# Patient Record
Sex: Male | Born: 1945 | Race: Black or African American | Hispanic: No | Marital: Single | Smoking: Former smoker
Health system: Southern US, Community
[De-identification: ages and names within clinical notes are randomized; demographics above are authoritative.]

## PROBLEM LIST (undated history)

## (undated) DIAGNOSIS — Z9289 Personal history of other medical treatment: Secondary | ICD-10-CM

## (undated) DIAGNOSIS — M1990 Unspecified osteoarthritis, unspecified site: Secondary | ICD-10-CM

## (undated) DIAGNOSIS — A419 Sepsis, unspecified organism: Secondary | ICD-10-CM

## (undated) DIAGNOSIS — N189 Chronic kidney disease, unspecified: Secondary | ICD-10-CM

## (undated) DIAGNOSIS — M069 Rheumatoid arthritis, unspecified: Secondary | ICD-10-CM

## (undated) DIAGNOSIS — D649 Anemia, unspecified: Secondary | ICD-10-CM

## (undated) DIAGNOSIS — C8310 Mantle cell lymphoma, unspecified site: Secondary | ICD-10-CM

## (undated) DIAGNOSIS — I10 Essential (primary) hypertension: Secondary | ICD-10-CM

## (undated) HISTORY — PX: FRACTURE SURGERY: SHX138

## (undated) HISTORY — PX: PORTACATH PLACEMENT: SHX2246

## (undated) HISTORY — PX: BACK SURGERY: SHX140

---

## 2009-05-21 ENCOUNTER — Emergency Department: Payer: Self-pay | Admitting: Unknown Physician Specialty

## 2012-04-27 ENCOUNTER — Ambulatory Visit: Payer: Self-pay | Admitting: Specialist

## 2012-11-03 ENCOUNTER — Emergency Department: Payer: Self-pay | Admitting: Unknown Physician Specialty

## 2012-11-03 LAB — CBC
HCT: 34.8 % — ABNORMAL LOW (ref 40.0–52.0)
HGB: 12.1 g/dL — ABNORMAL LOW (ref 13.0–18.0)
MCHC: 34.7 g/dL (ref 32.0–36.0)
Platelet: 299 10*3/uL (ref 150–440)
RBC: 4.07 10*6/uL — ABNORMAL LOW (ref 4.40–5.90)
RDW: 19.2 % — ABNORMAL HIGH (ref 11.5–14.5)
WBC: 8.5 10*3/uL (ref 3.8–10.6)

## 2012-11-03 LAB — BASIC METABOLIC PANEL
Co2: 31 mmol/L (ref 21–32)
EGFR (African American): >60
Glucose: 110 mg/dL — ABNORMAL HIGH (ref 65–99)

## 2013-02-23 ENCOUNTER — Ambulatory Visit: Payer: Self-pay | Admitting: Family Medicine

## 2013-02-23 LAB — CREATININE, SERUM
Creatinine: 0.97 mg/dL (ref 0.60–1.30)
EGFR (Non-African Amer.): >60

## 2013-02-26 ENCOUNTER — Ambulatory Visit: Payer: Self-pay | Admitting: Hematology and Oncology

## 2013-02-26 LAB — COMPREHENSIVE METABOLIC PANEL
Albumin: 2.5 g/dL — ABNORMAL LOW (ref 3.4–5.0)
Alkaline Phosphatase: 81 U/L (ref 50–136)
Anion Gap: 9 (ref 7–16)
BUN: 12 mg/dL (ref 7–18)
Bilirubin,Total: 0.3 mg/dL (ref 0.2–1.0)
Calcium, Total: 8.4 mg/dL — ABNORMAL LOW (ref 8.5–10.1)
Co2: 31 mmol/L (ref 21–32)
Creatinine: 0.95 mg/dL (ref 0.60–1.30)
EGFR (African American): >60
Glucose: 83 mg/dL (ref 65–99)
SGOT(AST): 19 U/L (ref 15–37)
SGPT (ALT): 12 U/L (ref 12–78)
Sodium: 139 mmol/L (ref 136–145)
Total Protein: 9.3 g/dL — ABNORMAL HIGH (ref 6.4–8.2)

## 2013-02-26 LAB — CBC CANCER CENTER
HGB: 10.2 g/dL — ABNORMAL LOW (ref 13.0–18.0)
Lymphocytes: 27 %
MCH: 26.8 pg (ref 26.0–34.0)
MCHC: 33.2 g/dL (ref 32.0–36.0)
Segmented Neutrophils: 38 %

## 2013-02-26 LAB — LACTATE DEHYDROGENASE: LDH: 217 U/L (ref 85–241)

## 2013-02-28 ENCOUNTER — Ambulatory Visit: Payer: Self-pay | Admitting: Hematology and Oncology

## 2013-03-08 ENCOUNTER — Ambulatory Visit: Payer: Self-pay | Admitting: Vascular Surgery

## 2013-03-13 ENCOUNTER — Ambulatory Visit: Payer: Self-pay | Admitting: Unknown Physician Specialty

## 2013-03-14 ENCOUNTER — Ambulatory Visit: Payer: Self-pay | Admitting: Hematology and Oncology

## 2013-03-15 LAB — CBC CANCER CENTER
Basophil %: 0.2 %
Eosinophil #: 0.2 x10 3/mm (ref 0.0–0.7)
Eosinophil %: 2.5 %
HCT: 31.2 % — ABNORMAL LOW (ref 40.0–52.0)
Lymphocyte %: 53.4 %
MCH: 28.2 pg (ref 26.0–34.0)
MCHC: 33.7 g/dL (ref 32.0–36.0)
MCV: 84 fL (ref 80–100)
Monocyte #: 0.7 x10 3/mm (ref 0.2–1.0)
Monocyte %: 7.8 %
Neutrophil %: 36.1 %
Platelet: 111 x10 3/mm — ABNORMAL LOW (ref 150–440)

## 2013-03-15 LAB — COMPREHENSIVE METABOLIC PANEL
Albumin: 2.5 g/dL — ABNORMAL LOW (ref 3.4–5.0)
Alkaline Phosphatase: 86 U/L (ref 50–136)
Bilirubin,Total: 0.4 mg/dL (ref 0.2–1.0)
Chloride: 101 mmol/L (ref 98–107)
Co2: 29 mmol/L (ref 21–32)
EGFR (African American): >60
EGFR (Non-African Amer.): >60
Osmolality: 272 (ref 275–301)
SGOT(AST): 19 U/L (ref 15–37)
Sodium: 137 mmol/L (ref 136–145)
Total Protein: 8.1 g/dL (ref 6.4–8.2)

## 2013-03-27 LAB — CBC CANCER CENTER
Basophil #: 0.1 x10 3/mm (ref 0.0–0.1)
Basophil %: 0.8 %
Eosinophil #: 0.7 x10 3/mm (ref 0.0–0.7)
Eosinophil %: 9 %
HCT: 32.1 % — ABNORMAL LOW (ref 40.0–52.0)
HGB: 10.9 g/dL — ABNORMAL LOW (ref 13.0–18.0)
Lymphocyte #: 0.8 x10 3/mm — ABNORMAL LOW (ref 1.0–3.6)
Lymphocyte %: 11.3 %
MCH: 28.5 pg (ref 26.0–34.0)
MCHC: 34 g/dL (ref 32.0–36.0)
MCV: 84 fL (ref 80–100)
Monocyte #: 1.3 x10 3/mm — ABNORMAL HIGH (ref 0.2–1.0)
Monocyte %: 17.3 %
Neutrophil #: 4.6 x10 3/mm (ref 1.4–6.5)
Neutrophil %: 61.6 %
Platelet: 228 x10 3/mm (ref 150–440)
RBC: 3.83 10*6/uL — ABNORMAL LOW (ref 4.40–5.90)
RDW: 19.1 % — ABNORMAL HIGH (ref 11.5–14.5)
WBC: 7.4 x10 3/mm (ref 3.8–10.6)

## 2013-04-05 LAB — COMPREHENSIVE METABOLIC PANEL
Alkaline Phosphatase: 102 U/L (ref 50–136)
Anion Gap: 8 (ref 7–16)
Calcium, Total: 8.2 mg/dL — ABNORMAL LOW (ref 8.5–10.1)
Chloride: 102 mmol/L (ref 98–107)
Co2: 30 mmol/L (ref 21–32)
EGFR (African American): >60
Glucose: 111 mg/dL — ABNORMAL HIGH (ref 65–99)
Potassium: 3.3 mmol/L — ABNORMAL LOW (ref 3.5–5.1)
SGOT(AST): 16 U/L (ref 15–37)
SGPT (ALT): 16 U/L (ref 12–78)
Total Protein: 7.8 g/dL (ref 6.4–8.2)

## 2013-04-05 LAB — CBC CANCER CENTER
Basophil #: 0.1 x10 3/mm (ref 0.0–0.1)
Basophil %: 0.9 %
Eosinophil #: 0.8 x10 3/mm — ABNORMAL HIGH (ref 0.0–0.7)
Eosinophil %: 10.7 %
HGB: 11.1 g/dL — ABNORMAL LOW (ref 13.0–18.0)
Lymphocyte #: 1.1 x10 3/mm (ref 1.0–3.6)
Lymphocyte %: 15.4 %
MCHC: 34.1 g/dL (ref 32.0–36.0)
MCV: 84 fL (ref 80–100)
Monocyte #: 1.1 x10 3/mm — ABNORMAL HIGH (ref 0.2–1.0)
Monocyte %: 15.9 %
Neutrophil #: 4 x10 3/mm (ref 1.4–6.5)
Neutrophil %: 57.1 %
Platelet: 179 x10 3/mm (ref 150–440)
RDW: 18.1 % — ABNORMAL HIGH (ref 11.5–14.5)
WBC: 7.1 x10 3/mm (ref 3.8–10.6)

## 2013-04-14 ENCOUNTER — Ambulatory Visit: Payer: Self-pay | Admitting: Hematology and Oncology

## 2013-04-23 ENCOUNTER — Emergency Department: Payer: Self-pay | Admitting: Emergency Medicine

## 2013-04-23 LAB — CBC
HCT: 31.4 % — ABNORMAL LOW (ref 40.0–52.0)
HGB: 10.8 g/dL — ABNORMAL LOW (ref 13.0–18.0)
MCHC: 34.4 g/dL (ref 32.0–36.0)
MCV: 84 fL (ref 80–100)
Platelet: 230 10*3/uL (ref 150–440)
RBC: 3.75 10*6/uL — ABNORMAL LOW (ref 4.40–5.90)
WBC: 4.4 10*3/uL (ref 3.8–10.6)

## 2013-04-23 LAB — CK TOTAL AND CKMB (NOT AT ARMC): CK-MB: 1 ng/mL (ref 0.5–3.6)

## 2013-04-23 LAB — COMPREHENSIVE METABOLIC PANEL
Albumin: 2.4 g/dL — ABNORMAL LOW (ref 3.4–5.0)
BUN: 7 mg/dL (ref 7–18)
Calcium, Total: 8.6 mg/dL (ref 8.5–10.1)
Chloride: 99 mmol/L (ref 98–107)
Co2: 34 mmol/L — ABNORMAL HIGH (ref 21–32)
Creatinine: 0.91 mg/dL (ref 0.60–1.30)
EGFR (African American): >60
Glucose: 102 mg/dL — ABNORMAL HIGH (ref 65–99)
Potassium: 2.7 mmol/L — ABNORMAL LOW (ref 3.5–5.1)
Sodium: 137 mmol/L (ref 136–145)
Total Protein: 7.9 g/dL (ref 6.4–8.2)

## 2013-04-23 LAB — TROPONIN I: Troponin-I: <0.02 ng/mL

## 2013-04-23 LAB — URINALYSIS, COMPLETE
Bacteria: NONE SEEN
Glucose,UR: NEGATIVE mg/dL (ref 0–75)
Ketone: NEGATIVE
Leukocyte Esterase: NEGATIVE
RBC,UR: 1 /HPF (ref 0–5)
Specific Gravity: 1.005 (ref 1.003–1.030)
Squamous Epithelial: 1
WBC UR: 4 /HPF (ref 0–5)

## 2013-05-03 LAB — CBC CANCER CENTER
Basophil #: 0 x10 3/mm (ref 0.0–0.1)
Basophil %: 1 %
Eosinophil #: 0.2 x10 3/mm (ref 0.0–0.7)
MCH: 27.8 pg (ref 26.0–34.0)
MCHC: 33.9 g/dL (ref 32.0–36.0)
MCV: 82 fL (ref 80–100)
Monocyte %: 25.1 %
Neutrophil #: 1.9 x10 3/mm (ref 1.4–6.5)
Neutrophil %: 39.3 %
Platelet: 225 x10 3/mm (ref 150–440)
RDW: 18 % — ABNORMAL HIGH (ref 11.5–14.5)

## 2013-05-03 LAB — COMPREHENSIVE METABOLIC PANEL
Alkaline Phosphatase: 90 U/L (ref 50–136)
Co2: 35 mmol/L — ABNORMAL HIGH (ref 21–32)
EGFR (African American): >60
EGFR (Non-African Amer.): >60
Glucose: 143 mg/dL — ABNORMAL HIGH (ref 65–99)
Osmolality: 301 (ref 275–301)
Sodium: 150 mmol/L — ABNORMAL HIGH (ref 136–145)

## 2013-05-03 LAB — MAGNESIUM: Magnesium: 2.1 mg/dL

## 2013-05-14 ENCOUNTER — Ambulatory Visit: Payer: Self-pay | Admitting: Hematology and Oncology

## 2013-05-24 LAB — CBC CANCER CENTER
Basophil #: 0.1 x10 3/mm (ref 0.0–0.1)
Basophil %: 1 %
Eosinophil #: 0.7 x10 3/mm (ref 0.0–0.7)
Eosinophil %: 10.5 %
HCT: 34 % — ABNORMAL LOW (ref 40.0–52.0)
HGB: 11.4 g/dL — ABNORMAL LOW (ref 13.0–18.0)
Lymphocyte %: 26.8 %
MCV: 86 fL (ref 80–100)
Monocyte #: 1.4 x10 3/mm — ABNORMAL HIGH (ref 0.2–1.0)
Neutrophil #: 2.9 x10 3/mm (ref 1.4–6.5)
Platelet: 182 x10 3/mm (ref 150–440)
RBC: 3.96 10*6/uL — ABNORMAL LOW (ref 4.40–5.90)
RDW: 19.2 % — ABNORMAL HIGH (ref 11.5–14.5)
WBC: 6.9 x10 3/mm (ref 3.8–10.6)

## 2013-05-24 LAB — COMPREHENSIVE METABOLIC PANEL
Anion Gap: 6 — ABNORMAL LOW (ref 7–16)
Calcium, Total: 9 mg/dL (ref 8.5–10.1)
Chloride: 103 mmol/L (ref 98–107)
Co2: 29 mmol/L (ref 21–32)
Creatinine: 0.98 mg/dL (ref 0.60–1.30)
EGFR (African American): >60
EGFR (Non-African Amer.): >60
Osmolality: 277 (ref 275–301)
Potassium: 4.1 mmol/L (ref 3.5–5.1)
Sodium: 138 mmol/L (ref 136–145)
Total Protein: 7.9 g/dL (ref 6.4–8.2)

## 2013-06-14 ENCOUNTER — Ambulatory Visit: Payer: Self-pay | Admitting: Hematology and Oncology

## 2013-06-21 LAB — COMPREHENSIVE METABOLIC PANEL
ALK PHOS: 75 U/L
Albumin: 2.9 g/dL — ABNORMAL LOW (ref 3.4–5.0)
Anion Gap: 7 (ref 7–16)
BUN: 6 mg/dL — ABNORMAL LOW (ref 7–18)
Bilirubin,Total: 0.3 mg/dL (ref 0.2–1.0)
CALCIUM: 8.8 mg/dL (ref 8.5–10.1)
CO2: 27 mmol/L (ref 21–32)
Chloride: 103 mmol/L (ref 98–107)
Creatinine: 0.92 mg/dL (ref 0.60–1.30)
EGFR (African American): >60
Glucose: 105 mg/dL — ABNORMAL HIGH (ref 65–99)
Osmolality: 272 (ref 275–301)
Potassium: 3.7 mmol/L (ref 3.5–5.1)
SGOT(AST): 17 U/L (ref 15–37)
SGPT (ALT): 12 U/L (ref 12–78)
SODIUM: 137 mmol/L (ref 136–145)
Total Protein: 7.8 g/dL (ref 6.4–8.2)

## 2013-06-21 LAB — CBC CANCER CENTER
Basophil #: 0.1 x10 3/mm (ref 0.0–0.1)
Basophil %: 0.9 %
Eosinophil #: 0.4 x10 3/mm (ref 0.0–0.7)
Eosinophil %: 6.1 %
HCT: 35.2 % — ABNORMAL LOW (ref 40.0–52.0)
HGB: 11.7 g/dL — ABNORMAL LOW (ref 13.0–18.0)
LYMPHS PCT: 28 %
Lymphocyte #: 1.6 x10 3/mm (ref 1.0–3.6)
MCH: 28.9 pg (ref 26.0–34.0)
MCHC: 33.1 g/dL (ref 32.0–36.0)
MCV: 87 fL (ref 80–100)
MONO ABS: 0.5 x10 3/mm (ref 0.2–1.0)
Monocyte %: 8.2 %
NEUTROS PCT: 56.8 %
Neutrophil #: 3.3 x10 3/mm (ref 1.4–6.5)
Platelet: 143 x10 3/mm — ABNORMAL LOW (ref 150–440)
RBC: 4.03 10*6/uL — ABNORMAL LOW (ref 4.40–5.90)
RDW: 17.9 % — AB (ref 11.5–14.5)
WBC: 5.8 x10 3/mm (ref 3.8–10.6)

## 2013-07-12 LAB — COMPREHENSIVE METABOLIC PANEL
Albumin: 2.9 g/dL — ABNORMAL LOW (ref 3.4–5.0)
Alkaline Phosphatase: 74 U/L
Anion Gap: 8 (ref 7–16)
BILIRUBIN TOTAL: 0.2 mg/dL (ref 0.2–1.0)
BUN: 11 mg/dL (ref 7–18)
CALCIUM: 8.3 mg/dL — AB (ref 8.5–10.1)
CREATININE: 1.02 mg/dL (ref 0.60–1.30)
Chloride: 103 mmol/L (ref 98–107)
Co2: 27 mmol/L (ref 21–32)
EGFR (African American): >60
EGFR (Non-African Amer.): >60
Glucose: 87 mg/dL (ref 65–99)
OSMOLALITY: 274 (ref 275–301)
Potassium: 4 mmol/L (ref 3.5–5.1)
SGOT(AST): 15 U/L (ref 15–37)
SGPT (ALT): 11 U/L — ABNORMAL LOW (ref 12–78)
Sodium: 138 mmol/L (ref 136–145)
Total Protein: 8.1 g/dL (ref 6.4–8.2)

## 2013-07-12 LAB — CBC CANCER CENTER
Basophil #: 0 x10 3/mm (ref 0.0–0.1)
Basophil %: 0.7 %
EOS ABS: 0.5 x10 3/mm (ref 0.0–0.7)
EOS PCT: 7.8 %
HCT: 34.5 % — ABNORMAL LOW (ref 40.0–52.0)
HGB: 11.6 g/dL — ABNORMAL LOW (ref 13.0–18.0)
LYMPHS ABS: 1.8 x10 3/mm (ref 1.0–3.6)
Lymphocyte %: 26.2 %
MCH: 29.1 pg (ref 26.0–34.0)
MCHC: 33.6 g/dL (ref 32.0–36.0)
MCV: 87 fL (ref 80–100)
Monocyte #: 1.3 x10 3/mm — ABNORMAL HIGH (ref 0.2–1.0)
Monocyte %: 19.1 %
NEUTROS ABS: 3.1 x10 3/mm (ref 1.4–6.5)
NEUTROS PCT: 46.2 %
Platelet: 142 x10 3/mm — ABNORMAL LOW (ref 150–440)
RBC: 3.99 10*6/uL — ABNORMAL LOW (ref 4.40–5.90)
RDW: 17.7 % — AB (ref 11.5–14.5)
WBC: 6.8 x10 3/mm (ref 3.8–10.6)

## 2013-07-15 ENCOUNTER — Ambulatory Visit: Payer: Self-pay | Admitting: Hematology and Oncology

## 2013-08-02 LAB — COMPREHENSIVE METABOLIC PANEL
ALBUMIN: 3.3 g/dL — AB (ref 3.4–5.0)
ALK PHOS: 75 U/L
ANION GAP: 8 (ref 7–16)
BILIRUBIN TOTAL: 0.2 mg/dL (ref 0.2–1.0)
BUN: 15 mg/dL (ref 7–18)
CO2: 28 mmol/L (ref 21–32)
Calcium, Total: 8.2 mg/dL — ABNORMAL LOW (ref 8.5–10.1)
Chloride: 102 mmol/L (ref 98–107)
Creatinine: 0.99 mg/dL (ref 0.60–1.30)
GLUCOSE: 112 mg/dL — AB (ref 65–99)
Osmolality: 277 (ref 275–301)
Potassium: 4.2 mmol/L (ref 3.5–5.1)
SGOT(AST): 16 U/L (ref 15–37)
SGPT (ALT): 14 U/L (ref 12–78)
Sodium: 138 mmol/L (ref 136–145)
Total Protein: 7.9 g/dL (ref 6.4–8.2)

## 2013-08-02 LAB — CBC CANCER CENTER
BASOS ABS: 0 x10 3/mm (ref 0.0–0.1)
Basophil %: 0.6 %
EOS ABS: 0.5 x10 3/mm (ref 0.0–0.7)
EOS PCT: 8.4 %
HCT: 36.7 % — ABNORMAL LOW (ref 40.0–52.0)
HGB: 12 g/dL — ABNORMAL LOW (ref 13.0–18.0)
LYMPHS PCT: 24.1 %
Lymphocyte #: 1.5 x10 3/mm (ref 1.0–3.6)
MCH: 28.3 pg (ref 26.0–34.0)
MCHC: 32.7 g/dL (ref 32.0–36.0)
MCV: 87 fL (ref 80–100)
MONO ABS: 1.2 x10 3/mm — AB (ref 0.2–1.0)
MONOS PCT: 18.6 %
NEUTROS ABS: 3.1 x10 3/mm (ref 1.4–6.5)
Neutrophil %: 48.3 %
PLATELETS: 127 x10 3/mm — AB (ref 150–440)
RBC: 4.24 10*6/uL — ABNORMAL LOW (ref 4.40–5.90)
RDW: 18.5 % — ABNORMAL HIGH (ref 11.5–14.5)
WBC: 6.3 x10 3/mm (ref 3.8–10.6)

## 2013-08-08 ENCOUNTER — Ambulatory Visit: Payer: Self-pay | Admitting: Hematology and Oncology

## 2013-08-12 ENCOUNTER — Ambulatory Visit: Payer: Self-pay | Admitting: Hematology and Oncology

## 2013-08-30 LAB — CBC CANCER CENTER
Basophil #: 0 x10 3/mm (ref 0.0–0.1)
Basophil %: 0.5 %
EOS PCT: 3.7 %
Eosinophil #: 0.2 x10 3/mm (ref 0.0–0.7)
HCT: 32 % — ABNORMAL LOW (ref 40.0–52.0)
HGB: 10.6 g/dL — AB (ref 13.0–18.0)
LYMPHS ABS: 1 x10 3/mm (ref 1.0–3.6)
Lymphocyte %: 23.1 %
MCH: 28.3 pg (ref 26.0–34.0)
MCHC: 33.3 g/dL (ref 32.0–36.0)
MCV: 85 fL (ref 80–100)
Monocyte #: 0.4 x10 3/mm (ref 0.2–1.0)
Monocyte %: 8.6 %
Neutrophil #: 2.8 x10 3/mm (ref 1.4–6.5)
Neutrophil %: 64.1 %
Platelet: 164 x10 3/mm (ref 150–440)
RBC: 3.76 10*6/uL — ABNORMAL LOW (ref 4.40–5.90)
RDW: 18.2 % — ABNORMAL HIGH (ref 11.5–14.5)
WBC: 4.3 x10 3/mm (ref 3.8–10.6)

## 2013-08-30 LAB — COMPREHENSIVE METABOLIC PANEL
ALBUMIN: 2.7 g/dL — AB (ref 3.4–5.0)
ANION GAP: 6 — AB (ref 7–16)
AST: 23 U/L (ref 15–37)
Alkaline Phosphatase: 71 U/L
BUN: 11 mg/dL (ref 7–18)
Bilirubin,Total: 0.2 mg/dL (ref 0.2–1.0)
CALCIUM: 8.4 mg/dL — AB (ref 8.5–10.1)
CHLORIDE: 100 mmol/L (ref 98–107)
CO2: 33 mmol/L — AB (ref 21–32)
Creatinine: 1.1 mg/dL (ref 0.60–1.30)
EGFR (Non-African Amer.): >60
Glucose: 147 mg/dL — ABNORMAL HIGH (ref 65–99)
OSMOLALITY: 280 (ref 275–301)
POTASSIUM: 2.4 mmol/L — AB (ref 3.5–5.1)
SGPT (ALT): 11 U/L — ABNORMAL LOW (ref 12–78)
Sodium: 139 mmol/L (ref 136–145)
Total Protein: 7.7 g/dL (ref 6.4–8.2)

## 2013-09-12 ENCOUNTER — Ambulatory Visit: Payer: Self-pay | Admitting: Hematology and Oncology

## 2013-10-01 LAB — CBC CANCER CENTER
BASOS ABS: 0 x10 3/mm (ref 0.0–0.1)
Basophil %: 0.7 %
Eosinophil #: 0.2 x10 3/mm (ref 0.0–0.7)
Eosinophil %: 4.6 %
HCT: 30.6 % — AB (ref 40.0–52.0)
HGB: 10.1 g/dL — AB (ref 13.0–18.0)
Lymphocyte #: 1.2 x10 3/mm (ref 1.0–3.6)
Lymphocyte %: 27.9 %
MCH: 27 pg (ref 26.0–34.0)
MCHC: 33.1 g/dL (ref 32.0–36.0)
MCV: 82 fL (ref 80–100)
MONO ABS: 0.4 x10 3/mm (ref 0.2–1.0)
MONOS PCT: 8.2 %
NEUTROS ABS: 2.6 x10 3/mm (ref 1.4–6.5)
Neutrophil %: 58.6 %
Platelet: 153 x10 3/mm (ref 150–440)
RBC: 3.75 10*6/uL — AB (ref 4.40–5.90)
RDW: 22.5 % — ABNORMAL HIGH (ref 11.5–14.5)
WBC: 4.5 x10 3/mm (ref 3.8–10.6)

## 2013-10-01 LAB — COMPREHENSIVE METABOLIC PANEL
ALBUMIN: 2.5 g/dL — AB (ref 3.4–5.0)
ALT: 10 U/L — AB (ref 12–78)
AST: 20 U/L (ref 15–37)
Alkaline Phosphatase: 69 U/L
Anion Gap: 9 (ref 7–16)
BUN: 14 mg/dL (ref 7–18)
Bilirubin,Total: 0.3 mg/dL (ref 0.2–1.0)
CHLORIDE: 100 mmol/L (ref 98–107)
CREATININE: 1.12 mg/dL (ref 0.60–1.30)
Calcium, Total: 8.8 mg/dL (ref 8.5–10.1)
Co2: 28 mmol/L (ref 21–32)
EGFR (African American): >60
Glucose: 154 mg/dL — ABNORMAL HIGH (ref 65–99)
Osmolality: 277 (ref 275–301)
POTASSIUM: 3.8 mmol/L (ref 3.5–5.1)
Sodium: 137 mmol/L (ref 136–145)
Total Protein: 8 g/dL (ref 6.4–8.2)

## 2013-10-12 ENCOUNTER — Ambulatory Visit: Payer: Self-pay | Admitting: Hematology and Oncology

## 2013-10-17 LAB — CBC CANCER CENTER
BASOS ABS: 0 x10 3/mm (ref 0.0–0.1)
Basophil %: 0.3 %
Eosinophil #: 0.3 x10 3/mm (ref 0.0–0.7)
Eosinophil %: 5.8 %
HCT: 30.2 % — ABNORMAL LOW (ref 40.0–52.0)
HGB: 10.1 g/dL — ABNORMAL LOW (ref 13.0–18.0)
LYMPHS PCT: 35.3 %
Lymphocyte #: 1.8 x10 3/mm (ref 1.0–3.6)
MCH: 26.7 pg (ref 26.0–34.0)
MCHC: 33.4 g/dL (ref 32.0–36.0)
MCV: 80 fL (ref 80–100)
Monocyte #: 0.7 x10 3/mm (ref 0.2–1.0)
Monocyte %: 14.2 %
Neutrophil #: 2.3 x10 3/mm (ref 1.4–6.5)
Neutrophil %: 44.4 %
PLATELETS: 103 x10 3/mm — AB (ref 150–440)
RBC: 3.77 10*6/uL — ABNORMAL LOW (ref 4.40–5.90)
RDW: 23 % — AB (ref 11.5–14.5)
WBC: 5.2 x10 3/mm (ref 3.8–10.6)

## 2013-10-17 LAB — LACTATE DEHYDROGENASE: LDH: 217 U/L (ref 85–241)

## 2013-11-06 LAB — CBC CANCER CENTER
BASOS PCT: 0.6 %
Basophil #: 0 x10 3/mm (ref 0.0–0.1)
EOS PCT: 3 %
Eosinophil #: 0.1 x10 3/mm (ref 0.0–0.7)
HCT: 30.6 % — ABNORMAL LOW (ref 40.0–52.0)
HGB: 9.9 g/dL — ABNORMAL LOW (ref 13.0–18.0)
LYMPHS ABS: 1.5 x10 3/mm (ref 1.0–3.6)
LYMPHS PCT: 33.7 %
MCH: 26 pg (ref 26.0–34.0)
MCHC: 32.3 g/dL (ref 32.0–36.0)
MCV: 81 fL (ref 80–100)
Monocyte #: 0.9 x10 3/mm (ref 0.2–1.0)
Monocyte %: 19.6 %
NEUTROS ABS: 1.9 x10 3/mm (ref 1.4–6.5)
NEUTROS PCT: 43.1 %
Platelet: 165 x10 3/mm (ref 150–440)
RBC: 3.79 10*6/uL — ABNORMAL LOW (ref 4.40–5.90)
RDW: 25 % — AB (ref 11.5–14.5)
WBC: 4.5 x10 3/mm (ref 3.8–10.6)

## 2013-11-06 LAB — COMPREHENSIVE METABOLIC PANEL
ALT: 12 U/L (ref 12–78)
Albumin: 2.3 g/dL — ABNORMAL LOW (ref 3.4–5.0)
Alkaline Phosphatase: 73 U/L
Anion Gap: 7 (ref 7–16)
BILIRUBIN TOTAL: 0.3 mg/dL (ref 0.2–1.0)
BUN: 11 mg/dL (ref 7–18)
CALCIUM: 8.2 mg/dL — AB (ref 8.5–10.1)
Chloride: 103 mmol/L (ref 98–107)
Co2: 29 mmol/L (ref 21–32)
Creatinine: 1.11 mg/dL (ref 0.60–1.30)
Glucose: 104 mg/dL — ABNORMAL HIGH (ref 65–99)
Osmolality: 277 (ref 275–301)
POTASSIUM: 4.8 mmol/L (ref 3.5–5.1)
SGOT(AST): 12 U/L — ABNORMAL LOW (ref 15–37)
Sodium: 139 mmol/L (ref 136–145)
Total Protein: 6.7 g/dL (ref 6.4–8.2)

## 2013-11-12 ENCOUNTER — Ambulatory Visit: Payer: Self-pay | Admitting: Hematology and Oncology

## 2013-11-12 ENCOUNTER — Observation Stay: Payer: Self-pay | Admitting: Internal Medicine

## 2013-11-12 LAB — URINALYSIS, COMPLETE
BLOOD: NEGATIVE
Bacteria: NONE SEEN
Bilirubin,UR: NEGATIVE
Glucose,UR: NEGATIVE mg/dL (ref 0–75)
Hyaline Cast: 3
KETONE: NEGATIVE
NITRITE: NEGATIVE
Ph: 5 (ref 4.5–8.0)
Protein: 30
SPECIFIC GRAVITY: 1.012 (ref 1.003–1.030)
Squamous Epithelial: 1
WBC UR: 6 /HPF (ref 0–5)

## 2013-11-12 LAB — COMPREHENSIVE METABOLIC PANEL
ALBUMIN: 2.2 g/dL — AB (ref 3.4–5.0)
ALT: 8 U/L — AB (ref 12–78)
ANION GAP: 7 (ref 7–16)
AST: 14 U/L — AB (ref 15–37)
Alkaline Phosphatase: 74 U/L
BILIRUBIN TOTAL: 0.4 mg/dL (ref 0.2–1.0)
BUN: 12 mg/dL (ref 7–18)
CHLORIDE: 105 mmol/L (ref 98–107)
CO2: 24 mmol/L (ref 21–32)
CREATININE: 1.1 mg/dL (ref 0.60–1.30)
Calcium, Total: 8.1 mg/dL — ABNORMAL LOW (ref 8.5–10.1)
EGFR (Non-African Amer.): >60
Glucose: 91 mg/dL (ref 65–99)
Osmolality: 271 (ref 275–301)
POTASSIUM: 4.4 mmol/L (ref 3.5–5.1)
SODIUM: 136 mmol/L (ref 136–145)
Total Protein: 6.2 g/dL — ABNORMAL LOW (ref 6.4–8.2)

## 2013-11-12 LAB — TROPONIN I
Troponin-I: <0.02 ng/mL
Troponin-I: <0.02 ng/mL
Troponin-I: <0.02 ng/mL

## 2013-11-12 LAB — CBC
HCT: 29.6 % — ABNORMAL LOW (ref 40.0–52.0)
HGB: 9.6 g/dL — ABNORMAL LOW (ref 13.0–18.0)
MCH: 26.3 pg (ref 26.0–34.0)
MCHC: 32.5 g/dL (ref 32.0–36.0)
MCV: 81 fL (ref 80–100)
PLATELETS: 185 10*3/uL (ref 150–440)
RBC: 3.66 10*6/uL — ABNORMAL LOW (ref 4.40–5.90)
RDW: 24.5 % — AB (ref 11.5–14.5)
WBC: 5.8 10*3/uL (ref 3.8–10.6)

## 2013-11-12 LAB — PRO B NATRIURETIC PEPTIDE: B-Type Natriuretic Peptide: 40 pg/mL (ref 0–125)

## 2013-11-13 LAB — CBC WITH DIFFERENTIAL/PLATELET
BASOS ABS: 0 10*3/uL (ref 0.0–0.1)
Basophil %: 0.4 %
Eosinophil #: 0 10*3/uL (ref 0.0–0.7)
Eosinophil %: 0 %
HCT: 28.3 % — AB (ref 40.0–52.0)
HGB: 9.1 g/dL — AB (ref 13.0–18.0)
Lymphocyte #: 0.9 10*3/uL — ABNORMAL LOW (ref 1.0–3.6)
Lymphocyte %: 11.8 %
MCH: 25.9 pg — ABNORMAL LOW (ref 26.0–34.0)
MCHC: 32 g/dL (ref 32.0–36.0)
MCV: 81 fL (ref 80–100)
MONO ABS: 0.4 x10 3/mm (ref 0.2–1.0)
MONOS PCT: 4.7 %
NEUTROS PCT: 83.1 %
Neutrophil #: 6.5 10*3/uL (ref 1.4–6.5)
PLATELETS: 192 10*3/uL (ref 150–440)
RBC: 3.5 10*6/uL — ABNORMAL LOW (ref 4.40–5.90)
RDW: 24.5 % — ABNORMAL HIGH (ref 11.5–14.5)
WBC: 7.8 10*3/uL (ref 3.8–10.6)

## 2013-11-13 LAB — LIPID PANEL
Cholesterol: 137 mg/dL (ref 0–200)
HDL: 35 mg/dL — AB (ref 40–60)
Ldl Cholesterol, Calc: 88 mg/dL (ref 0–100)
TRIGLYCERIDES: 70 mg/dL (ref 0–200)
VLDL CHOLESTEROL, CALC: 14 mg/dL (ref 5–40)

## 2013-11-13 LAB — BASIC METABOLIC PANEL
ANION GAP: 8 (ref 7–16)
BUN: 16 mg/dL (ref 7–18)
CHLORIDE: 106 mmol/L (ref 98–107)
CREATININE: 0.92 mg/dL (ref 0.60–1.30)
Calcium, Total: 8.4 mg/dL — ABNORMAL LOW (ref 8.5–10.1)
Co2: 25 mmol/L (ref 21–32)
EGFR (African American): >60
EGFR (Non-African Amer.): >60
GLUCOSE: 135 mg/dL — AB (ref 65–99)
Osmolality: 281 (ref 275–301)
Potassium: 4.3 mmol/L (ref 3.5–5.1)
SODIUM: 139 mmol/L (ref 136–145)

## 2013-11-20 LAB — BASIC METABOLIC PANEL
ANION GAP: 7 (ref 7–16)
BUN: 17 mg/dL (ref 7–18)
CREATININE: 1.05 mg/dL (ref 0.60–1.30)
Calcium, Total: 8.3 mg/dL — ABNORMAL LOW (ref 8.5–10.1)
Chloride: 105 mmol/L (ref 98–107)
Co2: 28 mmol/L (ref 21–32)
EGFR (Non-African Amer.): >60
GLUCOSE: 145 mg/dL — AB (ref 65–99)
Osmolality: 284 (ref 275–301)
Potassium: 3.4 mmol/L — ABNORMAL LOW (ref 3.5–5.1)
SODIUM: 140 mmol/L (ref 136–145)

## 2013-11-20 LAB — CBC CANCER CENTER
BASOS ABS: 0.1 x10 3/mm (ref 0.0–0.1)
BASOS PCT: 1.6 %
EOS ABS: 0.5 x10 3/mm (ref 0.0–0.7)
Eosinophil %: 5.8 %
HCT: 30.5 % — AB (ref 40.0–52.0)
HGB: 9.8 g/dL — ABNORMAL LOW (ref 13.0–18.0)
LYMPHS PCT: 25.5 %
Lymphocyte #: 2 x10 3/mm (ref 1.0–3.6)
MCH: 25.9 pg — ABNORMAL LOW (ref 26.0–34.0)
MCHC: 32.2 g/dL (ref 32.0–36.0)
MCV: 80 fL (ref 80–100)
Monocyte #: 1 x10 3/mm (ref 0.2–1.0)
Monocyte %: 12.8 %
NEUTROS ABS: 4.3 x10 3/mm (ref 1.4–6.5)
Neutrophil %: 54.3 %
Platelet: 224 x10 3/mm (ref 150–440)
RBC: 3.79 10*6/uL — ABNORMAL LOW (ref 4.40–5.90)
RDW: 24.4 % — ABNORMAL HIGH (ref 11.5–14.5)
WBC: 8 x10 3/mm (ref 3.8–10.6)

## 2013-12-03 LAB — CBC CANCER CENTER
BASOS PCT: 0.9 %
Basophil #: 0 x10 3/mm (ref 0.0–0.1)
EOS ABS: 0.1 x10 3/mm (ref 0.0–0.7)
EOS PCT: 4.3 %
HCT: 30.6 % — AB (ref 40.0–52.0)
HGB: 10 g/dL — AB (ref 13.0–18.0)
LYMPHS ABS: 1.6 x10 3/mm (ref 1.0–3.6)
Lymphocyte %: 59.9 %
MCH: 26.7 pg (ref 26.0–34.0)
MCHC: 32.5 g/dL (ref 32.0–36.0)
MCV: 82 fL (ref 80–100)
Monocyte #: 0.7 x10 3/mm (ref 0.2–1.0)
Monocyte %: 25 %
NEUTROS ABS: 0.3 x10 3/mm — AB (ref 1.4–6.5)
Neutrophil %: 9.9 %
PLATELETS: 194 x10 3/mm (ref 150–440)
RBC: 3.74 10*6/uL — AB (ref 4.40–5.90)
RDW: 24.8 % — AB (ref 11.5–14.5)
WBC: 2.6 x10 3/mm — AB (ref 3.8–10.6)

## 2013-12-03 LAB — COMPREHENSIVE METABOLIC PANEL
ALBUMIN: 3 g/dL — AB (ref 3.4–5.0)
ALK PHOS: 81 U/L
ANION GAP: 9 (ref 7–16)
BUN: 11 mg/dL (ref 7–18)
Bilirubin,Total: 0.2 mg/dL (ref 0.2–1.0)
CALCIUM: 8.6 mg/dL (ref 8.5–10.1)
CHLORIDE: 105 mmol/L (ref 98–107)
CO2: 28 mmol/L (ref 21–32)
CREATININE: 1.11 mg/dL (ref 0.60–1.30)
EGFR (Non-African Amer.): >60
GLUCOSE: 108 mg/dL — AB (ref 65–99)
OSMOLALITY: 283 (ref 275–301)
POTASSIUM: 3.8 mmol/L (ref 3.5–5.1)
SGOT(AST): 12 U/L — ABNORMAL LOW (ref 15–37)
SGPT (ALT): 16 U/L (ref 12–78)
Sodium: 142 mmol/L (ref 136–145)
TOTAL PROTEIN: 6.9 g/dL (ref 6.4–8.2)

## 2013-12-10 LAB — COMPREHENSIVE METABOLIC PANEL
ALT: 16 U/L (ref 12–78)
Albumin: 3 g/dL — ABNORMAL LOW (ref 3.4–5.0)
Alkaline Phosphatase: 73 U/L
Anion Gap: 2 — ABNORMAL LOW (ref 7–16)
BILIRUBIN TOTAL: 0.2 mg/dL (ref 0.2–1.0)
BUN: 8 mg/dL (ref 7–18)
CALCIUM: 8.7 mg/dL (ref 8.5–10.1)
CHLORIDE: 104 mmol/L (ref 98–107)
Co2: 30 mmol/L (ref 21–32)
Creatinine: 1.16 mg/dL (ref 0.60–1.30)
EGFR (Non-African Amer.): >60
GLUCOSE: 90 mg/dL (ref 65–99)
OSMOLALITY: 270 (ref 275–301)
Potassium: 3.9 mmol/L (ref 3.5–5.1)
SGOT(AST): 14 U/L — ABNORMAL LOW (ref 15–37)
Sodium: 136 mmol/L (ref 136–145)
Total Protein: 6.8 g/dL (ref 6.4–8.2)

## 2013-12-10 LAB — CBC CANCER CENTER
Basophil #: 0 x10 3/mm (ref 0.0–0.1)
Basophil %: 0.4 %
EOS PCT: 0.9 %
Eosinophil #: 0.1 x10 3/mm (ref 0.0–0.7)
HCT: 35.1 % — ABNORMAL LOW (ref 40.0–52.0)
HGB: 11.4 g/dL — ABNORMAL LOW (ref 13.0–18.0)
LYMPHS ABS: 4.1 x10 3/mm — AB (ref 1.0–3.6)
Lymphocyte %: 53.8 %
MCH: 27.1 pg (ref 26.0–34.0)
MCHC: 32.5 g/dL (ref 32.0–36.0)
MCV: 84 fL (ref 80–100)
Monocyte #: 1.7 x10 3/mm — ABNORMAL HIGH (ref 0.2–1.0)
Monocyte %: 22.1 %
Neutrophil #: 1.7 x10 3/mm (ref 1.4–6.5)
Neutrophil %: 22.8 %
Platelet: 277 x10 3/mm (ref 150–440)
RBC: 4.2 10*6/uL — AB (ref 4.40–5.90)
RDW: 27 % — ABNORMAL HIGH (ref 11.5–14.5)
WBC: 7.6 x10 3/mm (ref 3.8–10.6)

## 2013-12-12 ENCOUNTER — Ambulatory Visit: Payer: Self-pay | Admitting: Hematology and Oncology

## 2013-12-17 LAB — CBC CANCER CENTER
BASOS ABS: 0 x10 3/mm (ref 0.0–0.1)
Basophil %: 0.7 %
Eosinophil #: 0.1 x10 3/mm (ref 0.0–0.7)
Eosinophil %: 1.4 %
HCT: 31.8 % — ABNORMAL LOW (ref 40.0–52.0)
HGB: 10.4 g/dL — ABNORMAL LOW (ref 13.0–18.0)
LYMPHS ABS: 2 x10 3/mm (ref 1.0–3.6)
Lymphocyte %: 47.3 %
MCH: 27.2 pg (ref 26.0–34.0)
MCHC: 32.6 g/dL (ref 32.0–36.0)
MCV: 83 fL (ref 80–100)
MONO ABS: 0.1 x10 3/mm — AB (ref 0.2–1.0)
MONOS PCT: 1.5 %
NEUTROS PCT: 49.1 %
Neutrophil #: 2.1 x10 3/mm (ref 1.4–6.5)
PLATELETS: 128 x10 3/mm — AB (ref 150–440)
RBC: 3.81 10*6/uL — ABNORMAL LOW (ref 4.40–5.90)
RDW: 24.9 % — AB (ref 11.5–14.5)
WBC: 4.3 x10 3/mm (ref 3.8–10.6)

## 2013-12-24 LAB — CBC CANCER CENTER
Basophil #: 0 x10 3/mm (ref 0.0–0.1)
Basophil %: 1.2 %
Eosinophil #: 0.1 x10 3/mm (ref 0.0–0.7)
Eosinophil %: 3.8 %
HCT: 33.2 % — ABNORMAL LOW (ref 40.0–52.0)
HGB: 10.8 g/dL — AB (ref 13.0–18.0)
Lymphocyte #: 1.7 x10 3/mm (ref 1.0–3.6)
Lymphocyte %: 50.6 %
MCH: 27.2 pg (ref 26.0–34.0)
MCHC: 32.6 g/dL (ref 32.0–36.0)
MCV: 84 fL (ref 80–100)
MONO ABS: 1 x10 3/mm (ref 0.2–1.0)
Monocyte %: 31.1 %
Neutrophil #: 0.4 x10 3/mm — ABNORMAL LOW (ref 1.4–6.5)
Neutrophil %: 13.3 %
Platelet: 82 x10 3/mm — ABNORMAL LOW (ref 150–440)
RBC: 3.98 10*6/uL — ABNORMAL LOW (ref 4.40–5.90)
RDW: 24.1 % — ABNORMAL HIGH (ref 11.5–14.5)
WBC: 3.3 x10 3/mm — ABNORMAL LOW (ref 3.8–10.6)

## 2013-12-31 LAB — CBC CANCER CENTER
Basophil #: 0.1 x10 3/mm (ref 0.0–0.1)
Basophil %: 1.4 %
Eosinophil #: 0 x10 3/mm (ref 0.0–0.7)
Eosinophil %: 0.5 %
HCT: 31.6 % — AB (ref 40.0–52.0)
HGB: 10.2 g/dL — AB (ref 13.0–18.0)
LYMPHS ABS: 2.1 x10 3/mm (ref 1.0–3.6)
LYMPHS PCT: 33 %
MCH: 27.4 pg (ref 26.0–34.0)
MCHC: 32.2 g/dL (ref 32.0–36.0)
MCV: 85 fL (ref 80–100)
MONOS PCT: 28 %
Monocyte #: 1.8 x10 3/mm — ABNORMAL HIGH (ref 0.2–1.0)
NEUTROS PCT: 37.1 %
Neutrophil #: 2.4 x10 3/mm (ref 1.4–6.5)
Platelet: 218 x10 3/mm (ref 150–440)
RBC: 3.71 10*6/uL — ABNORMAL LOW (ref 4.40–5.90)
RDW: 23.2 % — AB (ref 11.5–14.5)
WBC: 6.5 x10 3/mm (ref 3.8–10.6)

## 2013-12-31 LAB — COMPREHENSIVE METABOLIC PANEL
ALBUMIN: 2.6 g/dL — AB (ref 3.4–5.0)
ALK PHOS: 51 U/L
AST: 10 U/L — AB (ref 15–37)
Anion Gap: 8 (ref 7–16)
BUN: 6 mg/dL — ABNORMAL LOW (ref 7–18)
Bilirubin,Total: 0.3 mg/dL (ref 0.2–1.0)
CO2: 28 mmol/L (ref 21–32)
Calcium, Total: 7.8 mg/dL — ABNORMAL LOW (ref 8.5–10.1)
Chloride: 104 mmol/L (ref 98–107)
Creatinine: 0.89 mg/dL (ref 0.60–1.30)
EGFR (Non-African Amer.): >60
Glucose: 154 mg/dL — ABNORMAL HIGH (ref 65–99)
Osmolality: 280 (ref 275–301)
Potassium: 2.6 mmol/L — ABNORMAL LOW (ref 3.5–5.1)
SGPT (ALT): 10 U/L — ABNORMAL LOW (ref 12–78)
SODIUM: 140 mmol/L (ref 136–145)
TOTAL PROTEIN: 6.2 g/dL — AB (ref 6.4–8.2)

## 2014-01-01 DIAGNOSIS — M059 Rheumatoid arthritis with rheumatoid factor, unspecified: Secondary | ICD-10-CM | POA: Insufficient documentation

## 2014-01-12 ENCOUNTER — Ambulatory Visit: Payer: Self-pay | Admitting: Hematology and Oncology

## 2014-01-14 LAB — CBC CANCER CENTER
BASOS PCT: 0.9 %
Bands: 5 %
Basophil #: 0 x10 3/mm (ref 0.0–0.1)
EOS ABS: 0.3 x10 3/mm (ref 0.0–0.7)
EOS PCT: 5 %
EOS PCT: 6.9 %
HCT: 34.6 % — ABNORMAL LOW (ref 40.0–52.0)
HGB: 11.4 g/dL — AB (ref 13.0–18.0)
LYMPHS PCT: 37 %
Lymphocyte #: 1.8 x10 3/mm (ref 1.0–3.6)
Lymphocyte %: 46.9 %
MCH: 28.2 pg (ref 26.0–34.0)
MCHC: 32.9 g/dL (ref 32.0–36.0)
MCV: 86 fL (ref 80–100)
MONOS PCT: 28 %
MONOS PCT: 34.4 %
Metamyelocyte: 4 %
Monocyte #: 1.3 x10 3/mm — ABNORMAL HIGH (ref 0.2–1.0)
Myelocyte: 2 %
NEUTROS ABS: 0.4 x10 3/mm — AB (ref 1.4–6.5)
NEUTROS PCT: 10.9 %
Platelet: 151 x10 3/mm (ref 150–440)
RBC: 4.03 10*6/uL — ABNORMAL LOW (ref 4.40–5.90)
RDW: 23.7 % — AB (ref 11.5–14.5)
Segmented Neutrophils: 6 %
VARIANT LYMPHOCYTE - H4-RLYMPH: 13 %
WBC: 3.9 x10 3/mm (ref 3.8–10.6)

## 2014-01-21 LAB — COMPREHENSIVE METABOLIC PANEL
Albumin: 3.2 g/dL — ABNORMAL LOW (ref 3.4–5.0)
Alkaline Phosphatase: 53 U/L
Anion Gap: 7 (ref 7–16)
BUN: 11 mg/dL (ref 7–18)
Bilirubin,Total: 0.3 mg/dL (ref 0.2–1.0)
Calcium, Total: 9.1 mg/dL (ref 8.5–10.1)
Chloride: 102 mmol/L (ref 98–107)
Co2: 29 mmol/L (ref 21–32)
Creatinine: 1.04 mg/dL (ref 0.60–1.30)
Glucose: 106 mg/dL — ABNORMAL HIGH (ref 65–99)
Osmolality: 275 (ref 275–301)
Potassium: 3.7 mmol/L (ref 3.5–5.1)
SGOT(AST): 16 U/L (ref 15–37)
SGPT (ALT): 12 U/L — ABNORMAL LOW
SODIUM: 138 mmol/L (ref 136–145)
Total Protein: 6.9 g/dL (ref 6.4–8.2)

## 2014-01-21 LAB — CBC CANCER CENTER
BASOS ABS: 0.1 x10 3/mm (ref 0.0–0.1)
Basophil %: 0.8 %
Eosinophil #: 0.1 x10 3/mm (ref 0.0–0.7)
Eosinophil %: 1.3 %
HCT: 36.7 % — ABNORMAL LOW (ref 40.0–52.0)
HGB: 11.9 g/dL — AB (ref 13.0–18.0)
Lymphocyte #: 2.3 x10 3/mm (ref 1.0–3.6)
Lymphocyte %: 26.6 %
MCH: 28.1 pg (ref 26.0–34.0)
MCHC: 32.6 g/dL (ref 32.0–36.0)
MCV: 86 fL (ref 80–100)
Monocyte #: 2.1 x10 3/mm — ABNORMAL HIGH (ref 0.2–1.0)
Monocyte %: 24.4 %
NEUTROS ABS: 4 x10 3/mm (ref 1.4–6.5)
Neutrophil %: 46.9 %
PLATELETS: 242 x10 3/mm (ref 150–440)
RBC: 4.25 10*6/uL — ABNORMAL LOW (ref 4.40–5.90)
RDW: 23 % — ABNORMAL HIGH (ref 11.5–14.5)
WBC: 8.6 x10 3/mm (ref 3.8–10.6)

## 2014-01-28 LAB — CBC CANCER CENTER
BASOS ABS: 0 x10 3/mm (ref 0.0–0.1)
Basophil %: 1.4 %
Eosinophil #: 0.2 x10 3/mm (ref 0.0–0.7)
Eosinophil %: 4.5 %
HCT: 32.7 % — AB (ref 40.0–52.0)
HGB: 11 g/dL — ABNORMAL LOW (ref 13.0–18.0)
LYMPHS PCT: 26.9 %
Lymphocyte #: 0.9 x10 3/mm — ABNORMAL LOW (ref 1.0–3.6)
MCH: 29 pg (ref 26.0–34.0)
MCHC: 33.5 g/dL (ref 32.0–36.0)
MCV: 86 fL (ref 80–100)
Monocyte #: 0.1 x10 3/mm — ABNORMAL LOW (ref 0.2–1.0)
Monocyte %: 3 %
NEUTROS ABS: 2.2 x10 3/mm (ref 1.4–6.5)
Neutrophil %: 64.2 %
Platelet: 122 x10 3/mm — ABNORMAL LOW (ref 150–440)
RBC: 3.79 10*6/uL — ABNORMAL LOW (ref 4.40–5.90)
RDW: 21.7 % — AB (ref 11.5–14.5)
WBC: 3.5 x10 3/mm — ABNORMAL LOW (ref 3.8–10.6)

## 2014-02-04 LAB — CBC CANCER CENTER
BASOS PCT: 0.4 %
Basophil #: 0 x10 3/mm (ref 0.0–0.1)
EOS PCT: 9.1 %
Eosinophil #: 0.3 x10 3/mm (ref 0.0–0.7)
HCT: 31.9 % — ABNORMAL LOW (ref 40.0–52.0)
HGB: 10.6 g/dL — AB (ref 13.0–18.0)
LYMPHS ABS: 1.6 x10 3/mm (ref 1.0–3.6)
Lymphocyte %: 47.9 %
MCH: 28.9 pg (ref 26.0–34.0)
MCHC: 33.2 g/dL (ref 32.0–36.0)
MCV: 87 fL (ref 80–100)
MONOS PCT: 32.3 %
Monocyte #: 1.1 x10 3/mm — ABNORMAL HIGH (ref 0.2–1.0)
Neutrophil #: 0.3 x10 3/mm — ABNORMAL LOW (ref 1.4–6.5)
Neutrophil %: 10.3 %
Platelet: 147 x10 3/mm — ABNORMAL LOW (ref 150–440)
RBC: 3.67 10*6/uL — ABNORMAL LOW (ref 4.40–5.90)
RDW: 21.7 % — AB (ref 11.5–14.5)
WBC: 3.3 x10 3/mm — AB (ref 3.8–10.6)

## 2014-02-11 LAB — CBC CANCER CENTER
Basophil #: 0.1 x10 3/mm (ref 0.0–0.1)
Basophil %: 1.4 %
EOS ABS: 0.1 x10 3/mm (ref 0.0–0.7)
EOS PCT: 1.8 %
HCT: 34 % — ABNORMAL LOW (ref 40.0–52.0)
HGB: 11.1 g/dL — ABNORMAL LOW (ref 13.0–18.0)
LYMPHS ABS: 2 x10 3/mm (ref 1.0–3.6)
LYMPHS PCT: 30.8 %
MCH: 28.4 pg (ref 26.0–34.0)
MCHC: 32.7 g/dL (ref 32.0–36.0)
MCV: 87 fL (ref 80–100)
MONOS PCT: 14.7 %
Monocyte #: 1 x10 3/mm (ref 0.2–1.0)
NEUTROS PCT: 51.3 %
Neutrophil #: 3.3 x10 3/mm (ref 1.4–6.5)
Platelet: 289 x10 3/mm (ref 150–440)
RBC: 3.91 10*6/uL — AB (ref 4.40–5.90)
RDW: 21.8 % — AB (ref 11.5–14.5)
WBC: 6.5 x10 3/mm (ref 3.8–10.6)

## 2014-02-11 LAB — COMPREHENSIVE METABOLIC PANEL
Albumin: 3.1 g/dL — ABNORMAL LOW (ref 3.4–5.0)
Alkaline Phosphatase: 62 U/L
Anion Gap: 9 (ref 7–16)
BILIRUBIN TOTAL: 0.2 mg/dL (ref 0.2–1.0)
BUN: 9 mg/dL (ref 7–18)
Calcium, Total: 8.8 mg/dL (ref 8.5–10.1)
Chloride: 103 mmol/L (ref 98–107)
Co2: 27 mmol/L (ref 21–32)
Creatinine: 1.09 mg/dL (ref 0.60–1.30)
EGFR (African American): >60
GLUCOSE: 194 mg/dL — AB (ref 65–99)
OSMOLALITY: 282 (ref 275–301)
Potassium: 3.2 mmol/L — ABNORMAL LOW (ref 3.5–5.1)
SGOT(AST): 16 U/L (ref 15–37)
SGPT (ALT): 16 U/L
Sodium: 139 mmol/L (ref 136–145)
TOTAL PROTEIN: 6.7 g/dL (ref 6.4–8.2)

## 2014-02-12 ENCOUNTER — Ambulatory Visit: Payer: Self-pay | Admitting: Hematology and Oncology

## 2014-03-14 ENCOUNTER — Ambulatory Visit: Payer: Self-pay | Admitting: Family Medicine

## 2014-03-18 ENCOUNTER — Ambulatory Visit: Payer: Self-pay

## 2014-03-18 LAB — COMPREHENSIVE METABOLIC PANEL
ALBUMIN: 3.4 g/dL (ref 3.4–5.0)
ANION GAP: 8 (ref 7–16)
AST: 29 U/L (ref 15–37)
Alkaline Phosphatase: 69 U/L
BUN: 13 mg/dL (ref 7–18)
Bilirubin,Total: 0.4 mg/dL (ref 0.2–1.0)
CALCIUM: 9.3 mg/dL (ref 8.5–10.1)
Chloride: 104 mmol/L (ref 98–107)
Co2: 27 mmol/L (ref 21–32)
Creatinine: 1.35 mg/dL — ABNORMAL HIGH (ref 0.60–1.30)
EGFR (African American): >60
EGFR (Non-African Amer.): 56 — ABNORMAL LOW
Glucose: 126 mg/dL — ABNORMAL HIGH (ref 65–99)
Osmolality: 279 (ref 275–301)
POTASSIUM: 3.9 mmol/L (ref 3.5–5.1)
SGPT (ALT): 21 U/L
Sodium: 139 mmol/L (ref 136–145)
Total Protein: 7.1 g/dL (ref 6.4–8.2)

## 2014-03-18 LAB — CBC CANCER CENTER
Basophil #: 0.1 x10 3/mm (ref 0.0–0.1)
Basophil %: 0.7 %
EOS PCT: 11.6 %
Eosinophil #: 0.9 x10 3/mm — ABNORMAL HIGH (ref 0.0–0.7)
HCT: 35.9 % — AB (ref 40.0–52.0)
HGB: 12 g/dL — ABNORMAL LOW (ref 13.0–18.0)
LYMPHS PCT: 21.9 %
Lymphocyte #: 1.8 x10 3/mm (ref 1.0–3.6)
MCH: 30 pg (ref 26.0–34.0)
MCHC: 33.5 g/dL (ref 32.0–36.0)
MCV: 90 fL (ref 80–100)
Monocyte #: 0.7 x10 3/mm (ref 0.2–1.0)
Monocyte %: 8.2 %
NEUTROS ABS: 4.7 x10 3/mm (ref 1.4–6.5)
NEUTROS PCT: 57.6 %
Platelet: 183 x10 3/mm (ref 150–440)
RBC: 4 10*6/uL — AB (ref 4.40–5.90)
RDW: 21.6 % — ABNORMAL HIGH (ref 11.5–14.5)
WBC: 8.1 x10 3/mm (ref 3.8–10.6)

## 2014-03-18 LAB — LACTATE DEHYDROGENASE: LDH: 232 U/L (ref 85–241)

## 2014-04-01 LAB — CBC CANCER CENTER
BASOS PCT: 1 %
Basophil #: 0.1 x10 3/mm (ref 0.0–0.1)
EOS ABS: 0.5 x10 3/mm (ref 0.0–0.7)
EOS PCT: 4.7 %
HCT: 38.9 % — AB (ref 40.0–52.0)
HGB: 12.8 g/dL — ABNORMAL LOW (ref 13.0–18.0)
Lymphocyte #: 2.9 x10 3/mm (ref 1.0–3.6)
Lymphocyte %: 28.5 %
MCH: 30 pg (ref 26.0–34.0)
MCHC: 32.8 g/dL (ref 32.0–36.0)
MCV: 91 fL (ref 80–100)
Monocyte #: 1.4 x10 3/mm — ABNORMAL HIGH (ref 0.2–1.0)
Monocyte %: 13.8 %
Neutrophil #: 5.3 x10 3/mm (ref 1.4–6.5)
Neutrophil %: 52 %
PLATELETS: 184 x10 3/mm (ref 150–440)
RBC: 4.26 10*6/uL — ABNORMAL LOW (ref 4.40–5.90)
RDW: 21.1 % — AB (ref 11.5–14.5)
WBC: 10.1 x10 3/mm (ref 3.8–10.6)

## 2014-04-01 LAB — COMPREHENSIVE METABOLIC PANEL
ALT: 16 U/L
AST: 20 U/L (ref 15–37)
Albumin: 3.5 g/dL (ref 3.4–5.0)
Alkaline Phosphatase: 57 U/L
Anion Gap: 5 — ABNORMAL LOW (ref 7–16)
BILIRUBIN TOTAL: 0.5 mg/dL (ref 0.2–1.0)
BUN: 16 mg/dL (ref 7–18)
CALCIUM: 9.2 mg/dL (ref 8.5–10.1)
CHLORIDE: 102 mmol/L (ref 98–107)
CO2: 30 mmol/L (ref 21–32)
CREATININE: 1.28 mg/dL (ref 0.60–1.30)
EGFR (Non-African Amer.): 60 — ABNORMAL LOW
GLUCOSE: 100 mg/dL — AB (ref 65–99)
OSMOLALITY: 275 (ref 275–301)
Potassium: 4 mmol/L (ref 3.5–5.1)
SODIUM: 137 mmol/L (ref 136–145)
Total Protein: 7 g/dL (ref 6.4–8.2)

## 2014-04-14 ENCOUNTER — Ambulatory Visit: Payer: Self-pay | Admitting: Internal Medicine

## 2014-04-14 ENCOUNTER — Ambulatory Visit: Payer: Self-pay

## 2014-04-22 LAB — CBC CANCER CENTER
BASOS ABS: 0.2 x10 3/mm — AB (ref 0.0–0.1)
Basophil %: 1.9 %
Eosinophil #: 0.3 x10 3/mm (ref 0.0–0.7)
Eosinophil %: 3.9 %
HCT: 40.5 % (ref 40.0–52.0)
HGB: 13.4 g/dL (ref 13.0–18.0)
LYMPHS PCT: 34.9 %
Lymphocyte #: 3 x10 3/mm (ref 1.0–3.6)
MCH: 30 pg (ref 26.0–34.0)
MCHC: 33.1 g/dL (ref 32.0–36.0)
MCV: 91 fL (ref 80–100)
Monocyte #: 0.9 x10 3/mm (ref 0.2–1.0)
Monocyte %: 11 %
NEUTROS ABS: 4.1 x10 3/mm (ref 1.4–6.5)
NEUTROS PCT: 48.3 %
Platelet: 187 x10 3/mm (ref 150–440)
RBC: 4.47 10*6/uL (ref 4.40–5.90)
RDW: 20.3 % — ABNORMAL HIGH (ref 11.5–14.5)
WBC: 8.6 x10 3/mm (ref 3.8–10.6)

## 2014-04-22 LAB — COMPREHENSIVE METABOLIC PANEL
ALBUMIN: 3.8 g/dL (ref 3.4–5.0)
ALT: 23 U/L
ANION GAP: 11 (ref 7–16)
AST: 20 U/L (ref 15–37)
Alkaline Phosphatase: 63 U/L
BUN: 16 mg/dL (ref 7–18)
Bilirubin,Total: 0.4 mg/dL (ref 0.2–1.0)
CALCIUM: 9.7 mg/dL (ref 8.5–10.1)
Chloride: 101 mmol/L (ref 98–107)
Co2: 24 mmol/L (ref 21–32)
Creatinine: 1.21 mg/dL (ref 0.60–1.30)
EGFR (African American): >60
EGFR (Non-African Amer.): >60
Glucose: 113 mg/dL — ABNORMAL HIGH (ref 65–99)
Osmolality: 274 (ref 275–301)
Potassium: 4.7 mmol/L (ref 3.5–5.1)
SODIUM: 136 mmol/L (ref 136–145)
TOTAL PROTEIN: 7.6 g/dL (ref 6.4–8.2)

## 2014-04-22 LAB — LACTATE DEHYDROGENASE: LDH: 202 U/L (ref 85–241)

## 2014-05-14 ENCOUNTER — Ambulatory Visit: Payer: Self-pay | Admitting: Hematology and Oncology

## 2014-05-24 LAB — COMPREHENSIVE METABOLIC PANEL
ALBUMIN: 3.9 g/dL (ref 3.4–5.0)
ALK PHOS: 60 U/L
Anion Gap: 7 (ref 7–16)
BILIRUBIN TOTAL: 0.5 mg/dL (ref 0.2–1.0)
BUN: 18 mg/dL (ref 7–18)
CREATININE: 1.29 mg/dL (ref 0.60–1.30)
Calcium, Total: 9.4 mg/dL (ref 8.5–10.1)
Chloride: 101 mmol/L (ref 98–107)
Co2: 28 mmol/L (ref 21–32)
EGFR (Non-African Amer.): 59 — ABNORMAL LOW
Glucose: 94 mg/dL (ref 65–99)
Osmolality: 274 (ref 275–301)
Potassium: 4.1 mmol/L (ref 3.5–5.1)
SGOT(AST): 20 U/L (ref 15–37)
SGPT (ALT): 19 U/L
SODIUM: 136 mmol/L (ref 136–145)
Total Protein: 7.8 g/dL (ref 6.4–8.2)

## 2014-05-24 LAB — CBC CANCER CENTER
Basophil #: 0 x10 3/mm (ref 0.0–0.1)
Basophil %: 0.5 %
EOS ABS: 0.2 x10 3/mm (ref 0.0–0.7)
Eosinophil %: 1.8 %
HCT: 42.4 % (ref 40.0–52.0)
HGB: 14 g/dL (ref 13.0–18.0)
LYMPHS PCT: 35.5 %
Lymphocyte #: 3 x10 3/mm (ref 1.0–3.6)
MCH: 30 pg (ref 26.0–34.0)
MCHC: 33 g/dL (ref 32.0–36.0)
MCV: 91 fL (ref 80–100)
MONOS PCT: 12.7 %
Monocyte #: 1.1 x10 3/mm — ABNORMAL HIGH (ref 0.2–1.0)
Neutrophil #: 4.2 x10 3/mm (ref 1.4–6.5)
Neutrophil %: 49.5 %
Platelet: 173 x10 3/mm (ref 150–440)
RBC: 4.66 10*6/uL (ref 4.40–5.90)
RDW: 19 % — ABNORMAL HIGH (ref 11.5–14.5)
WBC: 8.6 x10 3/mm (ref 3.8–10.6)

## 2014-05-24 LAB — LACTATE DEHYDROGENASE: LDH: 210 U/L (ref 85–241)

## 2014-05-24 LAB — TSH: THYROID STIMULATING HORM: 1.83 u[IU]/mL

## 2014-05-24 LAB — MAGNESIUM: Magnesium: 2.1 mg/dL

## 2014-06-14 ENCOUNTER — Ambulatory Visit: Payer: Self-pay | Admitting: Hematology and Oncology

## 2014-07-17 ENCOUNTER — Ambulatory Visit: Payer: Self-pay | Admitting: Hematology and Oncology

## 2014-07-17 LAB — CBC CANCER CENTER
Basophil #: 0 x10 3/mm (ref 0.0–0.1)
Basophil %: 0.8 %
Eosinophil #: 0.2 x10 3/mm (ref 0.0–0.7)
Eosinophil %: 3.7 %
HCT: 35 % — ABNORMAL LOW (ref 40.0–52.0)
HGB: 11.8 g/dL — ABNORMAL LOW (ref 13.0–18.0)
Lymphocyte #: 1.9 x10 3/mm (ref 1.0–3.6)
Lymphocyte %: 29.7 %
MCH: 27.9 pg (ref 26.0–34.0)
MCHC: 33.8 g/dL (ref 32.0–36.0)
MCV: 83 fL (ref 80–100)
Monocyte #: 0.8 x10 3/mm (ref 0.2–1.0)
Monocyte %: 12.5 %
Neutrophil #: 3.4 x10 3/mm (ref 1.4–6.5)
Neutrophil %: 53.3 %
Platelet: 263 x10 3/mm (ref 150–440)
RBC: 4.25 10*6/uL — ABNORMAL LOW (ref 4.40–5.90)
RDW: 18 % — ABNORMAL HIGH (ref 11.5–14.5)
WBC: 6.4 x10 3/mm (ref 3.8–10.6)

## 2014-07-17 LAB — COMPREHENSIVE METABOLIC PANEL
Albumin: 3.1 g/dL — ABNORMAL LOW (ref 3.4–5.0)
Alkaline Phosphatase: 79 U/L (ref 46–116)
Anion Gap: 9 (ref 7–16)
BUN: 9 mg/dL (ref 7–18)
Bilirubin,Total: 0.8 mg/dL (ref 0.2–1.0)
Calcium, Total: 8.9 mg/dL (ref 8.5–10.1)
Chloride: 100 mmol/L (ref 98–107)
Co2: 32 mmol/L (ref 21–32)
Creatinine: 1.09 mg/dL (ref 0.60–1.30)
EGFR (African American): >60
EGFR (Non-African Amer.): >60
Glucose: 105 mg/dL — ABNORMAL HIGH (ref 65–99)
Osmolality: 280 (ref 275–301)
Potassium: 2.8 mmol/L — ABNORMAL LOW (ref 3.5–5.1)
SGOT(AST): 17 U/L (ref 15–37)
SGPT (ALT): 12 U/L — ABNORMAL LOW (ref 14–63)
Sodium: 141 mmol/L (ref 136–145)
Total Protein: 7.3 g/dL (ref 6.4–8.2)

## 2014-07-17 LAB — LACTATE DEHYDROGENASE: LDH: 177 U/L (ref 85–241)

## 2014-07-17 LAB — URIC ACID: Uric Acid: 5.7 mg/dL (ref 3.5–7.2)

## 2014-07-26 DIAGNOSIS — J449 Chronic obstructive pulmonary disease, unspecified: Secondary | ICD-10-CM | POA: Insufficient documentation

## 2014-08-13 ENCOUNTER — Ambulatory Visit
Admit: 2014-08-13 | Disposition: A | Discharge status: Home / Self Care | Payer: Self-pay | Attending: Hematology and Oncology | Admitting: Hematology and Oncology

## 2014-09-13 ENCOUNTER — Ambulatory Visit
Admit: 2014-09-13 | Disposition: A | Discharge status: Home / Self Care | Payer: Self-pay | Attending: Hematology and Oncology | Admitting: Hematology and Oncology

## 2014-09-16 ENCOUNTER — Ambulatory Visit
Admit: 2014-09-16 | Disposition: A | Discharge status: Home / Self Care | Payer: Self-pay | Attending: Hematology and Oncology | Admitting: Hematology and Oncology

## 2014-10-04 NOTE — Op Note (Signed)
PATIENT NAME:  Caleb Francis, Caleb Francis MR#:  [id number] DATE OF BIRTH:  [birth year]  DATE OF PROCEDURE:  03/08/2013  PREOPERATIVE DIAGNOSES:   1. Extensive lymphadenopathy with pathology concerning for lymphoma/leukemia.  2. Hypertension.  3. Rheumatoid arthritis.  POSTOPERATIVE DIAGNOSES:    1. Extensive lymphadenopathy with pathology concerning for lymphoma/leukemia.  2. Hypertension.  3. Rheumatoid arthritis.  PROCEDURES:  1.  Ultrasound guidance for vascular access, right internal jugular vein.  2.  Fluoroscopic guidance for placement of catheter.  3.  Placement of CT compatible Port-A-Cath, right internal jugular vein.   SURGEON:  Algernon Huxley, M.D.   ANESTHESIA:  Local with moderate conscious sedation.   FLUOROSCOPY TIME:  Less than 1 minute.   CONTRAST:  Zero.   ESTIMATED BLOOD LOSS:  25 mL.  INDICATION FOR PROCEDURE:  This is a 69 year old African American male with extensive lymphadenopathy, recent biopsy concerning for lymphoma or leukemia. He needs a Port-A-Cath for initiation of chemotherapy. Risks and benefits were discussed. Informed consent was obtained.   DESCRIPTION OF THE PROCEDURE:  The patient was brought to the vascular and interventional radiology suite. The right neck and chest were sterilely prepped and draped, and a sterile surgical field was created. Ultrasound was used to help visualize a patent right internal jugular vein. This was then accessed under direct ultrasound guidance without difficulty with a Seldinger needle and a permanent image was recorded. A J-wire was placed. After skin nick and dilatation, the peel-away sheath was then placed over the wire. I then anesthetized an area under the clavicle approximately 2 fingerbreadths. A transverse incision was created and an inferior pocket was created with electrocautery and blunt dissection. The port was then brought onto the field, placed into the pocket and secured to the chest wall with 2 Prolene sutures. The  catheter was connected to the port and tunneled from the subclavicular incision to the access site. Fluoroscopic guidance was used to cut the catheter to an appropriate length. The catheter was then placed through the peel-away sheath and the peel-away sheath was removed. The catheter tip was parked in excellent location in the cavoatrial junction. The pocket was then irrigated with antibiotic-impregnated saline and the wound was closed with a running 3-0 Vicryl and a 4-0 Monocryl. The access incision was closed with a single 4-0 Monocryl. The Huber needle was used to withdraw blood and flush the port with heparinized saline. Dermabond was then placed as a dressing. The patient tolerated the procedure well and was taken to the recovery room in stable condition.   ____________________________ Algernon Huxley, MD jsd:sg D: 03/08/2013 14:57:31 ET T: 03/08/2013 16:21:45 ET JOB#: [id number]  cc: Algernon Huxley, MD, <Dictator> Algernon Huxley MD ELECTRONICALLY SIGNED 03/14/2013 15:08

## 2014-10-04 NOTE — Op Note (Signed)
PATIENT NAME:  Caleb Francis, Caleb Francis MR#:  [id number] DATE OF BIRTH:  04-May-1946  DATE OF PROCEDURE:  03/13/2013  PREOPERATIVE DIAGNOSIS: Lymphoma  POSTOPERATIVE DIAGNOSIS:  Lymphoma.   OPERATION PERFORMED: Excision of deep right cervical lymph node.   SURGEON: Roena Malady, M.D.   OPERATIVE FINDINGS: Approximately 2 x 3 cm firm lymph node beneath the platysma on the right.   DESCRIPTION OF PROCEDURE: Caleb Francis was identified in the holding area and taken to the Operating Room and placed in the supine position. After general laryngeal mask anesthesia, the table was turned 90 degrees. The right neck was prepped and draped sterilely.  There was an easily palpable lymph node approximately 6 fingerbreadths below the angle of the mandible. A local anesthetic of 1% lidocaine and 1:100,000 epinephrine was used to inject overlying the mass. A total of 3 mL was used. With the right neck prepped and draped sterilely, a 15 blade was used to incise down to and through the platysma. Hemostasis was achieved using the microbipolar and Bovie cautery. As the platysma was gently divided and the subcutaneous tissues were divided, the mass could easily be identified. This appeared to be an obvious lymph node. Using the microbipolars and blunt dissection, care was taken to remove the lymph node in its entirety several small bleeding vessels which were cauterized using the microbipolar. The lymph node was sent down for frozen section. Callback from Dr. Elana Alm showed adequate specimen with a diagnosis of lymphoma. The wound was then copiously irrigated with saline. No active bleeding. The platysma was closed using 4-0 Vicryl. The subcutaneous tissues were closed with 4-0 Vicryl and the skin was closed using Dermabond. The patient was then returned to anesthesia where he was awakened in the Operating Room and taken to the recovery room in stable condition.   CULTURES: None.   SPECIMENS: Right cervical lymph node.    ESTIMATED BLOOD LOSS: Less than 5 mL.    ____________________________ Roena Malady, MD ctm:cs D: 03/13/2013 13:50:16 ET T: 03/13/2013 14:48:26 ET JOB#: [id number]  cc: Roena Malady, MD, <Dictator> Roena Malady MD ELECTRONICALLY SIGNED 03/27/2013 9:43

## 2014-10-05 NOTE — Discharge Summary (Signed)
PATIENT NAME:  Caleb Francis, Caleb Francis MR#:  [id number] DATE OF BIRTH:  [birth year]  DATE OF ADMISSION:  11/12/2013 DATE OF DISCHARGE:  11/13/2013  PRIMARY CARE PHYSICIAN: Dr. Thereasa Distance.   PRIMARY ONCOLOGIST: Dr. Kallie Edward.   CHIEF COMPLAINT: Chest pain.   DISCHARGE DIAGNOSES:  1.  Chest pain appears atypical.  2.  Mantle cell carcinoma, undergoing chemotherapy.  3.  History of hypertension.   CONDITION ON DISCHARGE: Fair.   CODE STATUS: Full code.   PROCEDURES: Myoview stress test, essentially negative.   MEDICATIONS:  1.  Folic acid 1 mg p.o. daily.  2.  Meloxicam 7.5 mg p.o. daily.  3.  Hydroxychloroquine 200 mg 2 tablets daily.  4.  Norvasc 10 mg daily.  5.  Klor-Con 10 mEq 2 tablets b.i.d.  6.  Prochlorperazine 10 mg 3 times a day.  7.  Prednisone 20 mg 5 tablets once a day.  8.  Latanoprost ophthalmic solution 0.005%, 1 drop to each affected eye at bedtime.    FOLLOWUP: With Dr. Kallie Edward next week.   LABS: Cardiac enzymes x 3 negative. The patient's metabolic panel within normal limits. Hemoglobin and hematocrit 9.1 and 28.3, platelet count 192, white count 7.8. Lipid profile within normal limits. CT angiography of the chest is negative for PE, diffuse lymphadenopathy. There has been some increased lymphadenopathy in comparison to PET CT from 08/08/2013. This could indicate progression of patient's underlying mantle cell lymphoma. UA negative for UTI. B-type natriuretic peptide is 40.   BRIEF SUMMARY OF HOSPITAL COURSE: [name] is a 69 year old African American gentleman with history of mantle cell lymphoma, who is getting chemotherapy at the [facility] and started having chest pain as soon as chemotherapy was started. He was sent to the Emergency Room and was admitted with:  1.  Chest pain. The patient received some nitro. Cardiac enzymes x 3 were negative. EKG did not show any acute changes. His chest pain appears atypical. Blood pressure was stable. He does not have  any history of heart disease. He underwent Myoview stress test, which was essentially negative.  2.  Mantel cell lymphoma with recurrence. The patient gets chemo at the [facility]. He will follow up with Dr. Kallie Edward.  3.  Rheumatoid arthritis, on large dose of prednisone and hydroxychloroquine.  4.  Hypertension, on Norvasc.  5.  Anemia, chronic, appears stable.  6.  Hospital stay otherwise remained stable.   CODE STATUS: The patient remained a full code.   TIME SPENT: 40 minutes.  ____________________________ Hart Rochester Posey Pronto, MD sap:aw D: 11/14/2013 07:04:52 ET T: 11/14/2013 07:14:40 ET JOB#: [id number]  cc: Clytie Shetley A. Posey Pronto, MD, <Dictator> Sofie Hartigan, MD Rae Halsted Kallie Edward, MD Ilda Basset MD ELECTRONICALLY SIGNED 11/26/2013 14:13

## 2014-10-05 NOTE — H&P (Signed)
PATIENT NAME:  Caleb Francis, Caleb Francis MR#:  [id number] DATE OF BIRTH:  09-04-1945  DATE OF ADMISSION:  11/12/2013  PRIMARY CARE PHYSICIAN: Leafy Half, MD  CHIEF COMPLAINT: Chest pain.   HISTORY OF PRESENT ILLNESS: This is a 69 year old man who was in Dr. Elvera Maria office, stated he was receiving chemotherapy, and developed chest tightness while sitting. It lasted about 30 minutes. He was sweating. No shortness of breath. No nausea. He cannot describe how severe the chest pain was. He just said it felt tight in the chest. He was given some nitroglycerin and that seemed to help. He was sent over to the ER for further evaluation. In the ER, his first cardiac enzyme was negative and his EKG showed nonspecific symptoms.   PAST MEDICAL HISTORY: Mantle cell lymphoma with recurrence, rheumatoid arthritis and hypertension.   PAST SURGICAL HISTORY: Port placement and back surgery.   ALLERGIES: No known drug allergies.   MEDICATIONS: As per prescription writer include folic acid 1 mg daily, hydroxychloroquine 200 mg 2 tablets daily, Klor-Con 10 mEq extended-release 2 tablets twice a day, latanoprost 0.005% ophthalmic solution each eye at bedtime, meloxicam 7.5 mg daily, Norvasc 10 mg daily, prednisone 20 mg 5 tablets daily, prochlorperazine 10 mg 3 times a day.   SOCIAL HISTORY: Smokes, 1 pack lasts him 2 days. No alcohol. No drug use. Used to work in [employer] work and [employer].   FAMILY HISTORY: Father died of bone cancer. Mother died of breast cancer.   REVIEW OF SYSTEMS:  CONSTITUTIONAL: Positive for low fever and chill this morning. No weight gain. No weight loss. No weakness or fatigue.  EYES: He does have glaucoma. EARS, NOSE, MOUTH AND THROAT: Positive for runny nose. No sore throat.  CARDIOVASCULAR: Positive chest pain. No palpitations.  RESPIRATORY: No shortness of breath. No cough. No sputum. No hemoptysis.  GASTROINTESTINAL: Positive for occasional right upper quadrant pain. No nausea. No  vomiting. No abdominal pain. No diarrhea. No constipation. No bright red blood per rectum. No melena.  GENITOURINARY: No burning on urination. No hematuria.  MUSCULOSKELETAL: Positive for joint pain, his usual.  NEUROLOGIC: No fainting or blackouts.  PSYCHIATRIC: No anxiety or depression.  ENDOCRINE: No thyroid problems.  HEMATOLOGIC AND LYMPHATIC: No anemia, no easy bruising or bleeding.   PHYSICAL EXAMINATION: VITAL SIGNS: Temperature 98.5, pulse 84, respirations 17, blood pressure 116/72, pulse ox 98% on room air.  GENERAL: No respiratory distress.  EYES: Conjunctivae and lids normal. Pupils equal, round, and reactive to light. Extraocular muscles intact. No nystagmus.  EARS, NOSE, MOUTH AND THROAT: Tympanic membranes: No erythema. Nasal mucosa: No erythema. Throat: No erythema, no exudate seen. Lips and gums: No lesions.  NECK: No JVD. No bruits. No lymphadenopathy. No thyromegaly. No thyroid nodules palpated.  LUNGS: Clear to auscultation. No use of accessory muscles to breathe. No rhonchi, rales, or wheeze heard.  CARDIOVASCULAR: S1, S2 normal. Positive 2/6 systolic ejection murmur. Carotid upstroke 2+ bilaterally. No bruits. Dorsalis pedis pulses 2+ bilaterally. Trace edema of the lower extremity.  ABDOMEN: Soft, nontender. No organosplenomegaly. Some swelling in the right upper quadrant. Normoactive bowel sounds.  EXTREMITIES: No clubbing, edema or cyanosis.  SKIN: No rashes or ulcers seen.  NEUROLOGIC: Cranial nerves II through XII grossly intact. Deep tendon reflexes 2+ bilateral lower extremities.  PSYCHIATRIC: The patient is oriented to person, place, and time.   DIAGNOSTIC DATA: Urinalysis: Trace leukocyte esterase. BNP 40. Glucose 91, BUN 12, creatinine 1.1, sodium 136, potassium 4.4, chloride 105, CO2 24, calcium 8.1. Liver  function tests normal range. Albumin low at 2.2. White blood cell count 5.8, H and H 9.6 and 29.6, platelet count 185,000. Troponin negative.   Chest x-ray  negative.   EKG: Normal sinus rhythm, 92 beats per minute, nonspecific ST-T wave changes.   ASSESSMENT AND PLAN: 1.  Chest pain, atypical in nature. Could be heart disease. We will get serial cardiac enzymes. Monitor on telemetry. Hold meloxicam and give aspirin. Will get a stress test if heart enzymes are negative tomorrow. Less likely pulmonary embolus, but I will get a CT scan of the chest with his cancer history to make sure this is not a pulmonary embolus.   2.  Mental cell lymphoma with recurrence. Usually that means overall prognosis is poor. Follow-up with Dr. Kallie Edward. 3.  Rheumatoid arthritis. On huge dose of prednisone on a daily basis. Consider cutting this back.  4.  Hypertension. Continue Norvasc.  5.  Anemia. This is stable.  6.  Tobacco abuse. Smoking cessation done, 3 minutes by me. Nicotine patch applied.   TIME SPENT ON ADMISSION: 50 minutes.   ____________________________ Tana Conch. Leslye Peer, MD rjw:sb D: 11/12/2013 15:08:27 ET T: 11/12/2013 15:47:06 ET JOB#: [id number]  cc: Tana Conch. Leslye Peer, MD, <Dictator> Sofie Hartigan, MD Rae Halsted Kallie Edward, MD Marisue Brooklyn MD ELECTRONICALLY SIGNED 11/15/2013 16:07

## 2014-10-05 NOTE — Consult Note (Signed)
Brief Consult Note: Diagnosis: Mantle Cell Lymphoma for [facility] chemo today.   Comments: Patient was hypotensive on prsentation to infusion suite this morning. Within a few seconds of Rituxan infusion he developed chest pressure rating it at 9/10 across his chest. He denied any radiation of pain but was clammy/ BP stayed around 70/40. Patient given bolus IV normal saline,Solumedrol and Benadryl 25mg  iv. Patients blood pressure improved slightly to 90/50. Chest pain persisted at 9/10 > 5 min. Given sublingual nitro 0.4mg  and with no relief repeated times 1- 5 minutes later. EMS also called at this time. BP after nitro decreased to 80/50. Chest pressure after 2nd nitro decrerased to 5/10 and BP improved after a few minutes to 90/50. EMS arrived and patient transported to ED for evaluation of angina type chest pain.  Electronic Signatures: Georges Mouse (MD)  (Signed 01-Jun-15 12:37)  Authored: Brief Consult Note   Last Updated: 01-Jun-15 12:37 by Georges Mouse (MD)

## 2014-10-22 ENCOUNTER — Emergency Department: Payer: Medicare Other

## 2014-10-22 DIAGNOSIS — C8590 Non-Hodgkin lymphoma, unspecified, unspecified site: Secondary | ICD-10-CM | POA: Insufficient documentation

## 2014-10-22 DIAGNOSIS — C8593 Non-Hodgkin lymphoma, unspecified, intra-abdominal lymph nodes: Secondary | ICD-10-CM

## 2014-10-22 DIAGNOSIS — I10 Essential (primary) hypertension: Secondary | ICD-10-CM | POA: Insufficient documentation

## 2014-10-22 DIAGNOSIS — R609 Edema, unspecified: Secondary | ICD-10-CM

## 2014-10-22 DIAGNOSIS — R1032 Left lower quadrant pain: Secondary | ICD-10-CM

## 2014-10-22 DIAGNOSIS — N131 Hydronephrosis with ureteral stricture, not elsewhere classified: Secondary | ICD-10-CM | POA: Diagnosis not present

## 2014-10-22 LAB — COMPREHENSIVE METABOLIC PANEL
ALBUMIN: 3.6 g/dL (ref 3.5–5.0)
ALT: 8 U/L — AB (ref 17–63)
AST: 15 U/L (ref 15–41)
Alkaline Phosphatase: 63 U/L (ref 38–126)
Anion gap: 9 (ref 5–15)
BILIRUBIN TOTAL: 1 mg/dL (ref 0.3–1.2)
BUN: 9 mg/dL (ref 6–20)
CHLORIDE: 101 mmol/L (ref 101–111)
CO2: 26 mmol/L (ref 22–32)
CREATININE: 0.95 mg/dL (ref 0.61–1.24)
Calcium: 8.9 mg/dL (ref 8.9–10.3)
GFR calc Af Amer: >60 mL/min (ref >60)
GFR calc non Af Amer: >60 mL/min (ref >60)
Glucose, Bld: 112 mg/dL — ABNORMAL HIGH (ref 65–99)
POTASSIUM: 3 mmol/L — AB (ref 3.5–5.1)
Sodium: 136 mmol/L (ref 135–145)
Total Protein: 7.5 g/dL (ref 6.5–8.1)

## 2014-10-22 LAB — URINALYSIS COMPLETE WITH MICROSCOPIC (ARMC ONLY)
Bilirubin Urine: NEGATIVE
Glucose, UA: NEGATIVE mg/dL
Ketones, ur: NEGATIVE mg/dL
NITRITE: NEGATIVE
PH: 6 (ref 5.0–8.0)
PROTEIN: 30 mg/dL — AB
Specific Gravity, Urine: 1.015 (ref 1.005–1.030)

## 2014-10-22 LAB — LIPASE, BLOOD: LIPASE: 26 U/L (ref 22–51)

## 2014-10-22 NOTE — ED Notes (Signed)
Patient ambulatory to triage with steady gait, without difficulty or distress noted; pt reports swelling to left upper thigh since last night; also c/o left lower abd pain with no accomp symptoms since last night; denies hx of same

## 2014-10-23 ENCOUNTER — Encounter: Payer: Self-pay | Admitting: Emergency Medicine

## 2014-10-23 ENCOUNTER — Emergency Department
Admission: EM | Admit: 2014-10-23 | Discharge: 2014-10-23 | Disposition: A | Discharge status: Home / Self Care | Payer: Medicare Other | Source: Home / Self Care | Attending: Emergency Medicine | Admitting: Emergency Medicine

## 2014-10-23 ENCOUNTER — Telehealth: Payer: Self-pay | Admitting: *Deleted

## 2014-10-23 ENCOUNTER — Emergency Department: Payer: Medicare Other

## 2014-10-23 DIAGNOSIS — R1032 Left lower quadrant pain: Secondary | ICD-10-CM

## 2014-10-23 DIAGNOSIS — C8590 Non-Hodgkin lymphoma, unspecified, unspecified site: Secondary | ICD-10-CM

## 2014-10-23 DIAGNOSIS — R609 Edema, unspecified: Secondary | ICD-10-CM

## 2014-10-23 HISTORY — DX: Unspecified osteoarthritis, unspecified site: M19.90

## 2014-10-23 HISTORY — DX: Essential (primary) hypertension: I10

## 2014-10-23 LAB — CBC WITH DIFFERENTIAL/PLATELET
Band Neutrophils: 31 % — ABNORMAL HIGH (ref 0–10)
Basophils Absolute: 0 10*3/uL (ref 0.0–0.1)
Basophils Relative: 0 % (ref 0–1)
Blasts: 0 %
EOS ABS: 0 10*3/uL (ref 0.0–0.7)
EOS PCT: 0 % (ref 0–5)
HEMATOCRIT: 36.9 % — AB (ref 40.0–52.0)
HEMOGLOBIN: 12.4 g/dL — AB (ref 13.0–18.0)
LYMPHS PCT: 17 % (ref 12–46)
Lymphs Abs: 1.4 10*3/uL (ref 0.7–4.0)
MCH: 28.3 pg (ref 26.0–34.0)
MCHC: 33.5 g/dL (ref 32.0–36.0)
MCV: 84.4 fL (ref 80.0–100.0)
METAMYELOCYTES PCT: 9 %
MONO ABS: 1.1 10*3/uL — AB (ref 0.1–1.0)
Monocytes Relative: 14 % — ABNORMAL HIGH (ref 3–12)
Myelocytes: 1 %
Neutro Abs: 5.5 10*3/uL (ref 1.7–7.7)
Neutrophils Relative %: 28 % — ABNORMAL LOW (ref 43–77)
Other: 0 %
PROMYELOCYTES ABS: 0 %
Platelets: 94 10*3/uL — ABNORMAL LOW (ref 150–440)
RBC: 4.37 MIL/uL — ABNORMAL LOW (ref 4.40–5.90)
RDW: 20.6 % — AB (ref 11.5–14.5)
SMEAR REVIEW: DECREASED
WBC: 8 10*3/uL (ref 3.8–10.6)
nRBC: 0 /100 WBC

## 2014-10-23 MED ORDER — IOHEXOL 240 MG/ML SOLN
25.0000 mL | Freq: Once | INTRAMUSCULAR | Status: AC | PRN
  Administered 2014-10-23: 25 mL via ORAL
  Filled 2014-10-23: qty 25

## 2014-10-23 MED ORDER — IOHEXOL 300 MG/ML  SOLN
100.0000 mL | Freq: Once | INTRAMUSCULAR | Status: AC | PRN
  Administered 2014-10-23: 100 mL via INTRAVENOUS
  Filled 2014-10-23: qty 100

## 2014-10-23 NOTE — ED Notes (Signed)
Pt resting quietly in room with tv on and lights dimmed drinking PO contrast. No acute distress noted. Will continue to monitor.

## 2014-10-23 NOTE — ED Notes (Signed)
Pt resting quietly in darkened room. Eyes closed and even respirations. No increased work of breathing or acute distress noted at this time. Pt easily aroused and prepared for discharge.

## 2014-10-23 NOTE — Telephone Encounter (Signed)
Went to ER last night and lymphoma is back, needs an appt to see Dr Mike Gip to discuss tx. Colette to call pt to give an appt to him

## 2014-10-23 NOTE — ED Provider Notes (Signed)
[facility] Emergency Department Provider Note  ____________________________________________  Time seen: Approximately 0014 AM  I have reviewed the triage vital signs and the nursing notes.   HISTORY  Chief Complaint Abdominal Pain and Leg Swelling    HPI Caleb Francis is a 69 y.o. male comes in with 3 complaints. He reports that he has some abdominal pain some swelling of his left thigh and a knot next to his groin. The shunt reports that all the symptoms showed up last night. He reports his abdominal pain is F lower quadrant and it is sharp. He reports it is eased up since he is arrives in the hospital but is consistent all day. The patient reports that he has not had any diarrhea or constipation but was nauseous this morning. He reports the pain as a 10 out of 10 in intensity. The patient reports that he not take anything for the pain but came in for evaluation. He reports that he has never had pain like this in the past. He also reports that his thigh has been swollen today. He denies any trauma or pain. He did not put any ice on it or use any ibuprofen. He reports that it does not change when he walks.Patient also reports that he noticed this groin lump in his left groin yesterday as well. He reports it is not painful but comes and stays and does not improve. The patient reports that he has lymphoma and is taking medications for his lymphoma. He reports that he does have an appointment with the cancer physician in [place] on the 23rd.   Past Medical History  Diagnosis Date  . Arthritis   . Cancer   . Hypertension     There are no active problems to display for this patient.   History reviewed. No pertinent past surgical history.  No current outpatient prescriptions on file.  Allergies Review of patient's allergies indicates no known allergies.  No family history on file.  Social History History  Substance Use Topics  . Smoking status: Not  on file  . Smokeless tobacco: Not on file  . Alcohol Use: Not on file    Review of Systems Constitutional: No fever/chills Eyes: No visual changes. ENT: No sore throat. Cardiovascular: Denies chest pain. Respiratory: Denies shortness of breath. Gastrointestinal:abdominal pain and nausea, no vomiting.  No diarrhea.  No constipation. Genitourinary: Negative for dysuria. Musculoskeletal: Negative for back pain. Skin: Negative for rash. Neurological: Negative for headaches, focal weakness or numbness. Hematological/Lymphatic:Swelling of his left thigh  10-point ROS otherwise negative.  ____________________________________________   PHYSICAL EXAM:  VITAL SIGNS: ED Triage Vitals  Enc Vitals Group     BP 10/22/14 2118 150/86 mmHg     Pulse Rate 10/22/14 2118 100     Resp 10/22/14 2118 20     Temp 10/22/14 2118 98.2 F (36.8 C)     Temp Source 10/22/14 2118 Oral     SpO2 10/22/14 2118 99 %     Weight 10/22/14 2118 168 lb (76.204 kg)     Height 10/22/14 2118 5\' 9"  (1.753 m)     Head Cir --      Peak Flow --      Pain Score 10/23/14 0004 10     Pain Loc --      Pain Edu? --      Excl. in [place]? --     Constitutional: Alert and oriented. Well appearing and in no acute distress. Eyes: Conjunctivae are  normal. PERRL. EOMI. Head: Atraumatic. Nose: No congestion/rhinnorhea. Mouth/Throat: Mucous membranes are moist.  Oropharynx non-erythematous.  Cardiovascular: Normal rate, regular rhythm. Grossly normal heart sounds.  Good peripheral circulation. Respiratory: Normal respiratory effort.  No retractions. Lungs CTAB. Gastrointestinal: Left lower quadrant tenderness to palpation above the area of his groin swelling Genitourinary: Mild swelling and left groin palpable hernia nontender Musculoskeletal: All of left thigh with some mild erythema no tenderness to palpation Neurologic:  Normal speech and language. No gross focal neurologic deficits are appreciated. Speech is normal.   Skin:  Skin is warm, dry and intact. No rash noted. Psychiatric: Mood and affect are normal. Speech and behavior are normal.  ____________________________________________   LABS (all labs ordered are listed, but only abnormal results are displayed)  Labs Reviewed  CBC WITH DIFFERENTIAL/PLATELET - Abnormal; Notable for the following:    RBC 4.37 (*)    Hemoglobin 12.4 (*)    HCT 36.9 (*)    RDW 20.6 (*)    Platelets 94 (*)    All other components within normal limits  COMPREHENSIVE METABOLIC PANEL - Abnormal; Notable for the following:    Potassium 3.0 (*)    Glucose, Bld 112 (*)    ALT 8 (*)    All other components within normal limits  URINALYSIS COMPLETEWITH MICROSCOPIC ([facility])  - Abnormal; Notable for the following:    Color, Urine YELLOW (*)    APPearance HAZY (*)    Hgb urine dipstick 1+ (*)    Protein, ur 30 (*)    Leukocytes, UA 1+ (*)    Bacteria, UA RARE (*)    Squamous Epithelial / LPF 0-5 (*)    All other components within normal limits  LIPASE, BLOOD   ____________________________________________  EKG  None ____________________________________________  RADIOLOGY  CT abdomen and pelvis: Significant interval enlargement of diffuse retroperitoneal bilateral pelvic and left groin lymph nodes lymphadenopathy is most prominent on the left diffuse edema demonstrated in the left upper leg left renal hydronephrosis and hydroureter probably due to extrinsic compression by lymphadenopathy findings suggest progression of lymphoma.  No evidence of DVT ____________________________________________   PROCEDURES  Procedure(s) performed: None  Critical Care performed: No  ____________________________________________   INITIAL IMPRESSION / ASSESSMENT AND PLAN / ED COURSE  Pertinent labs & imaging results that were available during my care of the patient were reviewed by me and considered in my medical decision making (see chart for details).  This is a  69 year old male who comes in with some left lower quadrant abdominal pain and left thigh welling and swelling in his left groin. The groin swelling does appear to be hernia. Given his 10 out of 10 abdominal pain I will do a CT scan of the patient's abdomen to evaluate for diverticulitis or incarcerated hernia.  ----------------------------------------- 3:32 AM on 10/23/2014 -----------------------------------------   The results of the CT scan appear as though the patient's symptoms are coming from the progression of his lymphoma. He reports that he does have an appointment with the oncologist at [facility] in 12 days. I will discharge the patient to follow up with his doctors at [facility] for continued treatment of his lymphoma.  ____________________________________________   FINAL CLINICAL IMPRESSION(S) / ED DIAGNOSES  Final diagnoses:  Lymphoma  Abdominal pain       Loney Hering, MD 10/23/14 [phone]

## 2014-10-23 NOTE — Discharge Instructions (Signed)

## 2014-10-23 NOTE — ED Notes (Signed)
Pt reports he has finished PO contrast. CT made aware.

## 2014-10-23 NOTE — ED Notes (Signed)
Spoke with Dahlia Client, MD regarding patient's presenting c/o. MD with [facility] for CT abd/pelvis with contrast. MD also asking that a PIV be placed. Orders to be entered and carried by this RN.

## 2014-10-24 ENCOUNTER — Inpatient Hospital Stay: Payer: Medicare Other | Attending: Hematology and Oncology | Admitting: Hematology and Oncology

## 2014-10-24 ENCOUNTER — Encounter
Admission: AD | Disposition: A | Discharge status: Home Health | Payer: Self-pay | Source: Ambulatory Visit | Attending: Internal Medicine

## 2014-10-24 ENCOUNTER — Inpatient Hospital Stay: Admission: AD | Admit: 2014-10-24 | Payer: Medicare Other | Admitting: Hematology and Oncology

## 2014-10-24 ENCOUNTER — Telehealth: Payer: Self-pay

## 2014-10-24 ENCOUNTER — Ambulatory Visit: Payer: Medicare Other | Admitting: Hematology and Oncology

## 2014-10-24 ENCOUNTER — Inpatient Hospital Stay: Payer: Medicare Other | Admitting: Anesthesiology

## 2014-10-24 ENCOUNTER — Inpatient Hospital Stay
Admission: AD | Admit: 2014-10-24 | Discharge: 2014-11-01 | DRG: 693 | Disposition: A | Discharge status: Home Health | Payer: Medicare Other | Source: Ambulatory Visit | Attending: Internal Medicine | Admitting: Internal Medicine

## 2014-10-24 ENCOUNTER — Encounter: Payer: Self-pay | Admitting: Hematology and Oncology

## 2014-10-24 ENCOUNTER — Other Ambulatory Visit: Payer: Self-pay | Admitting: Family Medicine

## 2014-10-24 ENCOUNTER — Inpatient Hospital Stay
Admission: AD | Admit: 2014-10-24 | Payer: Medicare Other | Source: Ambulatory Visit | Admitting: Hematology and Oncology

## 2014-10-24 VITALS — BP 138/82 | HR 97 | Temp 99.7°F | Ht 69.0 in | Wt 169.3 lb

## 2014-10-24 DIAGNOSIS — J189 Pneumonia, unspecified organism: Secondary | ICD-10-CM | POA: Diagnosis not present

## 2014-10-24 DIAGNOSIS — G5790 Unspecified mononeuropathy of unspecified lower limb: Secondary | ICD-10-CM | POA: Diagnosis present

## 2014-10-24 DIAGNOSIS — R1032 Left lower quadrant pain: Secondary | ICD-10-CM

## 2014-10-24 DIAGNOSIS — F17210 Nicotine dependence, cigarettes, uncomplicated: Secondary | ICD-10-CM | POA: Diagnosis present

## 2014-10-24 DIAGNOSIS — R531 Weakness: Secondary | ICD-10-CM | POA: Diagnosis not present

## 2014-10-24 DIAGNOSIS — M1990 Unspecified osteoarthritis, unspecified site: Secondary | ICD-10-CM | POA: Diagnosis present

## 2014-10-24 DIAGNOSIS — Z79899 Other long term (current) drug therapy: Secondary | ICD-10-CM | POA: Insufficient documentation

## 2014-10-24 DIAGNOSIS — N131 Hydronephrosis with ureteral stricture, not elsewhere classified: Secondary | ICD-10-CM | POA: Diagnosis present

## 2014-10-24 DIAGNOSIS — E876 Hypokalemia: Secondary | ICD-10-CM | POA: Diagnosis present

## 2014-10-24 DIAGNOSIS — I10 Essential (primary) hypertension: Secondary | ICD-10-CM | POA: Diagnosis present

## 2014-10-24 DIAGNOSIS — D649 Anemia, unspecified: Secondary | ICD-10-CM | POA: Diagnosis present

## 2014-10-24 DIAGNOSIS — G893 Neoplasm related pain (acute) (chronic): Secondary | ICD-10-CM | POA: Diagnosis present

## 2014-10-24 DIAGNOSIS — R140 Abdominal distension (gaseous): Secondary | ICD-10-CM | POA: Insufficient documentation

## 2014-10-24 DIAGNOSIS — I129 Hypertensive chronic kidney disease with stage 1 through stage 4 chronic kidney disease, or unspecified chronic kidney disease: Secondary | ICD-10-CM | POA: Insufficient documentation

## 2014-10-24 DIAGNOSIS — K5900 Constipation, unspecified: Secondary | ICD-10-CM | POA: Diagnosis not present

## 2014-10-24 DIAGNOSIS — E883 Tumor lysis syndrome: Secondary | ICD-10-CM | POA: Diagnosis present

## 2014-10-24 DIAGNOSIS — N3289 Other specified disorders of bladder: Secondary | ICD-10-CM | POA: Diagnosis not present

## 2014-10-24 DIAGNOSIS — C8310 Mantle cell lymphoma, unspecified site: Secondary | ICD-10-CM

## 2014-10-24 DIAGNOSIS — M069 Rheumatoid arthritis, unspecified: Secondary | ICD-10-CM | POA: Insufficient documentation

## 2014-10-24 DIAGNOSIS — I351 Nonrheumatic aortic (valve) insufficiency: Secondary | ICD-10-CM | POA: Diagnosis not present

## 2014-10-24 DIAGNOSIS — N189 Chronic kidney disease, unspecified: Secondary | ICD-10-CM | POA: Insufficient documentation

## 2014-10-24 DIAGNOSIS — Z809 Family history of malignant neoplasm, unspecified: Secondary | ICD-10-CM | POA: Diagnosis not present

## 2014-10-24 DIAGNOSIS — N1330 Unspecified hydronephrosis: Secondary | ICD-10-CM | POA: Diagnosis present

## 2014-10-24 DIAGNOSIS — N134 Hydroureter: Secondary | ICD-10-CM | POA: Insufficient documentation

## 2014-10-24 DIAGNOSIS — Z5111 Encounter for antineoplastic chemotherapy: Secondary | ICD-10-CM | POA: Insufficient documentation

## 2014-10-24 DIAGNOSIS — Z87891 Personal history of nicotine dependence: Secondary | ICD-10-CM | POA: Insufficient documentation

## 2014-10-24 DIAGNOSIS — R000 Tachycardia, unspecified: Secondary | ICD-10-CM | POA: Diagnosis not present

## 2014-10-24 DIAGNOSIS — R066 Hiccough: Secondary | ICD-10-CM | POA: Diagnosis present

## 2014-10-24 DIAGNOSIS — I495 Sick sinus syndrome: Secondary | ICD-10-CM | POA: Diagnosis not present

## 2014-10-24 HISTORY — DX: Personal history of other medical treatment: Z92.89

## 2014-10-24 HISTORY — DX: Rheumatoid arthritis, unspecified: M06.9

## 2014-10-24 HISTORY — PX: CYSTOSCOPY W/ URETERAL STENT PLACEMENT: SHX1429

## 2014-10-24 HISTORY — DX: Anemia, unspecified: D64.9

## 2014-10-24 LAB — CBC WITH DIFFERENTIAL/PLATELET
BAND NEUTROPHILS: 17 %
Basophils Absolute: 0 10*3/uL (ref 0–0.1)
Basophils Relative: 0 %
Eosinophils Absolute: 0.2 10*3/uL (ref 0–0.7)
Eosinophils Relative: 3 %
HEMATOCRIT: 37.6 % — AB (ref 40.0–52.0)
Hemoglobin: 12.4 g/dL — ABNORMAL LOW (ref 13.0–18.0)
Lymphocytes Relative: 27 %
Lymphs Abs: 2 10*3/uL (ref 1.0–3.6)
MCH: 28 pg (ref 26.0–34.0)
MCHC: 33.1 g/dL (ref 32.0–36.0)
MCV: 84.8 fL (ref 80.0–100.0)
MONOS PCT: 19 %
Metamyelocytes Relative: 10 %
Monocytes Absolute: 1.4 10*3/uL — ABNORMAL HIGH (ref 0.2–1.0)
Myelocytes: 5 %
Neutro Abs: 3.7 10*3/uL (ref 1.4–6.5)
Neutrophils Relative %: 17 %
PROMYELOCYTES ABS: 2 %
Platelets: 92 10*3/uL — ABNORMAL LOW (ref 150–440)
RBC: 4.43 MIL/uL (ref 4.40–5.90)
RDW: 20.3 % — ABNORMAL HIGH (ref 11.5–14.5)
SMEAR REVIEW: ADEQUATE
WBC: 7.3 10*3/uL (ref 3.8–10.6)

## 2014-10-24 LAB — URIC ACID: Uric Acid, Serum: 6.7 mg/dL (ref 4.4–7.6)

## 2014-10-24 LAB — COMPREHENSIVE METABOLIC PANEL
ALT: 9 U/L — ABNORMAL LOW (ref 17–63)
ANION GAP: 12 (ref 5–15)
AST: 19 U/L (ref 15–41)
Albumin: 3.5 g/dL (ref 3.5–5.0)
Alkaline Phosphatase: 79 U/L (ref 38–126)
BILIRUBIN TOTAL: 2.1 mg/dL — AB (ref 0.3–1.2)
BUN: 15 mg/dL (ref 6–20)
CO2: 27 mmol/L (ref 22–32)
Calcium: 9 mg/dL (ref 8.9–10.3)
Chloride: 95 mmol/L — ABNORMAL LOW (ref 101–111)
Creatinine, Ser: 1.16 mg/dL (ref 0.61–1.24)
GFR calc non Af Amer: >60 mL/min (ref >60)
Glucose, Bld: 100 mg/dL — ABNORMAL HIGH (ref 65–99)
Potassium: 3 mmol/L — ABNORMAL LOW (ref 3.5–5.1)
Sodium: 134 mmol/L — ABNORMAL LOW (ref 135–145)
Total Protein: 7.4 g/dL (ref 6.5–8.1)

## 2014-10-24 LAB — PROTIME-INR
INR: 1.16
PROTHROMBIN TIME: 15 s (ref 11.4–15.0)

## 2014-10-24 SURGERY — CYSTOSCOPY, WITH RETROGRADE PYELOGRAM AND URETERAL STENT INSERTION
Anesthesia: General | Laterality: Left | Wound class: Clean

## 2014-10-24 MED ORDER — FENTANYL CITRATE (PF) 100 MCG/2ML IJ SOLN: 25.0000 ug | INTRAMUSCULAR | Status: DC | PRN

## 2014-10-24 MED ORDER — HYDROMORPHONE HCL 1 MG/ML IJ SOLN
INTRAMUSCULAR | Status: DC | PRN
  Administered 2014-10-24: 1 mg via INTRAVENOUS

## 2014-10-24 MED ORDER — CEFAZOLIN SODIUM-DEXTROSE 2-3 GM-% IV SOLR
INTRAVENOUS | Status: DC | PRN
  Administered 2014-10-24: 2 g via INTRAVENOUS

## 2014-10-24 MED ORDER — OXYCODONE HCL 5 MG PO TABS: 5.0000 mg | ORAL_TABLET | ORAL | Status: DC | PRN

## 2014-10-24 MED ORDER — ZOLPIDEM TARTRATE 5 MG PO TABS: 5.0000 mg | ORAL_TABLET | Freq: Every evening | ORAL | Status: DC | PRN

## 2014-10-24 MED ORDER — MORPHINE SULFATE 4 MG/ML IJ SOLN
2.0000 mg | INTRAMUSCULAR | Status: DC | PRN
  Administered 2014-10-24 – 2014-10-26 (×7): 2 mg via INTRAVENOUS
  Filled 2014-10-24 (×7): qty 1

## 2014-10-24 MED ORDER — STERILE WATER FOR IRRIGATION IR SOLN
Status: DC | PRN
  Administered 2014-10-24: 1000 mL

## 2014-10-24 MED ORDER — PHENYLEPHRINE HCL 10 MG/ML IJ SOLN
INTRAMUSCULAR | Status: DC | PRN
  Administered 2014-10-24: 150 ug via INTRAVENOUS
  Administered 2014-10-24: 100 ug via INTRAVENOUS
  Administered 2014-10-24 (×2): 200 ug via INTRAVENOUS

## 2014-10-24 MED ORDER — LIDOCAINE HCL (CARDIAC) 20 MG/ML IV SOLN
INTRAVENOUS | Status: DC | PRN
  Administered 2014-10-24: 100 mg via INTRAVENOUS

## 2014-10-24 MED ORDER — PROPOFOL 10 MG/ML IV BOLUS
INTRAVENOUS | Status: DC | PRN
  Administered 2014-10-24: 130 mg via INTRAVENOUS

## 2014-10-24 MED ORDER — IOTHALAMATE MEGLUMINE 17.2 % UR SOLN
URETHRAL | Status: DC | PRN
  Administered 2014-10-24: 20 mL via URETHRAL

## 2014-10-24 MED ORDER — ONDANSETRON HCL 4 MG/2ML IJ SOLN: 4.0000 mg | Freq: Once | INTRAMUSCULAR | Status: DC | PRN

## 2014-10-24 MED ORDER — AMLODIPINE BESYLATE 5 MG PO TABS
5.0000 mg | ORAL_TABLET | Freq: Every day | ORAL | Status: DC
  Administered 2014-10-25 – 2014-10-30 (×6): 5 mg via ORAL
  Filled 2014-10-24 (×6): qty 1

## 2014-10-24 MED ORDER — FENTANYL CITRATE (PF) 100 MCG/2ML IJ SOLN
INTRAMUSCULAR | Status: DC | PRN
Start: 2014-10-24 — End: 2014-10-24
  Administered 2014-10-24: 100 ug via INTRAVENOUS

## 2014-10-24 MED ORDER — SODIUM CHLORIDE 0.9 % IV SOLN
INTRAVENOUS | Status: DC
  Administered 2014-10-24 – 2014-10-25 (×5): via INTRAVENOUS

## 2014-10-24 MED ORDER — ACETAMINOPHEN 325 MG PO TABS
650.0000 mg | ORAL_TABLET | ORAL | Status: DC | PRN
  Filled 2014-10-24: qty 2

## 2014-10-24 MED ORDER — EPHEDRINE SULFATE 50 MG/ML IJ SOLN
INTRAMUSCULAR | Status: DC | PRN
  Administered 2014-10-24: 5 mg via INTRAVENOUS

## 2014-10-24 MED ORDER — CEFAZOLIN SODIUM-DEXTROSE 2-3 GM-% IV SOLR
2.0000 g | INTRAVENOUS | Status: DC
  Filled 2014-10-24: qty 50

## 2014-10-24 MED ORDER — SENNOSIDES-DOCUSATE SODIUM 8.6-50 MG PO TABS: 1.0000 | ORAL_TABLET | Freq: Every evening | ORAL | Status: DC | PRN

## 2014-10-24 SURGICAL SUPPLY — 22 items
BAG DRAIN CYSTO-URO LG1000N (MISCELLANEOUS) ×3 IMPLANT
CANISTER SUCT LVC 12 LTR MEDI- (MISCELLANEOUS) ×3 IMPLANT
CATH COUDE FOLEY 2W 5CC 16FR (CATHETERS) ×3 IMPLANT
CATH URETL 5X70 OPEN END (CATHETERS) ×3 IMPLANT
DRAPE XRAY CASSETTE 23X24 (DRAPES) ×3 IMPLANT
GLOVE BIO SURGEON STRL SZ8 (GLOVE) ×3 IMPLANT
GLOVE BIOGEL PI IND STRL 7.5 (GLOVE) ×1 IMPLANT
GLOVE BIOGEL PI INDICATOR 7.5 (GLOVE) ×2
GOWN STRL REUS W/ TWL LRG LVL3 (GOWN DISPOSABLE) ×1 IMPLANT
GOWN STRL REUS W/ TWL XL LVL3 (GOWN DISPOSABLE) ×1 IMPLANT
GOWN STRL REUS W/TWL LRG LVL3 (GOWN DISPOSABLE) ×2
GOWN STRL REUS W/TWL XL LVL3 (GOWN DISPOSABLE) ×2
GUIDEWIRE GREEN .038 145CM (MISCELLANEOUS) ×6 IMPLANT
GUIDEWIRE STR ZIPWIRE 035X150 (MISCELLANEOUS) IMPLANT
NS IRRIG 500ML POUR BTL (IV SOLUTION) IMPLANT
PACK CYSTO AR (MISCELLANEOUS) ×3 IMPLANT
PREP PVP WINGED SPONGE (MISCELLANEOUS) ×3 IMPLANT
SOL .9 NS 3000ML IRR  AL (IV SOLUTION) ×2
SOL .9 NS 3000ML IRR UROMATIC (IV SOLUTION) ×1 IMPLANT
STENT URET 6FRX26 CONTOUR (STENTS) ×3 IMPLANT
STENT URETL 6X26 FLEX (Stent) ×3 IMPLANT
WATER STERILE IRR 1000ML POUR (IV SOLUTION) ×3 IMPLANT

## 2014-10-24 NOTE — Plan of Care (Addendum)
Problem: Discharge Progression Outcomes Goal: Discharge plan in place and appropriate Outcome: Progressing From home lives alone, son is main support person, pt has mantle cell lymphoma, direct admit from cancer center, history of HTN controlled by home meds, Right port a cath, refuses SCDs, urology consult, chemo last year, PET 09/2014 progressive disease

## 2014-10-24 NOTE — Anesthesia Preprocedure Evaluation (Signed)
Anesthesia Evaluation  Patient identified by MRN, date of birth, ID band Patient awake    Reviewed: Allergy & Precautions, NPO status , Patient's Chart, lab work & pertinent test results  History of Anesthesia Complications Negative for: history of anesthetic complications  Airway Mallampati: II  TM Distance: >3 FB Neck ROM: Full    Dental  (+) Partial Upper   Pulmonary neg pulmonary ROS, former smoker,  breath sounds clear to auscultation  Pulmonary exam normal       Cardiovascular Exercise Tolerance: Poor hypertension, Pt. on medications Normal cardiovascular examRhythm:Regular Rate:Normal     Neuro/Psych negative neurological ROS  negative psych ROS   GI/Hepatic negative GI ROS, Neg liver ROS,   Endo/Other  negative endocrine ROS  Renal/GU ARFRenal diseasenegative Renal ROS  negative genitourinary   Musculoskeletal negative musculoskeletal ROS (+) Arthritis -, Osteoarthritis,    Abdominal   Peds negative pediatric ROS (+)  Hematology negative hematology ROS (+)   Anesthesia Other Findings   Reproductive/Obstetrics negative OB ROS                             Anesthesia Physical Anesthesia Plan  ASA: III  Anesthesia Plan: General   Post-op Pain Management:    Induction: Intravenous  Airway Management Planned: LMA  Additional Equipment:   Intra-op Plan:   Post-operative Plan: Extubation in OR  Informed Consent: I have reviewed the patients History and Physical, chart, labs and discussed the procedure including the risks, benefits and alternatives for the proposed anesthesia with the patient or authorized representative who has indicated his/her understanding and acceptance.   Dental advisory given  Plan Discussed with: CRNA and Surgeon  Anesthesia Plan Comments:         Anesthesia Quick Evaluation

## 2014-10-24 NOTE — Progress Notes (Signed)
Pt here today for follow up regarding mantle cell lymphoma; seen in ER on 5/10; c/o left leg swelling and abdominal discomfort

## 2014-10-24 NOTE — Op Note (Signed)
NAME:  Caleb Francis, Caleb Francis NO.:  [id number]  MEDICAL RECORD NO.:  [id number]  LOCATION:  113A                         FACILITY:  [facility]  PHYSICIAN:  Marshall Cork. Jeffie Pollock, M.D.    DATE OF BIRTH:  1945/09/21  DATE OF PROCEDURE:  10/24/2014 DATE OF DISCHARGE:                              OPERATIVE REPORT   PROCEDURE PERFORMED:  Cystoscopy with urethral dilation.  Left retrograde pyelogram with placement of a left double-J stent.  SURGEON:  Marshall Cork. Jeffie Pollock, M.D.  PREOPERATIVE DIAGNOSIS:  Left retrograde peritoneal lymphadenopathy with hydronephrosis and pain.  POSTOPERATIVE DIAGNOSIS:  Left retrograde peritoneal lymphadenopathy with hydronephrosis and pain, with bulbar urethral stricture.  SURGEON:  Marshall Cork. Jeffie Pollock, MD  ANESTHESIA:  General.  SPECIMEN:  DRAINS:  A 6-French 26 cm, left contour double-J stent.  BLOOD LOSS:  Minimal.  COMPLICATIONS:  None.  INDICATIONS:  Caleb Francis is a 69 year old African-American male with a history of recurrent mantle cell lymphoma who had the onset Monday of severe left flank pain.  He was found on CT to have bulky left retroperitoneal adenopathy with hydronephrosis.  It was felt that decompression was indicated.  Initially, I felt that a perc would be most appropriate because of the appearance of the bulky adenopathy, but on review by IR it was felt the ureter draped over and did not go through the lymph nodes, so a stent attempt was most appropriate in light of his need for future chemotherapy.  DESCRIPTION OF PROCEDURE:  He was taken to the operating room where he was given 2 g of Ancef.  A general anesthetic was induced.  He was placed in a lithotomy position.  His perineum and genitalia were prepped with Betadine solution.  He was draped in the usual sterile fashion.  Cystoscopy was performed with a 22-French scope and 30-degree lens.  He was noted to have a moderate stricture in the bulbar urethra that would not admit the  scope.  He was then dilated to 24-French with Leander Rams sounds without difficulty.  The scope was then advanced through the prostate and the bladder.  His external sphincter was intact.  His prostate was short with mild bilobar hyperplasia without significant obstruction.  Examination of the bladder revealed mild trabeculation. The left ureteral orifice was in the normal anatomic position, but the right ureteral orifice was elevated superiorly and medially by the tumor mass.  No mucosal lesions were identified.  The left ureteral orifice was cannulated with a 5-French open-end catheter.  An initial attempt to perform a retrograde pyelogram was not possible because of malfunction of fluoroscopy.  However, I was able to negotiate a Sensor wire through the open-end catheter and advanced it easily to what was felt to be the location of the renal pelvis based on the marks on the open-end catheter, which was then advanced over the wire.  The wire was backed out.  The open-end catheter was backed back until hydronephrotic drip was noted.  Some contrast was instilled to ensure that I could irrigate the system and was present in the collecting system, since fluoroscopy was not functioning at that time. I then replaced the wire and removed the open-end catheter and placed  a 6-French 26-cm contour double-J stent without string to the kidney.  The wire was removed, leaving a good coil in the bladder.  By the time I had completed the procedure, the fluoroscopy had been rebooted, and we were able to take an image.  The stent coil was in the moderately dilated renal collecting system, which had no obvious filling defects.  At this point, the bladder was drained.  A 16-French coude Foley catheter was inserted.  The balloon was filled with 10 cc of sterile fluid.  The catheter was connected to straight drainage.  The patient was taken down from lithotomy position.  His anesthetic was reversed.  He was  moved to the recovery room in stable condition.  There were no complications.     Marshall Cork. Jeffie Pollock, M.D.     JJW/MEDQ  D:  10/24/2014  T:  10/24/2014  Job:  [id number]

## 2014-10-24 NOTE — Telephone Encounter (Signed)
-----   Message from Vivia Birmingham, RN sent at 10/23/2014  5:50 PM EDT ----- Regarding: Phone call from [facility]... Becky with [facility], [place]. [phone], ext [phone] called to see if pt is still on Ibrutinib 140 mg capsules.Caleb KitchenMarland KitchenThey have not received a new prescription for him; and if he is no longer taking this medication would like to know the reason why.Caleb KitchenMarland Francis

## 2014-10-24 NOTE — Brief Op Note (Signed)
10/24/2014  9:26 PM  PATIENT:  Caleb Francis  69 y.o. male  PRE-OPERATIVE DIAGNOSIS:  left hydronephrosis  POST-OPERATIVE DIAGNOSIS: same PROCEDURE:  Procedure(s): CYSTOSCOPY WITH RETROGRADE PYELOGRAM/URETERAL STENT PLACEMENT (Left)  SURGEON:  Surgeon(s) and Role:    * Irine Seal, MD - Primary  PHYSICIAN ASSISTANT:   ASSISTANTS: none   ANESTHESIA:   general  EBL:  Total I/O In: 125 [I.V.:125] Out: 200 [Urine:200]  BLOOD ADMINISTERED:none  DRAINS: Urinary Catheter (Foley) and left 6 x 26 contour JJ stent   LOCAL MEDICATIONS USED:  NONE  SPECIMEN:  No Specimen  DISPOSITION OF SPECIMEN:  N/A  COUNTS:  YES  TOURNIQUET:  * No tourniquets in log *  DICTATION: .Other Dictation: Dictation Number [id number]  PLAN OF CARE: Admit to inpatient   PATIENT DISPOSITION:  PACU - hemodynamically stable.   Delay start of Pharmacological VTE agent (>24hrs) due to surgical blood loss or risk of bleeding: not applicable

## 2014-10-24 NOTE — Telephone Encounter (Signed)
Not yet, requested all information to be sent to [facility]

## 2014-10-24 NOTE — Anesthesia Postprocedure Evaluation (Signed)
  Anesthesia Post-op Note  Patient: Caleb Francis  Procedure(s) Performed: Procedure(s): CYSTOSCOPY WITH RETROGRADE PYELOGRAM/URETERAL STENT PLACEMENT (Left)  Anesthesia type:General  Patient location: PACU  Post pain: Pain level controlled  Post assessment: Post-op Vital signs reviewed, Patient's Cardiovascular Status Stable, Respiratory Function Stable, Patent Airway and No signs of Nausea or vomiting  Post vital signs: Reviewed and stable  Last Vitals:  Filed Vitals:   10/24/14 2151  BP: 116/81  Pulse: 99  Temp:   Resp: 13    Level of consciousness: awake, alert  and patient cooperative  Complications: No apparent anesthesia complications

## 2014-10-24 NOTE — Transfer of Care (Signed)
Immediate Anesthesia Transfer of Care Note  Patient: Caleb Francis  Procedure(s) Performed: Procedure(s): CYSTOSCOPY WITH RETROGRADE PYELOGRAM/URETERAL STENT PLACEMENT (Left)  Patient Location: PACU  Anesthesia Type:General  Level of Consciousness: awake  Airway & Oxygen Therapy: Patient Spontanous Breathing and Patient connected to nasal cannula oxygen  Post-op Assessment: Report given to RN and Post -op Vital signs reviewed and stable  Post vital signs: Reviewed and stable  Last Vitals:  Filed Vitals:   10/24/14 2135  BP: 113/91  Pulse: 105  Temp: 38 C  Resp: 16    Complications: No apparent anesthesia complications

## 2014-10-24 NOTE — Plan of Care (Addendum)
Problem: Discharge Progression Outcomes Goal: Other Discharge Outcomes/Goals Outcome: Progressing Plan of care progress:  -Pt NPO at this time -PRN pain meds given for pain relief, right abd pain -urology to consult with pt

## 2014-10-24 NOTE — H&P (Signed)
Day of Surgery  Subjective: I was asked to see [name] in consultation by Dr. Mike Gip for left flank pain with hydronephrosis secondary to bulky retroperitoneal adenopathy from a recurrent mantle cell lymphoma.  The pain began Monday and is severe with nausea and vomiting but no hematuria or voiding complaints.   He has no prior GU history.  ROS:  Review of Systems  Constitutional: Negative for fever and chills.  Respiratory: Negative for shortness of breath.   Cardiovascular: Positive for leg swelling. Negative for chest pain.  Gastrointestinal: Positive for nausea, vomiting and abdominal pain. Negative for diarrhea and constipation.  Genitourinary: Positive for flank pain. Negative for hematuria.  All other systems reviewed and are negative.  No Known Allergies  Past Medical History  Diagnosis Date  . Arthritis   . Cancer   . Hypertension     History reviewed. No pertinent past surgical history.  History   Social History  . Marital Status: Single    Spouse Name: N/A  . Number of Children: N/A  . Years of Education: N/A   Occupational History  . Not on file.   Social History Main Topics  . Smoking status: Former Smoker -- 0.50 packs/day for 60 years    Types: Cigarettes    Quit date: 10/18/2014  . Smokeless tobacco: Not on file  . Alcohol Use: No  . Drug Use: No  . Sexual Activity: Not on file   Other Topics Concern  . Not on file   Social History Narrative    Family History  Problem Relation Age of Onset  . Cancer Mother   . Cancer Father    PFSHx reviewed and updated.  Anti-infectives: Anti-infectives    Start     Dose/Rate Route Frequency Ordered Stop   10/24/14 1714  ceFAZolin (ANCEF) IVPB 2 g/50 mL premix     2 g 100 mL/hr over 30 Minutes Intravenous 30 min pre-op 10/24/14 1714        Current Facility-Administered Medications  Medication Dose Route Frequency Provider Last Rate Last Dose  . 0.9 %  sodium chloride infusion   Intravenous  Continuous Evlyn Kanner, NP 100 mL/hr at 10/24/14 1907    . acetaminophen (TYLENOL) tablet 650 mg  650 mg Oral Q4H PRN Evlyn Kanner, NP      . amLODipine (NORVASC) tablet 5 mg  5 mg Oral Daily Evlyn Kanner, NP      . ceFAZolin (ANCEF) IVPB 2 g/50 mL premix  2 g Intravenous 30 min Pre-Op Irine Seal, MD      . morphine 4 MG/ML injection 2 mg  2 mg Intravenous Q2H PRN Evlyn Kanner, NP   2 mg at 10/24/14 1916  . senna-docusate (Senokot-S) tablet 1 tablet  1 tablet Oral QHS PRN Evlyn Kanner, NP      . zolpidem (AMBIEN) tablet 5 mg  5 mg Oral QHS PRN Evlyn Kanner, NP         Objective: Vital signs in last 24 hours: Temp:  [98.9 F (37.2 C)-99.7 F (37.6 C)] 98.9 F (37.2 C) (05/12 1816) Pulse Rate:  [92-97] 92 (05/12 1816) Resp:  [18] 18 (05/12 1705) BP: (130-138)/(76-82) 130/76 mmHg (05/12 1816) SpO2:  [100 %] 100 % (05/12 1816) Weight:  [74.844 kg (165 lb)-76.8 kg (169 lb 5 oz)] 74.844 kg (165 lb) (05/12 1816)  Intake/Output from previous day:   Intake/Output this shift: Total I/O In: 125 [I.V.:125] Out: 200 [Urine:200]   Physical Exam  Constitutional: He is well-developed, well-nourished, and in no distress.  HENT:  Head: Normocephalic and atraumatic.  Neck: Normal range of motion. Neck supple.  Cardiovascular: Normal rate, regular rhythm and normal heart sounds.   Pulmonary/Chest: Effort normal and breath sounds normal. No respiratory distress.  Abdominal: Soft. Bowel sounds are normal. He exhibits no distension. There is tenderness. in the left abdomen and flank. Lymphadenopathy:    He has no cervical adenopathy or supraclavicular adenopathy. MS: + LE edema without tenderness. Neuro: A&O x 3.  No motor or sensory deficits. Psych: Mood and affect normal.   Vitals reviewed. AVSS   Lab Results:   Recent Labs  10/22/14 2130 10/24/14 1721  WBC 8.0 7.3  HGB 12.4* 12.4*  HCT 36.9* 37.6*  PLT 94* 92*   BMET  Recent Labs  10/22/14 2130  10/24/14 1721  NA 136 134*  K 3.0* 3.0*  CL 101 95*  CO2 26 27  GLUCOSE 112* 100*  BUN 9 15  CREATININE 0.95 1.16  CALCIUM 8.9 9.0   PT/INR  Recent Labs  10/24/14 1721  LABPROT 15.0  INR 1.16   ABG No results for input(s): PHART, HCO3 in the last 72 hours.  Invalid input(s): PCO2, PO2  Studies/Results: Ct Abdomen Pelvis W Contrast  10/23/2014   CLINICAL DATA:  Swelling to the left upper thigh since last night. Left lower abdominal pain. History of lymphoma with chemotherapy 2015.  EXAM: CT ABDOMEN AND PELVIS WITH CONTRAST  TECHNIQUE: Multidetector CT imaging of the abdomen and pelvis was performed using the standard protocol following bolus administration of intravenous contrast.  CONTRAST:  123mL OMNIPAQUE IOHEXOL 300 MG/ML SOLN, 37mL OMNIPAQUE IOHEXOL 240 MG/ML SOLN  COMPARISON:  PET-CT scan 09/16/2014  FINDINGS: Dependent atelectasis in the lung bases.  Cholelithiasis with multiple stones in the gallbladder. No gallbladder wall thickening or infiltration. Multiple accessory spleens. Liver, spleen, pancreas, adrenal glands, and inferior vena cava are unremarkable. Calcification of the abdominal aorta without aneurysm. There is fairly prominent retroperitoneal and periaortic lymphadenopathy extending all the way down into the pelvis with diffuse bilateral iliac chain lymphadenopathy, greater on the left. Left periaortic lymph nodes measure up to 5.9 by 3.5 cm diameter. Left pelvic lymph nodes measure up to 5 by 4.2 cm diameter. Scattered lymph nodes in the celiac axis are upper limits of normal in size. Lymphadenopathy demonstrated in the left groin. Diffuse soft tissue edema in the left upper leg without discrete fluid collection. There is hydronephrosis and hydroureter on the left probably due to extrinsic compression from left pelvic lymphadenopathy. Lymph nodes in all areas demonstrate enlargement since the previous PET-CT scan. Findings suggest progression of lymphoma.  Stomach, small  bowel, and colon are not abnormally distended. No free air or free fluid in the abdomen. Abdominal wall musculature appears intact.  Pelvis: Prostate gland is not enlarged. Bladder wall is mildly thickened, possibly indicating infection. Appendix is normal. No evidence of diverticulitis. Degenerative changes of the spine. No destructive bone lesions.  IMPRESSION: Significant interval enlargement of diffuse retroperitoneal, bilateral pelvic, and left groin lymph nodes. Lymphadenopathy is most prominent on the left. Diffuse edema demonstrated in the left upper leg. Left renal hydronephrosis and hydroureter probably due to extrinsic compression by lymphadenopathy. Findings suggest progression of lymphoma.   Electronically Signed   By: Lucienne Capers M.D.   On: 10/23/2014 01:29   US Venous Img Lower Unilateral Left  10/22/2014   CLINICAL DATA:  Left leg swelling for 2 days. Patient is on aspirin.  EXAM: Left LOWER EXTREMITY VENOUS DOPPLER ULTRASOUND  TECHNIQUE: Gray-scale sonography with graded compression, as well as color Doppler and duplex ultrasound were performed to evaluate the lower extremity deep venous systems from the level of the common femoral vein and including the common femoral, femoral, profunda femoral, popliteal and calf veins including the posterior tibial, peroneal and gastrocnemius veins when visible. The superficial great saphenous vein was also interrogated. Spectral Doppler was utilized to evaluate flow at rest and with distal augmentation maneuvers in the common femoral, femoral and popliteal veins.  COMPARISON:  None.  FINDINGS: Contralateral Common Femoral Vein: Respiratory phasicity is normal and symmetric with the symptomatic side. No evidence of thrombus. Normal compressibility.  Common Femoral Vein: No evidence of thrombus. Normal compressibility, respiratory phasicity and response to augmentation.  Saphenofemoral Junction: No evidence of thrombus. Normal compressibility and flow on  color Doppler imaging.  Profunda Femoral Vein: No evidence of thrombus. Normal compressibility and flow on color Doppler imaging.  Femoral Vein: No evidence of thrombus. Normal compressibility, respiratory phasicity and response to augmentation.  Popliteal Vein: No evidence of thrombus. Normal compressibility, respiratory phasicity and response to augmentation.  Calf Veins: Peroneal vein is not visualized. Visualized posterior tibial vein demonstrates no evidence of thrombus. Normal compressibility and flow on color Doppler imaging.  Superficial Great Saphenous Vein: No evidence of thrombus. Normal compressibility and flow on color Doppler imaging.  Venous Reflux:  None.  Other Findings: Incidental note of prominent lymph nodes in the left groin area. Central flow and fatty hilum are demonstrated. This is likely reactive.  IMPRESSION: No evidence of deep venous thrombosis.   Electronically Signed   By: Lucienne Capers M.D.   On: 10/22/2014 22:45  Labs and CT films and report reviewed.  Case discussed with Dr. Mike Gip and Dr. Laurence Ferrari. Assessment: s/p Procedure(s): CYSTOSCOPY WITH RETROGRADE PYELOGRAM/URETERAL STENT PLACEMENT Malignant left ureteral obstruction with pain.  Plan: I am going to attempt to place a left ureteral stent and if unsuccessful, he will need to have a perc placed. I reviewed the risks of bleeding, infection, ureteral and bladder injury, need for secondary procedures and sequential stent changes, thrombotic events and anesthetic risks.   CC: Dr. Nolon Stalls.    LOS: 0 days    Claude Swendsen J 10/24/2014

## 2014-10-24 NOTE — Discharge Instructions (Signed)
Ureteral Stent Implantation Ureteral stent implantation is the implantation of a soft plastic tube with multiple holes into the tube that drains urine from your kidney to your bladder (ureter). The stent helps drain your kidney when there is a blockage of the flow of urine in your ureter. The stent has a coil on each end to keep it from falling out. One end stays in the kidney. The other end stays in the bladder. It is most often taken out after any blockage has been removed or your ureter has healed. Short-term stents have a string attached to make removal quite easy. Removal of a short-term stent can be done in your health care provider's office or by you at home. Long-term stents need to be changed every few months. LET [facility] CARE PROVIDER KNOW ABOUT:  Any allergies you have.  All medicines you are taking, including vitamins, herbs, eye drops, creams, and over-the-counter medicines.  Previous problems you or members of your family have had with the use of anesthetics.  Any blood disorders you have.  Previous surgeries you have had.  Medical conditions you have. RISKS AND COMPLICATIONS Generally, ureteral stent implantation is a safe procedure. However, as with any procedure, complications can occur. Possible complications include:  Movement of the stent away from where it was originally placed (migration). This may affect the ability of the stent to properly drain your kidney. If migration of the stent occurs, the stent may need to be replaced or repositioned.  Perforation of the ureter.  Infection. BEFORE THE PROCEDURE  You may be asked to wash your genital area with sterile soap the morning of your procedure.  You may be given an oral antibiotic which you should take with a sip of water as prescribed by your health care provider.  You may be asked to not eat or drink for 8 hours before the surgery. PROCEDURE  First you will be given an anesthetic so you do not feel pain  during the procedure.  Your health care provider will insert a special lighted instrument called a cystoscope into your bladder. This allows your health care provider to see the opening to your ureter.  A thin wire is carefully threaded into your bladder and up the ureter. The stent is inserted over the wire and the wire is then removed.  Your bladder will be emptied of urine. AFTER THE PROCEDURE You will be taken to a recovery room until it is okay for you to go home. Document Released: 05/28/2000 Document Revised: 06/05/2013 Document Reviewed: 11/07/2012 [facility] Patient Information 2015 [place], [place]. This information is not intended to replace advice given to you by your health care provider. Make sure you discuss any questions you have with your health care provider.

## 2014-10-24 NOTE — Telephone Encounter (Signed)
  Any follow-up from the transplant team?  [facility]

## 2014-10-24 NOTE — Progress Notes (Signed)
Patient ID: Caleb Francis, male   DOB: [birth year], 69 y.o.   MRN: [id number]       I have spoken to Dr. Mike Gip and reviewed the CT and initially suggested that IR place a left Perc.  I then spoke with Dr. Laurence Ferrari who felt the ureter was draped over the nodes and not incased as I had thought and that with the planned chemo next week that it would be better to attempt a stent cystoscopically than to put in a perc which might make him more prone to infectious complications.     I have added the patient on the schedule for 8 pm and will come to see him in consultation prior to the procedure.

## 2014-10-24 NOTE — H&P (Signed)
[facility]  Inpatient day:  10/24/2014  Chief Complaint: Caleb Francis is an 69 y.o. male with progressive mantle cell lymphoma who is admitted from the medical oncology clinic with intractable pain associated with left sided hydronephrosis.  HPI: The patient is a 69 year old gentleman with a history of stage IIIB mantle cell lymphoma.  He did not have a baseline bone marrow.  He presented with extensive denopathy.  Right cervical lymph node biopsy on 03/13/2013 confirmed mantle cell lymphoma.   He received 6 cycles of Bendamustine/Rituxan from 03/15/2013 - 07/13/2013.  He received 2 additional infusions of Rituxan (08/10/2013 and 10/01/2013).  He had a partial response (75% decrease in adenopathy).  He then received 6 cycles of RCHOP from 10/22/2013 - 02/11/2014.  He had a dose adjustment in vincristine because of constipation and lower extremity neuropathy.    PET/CT on 03/14/2014 demonstrated a partial response with further significant but incomplete metabolic response to therapy in the neck, chest, abdomen, and pelvis.  Chest, abdomen, and pelvic CT scan on 05/24/2014 revealed stable mild axillary adenopathy, no mediastinal or pulmonary masses, mild retroperitoneal and iliac pelvic adenopathy (1.2 cm) which had decreased in volume slightly.  He has a history of chronic hypokalemia of unclear etiology.  He has been taking his potassium "off and on".  He has renal insufficiency.  Renal ultrasound on 08/20/2014 revealed no hydronephrosis.  He began Ibrutinib in 02/2014.  PET scan on 09/16/2014 reveals early progressive disease (size, numer and hypermetabolism of lymph nodes).  He completed his remaining Ibrutinib with plans for salvage chemotherapy and referral for possible transplant at the [facility].  The patient presented to the emergency room last evening with left lower abdominal pain.  Imaging studies revealed significant enlargement of diffuse  retroperitoneal, bilateral pelvic, and left groin adenopathy.  There was diffuse edema in the left leg.  There was left hydronephrosis and hydroureter.  Creatinine was normal.  Duplex revealed no evidence of DVT.  He was seen in the medical oncology clinic today.  It was felt that he was unable to be discharged home to follow-up with urology in the outpatient department secondary to severe pain.  Past Medical History  Diagnosis Date  . Arthritis   . Cancer   . Hypertension    No past surgical history on file.  Family History  Problem Relation Age of Onset  . Cancer Mother   . Cancer Father    Social History:  reports that he quit smoking 6 days ago. His smoking use included Cigarettes. He has a 30 pack-year smoking history. He does not have any smokeless tobacco history on file. He reports that he does not drink alcohol or use illicit drugs.  Allergies: No Known Allergies  Medications Prior to Admission  Medication Sig Dispense Refill  . amLODipine (NORVASC) 10 MG tablet Take 10 mg by mouth.    . folic acid (FOLVITE) 1 MG tablet Take 1 mg by mouth.    [name] KLOR-CON M10 10 MEQ tablet     . meloxicam (MOBIC) 7.5 MG tablet Take 7.5 mg by mouth.    . oxyCODONE (OXY IR/ROXICODONE) 5 MG immediate release tablet Take 1 tablet (5 mg total) by mouth every 4 (four) hours as needed for severe pain. 30 tablet 0  . predniSONE (DELTASONE) 5 MG tablet Take 5 mg by mouth.      Review of Systems: GENERAL:  Feels bad.  Decreased activity secondary to pain.  No fevers,  sweats or weight loss. PERFORMANCE STATUS (ECOG):  2 HEENT:  No visual changes, runny nose, sore throat, mouth sores or tenderness. Lungs: No shortness of breath or cough.  No hemoptysis. Cardiac:  No chest pain, palpitations, orthopnea, or PND. GI:  Left lower quadrant abdominal pain.  No nausea, vomiting, diarrhea, constipation, melena or hematochezia. GU:  No change in urine output. No urgency, frequency, dysuria, or  hematuria. Musculoskeletal:  No lower back pain.  No joint pain.  No muscle tenderness. Extremities:  No pain or swelling. Skin:  No rashes or skin changes. Neuro:  No headache, numbness or weakness, balance or coordination issues. Endocrine:  No diabetes, thyroid issues, hot flashes or night sweats. Psych:  No mood changes, depression or anxiety. Pain:  Abdominal pain (left side). Review of systems:  All other systems reviewed and found to be negative.  Physical Exam:  Blood pressure 130/76, temperature 98.9 F (37.2 C), temperature source Oral, resp. rate 18, height _0  (1.753 m), weight 165 lb 4 oz (74.957 kg), SpO2 100 %.  GENERAL:  Well developed, well nourished, uncomfortable and at times doubled over in pain. MENTAL STATUS:  Alert and oriented to person, place and time. HEAD:  Short gray hair.  Normocephalic, atraumatic, face symmetric, no Cushingoid features. EYES:  Brown eyes.  Pupils equal round and reactive to light and accomodation.  No conjunctivitis or scleral icterus. ENT:  Oropharynx clear without lesion.  Tongue normal. Mucous membranes moist.  RESPIRATORY:  Clear to auscultation without rales, wheezes or rhonchi. CARDIOVASCULAR:  Regular rate and rhythm without murmur, rub or gallop. ABDOMEN:  Soft, extensive fullness/ mass left lower quadrant extending to bladder.  Slightly tender to palpation.  No guarding or rebound tenderness.  Active bowel sounds, and no hepatosplenomegaly.  SKIN:  No rashes, ulcers or lesions. EXTREMITIES: Left lower extremity edema extending into thigh.  No skin discoloration or tenderness.  No palpable cords. LYMPH NODES: Left axillary node 5-6 cm.  Left groin mass-like fullness. No palpable cervical, supraclavicular adenopathy. NEUROLOGICAL: Unremarkable. PSYCH:  Appropriate.  Results for orders placed or performed during the hospital encounter of 10/23/14 (from the past 48 hour(s))  CBC with Differential     Status: Abnormal   Collection  Time: 10/22/14  9:30 PM  Result Value Ref Range   WBC 8.0 3.8 - 10.6 K/uL   RBC 4.37 (L) 4.40 - 5.90 MIL/uL   Hemoglobin 12.4 (L) 13.0 - 18.0 g/dL   HCT 36.9 (L) 40.0 - 52.0 %   MCV 84.4 80.0 - 100.0 fL   MCH 28.3 26.0 - 34.0 pg   MCHC 33.5 32.0 - 36.0 g/dL   RDW 20.6 (H) 11.5 - 14.5 %   Platelets 94 (L) 150 - 440 K/uL   Neutrophils Relative % 28 (L) 43 - 77 %   Lymphocytes Relative 17 12 - 46 %   Monocytes Relative 14 (H) 3 - 12 %   Eosinophils Relative 0 0 - 5 %   Basophils Relative 0 0 - 1 %   Band Neutrophils 31 (H) 0 - 10 %   Metamyelocytes Relative 9 %   Myelocytes 1 %   Promyelocytes Absolute 0 %   Blasts 0 %   nRBC 0 0 /100 WBC   Other 0 %   Neutro Abs 5.5 1.7 - 7.7 K/uL   Lymphs Abs 1.4 0.7 - 4.0 K/uL   Monocytes Absolute 1.1 (H) 0.1 - 1.0 K/uL   Eosinophils Absolute 0.0 0.0 - 0.7 K/uL  Basophils Absolute 0.0 0.0 - 0.1 K/uL   RBC Morphology MIXED RBC POPULATION     Comment: TARGET CELLS   WBC Morphology PELGEROID CHANGES    Smear Review PLATELETS APPEAR DECREASED   Comprehensive metabolic panel     Status: Abnormal   Collection Time: 10/22/14  9:30 PM  Result Value Ref Range   Sodium 136 135 - 145 mmol/L   Potassium 3.0 (L) 3.5 - 5.1 mmol/L   Chloride 101 101 - 111 mmol/L   CO2 26 22 - 32 mmol/L   Glucose, Bld 112 (H) 65 - 99 mg/dL   BUN 9 6 - 20 mg/dL   Creatinine, Ser 0.95 0.61 - 1.24 mg/dL   Calcium 8.9 8.9 - 10.3 mg/dL   Total Protein 7.5 6.5 - 8.1 g/dL   Albumin 3.6 3.5 - 5.0 g/dL   AST 15 15 - 41 U/L   ALT 8 (L) 17 - 63 U/L   Alkaline Phosphatase 63 38 - 126 U/L   Total Bilirubin 1.0 0.3 - 1.2 mg/dL   GFR calc non Af Amer >60 >60 mL/min   GFR calc Af Amer >60 >60 mL/min    Comment: (NOTE) The eGFR has been calculated using the CKD EPI equation. This calculation has not been validated in all clinical situations. eGFR's persistently <60 mL/min signify possible Chronic Kidney Disease.    Anion gap 9 5 - 15  Lipase, blood     Status: None    Collection Time: 10/22/14  9:30 PM  Result Value Ref Range   Lipase 26 22 - 51 U/L  Urinalysis complete, with microscopic [facility])     Status: Abnormal   Collection Time: 10/22/14  9:30 PM  Result Value Ref Range   Color, Urine YELLOW (A) YELLOW   APPearance HAZY (A) CLEAR   Glucose, UA NEGATIVE NEGATIVE mg/dL   Bilirubin Urine NEGATIVE NEGATIVE   Ketones, ur NEGATIVE NEGATIVE mg/dL   Specific Gravity, Urine 1.015 1.005 - 1.030   Hgb urine dipstick 1+ (A) NEGATIVE   pH 6.0 5.0 - 8.0   Protein, ur 30 (A) NEGATIVE mg/dL   Nitrite NEGATIVE NEGATIVE   Leukocytes, UA 1+ (A) NEGATIVE   RBC / HPF 0-5 0 - 5 RBC/hpf   WBC, UA 6-30 0 - 5 WBC/hpf   Bacteria, UA RARE (A) NONE SEEN   Squamous Epithelial / LPF 0-5 (A) NONE SEEN   Mucous PRESENT    Hyaline Casts, UA PRESENT    Ct Abdomen Pelvis W Contrast  10/23/2014   CLINICAL DATA:  Swelling to the left upper thigh since last night. Left lower abdominal pain. History of lymphoma with chemotherapy 2015.  EXAM: CT ABDOMEN AND PELVIS WITH CONTRAST  TECHNIQUE: Multidetector CT imaging of the abdomen and pelvis was performed using the standard protocol following bolus administration of intravenous contrast.  CONTRAST:  124m OMNIPAQUE IOHEXOL 300 MG/ML SOLN, 253mOMNIPAQUE IOHEXOL 240 MG/ML SOLN  COMPARISON:  PET-CT scan 09/16/2014  FINDINGS: Dependent atelectasis in the lung bases.  Cholelithiasis with multiple stones in the gallbladder. No gallbladder wall thickening or infiltration. Multiple accessory spleens. Liver, spleen, pancreas, adrenal glands, and inferior vena cava are unremarkable. Calcification of the abdominal aorta without aneurysm. There is fairly prominent retroperitoneal and periaortic lymphadenopathy extending all the way down into the pelvis with diffuse bilateral iliac chain lymphadenopathy, greater on the left. Left periaortic lymph nodes measure up to 5.9 by 3.5 cm diameter. Left pelvic lymph nodes measure up to 5 by 4.2 cm diameter.  Scattered lymph nodes in the celiac axis are upper limits of normal in size. Lymphadenopathy demonstrated in the left groin. Diffuse soft tissue edema in the left upper leg without discrete fluid collection. There is hydronephrosis and hydroureter on the left probably due to extrinsic compression from left pelvic lymphadenopathy. Lymph nodes in all areas demonstrate enlargement since the previous PET-CT scan. Findings suggest progression of lymphoma.  Stomach, small bowel, and colon are not abnormally distended. No free air or free fluid in the abdomen. Abdominal wall musculature appears intact.  Pelvis: Prostate gland is not enlarged. Bladder wall is mildly thickened, possibly indicating infection. Appendix is normal. No evidence of diverticulitis. Degenerative changes of the spine. No destructive bone lesions.  IMPRESSION: Significant interval enlargement of diffuse retroperitoneal, bilateral pelvic, and left groin lymph nodes. Lymphadenopathy is most prominent on the left. Diffuse edema demonstrated in the left upper leg. Left renal hydronephrosis and hydroureter probably due to extrinsic compression by lymphadenopathy. Findings suggest progression of lymphoma.   Electronically Signed   By: Lucienne Capers M.D.   On: 10/23/2014 01:29   US Venous Img Lower Unilateral Left  10/22/2014   CLINICAL DATA:  Left leg swelling for 2 days. Patient is on aspirin.  EXAM: Left LOWER EXTREMITY VENOUS DOPPLER ULTRASOUND  TECHNIQUE: Gray-scale sonography with graded compression, as well as color Doppler and duplex ultrasound were performed to evaluate the lower extremity deep venous systems from the level of the common femoral vein and including the common femoral, femoral, profunda femoral, popliteal and calf veins including the posterior tibial, peroneal and gastrocnemius veins when visible. The superficial great saphenous vein was also interrogated. Spectral Doppler was utilized to evaluate flow at rest and with distal  augmentation maneuvers in the common femoral, femoral and popliteal veins.  COMPARISON:  None.  FINDINGS: Contralateral Common Femoral Vein: Respiratory phasicity is normal and symmetric with the symptomatic side. No evidence of thrombus. Normal compressibility.  Common Femoral Vein: No evidence of thrombus. Normal compressibility, respiratory phasicity and response to augmentation.  Saphenofemoral Junction: No evidence of thrombus. Normal compressibility and flow on color Doppler imaging.  Profunda Femoral Vein: No evidence of thrombus. Normal compressibility and flow on color Doppler imaging.  Femoral Vein: No evidence of thrombus. Normal compressibility, respiratory phasicity and response to augmentation.  Popliteal Vein: No evidence of thrombus. Normal compressibility, respiratory phasicity and response to augmentation.  Calf Veins: Peroneal vein is not visualized. Visualized posterior tibial vein demonstrates no evidence of thrombus. Normal compressibility and flow on color Doppler imaging.  Superficial Great Saphenous Vein: No evidence of thrombus. Normal compressibility and flow on color Doppler imaging.  Venous Reflux:  None.  Other Findings: Incidental note of prominent lymph nodes in the left groin area. Central flow and fatty hilum are demonstrated. This is likely reactive.  IMPRESSION: No evidence of deep venous thrombosis.   Electronically Signed   By: Lucienne Capers M.D.   On: 10/22/2014 22:45    Assessment:  The patient is a 69 y.o. gentleman with progressive mantle cell lymphoma and intractable left sided abdominal pain.  Imaging studies reveal left sided hydronephrosis and hydroureter.  Plan:   1)  Admit for pain management. 2)  NPO.  IVF. 3)  Check CBC, coags, electrolytes, and tumor lysis labs.[name] 4)  Consult urology for stent placement.  If unable to place stent, consider external nephrostomy tube (discussed with interventional radiology and urology). 5)  Consult hospitalist for medical  management. 6)  Anticipate salvage chemotherapy (RICE) next week then  follow-up with [facility] transplant team.  If responsive disease, transplant after 2 cycles. 7)  Code status- full.  Discussed with patient.   Lequita Asal, MD  10/24/2014, 5:27 PM

## 2014-10-25 ENCOUNTER — Encounter: Payer: Self-pay | Admitting: Internal Medicine

## 2014-10-25 ENCOUNTER — Inpatient Hospital Stay: Payer: Medicare Other

## 2014-10-25 LAB — CBC WITH DIFFERENTIAL/PLATELET
Band Neutrophils: 15 % — ABNORMAL HIGH (ref 0–10)
Basophils Absolute: 0 10*3/uL (ref 0.0–0.1)
Basophils Relative: 0 % (ref 0–1)
Blasts: 0 %
Eosinophils Absolute: 0.2 10*3/uL (ref 0.0–0.7)
Eosinophils Relative: 3 % (ref 0–5)
HCT: 32.6 % — ABNORMAL LOW (ref 40.0–52.0)
Hemoglobin: 11.4 g/dL — ABNORMAL LOW (ref 13.0–18.0)
Lymphocytes Relative: 20 % (ref 12–46)
Lymphs Abs: 1.5 10*3/uL (ref 0.7–4.0)
MCH: 29.3 pg (ref 26.0–34.0)
MCHC: 34.8 g/dL (ref 32.0–36.0)
MCV: 84.2 fL (ref 80.0–100.0)
Metamyelocytes Relative: 16 %
Monocytes Absolute: 1.4 10*3/uL — ABNORMAL HIGH (ref 0.1–1.0)
Monocytes Relative: 19 % — ABNORMAL HIGH (ref 3–12)
Myelocytes: 5 %
Neutro Abs: 4.3 10*3/uL (ref 1.7–7.7)
Neutrophils Relative %: 22 % — ABNORMAL LOW (ref 43–77)
Other: 0 %
Platelets: 81 10*3/uL — ABNORMAL LOW (ref 150–440)
Promyelocytes Absolute: 0 %
RBC: 3.87 MIL/uL — ABNORMAL LOW (ref 4.40–5.90)
RDW: 20.2 % — ABNORMAL HIGH (ref 11.5–14.5)
WBC: 7.4 10*3/uL (ref 3.8–10.6)
nRBC: 0 /100 WBC

## 2014-10-25 LAB — BASIC METABOLIC PANEL
Anion gap: 11 (ref 5–15)
BUN: 15 mg/dL (ref 6–20)
CO2: 24 mmol/L (ref 22–32)
Calcium: 7.8 mg/dL — ABNORMAL LOW (ref 8.9–10.3)
Chloride: 99 mmol/L — ABNORMAL LOW (ref 101–111)
Creatinine, Ser: 1.07 mg/dL (ref 0.61–1.24)
GFR calc Af Amer: >60 mL/min (ref >60)
GFR calc non Af Amer: >60 mL/min (ref >60)
Glucose, Bld: 121 mg/dL — ABNORMAL HIGH (ref 65–99)
Potassium: 2.6 mmol/L — CL (ref 3.5–5.1)
Sodium: 134 mmol/L — ABNORMAL LOW (ref 135–145)

## 2014-10-25 LAB — PATHOLOGIST SMEAR REVIEW

## 2014-10-25 LAB — MAGNESIUM: Magnesium: 1.9 mg/dL (ref 1.7–2.4)

## 2014-10-25 MED ORDER — POTASSIUM CHLORIDE 10 MEQ/100ML IV SOLN
10.0000 meq | INTRAVENOUS | Status: AC
  Administered 2014-10-25 – 2014-10-26 (×4): 10 meq via INTRAVENOUS
  Filled 2014-10-25 (×4): qty 100

## 2014-10-25 MED ORDER — SODIUM CHLORIDE 0.9 % IV SOLN
Freq: Once | INTRAVENOUS | Status: AC
  Administered 2014-10-25: 09:00:00 via INTRAVENOUS
  Filled 2014-10-25 (×2): qty 20

## 2014-10-25 MED ORDER — ALUM & MAG HYDROXIDE-SIMETH 200-200-20 MG/5ML PO SUSP
30.0000 mL | Freq: Four times a day (QID) | ORAL | Status: DC | PRN
  Administered 2014-10-25 – 2014-10-30 (×4): 30 mL via ORAL
  Filled 2014-10-25 (×5): qty 30

## 2014-10-25 NOTE — Progress Notes (Signed)
Spoke to Dr. Grayland Ormond regarding potassium of 2.7 - critical lab result. Per MD via telephone with readback - order 40 mEq IV potassium. Madlyn Frankel, RN

## 2014-10-25 NOTE — Care Management (Signed)
Admitted to [facility] with the diagnosis of hydronephrosis of left kidney. Lives alone. So is [place]  [phone]). No home health. No skilled facility. No home oxygen. Uses a cane to aide in ambulation. See a physician at [facility] in [place].  Cystoscopy with stent placement 10/24/14. Foley in place. Potassium level 2.6 this morning. Shelbie Ammons RN MSN Care Management [phone]

## 2014-10-25 NOTE — Progress Notes (Signed)
Initial Nutrition Assessment  INTERVENTION:  Meals and Snacks: Cater to patient preferences Medical Food Supplement Therapy:  Will recommend on follow if intake poor  NUTRITION DIAGNOSIS:  Inadequate oral intake related to other (see comment) (acute illness) as evidenced by meal completion < 25%.  GOAL:  Patient will meet greater than or equal to 90% of their needs  MONITOR:  Energy Intake Digestive system Electroyte and renal Profile UOP  REASON FOR ASSESSMENT:  Malnutrition Screening Tool    ASSESSMENT:  Pt admitted with abdominal pain secondary to hydronephrosis. Pt s/p cystoscopy and stent placement yesterday PMHx:  Past Medical History  Diagnosis Date  . Arthritis   . Hypertension   . RA (rheumatoid arthritis)   . Anemia   . Cancer     lymphoma   PO Intake: pt reports eating bites of lunch today secondary to indigesttion and only half of breakfast. Pt reports usually has a good appetite especially breakfast .  Medications: NS at 167mL/hr  Labs: Electrolyte and Renal Profile:    Recent Labs Lab 10/22/14 2130 10/24/14 1721 10/25/14 0549  BUN 9 15 15   CREATININE 0.95 1.16 1.07  NA 136 134* 134*  K 3.0* 3.0* 2.6*  MG  --   --  1.9   Glucose Profile: No results for input(s): GLUCAP in the last 72 hours. Protein Profile:  Recent Labs Lab 10/22/14 2130 10/24/14 1721  ALBUMIN 3.6 3.5    Pt reports stable weight PTA.  Height:  Ht Readings from Last 1 Encounters:  10/24/14 5\' 9"  (1.753 m)    Weight:  Wt Readings from Last 1 Encounters:  10/24/14 165 lb (74.844 kg)    Ideal Body Weight:     Wt Readings from Last 10 Encounters:  10/24/14 165 lb (74.844 kg)  10/24/14 169 lb 5 oz (76.8 kg)  10/22/14 168 lb (76.204 kg)    BMI:  Body mass index is 24.36 kg/(m^2).  Diet Order:  Diet regular Room service appropriate?: Yes; Fluid consistency:: Thin  EDUCATION NEEDS:  No education needs identified at this time   Intake/Output  Summary (Last 24 hours) at 10/25/14 1508 Last data filed at 10/25/14 0900  Gross per 24 hour  Intake   2100 ml  Output   1501 ml  Net    599 ml    Last BM:  5/11   [place], RD, LDN Pager [phone]

## 2014-10-25 NOTE — Progress Notes (Signed)
[facility] Hematology/Oncology Progress Note  Date of admission: 10/24/2014  Hospital day:  10/25/2014  Chief Complaint: Caleb Francis is a 69 y.o. male with progressive mantle cell lymphoma who was admitted with intractable pain associated with left sided hydronephrosis.  Subjective:  Feels much better today after stent placement last night.  Eating.  Some indigestion.  No nausea or vomiting.  Social History: The patient is alone today  Allergies: No Known Allergies  Scheduled Medications: . amLODipine  5 mg Oral Daily   Review of Systems: GENERAL:  Feels much better.  No fevers, sweats or weight loss. PERFORMANCE STATUS (ECOG):  2 HEENT:  No visual changes, runny nose, sore throat, mouth sores or tenderness. Lungs: No shortness of breath or cough.  No hemoptysis. Cardiac:  No chest pain, palpitations, orthopnea, or PND. GI:  Left sided pain improved.  Indigestion.  No nausea, vomiting, diarrhea, constipation, melena or hematochezia. GU:  Increased urine output since stent placed.  No urgency, frequency, dysuria, or hematuria. Musculoskeletal:  No back pain.  No joint pain.  No muscle tenderness. Extremities:  Swelling in left leg unchanged. Skin:  No rashes or skin changes. Neuro:  No headache, numbness or weakness, balance or coordination issues. Endocrine:  No diabetes, thyroid issues, hot flashes or night sweats. Psych:  No mood changes, depression or anxiety. Pain:  Left sided abdominal pain, improved. Review of systems:  All other systems reviewed and found to be negative.  Physical Exam: Blood pressure 128/74, pulse 99, temperature 98.9 F (37.2 C), temperature source Oral, resp. rate 16, height _0  (1.753 m), weight 165 lb (74.844 kg), SpO2 96 %.  GENERAL:  Well developed, well nourished, sitting comfortably in the exam room in no acute distress. MENTAL STATUS:  Alert and oriented to person, place and time. HEAD:  Normocephalic, atraumatic,  face symmetric, no Cushingoid features. EYES:  Brown eyes.  Pupils equal round and reactive to light and accomodation.  No conjunctivitis or scleral icterus. ENT:  Oropharynx clear without lesion.  Tongue normal. Mucous membranes moist.  RESPIRATORY:  Clear to auscultation without rales, wheezes or rhonchi. CARDIOVASCULAR:  Regular rate and rhythm without murmur, rub or gallop. ABDOMEN:  Soft, non-tender, with active bowel sounds, and no hepatosplenomegaly.  No masses. BACK:  No CVA tenderness.   SKIN:  No rashes, ulcers or lesions. EXTREMITIES: Left lower extremity edema, unchanged.  No palpable cords. LYMPH NODES: No palpable cervical, supraclavicular, axillary or inguinal adenopathy  NEUROLOGICAL: Unremarkable. PSYCH:  Appropriate.  Results for orders placed or performed during the hospital encounter of 10/24/14 (from the past 48 hour(s))  CBC WITH DIFFERENTIAL     Status: Abnormal   Collection Time: 10/24/14  5:21 PM  Result Value Ref Range   WBC 7.3 3.8 - 10.6 K/uL   RBC 4.43 4.40 - 5.90 MIL/uL   Hemoglobin 12.4 (L) 13.0 - 18.0 g/dL   HCT 37.6 (L) 40.0 - 52.0 %   MCV 84.8 80.0 - 100.0 fL   MCH 28.0 26.0 - 34.0 pg   MCHC 33.1 32.0 - 36.0 g/dL   RDW 20.3 (H) 11.5 - 14.5 %   Platelets 92 (L) 150 - 440 K/uL   Neutrophils Relative % 17 %   Lymphocytes Relative 27 %   Monocytes Relative 19 %   Eosinophils Relative 3 %   Basophils Relative 0 %   Band Neutrophils 17 %   Metamyelocytes Relative 10 %   Myelocytes 5 %   Promyelocytes Absolute 2 %  Neutro Abs 3.7 1.4 - 6.5 K/uL   Lymphs Abs 2.0 1.0 - 3.6 K/uL   Monocytes Absolute 1.4 (H) 0.2 - 1.0 K/uL   Eosinophils Absolute 0.2 0 - 0.7 K/uL   Basophils Absolute 0.0 0 - 0.1 K/uL   Smear Review      PLATELET CLUMPS NOTED ON SMEAR, COUNT APPEARS ADEQUATE  Comprehensive metabolic panel     Status: Abnormal   Collection Time: 10/24/14  5:21 PM  Result Value Ref Range   Sodium 134 (L) 135 - 145 mmol/L   Potassium 3.0 (L) 3.5 - 5.1  mmol/L   Chloride 95 (L) 101 - 111 mmol/L   CO2 27 22 - 32 mmol/L   Glucose, Bld 100 (H) 65 - 99 mg/dL   BUN 15 6 - 20 mg/dL   Creatinine, Ser 1.16 0.61 - 1.24 mg/dL   Calcium 9.0 8.9 - 10.3 mg/dL   Total Protein 7.4 6.5 - 8.1 g/dL   Albumin 3.5 3.5 - 5.0 g/dL   AST 19 15 - 41 U/L   ALT 9 (L) 17 - 63 U/L   Alkaline Phosphatase 79 38 - 126 U/L   Total Bilirubin 2.1 (H) 0.3 - 1.2 mg/dL   GFR calc non Af Amer >60 >60 mL/min   GFR calc Af Amer >60 >60 mL/min    Comment: (NOTE) The eGFR has been calculated using the CKD EPI equation. This calculation has not been validated in all clinical situations. eGFR's persistently <60 mL/min signify possible Chronic Kidney Disease.    Anion gap 12 5 - 15  Protime-INR     Status: None   Collection Time: 10/24/14  5:21 PM  Result Value Ref Range   Prothrombin Time 15.0 11.4 - 15.0 seconds   INR 1.16   Uric acid     Status: None   Collection Time: 10/24/14  5:21 PM  Result Value Ref Range   Uric Acid, Serum 6.7 4.4 - 7.6 mg/dL  CBC with Differential     Status: Abnormal   Collection Time: 10/25/14  5:49 AM  Result Value Ref Range   WBC 7.4 3.8 - 10.6 K/uL   RBC 3.87 (L) 4.40 - 5.90 MIL/uL   Hemoglobin 11.4 (L) 13.0 - 18.0 g/dL   HCT 32.6 (L) 40.0 - 52.0 %   MCV 84.2 80.0 - 100.0 fL   MCH 29.3 26.0 - 34.0 pg   MCHC 34.8 32.0 - 36.0 g/dL   RDW 20.2 (H) 11.5 - 14.5 %   Platelets 81 (L) 150 - 440 K/uL   Neutrophils Relative % 22 (L) 43 - 77 %   Lymphocytes Relative 20 12 - 46 %   Monocytes Relative 19 (H) 3 - 12 %   Eosinophils Relative 3 0 - 5 %   Basophils Relative 0 0 - 1 %   Band Neutrophils 15 (H) 0 - 10 %   Metamyelocytes Relative 16 %   Myelocytes 5 %   Promyelocytes Absolute 0 %   Blasts 0 %   nRBC 0 0 /100 WBC   Other 0 %   Neutro Abs 4.3 1.7 - 7.7 K/uL   Lymphs Abs 1.5 0.7 - 4.0 K/uL   Monocytes Absolute 1.4 (H) 0.1 - 1.0 K/uL   Eosinophils Absolute 0.2 0.0 - 0.7 K/uL   Basophils Absolute 0.0 0.0 - 0.1 K/uL   RBC  Morphology MIXED RBC POPULATION    Smear Review LARGE PLATELETS PRESENT     Comment: PENDING PATHOLOGIST REVIEW PLATELETS  APPEAR DECREASED   Basic metabolic panel     Status: Abnormal   Collection Time: 10/25/14  5:49 AM  Result Value Ref Range   Sodium 134 (L) 135 - 145 mmol/L   Potassium 2.6 (LL) 3.5 - 5.1 mmol/L    Comment: CRITICAL RESULT CALLED TO, READ BACK BY AND VERIFIED WITH MEGAN OAKLEY _0  10/25/14 BY AJO    Chloride 99 (L) 101 - 111 mmol/L   CO2 24 22 - 32 mmol/L   Glucose, Bld 121 (H) 65 - 99 mg/dL   BUN 15 6 - 20 mg/dL   Creatinine, Ser 1.07 0.61 - 1.24 mg/dL   Calcium 7.8 (L) 8.9 - 10.3 mg/dL   GFR calc non Af Amer >60 >60 mL/min   GFR calc Af Amer >60 >60 mL/min    Comment: (NOTE) The eGFR has been calculated using the CKD EPI equation. This calculation has not been validated in all clinical situations. eGFR's persistently <60 mL/min signify possible Chronic Kidney Disease.    Anion gap 11 5 - 15   No results found.  Assessment:  Caleb Francis is a 69 y.o. male with progressive mantle cell lymphoma admitted with intractable left sided abdominal pain. Imaging studies reveal left sided hydronephrosis and hydroureter.  Patient post operative day #1 with marked improvement in pain.  Plan: 1. Hematology/Oncology-  Counts adequate.  Some platelet clumping, but suspect involvement of bone marrow with mantle cell lymphoma.  Anticipate outpatient chemotherapy ([facility]ICE) next week. Start allopurinol prior to discharge to prevent tumor lysis issues. 2. Fluids/electrolytes/nutrition-  Potassium low.  Potassium supplimented this morning.  Repeat K check after infusion complete.  Decrease IVF as tolerated.  Patient eating. 3. Pain- currently on IV morphine prn.  Will switch to oral pain medications to ensure pain under good control prior to discharge. 4. Disposition-  Anticipate discharge tomorrow with follow-up in clinic early next week for chemo.  Lequita Asal, MD   10/25/2014, 1:01 PM

## 2014-10-25 NOTE — Progress Notes (Signed)
Urology Consult Follow Up  Subjective: POD 1 s/p left ureteral stent. Pain improved this AM.  Voiding without difficulty.    Anti-infectives: Anti-infectives    Start     Dose/Rate Route Frequency Ordered Stop   10/24/14 1714  ceFAZolin (ANCEF) IVPB 2 g/50 mL premix  Status:  Discontinued     2 g 100 mL/hr over 30 Minutes Intravenous 30 min pre-op 10/24/14 1714 10/24/14 2235      Current Facility-Administered Medications  Medication Dose Route Frequency Provider Last Rate Last Dose  . 0.9 %  sodium chloride infusion   Intravenous Continuous Evlyn Kanner, NP 100 mL/hr at 10/24/14 2247    . acetaminophen (TYLENOL) tablet 650 mg  650 mg Oral Q4H PRN Evlyn Kanner, NP      . amLODipine (NORVASC) tablet 5 mg  5 mg Oral Daily Evlyn Kanner, NP      . morphine 4 MG/ML injection 2 mg  2 mg Intravenous Q2H PRN Evlyn Kanner, NP   2 mg at 10/25/14 0155  . potassium chloride 40 mEq in sodium chloride 0.9 % 500 mL injection   Intravenous Once Lequita Asal, MD      . senna-docusate (Senokot-S) tablet 1 tablet  1 tablet Oral QHS PRN Evlyn Kanner, NP      . zolpidem (AMBIEN) tablet 5 mg  5 mg Oral QHS PRN Evlyn Kanner, NP         Objective: Vital signs in last 24 hours: Temp:  [98.4 F (36.9 C)-100.4 F (38 C)] 98.9 F (37.2 C) (05/13 0519) Pulse Rate:  [80-106] 99 (05/13 0519) Resp:  [13-21] 16 (05/12 2210) BP: (113-138)/(74-91) 128/74 mmHg (05/13 0519) SpO2:  [96 %-100 %] 97 % (05/13 0519) Weight:  [74.844 kg (165 lb)-76.8 kg (169 lb 5 oz)] 74.844 kg (165 lb) (05/12 1816)  Intake/Output from previous day: 05/12 0701 - 05/13 0700 In: 1025 [I.V.:1025] Out: 301 [Urine:300; Blood:1] Intake/Output this shift: Total I/O In: 835 [I.V.:835] Out: -    Physical Exam  Constitutional: He is oriented to person, place, and time and well-developed, well-nourished, and in no distress.  Neck: Neck supple.  Pulmonary/Chest: Effort normal and breath sounds normal. No  respiratory distress.  Abdominal: Soft. He exhibits no distension.  No CVA tenderness  Musculoskeletal: He exhibits no edema.  Neurological: He is alert and oriented to person, place, and time.  Skin: Skin is warm and dry.    Lab Results:   Recent Labs  10/24/14 1721 10/25/14 0549  WBC 7.3 7.4  HGB 12.4* 11.4*  HCT 37.6* 32.6*  PLT 92* 81*   BMET  Recent Labs  10/24/14 1721 10/25/14 0549  NA 134* 134*  K 3.0* 2.6*  CL 95* 99*  CO2 27 24  GLUCOSE 100* 121*  BUN 15 15  CREATININE 1.16 1.07  CALCIUM 9.0 7.8*   PT/INR  Recent Labs  10/24/14 1721  LABPROT 15.0  INR 1.16   ABG No results for input(s): PHART, HCO3 in the last 72 hours.  Invalid input(s): PCO2, PO2  Studies/Results: No results found.   Assessment: s/p Procedure(s): POD 1 s/p CYSTOSCOPY WITH RETROGRADE PYELOGRAM/URETERAL STENT PLACEMENT for left hydronephrosis presumably related to mantle cell lymphoma who is admitted from the medical oncology clinic with intractable pain associated with left sided hydronephrosis.  Pain now improved.    Plan: -advised to f/u in Urology in 2 months for to arrange next stent exchange in 3 months -normal stent related symptoms  were discussed today -Urology to sign off, please page with any questions for concerns  Please include the following in the discharge summary:  You have a ureteral stent in place.  This is a tube that extends from your kidney to your bladder.  This may cause urinary bleeding, burning with urination, and urinary frequency.  Please call our office or present to the ED if you develop fevers >101 or pain which is not able to be controlled with oral pain medications.  You may be given either Flomax and/ or ditropan to help with bladder spasms and stent pain in addition to pain medications.    [place] [address] [place], [place] [address] [phone]    LOS: 1 day    Hollice Espy 10/25/2014

## 2014-10-25 NOTE — Plan of Care (Signed)
Problem: Discharge Progression Outcomes Goal: Other Discharge Outcomes/Goals Plan of care progress to goal:   Patient transported to OR at 2020 for Cystoscopy - stent placed left side. Foley placed in OR. IV fluids infusing at 100 ml/hr. Morphine PRN given, relief noted. Diet advanced to regular, tolerating well. Patient resting quietly at this time.

## 2014-10-25 NOTE — Consult Note (Cosign Needed)
[facility] HOSPITALIST  Medical Consultation  Caleb Francis YTK:[id number] DOB: September 25, 1945 DOA: 10/24/2014 PCP: No PCP Per Patient   Requesting physician: Irine Seal Date of consultation: 10/24/2014 Reason for consultation: Opinion regarding hypokalemia, management of hypertension  CHIEF COMPLAINT: Admitted with left flank pain with hydronephrosis secondary to bulky retroperitoneal lymphadenopathy from recurrent mantle cell lymphoma.  \ISTORY OF PRESENT ILLNESS: Caleb Francis  is a 69 y.o. male with a known history of mantle cell lymphoma who is admitted by urology for a hydronephrosis. Patient underwent a left ureteral stent placement. He was noted to be hypokalemic. He reports that he is feeling a little nauseous and has been having burping, complains of some heartburn as well. Denies any chest pain or shortness of breath no vomiting or diarrhea. Patient has been receiving oral potassium supplement and currently is on IV potassium 1.  PAST MEDICAL HISTORY:   Past Medical History  Diagnosis Date  . Arthritis   . Hypertension   . RA (rheumatoid arthritis)   . Anemia   . Cancer     lymphoma    PAST SURGICAL HISTORY:  Past Surgical History  Procedure Laterality Date  . Back surgery    . Fracture surgery      ankle  . Portacath placement      SOCIAL HISTORY:  History  Substance Use Topics  . Smoking status: Former Smoker -- 0.50 packs/day for 60 years    Types: Cigarettes    Quit date: 10/18/2014  . Smokeless tobacco: Not on file  . Alcohol Use: No    FAMILY HISTORY:  Family History  Problem Relation Age of Onset  . Cancer Mother     breast  . Cancer Father     bone cancer    DRUG ALLERGIES: No Known Allergies  REVIEW OF SYSTEMS:   CONSTITUTIONAL: No fever, fatigue or weakness.  EYES: No blurred or double vision.  EARS, NOSE, AND THROAT: No tinnitus or ear pain.  RESPIRATORY: No cough, shortness of breath, wheezing or hemoptysis.  CARDIOVASCULAR: No chest  pain, orthopnea, edema.  GASTROINTESTINAL: + Nausea, vomiting diarrhea abdominal pain or hematemesis or hematochezia.  GENITOURINARY: No dysuria, hematuria.  ENDOCRINE: No polyuria, nocturia,  HEMATOLOGY: +anemia, easy bruising or bleeding , has a history of lymphoma SKIN: No rash or lesion. MUSCULOSKELETAL: No joint pain or arthritis.   NEUROLOGIC: No tingling, numbness, weakness.  PSYCHIATRY: No anxiety or depression.   MEDICATIONS AT HOME:  Prior to Admission medications   Medication Sig Start Date End Date Taking? Authorizing Provider  amLODipine (NORVASC) 10 MG tablet Take 10 mg by mouth.   Yes Historical Provider, MD  folic acid (FOLVITE) 1 MG tablet Take 1 mg by mouth.   Yes Historical Provider, MD  KLOR-CON M10 10 MEQ tablet  07/17/14  Yes Historical Provider, MD  meloxicam (MOBIC) 7.5 MG tablet Take 7.5 mg by mouth.   Yes Historical Provider, MD  oxyCODONE (OXY IR/ROXICODONE) 5 MG immediate release tablet Take 1 tablet (5 mg total) by mouth every 4 (four) hours as needed for severe pain. 10/24/14  Yes Lequita Asal, MD  predniSONE (DELTASONE) 5 MG tablet Take 5 mg by mouth.   Yes Historical Provider, MD      PHYSICAL EXAMINATION:   VITAL SIGNS: Blood pressure 128/74, pulse 99, temperature 98.9 F (37.2 C), temperature source Oral, resp. rate 16, height 5\' 9"  (1.753 m), weight 74.844 kg (165 lb), SpO2 96 %.  GENERAL:  69 y.o.-year-old patient lying in the bed with  no acute distress.  EYES: Pupils equal, round, reactive to light and accommodation. No scleral icterus. Extraocular muscles intact.  HEENT: Head atraumatic, normocephalic. Oropharynx and nasopharynx clear.  NECK:  Supple, no jugular venous distention. No thyroid enlargement, no tenderness.  LUNGS: Normal breath sounds bilaterally, no wheezing, rales,rhonchi or crepitation. No use of accessory muscles of respiration.  CARDIOVASCULAR: S1, S2 normal. No murmurs, rubs, or gallops.  ABDOMEN: Soft, mildly distended.  Bowel sounds diminished No organomegaly or mass.  EXTREMITIES: No pedal edema, cyanosis, or clubbing.  NEUROLOGIC: Cranial nerves II through XII are intact. Muscle strength 5/5 in all extremities. Sensation intact. Gait not checked.  PSYCHIATRIC: The patient is alert and oriented x 3.  SKIN: No obvious rash, lesion, or ulcer.   LABORATORY PANEL:   CBC  Recent Labs Lab 10/22/14 2130 10/24/14 1721 10/25/14 0549  WBC 8.0 7.3 7.4  HGB 12.4* 12.4* 11.4*  HCT 36.9* 37.6* 32.6*  PLT 94* 92* 81*  MCV 84.4 84.8 84.2  MCH 28.3 28.0 29.3  MCHC 33.5 33.1 34.8  RDW 20.6* 20.3* 20.2*  LYMPHSABS 1.4 2.0 1.5  MONOABS 1.1* 1.4* 1.4*  EOSABS 0.0 0.2 0.2  BASOSABS 0.0 0.0 0.0   ------------------------------------------------------------------------------------------------------------------  Chemistries   Recent Labs Lab 10/22/14 2130 10/24/14 1721 10/25/14 0549  NA 136 134* 134*  K 3.0* 3.0* 2.6*  CL 101 95* 99*  CO2 26 27 24   GLUCOSE 112* 100* 121*  BUN 9 15 15   CREATININE 0.95 1.16 1.07  CALCIUM 8.9 9.0 7.8*  AST 15 19  --   ALT 8* 9*  --   ALKPHOS 63 79  --   BILITOT 1.0 2.1*  --    ------------------------------------------------------------------------------------------------------------------ estimated creatinine clearance is 67.9 mL/min (by C-G formula based on Cr of 1.07). ------------------------------------------------------------------------------------------------------------------ No results for input(s): TSH, T4TOTAL, T3FREE, THYROIDAB in the last 72 hours.  Invalid input(s): FREET3   Coagulation profile  Recent Labs Lab 10/24/14 1721  INR 1.16   ------------------------------------------------------------------------------------------------------------------- No results for input(s): DDIMER in the last 72 hours. -------------------------------------------------------------------------------------------------------------------  Cardiac Enzymes No  results for input(s): CKMB, TROPONINI, MYOGLOBIN in the last 168 hours.  Invalid input(s): CK ------------------------------------------------------------------------------------------------------------------ Invalid input(s): POCBNP  ---------------------------------------------------------------------------------------------------------------  Urinalysis    Component Value Date/Time   COLORURINE YELLOW* 10/22/2014 2130   APPEARANCEUR HAZY* 10/22/2014 2130   LABSPEC 1.015 10/22/2014 2130   PHURINE 6.0 10/22/2014 2130   GLUCOSEU NEGATIVE 10/22/2014 2130   HGBUR 1+* 10/22/2014 2130   [place] NEGATIVE 10/22/2014 2130   [place] NEGATIVE 10/22/2014 2130   PROTEINUR 30* 10/22/2014 2130   NITRITE NEGATIVE 10/22/2014 2130   LEUKOCYTESUR 1+* 10/22/2014 2130     RADIOLOGY: No results found.  EKG: No orders found for this or any previous visit.  IMPRESSION AND PLAN: 1. Hypokalemia patient has received oral supplement as well as IV supplements. I will check a magnesium level and repeat his potassium level later today.  2. Some nausea and mild abdominal distention ; to his recent procedure I will order an abdominal x-ray start him on Maalox when necessary.  3. Hypertension blood pressure appears to be stable continue Norvasc as taking at home.  4. Mental cell lymphoma: Needs outpatient oncology follow-up  CODE STATUS:    Code Status Orders        Start     Ordered   10/24/14 1710  Full code   Continuous     10/24/14 1709     Thank you for the consultation  TOTAL TIME TAKING CARE  OF THIS PATIENT: 55 minutes.    Dustin Flock M.D on 10/25/2014 at 12:53 PM  Between 7am to 6pm - Pager - [phone]  After 6pm go to [email or web address] - password [email or web address] [facility]  [place] [facility]  Office  [phone]  CC: Primary care physician; No PCP Per Patient

## 2014-10-25 NOTE — Progress Notes (Signed)
Dr. Mike Gip notified of critical lab - potassium 2.6 - 40 mEq NACL 0.9% 572ml injection ordered. Madlyn Frankel, RN

## 2014-10-25 NOTE — Plan of Care (Signed)
Problem: Discharge Progression Outcomes Goal: Other Discharge Outcomes/Goals Outcome: Progressing Plan of care progress:  -continues IV fluids, monitor labs -foley cath discontinued this am per orders, voiding without difficulty -complaints of pain, PRN pain meds per orders with relief -tolerates diet

## 2014-10-26 LAB — BASIC METABOLIC PANEL
ANION GAP: 8 (ref 5–15)
BUN: 10 mg/dL (ref 6–20)
CHLORIDE: 102 mmol/L (ref 101–111)
CO2: 26 mmol/L (ref 22–32)
CREATININE: 0.88 mg/dL (ref 0.61–1.24)
Calcium: 8 mg/dL — ABNORMAL LOW (ref 8.9–10.3)
GFR calc non Af Amer: >60 mL/min (ref >60)
Glucose, Bld: 107 mg/dL — ABNORMAL HIGH (ref 65–99)
Potassium: 3 mmol/L — ABNORMAL LOW (ref 3.5–5.1)
SODIUM: 136 mmol/L (ref 135–145)

## 2014-10-26 LAB — POTASSIUM: Potassium: 3.1 mmol/L — ABNORMAL LOW (ref 3.5–5.1)

## 2014-10-26 MED ORDER — SENNA 8.6 MG PO TABS
1.0000 | ORAL_TABLET | Freq: Every day | ORAL | Status: DC
  Administered 2014-10-26 – 2014-11-01 (×7): 8.6 mg via ORAL
  Filled 2014-10-26 (×7): qty 1

## 2014-10-26 MED ORDER — DOCUSATE SODIUM 100 MG PO CAPS
100.0000 mg | ORAL_CAPSULE | Freq: Two times a day (BID) | ORAL | Status: DC
  Administered 2014-10-26 – 2014-11-01 (×12): 100 mg via ORAL
  Filled 2014-10-26 (×13): qty 1

## 2014-10-26 MED ORDER — POTASSIUM CHLORIDE IN NACL 20-0.9 MEQ/L-% IV SOLN
INTRAVENOUS | Status: DC
  Administered 2014-10-26 – 2014-10-28 (×4): via INTRAVENOUS
  Filled 2014-10-26 (×6): qty 1000

## 2014-10-26 MED ORDER — MAGNESIUM OXIDE 400 (241.3 MG) MG PO TABS
400.0000 mg | ORAL_TABLET | Freq: Two times a day (BID) | ORAL | Status: AC
  Administered 2014-10-26 (×2): 400 mg via ORAL
  Filled 2014-10-26 (×2): qty 1

## 2014-10-26 MED ORDER — OXYCODONE-ACETAMINOPHEN 5-325 MG PO TABS
1.0000 | ORAL_TABLET | ORAL | Status: DC | PRN
  Administered 2014-10-26: 16:00:00 1 via ORAL
  Administered 2014-10-26 – 2014-10-31 (×10): 2 via ORAL
  Filled 2014-10-26: qty 1
  Filled 2014-10-26 (×10): qty 2

## 2014-10-26 NOTE — Progress Notes (Signed)
MD made aware of pt potassium result, MD to place orders

## 2014-10-26 NOTE — Plan of Care (Signed)
Problem: Discharge Progression Outcomes Goal: Other Discharge Outcomes/Goals Plan of care progress to goal:  IV fluids infusing as ordered. Patient voiding in urinal at bedside without difficulty. PRN pain medications given, relief noted. Tolerating diet. Critical potassium level of 2.7 - 40 mEq IV potassium given. Labs to be drawn this AM.

## 2014-10-26 NOTE — Progress Notes (Signed)
Patient ID: Caleb Francis, male   DOB: 1945-06-16, 69 y.o.   MRN: [id number]  [place] [facility] [place] NAME: Mckale Haffey    MR#:  [id number]  DATE OF BIRTH:  [birth year]  SUBJECTIVE:  CHIEF COMPLAINT:  Follow up for hypokalemia, c/o pain in L abdomen, good urinary output Review of Systems  Constitutional: Negative for fever, chills, weight loss and malaise/fatigue.  Eyes: Negative for blurred vision and double vision.  Respiratory: Negative for cough and hemoptysis.   Cardiovascular: Negative for chest pain and palpitations.  Gastrointestinal: Positive for nausea, abdominal pain and constipation.  Genitourinary: Negative for dysuria and urgency.  Musculoskeletal: Negative for myalgias.  Skin: Negative for rash.  Neurological: Negative for dizziness, tingling and tremors.  Psychiatric/Behavioral: Negative for depression.    VITAL SIGNS: Blood pressure 150/82, pulse 106, temperature 99.3 F (37.4 C), temperature source Oral, resp. rate 18, height 5\' 9"  (1.753 m), weight 74.844 kg (165 lb), SpO2 97 %.  PHYSICAL EXAMINATION:   GENERAL:  69 y.o.-year-old patient lying in the bed with no acute distress.  EYES: Pupils equal, round, reactive to light and accommodation. No scleral icterus. Extraocular muscles intact.  HEENT: Head atraumatic, normocephalic. Oropharynx and nasopharynx clear.  NECK:  Supple, no jugular venous distention. No thyroid enlargement, no tenderness.  LUNGS: Normal breath sounds bilaterally, no wheezing, rales,rhonchi or crepitation. No use of accessory muscles of respiration.  CARDIOVASCULAR: S1, S2 normal. No murmurs, rubs, or gallops.  ABDOMEN: Soft, L sided tenderness, , nondistended. Bowel sounds present. No organomegaly or mass.  EXTREMITIES: No pedal edema, cyanosis, or clubbing.  NEUROLOGIC: Cranial nerves II through XII are intact. Muscle strength 5/5 in all extremities. Sensation intact. Gait not  checked.  PSYCHIATRIC: The patient is alert and oriented x 3.  SKIN: No obvious rash, lesion, or ulcer.   ORDERS/RESULTS REVIEWED:   CBC  Recent Labs Lab 10/22/14 2130 10/24/14 1721 10/25/14 0549  WBC 8.0 7.3 7.4  HGB 12.4* 12.4* 11.4*  HCT 36.9* 37.6* 32.6*  PLT 94* 92* 81*  MCV 84.4 84.8 84.2  MCH 28.3 28.0 29.3  MCHC 33.5 33.1 34.8  RDW 20.6* 20.3* 20.2*  LYMPHSABS 1.4 2.0 1.5  MONOABS 1.1* 1.4* 1.4*  EOSABS 0.0 0.2 0.2  BASOSABS 0.0 0.0 0.0   ------------------------------------------------------------------------------------------------------------------  Chemistries   Recent Labs Lab 10/22/14 2130 10/24/14 1721 10/25/14 0549 10/25/14 2014 10/26/14 0606  NA 136 134* 134*  --  136  K 3.0* 3.0* 2.6* 2.7* 3.0*  CL 101 95* 99*  --  102  CO2 26 27 24   --  26  GLUCOSE 112* 100* 121*  --  107*  BUN 9 15 15   --  10  CREATININE 0.95 1.16 1.07  --  0.88  CALCIUM 8.9 9.0 7.8*  --  8.0*  MG  --   --  1.9  --   --   AST 15 19  --   --   --   ALT 8* 9*  --   --   --   ALKPHOS 63 79  --   --   --   BILITOT 1.0 2.1*  --   --   --    ------------------------------------------------------------------------------------------------------------------ estimated creatinine clearance is 82.6 mL/min (by C-G formula based on Cr of 0.88). ------------------------------------------------------------------------------------------------------------------ No results for input(s): TSH, T4TOTAL, T3FREE, THYROIDAB in the last 72 hours.  Invalid input(s): FREET3  Cardiac Enzymes No results for input(s): CKMB, TROPONINI, MYOGLOBIN in the  last 168 hours.  Invalid input(s): CK ------------------------------------------------------------------------------------------------------------------ Invalid input(s): POCBNP ---------------------------------------------------------------------------------------------------------------  RADIOLOGY: Dg Abd 2 Views  10/25/2014   CLINICAL DATA:   Ureteral stent  EXAM: ABDOMEN - 2 VIEW  COMPARISON:  CT 10/23/2014  FINDINGS: Double-J left ureteral stent extends from the left renal pelvis to the bladder in expected location. Oral contrast from recent CT noted within the colon. Mild bibasilar atelectasis.  IMPRESSION: Left ureteral stent in expected location.   Electronically Signed   By: Suzy Bouchard M.D.   On: 10/25/2014 13:42    EKG: No orders found for this or any previous visit.  ASSESSMENT AND PLAN:  1. Hypokalemia, continue  oral as well as IV supplements. Replenish  magnesium level and repeat his potassium level later today. Some better today  2. Some nausea and mild abdominal distention ; constipated, start colace, senna, follow  PO intake, awaiting for xrays 3. Hypertension blood pressure appears to be stable continue Norvasc as taking at home.  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   DRUG ALLERGIES: No Known Allergies  CODE STATUS:     Code Status Orders        Start     Ordered   10/24/14 1710  Full code   Continuous     10/24/14 1709      TOTAL TIME TAKING CARE OF THIS PATIENT: 40  minutes.    Theodoro Grist M.D on 10/26/2014 at 10:11 AM  Between 7am to 6pm - Pager - [phone]  After 6pm go to [email or web address] - password [email or web address] [facility]  [place] [facility]  Office  [phone]  CC: Primary care physician; No PCP Per Patient

## 2014-10-26 NOTE — Progress Notes (Signed)
[facility] Hematology/Oncology Progress Note  Date of admission: 10/24/2014  Hospital day:  10/26/2014  Chief Complaint: Caleb Francis is a 69 y.o. male with progressive mantle cell lymphoma who was admitted with intractable pain associated with left sided hydronephrosis.  Subjective:  Continues to have amber colored urine with some pain, but otherwise feels well. He has a good appetite and denies any nausea. He denies any fevers. Patient offers no further complaints.    Social History: The patient is alone today  Allergies: No Known Allergies  Scheduled Medications: . amLODipine  5 mg Oral Daily  . docusate sodium  100 mg Oral BID  . magnesium oxide  400 mg Oral BID  . senna  1 tablet Oral Daily   Review of Systems: GENERAL:  No fevers, sweats or weight loss. PERFORMANCE STATUS (ECOG):  2 Lungs: No shortness of breath or cough.  No hemoptysis. Cardiac:  No chest pain, palpitations, orthopnea, or PND. GI:  Left sided pain improved.  Indigestion.  No nausea, vomiting, diarrhea, constipation, melena or hematochezia. Musculoskeletal:  No back pain.  No joint pain.  No muscle tenderness. Extremities:  Swelling in left leg unchanged. Skin:  No rashes or skin changes. Neuro:  No headache, numbness or weakness, balance or coordination issues. Endocrine:  No diabetes, thyroid issues, hot flashes or night sweats. Psych:  No mood changes, depression or anxiety. Pain:  Left sided abdominal pain, improved.  Review of systems:  All other systems reviewed and found to be negative.  Physical Exam: Blood pressure 137/75, pulse 106, temperature 99.8 F (37.7 C), temperature source Oral, resp. rate 18, height 5' 9"  (1.753 m), weight 165 lb (74.844 kg), SpO2 98 %.  GENERAL:  Well developed, well nourished, sitting comfortably in the exam room in no acute distress. MENTAL STATUS:  Alert and oriented to person, place and time. HEAD:  Normocephalic, atraumatic, face  symmetric, no Cushingoid features. RESPIRATORY:  Clear to auscultation without rales, wheezes or rhonchi. CARDIOVASCULAR:  Regular rate and rhythm without murmur, rub or gallop. ABDOMEN:  Soft, non-tender, with active bowel sounds, and no hepatosplenomegaly.  No masses. BACK:  No CVA tenderness.   SKIN:  No rashes, ulcers or lesions. EXTREMITIES: Left lower extremity edema, unchanged.  No palpable cords. LYMPH NODES: No palpable cervical, supraclavicular, axillary or inguinal adenopathy  NEUROLOGICAL: Unremarkable. PSYCH:  Appropriate.  Results for orders placed or performed during the hospital encounter of 10/24/14 (from the past 48 hour(s))  CBC WITH DIFFERENTIAL     Status: Abnormal   Collection Time: 10/24/14  5:21 PM  Result Value Ref Range   WBC 7.3 3.8 - 10.6 K/uL   RBC 4.43 4.40 - 5.90 MIL/uL   Hemoglobin 12.4 (L) 13.0 - 18.0 g/dL   HCT 37.6 (L) 40.0 - 52.0 %   MCV 84.8 80.0 - 100.0 fL   MCH 28.0 26.0 - 34.0 pg   MCHC 33.1 32.0 - 36.0 g/dL   RDW 20.3 (H) 11.5 - 14.5 %   Platelets 92 (L) 150 - 440 K/uL   Neutrophils Relative % 17 %   Lymphocytes Relative 27 %   Monocytes Relative 19 %   Eosinophils Relative 3 %   Basophils Relative 0 %   Band Neutrophils 17 %   Metamyelocytes Relative 10 %   Myelocytes 5 %   Promyelocytes Absolute 2 %   Neutro Abs 3.7 1.4 - 6.5 K/uL   Lymphs Abs 2.0 1.0 - 3.6 K/uL   Monocytes Absolute 1.4 (  H) 0.2 - 1.0 K/uL   Eosinophils Absolute 0.2 0 - 0.7 K/uL   Basophils Absolute 0.0 0 - 0.1 K/uL   Smear Review      PLATELET CLUMPS NOTED ON SMEAR, COUNT APPEARS ADEQUATE  Comprehensive metabolic panel     Status: Abnormal   Collection Time: 10/24/14  5:21 PM  Result Value Ref Range   Sodium 134 (L) 135 - 145 mmol/L   Potassium 3.0 (L) 3.5 - 5.1 mmol/L   Chloride 95 (L) 101 - 111 mmol/L   CO2 27 22 - 32 mmol/L   Glucose, Bld 100 (H) 65 - 99 mg/dL   BUN 15 6 - 20 mg/dL   Creatinine, Ser 1.16 0.61 - 1.24 mg/dL   Calcium 9.0 8.9 - 10.3 mg/dL    Total Protein 7.4 6.5 - 8.1 g/dL   Albumin 3.5 3.5 - 5.0 g/dL   AST 19 15 - 41 U/L   ALT 9 (L) 17 - 63 U/L   Alkaline Phosphatase 79 38 - 126 U/L   Total Bilirubin 2.1 (H) 0.3 - 1.2 mg/dL   GFR calc non Af Amer >60 >60 mL/min   GFR calc Af Amer >60 >60 mL/min    Comment: (NOTE) The eGFR has been calculated using the CKD EPI equation. This calculation has not been validated in all clinical situations. eGFR's persistently <60 mL/min signify possible Chronic Kidney Disease.    Anion gap 12 5 - 15  Protime-INR     Status: None   Collection Time: 10/24/14  5:21 PM  Result Value Ref Range   Prothrombin Time 15.0 11.4 - 15.0 seconds   INR 1.16   Uric acid     Status: None   Collection Time: 10/24/14  5:21 PM  Result Value Ref Range   Uric Acid, Serum 6.7 4.4 - 7.6 mg/dL  Pathologist smear review     Status: None   Collection Time: 10/24/14  5:21 PM  Result Value Ref Range   Path Review       ADEQUATE FOR EVALUATION. ABNORMAL CELLS PRESENT IN PATIENT WITH PREVIOUS INVOLVEMENT OF BLOOD BY MANTLE CELL LYMPHOMA. FURTHER EVALUATION WITH FLOW CYTOMETRIC STUDIES IS RECOMMENDED.     Comment: Reviewed by Dellia Nims Rubinas, M.D.  CBC with Differential     Status: Abnormal   Collection Time: 10/25/14  5:49 AM  Result Value Ref Range   WBC 7.4 3.8 - 10.6 K/uL   RBC 3.87 (L) 4.40 - 5.90 MIL/uL   Hemoglobin 11.4 (L) 13.0 - 18.0 g/dL   HCT 32.6 (L) 40.0 - 52.0 %   MCV 84.2 80.0 - 100.0 fL   MCH 29.3 26.0 - 34.0 pg   MCHC 34.8 32.0 - 36.0 g/dL   RDW 20.2 (H) 11.5 - 14.5 %   Platelets 81 (L) 150 - 440 K/uL   Neutrophils Relative % 22 (L) 43 - 77 %   Lymphocytes Relative 20 12 - 46 %   Monocytes Relative 19 (H) 3 - 12 %   Eosinophils Relative 3 0 - 5 %   Basophils Relative 0 0 - 1 %   Band Neutrophils 15 (H) 0 - 10 %   Metamyelocytes Relative 16 %   Myelocytes 5 %   Promyelocytes Absolute 0 %   Blasts 0 %   nRBC 0 0 /100 WBC   Other 0 %   Neutro Abs 4.3 1.7 - 7.7 K/uL   Lymphs Abs 1.5 0.7  - 4.0 K/uL   Monocytes Absolute  1.4 (H) 0.1 - 1.0 K/uL   Eosinophils Absolute 0.2 0.0 - 0.7 K/uL   Basophils Absolute 0.0 0.0 - 0.1 K/uL   RBC Morphology MIXED RBC POPULATION    Smear Review LARGE PLATELETS PRESENT     Comment: PENDING PATHOLOGIST REVIEW PLATELETS APPEAR DECREASED   Basic metabolic panel     Status: Abnormal   Collection Time: 10/25/14  5:49 AM  Result Value Ref Range   Sodium 134 (L) 135 - 145 mmol/L   Potassium 2.6 (LL) 3.5 - 5.1 mmol/L    Comment: CRITICAL RESULT CALLED TO, READ BACK BY AND VERIFIED WITH MEGAN OAKLEY @0637  10/25/14 BY AJO    Chloride 99 (L) 101 - 111 mmol/L   CO2 24 22 - 32 mmol/L   Glucose, Bld 121 (H) 65 - 99 mg/dL   BUN 15 6 - 20 mg/dL   Creatinine, Ser 1.07 0.61 - 1.24 mg/dL   Calcium 7.8 (L) 8.9 - 10.3 mg/dL   GFR calc non Af Amer >60 >60 mL/min   GFR calc Af Amer >60 >60 mL/min    Comment: (NOTE) The eGFR has been calculated using the CKD EPI equation. This calculation has not been validated in all clinical situations. eGFR's persistently <60 mL/min signify possible Chronic Kidney Disease.    Anion gap 11 5 - 15  Magnesium     Status: None   Collection Time: 10/25/14  5:49 AM  Result Value Ref Range   Magnesium 1.9 1.7 - 2.4 mg/dL  Potassium     Status: Abnormal   Collection Time: 10/25/14  8:14 PM  Result Value Ref Range   Potassium 2.7 (LL) 3.5 - 5.1 mmol/L  Basic metabolic panel     Status: Abnormal   Collection Time: 10/26/14  6:06 AM  Result Value Ref Range   Sodium 136 135 - 145 mmol/L   Potassium 3.0 (L) 3.5 - 5.1 mmol/L   Chloride 102 101 - 111 mmol/L   CO2 26 22 - 32 mmol/L   Glucose, Bld 107 (H) 65 - 99 mg/dL   BUN 10 6 - 20 mg/dL   Creatinine, Ser 0.88 0.61 - 1.24 mg/dL   Calcium 8.0 (L) 8.9 - 10.3 mg/dL   GFR calc non Af Amer >60 >60 mL/min   GFR calc Af Amer >60 >60 mL/min    Comment: (NOTE) The eGFR has been calculated using the CKD EPI equation. This calculation has not been validated in all clinical  situations. eGFR's persistently <60 mL/min signify possible Chronic Kidney Disease.    Anion gap 8 5 - 15  Potassium     Status: Abnormal   Collection Time: 10/26/14  3:07 PM  Result Value Ref Range   Potassium 3.1 (L) 3.5 - 5.1 mmol/L   Dg Abd 2 Views  10/25/2014   CLINICAL DATA:  Ureteral stent  EXAM: ABDOMEN - 2 VIEW  COMPARISON:  CT 10/23/2014  FINDINGS: Double-J left ureteral stent extends from the left renal pelvis to the bladder in expected location. Oral contrast from recent CT noted within the colon. Mild bibasilar atelectasis.  IMPRESSION: Left ureteral stent in expected location.   Electronically Signed   By: Suzy Bouchard M.D.   On: 10/25/2014 13:42    Assessment:  Caleb Francis is a 69 y.o. male with progressive mantle cell lymphoma admitted with intractable left sided abdominal pain. Imaging studies reveal left sided hydronephrosis and hydroureter.  Patient post operative day #2 with marked improvement in pain.  Plan: 1. Hematology/Oncology-  Counts adequate, but suspect involvement of bone marrow with mantle cell lymphoma.  Anticipate outpatient chemotherapy ([facility]) next week. Start allopurinol prior to discharge to prevent tumor lysis issues. 2. Fluids/electrolytes/nutrition-  Potassium low.  Potassium supplimented this morning.  Repeat metabolic panel in the morning.  3. Pain- IV morphine discontinued. Switched to PO Percocet every 4 hours as needed. 4. Disposition-  Anticipate discharge tomorrow with follow-up in clinic early next week for chemo. 5.   Lloyd Huger, MD  10/26/2014, 5:20 PM

## 2014-10-26 NOTE — Plan of Care (Signed)
Problem: Discharge Progression Outcomes Goal: Other Discharge Outcomes/Goals Outcome: Progressing Plan of care progress:  Pain: -PRN pain meds given per orders with relief, now on PO pain meds -voiding without difficulty -afebrile -monitor labs

## 2014-10-27 ENCOUNTER — Encounter: Payer: Self-pay | Admitting: Urology

## 2014-10-27 LAB — CBC WITH DIFFERENTIAL/PLATELET
Basophils Absolute: 0.1 10*3/uL (ref 0–0.1)
Basophils Relative: 1 %
EOS ABS: 0.2 10*3/uL (ref 0–0.7)
HCT: 31.2 % — ABNORMAL LOW (ref 40.0–52.0)
Hemoglobin: 10.5 g/dL — ABNORMAL LOW (ref 13.0–18.0)
Lymphocytes Relative: 15 %
Lymphs Abs: 1.1 10*3/uL (ref 1.0–3.6)
MCH: 28.2 pg (ref 26.0–34.0)
MCHC: 33.7 g/dL (ref 32.0–36.0)
MCV: 83.8 fL (ref 80.0–100.0)
MONO ABS: 1.9 10*3/uL — AB (ref 0.2–1.0)
Monocytes Relative: 26 %
Neutro Abs: 4 10*3/uL (ref 1.4–6.5)
Neutrophils Relative %: 55 %
PLATELETS: 86 10*3/uL — AB (ref 150–440)
RBC: 3.72 MIL/uL — ABNORMAL LOW (ref 4.40–5.90)
RDW: 19.9 % — ABNORMAL HIGH (ref 11.5–14.5)
WBC: 7.2 10*3/uL (ref 3.8–10.6)

## 2014-10-27 LAB — MAGNESIUM: MAGNESIUM: 1.8 mg/dL (ref 1.7–2.4)

## 2014-10-27 LAB — NA AND K (SODIUM & POTASSIUM), RAND UR
POTASSIUM UR: 21 mmol/L
SODIUM UR: 40 mmol/L

## 2014-10-27 LAB — CHLORIDE, URINE, RANDOM: Chloride Urine: 70 mmol/L

## 2014-10-27 LAB — CREATININE, URINE, RANDOM: Creatinine, Urine: 50 mg/dL

## 2014-10-27 LAB — GLUCOSE, CAPILLARY: Glucose-Capillary: 123 mg/dL — ABNORMAL HIGH (ref 65–99)

## 2014-10-27 LAB — POTASSIUM: Potassium: 3 mmol/L — ABNORMAL LOW (ref 3.5–5.1)

## 2014-10-27 MED ORDER — POTASSIUM CHLORIDE 20 MEQ PO PACK
40.0000 meq | PACK | Freq: Two times a day (BID) | ORAL | Status: DC
  Administered 2014-10-27: 16:00:00 40 meq via ORAL
  Filled 2014-10-27: qty 2

## 2014-10-27 MED ORDER — DOCUSATE SODIUM 100 MG PO CAPS: 100.0000 mg | ORAL_CAPSULE | Freq: Two times a day (BID) | ORAL | Status: DC

## 2014-10-27 MED ORDER — POTASSIUM CHLORIDE 20 MEQ/15ML (10%) PO SOLN
40.0000 meq | Freq: Two times a day (BID) | ORAL | Status: DC
  Administered 2014-10-27 – 2014-10-29 (×4): 40 meq via ORAL
  Filled 2014-10-27 (×6): qty 30

## 2014-10-27 MED ORDER — KLOR-CON M10 10 MEQ PO TBCR: 20.0000 meq | EXTENDED_RELEASE_TABLET | Freq: Two times a day (BID) | ORAL | Status: DC

## 2014-10-27 MED ORDER — MAGNESIUM OXIDE 400 (241.3 MG) MG PO TABS
400.0000 mg | ORAL_TABLET | Freq: Two times a day (BID) | ORAL | Status: AC
  Administered 2014-10-27 – 2014-11-01 (×10): 400 mg via ORAL
  Filled 2014-10-27 (×10): qty 1

## 2014-10-27 MED ORDER — POTASSIUM CHLORIDE 10 MEQ/100ML IV SOLN
10.0000 meq | Freq: Once | INTRAVENOUS | Status: AC
  Administered 2014-10-27: 09:00:00 10 meq via INTRAVENOUS
  Filled 2014-10-27: qty 100

## 2014-10-27 MED ORDER — SPIRONOLACTONE 25 MG PO TABS
25.0000 mg | ORAL_TABLET | Freq: Every day | ORAL | Status: DC
  Administered 2014-10-27 – 2014-11-01 (×6): 25 mg via ORAL
  Filled 2014-10-27 (×6): qty 1

## 2014-10-27 MED ORDER — POTASSIUM CHLORIDE 10 MEQ/100ML IV SOLN
10.0000 meq | INTRAVENOUS | Status: AC
  Administered 2014-10-27 (×4): 10 meq via INTRAVENOUS
  Filled 2014-10-27 (×4): qty 100

## 2014-10-27 MED ORDER — BISACODYL 10 MG RE SUPP
10.0000 mg | Freq: Every day | RECTAL | Status: DC
  Administered 2014-10-27 – 2014-10-28 (×2): 10 mg via RECTAL
  Filled 2014-10-27 (×4): qty 1

## 2014-10-27 MED ORDER — OXYCODONE-ACETAMINOPHEN 5-325 MG PO TABS: 1.0000 | ORAL_TABLET | ORAL | Status: DC | PRN

## 2014-10-27 NOTE — Plan of Care (Signed)
Problem: Discharge Progression Outcomes Goal: Other Discharge Outcomes/Goals Outcome: Progressing Pt tol  Small amt of diet today. k cont to be low. Iv and po k given .  ivdfs cont with k.   Calls for assist to br.

## 2014-10-27 NOTE — Progress Notes (Signed)
Patient ID: Caleb Francis, male   DOB: 07-26-1945, 69 y.o.   MRN: [id number]  [place] [facility] [place] NAME: Caleb Francis    MR#:  [id number]  DATE OF BIRTH:  Nov 01, 1945  SUBJECTIVE: Feels good. Still has some left-sided abdominal discomfort as well as left sided lower extremity swelling. He has less pain, stone CHIEF COMPLAINT:  Follow up for hypokalemia, c/o pain in L abdomen, good urinary output ROS  VITAL SIGNS: Blood pressure 141/87, pulse 107, temperature 99.6 F (37.6 C), temperature source Oral, resp. rate 18, height 5\' 9"  (1.753 m), weight 74.844 kg (165 lb), SpO2 99 %.  PHYSICAL EXAMINATION:   GENERAL:  69 y.o.-year-old patient lying in the bed with no acute distress.  EYES: Pupils equal, round, reactive to light and accommodation. No scleral icterus. Extraocular muscles intact.  HEENT: Head atraumatic, normocephalic. Oropharynx and nasopharynx clear.  NECK:  Supple, no jugular venous distention. No thyroid enlargement, no tenderness.  LUNGS: Normal breath sounds bilaterally, no wheezing, rales,rhonchi or crepitation. No use of accessory muscles of respiration.  CARDIOVASCULAR: S1, S2 normal. No murmurs, rubs, or gallops.  ABDOMEN: Soft, L sided tenderness, , nondistended. Bowel sounds present. No organomegaly or mass.  EXTREMITIES: Significant lower extremity, especially thigh edema on the left, no cyanosis, or clubbing.  NEUROLOGIC: Cranial nerves II through XII are intact. Muscle strength 5/5 in all extremities. Sensation intact. Gait not checked.  PSYCHIATRIC: The patient is alert and oriented x 3.  SKIN: No obvious rash, lesion, or ulcer.   ORDERS/RESULTS REVIEWED:   CBC  Recent Labs Lab 10/22/14 2130 10/24/14 1721 10/25/14 0549 10/27/14 0541  WBC 8.0 7.3 7.4 7.2  HGB 12.4* 12.4* 11.4* 10.5*  HCT 36.9* 37.6* 32.6* 31.2*  PLT 94* 92* 81* 86*  MCV 84.4 84.8 84.2 83.8  MCH 28.3 28.0 29.3 28.2  MCHC 33.5  33.1 34.8 33.7  RDW 20.6* 20.3* 20.2* 19.9*  LYMPHSABS 1.4 2.0 1.5 1.1  MONOABS 1.1* 1.4* 1.4* 1.9*  EOSABS 0.0 0.2 0.2 0.2  BASOSABS 0.0 0.0 0.0 0.1   ------------------------------------------------------------------------------------------------------------------  Chemistries   Recent Labs Lab 10/22/14 2130 10/24/14 1721 10/25/14 0549 10/25/14 2014 10/26/14 0606 10/26/14 1507 10/27/14 0541  NA 136 134* 134*  --  136  --  135  K 3.0* 3.0* 2.6* 2.7* 3.0* 3.1* 2.9*  CL 101 95* 99*  --  102  --  99*  CO2 26 27 24   --  26  --  29  GLUCOSE 112* 100* 121*  --  107*  --  92  BUN 9 15 15   --  10  --  8  CREATININE 0.95 1.16 1.07  --  0.88  --  0.77  CALCIUM 8.9 9.0 7.8*  --  8.0*  --  8.1*  MG  --   --  1.9  --   --   --   --   AST 15 19  --   --   --   --   --   ALT 8* 9*  --   --   --   --   --   ALKPHOS 63 79  --   --   --   --   --   BILITOT 1.0 2.1*  --   --   --   --   --    ------------------------------------------------------------------------------------------------------------------ estimated creatinine clearance is 90.8 mL/min (by C-G formula based on Cr of 0.77). ------------------------------------------------------------------------------------------------------------------ No results  for input(s): TSH, T4TOTAL, T3FREE, THYROIDAB in the last 72 hours.  Invalid input(s): FREET3  Cardiac Enzymes No results for input(s): CKMB, TROPONINI, MYOGLOBIN in the last 168 hours.  Invalid input(s): CK ------------------------------------------------------------------------------------------------------------------ Invalid input(s): POCBNP ---------------------------------------------------------------------------------------------------------------  RADIOLOGY: Dg Abd 2 Views  10/25/2014   CLINICAL DATA:  Ureteral stent  EXAM: ABDOMEN - 2 VIEW  COMPARISON:  CT 10/23/2014  FINDINGS: Double-J left ureteral stent extends from the left renal pelvis to the bladder in expected  location. Oral contrast from recent CT noted within the colon. Mild bibasilar atelectasis.  IMPRESSION: Left ureteral stent in expected location.   Electronically Signed   By: Suzy Bouchard M.D.   On: 10/25/2014 13:42    EKG: No orders found for this or any previous visit.  ASSESSMENT AND PLAN:  1. Hypokalemia, unclear etiology. We will be getting nephrologist consultation to rule out renal losses, continue  oral as well as IV supplements. Replenishing  magnesium level and repeat his potassium level later today. Not much improvement today  2. Some nausea and mild abdominal distention ; constipated, continue colace, senna, add Dulcolax , follow  PO intake, patient claims has been eating better although patient's oral intake was only 240 cc the past shift.  3. Hypertension blood pressure appears to be stable continue Norvasc as taking at home.  4. Mental cell lymphoma: Being followed by oncology  5 . Left lower extremity swelling, likely due to lymphoma and impaired lymph return, no DVT noted on Doppler ultrasound earlier on admission.   Management plans discussed with the patient, family and they are in agreement.   DRUG ALLERGIES: No Known Allergies  CODE STATUS:     Code Status Orders        Start     Ordered   10/24/14 1710  Full code   Continuous     10/24/14 1709      TOTAL TIME TAKING CARE OF THIS PATIENT: 40  minutes.    Theodoro Grist M.D on 10/27/2014 at 12:49 PM  Between 7am to 6pm - Pager - [phone]  After 6pm go to [email or web address] - password [email or web address] [facility]  [place] [facility]  Office  [phone]  CC: Primary care physician; No PCP Per Patient

## 2014-10-27 NOTE — Consult Note (Signed)
Reason for Consult:hypokalemia Referring Physician: Dr Maureen Ralphs is an 69 y.o. male. HPI:  Caleb Francis is an 69 y.o. 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 He has received chemotherapy with Rituxan, Bendamustine, RCHOP, Ibrutinib Nephrology consult is requested for Hypokalemia A Left ureteral stent was placed by Dr Jeffie Pollock on May 12 Patient has chronic hypokalemia in 2016 Prior to that K was in normal range  At present he reports poor appetite  Past Medical History  Diagnosis Date  . Arthritis   . Hypertension   . RA (rheumatoid arthritis)   . Anemia   . Cancer     lymphoma    Past Surgical History  Procedure Laterality Date  . Back surgery    . Fracture surgery      ankle  . Portacath placement      Family History  Problem Relation Age of Onset  . Cancer Mother     breast  . Cancer Father     bone cancer    Social History:  reports that he quit smoking 9 days ago. His smoking use included Cigarettes. He has a 30 pack-year smoking history. He does not have any smokeless tobacco history on file. He reports that he does not drink alcohol or use illicit drugs.  Allergies: No Known Allergies  Medications:  Scheduled: . amLODipine  5 mg Oral Daily  . bisacodyl  10 mg Rectal Daily  . docusate sodium  100 mg Oral BID  . potassium chloride  40 mEq Oral BID  . senna  1 tablet Oral Daily  . spironolactone  25 mg Oral Daily    Results for orders placed or performed during the hospital encounter of 10/24/14 (from the past 48 hour(s))  Potassium     Status: Abnormal   Collection Time: 10/25/14  8:14 PM  Result Value Ref Range   Potassium 2.7 (LL) 3.5 - 5.1 mmol/L  Basic metabolic panel     Status: Abnormal   Collection Time: 10/26/14  6:06 AM  Result Value Ref Range   Sodium 136 135 - 145 mmol/L   Potassium  3.0 (L) 3.5 - 5.1 mmol/L   Chloride 102 101 - 111 mmol/L   CO2 26 22 - 32 mmol/L   Glucose, Bld 107 (H) 65 - 99 mg/dL   BUN 10 6 - 20 mg/dL   Creatinine, Ser 0.88 0.61 - 1.24 mg/dL   Calcium 8.0 (L) 8.9 - 10.3 mg/dL   GFR calc non Af Amer >60 >60 mL/min   GFR calc Af Amer >60 >60 mL/min    Comment: (NOTE) The eGFR has been calculated using the CKD EPI equation. This calculation has not been validated in all clinical situations. eGFR's persistently <60 mL/min signify possible Chronic Kidney Disease.    Anion gap 8 5 - 15  Potassium     Status: Abnormal   Collection Time: 10/26/14  3:07 PM  Result Value Ref Range   Potassium 3.1 (L) 3.5 - 5.1 mmol/L  CBC with Differential     Status: Abnormal   Collection Time: 10/27/14  5:41 AM  Result Value Ref Range   WBC 7.2 3.8 - 10.6 K/uL   RBC 3.72 (L) 4.40 - 5.90 MIL/uL   Hemoglobin 10.5 (L) 13.0 - 18.0 g/dL   HCT 31.2 (L) 40.0 - 52.0 %   MCV 83.8 80.0 -  100.0 fL   MCH 28.2 26.0 - 34.0 pg   MCHC 33.7 32.0 - 36.0 g/dL   RDW 19.9 (H) 11.5 - 14.5 %   Platelets 86 (L) 150 - 440 K/uL   Neutrophils Relative % 55% %   Neutro Abs 4.0 1.4 - 6.5 K/uL   Lymphocytes Relative 15% %   Lymphs Abs 1.1 1.0 - 3.6 K/uL   Monocytes Relative 26% %   Monocytes Absolute 1.9 (H) 0.2 - 1.0 K/uL   Eosinophils Relative 3% %   Eosinophils Absolute 0.2 0 - 0.7 K/uL   Basophils Relative 1% %   Basophils Absolute 0.1 0 - 0.1 K/uL  Basic metabolic panel     Status: Abnormal   Collection Time: 10/27/14  5:41 AM  Result Value Ref Range   Sodium 135 135 - 145 mmol/L   Potassium 2.9 (LL) 3.5 - 5.1 mmol/L   Chloride 99 (L) 101 - 111 mmol/L   CO2 29 22 - 32 mmol/L   Glucose, Bld 92 65 - 99 mg/dL   BUN 8 6 - 20 mg/dL   Creatinine, Ser 0.77 0.61 - 1.24 mg/dL   Calcium 8.1 (L) 8.9 - 10.3 mg/dL   GFR calc non Af Amer >60 >60 mL/min   GFR calc Af Amer >60 >60 mL/min    Comment: (NOTE) The eGFR has been calculated using the CKD EPI equation. This calculation has  not been validated in all clinical situations. eGFR's persistently <60 mL/min signify possible Chronic Kidney Disease.    Anion gap 7 5 - 15    Dg Abd 2 Views  10/25/2014   CLINICAL DATA:  Ureteral stent  EXAM: ABDOMEN - 2 VIEW  COMPARISON:  CT 10/23/2014  FINDINGS: Double-J left ureteral stent extends from the left renal pelvis to the bladder in expected location. Oral contrast from recent CT noted within the colon. Mild bibasilar atelectasis.  IMPRESSION: Left ureteral stent in expected location.   Electronically Signed   By: Suzy Bouchard M.D.   On: 10/25/2014 13:42    Review of Systems  Constitutional: Positive for malaise/fatigue. Negative for fever and chills.  HENT: Negative for congestion and hearing loss.   Eyes: Negative for blurred vision and double vision.  Respiratory: Negative for cough, hemoptysis and sputum production.   Cardiovascular: Negative for chest pain, palpitations and leg swelling.  Gastrointestinal: Negative for heartburn, nausea, vomiting, abdominal pain and diarrhea.  Genitourinary: Positive for flank pain. Negative for dysuria, urgency, frequency and hematuria.  Musculoskeletal: Negative for myalgias, back pain and neck pain.  Skin: Negative for itching and rash.  Neurological: Positive for weakness. Negative for dizziness, tingling, tremors and headaches.  Endo/Heme/Allergies: Does not bruise/bleed easily.  Psychiatric/Behavioral: Negative for depression and substance abuse.   Blood pressure 141/87, pulse 107, temperature 99.6 F (37.6 C), temperature source Oral, resp. rate 18, height 5' 9"  (1.753 m), weight 74.844 kg (165 lb), SpO2 99 %. Physical Exam  Constitutional: He is oriented to person, place, and time.  Thin built  HENT:  Head: Normocephalic.  Eyes: Pupils are equal, round, and reactive to light. No scleral icterus.  Neck: Normal range of motion.  Cardiovascular: Normal rate and regular rhythm.  Exam reveals friction rub.   No murmur  heard. Respiratory: Effort normal and breath sounds normal. No respiratory distress.  GI: Soft. Bowel sounds are normal. He exhibits no distension. There is no tenderness.  Musculoskeletal: He exhibits no edema or tenderness.  Neurological: He is alert and oriented to person,  place, and time.  Skin: Skin is warm and dry. No rash noted. No erythema.  Psychiatric: He has a normal mood and affect. His behavior is normal.    Assessment/Plan: 60 AAM with progressive mantle cell lymphoma and intractable left sided abdominal pain, left sided hydronephrosis and hydroureter. S/p ureteral stent placement   1. Hypokalemia - Likely from poor po intake and ongoing renal losses Possibility of tubular injury from chemotherapy UOP > 2875 cc + unmeasured voids Will order urine lytes Continue aggressive iv and Po potassium and magnesium replacement Start spironolactone   Caleb Francis 10/27/2014, 10:34 AM

## 2014-10-27 NOTE — Plan of Care (Signed)
Problem: Discharge Progression Outcomes Goal: Discharge plan in place and appropriate Individualization  Outcome: Progressing Pt prefers to be called Caleb Francis. Hx of arthritis and HTN controlled by home medications Pt is accepting of condition and active in care. Goal: Other Discharge Outcomes/Goals Plan of care progress to goals: Pain: lower abdomen pain relieved by PRN percocet  Hemodynamically stable: VSS, afebrile, continue to monitor K Complications: none evident  Activity: pt is independent with ADLs No fall/injury this shift

## 2014-10-27 NOTE — Progress Notes (Signed)
Reported potassium level 2.9 to Dr Ether Griffins

## 2014-10-27 NOTE — Evaluation (Signed)
Physical Therapy Evaluation Patient Details Name: Caleb Francis MRN: [id number] DOB: September 25, 1945 Today's Date: 10/27/2014   History of Present Illness  Pt had a stent palced in L LE 5/12, continues to have some L LE pain.  Pt to start chemo therapy in the near future.  Clinical Impression  Pt is a 69 y/o male here with L LE soreness and is able to do some limited mobility and ambulation but is not near his baseline and does not feel comfortable going home and taking care of himself.  He is normally independent and able to take care of all his errands, etc.  Pt hoping to be able to go home, but STR is recommended at this time.     Follow Up Recommendations SNF    Equipment Recommendations       Recommendations for Other Services       Precautions / Restrictions Precautions Precautions: Fall (moderate) Restrictions Weight Bearing Restrictions: No      Mobility  Bed Mobility Overal bed mobility: Modified Independent             General bed mobility comments:  (bed rail and vcs )  Transfers Overall transfer level: Modified independent Equipment used: Straight cane (pt's from home, it is too tall for him)                Ambulation/Gait Ambulation/Gait assistance: Supervision Ambulation Distance (Feet): 75 Feet Assistive device: Straight cane     Gait velocity interpretation: <1.8 ft/sec, indicative of risk for recurrent falls General Gait Details: Pt is slow and hesitant, limping on L LE.  He reports being much slower than baseline but is eager to get   Stairs            Wheelchair Mobility    Modified Rankin (Stroke Patients Only)       Balance Overall balance assessment: Modified Independent                                           Pertinent Vitals/Pain Pain Assessment: 0-10 Pain Score: 8  Pain Location:  (R hip/groin)    Home Living Family/patient expects to be discharged to::  (pt wants to go home, unsure if he  will be able to) Living Arrangements: Alone                    Prior Function Level of Independence: Independent with assistive device(s)         Comments: cane     Hand Dominance        Extremity/Trunk Assessment   Upper Extremity Assessment: Overall WFL for tasks assessed           Lower Extremity Assessment:  (L LE grossly 4/5, R LE pain limited and grossly 3/5)         Communication   Communication: No difficulties  Cognition Arousal/Alertness: Awake/alert Behavior During Therapy: WFL for tasks assessed/performed Overall Cognitive Status: Within Functional Limits for tasks assessed                      General Comments      Exercises        Assessment/Plan    PT Assessment Patient needs continued PT services  PT Diagnosis Difficulty walking;Generalized weakness;Acute pain   PT Problem List Decreased strength;Decreased activity tolerance;Decreased balance;Decreased mobility;Pain  PT Treatment Interventions Gait  training;Stair training;Therapeutic activities;Therapeutic exercise;Balance training;Neuromuscular re-education;Functional mobility training   PT Goals (Current goals can be found in the Care Plan section) Acute Rehab PT Goals Patient Stated Goal: "I want to go back home" PT Goal Formulation: With patient Time For Goal Achievement: 11/10/14 Potential to Achieve Goals: Good    Frequency Min 2X/week   Barriers to discharge        Co-evaluation               End of Session Equipment Utilized During Treatment: Gait belt   Patient left: in bed           Time: 0932-3557 PT Time Calculation (min) (ACUTE ONLY): 15 min   Charges:   PT Evaluation $Initial PT Evaluation Tier I: 1 Procedure     PT G Codes:       Wayne Both, PT, DPT [phone]  Kreg Shropshire 10/27/2014, 11:57 AM

## 2014-10-27 NOTE — Progress Notes (Signed)
Mr Lazo states that he sees a physician several times a year at the [facility] in [place]. Mr Broyles cannot recall the name of the physician.

## 2014-10-27 NOTE — Progress Notes (Signed)
Discussed home health providers with Caleb Francis who had no choice and finally chose [facility] as his home health provider. A referral to [facility] for PT was faxed and called to [facility]. Caleb Francis was advised that [facility] would call him at home to schedule an appointment either Monday or Tuesday.

## 2014-10-28 LAB — BASIC METABOLIC PANEL
Anion gap: 7 (ref 5–15)
Anion gap: 9 (ref 5–15)
BUN: 8 mg/dL (ref 6–20)
BUN: 7 mg/dL (ref 6–20)
CALCIUM: 8.3 mg/dL — AB (ref 8.9–10.3)
CO2: 29 mmol/L (ref 22–32)
CO2: 28 mmol/L (ref 22–32)
CREATININE: 0.77 mg/dL (ref 0.61–1.24)
CREATININE: 0.74 mg/dL (ref 0.61–1.24)
Calcium: 8.1 mg/dL — ABNORMAL LOW (ref 8.9–10.3)
Chloride: 99 mmol/L — ABNORMAL LOW (ref 101–111)
Chloride: 97 mmol/L — ABNORMAL LOW (ref 101–111)
GFR calc Af Amer: >60 mL/min (ref >60)
GLUCOSE: 89 mg/dL (ref 65–99)
Glucose, Bld: 92 mg/dL (ref 65–99)
Potassium: 2.9 mmol/L — CL (ref 3.5–5.1)
Potassium: 3.6 mmol/L (ref 3.5–5.1)
SODIUM: 135 mmol/L (ref 135–145)
Sodium: 134 mmol/L — ABNORMAL LOW (ref 135–145)

## 2014-10-28 LAB — POTASSIUM: POTASSIUM: 2.7 mmol/L — AB (ref 3.5–5.1)

## 2014-10-28 LAB — TSH: TSH: 1.81 u[IU]/mL (ref 0.350–4.500)

## 2014-10-28 MED ORDER — ALLOPURINOL 100 MG PO TABS
300.0000 mg | ORAL_TABLET | Freq: Every day | ORAL | Status: DC
  Administered 2014-10-28 – 2014-11-01 (×5): 300 mg via ORAL
  Filled 2014-10-28 (×5): qty 3

## 2014-10-28 MED ORDER — POTASSIUM CHLORIDE IN NACL 40-0.9 MEQ/L-% IV SOLN
INTRAVENOUS | Status: DC
  Administered 2014-10-28 – 2014-10-29 (×2): 75 mL/h via INTRAVENOUS
  Filled 2014-10-28 (×4): qty 1000

## 2014-10-28 MED ORDER — ENSURE ENLIVE PO LIQD
237.0000 mL | Freq: Three times a day (TID) | ORAL | Status: DC
  Administered 2014-10-28 – 2014-11-01 (×10): 237 mL via ORAL

## 2014-10-28 MED ORDER — SODIUM CHLORIDE 0.9 % IV SOLN
25.0000 mg | Freq: Once | INTRAVENOUS | Status: AC
  Administered 2014-10-28: 21:00:00 25 mg via INTRAVENOUS
  Filled 2014-10-28: qty 1

## 2014-10-28 MED ORDER — ENSURE ENLIVE PO LIQD: 237.0000 mL | Freq: Three times a day (TID) | ORAL | Status: DC

## 2014-10-28 NOTE — Clinical Social Work Note (Signed)
Clinical Social Worker received consult for SNF. CSW briefly met with the pt to address consult. Pt shared that PT is recommending HHPT. Pt would like to return home at discharge. CSW updated RNCM. CSW is signing off as no further needs identified. Please reconsult if a need arises prior to discharge.   Caleb Francis, MSW, LCSW Clinical Social Worker  [phone]

## 2014-10-28 NOTE — Progress Notes (Signed)
Patient ID: Caleb Francis, male   DOB: [birth year], 69 y.o.   MRN: [id number]  [facility] [place] NAME: Caleb Francis    MR#:  [id number]  DATE OF BIRTH:  10/15/1945  SUBJECTIVE: Feels good. Still has some left-sided abdominal discomfort as well as left sided lower extremity swelling. He has less pain, stone CHIEF COMPLAINT:  Follow up for hypokalemia, c/o pain in L abdomen, good urinary output. Some SOB on exertion, PT will work with him today, likely [facility] at home Review of Systems  All other systems reviewed and are negative.   VITAL SIGNS: Blood pressure 134/79, pulse 104, temperature 99.1 F (37.3 C), temperature source Oral, resp. rate 18, height 5\' 9"  (1.753 m), weight 74.844 kg (165 lb), SpO2 98 %.  PHYSICAL EXAMINATION:   GENERAL:  69 y.o.-year-old patient lying in the bed with no acute distress.  EYES: Pupils equal, round, reactive to light and accommodation. No scleral icterus. Extraocular muscles intact.  HEENT: Head atraumatic, normocephalic. Oropharynx and nasopharynx clear.  NECK:  Supple, no jugular venous distention. No thyroid enlargement, no tenderness.  LUNGS: Normal breath sounds bilaterally, no wheezing, rales,rhonchi or crepitation. No use of accessory muscles of respiration.  CARDIOVASCULAR: S1, S2 normal. No murmurs, rubs, or gallops.  ABDOMEN: Soft, L sided tenderness, , nondistended. Bowel sounds present. No organomegaly or mass.  EXTREMITIES: Significant lower extremity, especially thigh edema on the left, no cyanosis, or clubbing.  NEUROLOGIC: Cranial nerves II through XII are intact. Muscle strength 5/5 in all extremities. Sensation intact. Gait not checked.  PSYCHIATRIC: The patient is alert and oriented x 3.  SKIN: No obvious rash, lesion, or ulcer.   ORDERS/RESULTS REVIEWED:   CBC  Recent Labs Lab 10/22/14 2130 10/24/14 1721 10/25/14 0549 10/27/14 0541  WBC 8.0 7.3 7.4 7.2  HGB 12.4*  12.4* 11.4* 10.5*  HCT 36.9* 37.6* 32.6* 31.2*  PLT 94* 92* 81* 86*  MCV 84.4 84.8 84.2 83.8  MCH 28.3 28.0 29.3 28.2  MCHC 33.5 33.1 34.8 33.7  RDW 20.6* 20.3* 20.2* 19.9*  LYMPHSABS 1.4 2.0 1.5 1.1  MONOABS 1.1* 1.4* 1.4* 1.9*  EOSABS 0.0 0.2 0.2 0.2  BASOSABS 0.0 0.0 0.0 0.1   ------------------------------------------------------------------------------------------------------------------  Chemistries   Recent Labs Lab 10/22/14 2130 10/24/14 1721 10/25/14 0549 10/25/14 2014 10/26/14 0606 10/26/14 1507 10/27/14 0541 10/27/14 1542  NA 136 134* 134*  --  136  --  135  --   K 3.0* 3.0* 2.6* 2.7* 3.0* 3.1* 2.9* 3.0*  CL 101 95* 99*  --  102  --  99*  --   CO2 26 27 24   --  26  --  29  --   GLUCOSE 112* 100* 121*  --  107*  --  92  --   BUN 9 15 15   --  10  --  8  --   CREATININE 0.95 1.16 1.07  --  0.88  --  0.77  --   CALCIUM 8.9 9.0 7.8*  --  8.0*  --  8.1*  --   MG  --   --  1.9  --   --   --   --  1.8  AST 15 19  --   --   --   --   --   --   ALT 8* 9*  --   --   --   --   --   --  ALKPHOS 63 79  --   --   --   --   --   --   BILITOT 1.0 2.1*  --   --   --   --   --   --    ------------------------------------------------------------------------------------------------------------------ estimated creatinine clearance is 90.8 mL/min (by C-G formula based on Cr of 0.77). ------------------------------------------------------------------------------------------------------------------  Recent Labs  10/28/14 0552  TSH 1.810    Cardiac Enzymes No results for input(s): CKMB, TROPONINI, MYOGLOBIN in the last 168 hours.  Invalid input(s): CK ------------------------------------------------------------------------------------------------------------------ Invalid input(s): POCBNP ---------------------------------------------------------------------------------------------------------------  RADIOLOGY: No results found.  EKG: No orders found for this or any  previous visit.  ASSESSMENT AND PLAN:  1. Hypokalemia, etiology is likely poor po intake and renal losses, per nephrology, now on spirolonlactone . Getting Mg, K levels today, poss dc home if normal 2. Some nausea and mild abdominal distention ; constipation resolved, continue colace, senna, , better  PO intake, add ensure, d/w pt extensively 3. Hypertension blood pressure appears to be stable continue Norvasc as taking at home.  4. Mantal cell lymphoma: Being followed by oncology  5 . Left lower extremity swelling, likely due to lymphoma and impaired lymph return, no DVT noted on Doppler ultrasound earlier on admission.   Management plans discussed with the patient, family and they are in agreement.   DRUG ALLERGIES: No Known Allergies  CODE STATUS:     Code Status Orders        Start     Ordered   10/24/14 1710  Full code   Continuous     10/24/14 1709      TOTAL TIME TAKING CARE OF THIS PATIENT: 35 minutes.    Theodoro Grist M.D on 10/28/2014 at 11:32 AM  Between 7am to 6pm - Pager - [phone]  After 6pm go to [email or web address] - password [email or web address] [facility]  [facility]  Office  [phone]  CC: Primary care physician; No PCP Per Patient

## 2014-10-28 NOTE — Plan of Care (Signed)
Problem: Discharge Progression Outcomes Goal: Other Discharge Outcomes/Goals Pt  Cont to c./o pain  Left groin abdlower area. Pain med helps.  Voiding well. Cont on po k supplement.  k 3.6 today. Started on allopurinol today.

## 2014-10-28 NOTE — Progress Notes (Signed)
Dr. Molli Hazard notified about patient c/o hiccups. IV thorazine 25 mg ordered once.

## 2014-10-28 NOTE — Progress Notes (Signed)
Physical Therapy Treatment Patient Details Name: Caleb Francis MRN: [id number] DOB: 06/02/1946 Today's Date: 10/28/2014    History of Present Illness Pt had a stent palced in L LE 5/12, continues to have some L LE pain.  Pt to start chemo therapy in the near future.    PT Comments    Pt demonstrates good progress toward goals. His ambulation distance has improved and pt denies increase in L groin pain during ambulation although gait is still antalgic. Discharge plans updated to [facility] PT and pt is in agreement.    Follow Up Recommendations  Home health PT     Equipment Recommendations  None recommended by PT    Recommendations for Other Services       Precautions / Restrictions Precautions Precautions: Fall (moderate) Restrictions Weight Bearing Restrictions: No    Mobility  Bed Mobility Overal bed mobility: Modified Independent (use of bed rail)             General bed mobility comments: bed rail and vcs   Transfers Overall transfer level: Modified independent Equipment used: Straight cane (pt's from home, it is too tall for him)             General transfer comment: Good sequencing and balance noted  Ambulation/Gait Ambulation/Gait assistance: Supervision Ambulation Distance (Feet): 300 Feet Assistive device: Straight cane Gait Pattern/deviations: Decreased step length - right;Antalgic   Gait velocity interpretation: <1.8 ft/sec, indicative of risk for recurrent falls General Gait Details: Antalgic gait with pain during LLE weight bearing but not increased at rest. Overall pt is steady and stable without evidence for balance disturbance   Stairs            Wheelchair Mobility    Modified Rankin (Stroke Patients Only)       Balance Overall balance assessment: Independent                                  Cognition Arousal/Alertness: Awake/alert Behavior During Therapy: WFL for tasks assessed/performed Overall Cognitive  Status: Within Functional Limits for tasks assessed                      Exercises General Exercises - Lower Extremity Long Arc Quad: Strengthening;Both;Seated;15 reps Heel Slides: Strengthening;Both;Seated;15 reps Hip ABduction/ADduction: Strengthening;Seated;Both;15 reps Hip Flexion/Marching: Strengthening;Seated;Both;15 reps Heel Raises: Strengthening;Seated;Both;15 reps    General Comments        Pertinent Vitals/Pain Pain Assessment: 0-10 Pain Score: 7  Pain Location: L groin (Pain does not increase during ambulation) Pain Intervention(s): Other (comment) (RN aware and present during pain questioning)    Home Living                      Prior Function            PT Goals (current goals can now be found in the care plan section) Acute Rehab PT Goals Patient Stated Goal: "I want to go back home" PT Goal Formulation: With patient Time For Goal Achievement: 11/10/14 Potential to Achieve Goals: Good Progress towards PT goals: Progressing toward goals    Frequency  Min 2X/week    PT Plan Discharge plan needs to be updated    Co-evaluation             End of Session Equipment Utilized During Treatment: Gait belt   Patient left: in bed (moderate fall risk)     Time:  1130-1200 PT Time Calculation (min) (ACUTE ONLY): 30 min  Charges:  $Therapeutic Exercise: 23-37 mins                    G Codes:      Phillips Grout, PT  Huprich,Jason 10/28/2014, 12:01 PM

## 2014-10-28 NOTE — Plan of Care (Signed)
Problem: Discharge Progression Outcomes Goal: Discharge plan in place and appropriate Individualization  Moderate fall risk Hx of arthritis and HTN controlled by home meds    Goal: Other Discharge Outcomes/Goals Plan of care progress to goal: Pain: percocet given x1 with left groin pain with improvement Hemodynamically: VSS, potassium supplement given po as well as IV Activity: up with minimal assistance

## 2014-10-28 NOTE — Care Management (Signed)
Physical therapy continues to work with Caleb Francis. Today's recommendation is home with home health and physical therapy. Discussed agencies with Caleb Francis.  States  "I don't know any of them." Will contact [facility].  Shelbie Ammons RN MSN Care Management [phone]

## 2014-10-28 NOTE — Progress Notes (Signed)
[facility] Hematology/Oncology Progress Note  Date of admission: 10/24/2014  Hospital day:  10/28/2014  Chief Complaint: Caleb Francis is a 69 y.o. male who was admitted progressive mantle cell lymphoma who was admitted with intractable pain associated with left sided hydronephrosis.  Subjective: Patient still having some discomfort in left lower quadrant/pelvic area.  Urine output good.  Urine clearing.  Pain control on oral medications.  Social History: The patient is alone today.  Allergies: No Known Allergies  Scheduled Medications: . allopurinol  300 mg Oral Daily  . amLODipine  5 mg Oral Daily  . bisacodyl  10 mg Rectal Daily  . docusate sodium  100 mg Oral BID  . magnesium oxide  400 mg Oral BID  . potassium chloride  40 mEq Oral BID  . senna  1 tablet Oral Daily  . spironolactone  25 mg Oral Daily    Review of Systems: GENERAL:  Feels better.  No fevers, sweats or weight loss. PERFORMANCE STATUS (ECOG):  2 HEENT:  No visual changes, runny nose, sore throat, mouth sores or tenderness. Lungs: No shortness of breath or cough.  No hemoptysis. Cardiac:  No chest pain, palpitations, orthopnea, or PND. GI:  No nausea, vomiting, diarrhea, constipation, melena or hematochezia. GU:  Urine color clearing.  Still with left lower quadrant discomfort.  Denies bladder spasms.  No urgency, frequency, dysuria, or hematuria. Musculoskeletal:  No back pain.  No joint pain.  No muscle tenderness. Extremities:  No pain or swelling. Skin:  No rashes or skin changes. Neuro:  No headache, numbness or weakness, balance or coordination issues. Endocrine:  No diabetes, thyroid issues, hot flashes or night sweats. Psych:  No mood changes, depression or anxiety. Pain:  Left lower quadrant discomfort. Review of systems:  All other systems reviewed and found to be negative.   Physical Exam: Blood pressure 134/79, pulse 104, temperature 99.1 F (37.3 C), temperature  source Oral, resp. rate 18, height 5' 9"  (1.753 m), weight 165 lb (74.844 kg), SpO2 98 %.  GENERAL:  Well developed, well nourished, sitting comfortably in the exam room in no acute distress. MENTAL STATUS:  Alert and oriented to person, place and time. HEAD:  Caleb Francis with male pattern baldness.  Normocephalic, atraumatic, face symmetric, no Cushingoid features. EYES:  Brown eyes.  Pupils equal round and reactive to light and accomodation.  No conjunctivitis or scleral icterus. ENT:  Oropharynx clear without lesion.  Tongue normal. Mucous membranes moist.  RESPIRATORY:  Clear to auscultation without rales, wheezes or rhonchi. CARDIOVASCULAR:  Regular rate and rhythm without murmur, rub or gallop. ABDOMEN:  Left sided abdominal fullness/mass.  Soft, non-tender, with active bowel sounds, and no hepatosplenomegaly. SKIN:  No rashes, ulcers or lesions. EXTREMITIES:Left lower extremity edema (stable).  No palpable cords.  NEUROLOGICAL: Unremarkable. PSYCH:  Appropriate.  Results for orders placed or performed during the hospital encounter of 10/24/14 (from the past 48 hour(s))  Potassium     Status: Abnormal   Collection Time: 10/26/14  3:07 PM  Result Value Ref Range   Potassium 3.1 (L) 3.5 - 5.1 mmol/L  CBC with Differential     Status: Abnormal   Collection Time: 10/27/14  5:41 AM  Result Value Ref Range   WBC 7.2 3.8 - 10.6 K/uL   RBC 3.72 (L) 4.40 - 5.90 MIL/uL   Hemoglobin 10.5 (L) 13.0 - 18.0 g/dL   HCT 31.2 (L) 40.0 - 52.0 %   MCV 83.8 80.0 - 100.0 fL  MCH 28.2 26.0 - 34.0 pg   MCHC 33.7 32.0 - 36.0 g/dL   RDW 19.9 (H) 11.5 - 14.5 %   Platelets 86 (L) 150 - 440 K/uL   Neutrophils Relative % 55% %   Neutro Abs 4.0 1.4 - 6.5 K/uL   Lymphocytes Relative 15% %   Lymphs Abs 1.1 1.0 - 3.6 K/uL   Monocytes Relative 26% %   Monocytes Absolute 1.9 (H) 0.2 - 1.0 K/uL   Eosinophils Relative 3% %   Eosinophils Absolute 0.2 0 - 0.7 K/uL   Basophils Relative 1% %   Basophils Absolute  0.1 0 - 0.1 K/uL  Basic metabolic panel     Status: Abnormal   Collection Time: 10/27/14  5:41 AM  Result Value Ref Range   Sodium 135 135 - 145 mmol/L   Potassium 2.9 (LL) 3.5 - 5.1 mmol/L   Chloride 99 (L) 101 - 111 mmol/L   CO2 29 22 - 32 mmol/L   Glucose, Bld 92 65 - 99 mg/dL   BUN 8 6 - 20 mg/dL   Creatinine, Ser 0.77 0.61 - 1.24 mg/dL   Calcium 8.1 (L) 8.9 - 10.3 mg/dL   GFR calc non Af Amer >60 >60 mL/min   GFR calc Af Amer >60 >60 mL/min    Comment: (NOTE) The eGFR has been calculated using the CKD EPI equation. This calculation has not been validated in all clinical situations. eGFR's persistently <60 mL/min signify possible Chronic Kidney Disease.    Anion gap 7 5 - 15  Potassium     Status: Abnormal   Collection Time: 10/27/14  3:42 PM  Result Value Ref Range   Potassium 3.0 (L) 3.5 - 5.1 mmol/L  Magnesium     Status: None   Collection Time: 10/27/14  3:42 PM  Result Value Ref Range   Magnesium 1.8 1.7 - 2.4 mg/dL  Creatinine, urine, random     Status: None   Collection Time: 10/27/14  4:10 PM  Result Value Ref Range   Creatinine, Urine 50 mg/dL  Chloride, urine, random     Status: None   Collection Time: 10/27/14  4:10 PM  Result Value Ref Range   Chloride Urine 70 mmol/L  Na and K (sodium & potassium), rand urine     Status: None   Collection Time: 10/27/14  4:10 PM  Result Value Ref Range   Sodium, Ur 40 mmol/L   Potassium Urine Timed 21 mmol/L  Glucose, capillary     Status: Abnormal   Collection Time: 10/27/14 10:14 PM  Result Value Ref Range   Glucose-Capillary 123 (H) 65 - 99 mg/dL  TSH     Status: None   Collection Time: 10/28/14  5:52 AM  Result Value Ref Range   TSH 1.810 0.350 - 4.500 uIU/mL   Assessment:  Caleb Francis is a 69 y.o. male with progressive mantle cell lymphoma admitted with intractable left sided abdominal pain. Imaging studies reveal left sided hydronephrosis and hydroureter. Patient post operative day #4 status post left  internal ureteral stent placement.  Patient has ongoing issues with hypokalemia.  Plan: 1.  Hematology/Oncology-  Counts stable; anticipate salvage chemotherapy (RICE) this week as an outpatient.  In anticipation of chemotherapy, begin allopurinol 300 mg a day to decrease uric acid as likely will have significanct tumor lysis. 2.  Nephrology-  Ongoing significant hypokalemia despite aggressive supplimentation.  Appreciate nephrology consult.  Etiology and necessary outpatient oral potassium support being determined. 3.  Pain/Toxicology-  Off morphine.  Currently tolerating oral pain medication regimen. 4.  Disposition- anticipate outpatient discharge tomorrow.  Lequita Asal, MD  10/28/2014, 10:25 AM

## 2014-10-28 NOTE — Progress Notes (Signed)
Subjective: Pt reports still feeling a bit weak. Still has LLE edema.   Objective: Vital signs in last 24 hours: Temp:  [98.2 F (36.8 C)-99.3 F (37.4 C)] 99.1 F (37.3 C) (05/16 0513) Pulse Rate:  [103-111] 104 (05/16 0513) Resp:  [18] 18 (05/16 0513) BP: (127-134)/(67-79) 134/79 mmHg (05/16 0513) SpO2:  [98 %-100 %] 98 % (05/16 0513) Weight change:   Intake/Output from previous day:  Intake/Output Summary (Last 24 hours) at 10/28/14 0947 Last data filed at 10/28/14 0515  Gross per 24 hour  Intake    357 ml  Output   1925 ml  Net  -1568 ml    Intake/Output this shift:    General appearance: NAD HEENT: Atraumatic; EOMI, hearing intact, OM moist Neck: Trachea midline; supple Lungs: CTAB, with normal respiratory effort  CV: S1S2, no obvious rub Abdomen: Soft, NTND, BS present Extremities: 2+ LLE edema Skin: Warm and dry, no rashes noted. Neuro/Psych: Awake, alert, following commands, normal mood  Lab Results: Results for orders placed or performed during the hospital encounter of 10/24/14 (from the past 24 hour(s))  Potassium     Status: Abnormal   Collection Time: 10/27/14  3:42 PM  Result Value Ref Range   Potassium 3.0 (L) 3.5 - 5.1 mmol/L  Magnesium     Status: None   Collection Time: 10/27/14  3:42 PM  Result Value Ref Range   Magnesium 1.8 1.7 - 2.4 mg/dL  Creatinine, urine, random     Status: None   Collection Time: 10/27/14  4:10 PM  Result Value Ref Range   Creatinine, Urine 50 mg/dL  Chloride, urine, random     Status: None   Collection Time: 10/27/14  4:10 PM  Result Value Ref Range   Chloride Urine 70 mmol/L  Na and K (sodium & potassium), rand urine     Status: None   Collection Time: 10/27/14  4:10 PM  Result Value Ref Range   Sodium, Ur 40 mmol/L   Potassium Urine Timed 21 mmol/L  Glucose, capillary     Status: Abnormal   Collection Time: 10/27/14 10:14 PM  Result Value Ref Range   Glucose-Capillary 123 (H) 65 - 99 mg/dL  TSH      Status: None   Collection Time: 10/28/14  5:52 AM  Result Value Ref Range   TSH 1.810 0.350 - 4.500 uIU/mL    Studies/Results: No results found.  [name] amLODipine  5 mg Oral Daily  . bisacodyl  10 mg Rectal Daily  . docusate sodium  100 mg Oral BID  . magnesium oxide  400 mg Oral BID  . potassium chloride  40 mEq Oral BID  . senna  1 tablet Oral Daily  . spironolactone  25 mg Oral Daily    Assessment/Plan: 65 AAM with progressive mantle cell lymphoma and intractable left sided abdominal pain, left sided hydronephrosis and hydroureter. S/p ureteral stent placement   1.  Hypokalemia. 2.  Hydronephrosis s/p ureteral stent placement. 3.  Anemia unspecified.  Plan:  Will need to repeat serum K value today, continue 0.9 NS with 21meq of KCL, as well as PO repletion.   Follow magnesium periodically as well.      LOS: 4 days   Caleb Francis 10/28/2014,9:47 AM

## 2014-10-29 ENCOUNTER — Encounter: Payer: Self-pay | Admitting: Hematology and Oncology

## 2014-10-29 ENCOUNTER — Other Ambulatory Visit: Payer: Self-pay | Admitting: Hematology and Oncology

## 2014-10-29 DIAGNOSIS — R000 Tachycardia, unspecified: Secondary | ICD-10-CM

## 2014-10-29 DIAGNOSIS — R140 Abdominal distension (gaseous): Secondary | ICD-10-CM | POA: Insufficient documentation

## 2014-10-29 LAB — POTASSIUM: Potassium: 4.5 mmol/L (ref 3.5–5.1)

## 2014-10-29 LAB — NA AND K (SODIUM & POTASSIUM), RAND UR
Potassium Urine: 35 mmol/L
Sodium, Ur: 67 mmol/L

## 2014-10-29 MED ORDER — ENSURE ENLIVE PO LIQD: 237.0000 mL | Freq: Three times a day (TID) | ORAL | Status: DC

## 2014-10-29 MED ORDER — OXYBUTYNIN CHLORIDE 5 MG PO TABS: 5.0000 mg | ORAL_TABLET | Freq: Three times a day (TID) | ORAL | Status: DC | PRN

## 2014-10-29 MED ORDER — MAGNESIUM OXIDE 400 (241.3 MG) MG PO TABS: 400.0000 mg | ORAL_TABLET | Freq: Two times a day (BID) | ORAL | Status: DC

## 2014-10-29 MED ORDER — MORPHINE SULFATE 2 MG/ML IJ SOLN
2.0000 mg | INTRAMUSCULAR | Status: DC | PRN
  Administered 2014-10-29 (×2): 2 mg via INTRAVENOUS
  Filled 2014-10-29 (×2): qty 1

## 2014-10-29 MED ORDER — SPIRONOLACTONE 25 MG PO TABS: 25.0000 mg | ORAL_TABLET | Freq: Every day | ORAL | Status: DC

## 2014-10-29 MED ORDER — ALLOPURINOL 300 MG PO TABS: 300.0000 mg | ORAL_TABLET | Freq: Every day | ORAL | Status: DC

## 2014-10-29 MED ORDER — AMLODIPINE BESYLATE 5 MG PO TABS: 5.0000 mg | ORAL_TABLET | Freq: Every day | ORAL | Status: DC

## 2014-10-29 NOTE — Progress Notes (Signed)
Subjective: Pt feeling a bit better today. K up to 4.5.  Urine K appeared to be low.   Objective: Vital signs in last 24 hours: Temp:  [98.2 F (36.8 C)-100 F (37.8 C)] 98.6 F (37 C) (05/17 0513) Pulse Rate:  [95-119] 108 (05/17 0513) Resp:  [20-22] 22 (05/17 0513) BP: (101-133)/(60-84) 115/75 mmHg (05/17 0958) SpO2:  [100 %] 100 % (05/17 0513) Weight change:   Intake/Output from previous day:  Intake/Output Summary (Last 24 hours) at 10/29/14 1223 Last data filed at 10/29/14 0813  Gross per 24 hour  Intake   4940 ml  Output   2975 ml  Net   1965 ml    Intake/Output this shift: Total I/O In: -  Out: 1075 [Urine:1075]  General appearance: NAD HEENT: Atraumatic; EOMI, hearing intact, OM moist Neck: Trachea midline; supple Lungs: CTAB, with normal respiratory effort  CV: S1S2, no obvious rub Abdomen: Soft, NTND, BS present Extremities: 2+ LLE edema Skin: Warm and dry, no rashes noted. Neuro/Psych: Awake, alert, following commands, normal mood  Lab Results: Results for orders placed or performed during the hospital encounter of 10/24/14 (from the past 24 hour(s))  Potassium     Status: None   Collection Time: 10/29/14  4:53 AM  Result Value Ref Range   Potassium 4.5 3.5 - 5.1 mmol/L    Studies/Results: No results found.  [name] allopurinol  300 mg Oral Daily  . amLODipine  5 mg Oral Daily  . bisacodyl  10 mg Rectal Daily  . docusate sodium  100 mg Oral BID  . feeding supplement (ENSURE ENLIVE)  237 mL Oral TID BM  . magnesium oxide  400 mg Oral BID  . senna  1 tablet Oral Daily  . spironolactone  25 mg Oral Daily    Assessment/Plan: 56 AAM with progressive mantle cell lymphoma and intractable left sided abdominal pain, left sided hydronephrosis and hydroureter. S/p ureteral stent placement   1.  Hypokalemia. 2.  Hydronephrosis s/p ureteral stent placement. 3.  Anemia unspecified.  Plan:   Labs reviewed, urine K low at 21 at present.  Pt is on  spironolactone however which maybe helping to preserve K.   In any case K improved to 4.5.  At this time would continue spironolactone as well as magnesium oxide.  Will continue to periodically monitor  Serum K.  We would be happy to follow this patient as an outpt.     LOS: 5 days   Talena Neira 10/29/2014,12:23 PM

## 2014-10-29 NOTE — Progress Notes (Signed)
[facility] Hematology/Oncology Progress Note  Date of admission: 10/24/2014  Hospital day:  10/29/2014  Chief Complaint: Caleb Francis is a 69 y.o. male with progressive mantle cell lymphoma who was admitted with intractable pain associated with left sided hydronephrosis.  Subjective: Initially felt to be a good day and ready for discharge.  Pain controlled except with singultus (hiccups).  Pain felt limiting patient's discharge plans.  Unclear if any bladder spasms.  Social History: The patient is alone today.  Allergies: No Known Allergies  Scheduled Medications: . allopurinol  300 mg Oral Daily  . amLODipine  5 mg Oral Daily  . bisacodyl  10 mg Rectal Daily  . docusate sodium  100 mg Oral BID  . feeding supplement (ENSURE ENLIVE)  237 mL Oral TID BM  . magnesium oxide  400 mg Oral BID  . senna  1 tablet Oral Daily  . spironolactone  25 mg Oral Daily   Review of Systems: GENERAL:  Hiccups aggravating pain.  No fevers, sweats or weight loss. PERFORMANCE STATUS (ECOG):  2 HEENT:  No visual changes, runny nose, sore throat, mouth sores or tenderness. Lungs: No shortness of breath or cough.  No hemoptysis. Cardiac:  No chest pain, palpitations, orthopnea, or PND. GI:  No nausea, vomiting, diarrhea, constipation, melena or hematochezia. GU:  Left sided abdominal pain controlled unless has hiccups.  Good urine output.  No urgency, frequency, dysuria, or hematuria. Musculoskeletal:  No back pain.  No joint pain.  No muscle tenderness. Extremities:  No pain or swelling. Skin:  No rashes or skin changes. Neuro:  No headache, numbness or weakness, balance or coordination issues. Endocrine:  No diabetes, thyroid issues, hot flashes or night sweats. Psych:  No mood changes, depression or anxiety. Pain:  Left lower quadrant pain. Review of systems:  All other systems reviewed and found to be negative.  Physical Exam: Blood pressure 133/75, pulse 107,  temperature 98.6 F (37 C), temperature source Oral, resp. rate 22, height 5' 9"  (1.753 m), weight 165 lb (74.844 kg), SpO2 94 %.  GENERAL: Well developed, well nourished gentleman lying in bed with increased pain. MENTAL STATUS: Alert and oriented to person, place and time. HEAD: [name] with male pattern baldness. Normocephalic, atraumatic, face symmetric, no Cushingoid features. EYES: Brown eyes. Pupils equal round and reactive to light and accomodation. No conjunctivitis or scleral icterus. ENT: Oropharynx clear without lesion. Tongue normal. Mucous membranes moist.  RESPIRATORY: Clear to auscultation without rales, wheezes or rhonchi. CARDIOVASCULAR: Regular rate and rhythm without murmur, rub or gallop. ABDOMEN: General abdominal fullness. Left sided palpable mass.  No guarding or rebound tenderness.  Active bowel sounds.  No hepatosplenomegaly. SKIN: No rashes, ulcers or lesions. EXTREMITIES:Left lower extremity edema (stable). No palpable cords.  NEUROLOGICAL: Unremarkable. PSYCH: Appropriate.  Results for orders placed or performed during the hospital encounter of 10/24/14 (from the past 48 hour(s))  Glucose, capillary     Status: Abnormal   Collection Time: 10/27/14 10:14 PM  Result Value Ref Range   Glucose-Capillary 123 (H) 65 - 99 mg/dL  TSH     Status: None   Collection Time: 10/28/14  5:52 AM  Result Value Ref Range   TSH 1.810 0.350 - 4.500 uIU/mL  Basic metabolic panel     Status: Abnormal   Collection Time: 10/28/14  5:52 AM  Result Value Ref Range   Sodium 134 (L) 135 - 145 mmol/L   Potassium 3.6 3.5 - 5.1 mmol/L   Chloride 97 (  L) 101 - 111 mmol/L   CO2 28 22 - 32 mmol/L   Glucose, Bld 89 65 - 99 mg/dL   BUN 7 6 - 20 mg/dL   Creatinine, Ser 0.74 0.61 - 1.24 mg/dL   Calcium 8.3 (L) 8.9 - 10.3 mg/dL   GFR calc non Af Amer >60 >60 mL/min   GFR calc Af Amer >60 >60 mL/min    Comment: (NOTE) The eGFR has been calculated using the CKD EPI  equation. This calculation has not been validated in all clinical situations. eGFR's persistently <60 mL/min signify possible Chronic Kidney Disease.    Anion gap 9 5 - 15  Potassium     Status: None   Collection Time: 10/29/14  4:53 AM  Result Value Ref Range   Potassium 4.5 3.5 - 5.1 mmol/L  Na and K (sodium & potassium), rand urine     Status: None   Collection Time: 10/29/14 11:20 AM  Result Value Ref Range   Sodium, Ur 67 mmol/L   Potassium Urine Timed 35 mmol/L   Assessment:  Caleb Francis is a 69 y.o. male with progressive mantle cell lymphoma admitted with intractable left sided abdominal pain. Imaging studies reveal left sided hydronephrosis and hydroureter. Patient post operative day #5 status post left internal ureteral stent placement. Patient's pain initially controlled then aggravated by singultus (hiccups).  Hiccups managed with thorazine.  Plan: 1. Hematology/Oncology-  Discussed with patient initiation of outpatient chemotherapy on 10/28/2014.  Still awaiting insurance approval for RICE chemotherapy  On allopurinol.  Pain may continue to worsen with rapidly increasing disease. 2. Nephrology- Urine output good.  Low potassium in urine.  Potassium normal today after signifianct supplimentation and initiation of spironolactone.  Suppliment K as needed. Continue magnesium supplimentation. 3. Pain/Toxicology-  Pain aggravated by hiccups.  Initially pain controlled with Percocet.  Morphine 2 mg IV q 2 hours prn added for severe pain.  Will add Ditropan for possible bladder spasms. 4. Disposition-  Postpone discharge today until pain controlled.  Lequita Asal, MD  10/29/2014, 5:49 PM

## 2014-10-29 NOTE — Progress Notes (Signed)
Patient ID: Caleb Francis, male   DOB: 18-Oct-1945, 69 y.o.   MRN: [id number]  [facility] NAME: Caleb Francis    MR#:  [id number]  DATE OF BIRTH:  [birth year]  SUBJECTIVE: Feels good. Still has some left groin area, not much of  abdominal discomfort as well as left sided lower extremity swelling. He has less pain overall, on pain meds, K has improved CHIEF COMPLAINT:  Follow up for hypokalemia, c/o pain in L abdomen, good urinary output. Some SOB on exertion, PT worked with him y-day, dc with HH PT likely today.  Review of Systems  Constitutional: Negative for fever and chills.  Respiratory: Negative for cough, shortness of breath and wheezing.   Cardiovascular: Negative for chest pain, orthopnea, leg swelling and PND.  Gastrointestinal: Negative for nausea, vomiting, abdominal pain and diarrhea.  Genitourinary: Negative for dysuria and hematuria.  Neurological: Negative for dizziness, tremors, focal weakness and headaches.    VITAL SIGNS: Blood pressure 115/75, pulse 108, temperature 98.6 F (37 C), temperature source Oral, resp. rate 22, height 5\' 9"  (1.753 m), weight 74.844 kg (165 lb), SpO2 100 %.  PHYSICAL EXAMINATION:   GENERAL:  69 y.o.-year-old patient lying in the bed with no acute distress.  EYES: Pupils equal, round, reactive to light and accommodation. No scleral icterus. Extraocular muscles intact.  HEENT: Head atraumatic, normocephalic. Oropharynx and nasopharynx clear.  NECK:  Supple, no jugular venous distention. No thyroid enlargement, no tenderness.  LUNGS: Normal breath sounds bilaterally, no wheezing, rales,rhonchi or crepitation. No use of accessory muscles of respiration.  CARDIOVASCULAR: S1, S2 normal. No murmurs, rubs, or gallops.  ABDOMEN: Soft, L sided tenderness, , nondistended. Bowel sounds present. No organomegaly or mass.  EXTREMITIES: Significant lower extremity, especially thigh edema on  the left, no cyanosis, or clubbing.  NEUROLOGIC: Cranial nerves II through XII are intact. Muscle strength 5/5 in all extremities. Sensation intact. Gait not checked.  PSYCHIATRIC: The patient is alert and oriented x 3.  SKIN: No obvious rash, lesion, or ulcer.  L groin  area papin on palaption.  ORDERS/RESULTS REVIEWED:   CBC  Recent Labs Lab 10/22/14 2130 10/24/14 1721 10/25/14 0549 10/27/14 0541  WBC 8.0 7.3 7.4 7.2  HGB 12.4* 12.4* 11.4* 10.5*  HCT 36.9* 37.6* 32.6* 31.2*  PLT 94* 92* 81* 86*  MCV 84.4 84.8 84.2 83.8  MCH 28.3 28.0 29.3 28.2  MCHC 33.5 33.1 34.8 33.7  RDW 20.6* 20.3* 20.2* 19.9*  LYMPHSABS 1.4 2.0 1.5 1.1  MONOABS 1.1* 1.4* 1.4* 1.9*  EOSABS 0.0 0.2 0.2 0.2  BASOSABS 0.0 0.0 0.0 0.1   ------------------------------------------------------------------------------------------------------------------  Chemistries   Recent Labs Lab 10/22/14 2130 10/24/14 1721 10/25/14 0549  10/26/14 0606 10/26/14 1507 10/27/14 0541 10/27/14 1542 10/28/14 0552 10/29/14 0453  NA 136 134* 134*  --  136  --  135  --  134*  --   K 3.0* 3.0* 2.6*  < > 3.0* 3.1* 2.9* 3.0* 3.6 4.5  CL 101 95* 99*  --  102  --  99*  --  97*  --   CO2 26 27 24   --  26  --  29  --  28  --   GLUCOSE 112* 100* 121*  --  107*  --  92  --  89  --   BUN 9 15 15   --  10  --  8  --  7  --   CREATININE 0.95  1.16 1.07  --  0.88  --  0.77  --  0.74  --   CALCIUM 8.9 9.0 7.8*  --  8.0*  --  8.1*  --  8.3*  --   MG  --   --  1.9  --   --   --   --  1.8  --   --   AST 15 19  --   --   --   --   --   --   --   --   ALT 8* 9*  --   --   --   --   --   --   --   --   ALKPHOS 63 79  --   --   --   --   --   --   --   --   BILITOT 1.0 2.1*  --   --   --   --   --   --   --   --   < > = values in this interval not displayed. ------------------------------------------------------------------------------------------------------------------ estimated creatinine clearance is 90.8 mL/min (by C-G formula based  on Cr of 0.74). ------------------------------------------------------------------------------------------------------------------  Recent Labs  10/28/14 0552  TSH 1.810    Cardiac Enzymes No results for input(s): CKMB, TROPONINI, MYOGLOBIN in the last 168 hours.  Invalid input(s): CK ------------------------------------------------------------------------------------------------------------------ Invalid input(s): POCBNP ---------------------------------------------------------------------------------------------------------------  RADIOLOGY: No results found.  EKG: No orders found for this or any previous visit.  ASSESSMENT AND PLAN:  1. Hypokalemia, etiology is likely poor po intake and renal losses, per nephrology, now on spirolonlactone . Continue Mg supplements po and spironolactone, now off K supplements, encouraged PO intake again 2. Some nausea and mild abdominal distention ; constipation resolved, continue colace, senna, some better  PO intake, continue ensure, d/w pt extensively today again 3. Hypertension blood pressure appears to be stable continue Norvasc as taking at home. 4. 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  5 . Left lower extremity swelling, due to lymphoma and impaired lymph return, no DVT noted on Doppler ultrasound earlier on admission.   Management plans discussed with the patient, family and they are in agreement.   DRUG ALLERGIES: No Known Allergies  CODE STATUS:     Code Status Orders        Start     Ordered   10/24/14 1710  Full code   Continuous     10/24/14 1709      TOTAL TIME TAKING CARE OF THIS PATIENT: 35 minutes.    Theodoro Grist M.D on 10/29/2014 at 10:47 AM  Between 7am to 6pm - Pager - [phone]  After 6pm go to [email or web address] - password [email or web address] [facility]  [facility]  Office  [phone]  CC: Primary care physician; No PCP Per Patient

## 2014-10-29 NOTE — Plan of Care (Signed)
Problem: Discharge Progression Outcomes Goal: Discharge plan in place and appropriate Individualization  Outcome: Progressing Pt prefers to be called Caleb Francis. Hx of arthritis and HTN controlled by home medications Pt is accepting of condition and active in care. Goal: Other Discharge Outcomes/Goals Outcome: Progressing Patient is calm, cooperative. Alert and oriented, remains independent. C/o hiccups, thorazine IV given with improvement. No complaints of pain.

## 2014-10-29 NOTE — Care Management (Addendum)
RNCM met with patient to discuss discharge planning. He states he lives alone but independent with daily needs. His PCP is with [facility]. His pharmacy is with [facility]. He would like to use [facility] for home health. Referral made to Julienne Kass with [facility]. RNCM will continue to follow. Per RN patient still having some swelling in left leg which was present prior to stent placement per RN. Patient has a rolling walker available at home.

## 2014-10-29 NOTE — Progress Notes (Signed)
Physical Therapy Treatment Patient Details Name: Caleb Francis MRN: [id number] DOB: December 01, 1945 Today's Date: 10/29/2014    History of Present Illness Pt had a stent palced in L LE 5/12, continues to have some L LE pain. Pt is feeling better, and planning on getting home soon after DC.    PT Comments    Pt progressing well, as evidence by inclusion of stairs training, however, pt still limited by fatigue and pain. Patient presents with impairment of strength, pain, and activity tolerance, limiting ability to perform ADL, IADL, and ambulation. Patient will benefit from skilled intervention to address the above impairments and limitations, in order to restore to prior level of function and to decrease caregiver burden.    Follow Up Recommendations  Home health PT     Equipment Recommendations  None recommended by PT    Recommendations for Other Services       Precautions / Restrictions Precautions Precautions: Fall Restrictions Weight Bearing Restrictions: No    Mobility  Bed Mobility Overal bed mobility: Modified Independent                Transfers Overall transfer level: Modified independent Equipment used: Straight cane             General transfer comment: Good sequencing and balance noted, requiring near max effort to rise from low surface.   Ambulation/Gait Ambulation/Gait assistance: Supervision Ambulation Distance (Feet): 240 Feet (pt declined additional amb, but aggreeable to stairs) Assistive device: Straight cane Gait Pattern/deviations: Antalgic   Gait velocity interpretation: <1.8 ft/sec, indicative of risk for recurrent falls General Gait Details: Antalgic gait with pain during LLE weight bearing but not increased at rest. Overall pt is steady and stable without evidence for balance disturbance   Stairs Stairs: Yes Stairs assistance: Min guard Stair Management: Two rails;With cane Number of Stairs: 3 General stair comments: very  labored effort and mildly unsteady; pt performing with good safety awareness.   Wheelchair Mobility    Modified Rankin (Stroke Patients Only)       Balance Overall balance assessment: Modified Independent (SPC or IV pole for balance. )                                  Cognition Arousal/Alertness: Awake/alert Behavior During Therapy: WFL for tasks assessed/performed Overall Cognitive Status: Within Functional Limits for tasks assessed                      Exercises      General Comments        Pertinent Vitals/Pain Pain Assessment: 0-10 Pain Score: 6  Pain Location: Groin Pain Intervention(s): Limited activity within patient's tolerance;Monitored during session    Home Living                      Prior Function            PT Goals (current goals can now be found in the care plan section) Acute Rehab PT Goals Patient Stated Goal: "I want to go back home" PT Goal Formulation: With patient Time For Goal Achievement: 11/10/14 Potential to Achieve Goals: Good Progress towards PT goals: Progressing toward goals    Frequency  Min 2X/week    PT Plan Discharge plan needs to be updated    Co-evaluation             End of Session Equipment Utilized During  Treatment: Gait belt   Patient left: in bed (EOB with urinal, RN made aware. )     Time: 4715-9539 PT Time Calculation (min) (ACUTE ONLY): 12 min  Charges:  $Gait Training: 8-22 mins                    G Codes:      Caleb Francis 2014-11-26, 10:13 AM  Caleb Francis, PT, DPT, BM

## 2014-10-29 NOTE — Plan of Care (Signed)
Problem: Discharge Progression Outcomes Goal: Discharge plan in place and appropriate Individualization  Pt prefers to be called Caleb Francis. Hx of arthritis and HTN controlled by home medications Pt is accepting of condition and active in care. Moderate fall precautions Goal: Other Discharge Outcomes/Goals Plan of Care Progress to Goal: Pt has had persistent severe pain today.  Morphine was added back in.  D/c was suspended until pt's pain is resolved.

## 2014-10-29 NOTE — Progress Notes (Signed)
[facility] day:  10/24/2014  Chief Complaint: Caleb Francis is an 69 y.o. male with mantle cell lymphoma who is seen for reassessment after interval emergency room visit for abdominal pain.  HPI: The patient was last seen in the medical oncology clinic on 09/18/2014.  At that time, recent PET scan revealed early progressive disease.  He was to complete his remaining Ibrutinib.  He was referred to the [facility] transplant team for consideration of autologous stem cell transplant after salvage chemotherapy.  He has not met with the transplant team.  He presented to the emergency room last evening with left lower abdominal pain. Imaging studies revealed significant enlargement of diffuse retroperitoneal, bilateral pelvic, and left groin adenopathy. There was diffuse edema in the left leg. There was left hydronephrosis and hydroureter. Creatinine was normal. Duplex revealed no evidence of DVT.  Past Medical History  Diagnosis Date  . Arthritis   . Hypertension   . RA (rheumatoid arthritis)   . Anemia   . Cancer     lymphoma    Past Surgical History  Procedure Laterality Date  . Back surgery    . Fracture surgery      ankle  . Portacath placement    . Cystoscopy w/ ureteral stent placement Left 10/24/2014    Procedure: CYSTOSCOPY WITH RETROGRADE PYELOGRAM/URETERAL STENT PLACEMENT;  Surgeon: Irine Seal, MD;  Location: [facility] ORS;  Service: Urology;  Laterality: Left;    Family History  Problem Relation Age of Onset  . Cancer Mother     breast  . Cancer Father     bone cancer    Social History:  reports that he quit smoking 11 days ago. His smoking use included Cigarettes. He has a 30 pack-year smoking history. He does not have any smokeless tobacco history on file. He reports that he does not drink alcohol or use illicit drugs.  The patient is alone today.  Allergies: No Known Allergies  Current Medications: No current  facility-administered medications for this visit.   Current Outpatient Prescriptions  Medication Sig Dispense Refill  . docusate sodium (COLACE) 100 MG capsule Take 1 capsule (100 mg total) by mouth 2 (two) times daily. 10 capsule 0  . KLOR-CON M10 10 MEQ tablet Take 2 tablets (20 mEq total) by mouth 2 (two) times daily. 120 tablet 2  . oxyCODONE-acetaminophen (PERCOCET/ROXICET) 5-325 MG per tablet Take 1-2 tablets by mouth every 4 (four) hours as needed for moderate pain. 30 tablet 0   Facility-Administered Medications Ordered in Other Visits  Medication Dose Route Frequency Provider Last Rate Last Dose  . 0.9 % NaCl with KCl 40 mEq / L  infusion   Intravenous Continuous Munsoor Lateef, MD 75 mL/hr at 10/29/14 0236 75 mL/hr at 10/29/14 0236  . acetaminophen (TYLENOL) tablet 650 mg  650 mg Oral Q4H PRN Evlyn Kanner, NP      . allopurinol (ZYLOPRIM) tablet 300 mg  300 mg Oral Daily Lequita Asal, MD   300 mg at 10/28/14 1906  . alum & mag hydroxide-simeth (MAALOX/MYLANTA) 200-200-20 MG/5ML suspension 30 mL  30 mL Oral Q6H PRN Dustin Flock, MD   30 mL at 10/28/14 2106  . amLODipine (NORVASC) tablet 5 mg  5 mg Oral Daily Evlyn Kanner, NP   5 mg at 10/28/14 0808  . bisacodyl (DULCOLAX) suppository 10 mg  10 mg Rectal Daily Theodoro Grist, MD   10 mg at 10/28/14 0807  . docusate sodium (  COLACE) capsule 100 mg  100 mg Oral BID Theodoro Grist, MD   100 mg at 10/28/14 0809  . feeding supplement (ENSURE ENLIVE) (ENSURE ENLIVE) liquid 237 mL  237 mL Oral TID BM Theodoro Grist, MD   237 mL at 10/28/14 1745  . magnesium oxide (MAG-OX) tablet 400 mg  400 mg Oral BID Theodoro Grist, MD   400 mg at 10/28/14 1959  . oxyCODONE-acetaminophen (PERCOCET/ROXICET) 5-325 MG per tablet 1-2 tablet  1-2 tablet Oral Q4H PRN Lloyd Huger, MD   2 tablet at 10/28/14 1445  . potassium chloride 20 MEQ/15ML (10%) solution 40 mEq  40 mEq Oral BID Theodoro Grist, MD   40 mEq at 10/28/14 1959  . senna (SENOKOT)  tablet 8.6 mg  1 tablet Oral Daily Theodoro Grist, MD   8.6 mg at 10/28/14 0808  . senna-docusate (Senokot-S) tablet 1 tablet  1 tablet Oral QHS PRN Evlyn Kanner, NP      . spironolactone (ALDACTONE) tablet 25 mg  25 mg Oral Daily Harmeet Singh, MD   25 mg at 10/28/14 9562  . zolpidem (AMBIEN) tablet 5 mg  5 mg Oral QHS PRN Evlyn Kanner, NP       Review of Systems:  GENERAL: Feels bad. Decreased activity secondary to pain. No fevers, sweats or weight loss. PERFORMANCE STATUS (ECOG): 2 HEENT: No visual changes, runny nose, sore throat, mouth sores or tenderness. Lungs: No shortness of breath or cough. No hemoptysis. Cardiac: No chest pain, palpitations, orthopnea, or PND. GI: Left lower quadrant abdominal pain. No nausea, vomiting, diarrhea, constipation, melena or hematochezia. GU: No change in urine output. No urgency, frequency, dysuria, or hematuria. Musculoskeletal: No lower back pain. No joint pain. No muscle tenderness. Extremities: No pain or swelling. Skin: No rashes or skin changes. Neuro: No headache, numbness or weakness, balance or coordination issues. Endocrine: No diabetes, thyroid issues, hot flashes or night sweats. Psych: No mood changes, depression or anxiety. Pain: Abdominal pain (left side). Review of systems: All other systems reviewed and found to be negative.  Physical Exam: Blood pressure 138/82, pulse 97, temperature 99.7 F (37.6 C), temperature source Tympanic, height 5' 9"  (1.753 m), weight 169 lb 5 oz (76.8 kg). GENERAL: Well developed, well nourished, uncomfortable gentleman who at times is doubled over in pain. MENTAL STATUS: Alert and oriented to person, place and time. HEAD: Short gray hair. Normocephalic, atraumatic, face symmetric, no Cushingoid features. EYES: Brown eyes. Pupils equal round and reactive to light and accomodation. No conjunctivitis or scleral icterus. ENT: Oropharynx clear without lesion. Tongue  normal. Mucous membranes moist.  RESPIRATORY: Clear to auscultation without rales, wheezes or rhonchi. CARDIOVASCULAR: Regular rate and rhythm without murmur, rub or gallop. ABDOMEN: Soft, extensive fullness/ mass left lower quadrant extending to bladder. Slightly tender to palpation. No guarding or rebound tenderness. Active bowel sounds, and no hepatosplenomegaly.  SKIN: No rashes, ulcers or lesions. EXTREMITIES: Left lower extremity edema extending into thigh. No skin discoloration or tenderness. No palpable cords. LYMPH NODES: Left axillary node 5-6 cm. Left groin mass-like fullness. No palpable cervical, supraclavicular adenopathy. NEUROLOGICAL: Unremarkable. PSYCH: Appropriate  Assessment:  QUANDARIUS NILL is a 69 year old African-American gentleman with a history of stage IIIB mantle cell lymphoma.  He did not have a baselline bone marrow.  He presented with extensive denopathy.  Right cervical lymph node biopsy on 03/13/2013 confirmed mantle cell lymphoma.   He received 6 cycles of Bendamustine/Rituxan from 03/15/2013 - 07/13/2013.  He received 2 additional  infusions of Rituxan (08/10/2013 and 10/01/2013).  He had a partial response (75% decrease in adenopathy).  He then received 6 cycles of RCHOP from 10/22/2013 - 02/11/2014.  He had a dose adjustment in vincristine because of constipation and lower extremity neuropathy.    PET/CT on 03/14/2014 demonstrated a partial response with further significant but incomplete metabolic response to therapy in the neck, chest, abdomen, and pelvis.  Chest, abdomen, and pelvic CT scan on 05/24/2014 revealed stable mild axillary adenopathy, no mediastinal or pulmonary masses, mild retroperitoneal and iliac pelvic adenopathy (1.2 cm) which had decreased in volume slightly.  He has a history of chronic hypokalemia of unclear etiology.  He has been taking his potassium "off and on".  He has renal insufficiency.  Renal ultrasound on 08/20/2014  revealed no hydronephrosis.  He began Ibrutinib in 02/2014.  PET scan on 09/16/2014 reveals early progressive disease (size, numer and hypermetabolism of lymph nodes).  Abdominal and pelvic CT scan on 10/23/2014 revealed progressive disease with significant enlargement of diffuse retroperitoneal, bilateral pelvic, and left groin nodes.  There is diffuse edema in the left leg.  There is left hydronephrosis and hydroureter due to extrinsic compression by adenopathy.  Symptomatically, he has significant left sided pain.  Exam reveals enlarging nodes.  Plan: 1. Admit to hospital for pain management and left ureteral stent placement. 2. Labs today:  CBC with diff, PT/INR, BMP, uric acid. 3. Contact urology (Dr Elnoria Howard and Dr Jeffie Pollock- on call). 4. Contact interventional urology for possible percutaneous urostomy tube per Dr. Jeffie Pollock. 5. Discuss salvage chemotherapy followed by possible stem cell transplant if responsive disease. 6. Phone follow-up with [facility] transplant team- done (FAX records [phone]). 7. Preauth RICE chemotherapy. 8. RTC next week to initiate chemotherapy.  Lequita Asal, MD  10/24/2014, 5:10 PM

## 2014-10-30 ENCOUNTER — Encounter: Payer: Self-pay | Admitting: Physician Assistant

## 2014-10-30 ENCOUNTER — Inpatient Hospital Stay: Payer: Medicare Other

## 2014-10-30 DIAGNOSIS — I10 Essential (primary) hypertension: Secondary | ICD-10-CM

## 2014-10-30 DIAGNOSIS — N1330 Unspecified hydronephrosis: Secondary | ICD-10-CM

## 2014-10-30 DIAGNOSIS — I495 Sick sinus syndrome: Secondary | ICD-10-CM

## 2014-10-30 LAB — BASIC METABOLIC PANEL
Anion gap: 8 (ref 5–15)
BUN: 10 mg/dL (ref 6–20)
CO2: 28 mmol/L (ref 22–32)
Calcium: 8.8 mg/dL — ABNORMAL LOW (ref 8.9–10.3)
Chloride: 96 mmol/L — ABNORMAL LOW (ref 101–111)
Creatinine, Ser: 0.81 mg/dL (ref 0.61–1.24)
GFR calc Af Amer: >60 mL/min (ref >60)
GFR calc non Af Amer: >60 mL/min (ref >60)
Glucose, Bld: 107 mg/dL — ABNORMAL HIGH (ref 65–99)
Potassium: 4.6 mmol/L (ref 3.5–5.1)
Sodium: 132 mmol/L — ABNORMAL LOW (ref 135–145)

## 2014-10-30 MED ORDER — SODIUM CHLORIDE 0.9 % IV SOLN
25.0000 mg | Freq: Once | INTRAVENOUS | Status: AC
  Administered 2014-10-30: 07:00:00 25 mg via INTRAVENOUS
  Filled 2014-10-30: qty 1

## 2014-10-30 MED ORDER — IOHEXOL 350 MG/ML SOLN
75.0000 mL | Freq: Once | INTRAVENOUS | Status: AC | PRN
  Administered 2014-10-30: 75 mL via INTRAVENOUS

## 2014-10-30 MED ORDER — ACETAMINOPHEN 325 MG PO TABS: 650.0000 mg | ORAL_TABLET | ORAL | Status: DC | PRN

## 2014-10-30 MED ORDER — METOPROLOL TARTRATE 25 MG PO TABS
25.0000 mg | ORAL_TABLET | Freq: Four times a day (QID) | ORAL | Status: DC
  Administered 2014-10-30: 20:00:00 25 mg via ORAL
  Filled 2014-10-30 (×2): qty 1

## 2014-10-30 MED ORDER — CHLORPROMAZINE HCL 25 MG PO TABS
25.0000 mg | ORAL_TABLET | Freq: Once | ORAL | Status: AC
  Administered 2014-10-30: 11:00:00 25 mg via ORAL
  Filled 2014-10-30: qty 1

## 2014-10-30 MED ORDER — ACETAMINOPHEN 325 MG PO TABS
650.0000 mg | ORAL_TABLET | ORAL | Status: DC | PRN
  Administered 2014-10-30: 650 mg via ORAL

## 2014-10-30 MED ORDER — OXYBUTYNIN CHLORIDE 5 MG PO TABS: 5.0000 mg | ORAL_TABLET | Freq: Three times a day (TID) | ORAL | Status: DC | PRN

## 2014-10-30 MED ORDER — METOPROLOL TARTRATE 25 MG PO TABS
12.5000 mg | ORAL_TABLET | Freq: Two times a day (BID) | ORAL | Status: DC
  Administered 2014-10-30: 12.5 mg via ORAL
  Filled 2014-10-30: qty 1

## 2014-10-30 NOTE — Progress Notes (Signed)
Physical Therapy Treatment Patient Details Name: Caleb Francis MRN: [id number] DOB: Mar 14, 1946 Today's Date: 10/30/2014    History of Present Illness Pt had a stent palced in L LE 5/12, and reports that most pain has resolved as of today. Pt reports he is feeling abuit sleepy and lethargic, but is agreeable to participate. Pt is feeling better, and planning on DC to home today.    PT Comments    Pt making moderate progress toward goals, however, pt unable to complete stairs at this time due to treatment cessation after tachycardia at rest that worsened with activity. PT will consult with RN regarding findings and attempt treatment at later date/time if appropriate. Patient presents with impairment of strength, balance, and activity tolerance, limiting ability to perform ADL, IADL, and ambulation. Patient will benefit from skilled intervention to address the above impairments and limitations, in order to restore to prior level of function and to decrease caregiver burden.    Follow Up Recommendations  Home health PT     Equipment Recommendations  None recommended by PT    Recommendations for Other Services       Precautions / Restrictions Precautions Precautions: None Restrictions Weight Bearing Restrictions: No    Mobility  Bed Mobility Overal bed mobility: Modified Independent                Transfers Overall transfer level: Modified independent Equipment used: Straight cane                Ambulation/Gait Ambulation/Gait assistance: Min guard Ambulation Distance (Feet): 80 Feet Assistive device: Straight cane Gait Pattern/deviations: Antalgic   Gait velocity interpretation: <1.8 ft/sec, indicative of risk for recurrent falls General Gait Details: Pt appears unsteady today, more so thatn yesterday, but able to maintain balance with single UE support to perform toiletting at comode while standing. Pt reachign for additional UE support such as counter tops  and railings today. Session ended early due to tachy in 140's with minimal activity.    Stairs            Wheelchair Mobility    Modified Rankin (Stroke Patients Only)       Balance Overall balance assessment: Modified Independent                                  Cognition Arousal/Alertness:  (Asleep at entry, easily rousable, with maintained somnolence. ) Behavior During Therapy: WFL for tasks assessed/performed Overall Cognitive Status: Within Functional Limits for tasks assessed                      Exercises      General Comments        Pertinent Vitals/Pain Pain Assessment: No/denies pain    Home Living                      Prior Function            PT Goals (current goals can now be found in the care plan section) Acute Rehab PT Goals Patient Stated Goal: "I want to go back home" PT Goal Formulation: With patient Time For Goal Achievement: 11/10/14 Potential to Achieve Goals: Good Progress towards PT goals: Progressing toward goals    Frequency  Min 2X/week    PT Plan Current plan remains appropriate    Co-evaluation  End of Session Equipment Utilized During Treatment: Gait belt   Patient left: in chair;with call bell/phone within reach     Time: 0835-0850 PT Time Calculation (min) (ACUTE ONLY): 15 min  Charges:  $Therapeutic Activity: 8-22 mins                    G Codes:      Verda Mehta C 2014-11-05, 9:03 AM  Etta Grandchild, PT, DPT, BM

## 2014-10-30 NOTE — Care Management (Signed)
Patient states he plans to go home today. Holley Dexter with [facility] notified of patient plan pending discharge orders for home health.

## 2014-10-30 NOTE — Progress Notes (Signed)
Notified Dr Tressia Miners by phone that pt has fever and tylenol indication is for mild pain only. MD verbalized to modify tylenol indication to mild pain or fever.

## 2014-10-30 NOTE — Progress Notes (Signed)
[facility] Hematology/Oncology Progress Note  Date of admission: 10/24/2014  Hospital day:  10/30/2014  Chief Complaint: Caleb Francis is a 69 y.o. male with progressive mantle cell lymphoma who was admitted with intractable pain associated with left sided hydronephrosis.  Subjective: Plan once again was to discharge today, the patient noted to be significantly tachycardic with heart rate in 150s upon ambulation. Appreciate cardiology input. Patient is asymptomatic and offers no complaints today.  Allergies: No Known Allergies  Scheduled Medications: . allopurinol  300 mg Oral Daily  . bisacodyl  10 mg Rectal Daily  . docusate sodium  100 mg Oral BID  . feeding supplement (ENSURE ENLIVE)  237 mL Oral TID BM  . magnesium oxide  400 mg Oral BID  . metoprolol tartrate  12.5 mg Oral BID  . senna  1 tablet Oral Daily  . spironolactone  25 mg Oral Daily   Review of Systems: GENERAL:  Hiccups aggravating pain.  No fevers, sweats or weight loss. PERFORMANCE STATUS (ECOG):  2 HEENT:  No visual changes, runny nose, sore throat, mouth sores or tenderness. Lungs: No shortness of breath or cough.  No hemoptysis. Cardiac:  No chest pain, palpitations, orthopnea, or PND. GI:  No nausea, vomiting, diarrhea, constipation, melena or hematochezia. GU:  Left sided abdominal pain controlled unless has hiccups.  Good urine output.  No urgency, frequency, dysuria, or hematuria. Musculoskeletal:  No back pain.  No joint pain.  No muscle tenderness. Extremities:  No pain or swelling. Skin:  No rashes or skin changes. Neuro:  No headache, numbness or weakness, balance or coordination issues. Endocrine:  No diabetes, thyroid issues, hot flashes or night sweats. Psych:  No mood changes, depression or anxiety. Pain:  Left lower quadrant pain. Review of systems:  All other systems reviewed and found to be negative.  Physical Exam: Blood pressure 116/61, pulse 153, temperature 98.1  F (36.7 C), temperature source Oral, resp. rate 20, height 5' 9"  (1.753 m), weight 165 lb (74.844 kg), SpO2 97 %.  GENERAL: Well developed, well nourished gentleman lying in bed with increased pain. MENTAL STATUS: Alert and oriented to person, place and time. HEAD: [name] with male pattern baldness. Normocephalic, atraumatic, face symmetric, no Cushingoid features. EYES: Brown eyes. Pupils equal round and reactive to light and accomodation. No conjunctivitis or scleral icterus. ENT: Oropharynx clear without lesion. Tongue normal. Mucous membranes moist.  RESPIRATORY: Clear to auscultation without rales, wheezes or rhonchi. CARDIOVASCULAR: Regular rate and rhythm without murmur, rub or gallop. ABDOMEN: General abdominal fullness. Left sided palpable mass.  No guarding or rebound tenderness.  Active bowel sounds.  No hepatosplenomegaly. SKIN: No rashes, ulcers or lesions. EXTREMITIES:Left lower extremity edema (stable). No palpable cords.  NEUROLOGICAL: Unremarkable. PSYCH: Appropriate.  Results for orders placed or performed during the hospital encounter of 10/24/14 (from the past 48 hour(s))  Potassium     Status: None   Collection Time: 10/29/14  4:53 AM  Result Value Ref Range   Potassium 4.5 3.5 - 5.1 mmol/L  Na and K (sodium & potassium), rand urine     Status: None   Collection Time: 10/29/14 11:20 AM  Result Value Ref Range   Sodium, Ur 67 mmol/L   Potassium Urine Timed 35 mmol/L  Basic metabolic panel     Status: Abnormal   Collection Time: 10/30/14  4:59 AM  Result Value Ref Range   Sodium 132 (L) 135 - 145 mmol/L   Potassium 4.6 3.5 - 5.1 mmol/L  Chloride 96 (L) 101 - 111 mmol/L   CO2 28 22 - 32 mmol/L   Glucose, Bld 107 (H) 65 - 99 mg/dL   BUN 10 6 - 20 mg/dL   Creatinine, Ser 0.81 0.61 - 1.24 mg/dL   Calcium 8.8 (L) 8.9 - 10.3 mg/dL   GFR calc non Af Amer >60 >60 mL/min   GFR calc Af Amer >60 >60 mL/min    Comment: (NOTE) The eGFR has been  calculated using the CKD EPI equation. This calculation has not been validated in all clinical situations. eGFR's persistently <60 mL/min signify possible Chronic Kidney Disease.    Anion gap 8 5 - 15   Assessment:  Caleb Francis is a 69 y.o. male with progressive mantle cell lymphoma admitted with intractable left sided abdominal pain. Imaging studies reveal left sided hydronephrosis and hydroureter. Patient post operative day #5 status post left internal ureteral stent placement. Patient's pain initially controlled then aggravated by singultus (hiccups).  Hiccups managed with thorazine.  Plan: 1. Hematology/Oncology-  Discussed with patient initiation of outpatient chemotherapy, probably next week.  Still awaiting insurance approval for RICE chemotherapy  On allopurinol.  Pain may continue to worsen with rapidly increasing disease. 2. Nephrology- Urine output good.  Low potassium in urine.  Potassium normal today after signifianct supplimentation and initiation of spironolactone.  Suppliment K as needed. Continue magnesium supplimentation. 3. Pain/Toxicology-  Pain aggravated by hiccups.  Initially pain controlled with Percocet.  Morphine 2 mg IV q 2 hours prn added for severe pain.  Will add Ditropan for possible bladder spasms. 4. Tachycardia: Appreciate cardiology input. Will monitor patient overnight on telemetry as metoprolol dose is adjusted. 5. Disposition-  possible discharge tomorrow.    Caleb Huger, MD  10/30/2014, 2:51 PM

## 2014-10-30 NOTE — Plan of Care (Signed)
Problem: Discharge Progression Outcomes Goal: Discharge plan in place and appropriate Individualization  Outcome: Progressing Pt prefers to be called Caleb Francis. Hx of arthritis and HTN controlled by home medications Pt is accepting of condition and active in care. Moderate fall precautions Goal: Other Discharge Outcomes/Goals Outcome: Progressing Patient is alert and oriented, c/o pain in left groin. Morphine and oxycodone given with relief. Resting quietly.

## 2014-10-30 NOTE — Progress Notes (Signed)
Dr. Reece Levy notified patient c/o hiccups. Maalox does not help. Two nights ago Thorazine 25 mg IV stopped the hiccups until this morning. Ordered the same dose once this morning.

## 2014-10-30 NOTE — Plan of Care (Signed)
Problem: Discharge Progression Outcomes Goal: Other Discharge Outcomes/Goals Outcome: Not Progressing [name] pain better controlled.  But HR increased - jumped to 150's when he wked w/PT.  D/c home postponed.  On observation tonight on tele and after being started on metoprolol. Hiccups controlled w/thorazine.

## 2014-10-30 NOTE — Progress Notes (Addendum)
Patient ID: Caleb Francis, male   DOB: 06/17/1945, 69 y.o.   MRN: [id number]  [facility] NAME: Caleb Francis    MR#:  [id number]  DATE OF BIRTH:  04/09/1946  SUBJECTIVE: was planned for D/C but got very tachycardic on working with PT and D/C cancelled for furthur w/up   Review of Systems  Constitutional: Positive for malaise/fatigue. Negative for fever, chills and weight loss.  HENT: Negative for nosebleeds and sore throat.   Eyes: Negative for blurred vision.  Respiratory: Negative for cough, shortness of breath and wheezing.   Cardiovascular: Positive for palpitations. Negative for chest pain, orthopnea, leg swelling and PND.  Gastrointestinal: Negative for heartburn, nausea, vomiting, abdominal pain, diarrhea and constipation.  Genitourinary: Negative for dysuria and urgency.  Musculoskeletal: Negative for back pain.  Skin: Negative for rash.  Neurological: Positive for weakness. Negative for dizziness, speech change, focal weakness and headaches.  Endo/Heme/Allergies: Does not bruise/bleed easily.  Psychiatric/Behavioral: Negative for depression.    VITAL SIGNS: Blood pressure 118/65, pulse 117, temperature 99.5 F (37.5 C), temperature source Oral, resp. rate 20, height 5\' 9"  (1.753 m), weight 74.844 kg (165 lb), SpO2 100 %.  PHYSICAL EXAMINATION:   GENERAL:  69 y.o.-year-old patient lying in the bed with no acute distress.  EYES: Pupils equal, round, reactive to light and accommodation. No scleral icterus. Extraocular muscles intact.  HEENT: Head atraumatic, normocephalic. Oropharynx and nasopharynx clear.  NECK:  Supple, no jugular venous distention. No thyroid enlargement, no tenderness.  LUNGS: Normal breath sounds bilaterally, no wheezing, rales,rhonchi or crepitation. No use of accessory muscles of respiration.  CARDIOVASCULAR: S1, S2 normal. No murmurs, rubs, or gallops.  ABDOMEN: Soft, L sided  tenderness, , nondistended. Bowel sounds present. No organomegaly or mass.  EXTREMITIES: Significant lower extremity, especially thigh edema on the left, no cyanosis, or clubbing.  NEUROLOGIC: Cranial nerves II through XII are intact. Muscle strength 5/5 in all extremities. Sensation intact. Gait not checked.  PSYCHIATRIC: The patient is alert and oriented x 3.  SKIN: No obvious rash, lesion, or ulcer.  L groin  area papin on palaption.  ORDERS/RESULTS REVIEWED:   CBC  Recent Labs Lab 10/24/14 1721 10/25/14 0549 10/27/14 0541  WBC 7.3 7.4 7.2  HGB 12.4* 11.4* 10.5*  HCT 37.6* 32.6* 31.2*  PLT 92* 81* 86*  MCV 84.8 84.2 83.8  MCH 28.0 29.3 28.2  MCHC 33.1 34.8 33.7  RDW 20.3* 20.2* 19.9*  LYMPHSABS 2.0 1.5 1.1  MONOABS 1.4* 1.4* 1.9*  EOSABS 0.2 0.2 0.2  BASOSABS 0.0 0.0 0.1   ------------------------------------------------------------------------------------------------------------------  Chemistries   Recent Labs Lab 10/24/14 1721 10/25/14 0549  10/26/14 0606  10/27/14 0541 10/27/14 1542 10/28/14 0552 10/29/14 0453 10/30/14 0459  NA 134* 134*  --  136  --  135  --  134*  --  132*  K 3.0* 2.6*  < > 3.0*  < > 2.9* 3.0* 3.6 4.5 4.6  CL 95* 99*  --  102  --  99*  --  97*  --  96*  CO2 27 24  --  26  --  29  --  28  --  28  GLUCOSE 100* 121*  --  107*  --  92  --  89  --  107*  BUN 15 15  --  10  --  8  --  7  --  10  CREATININE 1.16 1.07  --  0.88  --  0.77  --  0.74  --  0.81  CALCIUM 9.0 7.8*  --  8.0*  --  8.1*  --  8.3*  --  8.8*  MG  --  1.9  --   --   --   --  1.8  --   --   --   AST 19  --   --   --   --   --   --   --   --   --   ALT 9*  --   --   --   --   --   --   --   --   --   ALKPHOS 79  --   --   --   --   --   --   --   --   --   BILITOT 2.1*  --   --   --   --   --   --   --   --   --   < > = values in this interval not  displayed. ------------------------------------------------------------------------------------------------------------------ estimated creatinine clearance is 89.7 mL/min (by C-G formula based on Cr of 0.81). ------------------------------------------------------------------------------------------------------------------  Recent Labs  10/28/14 0552  TSH 1.810    ASSESSMENT AND PLAN:  * sinus tachy: EKG, cardio c/s, d/w dr Rockey Situ, hold off D/C. May need rate controlling agent.  1. Hypokalemia: repleted and resolved 2. Some nausea and mild abdominal distention: resolved, having some hiccups - thorazin helped 3. Hypertension blood pressure appears to be stable continue Norvasc as taking at home. 4. 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  5 . Left lower extremity swelling, due to lymphoma and impaired lymph return, no DVT noted on Doppler ultrasound earlier on admission.   Management plans discussed with the patient, family and they are in agreement.   DRUG ALLERGIES: No Known Allergies  CODE STATUS:     Code Status Orders        Start     Ordered   10/24/14 1710  Full code   Continuous     10/24/14 1709      TOTAL TIME TAKING CARE OF THIS PATIENT: 35 minutes.   Holding off D/C today. Possible d/c in 1-2 days depending on clinical condition.   [facility], Caleb Francis M.D on 10/30/2014 at 3:09 PM  Between 7am to 6pm - Pager - [phone]  After 6pm go to [email or web address] - password [email or web address] [facility]  [facility]  Office  [phone]  CC: Primary care physician; No PCP Per Patient

## 2014-10-30 NOTE — Plan of Care (Signed)
Problem: Discharge Progression Outcomes Goal: Discharge plan in place and appropriate Individualization  Individualization: [name] d/c delayed again b/c of elevated HR.  Pt put on tele and metoprolol.  Had 12 lead EKG and chest CT.

## 2014-10-30 NOTE — Progress Notes (Signed)
Physical Therapy Treatment Patient Details Name: Caleb Francis MRN: [id number] DOB: 05/19/1946 Today's Date: 10/30/2014    History of Present Illness Pt had a stent palced in L LE 5/12, and reports that most pain has resolved as of today. Pt reports he is feeling abuit sleepy and lethargic, but is agreeable to participate. Pt is feeling better, and planning on DC to home today. This morning's session was ended early due to elevated HR, with need to consult RN further prior to additional activity.     PT Comments    Pt alert and oriented x4 upon entry. Pt resting comfortably with HR in the 120's, no acute distress. Pt agreeable to ambulation and attempt at additional stairs training to demonstrate ability to safely enter the home indep. Pt HR is trending in mid 140's with household ambulation speed, and HR: 73bpm (pulse oximeter), with stairs training, with audible tachypnea. Unable to immediately obtain a strong radial pulse after activity, but based on readings from pulse oximeter, communicated concerns for potential episode of afib after stairs. Once back in room, radial pulse obtained, HR:153BPM which is ~99% of calculated max HR, weak and irregular. Patient presents with impairment of strength, pain, range of motion, and activity tolerance, limiting ability to perform ADL, IADL, and ambulation. Patient will benefit from skilled intervention to address the above impairments and limitations, in order to restore to prior level of function and to decrease caregiver burden.    Follow Up Recommendations  Home health PT     Equipment Recommendations  None recommended by PT    Recommendations for Other Services       Precautions / Restrictions Precautions Precautions: None Restrictions Weight Bearing Restrictions: No    Mobility  Bed Mobility Overal bed mobility: Modified Independent                Transfers Overall transfer level: Modified independent Equipment used:  Straight cane             General transfer comment: Good sequencing and balance noted, requiring near max effort to rise from low surface.   Ambulation/Gait Ambulation/Gait assistance: Min guard Ambulation Distance (Feet): 240 Feet Assistive device: Straight cane Gait Pattern/deviations: Antalgic   Gait velocity interpretation: <1.8 ft/sec, indicative of risk for recurrent falls General Gait Details: Pt appears unsteady today, more so thatn yesterday, but able to maintain balance with single UE support to perform toiletting at comode. Pt reaching for additional UE support such as counter tops and railings today. Hr remains in 140's - 150's with activity.    Stairs Stairs: Yes Stairs assistance: Min guard Stair Management: One rail Right;With cane Number of Stairs: 6 General stair comments: very labored effort and mildly unsteady; pt performing with good safety awareness, but weakness in legs is apparent by lack of control while descending the last three steps.   Wheelchair Mobility    Modified Rankin (Stroke Patients Only)       Balance Overall balance assessment: Modified Independent                                  Cognition Arousal/Alertness: Awake/alert Behavior During Therapy: WFL for tasks assessed/performed Overall Cognitive Status: Within Functional Limits for tasks assessed                      Exercises      General Comments  Pertinent Vitals/Pain Pain Assessment: No/denies pain    Home Living Family/patient expects to be discharged to:: Private residence Living Arrangements: Alone                  Prior Function            PT Goals (current goals can now be found in the care plan section) Acute Rehab PT Goals Patient Stated Goal: "I want to go back home" PT Goal Formulation: With patient Time For Goal Achievement: 11/10/14 Potential to Achieve Goals: Good Progress towards PT goals: Progressing toward  goals    Frequency  Min 2X/week    PT Plan Current plan remains appropriate    Co-evaluation             End of Session Equipment Utilized During Treatment: Gait belt   Patient left: with call bell/phone within reach;in bed     Time: 6144-3154 PT Time Calculation (min) (ACUTE ONLY): 21 min  Charges:  $Therapeutic Activity: 8-22 mins                    G Codes:      Agron Swiney C November 17, 2014, 11:08 AM  Etta Grandchild, PT, DPT, BM

## 2014-10-30 NOTE — Consult Note (Addendum)
Cardiology Consultation Note  Patient ID: Caleb Francis, MRN: [id number], DOB/AGE: 07-28-1945 69 y.o. Admit date: 10/24/2014   Date of Consult: 10/30/2014 Primary Physician: No PCP Per Patient Primary Cardiologist: [facility] to [facility]  Chief Complaint: Intractable abdominal pain with associated hydronephrosis s/p left sided ureteral stenting   Reason for Consult: Tachycardia   HPI: 69 y.o. male with h/o stage IIIB mantle cell lymphoma s/p chemotherapy with Bendamustine/Rituxan 03/15/2013 - 07/13/2013, two additional doses of Rituxan in 08/10/2013 and 10/01/2013, and Ibrutinib 02/2014, HTN, COPD, ongoing tobacco abuse, RA, chronic hypokalemia, and anemia who was admitted to [facility] on 5/12 with intractable left sided abdominal pain, left sided hydronephrosis s/p left sided ureteral stenting on 5/12. When he was assessed be PT today he was noted to have a resting HR in the 120s that further increased to the 150s with exertion. Cardiology is consulted for further evaluation.   He has previously undergone 2 stress tests. Most recently 12/2013 a nuclear stress test, which he does not remember why that showed no significant ischemia, no EKG changes, no artifact, EF 63%, low risk study. He has never undergone a cardiac cath. He has known stage IIIB mantle cell lymphoma s/p chemotherapy as above. He did not have a baseline bone marrow biopsy. PET/CT on 03/14/2014 demonstrated a partial response with further significant but incomplete metabolic response to therapy in the neck, chest, abdomen, and pelvis. Chest, abdomen, and pelvic CT scan on 05/24/2014 revealed stable mild axillary adenopathy, no mediastinal or pulmonary masses, mild retroperitoneal and iliac pelvic adenopathy (1.2 cm) which had decreased in volume slightly. He has a history of chronic hypokalemia of unclear etiology. He has been taking his potassium "off and on". He has renal insufficiency. Renal ultrasound on 08/20/2014 revealed no  hydronephrosis.  He presented to the ER on 5/11 with left lower abdominal pain. Imaging studies revealed significant enlargement of diffuse retroperitoneal, bilateral pelvic, and left groin adenopathy.There was diffuse edema in the left leg.There was left hydronephrosis and hydroureter.SCr was normal.Lower extremity duplex revealed no evidence of DVT. He followed up in the medical oncology clinic and was directly admitted for medical management of his intractable pain. He underwent successful placement of left ureteral stent on 5/12. He required aggressive repletion of his hypokalemia via IV and po treatment throughout his hospitalization and was ultimately able to get to a goal of 4.0 to greater than 4.0. He continued to have significant lower abdominal pain throughout his hospitalization. Urine output was good. PT worked with him daily. While PT was working with him today he was noted to have a resting heart rate in the 120s that increased to the 140s to 150s with exertion. He became mildly SOB with exertion. He completed his PT exercises. Upon my consultation he is not on telemetry for review. His pulse is 126 at rest,strong, and regular. He is not SOB. Pulse ox was checked and found to be 100% on room air. He denies any chest pain.   Past Medical History  Diagnosis Date  . Arthritis   . Hypertension   . RA (rheumatoid arthritis)   . Anemia   . Cancer     lymphoma  . History of nuclear stress test     a. 12/2013: low risk, no sig ischemia, no EKG changes, no artifact, EF 63%      Most Recent Cardiac Studies: Nuclear stress test 12/2013:   No significant ischemia No EKG changes concerning for ischemia No artifact EF 63% Low risk study  Surgical History:  Past Surgical History  Procedure Laterality Date  . Back surgery    . Fracture surgery      ankle  . Portacath placement    . Cystoscopy w/ ureteral stent placement Left 10/24/2014    Procedure: CYSTOSCOPY WITH RETROGRADE  PYELOGRAM/URETERAL STENT PLACEMENT;  Surgeon: Irine Seal, MD;  Location: [facility] ORS;  Service: Urology;  Laterality: Left;     Home Meds: Prior to Admission medications   Medication Sig Start Date End Date Taking? Authorizing Provider  amLODipine (NORVASC) 10 MG tablet Take 10 mg by mouth.   Yes Historical Provider, MD  folic acid (FOLVITE) 1 MG tablet Take 1 mg by mouth.   Yes Historical Provider, MD  meloxicam (MOBIC) 7.5 MG tablet Take 7.5 mg by mouth.   Yes Historical Provider, MD  oxyCODONE (OXY IR/ROXICODONE) 5 MG immediate release tablet Take 1 tablet (5 mg total) by mouth every 4 (four) hours as needed for severe pain. 10/24/14  Yes Lequita Asal, MD  predniSONE (DELTASONE) 5 MG tablet Take 5 mg by mouth.   Yes Historical Provider, MD  allopurinol (ZYLOPRIM) 300 MG tablet Take 1 tablet (300 mg total) by mouth daily. 10/29/14   Lequita Asal, MD  amLODipine (NORVASC) 5 MG tablet Take 1 tablet (5 mg total) by mouth daily. 10/29/14   Lequita Asal, MD  docusate sodium (COLACE) 100 MG capsule Take 1 capsule (100 mg total) by mouth 2 (two) times daily. 10/27/14   Lloyd Huger, MD  feeding supplement, ENSURE ENLIVE, (ENSURE ENLIVE) LIQD Take 237 mLs by mouth 3 (three) times daily between meals. 10/29/14   Lequita Asal, MD  magnesium oxide (MAG-OX) 400 (241.3 MG) MG tablet Take 1 tablet (400 mg total) by mouth 2 (two) times daily. 10/29/14   Lequita Asal, MD  oxybutynin (DITROPAN) 5 MG tablet Take 1 tablet (5 mg total) by mouth every 8 (eight) hours as needed for bladder spasms (spasms or stent related pain). 10/30/14   Max Sane, MD  oxyCODONE-acetaminophen (PERCOCET/ROXICET) 5-325 MG per tablet Take 1-2 tablets by mouth every 4 (four) hours as needed for moderate pain. 10/27/14   Lloyd Huger, MD  spironolactone (ALDACTONE) 25 MG tablet Take 1 tablet (25 mg total) by mouth daily. 10/29/14   Lequita Asal, MD    Inpatient Medications:  . allopurinol  300 mg  Oral Daily  . amLODipine  5 mg Oral Daily  . bisacodyl  10 mg Rectal Daily  . docusate sodium  100 mg Oral BID  . feeding supplement (ENSURE ENLIVE)  237 mL Oral TID BM  . magnesium oxide  400 mg Oral BID  . senna  1 tablet Oral Daily  . spironolactone  25 mg Oral Daily      Allergies: No Known Allergies  History   Social History  . Marital Status: Single    Spouse Name: N/A  . Number of Children: N/A  . Years of Education: N/A   Occupational History  . Not on file.   Social History Main Topics  . Smoking status: Former Smoker -- 0.50 packs/day for 60 years    Types: Cigarettes    Quit date: 10/18/2014  . Smokeless tobacco: Not on file  . Alcohol Use: No  . Drug Use: No  . Sexual Activity: Not on file   Other Topics Concern  . Not on file   Social History Narrative     Family History  Problem Relation Age of  Onset  . Cancer Mother     breast  . Cancer Father     bone cancer     Review of Systems: Review of Systems  Constitutional: Positive for malaise/fatigue. Negative for fever, chills and diaphoresis.       Weight fluctuates +/- 5 pounds  HENT: Negative for sore throat.   Eyes: Negative for discharge and redness.  Respiratory: Negative for cough, hemoptysis, sputum production, shortness of breath, wheezing and stridor.   Cardiovascular: Positive for leg swelling. Negative for chest pain, palpitations, orthopnea, claudication and PND.       Swelling of left lower extremity - improving  Gastrointestinal: Negative for heartburn, nausea, vomiting, blood in stool and melena.  Musculoskeletal: Negative for falls.  Skin: Negative for rash.  Neurological: Positive for weakness. Negative for tremors, focal weakness and loss of consciousness.  Psychiatric/Behavioral: Negative for depression, suicidal ideas and hallucinations. The patient is not nervous/anxious.   All other systems were reviewed and negative.   Labs: No results for input(s): CKTOTAL, CKMB,  TROPONINI in the last 72 hours. Lab Results  Component Value Date   WBC 7.2 10/27/2014   HGB 10.5* 10/27/2014   HCT 31.2* 10/27/2014   MCV 83.8 10/27/2014   PLT 86* 10/27/2014    Recent Labs Lab 10/24/14 1721  10/30/14 0459  NA 134*  < > 132*  K 3.0*  < > 4.6  CL 95*  < > 96*  CO2 27  < > 28  BUN 15  < > 10  CREATININE 1.16  < > 0.81  CALCIUM 9.0  < > 8.8*  PROT 7.4  --   --   BILITOT 2.1*  --   --   ALKPHOS 79  --   --   ALT 9*  --   --   AST 19  --   --   GLUCOSE 100*  < > 107*  < > = values in this interval not displayed. No results found for: CHOL, HDL, LDLCALC, TRIG No results found for: DDIMER  Radiology/Studies:  Ct Abdomen Pelvis W Contrast  10/23/2014   CLINICAL DATA:  Swelling to the left upper thigh since last night. Left lower abdominal pain. History of lymphoma with chemotherapy 2015.  EXAM: CT ABDOMEN AND PELVIS WITH CONTRAST  TECHNIQUE: Multidetector CT imaging of the abdomen and pelvis was performed using the standard protocol following bolus administration of intravenous contrast.  CONTRAST:  124m OMNIPAQUE IOHEXOL 300 MG/ML SOLN, 271mOMNIPAQUE IOHEXOL 240 MG/ML SOLN  COMPARISON:  PET-CT scan 09/16/2014  FINDINGS: Dependent atelectasis in the lung bases.  Cholelithiasis with multiple stones in the gallbladder. No gallbladder wall thickening or infiltration. Multiple accessory spleens. Liver, spleen, pancreas, adrenal glands, and inferior vena cava are unremarkable. Calcification of the abdominal aorta without aneurysm. There is fairly prominent retroperitoneal and periaortic lymphadenopathy extending all the way down into the pelvis with diffuse bilateral iliac chain lymphadenopathy, greater on the left. Left periaortic lymph nodes measure up to 5.9 by 3.5 cm diameter. Left pelvic lymph nodes measure up to 5 by 4.2 cm diameter. Scattered lymph nodes in the celiac axis are upper limits of normal in size. Lymphadenopathy demonstrated in the left groin. Diffuse soft  tissue edema in the left upper leg without discrete fluid collection. There is hydronephrosis and hydroureter on the left probably due to extrinsic compression from left pelvic lymphadenopathy. Lymph nodes in all areas demonstrate enlargement since the previous PET-CT scan. Findings suggest progression of lymphoma.  Stomach, small bowel,  and colon are not abnormally distended. No free air or free fluid in the abdomen. Abdominal wall musculature appears intact.  Pelvis: Prostate gland is not enlarged. Bladder wall is mildly thickened, possibly indicating infection. Appendix is normal. No evidence of diverticulitis. Degenerative changes of the spine. No destructive bone lesions.  IMPRESSION: Significant interval enlargement of diffuse retroperitoneal, bilateral pelvic, and left groin lymph nodes. Lymphadenopathy is most prominent on the left. Diffuse edema demonstrated in the left upper leg. Left renal hydronephrosis and hydroureter probably due to extrinsic compression by lymphadenopathy. Findings suggest progression of lymphoma.   Electronically Signed   By: Lucienne Capers M.D.   On: 10/23/2014 01:29   US Venous Img Lower Unilateral Left  10/22/2014   CLINICAL DATA:  Left leg swelling for 2 days. Patient is on aspirin.  EXAM: Left LOWER EXTREMITY VENOUS DOPPLER ULTRASOUND  TECHNIQUE: Gray-scale sonography with graded compression, as well as color Doppler and duplex ultrasound were performed to evaluate the lower extremity deep venous systems from the level of the common femoral vein and including the common femoral, femoral, profunda femoral, popliteal and calf veins including the posterior tibial, peroneal and gastrocnemius veins when visible. The superficial great saphenous vein was also interrogated. Spectral Doppler was utilized to evaluate flow at rest and with distal augmentation maneuvers in the common femoral, femoral and popliteal veins.  COMPARISON:  None.  FINDINGS: Contralateral Common Femoral Vein:  Respiratory phasicity is normal and symmetric with the symptomatic side. No evidence of thrombus. Normal compressibility.  Common Femoral Vein: No evidence of thrombus. Normal compressibility, respiratory phasicity and response to augmentation.  Saphenofemoral Junction: No evidence of thrombus. Normal compressibility and flow on color Doppler imaging.  Profunda Femoral Vein: No evidence of thrombus. Normal compressibility and flow on color Doppler imaging.  Femoral Vein: No evidence of thrombus. Normal compressibility, respiratory phasicity and response to augmentation.  Popliteal Vein: No evidence of thrombus. Normal compressibility, respiratory phasicity and response to augmentation.  Calf Veins: Peroneal vein is not visualized. Visualized posterior tibial vein demonstrates no evidence of thrombus. Normal compressibility and flow on color Doppler imaging.  Superficial Great Saphenous Vein: No evidence of thrombus. Normal compressibility and flow on color Doppler imaging.  Venous Reflux:  None.  Other Findings: Incidental note of prominent lymph nodes in the left groin area. Central flow and fatty hilum are demonstrated. This is likely reactive.  IMPRESSION: No evidence of deep venous thrombosis.   Electronically Signed   By: Lucienne Capers M.D.   On: 10/22/2014 22:45   Dg Abd 2 Views  10/25/2014   CLINICAL DATA:  Ureteral stent  EXAM: ABDOMEN - 2 VIEW  COMPARISON:  CT 10/23/2014  FINDINGS: Double-J left ureteral stent extends from the left renal pelvis to the bladder in expected location. Oral contrast from recent CT noted within the colon. Mild bibasilar atelectasis.  IMPRESSION: Left ureteral stent in expected location.   Electronically Signed   By: Suzy Bouchard M.D.   On: 10/25/2014 13:42    EKG: sinus tachycardia, 128 bpm, TWI I, V4-V5, nonspecific st/t changes V6  Weights: Filed Weights   10/24/14 1705 10/24/14 1816  Weight: 165 lb 4 oz (74.957 kg) 165 lb (74.844 kg)     Physical  Exam: Blood pressure 107/68, pulse 153, temperature 98.1 F (36.7 C), temperature source Oral, resp. rate 20, height 5' 9"  (1.753 m), weight 165 lb (74.844 kg), SpO2 97 %. Body mass index is 24.36 kg/(m^2). General: Well developed, well nourished, in no acute distress.  Head: Normocephalic, atraumatic, sclera non-icteric, no xanthomas, nares are without discharge.  Neck: Negative for carotid bruits. JVD not elevated. Lungs: Clear bilaterally to auscultation without wheezes, rales, or rhonchi. Breathing is unlabored. Heart: Tachycardia, with S1 S2. No murmurs, rubs, or gallops appreciated. Abdomen: Soft, non-tender, non-distended with normoactive bowel sounds. No hepatomegaly. No rebound/guarding. No obvious abdominal masses. Msk:  Strength and tone appear normal for age. Extremities: No clubbing or cyanosis. 1+ pitting edema left lower extremity to knee.   Neuro: Alert and oriented X 3. No facial asymmetry. No focal deficit. Moves all extremities spontaneously. Psych:  Responds to questions appropriately with a normal affect.    Assessment and Plan:  69 y.o. male with h/o stage IIIB mantle cell lymphoma s/p chemotherapy with Bendamustine/Rituxan 03/15/2013 - 07/13/2013, two additional doses of Rituxan in 08/10/2013 and 10/01/2013, and Ibrutinib 02/2014, HTN, COPD, ongoing tobacco abuse, RA, chronic hypokalemia, and anemia who was admitted to [facility] on 5/12 with intractable left sided abdominal pain and left sided hydronephrosis s/p left sided ureteral stenting on 5/12. When he was assessed by PT he was noted to be tachycardic with HR in the 120s that further increased to the 150s with exertion.   1. Sinus tachycardia: -12 lead EKG shows sinus tachycardia with heart rate of 128 bpm -Resting heart rate in the 120s currently with further increase to the 150s with exertion  -Add Lopressor 12.5 mg bid today, with further titration possible in the AM if BP allows  -Discontinue amlodipine to allow for  further titration of beta blocker  -TSH, K+, and magnesium all ok  -Had negative lower extremity doppler on 5/10, pulse ox 100% on room air making PE less likely though still a possibility given his sudden onset of sinus tachycardia in the setting of hypercoagulable state. Other etiologies include deconditioning and exacerbation of pain -Monitor on telemetry while inpatient   2. HTN: -Controlled -Norvasc discontinued -Start Lopressor as above  3. Mantle cell lymphoma: -Followed by hem/onc  4. Chronic left lower extremity swelling: -2/2 #3 -CTA pelvis with diffuse interval enlargement of retroperitoneal bilateral pelvic and left groin lymph nodes. Lymph adenopathy most prominent on the left -No DVT per study on 10/22/14  5. Chronic hypokalemia: -Resolved with persistent IV and po repletion -Started on spironolactone 25 mg daily  6. Deconditioning/generalized weakness: -Will be discharged with [facility]   Signed, Christell Faith, PA-C Pager: [phone] 10/30/2014, 12:57 PM   Attending Note Patient seen and examined, agree with detailed note above,  Patient presentation and plan discussed on rounds.  Progression of lymphoma noted on CT scan of pelvis and chest,  Causing left lower extremity edema/lymphedema Relatively acute onset  Etiology of tachycardia is unclear though possibly associated with lymphoma. --Echocardiogram has been ordered to rule out pericardial effusion. None noted on CT scan. Does not appear to have other causes that could contribute to tachycardia such as infection, anemia --We'll advance his beta blocker with hold parameters. Goal heart rate 90s at rest. With exertion heart rate has been climbing up to the 140-150 range. Will monitor telemetry. No other significant workup needed   Esmond Plants  M.D., Ph.D. 10/30/14  7pm

## 2014-10-31 ENCOUNTER — Inpatient Hospital Stay (HOSPITAL_COMMUNITY)
Admission: AD | Admit: 2014-10-31 | Discharge: 2014-10-31 | Disposition: A | Discharge status: Home / Self Care | Payer: Medicare Other | Source: Ambulatory Visit | Attending: Cardiovascular Disease | Admitting: Cardiovascular Disease

## 2014-10-31 ENCOUNTER — Ambulatory Visit: Payer: Medicare Other | Admitting: Hematology and Oncology

## 2014-10-31 ENCOUNTER — Ambulatory Visit: Payer: Medicare Other

## 2014-10-31 DIAGNOSIS — E876 Hypokalemia: Secondary | ICD-10-CM

## 2014-10-31 DIAGNOSIS — R000 Tachycardia, unspecified: Secondary | ICD-10-CM

## 2014-10-31 DIAGNOSIS — G893 Neoplasm related pain (acute) (chronic): Secondary | ICD-10-CM

## 2014-10-31 DIAGNOSIS — I351 Nonrheumatic aortic (valve) insufficiency: Secondary | ICD-10-CM

## 2014-10-31 MED ORDER — AMOXICILLIN-POT CLAVULANATE 875-125 MG PO TABS
1.0000 | ORAL_TABLET | Freq: Two times a day (BID) | ORAL | Status: DC
  Administered 2014-10-31 – 2014-11-01 (×3): 1 via ORAL
  Filled 2014-10-31 (×3): qty 1

## 2014-10-31 MED ORDER — ACETAMINOPHEN 325 MG PO TABS: 650.0000 mg | ORAL_TABLET | Freq: Four times a day (QID) | ORAL | Status: AC | PRN

## 2014-10-31 MED ORDER — METOPROLOL TARTRATE 37.5 MG PO TABS: 37.5000 mg | ORAL_TABLET | Freq: Two times a day (BID) | ORAL | Status: DC

## 2014-10-31 MED ORDER — AMOXICILLIN-POT CLAVULANATE 875-125 MG PO TABS: 1.0000 | ORAL_TABLET | Freq: Two times a day (BID) | ORAL | Status: DC

## 2014-10-31 MED ORDER — METOPROLOL TARTRATE 25 MG PO TABS
25.0000 mg | ORAL_TABLET | Freq: Three times a day (TID) | ORAL | Status: DC
  Administered 2014-10-31 – 2014-11-01 (×4): 25 mg via ORAL
  Filled 2014-10-31 (×4): qty 1

## 2014-10-31 NOTE — Progress Notes (Signed)
Patient: Caleb Francis / Admit Date: 10/24/2014 / Date of Encounter: 10/31/2014, 8:23 AM   Subjective: No complaints, eating ok, no increase in leg pain, Fever overnight No SOB. Tele reviewed, improvement in HR with higher dose metoprolol HR 100 this AM. BP borderline low. Metoprolol hold parameters in place  Review of Systems: Review of Systems  Constitutional: Negative.   Respiratory: Negative.   Cardiovascular: Positive for leg swelling.  Gastrointestinal: Negative.   Musculoskeletal:       Left leg swelling, sore  Neurological: Negative.   Psychiatric/Behavioral: Negative.   All other systems reviewed and are negative.  All other systems reviewed and negative.   Objective: Telemetry: SInus tachycardia, rate 103 bpm Physical Exam: Blood pressure 101/65, pulse 100, temperature 99.2 F (37.3 C), temperature source Oral, resp. rate 18, height 5\' 9"  (1.753 m), weight 74.844 kg (165 lb), SpO2 98 %. Body mass index is 24.36 kg/(m^2). General: Well developed, well nourished, in no acute distress. Head: Normocephalic, atraumatic, sclera non-icteric, no xanthomas, nares are without discharge. Neck: Negative for carotid bruits. JVP not elevated. Lungs: Clear bilaterally to auscultation without wheezes, rales, or rhonchi. Breathing is unlabored. Heart: tachycardia, S1 S2 without murmurs, rubs, or gallops.  Abdomen: Soft, non-tender, non-distended with normoactive bowel sounds. No rebound/guarding. Extremities: No clubbing or cyanosis. Left leg with 1+ diffuse edema. Distal pedal pulses are 2+ and equal bilaterally. Neuro: Alert and oriented X 3. Moves all extremities spontaneously. Psych:  Responds to questions appropriately with a normal affect.   Intake/Output Summary (Last 24 hours) at 10/31/14 0823 Last data filed at 10/31/14 [id number]  Gross per 24 hour  Intake    240 ml  Output   1850 ml  Net  -1610 ml    Inpatient Medications:  . allopurinol  300 mg Oral Daily  .  bisacodyl  10 mg Rectal Daily  . docusate sodium  100 mg Oral BID  . feeding supplement (ENSURE ENLIVE)  237 mL Oral TID BM  . magnesium oxide  400 mg Oral BID  . metoprolol tartrate  25 mg Oral Q6H  . senna  1 tablet Oral Daily  . spironolactone  25 mg Oral Daily   Infusions:    Labs:  Recent Labs  10/29/14 0453 10/30/14 0459  NA  --  132*  K 4.5 4.6  CL  --  96*  CO2  --  28  GLUCOSE  --  107*  BUN  --  10  CREATININE  --  0.81  CALCIUM  --  8.8*   No results for input(s): AST, ALT, ALKPHOS, BILITOT, PROT, ALBUMIN in the last 72 hours. No results for input(s): WBC, NEUTROABS, HGB, HCT, MCV, PLT in the last 72 hours. No results for input(s): CKTOTAL, CKMB, TROPONINI in the last 72 hours. Invalid input(s): POCBNP No results for input(s): HGBA1C in the last 72 hours.   Weights: Filed Weights   10/24/14 1705 10/24/14 1816  Weight: 74.957 kg (165 lb 4 oz) 74.844 kg (165 lb)     Radiology/Studies:  Ct Angio Chest Pe W/cm &/or Wo Cm  10/30/2014     IMPRESSION: Negative for pulmonary embolism.  Patchy parenchymal densities in the lower lobes, right side greater the left. Findings could be associated with atelectasis but there may be an infectious or inflammatory process, particularly in the right lower lobe.  Interval enlargement of the lymphadenopathy in the left axilla and left sub pectoralis region. Findings are compatible with progression of the lymphoma.  Few small pulmonary nodules, particularly in the right middle lobe. Recommend attention to these nodules on follow up imaging.   Electronically Signed   By: Markus Daft M.D.   On: 10/30/2014 16:30   Ct Abdomen Pelvis W Contrast  10/23/2014  .  IMPRESSION: Significant interval enlargement of diffuse retroperitoneal, bilateral pelvic, and left groin lymph nodes. Lymphadenopathy is most prominent on the left. Diffuse edema demonstrated in the left upper leg. Left renal hydronephrosis and hydroureter probably due to extrinsic  compression by lymphadenopathy. Findings suggest progression of lymphoma.   Electronically Signed   By: Lucienne Capers M.D.   On: 10/23/2014 01:29   Dg Abd 2 Views  10/25/2014     IMPRESSION: Left ureteral stent in expected location.   Electronically Signed   By: Suzy Bouchard M.D.   On: 10/25/2014 13:42     Assessment and Plan  69 y.o. male  69 y.o. male with h/o stage IIIB mantle cell lymphoma s/p chemotherapy with Bendamustine/Rituxan 03/15/2013 - 07/13/2013, two additional doses of Rituxan in 08/10/2013 and 10/01/2013, and Ibrutinib 02/2014, HTN, COPD, ongoing tobacco abuse, RA, chronic hypokalemia, and anemia who was admitted to [facility] on 5/12 with intractable left sided abdominal pain and left sided hydronephrosis s/p left sided ureteral stenting on 5/12. When he was assessed by PT he was noted to be tachycardic with HR in the 120s that further increased to the 150s with exertion.   1. Sinus tachycardia: Secondary to underlying lymphoma, fever Echo pending from this Am to exclude pericardial effusion and other cardiac pathology Tolerating metoprolol q6 though BP running low ---will change metoprolol to Q8 with hold parameters Will continue to hold amlodipine   2. HTN: -Controlled -Norvasc discontinued -on Lopressor as above  3. Mantle cell lymphoma: -Followed by hem/onc Chemo arrangements being made  4.  left lower extremity swelling: acute Worse since last week -CTA pelvis with diffuse interval enlargement of retroperitoneal bilateral pelvic and left groin lymph nodes. Lymph adenopathy most prominent on the left -No DVT per study on 10/22/14  5. Deconditioning/generalized weakness: -Will be discharged with [facility]  signed  Esmond Plants M.D., Ph.D. 10/31/14 8:28 AM

## 2014-10-31 NOTE — Progress Notes (Signed)
Physical Therapy Treatment Patient Details Name: Caleb Francis MRN: [id number] DOB: [birth year] Today's Date: 10/31/2014    History of Present Illness Pt had a stent palced in L LE 5/12, and reports that most pain has resolved as of today. Pt reports he is feeling abuit sleepy and lethargic, but is agreeable to participate. Pt is now being followed by cardiology due to tachycardia.     PT Comments    Pt motivated to participate today, but very anxious about aggravating suture site at left groin. Pt gave education on initiating safe movement for improved scar healing quality. Pt limited by fatigue, requiring some seated rest in the middle of balance activities. Patient presents with impairment of strength, pain, range of motion, and activity tolerance, limiting ability to perform ADL, IADL, and ambulation. Patient will benefit from skilled intervention to address the above impairments and limitations, in order to restore to prior level of function and to decrease caregiver burden.  :     Follow Up Recommendations  Home health PT     Equipment Recommendations  None recommended by PT    Recommendations for Other Services       Precautions / Restrictions Precautions Precautions: None Restrictions Weight Bearing Restrictions: No    Mobility  Bed Mobility Overal bed mobility: Modified Independent             General bed mobility comments: Exit to left today, more laborious than previous days (right).   Transfers Overall transfer level: Modified independent Equipment used: Straight cane             General transfer comment: Good sequencing and balance noted, requiring near max effort to rise from low surface.   Ambulation/Gait Ambulation/Gait assistance: Min guard Ambulation Distance (Feet): 240 Feet (Pt declined additional distance. ) Assistive device: Straight cane   Gait velocity: .5m/s Gait velocity interpretation: Below normal speed for age/gender General  Gait Details: Gait with decreased power and step length (step-to) on L. Pt denies c/o pain, but reports that L side has swelling.    Stairs Stairs:  (Not performed this session)          Wheelchair Mobility    Modified Rankin (Stroke Patients Only)       Balance Overall balance assessment: Modified Independent (single LOB able to self correct)                                  Cognition Arousal/Alertness: Awake/alert Behavior During Therapy: WFL for tasks assessed/performed Overall Cognitive Status: Within Functional Limits for tasks assessed                      Exercises Other Exercises Other Exercises: Standing lateral weight shifts x12 bilat Other Exercises: standing A/P weight shifts x15 Other Exercises: Standing overhead reach s UE support x12 bilat Other Exercises: Standing cross body reach with trunk rotation s UE support x12    General Comments        Pertinent Vitals/Pain Pain Assessment: No/denies pain    Home Living Family/patient expects to be discharged to:: Private residence Living Arrangements: Alone                  Prior Function            PT Goals (current goals can now be found in the care plan section) Acute Rehab PT Goals Patient Stated Goal: "I want to go back  home" PT Goal Formulation: With patient Time For Goal Achievement: 11/10/14 Potential to Achieve Goals: Good Progress towards PT goals: Progressing toward goals    Frequency  Min 2X/week    PT Plan Current plan remains appropriate    Co-evaluation             End of Session Equipment Utilized During Treatment: Gait belt Activity Tolerance: Patient limited by fatigue (Pt required 5" sitting rest between balance activities.) Patient left: in bed;with call bell/phone within reach     Time: 1418-1445 PT Time Calculation (min) (ACUTE ONLY): 27 min  Charges:  $Therapeutic Activity: 23-37 mins                    G Codes:       Bary Limbach C Nov 22, 2014, 2:52 PM  Etta Grandchild, PT, DPT, BM

## 2014-10-31 NOTE — Plan of Care (Signed)
Problem: Discharge Progression Outcomes Goal: Discharge plan in place and appropriate Individualization  Outcome: Progressing Individualization   Pt prefers to be called Caleb Francis. Hx of arthritis and HTN controlled by home medications Pt is accepting of condition and active in care. Moderate fall precautions    Goal: Other Discharge Outcomes/Goals Outcome: Progressing Plan of care progress to goals:   -Denies pain.   -Metoprolol dose adjusted by cardiologist. BP stable. ST 100-110.   -Tolerating diet.   -Ambulating with PT.

## 2014-10-31 NOTE — Discharge Summary (Addendum)
[name]    MR#:  [id number]  DATE OF BIRTH:  09-Jan-1946  DATE OF ADMISSION:  10/24/2014 ADMITTING PHYSICIAN: Caleb Asal, MD  DATE OF DISCHARGE: 11/01/2014  PRIMARY CARE PHYSICIAN: No PCP Per Patient    ADMISSION DIAGNOSIS:  intracatable pain mantle cell lymphoma left hydronephrosis  DISCHARGE DIAGNOSIS:  Principal Problem:   Hydronephrosis of left kidney Active Problems:   Mantle cell lymphoma   Abdominal distention   Hypokalemia   Neoplasm related pain   Sinus tachycardia  pneumonia  SECONDARY DIAGNOSIS:   Past Medical History  Diagnosis Date  . Arthritis   . Hypertension   . RA (rheumatoid arthritis)   . Anemia   . Cancer     lymphoma  . History of nuclear stress test     a. 12/2013: low risk, no sig ischemia, no EKG changes, no artifact, EF 63%    HOSPITAL COURSE:  Patient is a 69 year old male with above-mentioned medical problems admitted for left hydronephrosis. Underwent left-sided ureteral stent by urology. He had some electrolyte issues in the hospital along with nausea and mild abdominal distention which was thought to be due to constipation which is resolved. Patient was evaluated by oncology Dr. Nolon Francis who recommended outpatient follow-up at [facility]. He developed some acute onset sinus tachycardia while in the hospital with minimal ambulation for which cardiac consultation was obtained. Started him on metoprolol for rate control patient underwent CT scan of the chest to rule out PE which was negative but he was found to have pneumonia he did develop some fever also advised to restart her on oral Augmentin which he is tolerating it fine and is feeling much better and is being discharged home in stable condition.  He was agreeable with discharge plans.   CONSULTS OBTAINED:  Treatment Team:  Caleb Lighter, MD Caleb Flock, MD Caleb Iba,  MD Caleb Sane, MD Caleb Merritts, MD  DRUG ALLERGIES:  No Known Allergies  DISCHARGE MEDICATIONS:   Current Discharge Medication List    START taking these medications   Details  acetaminophen (TYLENOL) 325 MG tablet Take 2 tablets (650 mg total) by mouth every 6 (six) hours as needed for mild pain or fever. Qty: 30 tablet, Refills: 0   Associated Diagnoses: Mantle cell lymphoma    allopurinol (ZYLOPRIM) 300 MG tablet Take 1 tablet (300 mg total) by mouth daily. Qty: 30 tablet, Refills: 2   Associated Diagnoses: Mantle cell lymphoma    amoxicillin-clavulanate (AUGMENTIN) 875-125 MG per tablet Take 1 tablet by mouth every 12 (twelve) hours. Qty: 14 tablet, Refills: 0    docusate sodium (COLACE) 100 MG capsule Take 1 capsule (100 mg total) by mouth 2 (two) times daily. Qty: 10 capsule, Refills: 0    feeding supplement, ENSURE ENLIVE, (ENSURE ENLIVE) LIQD Take 237 mLs by mouth 3 (three) times daily between meals. Qty: 237 mL, Refills: 12    magnesium oxide (MAG-OX) 400 (241.3 MG) MG tablet Take 1 tablet (400 mg total) by mouth 2 (two) times daily. Qty: 30 tablet, Refills: 1    metoprolol tartrate 37.5 MG TABS Take 37.5 mg by mouth 2 (two) times daily. Qty: 60 tablet, Refills: 0    oxybutynin (DITROPAN) 5 MG tablet Take 1 tablet (5 mg total) by mouth every 8 (eight) hours as needed for bladder spasms (spasms or stent related pain). Qty: 30 tablet, Refills: 0   Associated Diagnoses: Mantle cell  lymphoma    oxyCODONE-acetaminophen (PERCOCET/ROXICET) 5-325 MG per tablet Take 1-2 tablets by mouth every 4 (four) hours as needed for moderate pain. Qty: 30 tablet, Refills: 0    spironolactone (ALDACTONE) 25 MG tablet Take 1 tablet (25 mg total) by mouth daily. Qty: 30 tablet, Refills: 1      CONTINUE these medications which have CHANGED   Details  amLODipine (NORVASC) 5 MG tablet Take 1 tablet (5 mg total) by mouth daily. Qty: 30 tablet, Refills: 1   Associated Diagnoses:  Mantle cell lymphoma      CONTINUE these medications which have NOT CHANGED   Details  folic acid (FOLVITE) 1 MG tablet Take 1 mg by mouth.      STOP taking these medications     meloxicam (MOBIC) 7.5 MG tablet      oxyCODONE (OXY IR/ROXICODONE) 5 MG immediate release tablet      predniSONE (DELTASONE) 5 MG tablet      KLOR-CON M10 10 MEQ tablet          DISCHARGE INSTRUCTIONS:     DIET:  Cardiac diet  DISCHARGE CONDITION:  Good  ACTIVITY:  Activity as tolerated  OXYGEN:  Home Oxygen: No.   Oxygen Delivery: room air  DISCHARGE LOCATION:  home   If you experience worsening of your admission symptoms, develop shortness of breath, life threatening emergency, suicidal or homicidal thoughts you must seek medical attention immediately by calling 911 or calling your MD immediately  if symptoms less severe.  You Must read complete instructions/literature along with all the possible adverse reactions/side effects for all the Medicines you take and that have been prescribed to you. Take any new Medicines after you have completely understood and accpet all the possible adverse reactions/side effects.   Please note  You were cared for by a hospitalist during your hospital stay. If you have any questions about your discharge medications or the care you received while you were in the hospital after you are discharged, you can call the unit and asked to speak with the hospitalist on call if the hospitalist that took care of you is not available. Once you are discharged, your primary care physician will handle any further medical issues. Please note that NO REFILLS for any discharge medications will be authorized once you are discharged, as it is imperative that you return to your primary care physician (or establish a relationship with a primary care physician if you do not have one) for your aftercare needs so that they can reassess your need for medications and monitor your lab  values.   On the day of discharge:    VITAL SIGNS:  Blood pressure 101/65, pulse 100, temperature 99.2 F (37.3 C), temperature source Oral, resp. rate 18, height 5\' 9"  (1.753 m), weight 74.844 kg (165 lb), SpO2 98 %.  PHYSICAL EXAMINATION:  GENERAL:  69 y.o.-year-old patient lying in the bed with no acute distress.  EYES: Pupils equal, round, reactive to light and accommodation. No scleral icterus. Extraocular muscles intact.  HEENT: Head atraumatic, normocephalic. Oropharynx and nasopharynx clear.  NECK:  Supple, no jugular venous distention. No thyroid enlargement, no tenderness.  LUNGS: Normal breath sounds bilaterally, no wheezing, rales,rhonchi or crepitation. No use of accessory muscles of respiration.  CARDIOVASCULAR: S1, S2 normal. No murmurs, rubs, or gallops.  ABDOMEN: Soft, non-tender, non-distended. Bowel sounds present. No organomegaly or mass.  EXTREMITIES: No pedal edema, cyanosis, or clubbing.  NEUROLOGIC: Cranial nerves II through XII are intact. Muscle strength 5/5  in all extremities. Sensation intact. Gait not checked.  PSYCHIATRIC: The patient is alert and oriented x 3.  SKIN: No obvious rash, lesion, or ulcer.   DATA REVIEW:   CBC  Recent Labs Lab 10/27/14 0541  WBC 7.2  HGB 10.5*  HCT 31.2*  PLT 86*    Chemistries   Recent Labs Lab 10/24/14 1721  10/27/14 1542  10/30/14 0459  NA 134*  < >  --   < > 132*  K 3.0*  < > 3.0*  < > 4.6  CL 95*  < >  --   < > 96*  CO2 27  < >  --   < > 28  GLUCOSE 100*  < >  --   < > 107*  BUN 15  < >  --   < > 10  CREATININE 1.16  < >  --   < > 0.81  CALCIUM 9.0  < >  --   < > 8.8*  MG  --   < > 1.8  --   --   AST 19  --   --   --   --   ALT 9*  --   --   --   --   ALKPHOS 79  --   --   --   --   BILITOT 2.1*  --   --   --   --   < > = values in this interval not displayed.  Cardiac Enzymes No results for input(s): TROPONINI in the last 168 hours.  Microbiology Results  No results found for this or any  previous visit.  RADIOLOGY:  Ct Angio Chest Pe W/cm &/or Wo Cm  10/30/2014   CLINICAL DATA:  Tachycardia.  History of mantle cell lymphoma.  EXAM: CT ANGIOGRAPHY CHEST WITH CONTRAST  TECHNIQUE: Multidetector CT imaging of the chest was performed using the standard protocol during bolus administration of intravenous contrast. Multiplanar CT image reconstructions and MIPs were obtained to evaluate the vascular anatomy.  CONTRAST:  41mL OMNIPAQUE IOHEXOL 350 MG/ML SOLN  COMPARISON:  Head CT 09/16/2014 and chest CT 11/12/2013  FINDINGS: No evidence for pulmonary embolism.  There is a right jugular Port-A-Cath with the tip in the SVC. There are coronary artery calcifications. No significant pericardial or pleural fluid.  Compared to the prior PET-CT, there has been marked enlargement of the left axillary lymphadenopathy. Index lymph node in the left axilla measures 3.0 cm in the short axis on sequence 4, image 44 and previously measured 1.4 cm. There are also enlarged lymph nodes throughout the left sub pectoralis region. Second index lymph node in the left axilla measures 3.2 cm on sequence 4, image 30 and previously measured 1.7 cm in the short axis. Again noted are prominent lymph nodes in the right axilla but these have minimally changed compared to the left side. Small mediastinal lymph nodes have minimally changed. Again noted is adenopathy in the upper abdomen which is incompletely imaged.  The trachea and mainstem bronchi are patent. There are new or increased patchy densities at both lung bases. Some of this could represent atelectasis but cannot exclude an infectious or inflammatory process, particularly in the right lower lobe. Again noted are subtle nodular densities in the right middle lobe on sequence 6, image 104. Again noted is a small irregular nodule in the right middle lobe on sequence 6, image 91 measuring roughly 6 mm. Mild paraseptal emphysema at the lung apices with mild apical scarring.  Again  noted are small pleural-based nodular densities in left upper lung, best seen on sequence 6, image 24.  No acute bone abnormality. Again noted is a mild compression deformity involving the T9 vertebral body but this is unchanged from 05/24/2014.  Review of the MIP images confirms the above findings.  IMPRESSION: Negative for pulmonary embolism.  Patchy parenchymal densities in the lower lobes, right side greater the left. Findings could be associated with atelectasis but there may be an infectious or inflammatory process, particularly in the right lower lobe.  Interval enlargement of the lymphadenopathy in the left axilla and left sub pectoralis region. Findings are compatible with progression of the lymphoma.  Few small pulmonary nodules, particularly in the right middle lobe. Recommend attention to these nodules on follow up imaging.   Electronically Signed   By: Markus Daft M.D.   On: 10/30/2014 16:30    Management plans discussed with the patient, family and they are in agreement.  CODE STATUS: Full  TOTAL TIME TAKING CARE OF THIS PATIENT: 45 minutes.    [facility], Ronell Boldin M.D on 10/31/2014 at 12:34 PM  Between 7am to 6pm - Pager - [phone]  After 6pm go to [email or web address] - password [email or web address] [facility]  Office  [phone]  CC: Primary care physician; Dr. Nolon Francis Dr. Ida Rogue Dr. Anthonette Legato Dr. Hollice Espy

## 2014-10-31 NOTE — Progress Notes (Signed)
[facility] Hematology/Oncology Progress Note  Date of admission: 10/24/2014  Hospital day:  10/31/2014  Chief Complaint: Caleb Francis is a 69 y.o. male with progressive mantle cell lymphoma who was admitted with intractable pain associated with left sided hydronephrosis.  Subjective:  Remained hospitalized yesterday for tachycardia.  Patient denied any significant complaint.  Additional work-up performed.  Pain minimal today.  Fever last night.  Denies any change in cough, pleuritic pain, nausea, vomiting, diarrhea, urgency, frequency, dysuria or rash.  Social History: The patient is alone today.  Allergies: No Known Allergies  Scheduled Medications: . allopurinol  300 mg Oral Daily  . amoxicillin-clavulanate  1 tablet Oral Q12H  . bisacodyl  10 mg Rectal Daily  . docusate sodium  100 mg Oral BID  . feeding supplement (ENSURE ENLIVE)  237 mL Oral TID BM  . magnesium oxide  400 mg Oral BID  . metoprolol tartrate  25 mg Oral 3 times per day  . senna  1 tablet Oral Daily  . spironolactone  25 mg Oral Daily   Review of Systems: GENERAL:  Feels ok.  Fever last night.  No sweats or weight loss. PERFORMANCE STATUS (ECOG):  2 HEENT:  No visual changes, runny nose, sore throat, mouth sores or tenderness. Lungs: No shortness of breath or cough.  No hemoptysis. Cardiac:  Tachycardia now on beta blockers.  No chest pain, palpitations, orthopnea, or PND. GI:  No nausea, vomiting, diarrhea, constipation, melena or hematochezia. GU:  Minimal low left sided abdominal pain.  No urgency, frequency, dysuria, or hematuria. Musculoskeletal:  No back pain.  No joint pain.  No muscle tenderness. Extremities:  No pain or swelling. Skin:  No rashes or skin changes. Neuro:  No headache, numbness or weakness, balance or coordination issues. Endocrine:  No diabetes, thyroid issues, hot flashes or night sweats. Psych:  No mood changes, depression or anxiety. Pain: Left lower  quadrant pain (minimal). Review of systems:  All other systems reviewed and found to be negative.  Physical Exam: Blood pressure 103/55, pulse 103, temperature 99 F (37.2 C), temperature source Oral, resp. rate 20, height 5' 9"  (1.753 m), weight 165 lb (74.844 kg), SpO2 99 %.  GENERAL:  Well developed, well nourished, sitting comfortably in the exam room in no acute distress. MENTAL STATUS:  Alert and oriented to person, place and time. HEAD:  Caleb Francis with male pattern baldness.  Normocephalic, atraumatic, face symmetric, no Cushingoid features. EYES:  Brown eyes.  Pupils equal round and reactive to light and accomodation.  No conjunctivitis or scleral icterus. ENT:  Oropharynx clear without lesion.  Tongue normal. Mucous membranes moist.  RESPIRATORY:  Coarse breath sounds right lower lobe on deep inspiration.  Otherwise clear to auscultation without rales, wheezes or rhonchi. CARDIOVASCULAR:  Regular rate and rhythm without murmur, rub or gallop. ABDOMEN:  Abdominal fullness with left sided mass.  No guarding or rebound tenderness.  Soft with active bowel sounds, and no hepatosplenomegaly.  SKIN:  No rashes, ulcers or lesions. EXTREMITIES: Left lower extremity edema (stable). No skin discoloration or tenderness.  No palpable cords. NEUROLOGICAL: Unremarkable. PSYCH:  Appropriate.  Results for orders placed or performed during the hospital encounter of 10/24/14 (from the past 48 hour(s))  Basic metabolic panel     Status: Abnormal   Collection Time: 10/30/14  4:59 AM  Result Value Ref Range   Sodium 132 (L) 135 - 145 mmol/L   Potassium 4.6 3.5 - 5.1 mmol/L   Chloride 96 (  L) 101 - 111 mmol/L   CO2 28 22 - 32 mmol/L   Glucose, Bld 107 (H) 65 - 99 mg/dL   BUN 10 6 - 20 mg/dL   Creatinine, Ser 0.81 0.61 - 1.24 mg/dL   Calcium 8.8 (L) 8.9 - 10.3 mg/dL   GFR calc non Af Amer >60 >60 mL/min   GFR calc Af Amer >60 >60 mL/min    Comment: (NOTE) The eGFR has been calculated using the CKD  EPI equation. This calculation has not been validated in all clinical situations. eGFR's persistently <60 mL/min signify possible Chronic Kidney Disease.    Anion gap 8 5 - 15   Ct Angio Chest Pe W/cm &/or Wo Cm  10/30/2014   CLINICAL DATA:  Tachycardia.  History of mantle cell lymphoma.  EXAM: CT ANGIOGRAPHY CHEST WITH CONTRAST  TECHNIQUE: Multidetector CT imaging of the chest was performed using the standard protocol during bolus administration of intravenous contrast. Multiplanar CT image reconstructions and MIPs were obtained to evaluate the vascular anatomy.  CONTRAST:  41m OMNIPAQUE IOHEXOL 350 MG/ML SOLN  COMPARISON:  Head CT 09/16/2014 and chest CT 11/12/2013  FINDINGS: No evidence for pulmonary embolism.  There is a right jugular Port-A-Cath with the tip in the SVC. There are coronary artery calcifications. No significant pericardial or pleural fluid.  Compared to the prior PET-CT, there has been marked enlargement of the left axillary lymphadenopathy. Index lymph node in the left axilla measures 3.0 cm in the short axis on sequence 4, image 44 and previously measured 1.4 cm. There are also enlarged lymph nodes throughout the left sub pectoralis region. Second index lymph node in the left axilla measures 3.2 cm on sequence 4, image 30 and previously measured 1.7 cm in the short axis. Again noted are prominent lymph nodes in the right axilla but these have minimally changed compared to the left side. Small mediastinal lymph nodes have minimally changed. Again noted is adenopathy in the upper abdomen which is incompletely imaged.  The trachea and mainstem bronchi are patent. There are new or increased patchy densities at both lung bases. Some of this could represent atelectasis but cannot exclude an infectious or inflammatory process, particularly in the right lower lobe. Again noted are subtle nodular densities in the right middle lobe on sequence 6, image 104. Again noted is a small irregular  nodule in the right middle lobe on sequence 6, image 91 measuring roughly 6 mm. Mild paraseptal emphysema at the lung apices with mild apical scarring. Again noted are small pleural-based nodular densities in left upper lung, best seen on sequence 6, image 24.  No acute bone abnormality. Again noted is a mild compression deformity involving the T9 vertebral body but this is unchanged from 05/24/2014.  Review of the MIP images confirms the above findings.  IMPRESSION: Negative for pulmonary embolism.  Patchy parenchymal densities in the lower lobes, right side greater the left. Findings could be associated with atelectasis but there may be an infectious or inflammatory process, particularly in the right lower lobe.  Interval enlargement of the lymphadenopathy in the left axilla and left sub pectoralis region. Findings are compatible with progression of the lymphoma.  Few small pulmonary nodules, particularly in the right middle lobe. Recommend attention to these nodules on follow up imaging.   Electronically Signed   By: AMarkus DaftM.D.   On: 10/30/2014 16:30    Assessment:  CYUNIOR JAINis a 69y.o. male with progressive mantle cell lymphoma admitted with  intractable left sided abdominal pain. Imaging studies reveal left sided hydronephrosis and hydroureter. Patient post operative day #7 status post left internal ureteral stent placement.   Patient's pain controlled on oral pain medications.  Sinus tachycardia now on beta blockers.  Chest CT angiogram last night without evidence of pulmonary embolism, but lower lobe changes (right > left) secondary to atelectasis or pneumonia.  Patient started on Augmentin.  Plan: 1.  Hematology/Oncology- enlarging adenopathy on imaging.  Anticipate RICE chemotherapy next week.  Low grade fever may be due to underlying lymphoma.  On allopurinol for tumor lysis.  Abdominal pain may continue to worsen with rapidly increasing disease. 2   Nephrology- Off supplimental  potassium.  Potassium normal on aldactone. 3.  Pain/Toxicology- Pain well controlled on oral pain medications.  Ditropan for possible bladder spasms. 4.  Cardiology-  Sinus tachycardia likely due to fever and pain.  Now on beta blocker without amlodipine.  Await results of echocardiogram. 5.  Pulmonary-  No evidence of pulmonary embolism.  Possible early RLL pneumonia.  Emperically covered with Augmentin. 6.  Disposition- Discharge patient home today if stable.  Will need home health care.  Lequita Asal, MD  10/31/2014, 1:52 PM

## 2014-10-31 NOTE — Progress Notes (Signed)
Nutrition Follow-up  DOCUMENTATION CODES:     INTERVENTION:   (Meals and Snacks: Honor pt food preferences)  Medical Nutrition Supplement: Continue ENsure BID for additional nutrition  NUTRITION DIAGNOSIS:  Inadequate oral intake related to acute illness as evidenced by meal completion < 25%, improved as patient is eating 100% of meals as recorded per I/O last 24hrs.   GOAL:  Patient will meet greater than or equal to 90% of their needs- currently meeting goal   MONITOR:   (Energy Intake, Electrolyte and Renal Profile)  REASON FOR ASSESSMENT:  Malnutrition Screening Tool    ASSESSMENT: Clinical Update: eating better Typical Food/ Fluid Intake: 100% of meals recorded per I/O last 24 hrs Weight Changes: some weight changes during admission Labs:Electrolyte and Renal Profile:    Recent Labs Lab 10/25/14 0549  10/27/14 0541 10/27/14 1542 10/28/14 0552 10/29/14 0453 10/30/14 0459  BUN 15  < > 8  --  7  --  10  CREATININE 1.07  < > 0.77  --  0.74  --  0.81  NA 134*  < > 135  --  134*  --  132*  K 2.6*  < > 2.9* 3.0* 3.6 4.5 4.6  MG 1.9  --   --  1.8  --   --   --   < > = values in this interval not displayed. Protein Profile:   Recent Labs Lab 10/24/14 1721  ALBUMIN 3.5    Meds: Colace,  Physical Findings: n/a  Height:  Ht Readings from Last 1 Encounters:  10/24/14 5\' 9"  (1.753 m)    Weight:  Wt Readings from Last 1 Encounters:  10/24/14 165 lb (74.844 kg)    Ideal Body Weight:     Wt Readings from Last 10 Encounters:  10/24/14 165 lb (74.844 kg)  10/24/14 169 lb 5 oz (76.8 kg)  10/22/14 168 lb (76.204 kg)    BMI:  Body mass index is 24.36 kg/(m^2).  Skin:  Reviewed, no issues  Diet Order:  Diet regular Room service appropriate?: Yes; Fluid consistency:: Thin Diet - low sodium heart healthy Diet - low sodium heart healthy  EDUCATION NEEDS:  No education needs identified at this time   Intake/Output Summary (Last 24 hours) at  10/31/14 1042 Last data filed at 10/31/14 0900  Gross per 24 hour  Intake    480 ml  Output   1850 ml  Net  -1370 ml    Last BM:  5/18  Roda Shutters, RDN Pager: [phone] Office: [place]

## 2014-10-31 NOTE — Progress Notes (Signed)
Patient ID: Caleb Francis, male   DOB: Jan 16, 1946, 69 y.o.   MRN: [id number]  [facility] [place] NAME: Caleb Francis    MR#:  [id number]  DATE OF BIRTH:  [birth year]  SUBJECTIVE: was planned for D/C but got very tachycardic on working with PT and D/C cancelled for furthur w/up   Review of Systems  Constitutional: Positive for fever. Negative for chills and weight loss.  HENT: Negative for nosebleeds and sore throat.   Eyes: Negative for blurred vision.  Respiratory: Positive for cough. Negative for shortness of breath and wheezing.   Cardiovascular: Positive for palpitations. Negative for chest pain, orthopnea, leg swelling and PND.  Gastrointestinal: Negative for heartburn, nausea, vomiting, abdominal pain, diarrhea and constipation.  Genitourinary: Negative for dysuria and urgency.  Musculoskeletal: Negative for back pain.  Skin: Negative for rash.  Neurological: Negative for dizziness, speech change, focal weakness and headaches.  Endo/Heme/Allergies: Does not bruise/bleed easily.  Psychiatric/Behavioral: Negative for depression.    VITAL SIGNS: Blood pressure 103/55, pulse 113, temperature 99 F (37.2 C), temperature source Oral, resp. rate 20, height 5\' 9"  (1.753 m), weight 74.844 kg (165 lb), SpO2 99 %.  PHYSICAL EXAMINATION:   GENERAL:  69 y.o.-year-old patient lying in the bed with no acute distress.  EYES: Pupils equal, round, reactive to light and accommodation. No scleral icterus. Extraocular muscles intact.  HEENT: Head atraumatic, normocephalic. Oropharynx and nasopharynx clear.  NECK:  Supple, no jugular venous distention. No thyroid enlargement, no tenderness.  LUNGS: Normal breath sounds bilaterally, no wheezing, rales,rhonchi or crepitation. No use of accessory muscles of respiration.  CARDIOVASCULAR: S1, S2 normal. No murmurs, rubs, or gallops.  ABDOMEN: Soft, L sided tenderness, , nondistended. Bowel  sounds present. No organomegaly or mass.  EXTREMITIES: Significant lower extremity, especially thigh edema on the left, no cyanosis, or clubbing.  NEUROLOGIC: Cranial nerves II through XII are intact. Muscle strength 5/5 in all extremities. Sensation intact. Gait not checked.  PSYCHIATRIC: The patient is alert and oriented x 3.  SKIN: No obvious rash, lesion, or ulcer.  L groin  area papin on palaption.  ORDERS/RESULTS REVIEWED:   CBC  Recent Labs Lab 10/25/14 0549 10/27/14 0541  WBC 7.4 7.2  HGB 11.4* 10.5*  HCT 32.6* 31.2*  PLT 81* 86*  MCV 84.2 83.8  MCH 29.3 28.2  MCHC 34.8 33.7  RDW 20.2* 19.9*  LYMPHSABS 1.5 1.1  MONOABS 1.4* 1.9*  EOSABS 0.2 0.2  BASOSABS 0.0 0.1   ------------------------------------------------------------------------------------------------------------------  Chemistries   Recent Labs Lab 10/25/14 0549  10/26/14 0606  10/27/14 0541 10/27/14 1542 10/28/14 0552 10/29/14 0453 10/30/14 0459  NA 134*  --  136  --  135  --  134*  --  132*  K 2.6*  < > 3.0*  < > 2.9* 3.0* 3.6 4.5 4.6  CL 99*  --  102  --  99*  --  97*  --  96*  CO2 24  --  26  --  29  --  28  --  28  GLUCOSE 121*  --  107*  --  92  --  89  --  107*  BUN 15  --  10  --  8  --  7  --  10  CREATININE 1.07  --  0.88  --  0.77  --  0.74  --  0.81  CALCIUM 7.8*  --  8.0*  --  8.1*  --  8.3*  --  8.8*  MG 1.9  --   --   --   --  1.8  --   --   --   < > = values in this interval not displayed. ------------------------------------------------------------------------------------------------------------------ estimated creatinine clearance is 89.7 mL/min (by C-G formula based on Cr of 0.81). ------------------------------------------------------------------------------------------------------------------ No results for input(s): TSH, T4TOTAL, T3FREE, THYROIDAB in the last 72 hours.  Invalid input(s): FREET3  ASSESSMENT AND PLAN:  * sinus tachy: could be due to pneumonia *  pneumonia: seen on CT, also had fever, will start augmentin.  1. Hypokalemia: repleted and resolved 2. Some nausea and mild abdominal distention: resolved, having some hiccups - thorazin helped 3. Hypertension blood pressure appears to be stable continue Norvasc as taking at home. 4. 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  5 . Left lower extremity swelling, due to lymphoma and impaired lymph return, no DVT noted on Doppler ultrasound earlier on admission.   Management plans discussed with the patient, consultants and they are in agreement.   plan was to d/c him today but apparently BP was running low and HR up so discharge cancelled. Will d/c in am DRUG ALLERGIES: No Known Allergies  CODE STATUS:     Code Status Orders        Start     Ordered   10/24/14 1710  Full code   Continuous     10/24/14 1709      TOTAL TIME TAKING CARE OF THIS PATIENT: 35 minutes.   Holding off D/C today. Possible d/c in am depending on clinical condition.   [facility], Cleon Thoma M.D on 10/31/2014 at 7:52 PM  Between 7am to 6pm - Pager - [phone]  After 6pm go to [email or web address] - password [email or web address] [facility]  [facility]  Office  [phone]  CC: Primary care physician; No PCP Per Patient

## 2014-10-31 NOTE — Plan of Care (Signed)
Problem: Discharge Progression Outcomes Goal: Discharge plan in place and appropriate Individualization  Pt prefers to be called Caleb Francis. Hx of arthritis and HTN controlled by home medications Pt is accepting of condition and active in care. Moderate fall precautions Goal: Other Discharge Outcomes/Goals Outcome: Progressing Plan of care progress to goals: BP stable, tachycardia HR 115-123. Metoprolol not given at 0145 due to SBP<110. Tylenol given once for fever with improvement. Denies pain.

## 2014-11-01 NOTE — Plan of Care (Signed)
Problem: Discharge Progression Outcomes Goal: Discharge plan in place and appropriate Individualization  Pt prefers to be called Caleb Francis. Hx of arthritis and HTN controlled by home medications Pt is accepting of condition and active in care. Moderate fall precautions Goal: Other Discharge Outcomes/Goals Outcome: Progressing Plan of care progress to goals: BP/HR stable. C/o LLQ pain and fever at bedtime, oxycodone given with good effect. Possible discharge.

## 2014-11-01 NOTE — Care Management Note (Signed)
Case Management Note  Patient Details  Name: Caleb Francis MRN: [id number] Date of Birth: [birth year]  Subjective/Objective:     Pt discharging to home with advanced following for nursing and PT. Notified advanced of discharge. Pt updated on POC. He had no further concerns or questions.   Primary nurse updated.             Action/Plan:   Expected Discharge Date:       11/01/2014          Expected Discharge Plan:  [place]  In-House Referral:     Discharge planning Services  CM Consult  Post Acute Care Choice:    Choice offered to:  Patient  DME Arranged:    DME Agency:     HH Arranged:  RN, PT [place] Agency:  [place]  Status of Service:  Completed, signed off  Medicare Important Message Given:  Yes Date Medicare IM Given:  10/25/14 Medicare IM give by:  Shelbie Ammons RN MSN Date Additional Medicare IM Given:  10/31/14 Additional Medicare Important Message give by:  Marshell Garfinkel  If discussed at [employer] of Stay Meetings, dates discussed:    Additional Comments:  Jolly Mango, RN 11/01/2014, 8:57 AM

## 2014-11-01 NOTE — Plan of Care (Signed)
MD making rounds. Discharge orders received. Right chest port de accessed. Telemetry discontinued. Discharge orders explained, signed and witnessed. No unanswered questions. Discharged via wheelchair with auxiliary staff. Belongings sent with patient.

## 2014-11-04 ENCOUNTER — Other Ambulatory Visit: Payer: Self-pay | Admitting: Hematology and Oncology

## 2014-11-04 ENCOUNTER — Telehealth: Payer: Self-pay

## 2014-11-04 DIAGNOSIS — C8310 Mantle cell lymphoma, unspecified site: Secondary | ICD-10-CM

## 2014-11-04 NOTE — Telephone Encounter (Signed)
Patient contacted regarding discharge from [facility] on Nov 01, 2014  Patient understands to follow up with provider : Rockey Situ 6/30 at 10:40 [place] Patient understands discharge instructions? yes Patient understands medications and regiment? yes Patient understands to bring all medications to this visit? yes

## 2014-11-05 ENCOUNTER — Ambulatory Visit: Payer: Medicare Other | Admitting: Hematology and Oncology

## 2014-11-05 ENCOUNTER — Inpatient Hospital Stay (HOSPITAL_BASED_OUTPATIENT_CLINIC_OR_DEPARTMENT_OTHER): Payer: Medicare Other | Admitting: Hematology and Oncology

## 2014-11-05 ENCOUNTER — Inpatient Hospital Stay: Payer: Medicare Other

## 2014-11-05 ENCOUNTER — Ambulatory Visit: Payer: Medicare Other

## 2014-11-05 VITALS — BP 101/65 | HR 73 | Temp 97.1°F | Ht 69.0 in | Wt 164.0 lb

## 2014-11-05 DIAGNOSIS — C8310 Mantle cell lymphoma, unspecified site: Secondary | ICD-10-CM

## 2014-11-05 DIAGNOSIS — Z79899 Other long term (current) drug therapy: Secondary | ICD-10-CM | POA: Diagnosis not present

## 2014-11-05 DIAGNOSIS — M069 Rheumatoid arthritis, unspecified: Secondary | ICD-10-CM | POA: Diagnosis not present

## 2014-11-05 DIAGNOSIS — N1330 Unspecified hydronephrosis: Secondary | ICD-10-CM

## 2014-11-05 DIAGNOSIS — I129 Hypertensive chronic kidney disease with stage 1 through stage 4 chronic kidney disease, or unspecified chronic kidney disease: Secondary | ICD-10-CM | POA: Diagnosis not present

## 2014-11-05 DIAGNOSIS — N134 Hydroureter: Secondary | ICD-10-CM | POA: Diagnosis not present

## 2014-11-05 DIAGNOSIS — R1032 Left lower quadrant pain: Secondary | ICD-10-CM

## 2014-11-05 DIAGNOSIS — Z5111 Encounter for antineoplastic chemotherapy: Secondary | ICD-10-CM | POA: Diagnosis not present

## 2014-11-05 DIAGNOSIS — G893 Neoplasm related pain (acute) (chronic): Secondary | ICD-10-CM

## 2014-11-05 DIAGNOSIS — N189 Chronic kidney disease, unspecified: Secondary | ICD-10-CM | POA: Diagnosis not present

## 2014-11-05 DIAGNOSIS — Z87891 Personal history of nicotine dependence: Secondary | ICD-10-CM | POA: Diagnosis not present

## 2014-11-05 LAB — CBC WITH DIFFERENTIAL/PLATELET
Basophils Absolute: 0.2 10*3/uL — ABNORMAL HIGH (ref 0–0.1)
Basophils Relative: 2 %
Eosinophils Absolute: 0.1 10*3/uL (ref 0–0.7)
Eosinophils Relative: 1 %
HCT: 31.6 % — ABNORMAL LOW (ref 40.0–52.0)
Hemoglobin: 10.4 g/dL — ABNORMAL LOW (ref 13.0–18.0)
Lymphocytes Relative: 11 %
Lymphs Abs: 1.2 10*3/uL (ref 1.0–3.6)
MCH: 27.2 pg (ref 26.0–34.0)
MCHC: 32.8 g/dL (ref 32.0–36.0)
MCV: 83.1 fL (ref 80.0–100.0)
Monocytes Absolute: 1.7 10*3/uL — ABNORMAL HIGH (ref 0.2–1.0)
Monocytes Relative: 15 %
Neutro Abs: 8 10*3/uL — ABNORMAL HIGH (ref 1.4–6.5)
Neutrophils Relative %: 71 %
Platelets: 149 10*3/uL — ABNORMAL LOW (ref 150–440)
RBC: 3.81 MIL/uL — ABNORMAL LOW (ref 4.40–5.90)
RDW: 20.4 % — ABNORMAL HIGH (ref 11.5–14.5)
WBC: 11.3 10*3/uL — ABNORMAL HIGH (ref 3.8–10.6)

## 2014-11-05 LAB — COMPREHENSIVE METABOLIC PANEL
ALT: 15 U/L — ABNORMAL LOW (ref 17–63)
AST: 24 U/L (ref 15–41)
Albumin: 2.9 g/dL — ABNORMAL LOW (ref 3.5–5.0)
Alkaline Phosphatase: 142 U/L — ABNORMAL HIGH (ref 38–126)
Anion gap: 14 (ref 5–15)
BUN: 14 mg/dL (ref 6–20)
CO2: 22 mmol/L (ref 22–32)
Calcium: 8.4 mg/dL — ABNORMAL LOW (ref 8.9–10.3)
Chloride: 93 mmol/L — ABNORMAL LOW (ref 101–111)
Creatinine, Ser: 1.2 mg/dL (ref 0.61–1.24)
GFR calc Af Amer: >60 mL/min (ref >60)
GFR calc non Af Amer: >60 mL/min (ref >60)
Glucose, Bld: 133 mg/dL — ABNORMAL HIGH (ref 65–99)
Potassium: 3.5 mmol/L (ref 3.5–5.1)
Sodium: 129 mmol/L — ABNORMAL LOW (ref 135–145)
Total Bilirubin: 1.1 mg/dL (ref 0.3–1.2)
Total Protein: 7.5 g/dL (ref 6.5–8.1)

## 2014-11-05 LAB — LACTATE DEHYDROGENASE: LDH: 587 U/L — ABNORMAL HIGH (ref 98–192)

## 2014-11-05 LAB — URIC ACID: Uric Acid, Serum: 6.2 mg/dL (ref 4.4–7.6)

## 2014-11-05 MED ORDER — HEPARIN SOD (PORK) LOCK FLUSH 100 UNIT/ML IV SOLN
500.0000 [IU] | Freq: Once | INTRAVENOUS | Status: AC
  Administered 2014-11-05: 500 [IU] via INTRAVENOUS

## 2014-11-05 MED ORDER — MORPHINE SULFATE 2 MG/ML IJ SOLN
2.0000 mg | INTRAMUSCULAR | Status: DC | PRN
  Administered 2014-11-05 (×2): 2 mg via INTRAVENOUS

## 2014-11-05 MED ORDER — HEPARIN SOD (PORK) LOCK FLUSH 100 UNIT/ML IV SOLN
INTRAVENOUS | Status: AC
  Filled 2014-11-05: qty 5

## 2014-11-05 MED ORDER — KETOROLAC TROMETHAMINE 15 MG/ML IJ SOLN
15.0000 mg | Freq: Once | INTRAMUSCULAR | Status: AC
  Administered 2014-11-05: 15 mg via INTRAVENOUS
  Filled 2014-11-05: qty 1

## 2014-11-05 NOTE — Patient Instructions (Signed)
Rituximab injection What is this medicine? RITUXIMAB (ri TUX i mab) is a monoclonal antibody. This medicine changes the way the body's immune system works. It is used commonly to treat non-Hodgkin's lymphoma and other conditions. In cancer cells, this drug targets a specific protein within cancer cells and stops the cancer cells from growing. It is also used to treat rhuematoid arthritis (RA). In RA, this medicine slow the inflammatory process and help reduce joint pain and swelling. This medicine is often used with other cancer or arthritis medications. This medicine may be used for other purposes; ask your health care provider or pharmacist if you have questions. COMMON BRAND NAME(S): Rituxan What should I tell my health care provider before I take this medicine? They need to know if you have any of these conditions: -blood disorders -heart disease -history of hepatitis B -infection (especially a virus infection such as chickenpox, cold sores, or herpes) -irregular heartbeat -kidney disease -lung or breathing disease, like asthma -lupus -an unusual or allergic reaction to rituximab, mouse proteins, other medicines, foods, dyes, or preservatives -pregnant or trying to get pregnant -breast-feeding How should I use this medicine? This medicine is for infusion into a vein. It is administered in a hospital or clinic by a specially trained health care professional. A special MedGuide will be given to you by the pharmacist with each prescription and refill. Be sure to read this information carefully each time. Talk to your pediatrician regarding the use of this medicine in children. This medicine is not approved for use in children. Overdosage: If you think you have taken too much of this medicine contact a poison control center or emergency room at once. NOTE: This medicine is only for you. Do not share this medicine with others. What if I miss a dose? It is important not to miss a dose. Call  your doctor or health care professional if you are unable to keep an appointment. What may interact with this medicine? -cisplatin -medicines for blood pressure -some other medicines for arthritis -vaccines This list may not describe all possible interactions. Give your health care provider a list of all the medicines, herbs, non-prescription drugs, or dietary supplements you use. Also tell them if you smoke, drink alcohol, or use illegal drugs. Some items may interact with your medicine. What should I watch for while using this medicine? Report any side effects that you notice during your treatment right away, such as changes in your breathing, fever, chills, dizziness or lightheadedness. These effects are more common with the first dose. Visit your prescriber or health care professional for checks on your progress. You will need to have regular blood work. Report any other side effects. The side effects of this medicine can continue after you finish your treatment. Continue your course of treatment even though you feel ill unless your doctor tells you to stop. Call your doctor or health care professional for advice if you get a fever, chills or sore throat, or other symptoms of a cold or flu. Do not treat yourself. This drug decreases your body's ability to fight infections. Try to avoid being around people who are sick. This medicine may increase your risk to bruise or bleed. Call your doctor or health care professional if you notice any unusual bleeding. Be careful brushing and flossing your teeth or using a toothpick because you may get an infection or bleed more easily. If you have any dental work done, tell your dentist you are receiving this medicine. Avoid taking products   that contain aspirin, acetaminophen, ibuprofen, naproxen, or ketoprofen unless instructed by your doctor. These medicines may hide a fever. Do not become pregnant while taking this medicine. Women should inform their doctor  if they wish to become pregnant or think they might be pregnant. There is a potential for serious side effects to an unborn child. Talk to your health care professional or pharmacist for more information. Do not breast-feed an infant while taking this medicine. What side effects may I notice from receiving this medicine? Side effects that you should report to your doctor or health care professional as soon as possible: -allergic reactions like skin rash, itching or hives, swelling of the face, lips, or tongue -low blood counts - this medicine may decrease the number of white blood cells, red blood cells and platelets. You may be at increased risk for infections and bleeding. -signs of infection - fever or chills, cough, sore throat, pain or difficulty passing urine -signs of decreased platelets or bleeding - bruising, pinpoint red spots on the skin, black, tarry stools, blood in the urine -signs of decreased red blood cells - unusually weak or tired, fainting spells, lightheadedness -breathing problems -confused, not responsive -chest pain -fast, irregular heartbeat -feeling faint or lightheaded, falls -mouth sores -redness, blistering, peeling or loosening of the skin, including inside the mouth -stomach pain -swelling of the ankles, feet, or hands -trouble passing urine or change in the amount of urine Side effects that usually do not require medical attention (report to your doctor or other health care professional if they continue or are bothersome): -anxiety -headache -loss of appetite -muscle aches -nausea -night sweats This list may not describe all possible side effects. Call your doctor for medical advice about side effects. You may report side effects to FDA at 1-800-FDA-1088. Where should I keep my medicine? This drug is given in a hospital or clinic and will not be stored at home. NOTE: This sheet is a summary. It may not cover all possible information. If you have questions  about this medicine, talk to your doctor, pharmacist, or health care provider.  2015, [employer] Standard. (2008-01-29 14:04:59) Ifosfamide injection What is this medicine? IFOSFAMIDE (eye FOS fa mide) is a chemotherapy drug. It slows the growth of cancer cells. This medicine is used to treat testicular cancer. This medicine may be used for other purposes; ask your health care provider or pharmacist if you have questions. COMMON BRAND NAME(S): Ifex, Ifex/Mesna What should I tell my health care provider before I take this medicine? They need to know if you have any of these conditions: -bladder problems -blood disorders -dehydration -infection (especially a virus infection such as chickenpox, cold sores, or herpes) -kidney disease -liver disease -recent or ongoing radiation therapy -an unusual or allergic reaction to ifosfamide, other chemotherapy, other medicines, foods, dyes, or preservatives -pregnant or trying to get pregnant -breast-feeding How should I use this medicine? This drug is given as an infusion into a vein. It is administered in a hospital or clinic by a specially trained health care professional. Talk to your pediatrician regarding the use of this medicine in children. Special care may be needed. Overdosage: If you think you have taken too much of this medicine contact a poison control center or emergency room at once. NOTE: This medicine is only for you. Do not share this medicine with others. What if I miss a dose? It is important not to miss your dose. Call your doctor or health care professional if you are unable  to keep an appointment. What may interact with this medicine? Do not take this medicine with any of the following medications: -nalidixic acid This medicine may also interact with the following medications: -medicines to increase blood counts like filgrastim, pegfilgrastim, sargramostim -St. John's Wort -vaccines Talk to your doctor or health care  professional before taking any of these medicines: -acetaminophen -aspirin -ibuprofen -ketoprofen -naproxen This list may not describe all possible interactions. Give your health care provider a list of all the medicines, herbs, non-prescription drugs, or dietary supplements you use. Also tell them if you smoke, drink alcohol, or use illegal drugs. Some items may interact with your medicine. What should I watch for while using this medicine? Visit your doctor for checks on your progress. This drug may make you feel generally unwell. This is not uncommon, as chemotherapy can affect healthy cells as well as cancer cells. Report any side effects. Continue your course of treatment even though you feel ill unless your doctor tells you to stop. Drink water or other fluids as directed. Urinate often, even at night. In some cases, you may be given additional medicines to help with side effects. Follow all directions for their use. Call your doctor or health care professional for advice if you get a fever, chills or sore throat, or other symptoms of a cold or flu. Do not treat yourself. This drug decreases your body's ability to fight infections. Try to avoid being around people who are sick. This medicine may increase your risk to bruise or bleed. Call your doctor or health care professional if you notice any unusual bleeding. Be careful brushing and flossing your teeth or using a toothpick because you may get an infection or bleed more easily. If you have any dental work done, tell your dentist you are receiving this medicine. Avoid taking products that contain aspirin, acetaminophen, ibuprofen, naproxen, or ketoprofen unless instructed by your doctor. These medicines may hide a fever. Do not become pregnant while taking this medicine. Women should inform their doctor if they wish to become pregnant or think they might be pregnant. There is a potential for serious side effects to an unborn child. Talk to  your health care professional or pharmacist for more information. Do not breast-feed an infant while taking this medicine. What side effects may I notice from receiving this medicine? Side effects that you should report to your doctor or health care professional as soon as possible: -allergic reactions like skin rash, itching or hives, swelling of the face, lips, or tongue -low blood counts - this medicine may decrease the number of white blood cells, red blood cells and platelets. You may be at increased risk for infections and bleeding. -signs of infection - fever or chills, cough, sore throat, pain or difficulty passing urine -signs of decreased platelets or bleeding - bruising, pinpoint red spots on the skin, black, tarry stools, blood in the urine -signs of decreased red blood cells - unusually weak or tired, fainting spells, lightheadedness -agitation -breathing problems -confusion -dark urine -hallucinations -mouth sores -pain, swelling, redness at site where injected -seizures -trouble passing urine or change in the amount of urine -yellowing of the eyes or skin Side effects that usually do not require medical attention (report to your doctor or health care professional if they continue or are bothersome): -diarrhea -hair loss -loss of appetite -nausea, vomiting This list may not describe all possible side effects. Call your doctor for medical advice about side effects. You may report side effects to  FDA at 1-800-FDA-1088. Where should I keep my medicine? This drug is given in a hospital or clinic and will not be stored at home. NOTE: This sheet is a summary. It may not cover all possible information. If you have questions about this medicine, talk to your doctor, pharmacist, or health care provider.  2015, [employer] Standard. (2007-10-17 16:23:05) Carboplatin injection What is this medicine? CARBOPLATIN (KAR boe pla tin) is a chemotherapy drug. It targets fast dividing  cells, like cancer cells, and causes these cells to die. This medicine is used to treat ovarian cancer and many other cancers. This medicine may be used for other purposes; ask your health care provider or pharmacist if you have questions. COMMON BRAND NAME(S): Paraplatin What should I tell my health care provider before I take this medicine? They need to know if you have any of these conditions: -blood disorders -hearing problems -kidney disease -recent or ongoing radiation therapy -an unusual or allergic reaction to carboplatin, cisplatin, other chemotherapy, other medicines, foods, dyes, or preservatives -pregnant or trying to get pregnant -breast-feeding How should I use this medicine? This drug is usually given as an infusion into a vein. It is administered in a hospital or clinic by a specially trained health care professional. Talk to your pediatrician regarding the use of this medicine in children. Special care may be needed. Overdosage: If you think you have taken too much of this medicine contact a poison control center or emergency room at once. NOTE: This medicine is only for you. Do not share this medicine with others. What if I miss a dose? It is important not to miss a dose. Call your doctor or health care professional if you are unable to keep an appointment. What may interact with this medicine? -medicines for seizures -medicines to increase blood counts like filgrastim, pegfilgrastim, sargramostim -some antibiotics like amikacin, gentamicin, neomycin, streptomycin, tobramycin -vaccines Talk to your doctor or health care professional before taking any of these medicines: -acetaminophen -aspirin -ibuprofen -ketoprofen -naproxen This list may not describe all possible interactions. Give your health care provider a list of all the medicines, herbs, non-prescription drugs, or dietary supplements you use. Also tell them if you smoke, drink alcohol, or use illegal drugs. Some  items may interact with your medicine. What should I watch for while using this medicine? Your condition will be monitored carefully while you are receiving this medicine. You will need important blood work done while you are taking this medicine. This drug may make you feel generally unwell. This is not uncommon, as chemotherapy can affect healthy cells as well as cancer cells. Report any side effects. Continue your course of treatment even though you feel ill unless your doctor tells you to stop. In some cases, you may be given additional medicines to help with side effects. Follow all directions for their use. Call your doctor or health care professional for advice if you get a fever, chills or sore throat, or other symptoms of a cold or flu. Do not treat yourself. This drug decreases your body's ability to fight infections. Try to avoid being around people who are sick. This medicine may increase your risk to bruise or bleed. Call your doctor or health care professional if you notice any unusual bleeding. Be careful brushing and flossing your teeth or using a toothpick because you may get an infection or bleed more easily. If you have any dental work done, tell your dentist you are receiving this medicine. Avoid taking products that contain  aspirin, acetaminophen, ibuprofen, naproxen, or ketoprofen unless instructed by your doctor. These medicines may hide a fever. Do not become pregnant while taking this medicine. Women should inform their doctor if they wish to become pregnant or think they might be pregnant. There is a potential for serious side effects to an unborn child. Talk to your health care professional or pharmacist for more information. Do not breast-feed an infant while taking this medicine. What side effects may I notice from receiving this medicine? Side effects that you should report to your doctor or health care professional as soon as possible: -allergic reactions like skin rash,  itching or hives, swelling of the face, lips, or tongue -signs of infection - fever or chills, cough, sore throat, pain or difficulty passing urine -signs of decreased platelets or bleeding - bruising, pinpoint red spots on the skin, black, tarry stools, nosebleeds -signs of decreased red blood cells - unusually weak or tired, fainting spells, lightheadedness -breathing problems -changes in hearing -changes in vision -chest pain -high blood pressure -low blood counts - This drug may decrease the number of white blood cells, red blood cells and platelets. You may be at increased risk for infections and bleeding. -nausea and vomiting -pain, swelling, redness or irritation at the injection site -pain, tingling, numbness in the hands or feet -problems with balance, talking, walking -trouble passing urine or change in the amount of urine Side effects that usually do not require medical attention (report to your doctor or health care professional if they continue or are bothersome): -hair loss -loss of appetite -metallic taste in the mouth or changes in taste This list may not describe all possible side effects. Call your doctor for medical advice about side effects. You may report side effects to FDA at 1-800-FDA-1088. Where should I keep my medicine? This drug is given in a hospital or clinic and will not be stored at home. NOTE: This sheet is a summary. It may not cover all possible information. If you have questions about this medicine, talk to your doctor, pharmacist, or health care provider.  2015, [employer] Standard. (2007-09-05 14:38:05) Etoposide, VP-16 injection What is this medicine? ETOPOSIDE, VP-16 (e toe POE side) is a chemotherapy drug. It is used to treat testicular cancer, lung cancer, and other cancers. This medicine may be used for other purposes; ask your health care provider or pharmacist if you have questions. COMMON BRAND NAME(S): Etopophos, Toposar, VePesid What  should I tell my health care provider before I take this medicine? They need to know if you have any of these conditions: -infection -kidney disease -low blood counts, like low white cell, platelet, or red cell counts -an unusual or allergic reaction to etoposide, other chemotherapeutic agents, other medicines, foods, dyes, or preservatives -pregnant or trying to get pregnant -breast-feeding How should I use this medicine? This medicine is for infusion into a vein. It is administered in a hospital or clinic by a specially trained health care professional. Talk to your pediatrician regarding the use of this medicine in children. Special care may be needed. Overdosage: If you think you have taken too much of this medicine contact a poison control center or emergency room at once. NOTE: This medicine is only for you. Do not share this medicine with others. What if I miss a dose? It is important not to miss your dose. Call your doctor or health care professional if you are unable to keep an appointment. What may interact with this medicine? -cyclosporine -medicines to increase  blood counts like filgrastim, pegfilgrastim, sargramostim -vaccines This list may not describe all possible interactions. Give your health care provider a list of all the medicines, herbs, non-prescription drugs, or dietary supplements you use. Also tell them if you smoke, drink alcohol, or use illegal drugs. Some items may interact with your medicine. What should I watch for while using this medicine? Visit your doctor for checks on your progress. This drug may make you feel generally unwell. This is not uncommon, as chemotherapy can affect healthy cells as well as cancer cells. Report any side effects. Continue your course of treatment even though you feel ill unless your doctor tells you to stop. In some cases, you may be given additional medicines to help with side effects. Follow all directions for their use. Call your  doctor or health care professional for advice if you get a fever, chills or sore throat, or other symptoms of a cold or flu. Do not treat yourself. This drug decreases your body's ability to fight infections. Try to avoid being around people who are sick. This medicine may increase your risk to bruise or bleed. Call your doctor or health care professional if you notice any unusual bleeding. Be careful brushing and flossing your teeth or using a toothpick because you may get an infection or bleed more easily. If you have any dental work done, tell your dentist you are receiving this medicine. Avoid taking products that contain aspirin, acetaminophen, ibuprofen, naproxen, or ketoprofen unless instructed by your doctor. These medicines may hide a fever. Do not become pregnant while taking this medicine. Women should inform their doctor if they wish to become pregnant or think they might be pregnant. There is a potential for serious side effects to an unborn child. Talk to your health care professional or pharmacist for more information. Do not breast-feed an infant while taking this medicine. What side effects may I notice from receiving this medicine? Side effects that you should report to your doctor or health care professional as soon as possible: -allergic reactions like skin rash, itching or hives, swelling of the face, lips, or tongue -low blood counts - this medicine may decrease the number of white blood cells, red blood cells and platelets. You may be at increased risk for infections and bleeding. -signs of infection - fever or chills, cough, sore throat, pain or difficulty passing urine -signs of decreased platelets or bleeding - bruising, pinpoint red spots on the skin, black, tarry stools, blood in the urine -signs of decreased red blood cells - unusually weak or tired, fainting spells, lightheadedness -breathing problems -changes in vision -mouth or throat sores or ulcers -pain, redness,  swelling or irritation at the injection site -pain, tingling, numbness in the hands or feet -redness, blistering, peeling or loosening of the skin, including inside the mouth -seizures -vomiting Side effects that usually do not require medical attention (report to your doctor or health care professional if they continue or are bothersome): -diarrhea -hair loss -loss of appetite -nausea -stomach pain This list may not describe all possible side effects. Call your doctor for medical advice about side effects. You may report side effects to FDA at 1-800-FDA-1088. Where should I keep my medicine? This drug is given in a hospital or clinic and will not be stored at home. NOTE: This sheet is a summary. It may not cover all possible information. If you have questions about this medicine, talk to your doctor, pharmacist, or health care provider.  2015, [employer] Standard. (2007-10-02  17:24:12) Mesna injection What is this medicine? MESNA (MES na) is used to prevent bleeding from the bladder during treatment with ifosfamide. This medicine does not reduce the chance of getting other side effects of cancer chemotherapy. This medicine may be used for other purposes; ask your health care provider or pharmacist if you have questions. COMMON BRAND NAME(S): Mesnex What should I tell my health care provider before I take this medicine? They need to know if you have any of these conditions: -autoimmune disease like lupus, nephritis, or rheumatoid arthritis -an unusual or allergic reaction to mesna, benzyl alcohol, sulfur medicines, other medicines, foods, dyes, or preservatives -pregnant or trying to get pregnant -breast-feeding How should I use this medicine? This medicine is for infusion into a vein. It is given by a health care professional in a hospital or clinic setting. Talk to your pediatrician regarding the use of this medicine in children. Special care may be needed. Overdosage: If you  think you have taken too much of this medicine contact a poison control center or emergency room at once. NOTE: This medicine is only for you. Do not share this medicine with others. What if I miss a dose? This does not apply. What may interact with this medicine? Interactions are not expected. This list may not describe all possible interactions. Give your health care provider a list of all the medicines, herbs, non-prescription drugs, or dietary supplements you use. Also tell them if you smoke, drink alcohol, or use illegal drugs. Some items may interact with your medicine. What should I watch for while using this medicine? Your doctor will follow your condition closely while you are taking this medicine. Tell your doctor right away if you see that your urine has turned a pink or red color. It is important to drink at least a quart (4 cups) of fluids each day that you take this medicine. What side effects may I notice from receiving this medicine? Side effects that you should report to your doctor or health care professional as soon as possible: -allergic reactions like skin rash, itching or hives, swelling of the face, lips, or tongue -breathing problems -blood in your urine or pink to red colored urine -fever, chills, or sore throat -flushing or redness to skin -mouth sores -pain or redness at site where injected -swelling of ankles or feet -vomiting Side effects that usually do not require medical attention (report to your doctor or health care professional if they continue or are bothersome): -aches and pains -bad taste in mouth -diarrhea -dizziness -hair loss -headache -nausea This list may not describe all possible side effects. Call your doctor for medical advice about side effects. You may report side effects to FDA at 1-800-FDA-1088. Where should I keep my medicine? This drug is given in a hospital or clinic and will not be stored at home. NOTE: This sheet is a summary. It  may not cover all possible information. If you have questions about this medicine, talk to your doctor, pharmacist, or health care provider.  2015, [employer]. (2007-12-18 16:43:56)

## 2014-11-05 NOTE — Progress Notes (Signed)
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

## 2014-11-06 ENCOUNTER — Ambulatory Visit: Payer: Medicare Other

## 2014-11-06 ENCOUNTER — Inpatient Hospital Stay: Payer: Medicare Other

## 2014-11-06 ENCOUNTER — Telehealth: Payer: Self-pay | Admitting: Pharmacist

## 2014-11-06 VITALS — BP 113/63 | HR 114 | Temp 97.5°F | Resp 20

## 2014-11-06 DIAGNOSIS — C8310 Mantle cell lymphoma, unspecified site: Secondary | ICD-10-CM

## 2014-11-06 DIAGNOSIS — Z5111 Encounter for antineoplastic chemotherapy: Secondary | ICD-10-CM | POA: Diagnosis not present

## 2014-11-06 LAB — MISC LABCORP TEST (SEND OUT): Labcorp test code: 6510

## 2014-11-06 LAB — HEPATITIS B CORE ANTIBODY, TOTAL: Hep B Core Total Ab: NEGATIVE

## 2014-11-06 MED ORDER — SODIUM CHLORIDE 0.9 % IV SOLN
400.0000 mg | Freq: Once | INTRAVENOUS | Status: AC
  Administered 2014-11-06: 400 mg via INTRAVENOUS
  Filled 2014-11-06: qty 40

## 2014-11-06 MED ORDER — SODIUM CHLORIDE 0.9 % IV SOLN
Freq: Once | INTRAVENOUS | Status: AC
  Administered 2014-11-06: 16 mg via INTRAVENOUS
  Filled 2014-11-06: qty 8

## 2014-11-06 MED ORDER — ACETAMINOPHEN 325 MG PO TABS
650.0000 mg | ORAL_TABLET | Freq: Once | ORAL | Status: AC
  Administered 2014-11-06: 650 mg via ORAL
  Filled 2014-11-06: qty 2

## 2014-11-06 MED ORDER — SODIUM CHLORIDE 0.9 % IJ SOLN
10.0000 mL | INTRAMUSCULAR | Status: DC | PRN
  Administered 2014-11-06: 10 mL
  Filled 2014-11-06: qty 10

## 2014-11-06 MED ORDER — SODIUM CHLORIDE 0.9 % IV SOLN
100.0000 mg/m2 | Freq: Once | INTRAVENOUS | Status: AC
  Administered 2014-11-06: 190 mg via INTRAVENOUS
  Filled 2014-11-06: qty 9.5

## 2014-11-06 MED ORDER — SODIUM CHLORIDE 0.9 % IV SOLN
Freq: Once | INTRAVENOUS | Status: AC
  Administered 2014-11-06: 10:00:00 via INTRAVENOUS
  Filled 2014-11-06: qty 1000

## 2014-11-06 MED ORDER — DIPHENHYDRAMINE HCL 50 MG/ML IJ SOLN
25.0000 mg | Freq: Once | INTRAMUSCULAR | Status: AC
  Administered 2014-11-06: 25 mg via INTRAVENOUS
  Filled 2014-11-06: qty 1

## 2014-11-06 MED ORDER — SODIUM CHLORIDE 0.9 % IV SOLN
INTRAVENOUS | Status: DC
  Administered 2014-11-06: 20 mL/h via INTRAVENOUS
  Filled 2014-11-06: qty 250

## 2014-11-06 MED ORDER — HEPARIN SOD (PORK) LOCK FLUSH 100 UNIT/ML IV SOLN
500.0000 [IU] | Freq: Once | INTRAVENOUS | Status: AC | PRN
  Filled 2014-11-06: qty 5

## 2014-11-06 NOTE — Telephone Encounter (Signed)
Spoke with Dr. Mike Gip. 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 Patient will be given extra fluid and rituxan dose will be divided between 2 days. 400mg  given today and 300mg  will be ordered for tomorrow.

## 2014-11-06 NOTE — Patient Instructions (Signed)
Rituximab injection What is this medicine? RITUXIMAB (ri TUX i mab) is a monoclonal antibody. This medicine changes the way the body's immune system works. It is used commonly to treat non-Hodgkin's lymphoma and other conditions. In cancer cells, this drug targets a specific protein within cancer cells and stops the cancer cells from growing. It is also used to treat rhuematoid arthritis (RA). In RA, this medicine slow the inflammatory process and help reduce joint pain and swelling. This medicine is often used with other cancer or arthritis medications. This medicine may be used for other purposes; ask your health care provider or pharmacist if you have questions. COMMON BRAND NAME(S): Rituxan What should I tell my health care provider before I take this medicine? They need to know if you have any of these conditions: -blood disorders -heart disease -history of hepatitis B -infection (especially a virus infection such as chickenpox, cold sores, or herpes) -irregular heartbeat -kidney disease -lung or breathing disease, like asthma -lupus -an unusual or allergic reaction to rituximab, mouse proteins, other medicines, foods, dyes, or preservatives -pregnant or trying to get pregnant -breast-feeding How should I use this medicine? This medicine is for infusion into a vein. It is administered in a hospital or clinic by a specially trained health care professional. A special MedGuide will be given to you by the pharmacist with each prescription and refill. Be sure to read this information carefully each time. Talk to your pediatrician regarding the use of this medicine in children. This medicine is not approved for use in children. Overdosage: If you think you have taken too much of this medicine contact a poison control center or emergency room at once. NOTE: This medicine is only for you. Do not share this medicine with others. What if I miss a dose? It is important not to miss a dose. Call  your doctor or health care professional if you are unable to keep an appointment. What may interact with this medicine? -cisplatin -medicines for blood pressure -some other medicines for arthritis -vaccines This list may not describe all possible interactions. Give your health care provider a list of all the medicines, herbs, non-prescription drugs, or dietary supplements you use. Also tell them if you smoke, drink alcohol, or use illegal drugs. Some items may interact with your medicine. What should I watch for while using this medicine? Report any side effects that you notice during your treatment right away, such as changes in your breathing, fever, chills, dizziness or lightheadedness. These effects are more common with the first dose. Visit your prescriber or health care professional for checks on your progress. You will need to have regular blood work. Report any other side effects. The side effects of this medicine can continue after you finish your treatment. Continue your course of treatment even though you feel ill unless your doctor tells you to stop. Call your doctor or health care professional for advice if you get a fever, chills or sore throat, or other symptoms of a cold or flu. Do not treat yourself. This drug decreases your body's ability to fight infections. Try to avoid being around people who are sick. This medicine may increase your risk to bruise or bleed. Call your doctor or health care professional if you notice any unusual bleeding. Be careful brushing and flossing your teeth or using a toothpick because you may get an infection or bleed more easily. If you have any dental work done, tell your dentist you are receiving this medicine. Avoid taking products   that contain aspirin, acetaminophen, ibuprofen, naproxen, or ketoprofen unless instructed by your doctor. These medicines may hide a fever. Do not become pregnant while taking this medicine. Women should inform their doctor  if they wish to become pregnant or think they might be pregnant. There is a potential for serious side effects to an unborn child. Talk to your health care professional or pharmacist for more information. Do not breast-feed an infant while taking this medicine. What side effects may I notice from receiving this medicine? Side effects that you should report to your doctor or health care professional as soon as possible: -allergic reactions like skin rash, itching or hives, swelling of the face, lips, or tongue -low blood counts - this medicine may decrease the number of white blood cells, red blood cells and platelets. You may be at increased risk for infections and bleeding. -signs of infection - fever or chills, cough, sore throat, pain or difficulty passing urine -signs of decreased platelets or bleeding - bruising, pinpoint red spots on the skin, black, tarry stools, blood in the urine -signs of decreased red blood cells - unusually weak or tired, fainting spells, lightheadedness -breathing problems -confused, not responsive -chest pain -fast, irregular heartbeat -feeling faint or lightheaded, falls -mouth sores -redness, blistering, peeling or loosening of the skin, including inside the mouth -stomach pain -swelling of the ankles, feet, or hands -trouble passing urine or change in the amount of urine Side effects that usually do not require medical attention (report to your doctor or other health care professional if they continue or are bothersome): -anxiety -headache -loss of appetite -muscle aches -nausea -night sweats This list may not describe all possible side effects. Call your doctor for medical advice about side effects. You may report side effects to FDA at 1-800-FDA-1088. Where should I keep my medicine? This drug is given in a hospital or clinic and will not be stored at home. NOTE: This sheet is a summary. It may not cover all possible information. If you have questions  about this medicine, talk to your doctor, pharmacist, or health care provider.  2015, [employer]. (2008-01-29 14:04:59) Ifosfamide injection What is this medicine? IFOSFAMIDE (eye FOS fa mide) is a chemotherapy drug. It slows the growth of cancer cells. This medicine is used to treat testicular cancer. This medicine may be used for other purposes; ask your health care provider or pharmacist if you have questions. COMMON BRAND NAME(S): Ifex, Ifex/Mesna What should I tell my health care provider before I take this medicine? They need to know if you have any of these conditions: -bladder problems -blood disorders -dehydration -infection (especially a virus infection such as chickenpox, cold sores, or herpes) -kidney disease -liver disease -recent or ongoing radiation therapy -an unusual or allergic reaction to ifosfamide, other chemotherapy, other medicines, foods, dyes, or preservatives -pregnant or trying to get pregnant -breast-feeding How should I use this medicine? This drug is given as an infusion into a vein. It is administered in a hospital or clinic by a specially trained health care professional. Talk to your pediatrician regarding the use of this medicine in children. Special care may be needed. Overdosage: If you think you have taken too much of this medicine contact a poison control center or emergency room at once. NOTE: This medicine is only for you. Do not share this medicine with others. What if I miss a dose? It is important not to miss your dose. Call your doctor or health care professional if you are unable  to keep an appointment. What may interact with this medicine? Do not take this medicine with any of the following medications: -nalidixic acid This medicine may also interact with the following medications: -medicines to increase blood counts like filgrastim, pegfilgrastim, sargramostim -St. John's Wort -vaccines Talk to your doctor or health care  professional before taking any of these medicines: -acetaminophen -aspirin -ibuprofen -ketoprofen -naproxen This list may not describe all possible interactions. Give your health care provider a list of all the medicines, herbs, non-prescription drugs, or dietary supplements you use. Also tell them if you smoke, drink alcohol, or use illegal drugs. Some items may interact with your medicine. What should I watch for while using this medicine? Visit your doctor for checks on your progress. This drug may make you feel generally unwell. This is not uncommon, as chemotherapy can affect healthy cells as well as cancer cells. Report any side effects. Continue your course of treatment even though you feel ill unless your doctor tells you to stop. Drink water or other fluids as directed. Urinate often, even at night. In some cases, you may be given additional medicines to help with side effects. Follow all directions for their use. Call your doctor or health care professional for advice if you get a fever, chills or sore throat, or other symptoms of a cold or flu. Do not treat yourself. This drug decreases your body's ability to fight infections. Try to avoid being around people who are sick. This medicine may increase your risk to bruise or bleed. Call your doctor or health care professional if you notice any unusual bleeding. Be careful brushing and flossing your teeth or using a toothpick because you may get an infection or bleed more easily. If you have any dental work done, tell your dentist you are receiving this medicine. Avoid taking products that contain aspirin, acetaminophen, ibuprofen, naproxen, or ketoprofen unless instructed by your doctor. These medicines may hide a fever. Do not become pregnant while taking this medicine. Women should inform their doctor if they wish to become pregnant or think they might be pregnant. There is a potential for serious side effects to an unborn child. Talk to  your health care professional or pharmacist for more information. Do not breast-feed an infant while taking this medicine. What side effects may I notice from receiving this medicine? Side effects that you should report to your doctor or health care professional as soon as possible: -allergic reactions like skin rash, itching or hives, swelling of the face, lips, or tongue -low blood counts - this medicine may decrease the number of white blood cells, red blood cells and platelets. You may be at increased risk for infections and bleeding. -signs of infection - fever or chills, cough, sore throat, pain or difficulty passing urine -signs of decreased platelets or bleeding - bruising, pinpoint red spots on the skin, black, tarry stools, blood in the urine -signs of decreased red blood cells - unusually weak or tired, fainting spells, lightheadedness -agitation -breathing problems -confusion -dark urine -hallucinations -mouth sores -pain, swelling, redness at site where injected -seizures -trouble passing urine or change in the amount of urine -yellowing of the eyes or skin Side effects that usually do not require medical attention (report to your doctor or health care professional if they continue or are bothersome): -diarrhea -hair loss -loss of appetite -nausea, vomiting This list may not describe all possible side effects. Call your doctor for medical advice about side effects. You may report side effects to  FDA at 1-800-FDA-1088. Where should I keep my medicine? This drug is given in a hospital or clinic and will not be stored at home. NOTE: This sheet is a summary. It may not cover all possible information. If you have questions about this medicine, talk to your doctor, pharmacist, or health care provider.  2015, [employer] Standard. (2007-10-17 16:23:05) Mesna injection What is this medicine? MESNA (MES na) is used to prevent bleeding from the bladder during treatment with  ifosfamide. This medicine does not reduce the chance of getting other side effects of cancer chemotherapy. This medicine may be used for other purposes; ask your health care provider or pharmacist if you have questions. COMMON BRAND NAME(S): Mesnex What should I tell my health care provider before I take this medicine? They need to know if you have any of these conditions: -autoimmune disease like lupus, nephritis, or rheumatoid arthritis -an unusual or allergic reaction to mesna, benzyl alcohol, sulfur medicines, other medicines, foods, dyes, or preservatives -pregnant or trying to get pregnant -breast-feeding How should I use this medicine? This medicine is for infusion into a vein. It is given by a health care professional in a hospital or clinic setting. Talk to your pediatrician regarding the use of this medicine in children. Special care may be needed. Overdosage: If you think you have taken too much of this medicine contact a poison control center or emergency room at once. NOTE: This medicine is only for you. Do not share this medicine with others. What if I miss a dose? This does not apply. What may interact with this medicine? Interactions are not expected. This list may not describe all possible interactions. Give your health care provider a list of all the medicines, herbs, non-prescription drugs, or dietary supplements you use. Also tell them if you smoke, drink alcohol, or use illegal drugs. Some items may interact with your medicine. What should I watch for while using this medicine? Your doctor will follow your condition closely while you are taking this medicine. Tell your doctor right away if you see that your urine has turned a pink or red color. It is important to drink at least a quart (4 cups) of fluids each day that you take this medicine. What side effects may I notice from receiving this medicine? Side effects that you should report to your doctor or health care  professional as soon as possible: -allergic reactions like skin rash, itching or hives, swelling of the face, lips, or tongue -breathing problems -blood in your urine or pink to red colored urine -fever, chills, or sore throat -flushing or redness to skin -mouth sores -pain or redness at site where injected -swelling of ankles or feet -vomiting Side effects that usually do not require medical attention (report to your doctor or health care professional if they continue or are bothersome): -aches and pains -bad taste in mouth -diarrhea -dizziness -hair loss -headache -nausea This list may not describe all possible side effects. Call your doctor for medical advice about side effects. You may report side effects to FDA at 1-800-FDA-1088. Where should I keep my medicine? This drug is given in a hospital or clinic and will not be stored at home. NOTE: This sheet is a summary. It may not cover all possible information. If you have questions about this medicine, talk to your doctor, pharmacist, or health care provider.  2015, [employer] Standard. (2007-12-18 16:43:56) Carboplatin injection What is this medicine? CARBOPLATIN (KAR boe pla tin) is a chemotherapy drug. It targets  fast dividing cells, like cancer cells, and causes these cells to die. This medicine is used to treat ovarian cancer and many other cancers. This medicine may be used for other purposes; ask your health care provider or pharmacist if you have questions. COMMON BRAND NAME(S): Paraplatin What should I tell my health care provider before I take this medicine? They need to know if you have any of these conditions: -blood disorders -hearing problems -kidney disease -recent or ongoing radiation therapy -an unusual or allergic reaction to carboplatin, cisplatin, other chemotherapy, other medicines, foods, dyes, or preservatives -pregnant or trying to get pregnant -breast-feeding How should I use this medicine? This  drug is usually given as an infusion into a vein. It is administered in a hospital or clinic by a specially trained health care professional. Talk to your pediatrician regarding the use of this medicine in children. Special care may be needed. Overdosage: If you think you have taken too much of this medicine contact a poison control center or emergency room at once. NOTE: This medicine is only for you. Do not share this medicine with others. What if I miss a dose? It is important not to miss a dose. Call your doctor or health care professional if you are unable to keep an appointment. What may interact with this medicine? -medicines for seizures -medicines to increase blood counts like filgrastim, pegfilgrastim, sargramostim -some antibiotics like amikacin, gentamicin, neomycin, streptomycin, tobramycin -vaccines Talk to your doctor or health care professional before taking any of these medicines: -acetaminophen -aspirin -ibuprofen -ketoprofen -naproxen This list may not describe all possible interactions. Give your health care provider a list of all the medicines, herbs, non-prescription drugs, or dietary supplements you use. Also tell them if you smoke, drink alcohol, or use illegal drugs. Some items may interact with your medicine. What should I watch for while using this medicine? Your condition will be monitored carefully while you are receiving this medicine. You will need important blood work done while you are taking this medicine. This drug may make you feel generally unwell. This is not uncommon, as chemotherapy can affect healthy cells as well as cancer cells. Report any side effects. Continue your course of treatment even though you feel ill unless your doctor tells you to stop. In some cases, you may be given additional medicines to help with side effects. Follow all directions for their use. Call your doctor or health care professional for advice if you get a fever, chills or sore  throat, or other symptoms of a cold or flu. Do not treat yourself. This drug decreases your body's ability to fight infections. Try to avoid being around people who are sick. This medicine may increase your risk to bruise or bleed. Call your doctor or health care professional if you notice any unusual bleeding. Be careful brushing and flossing your teeth or using a toothpick because you may get an infection or bleed more easily. If you have any dental work done, tell your dentist you are receiving this medicine. Avoid taking products that contain aspirin, acetaminophen, ibuprofen, naproxen, or ketoprofen unless instructed by your doctor. These medicines may hide a fever. Do not become pregnant while taking this medicine. Women should inform their doctor if they wish to become pregnant or think they might be pregnant. There is a potential for serious side effects to an unborn child. Talk to your health care professional or pharmacist for more information. Do not breast-feed an infant while taking this medicine. What side effects  may I notice from receiving this medicine? Side effects that you should report to your doctor or health care professional as soon as possible: -allergic reactions like skin rash, itching or hives, swelling of the face, lips, or tongue -signs of infection - fever or chills, cough, sore throat, pain or difficulty passing urine -signs of decreased platelets or bleeding - bruising, pinpoint red spots on the skin, black, tarry stools, nosebleeds -signs of decreased red blood cells - unusually weak or tired, fainting spells, lightheadedness -breathing problems -changes in hearing -changes in vision -chest pain -high blood pressure -low blood counts - This drug may decrease the number of white blood cells, red blood cells and platelets. You may be at increased risk for infections and bleeding. -nausea and vomiting -pain, swelling, redness or irritation at the injection site -pain,  tingling, numbness in the hands or feet -problems with balance, talking, walking -trouble passing urine or change in the amount of urine Side effects that usually do not require medical attention (report to your doctor or health care professional if they continue or are bothersome): -hair loss -loss of appetite -metallic taste in the mouth or changes in taste This list may not describe all possible side effects. Call your doctor for medical advice about side effects. You may report side effects to FDA at 1-800-FDA-1088. Where should I keep my medicine? This drug is given in a hospital or clinic and will not be stored at home. NOTE: This sheet is a summary. It may not cover all possible information. If you have questions about this medicine, talk to your doctor, pharmacist, or health care provider.  2015, [employer] Standard. (2007-09-05 14:38:05) Etoposide, VP-16 injection What is this medicine? ETOPOSIDE, VP-16 (e toe POE side) is a chemotherapy drug. It is used to treat testicular cancer, lung cancer, and other cancers. This medicine may be used for other purposes; ask your health care provider or pharmacist if you have questions. COMMON BRAND NAME(S): Etopophos, Toposar, VePesid What should I tell my health care provider before I take this medicine? They need to know if you have any of these conditions: -infection -kidney disease -low blood counts, like low white cell, platelet, or red cell counts -an unusual or allergic reaction to etoposide, other chemotherapeutic agents, other medicines, foods, dyes, or preservatives -pregnant or trying to get pregnant -breast-feeding How should I use this medicine? This medicine is for infusion into a vein. It is administered in a hospital or clinic by a specially trained health care professional. Talk to your pediatrician regarding the use of this medicine in children. Special care may be needed. Overdosage: If you think you have taken too  much of this medicine contact a poison control center or emergency room at once. NOTE: This medicine is only for you. Do not share this medicine with others. What if I miss a dose? It is important not to miss your dose. Call your doctor or health care professional if you are unable to keep an appointment. What may interact with this medicine? -cyclosporine -medicines to increase blood counts like filgrastim, pegfilgrastim, sargramostim -vaccines This list may not describe all possible interactions. Give your health care provider a list of all the medicines, herbs, non-prescription drugs, or dietary supplements you use. Also tell them if you smoke, drink alcohol, or use illegal drugs. Some items may interact with your medicine. What should I watch for while using this medicine? Visit your doctor for checks on your progress. This drug may make you feel generally  unwell. This is not uncommon, as chemotherapy can affect healthy cells as well as cancer cells. Report any side effects. Continue your course of treatment even though you feel ill unless your doctor tells you to stop. In some cases, you may be given additional medicines to help with side effects. Follow all directions for their use. Call your doctor or health care professional for advice if you get a fever, chills or sore throat, or other symptoms of a cold or flu. Do not treat yourself. This drug decreases your body's ability to fight infections. Try to avoid being around people who are sick. This medicine may increase your risk to bruise or bleed. Call your doctor or health care professional if you notice any unusual bleeding. Be careful brushing and flossing your teeth or using a toothpick because you may get an infection or bleed more easily. If you have any dental work done, tell your dentist you are receiving this medicine. Avoid taking products that contain aspirin, acetaminophen, ibuprofen, naproxen, or ketoprofen unless instructed by  your doctor. These medicines may hide a fever. Do not become pregnant while taking this medicine. Women should inform their doctor if they wish to become pregnant or think they might be pregnant. There is a potential for serious side effects to an unborn child. Talk to your health care professional or pharmacist for more information. Do not breast-feed an infant while taking this medicine. What side effects may I notice from receiving this medicine? Side effects that you should report to your doctor or health care professional as soon as possible: -allergic reactions like skin rash, itching or hives, swelling of the face, lips, or tongue -low blood counts - this medicine may decrease the number of white blood cells, red blood cells and platelets. You may be at increased risk for infections and bleeding. -signs of infection - fever or chills, cough, sore throat, pain or difficulty passing urine -signs of decreased platelets or bleeding - bruising, pinpoint red spots on the skin, black, tarry stools, blood in the urine -signs of decreased red blood cells - unusually weak or tired, fainting spells, lightheadedness -breathing problems -changes in vision -mouth or throat sores or ulcers -pain, redness, swelling or irritation at the injection site -pain, tingling, numbness in the hands or feet -redness, blistering, peeling or loosening of the skin, including inside the mouth -seizures -vomiting Side effects that usually do not require medical attention (report to your doctor or health care professional if they continue or are bothersome): -diarrhea -hair loss -loss of appetite -nausea -stomach pain This list may not describe all possible side effects. Call your doctor for medical advice about side effects. You may report side effects to FDA at 1-800-FDA-1088. Where should I keep my medicine? This drug is given in a hospital or clinic and will not be stored at home. NOTE: This sheet is a summary.  It may not cover all possible information. If you have questions about this medicine, talk to your doctor, pharmacist, or health care provider.  2015, [employer]. (2007-10-02 17:24:12)

## 2014-11-07 ENCOUNTER — Ambulatory Visit: Payer: Medicare Other

## 2014-11-07 ENCOUNTER — Inpatient Hospital Stay: Payer: Medicare Other

## 2014-11-07 VITALS — BP 100/63 | HR 93 | Temp 97.0°F | Resp 20

## 2014-11-07 DIAGNOSIS — C8310 Mantle cell lymphoma, unspecified site: Secondary | ICD-10-CM

## 2014-11-07 DIAGNOSIS — Z5111 Encounter for antineoplastic chemotherapy: Secondary | ICD-10-CM | POA: Diagnosis not present

## 2014-11-07 LAB — BASIC METABOLIC PANEL
Anion gap: 9 (ref 5–15)
BUN: 27 mg/dL — ABNORMAL HIGH (ref 6–20)
CO2: 22 mmol/L (ref 22–32)
Calcium: 8.5 mg/dL — ABNORMAL LOW (ref 8.9–10.3)
Chloride: 103 mmol/L (ref 101–111)
Creatinine, Ser: 1.23 mg/dL (ref 0.61–1.24)
GFR calc Af Amer: >60 mL/min (ref >60)
GFR calc non Af Amer: 59 mL/min — ABNORMAL LOW (ref >60)
Glucose, Bld: 145 mg/dL — ABNORMAL HIGH (ref 65–99)
Potassium: 4.5 mmol/L (ref 3.5–5.1)
Sodium: 134 mmol/L — ABNORMAL LOW (ref 135–145)

## 2014-11-07 LAB — URIC ACID: Uric Acid, Serum: 8.2 mg/dL — ABNORMAL HIGH (ref 4.4–7.6)

## 2014-11-07 MED ORDER — ACETAMINOPHEN 325 MG PO TABS
650.0000 mg | ORAL_TABLET | Freq: Once | ORAL | Status: AC
  Administered 2014-11-07: 650 mg via ORAL
  Filled 2014-11-07: qty 2

## 2014-11-07 MED ORDER — SODIUM CHLORIDE 0.45 % IV SOLN
Freq: Once | INTRAVENOUS | Status: AC
  Administered 2014-11-07: 10:00:00 via INTRAVENOUS
  Filled 2014-11-07: qty 1000

## 2014-11-07 MED ORDER — SODIUM CHLORIDE 0.9 % IV SOLN
100.0000 mg/m2 | Freq: Once | INTRAVENOUS | Status: AC
  Administered 2014-11-07: 190 mg via INTRAVENOUS
  Filled 2014-11-07: qty 9.5

## 2014-11-07 MED ORDER — SODIUM CHLORIDE 0.9 % IV SOLN
Freq: Once | INTRAVENOUS | Status: DC
  Filled 2014-11-07: qty 191

## 2014-11-07 MED ORDER — SODIUM CHLORIDE 0.9 % IV SOLN
Freq: Once | INTRAVENOUS | Status: AC
  Administered 2014-11-07: 16 mg via INTRAVENOUS
  Filled 2014-11-07: qty 8

## 2014-11-07 MED ORDER — SODIUM CHLORIDE 0.9 % IV SOLN
Freq: Once | INTRAVENOUS | Status: AC
  Administered 2014-11-07: 16:00:00 via INTRAVENOUS
  Filled 2014-11-07: qty 191

## 2014-11-07 MED ORDER — DIPHENHYDRAMINE HCL 50 MG/ML IJ SOLN
25.0000 mg | Freq: Once | INTRAMUSCULAR | Status: AC
  Administered 2014-11-07: 25 mg via INTRAVENOUS
  Filled 2014-11-07: qty 1

## 2014-11-07 MED ORDER — SODIUM CHLORIDE 0.9 % IV SOLN
400.0000 mg/m2 | Freq: Once | INTRAVENOUS | Status: AC
  Administered 2014-11-07: 750 mg via INTRAVENOUS
  Filled 2014-11-07: qty 7.5

## 2014-11-07 MED ORDER — SODIUM CHLORIDE 0.9 % IV SOLN
300.0000 mg | Freq: Once | INTRAVENOUS | Status: AC
  Administered 2014-11-07: 300 mg via INTRAVENOUS
  Filled 2014-11-07: qty 30

## 2014-11-07 MED ORDER — SODIUM CHLORIDE 0.9 % IV SOLN
433.5000 mg | Freq: Once | INTRAVENOUS | Status: AC
  Administered 2014-11-07: 430 mg via INTRAVENOUS
  Filled 2014-11-07: qty 43

## 2014-11-07 MED ORDER — HEPARIN SOD (PORK) LOCK FLUSH 100 UNIT/ML IV SOLN
500.0000 [IU] | Freq: Once | INTRAVENOUS | Status: AC | PRN
  Filled 2014-11-07: qty 5

## 2014-11-08 ENCOUNTER — Telehealth: Payer: Self-pay | Admitting: *Deleted

## 2014-11-08 ENCOUNTER — Ambulatory Visit: Payer: Medicare Other

## 2014-11-08 ENCOUNTER — Emergency Department: Payer: Medicare Other

## 2014-11-08 ENCOUNTER — Other Ambulatory Visit: Payer: Self-pay

## 2014-11-08 ENCOUNTER — Inpatient Hospital Stay: Payer: Medicare Other

## 2014-11-08 ENCOUNTER — Inpatient Hospital Stay
Admission: EM | Admit: 2014-11-08 | Discharge: 2014-11-19 | DRG: 871 | Disposition: A | Discharge status: Skilled Nursing Facility | Payer: Medicare Other | Attending: Internal Medicine | Admitting: Internal Medicine

## 2014-11-08 DIAGNOSIS — R1084 Generalized abdominal pain: Secondary | ICD-10-CM | POA: Diagnosis present

## 2014-11-08 DIAGNOSIS — E883 Tumor lysis syndrome: Secondary | ICD-10-CM | POA: Diagnosis present

## 2014-11-08 DIAGNOSIS — M109 Gout, unspecified: Secondary | ICD-10-CM | POA: Diagnosis present

## 2014-11-08 DIAGNOSIS — G893 Neoplasm related pain (acute) (chronic): Secondary | ICD-10-CM | POA: Diagnosis present

## 2014-11-08 DIAGNOSIS — K30 Functional dyspepsia: Secondary | ICD-10-CM | POA: Diagnosis present

## 2014-11-08 DIAGNOSIS — D630 Anemia in neoplastic disease: Secondary | ICD-10-CM | POA: Diagnosis present

## 2014-11-08 DIAGNOSIS — N183 Chronic kidney disease, stage 3 (moderate): Secondary | ICD-10-CM | POA: Diagnosis present

## 2014-11-08 DIAGNOSIS — A419 Sepsis, unspecified organism: Secondary | ICD-10-CM | POA: Diagnosis not present

## 2014-11-08 DIAGNOSIS — R109 Unspecified abdominal pain: Secondary | ICD-10-CM | POA: Diagnosis present

## 2014-11-08 DIAGNOSIS — J811 Chronic pulmonary edema: Secondary | ICD-10-CM | POA: Diagnosis present

## 2014-11-08 DIAGNOSIS — R0602 Shortness of breath: Secondary | ICD-10-CM

## 2014-11-08 DIAGNOSIS — M1990 Unspecified osteoarthritis, unspecified site: Secondary | ICD-10-CM | POA: Diagnosis present

## 2014-11-08 DIAGNOSIS — Z515 Encounter for palliative care: Secondary | ICD-10-CM

## 2014-11-08 DIAGNOSIS — N1339 Other hydronephrosis: Secondary | ICD-10-CM | POA: Diagnosis present

## 2014-11-08 DIAGNOSIS — M7989 Other specified soft tissue disorders: Secondary | ICD-10-CM

## 2014-11-08 DIAGNOSIS — Z79891 Long term (current) use of opiate analgesic: Secondary | ICD-10-CM

## 2014-11-08 DIAGNOSIS — Y848 Other medical procedures as the cause of abnormal reaction of the patient, or of later complication, without mention of misadventure at the time of the procedure: Secondary | ICD-10-CM | POA: Diagnosis present

## 2014-11-08 DIAGNOSIS — C8310 Mantle cell lymphoma, unspecified site: Secondary | ICD-10-CM

## 2014-11-08 DIAGNOSIS — Z87891 Personal history of nicotine dependence: Secondary | ICD-10-CM

## 2014-11-08 DIAGNOSIS — R443 Hallucinations, unspecified: Secondary | ICD-10-CM | POA: Diagnosis present

## 2014-11-08 DIAGNOSIS — D696 Thrombocytopenia, unspecified: Secondary | ICD-10-CM | POA: Insufficient documentation

## 2014-11-08 DIAGNOSIS — R410 Disorientation, unspecified: Secondary | ICD-10-CM

## 2014-11-08 DIAGNOSIS — Y92230 Patient room in hospital as the place of occurrence of the external cause: Secondary | ICD-10-CM

## 2014-11-08 DIAGNOSIS — C8313 Mantle cell lymphoma, intra-abdominal lymph nodes: Secondary | ICD-10-CM | POA: Diagnosis present

## 2014-11-08 DIAGNOSIS — M069 Rheumatoid arthritis, unspecified: Secondary | ICD-10-CM | POA: Diagnosis present

## 2014-11-08 DIAGNOSIS — T8089XA Other complications following infusion, transfusion and therapeutic injection, initial encounter: Secondary | ICD-10-CM | POA: Diagnosis present

## 2014-11-08 DIAGNOSIS — I129 Hypertensive chronic kidney disease with stage 1 through stage 4 chronic kidney disease, or unspecified chronic kidney disease: Secondary | ICD-10-CM | POA: Diagnosis present

## 2014-11-08 DIAGNOSIS — Z66 Do not resuscitate: Secondary | ICD-10-CM | POA: Diagnosis present

## 2014-11-08 DIAGNOSIS — R05 Cough: Secondary | ICD-10-CM

## 2014-11-08 DIAGNOSIS — D6959 Other secondary thrombocytopenia: Secondary | ICD-10-CM | POA: Diagnosis present

## 2014-11-08 DIAGNOSIS — H02849 Edema of unspecified eye, unspecified eyelid: Secondary | ICD-10-CM | POA: Diagnosis present

## 2014-11-08 DIAGNOSIS — R059 Cough, unspecified: Secondary | ICD-10-CM

## 2014-11-08 DIAGNOSIS — E876 Hypokalemia: Secondary | ICD-10-CM | POA: Diagnosis present

## 2014-11-08 DIAGNOSIS — N1330 Unspecified hydronephrosis: Secondary | ICD-10-CM | POA: Diagnosis present

## 2014-11-08 DIAGNOSIS — N179 Acute kidney failure, unspecified: Secondary | ICD-10-CM | POA: Diagnosis present

## 2014-11-08 DIAGNOSIS — Z7982 Long term (current) use of aspirin: Secondary | ICD-10-CM

## 2014-11-08 HISTORY — DX: Chronic kidney disease, unspecified: N18.9

## 2014-11-08 LAB — CBC WITH DIFFERENTIAL/PLATELET
BASOS ABS: 0.1 10*3/uL (ref 0–0.1)
Basophils Relative: 1 %
EOS ABS: 0 10*3/uL (ref 0–0.7)
Eosinophils Relative: 1 %
HEMATOCRIT: 28.2 % — AB (ref 40.0–52.0)
Hemoglobin: 9.4 g/dL — ABNORMAL LOW (ref 13.0–18.0)
LYMPHS PCT: 5 %
Lymphs Abs: 0.5 10*3/uL — ABNORMAL LOW (ref 1.0–3.6)
MCH: 27.7 pg (ref 26.0–34.0)
MCHC: 33.4 g/dL (ref 32.0–36.0)
MCV: 83.1 fL (ref 80.0–100.0)
MONO ABS: 0.2 10*3/uL (ref 0.2–1.0)
Monocytes Relative: 3 %
NEUTROS ABS: 7.8 10*3/uL — AB (ref 1.4–6.5)
NEUTROS PCT: 90 %
Platelets: 133 10*3/uL — ABNORMAL LOW (ref 150–440)
RBC: 3.39 MIL/uL — AB (ref 4.40–5.90)
RDW: 20.7 % — ABNORMAL HIGH (ref 11.5–14.5)
WBC: 8.6 10*3/uL (ref 3.8–10.6)

## 2014-11-08 MED ORDER — HYDROMORPHONE HCL 1 MG/ML IJ SOLN
INTRAMUSCULAR | Status: AC
  Administered 2014-11-08: 1 mg via INTRAVENOUS
  Filled 2014-11-08: qty 1

## 2014-11-08 MED ORDER — ONDANSETRON HCL 4 MG/2ML IJ SOLN
INTRAMUSCULAR | Status: AC
  Administered 2014-11-08: 2 mg
  Filled 2014-11-08: qty 4

## 2014-11-08 MED ORDER — PIPERACILLIN-TAZOBACTAM 3.375 G IVPB
INTRAVENOUS | Status: AC
  Filled 2014-11-08: qty 50

## 2014-11-08 MED ORDER — HYDROMORPHONE HCL 1 MG/ML IJ SOLN
1.0000 mg | INTRAMUSCULAR | Status: AC
  Administered 2014-11-08: 1 mg via INTRAVENOUS

## 2014-11-08 MED ORDER — ONDANSETRON HCL 40 MG/20ML IJ SOLN
8.0000 mg | Freq: Once | INTRAMUSCULAR | Status: AC
  Administered 2014-11-08: 8 mg via INTRAVENOUS
  Filled 2014-11-08: qty 4

## 2014-11-08 MED ORDER — VANCOMYCIN HCL IN DEXTROSE 1-5 GM/200ML-% IV SOLN
1000.0000 mg | Freq: Once | INTRAVENOUS | Status: AC
  Administered 2014-11-08: 1000 mg via INTRAVENOUS
  Filled 2014-11-08: qty 200

## 2014-11-08 MED ORDER — PIPERACILLIN-TAZOBACTAM 3.375 G IVPB 30 MIN
3.3750 g | Freq: Once | INTRAVENOUS | Status: AC
  Administered 2014-11-08: 3.375 g via INTRAVENOUS

## 2014-11-08 MED ORDER — SODIUM CHLORIDE 0.9 % IV BOLUS (SEPSIS)
500.0000 mL | INTRAVENOUS | Status: AC
  Administered 2014-11-09: 500 mL via INTRAVENOUS

## 2014-11-08 MED ORDER — HYDROMORPHONE HCL 1 MG/ML IJ SOLN
INTRAMUSCULAR | Status: AC
  Filled 2014-11-08: qty 1

## 2014-11-08 MED ORDER — SODIUM CHLORIDE 0.9 % IV BOLUS (SEPSIS)
1000.0000 mL | INTRAVENOUS | Status: AC
  Administered 2014-11-08: 1000 mL via INTRAVENOUS

## 2014-11-08 NOTE — Telephone Encounter (Signed)
Patient missed his appointment today for VP-16 and to have his continuous infusion pump removed. RN tried to contact patient without success.  Contacted patient's son who was able to get him.  Called pt back and was once again unable to reach him.  Left message that he should come to clinic today by 4 PM to have his pump removed. It is too late for VP-16 infusion.  If he absolutely cannot come today he should be here tomorrow morning promptly at 10 AM to have pump removed.

## 2014-11-08 NOTE — ED Provider Notes (Signed)
[facility] Emergency Department Provider Note  ____________________________________________  Time seen: 10:20 PM on arrival by EMS  I have reviewed the triage vital signs and the nursing notes.   HISTORY  Chief Complaint Extremity Pain  abdominal pain   HPI Caleb Francis is a 69 y.o. male who complains of diffuse abdominal pain that is severe, sharp, nonradiating, no aggravating or alleviating factors. It is gradual onset and continued worsening. He has cancer and had chemotherapy yesterday. He is also having nausea with some vomiting without diarrhea. No chest pain shortness of breath or syncope.     Past Medical History  Diagnosis Date  . Arthritis   . Hypertension   . RA (rheumatoid arthritis)   . Anemia   . Cancer     lymphoma  . History of nuclear stress test     a. 12/2013: low risk, no sig ischemia, no EKG changes, no artifact, EF 63%    Patient Active Problem List   Diagnosis Date Noted  . Hypokalemia 10/31/2014  . Neoplasm related pain 10/31/2014  . Sinus tachycardia 10/31/2014  . Abdominal distention   . Hydronephrosis of left kidney 10/24/2014  . Mantle cell lymphoma 10/24/2014    Past Surgical History  Procedure Laterality Date  . Back surgery    . Fracture surgery      ankle  . Portacath placement    . Cystoscopy w/ ureteral stent placement Left 10/24/2014    Procedure: CYSTOSCOPY WITH RETROGRADE PYELOGRAM/URETERAL STENT PLACEMENT;  Surgeon: Irine Seal, MD;  Location: [facility] ORS;  Service: Urology;  Laterality: Left;    Current Outpatient Rx  Name  Route  Sig  Dispense  Refill  . acetaminophen (TYLENOL) 325 MG tablet   Oral   Take 2 tablets (650 mg total) by mouth every 6 (six) hours as needed for mild pain or fever.   30 tablet   0   . allopurinol (ZYLOPRIM) 300 MG tablet   Oral   Take 1 tablet (300 mg total) by mouth daily.   30 tablet   2   . amLODipine (NORVASC) 5 MG tablet   Oral   Take 1 tablet (5 mg  total) by mouth daily.   30 tablet   1   . amoxicillin-clavulanate (AUGMENTIN) 875-125 MG per tablet   Oral   Take 1 tablet by mouth every 12 (twelve) hours.   14 tablet   0   . docusate sodium (COLACE) 100 MG capsule   Oral   Take 1 capsule (100 mg total) by mouth 2 (two) times daily.   10 capsule   0   . feeding supplement, ENSURE ENLIVE, (ENSURE ENLIVE) LIQD   Oral   Take 237 mLs by mouth 3 (three) times daily between meals.   324 mL   12   . folic acid (FOLVITE) 1 MG tablet   Oral   Take 1 mg by mouth.         [name] KLOR-CON M10 10 MEQ tablet                 Dispense as written.   . magnesium oxide (MAG-OX) 400 (241.3 MG) MG tablet   Oral   Take 1 tablet (400 mg total) by mouth 2 (two) times daily.   30 tablet   1   . metoprolol tartrate 37.5 MG TABS   Oral   Take 37.5 mg by mouth 2 (two) times daily.   60 tablet   0   .  oxybutynin (DITROPAN) 5 MG tablet   Oral   Take 1 tablet (5 mg total) by mouth every 8 (eight) hours as needed for bladder spasms (spasms or stent related pain).   30 tablet   0   . oxyCODONE-acetaminophen (PERCOCET/ROXICET) 5-325 MG per tablet   Oral   Take 1-2 tablets by mouth every 4 (four) hours as needed for moderate pain.   30 tablet   0   . spironolactone (ALDACTONE) 25 MG tablet   Oral   Take 1 tablet (25 mg total) by mouth daily.   30 tablet   1     Allergies Review of patient's allergies indicates no known allergies.  Family History  Problem Relation Age of Onset  . Cancer Mother     breast  . Cancer Father     bone cancer    Social History History  Substance Use Topics  . Smoking status: Former Smoker -- 0.50 packs/day for 60 years    Types: Cigarettes    Quit date: 10/18/2014  . Smokeless tobacco: Not on file  . Alcohol Use: No    Review of Systems  Constitutional: No fever or chills. No weight changes Eyes:No blurry vision or double vision.  ENT: No sore throat. Cardiovascular: No chest  pain. Respiratory: No dyspnea or cough. Gastrointestinal: Abdominal pain and vomiting.  No BRBPR or melena. Genitourinary: Negative for dysuria, urinary retention, bloody urine, or difficulty urinating. Musculoskeletal: Negative for back pain. No joint swelling or pain. Skin: Negative for rash. Neurological: Negative for headaches, focal weakness or numbness. Psychiatric:No anxiety or depression.   Endocrine:No hot/cold intolerance, changes in energy, or sleep difficulty.  10-point ROS otherwise negative.  ____________________________________________   PHYSICAL EXAM:  VITAL SIGNS: ED Triage Vitals  Enc Vitals Group     BP --      Pulse --      Resp --      Temp --      Temp src --      SpO2 --      Weight --      Height --      Head Cir --      Peak Flow --      Pain Score --      Pain Loc --      Pain Edu? --      Excl. in [place]? --      Constitutional: Alert and oriented. Severe distress due to pain. Eyes: No scleral icterus. No conjunctival pallor. PERRL. EOMI ENT   Head: Normocephalic and atraumatic.   Nose: No congestion/rhinnorhea. No septal hematoma   Mouth/Throat: Dry mucous membranes, no pharyngeal erythema. No peritonsillar mass. No uvula shift.   Neck: No stridor. No SubQ emphysema. No meningismus. Hematological/Lymphatic/Immunilogical: No cervical lymphadenopathy. Cardiovascular: Tachycardia with heart rate 1:30. Normal and symmetric distal pulses are present in all extremities. No murmurs, rubs, or gallops. Respiratory: Normal respiratory effort without tachypnea nor retractions. Breath sounds are clear and equal bilaterally. No wheezes/rales/rhonchi. Gastrointestinal: Soft with diffuse tenderness. Distended and tympanitic. There is no CVA tenderness.  No rebound, rigidity, or guarding. Genitourinary: deferred Musculoskeletal: Nontender with normal range of motion in all extremities. No joint effusions.  No lower extremity tenderness.  No  edema. Neurologic:   Normal speech and language.  CN 2-10 normal. Motor grossly intact. No pronator drift.  Normal gait. No gross focal neurologic deficits are appreciated.  Skin:  Skin is warm, dry and intact. No rash noted.  No petechiae, purpura,  or bullae. Psychiatric: Mood and affect are normal. Speech and behavior are normal. Patient exhibits appropriate insight and judgment.  ____________________________________________    LABS (pertinent positives/negatives) (all labs ordered are listed, but only abnormal results are displayed) Labs Reviewed  CBC WITH DIFFERENTIAL/PLATELET - Abnormal; Notable for the following:    RBC 3.39 (*)    Hemoglobin 9.4 (*)    HCT 28.2 (*)    RDW 20.7 (*)    Platelets 133 (*)    Neutro Abs 7.8 (*)    Lymphs Abs 0.5 (*)    All other components within normal limits  CULTURE, BLOOD (ROUTINE X 2)  CULTURE, BLOOD (ROUTINE X 2)  URINE CULTURE  CULTURE, EXPECTORATED SPUTUM-ASSESSMENT  PHOSPHORUS  MAGNESIUM  URIC ACID  APTT  COMPREHENSIVE METABOLIC PANEL  TROPONIN I  LACTIC ACID, PLASMA  LACTIC ACID, PLASMA  LIPASE, BLOOD  PROTIME-INR  URINALYSIS COMPLETEWITH MICROSCOPIC ([facility] ONLY)   ____________________________________________   EKG  Interpreted by me Sinus tachycardia rate 129, normal axis intervals QRS and ST segments and T waves.  ____________________________________________    RADIOLOGY  Chest x-ray unremarkable  ____________________________________________   PROCEDURES CRITICAL CARE Performed by: Joni Fears, Burnette Valenti   Total critical care time: 35 minutes  Critical care time was exclusive of separately billable procedures and treating other patients.  Critical care was necessary to treat or prevent imminent or life-threatening deterioration.  Critical care was time spent personally by me on the following activities: development of treatment plan with patient and/or surrogate as well as nursing, discussions with  consultants, evaluation of patient's response to treatment, examination of patient, obtaining history from patient or surrogate, ordering and performing treatments and interventions, ordering and review of laboratory studies, ordering and review of radiographic studies, pulse oximetry and re-evaluation of patient's condition.  ____________________________________________   INITIAL IMPRESSION / ASSESSMENT AND PLAN / ED COURSE  Pertinent labs & imaging results that were available during my care of the patient were reviewed by me and considered in my medical decision making (see chart for details).  Patient febrile and tachycardic with possible immunosuppression or GI source of infection due to the severe abdominal pain. We have him Dilaudid and IV Zofran with IV fluids for now for symptom control, we'll recheck labs. We'll get a CT of the abdomen pelvis to further evaluate, and plan to admit the patient for further evaluation of his symptoms. Low suspicion of perforation or ACS at this time. With his history of lymphoma and recent chemotherapy we will also do sense some labs to evaluate for tumor lysis syndrome. ----------------------------------------- 12:05 AM on 11/09/2014 -----------------------------------------  Heart rate and pain improved with IV Dilaudid. We'll continue when necessary Dilaudid. CT abdomen and pelvis ordered. Care of the patient is signed out to oncoming physician Dr. Delman Kitten for follow-up and workup and disposition ____________________________________________   FINAL CLINICAL IMPRESSION(S) / ED DIAGNOSES  Final diagnoses:  Sepsis, due to unspecified organism   generalized abdominal pain    Carrie Mew, MD 11/09/14 0005

## 2014-11-09 ENCOUNTER — Inpatient Hospital Stay: Payer: Medicare Other

## 2014-11-09 ENCOUNTER — Emergency Department: Payer: Medicare Other

## 2014-11-09 DIAGNOSIS — R109 Unspecified abdominal pain: Secondary | ICD-10-CM | POA: Diagnosis present

## 2014-11-09 DIAGNOSIS — R509 Fever, unspecified: Secondary | ICD-10-CM | POA: Diagnosis not present

## 2014-11-09 DIAGNOSIS — M109 Gout, unspecified: Secondary | ICD-10-CM | POA: Diagnosis present

## 2014-11-09 DIAGNOSIS — R1084 Generalized abdominal pain: Secondary | ICD-10-CM | POA: Diagnosis present

## 2014-11-09 DIAGNOSIS — N183 Chronic kidney disease, stage 3 (moderate): Secondary | ICD-10-CM | POA: Diagnosis present

## 2014-11-09 DIAGNOSIS — N1330 Unspecified hydronephrosis: Secondary | ICD-10-CM | POA: Diagnosis not present

## 2014-11-09 DIAGNOSIS — Z66 Do not resuscitate: Secondary | ICD-10-CM | POA: Diagnosis present

## 2014-11-09 DIAGNOSIS — R4182 Altered mental status, unspecified: Secondary | ICD-10-CM | POA: Diagnosis not present

## 2014-11-09 DIAGNOSIS — A419 Sepsis, unspecified organism: Principal | ICD-10-CM

## 2014-11-09 DIAGNOSIS — J811 Chronic pulmonary edema: Secondary | ICD-10-CM | POA: Diagnosis present

## 2014-11-09 DIAGNOSIS — Z87891 Personal history of nicotine dependence: Secondary | ICD-10-CM | POA: Diagnosis not present

## 2014-11-09 DIAGNOSIS — T8089XA Other complications following infusion, transfusion and therapeutic injection, initial encounter: Secondary | ICD-10-CM | POA: Diagnosis present

## 2014-11-09 DIAGNOSIS — C8310 Mantle cell lymphoma, unspecified site: Secondary | ICD-10-CM | POA: Diagnosis not present

## 2014-11-09 DIAGNOSIS — D630 Anemia in neoplastic disease: Secondary | ICD-10-CM | POA: Diagnosis present

## 2014-11-09 DIAGNOSIS — E883 Tumor lysis syndrome: Secondary | ICD-10-CM | POA: Diagnosis present

## 2014-11-09 DIAGNOSIS — H02849 Edema of unspecified eye, unspecified eyelid: Secondary | ICD-10-CM | POA: Diagnosis present

## 2014-11-09 DIAGNOSIS — E876 Hypokalemia: Secondary | ICD-10-CM | POA: Diagnosis present

## 2014-11-09 DIAGNOSIS — M069 Rheumatoid arthritis, unspecified: Secondary | ICD-10-CM | POA: Diagnosis present

## 2014-11-09 DIAGNOSIS — Y92230 Patient room in hospital as the place of occurrence of the external cause: Secondary | ICD-10-CM | POA: Diagnosis not present

## 2014-11-09 DIAGNOSIS — N179 Acute kidney failure, unspecified: Secondary | ICD-10-CM | POA: Diagnosis present

## 2014-11-09 DIAGNOSIS — T451X5A Adverse effect of antineoplastic and immunosuppressive drugs, initial encounter: Secondary | ICD-10-CM | POA: Diagnosis not present

## 2014-11-09 DIAGNOSIS — K30 Functional dyspepsia: Secondary | ICD-10-CM | POA: Diagnosis present

## 2014-11-09 DIAGNOSIS — Z515 Encounter for palliative care: Secondary | ICD-10-CM | POA: Diagnosis not present

## 2014-11-09 DIAGNOSIS — Y848 Other medical procedures as the cause of abnormal reaction of the patient, or of later complication, without mention of misadventure at the time of the procedure: Secondary | ICD-10-CM | POA: Diagnosis present

## 2014-11-09 DIAGNOSIS — R5081 Fever presenting with conditions classified elsewhere: Secondary | ICD-10-CM | POA: Diagnosis not present

## 2014-11-09 DIAGNOSIS — D701 Agranulocytosis secondary to cancer chemotherapy: Secondary | ICD-10-CM | POA: Diagnosis not present

## 2014-11-09 DIAGNOSIS — R443 Hallucinations, unspecified: Secondary | ICD-10-CM | POA: Diagnosis present

## 2014-11-09 DIAGNOSIS — D6959 Other secondary thrombocytopenia: Secondary | ICD-10-CM | POA: Diagnosis present

## 2014-11-09 DIAGNOSIS — C8313 Mantle cell lymphoma, intra-abdominal lymph nodes: Secondary | ICD-10-CM | POA: Diagnosis present

## 2014-11-09 DIAGNOSIS — Z7982 Long term (current) use of aspirin: Secondary | ICD-10-CM | POA: Diagnosis not present

## 2014-11-09 DIAGNOSIS — G893 Neoplasm related pain (acute) (chronic): Secondary | ICD-10-CM

## 2014-11-09 DIAGNOSIS — Z79891 Long term (current) use of opiate analgesic: Secondary | ICD-10-CM | POA: Diagnosis not present

## 2014-11-09 DIAGNOSIS — M1990 Unspecified osteoarthritis, unspecified site: Secondary | ICD-10-CM | POA: Diagnosis present

## 2014-11-09 DIAGNOSIS — N1339 Other hydronephrosis: Secondary | ICD-10-CM | POA: Diagnosis present

## 2014-11-09 DIAGNOSIS — I129 Hypertensive chronic kidney disease with stage 1 through stage 4 chronic kidney disease, or unspecified chronic kidney disease: Secondary | ICD-10-CM | POA: Diagnosis present

## 2014-11-09 LAB — COMPREHENSIVE METABOLIC PANEL
ALBUMIN: 2.6 g/dL — AB (ref 3.5–5.0)
ALT: 29 U/L (ref 17–63)
AST: 38 U/L (ref 15–41)
Alkaline Phosphatase: 194 U/L — ABNORMAL HIGH (ref 38–126)
Anion gap: 11 (ref 5–15)
BUN: 22 mg/dL — ABNORMAL HIGH (ref 6–20)
CO2: 23 mmol/L (ref 22–32)
CREATININE: 1.12 mg/dL (ref 0.61–1.24)
Calcium: 9.1 mg/dL (ref 8.9–10.3)
Chloride: 107 mmol/L (ref 101–111)
GFR calc Af Amer: >60 mL/min (ref >60)
GLUCOSE: 76 mg/dL (ref 65–99)
POTASSIUM: 5 mmol/L (ref 3.5–5.1)
Sodium: 141 mmol/L (ref 135–145)
TOTAL PROTEIN: 6.6 g/dL (ref 6.5–8.1)
Total Bilirubin: 1 mg/dL (ref 0.3–1.2)

## 2014-11-09 LAB — BLOOD GAS, ARTERIAL
Acid-base deficit: 4.6 mmol/L — ABNORMAL HIGH (ref 0.0–2.0)
Allens test (pass/fail): POSITIVE — AB
Bicarbonate: 19.4 mEq/L — ABNORMAL LOW (ref 21.0–28.0)
FIO2: 0.28 %
O2 Saturation: 96.8 %
PCO2 ART: 32 mmHg (ref 32.0–48.0)
PH ART: 7.39 (ref 7.350–7.450)
PO2 ART: 89 mmHg (ref 83.0–108.0)
Patient temperature: 37

## 2014-11-09 LAB — BASIC METABOLIC PANEL
Anion gap: 10 (ref 5–15)
BUN: 23 mg/dL — AB (ref 6–20)
CO2: 20 mmol/L — AB (ref 22–32)
Calcium: 7.9 mg/dL — ABNORMAL LOW (ref 8.9–10.3)
Chloride: 110 mmol/L (ref 101–111)
Creatinine, Ser: 1.09 mg/dL (ref 0.61–1.24)
GFR calc Af Amer: >60 mL/min (ref >60)
GFR calc non Af Amer: >60 mL/min (ref >60)
Glucose, Bld: 71 mg/dL (ref 65–99)
Potassium: 4.5 mmol/L (ref 3.5–5.1)
SODIUM: 140 mmol/L (ref 135–145)

## 2014-11-09 LAB — URINALYSIS COMPLETE WITH MICROSCOPIC (ARMC ONLY)
BACTERIA UA: NONE SEEN
BILIRUBIN URINE: NEGATIVE
Glucose, UA: 50 mg/dL — AB
LEUKOCYTES UA: NEGATIVE
Nitrite: NEGATIVE
PH: 5 (ref 5.0–8.0)
Protein, ur: 30 mg/dL — AB
SQUAMOUS EPITHELIAL / LPF: NONE SEEN
Specific Gravity, Urine: 1.016 (ref 1.005–1.030)

## 2014-11-09 LAB — LIPASE, BLOOD: Lipase: 18 U/L — ABNORMAL LOW (ref 22–51)

## 2014-11-09 LAB — PROTIME-INR
INR: 1.17
Prothrombin Time: 15.1 seconds — ABNORMAL HIGH (ref 11.4–15.0)

## 2014-11-09 LAB — LACTIC ACID, PLASMA
LACTIC ACID, VENOUS: 2.9 mmol/L — AB (ref 0.5–2.0)
Lactic Acid, Venous: 4.2 mmol/L (ref 0.5–2.0)
Lactic Acid, Venous: 2.2 mmol/L (ref 0.5–2.0)

## 2014-11-09 LAB — CBC
HEMATOCRIT: 24.9 % — AB (ref 40.0–52.0)
Hemoglobin: 8.4 g/dL — ABNORMAL LOW (ref 13.0–18.0)
MCH: 28.3 pg (ref 26.0–34.0)
MCHC: 33.9 g/dL (ref 32.0–36.0)
MCV: 83.5 fL (ref 80.0–100.0)
Platelets: 110 10*3/uL — ABNORMAL LOW (ref 150–440)
RBC: 2.98 MIL/uL — AB (ref 4.40–5.90)
RDW: 20.3 % — ABNORMAL HIGH (ref 11.5–14.5)
WBC: 7 10*3/uL (ref 3.8–10.6)

## 2014-11-09 LAB — MAGNESIUM: Magnesium: 2.2 mg/dL (ref 1.7–2.4)

## 2014-11-09 LAB — GLUCOSE, CAPILLARY
GLUCOSE-CAPILLARY: 67 mg/dL (ref 65–99)
GLUCOSE-CAPILLARY: 72 mg/dL (ref 65–99)

## 2014-11-09 LAB — TROPONIN I: TROPONIN I: 0.07 ng/mL — AB (ref <0.031)

## 2014-11-09 LAB — URIC ACID
URIC ACID, SERUM: 7.6 mg/dL (ref 4.4–7.6)
Uric Acid, Serum: 6.5 mg/dL (ref 4.4–7.6)

## 2014-11-09 LAB — APTT: APTT: 26 s (ref 24–36)

## 2014-11-09 LAB — PHOSPHORUS: PHOSPHORUS: 2.9 mg/dL (ref 2.5–4.6)

## 2014-11-09 MED ORDER — SODIUM CHLORIDE 0.9 % IV BOLUS (SEPSIS)
1000.0000 mL | Freq: Once | INTRAVENOUS | Status: AC
  Administered 2014-11-09: 1000 mL via INTRAVENOUS

## 2014-11-09 MED ORDER — OXYCODONE-ACETAMINOPHEN 5-325 MG PO TABS: 1.0000 | ORAL_TABLET | ORAL | Status: DC | PRN

## 2014-11-09 MED ORDER — ASPIRIN EC 81 MG PO TBEC: 81.0000 mg | DELAYED_RELEASE_TABLET | Freq: Every day | ORAL | Status: DC

## 2014-11-09 MED ORDER — HYDROMORPHONE HCL 1 MG/ML IJ SOLN
0.5000 mg | Freq: Once | INTRAMUSCULAR | Status: AC
  Administered 2014-11-09: 0.5 mg via INTRAVENOUS

## 2014-11-09 MED ORDER — PIPERACILLIN-TAZOBACTAM 3.375 G IVPB
3.3750 g | Freq: Three times a day (TID) | INTRAVENOUS | Status: AC
  Administered 2014-11-09 – 2014-11-19 (×31): 3.375 g via INTRAVENOUS
  Filled 2014-11-09 (×37): qty 50

## 2014-11-09 MED ORDER — CETYLPYRIDINIUM CHLORIDE 0.05 % MT LIQD
7.0000 mL | Freq: Two times a day (BID) | OROMUCOSAL | Status: DC
  Administered 2014-11-12 – 2014-11-19 (×12): 7 mL via OROMUCOSAL

## 2014-11-09 MED ORDER — IOHEXOL 300 MG/ML  SOLN
100.0000 mL | Freq: Once | INTRAMUSCULAR | Status: AC | PRN
  Administered 2014-11-09: 100 mL via INTRAVENOUS

## 2014-11-09 MED ORDER — ACETAMINOPHEN 325 MG PO TABS: 650.0000 mg | ORAL_TABLET | Freq: Four times a day (QID) | ORAL | Status: DC | PRN

## 2014-11-09 MED ORDER — SODIUM CHLORIDE 0.9 % IV SOLN
INTRAVENOUS | Status: DC
  Administered 2014-11-09: 05:00:00 via INTRAVENOUS

## 2014-11-09 MED ORDER — LORAZEPAM 2 MG/ML IJ SOLN
INTRAMUSCULAR | Status: AC
  Administered 2014-11-09: 2 mg via INTRAVENOUS
  Filled 2014-11-09: qty 1

## 2014-11-09 MED ORDER — HYDROMORPHONE HCL 1 MG/ML IJ SOLN
INTRAMUSCULAR | Status: AC
  Administered 2014-11-09: 1 mg via INTRAVENOUS
  Filled 2014-11-09: qty 1

## 2014-11-09 MED ORDER — HYDROMORPHONE HCL 1 MG/ML IJ SOLN
0.5000 mg | INTRAMUSCULAR | Status: DC | PRN
  Administered 2014-11-09: 0.5 mg via INTRAVENOUS
  Filled 2014-11-09: qty 1

## 2014-11-09 MED ORDER — IPRATROPIUM BROMIDE 0.02 % IN SOLN
0.5000 mg | Freq: Four times a day (QID) | RESPIRATORY_TRACT | Status: DC | PRN
  Administered 2014-11-15 – 2014-11-17 (×3): 0.5 mg via RESPIRATORY_TRACT
  Filled 2014-11-09 (×3): qty 2.5

## 2014-11-09 MED ORDER — ACETAMINOPHEN 500 MG PO TABS: 1000.0000 mg | ORAL_TABLET | ORAL | Status: DC

## 2014-11-09 MED ORDER — HYDROMORPHONE HCL 1 MG/ML IJ SOLN
0.5000 mg | INTRAMUSCULAR | Status: DC | PRN
  Administered 2014-11-09 – 2014-11-12 (×7): 1 mg via INTRAVENOUS
  Administered 2014-11-15: 02:00:00 0.5 mg via INTRAVENOUS
  Administered 2014-11-16: 1 mg via INTRAVENOUS
  Filled 2014-11-09 (×9): qty 1

## 2014-11-09 MED ORDER — ONDANSETRON HCL 4 MG PO TABS: 4.0000 mg | ORAL_TABLET | Freq: Four times a day (QID) | ORAL | Status: DC | PRN

## 2014-11-09 MED ORDER — OXYBUTYNIN CHLORIDE 5 MG PO TABS
5.0000 mg | ORAL_TABLET | Freq: Three times a day (TID) | ORAL | Status: DC | PRN
Start: 2014-11-09 — End: 2014-11-10

## 2014-11-09 MED ORDER — HYDROMORPHONE HCL 1 MG/ML IJ SOLN
0.5000 mg | INTRAMUSCULAR | Status: AC
  Administered 2014-11-09: 0.5 mg via INTRAVENOUS

## 2014-11-09 MED ORDER — VANCOMYCIN HCL IN DEXTROSE 1-5 GM/200ML-% IV SOLN
1000.0000 mg | Freq: Two times a day (BID) | INTRAVENOUS | Status: DC
  Administered 2014-11-09 – 2014-11-12 (×6): 1000 mg via INTRAVENOUS
  Filled 2014-11-09 (×9): qty 200

## 2014-11-09 MED ORDER — HYDROMORPHONE HCL 1 MG/ML IJ SOLN: 0.5000 mg | INTRAMUSCULAR | Status: DC | PRN

## 2014-11-09 MED ORDER — IOHEXOL 240 MG/ML SOLN
25.0000 mL | Freq: Once | INTRAMUSCULAR | Status: AC | PRN
  Administered 2014-11-09: 25 mL via ORAL

## 2014-11-09 MED ORDER — DOCUSATE SODIUM 100 MG PO CAPS
100.0000 mg | ORAL_CAPSULE | Freq: Two times a day (BID) | ORAL | Status: DC
  Filled 2014-11-09: qty 1

## 2014-11-09 MED ORDER — HYDROMORPHONE HCL 1 MG/ML IJ SOLN
INTRAMUSCULAR | Status: AC
  Administered 2014-11-09: 0.5 mg via INTRAVENOUS
  Filled 2014-11-09: qty 1

## 2014-11-09 MED ORDER — ENSURE ENLIVE PO LIQD: 237.0000 mL | Freq: Three times a day (TID) | ORAL | Status: DC

## 2014-11-09 MED ORDER — LORAZEPAM 2 MG/ML IJ SOLN: 1.0000 mg | Freq: Once | INTRAMUSCULAR | Status: DC

## 2014-11-09 MED ORDER — FOLIC ACID 1 MG PO TABS: 1.0000 mg | ORAL_TABLET | Freq: Every day | ORAL | Status: DC

## 2014-11-09 MED ORDER — SPIRONOLACTONE 25 MG PO TABS: 25.0000 mg | ORAL_TABLET | Freq: Every day | ORAL | Status: DC

## 2014-11-09 MED ORDER — CHLORHEXIDINE GLUCONATE 0.12 % MT SOLN
15.0000 mL | Freq: Two times a day (BID) | OROMUCOSAL | Status: DC
  Administered 2014-11-09 – 2014-11-19 (×18): 15 mL via OROMUCOSAL

## 2014-11-09 MED ORDER — MAGNESIUM OXIDE 400 (241.3 MG) MG PO TABS
400.0000 mg | ORAL_TABLET | Freq: Two times a day (BID) | ORAL | Status: DC
  Filled 2014-11-09: qty 1

## 2014-11-09 MED ORDER — ACETAMINOPHEN 650 MG RE SUPP
RECTAL | Status: AC
  Filled 2014-11-09: qty 2

## 2014-11-09 MED ORDER — ONDANSETRON HCL 4 MG/2ML IJ SOLN
4.0000 mg | Freq: Four times a day (QID) | INTRAMUSCULAR | Status: DC | PRN
Start: 2014-11-09 — End: 2014-11-19

## 2014-11-09 MED ORDER — HYDROMORPHONE HCL 1 MG/ML IJ SOLN
1.0000 mg | Freq: Four times a day (QID) | INTRAMUSCULAR | Status: DC | PRN
  Administered 2014-11-09: 1 mg via INTRAVENOUS

## 2014-11-09 MED ORDER — ACETAMINOPHEN 500 MG PO TABS
ORAL_TABLET | ORAL | Status: AC
  Filled 2014-11-09: qty 2

## 2014-11-09 MED ORDER — AMLODIPINE BESYLATE 5 MG PO TABS: 5.0000 mg | ORAL_TABLET | Freq: Every day | ORAL | Status: DC

## 2014-11-09 MED ORDER — LORAZEPAM 2 MG/ML IJ SOLN
1.0000 mg | Freq: Four times a day (QID) | INTRAMUSCULAR | Status: DC | PRN
  Administered 2014-11-09 – 2014-11-18 (×10): 1 mg via INTRAVENOUS
  Filled 2014-11-09 (×10): qty 1

## 2014-11-09 MED ORDER — HEPARIN SODIUM (PORCINE) 5000 UNIT/ML IJ SOLN
5000.0000 [IU] | Freq: Three times a day (TID) | INTRAMUSCULAR | Status: DC
  Administered 2014-11-09 – 2014-11-10 (×5): 5000 [IU] via SUBCUTANEOUS
  Filled 2014-11-09 (×7): qty 1

## 2014-11-09 MED ORDER — IPRATROPIUM BROMIDE 0.02 % IN SOLN
0.5000 mg | Freq: Four times a day (QID) | RESPIRATORY_TRACT | Status: DC
  Administered 2014-11-09 (×2): 0.5 mg via RESPIRATORY_TRACT
  Filled 2014-11-09 (×3): qty 2.5

## 2014-11-09 MED ORDER — METOPROLOL TARTRATE 25 MG PO TABS: 37.5000 mg | ORAL_TABLET | Freq: Two times a day (BID) | ORAL | Status: DC

## 2014-11-09 MED ORDER — HYDROMORPHONE HCL 1 MG/ML IJ SOLN: 1.0000 mg | Freq: Once | INTRAMUSCULAR | Status: DC

## 2014-11-09 MED ORDER — METOPROLOL TARTRATE 1 MG/ML IV SOLN
2.5000 mg | Freq: Four times a day (QID) | INTRAVENOUS | Status: DC
  Administered 2014-11-09 – 2014-11-10 (×4): 2.5 mg via INTRAVENOUS
  Filled 2014-11-09 (×4): qty 5

## 2014-11-09 MED ORDER — ALLOPURINOL 300 MG PO TABS: 300.0000 mg | ORAL_TABLET | Freq: Every day | ORAL | Status: DC

## 2014-11-09 MED ORDER — HYDROMORPHONE HCL 1 MG/ML IJ SOLN
1.0000 mg | INTRAMUSCULAR | Status: AC
  Administered 2014-11-08: 1 mg via INTRAVENOUS

## 2014-11-09 MED ORDER — POTASSIUM CHLORIDE CRYS ER 10 MEQ PO TBCR: 10.0000 meq | EXTENDED_RELEASE_TABLET | Freq: Every day | ORAL | Status: DC

## 2014-11-09 MED ORDER — LORAZEPAM 2 MG/ML IJ SOLN
2.0000 mg | Freq: Once | INTRAMUSCULAR | Status: AC
  Administered 2014-11-09: 2 mg via INTRAVENOUS

## 2014-11-09 MED ORDER — ACETAMINOPHEN 650 MG RE SUPP
650.0000 mg | Freq: Four times a day (QID) | RECTAL | Status: DC | PRN
  Administered 2014-11-09: 650 mg via RECTAL
  Filled 2014-11-09: qty 1

## 2014-11-09 NOTE — ED Notes (Signed)
[facility]: lactate 2.9

## 2014-11-09 NOTE — Progress Notes (Signed)
Pt restless, agitated, and in fetal position.  Patient not alert and oriented but able to localize pain.  Dr. Tresa Moore at bedside updating family.  No IV PRN orders.  Order obtained for One time order for pain medication.

## 2014-11-09 NOTE — Progress Notes (Signed)
Pt resting comfortably at this time, pain reassessed post 1mg  Dilaudid and pt was sleeping discussed plan of care with family to alternate between dilaudid and ativan for pt comfort. Pt started to arouse at 2255 with signs of discomfort ativan 1 mg given and patient resting comfortably at this time. Family expressed confidence in this plan of care rather than pca.

## 2014-11-09 NOTE — Progress Notes (Signed)
Patient continues to call out with abdominal pain after multiple doses of pain medication.  Patient is confused and hallucinating.  Dr. Tressia Miners called and notified.  Orders received.

## 2014-11-09 NOTE — Consult Note (Signed)
[place]  [phone]) [phone]  Fax:([phone]) [place]: 08/24/1945  MR#: [id number]  ETK#:[id number]  Patient Care Team: No Pcp Per Patient as PCP - General (General Practice)  CHIEF COMPLAINT:  Chief Complaint  Patient presents with  . Extremity Pain    INTERVAL HISTORY: Caleb Francis is a 69 y.o. male with progressive mantle cell lymphoma who was admitted with intractable pain. Also with AMS today, hallucinations, delirium. His nurse reports that his pain is better controlled now but this morning he was in severe distress. He has recently had a CT scan of head to evaluate AMS. [place] this morning was not completed due to his severe pain. He is lying in bed currently sedated, received lorazepam due to agitation. Is on IV hydromorphone for pain PRN.  REVIEW OF SYSTEMS:   Review of Systems  Unable to perform ROS   As per HPI. Otherwise, a complete review of systems is negative.  ONCOLOGY HISTORY:  No history exists.    PAST MEDICAL HISTORY: Past Medical History  Diagnosis Date  . Arthritis   . Hypertension   . RA (rheumatoid arthritis)   . Anemia   . Cancer     lymphoma  . History of nuclear stress test     a. 12/2013: low risk, no sig ischemia, no EKG changes, no artifact, EF 63%  . Chronic kidney disease     PAST SURGICAL HISTORY: Past Surgical History  Procedure Laterality Date  . Back surgery    . Fracture surgery      ankle  . Portacath placement    . Cystoscopy w/ ureteral stent placement Left 10/24/2014    Procedure: CYSTOSCOPY WITH RETROGRADE PYELOGRAM/URETERAL STENT PLACEMENT;  Surgeon: Irine Seal, MD;  Location: [facility] ORS;  Service: Urology;  Laterality: Left;    FAMILY HISTORY Family History  Problem Relation Age of Onset  . Cancer Mother     breast  . Cancer Father     bone cancer    GYNECOLOGIC HISTORY:  No LMP for male patient.     ADVANCED DIRECTIVES:    HEALTH MAINTENANCE: History  Substance  Use Topics  . Smoking status: Former Smoker -- 0.50 packs/day for 60 years    Types: Cigarettes    Quit date: 10/18/2014  . Smokeless tobacco: Not on file  . Alcohol Use: No     Colonoscopy:  PAP:  Bone density:  Lipid panel:  No Known Allergies  Current Facility-Administered Medications  Medication Dose Route Frequency Provider Last Rate Last Dose  . 0.9 %  sodium chloride infusion   Intravenous Continuous Juluis Mire, MD 75 mL/hr at 11/09/14 0600    . acetaminophen (TYLENOL) suppository 650 mg  650 mg Rectal Q6H PRN Gladstone Lighter, MD   650 mg at 11/09/14 1008  . acetaminophen (TYLENOL) tablet 650 mg  650 mg Oral Q6H PRN Juluis Mire, MD      . allopurinol (ZYLOPRIM) tablet 300 mg  300 mg Oral Daily Juluis Mire, MD   300 mg at 11/09/14 2575  . amLODipine (NORVASC) tablet 5 mg  5 mg Oral Daily Juluis Mire, MD   5 mg at 11/09/14 0518  . antiseptic oral rinse (CPC / CETYLPYRIDINIUM CHLORIDE 0.05%) solution 7 mL  7 mL Mouth Rinse q12n4p Gladstone Lighter, MD   7 mL at 11/09/14 1200  . aspirin EC tablet 81 mg  81 mg Oral Daily Juluis Mire, MD  81 mg at 11/09/14 0952  . chlorhexidine (PERIDEX) 0.12 % solution 15 mL  15 mL Mouth Rinse BID Gladstone Lighter, MD   15 mL at 11/09/14 1012  . docusate sodium (COLACE) capsule 100 mg  100 mg Oral BID Juluis Mire, MD   100 mg at 11/09/14 0953  . feeding supplement (ENSURE ENLIVE) (ENSURE ENLIVE) liquid 237 mL  237 mL Oral TID BM Juluis Mire, MD   237 mL at 11/09/14 0953  . folic acid (FOLVITE) tablet 1 mg  1 mg Oral Daily Juluis Mire, MD   1 mg at 11/09/14 0953  . heparin injection 5,000 Units  5,000 Units Subcutaneous 3 times per day Juluis Mire, MD   5,000 Units at 11/09/14 1328  . HYDROmorphone (DILAUDID) injection 0.5-1 mg  0.5-1 mg Intravenous Q4H PRN Gladstone Lighter, MD      . ipratropium (ATROVENT) nebulizer solution 0.5 mg  0.5 mg Nebulization Q6H Juluis Mire, MD   0.5 mg at 11/09/14  1332  . magnesium oxide (MAG-OX) tablet 400 mg  400 mg Oral BID Juluis Mire, MD   400 mg at 11/09/14 0953  . metoprolol (LOPRESSOR) injection 2.5 mg  2.5 mg Intravenous 4 times per day Gladstone Lighter, MD   2.5 mg at 11/09/14 1329  . ondansetron (ZOFRAN) tablet 4 mg  4 mg Oral Q6H PRN Juluis Mire, MD       Or  . ondansetron [facility]) injection 4 mg  4 mg Intravenous Q6H PRN Juluis Mire, MD      . oxybutynin (DITROPAN) tablet 5 mg  5 mg Oral Q8H PRN Juluis Mire, MD      . oxyCODONE-acetaminophen (PERCOCET/ROXICET) 5-325 MG per tablet 1-2 tablet  1-2 tablet Oral Q4H PRN Juluis Mire, MD      . piperacillin-tazobactam (ZOSYN) IVPB 3.375 g  3.375 g Intravenous 3 times per day Vira Blanco, RPH   3.375 g at 11/09/14 0557  . potassium chloride (K-DUR,KLOR-CON) CR tablet 10 mEq  10 mEq Oral Daily Juluis Mire, MD   10 mEq at 11/09/14 0954  . spironolactone (ALDACTONE) tablet 25 mg  25 mg Oral Daily Juluis Mire, MD   25 mg at 11/09/14 0954  . vancomycin (VANCOCIN) IVPB 1000 mg/200 mL premix  1,000 mg Intravenous Q12H Vira Blanco, RPH   1,000 mg at 11/09/14 1328   Facility-Administered Medications Ordered in Other Encounters  Medication Dose Route Frequency Provider Last Rate Last Dose  . 0.9 %  sodium chloride infusion   Intravenous Continuous Lequita Asal, MD 20 mL/hr at 11/06/14 1010 20 mL/hr at 11/06/14 1010  . morphine 2 MG/ML injection 2 mg  2 mg Intravenous Q2H PRN Lequita Asal, MD   2 mg at 11/05/14 1422  . sodium chloride 0.9 % injection 10 mL  10 mL Intracatheter PRN Lequita Asal, MD   10 mL at 11/06/14 1011    OBJECTIVE: BP 140/80 mmHg  Pulse 124  Temp(Src) 100.3 F (37.9 C) (Rectal)  Resp 25  Ht 6\' 1"  (1.854 m)  Wt 176 lb 2.4 oz (79.9 kg)  BMI 23.24 kg/m2  SpO2 98%   Body mass index is 23.24 kg/(m^2).    ECOG FS:4 - Bedbound  General: Sedated currently lying in bed.  HEENT: Normocephalic, moist mucous membranes, clear  oropharynx. Lungs: Coarse breath sounds in upper lobes. Currently receiving SVN treatment. Heart: Tachycardic. Abdomen: Soft, tender. Musculoskeletal: No edema, cyanosis,  or clubbing. Neuro: Cranial nerves grossly intact. Currently sedated.  Skin: No rashes or petechiae noted. Lymphatics: Diffuse lymphadenopathy.  LAB RESULTS:     Component Value Date/Time   NA 140 11/09/2014 0559   NA 141 07/17/2014 1121   K 4.5 11/09/2014 0559   K 2.8* 07/17/2014 1121   CL 110 11/09/2014 0559   CL 100 07/17/2014 1121   CO2 20* 11/09/2014 0559   CO2 32 07/17/2014 1121   GLUCOSE 71 11/09/2014 0559   GLUCOSE 105* 07/17/2014 1121   BUN 23* 11/09/2014 0559   BUN 9 07/17/2014 1121   CREATININE 1.09 11/09/2014 0559   CREATININE 1.09 07/17/2014 1121   CALCIUM 7.9* 11/09/2014 0559   CALCIUM 8.9 07/17/2014 1121   PROT 6.6 11/08/2014 2221   PROT 7.3 07/17/2014 1121   ALBUMIN 2.6* 11/08/2014 2221   ALBUMIN 3.1* 07/17/2014 1121   AST 38 11/08/2014 2221   AST 17 07/17/2014 1121   ALT 29 11/08/2014 2221   ALT 12* 07/17/2014 1121   ALKPHOS 194* 11/08/2014 2221   ALKPHOS 79 07/17/2014 1121   BILITOT 1.0 11/08/2014 2221   GFRNONAA >60 11/09/2014 0559   GFRNONAA >60 02/11/2014 0851   GFRAA >60 11/09/2014 0559   GFRAA >60 02/11/2014 0851    No results found for: SPEP, UPEP  Lab Results  Component Value Date   WBC 7.0 11/09/2014   NEUTROABS 7.8* 11/08/2014   HGB 8.4* 11/09/2014   HCT 24.9* 11/09/2014   MCV 83.5 11/09/2014   PLT 110* 11/09/2014    @LASTCHEMISTRY @  No results found for: LABCA2  No components found for: LABCA125   Recent Labs Lab 11/08/14 2221  INR 1.17       Component Value Date/Time   COLORURINE YELLOW* 11/09/2014 0230   APPEARANCEUR CLEAR* 11/09/2014 0230   LABSPEC 1.016 11/09/2014 0230   PHURINE 5.0 11/09/2014 0230   GLUCOSEU 50* 11/09/2014 0230   HGBUR 2+* 11/09/2014 0230   BILIRUBINUR NEGATIVE 11/09/2014 0230   KETONESUR TRACE* 11/09/2014 0230    PROTEINUR 30* 11/09/2014 0230   NITRITE NEGATIVE 11/09/2014 0230   LEUKOCYTESUR NEGATIVE 11/09/2014 0230    STUDIES: Ct Head Wo Contrast  11/09/2014   CLINICAL DATA:  Degenerating mental status, sepsis and history of progressive mantle cell lymphoma.  EXAM: CT HEAD WITHOUT CONTRAST  TECHNIQUE: Contiguous axial images were obtained from the base of the skull through the vertex without intravenous contrast.  COMPARISON:  05/21/2009  FINDINGS: No findings by unenhanced head CT to suggest brain involvement by lymphoma. The brain demonstrates no evidence of hemorrhage, infarction, edema, mass effect, extra-axial fluid collection, hydrocephalus or mass lesion. The skull is unremarkable.  IMPRESSION: Unremarkable head CT.   Electronically Signed   By: Aletta Edouard M.D.   On: 11/09/2014 13:30   Ct Angio Chest Pe W/cm &/or Wo Cm  10/30/2014   CLINICAL DATA:  Tachycardia.  History of mantle cell lymphoma.  EXAM: CT ANGIOGRAPHY CHEST WITH CONTRAST  TECHNIQUE: Multidetector CT imaging of the chest was performed using the standard protocol during bolus administration of intravenous contrast. Multiplanar CT image reconstructions and MIPs were obtained to evaluate the vascular anatomy.  CONTRAST:  25mL OMNIPAQUE IOHEXOL 350 MG/ML SOLN  COMPARISON:  Head CT 09/16/2014 and chest CT 11/12/2013  FINDINGS: No evidence for pulmonary embolism.  There is a right jugular Port-A-Cath with the tip in the SVC. There are coronary artery calcifications. No significant pericardial or pleural fluid.  Compared to the prior PET-CT, there has been marked enlargement  of the left axillary lymphadenopathy. Index lymph node in the left axilla measures 3.0 cm in the short axis on sequence 4, image 44 and previously measured 1.4 cm. There are also enlarged lymph nodes throughout the left sub pectoralis region. Second index lymph node in the left axilla measures 3.2 cm on sequence 4, image 30 and previously measured 1.7 cm in the short axis.  Again noted are prominent lymph nodes in the right axilla but these have minimally changed compared to the left side. Small mediastinal lymph nodes have minimally changed. Again noted is adenopathy in the upper abdomen which is incompletely imaged.  The trachea and mainstem bronchi are patent. There are new or increased patchy densities at both lung bases. Some of this could represent atelectasis but cannot exclude an infectious or inflammatory process, particularly in the right lower lobe. Again noted are subtle nodular densities in the right middle lobe on sequence 6, image 104. Again noted is a small irregular nodule in the right middle lobe on sequence 6, image 91 measuring roughly 6 mm. Mild paraseptal emphysema at the lung apices with mild apical scarring. Again noted are small pleural-based nodular densities in left upper lung, best seen on sequence 6, image 24.  No acute bone abnormality. Again noted is a mild compression deformity involving the T9 vertebral body but this is unchanged from 05/24/2014.  Review of the MIP images confirms the above findings.  IMPRESSION: Negative for pulmonary embolism.  Patchy parenchymal densities in the lower lobes, right side greater the left. Findings could be associated with atelectasis but there may be an infectious or inflammatory process, particularly in the right lower lobe.  Interval enlargement of the lymphadenopathy in the left axilla and left sub pectoralis region. Findings are compatible with progression of the lymphoma.  Few small pulmonary nodules, particularly in the right middle lobe. Recommend attention to these nodules on follow up imaging.   Electronically Signed   By: Markus Daft M.D.   On: 10/30/2014 16:30   Ct Abdomen Pelvis W Contrast  11/09/2014   CLINICAL DATA:  Diffuse abdominal pain.  Chemotherapy yesterday.  EXAM: CT ABDOMEN AND PELVIS WITH CONTRAST  TECHNIQUE: Multidetector CT imaging of the abdomen and pelvis was performed using the standard  protocol following bolus administration of intravenous contrast.  CONTRAST:  127mL OMNIPAQUE IOHEXOL 300 MG/ML  SOLN  COMPARISON:  10/23/2014  FINDINGS: BODY WALL: Partly visible left lower axillary lymphadenopathy, recently evaluated by chest CT 10/30/2014.  There is bulky bilateral inguinal lymphadenopathy which has increased from 10/23/2014, with left inguinal node on image 88 measuring 31 mm short axis, previously 23 mm  LOWER CHEST: Reticular opacities in the lower lungs, greater on the right, is favored atelectasis. No definitive pneumonia. Lower thoracic lymphadenopathy has increased, with a left retrocrural lymph node currently 15 mm on image 23, previously 10 mm.  ABDOMEN/PELVIS:  Liver: No focal abnormality.  Biliary: Cholelithiasis. The gallbladder is distended but there is no inflammatory wall thickening.  Pancreas: Unremarkable.  Spleen: Mild splenomegaly, 15 cm in craniocaudal span. This is increased by approximately 4 cm since previous.  Adrenals: Negative.  Kidneys and ureters: Moderate bilateral hydroureteronephrosis to the level of the pelvis. This is despite an internal ureteral stent on the left which is well positioned. Symmetric renal enhancement.  Bladder: Thickening of the bladder wall, preferentially to the left, stable from previous. This is indeterminate and will be assessed on followup imaging.  Reproductive: No pathologic findings.  Bowel: No obstruction. No inflammatory  bowel wall thickening  Retroperitoneum: There is bulky and diffuse retroperitoneal lymphadenopathy. There has been significant and rapid increased from previous. Index left periaortic lymph node on image 39 measures 88 x 69 mm (previously 59 x 35 mm) on image 39). Left pelvic sidewall lymph node that was previously measured is on image 71 and stable at 58 x 41 mm. There is new enlargement of upper abdominal retroperitoneal lymph nodes . Some cystic change, presumably treatment related, present in the largest nodes,  especially in the left pelvis.  Vascular: Mass effect on the left iliac veins by lymphadenopathy, with progressive subcutaneous edema in the left upper thigh.  OSSEOUS: No acute abnormalities.  IMPRESSION: 1. Rapid progression of lymphoma with marked enlargement of lymph nodes compared to 10/23/2014. 2. Bilateral hydroureteronephrosis, new on the right and unchanged on the left (despite a well-positioned left internal ureteral stent). 3. Progressive edema in the upper left thigh. Given severe compression of the left iliac vein by lymphadenopathy, consider Doppler to evaluate for DVT.   Electronically Signed   By: Monte Fantasia M.D.   On: 11/09/2014 02:13   Ct Abdomen Pelvis W Contrast  10/23/2014   CLINICAL DATA:  Swelling to the left upper thigh since last night. Left lower abdominal pain. History of lymphoma with chemotherapy 2015.  EXAM: CT ABDOMEN AND PELVIS WITH CONTRAST  TECHNIQUE: Multidetector CT imaging of the abdomen and pelvis was performed using the standard protocol following bolus administration of intravenous contrast.  CONTRAST:  176mL OMNIPAQUE IOHEXOL 300 MG/ML SOLN, 66mL OMNIPAQUE IOHEXOL 240 MG/ML SOLN  COMPARISON:  PET-CT scan 09/16/2014  FINDINGS: Dependent atelectasis in the lung bases.  Cholelithiasis with multiple stones in the gallbladder. No gallbladder wall thickening or infiltration. Multiple accessory spleens. Liver, spleen, pancreas, adrenal glands, and inferior vena cava are unremarkable. Calcification of the abdominal aorta without aneurysm. There is fairly prominent retroperitoneal and periaortic lymphadenopathy extending all the way down into the pelvis with diffuse bilateral iliac chain lymphadenopathy, greater on the left. Left periaortic lymph nodes measure up to 5.9 by 3.5 cm diameter. Left pelvic lymph nodes measure up to 5 by 4.2 cm diameter. Scattered lymph nodes in the celiac axis are upper limits of normal in size. Lymphadenopathy demonstrated in the left groin.  Diffuse soft tissue edema in the left upper leg without discrete fluid collection. There is hydronephrosis and hydroureter on the left probably due to extrinsic compression from left pelvic lymphadenopathy. Lymph nodes in all areas demonstrate enlargement since the previous PET-CT scan. Findings suggest progression of lymphoma.  Stomach, small bowel, and colon are not abnormally distended. No free air or free fluid in the abdomen. Abdominal wall musculature appears intact.  Pelvis: Prostate gland is not enlarged. Bladder wall is mildly thickened, possibly indicating infection. Appendix is normal. No evidence of diverticulitis. Degenerative changes of the spine. No destructive bone lesions.  IMPRESSION: Significant interval enlargement of diffuse retroperitoneal, bilateral pelvic, and left groin lymph nodes. Lymphadenopathy is most prominent on the left. Diffuse edema demonstrated in the left upper leg. Left renal hydronephrosis and hydroureter probably due to extrinsic compression by lymphadenopathy. Findings suggest progression of lymphoma.   Electronically Signed   By: Lucienne Capers M.D.   On: 10/23/2014 01:29   US Venous Img Lower Unilateral Left  10/22/2014   CLINICAL DATA:  Left leg swelling for 2 days. Patient is on aspirin.  EXAM: Left LOWER EXTREMITY VENOUS DOPPLER ULTRASOUND  TECHNIQUE: Gray-scale sonography with graded compression, as well as color Doppler and duplex  ultrasound were performed to evaluate the lower extremity deep venous systems from the level of the common femoral vein and including the common femoral, femoral, profunda femoral, popliteal and calf veins including the posterior tibial, peroneal and gastrocnemius veins when visible. The superficial great saphenous vein was also interrogated. Spectral Doppler was utilized to evaluate flow at rest and with distal augmentation maneuvers in the common femoral, femoral and popliteal veins.  COMPARISON:  None.  FINDINGS: Contralateral Common  Femoral Vein: Respiratory phasicity is normal and symmetric with the symptomatic side. No evidence of thrombus. Normal compressibility.  Common Femoral Vein: No evidence of thrombus. Normal compressibility, respiratory phasicity and response to augmentation.  Saphenofemoral Junction: No evidence of thrombus. Normal compressibility and flow on color Doppler imaging.  Profunda Femoral Vein: No evidence of thrombus. Normal compressibility and flow on color Doppler imaging.  Femoral Vein: No evidence of thrombus. Normal compressibility, respiratory phasicity and response to augmentation.  Popliteal Vein: No evidence of thrombus. Normal compressibility, respiratory phasicity and response to augmentation.  Calf Veins: Peroneal vein is not visualized. Visualized posterior tibial vein demonstrates no evidence of thrombus. Normal compressibility and flow on color Doppler imaging.  Superficial Great Saphenous Vein: No evidence of thrombus. Normal compressibility and flow on color Doppler imaging.  Venous Reflux:  None.  Other Findings: Incidental note of prominent lymph nodes in the left groin area. Central flow and fatty hilum are demonstrated. This is likely reactive.  IMPRESSION: No evidence of deep venous thrombosis.   Electronically Signed   By: Lucienne Capers M.D.   On: 10/22/2014 22:45   [place] Extrem Low Left Comp  11/09/2014   CLINICAL DATA:  Left calf swelling and lower extremity pain. History of lymphoma.  EXAM: Left LOWER EXTREMITY VENOUS DOPPLER ULTRASOUND  TECHNIQUE: Gray-scale sonography with graded compression, as well as color Doppler and duplex ultrasound were performed to evaluate the lower extremity deep venous systems from the level of the common femoral vein and including the common femoral, femoral, profunda femoral, popliteal and calf veins including the posterior tibial, peroneal and gastrocnemius veins when visible. The superficial great saphenous vein was also interrogated. Spectral Doppler was  utilized to evaluate flow at rest and with distal augmentation maneuvers in the common femoral, femoral and popliteal veins.  COMPARISON:  None.  FINDINGS: Contralateral Common Femoral Vein: Respiratory phasicity is normal and symmetric with the symptomatic side. No evidence of thrombus. Normal compressibility.  Common Femoral Vein: No evidence of thrombus. Normal compressibility, respiratory phasicity and response to augmentation.  Saphenofemoral Junction - Superficial Great Saphenous Vein: Due to patient pain, the patient requested that the examination be terminated therefore the superficial femoral vein through popliteal trifurcation could not be evaluated.  IMPRESSION: No evidence of deep venous thrombosis involving the upper common femoral vein. The patient requested that the examination be terminated and did not allow the sonographer to examine the lower extremity venous system from the level of the saphenous femoral junction through the popliteal trifurcation.   Electronically Signed   By: Conchita Paris M.D.   On: 11/09/2014 09:40   Dg Chest Port 1 View  11/08/2014   CLINICAL DATA:  Acute onset of sepsis.  Initial encounter.  EXAM: PORTABLE CHEST - 1 VIEW  COMPARISON:  Chest radiograph performed 11/12/2013, and CTA of the chest performed 10/30/2014  FINDINGS: The lungs are well-aerated. Mild vascular congestion is noted, with mild right basilar atelectasis. There is no evidence of pleural effusion or pneumothorax.  The cardiomediastinal silhouette is within normal  limits. No acute osseous abnormalities are seen. A right-sided chest port is noted ending about the mid SVC.  IMPRESSION: Mild vascular congestion, with mild right basilar atelectasis. Lungs otherwise clear.   Electronically Signed   By: Garald Balding M.D.   On: 11/08/2014 23:06   Dg Abd 2 Views  10/25/2014   CLINICAL DATA:  Ureteral stent  EXAM: ABDOMEN - 2 VIEW  COMPARISON:  CT 10/23/2014  FINDINGS: Double-J left ureteral stent extends  from the left renal pelvis to the bladder in expected location. Oral contrast from recent CT noted within the colon. Mild bibasilar atelectasis.  IMPRESSION: Left ureteral stent in expected location.   Electronically Signed   By: Suzy Bouchard M.D.   On: 10/25/2014 13:42    ASSESSMENT:  Caleb Francis is a 69 y.o. male with progressive mantle cell lymphoma admitted with intractable abdominal pain, AMS, Sepsis and fever.  PLAN:  1. Hematology/Oncology: enlarging adenopathy on imaging.Completed first dose of RICE chemotherapy yesterday with pump removal in CCU after admission to hospital.On allopurinol for tumor lysis. Abdominal pain had continued to worsen with rapidly increasing disease.  Discussed with daughter overall poor prognosis. She is in agreement that her father would not want to resuscitated.  2.  Hydronephrosis: Now with bilateral hydronephrosis. Currently has a stent in place on right. Urology has been consulted.  3. Pain/Toxicology: Pain appears controlled at present time but has recently received ativan as well. Currently on Hydromorphone IV.  4.  Sepsis: Continue with current antibiotic coverage with vancomycin and zosyn.   Oncology will continue to follow.    Patient expressed understanding and was in agreement with this plan. He also understands that He can call clinic at any time with any questions, concerns, or complaints.    No matching staging information was found for the patient.  Evlyn Kanner, NP   11/09/2014 1:43 PM

## 2014-11-09 NOTE — Consult Note (Signed)
Reason for Consult:Malignant Hydronephrosis  Referring Physician: Delman Kitten MD  Caleb Francis is an 69 y.o. male.   HPI:   1 - Bilateral Malignant Hydronephrosis - New left hydro 10/24/14 from bulky retroperitneal adenopathy from rapidly progressive lymphoma. Cr normal. 6x26 ureteral stent placed by Dr. Jeffie Pollock 10/24/2014. New CT 11/09/14 with rapidly progressive adenoapthy and now new right sided hydro. Cr remains normal (1.2). He is chemo as recently as 5/26 for his lymphoma. No new right flank pain.   2 - Post Chemo Fever of Unknown Origin - Pr with fevers >101 5/28 prompting ER eval. Currently on chemo last dose 5/26 per report. UA without bacteruria. UCX pending.   3 - Abdominal Pain - Pt with severe non-localizing abdominal pain at presentation. NO frank CVAT or localizing pain on exam. Requiring narcotic pain meds round the clock.   PMH sig for mantle cell lymphoma, rheumatoid.   Today Caleb Francis is seen in consultation for above. He is referred by Dr. Jacqualine Code.   Past Medical History  Diagnosis Date  . Arthritis   . Hypertension   . RA (rheumatoid arthritis)   . Anemia   . Cancer     lymphoma  . History of nuclear stress test     a. 12/2013: low risk, no sig ischemia, no EKG changes, no artifact, EF 63%    Past Surgical History  Procedure Laterality Date  . Back surgery    . Fracture surgery      ankle  . Portacath placement    . Cystoscopy w/ ureteral stent placement Left 10/24/2014    Procedure: CYSTOSCOPY WITH RETROGRADE PYELOGRAM/URETERAL STENT PLACEMENT;  Surgeon: Irine Seal, MD;  Location: [facility] ORS;  Service: Urology;  Laterality: Left;    Family History  Problem Relation Age of Onset  . Cancer Mother     breast  . Cancer Father     bone cancer    Social History:  reports that he quit smoking about 3 weeks ago. His smoking use included Cigarettes. He has a 30 pack-year smoking history. He does not have any smokeless tobacco history on file. He reports that he  does not drink alcohol or use illicit drugs.  Allergies: No Known Allergies  Medications: I have reviewed the patient's current medications.  Results for orders placed or performed during the hospital encounter of 11/08/14 (from the past 48 hour(s))  APTT     Status: None   Collection Time: 11/08/14 10:21 PM  Result Value Ref Range   aPTT 26 24 - 36 seconds  CBC WITH DIFFERENTIAL     Status: Abnormal   Collection Time: 11/08/14 10:21 PM  Result Value Ref Range   WBC 8.6 3.8 - 10.6 K/uL   RBC 3.39 (L) 4.40 - 5.90 MIL/uL   Hemoglobin 9.4 (L) 13.0 - 18.0 g/dL   HCT 28.2 (L) 40.0 - 52.0 %   MCV 83.1 80.0 - 100.0 fL   MCH 27.7 26.0 - 34.0 pg   MCHC 33.4 32.0 - 36.0 g/dL   RDW 20.7 (H) 11.5 - 14.5 %   Platelets 133 (L) 150 - 440 K/uL   Neutrophils Relative % 90 %   Neutro Abs 7.8 (H) 1.4 - 6.5 K/uL   Lymphocytes Relative 5 %   Lymphs Abs 0.5 (L) 1.0 - 3.6 K/uL   Monocytes Relative 3 %   Monocytes Absolute 0.2 0.2 - 1.0 K/uL   Eosinophils Relative 1 %   Eosinophils Absolute 0.0 0 - 0.7 K/uL  Basophils Relative 1 %   Basophils Absolute 0.1 0 - 0.1 K/uL  Comprehensive metabolic panel     Status: Abnormal   Collection Time: 11/08/14 10:21 PM  Result Value Ref Range   Sodium 141 135 - 145 mmol/L   Potassium 5.0 3.5 - 5.1 mmol/L   Chloride 107 101 - 111 mmol/L   CO2 23 22 - 32 mmol/L   Glucose, Bld 76 65 - 99 mg/dL   BUN 22 (H) 6 - 20 mg/dL   Creatinine, Ser 1.12 0.61 - 1.24 mg/dL   Calcium 9.1 8.9 - 10.3 mg/dL   Total Protein 6.6 6.5 - 8.1 g/dL   Albumin 2.6 (L) 3.5 - 5.0 g/dL   AST 38 15 - 41 U/L   ALT 29 17 - 63 U/L   Alkaline Phosphatase 194 (H) 38 - 126 U/L   Total Bilirubin 1.0 0.3 - 1.2 mg/dL   GFR calc non Af Amer >60 >60 mL/min   GFR calc Af Amer >60 >60 mL/min    Comment: (NOTE) The eGFR has been calculated using the CKD EPI equation. This calculation has not been validated in all clinical situations. eGFR's persistently <60 mL/min signify possible Chronic  Kidney Disease.    Anion gap 11 5 - 15  Troponin I     Status: Abnormal   Collection Time: 11/08/14 10:21 PM  Result Value Ref Range   Troponin I 0.07 (H) <0.031 ng/mL    Comment: C/MATT MARTIN AT 2331 11/09/14.RWW/PMH RESULT CALLED TO, READ BACK BY AND VERIFIED WITH:   Lactic acid, plasma     Status: Abnormal   Collection Time: 11/08/14 10:21 PM  Result Value Ref Range   Lactic Acid, Venous 4.2 (HH) 0.5 - 2.0 mmol/L  Lipase, blood     Status: Abnormal   Collection Time: 11/08/14 10:21 PM  Result Value Ref Range   Lipase 18 (L) 22 - 51 U/L  Protime-INR     Status: Abnormal   Collection Time: 11/08/14 10:21 PM  Result Value Ref Range   Prothrombin Time 15.1 (H) 11.4 - 15.0 seconds   INR 1.17   Phosphorus     Status: None   Collection Time: 11/08/14 10:47 PM  Result Value Ref Range   Phosphorus 2.9 2.5 - 4.6 mg/dL  Magnesium     Status: None   Collection Time: 11/08/14 10:47 PM  Result Value Ref Range   Magnesium 2.2 1.7 - 2.4 mg/dL  Uric acid     Status: None   Collection Time: 11/08/14 10:47 PM  Result Value Ref Range   Uric Acid, Serum 7.6 4.4 - 7.6 mg/dL    Ct Abdomen Pelvis W Contrast  11/09/2014   CLINICAL DATA:  Diffuse abdominal pain.  Chemotherapy yesterday.  EXAM: CT ABDOMEN AND PELVIS WITH CONTRAST  TECHNIQUE: Multidetector CT imaging of the abdomen and pelvis was performed using the standard protocol following bolus administration of intravenous contrast.  CONTRAST:  160m OMNIPAQUE IOHEXOL 300 MG/ML  SOLN  COMPARISON:  10/23/2014  FINDINGS: BODY WALL: Partly visible left lower axillary lymphadenopathy, recently evaluated by chest CT 10/30/2014.  There is bulky bilateral inguinal lymphadenopathy which has increased from 10/23/2014, with left inguinal node on image 88 measuring 31 mm short axis, previously 23 mm  LOWER CHEST: Reticular opacities in the lower lungs, greater on the right, is favored atelectasis. No definitive pneumonia. Lower thoracic lymphadenopathy has  increased, with a left retrocrural lymph node currently 15 mm on image 23, previously 10 mm.  ABDOMEN/PELVIS:  Liver: No focal abnormality.  Biliary: Cholelithiasis. The gallbladder is distended but there is no inflammatory wall thickening.  Pancreas: Unremarkable.  Spleen: Mild splenomegaly, 15 cm in craniocaudal span. This is increased by approximately 4 cm since previous.  Adrenals: Negative.  Kidneys and ureters: Moderate bilateral hydroureteronephrosis to the level of the pelvis. This is despite an internal ureteral stent on the left which is well positioned. Symmetric renal enhancement.  Bladder: Thickening of the bladder wall, preferentially to the left, stable from previous. This is indeterminate and will be assessed on followup imaging.  Reproductive: No pathologic findings.  Bowel: No obstruction. No inflammatory bowel wall thickening  Retroperitoneum: There is bulky and diffuse retroperitoneal lymphadenopathy. There has been significant and rapid increased from previous. Index left periaortic lymph node on image 39 measures 88 x 69 mm (previously 59 x 35 mm) on image 39). Left pelvic sidewall lymph node that was previously measured is on image 71 and stable at 58 x 41 mm. There is new enlargement of upper abdominal retroperitoneal lymph nodes . Some cystic change, presumably treatment related, present in the largest nodes, especially in the left pelvis.  Vascular: Mass effect on the left iliac veins by lymphadenopathy, with progressive subcutaneous edema in the left upper thigh.  OSSEOUS: No acute abnormalities.  IMPRESSION: 1. Rapid progression of lymphoma with marked enlargement of lymph nodes compared to 10/23/2014. 2. Bilateral hydroureteronephrosis, new on the right and unchanged on the left (despite a well-positioned left internal ureteral stent). 3. Progressive edema in the upper left thigh. Given severe compression of the left iliac vein by lymphadenopathy, consider Doppler to evaluate for DVT.    Electronically Signed   By: Monte Fantasia M.D.   On: 11/09/2014 02:13   Dg Chest Port 1 View  11/08/2014   CLINICAL DATA:  Acute onset of sepsis.  Initial encounter.  EXAM: PORTABLE CHEST - 1 VIEW  COMPARISON:  Chest radiograph performed 11/12/2013, and CTA of the chest performed 10/30/2014  FINDINGS: The lungs are well-aerated. Mild vascular congestion is noted, with mild right basilar atelectasis. There is no evidence of pleural effusion or pneumothorax.  The cardiomediastinal silhouette is within normal limits. No acute osseous abnormalities are seen. A right-sided chest port is noted ending about the mid SVC.  IMPRESSION: Mild vascular congestion, with mild right basilar atelectasis. Lungs otherwise clear.   Electronically Signed   By: Garald Balding M.D.   On: 11/08/2014 23:06    Review of Systems  Constitutional: Positive for fever, chills, weight loss, malaise/fatigue and diaphoresis.  HENT: Negative.   Eyes: Negative.   Respiratory: Negative.   Cardiovascular: Negative.   Gastrointestinal: Positive for abdominal pain.  Genitourinary: Negative.   Musculoskeletal: Negative.   Skin: Negative.   Neurological: Positive for loss of consciousness and weakness.  Endo/Heme/Allergies: Negative.   Psychiatric/Behavioral: Negative.    Blood pressure 124/68, pulse 129, temperature 101.2 F (38.4 C), temperature source Rectal, resp. rate 21, height 6' 1"  (1.854 m), weight 82.2 kg (181 lb 3.5 oz), SpO2 88 %. Physical Exam  Constitutional: He appears well-developed.  Ill appearing in visible pain in ICU, moaning. Wife and family at bedside.   HENT:  Head: Normocephalic.  Eyes: Pupils are equal, round, and reactive to light.  Neck: Normal range of motion.  Cardiovascular:  Regular tachycardia by bedside monitor  Respiratory: Effort normal.  GI: Soft.  No localizing pain.   Genitourinary:  No CVAT / localizing flank pain  Musculoskeletal: Normal range of motion.  Neurological:  AOx0.    Skin: Skin is warm.    Assessment/Plan:  1 - Bilateral Malignant Hydronephrosis - due to lymphoma. His GFR is normal. If patient is going to receive more chemo he may benefit from maximal renal drainage from placement of right ureteral stent. As renal function normal and prognosis guarded, observaiton is reasonable should pt desire more palliative mode of therapy. Agree with oncology consult to establish overall goals of furhter cancer care.   2 - Post Chemo Fever of Unknown Origin - Agree with empiric ABX. UA without bacteruria. Should pt fail to improve on current therapy or UCX reveal significant clonal growth, would also be indication for furhter right renal drainage with placement of right stent.  3 - Abdominal Pain - Not frank CVAT or any other localizing source on exam. Most likely cancer-related from progressive lymphoma and its treatment (tumor lysis).   4 - Will follow  Wei Newbrough 11/09/2014, 2:53 AM

## 2014-11-09 NOTE — Progress Notes (Signed)
PT. FAMILY UNDER IMPRESSION FROM DAYSHIFT RN THAT PT WOULD BE STARTED ON DILAUDID PCA.  NO NOTED BY MD FOR SAME. EXPLAINED PLAN TO FAMILY TO KEEP PT. COMFORTABLE THIS SHIFT.

## 2014-11-09 NOTE — Progress Notes (Signed)
Received pt from er at 0514, pt moaning, will not answer any questions. Pt orientated to room and call light. Heart rate 133 on monitor. bp 134/49, sat 92% on RA, resp 31, will cont to monitor.

## 2014-11-09 NOTE — Progress Notes (Signed)
[facility] [place] NAME: Caleb Francis    MR#:  [id number]  DATE OF BIRTH:  01/08/1946  SUBJECTIVE:  CHIEF COMPLAINT:   Chief Complaint  Patient presents with  . Extremity Pain  -patient appears to be in severe distress secondary to his abdominal pain. He is moaning and restless in bed. He just received a dose of Dilaudid. Unable to provide more history. His wife and son are at bedside. -Continues to spike a low-grade temperature today. Cultures are pending. On IV anti-biotics.  REVIEW OF SYSTEMS:  Review of Systems  Constitutional: Positive for fever, chills and malaise/fatigue.  Eyes: Negative for blurred vision and double vision.  Respiratory: Negative for cough, shortness of breath and wheezing.   Cardiovascular: Negative for chest pain and palpitations.  Gastrointestinal: Positive for nausea and abdominal pain. Negative for vomiting, diarrhea and constipation.  Genitourinary: Negative for dysuria.  Musculoskeletal: Positive for myalgias.  Neurological: Negative for dizziness, seizures and headaches.    DRUG ALLERGIES:  No Known Allergies  VITALS:  Blood pressure 131/70, pulse 123, temperature 100.3 F (37.9 C), temperature source Rectal, resp. rate 22, height 6\' 1"  (1.854 m), weight 79.9 kg (176 lb 2.4 oz), SpO2 99 %.  PHYSICAL EXAMINATION:  Physical Exam  GENERAL:  69 y.o.-year-old patient lying in the bed, distress secondary to generalized abdominal pain. Worse on the left side.[name]  EYES: Pupils equal, round, reactive to light and accommodation. No scleral icterus. Extraocular muscles intact.  HEENT: Head atraumatic, normocephalic. Oropharynx and nasopharynx clear. Stream dry mucous membranes. NECK:  Supple, no jugular venous distention. No thyroid enlargement, no tenderness.  LUNGS: Normal breath sounds bilaterally, no wheezing, rales,rhonchi or crepitation. No use of accessory muscles of respiration.   CARDIOVASCULAR: S1, S2 normal. No murmurs, rubs, or gallops.  ABDOMEN: Distended, generalized tenderness, or worse in the left flank and left lateral abdominal region.  Bowel sounds present. No organomegaly or mass.  EXTREMITIES: No pedal edema, cyanosis, or clubbing.  NEUROLOGIC: Cranial nerves II through XII are intact. Generalized weakness present. No focal weakness noted. Sensation is intact. Gait not checked.  PSYCHIATRIC: The patient is alert and but disoriented likely from pain medication. Arousable and answers some simple questions.  SKIN: No obvious rash, lesion, or ulcer.    LABORATORY PANEL:   CBC  Recent Labs Lab 11/09/14 0559  WBC 7.0  HGB 8.4*  HCT 24.9*  PLT 110*   ------------------------------------------------------------------------------------------------------------------  Chemistries   Recent Labs Lab 11/08/14 2221 11/08/14 2247 11/09/14 0559  NA 141  --  140  K 5.0  --  4.5  CL 107  --  110  CO2 23  --  20*  GLUCOSE 76  --  71  BUN 22*  --  23*  CREATININE 1.12  --  1.09  CALCIUM 9.1  --  7.9*  MG  --  2.2  --   AST 38  --   --   ALT 29  --   --   ALKPHOS 194*  --   --   BILITOT 1.0  --   --    ------------------------------------------------------------------------------------------------------------------  Cardiac Enzymes  Recent Labs Lab 11/08/14 2221  TROPONINI 0.07*   ------------------------------------------------------------------------------------------------------------------  RADIOLOGY:  Ct Abdomen Pelvis W Contrast  11/09/2014   CLINICAL DATA:  Diffuse abdominal pain.  Chemotherapy yesterday.  EXAM: CT ABDOMEN AND PELVIS WITH CONTRAST  TECHNIQUE: Multidetector CT imaging of the abdomen and pelvis was performed using the standard protocol following  bolus administration of intravenous contrast.  CONTRAST:  151mL OMNIPAQUE IOHEXOL 300 MG/ML  SOLN  COMPARISON:  10/23/2014  FINDINGS: BODY WALL: Partly visible left lower axillary  lymphadenopathy, recently evaluated by chest CT 10/30/2014.  There is bulky bilateral inguinal lymphadenopathy which has increased from 10/23/2014, with left inguinal node on image 88 measuring 31 mm short axis, previously 23 mm  LOWER CHEST: Reticular opacities in the lower lungs, greater on the right, is favored atelectasis. No definitive pneumonia. Lower thoracic lymphadenopathy has increased, with a left retrocrural lymph node currently 15 mm on image 23, previously 10 mm.  ABDOMEN/PELVIS:  Liver: No focal abnormality.  Biliary: Cholelithiasis. The gallbladder is distended but there is no inflammatory wall thickening.  Pancreas: Unremarkable.  Spleen: Mild splenomegaly, 15 cm in craniocaudal span. This is increased by approximately 4 cm since previous.  Adrenals: Negative.  Kidneys and ureters: Moderate bilateral hydroureteronephrosis to the level of the pelvis. This is despite an internal ureteral stent on the left which is well positioned. Symmetric renal enhancement.  Bladder: Thickening of the bladder wall, preferentially to the left, stable from previous. This is indeterminate and will be assessed on followup imaging.  Reproductive: No pathologic findings.  Bowel: No obstruction. No inflammatory bowel wall thickening  Retroperitoneum: There is bulky and diffuse retroperitoneal lymphadenopathy. There has been significant and rapid increased from previous. Index left periaortic lymph node on image 39 measures 88 x 69 mm (previously 59 x 35 mm) on image 39). Left pelvic sidewall lymph node that was previously measured is on image 71 and stable at 58 x 41 mm. There is new enlargement of upper abdominal retroperitoneal lymph nodes . Some cystic change, presumably treatment related, present in the largest nodes, especially in the left pelvis.  Vascular: Mass effect on the left iliac veins by lymphadenopathy, with progressive subcutaneous edema in the left upper thigh.  OSSEOUS: No acute abnormalities.   IMPRESSION: 1. Rapid progression of lymphoma with marked enlargement of lymph nodes compared to 10/23/2014. 2. Bilateral hydroureteronephrosis, new on the right and unchanged on the left (despite a well-positioned left internal ureteral stent). 3. Progressive edema in the upper left thigh. Given severe compression of the left iliac vein by lymphadenopathy, consider Doppler to evaluate for DVT.   Electronically Signed   By: Monte Fantasia M.D.   On: 11/09/2014 02:13   [place] Extrem Low Left Comp  11/09/2014   CLINICAL DATA:  Left calf swelling and lower extremity pain. History of lymphoma.  EXAM: Left LOWER EXTREMITY VENOUS DOPPLER ULTRASOUND  TECHNIQUE: Gray-scale sonography with graded compression, as well as color Doppler and duplex ultrasound were performed to evaluate the lower extremity deep venous systems from the level of the common femoral vein and including the common femoral, femoral, profunda femoral, popliteal and calf veins including the posterior tibial, peroneal and gastrocnemius veins when visible. The superficial great saphenous vein was also interrogated. Spectral Doppler was utilized to evaluate flow at rest and with distal augmentation maneuvers in the common femoral, femoral and popliteal veins.  COMPARISON:  None.  FINDINGS: Contralateral Common Femoral Vein: Respiratory phasicity is normal and symmetric with the symptomatic side. No evidence of thrombus. Normal compressibility.  Common Femoral Vein: No evidence of thrombus. Normal compressibility, respiratory phasicity and response to augmentation.  Saphenofemoral Junction - Superficial Great Saphenous Vein: Due to patient pain, the patient requested that the examination be terminated therefore the superficial femoral vein through popliteal trifurcation could not be evaluated.  IMPRESSION: No evidence of deep venous  thrombosis involving the upper common femoral vein. The patient requested that the examination be terminated and did not allow  the sonographer to examine the lower extremity venous system from the level of the saphenous femoral junction through the popliteal trifurcation.   Electronically Signed   By: Conchita Paris M.D.   On: 11/09/2014 09:40   Dg Chest Port 1 View  11/08/2014   CLINICAL DATA:  Acute onset of sepsis.  Initial encounter.  EXAM: PORTABLE CHEST - 1 VIEW  COMPARISON:  Chest radiograph performed 11/12/2013, and CTA of the chest performed 10/30/2014  FINDINGS: The lungs are well-aerated. Mild vascular congestion is noted, with mild right basilar atelectasis. There is no evidence of pleural effusion or pneumothorax.  The cardiomediastinal silhouette is within normal limits. No acute osseous abnormalities are seen. A right-sided chest port is noted ending about the mid SVC.  IMPRESSION: Mild vascular congestion, with mild right basilar atelectasis. Lungs otherwise clear.   Electronically Signed   By: Garald Balding M.D.   On: 11/08/2014 23:06    EKG:   Orders placed or performed during the hospital encounter of 11/08/14  . EKG 12-Lead  . EKG 12-Lead    ASSESSMENT AND PLAN:   69 year old African-American male with past medical history significant for mantle cell lymphoma with recurrence, started on chemotherapy 3 days ago, recent admission for left-sided hydronephrosis status post ureteral stent placement last week, comes to the hospital secondary to worsening of abdominal pain and fevers.  #1 sepsis-no source identified at this time. Blood cultures are drawn. Continue IV vancomycin and Zosyn at this time. - Has significant abdominal pain. Not sure if the recently placed stent is the source of infection. - Urology has been consulted.  #2 abdominal pain-likely from worsening lymphoma. Now has bilateral hydronephrosis. Pain medications as needed.  #3 left lower extremity edema-likely compression of the iliac vein by a significant retroperitoneal lymphadenopathy. Dopplers have been ordered to rule out  DVT.  #4 mantle cell lymphoma-diagnosed in September 2014. Initial chemotherapy at that time according to wife. Possible recurrence versus residual disease and restarted on chemotherapy days ago. Now CT with progression of the lymphadenopathy. Oncology has been consulted. Possible poor prognosis.  #5 anemia-anemia of chronic disease. Likely acute from recent chemotherapy. Monitor. No acute indication for transfusion at this time.  #6 gout-hold allopurinol if mental status is not stable.  #7 DVT prophylaxis- subcutaneous heparin.  #6 hypertension-hold all blood pressure medications, low normal blood pressure due to sepsis. Change his metoprolol to IV scheduled for his tachycardia.   All the records are reviewed and case discussed with Care Management/Social Workerr. Management plans discussed with the patient, family and they are in agreement.  CODE STATUS: Full code  TOTAL CRITICAL CARE TIME SPENT IN TAKING CARE OF THIS PATIENT: 42 minutes.   POSSIBLE D/C IN 3 DAYS, DEPENDING ON CLINICAL CONDITION.   Gladstone Lighter M.D on 11/09/2014 at 11:55 AM  Between 7am to 6pm - Pager - [phone]  After 6pm go to [email or web address] - password [email or web address] [facility]  [facility]  Office  [phone]  CC: Primary care physician; No PCP Per Patient

## 2014-11-09 NOTE — Progress Notes (Signed)
Patient appears to be more comfortable after IV pain medication.  Dr. Sherryle Lis notified of prior pain issue and also that patient is tachycardic 125-130's and skin is hot to touch.  Axillary temperature is 98.4.  Nurse would like to obtain rectal temp for verification but patient pain level will not allow. Dr. Sherryle Lis also made aware that patient is with dry oral mucus membranes but will not allow mouth care. Dr. Tressia Miners verbalized that she would place IV pain medication order and round on patient soon.   Family at bedside and updated on plan of care.

## 2014-11-09 NOTE — Progress Notes (Signed)
Lactic acid this morning  Is 2. 1,  Level trending down compare to previous result of 2.9. report to day shift RN for follow up with MD this morning.

## 2014-11-09 NOTE — Progress Notes (Signed)
ANTIBIOTIC CONSULT NOTE - FOLLOW UP  Pharmacy Consult for Vancomycin and Zosyn Indication: rule out sepsis  No Known Allergies  Patient Measurements: Height: 6\' 1"  (185.4 cm) Weight: 181 lb 3.5 oz (82.2 kg) IBW/kg (Calculated) : 79.9 Adjusted Body Weight: 78.1 kg  Vital Signs: Temp: 100.6 F (38.1 C) (05/28 0433) Temp Source: Rectal (05/28 0433) BP: 103/90 mmHg (05/28 0433) Pulse Rate: 128 (05/28 0433) Intake/Output from previous day: 05/27 0701 - 05/28 0700 In: -  Out: 300 [Urine:300] Intake/Output from this shift: Total I/O In: -  Out: 300 [Urine:300]  Labs:  Recent Labs  11/07/14 0835 11/08/14 2221  WBC  --  8.6  HGB  --  9.4*  PLT  --  133*  CREATININE 1.23 1.12   Estimated Creatinine Clearance: 72.3 mL/min (by C-G formula based on Cr of 1.12). No results for input(s): VANCOTROUGH, VANCOPEAK, VANCORANDOM, GENTTROUGH, GENTPEAK, GENTRANDOM, TOBRATROUGH, TOBRAPEAK, TOBRARND, AMIKACINPEAK, AMIKACINTROU, AMIKACIN in the last 72 hours.   Microbiology: No results found for this or any previous visit (from the past 720 hour(s)).  Anti-infectives    Start     Dose/Rate Route Frequency Ordered Stop   11/08/14 2230  piperacillin-tazobactam (ZOSYN) IVPB 3.375 g     3.375 g 100 mL/hr over 30 Minutes Intravenous  Once 11/08/14 2228 11/08/14 2351   11/08/14 2230  vancomycin (VANCOCIN) IVPB 1000 mg/200 mL premix     1,000 mg 200 mL/hr over 60 Minutes Intravenous  Once 11/08/14 2228 11/09/14 0102      Assessment: Patient with h/o of lymphoma admitted with abdominal pain. Consulted to dose Vancomycin and Zosyn.  Ke=0.064, t1/2=10.8hr, Vd=57.5L  Goal of Therapy:  Vancomycin trough level 10-15 mcg/ml  Plan:  Measure antibiotic drug levels at steady state Follow up culture results Vancomycin 1g IV q12h. Trough level prior to 5th dose. Zosyn 3.375g IV q8h EI.  [name] Fusi, PharmD, BCPS 11/09/2014 4:48 AM

## 2014-11-09 NOTE — Progress Notes (Signed)
Patient is calling out for his mother and hallucinating.  Dr. Tressia Miners called again and will come back to round on patient.

## 2014-11-09 NOTE — ED Provider Notes (Signed)
-----------------------------------------   1:31 AM on 11/09/2014 -----------------------------------------  Reevaluation of the patient at this time. Discussed with the patient and his family his lab results, we discussed concerning results of his labs and that we have initiated anabiotic's. He is still in a fair amount of pain. His abdomen is very distended and tender. He is currently going to CT scan, I ordered an additional 0.5 mg a lot of for him. His vital signs are stable, he remains febrile we will give Tylenol here. Patient's lactic acid was significantly elevated, continued fluids. He is currently not hypotensive. I anticipate admission to the ICU, however we need to further delineate etiology of abdominal pain via CT imaging at this time.  Discussed with patient's family critical nature of his circumstance at present. The patient is awake and alert, he is slightly confused which apparently started after receiving narcotics, however given his amount of pain that he notes, it seems reasonable to continue with low doses of opiates.  ----------------------------------------- 2:40 AM on 11/09/2014 -----------------------------------------  I reviewed CT scan. I have paged urology to further discuss as the patient did have a recent ureteral stent placed. I also discussed with Dr. Reece Levy in their house supervisor about admission to stepdown and/or ICU level care. Dr. Reece Levy accepting the patient in admission. Clinically patient remains same as about one hour ago, he still does have ongoing tachycardia. His lungs are clear. His abdomen remains tender. He is alert, but in pain, and appears slightly confused since starting narcotics. Overall his clinical picture is poor, however at this point there is currently no indication for central line, pressors, or advanced airway management. We will continue to follow his clinical picture closely as he is at significant risk for  worsening.  ----------------------------------------- 2:45 AM on 11/09/2014 -----------------------------------------  Discuss with on-call urologist Dr. Tresa Moore, reviewed CT as well as patient's recent clinical evaluation by neurology with stent placement. At this time Dr. Tresa Moore indicates that there is no need for urgent urologic consultation as the patient's creatinine has remained stable. However, he will see him in consult today in the hospital, will continue medical care for sepsis. Await UA.  Admitted to medicine. Dr. Reece Levy.  Delman Kitten, MD 11/09/14 [phone]

## 2014-11-09 NOTE — H&P (Signed)
[facility] [place] NAME: Caleb Francis    MR#:  [id number]  DATE OF BIRTH:  10/24/1945  DATE OF ADMISSION:  11/08/2014  PRIMARY CARE PHYSICIAN: No PCP Per Patient   REQUESTING/REFERRING PHYSICIAN: Quale  CHIEF COMPLAINT:   Chief Complaint  Patient presents with  . Extremity Pain   1. Abdominal pain 2. Decreased responsiveness 3. Fever  HISTORY OF PRESENT ILLNESS:  Caleb Francis  is a 69 y.o. male with lymphoma on chemotherapy, hypertension, rheumatoid arthritis, recent admission with left hydronephrosis status post ureteric stent, chronic hypokalemia was brought in by EMS with the complaints of decreased responsiveness, ongoing abdominal pain and fever. Patient was evaluated by the ED physician and was found to be with severe abdominal pain, was given IV pain medications. Patient was also noted to be tachycardic with elevated temperature of 101.35F and stable blood pressure. Of note patient received chemotherapy recently about 2 days ago. Blood work revealed normal CBC, normal BMP, elevated ALP, elevated lactate of 4.2. Urinalysis unremarkable for any infection. Chest x-ray negative for pneumonia. EKG sinus tach with ventricular rate of 129 bpm. Obtaining blood and urine cultures patient was started on IV antibiotics namely vancomycin and Zosyn. In view of continued abdominal pain, patient underwent CT of the abdomen and pelvis which revealed moderate bilateral hydronephrosis, right-sided new hydronephrosis, bulky retroperitoneal adenopathy which is progressive. Urology on call was consulted by the ED physician who advised no acute intervention at this time hence a point for admission for medicine.[name] Hence hospitalist service was consulted for further management. Patient is very lethargic at this time, responding minimally to few questions monosyllabic. No further history available from the patient at this time. According to the patient's  son who is with her bedside at this time, patient was in his usual state of health yesterday morning.  PAST MEDICAL HISTORY:   Past Medical History  Diagnosis Date  . Arthritis   . Hypertension   . RA (rheumatoid arthritis)   . Anemia   . Cancer     lymphoma  . History of nuclear stress test     a. 12/2013: low risk, no sig ischemia, no EKG changes, no artifact, EF 63%  . Chronic kidney disease     PAST SURGICAL HISTORY:   Past Surgical History  Procedure Laterality Date  . Back surgery    . Fracture surgery      ankle  . Portacath placement    . Cystoscopy w/ ureteral stent placement Left 10/24/2014    Procedure: CYSTOSCOPY WITH RETROGRADE PYELOGRAM/URETERAL STENT PLACEMENT;  Surgeon: Irine Seal, MD;  Location: [facility] ORS;  Service: Urology;  Laterality: Left;    SOCIAL HISTORY:   History  Substance Use Topics  . Smoking status: Former Smoker -- 0.50 packs/day for 60 years    Types: Cigarettes    Quit date: 10/18/2014  . Smokeless tobacco: Not on file  . Alcohol Use: No    FAMILY HISTORY:   Family History  Problem Relation Age of Onset  . Cancer Mother     breast  . Cancer Father     bone cancer    DRUG ALLERGIES:  No Known Allergies  REVIEW OF SYSTEMS:   Review of Systems  Unable to perform ROS: mental status change  Constitutional: Positive for fever, chills and malaise/fatigue.       According to the patient's son who is with the patient at this time    MEDICATIONS AT HOME:  Prior to Admission medications   Medication Sig Start Date End Date Taking? Authorizing Provider  acetaminophen (TYLENOL) 325 MG tablet Take 2 tablets (650 mg total) by mouth every 6 (six) hours as needed for mild pain or fever. 10/31/14   Max Sane, MD  allopurinol (ZYLOPRIM) 300 MG tablet Take 1 tablet (300 mg total) by mouth daily. 10/29/14   Lequita Asal, MD  amLODipine (NORVASC) 5 MG tablet Take 1 tablet (5 mg total) by mouth daily. 10/29/14   Lequita Asal, MD   amoxicillin-clavulanate (AUGMENTIN) 875-125 MG per tablet Take 1 tablet by mouth every 12 (twelve) hours. 10/31/14   Max Sane, MD  docusate sodium (COLACE) 100 MG capsule Take 1 capsule (100 mg total) by mouth 2 (two) times daily. 10/27/14   Lloyd Huger, MD  feeding supplement, ENSURE ENLIVE, (ENSURE ENLIVE) LIQD Take 237 mLs by mouth 3 (three) times daily between meals. 10/29/14   Lequita Asal, MD  folic acid (FOLVITE) 1 MG tablet Take 1 mg by mouth.    Historical Provider, MD  KLOR-CON M10 10 MEQ tablet  10/27/14   Historical Provider, MD  magnesium oxide (MAG-OX) 400 (241.3 MG) MG tablet Take 1 tablet (400 mg total) by mouth 2 (two) times daily. 10/29/14   Lequita Asal, MD  metoprolol tartrate 37.5 MG TABS Take 37.5 mg by mouth 2 (two) times daily. 10/31/14   Max Sane, MD  oxybutynin (DITROPAN) 5 MG tablet Take 1 tablet (5 mg total) by mouth every 8 (eight) hours as needed for bladder spasms (spasms or stent related pain). 10/30/14   Max Sane, MD  oxyCODONE-acetaminophen (PERCOCET/ROXICET) 5-325 MG per tablet Take 1-2 tablets by mouth every 4 (four) hours as needed for moderate pain. 10/27/14   Lloyd Huger, MD  spironolactone (ALDACTONE) 25 MG tablet Take 1 tablet (25 mg total) by mouth daily. 10/29/14   Lequita Asal, MD      VITAL SIGNS:  Blood pressure 124/68, pulse 129, temperature 101.2 F (38.4 C), temperature source Rectal, resp. rate 21, height 6\' 1"  (1.854 m), weight 82.2 kg (181 lb 3.5 oz), SpO2 88 %.  PHYSICAL EXAMINATION:  Physical Exam  Constitutional: He appears distressed.  HENT:  Head: Normocephalic and atraumatic.  Right Ear: External ear normal.  Left Ear: External ear normal.  Nose: Nose normal.  Mouth/Throat: Oropharynx is clear and moist. No oropharyngeal exudate.  Eyes: EOM are normal. Pupils are equal, round, and reactive to light. No scleral icterus.  Neck: Normal range of motion. Neck supple. No JVD present. No thyromegaly present.   Cardiovascular: Regular rhythm, normal heart sounds and intact distal pulses.  Exam reveals no friction rub.   No murmur heard. Tachycardia  Respiratory: Effort normal and breath sounds normal. No respiratory distress. He has no wheezes. He has no rales. He exhibits no tenderness.  GI: Soft. Bowel sounds are normal. He exhibits distension. He exhibits no mass. There is tenderness. There is no rebound and no guarding.  Musculoskeletal: Normal range of motion. He exhibits edema.  Left lower extremity +  Lymphadenopathy:    He has no cervical adenopathy.  Neurological:  Drowsy but arousable and responding to questions monosyllable  Moving all 4 limbs equally  Skin: Skin is warm. No rash noted. No erythema.  Psychiatric:  Unable to perform   LABORATORY PANEL:   CBC  Recent Labs Lab 11/08/14 2221  WBC 8.6  HGB 9.4*  HCT 28.2*  PLT 133*   ------------------------------------------------------------------------------------------------------------------  Chemistries  Recent Labs Lab 11/08/14 2221 11/08/14 2247  NA 141  --   K 5.0  --   CL 107  --   CO2 23  --   GLUCOSE 76  --   BUN 22*  --   CREATININE 1.12  --   CALCIUM 9.1  --   MG  --  2.2  AST 38  --   ALT 29  --   ALKPHOS 194*  --   BILITOT 1.0  --    ------------------------------------------------------------------------------------------------------------------  Cardiac Enzymes  Recent Labs Lab 11/08/14 2221  TROPONINI 0.07*   ------------------------------------------------------------------------------------------------------------------  RADIOLOGY:  Ct Abdomen Pelvis W Contrast  11/09/2014   CLINICAL DATA:  Diffuse abdominal pain.  Chemotherapy yesterday.  EXAM: CT ABDOMEN AND PELVIS WITH CONTRAST  TECHNIQUE: Multidetector CT imaging of the abdomen and pelvis was performed using the standard protocol following bolus administration of intravenous contrast.  CONTRAST:  164mL OMNIPAQUE IOHEXOL 300  MG/ML  SOLN  COMPARISON:  10/23/2014  FINDINGS: BODY WALL: Partly visible left lower axillary lymphadenopathy, recently evaluated by chest CT 10/30/2014.  There is bulky bilateral inguinal lymphadenopathy which has increased from 10/23/2014, with left inguinal node on image 88 measuring 31 mm short axis, previously 23 mm  LOWER CHEST: Reticular opacities in the lower lungs, greater on the right, is favored atelectasis. No definitive pneumonia. Lower thoracic lymphadenopathy has increased, with a left retrocrural lymph node currently 15 mm on image 23, previously 10 mm.  ABDOMEN/PELVIS:  Liver: No focal abnormality.  Biliary: Cholelithiasis. The gallbladder is distended but there is no inflammatory wall thickening.  Pancreas: Unremarkable.  Spleen: Mild splenomegaly, 15 cm in craniocaudal span. This is increased by approximately 4 cm since previous.  Adrenals: Negative.  Kidneys and ureters: Moderate bilateral hydroureteronephrosis to the level of the pelvis. This is despite an internal ureteral stent on the left which is well positioned. Symmetric renal enhancement.  Bladder: Thickening of the bladder wall, preferentially to the left, stable from previous. This is indeterminate and will be assessed on followup imaging.  Reproductive: No pathologic findings.  Bowel: No obstruction. No inflammatory bowel wall thickening  Retroperitoneum: There is bulky and diffuse retroperitoneal lymphadenopathy. There has been significant and rapid increased from previous. Index left periaortic lymph node on image 39 measures 88 x 69 mm (previously 59 x 35 mm) on image 39). Left pelvic sidewall lymph node that was previously measured is on image 71 and stable at 58 x 41 mm. There is new enlargement of upper abdominal retroperitoneal lymph nodes . Some cystic change, presumably treatment related, present in the largest nodes, especially in the left pelvis.  Vascular: Mass effect on the left iliac veins by lymphadenopathy, with  progressive subcutaneous edema in the left upper thigh.  OSSEOUS: No acute abnormalities.  IMPRESSION: 1. Rapid progression of lymphoma with marked enlargement of lymph nodes compared to 10/23/2014. 2. Bilateral hydroureteronephrosis, new on the right and unchanged on the left (despite a well-positioned left internal ureteral stent). 3. Progressive edema in the upper left thigh. Given severe compression of the left iliac vein by lymphadenopathy, consider Doppler to evaluate for DVT.   Electronically Signed   By: Monte Fantasia M.D.   On: 11/09/2014 02:13   Dg Chest Port 1 View  11/08/2014   CLINICAL DATA:  Acute onset of sepsis.  Initial encounter.  EXAM: PORTABLE CHEST - 1 VIEW  COMPARISON:  Chest radiograph performed 11/12/2013, and CTA of the chest performed 10/30/2014  FINDINGS: The lungs are well-aerated. Mild  vascular congestion is noted, with mild right basilar atelectasis. There is no evidence of pleural effusion or pneumothorax.  The cardiomediastinal silhouette is within normal limits. No acute osseous abnormalities are seen. A right-sided chest port is noted ending about the mid SVC.  IMPRESSION: Mild vascular congestion, with mild right basilar atelectasis. Lungs otherwise clear.   Electronically Signed   By: Garald Balding M.D.   On: 11/08/2014 23:06    EKG:   Orders placed or performed during the hospital encounter of 11/08/14  . EKG 12-Lead sinus tachycardia with ventricular rate of 1 29 bpm, no acute ischemic changes.   . EKG 12-Lead    IMPRESSION AND PLAN:   1. Sepsis, source unclear at this point. Known history of lymphoma on chemotherapy. Plan: Admit to CCU, IV fluids, continue IV antibiotics- Vanco and Zosyn, follow-up blood cultures, CBC, lactate levels.  2. Abdominal pain related to progressive cancer/lymphoma. Plan: Pain control measures. 3. Bilateral hydronephrosis, prior left hydro-nephrosis status post stent, now with new right-sided hydronephrosis. Urology consultation  for further advice placed. 4. Known case of lymphoma, undergoing chemotherapy per oncology, last chemotherapy 2 days ago. CT of the abdomen shows progression of retroperitoneal adenopathy. Plan: Oncology consultation for further advice. 5. Left lower extremity swelling likely secondary to compression from inguinal lymphadenopathy. Rule out DVT. Plan: Obtain DVT studies & follow-up accordingly. 6. Hypertension, stable on home medications. Continue same monitor BP closely. 7. Chronic hypokalemia, on potassium supplementation, stable. Continue potassium supplementation monitor BMP. 8. Rheumatoid arthritis. No acute problems at present. Hold home medications for now because of sepsis.    All the records are reviewed and case discussed with ED provider. Management plans discussed with the patient, family and they are in agreement.  CODE STATUS: Full code  TOTAL TIME TAKING CARE OF THIS PATIENT: 50 minutes.    Juluis Mire M.D on 11/09/2014 at 4:09 AM  Between 7am to 6pm - Pager - [phone]  After 6pm go to [email or web address] - password [email or web address] [facility]  [facility]  Office  [phone]  CC: Primary care physician; No PCP Per Patient

## 2014-11-10 LAB — BASIC METABOLIC PANEL
Anion gap: 10 (ref 5–15)
BUN: 41 mg/dL — AB (ref 6–20)
CHLORIDE: 116 mmol/L — AB (ref 101–111)
CO2: 19 mmol/L — AB (ref 22–32)
Calcium: 8.3 mg/dL — ABNORMAL LOW (ref 8.9–10.3)
Creatinine, Ser: 1.4 mg/dL — ABNORMAL HIGH (ref 0.61–1.24)
GFR calc Af Amer: 59 mL/min — ABNORMAL LOW (ref >60)
GFR calc non Af Amer: 50 mL/min — ABNORMAL LOW (ref >60)
Glucose, Bld: 82 mg/dL (ref 65–99)
Potassium: 3.7 mmol/L (ref 3.5–5.1)
SODIUM: 145 mmol/L (ref 135–145)

## 2014-11-10 LAB — URINE CULTURE: CULTURE: NO GROWTH

## 2014-11-10 LAB — GLUCOSE, CAPILLARY: GLUCOSE-CAPILLARY: 75 mg/dL (ref 65–99)

## 2014-11-10 MED ORDER — SODIUM CHLORIDE 0.9 % IV SOLN
INTRAVENOUS | Status: DC
  Administered 2014-11-10 – 2014-11-14 (×8): via INTRAVENOUS

## 2014-11-10 MED ORDER — METOPROLOL TARTRATE 1 MG/ML IV SOLN
2.5000 mg | Freq: Four times a day (QID) | INTRAVENOUS | Status: DC | PRN
  Filled 2014-11-10: qty 5

## 2014-11-10 NOTE — Care Management Note (Addendum)
Case Management Note  Patient Details  Name: Caleb Francis MRN: [id number] Date of Birth: [birth year]     Per [facility] Caleb Francis is an active client of [facility] for PT and RN.                        Expected Discharge Date:                  Expected Discharge Plan:     In-House Referral:     Discharge planning Services     Post Acute Care Choice:    Choice offered to:     DME Arranged:    DME Agency:     HH Arranged:    Center Agency:     Status of Service:     Medicare Important Message Given:    Date Medicare IM Given:    Medicare IM give by:    Date Additional Medicare IM Given:    Additional Medicare Important Message give by:     If discussed at [place] of Stay Meetings, dates discussed:    Additional Comments:  Rockett,Marilyn A, RN 11/10/2014, 2:19 PM  Attempted to reach patient by phone to discuss discharge planning. RNCM to continue to follow. ACJohnnson RNCM

## 2014-11-10 NOTE — Plan of Care (Signed)
Problem: Discharge Progression Outcomes Goal: Discharge plan in place and appropriate Individualization of Care:   PMH: Arthritis, HTN, RA, Anemia, CKD, and Lymphoma (Diagnosed Sept. 2014).  High Fall Risk - safety checks with hourly rounding.      Goal: Other Discharge Outcomes/Goals Plan of care progress to goal:  Patient transferred from CCU this morning, admitted 11/08/14 with Sepsis. CT Abd. 11/09/14 - showed rapid progression of lymphoma with marked enlargement compared to 10/23/14. Unable to complete US due to agitation/pain. Per Dr. Tressia Miners progress note, if no improvement may implement comfort care measures/hospice. Code status changed to DNR yesterday.   NS at 75 ml/hr infusing. Diet advanced to full liquid. Ativan/Dilaudid PRN ordered if needed. Ativan given once since transfer, relief noted. Foley in place. Family at bedside. Patient seems to be more alert this afternoon. Denies pain at this time. Patient/family support provided.

## 2014-11-10 NOTE — Progress Notes (Signed)
ANTIBIOTIC CONSULT NOTE - FOLLOW UP  Pharmacy Consult for Vancomycin and Zosyn Indication: rule out sepsis  No Known Allergies  Patient Measurements: Height: 6\' 1"  (185.4 cm) Weight: 176 lb 2.4 oz (79.9 kg) IBW/kg (Calculated) : 79.9 Adjusted Body Weight: 78.1 kg  Vital Signs: Temp: 98.3 F (36.8 C) (05/29 1337) Temp Source: Oral (05/29 1337) BP: 146/82 mmHg (05/29 1337) Pulse Rate: 128 (05/29 1337) Intake/Output from previous day: 05/28 0701 - 05/29 0700 In: 1796.3 [I.V.:946.3; IV Piggyback:250] Out: 2125 [Urine:2125] Intake/Output from this shift: Total I/O In: 250 [IV Piggyback:250] Out: [place] [Urine:1675]  Labs:  Recent Labs  11/08/14 2221 11/09/14 0559 11/10/14 1006  WBC 8.6 7.0  --   HGB 9.4* 8.4*  --   PLT 133* 110*  --   CREATININE 1.12 1.09 1.40*   Estimated Creatinine Clearance: 57.9 mL/min (by C-G formula based on Cr of 1.4). No results for input(s): VANCOTROUGH, VANCOPEAK, VANCORANDOM, GENTTROUGH, GENTPEAK, GENTRANDOM, TOBRATROUGH, TOBRAPEAK, TOBRARND, AMIKACINPEAK, AMIKACINTROU, AMIKACIN in the last 72 hours.   Microbiology: Recent Results (from the past 720 hour(s))  Blood Culture (routine x 2)     Status: None (Preliminary result)   Collection Time: 11/08/14 10:21 PM  Result Value Ref Range Status   Specimen Description BLOOD  Final   Special Requests NONE  Final   Culture NO GROWTH 2 DAYS  Final   Report Status PENDING  Incomplete  Blood Culture (routine x 2)     Status: None (Preliminary result)   Collection Time: 11/08/14 10:22 PM  Result Value Ref Range Status   Specimen Description BLOOD  Final   Special Requests NONE  Final   Culture NO GROWTH 2 DAYS  Final   Report Status PENDING  Incomplete  Urine culture     Status: None   Collection Time: 11/09/14  2:30 AM  Result Value Ref Range Status   Specimen Description URINE, RANDOM  Final   Special Requests NONE  Final   Culture NO GROWTH 1 DAY  Final   Report Status 11/10/2014 FINAL   Final    Anti-infectives    Start     Dose/Rate Route Frequency Ordered Stop   11/09/14 1200  vancomycin (VANCOCIN) IVPB 1000 mg/200 mL premix     1,000 mg 200 mL/hr over 60 Minutes Intravenous Every 12 hours 11/09/14 0457     11/09/14 0600  piperacillin-tazobactam (ZOSYN) IVPB 3.375 g     3.375 g 12.5 mL/hr over 240 Minutes Intravenous 3 times per day 11/09/14 0457     11/08/14 2230  piperacillin-tazobactam (ZOSYN) IVPB 3.375 g     3.375 g 100 mL/hr over 30 Minutes Intravenous  Once 11/08/14 2228 11/08/14 2351   11/08/14 2230  vancomycin (VANCOCIN) IVPB 1000 mg/200 mL premix     1,000 mg 200 mL/hr over 60 Minutes Intravenous  Once 11/08/14 2228 11/09/14 0102      Assessment: Patient with h/o of lymphoma admitted with abdominal pain. Consulted to dose Vancomycin and Zosyn. Per Oncology note: possible tumor lysis syndrome?  Ke=0.064, t1/2=10.8hr, Vd=57.5L  Goal of Therapy:  Vancomycin trough level 15-20 mcg/ml  Plan:  Measure antibiotic drug levels at steady state Follow up culture results Vancomycin 1g IV q12h. Trough level prior to 5th dose. Zosyn 3.375g IV q8h EI. TRough 5/29 at 2330. Scr increased.   Chinita Greenland PharmD Clinical Pharmacist 11/10/2014

## 2014-11-10 NOTE — Consult Note (Signed)
[place]  [phone]  Fax:([phone]) [place]: [birth year]  MR#: [id number]  AST#:[id number]  Patient Care Team: No Pcp Per Patient as PCP - General (General Practice)  CHIEF COMPLAINT:  Chief Complaint  Patient presents with  . Extremity Pain    INTERVAL HISTORY: Caleb Francis is a 69 y.o. male with progressive mantle cell lymphoma who was admitted with intractable pain. Also with AMS today, hallucinations, delirium. His nurse reports that his pain is better controlled now but this morning he was in severe distress. He has recently had a CT scan of head to evaluate AMS. [place] this morning was not completed due to his severe pain. He is lying in bed currently sedated, received lorazepam due to agitation. Is on IV hydromorphone for pain PRN.  REVIEW OF SYSTEMS:   Review of Systems  Neurological: Positive for weakness.  Psychiatric/Behavioral: The patient is nervous/anxious.   All other systems reviewed and are negative.   As per HPI. Otherwise, a complete review of systems is negative.  ONCOLOGY HISTORY:  No history exists.    PAST MEDICAL HISTORY: Past Medical History  Diagnosis Date  . Arthritis   . Hypertension   . RA (rheumatoid arthritis)   . Anemia   . Cancer     lymphoma  . History of nuclear stress test     a. 12/2013: low risk, no sig ischemia, no EKG changes, no artifact, EF 63%  . Chronic kidney disease     PAST SURGICAL HISTORY: Past Surgical History  Procedure Laterality Date  . Back surgery    . Fracture surgery      ankle  . Portacath placement    . Cystoscopy w/ ureteral stent placement Left 10/24/2014    Procedure: CYSTOSCOPY WITH RETROGRADE PYELOGRAM/URETERAL STENT PLACEMENT;  Surgeon: Irine Seal, MD;  Location: [facility] ORS;  Service: Urology;  Laterality: Left;    FAMILY HISTORY Family History  Problem Relation Age of Onset  . Cancer Mother     breast  . Cancer Father     bone cancer     GYNECOLOGIC HISTORY:  No LMP for male patient.     ADVANCED DIRECTIVES:    HEALTH MAINTENANCE: History  Substance Use Topics  . Smoking status: Former Smoker -- 0.50 packs/day for 60 years    Types: Cigarettes    Quit date: 10/18/2014  . Smokeless tobacco: Not on file  . Alcohol Use: No     Colonoscopy:  PAP:  Bone density:  Lipid panel:  No Known Allergies  Current Facility-Administered Medications  Medication Dose Route Frequency Provider Last Rate Last Dose  . 0.9 %  sodium chloride infusion   Intravenous Continuous Gladstone Lighter, MD 75 mL/hr at 11/10/14 1052    . acetaminophen (TYLENOL) suppository 650 mg  650 mg Rectal Q6H PRN Gladstone Lighter, MD   650 mg at 11/09/14 1008  . antiseptic oral rinse (CPC / CETYLPYRIDINIUM CHLORIDE 0.05%) solution 7 mL  7 mL Mouth Rinse q12n4p Gladstone Lighter, MD   7 mL at 11/09/14 1200  . chlorhexidine (PERIDEX) 0.12 % solution 15 mL  15 mL Mouth Rinse BID Gladstone Lighter, MD   15 mL at 11/10/14 0831  . heparin injection 5,000 Units  5,000 Units Subcutaneous 3 times per day Juluis Mire, MD   5,000 Units at 11/10/14 [phone]  . HYDROmorphone (DILAUDID) injection 0.5-1 mg  0.5-1 mg Intravenous Q4H PRN Gladstone Lighter, MD   1 mg at  11/10/14 0911  . ipratropium (ATROVENT) nebulizer solution 0.5 mg  0.5 mg Nebulization Q6H PRN Gladstone Lighter, MD      . LORazepam (ATIVAN) injection 1 mg  1 mg Intravenous Q6H PRN Gladstone Lighter, MD   1 mg at 11/10/14 0641  . metoprolol (LOPRESSOR) injection 2.5 mg  2.5 mg Intravenous QID PRN Gladstone Lighter, MD      . ondansetron (ZOFRAN) injection 4 mg  4 mg Intravenous Q6H PRN Juluis Mire, MD      . piperacillin-tazobactam (ZOSYN) IVPB 3.375 g  3.375 g Intravenous 3 times per day Vira Blanco, RPH   3.375 g at 11/10/14 0641  . vancomycin (VANCOCIN) IVPB 1000 mg/200 mL premix  1,000 mg Intravenous Q12H Vira Blanco, RPH   1,000 mg at 11/10/14 1151   Facility-Administered Medications  Ordered in Other Encounters  Medication Dose Route Frequency Provider Last Rate Last Dose  . 0.9 %  sodium chloride infusion   Intravenous Continuous Lequita Asal, MD 20 mL/hr at 11/06/14 1010 20 mL/hr at 11/06/14 1010  . morphine 2 MG/ML injection 2 mg  2 mg Intravenous Q2H PRN Lequita Asal, MD   2 mg at 11/05/14 1422  . sodium chloride 0.9 % injection 10 mL  10 mL Intracatheter PRN Lequita Asal, MD   10 mL at 11/06/14 1011    OBJECTIVE: BP 146/82 mmHg  Pulse 128  Temp(Src) 98.3 F (36.8 C) (Oral)  Resp 20  Ht 6\' 1"  (1.854 m)  Wt 176 lb 2.4 oz (79.9 kg)  BMI 23.24 kg/m2  SpO2 95%   Body mass index is 23.24 kg/(m^2).    ECOG FS:4 - Bedbound  General: Patient is much more responsive today. Answering questions appropriately. Denies any pain. HEENT: Normocephalic, moist mucous membranes, clear oropharynx. Lungs: Coarse breath sounds in upper lobes. Currently receiving SVN treatment. Heart: Tachycardic. Abdomen: Soft, nontender. Musculoskeletal: No edema, cyanosis, or clubbing. Neuro: Cranial nerves grossly intact. Currently sedated.  Skin: No rashes or petechiae noted. Lymphatics: Diffuse lymphadenopathy.  LAB RESULTS:     Component Value Date/Time   NA 145 11/10/2014 1006   NA 141 07/17/2014 1121   K 3.7 11/10/2014 1006   K 2.8* 07/17/2014 1121   CL 116* 11/10/2014 1006   CL 100 07/17/2014 1121   CO2 19* 11/10/2014 1006   CO2 32 07/17/2014 1121   GLUCOSE 82 11/10/2014 1006   GLUCOSE 105* 07/17/2014 1121   BUN 41* 11/10/2014 1006   BUN 9 07/17/2014 1121   CREATININE 1.40* 11/10/2014 1006   CREATININE 1.09 07/17/2014 1121   CALCIUM 8.3* 11/10/2014 1006   CALCIUM 8.9 07/17/2014 1121   PROT 6.6 11/08/2014 2221   PROT 7.3 07/17/2014 1121   ALBUMIN 2.6* 11/08/2014 2221   ALBUMIN 3.1* 07/17/2014 1121   AST 38 11/08/2014 2221   AST 17 07/17/2014 1121   ALT 29 11/08/2014 2221   ALT 12* 07/17/2014 1121   ALKPHOS 194* 11/08/2014 2221   ALKPHOS 79  07/17/2014 1121   BILITOT 1.0 11/08/2014 2221   GFRNONAA 50* 11/10/2014 1006   GFRNONAA >60 02/11/2014 0851   GFRAA 59* 11/10/2014 1006   GFRAA >60 02/11/2014 0851    No results found for: SPEP, UPEP  Lab Results  Component Value Date   WBC 7.0 11/09/2014   NEUTROABS 7.8* 11/08/2014   HGB 8.4* 11/09/2014   HCT 24.9* 11/09/2014   MCV 83.5 11/09/2014   PLT 110* 11/09/2014    @LASTCHEMISTRY @  No results found  for: LABCA2  No components found for: LABCA125   Recent Labs Lab 11/08/14 2221  INR 1.17       Component Value Date/Time   COLORURINE YELLOW* 11/09/2014 0230   APPEARANCEUR CLEAR* 11/09/2014 0230   LABSPEC 1.016 11/09/2014 0230   PHURINE 5.0 11/09/2014 0230   GLUCOSEU 50* 11/09/2014 0230   HGBUR 2+* 11/09/2014 0230   BILIRUBINUR NEGATIVE 11/09/2014 0230   KETONESUR TRACE* 11/09/2014 0230   PROTEINUR 30* 11/09/2014 0230   NITRITE NEGATIVE 11/09/2014 0230   LEUKOCYTESUR NEGATIVE 11/09/2014 0230    STUDIES: Ct Head Wo Contrast  11/09/2014   CLINICAL DATA:  Degenerating mental status, sepsis and history of progressive mantle cell lymphoma.  EXAM: CT HEAD WITHOUT CONTRAST  TECHNIQUE: Contiguous axial images were obtained from the base of the skull through the vertex without intravenous contrast.  COMPARISON:  05/21/2009  FINDINGS: No findings by unenhanced head CT to suggest brain involvement by lymphoma. The brain demonstrates no evidence of hemorrhage, infarction, edema, mass effect, extra-axial fluid collection, hydrocephalus or mass lesion. The skull is unremarkable.  IMPRESSION: Unremarkable head CT.   Electronically Signed   By: Aletta Edouard M.D.   On: 11/09/2014 13:30   Ct Angio Chest Pe W/cm &/or Wo Cm  10/30/2014   CLINICAL DATA:  Tachycardia.  History of mantle cell lymphoma.  EXAM: CT ANGIOGRAPHY CHEST WITH CONTRAST  TECHNIQUE: Multidetector CT imaging of the chest was performed using the standard protocol during bolus administration of intravenous  contrast. Multiplanar CT image reconstructions and MIPs were obtained to evaluate the vascular anatomy.  CONTRAST:  21mL OMNIPAQUE IOHEXOL 350 MG/ML SOLN  COMPARISON:  Head CT 09/16/2014 and chest CT 11/12/2013  FINDINGS: No evidence for pulmonary embolism.  There is a right jugular Port-A-Cath with the tip in the SVC. There are coronary artery calcifications. No significant pericardial or pleural fluid.  Compared to the prior PET-CT, there has been marked enlargement of the left axillary lymphadenopathy. Index lymph node in the left axilla measures 3.0 cm in the short axis on sequence 4, image 44 and previously measured 1.4 cm. There are also enlarged lymph nodes throughout the left sub pectoralis region. Second index lymph node in the left axilla measures 3.2 cm on sequence 4, image 30 and previously measured 1.7 cm in the short axis. Again noted are prominent lymph nodes in the right axilla but these have minimally changed compared to the left side. Small mediastinal lymph nodes have minimally changed. Again noted is adenopathy in the upper abdomen which is incompletely imaged.  The trachea and mainstem bronchi are patent. There are new or increased patchy densities at both lung bases. Some of this could represent atelectasis but cannot exclude an infectious or inflammatory process, particularly in the right lower lobe. Again noted are subtle nodular densities in the right middle lobe on sequence 6, image 104. Again noted is a small irregular nodule in the right middle lobe on sequence 6, image 91 measuring roughly 6 mm. Mild paraseptal emphysema at the lung apices with mild apical scarring. Again noted are small pleural-based nodular densities in left upper lung, best seen on sequence 6, image 24.  No acute bone abnormality. Again noted is a mild compression deformity involving the T9 vertebral body but this is unchanged from 05/24/2014.  Review of the MIP images confirms the above findings.  IMPRESSION:  Negative for pulmonary embolism.  Patchy parenchymal densities in the lower lobes, right side greater the left. Findings could be associated with atelectasis  but there may be an infectious or inflammatory process, particularly in the right lower lobe.  Interval enlargement of the lymphadenopathy in the left axilla and left sub pectoralis region. Findings are compatible with progression of the lymphoma.  Few small pulmonary nodules, particularly in the right middle lobe. Recommend attention to these nodules on follow up imaging.   Electronically Signed   By: Markus Daft M.D.   On: 10/30/2014 16:30   Ct Abdomen Pelvis W Contrast  11/09/2014   CLINICAL DATA:  Diffuse abdominal pain.  Chemotherapy yesterday.  EXAM: CT ABDOMEN AND PELVIS WITH CONTRAST  TECHNIQUE: Multidetector CT imaging of the abdomen and pelvis was performed using the standard protocol following bolus administration of intravenous contrast.  CONTRAST:  18mL OMNIPAQUE IOHEXOL 300 MG/ML  SOLN  COMPARISON:  10/23/2014  FINDINGS: BODY WALL: Partly visible left lower axillary lymphadenopathy, recently evaluated by chest CT 10/30/2014.  There is bulky bilateral inguinal lymphadenopathy which has increased from 10/23/2014, with left inguinal node on image 88 measuring 31 mm short axis, previously 23 mm  LOWER CHEST: Reticular opacities in the lower lungs, greater on the right, is favored atelectasis. No definitive pneumonia. Lower thoracic lymphadenopathy has increased, with a left retrocrural lymph node currently 15 mm on image 23, previously 10 mm.  ABDOMEN/PELVIS:  Liver: No focal abnormality.  Biliary: Cholelithiasis. The gallbladder is distended but there is no inflammatory wall thickening.  Pancreas: Unremarkable.  Spleen: Mild splenomegaly, 15 cm in craniocaudal span. This is increased by approximately 4 cm since previous.  Adrenals: Negative.  Kidneys and ureters: Moderate bilateral hydroureteronephrosis to the level of the pelvis. This is despite  an internal ureteral stent on the left which is well positioned. Symmetric renal enhancement.  Bladder: Thickening of the bladder wall, preferentially to the left, stable from previous. This is indeterminate and will be assessed on followup imaging.  Reproductive: No pathologic findings.  Bowel: No obstruction. No inflammatory bowel wall thickening  Retroperitoneum: There is bulky and diffuse retroperitoneal lymphadenopathy. There has been significant and rapid increased from previous. Index left periaortic lymph node on image 39 measures 88 x 69 mm (previously 59 x 35 mm) on image 39). Left pelvic sidewall lymph node that was previously measured is on image 71 and stable at 58 x 41 mm. There is new enlargement of upper abdominal retroperitoneal lymph nodes . Some cystic change, presumably treatment related, present in the largest nodes, especially in the left pelvis.  Vascular: Mass effect on the left iliac veins by lymphadenopathy, with progressive subcutaneous edema in the left upper thigh.  OSSEOUS: No acute abnormalities.  IMPRESSION: 1. Rapid progression of lymphoma with marked enlargement of lymph nodes compared to 10/23/2014. 2. Bilateral hydroureteronephrosis, new on the right and unchanged on the left (despite a well-positioned left internal ureteral stent). 3. Progressive edema in the upper left thigh. Given severe compression of the left iliac vein by lymphadenopathy, consider Doppler to evaluate for DVT.   Electronically Signed   By: Monte Fantasia M.D.   On: 11/09/2014 02:13   Ct Abdomen Pelvis W Contrast  10/23/2014   CLINICAL DATA:  Swelling to the left upper thigh since last night. Left lower abdominal pain. History of lymphoma with chemotherapy 2015.  EXAM: CT ABDOMEN AND PELVIS WITH CONTRAST  TECHNIQUE: Multidetector CT imaging of the abdomen and pelvis was performed using the standard protocol following bolus administration of intravenous contrast.  CONTRAST:  155mL OMNIPAQUE IOHEXOL 300  MG/ML SOLN, 28mL OMNIPAQUE IOHEXOL 240 MG/ML SOLN  COMPARISON:  PET-CT scan 09/16/2014  FINDINGS: Dependent atelectasis in the lung bases.  Cholelithiasis with multiple stones in the gallbladder. No gallbladder wall thickening or infiltration. Multiple accessory spleens. Liver, spleen, pancreas, adrenal glands, and inferior vena cava are unremarkable. Calcification of the abdominal aorta without aneurysm. There is fairly prominent retroperitoneal and periaortic lymphadenopathy extending all the way down into the pelvis with diffuse bilateral iliac chain lymphadenopathy, greater on the left. Left periaortic lymph nodes measure up to 5.9 by 3.5 cm diameter. Left pelvic lymph nodes measure up to 5 by 4.2 cm diameter. Scattered lymph nodes in the celiac axis are upper limits of normal in size. Lymphadenopathy demonstrated in the left groin. Diffuse soft tissue edema in the left upper leg without discrete fluid collection. There is hydronephrosis and hydroureter on the left probably due to extrinsic compression from left pelvic lymphadenopathy. Lymph nodes in all areas demonstrate enlargement since the previous PET-CT scan. Findings suggest progression of lymphoma.  Stomach, small bowel, and colon are not abnormally distended. No free air or free fluid in the abdomen. Abdominal wall musculature appears intact.  Pelvis: Prostate gland is not enlarged. Bladder wall is mildly thickened, possibly indicating infection. Appendix is normal. No evidence of diverticulitis. Degenerative changes of the spine. No destructive bone lesions.  IMPRESSION: Significant interval enlargement of diffuse retroperitoneal, bilateral pelvic, and left groin lymph nodes. Lymphadenopathy is most prominent on the left. Diffuse edema demonstrated in the left upper leg. Left renal hydronephrosis and hydroureter probably due to extrinsic compression by lymphadenopathy. Findings suggest progression of lymphoma.   Electronically Signed   By: Lucienne Capers M.D.   On: 10/23/2014 01:29   US Venous Img Lower Unilateral Left  10/22/2014   CLINICAL DATA:  Left leg swelling for 2 days. Patient is on aspirin.  EXAM: Left LOWER EXTREMITY VENOUS DOPPLER ULTRASOUND  TECHNIQUE: Gray-scale sonography with graded compression, as well as color Doppler and duplex ultrasound were performed to evaluate the lower extremity deep venous systems from the level of the common femoral vein and including the common femoral, femoral, profunda femoral, popliteal and calf veins including the posterior tibial, peroneal and gastrocnemius veins when visible. The superficial great saphenous vein was also interrogated. Spectral Doppler was utilized to evaluate flow at rest and with distal augmentation maneuvers in the common femoral, femoral and popliteal veins.  COMPARISON:  None.  FINDINGS: Contralateral Common Femoral Vein: Respiratory phasicity is normal and symmetric with the symptomatic side. No evidence of thrombus. Normal compressibility.  Common Femoral Vein: No evidence of thrombus. Normal compressibility, respiratory phasicity and response to augmentation.  Saphenofemoral Junction: No evidence of thrombus. Normal compressibility and flow on color Doppler imaging.  Profunda Femoral Vein: No evidence of thrombus. Normal compressibility and flow on color Doppler imaging.  Femoral Vein: No evidence of thrombus. Normal compressibility, respiratory phasicity and response to augmentation.  Popliteal Vein: No evidence of thrombus. Normal compressibility, respiratory phasicity and response to augmentation.  Calf Veins: Peroneal vein is not visualized. Visualized posterior tibial vein demonstrates no evidence of thrombus. Normal compressibility and flow on color Doppler imaging.  Superficial Great Saphenous Vein: No evidence of thrombus. Normal compressibility and flow on color Doppler imaging.  Venous Reflux:  None.  Other Findings: Incidental note of prominent lymph nodes in the left  groin area. Central flow and fatty hilum are demonstrated. This is likely reactive.  IMPRESSION: No evidence of deep venous thrombosis.   Electronically Signed   By: Lucienne Capers M.D.   On:  10/22/2014 22:45   [place] Extrem Low Left Comp  11/09/2014   CLINICAL DATA:  Left calf swelling and lower extremity pain. History of lymphoma.  EXAM: Left LOWER EXTREMITY VENOUS DOPPLER ULTRASOUND  TECHNIQUE: Gray-scale sonography with graded compression, as well as color Doppler and duplex ultrasound were performed to evaluate the lower extremity deep venous systems from the level of the common femoral vein and including the common femoral, femoral, profunda femoral, popliteal and calf veins including the posterior tibial, peroneal and gastrocnemius veins when visible. The superficial great saphenous vein was also interrogated. Spectral Doppler was utilized to evaluate flow at rest and with distal augmentation maneuvers in the common femoral, femoral and popliteal veins.  COMPARISON:  None.  FINDINGS: Contralateral Common Femoral Vein: Respiratory phasicity is normal and symmetric with the symptomatic side. No evidence of thrombus. Normal compressibility.  Common Femoral Vein: No evidence of thrombus. Normal compressibility, respiratory phasicity and response to augmentation.  Saphenofemoral Junction - Superficial Great Saphenous Vein: Due to patient pain, the patient requested that the examination be terminated therefore the superficial femoral vein through popliteal trifurcation could not be evaluated.  IMPRESSION: No evidence of deep venous thrombosis involving the upper common femoral vein. The patient requested that the examination be terminated and did not allow the sonographer to examine the lower extremity venous system from the level of the saphenous femoral junction through the popliteal trifurcation.   Electronically Signed   By: Conchita Paris M.D.   On: 11/09/2014 09:40   Dg Chest Port 1 View  11/08/2014    CLINICAL DATA:  Acute onset of sepsis.  Initial encounter.  EXAM: PORTABLE CHEST - 1 VIEW  COMPARISON:  Chest radiograph performed 11/12/2013, and CTA of the chest performed 10/30/2014  FINDINGS: The lungs are well-aerated. Mild vascular congestion is noted, with mild right basilar atelectasis. There is no evidence of pleural effusion or pneumothorax.  The cardiomediastinal silhouette is within normal limits. No acute osseous abnormalities are seen. A right-sided chest port is noted ending about the mid SVC.  IMPRESSION: Mild vascular congestion, with mild right basilar atelectasis. Lungs otherwise clear.   Electronically Signed   By: Garald Balding M.D.   On: 11/08/2014 23:06   Dg Abd 2 Views  10/25/2014   CLINICAL DATA:  Ureteral stent  EXAM: ABDOMEN - 2 VIEW  COMPARISON:  CT 10/23/2014  FINDINGS: Double-J left ureteral stent extends from the left renal pelvis to the bladder in expected location. Oral contrast from recent CT noted within the colon. Mild bibasilar atelectasis.  IMPRESSION: Left ureteral stent in expected location.   Electronically Signed   By: Suzy Bouchard M.D.   On: 10/25/2014 13:42    ASSESSMENT:  Caleb Francis is a 69 y.o. male with progressive mantle cell lymphoma admitted with intractable abdominal pain, AMS, Sepsis and fever.  PLAN:  1. Hematology/Oncology: enlarging adenopathy on imaging.Completed first dose of RICE chemotherapy Friday with pump removal in CCU after admission to hospital.On allopurinol for tumor lysis. Abdominal pain had continued to worsen with rapidly increasing disease. He appears to be rather comfortable now. He is much more alert and denies any complaints at present time.  2.  Hydronephrosis: Now with bilateral hydronephrosis. Currently has a stent in place on right. Urology has evaluated.  3. Pain/Toxicology: Pain appears controlled at present time but has recently received ativan as well. Currently on Hydromorphone IV.  4.  Sepsis:  Continue with current antibiotic coverage with vancomycin and zosyn.   Oncology will continue  to follow.   Dr. Grayland Ormond was available for consultation and review of plan of care for this patient.  Evlyn Kanner, NP   11/10/2014 2:05 PM

## 2014-11-10 NOTE — Progress Notes (Signed)
[facility] [place] NAME: Caleb Francis    MR#:  [id number]  DATE OF BIRTH:  11/30/1945  SUBJECTIVE:  CHIEF COMPLAINT:   Chief Complaint  Patient presents with  . Extremity Pain  - continues to complain of pain all over - confused - receiving prn dilaudid and ativan with good results - daughter and son at bedside  REVIEW OF SYSTEMS:  Review of Systems  Unable to perform ROS: acuity of condition  Confused at this time Just complaining of pain all over.  DRUG ALLERGIES:  No Known Allergies  VITALS:  Blood pressure 150/84, pulse 116, temperature 98.3 F (36.8 C), temperature source Axillary, resp. rate 33, height 6\' 1"  (1.854 m), weight 79.9 kg (176 lb 2.4 oz), SpO2 98 %.  PHYSICAL EXAMINATION:  Physical Exam  GENERAL:  69 y.o.-year-old patient lying in the bed, distress secondary to pain all over. Confused, ill appearing  EYES: Pupils equal, round, reactive to light and accommodation. No scleral icterus. Extraocular muscles intact.  HEENT: Head atraumatic, normocephalic. Oropharynx and nasopharynx clear. Extremely dry mucous membranes. NECK:  Supple, no jugular venous distention. No thyroid enlargement, no tenderness.  LUNGS: Normal breath sounds bilaterally, no wheezing, rales,rhonchi or crepitation. No use of accessory muscles of respiration.  CARDIOVASCULAR: S1, S2 normal. No murmurs, rubs, or gallops.  ABDOMEN: Distended, generalized tenderness, or worse in the left flank and left lateral abdominal region.  Bowel sounds present. No organomegaly or mass.  EXTREMITIES: Left leg 2+ edema, right leg no edema NEUROLOGIC: Cranial nerves II through XII are intact. Generalized weakness present. No focal weakness noted. Sensation is intact. Gait not checked.  Not following commands PSYCHIATRIC: The patient is alert and but disoriented likely from pain medication. Arousable and answers some simple questions.  SKIN: No obvious  rash, lesion, or ulcer.    LABORATORY PANEL:   CBC  Recent Labs Lab 11/09/14 0559  WBC 7.0  HGB 8.4*  HCT 24.9*  PLT 110*   ------------------------------------------------------------------------------------------------------------------  Chemistries   Recent Labs Lab 11/08/14 2221 11/08/14 2247 11/09/14 0559  NA 141  --  140  K 5.0  --  4.5  CL 107  --  110  CO2 23  --  20*  GLUCOSE 76  --  71  BUN 22*  --  23*  CREATININE 1.12  --  1.09  CALCIUM 9.1  --  7.9*  MG  --  2.2  --   AST 38  --   --   ALT 29  --   --   ALKPHOS 194*  --   --   BILITOT 1.0  --   --    ------------------------------------------------------------------------------------------------------------------  Cardiac Enzymes  Recent Labs Lab 11/08/14 2221  TROPONINI 0.07*   ------------------------------------------------------------------------------------------------------------------  RADIOLOGY:  Ct Head Wo Contrast  11/09/2014   CLINICAL DATA:  Degenerating mental status, sepsis and history of progressive mantle cell lymphoma.  EXAM: CT HEAD WITHOUT CONTRAST  TECHNIQUE: Contiguous axial images were obtained from the base of the skull through the vertex without intravenous contrast.  COMPARISON:  05/21/2009  FINDINGS: No findings by unenhanced head CT to suggest brain involvement by lymphoma. The brain demonstrates no evidence of hemorrhage, infarction, edema, mass effect, extra-axial fluid collection, hydrocephalus or mass lesion. The skull is unremarkable.  IMPRESSION: Unremarkable head CT.   Electronically Signed   By: Aletta Edouard M.D.   On: 11/09/2014 13:30   Ct Abdomen Pelvis W Contrast  11/09/2014  CLINICAL DATA:  Diffuse abdominal pain.  Chemotherapy yesterday.  EXAM: CT ABDOMEN AND PELVIS WITH CONTRAST  TECHNIQUE: Multidetector CT imaging of the abdomen and pelvis was performed using the standard protocol following bolus administration of intravenous contrast.  CONTRAST:  135mL  OMNIPAQUE IOHEXOL 300 MG/ML  SOLN  COMPARISON:  10/23/2014  FINDINGS: BODY WALL: Partly visible left lower axillary lymphadenopathy, recently evaluated by chest CT 10/30/2014.  There is bulky bilateral inguinal lymphadenopathy which has increased from 10/23/2014, with left inguinal node on image 88 measuring 31 mm short axis, previously 23 mm  LOWER CHEST: Reticular opacities in the lower lungs, greater on the right, is favored atelectasis. No definitive pneumonia. Lower thoracic lymphadenopathy has increased, with a left retrocrural lymph node currently 15 mm on image 23, previously 10 mm.  ABDOMEN/PELVIS:  Liver: No focal abnormality.  Biliary: Cholelithiasis. The gallbladder is distended but there is no inflammatory wall thickening.  Pancreas: Unremarkable.  Spleen: Mild splenomegaly, 15 cm in craniocaudal span. This is increased by approximately 4 cm since previous.  Adrenals: Negative.  Kidneys and ureters: Moderate bilateral hydroureteronephrosis to the level of the pelvis. This is despite an internal ureteral stent on the left which is well positioned. Symmetric renal enhancement.  Bladder: Thickening of the bladder wall, preferentially to the left, stable from previous. This is indeterminate and will be assessed on followup imaging.  Reproductive: No pathologic findings.  Bowel: No obstruction. No inflammatory bowel wall thickening  Retroperitoneum: There is bulky and diffuse retroperitoneal lymphadenopathy. There has been significant and rapid increased from previous. Index left periaortic lymph node on image 39 measures 88 x 69 mm (previously 59 x 35 mm) on image 39). Left pelvic sidewall lymph node that was previously measured is on image 71 and stable at 58 x 41 mm. There is new enlargement of upper abdominal retroperitoneal lymph nodes . Some cystic change, presumably treatment related, present in the largest nodes, especially in the left pelvis.  Vascular: Mass effect on the left iliac veins by  lymphadenopathy, with progressive subcutaneous edema in the left upper thigh.  OSSEOUS: No acute abnormalities.  IMPRESSION: 1. Rapid progression of lymphoma with marked enlargement of lymph nodes compared to 10/23/2014. 2. Bilateral hydroureteronephrosis, new on the right and unchanged on the left (despite a well-positioned left internal ureteral stent). 3. Progressive edema in the upper left thigh. Given severe compression of the left iliac vein by lymphadenopathy, consider Doppler to evaluate for DVT.   Electronically Signed   By: Monte Fantasia M.D.   On: 11/09/2014 02:13   [place] Extrem Low Left Comp  11/09/2014   CLINICAL DATA:  Left calf swelling and lower extremity pain. History of lymphoma.  EXAM: Left LOWER EXTREMITY VENOUS DOPPLER ULTRASOUND  TECHNIQUE: Gray-scale sonography with graded compression, as well as color Doppler and duplex ultrasound were performed to evaluate the lower extremity deep venous systems from the level of the common femoral vein and including the common femoral, femoral, profunda femoral, popliteal and calf veins including the posterior tibial, peroneal and gastrocnemius veins when visible. The superficial great saphenous vein was also interrogated. Spectral Doppler was utilized to evaluate flow at rest and with distal augmentation maneuvers in the common femoral, femoral and popliteal veins.  COMPARISON:  None.  FINDINGS: Contralateral Common Femoral Vein: Respiratory phasicity is normal and symmetric with the symptomatic side. No evidence of thrombus. Normal compressibility.  Common Femoral Vein: No evidence of thrombus. Normal compressibility, respiratory phasicity and response to augmentation.  Saphenofemoral Junction - Superficial  Great Saphenous Vein: Due to patient pain, the patient requested that the examination be terminated therefore the superficial femoral vein through popliteal trifurcation could not be evaluated.  IMPRESSION: No evidence of deep venous thrombosis  involving the upper common femoral vein. The patient requested that the examination be terminated and did not allow the sonographer to examine the lower extremity venous system from the level of the saphenous femoral junction through the popliteal trifurcation.   Electronically Signed   By: Conchita Paris M.D.   On: 11/09/2014 09:40   Dg Chest Port 1 View  11/08/2014   CLINICAL DATA:  Acute onset of sepsis.  Initial encounter.  EXAM: PORTABLE CHEST - 1 VIEW  COMPARISON:  Chest radiograph performed 11/12/2013, and CTA of the chest performed 10/30/2014  FINDINGS: The lungs are well-aerated. Mild vascular congestion is noted, with mild right basilar atelectasis. There is no evidence of pleural effusion or pneumothorax.  The cardiomediastinal silhouette is within normal limits. No acute osseous abnormalities are seen. A right-sided chest port is noted ending about the mid SVC.  IMPRESSION: Mild vascular congestion, with mild right basilar atelectasis. Lungs otherwise clear.   Electronically Signed   By: Garald Balding M.D.   On: 11/08/2014 23:06    EKG:   Orders placed or performed during the hospital encounter of 11/08/14  . EKG 12-Lead  . EKG 12-Lead    ASSESSMENT AND PLAN:   69 year old African-American male with past medical history significant for mantle cell lymphoma with recurrence, started on chemotherapy 3 days ago, recent admission for left-sided hydronephrosis status post ureteral stent placement last week, comes to the hospital secondary to worsening of abdominal pain and fevers.  #1 sepsis-Likely intraabdominal. Blood cultures are pending.  - Continue IV vancomycin and Zosyn at this time. - Has significant abdominal pain. Not sure if the recently placed stent is the source of infection. - Urology has been consulted.  #2 abdominal pain-likely from worsening lymphoma. Now has bilateral hydronephrosis. Pain medications as needed.  #3 left lower extremity edema-likely compression of the  iliac vein by a significant retroperitoneal lymphadenopathy. Dopplers have been ordered to rule out DVT.  #4 mantle cell lymphoma-diagnosed in September 2014. Now CT with progression of the lymphadenopathy inspite of new chemotherapy. Oncology has been consulted. poor prognosis per oncology Discussed extensively with daughter and son at bedside- they want to do IV ABX for 1 more day- but focus more on patients comfort if needed. If no improvement- plan to change to comfort care and hospice involvement - Patient is DNR - Transfer to oncology floor  #5 anemia-anemia of chronic disease. Likely acute from recent chemotherapy. Monitor. No acute indication for transfusion at this time.  #6 gout-hold allopurinol if mental status is not stable.  #7 DVT prophylaxis- subcutaneous heparin.  #6 hypertension-hold all blood pressure medications, low normal blood pressure due to sepsis. Change his metoprolol to IV scheduled for his tachycardia.   All the records are reviewed and case discussed with Care Management/Social Workerr. Management plans discussed with the patient, family and they are in agreement.  CODE STATUS: Full code  TOTAL TIME SPENT IN TAKING CARE OF THIS PATIENT: 42 minutes.   POSSIBLE D/C IN 3 DAYS, DEPENDING ON CLINICAL CONDITION.   Gladstone Lighter M.D on 11/10/2014 at 9:12 AM  Between 7am to 6pm - Pager - [phone]  After 6pm go to [email or web address] - password [email or web address] [facility]  Office  [phone]  CC: Primary care physician; No PCP Per Patient

## 2014-11-10 NOTE — Progress Notes (Signed)
Pt continues throughout shift to be confused, talking about balls, throwing and catching balls, etc. Family adamant that pt is this way due to pain. Discussed management of patient and comfort, continued to alternate ativan and dilaudid as ordered, pt would sleep for apx. 3-4 hours then wake up with not s/s of pain but of more confusion, family insistent that patient be medicated to sleep. MD aware of issue and will continue care as provided for patient comfort. Will continue to reassess.

## 2014-11-10 NOTE — Progress Notes (Signed)
Subjective:  1 - Bilateral Malignant Hydronephrosis - New left hydro 10/24/14 from bulky retroperitneal adenopathy from rapidly progressive lymphoma. Cr normal. 6x26 ureteral stent placed by Dr. Jeffie Pollock 10/24/2014. New CT 11/09/14 with rapidly progressive adenoapthy and now new right sided hydro. Cr remains normal at admission (1.2). He is chemo as recently as 5/26 for his lymphoma. No new right flank pain.   2 - Post Chemo Fever of Unknown Origin - Pr with fevers >101 5/28 prompting ER eval. Currently on chemo last dose 5/26 per report. UA without bacteruria. UCX negative to date. BCX negative to date. No on empiric vanc + zosyn.   3 - Abdominal Pain - Pt with severe non-localizing abdominal pain at presentation. NO frank CVAT or localizing pain on exam. Requiring narcotic pain meds round the clock.   Today Everhett is feeling somewhat improved. Decreased pain and mental acuity improved somewhat at present, now AOx2 v. completely disoriented yesterday.  Cr with slight bump today (1.4), though he is in IV vanco.  Objective: Vital signs in last 24 hours: Temp:  [97.9 F (36.6 C)-99.5 F (37.5 C)] 98.6 F (37 C) (05/29 1013) Pulse Rate:  [109-124] 114 (05/29 1013) Resp:  [20-37] 21 (05/29 0900) BP: (102-150)/(62-84) 129/72 mmHg (05/29 1013) SpO2:  [92 %-100 %] 98 % (05/29 1013)    Intake/Output from previous day: 05/28 0701 - 05/29 0700 In: 1796.3 [I.V.:946.3; IV Piggyback:250] Out: 2125 [Urine:2125] Intake/Output this shift: Total I/O In: 200 [IV Piggyback:200] Out: 825 [Urine:825]  General appearance: alert, cooperative, appears stated age and slowed mentation Head: Normocephalic, without obvious abnormality, atraumatic Nose: Nares normal. Septum midline. Mucosa normal. No drainage or sinus tenderness. Throat: lips, mucosa, and tongue normal; teeth and gums normal Neck: supple, symmetrical, trachea midline Back: symmetric, no curvature. ROM normal. No CVA tenderness. Resp:  non-labored Cardio: regular tachycardia GI: soft, non-tender; bowel sounds normal; no masses,  no organomegaly Male genitalia: normal, foley c/d/i with clear urine.  Extremities: extremities normal, atraumatic, no cyanosis or edema Neurologic: Grossly normal  Lab Results:   Recent Labs  11/08/14 2221 11/09/14 0559  WBC 8.6 7.0  HGB 9.4* 8.4*  HCT 28.2* 24.9*  PLT 133* 110*   BMET  Recent Labs  11/09/14 0559 11/10/14 1006  NA 140 145  K 4.5 3.7  CL 110 116*  CO2 20* 19*  GLUCOSE 71 82  BUN 23* 41*  CREATININE 1.09 1.40*  CALCIUM 7.9* 8.3*   PT/INR  Recent Labs  11/08/14 2221  LABPROT 15.1*  INR 1.17   ABG  Recent Labs  11/09/14 1030  PHART 7.39  HCO3 19.4*    Studies/Results: Ct Head Wo Contrast  11/09/2014   CLINICAL DATA:  Degenerating mental status, sepsis and history of progressive mantle cell lymphoma.  EXAM: CT HEAD WITHOUT CONTRAST  TECHNIQUE: Contiguous axial images were obtained from the base of the skull through the vertex without intravenous contrast.  COMPARISON:  05/21/2009  FINDINGS: No findings by unenhanced head CT to suggest brain involvement by lymphoma. The brain demonstrates no evidence of hemorrhage, infarction, edema, mass effect, extra-axial fluid collection, hydrocephalus or mass lesion. The skull is unremarkable.  IMPRESSION: Unremarkable head CT.   Electronically Signed   By: Aletta Edouard M.D.   On: 11/09/2014 13:30   Ct Abdomen Pelvis W Contrast  11/09/2014   CLINICAL DATA:  Diffuse abdominal pain.  Chemotherapy yesterday.  EXAM: CT ABDOMEN AND PELVIS WITH CONTRAST  TECHNIQUE: Multidetector CT imaging of the abdomen and pelvis was performed  using the standard protocol following bolus administration of intravenous contrast.  CONTRAST:  127mL OMNIPAQUE IOHEXOL 300 MG/ML  SOLN  COMPARISON:  10/23/2014  FINDINGS: BODY WALL: Partly visible left lower axillary lymphadenopathy, recently evaluated by chest CT 10/30/2014.  There is bulky  bilateral inguinal lymphadenopathy which has increased from 10/23/2014, with left inguinal node on image 88 measuring 31 mm short axis, previously 23 mm  LOWER CHEST: Reticular opacities in the lower lungs, greater on the right, is favored atelectasis. No definitive pneumonia. Lower thoracic lymphadenopathy has increased, with a left retrocrural lymph node currently 15 mm on image 23, previously 10 mm.  ABDOMEN/PELVIS:  Liver: No focal abnormality.  Biliary: Cholelithiasis. The gallbladder is distended but there is no inflammatory wall thickening.  Pancreas: Unremarkable.  Spleen: Mild splenomegaly, 15 cm in craniocaudal span. This is increased by approximately 4 cm since previous.  Adrenals: Negative.  Kidneys and ureters: Moderate bilateral hydroureteronephrosis to the level of the pelvis. This is despite an internal ureteral stent on the left which is well positioned. Symmetric renal enhancement.  Bladder: Thickening of the bladder wall, preferentially to the left, stable from previous. This is indeterminate and will be assessed on followup imaging.  Reproductive: No pathologic findings.  Bowel: No obstruction. No inflammatory bowel wall thickening  Retroperitoneum: There is bulky and diffuse retroperitoneal lymphadenopathy. There has been significant and rapid increased from previous. Index left periaortic lymph node on image 39 measures 88 x 69 mm (previously 59 x 35 mm) on image 39). Left pelvic sidewall lymph node that was previously measured is on image 71 and stable at 58 x 41 mm. There is new enlargement of upper abdominal retroperitoneal lymph nodes . Some cystic change, presumably treatment related, present in the largest nodes, especially in the left pelvis.  Vascular: Mass effect on the left iliac veins by lymphadenopathy, with progressive subcutaneous edema in the left upper thigh.  OSSEOUS: No acute abnormalities.  IMPRESSION: 1. Rapid progression of lymphoma with marked enlargement of lymph nodes  compared to 10/23/2014. 2. Bilateral hydroureteronephrosis, new on the right and unchanged on the left (despite a well-positioned left internal ureteral stent). 3. Progressive edema in the upper left thigh. Given severe compression of the left iliac vein by lymphadenopathy, consider Doppler to evaluate for DVT.   Electronically Signed   By: Monte Fantasia M.D.   On: 11/09/2014 02:13   [place] Extrem Low Left Comp  11/09/2014   CLINICAL DATA:  Left calf swelling and lower extremity pain. History of lymphoma.  EXAM: Left LOWER EXTREMITY VENOUS DOPPLER ULTRASOUND  TECHNIQUE: Gray-scale sonography with graded compression, as well as color Doppler and duplex ultrasound were performed to evaluate the lower extremity deep venous systems from the level of the common femoral vein and including the common femoral, femoral, profunda femoral, popliteal and calf veins including the posterior tibial, peroneal and gastrocnemius veins when visible. The superficial great saphenous vein was also interrogated. Spectral Doppler was utilized to evaluate flow at rest and with distal augmentation maneuvers in the common femoral, femoral and popliteal veins.  COMPARISON:  None.  FINDINGS: Contralateral Common Femoral Vein: Respiratory phasicity is normal and symmetric with the symptomatic side. No evidence of thrombus. Normal compressibility.  Common Femoral Vein: No evidence of thrombus. Normal compressibility, respiratory phasicity and response to augmentation.  Saphenofemoral Junction - Superficial Great Saphenous Vein: Due to patient pain, the patient requested that the examination be terminated therefore the superficial femoral vein through popliteal trifurcation could not be evaluated.  IMPRESSION:  No evidence of deep venous thrombosis involving the upper common femoral vein. The patient requested that the examination be terminated and did not allow the sonographer to examine the lower extremity venous system from the level of the  saphenous femoral junction through the popliteal trifurcation.   Electronically Signed   By: Conchita Paris M.D.   On: 11/09/2014 09:40   Dg Chest Port 1 View  11/08/2014   CLINICAL DATA:  Acute onset of sepsis.  Initial encounter.  EXAM: PORTABLE CHEST - 1 VIEW  COMPARISON:  Chest radiograph performed 11/12/2013, and CTA of the chest performed 10/30/2014  FINDINGS: The lungs are well-aerated. Mild vascular congestion is noted, with mild right basilar atelectasis. There is no evidence of pleural effusion or pneumothorax.  The cardiomediastinal silhouette is within normal limits. No acute osseous abnormalities are seen. A right-sided chest port is noted ending about the mid SVC.  IMPRESSION: Mild vascular congestion, with mild right basilar atelectasis. Lungs otherwise clear.   Electronically Signed   By: Garald Balding M.D.   On: 11/08/2014 23:06    Anti-infectives: Anti-infectives    Start     Dose/Rate Route Frequency Ordered Stop   11/09/14 1200  vancomycin (VANCOCIN) IVPB 1000 mg/200 mL premix     1,000 mg 200 mL/hr over 60 Minutes Intravenous Every 12 hours 11/09/14 0457     11/09/14 0600  piperacillin-tazobactam (ZOSYN) IVPB 3.375 g     3.375 g 12.5 mL/hr over 240 Minutes Intravenous 3 times per day 11/09/14 0457     11/08/14 2230  piperacillin-tazobactam (ZOSYN) IVPB 3.375 g     3.375 g 100 mL/hr over 30 Minutes Intravenous  Once 11/08/14 2228 11/08/14 2351   11/08/14 2230  vancomycin (VANCOCIN) IVPB 1000 mg/200 mL premix     1,000 mg 200 mL/hr over 60 Minutes Intravenous  Once 11/08/14 2228 11/09/14 0102      Assessment/Plan:  1 - Bilateral Malignant Hydronephrosis - due to lymphoma. His GFR is normal. If patient is going to receive more chemo he may benefit from maximal renal drainage from placement of right ureteral stent. As renal function normal and prognosis guarded, observaiton is reasonable should pt desire more palliative mode of therapy.  2 - Post Chemo Fever of Unknown  Origin - Agree with empiric ABX. UA without bacteruria and UCX/BCX negative. ? Tumor lysis syndrome given volume of tumor and time course following chemo.  3 - Abdominal Pain - Not frank CVAT or any other localizing source on exam. Most likely cancer-related from progressive lymphoma and its treatment (tumor lysis).   4 - Will follow. Agree with AM labs tomorrow. Would consider JJ stent if Cr sig elevated and pt / family desire aggressive care.   [facility], Markeith Jue 11/10/2014

## 2014-11-11 LAB — CBC
HEMATOCRIT: 21.9 % — AB (ref 40.0–52.0)
Hemoglobin: 7.4 g/dL — ABNORMAL LOW (ref 13.0–18.0)
MCH: 27.6 pg (ref 26.0–34.0)
MCHC: 33.8 g/dL (ref 32.0–36.0)
MCV: 81.6 fL (ref 80.0–100.0)
Platelets: 35 10*3/uL — ABNORMAL LOW (ref 150–440)
RBC: 2.69 MIL/uL — ABNORMAL LOW (ref 4.40–5.90)
RDW: 20 % — AB (ref 11.5–14.5)
WBC: 5.7 10*3/uL (ref 3.8–10.6)

## 2014-11-11 LAB — BASIC METABOLIC PANEL
Anion gap: 9 (ref 5–15)
BUN: 44 mg/dL — AB (ref 6–20)
CO2: 20 mmol/L — ABNORMAL LOW (ref 22–32)
Calcium: 8.1 mg/dL — ABNORMAL LOW (ref 8.9–10.3)
Chloride: 116 mmol/L — ABNORMAL HIGH (ref 101–111)
Creatinine, Ser: 1.59 mg/dL — ABNORMAL HIGH (ref 0.61–1.24)
GFR calc Af Amer: 50 mL/min — ABNORMAL LOW (ref >60)
GFR, EST NON AFRICAN AMERICAN: 43 mL/min — AB (ref >60)
Glucose, Bld: 134 mg/dL — ABNORMAL HIGH (ref 65–99)
Potassium: 3 mmol/L — ABNORMAL LOW (ref 3.5–5.1)
Sodium: 145 mmol/L (ref 135–145)

## 2014-11-11 LAB — VANCOMYCIN, TROUGH: Vancomycin Tr: 18 ug/mL (ref 10–20)

## 2014-11-11 MED ORDER — SENNOSIDES-DOCUSATE SODIUM 8.6-50 MG PO TABS
2.0000 | ORAL_TABLET | Freq: Two times a day (BID) | ORAL | Status: DC
  Administered 2014-11-11 – 2014-11-19 (×17): 2 via ORAL
  Filled 2014-11-11 (×17): qty 2

## 2014-11-11 MED ORDER — LORAZEPAM 2 MG/ML IJ SOLN
2.0000 mg | Freq: Once | INTRAMUSCULAR | Status: AC
  Administered 2014-11-11: 04:00:00 2 mg via INTRAVENOUS
  Filled 2014-11-11: qty 1

## 2014-11-11 MED ORDER — HALOPERIDOL LACTATE 5 MG/ML IJ SOLN
2.0000 mg | Freq: Four times a day (QID) | INTRAMUSCULAR | Status: DC | PRN
  Administered 2014-11-11: 2 mg via INTRAVENOUS
  Filled 2014-11-11: qty 1

## 2014-11-11 MED ORDER — POLYETHYLENE GLYCOL 3350 17 G PO PACK
17.0000 g | PACK | Freq: Every day | ORAL | Status: DC | PRN
  Administered 2014-11-11: 21:00:00 17 g via ORAL
  Filled 2014-11-11: qty 1

## 2014-11-11 MED ORDER — ALLOPURINOL 100 MG PO TABS
300.0000 mg | ORAL_TABLET | Freq: Every day | ORAL | Status: DC
  Administered 2014-11-11 – 2014-11-19 (×9): 300 mg via ORAL
  Filled 2014-11-11 (×9): qty 3

## 2014-11-11 NOTE — Care Management (Signed)
Family at the bedside. Mr. Holzschuh sleeping. IV Zosyn and Vancomycin continues. Full liquid ordered. Pending speech evaluation.  Dr. Tressia Miners ordered [place] consult.  Shelbie Ammons RN MSN Care Management [phone]

## 2014-11-11 NOTE — Progress Notes (Signed)
Subjective:  1 - Bilateral Malignant Hydronephrosis - New left hydro 10/24/14 from bulky retroperitneal adenopathy from rapidly progressive lymphoma. Cr normal. 6x26 ureteral stent placed by Dr. Jeffie Pollock 10/24/2014. New CT 11/09/14 with rapidly progressive adenoapthy and now new right sided hydro. Cr remains normal at admission (1.2). He is chemo as recently as 5/26 for his lymphoma. No new right flank pain.   2 - Post Chemo Fever of Unknown Origin - Pr with fevers >101 5/28 prompting ER eval. Currently on chemo last dose 5/26 per report. UA without bacteruria. UCX negative to date. BCX negative to date. No on empiric vanc + zosyn with down-trend of fever curve.   3 - Abdominal Pain - Pt with severe non-localizing abdominal pain at presentation. NO frank CVAT or localizing pain on exam. Requiring narcotic pain meds round the clock.   Today Caleb Francis is stable. His mental status waxes and wanes. Oncology feels overall prognosis poor. Family most interested in transition to more palliate mode of care.   Objective: Vital signs in last 24 hours: Temp:  [97.8 F (36.6 C)-99.1 F (37.3 C)] 97.8 F (36.6 C) (05/30 0526) Pulse Rate:  [124-128] 124 (05/30 0526) Resp:  [20-22] 22 (05/30 0526) BP: (138-146)/(77-82) 138/81 mmHg (05/30 0526) SpO2:  [95 %-100 %] 100 % (05/30 0526) Weight:  [77.792 kg (171 lb 8 oz)] 77.792 kg (171 lb 8 oz) (05/30 0549) Last BM Date:  (last bowel movement unknown)  Intake/Output from previous day: 05/29 0701 - 05/30 0700 In: 845 [I.V.:595; IV Piggyback:250] Out: 3900 [Urine:3900] Intake/Output this shift:    General appearance: cooperative, slowed mentation and family at bedside Head: Normocephalic, without obvious abnormality, atraumatic Nose: Nares normal. Septum midline. Mucosa normal. No drainage or sinus tenderness. Throat: lips, mucosa, and tongue normal; teeth and gums normal Neck: supple, symmetrical, trachea midline Back: symmetric, no curvature. ROM normal.  No CVA tenderness. Resp: non-labored, but with mild tachypnea Cardio: stable tachycardia GI: soft, non-tender; bowel sounds normal; no masses,  no organomegaly Male genitalia: normal, foley c/d/i with clear yellow urine.  Extremities: extremities normal, atraumatic, no cyanosis or edema Skin: Skin color, texture, turgor normal. No rashes or lesions Lymph nodes: Cervical, supraclavicular, and axillary nodes normal. Neurologic: Mental status: AOx2 at present but LOC up and down.   Lab Results:   Recent Labs  11/09/14 0559 11/11/14 0603  WBC 7.0 5.7  HGB 8.4* 7.4*  HCT 24.9* 21.9*  PLT 110* 35*   BMET  Recent Labs  11/10/14 1006 11/11/14 0435  NA 145 145  K 3.7 3.0*  CL 116* 116*  CO2 19* 20*  GLUCOSE 82 134*  BUN 41* 44*  CREATININE 1.40* 1.59*  CALCIUM 8.3* 8.1*   PT/INR  Recent Labs  11/08/14 2221  LABPROT 15.1*  INR 1.17   ABG  Recent Labs  11/09/14 1030  PHART 7.39  HCO3 19.4*    Studies/Results: Ct Head Wo Contrast  11/09/2014   CLINICAL DATA:  Degenerating mental status, sepsis and history of progressive mantle cell lymphoma.  EXAM: CT HEAD WITHOUT CONTRAST  TECHNIQUE: Contiguous axial images were obtained from the base of the skull through the vertex without intravenous contrast.  COMPARISON:  05/21/2009  FINDINGS: No findings by unenhanced head CT to suggest brain involvement by lymphoma. The brain demonstrates no evidence of hemorrhage, infarction, edema, mass effect, extra-axial fluid collection, hydrocephalus or mass lesion. The skull is unremarkable.  IMPRESSION: Unremarkable head CT.   Electronically Signed   By: Jenness Corner.D.  On: 11/09/2014 13:30    Anti-infectives: Anti-infectives    Start     Dose/Rate Route Frequency Ordered Stop   11/09/14 1200  vancomycin (VANCOCIN) IVPB 1000 mg/200 mL premix     1,000 mg 200 mL/hr over 60 Minutes Intravenous Every 12 hours 11/09/14 0457     11/09/14 0600  piperacillin-tazobactam (ZOSYN) IVPB  3.375 g     3.375 g 12.5 mL/hr over 240 Minutes Intravenous 3 times per day 11/09/14 0457     11/08/14 2230  piperacillin-tazobactam (ZOSYN) IVPB 3.375 g     3.375 g 100 mL/hr over 30 Minutes Intravenous  Once 11/08/14 2228 11/08/14 2351   11/08/14 2230  vancomycin (VANCOCIN) IVPB 1000 mg/200 mL premix     1,000 mg 200 mL/hr over 60 Minutes Intravenous  Once 11/08/14 2228 11/09/14 0102      Assessment/Plan:  1 - Bilateral Malignant Hydronephrosis - due to lymphoma. His GFR remains acceptable as does volume status and electrolytes. As  prognosis guarded and family desires transition to more palliative-mode, family and I feel observation is reasonable w/o further procedural intervention.   2 - Post Chemo Fever of Unknown Origin - Resolved after empiric ABX course. UA without bacteruria and UCX/BCX negative. ? Tumor lysis syndrome given volume of tumor and time course following chemo.  3 - Abdominal Pain - Not frank CVAT or any other localizing source on exam. Most likely cancer-related from progressive lymphoma and its treatment (tumor lysis).   4 - Will follow prn at this point. Call with questions anytime.   [facility], Caleb Francis 11/11/2014

## 2014-11-11 NOTE — Progress Notes (Signed)
Patient is sleeping soundly with family at bedside. No pain or agitation noted. Madlyn Frankel, RN

## 2014-11-11 NOTE — Progress Notes (Signed)
ANTIBIOTIC CONSULT NOTE - FOLLOW UP  Pharmacy Consult for Vancomycin and Zosyn Indication: rule out sepsis  No Known Allergies  Patient Measurements: Height: 6\' 1"  (185.4 cm) Weight: 171 lb 8 oz (77.792 kg) IBW/kg (Calculated) : 79.9 Adjusted Body Weight: 78.1 kg  Vital Signs: Temp: 98.2 F (36.8 C) (05/30 1305) Temp Source: Oral (05/30 1305) BP: 133/81 mmHg (05/30 1305) Pulse Rate: 100 (05/30 1305) Intake/Output from previous day: 05/29 0701 - 05/30 0700 In: 845 [I.V.:595; IV Piggyback:250] Out: 3900 [Urine:3900] Intake/Output from this shift:    Labs:  Recent Labs  11/08/14 2221 11/09/14 0559 11/10/14 1006 11/11/14 0435 11/11/14 0603  WBC 8.6 7.0  --   --  5.7  HGB 9.4* 8.4*  --   --  7.4*  PLT 133* 110*  --   --  35*  CREATININE 1.12 1.09 1.40* 1.59*  --    Estimated Creatinine Clearance: 49.6 mL/min (by C-G formula based on Cr of 1.59).  Recent Labs  11/11/14 1324  [place] 18     Microbiology: Recent Results (from the past 720 hour(s))  Blood Culture (routine x 2)     Status: None (Preliminary result)   Collection Time: 11/08/14 10:21 PM  Result Value Ref Range Status   Specimen Description BLOOD  Final   Special Requests NONE  Final   Culture NO GROWTH 3 DAYS  Final   Report Status PENDING  Incomplete  Blood Culture (routine x 2)     Status: None (Preliminary result)   Collection Time: 11/08/14 10:22 PM  Result Value Ref Range Status   Specimen Description BLOOD  Final   Special Requests NONE  Final   Culture NO GROWTH 3 DAYS  Final   Report Status PENDING  Incomplete  Urine culture     Status: None   Collection Time: 11/09/14  2:30 AM  Result Value Ref Range Status   Specimen Description URINE, RANDOM  Final   Special Requests NONE  Final   Culture NO GROWTH 1 DAY  Final   Report Status 11/10/2014 FINAL  Final    Anti-infectives    Start     Dose/Rate Route Frequency Ordered Stop   11/09/14 1200  vancomycin (VANCOCIN) IVPB 1000  mg/200 mL premix     1,000 mg 200 mL/hr over 60 Minutes Intravenous Every 12 hours 11/09/14 0457     11/09/14 0600  piperacillin-tazobactam (ZOSYN) IVPB 3.375 g     3.375 g 12.5 mL/hr over 240 Minutes Intravenous 3 times per day 11/09/14 0457     11/08/14 2230  piperacillin-tazobactam (ZOSYN) IVPB 3.375 g     3.375 g 100 mL/hr over 30 Minutes Intravenous  Once 11/08/14 2228 11/08/14 2351   11/08/14 2230  vancomycin (VANCOCIN) IVPB 1000 mg/200 mL premix     1,000 mg 200 mL/hr over 60 Minutes Intravenous  Once 11/08/14 2228 11/09/14 0102      Assessment: Patient with h/o of lymphoma admitted with abdominal pain. Consulted to dose Vancomycin and Zosyn. Per Oncology note: possible tumor lysis syndrome?  Ke=0.064, t1/2=10.8hr, Vd=57.5L  Goal of Therapy:  Vancomycin trough level 15-20 mcg/ml  Plan:  Measure antibiotic drug levels at steady state Follow up culture results Vancomycin 1g IV q12h. Trough level prior to 5th dose. Zosyn 3.375g IV q8h EI.  Trough 5/30 at 1330 = 18. Scr increasing - will need to follow closely, BMP already ordered for tomorrow AM, will order additional vancomycin trough prior to dose due tomorrow at 1400.  Skip Mayer,  PharmD  11/11/2014

## 2014-11-11 NOTE — Evaluation (Signed)
Clinical/Bedside Swallow Evaluation Patient Details  Name: Caleb Francis MRN: [id number] Date of Birth: July 20, 1945  Today's Date: 11/11/2014 Time: SLP Start Time (ACUTE ONLY): 41 SLP Stop Time (ACUTE ONLY): 1120 SLP Time Calculation (min) (ACUTE ONLY): 60 min  Past Medical History:  Past Medical History  Diagnosis Date  . Arthritis   . Hypertension   . RA (rheumatoid arthritis)   . Anemia   . Cancer     lymphoma  . History of nuclear stress test     a. 12/2013: low risk, no sig ischemia, no EKG changes, no artifact, EF 63%  . Chronic kidney disease    Past Surgical History:  Past Surgical History  Procedure Laterality Date  . Back surgery    . Fracture surgery      ankle  . Portacath placement    . Cystoscopy w/ ureteral stent placement Left 10/24/2014    Procedure: CYSTOSCOPY WITH RETROGRADE PYELOGRAM/URETERAL STENT PLACEMENT;  Surgeon: Irine Seal, MD;  Location: [facility] ORS;  Service: Urology;  Laterality: Left;   HPI:  mantle cell lymphoma; Ativan and Haldol given for agitation in the pat 48 hrs.    Assessment / Plan / Recommendation Clinical Impression  Pt appears to present w/ adequate oropharngeal swallow abilities to tolerate purees and liquids. Pt presented w/ no overt s/s of aspiration and no decline in respiratory status during/post trials given. Clear vocal quality noted x3/3 assessments. During the oral phase, pt presented w/ min. decreased overall awareness for limiting bolus volume and attempted to take larger sips. SLP limitied bolus volume by pinching the straw when drinking. Pt demo. adequate bolus control for transfer/swallow/clearing. No oral motor weakness noted during oral motor movements during bolus management. Pt appears able to tolerate a puree diet w/ thin liquids w/ general aspiration precautions; meds in Puree w/ NSG. Pt appears at min. increased risk for aspiration sec. to his Cognitive status and need for medications for agitiation.     Aspiration  Risk  Mild (sec. to cognitive status; meds for agitation; drowsiness)    Diet Recommendation Dysphagia 1 (Puree);Thin   Medication Administration: Whole meds with puree Compensations: Slow rate;Small sips/bites (Pinch straw to limit bolus amount )    Other  Recommendations Oral Care Recommendations: Oral care BID;Oral care before and after PO;Staff/trained caregiver to provide oral care   Follow Up Recommendations       Frequency and Duration min 3x week  1 week   Pertinent Vitals/Pain Pt indicated pain; NSG addressed     SLP Swallow Goals   see care plan   Swallow Study Prior Functional Status   resided at home w/ family    General Date of Onset: 11/08/14 Other Pertinent Information: mantle cell lymphoma; Ativan and Haldol given for agitation in the pat 48 hrs.  Type of Study: Bedside swallow evaluation Previous Swallow Assessment: none indicated Diet Prior to this Study: Regular;Thin liquids Temperature Spikes Noted: No Respiratory Status: Room air History of Recent Intubation: No Behavior/Cognition: Cooperative;Confused;Agitated;Requires cueing Oral Cavity - Dentition: Adequate natural dentition/normal for age Self-Feeding Abilities: Needs assist Patient Positioning: Upright in bed Baseline Vocal Quality: Normal Volitional Cough: Strong Volitional Swallow: Able to elicit    Oral/Motor/Sensory Function Overall Oral Motor/Sensory Function: Other (comment) (unable to assess sec. to pt's declined Cognitive status)   Ice Chips Ice chips: Within functional limits (3 trials) Presentation: Spoon (fed by SLP)   Thin Liquid Thin Liquid: Impaired Presentation: Spoon;Straw (3 trials by spoon; 5 trials via straw assisted by SLP)  Oral Phase Impairments:  (Impulsive drinking noted; req'd SLP to pinch straw to limit ) Pharyngeal  Phase Impairments:  (none noted)    Nectar Thick Nectar Thick Liquid: Not tested   Honey Thick Honey Thick Liquid: Not tested   Puree Puree: Within  functional limits Presentation: Spoon (x8 trials fed by SLP)   Solid   GO    Solid: Not tested Other Comments:  (sec. to pt's Cognitive status)       Caleb Francis 11/11/2014,11:32 AM

## 2014-11-11 NOTE — Progress Notes (Signed)
Dr. Jannifer Franklin notified patient is trying to climb out of bed, agitated. Ativan given around 2220, no improvement. MD to put in orders.

## 2014-11-11 NOTE — Consult Note (Signed)
[place]  [phone]) [phone]  Fax:(336) [place]: 05-16-1946  MR#: [id number]  VQM#:[id number]  Patient Care Team: No Pcp Per Patient as PCP - General (General Practice)  CHIEF COMPLAINT:  Chief Complaint  Patient presents with  . Extremity Pain    INTERVAL HISTORY: Caleb Francis is a 69 y.o. male with progressive mantle cell lymphoma who was admitted with intractable pain. Also with AMS today, hallucinations, delirium. His nurse reports that his pain is better controlled now but this morning he was in severe distress. He has recently had a CT scan of head to evaluate AMS. [place] this morning was not completed due to his severe pain. He is lying in bed currently sedated, received lorazepam due to agitation. Is on IV hydromorphone for pain PRN.  REVIEW OF SYSTEMS:   Review of Systems  Unable to perform ROS Gastrointestinal: Positive for abdominal pain.  Neurological: Positive for weakness.  Psychiatric/Behavioral: The patient is nervous/anxious.     As per HPI. Otherwise, a complete review of systems is negative.  ONCOLOGY HISTORY:  No history exists.    PAST MEDICAL HISTORY: Past Medical History  Diagnosis Date  . Arthritis   . Hypertension   . RA (rheumatoid arthritis)   . Anemia   . Cancer     lymphoma  . History of nuclear stress test     a. 12/2013: low risk, no sig ischemia, no EKG changes, no artifact, EF 63%  . Chronic kidney disease     PAST SURGICAL HISTORY: Past Surgical History  Procedure Laterality Date  . Back surgery    . Fracture surgery      ankle  . Portacath placement    . Cystoscopy w/ ureteral stent placement Left 10/24/2014    Procedure: CYSTOSCOPY WITH RETROGRADE PYELOGRAM/URETERAL STENT PLACEMENT;  Surgeon: Irine Seal, MD;  Location: [facility] ORS;  Service: Urology;  Laterality: Left;    FAMILY HISTORY Family History  Problem Relation Age of Onset  . Cancer Mother     breast  . Cancer  Father     bone cancer    GYNECOLOGIC HISTORY:  No LMP for male patient.     ADVANCED DIRECTIVES:    HEALTH MAINTENANCE: History  Substance Use Topics  . Smoking status: Former Smoker -- 0.50 packs/day for 60 years    Types: Cigarettes    Quit date: 10/18/2014  . Smokeless tobacco: Not on file  . Alcohol Use: No     Colonoscopy:  PAP:  Bone density:  Lipid panel:  No Known Allergies  Current Facility-Administered Medications  Medication Dose Route Frequency Provider Last Rate Last Dose  . 0.9 %  sodium chloride infusion   Intravenous Continuous Gladstone Lighter, MD 75 mL/hr at 11/11/14 1019    . acetaminophen (TYLENOL) suppository 650 mg  650 mg Rectal Q6H PRN Gladstone Lighter, MD   650 mg at 11/09/14 1008  . allopurinol (ZYLOPRIM) tablet 300 mg  300 mg Oral Daily Gladstone Lighter, MD   300 mg at 11/11/14 1019  . antiseptic oral rinse (CPC / CETYLPYRIDINIUM CHLORIDE 0.05%) solution 7 mL  7 mL Mouth Rinse q12n4p Gladstone Lighter, MD   7 mL at 11/09/14 1200  . chlorhexidine (PERIDEX) 0.12 % solution 15 mL  15 mL Mouth Rinse BID Gladstone Lighter, MD   15 mL at 11/10/14 2128  . haloperidol lactate (HALDOL) injection 2 mg  2 mg Intravenous Q6H PRN Lance Coon, MD   2 mg  at 11/11/14 0227  . HYDROmorphone (DILAUDID) injection 0.5-1 mg  0.5-1 mg Intravenous Q4H PRN Gladstone Lighter, MD   1 mg at 11/11/14 1054  . ipratropium (ATROVENT) nebulizer solution 0.5 mg  0.5 mg Nebulization Q6H PRN Gladstone Lighter, MD      . LORazepam (ATIVAN) injection 1 mg  1 mg Intravenous Q6H PRN Gladstone Lighter, MD   1 mg at 11/11/14 1047  . metoprolol (LOPRESSOR) injection 2.5 mg  2.5 mg Intravenous QID PRN Gladstone Lighter, MD      . ondansetron (ZOFRAN) injection 4 mg  4 mg Intravenous Q6H PRN Juluis Mire, MD      . piperacillin-tazobactam (ZOSYN) IVPB 3.375 g  3.375 g Intravenous 3 times per day Vira Blanco, RPH   3.375 g at 11/11/14 0600  . polyethylene glycol (MIRALAX / GLYCOLAX)  packet 17 g  17 g Oral Daily PRN Gladstone Lighter, MD      . senna-docusate (Senokot-S) tablet 2 tablet  2 tablet Oral BID Gladstone Lighter, MD   2 tablet at 11/11/14 1019  . vancomycin (VANCOCIN) IVPB 1000 mg/200 mL premix  1,000 mg Intravenous Q12H Vira Blanco, RPH   1,000 mg at 11/11/14 0200   Facility-Administered Medications Ordered in Other Encounters  Medication Dose Route Frequency Provider Last Rate Last Dose  . 0.9 %  sodium chloride infusion   Intravenous Continuous Lequita Asal, MD 20 mL/hr at 11/06/14 1010 20 mL/hr at 11/06/14 1010  . morphine 2 MG/ML injection 2 mg  2 mg Intravenous Q2H PRN Lequita Asal, MD   2 mg at 11/05/14 1422  . sodium chloride 0.9 % injection 10 mL  10 mL Intracatheter PRN Lequita Asal, MD   10 mL at 11/06/14 1011    OBJECTIVE: BP 138/81 mmHg  Pulse 124  Temp(Src) 97.8 F (36.6 C) (Oral)  Resp 22  Ht 6\' 1"  (1.854 m)  Wt 171 lb 8 oz (77.792 kg)  BMI 22.63 kg/m2  SpO2 100%   Body mass index is 22.63 kg/(m^2).    ECOG FS:4 - Bedbound  General: Patient is much more responsive today. Answering questions appropriately. Denies any pain. HEENT: Normocephalic, moist mucous membranes, clear oropharynx. Lungs: Coarse breath sounds in upper lobes. Currently receiving SVN treatment. Heart: Tachycardic. Abdomen: Soft, nontender. Musculoskeletal: No edema, cyanosis, or clubbing. Neuro: Cranial nerves grossly intact. Currently sedated.  Skin: No rashes or petechiae noted. Lymphatics: Diffuse lymphadenopathy.  LAB RESULTS:     Component Value Date/Time   NA 145 11/11/2014 0435   NA 141 07/17/2014 1121   K 3.0* 11/11/2014 0435   K 2.8* 07/17/2014 1121   CL 116* 11/11/2014 0435   CL 100 07/17/2014 1121   CO2 20* 11/11/2014 0435   CO2 32 07/17/2014 1121   GLUCOSE 134* 11/11/2014 0435   GLUCOSE 105* 07/17/2014 1121   BUN 44* 11/11/2014 0435   BUN 9 07/17/2014 1121   CREATININE 1.59* 11/11/2014 0435   CREATININE 1.09 07/17/2014  1121   CALCIUM 8.1* 11/11/2014 0435   CALCIUM 8.9 07/17/2014 1121   PROT 6.6 11/08/2014 2221   PROT 7.3 07/17/2014 1121   ALBUMIN 2.6* 11/08/2014 2221   ALBUMIN 3.1* 07/17/2014 1121   AST 38 11/08/2014 2221   AST 17 07/17/2014 1121   ALT 29 11/08/2014 2221   ALT 12* 07/17/2014 1121   ALKPHOS 194* 11/08/2014 2221   ALKPHOS 79 07/17/2014 1121   BILITOT 1.0 11/08/2014 2221   GFRNONAA 43* 11/11/2014 0435   GFRNONAA >  60 02/11/2014 0851   GFRAA 50* 11/11/2014 0435   GFRAA >60 02/11/2014 0851    No results found for: SPEP, UPEP  Lab Results  Component Value Date   WBC 5.7 11/11/2014   NEUTROABS 7.8* 11/08/2014   HGB 7.4* 11/11/2014   HCT 21.9* 11/11/2014   MCV 81.6 11/11/2014   PLT 35* 11/11/2014    @LASTCHEMISTRY @  No results found for: LABCA2  No components found for: LABCA125   Recent Labs Lab 11/08/14 2221  INR 1.17       Component Value Date/Time   COLORURINE YELLOW* 11/09/2014 0230   APPEARANCEUR CLEAR* 11/09/2014 0230   LABSPEC 1.016 11/09/2014 0230   PHURINE 5.0 11/09/2014 0230   GLUCOSEU 50* 11/09/2014 0230   HGBUR 2+* 11/09/2014 0230   BILIRUBINUR NEGATIVE 11/09/2014 0230   KETONESUR TRACE* 11/09/2014 0230   PROTEINUR 30* 11/09/2014 0230   NITRITE NEGATIVE 11/09/2014 0230   LEUKOCYTESUR NEGATIVE 11/09/2014 0230    STUDIES: Ct Head Wo Contrast  11/09/2014   CLINICAL DATA:  Degenerating mental status, sepsis and history of progressive mantle cell lymphoma.  EXAM: CT HEAD WITHOUT CONTRAST  TECHNIQUE: Contiguous axial images were obtained from the base of the skull through the vertex without intravenous contrast.  COMPARISON:  05/21/2009  FINDINGS: No findings by unenhanced head CT to suggest brain involvement by lymphoma. The brain demonstrates no evidence of hemorrhage, infarction, edema, mass effect, extra-axial fluid collection, hydrocephalus or mass lesion. The skull is unremarkable.  IMPRESSION: Unremarkable head CT.   Electronically Signed   By:  Aletta Edouard M.D.   On: 11/09/2014 13:30   Ct Angio Chest Pe W/cm &/or Wo Cm  10/30/2014   CLINICAL DATA:  Tachycardia.  History of mantle cell lymphoma.  EXAM: CT ANGIOGRAPHY CHEST WITH CONTRAST  TECHNIQUE: Multidetector CT imaging of the chest was performed using the standard protocol during bolus administration of intravenous contrast. Multiplanar CT image reconstructions and MIPs were obtained to evaluate the vascular anatomy.  CONTRAST:  58mL OMNIPAQUE IOHEXOL 350 MG/ML SOLN  COMPARISON:  Head CT 09/16/2014 and chest CT 11/12/2013  FINDINGS: No evidence for pulmonary embolism.  There is a right jugular Port-A-Cath with the tip in the SVC. There are coronary artery calcifications. No significant pericardial or pleural fluid.  Compared to the prior PET-CT, there has been marked enlargement of the left axillary lymphadenopathy. Index lymph node in the left axilla measures 3.0 cm in the short axis on sequence 4, image 44 and previously measured 1.4 cm. There are also enlarged lymph nodes throughout the left sub pectoralis region. Second index lymph node in the left axilla measures 3.2 cm on sequence 4, image 30 and previously measured 1.7 cm in the short axis. Again noted are prominent lymph nodes in the right axilla but these have minimally changed compared to the left side. Small mediastinal lymph nodes have minimally changed. Again noted is adenopathy in the upper abdomen which is incompletely imaged.  The trachea and mainstem bronchi are patent. There are new or increased patchy densities at both lung bases. Some of this could represent atelectasis but cannot exclude an infectious or inflammatory process, particularly in the right lower lobe. Again noted are subtle nodular densities in the right middle lobe on sequence 6, image 104. Again noted is a small irregular nodule in the right middle lobe on sequence 6, image 91 measuring roughly 6 mm. Mild paraseptal emphysema at the lung apices with mild apical  scarring. Again noted are small pleural-based nodular densities  in left upper lung, best seen on sequence 6, image 24.  No acute bone abnormality. Again noted is a mild compression deformity involving the T9 vertebral body but this is unchanged from 05/24/2014.  Review of the MIP images confirms the above findings.  IMPRESSION: Negative for pulmonary embolism.  Patchy parenchymal densities in the lower lobes, right side greater the left. Findings could be associated with atelectasis but there may be an infectious or inflammatory process, particularly in the right lower lobe.  Interval enlargement of the lymphadenopathy in the left axilla and left sub pectoralis region. Findings are compatible with progression of the lymphoma.  Few small pulmonary nodules, particularly in the right middle lobe. Recommend attention to these nodules on follow up imaging.   Electronically Signed   By: Markus Daft M.D.   On: 10/30/2014 16:30   Ct Abdomen Pelvis W Contrast  11/09/2014   CLINICAL DATA:  Diffuse abdominal pain.  Chemotherapy yesterday.  EXAM: CT ABDOMEN AND PELVIS WITH CONTRAST  TECHNIQUE: Multidetector CT imaging of the abdomen and pelvis was performed using the standard protocol following bolus administration of intravenous contrast.  CONTRAST:  136mL OMNIPAQUE IOHEXOL 300 MG/ML  SOLN  COMPARISON:  10/23/2014  FINDINGS: BODY WALL: Partly visible left lower axillary lymphadenopathy, recently evaluated by chest CT 10/30/2014.  There is bulky bilateral inguinal lymphadenopathy which has increased from 10/23/2014, with left inguinal node on image 88 measuring 31 mm short axis, previously 23 mm  LOWER CHEST: Reticular opacities in the lower lungs, greater on the right, is favored atelectasis. No definitive pneumonia. Lower thoracic lymphadenopathy has increased, with a left retrocrural lymph node currently 15 mm on image 23, previously 10 mm.  ABDOMEN/PELVIS:  Liver: No focal abnormality.  Biliary: Cholelithiasis. The  gallbladder is distended but there is no inflammatory wall thickening.  Pancreas: Unremarkable.  Spleen: Mild splenomegaly, 15 cm in craniocaudal span. This is increased by approximately 4 cm since previous.  Adrenals: Negative.  Kidneys and ureters: Moderate bilateral hydroureteronephrosis to the level of the pelvis. This is despite an internal ureteral stent on the left which is well positioned. Symmetric renal enhancement.  Bladder: Thickening of the bladder wall, preferentially to the left, stable from previous. This is indeterminate and will be assessed on followup imaging.  Reproductive: No pathologic findings.  Bowel: No obstruction. No inflammatory bowel wall thickening  Retroperitoneum: There is bulky and diffuse retroperitoneal lymphadenopathy. There has been significant and rapid increased from previous. Index left periaortic lymph node on image 39 measures 88 x 69 mm (previously 59 x 35 mm) on image 39). Left pelvic sidewall lymph node that was previously measured is on image 71 and stable at 58 x 41 mm. There is new enlargement of upper abdominal retroperitoneal lymph nodes . Some cystic change, presumably treatment related, present in the largest nodes, especially in the left pelvis.  Vascular: Mass effect on the left iliac veins by lymphadenopathy, with progressive subcutaneous edema in the left upper thigh.  OSSEOUS: No acute abnormalities.  IMPRESSION: 1. Rapid progression of lymphoma with marked enlargement of lymph nodes compared to 10/23/2014. 2. Bilateral hydroureteronephrosis, new on the right and unchanged on the left (despite a well-positioned left internal ureteral stent). 3. Progressive edema in the upper left thigh. Given severe compression of the left iliac vein by lymphadenopathy, consider Doppler to evaluate for DVT.   Electronically Signed   By: Monte Fantasia M.D.   On: 11/09/2014 02:13   Ct Abdomen Pelvis W Contrast  10/23/2014  CLINICAL DATA:  Swelling to the left upper thigh  since last night. Left lower abdominal pain. History of lymphoma with chemotherapy 2015.  EXAM: CT ABDOMEN AND PELVIS WITH CONTRAST  TECHNIQUE: Multidetector CT imaging of the abdomen and pelvis was performed using the standard protocol following bolus administration of intravenous contrast.  CONTRAST:  18mL OMNIPAQUE IOHEXOL 300 MG/ML SOLN, 19mL OMNIPAQUE IOHEXOL 240 MG/ML SOLN  COMPARISON:  PET-CT scan 09/16/2014  FINDINGS: Dependent atelectasis in the lung bases.  Cholelithiasis with multiple stones in the gallbladder. No gallbladder wall thickening or infiltration. Multiple accessory spleens. Liver, spleen, pancreas, adrenal glands, and inferior vena cava are unremarkable. Calcification of the abdominal aorta without aneurysm. There is fairly prominent retroperitoneal and periaortic lymphadenopathy extending all the way down into the pelvis with diffuse bilateral iliac chain lymphadenopathy, greater on the left. Left periaortic lymph nodes measure up to 5.9 by 3.5 cm diameter. Left pelvic lymph nodes measure up to 5 by 4.2 cm diameter. Scattered lymph nodes in the celiac axis are upper limits of normal in size. Lymphadenopathy demonstrated in the left groin. Diffuse soft tissue edema in the left upper leg without discrete fluid collection. There is hydronephrosis and hydroureter on the left probably due to extrinsic compression from left pelvic lymphadenopathy. Lymph nodes in all areas demonstrate enlargement since the previous PET-CT scan. Findings suggest progression of lymphoma.  Stomach, small bowel, and colon are not abnormally distended. No free air or free fluid in the abdomen. Abdominal wall musculature appears intact.  Pelvis: Prostate gland is not enlarged. Bladder wall is mildly thickened, possibly indicating infection. Appendix is normal. No evidence of diverticulitis. Degenerative changes of the spine. No destructive bone lesions.  IMPRESSION: Significant interval enlargement of diffuse  retroperitoneal, bilateral pelvic, and left groin lymph nodes. Lymphadenopathy is most prominent on the left. Diffuse edema demonstrated in the left upper leg. Left renal hydronephrosis and hydroureter probably due to extrinsic compression by lymphadenopathy. Findings suggest progression of lymphoma.   Electronically Signed   By: Lucienne Capers M.D.   On: 10/23/2014 01:29   US Venous Img Lower Unilateral Left  10/22/2014   CLINICAL DATA:  Left leg swelling for 2 days. Patient is on aspirin.  EXAM: Left LOWER EXTREMITY VENOUS DOPPLER ULTRASOUND  TECHNIQUE: Gray-scale sonography with graded compression, as well as color Doppler and duplex ultrasound were performed to evaluate the lower extremity deep venous systems from the level of the common femoral vein and including the common femoral, femoral, profunda femoral, popliteal and calf veins including the posterior tibial, peroneal and gastrocnemius veins when visible. The superficial great saphenous vein was also interrogated. Spectral Doppler was utilized to evaluate flow at rest and with distal augmentation maneuvers in the common femoral, femoral and popliteal veins.  COMPARISON:  None.  FINDINGS: Contralateral Common Femoral Vein: Respiratory phasicity is normal and symmetric with the symptomatic side. No evidence of thrombus. Normal compressibility.  Common Femoral Vein: No evidence of thrombus. Normal compressibility, respiratory phasicity and response to augmentation.  Saphenofemoral Junction: No evidence of thrombus. Normal compressibility and flow on color Doppler imaging.  Profunda Femoral Vein: No evidence of thrombus. Normal compressibility and flow on color Doppler imaging.  Femoral Vein: No evidence of thrombus. Normal compressibility, respiratory phasicity and response to augmentation.  Popliteal Vein: No evidence of thrombus. Normal compressibility, respiratory phasicity and response to augmentation.  Calf Veins: Peroneal vein is not visualized.  Visualized posterior tibial vein demonstrates no evidence of thrombus. Normal compressibility and flow on color Doppler imaging.  Superficial Great Saphenous Vein: No evidence of thrombus. Normal compressibility and flow on color Doppler imaging.  Venous Reflux:  None.  Other Findings: Incidental note of prominent lymph nodes in the left groin area. Central flow and fatty hilum are demonstrated. This is likely reactive.  IMPRESSION: No evidence of deep venous thrombosis.   Electronically Signed   By: Lucienne Capers M.D.   On: 10/22/2014 22:45   [place] Extrem Low Left Comp  11/09/2014   CLINICAL DATA:  Left calf swelling and lower extremity pain. History of lymphoma.  EXAM: Left LOWER EXTREMITY VENOUS DOPPLER ULTRASOUND  TECHNIQUE: Gray-scale sonography with graded compression, as well as color Doppler and duplex ultrasound were performed to evaluate the lower extremity deep venous systems from the level of the common femoral vein and including the common femoral, femoral, profunda femoral, popliteal and calf veins including the posterior tibial, peroneal and gastrocnemius veins when visible. The superficial great saphenous vein was also interrogated. Spectral Doppler was utilized to evaluate flow at rest and with distal augmentation maneuvers in the common femoral, femoral and popliteal veins.  COMPARISON:  None.  FINDINGS: Contralateral Common Femoral Vein: Respiratory phasicity is normal and symmetric with the symptomatic side. No evidence of thrombus. Normal compressibility.  Common Femoral Vein: No evidence of thrombus. Normal compressibility, respiratory phasicity and response to augmentation.  Saphenofemoral Junction - Superficial Great Saphenous Vein: Due to patient pain, the patient requested that the examination be terminated therefore the superficial femoral vein through popliteal trifurcation could not be evaluated.  IMPRESSION: No evidence of deep venous thrombosis involving the upper common femoral  vein. The patient requested that the examination be terminated and did not allow the sonographer to examine the lower extremity venous system from the level of the saphenous femoral junction through the popliteal trifurcation.   Electronically Signed   By: Conchita Paris M.D.   On: 11/09/2014 09:40   Dg Chest Port 1 View  11/08/2014   CLINICAL DATA:  Acute onset of sepsis.  Initial encounter.  EXAM: PORTABLE CHEST - 1 VIEW  COMPARISON:  Chest radiograph performed 11/12/2013, and CTA of the chest performed 10/30/2014  FINDINGS: The lungs are well-aerated. Mild vascular congestion is noted, with mild right basilar atelectasis. There is no evidence of pleural effusion or pneumothorax.  The cardiomediastinal silhouette is within normal limits. No acute osseous abnormalities are seen. A right-sided chest port is noted ending about the mid SVC.  IMPRESSION: Mild vascular congestion, with mild right basilar atelectasis. Lungs otherwise clear.   Electronically Signed   By: Garald Balding M.D.   On: 11/08/2014 23:06   Dg Abd 2 Views  10/25/2014   CLINICAL DATA:  Ureteral stent  EXAM: ABDOMEN - 2 VIEW  COMPARISON:  CT 10/23/2014  FINDINGS: Double-J left ureteral stent extends from the left renal pelvis to the bladder in expected location. Oral contrast from recent CT noted within the colon. Mild bibasilar atelectasis.  IMPRESSION: Left ureteral stent in expected location.   Electronically Signed   By: Suzy Bouchard M.D.   On: 10/25/2014 13:42    ASSESSMENT:  Caleb Francis is a 69 y.o. male with progressive mantle cell lymphoma admitted with intractable abdominal pain, AMS, Sepsis and fever.  PLAN:  1. Hematology/Oncology: enlarging adenopathy on imaging.Completed first dose of RICE chemotherapy Friday with pump removal in CCU after admission to hospital.On allopurinol for tumor lysis. Abdominal pain had continued to worsen with rapidly increasing disease. He appears to be rather comfortable now. He  is much more alert and denies any complaints at present time.  2.  Hydronephrosis: Now with bilateral hydronephrosis. Currently has a stent in place on right. Urology has evaluated.  3. Pain/Toxicology: Pain appears controlled at present time but has recently received ativan as well. Currently on Hydromorphone IV.  4.  Sepsis: Continue with current antibiotic coverage with vancomycin and zosyn. If there is no further improvement, comfort care will be a consideration. Palliative care has been consulted.  Oncology will continue to follow.   Dr. Grayland Ormond was available for consultation and review of plan of care for this patient.  Evlyn Kanner, NP   11/11/2014 11:47 AM

## 2014-11-11 NOTE — Plan of Care (Signed)
Problem: Discharge Progression Outcomes Goal: Discharge plan in place and appropriate Individualization of Care:   PMH: Arthritis, HTN, RA, Anemia, CKD, and Lymphoma (Diagnosed Sept. 2014).   High Fall Risk - safety checks with hourly rounding.          Goal: Other Discharge Outcomes/Goals Plan of care progress to goal:  NS at 75 ml/hr infusing. Diet advanced to DYS 1. Ativan/Dilaudid PRN ordered if needed. Ativan and Dilaudid given once, relief noted. Foley in place. Family at bedside. Patient/family support provided. Palliative care consult placed.   Patient repositioned. Multiple requests to be placed on bedpan by patient. LBM unknown. Dr. Tressia Miners implemented stool softners/laxative.

## 2014-11-11 NOTE — Progress Notes (Signed)
Dr. Reece Levy notified about Platelet count at 35 this morning, on 5/28 it was 110. Discontinue heparin SQ, follow up with rounding doctors.

## 2014-11-11 NOTE — Progress Notes (Signed)
[place] at [place] NAME: Caleb Francis    MR#:  [id number]  DATE OF BIRTH:  February 23, 1946  SUBJECTIVE:  CHIEF COMPLAINT:   Chief Complaint  Patient presents with  . Extremity Pain  - continues to be confused. Complaints of pain all over again. -Family at bedside. Not significant improvement since yesterday -Breathing is very labored, only comfortable with pain medication. -Patient has received several doses of Ativan and Haldol last night for agitation and trying to get out of bed.  REVIEW OF SYSTEMS:  ROSConfused at this time Just complaining of pain all over.  DRUG ALLERGIES:  No Known Allergies  VITALS:  Blood pressure 138/81, pulse 124, temperature 97.8 F (36.6 C), temperature source Oral, resp. rate 22, height 6\' 1"  (1.854 m), weight 77.792 kg (171 lb 8 oz), SpO2 100 %.  PHYSICAL EXAMINATION:  Physical Exam  GENERAL:  69 y.o.-year-old patient lying in the bed, distress secondary to pain all over. Confused, ill appearing  EYES: Pupils equal, round, reactive to light and accommodation. No scleral icterus. Extraocular muscles intact.  HEENT: Head atraumatic, normocephalic. Oropharynx and nasopharynx clear. Extremely dry mucous membranes. NECK:  Supple, no jugular venous distention. No thyroid enlargement, no tenderness.  LUNGS: Normal breath sounds bilaterally, no wheezing, rales,rhonchi or crepitation. No use of accessory muscles of respiration.  CARDIOVASCULAR: S1, S2 normal. No murmurs, rubs, or gallops.  ABDOMEN: Distended, generalized tenderness, or worse in the left flank and left lateral abdominal region.  Bowel sounds present. No organomegaly or mass.  EXTREMITIES: Left leg 2+ edema, right leg no edema NEUROLOGIC: Cranial nerves II through XII are intact. Generalized weakness present. No focal weakness noted. Sensation is intact. Gait not checked.  Not following commands PSYCHIATRIC: The patient is alert and but  disoriented likely from pain medication. Arousable and answers some simple questions.  SKIN: No obvious rash, lesion, or ulcer.    LABORATORY PANEL:   CBC  Recent Labs Lab 11/11/14 0603  WBC 5.7  HGB 7.4*  HCT 21.9*  PLT 35*   ------------------------------------------------------------------------------------------------------------------  Chemistries   Recent Labs Lab 11/08/14 2221 11/08/14 2247  11/11/14 0435  NA 141  --   < > 145  K 5.0  --   < > 3.0*  CL 107  --   < > 116*  CO2 23  --   < > 20*  GLUCOSE 76  --   < > 134*  BUN 22*  --   < > 44*  CREATININE 1.12  --   < > 1.59*  CALCIUM 9.1  --   < > 8.1*  MG  --  2.2  --   --   AST 38  --   --   --   ALT 29  --   --   --   ALKPHOS 194*  --   --   --   BILITOT 1.0  --   --   --   < > = values in this interval not displayed. ------------------------------------------------------------------------------------------------------------------  Cardiac Enzymes  Recent Labs Lab 11/08/14 2221  TROPONINI 0.07*   ------------------------------------------------------------------------------------------------------------------  RADIOLOGY:  Ct Head Wo Contrast  11/09/2014   CLINICAL DATA:  Degenerating mental status, sepsis and history of progressive mantle cell lymphoma.  EXAM: CT HEAD WITHOUT CONTRAST  TECHNIQUE: Contiguous axial images were obtained from the base of the skull through the vertex without intravenous contrast.  COMPARISON:  05/21/2009  FINDINGS: No findings by unenhanced head CT  to suggest brain involvement by lymphoma. The brain demonstrates no evidence of hemorrhage, infarction, edema, mass effect, extra-axial fluid collection, hydrocephalus or mass lesion. The skull is unremarkable.  IMPRESSION: Unremarkable head CT.   Electronically Signed   By: Aletta Edouard M.D.   On: 11/09/2014 13:30    EKG:   Orders placed or performed during the hospital encounter of 11/08/14  . EKG 12-Lead  . EKG 12-Lead     ASSESSMENT AND PLAN:   69 year old African-American male with past medical history significant for mantle cell lymphoma with recurrence, started on chemotherapy 3 days ago, recent admission for left-sided hydronephrosis status post ureteral stent placement last week, comes to the hospital secondary to worsening of abdominal pain and fevers.  #1 sepsis-Likely intraabdominal. Blood cultures are negative at this time..  - Continue IV vancomycin and Zosyn at this time. - Urology has been consulted. Blood pressure is stable. Has not required any pressors.  #2 acute renal failure-secondary to obstructive uropathy, bilateral hydronephrosis. Secondary to worsening retroperitoneal lymphadenopathy. Appreciate urology consult. - Patient already had left-sided stent placement last week. -Does not clinically appear stable enough to get the right-sided stent yet. -Continue to monitor renal function, IV fluids at this time.  #3 thrombocytopenia-likely from recent chemotherapy. Platelet counts dropped to 35K. No indication for transfusion and was less than 20 K. No active bleeding at this time. Subcutaneous heparin has been stopped.  #4 mantle cell lymphoma-diagnosed in September 2014. Now CT with progression of the lymphadenopathy inspite of new chemotherapy. Oncology has been consulted. poor prognosis per oncology Discussed extensively with daughter and son at bedside- they want to do IV ABX for 1 more day- but focus more on patients comfort if needed. If no improvement- plan to change to comfort care and hospice involvement - Patient is DNR - Transfer to oncology floor  #5 anemia-anemia of chronic disease. Likely acute from recent chemotherapy. Monitor. No acute indication for transfusion at this time. Transfuse if hemoglobin is less than 7  #6 . Gout-restarted allopurinol especially with his recent chemotherapy, to help with tumor lysis syndrome.  #7 DVT prophylaxis- subcutaneous heparin.  #6  hypertension-hold all blood pressure medications, low normal blood pressure due to sepsis. Use metoprolol IV as needed for his tachycardia.  Overall prognosis is poor. No significant improvement noted in the last 48 hours. Palliative care has been consulted. Already started discussion with family about hospice. Daughter and son at bedside and agree it to be appropriate.   All the records are reviewed and case discussed with Care Management/Social Workerr. Management plans discussed with the patient, family and they are in agreement.  CODE STATUS: DNR  TOTAL TIME SPENT IN TAKING CARE OF THIS PATIENT: 42 minutes.   POSSIBLE D/C IN 3 DAYS, DEPENDING ON CLINICAL CONDITION.   George Alcantar M.D on 11/11/2014 at 9:50 AM  Between 7am to 6pm - Pager - [phone]  After 6pm go to [email or web address] - password [email or web address] [facility] [place]  [place] [facility]  Office  [phone]  CC: Primary care physician; No PCP Per Patient

## 2014-11-11 NOTE — Plan of Care (Signed)
Problem: Discharge Progression Outcomes Goal: Discharge plan in place and appropriate Outcome: Progressing Individualization of Care:   PMH: Arthritis, HTN, RA, Anemia, CKD, and Lymphoma (Diagnosed Sept. 2014).   High Fall Risk - safety checks with hourly rounding Goal: Other Discharge Outcomes/Goals Outcome: Progressing Patient is confused, alert to self. Patient remains anxious and attempts to get out of bed. Ativan and Haldol given with very little improvement. No complaints of pain. Family at bedside.

## 2014-11-11 NOTE — Progress Notes (Signed)
Dr. Reece Levy notified patient not improving with Haldol dose. Ordered one time order of 2 mg Ativan IV push.

## 2014-11-11 NOTE — Plan of Care (Signed)
Problem: SLP Dysphagia Goals Goal: Misc Dysphagia Goal Pt will safely tolerate po diet of least restrictive consistency w/ no overt s/s of aspiration noted by Staff/pt/family x3 sessions.    

## 2014-11-12 ENCOUNTER — Ambulatory Visit: Payer: Medicare Other | Admitting: Hematology and Oncology

## 2014-11-12 DIAGNOSIS — R109 Unspecified abdominal pain: Secondary | ICD-10-CM

## 2014-11-12 DIAGNOSIS — I10 Essential (primary) hypertension: Secondary | ICD-10-CM

## 2014-11-12 DIAGNOSIS — M069 Rheumatoid arthritis, unspecified: Secondary | ICD-10-CM

## 2014-11-12 DIAGNOSIS — Z515 Encounter for palliative care: Secondary | ICD-10-CM

## 2014-11-12 LAB — CBC
HCT: 20.2 % — ABNORMAL LOW (ref 40.0–52.0)
Hemoglobin: 6.8 g/dL — ABNORMAL LOW (ref 13.0–18.0)
MCH: 27.4 pg (ref 26.0–34.0)
MCHC: 33.4 g/dL (ref 32.0–36.0)
MCV: 82.1 fL (ref 80.0–100.0)
Platelets: 17 10*3/uL — CL (ref 150–440)
RBC: 2.46 MIL/uL — ABNORMAL LOW (ref 4.40–5.90)
RDW: 20.3 % — AB (ref 11.5–14.5)
WBC: 3.6 10*3/uL — AB (ref 3.8–10.6)

## 2014-11-12 LAB — ABO/RH: ABO/RH(D): O POS

## 2014-11-12 LAB — BASIC METABOLIC PANEL
Anion gap: 10 (ref 5–15)
BUN: 40 mg/dL — AB (ref 6–20)
CHLORIDE: 110 mmol/L (ref 101–111)
CO2: 21 mmol/L — AB (ref 22–32)
Calcium: 8.2 mg/dL — ABNORMAL LOW (ref 8.9–10.3)
Creatinine, Ser: 1.44 mg/dL — ABNORMAL HIGH (ref 0.61–1.24)
GFR calc Af Amer: 57 mL/min — ABNORMAL LOW (ref >60)
GFR calc non Af Amer: 49 mL/min — ABNORMAL LOW (ref >60)
Glucose, Bld: 118 mg/dL — ABNORMAL HIGH (ref 65–99)
Potassium: 2.5 mmol/L — CL (ref 3.5–5.1)
Sodium: 141 mmol/L (ref 135–145)

## 2014-11-12 LAB — PREPARE RBC (CROSSMATCH)

## 2014-11-12 LAB — VANCOMYCIN, TROUGH: Vancomycin Tr: 19 ug/mL (ref 10–20)

## 2014-11-12 LAB — POTASSIUM: POTASSIUM: 3.3 mmol/L — AB (ref 3.5–5.1)

## 2014-11-12 MED ORDER — POTASSIUM CHLORIDE CRYS ER 20 MEQ PO TBCR: 40.0000 meq | EXTENDED_RELEASE_TABLET | ORAL | Status: DC

## 2014-11-12 MED ORDER — POTASSIUM CHLORIDE 10 MEQ/100ML IV SOLN
10.0000 meq | INTRAVENOUS | Status: AC
  Administered 2014-11-12 (×4): 10 meq via INTRAVENOUS
  Filled 2014-11-12 (×4): qty 100

## 2014-11-12 MED ORDER — FILGRASTIM 480 MCG/1.6ML IJ SOLN
480.0000 ug | Freq: Every day | INTRAMUSCULAR | Status: DC
  Administered 2014-11-13 – 2014-11-18 (×6): 480 ug via SUBCUTANEOUS
  Filled 2014-11-12 (×6): qty 1.6

## 2014-11-12 MED ORDER — POTASSIUM CHLORIDE 20 MEQ PO PACK
40.0000 meq | PACK | Freq: Once | ORAL | Status: AC
  Administered 2014-11-12: 13:00:00 40 meq via ORAL
  Filled 2014-11-12: qty 2

## 2014-11-12 MED ORDER — METOPROLOL TARTRATE 25 MG PO TABS
25.0000 mg | ORAL_TABLET | Freq: Two times a day (BID) | ORAL | Status: DC
  Administered 2014-11-12 – 2014-11-19 (×15): 25 mg via ORAL
  Filled 2014-11-12 (×15): qty 1

## 2014-11-12 MED ORDER — SODIUM CHLORIDE 0.9 % IV SOLN
Freq: Once | INTRAVENOUS | Status: AC
  Administered 2014-11-12: 15:00:00 via INTRAVENOUS

## 2014-11-12 NOTE — Progress Notes (Signed)
[place] at [place] NAME: Caleb Francis    MR#:  [id number]  DATE OF BIRTH:  [birth year]  SUBJECTIVE:  CHIEF COMPLAINT:   Chief Complaint  Patient presents with  . Extremity Pain  - Patient is more alert and talking today - denies any pain - labs reviewed low hb, platelets and potassium  REVIEW OF SYSTEMS:  Review of Systems  Constitutional: Positive for malaise/fatigue. Negative for fever and chills.  Eyes: Negative for blurred vision.  Respiratory: Positive for cough, sputum production and shortness of breath.   Cardiovascular: Positive for chest pain and palpitations.  Gastrointestinal: Positive for abdominal pain. Negative for heartburn and nausea.  Genitourinary: Negative for dysuria.     DRUG ALLERGIES:  No Known Allergies  VITALS:  Blood pressure 136/62, pulse 103, temperature 97.7 F (36.5 C), temperature source Oral, resp. rate 18, height 6\' 1"  (1.854 m), weight 79.516 kg (175 lb 4.8 oz), SpO2 99 %.  PHYSICAL EXAMINATION:  Physical Exam  GENERAL:  69 y.o.-year-old patient lying in the bed, distress secondary to pain all over. Confused, ill appearing  EYES: Pupils equal, round, reactive to light and accommodation. No scleral icterus. Extraocular muscles intact.  HEENT: Head atraumatic, normocephalic. Oropharynx and nasopharynx clear. Extremely dry mucous membranes. NECK:  Supple, no jugular venous distention. No thyroid enlargement, no tenderness.  LUNGS: Normal breath sounds bilaterally, no wheezing, rales,rhonchi or crepitation. No use of accessory muscles of respiration.  CARDIOVASCULAR: S1, S2 normal. No murmurs, rubs, or gallops.  ABDOMEN: Distended, generalized tenderness.  Bowel sounds present. No organomegaly or mass.  EXTREMITIES: Left leg 2+ edema, right leg no edema NEUROLOGIC: Cranial nerves II through XII are intact. Generalized weakness present. No focal weakness noted. Sensation is intact. Gait  not checked.  Not following commands PSYCHIATRIC:  Patient is more alert, talking today, wife at bedside.  Oriented to self SKIN: No obvious rash, lesion, or ulcer.    LABORATORY PANEL:   CBC  Recent Labs Lab 11/12/14 0503  WBC 3.6*  HGB 6.8*  HCT 20.2*  PLT 17*   ------------------------------------------------------------------------------------------------------------------  Chemistries   Recent Labs Lab 11/08/14 2221 11/08/14 2247  11/12/14 0503  NA 141  --   < > 141  K 5.0  --   < > 2.5*  CL 107  --   < > 110  CO2 23  --   < > 21*  GLUCOSE 76  --   < > 118*  BUN 22*  --   < > 40*  CREATININE 1.12  --   < > 1.44*  CALCIUM 9.1  --   < > 8.2*  MG  --  2.2  --   --   AST 38  --   --   --   ALT 29  --   --   --   ALKPHOS 194*  --   --   --   BILITOT 1.0  --   --   --   < > = values in this interval not displayed. ------------------------------------------------------------------------------------------------------------------  Cardiac Enzymes  Recent Labs Lab 11/08/14 2221  TROPONINI 0.07*   ------------------------------------------------------------------------------------------------------------------  RADIOLOGY:  No results found.  EKG:   Orders placed or performed during the hospital encounter of 11/08/14  . EKG 12-Lead  . EKG 12-Lead    ASSESSMENT AND PLAN:   69 year old African-American male with past medical history significant for mantle cell lymphoma with recurrence, started on chemotherapy 3 days ago, recent  admission for left-sided hydronephrosis status post ureteral stent placement last week, comes to the hospital secondary to worsening of abdominal pain and fevers.  #1 sepsis-Likely intraabdominal. Blood cultures are negative at this time..  - Continue Zosyn at this time. No further fevers - discontinue vanc today- blood cultures are negative. - Urology has been consulted. Blood pressure is stable. Has not required any  pressors.  #2 acute renal failure-secondary to obstructive uropathy, bilateral hydronephrosis. Secondary to worsening retroperitoneal lymphadenopathy. Appreciate urology consult. - Patient already had left-sided stent placement 10 days ago. -Continue to monitor renal function, IV fluids at this time. - If chances of improvement- aggressive chemo- can ask urology to evaluate for right renal stent Will await oncology input today  #3 Hypokalemia- being replaced today  #4 mantle cell lymphoma-diagnosed in September 2014. Now CT with progression of the lymphadenopathy inspite of new chemotherapy. Oncology has been consulted. poor prognosis per oncology - Dr. Mike Gip to see patient today. Wife at bedside - Patient is DNR  #5 anemia and Thrombocytopenia- -Likely acute from recent chemotherapy.  - hb <7 and plts <20k- will transfuse today- no active bleeding, SQ heparn stopped  #6 . Gout-restarted allopurinol especially with his recent chemotherapy, to help with tumor lysis syndrome.  #7 DVT prophylaxis- subcutaneous heparin.  #6 hypertension-BP is better now, will restart low dose metoprolol today   All the records are reviewed and case discussed with Care Management/Social Workerr. Management plans discussed with the patient, family and they are in agreement.  CODE STATUS: DNR  TOTAL TIME SPENT IN TAKING CARE OF THIS PATIENT: 42 minutes.   POSSIBLE D/C IN 3 DAYS, DEPENDING ON CLINICAL CONDITION. Discussed with Dr. Clent Jacks M.D on 11/12/2014 at 12:58 PM  Between 7am to 6pm - Pager - [phone]  After 6pm go to [email or web address] - password [email or web address] [facility]  [place] [facility]  Office  [phone]  CC: Primary care physician; No PCP Per Patient

## 2014-11-12 NOTE — Plan of Care (Signed)
Problem: Discharge Progression Outcomes Goal: Discharge plan in place and appropriate Outcome: Progressing Individualization of Care:   PMH: Arthritis, HTN, RA, Anemia, CKD, and Lymphoma (Diagnosed Sept. 2014).    High Fall Risk - safety checks with hourly rounding.  Goal: Other Discharge Outcomes/Goals Outcome: Progressing Patient remains confused. Ativan given with some improvement. Family at bedside, supportive and involved with care. Foley remains in place, NS running at 75 ml/hr. Small smear on 5/30, Miralax and scheduled Senna given. Multiple requests to be put on bedpan continues this shift.

## 2014-11-12 NOTE — Clinical Documentation Improvement (Addendum)
  H&P and progress notes state "Chronic Kidney Disease". 11/12/14 progress note-"Acute renal failure secondary to obstructive uropathy."  11/10/14:  BUN= 41; Crea= 1.40; GFR= 59 11/11/14:  BUN= 44; Crea= 1.59; GFR= 50 11/12/14:  BUN= 40; Crea= 1.44; GFR= 57  1. Please clarify the stage of the chronic kidney disease:    Stage 1    Stage 2 (mild)   Stage 3 (moderate)   Stage 4 (severe)   Stage 5     Other (please specify) ____________________    Karen Kays to determine   Pryor Montes, BSN, RN [place] HIM/Clinical Documentation Specialist [email or web address] [phone]  - Not aware of CKD, has acute renal failure on admissio.

## 2014-11-12 NOTE — Consult Note (Signed)
Palliative Medicine Inpatient Consult Note   Name: ZAXTON ANGERER Date: 11/12/2014 MRN: [id number]  DOB: Jul 24, 1945  Referring Physician: Gladstone Lighter, MD  Palliative Care consult requested for this 69 y.o. male for goals of medical therapy in patient with mantle cell lymphoma, progressing on chemo, admitted with abd pain, fever  Mr Strawderman is a 69 yo man with PMH of mantle cell lymphoma dx 02/2013 on salvage chemo, HTN, RA, 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 He was admitted 11/08/14 with abd pain, fever, altered mental status. He is being tx'd for presumed sepsis. He also has A/CKD. At present , pt is lying in bed. Appears uncomfortable but denies pain, shortness of breath. Says he wants to go home. Wife at bedside.     REVIEW OF SYSTEMS:  Pain: None Dyspnea:  No Nausea/Vomiting:  No Diarrhea:  No Constipation:   No Depression:   No Anxiety:   No Fatigue:   No   SOCIAL HISTORY: Pt is married but does not live with his wife. He has a son and a daughter. He worked for [employer] until 2006 when he became disabled due to his RA.   reports that he quit smoking about 3 weeks ago. His smoking use included Cigarettes. He has a 30 pack-year smoking history. He does not have any smokeless tobacco history on file. He reports that he does not drink alcohol or use illicit drugs.  CODE STATUS: DNR  PAST MEDICAL HISTORY: Past Medical History  Diagnosis Date  . Arthritis   . Hypertension   . RA (rheumatoid arthritis)   . Anemia   . Cancer     lymphoma  . History of nuclear stress test     a. 12/2013: low risk, no sig ischemia, no EKG changes, no artifact, EF 63%  . Chronic kidney disease     PAST SURGICAL HISTORY:  Past Surgical History  Procedure Laterality Date  . Back surgery    . Fracture surgery      ankle  . Portacath placement    . Cystoscopy w/ ureteral stent placement Left 10/24/2014    Procedure: CYSTOSCOPY WITH RETROGRADE PYELOGRAM/URETERAL STENT  PLACEMENT;  Surgeon: Irine Seal, MD;  Location: [facility] ORS;  Service: Urology;  Laterality: Left;    ALLERGIES:  has No Known Allergies.  MEDICATIONS:  Current Facility-Administered Medications  Medication Dose Route Frequency Provider Last Rate Last Dose  . 0.9 %  sodium chloride infusion   Intravenous Continuous Gladstone Lighter, MD 75 mL/hr at 11/11/14 2237    . acetaminophen (TYLENOL) suppository 650 mg  650 mg Rectal Q6H PRN Gladstone Lighter, MD   650 mg at 11/09/14 1008  . allopurinol (ZYLOPRIM) tablet 300 mg  300 mg Oral Daily Gladstone Lighter, MD   300 mg at 11/11/14 1019  . antiseptic oral rinse (CPC / CETYLPYRIDINIUM CHLORIDE 0.05%) solution 7 mL  7 mL Mouth Rinse q12n4p Gladstone Lighter, MD   7 mL at 11/09/14 1200  . chlorhexidine (PERIDEX) 0.12 % solution 15 mL  15 mL Mouth Rinse BID Gladstone Lighter, MD   15 mL at 11/11/14 2224  . haloperidol lactate (HALDOL) injection 2 mg  2 mg Intravenous Q6H PRN Lance Coon, MD   2 mg at 11/11/14 0227  . HYDROmorphone (DILAUDID) injection 0.5-1 mg  0.5-1 mg Intravenous Q4H PRN Gladstone Lighter, MD   1 mg at 11/11/14 1524  . ipratropium (ATROVENT) nebulizer solution 0.5 mg  0.5 mg Nebulization Q6H PRN Gladstone Lighter, MD      .  LORazepam (ATIVAN) injection 1 mg  1 mg Intravenous Q6H PRN Gladstone Lighter, MD   1 mg at 11/11/14 2335  . metoprolol (LOPRESSOR) injection 2.5 mg  2.5 mg Intravenous QID PRN Gladstone Lighter, MD      . ondansetron (ZOFRAN) injection 4 mg  4 mg Intravenous Q6H PRN Juluis Mire, MD      . piperacillin-tazobactam (ZOSYN) IVPB 3.375 g  3.375 g Intravenous 3 times per day Vira Blanco, RPH   3.375 g at 11/12/14 0510  . polyethylene glycol (MIRALAX / GLYCOLAX) packet 17 g  17 g Oral Daily PRN Gladstone Lighter, MD   17 g at 11/11/14 2048  . potassium chloride 10 mEq in 100 mL IVPB  10 mEq Intravenous Q1 Hr x 4 Juluis Mire, MD   10 mEq at 11/12/14 0805  . senna-docusate (Senokot-S) tablet 2 tablet  2 tablet  Oral BID Gladstone Lighter, MD   2 tablet at 11/11/14 2223  . vancomycin (VANCOCIN) IVPB 1000 mg/200 mL premix  1,000 mg Intravenous Q12H Vira Blanco, RPH   1,000 mg at 11/12/14 0203   Facility-Administered Medications Ordered in Other Encounters  Medication Dose Route Frequency Provider Last Rate Last Dose  . 0.9 %  sodium chloride infusion   Intravenous Continuous Lequita Asal, MD 20 mL/hr at 11/06/14 1010 20 mL/hr at 11/06/14 1010  . morphine 2 MG/ML injection 2 mg  2 mg Intravenous Q2H PRN Lequita Asal, MD   2 mg at 11/05/14 1422  . sodium chloride 0.9 % injection 10 mL  10 mL Intracatheter PRN Lequita Asal, MD   10 mL at 11/06/14 1011    Vital Signs: BP 136/62 mmHg  Pulse 103  Temp(Src) 97.7 F (36.5 C) (Oral)  Resp 18  Ht 6\' 1"  (1.854 m)  Wt 79.516 kg (175 lb 4.8 oz)  BMI 23.13 kg/m2  SpO2 99% Filed Weights   11/09/14 0509 11/11/14 0549 11/12/14 0553  Weight: 79.9 kg (176 lb 2.4 oz) 77.792 kg (171 lb 8 oz) 79.516 kg (175 lb 4.8 oz)    Estimated body mass index is 23.13 kg/(m^2) as calculated from the following:   Height as of this encounter: 6\' 1"  (1.854 m).   Weight as of this encounter: 79.516 kg (175 lb 4.8 oz).   PHYSICAL EXAM:  General: ill-appearing HEENT: OP clear Neck: Trachea midline  Cardiovascular: regular rhythm, tachycardic Pulmonary/Chest: coarse BS's ant fields Abdominal: distended, nontender, hypoactive bowel sounds GU: No SP tenderness Extremities: 2+ edema Neurological: Grossly nonfocal Skin: no rashes Psychiatric: alert and oriented  LABS: CBC:  Recent Labs Lab 11/11/14 0603 11/12/14 0503  WBC 5.7 3.6*  HGB 7.4* 6.8*  HCT 21.9* 20.2*  PLT 35* 17*   Comprehensive Metabolic Panel:  Recent Labs Lab 11/05/14 1107  11/08/14 2221 11/08/14 2247  11/11/14 0435 11/12/14 0503  NA 129*  < > 141  --   < > 145 141  K 3.5  < > 5.0  --   < > 3.0* 2.5*  CL 93*  < > 107  --   < > 116* 110  CO2 22  < > 23  --   < > 20* 21*   GLUCOSE 133*  < > 76  --   < > 134* 118*  BUN 14  < > 22*  --   < > 44* 40*  CREATININE 1.20  < > 1.12  --   < > 1.59* 1.44*  CALCIUM 8.4*  < >  9.1  --   < > 8.1* 8.2*  MG  --   --   --  2.2  --   --   --   AST 24  --  38  --   --   --   --   ALT 15*  --  29  --   --   --   --   ALKPHOS 142*  --  194*  --   --   --   --   BILITOT 1.1  --  1.0  --   --   --   --   < > = values in this interval not displayed.  IMPRESSION:  Mr Ruggieri is a 69 yo man with PMH of mantle cell lymphoma dx 02/2013 on salvage chemo, HTN, RA, 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 He was admitted 11/08/14 with abd pain, fever, altered mental status. He is being tx'd for presumed sepsis. He also has A/CKD.   I spoke with pt's wife at bedside and then with daughter by phone. Wife says that she can take pt to her home at discharge. However, daughter says that her mother is unable to care for pt at home and knows that pt cannot return home alone.   Daughter will be at hospital tomorrow and I will meet with her at that time to further discuss goals of therapy.  PLAN: As above  More than 50% of the visit was spent in counseling/coordination of care: YES  Time spent: 70 minutes

## 2014-11-12 NOTE — Plan of Care (Signed)
Problem: Discharge Progression Outcomes Goal: Discharge plan in place and appropriate Outcome: Progressing Individualization:  PMH: Arthritis, HTN, RA, Anemia, CKD, and Lymphoma (Diagnosed Sept. 2014).     High Fall Risk - safety checks with hourly rounding.   Goal: Other Discharge Outcomes/Goals Outcome: Progressing Plan of care progress to goal:  C/o pain, dilaudid given PRN for pain. Pt resting quietly after. Tolerating diet.  Pts platelets 17 this am, HGB 6.8. 1 unit of platelets given, 1 unit of RBCs given. Oncology and palliative following, Dr. Ermalinda Memos to meet with family tomorrow.

## 2014-11-12 NOTE — Progress Notes (Signed)
PT Cancellation Note  Patient Details Name: GOLDEN GILREATH MRN: [id number] DOB: Dec 02, 1945   Cancelled Treatment:    Reason Eval/Treat Not Completed: Medical issues which prohibited therapy (Consult received and chart reviewed.  Patient noted with anemia (HgB 6.8), low platelets (17) and hypokalemia (2.5).  Contraindicated for PT evaluation at this time.  will re-attempt at later time/date as patient medically appropriate.  RN informed/aware)   Reyes Ivan. Owens Shark, PT, DPT 11/12/2014, 2:19 PM [phone]

## 2014-11-12 NOTE — Progress Notes (Signed)
Dr. Reece Levy notified about low Platelet count of 17. MD states follow up with rounding doctor.

## 2014-11-12 NOTE — Progress Notes (Signed)
[facility] Hematology/Oncology Progress Note  Date of admission: 11/08/2014  Hospital day:  11/12/2014  Chief Complaint: Caleb Francis is a 69 y.o. male with mantle cell lymphoma admitted with abdominal pain and findings concerning for sepsis.  History of Present Illness: The patient was diagnosed with mantle cell lymphoma in 02/2013.  He received 6 cycles of Bendamustine and Rituxan with a partial response followed by 6 cycles of RCHOP chemotherapy.  Imaging studies revealed persistent disease.  He then received Ibrutinib with follow-up imaging revealing progressive disease.  The [facility] transplant team was consulted regarding salvage chemotherapy followed by autologous transplant if disease responsive.  Prior to initiating therapy, he developed worsening abdominal pain and left sided hydronephrosis.  A stent was placed on 10/24/2014.  He began cycle #1 of 2 planned cycles of RICE chemotherapy on 11/06/2014.  He received Rituxan, 24 hours of continuous ifosfamide, carboplatin and 2 of 3 days of etoposide.  He did not return to clinic on Friday 11/08/2014 to receive his last dose of etoposide.  He was brought to the emergency room.  Imaging studies revealed rapidly progressing lymphoma with enlarging lymph nodes and new right sided hydronephrosis.  The patient was initially in the ICU and has transitioned to the medical oncology unit.  His mental status has fluctuated, but is clear today.  He remains on broad spectrum antibiotics.  He has required platelet transfusion today.  He has had no bleeding.  Urology has been consulted.  Palliative care medicine has been consulted.  Social History: The patient is accompanied by his wife, son, 2 sister-in-laws, and his brother.  Allergies: No Known Allergies  Scheduled Medications: . allopurinol  300 mg Oral Daily  . antiseptic oral rinse  7 mL Mouth Rinse q12n4p  . chlorhexidine  15 mL Mouth Rinse BID  . metoprolol tartrate  25  mg Oral BID  . piperacillin-tazobactam (ZOSYN)  IV  3.375 g Intravenous 3 times per day  . senna-docusate  2 tablet Oral BID   Review of Systems: GENERAL:  Feels "ok".  General fatigue.  Pain controlled.  No fevers or sweats. PERFORMANCE STATUS (ECOG):  3 HEENT:  No visual changes, runny nose, sore throat, mouth sores or tenderness. Lungs: No shortness of breath or cough.  No hemoptysis. Cardiac:  No chest pain, palpitations, orthopnea, or PND. GI:  Abdominal pain and discomfort.  No nausea, vomiting, diarrhea, constipation, melena or hematochezia. GU:  Voiding well.  No urgency, frequency, dysuria, or hematuria. Musculoskeletal:  No back pain.  No joint pain.  No muscle tenderness. Extremities:  Left leg swelling (chronic). Skin:  No rashes or skin changes. Neuro:  No headache, numbness or weakness, balance or coordination issues. Endocrine:  No diabetes, thyroid issues, hot flashes or night sweats. Psych:  No mood changes, depression or anxiety. Pain:  No focal pain. Review of systems:  All other systems reviewed and found to be negative.  Physical Exam: Blood pressure 139/71, pulse 100, temperature 99.5 F (37.5 C), temperature source Oral, resp. rate 18, height 6' 1" (1.854 m), weight 175 lb 4.8 oz (79.516 kg), SpO2 100 %.  GENERAL:  Chronically ill appearing gentleman sitting in bed intermittently rubbing his abdomen. MENTAL STATUS:  Alert and oriented to person, place and time. HEAD:  Normocephalic, atraumatic, face symmetric, no Cushingoid features. EYES:  Brown eyes.  Pupils equal round and reactive to light and accomodation.  No conjunctivitis or scleral icterus. ENT:  Oropharynx clear without lesion.  Tongue normal. Mucous membranes  moist.  RESPIRATORY:  Decreased respiratory excursion.  Clear to auscultation without rales, wheezes or rhonchi. CARDIOVASCULAR:  Regular rate and rhythm without murmur, rub or gallop. ABDOMEN:  Distended. Soft, minimal tenderness on deep  palpation without guarding or rebound.  Active bowel sounds.  No hepatosplenomegaly.  Left sided fullness. SKIN:  No rashes, ulcers or lesions. EXTREMITIES: Left lower extremity edema.  No palpable cords. LYMPH NODES:  Small cervical nodes.  Left axillary node 5-6 cm.  Enlarging 4 cm left inguinal node. NEUROLOGICAL: Unremarkable. PSYCH:  Appropriate.  Results for orders placed or performed during the hospital encounter of 11/08/14 (from the past 48 hour(s))  Basic metabolic panel     Status: Abnormal   Collection Time: 11/11/14  4:35 AM  Result Value Ref Range   Sodium 145 135 - 145 mmol/L   Potassium 3.0 (L) 3.5 - 5.1 mmol/L   Chloride 116 (H) 101 - 111 mmol/L   CO2 20 (L) 22 - 32 mmol/L   Glucose, Bld 134 (H) 65 - 99 mg/dL   BUN 44 (H) 6 - 20 mg/dL   Creatinine, Ser 1.59 (H) 0.61 - 1.24 mg/dL   Calcium 8.1 (L) 8.9 - 10.3 mg/dL   GFR calc non Af Amer 43 (L) >60 mL/min   GFR calc Af Amer 50 (L) >60 mL/min    Comment: (NOTE) The eGFR has been calculated using the CKD EPI equation. This calculation has not been validated in all clinical situations. eGFR's persistently <60 mL/min signify possible Chronic Kidney Disease.    Anion gap 9 5 - 15  CBC     Status: Abnormal   Collection Time: 11/11/14  6:03 AM  Result Value Ref Range   WBC 5.7 3.8 - 10.6 K/uL   RBC 2.69 (L) 4.40 - 5.90 MIL/uL   Hemoglobin 7.4 (L) 13.0 - 18.0 g/dL   HCT 21.9 (L) 40.0 - 52.0 %   MCV 81.6 80.0 - 100.0 fL   MCH 27.6 26.0 - 34.0 pg   MCHC 33.8 32.0 - 36.0 g/dL   RDW 20.0 (H) 11.5 - 14.5 %   Platelets 35 (L) 150 - 440 K/uL  Vancomycin, trough     Status: None   Collection Time: 11/11/14  1:24 PM  Result Value Ref Range   Vancomycin Tr 18 10 - 20 ug/mL  CBC     Status: Abnormal   Collection Time: 11/12/14  5:03 AM  Result Value Ref Range   WBC 3.6 (L) 3.8 - 10.6 K/uL   RBC 2.46 (L) 4.40 - 5.90 MIL/uL   Hemoglobin 6.8 (L) 13.0 - 18.0 g/dL   HCT 20.2 (L) 40.0 - 52.0 %   MCV 82.1 80.0 - 100.0 fL    MCH 27.4 26.0 - 34.0 pg   MCHC 33.4 32.0 - 36.0 g/dL   RDW 20.3 (H) 11.5 - 14.5 %   Platelets 17 (LL) 150 - 440 K/uL    Comment: PLATELET COUNT CONFIRMED BY SMEAR CRITICAL RESULT CALLED TO, READ BACK BY AND VERIFIED WITH: CARMEN HERRERN @ 0630 5.31.16 MPG   Basic metabolic panel     Status: Abnormal   Collection Time: 11/12/14  5:03 AM  Result Value Ref Range   Sodium 141 135 - 145 mmol/L   Potassium 2.5 (LL) 3.5 - 5.1 mmol/L    Comment: CRITICAL RESULT CALLED TO, READ BACK BY AND VERIFIED WITH CHRISTINA WELCH _0  11/12/14 BY AJO    Chloride 110 101 - 111 mmol/L   CO2 21 (L) 22 -  32 mmol/L   Glucose, Bld 118 (H) 65 - 99 mg/dL   BUN 40 (H) 6 - 20 mg/dL   Creatinine, Ser 1.44 (H) 0.61 - 1.24 mg/dL   Calcium 8.2 (L) 8.9 - 10.3 mg/dL   GFR calc non Af Amer 49 (L) >60 mL/min   GFR calc Af Amer 57 (L) >60 mL/min    Comment: (NOTE) The eGFR has been calculated using the CKD EPI equation. This calculation has not been validated in all clinical situations. eGFR's persistently <60 mL/min signify possible Chronic Kidney Disease.    Anion gap 10 5 - 15  Vancomycin, trough     Status: None   Collection Time: 11/12/14 12:13 PM  Result Value Ref Range   Vancomycin Tr 19 10 - 20 ug/mL  Type and screen     Status: None (Preliminary result)   Collection Time: 11/12/14 12:13 PM  Result Value Ref Range   ABO/RH(D) O POS    Antibody Screen NEG    Sample Expiration 11/15/2014    Unit Number [id number]    Blood Component Type RCLI PHER 1    Unit division 00    Status of Unit ISSUED    Transfusion Status OK TO TRANSFUSE    Crossmatch Result Compatible    Unit Number [id number]    Blood Component Type RBC, LR IRR    Unit division 00    Status of Unit ISSUED    Transfusion Status OK TO TRANSFUSE    Crossmatch Result Compatible   Prepare RBC     Status: None   Collection Time: 11/12/14 12:13 PM  Result Value Ref Range   Order Confirmation ORDER PROCESSED BY BLOOD BANK   Prepare  Pheresed Platelets     Status: None (Preliminary result)   Collection Time: 11/12/14 12:13 PM  Result Value Ref Range   Unit Number [id number]    Blood Component Type PLTPHER LRI1    Unit division 00    Status of Unit ISSUED    Transfusion Status OK TO TRANSFUSE   ABO/Rh     Status: None   Collection Time: 11/12/14 12:14 PM  Result Value Ref Range   ABO/RH(D) O POS   Potassium     Status: Abnormal   Collection Time: 11/12/14  4:04 PM  Result Value Ref Range   Potassium 3.3 (L) 3.5 - 5.1 mmol/L   No results found.  Assessment:  BRYAR DAHMS is a 69 y.o. male with rapidly progressive mantle cell lymphoma currently day 6 status post cycle #1 RICE chemotherapy.  I have reviewed imaging studies with radiology which document new right sided hydronephrosis and signficant increase in abdominal adennopathy.    Plan: 1. Hematology/Oncology:  Discussed my review of imaging with radiology with patient and his family.  Disease has increased significantly despite aggressive chemotherapy.  Although he did not receive his last day of etoposide, disease currently appears unresponsive to therapy.  Without a significant response, he is not a candidate for autologous stem cell transplant.  Suspect disease will continue to grow during the next 2 weeks of recovery from chemotherapy.  Patient noted to have thrombocytopenia prior to treatment and circulating mantle cell lymphoma, thus anticipate slow recovery from chemotherapy.  Currently he is receiving platelet transfusion support to maintain platelets > 20,000.  He will also require GCSF support as he did not receive Neulasta in the outpatient department.  Type and cross for possible transfusion of PRBCs tomorrow.  Blood products leukopoor and irradiated.  Patient to discuss the level of support he wishes to continue.  Discussed continued supportive care and Hospice. 2. Infectious Disease:  Cultures negative.  Patient remains on Zosyn.  Vancomycin  discontinued. 3. Fluids/electrolytes/Nutrition:  IVF continue.  Creatinine stable. Patient with a history of chronic hypokalemia.  Patient on allopurinol for tumor lysis precautions.  Left sided hydronephrosis s/p stent placement, now with right sided hydronephrosis.  Appreciate urology consultation.  No plan for right sided stent at this time.  4. Pain/Toxicology:  Patient on Dilaudid prn.  Increasing pain likely secondary to increasing abdominal disease.  Appreciate Palliative Care consult. 5. Disposition:  Patient and family considering options.  Code status DNR.   Lequita Asal, MD  11/12/2014, 8:47 PM

## 2014-11-12 NOTE — Progress Notes (Signed)
Dr. Reece Levy notified potassium is now 2.5, orders for telemetry, Potassium IV 40 once, and repeat potassium in 4 hours.

## 2014-11-13 ENCOUNTER — Telehealth: Payer: Self-pay | Admitting: *Deleted

## 2014-11-13 LAB — CBC WITH DIFFERENTIAL/PLATELET
Band Neutrophils: 1 % (ref 0–10)
Basophils Absolute: 0 10*3/uL (ref 0.0–0.1)
Basophils Relative: 0 % (ref 0–1)
Blasts: 0 %
Eosinophils Absolute: 0.1 10*3/uL (ref 0.0–0.7)
Eosinophils Relative: 7 % — ABNORMAL HIGH (ref 0–5)
HCT: 22.4 % — ABNORMAL LOW (ref 40.0–52.0)
Hemoglobin: 7.5 g/dL — ABNORMAL LOW (ref 13.0–18.0)
Lymphocytes Relative: 21 % (ref 12–46)
Lymphs Abs: 0.4 10*3/uL — ABNORMAL LOW (ref 0.7–4.0)
MCH: 27.3 pg (ref 26.0–34.0)
MCHC: 33.7 g/dL (ref 32.0–36.0)
MCV: 81 fL (ref 80.0–100.0)
Metamyelocytes Relative: 0 %
Monocytes Absolute: 0.1 10*3/uL (ref 0.1–1.0)
Monocytes Relative: 5 % (ref 3–12)
Myelocytes: 0 %
Neutro Abs: 1.3 10*3/uL — ABNORMAL LOW (ref 1.7–7.7)
Neutrophils Relative %: 66 % (ref 43–77)
Platelets: 9 10*3/uL — CL (ref 150–440)
Promyelocytes Absolute: 0 %
RBC: 2.76 MIL/uL — ABNORMAL LOW (ref 4.40–5.90)
RDW: 18.6 % — ABNORMAL HIGH (ref 11.5–14.5)
WBC: 1.9 10*3/uL — ABNORMAL LOW (ref 3.8–10.6)
nRBC: 0 /100 WBC

## 2014-11-13 LAB — CULTURE, BLOOD (ROUTINE X 2)
CULTURE: NO GROWTH
Culture: NO GROWTH

## 2014-11-13 LAB — PREPARE PLATELET PHERESIS: Unit division: 0

## 2014-11-13 LAB — BASIC METABOLIC PANEL
ANION GAP: 7 (ref 5–15)
BUN: 32 mg/dL — ABNORMAL HIGH (ref 6–20)
CHLORIDE: 111 mmol/L (ref 101–111)
CO2: 21 mmol/L — AB (ref 22–32)
CREATININE: 1.32 mg/dL — AB (ref 0.61–1.24)
Calcium: 8.3 mg/dL — ABNORMAL LOW (ref 8.9–10.3)
GFR calc non Af Amer: 54 mL/min — ABNORMAL LOW (ref >60)
Glucose, Bld: 98 mg/dL (ref 65–99)
POTASSIUM: 2.5 mmol/L — AB (ref 3.5–5.1)
Sodium: 139 mmol/L (ref 135–145)

## 2014-11-13 LAB — TYPE AND SCREEN
ABO/RH(D): O POS
Antibody Screen: NEGATIVE
UNIT DIVISION: 0
UNIT DIVISION: 0

## 2014-11-13 LAB — PLATELET COUNT: Platelets: 16 10*3/uL — CL (ref 150–440)

## 2014-11-13 LAB — URIC ACID: Uric Acid, Serum: 3.8 mg/dL — ABNORMAL LOW (ref 4.4–7.6)

## 2014-11-13 MED ORDER — POTASSIUM CHLORIDE 10 MEQ/100ML IV SOLN
10.0000 meq | INTRAVENOUS | Status: AC
  Administered 2014-11-13 (×2): 10 meq via INTRAVENOUS
  Filled 2014-11-13 (×4): qty 100

## 2014-11-13 MED ORDER — POTASSIUM CHLORIDE 20 MEQ PO PACK
40.0000 meq | PACK | Freq: Two times a day (BID) | ORAL | Status: DC
  Administered 2014-11-13 – 2014-11-19 (×13): 40 meq via ORAL
  Filled 2014-11-13 (×14): qty 2

## 2014-11-13 MED ORDER — ALUM & MAG HYDROXIDE-SIMETH 200-200-20 MG/5ML PO SUSP
30.0000 mL | Freq: Four times a day (QID) | ORAL | Status: DC | PRN
  Administered 2014-11-13 – 2014-11-19 (×5): 30 mL via ORAL
  Filled 2014-11-13 (×5): qty 30

## 2014-11-13 MED ORDER — DIPHENHYDRAMINE HCL 25 MG PO CAPS
25.0000 mg | ORAL_CAPSULE | Freq: Once | ORAL | Status: AC
  Administered 2014-11-13: 25 mg via ORAL
  Filled 2014-11-13: qty 1

## 2014-11-13 MED ORDER — SODIUM CHLORIDE 0.9 % IV SOLN
Freq: Once | INTRAVENOUS | Status: AC
  Administered 2014-11-13: 16:00:00 via INTRAVENOUS

## 2014-11-13 MED ORDER — POTASSIUM CHLORIDE 10 MEQ/100ML IV SOLN
10.0000 meq | INTRAVENOUS | Status: AC
  Administered 2014-11-13 (×2): 10 meq via INTRAVENOUS
  Filled 2014-11-13 (×2): qty 100

## 2014-11-13 MED ORDER — METHYLPREDNISOLONE SODIUM SUCC 125 MG IJ SOLR
60.0000 mg | INTRAMUSCULAR | Status: AC
  Administered 2014-11-13: 19:00:00 60 mg via INTRAVENOUS
  Filled 2014-11-13: qty 2

## 2014-11-13 MED ORDER — METHYLPREDNISOLONE SODIUM SUCC 125 MG IJ SOLR
60.0000 mg | Freq: Four times a day (QID) | INTRAMUSCULAR | Status: DC
  Administered 2014-11-14 (×2): 60 mg via INTRAVENOUS
  Filled 2014-11-13 (×2): qty 2

## 2014-11-13 NOTE — Progress Notes (Signed)
[place] referral received following a Palliative Care consult/family meeting. Writer spoke briefly to patient's daughter Benjamine Mola, plan made to meet tomorrow morning 6/2 to discuss services and sign consents. Current plan is for patient to [facility] to [facility]. Ful note to follow after that meeting. Flo Shanks RN, BSN, [facility] [place], [facility] [phone]

## 2014-11-13 NOTE — Progress Notes (Signed)
PT Cancellation Note  Patient Details Name: Caleb Francis MRN: [id number] DOB: 06/21/1945   Cancelled Treatment:    Reason Eval/Treat Not Completed: Medical issues which prohibited therapy (Per discussion with Dr Phifer with palliative care, patient planning transition to [place] next date due to continued decline in medical status.  Recommends discontinuation of PT orders at this time.  Will complete order; please re-consult should needs change.)   Lois Ostrom H. Owens Shark, PT, DPT 11/13/2014, 3:18 PM [phone]

## 2014-11-13 NOTE — Progress Notes (Signed)
PT Cancellation Note  Patient Details Name: Caleb Francis MRN: [id number] DOB: Sep 26, 1945   Cancelled Treatment:    Reason Eval/Treat Not Completed: Medical issues which prohibited therapy (Patient continues with persistent low HgB (7.5), low platelets (9) and hypokalemia (2.5) despite replacement.  PT evaluation remains contraindicated at this time.   Will re-attempt at later time/date as patient medically appropriate.)   [name] H. Owens Shark, PT, DPT 11/13/2014, 11:35 AM [phone]

## 2014-11-13 NOTE — Progress Notes (Signed)
[place] [facility] [place] NAME: Caleb Francis    MR#:  [id number]  DATE OF BIRTH:  [birth year]  SUBJECTIVE:  CHIEF COMPLAINT:   Chief Complaint  Patient presents with  . Extremity Pain  - alert, and more interactive, confused at times - wife at bedside. Explained poor prognosis - family meeting today with palliative care  REVIEW OF SYSTEMS:  Review of Systems  Constitutional: Positive for malaise/fatigue. Negative for fever and chills.  Respiratory: Positive for shortness of breath. Negative for cough and wheezing.   Cardiovascular: Negative for chest pain and palpitations.  Gastrointestinal: Positive for abdominal pain. Negative for nausea, vomiting, diarrhea and constipation.  Genitourinary: Negative for dysuria.  Musculoskeletal:       Left leg swelling  Neurological: Negative for dizziness, seizures and headaches.     DRUG ALLERGIES:  No Known Allergies  VITALS:  Blood pressure 134/72, pulse 96, temperature 98.8 F (37.1 C), temperature source Oral, resp. rate 18, height 6\' 1"  (1.854 m), weight 76.295 kg (168 lb 3.2 oz), SpO2 98 %.  PHYSICAL EXAMINATION:  Physical Exam  GENERAL:  69 y.o.-year-old patient lying in the bed, distress secondary to pain all over. Confused, ill appearing  EYES: Pupils equal, round, reactive to light and accommodation. No scleral icterus. Extraocular muscles intact.  HEENT: Head atraumatic, normocephalic. Oropharynx and nasopharynx clear. Extremely dry mucous membranes. NECK:  Supple, no jugular venous distention. No thyroid enlargement, no tenderness.  LUNGS: Normal breath sounds bilaterally, no wheezing, rales,rhonchi or crepitation. No use of accessory muscles of respiration.  CARDIOVASCULAR: S1, S2 normal. No murmurs, rubs, or gallops.  ABDOMEN: Distended, generalized tenderness.  Bowel sounds present. No organomegaly or mass.  EXTREMITIES: Left leg 2+ edema, right leg no  edema NEUROLOGIC: Cranial nerves II through XII are intact. Generalized weakness present. No focal weakness noted. Sensation is intact. Gait not checked.  Not following commands PSYCHIATRIC:  Patient is more alert, talking today, wife at bedside.  Oriented to self SKIN: No obvious rash, lesion, or ulcer.    LABORATORY PANEL:   CBC  Recent Labs Lab 11/13/14 0847  WBC 1.9*  HGB 7.5*  HCT 22.4*  PLT 9*   ------------------------------------------------------------------------------------------------------------------  Chemistries   Recent Labs Lab 11/08/14 2221 11/08/14 2247  11/13/14 0847  NA 141  --   < > 139  K 5.0  --   < > 2.5*  CL 107  --   < > 111  CO2 23  --   < > 21*  GLUCOSE 76  --   < > 98  BUN 22*  --   < > 32*  CREATININE 1.12  --   < > 1.32*  CALCIUM 9.1  --   < > 8.3*  MG  --  2.2  --   --   AST 38  --   --   --   ALT 29  --   --   --   ALKPHOS 194*  --   --   --   BILITOT 1.0  --   --   --   < > = values in this interval not displayed. ------------------------------------------------------------------------------------------------------------------  Cardiac Enzymes  Recent Labs Lab 11/08/14 2221  TROPONINI 0.07*   ------------------------------------------------------------------------------------------------------------------  RADIOLOGY:  No results found.  EKG:   Orders placed or performed during the hospital encounter of 11/08/14  . EKG 12-Lead  . EKG 12-Lead    ASSESSMENT AND PLAN:   69 year old African-American male  with past medical history significant for mantle cell lymphoma with recurrence, started on chemotherapy 3 days ago, recent admission for left-sided hydronephrosis status post ureteral stent placement last week, comes to the hospital secondary to worsening of abdominal pain and fevers.  #1 sepsis-Likely intraabdominal. Blood cultures are negative at this time..  - Continue Zosyn at this time. No further fevers -  discontinue vanc - blood cultures are negative. - Urology has been consulted. Blood pressure is stable. Has not required any pressors.  #2 acute renal failure-secondary to obstructive uropathy, bilateral hydronephrosis. Secondary to worsening retroperitoneal lymphadenopathy. Appreciate urology consult. - Patient already had left-sided stent placement 10 days ago. -Continue to monitor renal function, IV fluids at this time. - If chances of improvement- aggressive chemo- can ask urology to evaluate for right renal stent But looks like poor prognosis  #3 Hypokalemia- being replaced again  #4 mantle cell lymphoma-diagnosed in September 2014. Now CT with rapid progression of the lymphadenopathy - on new chemotherapy which was just started. Appreciate oncology consult. poor prognosis per oncology Wife at bedside - Patient is DNR  #5 anemia and Thrombocytopenia- - likely from lymphoma itself, circulating mantle cells seen per pathologist. - hb just improved to 7.5 from 6.8 after 2units TX plts dropped to 7k inspite of 1unit Tx yesterday - will transfuse platelets again today- no active bleeding, SQ heparin stopped - Leukopenia- neupogen started by oncologist  #6 . Gout-restarted allopurinol especially with his recent chemotherapy, to help with tumor lysis syndrome.  #7 DVT prophylaxis- subcutaneous heparin.  #6 hypertension-BP is better now, on metoprolol   Discussed with Dr. Ermalinda Memos and Dr. Mike Gip today  All the records are reviewed and case discussed with Care Management/Social Workerr. Management plans discussed with the patient, family and they are in agreement.  CODE STATUS: DNR  TOTAL TIME SPENT IN TAKING CARE OF THIS PATIENT: 42 minutes.   Likely will need to get Hospice involved.  Gladstone Lighter M.D on 11/13/2014 at 1:34 PM  Between 7am to 6pm - Pager - [phone]  After 6pm go to [email or web address] - password [email or web address] [facility]  [place] [facility]  Office   [phone]  CC: Primary care physician; No PCP Per Patient

## 2014-11-13 NOTE — Consult Note (Signed)
I met with pt's daughter and then with pt in the presence of his daughter. Pt understands that there are no further tx options for him and that his lymphoma is progressing. Pt also understands that he is unable to care for himself at home. We discussed transfer to the [place] and pt and daughter are in agreement with this. [place] notified. Will likely d/c tomorrow.

## 2014-11-13 NOTE — Telephone Encounter (Signed)
Error

## 2014-11-13 NOTE — Plan of Care (Signed)
Problem: Discharge Progression Outcomes Goal: Other Discharge Outcomes/Goals Plan of Care Progress to Goal: Pt will tsfer to [facility] tomorrow.  K+ was 2.5 and we replaced all day.  Platelets were 9 and we got order to transfuse 2 units of platelets.  Near completion of 1st unit, when I went to check on pt - Eyes and lips were swollen.  Transfusion was stopped.  Blood bank and pathologist was called.  Prime doc called and solumedrol was ordered.  On call oncologist, Georgeanne Nim PA was called.  She sd to call her back w/Platelet count to determine if 2nd unit would be given.  Solumedrol will also be given q6hrs.

## 2014-11-13 NOTE — Plan of Care (Signed)
Problem: Discharge Progression Outcomes Goal: Other Discharge Outcomes/Goals Outcome: Progressing Plan of care progress to goal for: Pain-no c/o pain this shift Hemodynamically-VSS, pt given 1 unit pRBC this shift with no reaction Complications-pt complained of constipation this shift, senna given and prune juice, pt had a very large stool  Diet-pt tolerating diet at this time Activity-pt has good mobility in bed.

## 2014-11-13 NOTE — Consult Note (Signed)
Palliative Medicine Inpatient Consult Follow Up Note   Name: Caleb Francis Date: 11/13/2014 MRN: [id number]  DOB: 1946/04/11  Referring Physician: Gladstone Lighter, MD  Palliative Care consult requested for this 69 y.o. male for goals of medical therapy in patient with mantle cell lymphoma, progressing on chemo, admitted with abd pain, fever  Caleb Langenbach is lying in bed. Awake, answers questions though somewhat confused. Wife at bedside.   REVIEW OF SYSTEMS:  Pain: None Dyspnea:  No Nausea/Vomiting:  No Diarrhea:  No Constipation:   No Depression:   No Anxiety:   No Fatigue:   Yes  CODE STATUS: DNR   PAST MEDICAL HISTORY: Past Medical History  Diagnosis Date  . Arthritis   . Hypertension   . RA (rheumatoid arthritis)   . Anemia   . Cancer     lymphoma  . History of nuclear stress test     a. 12/2013: low risk, no sig ischemia, no EKG changes, no artifact, EF 63%  . Chronic kidney disease     PAST SURGICAL HISTORY:  Past Surgical History  Procedure Laterality Date  . Back surgery    . Fracture surgery      ankle  . Portacath placement    . Cystoscopy w/ ureteral stent placement Left 10/24/2014    Procedure: CYSTOSCOPY WITH RETROGRADE PYELOGRAM/URETERAL STENT PLACEMENT;  Surgeon: Irine Seal, MD;  Location: [facility] ORS;  Service: Urology;  Laterality: Left;    Vital Signs: BP 134/72 mmHg  Pulse 96  Temp(Src) 98.8 F (37.1 C) (Oral)  Resp 18  Ht 6\' 1"  (1.854 m)  Wt 76.295 kg (168 lb 3.2 oz)  BMI 22.20 kg/m2  SpO2 98% Filed Weights   11/11/14 0549 11/12/14 0553 11/13/14 0521  Weight: 77.792 kg (171 lb 8 oz) 79.516 kg (175 lb 4.8 oz) 76.295 kg (168 lb 3.2 oz)    Estimated body mass index is 22.2 kg/(m^2) as calculated from the following:   Height as of this encounter: 6\' 1"  (1.854 m).   Weight as of this encounter: 76.295 kg (168 lb 3.2 oz).  PHYSICAL EXAM: General: ill-appearing HEENT: OP clear Neck: Trachea midline  Cardiovascular: regular  rhythm Pulmonary/Chest: coarse BS's ant fields Abdominal: distended, nontender, hypoactive bowel sounds GU: No SP tenderness Extremities: 2+ edema Neurological: Grossly nonfocal Skin: no rashes Psychiatric: alert and oriented LABS: CBC:    Component Value Date/Time   WBC 1.9* 11/13/2014 0847   WBC 6.4 07/17/2014 1121   HGB 7.5* 11/13/2014 0847   HGB 11.8* 07/17/2014 1121   HCT 22.4* 11/13/2014 0847   HCT 35.0* 07/17/2014 1121   PLT 9* 11/13/2014 0847   PLT 263 07/17/2014 1121   MCV 81.0 11/13/2014 0847   MCV 83 07/17/2014 1121   NEUTROABS PENDING 11/13/2014 0847   NEUTROABS 3.4 07/17/2014 1121   LYMPHSABS PENDING 11/13/2014 0847   LYMPHSABS 1.9 07/17/2014 1121   MONOABS PENDING 11/13/2014 0847   MONOABS 0.8 07/17/2014 1121   EOSABS PENDING 11/13/2014 0847   EOSABS 0.2 07/17/2014 1121   BASOSABS PENDING 11/13/2014 0847   BASOSABS 0.0 07/17/2014 1121   Comprehensive Metabolic Panel:    Component Value Date/Time   NA 139 11/13/2014 0847   NA 141 07/17/2014 1121   K 2.5* 11/13/2014 0847   K 2.8* 07/17/2014 1121   CL 111 11/13/2014 0847   CL 100 07/17/2014 1121   CO2 21* 11/13/2014 0847   CO2 32 07/17/2014 1121   BUN 32* 11/13/2014 0847   BUN 9 07/17/2014 1121  CREATININE 1.32* 11/13/2014 0847   CREATININE 1.09 07/17/2014 1121   GLUCOSE 98 11/13/2014 0847   GLUCOSE 105* 07/17/2014 1121   CALCIUM 8.3* 11/13/2014 0847   CALCIUM 8.9 07/17/2014 1121   AST 38 11/08/2014 2221   AST 17 07/17/2014 1121   ALT 29 11/08/2014 2221   ALT 12* 07/17/2014 1121   ALKPHOS 194* 11/08/2014 2221   ALKPHOS 79 07/17/2014 1121   BILITOT 1.0 11/08/2014 2221   PROT 6.6 11/08/2014 2221   PROT 7.3 07/17/2014 1121   ALBUMIN 2.6* 11/08/2014 2221   ALBUMIN 3.1* 07/17/2014 1121    IMPRESSION: Caleb Francis is a 69 yo man with PMH of mantle cell lymphoma dx 02/2013 on salvage chemo, HTN, RA, 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 He was admitted 11/08/14 with abd pain, fever,  altered mental status. He is being tx'd for presumed sepsis. He also has A/CKD.   Recent imaging studies show significant disease progression of lymphoma despite aggressive chemo. Dr Mike Gip spoke with pt and family yesterday and updated them on pt's condition. Daughter to be at hospital today and I will meet with pt and family at that time to further address goals of therapy.   PLAN: Family meeting today  REFERRALS TO BE ORDERED:  Chaplain   More than 50% of the visit was spent in counseling/coordination of care: YES  Time spent: 35 minutes

## 2014-11-13 NOTE — Progress Notes (Signed)
Speech Therapy Note: reviewed chart notes; NSG indicated pt was more alert now and may want to upgrade diet consistency. Family is bringing in solid foods per their report. Attempted to work w/ pt, however, he was sleeping and family requested SLP to work w/ pt tomorrow. ST f/u then. NSG updated.

## 2014-11-14 LAB — PREPARE PLATELET PHERESIS
UNIT DIVISION: 0
Unit division: 0

## 2014-11-14 LAB — CBC WITH DIFFERENTIAL/PLATELET
Basophils Absolute: 0 10*3/uL (ref 0–0.1)
Basophils Relative: 1 %
Eosinophils Absolute: 0 10*3/uL (ref 0–0.7)
Eosinophils Relative: 0 %
HCT: 22.4 % — ABNORMAL LOW (ref 40.0–52.0)
Hemoglobin: 7.5 g/dL — ABNORMAL LOW (ref 13.0–18.0)
Lymphocytes Relative: 25 %
Lymphs Abs: 0.2 10*3/uL — ABNORMAL LOW (ref 1.0–3.6)
MCH: 27.3 pg (ref 26.0–34.0)
MCHC: 33.5 g/dL (ref 32.0–36.0)
MCV: 81.5 fL (ref 80.0–100.0)
Monocytes Absolute: 0.1 10*3/uL — ABNORMAL LOW (ref 0.2–1.0)
Monocytes Relative: 6 %
Neutro Abs: 0.6 10*3/uL — ABNORMAL LOW (ref 1.4–6.5)
Neutrophils Relative %: 68 %
Platelets: 17 10*3/uL — CL (ref 150–440)
RBC: 2.74 MIL/uL — ABNORMAL LOW (ref 4.40–5.90)
RDW: 18.7 % — ABNORMAL HIGH (ref 11.5–14.5)
WBC: 0.9 10*3/uL — CL (ref 3.8–10.6)

## 2014-11-14 MED ORDER — ENSURE ENLIVE PO LIQD
237.0000 mL | Freq: Two times a day (BID) | ORAL | Status: DC
  Administered 2014-11-14 – 2014-11-19 (×11): 237 mL via ORAL

## 2014-11-14 MED ORDER — PREDNISONE 50 MG PO TABS
50.0000 mg | ORAL_TABLET | Freq: Every day | ORAL | Status: DC
  Administered 2014-11-15 – 2014-11-19 (×5): 50 mg via ORAL
  Filled 2014-11-14 (×5): qty 1

## 2014-11-14 NOTE — Progress Notes (Signed)
Initial Nutrition Assessment  DOCUMENTATION CODES:     INTERVENTION: Meals and Snacks: Cater to patient preferences; RD notes family is also bringing in foods. SLP ordered regular diet and pt was eating pizza and ice cream on visit. Noted pt was on dysphagia I diet order previously. Medical Food Supplement Therapy: Ensure Enlive po BID, each supplement provides 350 kcal and 20 grams of protein    NUTRITION DIAGNOSIS:  Inadequate oral intake related to cancer and cancer related treatments as evidenced by per patient/family report, meal completion < 50%.  GOAL:  Patient will meet greater than or equal to 90% of their needs  MONITOR:   (Energy Intake, Electrolyte and renal Profile, Digestive system, Anthropometrics)  REASON FOR ASSESSMENT:   (RD Screen, Length of Stay)    ASSESSMENT:  Pt admitted with sepsis, acute renal failure and for pain management. Per MD note pt discharging to Hospice Home versus SNF. Pt with mantle cell lymphoma. PMhx: Past Medical History  Diagnosis Date  . Arthritis   . Hypertension   . RA (rheumatoid arthritis)   . Anemia   . Cancer     lymphoma  . History of nuclear stress test     a. 12/2013: low risk, no sig ischemia, no EKG changes, no artifact, EF 63%  . Chronic kidney disease    PO Intake: pt with poor po intake during admission per MD note and Nsg report. Per I and O chart pt ate 100% of meal one times since admission, usually 0% or bites of meals. Pt ate 50% of pizza and almost all of ice cream on visit was still eating ice cream with roughly 25% of Ensure. RD note pt was on dysphagia I diet order when eating less, recently upgraded.   Medications: KCl, Prednisone, Senokot, NS at 58mL/hr Labs: Electrolyte and Renal Profile:  Recent Labs Lab 11/08/14 2247  11/11/14 0435 11/12/14 0503 11/12/14 1604 11/13/14 0847  BUN  --   < > 44* 40*  --  32*  CREATININE  --   < > 1.59* 1.44*  --  1.32*  NA  --   < > 145 141  --  139  K  --   <  > 3.0* 2.5* 3.3* 2.5*  MG 2.2  --   --   --   --   --   PHOS 2.9  --   --   --   --   --   < > = values in this interval not displayed. Protein Profile:  Recent Labs Lab 11/08/14 2221  ALBUMIN 2.6*   Glucose Profile: No results for input(s): GLUCAP in the last 72 hours. Nutritional Anemia Profile:  CBC Latest Ref Rng 11/14/2014 11/13/2014 11/13/2014  WBC 3.8 - 10.6 K/uL 0.9(LL) - 1.9(L)  Hemoglobin 13.0 - 18.0 g/dL 7.5(L) - 7.5(L)  Hematocrit 40.0 - 52.0 % 22.4(L) - 22.4(L)  Platelets 150 - 440 K/uL 17(LL) 16(LL) 9(LL)   Per [facility] pt has had weight gain over the past month. Last weights during admission:  Filed Weights   11/12/14 0553 11/13/14 0521 11/14/14 0609  Weight: 175 lb 4.8 oz (79.516 kg) 168 lb 3.2 oz (76.295 kg) 173 lb (78.472 kg)    Height:  Ht Readings from Last 1 Encounters:  11/09/14 6\' 1"  (1.854 m)    Weight:  Wt Readings from Last 1 Encounters:  11/14/14 173 lb (78.472 kg)    Ideal Body Weight:     Wt Readings from Last 10 Encounters:  11/14/14 173 lb (78.472 kg)  11/05/14 164 lb 0.4 oz (74.4 kg)  10/24/14 165 lb (74.844 kg)  10/24/14 169 lb 5 oz (76.8 kg)  10/22/14 168 lb (76.204 kg)    BMI:  Body mass index is 22.83 kg/(m^2).   Unable to complete Nutrition-Focused physical exam at this time.   Estimated Nutritional Needs:  Kcal:  2083-2461kcals, BEE: 1578kcals, TEE: (IF 1.1-1.3)(AF 1.2)   Protein:  79-94g protein (1.0-1.2g/kg)   Fluid:  1965-2369mL of fluid (25-18mL/kg)  Skin:  Reviewed, no issues  Diet Order:  Diet regular Room service appropriate?: Yes; Fluid consistency:: Thin  EDUCATION NEEDS:  No education needs identified at this time   Intake/Output Summary (Last 24 hours) at 11/14/14 1417 Last data filed at 11/14/14 1037  Gross per 24 hour  Intake  146.3 ml  Output   4050 ml  Net -3903.7 ml    Last BM:  6/1  MODERATE Care Level  Dwyane Luo, RD, LDN Pager [phone]

## 2014-11-14 NOTE — Progress Notes (Signed)
Per conversation with palliative Medicine physician Dr. Ermalinda Memos and patient's daughter Benjamine Mola, patient's oncologist Dr. Mike Gip plans to discuss possible further treatment options with the patient and his family. Patient will not transfer to the [facility] today. Referral on hold at this time. Thank you. Flo Shanks RN, BSN, [place] [facility] [place], [facility] [phone] c

## 2014-11-14 NOTE — Consult Note (Signed)
Palliative Medicine Inpatient Consult Follow Up Note   Name: Caleb Francis Date: 11/14/2014 MRN: [id number]  DOB: 07/07/1945  Referring Physician: Gladstone Lighter, MD  Palliative Care consult requested for this 69 y.o. male for goals of medical therapy in patient with mantle cell lymphoma, progressing on chemo, admitted with abd pain, fever  Caleb Francis is lying in bed. Awake, answers questions appropriately. Complains of "indigestion" relieved by Mylanta. Abd pain improved. Daughter at bedside.    REVIEW OF SYSTEMS:  Pain: Moderate Dyspnea:  Yes and with exertion Nausea/Vomiting:  anorexia Diarrhea:  No Constipation:   No Depression:   No Anxiety:   No Fatigue:   Yes  CODE STATUS: DNR   PAST MEDICAL HISTORY: Past Medical History  Diagnosis Date  . Arthritis   . Hypertension   . RA (rheumatoid arthritis)   . Anemia   . Cancer     lymphoma  . History of nuclear stress test     a. 12/2013: low risk, no sig ischemia, no EKG changes, no artifact, EF 63%  . Chronic kidney disease     PAST SURGICAL HISTORY:  Past Surgical History  Procedure Laterality Date  . Back surgery    . Fracture surgery      ankle  . Portacath placement    . Cystoscopy w/ ureteral stent placement Left 10/24/2014    Procedure: CYSTOSCOPY WITH RETROGRADE PYELOGRAM/URETERAL STENT PLACEMENT;  Surgeon: Irine Seal, MD;  Location: [facility] ORS;  Service: Urology;  Laterality: Left;    Vital Signs: BP 125/75 mmHg  Pulse 86  Temp(Src) 98 F (36.7 C) (Oral)  Resp 16  Ht 6\' 1"  (1.854 m)  Wt 78.472 kg (173 lb)  BMI 22.83 kg/m2  SpO2 99% Filed Weights   11/12/14 0553 11/13/14 0521 11/14/14 0609  Weight: 79.516 kg (175 lb 4.8 oz) 76.295 kg (168 lb 3.2 oz) 78.472 kg (173 lb)    Estimated body mass index is 22.83 kg/(m^2) as calculated from the following:   Height as of this encounter: 6\' 1"  (1.854 m).   Weight as of this encounter: 78.472 kg (173 lb).  PHYSICAL EXAM: General: ill-appearing HEENT:  OP clear Neck: Trachea midline  Cardiovascular: regular rhythm Pulmonary/Chest: coarse BS's ant fields Abdominal: distended, nontender, hypoactive bowel sounds GU: No SP tenderness Extremities: 2+ edema Neurological: Grossly nonfocal Skin: no rashes Psychiatric: alert and oriented  LABS: CBC:    Component Value Date/Time   WBC 0.9* 11/14/2014 0521   WBC 6.4 07/17/2014 1121   HGB 7.5* 11/14/2014 0521   HGB 11.8* 07/17/2014 1121   HCT 22.4* 11/14/2014 0521   HCT 35.0* 07/17/2014 1121   PLT 17* 11/14/2014 0521   PLT 263 07/17/2014 1121   MCV 81.5 11/14/2014 0521   MCV 83 07/17/2014 1121   NEUTROABS 0.6* 11/14/2014 0521   NEUTROABS 3.4 07/17/2014 1121   LYMPHSABS 0.2* 11/14/2014 0521   LYMPHSABS 1.9 07/17/2014 1121   MONOABS 0.1* 11/14/2014 0521   MONOABS 0.8 07/17/2014 1121   EOSABS 0.0 11/14/2014 0521   EOSABS 0.2 07/17/2014 1121   BASOSABS 0.0 11/14/2014 0521   BASOSABS 0.0 07/17/2014 1121   Comprehensive Metabolic Panel:    Component Value Date/Time   NA 139 11/13/2014 0847   NA 141 07/17/2014 1121   K 2.5* 11/13/2014 0847   K 2.8* 07/17/2014 1121   CL 111 11/13/2014 0847   CL 100 07/17/2014 1121   CO2 21* 11/13/2014 0847   CO2 32 07/17/2014 1121   BUN 32* 11/13/2014 0847  BUN 9 07/17/2014 1121   CREATININE 1.32* 11/13/2014 0847   CREATININE 1.09 07/17/2014 1121   GLUCOSE 98 11/13/2014 0847   GLUCOSE 105* 07/17/2014 1121   CALCIUM 8.3* 11/13/2014 0847   CALCIUM 8.9 07/17/2014 1121   AST 38 11/08/2014 2221   AST 17 07/17/2014 1121   ALT 29 11/08/2014 2221   ALT 12* 07/17/2014 1121   ALKPHOS 194* 11/08/2014 2221   ALKPHOS 79 07/17/2014 1121   BILITOT 1.0 11/08/2014 2221   PROT 6.6 11/08/2014 2221   PROT 7.3 07/17/2014 1121   ALBUMIN 2.6* 11/08/2014 2221   ALBUMIN 3.1* 07/17/2014 1121    IMPRESSION: Caleb Francis is a 69 yo man with PMH of mantle cell lymphoma dx 02/2013 on salvage chemo, HTN, RA, 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 He  was admitted 11/08/14 with abd pain, fever, altered mental status. He is being tx'd for presumed sepsis. He also has A/CKD.   Pt says that abd pain improved. Has indigestion relieved with Mylanta. Not eating despite daughter bringing food into him.   Dr Mike Francis will meet with family today to address discharge plan.   PLAN: As above  REFERRALS TO BE ORDERED:  Hospice   More than 50% of the visit was spent in counseling/coordination of care: YES  Time spent: 25 minutes

## 2014-11-14 NOTE — Clinical Social Work Note (Signed)
Clinical Social Worker was consulted for possible SNF placement. CSW completed FL2 in anticipation for SNF placement and it is on the chart for MD's signature. Palliative Care is also following. Pt may go to [facility]. Awaiting a determination of disposition. Assessment to follow. CSW will continue to follow.  Darden Dates, MSW, LCSW Clinical Social Worker  [phone]

## 2014-11-14 NOTE — Plan of Care (Signed)
Problem: Discharge Progression Outcomes Goal: Discharge plan in place and appropriate Outcome: Progressing Individualization:  PMH: Arthritis, HTN, RA, Anemia, CKD, and Lymphoma (Diagnosed Sept. 2014).      High Fall Risk - safety checks with hourly rounding.

## 2014-11-14 NOTE — Progress Notes (Signed)
Speech Therapy Note: diet modified to a regular diet(food consistency that family is bringing in at this time) and pt/family reported he tolerated this food consistency well w/ no problem; both denied any s/s of aspiration and stated pt was eating "much better now". Reviewed chart notes; labs; MD notes.  No further skilled ST services indicated at this time. NSG to reconsult ST services if any change in pt's status. Pt and family agreed.

## 2014-11-14 NOTE — Plan of Care (Signed)
Problem: Discharge Progression Outcomes Goal: Other Discharge Outcomes/Goals Outcome: Progressing Pt alert and oriented, no complaints of pain or discomfort. 2nd unit of platelets infused this shift, no reaction noted, pt tolerated well. Pre-meds given, solumedrol given once complete. Will continue to monitor.

## 2014-11-14 NOTE — Progress Notes (Signed)
Spoke with Georgina Peer, [facility] rep at [phone], to notify of non-emergent EMS transport.  Auth notification reference given as [id number].   Service date range good from 11/14/14 - 02/12/15.   Gap exception requested to determine if services can be considered at an in-network level.

## 2014-11-14 NOTE — Progress Notes (Signed)
   11/14/14 1112  Clinical Encounter Type  Visited With Patient and family together  Visit Type Initial  Consult/Referral To Chaplain  Pastoral visit to patient and his daughter.  Patient said he is doing well, but seems uncertain as to why he is here.  Michela Pitcher he is hoping to be able to go home soon.  [place] [phone]

## 2014-11-14 NOTE — Progress Notes (Signed)
[place] [facility] [place] NAME: Caleb Francis    MR#:  [id number]  DATE OF BIRTH:  [birth year]  SUBJECTIVE:  CHIEF COMPLAINT:   Chief Complaint  Patient presents with  . Extremity Pain  - alert, and more interactive, denies any abdominal pain. - daughter sleeping in the room as well. - poor po intake - Right eye swelling last evening after platelet transfusion  REVIEW OF SYSTEMS:  Review of Systems  Constitutional: Negative for fever and chills.  Respiratory: Negative for cough, shortness of breath and wheezing.   Cardiovascular: Negative for chest pain and palpitations.  Gastrointestinal: Negative for nausea, vomiting, abdominal pain, diarrhea and constipation.  Genitourinary: Negative for dysuria.  Neurological: Negative for dizziness, seizures and headaches.     DRUG ALLERGIES:  No Known Allergies  VITALS:  Blood pressure 125/75, pulse 86, temperature 98 F (36.7 C), temperature source Oral, resp. rate 16, height 6\' 1"  (1.854 m), weight 78.472 kg (173 lb), SpO2 99 %.  PHYSICAL EXAMINATION:  Physical Exam  GENERAL:  69 y.o.-year-old patient lying in the bed, distress secondary to pain all over. Confused, ill appearing  EYES: Pupils equal, round, reactive to light and accommodation. No scleral icterus. Extraocular muscles intact.  HEENT: Head atraumatic, normocephalic. Oropharynx and nasopharynx clear. Extremely dry mucous membranes. NECK:  Supple, no jugular venous distention. No thyroid enlargement, no tenderness.  LUNGS: Normal breath sounds bilaterally, no wheezing, rales, rhonchi or crepitation. No use of accessory muscles of respiration.  CARDIOVASCULAR: S1, S2 normal. No murmurs, rubs, or gallops.  ABDOMEN: Distended, generalized tenderness.  Bowel sounds present. No organomegaly or mass.  EXTREMITIES: Left leg 1+ edema, right leg no edema NEUROLOGIC: Cranial nerves II through XII are intact. Generalized weakness  present. No focal weakness noted. Sensation is intact. Gait not checked.  Not following commands PSYCHIATRIC:  Patient is more alert, and oriented x 2.  SKIN: No obvious rash, lesion, or ulcer.    LABORATORY PANEL:   CBC  Recent Labs Lab 11/14/14 0521  WBC 0.9*  HGB 7.5*  HCT 22.4*  PLT 17*   ------------------------------------------------------------------------------------------------------------------  Chemistries   Recent Labs Lab 11/08/14 2221 11/08/14 2247  11/13/14 0847  NA 141  --   < > 139  K 5.0  --   < > 2.5*  CL 107  --   < > 111  CO2 23  --   < > 21*  GLUCOSE 76  --   < > 98  BUN 22*  --   < > 32*  CREATININE 1.12  --   < > 1.32*  CALCIUM 9.1  --   < > 8.3*  MG  --  2.2  --   --   AST 38  --   --   --   ALT 29  --   --   --   ALKPHOS 194*  --   --   --   BILITOT 1.0  --   --   --   < > = values in this interval not displayed. ------------------------------------------------------------------------------------------------------------------  Cardiac Enzymes  Recent Labs Lab 11/08/14 2221  TROPONINI 0.07*   ------------------------------------------------------------------------------------------------------------------  RADIOLOGY:  No results found.  EKG:   Orders placed or performed during the hospital encounter of 11/08/14  . EKG 12-Lead  . EKG 12-Lead    ASSESSMENT AND PLAN:   69 year old African-American male with past medical history significant for mantle cell lymphoma with recurrence, started on chemotherapy 3  days ago, recent admission for left-sided hydronephrosis status post ureteral stent placement last week, comes to the hospital secondary to worsening of abdominal pain and fevers.  #1 sepsis-Likely intraabdominal. Blood cultures are negative at this time..  - Continue Zosyn at this time. Will need ABX for 10 days. No further fevers - discontinue vanc - blood cultures are negative. - Urology has been consulted. Blood  pressure is stable. Has not required any pressors.  #2 acute renal failure-secondary to obstructive uropathy, bilateral hydronephrosis. Secondary to worsening retroperitoneal lymphadenopathy. Appreciate urology consult. - Patient already had left-sided stent placement 10 days ago. -Continue to monitor renal function, IV fluids at this time.  #3 Hypokalemia- replaced yesterday, labs from today ordered  #4 mantle cell lymphoma-diagnosed in September 2014. Now CT with rapid progression of the lymphadenopathy - on new chemotherapy which was just started. Appreciate oncology consult. poor prognosis per oncology Wife at bedside - Patient is DNR  #5 anemia and Thrombocytopenia- - likely from lymphoma itself  - hb stable at 7.5 after 2units TX - received total 3units plt tx in the last 2 days- plts this am 17k - ? Reaction to platelets with eyelids swelling- improving with IV steroids- change to PO prednisone - Leukopenia- neupogen started by oncologist, likely from recent chemo - oncology to f/u today  #6 . Gout-restarted allopurinol especially with his recent chemotherapy, to help with tumor lysis syndrome.  #7 DVT prophylaxis- subcutaneous heparin.  #6 hypertension-BP is better now, on metoprolol   Overall poor prognosis. Discharge to SNF vs Hospice Home.  All the records are reviewed and case discussed with Care Management/Social Workerr. Management plans discussed with the patient, family and they are in agreement.  CODE STATUS: DNR  TOTAL TIME SPENT IN TAKING CARE OF THIS PATIENT: 42 minutes.   Gladstone Lighter M.D on 11/14/2014 at 11:21 AM  Between 7am to 6pm - Pager - [phone]  After 6pm go to [email or web address] - password [email or web address] [facility]  [place] [facility]  Office  [phone]  CC: Primary care physician; No PCP Per Patient

## 2014-11-14 NOTE — Progress Notes (Signed)
ANTIBIOTIC CONSULT NOTE - FOLLOW UP  Pharmacy Consult for Zosyn  Indication: Sepsis  No Known Allergies  Patient Measurements: Height: 6\' 1"  (185.4 cm) Weight: 173 lb (78.472 kg) IBW/kg (Calculated) : 79.9 Adjusted Body Weight: n/a  Vital Signs: Temp: 98 F (36.7 C) (06/02 0609) Temp Source: Oral (06/02 0609) BP: 125/75 mmHg (06/02 0609) Pulse Rate: 86 (06/02 0609) Intake/Output from previous day: 06/01 0701 - 06/02 0700 In: 506.3 [P.O.:360; Blood:146.3] Out: 1550 [Urine:1550] Intake/Output from this shift:    Labs:  Recent Labs  11/12/14 0503 11/13/14 0847 11/13/14 1934 11/14/14 0521  WBC 3.6* 1.9*  --  0.9*  HGB 6.8* 7.5*  --  7.5*  PLT 17* 9* 16* 17*  CREATININE 1.44* 1.32*  --   --    Estimated Creatinine Clearance: 60.3 mL/min (by C-G formula based on Cr of 1.32).  Recent Labs  11/11/14 1324 11/12/14 1213  [place] 19     Microbiology: Recent Results (from the past 720 hour(s))  Blood Culture (routine x 2)     Status: None   Collection Time: 11/08/14 10:21 PM  Result Value Ref Range Status   Specimen Description BLOOD  Final   Special Requests NONE  Final   Culture NO GROWTH 5 DAYS  Final   Report Status 11/13/2014 FINAL  Final  Blood Culture (routine x 2)     Status: None   Collection Time: 11/08/14 10:22 PM  Result Value Ref Range Status   Specimen Description BLOOD  Final   Special Requests NONE  Final   Culture NO GROWTH 5 DAYS  Final   Report Status 11/13/2014 FINAL  Final  Urine culture     Status: None   Collection Time: 11/09/14  2:30 AM  Result Value Ref Range Status   Specimen Description URINE, RANDOM  Final   Special Requests NONE  Final   Culture NO GROWTH 1 DAY  Final   Report Status 11/10/2014 FINAL  Final    Anti-infectives    Start     Dose/Rate Route Frequency Ordered Stop   11/09/14 1200  vancomycin (VANCOCIN) IVPB 1000 mg/200 mL premix  Status:  Discontinued     1,000 mg 200 mL/hr over 60 Minutes Intravenous  Every 12 hours 11/09/14 0457 11/12/14 1302   11/09/14 0600  piperacillin-tazobactam (ZOSYN) IVPB 3.375 g     3.375 g 12.5 mL/hr over 240 Minutes Intravenous 3 times per day 11/09/14 0457     11/08/14 2230  piperacillin-tazobactam (ZOSYN) IVPB 3.375 g     3.375 g 100 mL/hr over 30 Minutes Intravenous  Once 11/08/14 2228 11/08/14 2351   11/08/14 2230  vancomycin (VANCOCIN) IVPB 1000 mg/200 mL premix     1,000 mg 200 mL/hr over 60 Minutes Intravenous  Once 11/08/14 2228 11/09/14 0102      Assessment: Patient continues on Zosyn 3.375g IV q8h EI for sepsis. Patient is neutropenic due to lymphoma and chemotherapy, currently receiving Neupogen. Per MD note prognosis is poor and patient will likely be transferred to [facility].  Goal of Therapy:  Resolution of symptoms  Plan:  Will continue Zosyn 3.375g IV q8h EI and discuss with MD in rounds need for continued therapy.  [name] Fusi, PharmD, BCPS 11/14/2014 10:06 AM

## 2014-11-14 NOTE — Plan of Care (Signed)
Problem: Discharge Progression Outcomes Goal: Other Discharge Outcomes/Goals Outcome: Progressing Plan of Care Progress to Goal:  After receiving 2 units of platelets yesterday, platelets are still low at 17.  Hgb stable at 7.5, WBC 0.9.  Pt Was to be d/ced to hospice today, but family and Dr. Janece Canterbury changed their minds.  Believes low blood levels due to chemo; will continue to evaluate and pt will stay Through Monday. No c/o of pain or nausea.  Experiencing some indigestion relieved by Maalox.  Poor PO intake.

## 2014-11-14 NOTE — Progress Notes (Signed)
Speech Therapy Note: spoke w/ pt who agreed he wanted to eat a regular diet. However, he did not want anything to eat at the moment. Since he is eating solids brought in this AM/last evening, SLP diet consistency was upgraded and will f/u at lunch meal. Dtr. on her phone but nodded in agreement.

## 2014-11-15 ENCOUNTER — Inpatient Hospital Stay: Payer: Medicare Other

## 2014-11-15 ENCOUNTER — Other Ambulatory Visit: Payer: Self-pay | Admitting: Hematology and Oncology

## 2014-11-15 DIAGNOSIS — J811 Chronic pulmonary edema: Secondary | ICD-10-CM

## 2014-11-15 DIAGNOSIS — D696 Thrombocytopenia, unspecified: Secondary | ICD-10-CM

## 2014-11-15 DIAGNOSIS — C8310 Mantle cell lymphoma, unspecified site: Secondary | ICD-10-CM

## 2014-11-15 LAB — CBC WITH DIFFERENTIAL/PLATELET
Basophils Absolute: 0 10*3/uL (ref 0–0.1)
Basophils Relative: 1 %
Eosinophils Absolute: 0 10*3/uL (ref 0–0.7)
Eosinophils Relative: 2 %
HCT: 22.1 % — ABNORMAL LOW (ref 40.0–52.0)
Hemoglobin: 7.3 g/dL — ABNORMAL LOW (ref 13.0–18.0)
Lymphocytes Relative: 57 %
Lymphs Abs: 0.6 10*3/uL — ABNORMAL LOW (ref 1.0–3.6)
MCH: 27.4 pg (ref 26.0–34.0)
MCHC: 33.3 g/dL (ref 32.0–36.0)
MCV: 82.4 fL (ref 80.0–100.0)
Monocytes Absolute: 0.1 10*3/uL — ABNORMAL LOW (ref 0.2–1.0)
Monocytes Relative: 10 %
Neutro Abs: 0.3 10*3/uL — ABNORMAL LOW (ref 1.4–6.5)
Neutrophils Relative %: 30 %
Platelets: 11 10*3/uL — CL (ref 150–440)
RBC: 2.68 MIL/uL — ABNORMAL LOW (ref 4.40–5.90)
RDW: 18.7 % — ABNORMAL HIGH (ref 11.5–14.5)
WBC: 1.1 10*3/uL — CL (ref 3.8–10.6)

## 2014-11-15 LAB — APTT: aPTT: 23 seconds — ABNORMAL LOW (ref 24–36)

## 2014-11-15 LAB — BASIC METABOLIC PANEL
ANION GAP: 7 (ref 5–15)
BUN: 30 mg/dL — ABNORMAL HIGH (ref 6–20)
CALCIUM: 8.2 mg/dL — AB (ref 8.9–10.3)
CHLORIDE: 115 mmol/L — AB (ref 101–111)
CO2: 19 mmol/L — ABNORMAL LOW (ref 22–32)
Creatinine, Ser: 1.2 mg/dL (ref 0.61–1.24)
GFR calc Af Amer: >60 mL/min (ref >60)
GFR calc non Af Amer: >60 mL/min (ref >60)
Glucose, Bld: 135 mg/dL — ABNORMAL HIGH (ref 65–99)
POTASSIUM: 3.5 mmol/L (ref 3.5–5.1)
SODIUM: 141 mmol/L (ref 135–145)

## 2014-11-15 LAB — PROTIME-INR
INR: 1.11
Prothrombin Time: 14.5 seconds (ref 11.4–15.0)

## 2014-11-15 LAB — FIBRINOGEN: Fibrinogen: 472 mg/dL — ABNORMAL HIGH (ref 210–470)

## 2014-11-15 MED ORDER — DOCUSATE SODIUM 100 MG PO CAPS
100.0000 mg | ORAL_CAPSULE | Freq: Two times a day (BID) | ORAL | Status: DC
  Administered 2014-11-15 – 2014-11-19 (×10): 100 mg via ORAL
  Filled 2014-11-15 (×10): qty 1

## 2014-11-15 MED ORDER — FUROSEMIDE 10 MG/ML IJ SOLN
40.0000 mg | Freq: Once | INTRAMUSCULAR | Status: AC
  Administered 2014-11-15: 40 mg via INTRAVENOUS
  Filled 2014-11-15: qty 4

## 2014-11-15 NOTE — Plan of Care (Signed)
Problem: Discharge Progression Outcomes Goal: Discharge plan in place and appropriate Individualization  Outcome: Progressing PMH: Arthritis, HTN, RA, Anemia, CKD, and Lymphoma (Diagnosed Sept. 2014).       High Fall Risk - safety checks with hourly rounding.         Goal: Other Discharge Outcomes/Goals Outcome: Progressing Plan of Care Progressing to Goal: Patient has had no complaints of pain or nausea. Tolerating diet. Family at bedside.

## 2014-11-15 NOTE — Progress Notes (Signed)
Per Dr Tressia Miners, patient does not need to be on neutropenic precautions because of condition.

## 2014-11-15 NOTE — Consult Note (Signed)
Palliative Medicine Inpatient Consult Follow Up Note   Name: Caleb Francis Date: 11/15/2014 MRN: [id number]  DOB: 10/21/1945  Referring Physician: Gladstone Lighter, MD  Palliative Care consult requested for this 69 y.o. 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 Mr Mehringer is lying in bed. Awake, answers questions appropriately. Denies shortness of breath at present. Daughter at bedside.  REVIEW OF SYSTEMS:  Pain: None Dyspnea:  No Nausea/Vomiting:  anorexia Diarrhea:  No Constipation:   No Depression:   No Anxiety:   No Fatigue:   Yes  CODE STATUS: DNR   PAST MEDICAL HISTORY: Past Medical History  Diagnosis Date  . Arthritis   . Hypertension   . RA (rheumatoid arthritis)   . Anemia   . Cancer     lymphoma  . History of nuclear stress test     a. 12/2013: low risk, no sig ischemia, no EKG changes, no artifact, EF 63%  . Chronic kidney disease     PAST SURGICAL HISTORY:  Past Surgical History  Procedure Laterality Date  . Back surgery    . Fracture surgery      ankle  . Portacath placement    . Cystoscopy w/ ureteral stent placement Left 10/24/2014    Procedure: CYSTOSCOPY WITH RETROGRADE PYELOGRAM/URETERAL STENT PLACEMENT;  Surgeon: Irine Seal, MD;  Location: [facility] ORS;  Service: Urology;  Laterality: Left;    Vital Signs: BP 123/67 mmHg  Pulse 107  Temp(Src) 98 F (36.7 C) (Oral)  Resp 20  Ht 6\' 1"  (1.854 m)  Wt 78.245 kg (172 lb 8 oz)  BMI 22.76 kg/m2  SpO2 96% Filed Weights   11/13/14 0521 11/14/14 0609 11/15/14 0500  Weight: 76.295 kg (168 lb 3.2 oz) 78.472 kg (173 lb) 78.245 kg (172 lb 8 oz)    Estimated body mass index is 22.76 kg/(m^2) as calculated from the following:   Height as of this encounter: 6\' 1"  (1.854 m).   Weight as of this encounter: 78.245 kg (172 lb 8 oz).  PHYSICAL EXAM: General: ill-appearing HEENT: OP clear Neck:  Trachea midline  Cardiovascular: regular rhythm Pulmonary/Chest: coarse BS's ant fields Abdominal: distended, nontender, hypoactive bowel sounds GU: No SP tenderness Extremities: 2+ edema Neurological: Grossly nonfocal Skin: no rashes Psychiatric: alert and oriented  LABS: CBC:    Component Value Date/Time   WBC 1.1* 11/15/2014 0446   WBC 6.4 07/17/2014 1121   HGB 7.3* 11/15/2014 0446   HGB 11.8* 07/17/2014 1121   HCT 22.1* 11/15/2014 0446   HCT 35.0* 07/17/2014 1121   PLT 11* 11/15/2014 0446   PLT 263 07/17/2014 1121   MCV 82.4 11/15/2014 0446   MCV 83 07/17/2014 1121   NEUTROABS 0.3* 11/15/2014 0446   NEUTROABS 3.4 07/17/2014 1121   LYMPHSABS 0.6* 11/15/2014 0446   LYMPHSABS 1.9 07/17/2014 1121   MONOABS 0.1* 11/15/2014 0446   MONOABS 0.8 07/17/2014 1121   EOSABS 0.0 11/15/2014 0446   EOSABS 0.2 07/17/2014 1121   BASOSABS 0.0 11/15/2014 0446   BASOSABS 0.0 07/17/2014 1121   Comprehensive Metabolic Panel:    Component Value Date/Time   NA 141 11/15/2014 0446   NA 141 07/17/2014 1121   K 3.5 11/15/2014 0446   K 2.8* 07/17/2014 1121   CL 115* 11/15/2014 0446   CL 100 07/17/2014 1121   CO2 19* 11/15/2014 0446   CO2 32 07/17/2014 1121   BUN 30* 11/15/2014  0446   BUN 9 07/17/2014 1121   CREATININE 1.20 11/15/2014 0446   CREATININE 1.09 07/17/2014 1121   GLUCOSE 135* 11/15/2014 0446   GLUCOSE 105* 07/17/2014 1121   CALCIUM 8.2* 11/15/2014 0446   CALCIUM 8.9 07/17/2014 1121   AST 38 11/08/2014 2221   AST 17 07/17/2014 1121   ALT 29 11/08/2014 2221   ALT 12* 07/17/2014 1121   ALKPHOS 194* 11/08/2014 2221   ALKPHOS 79 07/17/2014 1121   BILITOT 1.0 11/08/2014 2221   PROT 6.6 11/08/2014 2221   PROT 7.3 07/17/2014 1121   ALBUMIN 2.6* 11/08/2014 2221   ALBUMIN 3.1* 07/17/2014 1121    IMPRESSION: Mr Tabet is a 69 yo man with PMH of mantle cell lymphoma dx 02/2013 on salvage chemo, HTN, RA, 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 He was admitted  11/08/14 with abd pain, fever, altered mental status. He was tx'd for presumed sepsis. He also has A/CKD. Hospital course further complicated by pancytopenia for which he is receiving RBC & platelet transfusions and G-CSF.   Pt had episode of shortness of breath last PM. CXR c/w pulmonary edema though pt negative 15L since admission.  Given lasix with relief of symptoms.   I once again discussed d/c plan with pt and his daughter. Also d/w CM who confirms that SNF with plan of returning to cancer center for transfusions is not an option. Pt would like to continue blood support over the week-end to see if it "gives me a little more time". Will transfer to [facility] on Monday.   PLAN: Hospice Home on Monday  REFERRALS TO BE ORDERED:  Hospice   More than 50% of the visit was spent in counseling/coordination of care: YES  Time spent: 35 minutes

## 2014-11-15 NOTE — Progress Notes (Signed)
Spoke with Dr. Marcille Blanco about pt's xray results.  MD to put in Lasix order and would like IV fluids to be held.  Clarise Cruz, RN

## 2014-11-15 NOTE — Progress Notes (Signed)
[facility] Hematology/Oncology Progress Note  Date of admission: 11/08/2014  Hospital day:  11/14/2014  Chief Complaint: Caleb Francis is a 69 y.o. male mantle cell lymphoma admitted with abdominal pain and findings concerning for sepsis.  Subjective: The patient notes intermittent pain.  He denies any bruising or bleeding.  He notes a transfusion reaction with lip and eyelid edema last night.    Social History: The patient is accompanied by his daughter and grand daughter.  Allergies: No Known Allergies  Scheduled Medications: . allopurinol  300 mg Oral Daily  . antiseptic oral rinse  7 mL Mouth Rinse q12n4p  . chlorhexidine  15 mL Mouth Rinse BID  . feeding supplement (ENSURE ENLIVE)  237 mL Oral BID BM  . filgrastim  480 mcg Subcutaneous Daily  . metoprolol tartrate  25 mg Oral BID  . piperacillin-tazobactam (ZOSYN)  IV  3.375 g Intravenous 3 times per day  . potassium chloride  40 mEq Oral BID  . predniSONE  50 mg Oral Q breakfast  . senna-docusate  2 tablet Oral BID   Review of Systems: GENERAL: Feels about the same. General fatigue. Pain controlled. No fevers or sweats. PERFORMANCE STATUS (ECOG): 3 HEENT: No visual changes, runny nose, sore throat, mouth sores or tenderness. Lungs: No shortness of breath or cough. No hemoptysis. Cardiac: No chest pain, palpitations, orthopnea, or PND. GI: Abdominal pain and discomfort episodic. No nausea, vomiting, diarrhea, constipation, melena or hematochezia. GU: Voiding well. No urgency, frequency, dysuria, or hematuria. Musculoskeletal: No back pain. No joint pain. No muscle tenderness. Extremities: Left leg swelling (chronic). Skin: No rashes or skin changes. Neuro: No headache, numbness or weakness, balance or coordination issues. Endocrine: No diabetes, thyroid issues, hot flashes or night sweats. Psych: No mood changes, depression or anxiety. Pain: No focal pain. Review of  systems: All other systems reviewed and found to be negative.  Physical Exam: Blood pressure 122/68, pulse 97, temperature 98.2 F (36.8 C), temperature source Oral, resp. rate 18, height 6' 1"  (1.854 m), weight 173 lb (78.472 kg), SpO2 97 %.  GENERAL: Chronically ill appearing gentleman sitting in bed in no acute distress.  He is tearful at times. MENTAL STATUS: Alert and oriented to person, place and time. HEAD: Normocephalic, atraumatic, face symmetric, no Cushingoid features. EYES: Brown eyes.No eyelid edema.  No conjunctivitis or scleral icterus. ENT: No lip edema. Tongue normal.  ABDOMEN: Distended. Periodically holds abdomen. SKIN: No rashes, ulcers or lesions. EXTREMITIES: Left lower extremity edema.  NEUROLOGICAL: Unremarkable. PSYCH: Appropriate.  Results for orders placed or performed during the hospital encounter of 11/08/14 (from the past 48 hour(s))  Basic metabolic panel     Status: Abnormal   Collection Time: 11/13/14  8:47 AM  Result Value Ref Range   Sodium 139 135 - 145 mmol/L   Potassium 2.5 (LL) 3.5 - 5.1 mmol/L    Comment: CRITICAL RESULT CALLED TO, READ BACK BY AND VERIFIED WITH MARY WITHERSPOON AT 0932 ON 11/13/14 BY QSD    Chloride 111 101 - 111 mmol/L   CO2 21 (L) 22 - 32 mmol/L   Glucose, Bld 98 65 - 99 mg/dL   BUN 32 (H) 6 - 20 mg/dL   Creatinine, Ser 1.32 (H) 0.61 - 1.24 mg/dL   Calcium 8.3 (L) 8.9 - 10.3 mg/dL   GFR calc non Af Amer 54 (L) >60 mL/min   GFR calc Af Amer >60 >60 mL/min    Comment: (NOTE) The eGFR has been calculated using the  CKD EPI equation. This calculation has not been validated in all clinical situations. eGFR's persistently <60 mL/min signify possible Chronic Kidney Disease.    Anion gap 7 5 - 15  Uric acid     Status: Abnormal   Collection Time: 11/13/14  8:47 AM  Result Value Ref Range   Uric Acid, Serum 3.8 (L) 4.4 - 7.6 mg/dL  CBC with Differential     Status: Abnormal   Collection Time: 11/13/14  8:47 AM   Result Value Ref Range   WBC 1.9 (L) 3.8 - 10.6 K/uL   RBC 2.76 (L) 4.40 - 5.90 MIL/uL   Hemoglobin 7.5 (L) 13.0 - 18.0 g/dL   HCT 22.4 (L) 40.0 - 52.0 %   MCV 81.0 80.0 - 100.0 fL   MCH 27.3 26.0 - 34.0 pg   MCHC 33.7 32.0 - 36.0 g/dL   RDW 18.6 (H) 11.5 - 14.5 %   Platelets 9 (LL) 150 - 440 K/uL    Comment: RESULT REPEATED AND VERIFIED CRITICAL VALUE NOTED.  VALUE IS CONSISTENT WITH PREVIOUSLY REPORTED AND CALLED VALUE.    Neutrophils Relative % 66 43 - 77 %   Lymphocytes Relative 21 12 - 46 %   Monocytes Relative 5 3 - 12 %   Eosinophils Relative 7 (H) 0 - 5 %   Basophils Relative 0 0 - 1 %   Band Neutrophils 1 0 - 10 %   Metamyelocytes Relative 0 %   Myelocytes 0 %   Promyelocytes Absolute 0 %   Blasts 0 %   nRBC 0 0 /100 WBC   Neutro Abs 1.3 (L) 1.7 - 7.7 K/uL   Lymphs Abs 0.4 (L) 0.7 - 4.0 K/uL   Monocytes Absolute 0.1 0.1 - 1.0 K/uL   Eosinophils Absolute 0.1 0.0 - 0.7 K/uL   Basophils Absolute 0.0 0.0 - 0.1 K/uL   RBC Morphology MARKED POIKILOCYTOSIS     Comment: MIXED RBC POPULATION   Smear Review HYPOCHROMIA   Prepare Pheresed Platelets     Status: None   Collection Time: 11/13/14 12:56 PM  Result Value Ref Range   Unit Number [id number]    Blood Component Type PLTP LI1 PAS    Unit division 00    Status of Unit ISSUED,FINAL    Transfusion Status OK TO TRANSFUSE    Unit Number [id number]    Blood Component Type PLTP LI1 PAS    Unit division 00    Status of Unit ISSUED,FINAL    Transfusion Status OK TO TRANSFUSE   Platelet count     Status: Abnormal   Collection Time: 11/13/14  7:34 PM  Result Value Ref Range   Platelets 16 (LL) 150 - 440 K/uL    Comment: RESULT REPEATED AND VERIFIED CRITICAL VALUE NOTED.  VALUE IS CONSISTENT WITH PREVIOUSLY REPORTED AND CALLED VALUE.   CBC with Differential     Status: Abnormal   Collection Time: 11/14/14  5:21 AM  Result Value Ref Range   WBC 0.9 (LL) 3.8 - 10.6 K/uL    Comment: CRITICAL RESULT CALLED TO, READ  BACK BY AND VERIFIED WITH: MEAGAN OAKLEY @ 0621 6.2.16 MPG    RBC 2.74 (L) 4.40 - 5.90 MIL/uL   Hemoglobin 7.5 (L) 13.0 - 18.0 g/dL   HCT 22.4 (L) 40.0 - 52.0 %   MCV 81.5 80.0 - 100.0 fL   MCH 27.3 26.0 - 34.0 pg   MCHC 33.5 32.0 - 36.0 g/dL   RDW 18.7 (H) 11.5 - 14.5 %  Platelets 17 (LL) 150 - 440 K/uL    Comment: CRITICAL RESULT CALLED TO, READ BACK BY AND VERIFIED WITH: MEAGAN OAKLEY @ 0017 6.2.16 MPG    Neutrophils Relative % 68% %   Neutro Abs 0.6 (L) 1.4 - 6.5 K/uL   Lymphocytes Relative 25% %   Lymphs Abs 0.2 (L) 1.0 - 3.6 K/uL   Monocytes Relative 6% %   Monocytes Absolute 0.1 (L) 0.2 - 1.0 K/uL   Eosinophils Relative 0% %   Eosinophils Absolute 0.0 0 - 0.7 K/uL   Basophils Relative 1% %   Basophils Absolute 0.0 0 - 0.1 K/uL   No results found.  Assessment:  Caleb Francis is a 69 y.o. male with rapidly progressive mantle cell lymphoma currently day 8 status post cycle #1 RICE chemotherapy.   Plan: 1. Hematology/Oncology:  I spoke with the patient at length regarding his current clinical status. He unfortunately has progressive lymphoma with no other treatment options. As he is early on post chemotherapy his counts are low. He has required both packed red blood cell and platelet transfusion support on a regular basis. He is also on G-CSF. We discussed his thoughts about going to the hospice home as his wife is unable to care for him at home. Transfusion support is not available through hospice. At this time, he is requiring almost daily support. It is unclear if his counts will recover given his marrow involvement and circulating lymphoma.  The feasibility of a transfer to a skilled nursing facility to allow transfusion support in the outpatient setting in the infusion center was discussed with his daughter in a separate meeting following the conversation with the patient. This option is currently being investigated.  He will be at his nadir counts up to day 14. There is  a family event with a granddaughter graduating from college at the end of next week. It is unclear even with support because of his aggressive disease if he will able to make that event. Without transfusion support the likelihood of making that event is extremely small. Given that his extremely low counts are currently due to his chemotherapy we will try to support him through his nadir.  Additional labs ordered in the morning.  We will try to keep his platelet count > 10k if he is not febrile (> 20k if febrile or bleeding) and hemoglobin > 7.0. Daily GCSF will continue until [place] >5000. 2. Infectious Disease: Cultures negative. Patient remains on Zosyn. 3. Fluids/electrolytes/Nutrition: IVF continue. Creatinine stable. Patient with a history of chronic hypokalemia. Patient on allopurinol for tumor lysis precautions. Left sided hydronephrosis s/p stent placement, now with right sided hydronephrosis. No plan for right sided stent placement.  4. Pain/Toxicology: Pain controlled.  Plan to transition to oral outpatient regimen. 5. Disposition: Patient unable to go home.  Long term plan is Hospice Home.  Social work assessing feasibility of nursing home placement. Code status DNR.  Greater than 40 minutes spent today with patient and family as well as coordinating care with Dr. Ermalinda Memos and Dr. Tressia Miners.  Lequita Asal, MD  11/14/2014, 6:35 PM

## 2014-11-15 NOTE — Plan of Care (Signed)
Problem: Discharge Progression Outcomes Goal: Other Discharge Outcomes/Goals Outcome: Progressing Plan of care progress to goal for: Pain-pt c/o pain in chest when breathing in, prn meds given with improvement Hemodynamically-VSS Complications-pt feeling increasely SOB and wheezing, prn neb treatment given with some improvement, chest xray showed pulmonary edema, IV lasix given and fluids held per MD Diet-pt tolerating diet Activity-pt is on bedrest

## 2014-11-15 NOTE — Care Management (Signed)
Dr. Alvester Morin spoke with the family of Mr. Hurwitz.  The family is in agreement that Mr. Paternoster would be treated over the weekend, if no improvement by Monday, will be transferred to the Hospice Home. Shelbie Ammons RN MSN Care Management [phone]

## 2014-11-15 NOTE — Progress Notes (Signed)
Spoke with Dr. Lavetta Nielsen about pt new complaint of midline chest pain when he breaths in.  Vitals are stable at this time.  MD to order EKG.  Clarise Cruz, RN

## 2014-11-15 NOTE — Progress Notes (Signed)
[facility] [place] NAME: Caleb Francis    MR#:  [id number]  DATE OF BIRTH:  11-13-1945  SUBJECTIVE:  CHIEF COMPLAINT:   Chief Complaint  Patient presents with  . Extremity Pain  - alert, and interactive, denies any abdominal pain. - had an episode of dyspnea, resp distress last night- CXR with pulm vascular congestion- improved with lasix. - pancytopenia- but above transfusion threshold. - uncle at bedside  REVIEW OF SYSTEMS:  ROS  Constitutional: Negative for fever and chills.  Respiratory: Negative for cough, and wheezing. mild shortness of breath noted. Cardiovascular: Negative for chest pain and palpitations.  Gastrointestinal: Negative for nausea, vomiting, abdominal pain, diarrhea and constipation.  Minimal abdominal discomfort Genitourinary: Negative for dysuria.  Neurological: Negative for dizziness, seizures and headaches.  DRUG ALLERGIES:  No Known Allergies  VITALS:  Blood pressure 123/67, pulse 107, temperature 98 F (36.7 C), temperature source Oral, resp. rate 20, height 6\' 1"  (1.854 m), weight 78.245 kg (172 lb 8 oz), SpO2 96 %.  PHYSICAL EXAMINATION:  Physical Exam  GENERAL:  69 y.o.-year-old patient lying in the bed, distress secondary to pain all over. Confused, ill appearing  EYES: Pupils equal, round, reactive to light and accommodation. No scleral icterus. Extraocular muscles intact.  HEENT: Head atraumatic, normocephalic. Oropharynx and nasopharynx clear. Extremely dry mucous membranes. NECK:  Supple, no jugular venous distention. No thyroid enlargement, no tenderness.  LUNGS: Normal breath sounds bilaterally, no wheezing, rales, rhonchi or crepitation. No use of accessory muscles of respiration.  CARDIOVASCULAR: S1, S2 normal. No murmurs, rubs, or gallops.  ABDOMEN: Distended, generalized tenderness.  Bowel sounds present. No organomegaly or mass.  EXTREMITIES: Left leg 1+ edema, right leg no  edema NEUROLOGIC: Cranial nerves II through XII are intact. Generalized weakness present. No focal weakness noted. Sensation is intact. Gait not checked.  Not following commands PSYCHIATRIC:  Patient is more alert, and oriented x 2.  SKIN: No obvious rash, lesion, or ulcer.    LABORATORY PANEL:   CBC  Recent Labs Lab 11/15/14 0446  WBC 1.1*  HGB 7.3*  HCT 22.1*  PLT 11*   ------------------------------------------------------------------------------------------------------------------  Chemistries   Recent Labs Lab 11/08/14 2221 11/08/14 2247  11/15/14 0446  NA 141  --   < > 141  K 5.0  --   < > 3.5  CL 107  --   < > 115*  CO2 23  --   < > 19*  GLUCOSE 76  --   < > 135*  BUN 22*  --   < > 30*  CREATININE 1.12  --   < > 1.20  CALCIUM 9.1  --   < > 8.2*  MG  --  2.2  --   --   AST 38  --   --   --   ALT 29  --   --   --   ALKPHOS 194*  --   --   --   BILITOT 1.0  --   --   --   < > = values in this interval not displayed. ------------------------------------------------------------------------------------------------------------------  Cardiac Enzymes  Recent Labs Lab 11/08/14 2221  TROPONINI 0.07*   ------------------------------------------------------------------------------------------------------------------  RADIOLOGY:  Dg Chest Port 1 View  11/15/2014   CLINICAL DATA:  Shortness of breath and cough.  Weakness.  EXAM: PORTABLE CHEST - 1 VIEW  COMPARISON:  11/08/2014  FINDINGS: Tip of the right chest port remains in the SVC. The heart is  at the upper limits normal in size. There are increased perihilar markings, progressed from prior exam. Increased right basilar atelectasis, now with mild left basilar atelectasis. No definite pleural effusion or pneumothorax.  IMPRESSION: Increased perihilar markings concerning pulmonary edema. Increased right basilar atelectasis in development of mild left basilar atelectasis.   Electronically Signed   By: Jeb Levering  M.D.   On: 11/15/2014 04:43    EKG:   Orders placed or performed during the hospital encounter of 11/08/14  . EKG 12-Lead  . EKG 12-Lead  . EKG 12-Lead  . EKG 12-Lead  . EKG 12-Lead  . EKG 12-Lead    ASSESSMENT AND PLAN:   69 year old African-American male with past medical history significant for mantle cell lymphoma with recurrence, started on chemotherapy 3 days ago, recent admission for left-sided hydronephrosis status post ureteral stent placement last week, comes to the hospital secondary to worsening of abdominal pain and fevers.  #1 sepsis-Likely intraabdominal. Blood cultures are negative at this time..  - Continue Zosyn at this time. Will need ABX for 10 days. Discontinue on 11/18/14. No further fevers - discontinue vanc - blood cultures are negative. - Urology has been consulted. Blood pressure is stable. Has not required any pressors.  #2 acute renal failure-secondary to obstructive uropathy, bilateral hydronephrosis. Secondary to worsening retroperitoneal lymphadenopathy. Appreciate urology consult. - Patient already had left-sided stent placement 10 days ago. -Continue to monitor renal function, IV fluids at this time.  #3 Hypokalemia- replaced, labs from today ordered - on standing orders for 2 days due to chronic hypokalemia, monitor  #4 mantle cell lymphoma-diagnosed in September 2014. Now CT with rapid progression of the lymphadenopathy - on new chemotherapy which was just started. Appreciate oncology consult. poor prognosis per oncology Wife at bedside - Patient is DNR  #5 anemia and Thrombocytopenia- - likely from lymphoma itself  - hb stable around 7, after 2units TX - received total 3units plt tx in the last 2 days- plts this am 11k - ? Reaction to platelets with eyelids swelling- improving with IV steroids- change to PO prednisone and discontinue tomorrow - Transfuse PRBC if Hb<7, Transfuse platelets if Plt count <10k - Leukopenia- neupogen started by  oncologist, likely from recent chemo - monitor  #6 . Gout-restarted allopurinol especially with his recent chemotherapy, to help with tumor lysis syndrome.  #7 DVT prophylaxis- subcutaneous heparin.  #6 hypertension-BP is better now, on metoprolol   Overall poor prognosis. Monitor over the weekend and treat supportively. Possible discharge to Hospice home on Monday  All the records are reviewed and case discussed with Care Management/Social Workerr. Management plans discussed with the patient, family and they are in agreement.  CODE STATUS: DNR  TOTAL TIME SPENT IN TAKING CARE OF THIS PATIENT: 40 minutes.   Latika Kronick M.D on 11/15/2014 at 12:48 PM  Between 7am to 6pm - Pager - [phone]  After 6pm go to [email or web address] - password [email or web address] [email or web address]  [facility]  Office  [phone]  CC: Primary care physician; No PCP Per Patient

## 2014-11-15 NOTE — Progress Notes (Signed)
Blood Transfusion Reaction Investigation  Transfusion Reaction Investigation:   Patient's Clinical History:  69 yo male with progressive mantle cell lymphoma, on chemotherapy who has received platelet transfusions in the past. On 11/13/14 after all but a reported 75 mls of platelets had been transfused, he began to show lip and eye swelling. The transfusion was discontinued and the treating clinician notified.  The patient was given steroids with eventual resolution of symptoms.  The patient was successfully transfused platelets on the next day after pre-medication without adverse effects.   Interpretation of Reaction:  Possible allergic  Comments/Recommendations:  Pre-medicate with benadryl or steroids as clinically indicated prior to any future platelet transfusions.  Physician Contacted:  Dr. Tressia Miners on 11/14/14.  Blood Transfusion Reaction (Nursing Documentation):   Suspected Transfusion Reaction  Blood Bank Notified?: Yes  Physician Notified?: Yes  Reaction Symptoms: Other (Comment) (lips and eyes are swollen)  Reaction Interventions: Transfusion stopped, Saline hung with new IV tubing  Time Reaction Noted: 1839  Time Infusion Interrupted: 1841  Volume Received Prior to Suspected Reaction:  (250)  Unit Number: [id number]  Labs   Group/Rh:  O Positive   Sample was sent back to the Path lab and cultured.   Lum Babe, MD 11/15/2014 10:38 AM

## 2014-11-15 NOTE — Progress Notes (Signed)
Spoke with Dr. Marcille Blanco about pt's continued SOB and wheezing.  Read EKG results to MD as well.  Pt has increased wheezing and rhonchi on left side on ausculation. [name] o2 sats are 98% on RA.  MD to order chest xray.  Clarise Cruz, RN

## 2014-11-16 DIAGNOSIS — R5081 Fever presenting with conditions classified elsewhere: Secondary | ICD-10-CM

## 2014-11-16 DIAGNOSIS — Z79899 Other long term (current) drug therapy: Secondary | ICD-10-CM

## 2014-11-16 DIAGNOSIS — T451X5A Adverse effect of antineoplastic and immunosuppressive drugs, initial encounter: Secondary | ICD-10-CM

## 2014-11-16 DIAGNOSIS — D701 Agranulocytosis secondary to cancer chemotherapy: Secondary | ICD-10-CM

## 2014-11-16 DIAGNOSIS — D61818 Other pancytopenia: Secondary | ICD-10-CM

## 2014-11-16 LAB — CBC WITH DIFFERENTIAL/PLATELET
Basophils Absolute: 0 10*3/uL (ref 0–0.1)
Basophils Relative: 1 %
Eosinophils Absolute: 0 10*3/uL (ref 0–0.7)
Eosinophils Relative: 4 %
HCT: 20.2 % — ABNORMAL LOW (ref 40.0–52.0)
Hemoglobin: 6.7 g/dL — ABNORMAL LOW (ref 13.0–18.0)
Lymphocytes Relative: 54 %
Lymphs Abs: 0.7 10*3/uL — ABNORMAL LOW (ref 1.0–3.6)
MCH: 27.1 pg (ref 26.0–34.0)
MCHC: 33.3 g/dL (ref 32.0–36.0)
MCV: 81.5 fL (ref 80.0–100.0)
Monocytes Absolute: 0.4 10*3/uL (ref 0.2–1.0)
Monocytes Relative: 34 %
Neutro Abs: 0.1 10*3/uL — ABNORMAL LOW (ref 1.4–6.5)
Neutrophils Relative %: 7 %
Platelets: 11 10*3/uL — CL (ref 150–440)
RBC: 2.48 MIL/uL — ABNORMAL LOW (ref 4.40–5.90)
RDW: 18.4 % — ABNORMAL HIGH (ref 11.5–14.5)
WBC: 1.2 10*3/uL — CL (ref 3.8–10.6)

## 2014-11-16 LAB — PREPARE RBC (CROSSMATCH)

## 2014-11-16 LAB — BASIC METABOLIC PANEL
Anion gap: 7 (ref 5–15)
BUN: 28 mg/dL — ABNORMAL HIGH (ref 6–20)
CHLORIDE: 111 mmol/L (ref 101–111)
CO2: 23 mmol/L (ref 22–32)
CREATININE: 1.06 mg/dL (ref 0.61–1.24)
Calcium: 8.2 mg/dL — ABNORMAL LOW (ref 8.9–10.3)
GFR calc Af Amer: >60 mL/min (ref >60)
GFR calc non Af Amer: >60 mL/min (ref >60)
Glucose, Bld: 104 mg/dL — ABNORMAL HIGH (ref 65–99)
POTASSIUM: 3 mmol/L — AB (ref 3.5–5.1)
Sodium: 141 mmol/L (ref 135–145)

## 2014-11-16 MED ORDER — SODIUM CHLORIDE 0.9 % IJ SOLN: 3.0000 mL | INTRAMUSCULAR | Status: DC | PRN

## 2014-11-16 MED ORDER — DIPHENHYDRAMINE HCL 25 MG PO CAPS
25.0000 mg | ORAL_CAPSULE | Freq: Once | ORAL | Status: AC
  Administered 2014-11-16: 25 mg via ORAL
  Filled 2014-11-16: qty 1

## 2014-11-16 MED ORDER — HEPARIN SOD (PORK) LOCK FLUSH 100 UNIT/ML IV SOLN: 500.0000 [IU] | Freq: Every day | INTRAVENOUS | Status: DC | PRN

## 2014-11-16 MED ORDER — SODIUM CHLORIDE 0.9 % IJ SOLN
10.0000 mL | INTRAMUSCULAR | Status: AC | PRN
  Administered 2014-11-17: 10 mL

## 2014-11-16 MED ORDER — SODIUM CHLORIDE 0.9 % IV SOLN
Freq: Once | INTRAVENOUS | Status: AC
Start: 2014-11-16 — End: 2014-11-16
  Administered 2014-11-16: 17:00:00 via INTRAVENOUS

## 2014-11-16 MED ORDER — ACETAMINOPHEN 325 MG PO TABS
650.0000 mg | ORAL_TABLET | Freq: Once | ORAL | Status: AC
  Administered 2014-11-16: 13:00:00 650 mg via ORAL
  Filled 2014-11-16: qty 2

## 2014-11-16 MED ORDER — HEPARIN SOD (PORK) LOCK FLUSH 100 UNIT/ML IV SOLN: 250.0000 [IU] | INTRAVENOUS | Status: DC | PRN

## 2014-11-16 MED ORDER — SODIUM CHLORIDE 0.9 % IV SOLN
250.0000 mL | Freq: Once | INTRAVENOUS | Status: AC
  Administered 2014-11-16: 13:00:00 250 mL via INTRAVENOUS

## 2014-11-16 NOTE — Consult Note (Signed)
ONCOLOGY follow-up note -  HPI:  overall generalized weakness same. Denies nausea or vomiting. ROS: denies fevers. Eating better today. Exam: resting in bed, weak, otherwise alert and oriented, no acute distress.             Vitals-98.3, 106, 18, 127/68, 100             Lungs-bilateral good air entry, decreased at bases             Abdomen-distended, nontender  Labs: creatinine 1.06, calcium 8.2, WBC 1200, ANC 0.1, hemoglobin 6.7, platelets 11.  Impression/Recommendations: 69 y.o. male with rapidly progressive mantle cell lymphoma currently day 10 status post cycle #1 RICE chemotherapy - overall continues to have generalized weakness. Otherwise no abdominal pain or other pain issues, no nausea or vomiting. He is trying to eat better. CBC continues to show pancytopenia, still significantly neutropenic. Continue neutropenic precautions, ongoing supportive treatment. Continue GCSF until [place] > 5000 to ensure when discontinued that [place] will remain adequate.Continue transfusion support as indicated (maintain hemoglobin > 7 and platelets > 10,000 if not febrile and > 20,000 if febrile or bleeding). Oncology will continue to follow as indicated.

## 2014-11-16 NOTE — Plan of Care (Signed)
Problem: Discharge Progression Outcomes Goal: Discharge plan in place and appropriate Individualization  Outcome: Progressing PMH: Arthritis, HTN, RA, Anemia, CKD, and Lymphoma (Diagnosed Sept. 2014).        High Fall Risk - safety checks with hourly rounding.       Goal: Other Discharge Outcomes/Goals Outcome: Progressing Plan of care progress to goal: Pain: No c/o pain. Hemodynamically stable: VSS this shift. Pt had platelets this week. Pt still with low platelet count. No further aggressive treatment has been ordered.  Complications resolved: Pt going to either snf v/s hospice which will be determined over the weekend.  Tolerating diet: Pt tolerating diet. Activity appropriated for discharge: Pt at his baseline.

## 2014-11-16 NOTE — Progress Notes (Signed)
[facility] Hematology/Oncology Progress Note  Date of admission: 11/08/2014  Hospital day:  11/15/2014  Chief Complaint: Caleb Francis is a 69 y.o. male with mantle cell lymphoma admitted with abdominal pain and findings concerning for sepsis  Subjective: The patient denies any pain.  He denies any bruising or bleeding.  Social History: The patient is accompanied by by his uncle.  Allergies: No Known Allergies  Scheduled Medications: . allopurinol  300 mg Oral Daily  . antiseptic oral rinse  7 mL Mouth Rinse q12n4p  . chlorhexidine  15 mL Mouth Rinse BID  . docusate sodium  100 mg Oral BID  . feeding supplement (ENSURE ENLIVE)  237 mL Oral BID BM  . filgrastim  480 mcg Subcutaneous Daily  . metoprolol tartrate  25 mg Oral BID  . piperacillin-tazobactam (ZOSYN)  IV  3.375 g Intravenous 3 times per day  . potassium chloride  40 mEq Oral BID  . predniSONE  50 mg Oral Q breakfast  . senna-docusate  2 tablet Oral BID   Review of Systems: GENERAL: Feels "ok". General fatigue. Pain well controlled. No fevers or sweats. PERFORMANCE STATUS (ECOG): 3 HEENT: No visual changes, runny nose, sore throat, mouth sores or tenderness. Lungs: No shortness of breath or cough. No hemoptysis. Cardiac: No chest pain, palpitations, orthopnea, or PND. GI: No nausea, vomiting, diarrhea, constipation, melena or hematochezia. GU: Voiding well. No urgency, frequency, dysuria, or hematuria. Musculoskeletal: No back pain. No joint pain. No muscle tenderness. Extremities: Left leg swelling (chronic, no change). Skin: No rashes or skin changes. Neuro: No headache, numbness or weakness, balance or coordination issues. Endocrine: No diabetes, thyroid issues, hot flashes or night sweats. Psych: No mood changes, depression or anxiety. Pain: No focal pain. Review of systems: All other systems reviewed and found to be negative.  Physical Exam: Blood pressure  133/75, pulse 108, temperature 98.3 F (36.8 C), temperature source Oral, resp. rate 18, height _0  (1.854 m), weight 164 lb 11.2 oz (74.707 kg), SpO2 100 %.  GENERAL:  Chronically ill appearing gentleman sitting comfortably on the medical unit watching TV in no acute distress. MENTAL STATUS:  Alert and oriented to person, place and time. HEAD:  Normocephalic, atraumatic, face symmetric, no Cushingoid features. EYES:  Brown eyes.  Pupils equal round and reactive to light and accomodation.  No conjunctivitis or scleral icterus. ENT:  Oropharynx clear without lesion.  Tongue normal. Mucous membranes moist.  RESPIRATORY:  Clear to auscultation without rales, wheezes or rhonchi. CARDIOVASCULAR:  Regular rate and rhythm without murmur, rub or gallop. ABDOMEN:  Distended.  Left sided fullness. Soft with active bowel sounds, and no hepatosplenomegaly.  SKIN:  No rashes, ulcers or lesions. EXTREMITIES: No edema, no skin discoloration or tenderness.  No palpable cords. LYMPH NODES: Fingertip cervical nodes.  Left axillary node 5 cm (slightly softer).  Left groin mass. NEUROLOGICAL: Unremarkable. PSYCH:  Appropriate.   Results for orders placed or performed during the hospital encounter of 11/08/14 (from the past 48 hour(s))  CBC with Differential     Status: Abnormal   Collection Time: 11/15/14  4:46 AM  Result Value Ref Range   WBC 1.1 (LL) 3.8 - 10.6 K/uL    Comment: CRITICAL VALUE NOTED.  VALUE IS CONSISTENT WITH PREVIOUSLY REPORTED AND CALLED VALUE. RESULT REPEATED AND VERIFIED WHITE COUNT CONFIRMED ON SMEAR    RBC 2.68 (L) 4.40 - 5.90 MIL/uL   Hemoglobin 7.3 (L) 13.0 - 18.0 g/dL   HCT 22.1 (L) 40.0 -  52.0 %   MCV 82.4 80.0 - 100.0 fL   MCH 27.4 26.0 - 34.0 pg   MCHC 33.3 32.0 - 36.0 g/dL   RDW 18.7 (H) 11.5 - 14.5 %   Platelets 11 (LL) 150 - 440 K/uL    Comment: CRITICAL VALUE NOTED.  VALUE IS CONSISTENT WITH PREVIOUSLY REPORTED AND CALLED VALUE. RESULT REPEATED AND VERIFIED PLATELET  COUNT CONFIRMED BY SMEAR    Neutrophils Relative % 30% %   Neutro Abs 0.3 (L) 1.4 - 6.5 K/uL   Lymphocytes Relative 57% %   Lymphs Abs 0.6 (L) 1.0 - 3.6 K/uL   Monocytes Relative 10% %   Monocytes Absolute 0.1 (L) 0.2 - 1.0 K/uL   Eosinophils Relative 2% %   Eosinophils Absolute 0.0 0 - 0.7 K/uL   Basophils Relative 1% %   Basophils Absolute 0.0 0 - 0.1 K/uL  Basic metabolic panel     Status: Abnormal   Collection Time: 11/15/14  4:46 AM  Result Value Ref Range   Sodium 141 135 - 145 mmol/L   Potassium 3.5 3.5 - 5.1 mmol/L   Chloride 115 (H) 101 - 111 mmol/L   CO2 19 (L) 22 - 32 mmol/L   Glucose, Bld 135 (H) 65 - 99 mg/dL   BUN 30 (H) 6 - 20 mg/dL   Creatinine, Ser 1.20 0.61 - 1.24 mg/dL   Calcium 8.2 (L) 8.9 - 10.3 mg/dL   GFR calc non Af Amer >60 >60 mL/min   GFR calc Af Amer >60 >60 mL/min    Comment: (NOTE) The eGFR has been calculated using the CKD EPI equation. This calculation has not been validated in all clinical situations. eGFR's persistently <60 mL/min signify possible Chronic Kidney Disease.    Anion gap 7 5 - 15  APTT     Status: Abnormal   Collection Time: 11/15/14  4:46 AM  Result Value Ref Range   aPTT 23 (L) 24 - 36 seconds  Fibrinogen     Status: Abnormal   Collection Time: 11/15/14  4:46 AM  Result Value Ref Range   Fibrinogen 472 (H) 210 - 470 mg/dL  Protime-INR     Status: None   Collection Time: 11/15/14  4:46 AM  Result Value Ref Range   Prothrombin Time 14.5 11.4 - 15.0 seconds   INR 1.11    Dg Chest Port 1 View  11/15/2014   CLINICAL DATA:  Shortness of breath and cough.  Weakness.  EXAM: PORTABLE CHEST - 1 VIEW  COMPARISON:  11/08/2014  FINDINGS: Tip of the right chest port remains in the SVC. The heart is at the upper limits normal in size. There are increased perihilar markings, progressed from prior exam. Increased right basilar atelectasis, now with mild left basilar atelectasis. No definite pleural effusion or pneumothorax.  IMPRESSION:  Increased perihilar markings concerning pulmonary edema. Increased right basilar atelectasis in development of mild left basilar atelectasis.   Electronically Signed   By: Jeb Levering M.D.   On: 11/15/2014 04:43   Assessment:  Caleb Francis is a 69 y.o. male with rapidly progressive mantle cell lymphoma currently day 10 status post cycle #1 RICE chemotherapy.   Plan: 1.  Hematology/Oncology:  Axillary lymph node slightly smaller and softer.  Unclear if mixed response to chemotherapy.  Imaging studies on admission with clear progressive disease, but early post chemotherapy.  Overall prognosis poor.  WBC appears responding to GCSF support with increasing ANC.  Continue GCSF until [place] > 5000 to  ensure when discontinued that [place] will remain adequate.  Platelet count borderline today.  Will transfuse platelets and get 1 hour post platelet count.  No evidence of DIC.  Suspect will need additional transfusion support over the weekend (maintain  hemoglobin > 7 and platelets > 10,000 if not febrile and > 20,000 if febrile or bleeding).  Current plan is for discharge to [facility] on Monday, 11/18/2014.  Lequita Asal, MD  11/15/2014, 8:56 PM

## 2014-11-16 NOTE — Progress Notes (Signed)
[place] at [place] NAME: Caleb Francis    MR#:  [id number]  DATE OF BIRTH:  [birth year]  SUBJECTIVE:  CHIEF COMPLAINT:   Chief Complaint  Patient presents with  . Extremity Pain  - alert, and interactive, denies any abdominal pain, chest pain or shortness of breath Family members are at bedside including wife, wants his wife to be medical POA  REVIEW OF SYSTEMS:  Review of Systems  Psychiatric/Behavioral: Negative for depression. The patient is not nervous/anxious.   Constitutional: Negative for fever and chills.  Respiratory: Negative for cough, and wheezing. mild shortness of breath with exertion  Cardiovascular: Negative for chest pain and palpitations.  Gastrointestinal: Negative for nausea, vomiting, abdominal pain, diarrhea and constipation.  Minimal abdominal discomfort Genitourinary: Negative for dysuria.  Neurological: Negative for dizziness, seizures and headaches.   DRUG ALLERGIES:  No Known Allergies  VITALS:  Blood pressure 127/68, pulse 106, temperature 98.3 F (36.8 C), temperature source Oral, resp. rate 18, height 6\' 1"  (1.854 m), weight 74.707 kg (164 lb 11.2 oz), SpO2 100 %.  PHYSICAL EXAMINATION:  Physical Exam  GENERAL:  69 y.o.-year-old patient lying in the bed, distress secondary to pain all over. Confused, ill appearing  EYES: Pupils equal, round, reactive to light and accommodation. No scleral icterus. Extraocular muscles intact.  HEENT: Head atraumatic, normocephalic. Oropharynx and nasopharynx clear. Extremely dry mucous membranes. NECK:  Supple, no jugular venous distention. No thyroid enlargement, no tenderness.  LUNGS: Normal breath sounds bilaterally, no wheezing, rales, rhonchi or crepitation. No use of accessory muscles of respiration.  CARDIOVASCULAR: S1, S2 normal. No murmurs, rubs, or gallops.  ABDOMEN: Distended, generalized tenderness.  Bowel sounds present. No organomegaly or mass.   EXTREMITIES: Left leg 1+ edema, right leg no edema NEUROLOGIC: Cranial nerves II through XII are intact. Generalized weakness present. No focal weakness noted. Sensation is intact. Gait not checked.  Not following commands PSYCHIATRIC:  Patient is more alert, and oriented x 2.  SKIN: No obvious rash, lesion, or ulcer.    LABORATORY PANEL:   CBC  Recent Labs Lab 11/16/14 0655  WBC 1.2*  HGB 6.7*  HCT 20.2*  PLT 11*   ------------------------------------------------------------------------------------------------------------------  Chemistries   Recent Labs Lab 11/16/14 0655  NA 141  K 3.0*  CL 111  CO2 23  GLUCOSE 104*  BUN 28*  CREATININE 1.06  CALCIUM 8.2*   ------------------------------------------------------------------------------------------------------------------  Cardiac Enzymes No results for input(s): TROPONINI in the last 168 hours. ------------------------------------------------------------------------------------------------------------------  RADIOLOGY:  Dg Chest Port 1 View  11/15/2014   CLINICAL DATA:  Shortness of breath and cough.  Weakness.  EXAM: PORTABLE CHEST - 1 VIEW  COMPARISON:  11/08/2014  FINDINGS: Tip of the right chest port remains in the SVC. The heart is at the upper limits normal in size. There are increased perihilar markings, progressed from prior exam. Increased right basilar atelectasis, now with mild left basilar atelectasis. No definite pleural effusion or pneumothorax.  IMPRESSION: Increased perihilar markings concerning pulmonary edema. Increased right basilar atelectasis in development of mild left basilar atelectasis.   Electronically Signed   By: Jeb Levering M.D.   On: 11/15/2014 04:43    EKG:   Orders placed or performed during the hospital encounter of 11/08/14  . EKG 12-Lead  . EKG 12-Lead  . EKG 12-Lead  . EKG 12-Lead  . EKG 12-Lead  . EKG 12-Lead    ASSESSMENT AND PLAN:   69 year old African-American  male with past medical  history significant for mantle cell lymphoma with recurrence, started on chemotherapy 3 days ago, recent admission for left-sided hydronephrosis status post ureteral stent placement last week, comes to the hospital secondary to worsening of abdominal pain and fevers.  #1 sepsis-Likely intraabdominal. Blood cultures are negative at this time..  - Continue Zosyn at this time. Will need ABX for 10 days. Discontinue on 11/18/14. No further fevers - discontinued vanc - blood cultures are negative. - Urology has been consulted. Blood pressure is stable. Has not required any pressors.  #2 acute renal failure-secondary to obstructive uropathy, bilateral hydronephrosis. Secondary to worsening retroperitoneal lymphadenopathy. Appreciate urology consult. - Patient already had left-sided stent placement 10 days ago. -Continue to monitor renal function, IV fluids at this time.  #3 Hypokalemia- replaced, labs from today ordered - on standing orders for 2 days due to chronic hypokalemia, monitor  #4 mantle cell lymphoma-diagnosed in September 2014. Now CT with rapid progression of the lymphadenopathy - on new chemotherapy which was just started. Appreciate oncology consult. poor prognosis per oncology Wife is concerned and wants to consider alternative treatments. As patient is denying any pain she thinks he is clinically getting better and should consider more treatment options, having said that she would like to discuss with patient's primary oncologist Dr. Mike Gip on Monday before making any further decisions , patient is in agreement with his wife's opinion. Discussed with on-call oncologist Dr. Ma Hillock - Patient is DNR  #5 anemia and Thrombocytopenia- - likely from lymphoma itself  - hb at 6.7, platelets at 11 K .we'll transfuse 1 unit of platelet and 1 unit of PRBC. -We'll check CBC in a.m.  - received total 3units plt tx in the last 2 days-  - ? Reaction to platelets with eyelids  swelling- improving with IV steroids- change to PO prednisone and discontinue tomorrow - Transfuse PRBC if Hb<7, Transfuse platelets if Plt count <10k - Leukopenia- neupogen started by oncologist, likely from recent chemo - monitor  #6 . Gout-restarted allopurinol especially with his recent chemotherapy, to help with tumor lysis syndrome.  #7 DVT prophylaxis- subcutaneous heparin.  #6 hypertension-BP is better now, on metoprolol   Overall poor prognosis. Monitor over the weekend and treat supportively. Patient, daughter and wife would like to have a family meeting with primary oncologist Dr. Mike Gip on Monday regarding further treatment plan options. They are not considering hospice at this time as patient  clinically seems to be improving  All the records are reviewed and case discussed with Care Management/Social Workerr. Management plans discussed with the patient, family and they are in agreement.  CODE STATUS: DNR  TOTAL TIME SPENT IN TAKING CARE OF THIS PATIENT: 40 minutes.   Nicholes Mango M.D on 11/16/2014 at 1:31 PM  Between 7am to 6pm - Pager - [phone]  After 6pm go to [email or web address] - password [email or web address] [facility] [address]  [facility]  Office  [phone]  CC: Primary care physician; No PCP Per Patient

## 2014-11-16 NOTE — Plan of Care (Signed)
Problem: Discharge Progression Outcomes Goal: Other Discharge Outcomes/Goals Plan of Care Progress to Goal: Platelets stable at 11 today and yesterday - but transfused 1 unit of Platelets. No reaction.   WBC 1.2.  Hgb is 7.3 down from 7.5. Also transfused 1 u PRBC.   On neutropenic precautions now.

## 2014-11-17 LAB — BASIC METABOLIC PANEL
Anion gap: 7 (ref 5–15)
BUN: 24 mg/dL — AB (ref 6–20)
CALCIUM: 8.4 mg/dL — AB (ref 8.9–10.3)
CO2: 24 mmol/L (ref 22–32)
CREATININE: 1.08 mg/dL (ref 0.61–1.24)
Chloride: 113 mmol/L — ABNORMAL HIGH (ref 101–111)
GFR calc non Af Amer: >60 mL/min (ref >60)
GLUCOSE: 97 mg/dL (ref 65–99)
POTASSIUM: 3.3 mmol/L — AB (ref 3.5–5.1)
Sodium: 144 mmol/L (ref 135–145)

## 2014-11-17 LAB — TYPE AND SCREEN
ABO/RH(D): O POS
ANTIBODY SCREEN: NEGATIVE
UNIT DIVISION: 0
Unit division: 0
Unit division: 0

## 2014-11-17 LAB — PREPARE PLATELET PHERESIS: Unit division: 0

## 2014-11-17 LAB — HEMOGLOBIN AND HEMATOCRIT, BLOOD
HCT: 24.4 % — ABNORMAL LOW (ref 40.0–52.0)
Hemoglobin: 8.1 g/dL — ABNORMAL LOW (ref 13.0–18.0)

## 2014-11-17 LAB — MAGNESIUM: Magnesium: 2.1 mg/dL (ref 1.7–2.4)

## 2014-11-17 MED ORDER — SODIUM CHLORIDE 0.9 % IJ SOLN
INTRAMUSCULAR | Status: AC
  Administered 2014-11-17: 10 mL
  Filled 2014-11-17: qty 10

## 2014-11-17 MED ORDER — FUROSEMIDE 10 MG/ML IJ SOLN
INTRAMUSCULAR | Status: AC
  Filled 2014-11-17: qty 4

## 2014-11-17 MED ORDER — FUROSEMIDE 10 MG/ML IJ SOLN
40.0000 mg | Freq: Once | INTRAMUSCULAR | Status: AC
  Administered 2014-11-17: 40 mg via INTRAVENOUS

## 2014-11-17 NOTE — Plan of Care (Signed)
Problem: Discharge Progression Outcomes Goal: Discharge plan in place and appropriate Individualization  Outcome: Progressing Likes to be called Caleb Francis PMH: Arthritis, HTN, RA, Anemia, CKD, and Lymphoma (Diagnosed Sept. 2014).        High Fall Risk - safety checks with hourly rounding.       Goal: Other Discharge Outcomes/Goals Outcome: Progressing Plan of care progress to goal: 1. Pain: pt stoic and does not elaborate on his needs. Finally able to get patient to agree on taking something for his intermittent c/o chest pain/discomfort he rated as an 8/10. Pain eased off some with  Dilaudid and Maalox. 2. Hemodynamically stable: Pt vital signs have been stable this shift. Pt did complete one unit of prbcs. Tolerated tx without complications. H&H performed post transfusion. Hgb improved from 6.7 to 8.1. Hct 24. Pt for repeat CBC, BMP and Mg level this am.  3. Tolerating diet: Pt drinking sips of water and Ensure. No c/o nausea or vomiting.  4. Activity appropriate for discharge: pt for possible discharge Monday to snf v/s hospice. At baseline as far as activity level.  5. Ativan given once due to restlessness/anxiety with improvement. Family at bedside and very supportive.

## 2014-11-17 NOTE — Progress Notes (Addendum)
Patient became increasingly short of breath around noon.  VSS oxygen saturations 99% on room air.  Patient's breathing appears labored.  Lung assessment same as this am with no major changes.  PRN breathing treatment administered first.  Patient reports some improvement but continues to feel short of breath.  Ativan given to help with anxiety and shortness of breath, Patient reports some additional improvement.  Called MD to make her aware of changes, Patient did get a unit of blood yesterday and has a history of pulmonary edema, ordered to give 40mg  of lasix IV once and order stat chest xray if continues to feel short of breath.  40mg  IV lasix given shortness of breath resolved, large amount of urine output, Patient reports feeling much better

## 2014-11-17 NOTE — Plan of Care (Signed)
Problem: Discharge Progression Outcomes Goal: Other Discharge Outcomes/Goals Outcome: Not Progressing Patient continues to be Alert and oriented,  Platelets remain low MD aware.  No other changes at this time, continuing to tolerate diet and medications well.

## 2014-11-17 NOTE — Progress Notes (Signed)
[place] [facility] [place] NAME: Caleb Francis    MR#:  [id number]  DATE OF BIRTH:  [birth year]  SUBJECTIVE:  CHIEF COMPLAINT:   Chief Complaint  Patient presents with  . Extremity Pain  - alert, and interactive, denies any abdominal pain, chest pain, reports some shortness of breath with exertion Family member at bedside, wants his wife to be medical POA Would like to talk to his primary oncologist Dr. Mike Gip in a.m. regarding other treatment options Took pain medicine only one time in the past 24 hours  REVIEW OF SYSTEMS:  ROSConstitutional: Negative for fever and chills.  Respiratory: Negative for cough, and wheezing. mild shortness of breath with exertion  Cardiovascular: Negative for chest pain and palpitations.  Gastrointestinal: Negative for nausea, vomiting, abdominal pain, diarrhea and constipation.  Minimal abdominal discomfort Genitourinary: Negative for dysuria.  Neurological: Negative for dizziness, seizures and headaches.   DRUG ALLERGIES:  No Known Allergies  VITALS:  Blood pressure 135/81, pulse 111, temperature 98.4 F (36.9 C), temperature source Oral, resp. rate 22, height 6\' 1"  (1.854 m), weight 74.118 kg (163 lb 6.4 oz), SpO2 100 %.  PHYSICAL EXAMINATION:  Physical Exam  GENERAL:  69 y.o.-year-old patient lying in the bed, distress secondary to pain all over. Confused, ill appearing  EYES: Pupils equal, round, reactive to light and accommodation. No scleral icterus. Extraocular muscles intact.  HEENT: Head atraumatic, normocephalic. Oropharynx and nasopharynx clear. Extremely dry mucous membranes. NECK:  Supple, no jugular venous distention. No thyroid enlargement, no tenderness.  LUNGS: Normal breath sounds bilaterally, no wheezing, rales, rhonchi or crepitation. No use of accessory muscles of respiration.  CARDIOVASCULAR: S1, S2 normal. No murmurs, rubs, or gallops.  ABDOMEN: Distended, generalized  tenderness.  Bowel sounds present. No organomegaly or mass.  EXTREMITIES: Left leg 1+ edema, right leg no edema NEUROLOGIC: Cranial nerves II through XII are intact. Generalized weakness present. No focal weakness noted. Sensation is intact. Gait not checked.  Not following commands PSYCHIATRIC:  Patient is more alert, and oriented x 2.  SKIN: No obvious rash, lesion, or ulcer.    LABORATORY PANEL:   CBC  Recent Labs Lab 11/17/14 0438  WBC 2.1*  HGB 7.5*  HCT 22.7*  PLT 15*   ------------------------------------------------------------------------------------------------------------------  Chemistries   Recent Labs Lab 11/17/14 0438  NA 144  K 3.3*  CL 113*  CO2 24  GLUCOSE 97  BUN 24*  CREATININE 1.08  CALCIUM 8.4*  MG 2.1   ------------------------------------------------------------------------------------------------------------------  Cardiac Enzymes No results for input(s): TROPONINI in the last 168 hours. ------------------------------------------------------------------------------------------------------------------  RADIOLOGY:  No results found.  EKG:   Orders placed or performed during the hospital encounter of 11/08/14  . EKG 12-Lead  . EKG 12-Lead  . EKG 12-Lead  . EKG 12-Lead  . EKG 12-Lead  . EKG 12-Lead    ASSESSMENT AND PLAN:   69 year old African-American male with past medical history significant for mantle cell lymphoma with recurrence, started on chemotherapy 3 days ago, recent admission for left-sided hydronephrosis status post ureteral stent placement last week, comes to the hospital secondary to worsening of abdominal pain and fevers.  #1 sepsis-Likely intraabdominal. Blood cultures are negative at this time..  - Continue Zosyn at this time. Will need ABX for 10 days. Discontinue on 11/18/14. No further fevers - discontinued vanc - blood cultures are negative. - Urology has been consulted. Blood pressure is stable. Has not required  any pressors.  #2 acute renal failure-secondary to  obstructive uropathy, bilateral hydronephrosis. Secondary to worsening retroperitoneal lymphadenopathy. Appreciate urology consult. - Patient already had left-sided stent placement 10 days ago. -Continue to monitor renal function, IV fluids at this time.  #3 Hypokalemia- replaced, labs from today ordered - on standing orders for 2 days due to chronic hypokalemia, monitor  #4 mantle cell lymphoma-diagnosed in September 2014. Now CT with rapid progression of the lymphadenopathy - on new chemotherapy which was just started. Appreciate oncology consult. poor prognosis per oncology Wife is concerned and wants to consider alternative treatments. As patient is denying any pain she thinks he is clinically getting better and should consider more treatment options, having said that she would like to discuss with patient's primary oncologist Dr. Mike Gip on Monday before making any further decisions , patient is in agreement with his wife's opinion. Discussed with on-call oncologist Dr. Ma Hillock - Patient is DNR  #5 anemia and Thrombocytopenia- - likely from lymphoma itself  - hb at 7.5, platelets at 15 K . -We'll check CBC in a.m.  - received total 4units plt and 1 unit of PRBC so far - ? Reaction to platelets? with eyelids swelling- improving with IV steroids- changed to PO prednisone and discontinue today, worsening of reaction was not seen after platelet transfusion yesterday - Transfuse PRBC if Hb<7, Transfuse platelets if Plt count <10k - Leukopenia- neupogen started by oncologist, likely from recent chemo - monitor  #6 . Gout-restarted allopurinol especially with his recent chemotherapy, to help with tumor lysis syndrome.  #7 DVT prophylaxis- subcutaneous heparin.  #6 hypertension-BP is better now, on metoprolol   Overall poor prognosis. Monitor over the weekend and treat supportively. Patient, daughter and wife would like to have a family  meeting with primary oncologist Dr. Mike Gip on Monday regarding further treatment plan options. They are not considering hospice at this time as patient  clinically seems to be improving with no significant pain which might be a good prognostic factor per family members,  All the records are reviewed and case discussed with Care Management/Social Workerr. Management plans discussed with the patient, family and they are in agreement.  CODE STATUS: DNR  TOTAL TIME SPENT IN TAKING CARE OF THIS PATIENT:  30 minutes.   Nicholes Mango M.D on 11/17/2014 at 12:38 PM  Between 7am to 6pm - Pager - [phone]  After 6pm go to [email or web address] - password [email or web address] [facility]  [place] [facility]  Office  [phone]  CC: Primary care physician; No PCP Per Patient

## 2014-11-18 ENCOUNTER — Other Ambulatory Visit: Payer: Self-pay | Admitting: Hematology and Oncology

## 2014-11-18 DIAGNOSIS — D696 Thrombocytopenia, unspecified: Secondary | ICD-10-CM | POA: Insufficient documentation

## 2014-11-18 LAB — CBC WITH DIFFERENTIAL/PLATELET
Band Neutrophils: 28 % — ABNORMAL HIGH (ref 0–10)
Basophils Absolute: 0 10*3/uL (ref 0.0–0.1)
Basophils Relative: 0 % (ref 0–1)
Blasts: 0 %
Eosinophils Absolute: 0 10*3/uL (ref 0.0–0.7)
Eosinophils Relative: 0 % (ref 0–5)
HCT: 24.1 % — ABNORMAL LOW (ref 40.0–52.0)
Hemoglobin: 8 g/dL — ABNORMAL LOW (ref 13.0–18.0)
Lymphocytes Relative: 21 % (ref 12–46)
Lymphs Abs: 1.5 10*3/uL (ref 0.7–4.0)
MCH: 27.6 pg (ref 26.0–34.0)
MCHC: 33.4 g/dL (ref 32.0–36.0)
MCV: 82.9 fL (ref 80.0–100.0)
Metamyelocytes Relative: 2 %
Monocytes Absolute: 1 10*3/uL (ref 0.1–1.0)
Monocytes Relative: 14 % — ABNORMAL HIGH (ref 3–12)
Myelocytes: 2 %
Neutro Abs: 4.8 10*3/uL (ref 1.7–7.7)
Neutrophils Relative %: 33 % — ABNORMAL LOW (ref 43–77)
Other: 0 %
Platelets: 20 10*3/uL — CL (ref 150–440)
Promyelocytes Absolute: 0 %
RBC: 2.91 MIL/uL — ABNORMAL LOW (ref 4.40–5.90)
RDW: 17.9 % — ABNORMAL HIGH (ref 11.5–14.5)
WBC: 7.3 10*3/uL (ref 3.8–10.6)
nRBC: 1 /100 WBC — ABNORMAL HIGH

## 2014-11-18 NOTE — Consult Note (Signed)
[facility] Hematology/Oncology Progress Note  Date of admission: 11/08/2014  Hospital day:  11/18/2014  Chief Complaint: Caleb Francis is a 69 y.o. male with mantle cell lymphoma who was admitted with abdominal pain and findings concerning for sepsis  Subjective: Patient feels better today.  He denies any abdominal pain.  Left leg swelling improved.  No bleeding.    Social History: The patient is accompanied by multiple family members including his wife and daughter.  Allergies: No Known Allergies  Scheduled Medications: . allopurinol  300 mg Oral Daily  . antiseptic oral rinse  7 mL Mouth Rinse q12n4p  . chlorhexidine  15 mL Mouth Rinse BID  . docusate sodium  100 mg Oral BID  . feeding supplement (ENSURE ENLIVE)  237 mL Oral BID BM  . filgrastim  480 mcg Subcutaneous Daily  . metoprolol tartrate  25 mg Oral BID  . piperacillin-tazobactam (ZOSYN)  IV  3.375 g Intravenous 3 times per day  . potassium chloride  40 mEq Oral BID  . predniSONE  50 mg Oral Q breakfast  . senna-docusate  2 tablet Oral BID   Review of Systems: GENERAL:  Feels much better.  No fevers, sweats or weight loss.  Denies pain. PERFORMANCE STATUS (ECOG):  2 HEENT:  No visual changes, runny nose, sore throat, mouth sores or tenderness. Lungs: No shortness of breath or cough.  No hemoptysis. Cardiac:  No chest pain, palpitations, orthopnea, or PND. GI:  No nausea, vomiting, diarrhea, constipation, melena or hematochezia. GU:  No urgency, frequency, dysuria, or hematuria. Musculoskeletal:  No back pain.  No joint pain.  No muscle tenderness. Extremities:  No pain or swelling. Skin:  No rashes or skin changes. Neuro:  No headache, numbness or weakness, balance or coordination issues. Endocrine:  No diabetes, thyroid issues, hot flashes or night sweats. Psych:  No mood changes, depression or anxiety. Pain:  No focal pain. Review of systems:  All other systems reviewed and found to be  negative.  Physical Exam: Blood pressure 121/62, pulse 100, temperature 98.7 F (37.1 C), temperature source Oral, resp. rate 18, height 6' 1" (1.854 m), weight 157 lb 11.2 oz (71.532 kg), SpO2 100 %.  GENERAL:  Well developed, well nourished, gentleman sitting comfortably on the medical unit in no acute distress. MENTAL STATUS:  Alert and oriented to person, place and time. HEAD:  Normocephalic, atraumatic, face symmetric, no Cushingoid features. EYES:  Brown eyes.  Pupils equal round and reactive to light and accomodation.  No conjunctivitis or scleral icterus. ENT:  Oropharynx clear without lesion.  Tongue normal. Mucous membranes moist.  RESPIRATORY:  Clear to auscultation without rales, wheezes or rhonchi. CARDIOVASCULAR:  Regular rate and rhythm without murmur, rub or gallop. ABDOMEN:  Soft, non-tender, with active bowel sounds, and no hepatosplenomegaly.  No masses. SKIN:  No rashes, ulcers or lesions. EXTREMITIES: Dramatic improvement in left lower extremity edema with only 1+ ankle edema.  No skin discoloration or tenderness.  No palpable cords. LYMPH NODES: Cervical adenopathy resolved.  Left axillary node 3 cm (improved).  Left inguinal node less distinct and approximately 2 cm.  No palpable supraclavicular adenopathy  NEUROLOGICAL: Unremarkable. PSYCH:  Appropriate.  Results for orders placed or performed during the hospital encounter of 11/08/14 (from the past 48 hour(s))  Type and screen     Status: None   Collection Time: 11/16/14  2:06 PM  Result Value Ref Range   ABO/RH(D) O POS    Antibody Screen NEG  Sample Expiration 11/19/2014    Unit Number [id number]    Blood Component Type RBC, LR IRR    Unit division 00    Status of Unit ISSUED,FINAL    Transfusion Status OK TO TRANSFUSE    Crossmatch Result Compatible    Unit Number [id number]    Blood Component Type RBC, LR IRR    Unit division 00    Status of Unit REL FROM [facility]    Transfusion Status OK TO  TRANSFUSE    Crossmatch Result Compatible    Unit Number [id number]    Blood Component Type RBC, LR IRR    Unit division 00    Status of Unit REL FROM [facility]    Transfusion Status OK TO TRANSFUSE    Crossmatch Result Compatible   Prepare RBC     Status: None   Collection Time: 11/16/14  2:06 PM  Result Value Ref Range   Order Confirmation ORDER PROCESSED BY BLOOD BANK   Hemoglobin and hematocrit, blood     Status: Abnormal   Collection Time: 11/16/14 11:51 PM  Result Value Ref Range   Hemoglobin 8.1 (L) 13.0 - 18.0 g/dL   HCT 24.4 (L) 40.0 - 52.0 %  CBC with Differential     Status: Abnormal   Collection Time: 11/17/14  4:38 AM  Result Value Ref Range   WBC 2.1 (L) 3.8 - 10.6 K/uL   RBC 2.72 (L) 4.40 - 5.90 MIL/uL   Hemoglobin 7.5 (L) 13.0 - 18.0 g/dL   HCT 22.7 (L) 40.0 - 52.0 %   MCV 83.6 80.0 - 100.0 fL   MCH 27.6 26.0 - 34.0 pg   MCHC 33.0 32.0 - 36.0 g/dL   RDW 17.7 (H) 11.5 - 14.5 %   Platelets 15 (LL) 150 - 440 K/uL    Comment: CRITICAL VALUE PREVIOUSLY CALLED RESULT REPEATED AND VERIFIED    Neutrophils Relative % 4 %   Lymphocytes Relative 52 %   Monocytes Relative 42 %   Eosinophils Relative 2 %   Basophils Relative 0 %   nRBC 5 (H) 0 /100 WBC    Comment: PENDING PATHOLOGIST REVIEW   Neutro Abs 0.1 (L) 1.4 - 6.5 K/uL   Lymphs Abs 1.1 1.0 - 3.6 K/uL   Monocytes Absolute 0.9 0.2 - 1.0 K/uL   Eosinophils Absolute 0.0 0 - 0.7 K/uL   Basophils Absolute 0.0 0 - 0.1 K/uL   RBC Morphology MIXED RBC POPULATION     Comment: POLYCHROMASIA PRESENT TARGET CELLS    Smear Review PLATELET COUNT CONFIRMED BY SMEAR   Basic metabolic panel     Status: Abnormal   Collection Time: 11/17/14  4:38 AM  Result Value Ref Range   Sodium 144 135 - 145 mmol/L   Potassium 3.3 (L) 3.5 - 5.1 mmol/L   Chloride 113 (H) 101 - 111 mmol/L   CO2 24 22 - 32 mmol/L   Glucose, Bld 97 65 - 99 mg/dL   BUN 24 (H) 6 - 20 mg/dL   Creatinine, Ser 1.08 0.61 - 1.24 mg/dL   Calcium 8.4 (L) 8.9 -  10.3 mg/dL   GFR calc non Af Amer >60 >60 mL/min   GFR calc Af Amer >60 >60 mL/min    Comment: (NOTE) The eGFR has been calculated using the CKD EPI equation. This calculation has not been validated in all clinical situations. eGFR's persistently <60 mL/min signify possible Chronic Kidney Disease.    Anion gap 7 5 - 15  Magnesium  Status: None   Collection Time: 11/17/14  4:38 AM  Result Value Ref Range   Magnesium 2.1 1.7 - 2.4 mg/dL  CBC with Differential     Status: Abnormal   Collection Time: 11/18/14  5:37 AM  Result Value Ref Range   WBC 7.3 3.8 - 10.6 K/uL   RBC 2.91 (L) 4.40 - 5.90 MIL/uL   Hemoglobin 8.0 (L) 13.0 - 18.0 g/dL   HCT 24.1 (L) 40.0 - 52.0 %   MCV 82.9 80.0 - 100.0 fL   MCH 27.6 26.0 - 34.0 pg   MCHC 33.4 32.0 - 36.0 g/dL   RDW 17.9 (H) 11.5 - 14.5 %   Platelets 20 (LL) 150 - 440 K/uL    Comment: CRITICAL VALUE NOTED.  VALUE IS CONSISTENT WITH PREVIOUSLY REPORTED AND CALLED VALUE.   Neutrophils Relative % 33 (L) 43 - 77 %   Lymphocytes Relative 21 12 - 46 %   Monocytes Relative 14 (H) 3 - 12 %   Eosinophils Relative 0 0 - 5 %   Basophils Relative 0 0 - 1 %   Band Neutrophils 28 (H) 0 - 10 %   Metamyelocytes Relative 2 %   Myelocytes 2 %   Promyelocytes Absolute 0 %   Blasts 0 %   nRBC 1 (H) 0 /100 WBC   Other 0 %   Neutro Abs 4.8 1.7 - 7.7 K/uL   Lymphs Abs 1.5 0.7 - 4.0 K/uL   Monocytes Absolute 1.0 0.1 - 1.0 K/uL   Eosinophils Absolute 0.0 0.0 - 0.7 K/uL   Basophils Absolute 0.0 0.0 - 0.1 K/uL   RBC Morphology POLYCHROMASIA PRESENT     Comment: TARGET CELLS   No results found.  Assessment:  EVONTE PRESTAGE is a 69 y.o. male with rapidly progressive mantle cell lymphoma currently day 13 status post cycle #1 RICE chemotherapy.  Plan: 1. Hematology/Oncology: Patient has improved dramatically over the weekend.  Abdominal pain has resolved.  Left lower extremity edema caused by abdominal adenopathy has nearly resolved.  Cervical  lymphadenopathy has resolved.  Left axillary and left inguinal nodes have  improved.    Discussed with patient and family consideration of follow-up CT scans on 11/25/2014.  If scans show improvement, would proceed with cycle #2 RICE chemotherapy. RICE chemotherapy would be given over 3 days as an outpatient at the [facility]. Hopefully platelet count will recover to > 100,000 to allow treatment.    Platelet count appears to be self sustaining, as platelets have improved without transfusion support overnight.  ANC adequate, thus discontinue GCSF (Neupogen).  2.  Disposition-  Spoke to patient about discharge to skilled nursing facility.  Lequita Asal, MD  11/18/2014, 1:47 PM

## 2014-11-18 NOTE — Progress Notes (Signed)
[place] at [place] NAME: Caleb Francis    MR#:  [id number]  DATE OF BIRTH:  02-08-1946  SUBJECTIVE:  CHIEF COMPLAINT:   Chief Complaint  Patient presents with  . Extremity Pain  - alert, and interactive, denies any abdominal pain, chest pain, reports some shortness of breath with exertion, feels better after giving lasix Family member at bedside, wants his wife to be medical POA Would like to talk to his primary oncologist Dr. Mike Francis in a.m. regarding other treatment options   REVIEW OF SYSTEMS:  ROSConstitutional: Negative for fever and chills.  Respiratory: Negative for cough, and wheezing. mild shortness of breath with exertion  Cardiovascular: Negative for chest pain and palpitations.  Gastrointestinal: Negative for nausea, vomiting, abdominal pain, diarrhea and constipation.  Minimal abdominal discomfort Genitourinary: Negative for dysuria.  Neurological: Negative for dizziness, seizures and headaches.   DRUG ALLERGIES:  No Known Allergies  VITALS:  Blood pressure 129/74, pulse 100, temperature 98.7 F (37.1 C), temperature source Oral, resp. rate 18, height 6\' 1"  (1.854 m), weight 71.532 kg (157 lb 11.2 oz), SpO2 100 %.  PHYSICAL EXAMINATION:  Physical Exam  GENERAL:  69 y.o.-year-old patient lying in the bed, distress secondary to pain all over. Confused, ill appearing  EYES: Pupils equal, round, reactive to light and accommodation. No scleral icterus. Extraocular muscles intact.  HEENT: Head atraumatic, normocephalic. Oropharynx and nasopharynx clear. Extremely dry mucous membranes. NECK:  Supple, no jugular venous distention. No thyroid enlargement, no tenderness.  LUNGS: Normal breath sounds bilaterally, no wheezing, rales, rhonchi or crepitation. No use of accessory muscles of respiration.  CARDIOVASCULAR: S1, S2 normal. No murmurs, rubs, or gallops.  ABDOMEN: Distended, generalized tenderness.  Bowel  sounds present. No organomegaly or mass.  EXTREMITIES: Left leg 1+ edema, right leg no edema NEUROLOGIC: Cranial nerves II through XII are intact. Generalized weakness present. No focal weakness noted. Sensation is intact. Gait not checked.  Not following commands PSYCHIATRIC:  Patient is more alert, and oriented x 2.  SKIN: No obvious rash, lesion, or ulcer.    LABORATORY PANEL:   CBC  Recent Labs Lab 11/18/14 0537  WBC 7.3  HGB 8.0*  HCT 24.1*  PLT 20*   ------------------------------------------------------------------------------------------------------------------  Chemistries   Recent Labs Lab 11/17/14 0438  NA 144  K 3.3*  CL 113*  CO2 24  GLUCOSE 97  BUN 24*  CREATININE 1.08  CALCIUM 8.4*  MG 2.1   ------------------------------------------------------------------------------------------------------------------  Cardiac Enzymes No results for input(s): TROPONINI in the last 168 hours. ------------------------------------------------------------------------------------------------------------------  RADIOLOGY:  No results found.  EKG:   Orders placed or performed during the hospital encounter of 11/08/14  . EKG 12-Lead  . EKG 12-Lead  . EKG 12-Lead  . EKG 12-Lead  . EKG 12-Lead  . EKG 12-Lead    ASSESSMENT AND PLAN:   69 year old African-American male with past medical history significant for mantle cell lymphoma with recurrence, started on chemotherapy 3 days ago, recent admission for left-sided hydronephrosis status post ureteral stent placement last week, comes to the hospital secondary to worsening of abdominal pain and fevers.  #1 sepsis-Likely intraabdominal. Blood cultures are negative at this time..  - Continued Zosyn  for 10 days. Will discontinue on 11/18/14. No further fevers - discontinued vanc - blood cultures are negative. - Urology has been consulted. Blood pressure is stable. Has not required any pressors.  #2 acute renal  failure-secondary to obstructive uropathy, bilateral hydronephrosis. Secondary to worsening retroperitoneal lymphadenopathy.  Appreciate urology consult. - Patient already had left-sided stent placement 10 days ago. -Continue to monitor renal function, IV fluids at this time.  #3 Hypokalemia- replaced, labs from today ordered - on standing orders for 2 days due to chronic hypokalemia, monitor  #4 mantle cell lymphoma-diagnosed in September 2014. Now CT with rapid progression of the lymphadenopathy - on new chemotherapy which was just started. Appreciate oncology consult. poor prognosis per oncology Wife is concerned and wants to consider alternative treatments. As patient is denying any pain she thinks he is clinically getting better and should consider more treatment options, having said that she would like to discuss with patient's primary oncologist Dr. Mike Francis today before making any further decisions , patient is in agreement with his wife's opinion. Discussed with on-call oncologist Dr. Ma Francis - Patient is DNR  #5 anemia and Thrombocytopenia- - likely from lymphoma itself  - hb at 8.0, platelets at 20 K . -We'll check CBC in a.m.  - received total 4units plt and 1 unit of PRBC so far - ? Reaction to platelets? with eyelids swelling- improving with IV steroids- changed to PO prednisone and discontinued 6/6, worsening of reaction was not seen after platelet transfusion yesterday - Transfuse PRBC if Hb<7, Transfuse platelets if Plt count <10k - Leukopenia-on  neupogen  , improved  likely from recent chemo - monitor  #6 . Gout-restarted allopurinol especially with his recent chemotherapy, to help with tumor lysis syndrome.  #7 DVT prophylaxis- subcutaneous heparin.  #6 hypertension-BP is better now, on metoprolol   Overall poor prognosis. Monitor over the weekend and treat supportively. Patient, daughter and wife would like to have a family meeting with primary oncologist Dr. Mike Francis  today regarding further treatment plan options. They are not considering hospice at this time as patient  clinically seems to be improving with no significant pain which might be a good prognostic factor per family members,  Plan - d/c home with home PT per CM if hospice is not an option  All the records are reviewed and case discussed with Care Management/Social Workerr. Management plans discussed with the patient, family and they are in agreement.  CODE STATUS: DNR  TOTAL TIME SPENT IN TAKING CARE OF THIS PATIENT:  30 minutes.   Nicholes Mango M.D on 11/18/2014 at 8:47 AM  Between 7am to 6pm - Pager - [phone]  After 6pm go to [email or web address] - password [email or web address] [facility]  [place] [facility]  Office  [phone]  CC: Primary care physician; No PCP Per Patient

## 2014-11-18 NOTE — Progress Notes (Signed)
Update: Per conversation with CSW Judson Roch, patient is now appropriate for rehab at [facility]. Family/patient are not pursuing hospice services at this time. Please contact hospice if needs change. Thank you. Flo Shanks RN, BSN, [place] [facility] [place], [facility] [phone] c

## 2014-11-18 NOTE — Plan of Care (Signed)
Problem: Discharge Progression Outcomes Goal: Other Discharge Outcomes/Goals Plan of care progress to goal: Pain: denies pain Diet: poor appetite SOB: ativan given with relief

## 2014-11-18 NOTE — Clinical Social Work Note (Signed)
Clinical Social Work Assessment  Patient Details  Name: Caleb Francis MRN: [id number] Date of Birth: [birth year]  Date of referral:  11/18/14               Reason for consult:  Facility Placement                Permission sought to share information with:  Family Supports Permission granted to share information::  Yes, Verbal Permission Granted   Housing/Transportation Living arrangements for the past 2 months:  [place] of Information:  Patient, Adult Children Patient Interpreter Needed:  None Criminal Activity/Legal Involvement Pertinent to Current Situation/Hospitalization:  No - Comment as needed Significant Relationships:  Adult Children Lives with:  Self Do you feel safe going back to the place where you live?  Yes Need for family participation in patient care:  Yes (Comment)  Care giving concerns:   None reported   Facilities manager / plan:  CSW met with pt and family to address consult for SNF. CSW introduced herself and explained role of social work. CSW also explained the process of discharging to SNF for STR. Pt is undergoing chemo treatment at the [facility]. PT is recommending SNF. CSW completed FL2 (which is on the chart for MD signature). CSW initiated SNF search and will follow up with bed offers.   Employment status:  Retired Nurse, adult PT Recommendations:  [place] / Referral to community resources:  [place]  Patient/Family's Response to care:  Pt and family are agreeable to discharge to SNF.   Patient/Family's Understanding of and Emotional Response to Diagnosis, Current Treatment, and Prognosis:  Pt has had extensive admission and he understands the need for SNF.   Emotional Assessment Appearance:  Appears stated age Attitude/Demeanor/Rapport:  Other (Appropriate) Affect (typically observed):  Accepting, Adaptable Orientation:  Oriented to Self, Oriented  to Place, Oriented to  Time, Oriented to Situation Alcohol / Substance use:  Never Used Psych involvement (Current and /or in the community):  No (Comment)  Discharge Needs  Concerns to be addressed:  Adjustment to Illness Readmission within the last 30 days:  No Current discharge risk:  None Barriers to Discharge:  No Barriers Identified   Darden Dates, LCSW 11/18/2014, 5:00 PM

## 2014-11-18 NOTE — Evaluation (Signed)
Physical Therapy Evaluation Patient Details Name: Caleb Francis MRN: [id number] DOB: Oct 06, 1945 Today's Date: 11/18/2014   History of Present Illness  Pt is a 69 y.o. 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  Pt admitted with sepsis, and acute renal failure.  Pt s/p L ureteral stent recently for L sided hydronephrosis.  Pt s/p 4 units platelets and 1 unit PRBC.  Pt on neutropenic precautions.  Clinical Impression  Currently pt demonstrates impairments with strength, balance, activity tolerance and limitations with functional mobility.  Prior to admission, pt was independent ambulating with [place].  Pt lives alone in 1 level home with 5 steps to enter with B railing.  Currently pt is CGA to min assist with bed mobility, transfers, and gait with RW; activity limited d/t SOB on exertion and impaired activity tolerance.  Pt would benefit from skilled PT to address above noted impairments and functional limitations.  Recommend pt discharge to STR when medically appropriate (pt in agreement with this).     Follow Up Recommendations SNF    Equipment Recommendations       Recommendations for Other Services       Precautions / Restrictions Precautions Precautions: Fall Precaution Comments: Aspiration precautions; Neutropenic precautions Restrictions Weight Bearing Restrictions: No      Mobility  Bed Mobility Overal bed mobility: Needs Assistance Bed Mobility: Supine to Sit     Supine to sit: Min assist;HOB elevated     General bed mobility comments: SOB with effort  Transfers Overall transfer level: Needs assistance Equipment used: Rolling walker (2 wheeled) Transfers: Sit to/from [facility] Sit to Stand: Min assist Stand pivot transfers: Min assist (transfer to commode)       General transfer comment: increased effort required to stand; assist to initiate  stand  Ambulation/Gait Ambulation/Gait assistance: Min guard;Min assist Ambulation Distance (Feet): 45 Feet Assistive device: Rolling walker (2 wheeled)       General Gait Details: decreased cadence; decreased B step length/foot clearance/heelstrike; SOB with activity  Stairs            Wheelchair Mobility    Modified Rankin (Stroke Patients Only)       Balance                                             Pertinent Vitals/Pain Pain Assessment: No/denies pain  Vitals: at rest HR 100 bpm; O2 97% on room air; after activity HR 117 bpm; O2 95% on room air.     Home Living Family/patient expects to be discharged to:: Skilled nursing facility Living Arrangements: Alone Available Help at Discharge:  (None)   Home Access: Stairs to enter Entrance Stairs-Rails: Can reach both;Right;Left Entrance Stairs-Number of Steps: 5 Home Layout: One level Home Equipment: Walker - 2 wheels;Cane - single point;Shower seat      Prior Function Level of Independence: Independent with assistive device(s)         Comments: [place]     Hand Dominance        Extremity/Trunk Assessment   Upper Extremity Assessment: Generalized weakness           Lower Extremity Assessment: Generalized weakness         Communication   Communication: No difficulties  Cognition Arousal/Alertness: Awake/alert Behavior During Therapy: WFL for tasks assessed/performed Overall Cognitive  Status: Within Functional Limits for tasks assessed                      General Comments  Nursing cleared pt for participation in physical therapy.  Pt agreeable to PT session.    Exercises        Assessment/Plan    PT Assessment Patient needs continued PT services  PT Diagnosis Difficulty walking;Generalized weakness   PT Problem List Decreased strength;Decreased range of motion;Decreased activity tolerance;Decreased balance;Decreased mobility  PT Treatment Interventions  DME instruction;Gait training;Stair training;Functional mobility training;Therapeutic activities;Therapeutic exercise;Balance training;Patient/family education   PT Goals (Current goals can be found in the Care Plan section) Acute Rehab PT Goals Patient Stated Goal: to get stronger PT Goal Formulation: With patient Time For Goal Achievement: 12/02/14 Potential to Achieve Goals: Good    Frequency Min 2X/week   Barriers to discharge Decreased caregiver support      Co-evaluation               End of Session Equipment Utilized During Treatment: Gait belt Activity Tolerance: Patient limited by fatigue Patient left: in bed;with call bell/phone within reach;with bed alarm set Nurse Communication: Mobility status         Time: 9381-0175 PT Time Calculation (min) (ACUTE ONLY): 42 min   Charges:   PT Evaluation $Initial PT Evaluation Tier I: 1 Procedure PT Treatments $Therapeutic Activity: 8-22 mins   PT G CodesLeitha Bleak 2014/11/21, 5:24 PM Leitha Bleak, [place]

## 2014-11-18 NOTE — Plan of Care (Signed)
Problem: Discharge Progression Outcomes Goal: Other Discharge Outcomes/Goals Outcome: Progressing Plan of Care Progress to Goal:  Pt was hemodynamically stable today Platelets were 20 and Hgb was 8.0.  No c/o pain.  Pt wked w/PT which recommends SNF. Bed search underway. Pt appears much improved from Sat when he was very SOB and abd distended.  IVF had been d/ced and lasix and ativan given. Pt is breathing much better. Family at bedside.  [name] PO intake still poor.

## 2014-11-19 ENCOUNTER — Other Ambulatory Visit: Payer: Self-pay | Admitting: Hematology and Oncology

## 2014-11-19 DIAGNOSIS — C8310 Mantle cell lymphoma, unspecified site: Secondary | ICD-10-CM

## 2014-11-19 LAB — CBC WITH DIFFERENTIAL/PLATELET
Band Neutrophils: 15 % — ABNORMAL HIGH (ref 0–10)
Basophils Absolute: 0 10*3/uL (ref 0–0.1)
Basophils Absolute: 0 10*3/uL (ref 0.0–0.1)
Basophils Relative: 0 %
Basophils Relative: 0 % (ref 0–1)
Blasts: 0 %
Eosinophils Absolute: 0 10*3/uL (ref 0–0.7)
Eosinophils Absolute: 0.2 10*3/uL (ref 0.0–0.7)
Eosinophils Relative: 2 %
Eosinophils Relative: 1 % (ref 0–5)
HCT: 22.7 % — ABNORMAL LOW (ref 40.0–52.0)
HCT: 24.9 % — ABNORMAL LOW (ref 40.0–52.0)
Hemoglobin: 7.5 g/dL — ABNORMAL LOW (ref 13.0–18.0)
Hemoglobin: 8.3 g/dL — ABNORMAL LOW (ref 13.0–18.0)
Lymphocytes Relative: 52 %
Lymphocytes Relative: 13 % (ref 12–46)
Lymphs Abs: 1.1 10*3/uL (ref 1.0–3.6)
Lymphs Abs: 2.3 10*3/uL (ref 0.7–4.0)
MCH: 27.6 pg (ref 26.0–34.0)
MCH: 27.7 pg (ref 26.0–34.0)
MCHC: 33 g/dL (ref 32.0–36.0)
MCHC: 33.5 g/dL (ref 32.0–36.0)
MCV: 83.6 fL (ref 80.0–100.0)
MCV: 82.9 fL (ref 80.0–100.0)
Metamyelocytes Relative: 1 %
Monocytes Absolute: 0.9 10*3/uL (ref 0.2–1.0)
Monocytes Absolute: 0.9 10*3/uL (ref 0.1–1.0)
Monocytes Relative: 42 %
Monocytes Relative: 5 % (ref 3–12)
Myelocytes: 2 %
Neutro Abs: 0.1 10*3/uL — ABNORMAL LOW (ref 1.4–6.5)
Neutro Abs: 14.6 10*3/uL — ABNORMAL HIGH (ref 1.7–7.7)
Neutrophils Relative %: 4 %
Neutrophils Relative %: 63 % (ref 43–77)
Platelets: 15 10*3/uL — CL (ref 150–440)
Platelets: 32 10*3/uL — ABNORMAL LOW (ref 150–440)
Promyelocytes Absolute: 0 %
RBC: 2.72 MIL/uL — ABNORMAL LOW (ref 4.40–5.90)
RBC: 3 MIL/uL — ABNORMAL LOW (ref 4.40–5.90)
RDW: 17.7 % — ABNORMAL HIGH (ref 11.5–14.5)
RDW: 18.8 % — ABNORMAL HIGH (ref 11.5–14.5)
WBC: 2.1 10*3/uL — ABNORMAL LOW (ref 3.8–10.6)
WBC: 18 10*3/uL — ABNORMAL HIGH (ref 3.8–10.6)
nRBC: 5 /100 WBC — ABNORMAL HIGH
nRBC: 2 /100 WBC — ABNORMAL HIGH

## 2014-11-19 LAB — BASIC METABOLIC PANEL
Anion gap: 8 (ref 5–15)
BUN: 18 mg/dL (ref 6–20)
CO2: 26 mmol/L (ref 22–32)
CREATININE: 1.1 mg/dL (ref 0.61–1.24)
Calcium: 8.3 mg/dL — ABNORMAL LOW (ref 8.9–10.3)
Chloride: 108 mmol/L (ref 101–111)
GFR calc Af Amer: >60 mL/min (ref >60)
GLUCOSE: 102 mg/dL — AB (ref 65–99)
Potassium: 3.2 mmol/L — ABNORMAL LOW (ref 3.5–5.1)
SODIUM: 142 mmol/L (ref 135–145)

## 2014-11-19 MED ORDER — CETYLPYRIDINIUM CHLORIDE 0.05 % MT LIQD: 7.0000 mL | Freq: Two times a day (BID) | OROMUCOSAL | Status: DC

## 2014-11-19 MED ORDER — ENSURE ENLIVE PO LIQD: 237.0000 mL | Freq: Two times a day (BID) | ORAL | Status: DC

## 2014-11-19 MED ORDER — ALUM & MAG HYDROXIDE-SIMETH 200-200-20 MG/5ML PO SUSP: 30.0000 mL | Freq: Four times a day (QID) | ORAL | Status: DC | PRN

## 2014-11-19 MED ORDER — OXYCODONE-ACETAMINOPHEN 5-325 MG PO TABS: 1.0000 | ORAL_TABLET | ORAL | Status: DC | PRN

## 2014-11-19 MED ORDER — POTASSIUM CHLORIDE ER 10 MEQ PO TBCR: 40.0000 meq | EXTENDED_RELEASE_TABLET | Freq: Two times a day (BID) | ORAL | Status: DC

## 2014-11-19 MED ORDER — METOPROLOL TARTRATE 25 MG PO TABS: 25.0000 mg | ORAL_TABLET | Freq: Two times a day (BID) | ORAL | Status: DC

## 2014-11-19 MED ORDER — SENNOSIDES-DOCUSATE SODIUM 8.6-50 MG PO TABS: 2.0000 | ORAL_TABLET | Freq: Two times a day (BID) | ORAL | Status: DC

## 2014-11-19 NOTE — Discharge Summary (Signed)
[facility] NAME: Caleb Francis    MR#:  [id number]  DATE OF BIRTH:  10-24-1945  DATE OF ADMISSION:  11/08/2014 ADMITTING PHYSICIAN: Juluis Mire, MD  DATE OF DISCHARGE: 11/19/2014  PRIMARY CARE PHYSICIAN: No PCP Per Patient    ADMISSION DIAGNOSIS:  Sepsis, due to unspecified organism [A41.9]  DISCHARGE DIAGNOSIS:  Principal Problem:   Sepsis Active Problems:   Mantle cell lymphoma   Abdominal pain   Hydronephrosis   Thrombocytopenia   SECONDARY DIAGNOSIS:   Past Medical History  Diagnosis Date  . Arthritis   . Hypertension   . RA (rheumatoid arthritis)   . Anemia   . Cancer     lymphoma  . History of nuclear stress test     a. 12/2013: low risk, no sig ischemia, no EKG changes, no artifact, EF 63%  . Chronic kidney disease     HOSPITAL COURSE:   Brief history and physical  Please review history and physical for complete details . Patient came into the ED with a chief complaint of abdominal pain and decreased responsiveness. Patient was febrile with a temperature of 101.45F at the time of arrival. Patient received chemotherapy just 2 days ago prior to his [place] work revealed normal CBC, normal BMP, elevated ALP, elevated lactate of 4.2. Urinalysis unremarkable for any infection. Chest x-ray negative for pneumonia. EKG sinus tach with ventricular rate of 129 bpm. Obtaining blood and urine cultures patient was started on IV antibiotics namely vancomycin and Zosyn. In view of continued abdominal pain, patient underwent CT of the abdomen and pelvis which revealed moderate bilateral hydronephrosis, right-sided new hydronephrosis, bulky retroperitoneal adenopathy which is progressive. Urology on call was consulted by the ED physician who advised no acute intervention at this time hence a point for admission for medicine.[name] Hence hospitalist service was consulted for further management. Patient is very  lethargic at this time, responding minimally to few questions monosyllabic.  Hospital course   #1 sepsis-Likely intraabdominal. Blood cultures are negative  Initially patient was started on Zosyn and vancomycin. - Continued Zosyn for 10 days during the hospital course and discontinued on 11/18/14. No further fevers - discontinued vanc - blood cultures are negative. - Urology has been consulted. Blood pressure is stable. Has not required any pressors.  #2 acute renal failure-secondary to obstructive uropathy, bilateral hydronephrosis. Secondary to worsening retroperitoneal lymphadenopathy. Appreciate urology consult. - Patient already had left-sided stent placement 10 days ago. -Recommended  to monitor renal function, IV fluids were given and renal function improved   #3 Hypokalemia- replaced, labs from today ordered - on standing orders for 2 days due to chronic hypokalemia, monitor  #4 mantle cell lymphoma-diagnosed in September 2014. Now CT with rapid progression of the lymphadenopathy - on new chemotherapy which was just started. Appreciate oncology consult. poor prognosis per oncology Initially the plan was to consider hospice care, but with blood transfusions and IV antibiotics with the recent chemotherapy patient's critical situation significantly improved. Wife  wants to consider alternative treatments.  Dr. Mike Gip is a considering to repeat CAT scan of the abdomen on June 13 and might consider repeat chemotherapy based on the CT scan results.-  Patient is DNR  #5 anemia and Thrombocytopenia- - likely from lymphoma itself - hb at 8.4, platelets at 32 K . -  received total 4units plt and 1 unit of PRBC so far - ? Reaction to platelets? with eyelids swelling- improving with IV steroids- changed  to PO prednisone and discontinued 6/6, worsening of reaction was not seen after platelet transfusion yesterday -  - Leukopenia-improved with Neupogen- monitor closely  #6 .  Gout-restarted allopurinol especially with his recent chemotherapy, to help with tumor lysis syndrome.  #7 DVT prophylaxis- subcutaneous heparin.  #6 hypertension-BP is better now, on metoprolol   Patient is getting transferred to skilled nursing care to follow-up with oncology in a week  DISCHARGE CONDITIONS:   Satisfactory  CONSULTS OBTAINED:  Treatment Team:  Alexis Frock, MD Evlyn Kanner, NP   PROCEDURES none  DRUG ALLERGIES:  No Known Allergies  DISCHARGE MEDICATIONS:   Current Discharge Medication List    START taking these medications   Details  alum & mag hydroxide-simeth (MAALOX/MYLANTA) 200-200-20 MG/5ML suspension Take 30 mLs by mouth every 6 (six) hours as needed for indigestion or heartburn. Qty: 355 mL, Refills: 0    antiseptic oral rinse (CPC / CETYLPYRIDINIUM CHLORIDE 0.05%) 0.05 % LIQD solution 7 mLs by Mouth Rinse route 2 times daily at 12 noon and 4 pm. Qty: 50 mL, Refills: 0    potassium chloride (K-DUR) 10 MEQ tablet Take 4 tablets (40 mEq total) by mouth 2 (two) times daily. Qty: 20 tablet, Refills: 0    senna-docusate (SENOKOT-S) 8.6-50 MG per tablet Take 2 tablets by mouth 2 (two) times daily.      CONTINUE these medications which have CHANGED   Details  feeding supplement, ENSURE ENLIVE, (ENSURE ENLIVE) LIQD Take 237 mLs by mouth 2 (two) times daily between meals. Qty: 237 mL, Refills: 12    metoprolol tartrate (LOPRESSOR) 25 MG tablet Take 1 tablet (25 mg total) by mouth 2 (two) times daily. Qty: 60 tablet, Refills: 0    oxyCODONE-acetaminophen (PERCOCET/ROXICET) 5-325 MG per tablet Take 1-2 tablets by mouth every 4 (four) hours as needed for moderate pain. Qty: 30 tablet, Refills: 0      CONTINUE these medications which have NOT CHANGED   Details  acetaminophen (TYLENOL) 325 MG tablet Take 2 tablets (650 mg total) by mouth every 6 (six) hours as needed for mild pain or fever. Qty: 30 tablet, Refills: 0   Associated Diagnoses:  Mantle cell lymphoma    allopurinol (ZYLOPRIM) 300 MG tablet Take 1 tablet (300 mg total) by mouth daily. Qty: 30 tablet, Refills: 2   Associated Diagnoses: Mantle cell lymphoma    docusate sodium (COLACE) 100 MG capsule Take 1 capsule (100 mg total) by mouth 2 (two) times daily. Qty: 10 capsule, Refills: 0    folic acid (FOLVITE) 1 MG tablet Take 1 mg by mouth.    magnesium oxide (MAG-OX) 400 (241.3 MG) MG tablet Take 1 tablet (400 mg total) by mouth 2 (two) times daily. Qty: 30 tablet, Refills: 1    oxybutynin (DITROPAN) 5 MG tablet Take 1 tablet (5 mg total) by mouth every 8 (eight) hours as needed for bladder spasms (spasms or stent related pain). Qty: 30 tablet, Refills: 0   Associated Diagnoses: Mantle cell lymphoma      STOP taking these medications     amLODipine (NORVASC) 5 MG tablet      amoxicillin-clavulanate (AUGMENTIN) 875-125 MG per tablet      KLOR-CON M10 10 MEQ tablet      spironolactone (ALDACTONE) 25 MG tablet          DISCHARGE INSTRUCTIONS:   Follow-up with primary care physician in a week Follow-up with oncology as scheduled in a week, repeat CAT scan as recommended by oncology on  June 13  DIET:  Low-salt  DISCHARGE CONDITION:   Satisfactory ACTIVITY:  {As tolerated, as recommended by physical therapy OXYGEN:  Home Oxygen: Yes.     Oxygen Delivery: will be continued at skilled nursing facility  DISCHARGE LOCATION:   skilled nursing facility  r admission symptoms, develop shortness of breath, life threatening emergency, suicidal or homicidal thoughts you must seek medical attention immediately by calling 911 or calling your MD immediately  if symptoms less severe.  You Must read complete instructions/literature along with all the possible adverse reactions/side effects for all the Medicines you take and that have been prescribed to you. Take any new Medicines after you have completely understood and accpet all the possible adverse  reactions/side effects.   Please note  You were cared for by a hospitalist during your hospital stay. If you have any questions about your discharge medications or the care you received while you were in the hospital after you are discharged, you can call the unit and asked to speak with the hospitalist on call if the hospitalist that took care of you is not available. Once you are discharged, your primary care physician will handle any further medical issues. Please note that NO REFILLS for any discharge medications will be authorized once you are discharged, as it is imperative that you return to your primary care physician (or establish a relationship with a primary care physician if you do not have one) for your aftercare needs so that they can reassess your need for medications and monitor your lab values.     Today  Chief Complaint  Patient presents with  . Extremity Pain    patient is resting comfortably. Denies any chest pain or shortness of breath while resting   ROS:  CONSTITUTIONAL: Denies fevers, chills. Denies any fatigue, weakness.  EYES: Denies blurry vision, double vision, eye pain. EARS, NOSE, THROAT: Denies tinnitus, ear pain, hearing loss. RESPIRATORY: Denies cough, wheeze, shortness of breath.  CARDIOVASCULAR: Denies chest pain, palpitations, edema.  GASTROINTESTINAL: Denies nausea, vomiting, diarrhea, abdominal pain. Denies bright red blood per rectum. GENITOURINARY: Denies dysuria, hematuria. ENDOCRINE: Denies nocturia or thyroid problems. HEMATOLOGIC AND LYMPHATIC: Denies easy bruising or bleeding. SKIN: Denies rash or lesion. MUSCULOSKELETAL: Denies pain in neck, back, shoulder, knees, hips or arthritic symptoms.  NEUROLOGIC: Denies paralysis, paresthesias.  PSYCHIATRIC: Denies anxiety or depressive symptoms.   VITAL SIGNS:  Blood pressure 112/60, pulse 103, temperature 99.1 F (37.3 C), temperature source Oral, resp. rate 20, height 6\' 1"  (1.854 m), weight  71.532 kg (157 lb 11.2 oz), SpO2 99 %.  I/O:   Intake/Output Summary (Last 24 hours) at 11/19/14 1142 Last data filed at 11/19/14 0930  Gross per 24 hour  Intake    480 ml  Output   1900 ml  Net  -1420 ml    PHYSICAL EXAMINATION:  GENERAL:  69 y.o.-year-old patient lying in the bed with no acute distress.  EYES: Pupils equal, round, reactive to light and accommodation. No scleral icterus. Extraocular muscles intact.  HEENT: Head atraumatic, normocephalic. Oropharynx and nasopharynx clear.  NECK:  Supple, no jugular venous distention. No thyroid enlargement, no tenderness.  LUNGS: Normal breath sounds bilaterally, no wheezing, rales,rhonchi or crepitation. No use of accessory muscles of respiration.  CARDIOVASCULAR: S1, S2 normal. No murmurs, rubs, or gallops.  ABDOMEN: Soft, non-tender, non-distended. Bowel sounds present. No organomegaly or mass.  EXTREMITIES: No pedal edema, cyanosis, or clubbing.  NEUROLOGIC: Cranial nerves II through XII are intact. Muscle strength 5/5 in  all extremities. Sensation intact. Gait not checked.  PSYCHIATRIC: The patient is alert and oriented x 3.  SKIN: No obvious rash, lesion, or ulcer.   DATA REVIEW:   CBC  Recent Labs Lab 11/19/14 0503  WBC 18.0*  HGB 8.3*  HCT 24.9*  PLT 32*    Chemistries   Recent Labs Lab 11/17/14 0438 11/19/14 0503  NA 144 142  K 3.3* 3.2*  CL 113* 108  CO2 24 26  GLUCOSE 97 102*  BUN 24* 18  CREATININE 1.08 1.10  CALCIUM 8.4* 8.3*  MG 2.1  --     Cardiac Enzymes No results for input(s): TROPONINI in the last 168 hours.  Microbiology Results  Results for orders placed or performed during the hospital encounter of 11/08/14  Blood Culture (routine x 2)     Status: None   Collection Time: 11/08/14 10:21 PM  Result Value Ref Range Status   Specimen Description BLOOD  Final   Special Requests NONE  Final   Culture NO GROWTH 5 DAYS  Final   Report Status 11/13/2014 FINAL  Final  Blood Culture  (routine x 2)     Status: None   Collection Time: 11/08/14 10:22 PM  Result Value Ref Range Status   Specimen Description BLOOD  Final   Special Requests NONE  Final   Culture NO GROWTH 5 DAYS  Final   Report Status 11/13/2014 FINAL  Final  Urine culture     Status: None   Collection Time: 11/09/14  2:30 AM  Result Value Ref Range Status   Specimen Description URINE, RANDOM  Final   Special Requests NONE  Final   Culture NO GROWTH 1 DAY  Final   Report Status 11/10/2014 FINAL  Final    RADIOLOGY:  No results found.  EKG:   Orders placed or performed during the hospital encounter of 11/08/14  . EKG 12-Lead  . EKG 12-Lead  . EKG 12-Lead  . EKG 12-Lead  . EKG 12-Lead  . EKG 12-Lead      Management plans discussed with the patient, family and they are in agreement.  CODE STATUS:     Code Status Orders        Start     Ordered   11/09/14 1342  Do not attempt resuscitation (DNR)   Continuous    Question Answer Comment  In the event of cardiac or respiratory ARREST Do not call a "code blue"   In the event of cardiac or respiratory ARREST Do not perform Intubation, CPR, defibrillation or ACLS   In the event of cardiac or respiratory ARREST Use medication by any route, position, wound care, and other measures to relive pain and suffering. May use oxygen, suction and manual treatment of airway obstruction as needed for comfort.      11/09/14 1342      TOTAL TIME TAKING CARE OF THIS PATIENT: 27minutes.    @[facility] @  on 11/19/2014 at 11:42 AM  Between 7am to 6pm - Pager - [phone]  After 6pm go to [email or web address] - password [email or web address] [facility]  [facility]  Office  [phone]  CC: Primary care physician; No PCP Per Patient

## 2014-11-19 NOTE — Clinical Social Work Note (Signed)
Pt is ready for discharge today and will go to [facility]. Pt and family are agreeable to discharge plan. RN will call report and EMS will provide transportation. CSW is signing off as no further needs identified.   Darden Dates, MSW, LCSW Clinical Social Worker  [phone]

## 2014-11-19 NOTE — Plan of Care (Signed)
Problem: Discharge Progression Outcomes Goal: Other Discharge Outcomes/Goals Outcome: Progressing Plan of care progress to goals: Afebrile. VSS. No respiratory distress noted. Pt is calm and cooperative. Denies pain. Maalox given once for heartburn with imrovement.

## 2014-11-19 NOTE — Clinical Social Work Placement (Signed)
   CLINICAL SOCIAL WORK PLACEMENT  NOTE  Date:  11/19/2014  Patient Details  Name: Caleb Francis MRN: [id number] Date of Birth: [birth year]  Clinical Social Work is seeking post-discharge placement for this patient at the [place] level of care (*CSW will initial, date and re-position this form in  chart as items are completed):  Yes   Patient/family provided with [place] Work Department's list of facilities offering this level of care within the geographic area requested by the patient (or if unable, by the patient's family).  Yes   Patient/family informed of their freedom to choose among providers that offer the needed level of care, that participate in Medicare, Medicaid or managed care program needed by the patient, have an available bed and are willing to accept the patient.  Yes   Patient/family informed of [facility]'s ownership interest in [facility] and [facility], as well as of the fact that they are under no obligation to receive care at these facilities.  PASRR submitted to EDS on 11/14/14     PASRR number received on 11/14/14     Existing PASRR number confirmed on       FL2 transmitted to all facilities in geographic area requested by pt/family on 11/18/14     FL2 transmitted to all facilities within larger geographic area on       Patient informed that his/her managed care company has contracts with or will negotiate with certain facilities, including the following:        Yes   Patient/family informed of bed offers received.  Patient chooses bed at  [facility])     Physician recommends and patient chooses bed at  [facility])    Patient to be transferred to  [facility]) on 11/19/14.  Patient to be transferred to facility by  [facility])     Patient family notified on 11/19/14 of transfer.  Name of family member notified:   Andoni Busch, wife)     PHYSICIAN       Additional Comment:     _______________________________________________ Darden Dates, LCSW 11/19/2014, 1:59 PM

## 2014-11-21 ENCOUNTER — Inpatient Hospital Stay: Payer: Medicare Other | Admitting: Hematology and Oncology

## 2014-11-21 ENCOUNTER — Other Ambulatory Visit: Payer: Self-pay | Admitting: Hematology and Oncology

## 2014-11-22 ENCOUNTER — Other Ambulatory Visit: Payer: Self-pay | Admitting: Urology

## 2014-11-25 ENCOUNTER — Ambulatory Visit
Admission: RE | Admit: 2014-11-25 | Discharge: 2014-11-25 | Disposition: A | Discharge status: Home / Self Care | Payer: Medicare Other | Source: Ambulatory Visit | Attending: Hematology and Oncology | Admitting: Hematology and Oncology

## 2014-11-25 ENCOUNTER — Other Ambulatory Visit: Payer: Self-pay

## 2014-11-25 DIAGNOSIS — C8310 Mantle cell lymphoma, unspecified site: Secondary | ICD-10-CM

## 2014-11-25 DIAGNOSIS — Z9221 Personal history of antineoplastic chemotherapy: Secondary | ICD-10-CM | POA: Insufficient documentation

## 2014-11-25 MED ORDER — IOHEXOL 300 MG/ML  SOLN
100.0000 mL | Freq: Once | INTRAMUSCULAR | Status: AC | PRN
  Administered 2014-11-25: 75 mL via INTRAVENOUS

## 2014-11-26 ENCOUNTER — Ambulatory Visit: Payer: Medicare Other | Admitting: Hematology and Oncology

## 2014-11-26 ENCOUNTER — Other Ambulatory Visit: Payer: Medicare Other

## 2014-11-26 ENCOUNTER — Telehealth: Payer: Self-pay | Admitting: Hematology and Oncology

## 2014-11-26 ENCOUNTER — Encounter: Payer: Self-pay | Admitting: Family Medicine

## 2014-11-26 NOTE — Telephone Encounter (Signed)
[facility] called to say they had not known about his appt. On 6/14 and wanted to know if he could see md when he does tx in [place] on 6/15. Per Dr. Mike Gip I am adding appt @ 8:30 for lab, 8:45 for MD. If you need to contact Mr. Fera during this time he is at [facility]: [phone].

## 2014-11-27 ENCOUNTER — Inpatient Hospital Stay: Payer: Medicare Other | Attending: Hematology and Oncology | Admitting: Hematology and Oncology

## 2014-11-27 ENCOUNTER — Telehealth: Payer: Self-pay | Admitting: Pharmacist

## 2014-11-27 ENCOUNTER — Ambulatory Visit: Payer: Medicare Other | Admitting: Hematology and Oncology

## 2014-11-27 ENCOUNTER — Ambulatory Visit: Payer: Medicare Other

## 2014-11-27 ENCOUNTER — Encounter: Payer: Self-pay | Admitting: Hematology and Oncology

## 2014-11-27 ENCOUNTER — Other Ambulatory Visit: Payer: Medicare Other

## 2014-11-27 ENCOUNTER — Inpatient Hospital Stay: Payer: Medicare Other

## 2014-11-27 ENCOUNTER — Other Ambulatory Visit: Payer: Self-pay | Admitting: Hematology and Oncology

## 2014-11-27 VITALS — BP 97/54 | HR 91 | Temp 97.1°F | Resp 16 | Wt 150.4 lb

## 2014-11-27 DIAGNOSIS — I129 Hypertensive chronic kidney disease with stage 1 through stage 4 chronic kidney disease, or unspecified chronic kidney disease: Secondary | ICD-10-CM | POA: Insufficient documentation

## 2014-11-27 DIAGNOSIS — N189 Chronic kidney disease, unspecified: Secondary | ICD-10-CM | POA: Diagnosis not present

## 2014-11-27 DIAGNOSIS — C8310 Mantle cell lymphoma, unspecified site: Secondary | ICD-10-CM | POA: Diagnosis present

## 2014-11-27 DIAGNOSIS — E876 Hypokalemia: Secondary | ICD-10-CM | POA: Diagnosis not present

## 2014-11-27 DIAGNOSIS — Z5111 Encounter for antineoplastic chemotherapy: Secondary | ICD-10-CM | POA: Diagnosis not present

## 2014-11-27 DIAGNOSIS — Z809 Family history of malignant neoplasm, unspecified: Secondary | ICD-10-CM

## 2014-11-27 DIAGNOSIS — M069 Rheumatoid arthritis, unspecified: Secondary | ICD-10-CM

## 2014-11-27 DIAGNOSIS — Z79899 Other long term (current) drug therapy: Secondary | ICD-10-CM | POA: Diagnosis not present

## 2014-11-27 DIAGNOSIS — D649 Anemia, unspecified: Secondary | ICD-10-CM | POA: Diagnosis not present

## 2014-11-27 DIAGNOSIS — F17210 Nicotine dependence, cigarettes, uncomplicated: Secondary | ICD-10-CM | POA: Diagnosis not present

## 2014-11-27 DIAGNOSIS — N1330 Unspecified hydronephrosis: Secondary | ICD-10-CM | POA: Diagnosis not present

## 2014-11-27 DIAGNOSIS — M129 Arthropathy, unspecified: Secondary | ICD-10-CM

## 2014-11-27 DIAGNOSIS — I10 Essential (primary) hypertension: Secondary | ICD-10-CM | POA: Insufficient documentation

## 2014-11-27 DIAGNOSIS — C8590 Non-Hodgkin lymphoma, unspecified, unspecified site: Secondary | ICD-10-CM | POA: Insufficient documentation

## 2014-11-27 LAB — COMPREHENSIVE METABOLIC PANEL
ALT: 25 U/L (ref 17–63)
AST: 27 U/L (ref 15–41)
Albumin: 2.7 g/dL — ABNORMAL LOW (ref 3.5–5.0)
Alkaline Phosphatase: 247 U/L — ABNORMAL HIGH (ref 38–126)
Anion gap: 11 (ref 5–15)
BUN: 12 mg/dL (ref 6–20)
CO2: 21 mmol/L — ABNORMAL LOW (ref 22–32)
Calcium: 8.3 mg/dL — ABNORMAL LOW (ref 8.9–10.3)
Chloride: 105 mmol/L (ref 101–111)
Creatinine, Ser: 1.26 mg/dL — ABNORMAL HIGH (ref 0.61–1.24)
GFR calc Af Amer: >60 mL/min (ref >60)
GFR calc non Af Amer: 57 mL/min — ABNORMAL LOW (ref >60)
Glucose, Bld: 111 mg/dL — ABNORMAL HIGH (ref 65–99)
Potassium: 4.3 mmol/L (ref 3.5–5.1)
Sodium: 137 mmol/L (ref 135–145)
Total Bilirubin: 0.8 mg/dL (ref 0.3–1.2)
Total Protein: 6.6 g/dL (ref 6.5–8.1)

## 2014-11-27 LAB — URIC ACID: Uric Acid, Serum: 2.7 mg/dL — ABNORMAL LOW (ref 4.4–7.6)

## 2014-11-27 LAB — CBC
HCT: 22.9 % — ABNORMAL LOW (ref 40.0–52.0)
Hemoglobin: 7.6 g/dL — ABNORMAL LOW (ref 13.0–18.0)
MCH: 27.8 pg (ref 26.0–34.0)
MCHC: 32.9 g/dL (ref 32.0–36.0)
MCV: 84.3 fL (ref 80.0–100.0)
Platelets: 256 10*3/uL (ref 150–440)
RBC: 2.72 MIL/uL — ABNORMAL LOW (ref 4.40–5.90)
RDW: 19.1 % — ABNORMAL HIGH (ref 11.5–14.5)
WBC: 11.4 10*3/uL — ABNORMAL HIGH (ref 3.8–10.6)

## 2014-11-27 LAB — LACTATE DEHYDROGENASE: LDH: 274 U/L — ABNORMAL HIGH (ref 98–192)

## 2014-11-27 LAB — SAMPLE TO BLOOD BANK

## 2014-11-27 MED ORDER — SODIUM CHLORIDE 0.9 % IV SOLN: 375.0000 mg/m2 | Freq: Once | INTRAVENOUS | Status: DC

## 2014-11-27 MED ORDER — ACETAMINOPHEN 325 MG PO TABS
650.0000 mg | ORAL_TABLET | Freq: Once | ORAL | Status: AC
  Administered 2014-11-27: 650 mg via ORAL
  Filled 2014-11-27: qty 2

## 2014-11-27 MED ORDER — SODIUM CHLORIDE 0.9 % IV SOLN
100.0000 mg/m2 | Freq: Once | INTRAVENOUS | Status: AC
  Administered 2014-11-27: 190 mg via INTRAVENOUS
  Filled 2014-11-27: qty 9.5

## 2014-11-27 MED ORDER — SODIUM CHLORIDE 0.9 % IV SOLN
375.0000 mg/m2 | Freq: Once | INTRAVENOUS | Status: AC
  Administered 2014-11-27: 700 mg via INTRAVENOUS
  Filled 2014-11-27: qty 70

## 2014-11-27 MED ORDER — SODIUM CHLORIDE 0.9 % IV SOLN
Freq: Once | INTRAVENOUS | Status: AC
  Administered 2014-11-27: 16 mg via INTRAVENOUS
  Filled 2014-11-27: qty 8

## 2014-11-27 MED ORDER — SODIUM CHLORIDE 0.9 % IJ SOLN
10.0000 mL | INTRAMUSCULAR | Status: DC | PRN
  Administered 2014-11-27: 10 mL via INTRAVENOUS
  Filled 2014-11-27: qty 10

## 2014-11-27 MED ORDER — SODIUM CHLORIDE 0.9 % IV SOLN
INTRAVENOUS | Status: DC
  Administered 2014-11-27: 10:00:00 via INTRAVENOUS
  Filled 2014-11-27: qty 250

## 2014-11-27 MED ORDER — DIPHENHYDRAMINE HCL 50 MG/ML IJ SOLN
25.0000 mg | Freq: Once | INTRAMUSCULAR | Status: AC
  Administered 2014-11-27: 25 mg via INTRAVENOUS
  Filled 2014-11-27: qty 1

## 2014-11-27 MED ORDER — HEPARIN SOD (PORK) LOCK FLUSH 100 UNIT/ML IV SOLN
500.0000 [IU] | Freq: Once | INTRAVENOUS | Status: AC
  Administered 2014-11-27: 500 [IU] via INTRAVENOUS
  Filled 2014-11-27: qty 5

## 2014-11-27 NOTE — Progress Notes (Signed)
[facility] day:  11/27/2014  Chief Complaint: Caleb Francis is an 69 y.o. male with mantle cell lymphoma who is seen for assessment prior to cycle #2 RICE chemotherapy.  HPI: The patient was last seen in the medical oncology clinic on 11/06/2014.  At that time, he was seen for assessment prior to cycle #1 RICE chemotherapy.  He received Rituxan, 24 hours of ifosfamide, carboplatin, and 2 of 3 days of etoposide.  He was admitted on 11/08/2014 with worsening abdominal pain.  He missed his chemotherapy appointment.  Imaging studies on 11/08/2014 revealed rapidly enlarging enlarging lymph nodes and new right sided hydronephrosis. Clinical findings were worrisome for sepsis.   The patient was initially in the ICU and has transitioned to the medical oncology unit. His mental status fluctuated.  He was placed on broad spectrum antibiotics (vancomycin and Zosyn).  He was supported with GCSF, platetet and PRBC transfusions.  Blood cultures remained negative. During his admission, his palpable adenopathy began to improve.  Initial discussions were held regarding possible Hospice.  Because of his dramatic improvement, decision was made to transfer to skilled nursing facility.  At the time of discharge on 11/19/2014, his platelets were self sustaining.  He underwent chest, abdomen and pelvic CT scan on 11/25/2014.  Imaging studies revealed a slight positive response with generalized decrease in adenopathy in the abdomen and pelvis.  There were several lymph nodes unchanged.  There were a few lymph nodes slightly increased (2-5 mm).  Symptomatically, the patient feels good.  He denies any fevers or sweats.  He is unable to feel the lymph nodes in his neck.  Adenopathy under his arm has decreased.  He is voiding well.  Past Medical History  Diagnosis Date  . Arthritis   . Hypertension   . RA (rheumatoid arthritis)   . Anemia   . Cancer     lymphoma  .  History of nuclear stress test     a. 12/2013: low risk, no sig ischemia, no EKG changes, no artifact, EF 63%  . Chronic kidney disease     Past Surgical History  Procedure Laterality Date  . Back surgery    . Fracture surgery      ankle  . Portacath placement    . Cystoscopy w/ ureteral stent placement Left 10/24/2014    Procedure: CYSTOSCOPY WITH RETROGRADE PYELOGRAM/URETERAL STENT PLACEMENT;  Surgeon: Irine Seal, MD;  Location: [facility] ORS;  Service: Urology;  Laterality: Left;    Family History  Problem Relation Age of Onset  . Cancer Mother     breast  . Cancer Father     bone cancer    Social History:  reports that he quit smoking about 5 weeks ago. His smoking use included Cigarettes. He has a 30 pack-year smoking history. He does not have any smokeless tobacco history on file. He reports that he does not drink alcohol or use illicit drugs.  The patient is alone today.  Allergies: No Known Allergies  Current Medications: Current Outpatient Prescriptions  Medication Sig Dispense Refill  . acetaminophen (TYLENOL) 325 MG tablet Take 2 tablets (650 mg total) by mouth every 6 (six) hours as needed for mild pain or fever. 30 tablet 0  . allopurinol (ZYLOPRIM) 300 MG tablet Take 1 tablet (300 mg total) by mouth daily. 30 tablet 2  . alum & mag hydroxide-simeth (MAALOX/MYLANTA) 200-200-20 MG/5ML suspension Take 30 mLs by mouth every 6 (six) hours as needed for indigestion  or heartburn. 355 mL 0  . antiseptic oral rinse (CPC / CETYLPYRIDINIUM CHLORIDE 0.05%) 0.05 % LIQD solution 7 mLs by Mouth Rinse route 2 times daily at 12 noon and 4 pm. 50 mL 0  . docusate sodium (COLACE) 100 MG capsule Take 1 capsule (100 mg total) by mouth 2 (two) times daily. 10 capsule 0  . feeding supplement, ENSURE ENLIVE, (ENSURE ENLIVE) LIQD Take 237 mLs by mouth 2 (two) times daily between meals. 937 mL 12  . folic acid (FOLVITE) 1 MG tablet Take 1 mg by mouth.    . magnesium oxide (MAG-OX) 400 (241.3 MG) MG  tablet Take 1 tablet (400 mg total) by mouth 2 (two) times daily. 30 tablet 1  . metoprolol tartrate (LOPRESSOR) 25 MG tablet Take 1 tablet (25 mg total) by mouth 2 (two) times daily. 60 tablet 0  . oxybutynin (DITROPAN) 5 MG tablet Take 1 tablet (5 mg total) by mouth every 8 (eight) hours as needed for bladder spasms (spasms or stent related pain). 30 tablet 0  . oxyCODONE-acetaminophen (PERCOCET/ROXICET) 5-325 MG per tablet Take 1-2 tablets by mouth every 4 (four) hours as needed for moderate pain. 30 tablet 0  . potassium chloride (K-DUR) 10 MEQ tablet Take 4 tablets (40 mEq total) by mouth 2 (two) times daily. 20 tablet 0  . senna-docusate (SENOKOT-S) 8.6-50 MG per tablet Take 2 tablets by mouth 2 (two) times daily.    [name] spironolactone (ALDACTONE) 25 MG tablet      No current facility-administered medications for this visit.   Facility-Administered Medications Ordered in Other Visits  Medication Dose Route Frequency Provider Last Rate Last Dose  . 0.9 %  sodium chloride infusion   Intravenous Continuous Lequita Asal, MD 20 mL/hr at 11/06/14 1010 20 mL/hr at 11/06/14 1010  . 0.9 %  sodium chloride infusion   Intravenous Continuous Lequita Asal, MD   Stopped at 11/27/14 1400  . morphine 2 MG/ML injection 2 mg  2 mg Intravenous Q2H PRN Lequita Asal, MD   2 mg at 11/05/14 1422  . sodium chloride 0.9 % injection 10 mL  10 mL Intracatheter PRN Lequita Asal, MD   10 mL at 11/06/14 1011  . sodium chloride 0.9 % injection 10 mL  10 mL Intravenous PRN Lequita Asal, MD   10 mL at 11/27/14 0840    Review of Systems:  GENERAL:  Feels good. No fevers, sweats or weight loss. PERFORMANCE STATUS (ECOG):  1-2 HEENT:  No visual changes, runny nose, sore throat, mouth sores or tenderness. Lungs: No shortness of breath or cough.  No hemoptysis. Cardiac:  No chest pain, palpitations, orthopnea, or PND. GI:  No nausea, vomiting, diarrhea, constipation, melena or hematochezia. GU:   No urgency, frequency, dysuria, or hematuria. Musculoskeletal:  No back pain.  No joint pain.  No muscle tenderness. Extremities:  No pain or swelling. Skin:  No rashes or skin changes. Neuro:  No headache, numbness or weakness, balance or coordination issues. Endocrine:  No diabetes, thyroid issues, hot flashes or night sweats. Psych:  No mood changes, depression or anxiety. Pain:  No focal pain. Review of systems:  All other systems reviewed and found to be negative.   Physical Exam: Blood pressure 97/54, pulse 91, temperature 97.1 F (36.2 C), temperature source Tympanic, resp. rate 16, weight 150 lb 5.7 oz (68.2 kg), SpO2 97 %.  GENERAL:  Well developed, well nourished, sitting comfortably in the exam room in no acute distress.  MENTAL STATUS:  Alert and oriented to person, place and time. HEAD:  Short graying hair.  Normocephalic, atraumatic, face symmetric, no Cushingoid features. EYES:  Brown eyes.  Pupils equal round and reactive to light and accomodation.  No conjunctivitis or scleral icterus. ENT:  Oropharynx clear without lesion.  Tongue normal. Mucous membranes moist.  RESPIRATORY:  Clear to auscultation without rales, wheezes or rhonchi. CARDIOVASCULAR:  Regular rate and rhythm without murmur, rub or gallop. ABDOMEN:  Soft, non-tender, with active bowel sounds, and no hepatosplenomegaly.  No masses. SKIN:  No rashes, ulcers or lesions. EXTREMITIES: No lower extremity edema.  No palpable cords. LYMPH NODES: No palpable cervical adenopathy.  Left axillary node 2.5-3 cm.  Left inguinal node 2-2.5 cm.  No supraclavicular adenopathy. NEUROLOGICAL: Unremarkable. PSYCH:  Appropriate.   Infusion on 11/27/2014  Component Date Value Ref Range Status  . WBC 11/27/2014 11.4* 3.8 - 10.6 K/uL Final  . RBC 11/27/2014 2.72* 4.40 - 5.90 MIL/uL Final  . Hemoglobin 11/27/2014 7.6* 13.0 - 18.0 g/dL Final  . HCT 11/27/2014 22.9* 40.0 - 52.0 % Final  . MCV 11/27/2014 84.3  80.0 - 100.0 fL  Final  . MCH 11/27/2014 27.8  26.0 - 34.0 pg Final  . MCHC 11/27/2014 32.9  32.0 - 36.0 g/dL Final  . RDW 11/27/2014 19.1* 11.5 - 14.5 % Final  . Platelets 11/27/2014 256  150 - 440 K/uL Final  . Sodium 11/27/2014 137  135 - 145 mmol/L Final  . Potassium 11/27/2014 4.3  3.5 - 5.1 mmol/L Final  . Chloride 11/27/2014 105  101 - 111 mmol/L Final  . CO2 11/27/2014 21* 22 - 32 mmol/L Final  . Glucose, Bld 11/27/2014 111* 65 - 99 mg/dL Final  . BUN 11/27/2014 12  6 - 20 mg/dL Final  . Creatinine, Ser 11/27/2014 1.26* 0.61 - 1.24 mg/dL Final  . Calcium 11/27/2014 8.3* 8.9 - 10.3 mg/dL Final  . Total Protein 11/27/2014 6.6  6.5 - 8.1 g/dL Final  . Albumin 11/27/2014 2.7* 3.5 - 5.0 g/dL Final  . AST 11/27/2014 27  15 - 41 U/L Final  . ALT 11/27/2014 25  17 - 63 U/L Final  . Alkaline Phosphatase 11/27/2014 247* 38 - 126 U/L Final  . Total Bilirubin 11/27/2014 0.8  0.3 - 1.2 mg/dL Final  . GFR calc non Af Amer 11/27/2014 57* >60 mL/min Final  . GFR calc Af Amer 11/27/2014 >60  >60 mL/min Final   Comment: (NOTE) The eGFR has been calculated using the CKD EPI equation. This calculation has not been validated in all clinical situations. eGFR's persistently <60 mL/min signify possible Chronic Kidney Disease.   . Anion gap 11/27/2014 11  5 - 15 Final  . LDH 11/27/2014 274* 98 - 192 U/L Final  . Uric Acid, Serum 11/27/2014 2.7* 4.4 - 7.6 mg/dL Final  . Blood Bank Specimen 11/27/2014 SAMPLE AVAILABLE FOR TESTING   Final  . Sample Expiration 11/27/2014 11/30/2014   Final    Assessment:  CAEDON BOND is an 69 y.o. male with a history of stage IIIB mantle cell lymphoma.  He did not have a baseline bone marrow.  He presented with extensive denopathy.  Right cervical lymph node biopsy on 03/13/2013 confirmed mantle cell lymphoma.   He received 6 cycles of Bendamustine/Rituxan from 03/15/2013 - 07/13/2013.  He received 2 additional infusions of Rituxan (08/10/2013 and 10/01/2013).  He had a partial  response (75% decrease in adenopathy).  He then received 6 cycles of RCHOP from  10/22/2013 - 02/11/2014.  He had a dose adjustment in vincristine because of constipation and lower extremity neuropathy.    PET/CT on 03/14/2014 demonstrated a partial response with further significant but incomplete metabolic response to therapy in the neck, chest, abdomen, and pelvis.  Chest, abdomen, and pelvic CT scan on 05/24/2014 revealed stable mild axillary adenopathy, no mediastinal or pulmonary masses, mild retroperitoneal and iliac pelvic adenopathy (1.2 cm) which had decreased in volume slightly.  He has a history of chronic hypokalemia of unclear etiology.  He has renal insufficiency.  Renal ultrasound on 08/20/2014 revealed no hydronephrosis.  He began Ibrutinib in 02/2014.  PET scan on 09/16/2014 reveals early progressive disease (size, numer and hypermetabolism of lymph nodes).    He received cycle #1 RICE chemotherapy on 11/06/2014.  He missed day 3 Etoposide secondary to worsening abdominal pain and altered mental status.  Abdominal and pelvic CT scan on 11/08/2014 revealed rapidly progressive disease.  With aggressive support during his hospitalization, he was discharged home with improvement in symptoms and decreasing adenopathy.  Chest, abdomen, and pelvic CT scan on 11/25/2014 revealed a slight positive response with generalized decrease in adenopathy in the abdomen and pelvis.  There were several lymph nodes unchanged.  There were a few lymph nodes slightly increased (2-5 mm).  Symptomatically, he is doing well.  He denies any abdominal symptoms.  Exam reveals decreased palpable adenopathy.  He has progressive anemia.  Plan: 1. Labs today:  CBC with diff, CMP, LDH, uric acid. 2. Day 1 RICE chemotherapy today (Rituxan and etoposide) 3. Add type and cross.  Discuss with patient plan to transfuse on 11/29/2014 (Friday) with last day of chemotherapy in [place]. 4. RTC tomorrow and Friday in  [place] for labs (BMP and uric acid) and chemotherapy. 5. Discuss with patient plan for follow-up at [facility] to discuss transplant option. 6. RTC on 12/03/2014 for MD assessment, labs (CBC with diff, BMP, uric acid, type and screen) +/- transfusion  Lequita Asal, MD  11/27/2014, 4:59 PM

## 2014-11-27 NOTE — Telephone Encounter (Signed)
MD aware of HGB level. Will continue with treatment today and patient will get blood later this week.

## 2014-11-28 ENCOUNTER — Other Ambulatory Visit: Payer: Self-pay | Admitting: Hematology and Oncology

## 2014-11-28 ENCOUNTER — Inpatient Hospital Stay: Payer: Medicare Other

## 2014-11-28 ENCOUNTER — Ambulatory Visit: Payer: Medicare Other

## 2014-11-28 ENCOUNTER — Other Ambulatory Visit: Payer: Medicare Other

## 2014-11-28 DIAGNOSIS — C8310 Mantle cell lymphoma, unspecified site: Secondary | ICD-10-CM

## 2014-11-28 LAB — BASIC METABOLIC PANEL
Anion gap: 7 (ref 5–15)
BUN: 22 mg/dL — ABNORMAL HIGH (ref 6–20)
CO2: 21 mmol/L — ABNORMAL LOW (ref 22–32)
Calcium: 8 mg/dL — ABNORMAL LOW (ref 8.9–10.3)
Chloride: 108 mmol/L (ref 101–111)
Creatinine, Ser: 1.24 mg/dL (ref 0.61–1.24)
GFR calc Af Amer: >60 mL/min (ref >60)
GFR calc non Af Amer: 58 mL/min — ABNORMAL LOW (ref >60)
Glucose, Bld: 180 mg/dL — ABNORMAL HIGH (ref 65–99)
Potassium: 4.4 mmol/L (ref 3.5–5.1)
Sodium: 136 mmol/L (ref 135–145)

## 2014-11-28 LAB — CBC WITH DIFFERENTIAL/PLATELET
Basophils Absolute: 0 10*3/uL (ref 0–0.1)
Basophils Relative: 0 %
Eosinophils Absolute: 0 10*3/uL (ref 0–0.7)
Eosinophils Relative: 0 %
HCT: 21.7 % — ABNORMAL LOW (ref 40.0–52.0)
Hemoglobin: 7.1 g/dL — ABNORMAL LOW (ref 13.0–18.0)
Lymphocytes Relative: 7 %
Lymphs Abs: 0.8 10*3/uL — ABNORMAL LOW (ref 1.0–3.6)
MCH: 27.5 pg (ref 26.0–34.0)
MCHC: 32.5 g/dL (ref 32.0–36.0)
MCV: 84.8 fL (ref 80.0–100.0)
Monocytes Absolute: 0.6 10*3/uL (ref 0.2–1.0)
Monocytes Relative: 5 %
Neutro Abs: 10.5 10*3/uL — ABNORMAL HIGH (ref 1.4–6.5)
Neutrophils Relative %: 88 %
Platelets: 284 10*3/uL (ref 150–440)
RBC: 2.56 MIL/uL — ABNORMAL LOW (ref 4.40–5.90)
RDW: 19.7 % — ABNORMAL HIGH (ref 11.5–14.5)
WBC: 11.9 10*3/uL — ABNORMAL HIGH (ref 3.8–10.6)

## 2014-11-28 LAB — PREPARE RBC (CROSSMATCH)

## 2014-11-28 LAB — URIC ACID: Uric Acid, Serum: 3.1 mg/dL — ABNORMAL LOW (ref 4.4–7.6)

## 2014-11-28 MED ORDER — SODIUM CHLORIDE 0.9 % IV SOLN
Freq: Once | INTRAVENOUS | Status: AC
  Administered 2014-11-28: 14:00:00 via INTRAVENOUS
  Filled 2014-11-28: qty 191

## 2014-11-28 MED ORDER — SODIUM CHLORIDE 0.9 % IV SOLN
431.0000 mg | Freq: Once | INTRAVENOUS | Status: AC
  Administered 2014-11-28: 430 mg via INTRAVENOUS
  Filled 2014-11-28: qty 43

## 2014-11-28 MED ORDER — SODIUM CHLORIDE 0.9 % IV SOLN
INTRAVENOUS | Status: AC
  Administered 2014-11-28: 11:00:00 via INTRAVENOUS
  Filled 2014-11-28: qty 1000

## 2014-11-28 MED ORDER — SODIUM CHLORIDE 0.9 % IV SOLN
100.0000 mg/m2 | Freq: Once | INTRAVENOUS | Status: AC
  Administered 2014-11-28: 190 mg via INTRAVENOUS
  Filled 2014-11-28: qty 9.5

## 2014-11-28 MED ORDER — ONDANSETRON HCL 4 MG/2ML IJ SOLN
Freq: Once | INTRAMUSCULAR | Status: AC
  Administered 2014-11-28: 16 mg via INTRAVENOUS
  Filled 2014-11-28: qty 8

## 2014-11-28 MED ORDER — SODIUM CHLORIDE 0.9 % IV SOLN
400.0000 mg/m2 | Freq: Once | INTRAVENOUS | Status: AC
  Administered 2014-11-28: 750 mg via INTRAVENOUS
  Filled 2014-11-28: qty 7.5

## 2014-11-29 ENCOUNTER — Emergency Department: Payer: Medicare Other

## 2014-11-29 ENCOUNTER — Inpatient Hospital Stay
Admission: EM | Admit: 2014-11-29 | Discharge: 2015-01-24 | DRG: 871 | Disposition: A | Discharge status: Skilled Nursing Facility | Payer: Medicare Other | Attending: Internal Medicine | Admitting: Internal Medicine

## 2014-11-29 ENCOUNTER — Other Ambulatory Visit: Payer: Self-pay

## 2014-11-29 ENCOUNTER — Other Ambulatory Visit: Payer: Self-pay | Admitting: Hematology and Oncology

## 2014-11-29 ENCOUNTER — Ambulatory Visit: Payer: Medicare Other

## 2014-11-29 ENCOUNTER — Inpatient Hospital Stay: Payer: Medicare Other

## 2014-11-29 VITALS — BP 116/68 | HR 88 | Resp 20

## 2014-11-29 DIAGNOSIS — Z7952 Long term (current) use of systemic steroids: Secondary | ICD-10-CM

## 2014-11-29 DIAGNOSIS — N2581 Secondary hyperparathyroidism of renal origin: Secondary | ICD-10-CM | POA: Diagnosis present

## 2014-11-29 DIAGNOSIS — R140 Abdominal distension (gaseous): Secondary | ICD-10-CM | POA: Diagnosis not present

## 2014-11-29 DIAGNOSIS — M7989 Other specified soft tissue disorders: Secondary | ICD-10-CM

## 2014-11-29 DIAGNOSIS — K922 Gastrointestinal hemorrhage, unspecified: Secondary | ICD-10-CM | POA: Diagnosis present

## 2014-11-29 DIAGNOSIS — Z7401 Bed confinement status: Secondary | ICD-10-CM | POA: Diagnosis not present

## 2014-11-29 DIAGNOSIS — T8092XA Unspecified transfusion reaction, initial encounter: Secondary | ICD-10-CM | POA: Diagnosis present

## 2014-11-29 DIAGNOSIS — D696 Thrombocytopenia, unspecified: Secondary | ICD-10-CM

## 2014-11-29 DIAGNOSIS — C8310 Mantle cell lymphoma, unspecified site: Secondary | ICD-10-CM

## 2014-11-29 DIAGNOSIS — N186 End stage renal disease: Secondary | ICD-10-CM

## 2014-11-29 DIAGNOSIS — K5669 Other intestinal obstruction: Secondary | ICD-10-CM | POA: Diagnosis not present

## 2014-11-29 DIAGNOSIS — I82612 Acute embolism and thrombosis of superficial veins of left upper extremity: Secondary | ICD-10-CM | POA: Diagnosis present

## 2014-11-29 DIAGNOSIS — K5641 Fecal impaction: Secondary | ICD-10-CM | POA: Diagnosis present

## 2014-11-29 DIAGNOSIS — M1990 Unspecified osteoarthritis, unspecified site: Secondary | ICD-10-CM | POA: Diagnosis present

## 2014-11-29 DIAGNOSIS — Z803 Family history of malignant neoplasm of breast: Secondary | ICD-10-CM

## 2014-11-29 DIAGNOSIS — D649 Anemia, unspecified: Secondary | ICD-10-CM

## 2014-11-29 DIAGNOSIS — C8590 Non-Hodgkin lymphoma, unspecified, unspecified site: Secondary | ICD-10-CM | POA: Diagnosis not present

## 2014-11-29 DIAGNOSIS — Z992 Dependence on renal dialysis: Secondary | ICD-10-CM | POA: Diagnosis not present

## 2014-11-29 DIAGNOSIS — R05 Cough: Secondary | ICD-10-CM

## 2014-11-29 DIAGNOSIS — N289 Disorder of kidney and ureter, unspecified: Secondary | ICD-10-CM

## 2014-11-29 DIAGNOSIS — Z95828 Presence of other vascular implants and grafts: Secondary | ICD-10-CM | POA: Diagnosis not present

## 2014-11-29 DIAGNOSIS — A419 Sepsis, unspecified organism: Secondary | ICD-10-CM

## 2014-11-29 DIAGNOSIS — Y848 Other medical procedures as the cause of abnormal reaction of the patient, or of later complication, without mention of misadventure at the time of the procedure: Secondary | ICD-10-CM | POA: Diagnosis present

## 2014-11-29 DIAGNOSIS — F4489 Other dissociative and conversion disorders: Secondary | ICD-10-CM

## 2014-11-29 DIAGNOSIS — Z681 Body mass index (BMI) 19 or less, adult: Secondary | ICD-10-CM | POA: Diagnosis not present

## 2014-11-29 DIAGNOSIS — A412 Sepsis due to unspecified staphylococcus: Principal | ICD-10-CM | POA: Diagnosis present

## 2014-11-29 DIAGNOSIS — Z960 Presence of urogenital implants: Secondary | ICD-10-CM | POA: Diagnosis not present

## 2014-11-29 DIAGNOSIS — R531 Weakness: Secondary | ICD-10-CM | POA: Diagnosis not present

## 2014-11-29 DIAGNOSIS — Z87891 Personal history of nicotine dependence: Secondary | ICD-10-CM | POA: Diagnosis not present

## 2014-11-29 DIAGNOSIS — D631 Anemia in chronic kidney disease: Secondary | ICD-10-CM | POA: Diagnosis present

## 2014-11-29 DIAGNOSIS — Z515 Encounter for palliative care: Secondary | ICD-10-CM

## 2014-11-29 DIAGNOSIS — Z809 Family history of malignant neoplasm, unspecified: Secondary | ICD-10-CM

## 2014-11-29 DIAGNOSIS — D61810 Antineoplastic chemotherapy induced pancytopenia: Secondary | ICD-10-CM | POA: Diagnosis present

## 2014-11-29 DIAGNOSIS — T451X5A Adverse effect of antineoplastic and immunosuppressive drugs, initial encounter: Secondary | ICD-10-CM | POA: Diagnosis present

## 2014-11-29 DIAGNOSIS — L8990 Pressure ulcer of unspecified site, unspecified stage: Secondary | ICD-10-CM | POA: Insufficient documentation

## 2014-11-29 DIAGNOSIS — L8992 Pressure ulcer of unspecified site, stage 2: Secondary | ICD-10-CM | POA: Diagnosis present

## 2014-11-29 DIAGNOSIS — M069 Rheumatoid arthritis, unspecified: Secondary | ICD-10-CM

## 2014-11-29 DIAGNOSIS — Z79899 Other long term (current) drug therapy: Secondary | ICD-10-CM

## 2014-11-29 DIAGNOSIS — B961 Klebsiella pneumoniae [K. pneumoniae] as the cause of diseases classified elsewhere: Secondary | ICD-10-CM | POA: Diagnosis present

## 2014-11-29 DIAGNOSIS — Z9221 Personal history of antineoplastic chemotherapy: Secondary | ICD-10-CM

## 2014-11-29 DIAGNOSIS — N189 Chronic kidney disease, unspecified: Secondary | ICD-10-CM

## 2014-11-29 DIAGNOSIS — R400 Somnolence: Secondary | ICD-10-CM | POA: Diagnosis not present

## 2014-11-29 DIAGNOSIS — R609 Edema, unspecified: Secondary | ICD-10-CM

## 2014-11-29 DIAGNOSIS — R0602 Shortness of breath: Secondary | ICD-10-CM

## 2014-11-29 DIAGNOSIS — R059 Cough, unspecified: Secondary | ICD-10-CM

## 2014-11-29 DIAGNOSIS — N170 Acute kidney failure with tubular necrosis: Secondary | ICD-10-CM | POA: Diagnosis not present

## 2014-11-29 DIAGNOSIS — D6489 Other specified anemias: Secondary | ICD-10-CM

## 2014-11-29 DIAGNOSIS — B49 Unspecified mycosis: Secondary | ICD-10-CM | POA: Diagnosis present

## 2014-11-29 DIAGNOSIS — G9341 Metabolic encephalopathy: Secondary | ICD-10-CM | POA: Diagnosis present

## 2014-11-29 DIAGNOSIS — I959 Hypotension, unspecified: Secondary | ICD-10-CM | POA: Diagnosis present

## 2014-11-29 DIAGNOSIS — Z66 Do not resuscitate: Secondary | ICD-10-CM | POA: Diagnosis present

## 2014-11-29 DIAGNOSIS — F17210 Nicotine dependence, cigarettes, uncomplicated: Secondary | ICD-10-CM

## 2014-11-29 DIAGNOSIS — R64 Cachexia: Secondary | ICD-10-CM | POA: Diagnosis present

## 2014-11-29 DIAGNOSIS — I255 Ischemic cardiomyopathy: Secondary | ICD-10-CM | POA: Diagnosis present

## 2014-11-29 DIAGNOSIS — Z8489 Family history of other specified conditions: Secondary | ICD-10-CM | POA: Diagnosis not present

## 2014-11-29 DIAGNOSIS — Z789 Other specified health status: Secondary | ICD-10-CM

## 2014-11-29 DIAGNOSIS — F28 Other psychotic disorder not due to a substance or known physiological condition: Secondary | ICD-10-CM | POA: Diagnosis present

## 2014-11-29 DIAGNOSIS — N1330 Unspecified hydronephrosis: Secondary | ICD-10-CM | POA: Diagnosis not present

## 2014-11-29 DIAGNOSIS — R410 Disorientation, unspecified: Secondary | ICD-10-CM

## 2014-11-29 DIAGNOSIS — Z4659 Encounter for fitting and adjustment of other gastrointestinal appliance and device: Secondary | ICD-10-CM

## 2014-11-29 DIAGNOSIS — R627 Adult failure to thrive: Secondary | ICD-10-CM | POA: Diagnosis present

## 2014-11-29 DIAGNOSIS — I120 Hypertensive chronic kidney disease with stage 5 chronic kidney disease or end stage renal disease: Secondary | ICD-10-CM | POA: Diagnosis present

## 2014-11-29 DIAGNOSIS — Z0189 Encounter for other specified special examinations: Secondary | ICD-10-CM

## 2014-11-29 DIAGNOSIS — R000 Tachycardia, unspecified: Secondary | ICD-10-CM | POA: Diagnosis present

## 2014-11-29 DIAGNOSIS — K598 Other specified functional intestinal disorders: Secondary | ICD-10-CM

## 2014-11-29 DIAGNOSIS — K599 Functional intestinal disorder, unspecified: Secondary | ICD-10-CM | POA: Diagnosis not present

## 2014-11-29 DIAGNOSIS — E43 Unspecified severe protein-calorie malnutrition: Secondary | ICD-10-CM | POA: Insufficient documentation

## 2014-11-29 DIAGNOSIS — A4189 Other specified sepsis: Secondary | ICD-10-CM | POA: Diagnosis present

## 2014-11-29 DIAGNOSIS — R4182 Altered mental status, unspecified: Secondary | ICD-10-CM

## 2014-11-29 DIAGNOSIS — G8190 Hemiplegia, unspecified affecting unspecified side: Secondary | ICD-10-CM

## 2014-11-29 DIAGNOSIS — K567 Ileus, unspecified: Secondary | ICD-10-CM

## 2014-11-29 DIAGNOSIS — K5981 Ogilvie syndrome: Secondary | ICD-10-CM

## 2014-11-29 DIAGNOSIS — M059 Rheumatoid arthritis with rheumatoid factor, unspecified: Secondary | ICD-10-CM | POA: Diagnosis present

## 2014-11-29 DIAGNOSIS — R6521 Severe sepsis with septic shock: Secondary | ICD-10-CM | POA: Diagnosis present

## 2014-11-29 DIAGNOSIS — N390 Urinary tract infection, site not specified: Secondary | ICD-10-CM | POA: Diagnosis not present

## 2014-11-29 DIAGNOSIS — R0989 Other specified symptoms and signs involving the circulatory and respiratory systems: Secondary | ICD-10-CM

## 2014-11-29 DIAGNOSIS — K668 Other specified disorders of peritoneum: Secondary | ICD-10-CM

## 2014-11-29 DIAGNOSIS — R509 Fever, unspecified: Secondary | ICD-10-CM

## 2014-11-29 DIAGNOSIS — I129 Hypertensive chronic kidney disease with stage 1 through stage 4 chronic kidney disease, or unspecified chronic kidney disease: Secondary | ICD-10-CM

## 2014-11-29 DIAGNOSIS — R079 Chest pain, unspecified: Secondary | ICD-10-CM | POA: Diagnosis present

## 2014-11-29 DIAGNOSIS — I8290 Acute embolism and thrombosis of unspecified vein: Secondary | ICD-10-CM

## 2014-11-29 HISTORY — DX: Sepsis, unspecified organism: A41.9

## 2014-11-29 HISTORY — DX: Mantle cell lymphoma, unspecified site: C83.10

## 2014-11-29 LAB — COMPREHENSIVE METABOLIC PANEL
ALBUMIN: 2.4 g/dL — AB (ref 3.5–5.0)
ALT: 45 U/L (ref 17–63)
ALT: 42 U/L (ref 17–63)
AST: 44 U/L — ABNORMAL HIGH (ref 15–41)
AST: 43 U/L — AB (ref 15–41)
Albumin: 2.4 g/dL — ABNORMAL LOW (ref 3.5–5.0)
Alkaline Phosphatase: 283 U/L — ABNORMAL HIGH (ref 38–126)
Alkaline Phosphatase: 268 U/L — ABNORMAL HIGH (ref 38–126)
Anion gap: 10 (ref 5–15)
Anion gap: 8 (ref 5–15)
BUN: 24 mg/dL — ABNORMAL HIGH (ref 6–20)
BUN: 25 mg/dL — ABNORMAL HIGH (ref 6–20)
CHLORIDE: 112 mmol/L — AB (ref 101–111)
CO2: 19 mmol/L — ABNORMAL LOW (ref 22–32)
CO2: 19 mmol/L — ABNORMAL LOW (ref 22–32)
Calcium: 8.4 mg/dL — ABNORMAL LOW (ref 8.9–10.3)
Calcium: 8.7 mg/dL — ABNORMAL LOW (ref 8.9–10.3)
Chloride: 108 mmol/L (ref 101–111)
Creatinine, Ser: 1.24 mg/dL (ref 0.61–1.24)
Creatinine, Ser: 1.21 mg/dL (ref 0.61–1.24)
GFR calc Af Amer: >60 mL/min (ref >60)
GFR calc Af Amer: >60 mL/min (ref >60)
GFR calc non Af Amer: 58 mL/min — ABNORMAL LOW (ref >60)
GFR calc non Af Amer: >60 mL/min (ref >60)
Glucose, Bld: 139 mg/dL — ABNORMAL HIGH (ref 65–99)
Glucose, Bld: 127 mg/dL — ABNORMAL HIGH (ref 65–99)
Potassium: 4.8 mmol/L (ref 3.5–5.1)
Potassium: 4.7 mmol/L (ref 3.5–5.1)
Sodium: 137 mmol/L (ref 135–145)
Sodium: 139 mmol/L (ref 135–145)
Total Bilirubin: 0.5 mg/dL (ref 0.3–1.2)
Total Bilirubin: 0.6 mg/dL (ref 0.3–1.2)
Total Protein: 6.7 g/dL (ref 6.5–8.1)
Total Protein: 6.4 g/dL — ABNORMAL LOW (ref 6.5–8.1)

## 2014-11-29 LAB — PROTIME-INR
INR: 1.33
Prothrombin Time: 16.7 seconds — ABNORMAL HIGH (ref 11.4–15.0)

## 2014-11-29 LAB — CBC WITH DIFFERENTIAL/PLATELET
Basophils Absolute: 0 10*3/uL (ref 0–0.1)
Basophils Relative: 0 %
Eosinophils Absolute: 0 10*3/uL (ref 0–0.7)
Eosinophils Relative: 0 %
HEMATOCRIT: 21.9 % — AB (ref 40.0–52.0)
Hemoglobin: 7 g/dL — ABNORMAL LOW (ref 13.0–18.0)
LYMPHS PCT: 4 %
Lymphs Abs: 0.4 10*3/uL — ABNORMAL LOW (ref 1.0–3.6)
MCH: 27.5 pg (ref 26.0–34.0)
MCHC: 31.8 g/dL — ABNORMAL LOW (ref 32.0–36.0)
MCV: 86.5 fL (ref 80.0–100.0)
Monocytes Absolute: 0.2 10*3/uL (ref 0.2–1.0)
Monocytes Relative: 2 %
Neutro Abs: 8.9 10*3/uL — ABNORMAL HIGH (ref 1.4–6.5)
Neutrophils Relative %: 94 %
Platelets: 288 10*3/uL (ref 150–440)
RBC: 2.54 MIL/uL — AB (ref 4.40–5.90)
RDW: 20.2 % — AB (ref 11.5–14.5)
WBC: 9.5 10*3/uL (ref 3.8–10.6)

## 2014-11-29 LAB — TROPONIN I
Troponin I: 0.06 ng/mL — ABNORMAL HIGH (ref <0.031)
Troponin I: 0.06 ng/mL — ABNORMAL HIGH (ref <0.031)
Troponin I: 0.05 ng/mL — ABNORMAL HIGH (ref <0.031)

## 2014-11-29 LAB — BLOOD GAS, ARTERIAL
ALLENS TEST (PASS/FAIL): POSITIVE — AB
Acid-base deficit: 6.8 mmol/L — ABNORMAL HIGH (ref 0.0–2.0)
Bicarbonate: 17.5 mEq/L — ABNORMAL LOW (ref 21.0–28.0)
FIO2: 0.21 %
O2 Saturation: 96.9 %
Patient temperature: 37
pCO2 arterial: 31 mmHg — ABNORMAL LOW (ref 32.0–48.0)
pH, Arterial: 7.36 (ref 7.350–7.450)
pO2, Arterial: 93 mmHg (ref 83.0–108.0)

## 2014-11-29 LAB — URINALYSIS COMPLETE WITH MICROSCOPIC (ARMC ONLY)
Bilirubin Urine: NEGATIVE
Glucose, UA: NEGATIVE mg/dL
Nitrite: NEGATIVE
Protein, ur: NEGATIVE mg/dL
Specific Gravity, Urine: 1.011 (ref 1.005–1.030)
Squamous Epithelial / LPF: NONE SEEN
pH: 5 (ref 5.0–8.0)

## 2014-11-29 LAB — BASIC METABOLIC PANEL
Anion gap: 10 (ref 5–15)
BUN: 25 mg/dL — ABNORMAL HIGH (ref 6–20)
CO2: 19 mmol/L — ABNORMAL LOW (ref 22–32)
Calcium: 8.5 mg/dL — ABNORMAL LOW (ref 8.9–10.3)
Chloride: 108 mmol/L (ref 101–111)
Creatinine, Ser: 1.2 mg/dL (ref 0.61–1.24)
GFR calc Af Amer: >60 mL/min (ref >60)
GFR calc non Af Amer: >60 mL/min (ref >60)
Glucose, Bld: 141 mg/dL — ABNORMAL HIGH (ref 65–99)
Potassium: 4.8 mmol/L (ref 3.5–5.1)
Sodium: 137 mmol/L (ref 135–145)

## 2014-11-29 LAB — LACTIC ACID, PLASMA
LACTIC ACID, VENOUS: 2.6 mmol/L — AB (ref 0.5–2.0)
Lactic Acid, Venous: 2.3 mmol/L (ref 0.5–2.0)

## 2014-11-29 LAB — URIC ACID: Uric Acid, Serum: 3 mg/dL — ABNORMAL LOW (ref 4.4–7.6)

## 2014-11-29 LAB — MRSA PCR SCREENING: MRSA BY PCR: NEGATIVE

## 2014-11-29 LAB — GLUCOSE, CAPILLARY
Glucose-Capillary: 120 mg/dL — ABNORMAL HIGH (ref 65–99)
Glucose-Capillary: 129 mg/dL — ABNORMAL HIGH (ref 65–99)

## 2014-11-29 LAB — AMMONIA: Ammonia: 31 umol/L (ref 9–35)

## 2014-11-29 MED ORDER — FILGRASTIM 480 MCG/1.6ML IJ SOLN
480.0000 ug | Freq: Once | INTRAMUSCULAR | Status: DC
  Filled 2014-11-29: qty 1.6

## 2014-11-29 MED ORDER — PEGFILGRASTIM 6 MG/0.6ML ~~LOC~~ PSKT: 6.0000 mg | PREFILLED_SYRINGE | Freq: Once | SUBCUTANEOUS | Status: DC

## 2014-11-29 MED ORDER — ASPIRIN 81 MG PO CHEW: 324.0000 mg | CHEWABLE_TABLET | Freq: Once | ORAL | Status: DC

## 2014-11-29 MED ORDER — LORAZEPAM 2 MG/ML IJ SOLN
INTRAMUSCULAR | Status: AC
  Administered 2014-11-29: 0.5 mg via INTRAVENOUS
  Filled 2014-11-29: qty 1

## 2014-11-29 MED ORDER — HYDROMORPHONE HCL 1 MG/ML IJ SOLN
0.5000 mg | INTRAMUSCULAR | Status: DC | PRN
Start: 2014-11-29 — End: 2014-12-03
  Administered 2014-11-29 – 2014-12-02 (×11): 0.5 mg via INTRAVENOUS
  Filled 2014-11-29 (×12): qty 1

## 2014-11-29 MED ORDER — SODIUM CHLORIDE 0.9 % IV SOLN
100.0000 mg/m2 | Freq: Once | INTRAVENOUS | Status: AC
  Administered 2014-11-29: 190 mg via INTRAVENOUS
  Filled 2014-11-29: qty 9.5

## 2014-11-29 MED ORDER — HEPARIN SODIUM (PORCINE) 5000 UNIT/ML IJ SOLN
5000.0000 [IU] | Freq: Three times a day (TID) | INTRAMUSCULAR | Status: DC
  Administered 2014-11-29 – 2014-12-02 (×6): 5000 [IU] via SUBCUTANEOUS
  Filled 2014-11-29 (×7): qty 1

## 2014-11-29 MED ORDER — CEFTRIAXONE SODIUM IN DEXTROSE 20 MG/ML IV SOLN
1.0000 g | Freq: Once | INTRAVENOUS | Status: AC
  Administered 2014-11-29: 1 g via INTRAVENOUS

## 2014-11-29 MED ORDER — MAGNESIUM HYDROXIDE 400 MG/5ML PO SUSP: 30.0000 mL | Freq: Every day | ORAL | Status: DC | PRN

## 2014-11-29 MED ORDER — SODIUM CHLORIDE 0.9 % IV SOLN
INTRAVENOUS | Status: DC
  Filled 2014-11-29: qty 1000

## 2014-11-29 MED ORDER — ONDANSETRON HCL 4 MG/2ML IJ SOLN
Freq: Once | INTRAMUSCULAR | Status: AC
  Administered 2014-11-29: 4 mg via INTRAVENOUS
  Filled 2014-11-29: qty 8

## 2014-11-29 MED ORDER — LORAZEPAM 2 MG/ML IJ SOLN
0.5000 mg | INTRAMUSCULAR | Status: DC | PRN
  Administered 2014-11-29 – 2014-12-02 (×6): 0.5 mg via INTRAVENOUS
  Filled 2014-11-29 (×6): qty 1

## 2014-11-29 MED ORDER — TBO-FILGRASTIM 480 MCG/0.8ML ~~LOC~~ SOSY
480.0000 ug | PREFILLED_SYRINGE | Freq: Once | SUBCUTANEOUS | Status: DC
  Filled 2014-11-29: qty 0.8

## 2014-11-29 MED ORDER — CEFTRIAXONE SODIUM IN DEXTROSE 20 MG/ML IV SOLN
INTRAVENOUS | Status: AC
  Filled 2014-11-29: qty 50

## 2014-11-29 MED ORDER — CEFTRIAXONE SODIUM IN DEXTROSE 40 MG/ML IV SOLN
2.0000 g | INTRAVENOUS | Status: DC
  Administered 2014-11-30 – 2014-12-06 (×7): 2 g via INTRAVENOUS
  Filled 2014-11-29 (×9): qty 50

## 2014-11-29 MED ORDER — ACETAMINOPHEN 650 MG RE SUPP
650.0000 mg | Freq: Four times a day (QID) | RECTAL | Status: DC | PRN
  Administered 2014-12-02 – 2015-01-02 (×7): 650 mg via RECTAL
  Filled 2014-11-29 (×7): qty 1

## 2014-11-29 MED ORDER — ACETAMINOPHEN 325 MG PO TABS
650.0000 mg | ORAL_TABLET | Freq: Four times a day (QID) | ORAL | Status: DC | PRN
Start: 2014-11-29 — End: 2015-01-24
  Administered 2014-12-21 – 2015-01-21 (×6): 650 mg via ORAL
  Filled 2014-11-29 (×10): qty 2

## 2014-11-29 MED ORDER — SODIUM CHLORIDE 0.9 % IJ SOLN
3.0000 mL | Freq: Two times a day (BID) | INTRAMUSCULAR | Status: DC
  Administered 2014-11-29 – 2014-12-09 (×6): 3 mL via INTRAVENOUS

## 2014-11-29 MED ORDER — CETYLPYRIDINIUM CHLORIDE 0.05 % MT LIQD
7.0000 mL | Freq: Two times a day (BID) | OROMUCOSAL | Status: DC
  Administered 2014-11-30 – 2015-01-24 (×85): 7 mL via OROMUCOSAL

## 2014-11-29 NOTE — ED Notes (Addendum)
Troponin 0.06 repeated with verbal readback; EDP Kinner informed, no further orders

## 2014-11-29 NOTE — Progress Notes (Signed)
11/29/14 clinic to ER admission, increasing confusion, moaning out in pain, etoposide stopped half way through  Infusion, transferred to er, vss

## 2014-11-29 NOTE — ED Notes (Signed)
Pt back from CT

## 2014-11-29 NOTE — ED Notes (Addendum)
EDP at bedside, unsure of baseline. Unknown of when left sided facial droop appeared. Pt arrives from [place] to triage and back to room with left sided facial droop.

## 2014-11-29 NOTE — ED Notes (Addendum)
Oncologist called Dr Corky Downs at time of patient presenting symptoms; charge nurse informed approx 1050AM. Pt did not arrive until 1220 to ER.

## 2014-11-29 NOTE — ED Notes (Signed)
Pt will respond to voices and pain. Pt will not follow basic commands, unable to state place, time, situation, person. Pt drifts off to sleep when not being interacted with.

## 2014-11-29 NOTE — ED Provider Notes (Addendum)
[facility] Emergency Department Provider Note  ____________________________________________  Time seen: 12:35 PM  I have reviewed the triage vital signs and the nursing notes.   HISTORY  Chief Complaint Altered Mental Status   History limited due to altered mental status   HPI Caleb Francis is a 69 y.o. male who presents with altered mental status. Per Dr. Mike Gip, patient was acting normally this morning when he arrived for chemotherapy but soon after the infusion started he became confused. It is my understanding this was approximately 8:30 or 9 this morning. She evaluated the patient and then notified me at approximately 10:30 that he will be coming to the emergency department. She called again and told me that the patient had begun complaining of chest pain after another brief episode of chemotherapy. The patient arrived in the emergency department at approximately 1220. He is confused and altered. I'm concerned that he may have had a stroke.     Past Medical History  Diagnosis Date  . Arthritis   . Hypertension   . RA (rheumatoid arthritis)   . Anemia   . Cancer     lymphoma  . History of nuclear stress test     a. 12/2013: low risk, no sig ischemia, no EKG changes, no artifact, EF 63%  . Chronic kidney disease     Patient Active Problem List   Diagnosis Date Noted  . Altered mental status 11/29/2014  . Absolute anemia 11/27/2014  . BP (high blood pressure) 11/27/2014  . Lymphoma 11/27/2014  . Thrombocytopenia   . Sepsis 11/09/2014  . Abdominal pain 11/09/2014  . Hydronephrosis 11/09/2014  . Hypokalemia 10/31/2014  . Neoplasm related pain 10/31/2014  . Sinus tachycardia 10/31/2014  . Abdominal distention   . Hydronephrosis of left kidney 10/24/2014  . Mantle cell lymphoma 10/24/2014  . CAFL (chronic airflow limitation) 07/26/2014  . Rheumatoid arthritis with rheumatoid factor 01/01/2014    Past Surgical History  Procedure  Laterality Date  . Back surgery    . Fracture surgery      ankle  . Portacath placement    . Cystoscopy w/ ureteral stent placement Left 10/24/2014    Procedure: CYSTOSCOPY WITH RETROGRADE PYELOGRAM/URETERAL STENT PLACEMENT;  Surgeon: Irine Seal, MD;  Location: [facility] ORS;  Service: Urology;  Laterality: Left;    Current Outpatient Rx  Name  Route  Sig  Dispense  Refill  . acetaminophen (TYLENOL) 325 MG tablet   Oral   Take 2 tablets (650 mg total) by mouth every 6 (six) hours as needed for mild pain or fever.   30 tablet   0   . allopurinol (ZYLOPRIM) 300 MG tablet   Oral   Take 1 tablet (300 mg total) by mouth daily.   30 tablet   2   . alum & mag hydroxide-simeth (MAALOX/MYLANTA) 200-200-20 MG/5ML suspension   Oral   Take 30 mLs by mouth every 6 (six) hours as needed for indigestion or heartburn.   355 mL   0   . antiseptic oral rinse (CPC / CETYLPYRIDINIUM CHLORIDE 0.05%) 0.05 % LIQD solution   Mouth Rinse   7 mLs by Mouth Rinse route 2 times daily at 12 noon and 4 pm.   50 mL   0   . docusate sodium (COLACE) 100 MG capsule   Oral   Take 1 capsule (100 mg total) by mouth 2 (two) times daily.   10 capsule   0   . feeding supplement, ENSURE ENLIVE, (ENSURE  ENLIVE) LIQD   Oral   Take 237 mLs by mouth 2 (two) times daily between meals.   749 mL   12   . folic acid (FOLVITE) 1 MG tablet   Oral   Take 1 mg by mouth.         . magnesium oxide (MAG-OX) 400 (241.3 MG) MG tablet   Oral   Take 1 tablet (400 mg total) by mouth 2 (two) times daily.   30 tablet   1   . metoprolol tartrate (LOPRESSOR) 25 MG tablet   Oral   Take 1 tablet (25 mg total) by mouth 2 (two) times daily.   60 tablet   0   . oxybutynin (DITROPAN) 5 MG tablet   Oral   Take 1 tablet (5 mg total) by mouth every 8 (eight) hours as needed for bladder spasms (spasms or stent related pain).   30 tablet   0   . oxyCODONE-acetaminophen (PERCOCET/ROXICET) 5-325 MG per tablet   Oral   Take 1-2  tablets by mouth every 4 (four) hours as needed for moderate pain.   30 tablet   0   . potassium chloride (K-DUR) 10 MEQ tablet   Oral   Take 4 tablets (40 mEq total) by mouth 2 (two) times daily.   20 tablet   0   . senna-docusate (SENOKOT-S) 8.6-50 MG per tablet   Oral   Take 2 tablets by mouth 2 (two) times daily.         [name] spironolactone (ALDACTONE) 25 MG tablet                 Allergies Review of patient's allergies indicates no known allergies.  Family History  Problem Relation Age of Onset  . Cancer Mother     breast  . Cancer Father     bone cancer    Social History History  Substance Use Topics  . Smoking status: Former Smoker -- 0.50 packs/day for 60 years    Types: Cigarettes    Quit date: 10/18/2014  . Smokeless tobacco: Not on file  . Alcohol Use: No    Review of Systems  Level V caveat: Unable to obtain review of symptoms secondary to altered mental status  ____________________________________________   PHYSICAL EXAM:  VITAL SIGNS: ED Triage Vitals  Enc Vitals Group     BP 11/29/14 1222 107/69 mmHg     Pulse Rate 11/29/14 1222 56     Resp 11/29/14 1222 16     Temp --      Temp src --      SpO2 11/29/14 1222 100 %     Weight 11/29/14 1222 162 lb (73.483 kg)     Height 11/29/14 1222 6' (1.829 m)     Head Cir --      Peak Flow --      Pain Score 11/29/14 1250 0     Pain Loc --      Pain Edu? --      Excl. in [place]? --      Constitutional: Patient confused and disoriented Eyes: Conjunctivae are normal. PERRL. ENT   Head: Normocephalic and atraumatic.   Nose: No rhinnorhea.   Mouth/Throat: Mucous membranes are moist. Cardiovascular: Normal rate, regular rhythm. Normal and symmetric distal pulses are present in all extremities. No murmurs, rubs, or gallops. Respiratory: Normal respiratory effort without tachypnea nor retractions. Breath sounds are clear and equal bilaterally.  Gastrointestinal: Soft and non-tender in all  quadrants. No  distention. There is no CVA tenderness. Genitourinary: deferred Musculoskeletal: Nontender with normal range of motion in all extremities. No lower extremity tenderness nor edema. Neurologic:  Normal speech and language. Patient may have subtle left facial droop Skin:  Skin is warm, dry and intact. No rash noted.   ____________________________________________    LABS (pertinent positives/negatives)  Labs Reviewed  CBC WITH DIFFERENTIAL/PLATELET - Abnormal; Notable for the following:    RBC 2.54 (*)    Hemoglobin 7.0 (*)    HCT 21.9 (*)    MCHC 31.8 (*)    RDW 20.2 (*)    Neutro Abs 8.9 (*)    Lymphs Abs 0.4 (*)    All other components within normal limits  COMPREHENSIVE METABOLIC PANEL - Abnormal; Notable for the following:    Chloride 112 (*)    CO2 19 (*)    Glucose, Bld 127 (*)    BUN 25 (*)    Calcium 8.7 (*)    Total Protein 6.4 (*)    Albumin 2.4 (*)    AST 43 (*)    Alkaline Phosphatase 268 (*)    All other components within normal limits  LACTIC ACID, PLASMA - Abnormal; Notable for the following:    Lactic Acid, Venous 2.6 (*)    All other components within normal limits  TROPONIN I - Abnormal; Notable for the following:    Troponin I 0.06 (*)    All other components within normal limits  PROTIME-INR - Abnormal; Notable for the following:    Prothrombin Time 16.7 (*)    All other components within normal limits  GLUCOSE, CAPILLARY - Abnormal; Notable for the following:    Glucose-Capillary 120 (*)    All other components within normal limits  LACTIC ACID, PLASMA  CBG MONITORING, ED    ____________________________________________   EKG  ED ECG REPORT I, Lavonia Drafts, the attending physician, personally viewed and interpreted this ECG.   Date: 11/29/2014  EKG Time: 12:30 PM  Rate: 100  Rhythm: nonspecific ST and T waves changes, sinus tachycardia  Axis: Normal axis  Intervals:none  ST&T Change: T wave changes lateral  leads   ____________________________________________    RADIOLOGY  CT head no acute distress  ____________________________________________   PROCEDURES  Procedure(s) performed: none  Critical Care performed: none  ____________________________________________   INITIAL IMPRESSION / ASSESSMENT AND PLAN / ED COURSE  Pertinent labs & imaging results that were available during my care of the patient were reviewed by me and considered in my medical decision making (see chart for details).  Unclear cause of patient's altered mental status. Metabolic encephalopathy versus stroke are most likely. Code stroke called, CT ordered. If it is a stroke patient will be outside of the window unfortunately given time of presentation to ED  ----------------------------------------- 2:30 PM on 11/29/2014 -----------------------------------------  Initial concern was for possible stroke however patient with too numerous to count white cells in his urine and an elevated lactic acid which makes metabolic encephalopathy more likely. He has been cultured. We're giving him IV Rocephin. I will admit to the hospitalist service. He'll receive normal saline 30 mi./kg. given that this seems more likely to be metabolic in nature and patient is outside the window we will not be giving TPA  ____________________________________________   FINAL CLINICAL IMPRESSION(S) / ED DIAGNOSES  Final diagnoses:  Metabolic encephalopathy  UTI (lower urinary tract infection)     Lavonia Drafts, MD 11/29/14 [place], MD 11/29/14 1433

## 2014-11-29 NOTE — ED Notes (Signed)
Pt calling out, saying "momma don't hurt me". Pt is having a hard time with following simple instructions. Pt gets legs stuck in side rails of bed and has to be redirected back to bed. Side rails up X2, call bell within reach, bed lowered low to lowest position, yellow socks on patient. Will continue to monitor.

## 2014-11-29 NOTE — ED Notes (Signed)
Pt brought from cancer center for possible reaction to chemo treatment.  Pt arrives alone with no family, a/ox2. Denies any pain at time of triage.  Spoke with Dr Corky Downs about pt and he states nurses from cancer center stated he started acting "funny".  Pt with port that has been accessed.

## 2014-11-29 NOTE — ED Notes (Signed)
Pt to CT with RN and CT staff

## 2014-11-29 NOTE — ED Notes (Addendum)
Pt trying to get out of bed, redirected back to bed; pt unable to stand without jerking movements. Pt altered from baseline of arrival at this time. Admitting doctor paged.

## 2014-11-29 NOTE — ED Notes (Signed)
Pt son, Bransen called; no answer, unable to leave voicemail. [phone]

## 2014-11-29 NOTE — H&P (Addendum)
[facility] NAME: Caleb Francis    MR#:  [id number]  DATE OF BIRTH:  1946-03-04  DATE OF ADMISSION:  11/29/2014  PRIMARY CARE PHYSICIAN: No PCP Per Patient   REQUESTING/REFERRING PHYSICIAN: Dr Corky Downs   CHIEF COMPLAINT:   Chief Complaint  Patient presents with  . Altered Mental Status    HISTORY OF PRESENT ILLNESS:  Caleb Francis  is a 69 y.o. 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 He was in his normal state of health this morning when he presented for chemotherapy. During his chemotherapy infusion he became acutely confused and also complained of chest pain. He was brought to the emergency room where he is disoriented, agitated and evaluation shows a significant urinary tract infection.  I spoke by phone with his son who reports that since his last admission he has been doing very well. At baseline he is alert and oriented 4. He has been working with physical therapy at [facility] and making progress. His son spoke to him last night and at that time he was at baseline. According to his oncologist at the beginning of his chemotherapy session this morning he was also at baseline.  PAST MEDICAL HISTORY:   Past Medical History  Diagnosis Date  . Arthritis   . Hypertension   . RA (rheumatoid arthritis)   . Anemia   . Cancer     lymphoma  . History of nuclear stress test     a. 12/2013: low risk, no sig ischemia, no EKG changes, no artifact, EF 63%  . Chronic kidney disease     PAST SURGICAL HISTORY:   Past Surgical History  Procedure Laterality Date  . Back surgery    . Fracture surgery      ankle  . Portacath placement    . Cystoscopy w/ ureteral  stent placement Left 10/24/2014    Procedure: CYSTOSCOPY WITH RETROGRADE PYELOGRAM/URETERAL STENT PLACEMENT;  Surgeon: Irine Seal, MD;  Location: [facility] ORS;  Service: Urology;  Laterality: Left;    SOCIAL HISTORY:   History  Substance Use Topics  . Smoking status: Former Smoker -- 0.50 packs/day for 60 years    Types: Cigarettes    Quit date: 10/18/2014  . Smokeless tobacco: Not on file  . Alcohol Use: No    FAMILY HISTORY:   Family History  Problem Relation Age of Onset  . Cancer Mother     breast  . Cancer Father     bone cancer    DRUG ALLERGIES:  No Known Allergies  REVIEW OF SYSTEMS:   ROS   Unable to obtain due to altered mental status  MEDICATIONS AT HOME:   Prior to Admission medications   Medication Sig Start Date End Date Taking? Authorizing Provider  amLODipine (NORVASC) 5 MG tablet Take 5 mg by mouth daily.   Yes Historical Provider, MD  docusate sodium (COLACE) 100 MG capsule Take 1 capsule (100 mg total) by mouth 2 (two) times daily. 10/27/14  Yes Lloyd Huger, MD  folic acid (FOLVITE) 1 MG tablet Take 1 mg by mouth daily.   Yes Historical Provider, MD  hydroxychloroquine (PLAQUENIL) 200 MG tablet Take 400 mg by mouth daily.   Yes Historical Provider, MD  magnesium oxide (MAG-OX)  400 (241.3 MG) MG tablet Take 1 tablet (400 mg total) by mouth 2 (two) times daily. 10/29/14  Yes Lequita Asal, MD  meloxicam (MOBIC) 7.5 MG tablet Take 7.5 mg by mouth daily.   Yes Historical Provider, MD  metoprolol tartrate (LOPRESSOR) 25 MG tablet Take 1 tablet (25 mg total) by mouth 2 (two) times daily. 11/19/14  Yes Nicholes Mango, MD  oxybutynin (DITROPAN) 5 MG tablet Take 1 tablet (5 mg total) by mouth every 8 (eight) hours as needed for bladder spasms (spasms or stent related pain). Patient taking differently: Take 5 mg by mouth every 8 (eight) hours as needed for bladder spasms (or stent related pain).  10/30/14  Yes Max Sane, MD  oxyCODONE-acetaminophen  (PERCOCET/ROXICET) 5-325 MG per tablet Take 1-2 tablets by mouth every 4 (four) hours as needed for moderate pain. Patient taking differently: Take 1-2 tablets by mouth every 6 (six) hours as needed for moderate pain.  11/19/14  Yes Nicholes Mango, MD  potassium chloride (K-DUR) 10 MEQ tablet Take 4 tablets (40 mEq total) by mouth 2 (two) times daily. 11/19/14  Yes Nicholes Mango, MD  predniSONE (DELTASONE) 5 MG tablet Take 5 mg by mouth daily.   Yes Historical Provider, MD  senna-docusate (SENOKOT-S) 8.6-50 MG per tablet Take 2 tablets by mouth 2 (two) times daily. Patient taking differently: Take 2 tablets by mouth 2 (two) times daily as needed for mild constipation.  11/19/14  Yes Nicholes Mango, MD  spironolactone (ALDACTONE) 25 MG tablet Take 25 mg by mouth daily.   Yes Historical Provider, MD  acetaminophen (TYLENOL) 325 MG tablet Take 2 tablets (650 mg total) by mouth every 6 (six) hours as needed for mild pain or fever. Patient not taking: Reported on 11/29/2014 10/31/14   Max Sane, MD  allopurinol (ZYLOPRIM) 300 MG tablet Take 1 tablet (300 mg total) by mouth daily. Patient not taking: Reported on 11/29/2014 10/29/14   Lequita Asal, MD  alum & mag hydroxide-simeth (MAALOX/MYLANTA) 200-200-20 MG/5ML suspension Take 30 mLs by mouth every 6 (six) hours as needed for indigestion or heartburn. Patient not taking: Reported on 11/29/2014 11/19/14   Nicholes Mango, MD  antiseptic oral rinse (CPC / CETYLPYRIDINIUM CHLORIDE 0.05%) 0.05 % LIQD solution 7 mLs by Mouth Rinse route 2 times daily at 12 noon and 4 pm. Patient not taking: Reported on 11/29/2014 11/19/14   Nicholes Mango, MD  feeding supplement, ENSURE ENLIVE, (ENSURE ENLIVE) LIQD Take 237 mLs by mouth 2 (two) times daily between meals. Patient not taking: Reported on 11/29/2014 11/19/14   Nicholes Mango, MD      VITAL SIGNS:  Blood pressure 90/63, pulse 99, temperature 97.8 F (36.6 C), temperature source Axillary, resp. rate 24, height 6' (1.829 m), weight 73.483  kg (162 lb), SpO2 100 %.  PHYSICAL EXAMINATION:  GENERAL:  69 y.o.-year-old patient lying in the bed confused, EYES: Pupils equal, round, reactive to light and accommodation. No scleral icterus. Extraocular muscles intact.  HEENT: Head atraumatic, normocephalic. He does not open his mouth, oral mucous membranes appear moist, good dentition NECK:  Supple, no jugular venous distention. No thyroid enlargement, no tenderness.  LUNGS: Normal breath sounds bilaterally, no wheezing, rales,rhonchi or crepitation. No use of accessory muscles of respiration.  CARDIOVASCULAR: S1, S2 normal. No murmurs, rubs, or gallops.  ABDOMEN: Soft, tenderness to palpation in the lower abdominal quadrants, nondistended. Bowel sounds present. No organomegaly or mass. Some guarding and wincing during examination. EXTREMITIES: No pedal edema, cyanosis, or clubbing.  NEUROLOGIC:  Does not participate in neurologic examination, cranial nerves seem grossly intact, he is moving all 4 extremities spontaneously  PSYCHIATRIC: The patient is alert not oriented, does not answer questions, agitated SKIN: No obvious rash, lesion, or ulcer.   LABORATORY PANEL:   CBC  Recent Labs Lab 11/29/14 1318  WBC 9.5  HGB 7.0*  HCT 21.9*  PLT 288   ------------------------------------------------------------------------------------------------------------------  Chemistries   Recent Labs Lab 11/29/14 1318  NA 139  K 4.7  CL 112*  CO2 19*  GLUCOSE 127*  BUN 25*  CREATININE 1.21  CALCIUM 8.7*  AST 43*  ALT 42  ALKPHOS 268*  BILITOT 0.6   ------------------------------------------------------------------------------------------------------------------  Cardiac Enzymes  Recent Labs Lab 11/29/14 1318  TROPONINI 0.06*   ------------------------------------------------------------------------------------------------------------------  RADIOLOGY:  Ct Head Wo Contrast  11/29/2014   CLINICAL DATA:  Concern for  chemotherapy adverse reaction. Patient transfer from cancer center. Altered mental status.  EXAM: CT HEAD WITHOUT CONTRAST  TECHNIQUE: Contiguous axial images were obtained from the base of the skull through the vertex without intravenous contrast.  COMPARISON:  Head CT 11/09/2014  FINDINGS: No acute intracranial hemorrhage. No focal mass lesion. No CT evidence of acute infarction. No midline shift or mass effect. No hydrocephalus. Basilar cisterns are patent.  There is mild generalized cortical atrophy and proportional ventricular dilatation. Mild periventricular subcortical white matter hypodensities.  Paranasal sinuses and  mastoid air cells are clear.  IMPRESSION: 1. No acute intracranial findings.  No change from prior. 2. Mild atrophy and white matter microvascular disease.   Electronically Signed   By: Suzy Bouchard M.D.   On: 11/29/2014 13:48   Dg Chest Portable 1 View  11/29/2014   CLINICAL DATA:  Altered mental status. Possible reaction to chemotherapy.  EXAM: PORTABLE CHEST - 1 VIEW  COMPARISON:  11/15/2014  FINDINGS: Mild-to-moderate enlargement of the cardiopericardial silhouette with cephalization of blood flow. Faint Kerley B-lines noted at the right lung base, but no airspace edema.  Right IJ Port-A-Cath tip: SVC.  Tortuous thoracic aorta.  Deformity from prior surgical neck fracture the right humerus which appears healed.  IMPRESSION: Mild to moderate enlargement of the cardiopericardial silhouette with faint interstitial edema. No airspace opacity identified.   Electronically Signed   By: Van Clines M.D.   On: 11/29/2014 13:31    EKG:   Orders placed or performed during the hospital encounter of 11/29/14  . ED EKG  . ED EKG    IMPRESSION AND PLAN:   Principal Problem:   Sepsis Active Problems:   Mantle cell lymphoma   Altered mental status   Chest pain  #1 sepsis: Meets criteria with initial hypotension, tachycardia, urinary tract infection, lactic acidosis. Urine  and blood cultures are pending. He has been started on Rocephin in the emergency room which should be continued. He is immunocompromised due to chemotherapy.  #2 altered mental status: This is likely due to metabolic encephalopathy due to sepsis and urinary tract infection. His presentation today is similar to the presentation of his prior hospitalization. CT scan of the head is negative. There is some concern that he may have a facial droop which I have not been able to appreciate on examination. Will obtain an MRI once he is stable.  #3 chest pain: No EKG changes to suggest ischemia. We'll cycle cardiac enzymes. We'll monitor on telemetry. Initial troponin is 0.06.  #4 urinary tract infection: This patient has a history of bilateral malignant hydronephrosis. He has had a ureteral stent placed on 10/24/2014.  We will obtain a renal ultrasound to assess for change in kidney size. Monitor urine output. Currently renal function is normal.  #5 Mantle cell lymphoma: He will need to follow-up with oncology for continued treatment.    All the records are reviewed and case discussed with ED provider. Management plans discussed with the patient, family and they are in agreement.  CODE STATUS: DO NOT RESUSCITATE  TOTAL TIME TAKING CARE OF THIS PATIENT: 50 minutes critical care time.   Myrtis Ser M.D on 11/29/2014 at 3:20 PM  Between 7am to 6pm - Pager - [phone]  After 6pm go to [email or web address] - password [email or web address] [facility]  [facility]  Office  [phone]  CC: Primary care physician; No PCP Per Patient

## 2014-11-29 NOTE — ED Notes (Signed)
Pt placed on immunocompromised precautions.

## 2014-11-29 NOTE — ED Notes (Addendum)
Pt urinating on himself, changed and cleansed. Pt gets aggravated quickly and becomes aggressive when tried to calm down; pt deescalated and back in bed at this time.

## 2014-11-30 DIAGNOSIS — I10 Essential (primary) hypertension: Secondary | ICD-10-CM

## 2014-11-30 LAB — CBC
HCT: 20.2 % — ABNORMAL LOW (ref 40.0–52.0)
Hemoglobin: 6.6 g/dL — ABNORMAL LOW (ref 13.0–18.0)
MCH: 27.7 pg (ref 26.0–34.0)
MCHC: 32.7 g/dL (ref 32.0–36.0)
MCV: 84.9 fL (ref 80.0–100.0)
PLATELETS: 276 10*3/uL (ref 150–440)
RBC: 2.38 MIL/uL — ABNORMAL LOW (ref 4.40–5.90)
RDW: 19.8 % — AB (ref 11.5–14.5)
WBC: 8.5 10*3/uL (ref 3.8–10.6)

## 2014-11-30 LAB — BASIC METABOLIC PANEL
ANION GAP: 4 — AB (ref 5–15)
BUN: 28 mg/dL — ABNORMAL HIGH (ref 6–20)
CHLORIDE: 117 mmol/L — AB (ref 101–111)
CO2: 21 mmol/L — AB (ref 22–32)
CREATININE: 1.29 mg/dL — AB (ref 0.61–1.24)
Calcium: 8.6 mg/dL — ABNORMAL LOW (ref 8.9–10.3)
GFR calc Af Amer: >60 mL/min (ref >60)
GFR calc non Af Amer: 56 mL/min — ABNORMAL LOW (ref >60)
Glucose, Bld: 68 mg/dL (ref 65–99)
POTASSIUM: 4.5 mmol/L (ref 3.5–5.1)
SODIUM: 142 mmol/L (ref 135–145)

## 2014-11-30 LAB — GLUCOSE, CAPILLARY
GLUCOSE-CAPILLARY: 75 mg/dL (ref 65–99)
GLUCOSE-CAPILLARY: 100 mg/dL — AB (ref 65–99)
Glucose-Capillary: 64 mg/dL — ABNORMAL LOW (ref 65–99)
Glucose-Capillary: 111 mg/dL — ABNORMAL HIGH (ref 65–99)
Glucose-Capillary: 133 mg/dL — ABNORMAL HIGH (ref 65–99)

## 2014-11-30 LAB — HEMOGLOBIN AND HEMATOCRIT, BLOOD
HCT: 23.5 % — ABNORMAL LOW (ref 40.0–52.0)
HEMOGLOBIN: 7.9 g/dL — AB (ref 13.0–18.0)

## 2014-11-30 LAB — TROPONIN I: Troponin I: 0.07 ng/mL — ABNORMAL HIGH (ref <0.031)

## 2014-11-30 MED ORDER — HEPARIN SOD (PORK) LOCK FLUSH 100 UNIT/ML IV SOLN
500.0000 [IU] | Freq: Every day | INTRAVENOUS | Status: DC | PRN
  Filled 2014-11-30: qty 5

## 2014-11-30 MED ORDER — SODIUM CHLORIDE 0.9 % IJ SOLN: 3.0000 mL | INTRAMUSCULAR | Status: DC | PRN

## 2014-11-30 MED ORDER — DEXTROSE 50 % IV SOLN
1.0000 | Freq: Once | INTRAVENOUS | Status: AC
  Administered 2014-11-30: 50 mL via INTRAVENOUS
  Filled 2014-11-30: qty 50

## 2014-11-30 MED ORDER — SODIUM CHLORIDE 0.9 % IV SOLN: 250.0000 mL | Freq: Once | INTRAVENOUS | Status: DC

## 2014-11-30 MED ORDER — HEPARIN SOD (PORK) LOCK FLUSH 100 UNIT/ML IV SOLN
250.0000 [IU] | INTRAVENOUS | Status: DC | PRN
  Filled 2014-11-30: qty 3

## 2014-11-30 MED ORDER — HEPARIN SOD (PORK) LOCK FLUSH 100 UNIT/ML IV SOLN: 500.0000 [IU] | Freq: Every day | INTRAVENOUS | Status: DC | PRN

## 2014-11-30 MED ORDER — SODIUM CHLORIDE 0.9 % IJ SOLN: 10.0000 mL | INTRAMUSCULAR | Status: DC | PRN

## 2014-11-30 MED ORDER — HEPARIN SOD (PORK) LOCK FLUSH 100 UNIT/ML IV SOLN: 250.0000 [IU] | INTRAVENOUS | Status: DC | PRN

## 2014-11-30 MED ORDER — DIPHENHYDRAMINE HCL 25 MG PO CAPS: 25.0000 mg | ORAL_CAPSULE | Freq: Once | ORAL | Status: DC

## 2014-11-30 MED ORDER — HALOPERIDOL LACTATE 5 MG/ML IJ SOLN
2.5000 mg | INTRAMUSCULAR | Status: DC | PRN
  Administered 2014-11-30 – 2015-01-07 (×10): 2.5 mg via INTRAVENOUS
  Filled 2014-11-30 (×10): qty 1

## 2014-11-30 MED ORDER — DEXTROSE-NACL 5-0.9 % IV SOLN
INTRAVENOUS | Status: DC
  Administered 2014-11-30 – 2014-12-03 (×4): via INTRAVENOUS

## 2014-11-30 MED ORDER — SODIUM CHLORIDE 0.9 % IV SOLN: Freq: Once | INTRAVENOUS | Status: DC

## 2014-11-30 MED ORDER — DEXMEDETOMIDINE HCL IN NACL 400 MCG/100ML IV SOLN
0.4000 ug/kg/h | INTRAVENOUS | Status: DC
  Administered 2014-11-30: 0.4 ug/kg/h via INTRAVENOUS
  Administered 2014-11-30: 0.8 ug/kg/h via INTRAVENOUS
  Administered 2014-12-01: 1.2 ug/kg/h via INTRAVENOUS
  Administered 2014-12-01 (×2): 1 ug/kg/h via INTRAVENOUS
  Administered 2014-12-01: 1.2 ug/kg/h via INTRAVENOUS
  Administered 2014-12-02 (×4): 1 ug/kg/h via INTRAVENOUS
  Administered 2014-12-03: 0.8 ug/kg/h via INTRAVENOUS
  Administered 2014-12-03: 0.7 ug/kg/h via INTRAVENOUS
  Administered 2014-12-03: 0.8 ug/kg/h via INTRAVENOUS
  Administered 2014-12-04: 0.9 ug/kg/h via INTRAVENOUS
  Administered 2014-12-04: 0.849 ug/kg/h via INTRAVENOUS
  Filled 2014-11-30 (×16): qty 100

## 2014-11-30 MED ORDER — ACETAMINOPHEN 325 MG PO TABS: 650.0000 mg | ORAL_TABLET | Freq: Once | ORAL | Status: DC

## 2014-11-30 NOTE — Progress Notes (Addendum)
[facility]                                                                                                                                                                                            Patient Demographics   Caleb Francis, is a 69 y.o. male, DOB - [birth year], JYN:[id number]  Admit date - 11/29/2014   Admitting Physician Aldean Jewett, MD  Outpatient Primary MD for the patient is No PCP Per Patient   LOS - 1  Subjective: pt admited with fever and mental status changes as similar to recent hosptiliazation pt currently agiated, wife at bedside.      Review of Systems:   Unable to provide due to patient being confused  Vitals:   Filed Vitals:   11/30/14 0700 11/30/14 0747 11/30/14 0812 11/30/14 0900  BP: 104/59  99/54 121/56  Pulse: 109  126 132  Temp:  99.9 F (37.7 C)    TempSrc:  Axillary    Resp: 20  30 36  Height:      Weight:      SpO2: 95%  94% 95%    Wt Readings from Last 3 Encounters:  11/29/14 73.483 kg (162 lb)  11/27/14 68.2 kg (150 lb 5.7 oz)  11/25/14 70.761 kg (156 lb)     Intake/Output Summary (Last 24 hours) at 11/30/14 1017 Last data filed at 11/30/14 0400  Gross per 24 hour  Intake      0 ml  Output    175 ml  Net   -175 ml    Physical Exam:   GENERAL: Critically ill-appearing male confused HEAD, EYES, EARS, NOSE AND THROAT: Atraumatic, normocephalic. Extraocular muscles are intact. Pupils equal and reactive to light. Sclerae anicteric. No conjunctival injection. No oro-pharyngeal erythema.  NECK: Supple. There is no jugular venous distention. No bruits, no lymphadenopathy, no thyromegaly.  HEART:  tachycardic. No murmurs, no rubs, no clicks.  LUNGS: Clear to auscultation bilaterally. No rales or rhonchi. No wheezes.  ABDOMEN: Soft, flat, nontender, nondistended. Has good bowel sounds. No hepatosplenomegaly appreciated.  EXTREMITIES: No evidence of any cyanosis, clubbing, or  peripheral edema.  +2 pedal and radial pulses bilaterally.  NEUROLOGIC: Confused but moving all extremities spontaneously  SKIN: Moist and warm with no rashes appreciated.  Psych:  anxious,  LN: No inguinal LN enlargement    Antibiotics   Anti-infectives    Start     Dose/Rate Route Frequency Ordered Stop   11/30/14 1000  cefTRIAXone (ROCEPHIN) 2 g in dextrose 5 % 50 mL IVPB - Premix    Comments:  Entered per  MD progress note   2 g 100 mL/hr over 30 Minutes Intravenous Every 24 hours 11/29/14 2157     11/29/14 1415  cefTRIAXone (ROCEPHIN) 1 g in dextrose 5 % 50 mL IVPB - Premix     1 g 100 mL/hr over 30 Minutes Intravenous  Once 11/29/14 1406 11/29/14 1452   11/29/14 1414  cefTRIAXone (ROCEPHIN) 20 MG/ML IVPB 50 mL    Comments:  MONAR, NELLIE: cabinet override      11/29/14 1414 11/29/14 1424      Medications   Scheduled Meds: . sodium chloride   Intravenous Once  . antiseptic oral rinse  7 mL Mouth Rinse BID  . aspirin  324 mg Oral Once  . cefTRIAXone (ROCEPHIN)  IV  2 g Intravenous Q24H  . filgrastim  480 mcg Subcutaneous Once  . heparin  5,000 Units Subcutaneous 3 times per day  . sodium chloride  3 mL Intravenous Q12H   Continuous Infusions:  PRN Meds:.acetaminophen **OR** acetaminophen, HYDROmorphone (DILAUDID) injection, LORazepam, magnesium hydroxide   Data Review:   Micro Results Recent Results (from the past 240 hour(s))  Culture, blood (single)     Status: None (Preliminary result)   Collection Time: 11/29/14 11:09 AM  Result Value Ref Range Status   Specimen Description BLOOD  Final   Special Requests NONE  Final   Culture  Setup Time   Final    GRAM NEGATIVE RODS AEROBIC BOTTLE ONLY CRITICAL RESULT CALLED TO, READ BACK BY AND VERIFIED WITH: SANDRA VORBA ON 11/30/14 AT 0825 BY JEF    Culture GRAM NEGATIVE RODS AEROBIC BOTTLE ONLY   Final   Report Status PENDING  Incomplete  MRSA PCR Screening     Status: None   Collection Time: 11/29/14 12:13 PM   Result Value Ref Range Status   MRSA by PCR NEGATIVE NEGATIVE Final    Comment:        The GeneXpert MRSA Assay (FDA approved for NASAL specimens only), is one component of a comprehensive MRSA colonization surveillance program. It is not intended to diagnose MRSA infection nor to guide or monitor treatment for MRSA infections.   Culture, blood (routine x 2)     Status: None (Preliminary result)   Collection Time: 11/29/14  1:19 PM  Result Value Ref Range Status   Specimen Description BLOOD  Final   Special Requests Immunocompromised  Final   Culture NO GROWTH < 24 HOURS  Final   Report Status PENDING  Incomplete    Radiology Reports Ct Head Wo Contrast  11/29/2014   CLINICAL DATA:  Concern for chemotherapy adverse reaction. Patient transfer from cancer center. Altered mental status.  EXAM: CT HEAD WITHOUT CONTRAST  TECHNIQUE: Contiguous axial images were obtained from the base of the skull through the vertex without intravenous contrast.  COMPARISON:  Head CT 11/09/2014  FINDINGS: No acute intracranial hemorrhage. No focal mass lesion. No CT evidence of acute infarction. No midline shift or mass effect. No hydrocephalus. Basilar cisterns are patent.  There is mild generalized cortical atrophy and proportional ventricular dilatation. Mild periventricular subcortical white matter hypodensities.  Paranasal sinuses and  mastoid air cells are clear.  IMPRESSION: 1. No acute intracranial findings.  No change from prior. 2. Mild atrophy and white matter microvascular disease.   Electronically Signed   By: Suzy Bouchard M.D.   On: 11/29/2014 13:48   Ct Head Wo Contrast  11/09/2014   CLINICAL DATA:  Degenerating mental status, sepsis and history of progressive mantle cell  lymphoma.  EXAM: CT HEAD WITHOUT CONTRAST  TECHNIQUE: Contiguous axial images were obtained from the base of the skull through the vertex without intravenous contrast.  COMPARISON:  05/21/2009  FINDINGS: No findings by  unenhanced head CT to suggest brain involvement by lymphoma. The brain demonstrates no evidence of hemorrhage, infarction, edema, mass effect, extra-axial fluid collection, hydrocephalus or mass lesion. The skull is unremarkable.  IMPRESSION: Unremarkable head CT.   Electronically Signed   By: Aletta Edouard M.D.   On: 11/09/2014 13:30   Ct Abdomen Pelvis W Contrast  11/25/2014   CLINICAL DATA:  69 year old male with history of mantle cell lymphoma recently treated with chemotherapy.  EXAM: CT ABDOMEN AND PELVIS WITH CONTRAST  TECHNIQUE: Multidetector CT imaging of the abdomen and pelvis was performed using the standard protocol following bolus administration of intravenous contrast.  CONTRAST:  26mL OMNIPAQUE IOHEXOL 300 MG/ML  SOLN  COMPARISON:  CT of the abdomen and pelvis 11/01/2014.  FINDINGS: Lower chest: Retrocrural lymph nodes are again noted bilaterally, largest of which on the right measures 1.6 cm in short axis, similar to the prior examination. Small amount of pericardial fluid and/or thickening, unlikely to be of any hemodynamic significance at this time.  Hepatobiliary: Numerous tiny partially calcified gallstones are noted within the gallbladder. No findings to suggest acute cholecystitis at this time. No significant cystic or solid hepatic lesions. No intra or extrahepatic biliary ductal dilatation.  Pancreas: No pancreatic mass. No pancreatic ductal dilatation. No pancreatic or peripancreatic fluid or inflammatory changes.  Spleen: Spleen remains mildly enlarged, but is smaller than the prior examination, currently measuring up to 13.2 cm in length.  Adrenals/Urinary Tract: Thickening of the left adrenal gland is unchanged. Right adrenal gland is normal. Right kidney is normal in appearance. There is a left-sided double-J ureteral stent in position, with the proximal loop reformed in the interpolar region of the left renal collecting system, and the distal loop performed in the lumen of the  urinary bladder. Left kidney is otherwise normal in appearance. No hydroureteronephrosis. Mild diffuse bladder wall thickening, likely in part related to underdistention of the bladder. No discrete bladder mass identified.  Stomach/Bowel: The appearance of the stomach is normal. No pathologic dilatation of small bowel or colon. Normal appendix.  Vascular/Lymphatic: Extensive lymphadenopathy is again noted throughout the abdomen and pelvis. The bulkiest retroperitoneal node or nodal conglomerate is in the left para-aortic nodal station immediately inferior to the left renal hilum, and this has decreased in size compared to the prior study, currently measuring 6.4 x 5.3 cm (image 25 of series 2), as compared with 8.8 x 6.9 cm on prior exam 11/09/2014. Bulky pelvic adenopathy is again noted bilaterally, generally similar to the prior study, with index lymph node in the left external iliac nodal station (image 57 of series 2) only slightly smaller than the prior examination, currently measuring 3.6 x 5.0 cm (previously 4.2 x 5.1 cm). However, some lymph nodes are larger than the prior examination, including a right external iliac lymph node, which currently measures 4.9 x 3.5 cm (image 59 of series 2) as compared with 4.7 x 3.0 cm on the prior study. Centrally low-attenuation presumably necrotic lymph node in the left common femoral nodal station measuring 3.4 x 4.8 cm. Extensive atherosclerosis throughout the abdominal and pelvic vasculature, without evidence of aneurysm or dissection.  Reproductive: Coarse calcifications are noted in the prostate gland (nonspecific). Seminal vesicles are unremarkable in appearance.  Other: No significant volume of ascites.  No pneumoperitoneum.  Musculoskeletal: Status post L4 laminectomy. There are no aggressive appearing lytic or blastic lesions noted in the visualized portions of the skeleton.  IMPRESSION: 1. Today's study generally demonstrates slight positive response to therapy,  with generalized decrease of lymphadenopathy in the abdomen and pelvis. However, there several lymph nodes which are essentially unchanged, and some lymph nodes which are slightly increased compared to the prior examination, as discussed above. 2. Additional findings, as above, similar to the prior examination.   Electronically Signed   By: Vinnie Langton M.D.   On: 11/25/2014 13:12   Ct Abdomen Pelvis W Contrast  11/09/2014   CLINICAL DATA:  Diffuse abdominal pain.  Chemotherapy yesterday.  EXAM: CT ABDOMEN AND PELVIS WITH CONTRAST  TECHNIQUE: Multidetector CT imaging of the abdomen and pelvis was performed using the standard protocol following bolus administration of intravenous contrast.  CONTRAST:  155mL OMNIPAQUE IOHEXOL 300 MG/ML  SOLN  COMPARISON:  10/23/2014  FINDINGS: BODY WALL: Partly visible left lower axillary lymphadenopathy, recently evaluated by chest CT 10/30/2014.  There is bulky bilateral inguinal lymphadenopathy which has increased from 10/23/2014, with left inguinal node on image 88 measuring 31 mm short axis, previously 23 mm  LOWER CHEST: Reticular opacities in the lower lungs, greater on the right, is favored atelectasis. No definitive pneumonia. Lower thoracic lymphadenopathy has increased, with a left retrocrural lymph node currently 15 mm on image 23, previously 10 mm.  ABDOMEN/PELVIS:  Liver: No focal abnormality.  Biliary: Cholelithiasis. The gallbladder is distended but there is no inflammatory wall thickening.  Pancreas: Unremarkable.  Spleen: Mild splenomegaly, 15 cm in craniocaudal span. This is increased by approximately 4 cm since previous.  Adrenals: Negative.  Kidneys and ureters: Moderate bilateral hydroureteronephrosis to the level of the pelvis. This is despite an internal ureteral stent on the left which is well positioned. Symmetric renal enhancement.  Bladder: Thickening of the bladder wall, preferentially to the left, stable from previous. This is indeterminate and  will be assessed on followup imaging.  Reproductive: No pathologic findings.  Bowel: No obstruction. No inflammatory bowel wall thickening  Retroperitoneum: There is bulky and diffuse retroperitoneal lymphadenopathy. There has been significant and rapid increased from previous. Index left periaortic lymph node on image 39 measures 88 x 69 mm (previously 59 x 35 mm) on image 39). Left pelvic sidewall lymph node that was previously measured is on image 71 and stable at 58 x 41 mm. There is new enlargement of upper abdominal retroperitoneal lymph nodes . Some cystic change, presumably treatment related, present in the largest nodes, especially in the left pelvis.  Vascular: Mass effect on the left iliac veins by lymphadenopathy, with progressive subcutaneous edema in the left upper thigh.  OSSEOUS: No acute abnormalities.  IMPRESSION: 1. Rapid progression of lymphoma with marked enlargement of lymph nodes compared to 10/23/2014. 2. Bilateral hydroureteronephrosis, new on the right and unchanged on the left (despite a well-positioned left internal ureteral stent). 3. Progressive edema in the upper left thigh. Given severe compression of the left iliac vein by lymphadenopathy, consider Doppler to evaluate for DVT.   Electronically Signed   By: Monte Fantasia M.D.   On: 11/09/2014 02:13   [place] Extrem Low Left Comp  11/09/2014   CLINICAL DATA:  Left calf swelling and lower extremity pain. History of lymphoma.  EXAM: Left LOWER EXTREMITY VENOUS DOPPLER ULTRASOUND  TECHNIQUE: Gray-scale sonography with graded compression, as well as color Doppler and duplex ultrasound were performed to evaluate the lower extremity deep venous systems from  the level of the common femoral vein and including the common femoral, femoral, profunda femoral, popliteal and calf veins including the posterior tibial, peroneal and gastrocnemius veins when visible. The superficial great saphenous vein was also interrogated. Spectral Doppler was  utilized to evaluate flow at rest and with distal augmentation maneuvers in the common femoral, femoral and popliteal veins.  COMPARISON:  None.  FINDINGS: Contralateral Common Femoral Vein: Respiratory phasicity is normal and symmetric with the symptomatic side. No evidence of thrombus. Normal compressibility.  Common Femoral Vein: No evidence of thrombus. Normal compressibility, respiratory phasicity and response to augmentation.  Saphenofemoral Junction - Superficial Great Saphenous Vein: Due to patient pain, the patient requested that the examination be terminated therefore the superficial femoral vein through popliteal trifurcation could not be evaluated.  IMPRESSION: No evidence of deep venous thrombosis involving the upper common femoral vein. The patient requested that the examination be terminated and did not allow the sonographer to examine the lower extremity venous system from the level of the saphenous femoral junction through the popliteal trifurcation.   Electronically Signed   By: Conchita Paris M.D.   On: 11/09/2014 09:40   Dg Chest Portable 1 View  11/29/2014   CLINICAL DATA:  Altered mental status. Possible reaction to chemotherapy.  EXAM: PORTABLE CHEST - 1 VIEW  COMPARISON:  11/15/2014  FINDINGS: Mild-to-moderate enlargement of the cardiopericardial silhouette with cephalization of blood flow. Faint Kerley B-lines noted at the right lung base, but no airspace edema.  Right IJ Port-A-Cath tip: SVC.  Tortuous thoracic aorta.  Deformity from prior surgical neck fracture the right humerus which appears healed.  IMPRESSION: Mild to moderate enlargement of the cardiopericardial silhouette with faint interstitial edema. No airspace opacity identified.   Electronically Signed   By: Van Clines M.D.   On: 11/29/2014 13:31   Dg Chest Port 1 View  11/15/2014   CLINICAL DATA:  Shortness of breath and cough.  Weakness.  EXAM: PORTABLE CHEST - 1 VIEW  COMPARISON:  11/08/2014  FINDINGS: Tip of the  right chest port remains in the SVC. The heart is at the upper limits normal in size. There are increased perihilar markings, progressed from prior exam. Increased right basilar atelectasis, now with mild left basilar atelectasis. No definite pleural effusion or pneumothorax.  IMPRESSION: Increased perihilar markings concerning pulmonary edema. Increased right basilar atelectasis in development of mild left basilar atelectasis.   Electronically Signed   By: Jeb Levering M.D.   On: 11/15/2014 04:43   Dg Chest Port 1 View  11/08/2014   CLINICAL DATA:  Acute onset of sepsis.  Initial encounter.  EXAM: PORTABLE CHEST - 1 VIEW  COMPARISON:  Chest radiograph performed 11/12/2013, and CTA of the chest performed 10/30/2014  FINDINGS: The lungs are well-aerated. Mild vascular congestion is noted, with mild right basilar atelectasis. There is no evidence of pleural effusion or pneumothorax.  The cardiomediastinal silhouette is within normal limits. No acute osseous abnormalities are seen. A right-sided chest port is noted ending about the mid SVC.  IMPRESSION: Mild vascular congestion, with mild right basilar atelectasis. Lungs otherwise clear.   Electronically Signed   By: Garald Balding M.D.   On: 11/08/2014 23:06     CBC  Recent Labs Lab 11/27/14 0827 11/28/14 0913 11/29/14 1318 11/30/14 0500  WBC 11.4* 11.9* 9.5 8.5  HGB 7.6* 7.1* 7.0* 6.6*  HCT 22.9* 21.7* 21.9* 20.2*  PLT 256 284 288 276  MCV 84.3 84.8 86.5 84.9  MCH 27.8 27.5 27.5  27.7  MCHC 32.9 32.5 31.8* 32.7  RDW 19.1* 19.7* 20.2* 19.8*  LYMPHSABS  --  0.8* 0.4*  --   MONOABS  --  0.6 0.2  --   EOSABS  --  0.0 0.0  --   BASOSABS  --  0.0 0.0  --     Chemistries   Recent Labs Lab 11/27/14 0827 11/28/14 0913 11/29/14 0834 11/29/14 1009 11/29/14 1318 11/30/14 0500  NA 137 136 137 137 139 142  K 4.3 4.4 4.8 4.8 4.7 4.5  CL 105 108 108 108 112* 117*  CO2 21* 21* 19* 19* 19* 21*  GLUCOSE 111* 180* 141* 139* 127* 68  BUN 12  22* 25* 24* 25* 28*  CREATININE 1.26* 1.24 1.20 1.24 1.21 1.29*  CALCIUM 8.3* 8.0* 8.5* 8.4* 8.7* 8.6*  AST 27  --   --  44* 43*  --   ALT 25  --   --  45 42  --   ALKPHOS 247*  --   --  283* 268*  --   BILITOT 0.8  --   --  0.5 0.6  --    ------------------------------------------------------------------------------------------------------------------ estimated creatinine clearance is 57.8 mL/min (by C-G formula based on Cr of 1.29). ------------------------------------------------------------------------------------------------------------------ No results for input(s): HGBA1C in the last 72 hours. ------------------------------------------------------------------------------------------------------------------ No results for input(s): CHOL, HDL, LDLCALC, TRIG, CHOLHDL, LDLDIRECT in the last 72 hours. ------------------------------------------------------------------------------------------------------------------ No results for input(s): TSH, T4TOTAL, T3FREE, THYROIDAB in the last 72 hours.  Invalid input(s): FREET3 ------------------------------------------------------------------------------------------------------------------ No results for input(s): VITAMINB12, FOLATE, FERRITIN, TIBC, IRON, RETICCTPCT in the last 72 hours.  Coagulation profile  Recent Labs Lab 11/29/14 1318  INR 1.33    No results for input(s): DDIMER in the last 72 hours.  Cardiac Enzymes  Recent Labs Lab 11/29/14 1549 11/29/14 2121 11/30/14 0412  TROPONINI 0.06* 0.05* 0.07*   ------------------------------------------------------------------------------------------------------------------ Invalid input(s): POCBNP    Assessment & Plan   #1 sepsis: Meets criteria with initial hypotension, tachycardia, suspected due to urinary tract infection, also need to consider chemotherapy as the cause for his recurrent fever and presentation.  #2 altered mental status: This is likely due to metabolic  encephalopathy due to sepsis and urinary tract infection. Continue supportive care   #3 chest pain: No EKG changes to suggest ischemia.  Continue to monitor for any further cardiac symptoms  #4 urinary tract infection: This patient has a history of bilateral malignant hydronephrosis. He has had a ureteral stent placed on 10/24/2014. Renal ultrasound pending  #5 Mantle cell lymphoma: He will need to follow-up with oncology for continued treatment.   #6. Anemia: Due to chemotherapy and Mantle cell lymphoma, at this time will go ahead and transfuse patient 2 units of irradiated packed RBCs. I have discussed the risk and benefits with the wife was agreeable for the transfusion.  Prognosis seems to be very poor with this patient if no improvement by Monday I will ask palliative palliative care to see the patient again      Code Status Orders        Start     Ordered   11/29/14 1731  Full code   Continuous     11/29/14 1730       DNR per prvious admission     Consults  oncology  DVT Prophylaxis   SCDs   Lab Results  Component Value Date   PLT 276 11/30/2014     Time Spent in minutes critical care 58min    Dustin Flock M.D on  11/30/2014 at 10:17 AM  Between 7am to 6pm - Pager - [phone]  After 6pm go to [email or web address] - password [email or web address] [facility]   Office  [phone]

## 2014-11-30 NOTE — Progress Notes (Signed)
Pt was picked up from [facility] at 20:00.  Spoke with pharmacy concerning neupogen order that was ordered and was advised to wait for oncology to see pt today before giving it.  WBC count was within normal limits.
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
## 2014-11-30 NOTE — Progress Notes (Signed)
Report given to Quita Skye, RN and Katharine Look, RN at this time who are taking over patient's care.  Wife at bedside and no distress noted at this time.  Sheyli Horwitz B

## 2014-11-30 NOTE — Significant Event (Signed)
[name] Pre transfusion temperatute 96.5, and 15 into transfusion now 100.2.  Dr. Stevenson Clinch aware, reports to watch pt and continue transfusion .  HR and BP remain the same as pre-transfusion.

## 2014-11-30 NOTE — Consult Note (Signed)
[place]  Date of admission:  11/29/2014  Inpatient day:  11/29/2014  Consulting physician: Dr Myrtis Ser  Chief Complaint: Caleb Francis is an 69 y.o. male with mantle cell lymphoma currently day 3 of cycle #2 RICE chemotherapy who is admitted with altered mental status, a urinary tract infection, and chest pain.  HPI: The patient was diagnosed with mantle cell lymphoma on 03/13/2013.  He has been treated with Bendamustine and Rituxan, RCHOP chemotherapy, Ibrutinib, and recently RICE chemotherapy.   He was last admitted to [facility] on 11/08/2014 on day 3 of cycle #1 RICE chemotherapy with abdominal pain and clinical findings worrisome for sepsis. Abdominal and pelvic CT scan revealed rapidly enlarging abdominal adenopathy.  He was initially in the ICU and has transitioned to the medical oncology unit. His mental status fluctuated. He was placed on broad spectrum antibiotics (vancomycin and Zosyn). He was supported with GCSF, platetet and PRBC transfusions. Blood cultures remained negative. During his admission, his palpable adenopathy began to improve. Initial discussions were held regarding possible Hospice. Because of his dramatic improvement, decision was made to transfer to skilled nursing facility. At the time of discharge on 11/19/2014, his platelets were self sustaining.  He underwent chest, abdomen and pelvic CT scan on 11/25/2014. Imagine studies revealed a slight positive response with generalized decrease in adenopathy in the abdomen and pelvis. There were several lymph nodes unchanged. There were a few lymph nodes slightly increased (2-5 mm).  He began cycle #2 RICE chemotherapy on 11/27/2014.  At that time, he was doing well.  Palpable adenopathy had decreased in size.  He returned today for day 3 chemotherapy.  Initially, he was felt to be at baseline.  He went to the restroom and on his return became confused.  He thought he was at home, did not  know the day, and began perseverating.  He was afebrile.  Blood and urine cultures were obtained.  Neurologic exam was normal except for his confusion.  Oxygen sats and blood pressure were normal.  He was scheduled for an emergent head CT scan.  When initiating his last chemotherapy, he began to experience chest pain.  He received minimal etoposide.  He was transferred directly to the emergency room.   Past Medical History  Diagnosis Date  . Arthritis   . Hypertension   . RA (rheumatoid arthritis)   . Anemia   . Cancer     lymphoma  . History of nuclear stress test     a. 12/2013: low risk, no sig ischemia, no EKG changes, no artifact, EF 63%  . Chronic kidney disease     Past Surgical History  Procedure Laterality Date  . Back surgery    . Fracture surgery      ankle  . Portacath placement    . Cystoscopy w/ ureteral stent placement Left 10/24/2014    Procedure: CYSTOSCOPY WITH RETROGRADE PYELOGRAM/URETERAL STENT PLACEMENT;  Surgeon: Irine Seal, MD;  Location: [facility] ORS;  Service: Urology;  Laterality: Left;    Family History  Problem Relation Age of Onset  . Cancer Mother     breast  . Cancer Father     bone cancer    Social History:  reports that he quit smoking about 6 weeks ago. His smoking use included Cigarettes. He has a 30 pack-year smoking history. He does not have any smokeless tobacco history on file. He reports that he does not drink alcohol or use illicit drugs.  The patient is accompanied by  his son in the ICU.  Allergies: No Known Allergies  Medications Prior to Admission  Medication Sig Dispense Refill  . amLODipine (NORVASC) 5 MG tablet Take 5 mg by mouth daily.    [name] docusate sodium (COLACE) 100 MG capsule Take 1 capsule (100 mg total) by mouth 2 (two) times daily. 10 capsule 0  . folic acid (FOLVITE) 1 MG tablet Take 1 mg by mouth daily.    . hydroxychloroquine (PLAQUENIL) 200 MG tablet Take 400 mg by mouth daily.    . magnesium oxide (MAG-OX) 400 (241.3 MG)  MG tablet Take 1 tablet (400 mg total) by mouth 2 (two) times daily. 30 tablet 1  . meloxicam (MOBIC) 7.5 MG tablet Take 7.5 mg by mouth daily.    . metoprolol tartrate (LOPRESSOR) 25 MG tablet Take 1 tablet (25 mg total) by mouth 2 (two) times daily. 60 tablet 0  . oxybutynin (DITROPAN) 5 MG tablet Take 1 tablet (5 mg total) by mouth every 8 (eight) hours as needed for bladder spasms (spasms or stent related pain). (Patient taking differently: Take 5 mg by mouth every 8 (eight) hours as needed for bladder spasms (or stent related pain). ) 30 tablet 0  . oxyCODONE-acetaminophen (PERCOCET/ROXICET) 5-325 MG per tablet Take 1-2 tablets by mouth every 4 (four) hours as needed for moderate pain. (Patient taking differently: Take 1-2 tablets by mouth every 6 (six) hours as needed for moderate pain. ) 30 tablet 0  . potassium chloride (K-DUR) 10 MEQ tablet Take 4 tablets (40 mEq total) by mouth 2 (two) times daily. 20 tablet 0  . predniSONE (DELTASONE) 5 MG tablet Take 5 mg by mouth daily.    [name] senna-docusate (SENOKOT-S) 8.6-50 MG per tablet Take 2 tablets by mouth 2 (two) times daily. (Patient taking differently: Take 2 tablets by mouth 2 (two) times daily as needed for mild constipation. )    . spironolactone (ALDACTONE) 25 MG tablet Take 25 mg by mouth daily.    [name] acetaminophen (TYLENOL) 325 MG tablet Take 2 tablets (650 mg total) by mouth every 6 (six) hours as needed for mild pain or fever. (Patient not taking: Reported on 11/29/2014) 30 tablet 0  . allopurinol (ZYLOPRIM) 300 MG tablet Take 1 tablet (300 mg total) by mouth daily. (Patient not taking: Reported on 11/29/2014) 30 tablet 2  . alum & mag hydroxide-simeth (MAALOX/MYLANTA) 200-200-20 MG/5ML suspension Take 30 mLs by mouth every 6 (six) hours as needed for indigestion or heartburn. (Patient not taking: Reported on 11/29/2014) 355 mL 0  . antiseptic oral rinse (CPC / CETYLPYRIDINIUM CHLORIDE 0.05%) 0.05 % LIQD solution 7 mLs by Mouth Rinse route 2  times daily at 12 noon and 4 pm. (Patient not taking: Reported on 11/29/2014) 50 mL 0  . feeding supplement, ENSURE ENLIVE, (ENSURE ENLIVE) LIQD Take 237 mLs by mouth 2 (two) times daily between meals. (Patient not taking: Reported on 11/29/2014) 237 mL 12    Review of Systems: GENERAL:  Denies any complaints..  No fevers, sweats or weight loss. PERFORMANCE STATUS (ECOG):  1-2 HEENT:  No visual changes, runny nose, sore throat, mouth sores or tenderness. Lungs: No shortness of breath or cough.  No hemoptysis. Cardiac:  Chest pain resolved.  No palpitations, orthopnea, or PND. GI:  No nausea, vomiting, diarrhea, constipation, melena or hematochezia. GU:  No urgency, frequency, dysuria, or hematuria. Musculoskeletal:  No back pain.  No joint pain.  No muscle tenderness. Extremities:  No pain or swelling. Skin:  No rashes  or skin changes. Neuro:  Denies confusion.  No headache, numbness or weakness, balance or coordination issues. Endocrine:  No diabetes, thyroid issues, hot flashes or night sweats. Psych:  No mood changes, depression or anxiety. Pain:  No focal pain. Review of systems:  All other systems reviewed and found to be negative.   Physical Exam:  Blood pressure 103/66, pulse 110, temperature 97.7 F (36.5 C), temperature source Axillary, resp. rate 25, height 6' (1.829 m), weight 162 lb (73.483 kg), SpO2 95 %.  GENERAL:  Well developed, well nourished, sitting comfortably in infusion suite in no acute distress. MENTAL STATUS:  Alert and oriented to person.  Believes he is at home.  Does not know month or year. HEAD:  Short graying hair.  Normocephalic, atraumatic, face symmetric, no Cushingoid features. EYES:  Brown eyes.  Pupils equal round and reactive to light and accomodation.  No conjunctivitis or scleral icterus. ENT:  Oropharynx clear without lesion.  Tongue normal. Mucous membranes moist.  RESPIRATORY:  Clear to auscultation without rales, wheezes or  rhonchi. CARDIOVASCULAR:  Regular rate and rhythm without murmur, rub or gallop. ABDOMEN:  Soft, non-tender, with active bowel sounds, and no hepatosplenomegaly.  No masses. SKIN:  No rashes, ulcers or lesions. EXTREMITIES: No edema, no skin discoloration or tenderness.  No palpable cords. LYMPH NODES: No palpable cervical, supraclavicular, axillary or inguinal adenopathy  NEUROLOGICAL: Alert & oriented to person only; answers several questions appropriately then repeats the same answer on subsequent testing; cranial nerves II-XII intact; motor strength symmetric; sensation intact; finger to nose difficult as has trouble following multi-step commands.  RAM normal; gait not tested; no clonus; +/- Babinski.  Results for orders placed or performed during the hospital encounter of 11/29/14 (from the past 48 hour(s))  MRSA PCR Screening     Status: None   Collection Time: 11/29/14 12:13 PM  Result Value Ref Range   MRSA by PCR NEGATIVE NEGATIVE    Comment:        The GeneXpert MRSA Assay (FDA approved for NASAL specimens only), is one component of a comprehensive MRSA colonization surveillance program. It is not intended to diagnose MRSA infection nor to guide or monitor treatment for MRSA infections.   CBC with Differential     Status: Abnormal   Collection Time: 11/29/14  1:18 PM  Result Value Ref Range   WBC 9.5 3.8 - 10.6 K/uL   RBC 2.54 (L) 4.40 - 5.90 MIL/uL   Hemoglobin 7.0 (L) 13.0 - 18.0 g/dL   HCT 21.9 (L) 40.0 - 52.0 %   MCV 86.5 80.0 - 100.0 fL   MCH 27.5 26.0 - 34.0 pg   MCHC 31.8 (L) 32.0 - 36.0 g/dL   RDW 20.2 (H) 11.5 - 14.5 %   Platelets 288 150 - 440 K/uL   Neutrophils Relative % 94 %   Neutro Abs 8.9 (H) 1.4 - 6.5 K/uL   Lymphocytes Relative 4 %   Lymphs Abs 0.4 (L) 1.0 - 3.6 K/uL   Monocytes Relative 2 %   Monocytes Absolute 0.2 0.2 - 1.0 K/uL   Eosinophils Relative 0 %   Eosinophils Absolute 0.0 0 - 0.7 K/uL   Basophils Relative 0 %   Basophils Absolute  0.0 0 - 0.1 K/uL  Comprehensive metabolic panel     Status: Abnormal   Collection Time: 11/29/14  1:18 PM  Result Value Ref Range   Sodium 139 135 - 145 mmol/L   Potassium 4.7 3.5 - 5.1 mmol/L  Chloride 112 (H) 101 - 111 mmol/L   CO2 19 (L) 22 - 32 mmol/L   Glucose, Bld 127 (H) 65 - 99 mg/dL   BUN 25 (H) 6 - 20 mg/dL   Creatinine, Ser 1.21 0.61 - 1.24 mg/dL   Calcium 8.7 (L) 8.9 - 10.3 mg/dL   Total Protein 6.4 (L) 6.5 - 8.1 g/dL   Albumin 2.4 (L) 3.5 - 5.0 g/dL   AST 43 (H) 15 - 41 U/L   ALT 42 17 - 63 U/L   Alkaline Phosphatase 268 (H) 38 - 126 U/L   Total Bilirubin 0.6 0.3 - 1.2 mg/dL   GFR calc non Af Amer >60 >60 mL/min   GFR calc Af Amer >60 >60 mL/min    Comment: (NOTE) The eGFR has been calculated using the CKD EPI equation. This calculation has not been validated in all clinical situations. eGFR's persistently <60 mL/min signify possible Chronic Kidney Disease.    Anion gap 8 5 - 15  Troponin I     Status: Abnormal   Collection Time: 11/29/14  1:18 PM  Result Value Ref Range   Troponin I 0.06 (H) <0.031 ng/mL    Comment: READ BACK AND VERIFIED WITH ALLY  RILEY AT 0076 11/29/14 SDR        PERSISTENTLY INCREASED TROPONIN VALUES IN THE RANGE OF 0.04-0.49 ng/mL CAN BE SEEN IN:       -UNSTABLE ANGINA       -CONGESTIVE HEART FAILURE       -MYOCARDITIS       -CHEST TRAUMA       -ARRYHTHMIAS       -LATE PRESENTING MYOCARDIAL INFARCTION       -COPD   CLINICAL FOLLOW-UP RECOMMENDED.   Protime-INR     Status: Abnormal   Collection Time: 11/29/14  1:18 PM  Result Value Ref Range   Prothrombin Time 16.7 (H) 11.4 - 15.0 seconds   INR 1.33   Ammonia     Status: None   Collection Time: 11/29/14  1:18 PM  Result Value Ref Range   Ammonia 31 9 - 35 umol/L  Lactic acid, plasma     Status: Abnormal   Collection Time: 11/29/14  1:19 PM  Result Value Ref Range   Lactic Acid, Venous 2.6 (HH) 0.5 - 2.0 mmol/L    Comment: CRITICAL RESULT CALLED TO, READ BACK BY AND VERIFIED  WITH  ALLY RILEY AT 1428 11/29/14 SDR   Glucose, capillary     Status: Abnormal   Collection Time: 11/29/14  1:21 PM  Result Value Ref Range   Glucose-Capillary 120 (H) 65 - 99 mg/dL  Troponin I     Status: Abnormal   Collection Time: 11/29/14  3:49 PM  Result Value Ref Range   Troponin I 0.06 (H) <0.031 ng/mL    Comment: RESULTS PREVIOUSLY CALLED AT 1415  11/29/14 SDR        PERSISTENTLY INCREASED TROPONIN VALUES IN THE RANGE OF 0.04-0.49 ng/mL CAN BE SEEN IN:       -UNSTABLE ANGINA       -CONGESTIVE HEART FAILURE       -MYOCARDITIS       -CHEST TRAUMA       -ARRYHTHMIAS       -LATE PRESENTING MYOCARDIAL INFARCTION       -COPD   CLINICAL FOLLOW-UP RECOMMENDED.   Glucose, capillary     Status: Abnormal   Collection Time: 11/29/14  4:59 PM  Result Value Ref Range  Glucose-Capillary 129 (H) 65 - 99 mg/dL  Blood gas, arterial     Status: Abnormal   Collection Time: 11/29/14  5:45 PM  Result Value Ref Range   FIO2 0.21 %   pH, Arterial 7.36 7.350 - 7.450   pCO2 arterial 31 (L) 32.0 - 48.0 mmHg   pO2, Arterial 93 83.0 - 108.0 mmHg   Bicarbonate 17.5 (L) 21.0 - 28.0 mEq/L   Acid-base deficit 6.8 (H) 0.0 - 2.0 mmol/L   O2 Saturation 96.9 %   Patient temperature 37.0    Collection site RIGHT RADIAL    Sample type ARTERIAL DRAW    Allens test (pass/fail) POSITIVE (A) PASS  Lactic acid, plasma     Status: Abnormal   Collection Time: 11/29/14  9:21 PM  Result Value Ref Range   Lactic Acid, Venous 2.3 (HH) 0.5 - 2.0 mmol/L    Comment: CRITICAL RESULT CALLED TO, READ BACK BY AND VERIFIED WITH  ELIZABETH COBB AT 2214 11/29/14 SDR   Troponin I     Status: Abnormal   Collection Time: 11/29/14  9:21 PM  Result Value Ref Range   Troponin I 0.05 (H) <0.031 ng/mL    Comment: RESULTS PREVIOUSLY CALLED AT 1415  11/29/14 SDR        PERSISTENTLY INCREASED TROPONIN VALUES IN THE RANGE OF 0.04-0.49 ng/mL CAN BE SEEN IN:       -UNSTABLE ANGINA       -CONGESTIVE HEART FAILURE        -MYOCARDITIS       -CHEST TRAUMA       -ARRYHTHMIAS       -LATE PRESENTING MYOCARDIAL INFARCTION       -COPD   CLINICAL FOLLOW-UP RECOMMENDED.    Ct Head Wo Contrast  11/29/2014   CLINICAL DATA:  Concern for chemotherapy adverse reaction. Patient transfer from cancer center. Altered mental status.  EXAM: CT HEAD WITHOUT CONTRAST  TECHNIQUE: Contiguous axial images were obtained from the base of the skull through the vertex without intravenous contrast.  COMPARISON:  Head CT 11/09/2014  FINDINGS: No acute intracranial hemorrhage. No focal mass lesion. No CT evidence of acute infarction. No midline shift or mass effect. No hydrocephalus. Basilar cisterns are patent.  There is mild generalized cortical atrophy and proportional ventricular dilatation. Mild periventricular subcortical white matter hypodensities.  Paranasal sinuses and  mastoid air cells are clear.  IMPRESSION: 1. No acute intracranial findings.  No change from prior. 2. Mild atrophy and white matter microvascular disease.   Electronically Signed   By: Suzy Bouchard M.D.   On: 11/29/2014 13:48   Dg Chest Portable 1 View  11/29/2014   CLINICAL DATA:  Altered mental status. Possible reaction to chemotherapy.  EXAM: PORTABLE CHEST - 1 VIEW  COMPARISON:  11/15/2014  FINDINGS: Mild-to-moderate enlargement of the cardiopericardial silhouette with cephalization of blood flow. Faint Kerley B-lines noted at the right lung base, but no airspace edema.  Right IJ Port-A-Cath tip: SVC.  Tortuous thoracic aorta.  Deformity from prior surgical neck fracture the right humerus which appears healed.  IMPRESSION: Mild to moderate enlargement of the cardiopericardial silhouette with faint interstitial edema. No airspace opacity identified.   Electronically Signed   By: Van Clines M.D.   On: 11/29/2014 13:31    Assessment:  The patient is a 69 y.o. gentleman with relapsed mantle cell lymphoma currently day 3 of cycle #2 RICE chemotherapy.  He was  admitted with acute onset altered mental status likely due to metabolic  encephalopathy from a sepsis secondary to a UTI.  He has recently received ifosfamide which can result in confusion.  Plan:   1)  Hematology/Oncology-  Patient unfortunately did not complete his his second cycle of chemotherapy (similar to cycle #1).  He will require daily GCSF (480 mcg SQ) as he will become neutropenic.  Transfuse leukopoor and irradiated PRBCs and platelets as needed.  He was to have received 2 units PRBCs yesterday.  Maintain platelets > 20,000.  Check uric acid and follow creatinine as patient at risk for tumor lysis syndrome.  He is scheduled to be evaluated by [facility] transplant team next week. 2)  Infectious disease-  UTI on Rocephin.  Follow cultures.  Blood cultures q 24 hours prn temp 100.4 or greater. 3)  Neurologic-  Suspect metabolic encephalopathy as with last admission.  If does not clear with resolution of infection, will need head CT with contrast or head MRI. 4)  Cardiovascular-  Hemodynamically stable.  Currently no chest pain.  Following troponins.  No EKG changes worrisome for ischemia.  Thank you for allowing me to participate in [place] care.  I will follow him closely with you while hospitalized and after discharge in the outpatient department.  Lequita Asal, MD  11/29/2014, 8:33 PM

## 2014-11-30 NOTE — Progress Notes (Signed)
RN made Dr. Posey Pronto aware that patient had a low blood glucose this morning and required amp of d50, RN asked about finger sticks. RN made MD aware of positive blood culture and asked MD about q2H neuro checks. Dr. Posey Pronto stated " you can change the neuro checks to q4H and I will order fingersticks."  Caleb Francis

## 2014-11-30 NOTE — Progress Notes (Signed)
Pt had a blood sugar of 68.  Prime doc was called and an amp of d50 was given.

## 2014-11-30 NOTE — Consult Note (Addendum)
[place]  [phone]) [phone]  Fax:([phone]) [place] DOB: Jul 03, 1945  MR#: [id number]  OVF#:[id number]  Patient Care Team: No Pcp Per Patient as PCP - General (General Practice)  CHIEF COMPLAINT:  Chief Complaint  Patient presents with  . Altered Mental Status    INTERVAL HISTORY:  Mr. Schmieder is a 69 year old male with mantle cell lymphoma, currently day 3 of cycle 2 RICE chemotherapy. He was admitted yesterday with altered mental status during chemotherapy infusion. Noted to have urinary tract infection, sepsis, and subsequent chest pain. Patient was last admitted to [facility] on 11/08/2014 at the same point during cycle 1 of RICE chemotherapy with abdominal pain and sepsis. At that time he was discharged to skilled nursing after her dramatic improvement in condition. [place]  He began cycle #2 RICE chemotherapy on 11/27/2014. At that time, he was doing well. Palpable adenopathy had decreased in size. He returned today for day 3 chemotherapy. Initially, he was felt to be at baseline. He went to the restroom and on his return became confused. He thought he was at home, did not know the day, and began perseverating. He was afebrile. Blood and urine cultures were obtained. Neurologic exam was normal except for his confusion. Oxygen sats and blood pressure were normal. He was scheduled for an emergent head CT scan. When initiating his last chemotherapy, he began to experience chest pain. He received minimal etoposide. He was transferred directly to the emergency room.  Today patient is lying in bed with wife at bedside, he is a very confused and unable to answer questions appropriately.  REVIEW OF SYSTEMS:   Review of Systems  Unable to perform ROS: mental status change    As per HPI. Otherwise, a complete review of systems is negatve.  ONCOLOGY HISTORY:  No history exists.    PAST MEDICAL HISTORY: Past Medical History  Diagnosis Date    . Arthritis   . Hypertension   . RA (rheumatoid arthritis)   . Anemia   . Cancer     lymphoma  . History of nuclear stress test     a. 12/2013: low risk, no sig ischemia, no EKG changes, no artifact, EF 63%  . Chronic kidney disease     PAST SURGICAL HISTORY: Past Surgical History  Procedure Laterality Date  . Back surgery    . Fracture surgery      ankle  . Portacath placement    . Cystoscopy w/ ureteral stent placement Left 10/24/2014    Procedure: CYSTOSCOPY WITH RETROGRADE PYELOGRAM/URETERAL STENT PLACEMENT;  Surgeon: Irine Seal, MD;  Location: [facility] ORS;  Service: Urology;  Laterality: Left;    FAMILY HISTORY Family History  Problem Relation Age of Onset  . Cancer Mother     breast  . Cancer Father     bone cancer    GYNECOLOGIC HISTORY:  No LMP for male patient.     ADVANCED DIRECTIVES:    HEALTH MAINTENANCE: History  Substance Use Topics  . Smoking status: Former Smoker -- 0.50 packs/day for 60 years    Types: Cigarettes    Quit date: 10/18/2014  . Smokeless tobacco: Not on file  . Alcohol Use: No     Colonoscopy:  PAP:  Bone density:  Lipid panel:  No Known Allergies  Current Facility-Administered Medications  Medication Dose Route Frequency Provider Last Rate Last Dose  . acetaminophen (TYLENOL) tablet 650 mg  650 mg Oral Q6H PRN Aldean Jewett, MD  Or  . acetaminophen (TYLENOL) suppository 650 mg  650 mg Rectal Q6H PRN Aldean Jewett, MD      . antiseptic oral rinse (CPC / CETYLPYRIDINIUM CHLORIDE 0.05%) solution 7 mL  7 mL Mouth Rinse BID Aldean Jewett, MD   7 mL at 11/30/14 1028  . aspirin chewable tablet 324 mg  324 mg Oral Once Lavonia Drafts, MD   324 mg at 11/29/14 1605  . cefTRIAXone (ROCEPHIN) 2 g in dextrose 5 % 50 mL IVPB - Premix  2 g Intravenous Q24H Aldean Jewett, MD   2 g at 11/30/14 1001  . dextrose 5 %-0.9 % sodium chloride infusion   Intravenous Continuous Dustin Flock, MD 100 mL/hr at 11/30/14 1344    .  filgrastim (NEUPOGEN) injection 480 mcg  480 mcg Subcutaneous Once Aldean Jewett, MD   480 mcg at 11/30/14 0735  . heparin injection 5,000 Units  5,000 Units Subcutaneous 3 times per day Aldean Jewett, MD   5,000 Units at 11/30/14 1346  . HYDROmorphone (DILAUDID) injection 0.5 mg  0.5 mg Intravenous Q3H PRN Aldean Jewett, MD   0.5 mg at 11/30/14 1001  . LORazepam (ATIVAN) injection 0.5 mg  0.5 mg Intravenous Q4H PRN Aldean Jewett, MD   0.5 mg at 11/30/14 1226  . magnesium hydroxide (MILK OF MAGNESIA) suspension 30 mL  30 mL Oral Daily PRN Aldean Jewett, MD      . sodium chloride 0.9 % injection 3 mL  3 mL Intravenous Q12H Aldean Jewett, MD   3 mL at 11/29/14 2159   Facility-Administered Medications Ordered in Other Encounters  Medication Dose Route Frequency Provider Last Rate Last Dose  . 0.9 %  sodium chloride infusion   Intravenous Continuous Lequita Asal, MD 20 mL/hr at 11/06/14 1010 20 mL/hr at 11/06/14 1010  . 0.9 %  sodium chloride infusion   Intravenous Continuous Lequita Asal, MD   Stopped at 11/28/14 1500  . morphine 2 MG/ML injection 2 mg  2 mg Intravenous Q2H PRN Lequita Asal, MD   2 mg at 11/05/14 1422  . sodium chloride 0.9 % injection 10 mL  10 mL Intracatheter PRN Lequita Asal, MD   10 mL at 11/06/14 1011    OBJECTIVE: BP 113/63 mmHg  Pulse 123  Temp(Src) 99.4 F (37.4 C) (Axillary)  Resp 24  Ht 6' (1.829 m)  Wt 162 lb (73.483 kg)  BMI 21.97 kg/m2  SpO2 96%   Body mass index is 21.97 kg/(m^2).    ECOG FS:4 - Bedbound  General: Patient in bed with eyes closed, yelling out intermittently and flailing left arm. Lungs: Clear to auscultation bilaterally. Heart: Regular rate and rhythm. No rubs, murmurs, or gallops. Abdomen: Soft, nontender, nondistended. No organomegaly noted, normoactive bowel sounds. Musculoskeletal: No edema, cyanosis, or clubbing. Patient is unable to move the right arm. Neuro: Cranial nerves grossly  intact. Patient is not alert or oriented and cannot answer questions appropriately. Skin: No rashes or petechiae noted. Psych: Very confused, lethargic.    LAB RESULTS:     Component Value Date/Time   NA 142 11/30/2014 0500   NA 141 07/17/2014 1121   K 4.5 11/30/2014 0500   K 2.8* 07/17/2014 1121   CL 117* 11/30/2014 0500   CL 100 07/17/2014 1121   CO2 21* 11/30/2014 0500   CO2 32 07/17/2014 1121   GLUCOSE 68 11/30/2014 0500   GLUCOSE 105* 07/17/2014 1121   BUN 28*  11/30/2014 0500   BUN 9 07/17/2014 1121   CREATININE 1.29* 11/30/2014 0500   CREATININE 1.09 07/17/2014 1121   CALCIUM 8.6* 11/30/2014 0500   CALCIUM 8.9 07/17/2014 1121   PROT 6.4* 11/29/2014 1318   PROT 7.3 07/17/2014 1121   ALBUMIN 2.4* 11/29/2014 1318   ALBUMIN 3.1* 07/17/2014 1121   AST 43* 11/29/2014 1318   AST 17 07/17/2014 1121   ALT 42 11/29/2014 1318   ALT 12* 07/17/2014 1121   ALKPHOS 268* 11/29/2014 1318   ALKPHOS 79 07/17/2014 1121   BILITOT 0.6 11/29/2014 1318   GFRNONAA 56* 11/30/2014 0500   GFRNONAA >60 02/11/2014 0851   GFRAA >60 11/30/2014 0500   GFRAA >60 02/11/2014 0851    No results found for: SPEP, UPEP  Lab Results  Component Value Date   WBC 8.5 11/30/2014   NEUTROABS 8.9* 11/29/2014   HGB 6.6* 11/30/2014   HCT 20.2* 11/30/2014   MCV 84.9 11/30/2014   PLT 276 11/30/2014    @LASTCHEMISTRY @  No results found for: LABCA2  No components found for: LABCA125   Recent Labs Lab 11/29/14 1318  INR 1.33       Component Value Date/Time   COLORURINE YELLOW* 11/29/2014 1122   APPEARANCEUR HAZY* 11/29/2014 1122   LABSPEC 1.011 11/29/2014 1122   PHURINE 5.0 11/29/2014 1122   GLUCOSEU NEGATIVE 11/29/2014 1122   HGBUR 1+* 11/29/2014 1122   [place] 11/29/2014 1122   KETONESUR 2+* 11/29/2014 1122   PROTEINUR NEGATIVE 11/29/2014 1122   NITRITE NEGATIVE 11/29/2014 1122   LEUKOCYTESUR 2+* 11/29/2014 1122    STUDIES: Ct Head Wo Contrast  11/29/2014    CLINICAL DATA:  Concern for chemotherapy adverse reaction. Patient transfer from cancer center. Altered mental status.  EXAM: CT HEAD WITHOUT CONTRAST  TECHNIQUE: Contiguous axial images were obtained from the base of the skull through the vertex without intravenous contrast.  COMPARISON:  Head CT 11/09/2014  FINDINGS: No acute intracranial hemorrhage. No focal mass lesion. No CT evidence of acute infarction. No midline shift or mass effect. No hydrocephalus. Basilar cisterns are patent.  There is mild generalized cortical atrophy and proportional ventricular dilatation. Mild periventricular subcortical white matter hypodensities.  Paranasal sinuses and  mastoid air cells are clear.  IMPRESSION: 1. No acute intracranial findings.  No change from prior. 2. Mild atrophy and white matter microvascular disease.   Electronically Signed   By: Suzy Bouchard M.D.   On: 11/29/2014 13:48   Ct Head Wo Contrast  11/09/2014   CLINICAL DATA:  Degenerating mental status, sepsis and history of progressive mantle cell lymphoma.  EXAM: CT HEAD WITHOUT CONTRAST  TECHNIQUE: Contiguous axial images were obtained from the base of the skull through the vertex without intravenous contrast.  COMPARISON:  05/21/2009  FINDINGS: No findings by unenhanced head CT to suggest brain involvement by lymphoma. The brain demonstrates no evidence of hemorrhage, infarction, edema, mass effect, extra-axial fluid collection, hydrocephalus or mass lesion. The skull is unremarkable.  IMPRESSION: Unremarkable head CT.   Electronically Signed   By: Aletta Edouard M.D.   On: 11/09/2014 13:30   Ct Abdomen Pelvis W Contrast  11/25/2014   CLINICAL DATA:  69 year old male with history of mantle cell lymphoma recently treated with chemotherapy.  EXAM: CT ABDOMEN AND PELVIS WITH CONTRAST  TECHNIQUE: Multidetector CT imaging of the abdomen and pelvis was performed using the standard protocol following bolus administration of intravenous contrast.  CONTRAST:   67mL OMNIPAQUE IOHEXOL 300 MG/ML  SOLN  COMPARISON:  CT of the abdomen and pelvis 11/01/2014.  FINDINGS: Lower chest: Retrocrural lymph nodes are again noted bilaterally, largest of which on the right measures 1.6 cm in short axis, similar to the prior examination. Small amount of pericardial fluid and/or thickening, unlikely to be of any hemodynamic significance at this time.  Hepatobiliary: Numerous tiny partially calcified gallstones are noted within the gallbladder. No findings to suggest acute cholecystitis at this time. No significant cystic or solid hepatic lesions. No intra or extrahepatic biliary ductal dilatation.  Pancreas: No pancreatic mass. No pancreatic ductal dilatation. No pancreatic or peripancreatic fluid or inflammatory changes.  Spleen: Spleen remains mildly enlarged, but is smaller than the prior examination, currently measuring up to 13.2 cm in length.  Adrenals/Urinary Tract: Thickening of the left adrenal gland is unchanged. Right adrenal gland is normal. Right kidney is normal in appearance. There is a left-sided double-J ureteral stent in position, with the proximal loop reformed in the interpolar region of the left renal collecting system, and the distal loop performed in the lumen of the urinary bladder. Left kidney is otherwise normal in appearance. No hydroureteronephrosis. Mild diffuse bladder wall thickening, likely in part related to underdistention of the bladder. No discrete bladder mass identified.  Stomach/Bowel: The appearance of the stomach is normal. No pathologic dilatation of small bowel or colon. Normal appendix.  Vascular/Lymphatic: Extensive lymphadenopathy is again noted throughout the abdomen and pelvis. The bulkiest retroperitoneal node or nodal conglomerate is in the left para-aortic nodal station immediately inferior to the left renal hilum, and this has decreased in size compared to the prior study, currently measuring 6.4 x 5.3 cm (image 25 of series 2), as  compared with 8.8 x 6.9 cm on prior exam 11/09/2014. Bulky pelvic adenopathy is again noted bilaterally, generally similar to the prior study, with index lymph node in the left external iliac nodal station (image 57 of series 2) only slightly smaller than the prior examination, currently measuring 3.6 x 5.0 cm (previously 4.2 x 5.1 cm). However, some lymph nodes are larger than the prior examination, including a right external iliac lymph node, which currently measures 4.9 x 3.5 cm (image 59 of series 2) as compared with 4.7 x 3.0 cm on the prior study. Centrally low-attenuation presumably necrotic lymph node in the left common femoral nodal station measuring 3.4 x 4.8 cm. Extensive atherosclerosis throughout the abdominal and pelvic vasculature, without evidence of aneurysm or dissection.  Reproductive: Coarse calcifications are noted in the prostate gland (nonspecific). Seminal vesicles are unremarkable in appearance.  Other: No significant volume of ascites.  No pneumoperitoneum.  Musculoskeletal: Status post L4 laminectomy. There are no aggressive appearing lytic or blastic lesions noted in the visualized portions of the skeleton.  IMPRESSION: 1. Today's study generally demonstrates slight positive response to therapy, with generalized decrease of lymphadenopathy in the abdomen and pelvis. However, there several lymph nodes which are essentially unchanged, and some lymph nodes which are slightly increased compared to the prior examination, as discussed above. 2. Additional findings, as above, similar to the prior examination.   Electronically Signed   By: Vinnie Langton M.D.   On: 11/25/2014 13:12   Ct Abdomen Pelvis W Contrast  11/09/2014   CLINICAL DATA:  Diffuse abdominal pain.  Chemotherapy yesterday.  EXAM: CT ABDOMEN AND PELVIS WITH CONTRAST  TECHNIQUE: Multidetector CT imaging of the abdomen and pelvis was performed using the standard protocol following bolus administration of intravenous contrast.   CONTRAST:  176mL OMNIPAQUE IOHEXOL 300 MG/ML  SOLN  COMPARISON:  10/23/2014  FINDINGS: BODY WALL: Partly visible left lower axillary lymphadenopathy, recently evaluated by chest CT 10/30/2014.  There is bulky bilateral inguinal lymphadenopathy which has increased from 10/23/2014, with left inguinal node on image 88 measuring 31 mm short axis, previously 23 mm  LOWER CHEST: Reticular opacities in the lower lungs, greater on the right, is favored atelectasis. No definitive pneumonia. Lower thoracic lymphadenopathy has increased, with a left retrocrural lymph node currently 15 mm on image 23, previously 10 mm.  ABDOMEN/PELVIS:  Liver: No focal abnormality.  Biliary: Cholelithiasis. The gallbladder is distended but there is no inflammatory wall thickening.  Pancreas: Unremarkable.  Spleen: Mild splenomegaly, 15 cm in craniocaudal span. This is increased by approximately 4 cm since previous.  Adrenals: Negative.  Kidneys and ureters: Moderate bilateral hydroureteronephrosis to the level of the pelvis. This is despite an internal ureteral stent on the left which is well positioned. Symmetric renal enhancement.  Bladder: Thickening of the bladder wall, preferentially to the left, stable from previous. This is indeterminate and will be assessed on followup imaging.  Reproductive: No pathologic findings.  Bowel: No obstruction. No inflammatory bowel wall thickening  Retroperitoneum: There is bulky and diffuse retroperitoneal lymphadenopathy. There has been significant and rapid increased from previous. Index left periaortic lymph node on image 39 measures 88 x 69 mm (previously 59 x 35 mm) on image 39). Left pelvic sidewall lymph node that was previously measured is on image 71 and stable at 58 x 41 mm. There is new enlargement of upper abdominal retroperitoneal lymph nodes . Some cystic change, presumably treatment related, present in the largest nodes, especially in the left pelvis.  Vascular: Mass effect on the left iliac  veins by lymphadenopathy, with progressive subcutaneous edema in the left upper thigh.  OSSEOUS: No acute abnormalities.  IMPRESSION: 1. Rapid progression of lymphoma with marked enlargement of lymph nodes compared to 10/23/2014. 2. Bilateral hydroureteronephrosis, new on the right and unchanged on the left (despite a well-positioned left internal ureteral stent). 3. Progressive edema in the upper left thigh. Given severe compression of the left iliac vein by lymphadenopathy, consider Doppler to evaluate for DVT.   Electronically Signed   By: Monte Fantasia M.D.   On: 11/09/2014 02:13   US Renal  11/30/2014   CLINICAL DATA:  Lymphoma, urinary tract infection, previous placement of a left ureteral stent for obstructive lymphadenopathy and mass effect. Followup  EXAM: RENAL / URINARY TRACT ULTRASOUND COMPLETE  COMPARISON:  CT 11/25/2014  FINDINGS: Right Kidney:  Length: 12.4 cm. No mass or hydronephrosis. Mild cortical atrophy with increased cortical echogenicity.  Left Kidney:  Length: 14.3 cm. Improvement in now mild hydronephrosis. Mild left renal cortical thinning and increased echogenicity. Stent position not well visualized.  Bladder:  Concentric bladder wall thickening, 0.9 cm.  IMPRESSION: Improvement in now mild left hydronephrosis compared to the dissimilar prior exam.  Bilateral increased renal cortical echogenicity suggesting medical renal disease with mild degree of cortical atrophy.  Concentric bladder wall thickening which is nonspecific and may be seen with urinary tract infection/ cystitis, infiltrative processes including lymphomatous involvement, or treatment effect.   Electronically Signed   By: Conchita Paris M.D.   On: 11/30/2014 11:24   [place] Extrem Low Left Comp  11/09/2014   CLINICAL DATA:  Left calf swelling and lower extremity pain. History of lymphoma.  EXAM: Left LOWER EXTREMITY VENOUS DOPPLER ULTRASOUND  TECHNIQUE: Gray-scale sonography with graded compression, as well as color  Doppler and duplex ultrasound were  performed to evaluate the lower extremity deep venous systems from the level of the common femoral vein and including the common femoral, femoral, profunda femoral, popliteal and calf veins including the posterior tibial, peroneal and gastrocnemius veins when visible. The superficial great saphenous vein was also interrogated. Spectral Doppler was utilized to evaluate flow at rest and with distal augmentation maneuvers in the common femoral, femoral and popliteal veins.  COMPARISON:  None.  FINDINGS: Contralateral Common Femoral Vein: Respiratory phasicity is normal and symmetric with the symptomatic side. No evidence of thrombus. Normal compressibility.  Common Femoral Vein: No evidence of thrombus. Normal compressibility, respiratory phasicity and response to augmentation.  Saphenofemoral Junction - Superficial Great Saphenous Vein: Due to patient pain, the patient requested that the examination be terminated therefore the superficial femoral vein through popliteal trifurcation could not be evaluated.  IMPRESSION: No evidence of deep venous thrombosis involving the upper common femoral vein. The patient requested that the examination be terminated and did not allow the sonographer to examine the lower extremity venous system from the level of the saphenous femoral junction through the popliteal trifurcation.   Electronically Signed   By: Conchita Paris M.D.   On: 11/09/2014 09:40   Dg Chest Portable 1 View  11/29/2014   CLINICAL DATA:  Altered mental status. Possible reaction to chemotherapy.  EXAM: PORTABLE CHEST - 1 VIEW  COMPARISON:  11/15/2014  FINDINGS: Mild-to-moderate enlargement of the cardiopericardial silhouette with cephalization of blood flow. Faint Kerley B-lines noted at the right lung base, but no airspace edema.  Right IJ Port-A-Cath tip: SVC.  Tortuous thoracic aorta.  Deformity from prior surgical neck fracture the right humerus which appears healed.   IMPRESSION: Mild to moderate enlargement of the cardiopericardial silhouette with faint interstitial edema. No airspace opacity identified.   Electronically Signed   By: Van Clines M.D.   On: 11/29/2014 13:31   Dg Chest Port 1 View  11/15/2014   CLINICAL DATA:  Shortness of breath and cough.  Weakness.  EXAM: PORTABLE CHEST - 1 VIEW  COMPARISON:  11/08/2014  FINDINGS: Tip of the right chest port remains in the SVC. The heart is at the upper limits normal in size. There are increased perihilar markings, progressed from prior exam. Increased right basilar atelectasis, now with mild left basilar atelectasis. No definite pleural effusion or pneumothorax.  IMPRESSION: Increased perihilar markings concerning pulmonary edema. Increased right basilar atelectasis in development of mild left basilar atelectasis.   Electronically Signed   By: Jeb Levering M.D.   On: 11/15/2014 04:43   Dg Chest Port 1 View  11/08/2014   CLINICAL DATA:  Acute onset of sepsis.  Initial encounter.  EXAM: PORTABLE CHEST - 1 VIEW  COMPARISON:  Chest radiograph performed 11/12/2013, and CTA of the chest performed 10/30/2014  FINDINGS: The lungs are well-aerated. Mild vascular congestion is noted, with mild right basilar atelectasis. There is no evidence of pleural effusion or pneumothorax.  The cardiomediastinal silhouette is within normal limits. No acute osseous abnormalities are seen. A right-sided chest port is noted ending about the mid SVC.  IMPRESSION: Mild vascular congestion, with mild right basilar atelectasis. Lungs otherwise clear.   Electronically Signed   By: Garald Balding M.D.   On: 11/08/2014 23:06    ASSESSMENT:  Mantle cell lymphoma. Currently day 3 of cycle 2 RICE chemotherapy.  Metabolic encephalopathy secondary to sepsis.  PLAN:   1. Mantle cell lymphoma. Patient was unable to complete second cycle of chemotherapy. GCSF has been started  in hospital. Patient has also been ordered 2 units of packed red  blood cells. Transfuse leukopoor and irradiated PRBCs and platelets as needed. Maintain platelets > 20,000. Check uric acid and follow creatinine as patient at risk for tumor lysis syndrome. He is scheduled to be evaluated by [facility] transplant team next week. 2. Sepsis. Most likely cause UTI. We'll continue to follow cultures. 3. Metabolic encephalopathy. Consistent with previous admission symptoms as well. Today patient is unable to move her right arm. CT scan on June 17 was negative for acute abnormality, but with new symptoms discussed with Dr. Posey Pronto and agree to rescan with CT scan without contrast of the brain.  We will continue to follow during hospitalization.  Dr. Grayland Ormond was available for consultation and review of plan of care for this patient.   Evlyn Kanner, NP   11/30/2014 1:53 PM

## 2014-12-01 ENCOUNTER — Encounter: Payer: Self-pay | Admitting: *Deleted

## 2014-12-01 LAB — BASIC METABOLIC PANEL
Anion gap: 4 — ABNORMAL LOW (ref 5–15)
BUN: 43 mg/dL — AB (ref 6–20)
CALCIUM: 8.4 mg/dL — AB (ref 8.9–10.3)
CO2: 19 mmol/L — AB (ref 22–32)
Chloride: 123 mmol/L — ABNORMAL HIGH (ref 101–111)
Creatinine, Ser: 1.91 mg/dL — ABNORMAL HIGH (ref 0.61–1.24)
GFR, EST AFRICAN AMERICAN: 40 mL/min — AB (ref >60)
GFR, EST NON AFRICAN AMERICAN: 35 mL/min — AB (ref >60)
Glucose, Bld: 173 mg/dL — ABNORMAL HIGH (ref 65–99)
Potassium: 3.6 mmol/L (ref 3.5–5.1)
Sodium: 146 mmol/L — ABNORMAL HIGH (ref 135–145)

## 2014-12-01 LAB — URINE CULTURE: Culture: >=100000

## 2014-12-01 LAB — CBC
HCT: 24.8 % — ABNORMAL LOW (ref 40.0–52.0)
Hemoglobin: 8.2 g/dL — ABNORMAL LOW (ref 13.0–18.0)
MCH: 28.3 pg (ref 26.0–34.0)
MCHC: 33.1 g/dL (ref 32.0–36.0)
MCV: 85.7 fL (ref 80.0–100.0)
Platelets: 191 10*3/uL (ref 150–440)
RBC: 2.89 MIL/uL — ABNORMAL LOW (ref 4.40–5.90)
RDW: 18.8 % — ABNORMAL HIGH (ref 11.5–14.5)
WBC: 7.4 10*3/uL (ref 3.8–10.6)

## 2014-12-01 LAB — GLUCOSE, CAPILLARY
GLUCOSE-CAPILLARY: 213 mg/dL — AB (ref 65–99)
Glucose-Capillary: 162 mg/dL — ABNORMAL HIGH (ref 65–99)

## 2014-12-01 MED ORDER — PIPERACILLIN-TAZOBACTAM 3.375 G IVPB
3.3750 g | Freq: Three times a day (TID) | INTRAVENOUS | Status: DC
  Administered 2014-12-01 – 2014-12-03 (×7): 3.375 g via INTRAVENOUS
  Filled 2014-12-01 (×11): qty 50

## 2014-12-01 NOTE — Progress Notes (Signed)
[place] [facility]                                                                                                                                                                                            Patient Demographics   Caleb Francis, is a 69 y.o. male, DOB - [birth year], BLT:[id number]  Admit date - 11/29/2014   Admitting Physician Aldean Jewett, MD  Outpatient Primary MD for the patient is No PCP Per Patient   LOS - 2  Subjective: Patient had to be started on a Precedex drip, he is he is still agitated but improved due to the Precedex drip. Has received 2 units of packed RBCs yesterday hemoglobin is currently stable.   Review of Systems:   Unable to provide due to patient being confused  Vitals:   Filed Vitals:   12/01/14 0300 12/01/14 0400 12/01/14 0700 12/01/14 0800  BP: 104/69 110/73 112/79 117/76  Pulse: 86 86 88 88  Temp:      TempSrc:      Resp: 25 25 29 15   Height:      Weight:      SpO2: 97% 96%      Wt Readings from Last 3 Encounters:  11/29/14 73.483 kg (162 lb)  11/27/14 68.2 kg (150 lb 5.7 oz)  11/25/14 70.761 kg (156 lb)     Intake/Output Summary (Last 24 hours) at 12/01/14 1038 Last data filed at 11/30/14 1655  Gross per 24 hour  Intake    240 ml  Output    500 ml  Net   -260 ml    Physical Exam:   GENERAL: Critically ill-appearing male confused HEAD, EYES, EARS, NOSE AND THROAT: Atraumatic, normocephalic. Extraocular muscles are intact. Pupils equal and reactive to light. Sclerae anicteric. No conjunctival injection. No oro-pharyngeal erythema.  NECK: Supple. There is no jugular venous distention. No bruits, no lymphadenopathy, no thyromegaly.  HEART:  tachycardic. No murmurs, no rubs, no clicks.  LUNGS: Clear to auscultation bilaterally. No rales or rhonchi. No wheezes.  ABDOMEN: Soft, flat, nontender, nondistended. Has good bowel sounds. No hepatosplenomegaly appreciated.  EXTREMITIES: No  evidence of any cyanosis, clubbing, or peripheral edema.  +2 pedal and radial pulses bilaterally.  NEUROLOGIC: Confused but moving all extremities spontaneously  SKIN: Moist and warm with no rashes appreciated.  Psych:  anxious,  LN: No inguinal LN enlargement    Antibiotics   Anti-infectives    Start     Dose/Rate Route Frequency Ordered Stop   12/01/14 0945  piperacillin-tazobactam (ZOSYN) IVPB 3.375 g     3.375 g 12.5 mL/hr  over 240 Minutes Intravenous 3 times per day 12/01/14 0936     11/30/14 1000  cefTRIAXone (ROCEPHIN) 2 g in dextrose 5 % 50 mL IVPB - Premix    Comments:  Entered per MD progress note   2 g 100 mL/hr over 30 Minutes Intravenous Every 24 hours 11/29/14 2157     11/29/14 1415  cefTRIAXone (ROCEPHIN) 1 g in dextrose 5 % 50 mL IVPB - Premix     1 g 100 mL/hr over 30 Minutes Intravenous  Once 11/29/14 1406 11/29/14 1452   11/29/14 1414  cefTRIAXone (ROCEPHIN) 20 MG/ML IVPB 50 mL    Comments:  MONAR, NELLIE: cabinet override      11/29/14 1414 11/29/14 1424      Medications   Scheduled Meds: . sodium chloride  250 mL Intravenous Once  . sodium chloride  250 mL Intravenous Once  . acetaminophen  650 mg Oral Once  . antiseptic oral rinse  7 mL Mouth Rinse BID  . aspirin  324 mg Oral Once  . cefTRIAXone (ROCEPHIN)  IV  2 g Intravenous Q24H  . diphenhydrAMINE  25 mg Oral Once  . filgrastim  480 mcg Subcutaneous Once  . heparin  5,000 Units Subcutaneous 3 times per day  . piperacillin-tazobactam (ZOSYN)  IV  3.375 g Intravenous 3 times per day  . sodium chloride  3 mL Intravenous Q12H   Continuous Infusions: . dexmedetomidine 1 mcg/kg/hr (12/01/14 0810)  . dextrose 5 % and 0.9% NaCl 100 mL/hr at 12/01/14 0400   PRN Meds:.acetaminophen **OR** acetaminophen, haloperidol lactate, heparin lock flush, heparin lock flush, heparin lock flush, heparin lock flush, HYDROmorphone (DILAUDID) injection, LORazepam, magnesium hydroxide, sodium chloride, sodium chloride,  sodium chloride, sodium chloride   Data Review:   Micro Results Recent Results (from the past 240 hour(s))  Culture, blood (single)     Status: None (Preliminary result)   Collection Time: 11/29/14 11:09 AM  Result Value Ref Range Status   Specimen Description BLOOD  Final   Special Requests NONE  Final   Culture  Setup Time   Final    GRAM NEGATIVE RODS AEROBIC BOTTLE ONLY CRITICAL RESULT CALLED TO, READ BACK BY AND VERIFIED WITH: SANDRA VORBA ON 11/30/14 AT 0825 BY JEF    Culture   Final    GRAM NEGATIVE RODS AEROBIC BOTTLE ONLY IDENTIFICATION AND SUSCEPTIBILITIES TO FOLLOW    Report Status PENDING  Incomplete  Urine culture     Status: None   Collection Time: 11/29/14 11:22 AM  Result Value Ref Range Status   Specimen Description URINE, CLEAN CATCH  Final   Special Requests NONE  Final   Culture >=100,000 COLONIES/mL KLEBSIELLA OXYTOCA  Final   Report Status 12/01/2014 FINAL  Final   Organism ID, Bacteria KLEBSIELLA OXYTOCA  Final      Susceptibility   Klebsiella oxytoca - MIC*    AMPICILLIN >=32 RESISTANT Resistant     CEFAZOLIN >=64 RESISTANT Resistant     CEFTRIAXONE 4 SENSITIVE Sensitive     CIPROFLOXACIN <=0.25 SENSITIVE Sensitive     GENTAMICIN <=1 SENSITIVE Sensitive     IMIPENEM <=0.25 SENSITIVE Sensitive     NITROFURANTOIN <=16 SENSITIVE Sensitive     TRIMETH/SULFA <=20 SENSITIVE Sensitive     CEFOXITIN <=4 SENSITIVE Sensitive     * >=100,000 COLONIES/mL KLEBSIELLA OXYTOCA  MRSA PCR Screening     Status: None   Collection Time: 11/29/14 12:13 PM  Result Value Ref Range Status   MRSA by PCR NEGATIVE  NEGATIVE Final    Comment:        The GeneXpert MRSA Assay (FDA approved for NASAL specimens only), is one component of a comprehensive MRSA colonization surveillance program. It is not intended to diagnose MRSA infection nor to guide or monitor treatment for MRSA infections.   Culture, blood (routine x 2)     Status: None (Preliminary result)    Collection Time: 11/29/14  1:19 PM  Result Value Ref Range Status   Specimen Description BLOOD  Final   Special Requests Immunocompromised  Final   Culture NO GROWTH 2 DAYS  Final   Report Status PENDING  Incomplete  Urine culture     Status: None (Preliminary result)   Collection Time: 11/30/14 10:12 AM  Result Value Ref Range Status   Specimen Description URINE, CATHETERIZED  Final   Special Requests Immunocompromised  Final   Culture NO GROWTH < 24 HOURS  Final   Report Status PENDING  Incomplete    Radiology Reports Ct Head Wo Contrast  11/29/2014   CLINICAL DATA:  Concern for chemotherapy adverse reaction. Patient transfer from cancer center. Altered mental status.  EXAM: CT HEAD WITHOUT CONTRAST  TECHNIQUE: Contiguous axial images were obtained from the base of the skull through the vertex without intravenous contrast.  COMPARISON:  Head CT 11/09/2014  FINDINGS: No acute intracranial hemorrhage. No focal mass lesion. No CT evidence of acute infarction. No midline shift or mass effect. No hydrocephalus. Basilar cisterns are patent.  There is mild generalized cortical atrophy and proportional ventricular dilatation. Mild periventricular subcortical white matter hypodensities.  Paranasal sinuses and  mastoid air cells are clear.  IMPRESSION: 1. No acute intracranial findings.  No change from prior. 2. Mild atrophy and white matter microvascular disease.   Electronically Signed   By: Suzy Bouchard M.D.   On: 11/29/2014 13:48   Ct Head Wo Contrast  11/09/2014   CLINICAL DATA:  Degenerating mental status, sepsis and history of progressive mantle cell lymphoma.  EXAM: CT HEAD WITHOUT CONTRAST  TECHNIQUE: Contiguous axial images were obtained from the base of the skull through the vertex without intravenous contrast.  COMPARISON:  05/21/2009  FINDINGS: No findings by unenhanced head CT to suggest brain involvement by lymphoma. The brain demonstrates no evidence of hemorrhage, infarction, edema,  mass effect, extra-axial fluid collection, hydrocephalus or mass lesion. The skull is unremarkable.  IMPRESSION: Unremarkable head CT.   Electronically Signed   By: Aletta Edouard M.D.   On: 11/09/2014 13:30   Ct Abdomen Pelvis W Contrast  11/25/2014   CLINICAL DATA:  69 year old male with history of mantle cell lymphoma recently treated with chemotherapy.  EXAM: CT ABDOMEN AND PELVIS WITH CONTRAST  TECHNIQUE: Multidetector CT imaging of the abdomen and pelvis was performed using the standard protocol following bolus administration of intravenous contrast.  CONTRAST:  49mL OMNIPAQUE IOHEXOL 300 MG/ML  SOLN  COMPARISON:  CT of the abdomen and pelvis 11/01/2014.  FINDINGS: Lower chest: Retrocrural lymph nodes are again noted bilaterally, largest of which on the right measures 1.6 cm in short axis, similar to the prior examination. Small amount of pericardial fluid and/or thickening, unlikely to be of any hemodynamic significance at this time.  Hepatobiliary: Numerous tiny partially calcified gallstones are noted within the gallbladder. No findings to suggest acute cholecystitis at this time. No significant cystic or solid hepatic lesions. No intra or extrahepatic biliary ductal dilatation.  Pancreas: No pancreatic mass. No pancreatic ductal dilatation. No pancreatic or peripancreatic fluid or inflammatory  changes.  Spleen: Spleen remains mildly enlarged, but is smaller than the prior examination, currently measuring up to 13.2 cm in length.  Adrenals/Urinary Tract: Thickening of the left adrenal gland is unchanged. Right adrenal gland is normal. Right kidney is normal in appearance. There is a left-sided double-J ureteral stent in position, with the proximal loop reformed in the interpolar region of the left renal collecting system, and the distal loop performed in the lumen of the urinary bladder. Left kidney is otherwise normal in appearance. No hydroureteronephrosis. Mild diffuse bladder wall thickening, likely  in part related to underdistention of the bladder. No discrete bladder mass identified.  Stomach/Bowel: The appearance of the stomach is normal. No pathologic dilatation of small bowel or colon. Normal appendix.  Vascular/Lymphatic: Extensive lymphadenopathy is again noted throughout the abdomen and pelvis. The bulkiest retroperitoneal node or nodal conglomerate is in the left para-aortic nodal station immediately inferior to the left renal hilum, and this has decreased in size compared to the prior study, currently measuring 6.4 x 5.3 cm (image 25 of series 2), as compared with 8.8 x 6.9 cm on prior exam 11/09/2014. Bulky pelvic adenopathy is again noted bilaterally, generally similar to the prior study, with index lymph node in the left external iliac nodal station (image 57 of series 2) only slightly smaller than the prior examination, currently measuring 3.6 x 5.0 cm (previously 4.2 x 5.1 cm). However, some lymph nodes are larger than the prior examination, including a right external iliac lymph node, which currently measures 4.9 x 3.5 cm (image 59 of series 2) as compared with 4.7 x 3.0 cm on the prior study. Centrally low-attenuation presumably necrotic lymph node in the left common femoral nodal station measuring 3.4 x 4.8 cm. Extensive atherosclerosis throughout the abdominal and pelvic vasculature, without evidence of aneurysm or dissection.  Reproductive: Coarse calcifications are noted in the prostate gland (nonspecific). Seminal vesicles are unremarkable in appearance.  Other: No significant volume of ascites.  No pneumoperitoneum.  Musculoskeletal: Status post L4 laminectomy. There are no aggressive appearing lytic or blastic lesions noted in the visualized portions of the skeleton.  IMPRESSION: 1. Today's study generally demonstrates slight positive response to therapy, with generalized decrease of lymphadenopathy in the abdomen and pelvis. However, there several lymph nodes which are essentially  unchanged, and some lymph nodes which are slightly increased compared to the prior examination, as discussed above. 2. Additional findings, as above, similar to the prior examination.   Electronically Signed   By: Vinnie Langton M.D.   On: 11/25/2014 13:12   Ct Abdomen Pelvis W Contrast  11/09/2014   CLINICAL DATA:  Diffuse abdominal pain.  Chemotherapy yesterday.  EXAM: CT ABDOMEN AND PELVIS WITH CONTRAST  TECHNIQUE: Multidetector CT imaging of the abdomen and pelvis was performed using the standard protocol following bolus administration of intravenous contrast.  CONTRAST:  151mL OMNIPAQUE IOHEXOL 300 MG/ML  SOLN  COMPARISON:  10/23/2014  FINDINGS: BODY WALL: Partly visible left lower axillary lymphadenopathy, recently evaluated by chest CT 10/30/2014.  There is bulky bilateral inguinal lymphadenopathy which has increased from 10/23/2014, with left inguinal node on image 88 measuring 31 mm short axis, previously 23 mm  LOWER CHEST: Reticular opacities in the lower lungs, greater on the right, is favored atelectasis. No definitive pneumonia. Lower thoracic lymphadenopathy has increased, with a left retrocrural lymph node currently 15 mm on image 23, previously 10 mm.  ABDOMEN/PELVIS:  Liver: No focal abnormality.  Biliary: Cholelithiasis. The gallbladder is distended but there is  no inflammatory wall thickening.  Pancreas: Unremarkable.  Spleen: Mild splenomegaly, 15 cm in craniocaudal span. This is increased by approximately 4 cm since previous.  Adrenals: Negative.  Kidneys and ureters: Moderate bilateral hydroureteronephrosis to the level of the pelvis. This is despite an internal ureteral stent on the left which is well positioned. Symmetric renal enhancement.  Bladder: Thickening of the bladder wall, preferentially to the left, stable from previous. This is indeterminate and will be assessed on followup imaging.  Reproductive: No pathologic findings.  Bowel: No obstruction. No inflammatory bowel wall  thickening  Retroperitoneum: There is bulky and diffuse retroperitoneal lymphadenopathy. There has been significant and rapid increased from previous. Index left periaortic lymph node on image 39 measures 88 x 69 mm (previously 59 x 35 mm) on image 39). Left pelvic sidewall lymph node that was previously measured is on image 71 and stable at 58 x 41 mm. There is new enlargement of upper abdominal retroperitoneal lymph nodes . Some cystic change, presumably treatment related, present in the largest nodes, especially in the left pelvis.  Vascular: Mass effect on the left iliac veins by lymphadenopathy, with progressive subcutaneous edema in the left upper thigh.  OSSEOUS: No acute abnormalities.  IMPRESSION: 1. Rapid progression of lymphoma with marked enlargement of lymph nodes compared to 10/23/2014. 2. Bilateral hydroureteronephrosis, new on the right and unchanged on the left (despite a well-positioned left internal ureteral stent). 3. Progressive edema in the upper left thigh. Given severe compression of the left iliac vein by lymphadenopathy, consider Doppler to evaluate for DVT.   Electronically Signed   By: Monte Fantasia M.D.   On: 11/09/2014 02:13   US Renal  11/30/2014   CLINICAL DATA:  Lymphoma, urinary tract infection, previous placement of a left ureteral stent for obstructive lymphadenopathy and mass effect. Followup  EXAM: RENAL / URINARY TRACT ULTRASOUND COMPLETE  COMPARISON:  CT 11/25/2014  FINDINGS: Right Kidney:  Length: 12.4 cm. No mass or hydronephrosis. Mild cortical atrophy with increased cortical echogenicity.  Left Kidney:  Length: 14.3 cm. Improvement in now mild hydronephrosis. Mild left renal cortical thinning and increased echogenicity. Stent position not well visualized.  Bladder:  Concentric bladder wall thickening, 0.9 cm.  IMPRESSION: Improvement in now mild left hydronephrosis compared to the dissimilar prior exam.  Bilateral increased renal cortical echogenicity suggesting  medical renal disease with mild degree of cortical atrophy.  Concentric bladder wall thickening which is nonspecific and may be seen with urinary tract infection/ cystitis, infiltrative processes including lymphomatous involvement, or treatment effect.   Electronically Signed   By: Conchita Paris M.D.   On: 11/30/2014 11:24   [place] Extrem Low Left Comp  11/09/2014   CLINICAL DATA:  Left calf swelling and lower extremity pain. History of lymphoma.  EXAM: Left LOWER EXTREMITY VENOUS DOPPLER ULTRASOUND  TECHNIQUE: Gray-scale sonography with graded compression, as well as color Doppler and duplex ultrasound were performed to evaluate the lower extremity deep venous systems from the level of the common femoral vein and including the common femoral, femoral, profunda femoral, popliteal and calf veins including the posterior tibial, peroneal and gastrocnemius veins when visible. The superficial great saphenous vein was also interrogated. Spectral Doppler was utilized to evaluate flow at rest and with distal augmentation maneuvers in the common femoral, femoral and popliteal veins.  COMPARISON:  None.  FINDINGS: Contralateral Common Femoral Vein: Respiratory phasicity is normal and symmetric with the symptomatic side. No evidence of thrombus. Normal compressibility.  Common Femoral Vein: No evidence of  thrombus. Normal compressibility, respiratory phasicity and response to augmentation.  Saphenofemoral Junction - Superficial Great Saphenous Vein: Due to patient pain, the patient requested that the examination be terminated therefore the superficial femoral vein through popliteal trifurcation could not be evaluated.  IMPRESSION: No evidence of deep venous thrombosis involving the upper common femoral vein. The patient requested that the examination be terminated and did not allow the sonographer to examine the lower extremity venous system from the level of the saphenous femoral junction through the popliteal trifurcation.    Electronically Signed   By: Conchita Paris M.D.   On: 11/09/2014 09:40   Dg Chest Portable 1 View  11/29/2014   CLINICAL DATA:  Altered mental status. Possible reaction to chemotherapy.  EXAM: PORTABLE CHEST - 1 VIEW  COMPARISON:  11/15/2014  FINDINGS: Mild-to-moderate enlargement of the cardiopericardial silhouette with cephalization of blood flow. Faint Kerley B-lines noted at the right lung base, but no airspace edema.  Right IJ Port-A-Cath tip: SVC.  Tortuous thoracic aorta.  Deformity from prior surgical neck fracture the right humerus which appears healed.  IMPRESSION: Mild to moderate enlargement of the cardiopericardial silhouette with faint interstitial edema. No airspace opacity identified.   Electronically Signed   By: Van Clines M.D.   On: 11/29/2014 13:31   Dg Chest Port 1 View  11/15/2014   CLINICAL DATA:  Shortness of breath and cough.  Weakness.  EXAM: PORTABLE CHEST - 1 VIEW  COMPARISON:  11/08/2014  FINDINGS: Tip of the right chest port remains in the SVC. The heart is at the upper limits normal in size. There are increased perihilar markings, progressed from prior exam. Increased right basilar atelectasis, now with mild left basilar atelectasis. No definite pleural effusion or pneumothorax.  IMPRESSION: Increased perihilar markings concerning pulmonary edema. Increased right basilar atelectasis in development of mild left basilar atelectasis.   Electronically Signed   By: Jeb Levering M.D.   On: 11/15/2014 04:43   Dg Chest Port 1 View  11/08/2014   CLINICAL DATA:  Acute onset of sepsis.  Initial encounter.  EXAM: PORTABLE CHEST - 1 VIEW  COMPARISON:  Chest radiograph performed 11/12/2013, and CTA of the chest performed 10/30/2014  FINDINGS: The lungs are well-aerated. Mild vascular congestion is noted, with mild right basilar atelectasis. There is no evidence of pleural effusion or pneumothorax.  The cardiomediastinal silhouette is within normal limits. No acute osseous  abnormalities are seen. A right-sided chest port is noted ending about the mid SVC.  IMPRESSION: Mild vascular congestion, with mild right basilar atelectasis. Lungs otherwise clear.   Electronically Signed   By: Garald Balding M.D.   On: 11/08/2014 23:06     CBC  Recent Labs Lab 11/27/14 0827 11/28/14 0913 11/29/14 1318 11/30/14 0500 11/30/14 1922 12/01/14 0436  WBC 11.4* 11.9* 9.5 8.5  --  7.4  HGB 7.6* 7.1* 7.0* 6.6* 7.9* 8.2*  HCT 22.9* 21.7* 21.9* 20.2* 23.5* 24.8*  PLT 256 284 288 276  --  191  MCV 84.3 84.8 86.5 84.9  --  85.7  MCH 27.8 27.5 27.5 27.7  --  28.3  MCHC 32.9 32.5 31.8* 32.7  --  33.1  RDW 19.1* 19.7* 20.2* 19.8*  --  18.8*  LYMPHSABS  --  0.8* 0.4*  --   --   --   MONOABS  --  0.6 0.2  --   --   --   EOSABS  --  0.0 0.0  --   --   --  BASOSABS  --  0.0 0.0  --   --   --     Chemistries   Recent Labs Lab 11/27/14 0827  11/29/14 0834 11/29/14 1009 11/29/14 1318 11/30/14 0500 12/01/14 0436  NA 137  < > 137 137 139 142 146*  K 4.3  < > 4.8 4.8 4.7 4.5 3.6  CL 105  < > 108 108 112* 117* 123*  CO2 21*  < > 19* 19* 19* 21* 19*  GLUCOSE 111*  < > 141* 139* 127* 68 173*  BUN 12  < > 25* 24* 25* 28* 43*  CREATININE 1.26*  < > 1.20 1.24 1.21 1.29* 1.91*  CALCIUM 8.3*  < > 8.5* 8.4* 8.7* 8.6* 8.4*  AST 27  --   --  44* 43*  --   --   ALT 25  --   --  45 42  --   --   ALKPHOS 247*  --   --  283* 268*  --   --   BILITOT 0.8  --   --  0.5 0.6  --   --   < > = values in this interval not displayed. ------------------------------------------------------------------------------------------------------------------ estimated creatinine clearance is 39 mL/min (by C-G formula based on Cr of 1.91). ------------------------------------------------------------------------------------------------------------------ No results for input(s): HGBA1C in the last 72  hours. ------------------------------------------------------------------------------------------------------------------ No results for input(s): CHOL, HDL, LDLCALC, TRIG, CHOLHDL, LDLDIRECT in the last 72 hours. ------------------------------------------------------------------------------------------------------------------ No results for input(s): TSH, T4TOTAL, T3FREE, THYROIDAB in the last 72 hours.  Invalid input(s): FREET3 ------------------------------------------------------------------------------------------------------------------ No results for input(s): VITAMINB12, FOLATE, FERRITIN, TIBC, IRON, RETICCTPCT in the last 72 hours.  Coagulation profile  Recent Labs Lab 11/29/14 1318  INR 1.33    No results for input(s): DDIMER in the last 72 hours.  Cardiac Enzymes  Recent Labs Lab 11/29/14 1549 11/29/14 2121 11/30/14 0412  TROPONINI 0.06* 0.05* 0.07*   ------------------------------------------------------------------------------------------------------------------ Invalid input(s): POCBNP    Assessment & Plan   #1 sepsis: With 1 blood culture positive for gram-negative, as well as urine cultures growing Klebsiella sensitive to ceftriaxone which we will continue  #2 altered mental status: This is likely due to metabolic encephalopathy due to sepsis and urinary tract infection. Continue supportive care , continue Precedex  #3 chest pain: No EKG changes to suggest ischemia.  Continue to monitor for any further cardiac symptoms  #4 urinary tract infection: This patient has a history of bilateral malignant hydronephrosis which is improved   #5 Mantle cell lymphoma: He will need to follow-up with oncology for continued treatment if candidate, in light of this happening again with this chemotherapy   #6. Anemia: Due to chemotherapy and Mantle cell lymphoma,status post 2 units of packed RBCs   Prognosis seems to be very poorpalliative care consult     Code  Status Orders        Start     Ordered   11/29/14 1731  Full code   Continuous     11/29/14 1730       DNR per prvious admission     Consults  oncology  DVT Prophylaxis   SCDs   Lab Results  Component Value Date   PLT 191 12/01/2014     Time Spent in minutes critical care 30min    Dustin Flock M.D on 12/01/2014 at 10:38 AM  Between 7am to 6pm - Pager - [phone]  After 6pm go to [email or web address] - password [email or web address] [facility]   Office  [phone]

## 2014-12-02 ENCOUNTER — Inpatient Hospital Stay: Payer: Medicare Other

## 2014-12-02 LAB — GLUCOSE, CAPILLARY
GLUCOSE-CAPILLARY: 149 mg/dL — AB (ref 65–99)
GLUCOSE-CAPILLARY: 156 mg/dL — AB (ref 65–99)

## 2014-12-02 LAB — URINE CULTURE: Culture: NO GROWTH

## 2014-12-02 LAB — BASIC METABOLIC PANEL
ANION GAP: 10 (ref 5–15)
BUN: 56 mg/dL — ABNORMAL HIGH (ref 6–20)
CALCIUM: 8.7 mg/dL — AB (ref 8.9–10.3)
CO2: 18 mmol/L — AB (ref 22–32)
Chloride: 126 mmol/L — ABNORMAL HIGH (ref 101–111)
Creatinine, Ser: 2.18 mg/dL — ABNORMAL HIGH (ref 0.61–1.24)
GFR calc non Af Amer: 30 mL/min — ABNORMAL LOW (ref >60)
GFR, EST AFRICAN AMERICAN: 34 mL/min — AB (ref >60)
Glucose, Bld: 161 mg/dL — ABNORMAL HIGH (ref 65–99)
Potassium: 3.1 mmol/L — ABNORMAL LOW (ref 3.5–5.1)
SODIUM: 154 mmol/L — AB (ref 135–145)

## 2014-12-02 LAB — CBC
HCT: 23.8 % — ABNORMAL LOW (ref 40.0–52.0)
HEMOGLOBIN: 7.7 g/dL — AB (ref 13.0–18.0)
MCH: 28 pg (ref 26.0–34.0)
MCHC: 32.5 g/dL (ref 32.0–36.0)
MCV: 86.2 fL (ref 80.0–100.0)
Platelets: 123 10*3/uL — ABNORMAL LOW (ref 150–440)
RBC: 2.76 MIL/uL — ABNORMAL LOW (ref 4.40–5.90)
RDW: 19 % — ABNORMAL HIGH (ref 11.5–14.5)
WBC: 6.3 10*3/uL (ref 3.8–10.6)

## 2014-12-02 MED ORDER — DIAZEPAM 5 MG/ML IJ SOLN
2.5000 mg | Freq: Once | INTRAMUSCULAR | Status: AC
  Administered 2014-12-02: 2.5 mg via INTRAVENOUS

## 2014-12-02 MED ORDER — POTASSIUM CHLORIDE 10 MEQ/100ML IV SOLN
10.0000 meq | INTRAVENOUS | Status: AC
  Administered 2014-12-02 (×2): 10 meq via INTRAVENOUS
  Filled 2014-12-02 (×2): qty 100

## 2014-12-02 MED ORDER — DIAZEPAM 5 MG/ML IJ SOLN
2.5000 mg | Freq: Four times a day (QID) | INTRAMUSCULAR | Status: DC
  Administered 2014-12-02 – 2014-12-03 (×3): 2.5 mg via INTRAVENOUS
  Filled 2014-12-02 (×5): qty 2

## 2014-12-02 MED ORDER — LORAZEPAM 2 MG/ML IJ SOLN
0.5000 mg | INTRAMUSCULAR | Status: DC | PRN
  Administered 2014-12-02 (×2): 0.5 mg via INTRAVENOUS
  Administered 2014-12-03 (×2): 1 mg via INTRAVENOUS
  Filled 2014-12-02 (×4): qty 1

## 2014-12-02 NOTE — Clinical Social Work Note (Signed)
Clinical Social Work Assessment  Patient Details  Name: Caleb Francis MRN: [id number] Date of Birth: 1945/06/16  Date of referral:  12/02/14               Reason for consult:  Facility Placement                Permission sought to share information with:    Permission granted to share information::     Name::        Agency::     Relationship::     Contact Information:     Housing/Transportation Living arrangements for the past 2 months:  [place] of Information:  Spouse Patient Interpreter Needed:  None Criminal Activity/Legal Involvement Pertinent to Current Situation/Hospitalization:  No - Comment as needed Significant Relationships:    Lives with:  Spouse Do you feel safe going back to the place where you live?  Yes Need for family participation in patient care:  Yes (Comment)  Care giving concerns:  None as patient is currently at [facility] for [facility].   Social Worker assessment / plan:  CSW met with patient's wife this morning as she was sitting at patient's bedside. Patient was sleeping and did not awaken through our discussion. Patient's wife expressed being distraught today due to the fact that patient had been doing so well and was even doing well at the beginning of his chemo treatment. By the end of the treatment he had taken a decline and is currently admitted to ICU. Patient's wife wishes for patient when time to discharge back to [facility]. CSW will confirm with [facility] that he can return. FL2 placed on patient's chart.  Employment status:  Retired Nurse, adult PT Recommendations:  Not assessed at this time Information / Referral to community resources:  [place]  Patient/Family's Response to care:  Patient's wife is concerned for patient's well being.  Patient/Family's Understanding of and Emotional Response to Diagnosis, Current Treatment, and Prognosis:  Patient's wife  expressed gratitude for CSW visit and support.   Emotional Assessment Appearance:  Appears stated age Attitude/Demeanor/Rapport:   (pleasant and cooperative) Affect (typically observed):    Orientation:    Alcohol / Substance use:  Not Applicable Psych involvement (Current and /or in the community):  No (Comment)  Discharge Needs  Concerns to be addressed:   (patient at [facility] for [facility]) Readmission within the last 30 days:  No Current discharge risk:  None Barriers to Discharge:  No Barriers Identified   Shela Leff, LCSW 12/02/2014, 9:56 AM

## 2014-12-02 NOTE — Progress Notes (Signed)
Informed Dr Mortimer Fries pt will not be monitored on portable EKG during MRI pt agitated with delirium per Dr Mortimer Fries plans were to administer 2.5 mg iv valium once and 0.5 mg of ativan iv push once in addition to precedex infusing to decrease agitation and combativeness during MRI. Clarified with MD if he wanted pt to have MRI with the administration of medications listed above without the ability to monitor pt on portable EKG Dr. Mortimer Fries stated he wanted pt to have MRI today. Notified MRI and they stated they needed to contact radiologist for approval

## 2014-12-02 NOTE — Progress Notes (Signed)
Pt remains agitated and delirious currently on precedex drip with prn ativan and scheduled valium administered ativan pt now resting comfortably; adequate uop; sinus rhythm on cardiac monitor; pt transported to MRI results reported to Dr. Mortimer Fries; sitter remains at bedside for pt safety; neurology consult placed per Dr Zoila Shutter verbal orders; pts family updated about plan of care and questions answered will continue to monitor and assess pt

## 2014-12-02 NOTE — Progress Notes (Signed)
[facility] [facility]                                                                                                                                                                                            Patient Demographics   Caleb Francis, is a 69 y.o. male, DOB - 1946/01/15, BTD:[id number]  Admit date - 11/29/2014   Admitting Physician Aldean Jewett, MD  Outpatient Primary MD for the patient is No PCP Per Patient   LOS - 3  Subjective: Patient continues to be on Precedex drip, currently use sleeping/sedated   Review of Systems:   Unable to provide due to patient being sleepy. Sedated  Vitals:   Filed Vitals:   12/02/14 0700 12/02/14 0800 12/02/14 0900 12/02/14 1000  BP: 135/74  139/78 141/71  Pulse: 96 94 93 88  Temp:  98.6 F (37 C)    TempSrc:  Axillary    Resp: 34 42 35 26  Height:      Weight:      SpO2: 100% 100% 100% 100%    Wt Readings from Last 3 Encounters:  11/29/14 73.483 kg (162 lb)  11/27/14 68.2 kg (150 lb 5.7 oz)  11/25/14 70.761 kg (156 lb)     Intake/Output Summary (Last 24 hours) at 12/02/14 1116 Last data filed at 12/02/14 1062  Gross per 24 hour  Intake 4834.34 ml  Output    375 ml  Net 4459.34 ml    Physical Exam:   GENERAL: Critically ill-appearing male confused HEAD, EYES, EARS, NOSE AND THROAT: Atraumatic, normocephalic. Extraocular muscles are intact. Pupils equal and reactive to light. Sclerae anicteric. No conjunctival injection. No oro-pharyngeal erythema.  NECK: Supple. There is no jugular venous distention. No bruits, no lymphadenopathy, no thyromegaly.  HEART:  tachycardic. No murmurs, no rubs, no clicks.  LUNGS: Clear to auscultation bilaterally. No rales or rhonchi. No wheezes.  ABDOMEN: Soft, flat, nontender, nondistended. Has good bowel sounds. No hepatosplenomegaly appreciated.  EXTREMITIES: No evidence of any cyanosis, clubbing, or peripheral edema.  +2 pedal and radial  pulses bilaterally.  NEUROLOGIC: Drowsy SKIN: Moist and warm with no rashes appreciated.  Psych:  anxious,  LN: No inguinal LN enlargement    Antibiotics   Anti-infectives    Start     Dose/Rate Route Frequency Ordered Stop   12/01/14 0945  piperacillin-tazobactam (ZOSYN) IVPB 3.375 g     3.375 g 12.5 mL/hr over 240 Minutes Intravenous 3 times per day 12/01/14 0936     11/30/14 1000  cefTRIAXone (ROCEPHIN) 2 g in dextrose 5 % 50 mL IVPB - Premix  Comments:  Entered per MD progress note   2 g 100 mL/hr over 30 Minutes Intravenous Every 24 hours 11/29/14 2157     11/29/14 1415  cefTRIAXone (ROCEPHIN) 1 g in dextrose 5 % 50 mL IVPB - Premix     1 g 100 mL/hr over 30 Minutes Intravenous  Once 11/29/14 1406 11/29/14 1452   11/29/14 1414  cefTRIAXone (ROCEPHIN) 20 MG/ML IVPB 50 mL    Comments:  MONAR, NELLIE: cabinet override      11/29/14 1414 11/29/14 1424      Medications   Scheduled Meds: . antiseptic oral rinse  7 mL Mouth Rinse BID  . aspirin  324 mg Oral Once  . cefTRIAXone (ROCEPHIN)  IV  2 g Intravenous Q24H  . diazepam  2.5 mg Intravenous Q6H  . filgrastim  480 mcg Subcutaneous Once  . heparin  5,000 Units Subcutaneous 3 times per day  . piperacillin-tazobactam (ZOSYN)  IV  3.375 g Intravenous 3 times per day  . sodium chloride  3 mL Intravenous Q12H   Continuous Infusions: . dexmedetomidine 0.6 mcg/kg/hr (12/02/14 1108)  . dextrose 5 % and 0.9% NaCl 100 mL/hr at 12/02/14 0500   PRN Meds:.acetaminophen **OR** acetaminophen, haloperidol lactate, HYDROmorphone (DILAUDID) injection, LORazepam, magnesium hydroxide   Data Review:   Micro Results Recent Results (from the past 240 hour(s))  Culture, blood (single)     Status: None (Preliminary result)   Collection Time: 11/29/14 11:09 AM  Result Value Ref Range Status   Specimen Description BLOOD  Final   Special Requests NONE  Final   Culture  Setup Time   Final    GRAM NEGATIVE RODS AEROBIC BOTTLE  ONLY CRITICAL RESULT CALLED TO, READ BACK BY AND VERIFIED WITH: SANDRA VORBA ON 11/30/14 AT 0825 BY JEF    Culture KLEBSIELLA OXYTOCA AEROBIC BOTTLE ONLY   Final   Report Status PENDING  Incomplete   Organism ID, Bacteria KLEBSIELLA OXYTOCA  Final      Susceptibility   Klebsiella oxytoca - MIC*    AMPICILLIN >=32 RESISTANT Resistant     CEFTAZIDIME <=1 SENSITIVE Sensitive     CEFAZOLIN >=64 RESISTANT Resistant     CEFTRIAXONE <=1 SENSITIVE Sensitive     CIPROFLOXACIN <=0.25 SENSITIVE Sensitive     GENTAMICIN <=1 SENSITIVE Sensitive     IMIPENEM <=0.25 SENSITIVE Sensitive     TRIMETH/SULFA <=20 SENSITIVE Sensitive     CEFOXITIN <=4 SENSITIVE Sensitive     * KLEBSIELLA OXYTOCA  Urine culture     Status: None   Collection Time: 11/29/14 11:22 AM  Result Value Ref Range Status   Specimen Description URINE, CLEAN CATCH  Final   Special Requests NONE  Final   Culture >=100,000 COLONIES/mL KLEBSIELLA OXYTOCA  Final   Report Status 12/01/2014 FINAL  Final   Organism ID, Bacteria KLEBSIELLA OXYTOCA  Final      Susceptibility   Klebsiella oxytoca - MIC*    AMPICILLIN >=32 RESISTANT Resistant     CEFAZOLIN >=64 RESISTANT Resistant     CEFTRIAXONE 4 SENSITIVE Sensitive     CIPROFLOXACIN <=0.25 SENSITIVE Sensitive     GENTAMICIN <=1 SENSITIVE Sensitive     IMIPENEM <=0.25 SENSITIVE Sensitive     NITROFURANTOIN <=16 SENSITIVE Sensitive     TRIMETH/SULFA <=20 SENSITIVE Sensitive     CEFOXITIN <=4 SENSITIVE Sensitive     * >=100,000 COLONIES/mL KLEBSIELLA OXYTOCA  MRSA PCR Screening     Status: None   Collection Time: 11/29/14 12:13 PM  Result  Value Ref Range Status   MRSA by PCR NEGATIVE NEGATIVE Final    Comment:        The GeneXpert MRSA Assay (FDA approved for NASAL specimens only), is one component of a comprehensive MRSA colonization surveillance program. It is not intended to diagnose MRSA infection nor to guide or monitor treatment for MRSA infections.   Culture, blood  (routine x 2)     Status: None (Preliminary result)   Collection Time: 11/29/14  1:19 PM  Result Value Ref Range Status   Specimen Description BLOOD  Final   Special Requests Immunocompromised  Final   Culture NO GROWTH 3 DAYS  Final   Report Status PENDING  Incomplete  Urine culture     Status: None   Collection Time: 11/30/14 10:12 AM  Result Value Ref Range Status   Specimen Description URINE, CATHETERIZED  Final   Special Requests Immunocompromised  Final   Culture NO GROWTH 2 DAYS  Final   Report Status 12/02/2014 FINAL  Final    Radiology Reports Ct Head Wo Contrast  11/29/2014   CLINICAL DATA:  Concern for chemotherapy adverse reaction. Patient transfer from cancer center. Altered mental status.  EXAM: CT HEAD WITHOUT CONTRAST  TECHNIQUE: Contiguous axial images were obtained from the base of the skull through the vertex without intravenous contrast.  COMPARISON:  Head CT 11/09/2014  FINDINGS: No acute intracranial hemorrhage. No focal mass lesion. No CT evidence of acute infarction. No midline shift or mass effect. No hydrocephalus. Basilar cisterns are patent.  There is mild generalized cortical atrophy and proportional ventricular dilatation. Mild periventricular subcortical white matter hypodensities.  Paranasal sinuses and  mastoid air cells are clear.  IMPRESSION: 1. No acute intracranial findings.  No change from prior. 2. Mild atrophy and white matter microvascular disease.   Electronically Signed   By: Suzy Bouchard M.D.   On: 11/29/2014 13:48   Ct Head Wo Contrast  11/09/2014   CLINICAL DATA:  Degenerating mental status, sepsis and history of progressive mantle cell lymphoma.  EXAM: CT HEAD WITHOUT CONTRAST  TECHNIQUE: Contiguous axial images were obtained from the base of the skull through the vertex without intravenous contrast.  COMPARISON:  05/21/2009  FINDINGS: No findings by unenhanced head CT to suggest brain involvement by lymphoma. The brain demonstrates no evidence  of hemorrhage, infarction, edema, mass effect, extra-axial fluid collection, hydrocephalus or mass lesion. The skull is unremarkable.  IMPRESSION: Unremarkable head CT.   Electronically Signed   By: Aletta Edouard M.D.   On: 11/09/2014 13:30   Ct Abdomen Pelvis W Contrast  11/25/2014   CLINICAL DATA:  69 year old male with history of mantle cell lymphoma recently treated with chemotherapy.  EXAM: CT ABDOMEN AND PELVIS WITH CONTRAST  TECHNIQUE: Multidetector CT imaging of the abdomen and pelvis was performed using the standard protocol following bolus administration of intravenous contrast.  CONTRAST:  34mL OMNIPAQUE IOHEXOL 300 MG/ML  SOLN  COMPARISON:  CT of the abdomen and pelvis 11/01/2014.  FINDINGS: Lower chest: Retrocrural lymph nodes are again noted bilaterally, largest of which on the right measures 1.6 cm in short axis, similar to the prior examination. Small amount of pericardial fluid and/or thickening, unlikely to be of any hemodynamic significance at this time.  Hepatobiliary: Numerous tiny partially calcified gallstones are noted within the gallbladder. No findings to suggest acute cholecystitis at this time. No significant cystic or solid hepatic lesions. No intra or extrahepatic biliary ductal dilatation.  Pancreas: No pancreatic mass. No pancreatic ductal  dilatation. No pancreatic or peripancreatic fluid or inflammatory changes.  Spleen: Spleen remains mildly enlarged, but is smaller than the prior examination, currently measuring up to 13.2 cm in length.  Adrenals/Urinary Tract: Thickening of the left adrenal gland is unchanged. Right adrenal gland is normal. Right kidney is normal in appearance. There is a left-sided double-J ureteral stent in position, with the proximal loop reformed in the interpolar region of the left renal collecting system, and the distal loop performed in the lumen of the urinary bladder. Left kidney is otherwise normal in appearance. No hydroureteronephrosis. Mild  diffuse bladder wall thickening, likely in part related to underdistention of the bladder. No discrete bladder mass identified.  Stomach/Bowel: The appearance of the stomach is normal. No pathologic dilatation of small bowel or colon. Normal appendix.  Vascular/Lymphatic: Extensive lymphadenopathy is again noted throughout the abdomen and pelvis. The bulkiest retroperitoneal node or nodal conglomerate is in the left para-aortic nodal station immediately inferior to the left renal hilum, and this has decreased in size compared to the prior study, currently measuring 6.4 x 5.3 cm (image 25 of series 2), as compared with 8.8 x 6.9 cm on prior exam 11/09/2014. Bulky pelvic adenopathy is again noted bilaterally, generally similar to the prior study, with index lymph node in the left external iliac nodal station (image 57 of series 2) only slightly smaller than the prior examination, currently measuring 3.6 x 5.0 cm (previously 4.2 x 5.1 cm). However, some lymph nodes are larger than the prior examination, including a right external iliac lymph node, which currently measures 4.9 x 3.5 cm (image 59 of series 2) as compared with 4.7 x 3.0 cm on the prior study. Centrally low-attenuation presumably necrotic lymph node in the left common femoral nodal station measuring 3.4 x 4.8 cm. Extensive atherosclerosis throughout the abdominal and pelvic vasculature, without evidence of aneurysm or dissection.  Reproductive: Coarse calcifications are noted in the prostate gland (nonspecific). Seminal vesicles are unremarkable in appearance.  Other: No significant volume of ascites.  No pneumoperitoneum.  Musculoskeletal: Status post L4 laminectomy. There are no aggressive appearing lytic or blastic lesions noted in the visualized portions of the skeleton.  IMPRESSION: 1. Today's study generally demonstrates slight positive response to therapy, with generalized decrease of lymphadenopathy in the abdomen and pelvis. However, there several  lymph nodes which are essentially unchanged, and some lymph nodes which are slightly increased compared to the prior examination, as discussed above. 2. Additional findings, as above, similar to the prior examination.   Electronically Signed   By: Vinnie Langton M.D.   On: 11/25/2014 13:12   Ct Abdomen Pelvis W Contrast  11/09/2014   CLINICAL DATA:  Diffuse abdominal pain.  Chemotherapy yesterday.  EXAM: CT ABDOMEN AND PELVIS WITH CONTRAST  TECHNIQUE: Multidetector CT imaging of the abdomen and pelvis was performed using the standard protocol following bolus administration of intravenous contrast.  CONTRAST:  174mL OMNIPAQUE IOHEXOL 300 MG/ML  SOLN  COMPARISON:  10/23/2014  FINDINGS: BODY WALL: Partly visible left lower axillary lymphadenopathy, recently evaluated by chest CT 10/30/2014.  There is bulky bilateral inguinal lymphadenopathy which has increased from 10/23/2014, with left inguinal node on image 88 measuring 31 mm short axis, previously 23 mm  LOWER CHEST: Reticular opacities in the lower lungs, greater on the right, is favored atelectasis. No definitive pneumonia. Lower thoracic lymphadenopathy has increased, with a left retrocrural lymph node currently 15 mm on image 23, previously 10 mm.  ABDOMEN/PELVIS:  Liver: No focal abnormality.  Biliary:  Cholelithiasis. The gallbladder is distended but there is no inflammatory wall thickening.  Pancreas: Unremarkable.  Spleen: Mild splenomegaly, 15 cm in craniocaudal span. This is increased by approximately 4 cm since previous.  Adrenals: Negative.  Kidneys and ureters: Moderate bilateral hydroureteronephrosis to the level of the pelvis. This is despite an internal ureteral stent on the left which is well positioned. Symmetric renal enhancement.  Bladder: Thickening of the bladder wall, preferentially to the left, stable from previous. This is indeterminate and will be assessed on followup imaging.  Reproductive: No pathologic findings.  Bowel: No  obstruction. No inflammatory bowel wall thickening  Retroperitoneum: There is bulky and diffuse retroperitoneal lymphadenopathy. There has been significant and rapid increased from previous. Index left periaortic lymph node on image 39 measures 88 x 69 mm (previously 59 x 35 mm) on image 39). Left pelvic sidewall lymph node that was previously measured is on image 71 and stable at 58 x 41 mm. There is new enlargement of upper abdominal retroperitoneal lymph nodes . Some cystic change, presumably treatment related, present in the largest nodes, especially in the left pelvis.  Vascular: Mass effect on the left iliac veins by lymphadenopathy, with progressive subcutaneous edema in the left upper thigh.  OSSEOUS: No acute abnormalities.  IMPRESSION: 1. Rapid progression of lymphoma with marked enlargement of lymph nodes compared to 10/23/2014. 2. Bilateral hydroureteronephrosis, new on the right and unchanged on the left (despite a well-positioned left internal ureteral stent). 3. Progressive edema in the upper left thigh. Given severe compression of the left iliac vein by lymphadenopathy, consider Doppler to evaluate for DVT.   Electronically Signed   By: Monte Fantasia M.D.   On: 11/09/2014 02:13   US Renal  11/30/2014   CLINICAL DATA:  Lymphoma, urinary tract infection, previous placement of a left ureteral stent for obstructive lymphadenopathy and mass effect. Followup  EXAM: RENAL / URINARY TRACT ULTRASOUND COMPLETE  COMPARISON:  CT 11/25/2014  FINDINGS: Right Kidney:  Length: 12.4 cm. No mass or hydronephrosis. Mild cortical atrophy with increased cortical echogenicity.  Left Kidney:  Length: 14.3 cm. Improvement in now mild hydronephrosis. Mild left renal cortical thinning and increased echogenicity. Stent position not well visualized.  Bladder:  Concentric bladder wall thickening, 0.9 cm.  IMPRESSION: Improvement in now mild left hydronephrosis compared to the dissimilar prior exam.  Bilateral increased  renal cortical echogenicity suggesting medical renal disease with mild degree of cortical atrophy.  Concentric bladder wall thickening which is nonspecific and may be seen with urinary tract infection/ cystitis, infiltrative processes including lymphomatous involvement, or treatment effect.   Electronically Signed   By: Conchita Paris M.D.   On: 11/30/2014 11:24   [place] Extrem Low Left Comp  11/09/2014   CLINICAL DATA:  Left calf swelling and lower extremity pain. History of lymphoma.  EXAM: Left LOWER EXTREMITY VENOUS DOPPLER ULTRASOUND  TECHNIQUE: Gray-scale sonography with graded compression, as well as color Doppler and duplex ultrasound were performed to evaluate the lower extremity deep venous systems from the level of the common femoral vein and including the common femoral, femoral, profunda femoral, popliteal and calf veins including the posterior tibial, peroneal and gastrocnemius veins when visible. The superficial great saphenous vein was also interrogated. Spectral Doppler was utilized to evaluate flow at rest and with distal augmentation maneuvers in the common femoral, femoral and popliteal veins.  COMPARISON:  None.  FINDINGS: Contralateral Common Femoral Vein: Respiratory phasicity is normal and symmetric with the symptomatic side. No evidence of thrombus. Normal  compressibility.  Common Femoral Vein: No evidence of thrombus. Normal compressibility, respiratory phasicity and response to augmentation.  Saphenofemoral Junction - Superficial Great Saphenous Vein: Due to patient pain, the patient requested that the examination be terminated therefore the superficial femoral vein through popliteal trifurcation could not be evaluated.  IMPRESSION: No evidence of deep venous thrombosis involving the upper common femoral vein. The patient requested that the examination be terminated and did not allow the sonographer to examine the lower extremity venous system from the level of the saphenous femoral  junction through the popliteal trifurcation.   Electronically Signed   By: Conchita Paris M.D.   On: 11/09/2014 09:40   Dg Chest Portable 1 View  11/29/2014   CLINICAL DATA:  Altered mental status. Possible reaction to chemotherapy.  EXAM: PORTABLE CHEST - 1 VIEW  COMPARISON:  11/15/2014  FINDINGS: Mild-to-moderate enlargement of the cardiopericardial silhouette with cephalization of blood flow. Faint Kerley B-lines noted at the right lung base, but no airspace edema.  Right IJ Port-A-Cath tip: SVC.  Tortuous thoracic aorta.  Deformity from prior surgical neck fracture the right humerus which appears healed.  IMPRESSION: Mild to moderate enlargement of the cardiopericardial silhouette with faint interstitial edema. No airspace opacity identified.   Electronically Signed   By: Van Clines M.D.   On: 11/29/2014 13:31   Dg Chest Port 1 View  11/15/2014   CLINICAL DATA:  Shortness of breath and cough.  Weakness.  EXAM: PORTABLE CHEST - 1 VIEW  COMPARISON:  11/08/2014  FINDINGS: Tip of the right chest port remains in the SVC. The heart is at the upper limits normal in size. There are increased perihilar markings, progressed from prior exam. Increased right basilar atelectasis, now with mild left basilar atelectasis. No definite pleural effusion or pneumothorax.  IMPRESSION: Increased perihilar markings concerning pulmonary edema. Increased right basilar atelectasis in development of mild left basilar atelectasis.   Electronically Signed   By: Jeb Levering M.D.   On: 11/15/2014 04:43   Dg Chest Port 1 View  11/08/2014   CLINICAL DATA:  Acute onset of sepsis.  Initial encounter.  EXAM: PORTABLE CHEST - 1 VIEW  COMPARISON:  Chest radiograph performed 11/12/2013, and CTA of the chest performed 10/30/2014  FINDINGS: The lungs are well-aerated. Mild vascular congestion is noted, with mild right basilar atelectasis. There is no evidence of pleural effusion or pneumothorax.  The cardiomediastinal silhouette  is within normal limits. No acute osseous abnormalities are seen. A right-sided chest port is noted ending about the mid SVC.  IMPRESSION: Mild vascular congestion, with mild right basilar atelectasis. Lungs otherwise clear.   Electronically Signed   By: Garald Balding M.D.   On: 11/08/2014 23:06     CBC  Recent Labs Lab 11/28/14 0913 11/29/14 1318 11/30/14 0500 11/30/14 1922 12/01/14 0436 12/02/14 0353  WBC 11.9* 9.5 8.5  --  7.4 6.3  HGB 7.1* 7.0* 6.6* 7.9* 8.2* 7.7*  HCT 21.7* 21.9* 20.2* 23.5* 24.8* 23.8*  PLT 284 288 276  --  191 123*  MCV 84.8 86.5 84.9  --  85.7 86.2  MCH 27.5 27.5 27.7  --  28.3 28.0  MCHC 32.5 31.8* 32.7  --  33.1 32.5  RDW 19.7* 20.2* 19.8*  --  18.8* 19.0*  LYMPHSABS 0.8* 0.4*  --   --   --   --   MONOABS 0.6 0.2  --   --   --   --   EOSABS 0.0 0.0  --   --   --   --  BASOSABS 0.0 0.0  --   --   --   --     Chemistries   Recent Labs Lab 11/27/14 0827  11/29/14 1009 11/29/14 1318 11/30/14 0500 12/01/14 0436 12/02/14 0353  NA 137  < > 137 139 142 146* 154*  K 4.3  < > 4.8 4.7 4.5 3.6 3.1*  CL 105  < > 108 112* 117* 123* 126*  CO2 21*  < > 19* 19* 21* 19* 18*  GLUCOSE 111*  < > 139* 127* 68 173* 161*  BUN 12  < > 24* 25* 28* 43* 56*  CREATININE 1.26*  < > 1.24 1.21 1.29* 1.91* 2.18*  CALCIUM 8.3*  < > 8.4* 8.7* 8.6* 8.4* 8.7*  AST 27  --  44* 43*  --   --   --   ALT 25  --  45 42  --   --   --   ALKPHOS 247*  --  283* 268*  --   --   --   BILITOT 0.8  --  0.5 0.6  --   --   --   < > = values in this interval not displayed. ------------------------------------------------------------------------------------------------------------------ estimated creatinine clearance is 34.2 mL/min (by C-G formula based on Cr of 2.18). ------------------------------------------------------------------------------------------------------------------ No results for input(s): HGBA1C in the last 72  hours. ------------------------------------------------------------------------------------------------------------------ No results for input(s): CHOL, HDL, LDLCALC, TRIG, CHOLHDL, LDLDIRECT in the last 72 hours. ------------------------------------------------------------------------------------------------------------------ No results for input(s): TSH, T4TOTAL, T3FREE, THYROIDAB in the last 72 hours.  Invalid input(s): FREET3 ------------------------------------------------------------------------------------------------------------------ No results for input(s): VITAMINB12, FOLATE, FERRITIN, TIBC, IRON, RETICCTPCT in the last 72 hours.  Coagulation profile  Recent Labs Lab 11/29/14 1318  INR 1.33    No results for input(s): DDIMER in the last 72 hours.  Cardiac Enzymes  Recent Labs Lab 11/29/14 1549 11/29/14 2121 11/30/14 0412  TROPONINI 0.06* 0.05* 0.07*   ------------------------------------------------------------------------------------------------------------------ Invalid input(s): POCBNP    Assessment & Plan   #1 sepsis: With 1 blood culture positive for gram-negative, as well as urine cultures growing Klebsiella sensitive to ceftriaxone and continue for now   #2 This is likely due to metabolic encephalopathy due to sepsis and urinary tract infection. Continue supportive care , continue Precedex, neurology consult   #3 chest pain: No EKG changes to suggest ischemia.  Continue to monitor for any further cardiac symptoms  #4 urinary tract infection: This patient has a history of bilateral malignant hydronephrosis which is improved   #5 Mantle cell lymphoma: He will need to follow-up with oncology for continued treatment   #6. Anemia: Due to chemotherapy and Mantle cell lymphoma,status post 2 units of packed RBCs hemoglobin trending down   Prognosis seems to be very poor palliative care consult     Code Status Orders        Start     Ordered    11/29/14 1731  Full code   Continuous     11/29/14 1730       DNR per prvious admission     Consults  oncology  DVT Prophylaxis   SCDs   Lab Results  Component Value Date   PLT 123* 12/02/2014     Time Spent in minutes critical care 86min    Dustin Flock M.D on 12/02/2014 at 11:16 AM  Between 7am to 6pm - Pager - [phone]  After 6pm go to [email or web address] - password [email or web address] [facility]   Office  [phone]

## 2014-12-02 NOTE — Consult Note (Signed)
Pt down getting MRI. Will see in AM.

## 2014-12-02 NOTE — Progress Notes (Signed)
New orders placed by pharmacy for 2 potassium runs without recheck of potassium. Verified with pharmacy and pharmacist confirmed to administer potassium runs.

## 2014-12-03 ENCOUNTER — Inpatient Hospital Stay: Payer: Medicare Other

## 2014-12-03 ENCOUNTER — Inpatient Hospital Stay: Payer: Medicare Other | Admitting: Hematology and Oncology

## 2014-12-03 DIAGNOSIS — Z515 Encounter for palliative care: Secondary | ICD-10-CM

## 2014-12-03 DIAGNOSIS — Z66 Do not resuscitate: Secondary | ICD-10-CM

## 2014-12-03 DIAGNOSIS — R590 Localized enlarged lymph nodes: Secondary | ICD-10-CM

## 2014-12-03 DIAGNOSIS — D61818 Other pancytopenia: Secondary | ICD-10-CM

## 2014-12-03 DIAGNOSIS — E870 Hyperosmolality and hypernatremia: Secondary | ICD-10-CM

## 2014-12-03 DIAGNOSIS — R400 Somnolence: Secondary | ICD-10-CM

## 2014-12-03 LAB — CBC
HEMATOCRIT: 20.8 % — AB (ref 39.0–52.0)
HEMATOCRIT: 22.4 % — AB (ref 40.0–52.0)
HEMOGLOBIN: 6.8 g/dL — AB (ref 13.0–17.0)
HEMOGLOBIN: 7.3 g/dL — AB (ref 13.0–18.0)
MCH: 28.3 pg (ref 26.0–34.0)
MCH: 28.4 pg (ref 26.0–34.0)
MCHC: 32.9 g/dL (ref 30.0–36.0)
MCHC: 32.4 g/dL (ref 32.0–36.0)
MCV: 86.1 fL (ref 78.0–100.0)
MCV: 87.8 fL (ref 80.0–100.0)
Platelets: 57 10*3/uL — ABNORMAL LOW (ref 150–440)
RBC: 2.41 MIL/uL — ABNORMAL LOW (ref 4.22–5.81)
RBC: 2.55 MIL/uL — ABNORMAL LOW (ref 4.40–5.90)
RDW: 19 % — AB (ref 11.5–15.5)
RDW: 19.5 % — ABNORMAL HIGH (ref 11.5–14.5)
WBC: 3.6 10*3/uL — AB (ref 4.0–10.5)
WBC: 3.7 10*3/uL — AB (ref 3.8–10.6)

## 2014-12-03 LAB — URIC ACID: Uric Acid, Serum: 7.3 mg/dL (ref 4.4–7.6)

## 2014-12-03 LAB — MAGNESIUM
Magnesium: 2.6 mg/dL — ABNORMAL HIGH (ref 1.7–2.4)
Magnesium: 2.5 mg/dL — ABNORMAL HIGH (ref 1.7–2.4)

## 2014-12-03 LAB — BASIC METABOLIC PANEL
BUN: 72 mg/dL — ABNORMAL HIGH (ref 6–20)
BUN: 69 mg/dL — AB (ref 6–20)
CALCIUM: 8.3 mg/dL — AB (ref 8.9–10.3)
CO2: 18 mmol/L — AB (ref 22–32)
CO2: 17 mmol/L — ABNORMAL LOW (ref 22–32)
CREATININE: 2.61 mg/dL — AB (ref 0.61–1.24)
CREATININE: 2.58 mg/dL — AB (ref 0.61–1.24)
Calcium: 8.9 mg/dL (ref 8.9–10.3)
Chloride: >130 mmol/L (ref 101–111)
Chloride: >130 mmol/L (ref 101–111)
GFR calc Af Amer: 28 mL/min — ABNORMAL LOW (ref >60)
GFR, EST AFRICAN AMERICAN: 28 mL/min — AB (ref >60)
GFR, EST NON AFRICAN AMERICAN: 24 mL/min — AB (ref >60)
GFR, EST NON AFRICAN AMERICAN: 24 mL/min — AB (ref >60)
GLUCOSE: 191 mg/dL — AB (ref 65–99)
Glucose, Bld: 175 mg/dL — ABNORMAL HIGH (ref 65–99)
Potassium: 2.9 mmol/L — CL (ref 3.5–5.1)
Potassium: 3.2 mmol/L — ABNORMAL LOW (ref 3.5–5.1)
Sodium: 160 mmol/L — ABNORMAL HIGH (ref 135–145)
Sodium: 165 mmol/L (ref 135–145)

## 2014-12-03 LAB — PHOSPHORUS
PHOSPHORUS: 4.3 mg/dL (ref 2.5–4.6)
PHOSPHORUS: 4.4 mg/dL (ref 2.5–4.6)

## 2014-12-03 LAB — GLUCOSE, CAPILLARY
GLUCOSE-CAPILLARY: 137 mg/dL — AB (ref 65–99)
Glucose-Capillary: 140 mg/dL — ABNORMAL HIGH (ref 65–99)
Glucose-Capillary: 147 mg/dL — ABNORMAL HIGH (ref 65–99)
Glucose-Capillary: 166 mg/dL — ABNORMAL HIGH (ref 65–99)
Glucose-Capillary: 168 mg/dL — ABNORMAL HIGH (ref 65–99)
Glucose-Capillary: 169 mg/dL — ABNORMAL HIGH (ref 65–99)

## 2014-12-03 LAB — ALBUMIN: ALBUMIN: 2.1 g/dL — AB (ref 3.5–5.0)

## 2014-12-03 LAB — POTASSIUM: Potassium: 3.2 mmol/L — ABNORMAL LOW (ref 3.5–5.1)

## 2014-12-03 MED ORDER — MORPHINE SULFATE 2 MG/ML IJ SOLN
2.0000 mg | INTRAMUSCULAR | Status: DC | PRN
  Administered 2014-12-03 – 2014-12-04 (×2): 2 mg via INTRAVENOUS
  Administered 2014-12-04 – 2014-12-07 (×4): 4 mg via INTRAVENOUS
  Administered 2014-12-10: 2 mg via INTRAVENOUS
  Administered 2014-12-10: 4 mg via INTRAVENOUS
  Filled 2014-12-03 (×2): qty 2
  Filled 2014-12-03: qty 1
  Filled 2014-12-03: qty 2
  Filled 2014-12-03 (×2): qty 1
  Filled 2014-12-03 (×3): qty 2

## 2014-12-03 MED ORDER — DEXTROSE 5 % IV SOLN: INTRAVENOUS | Status: AC

## 2014-12-03 MED ORDER — DIAZEPAM 5 MG/ML IJ SOLN
5.0000 mg | Freq: Four times a day (QID) | INTRAMUSCULAR | Status: DC
  Administered 2014-12-03 – 2014-12-05 (×8): 5 mg via INTRAVENOUS
  Filled 2014-12-03 (×9): qty 2

## 2014-12-03 MED ORDER — MIDAZOLAM HCL 2 MG/2ML IJ SOLN
INTRAMUSCULAR | Status: AC
  Filled 2014-12-03: qty 4

## 2014-12-03 MED ORDER — FENTANYL CITRATE (PF) 100 MCG/2ML IJ SOLN
INTRAMUSCULAR | Status: AC
  Filled 2014-12-03: qty 4

## 2014-12-03 MED ORDER — DEXTROSE 5 % IV SOLN: INTRAVENOUS | Status: DC

## 2014-12-03 MED ORDER — DEXTROSE 5 % IV SOLN
INTRAVENOUS | Status: DC
  Administered 2014-12-03: 06:00:00 via INTRAVENOUS

## 2014-12-03 MED ORDER — POTASSIUM CHLORIDE 10 MEQ/100ML IV SOLN
10.0000 meq | INTRAVENOUS | Status: AC
  Administered 2014-12-03 (×4): 10 meq via INTRAVENOUS
  Filled 2014-12-03 (×4): qty 100

## 2014-12-03 MED ORDER — POTASSIUM CHLORIDE 2 MEQ/ML IV SOLN
Freq: Once | INTRAVENOUS | Status: AC
  Administered 2014-12-03: 07:00:00 via INTRAVENOUS
  Filled 2014-12-03: qty 20

## 2014-12-03 MED ORDER — THIAMINE HCL 100 MG/ML IJ SOLN
100.0000 mg | Freq: Every day | INTRAMUSCULAR | Status: DC
  Administered 2014-12-04 – 2014-12-07 (×4): 100 mg via INTRAVENOUS
  Administered 2014-12-08: 200 mg via INTRAVENOUS
  Administered 2014-12-09 – 2014-12-26 (×18): 100 mg via INTRAVENOUS
  Filled 2014-12-03 (×23): qty 2

## 2014-12-03 NOTE — Consult Note (Signed)
Reason for Consult: confusion Referring Physician: Dr. Jiles Garter is an 69 y.o. male.  HPI: seen at request of Dr. Verdell Carmine for confusion;  69 yo RHD M presents to [facility] secondary to confusion after receiving chemotherapy.  There are no focal deficits noted but pt is very agitated.  Pt was placed on Precedex gtt with no avail.  This has happened to patient previously and resolved after a few days.  Pt has a decent baseline per notes as wife is not able to give much history about his ADLs otherwise.  Past Medical History  Diagnosis Date  . Arthritis   . Hypertension   . RA (rheumatoid arthritis)   . Anemia   . Cancer     lymphoma  . History of nuclear stress test     a. 12/2013: low risk, no sig ischemia, no EKG changes, no artifact, EF 63%  . Chronic kidney disease     Past Surgical History  Procedure Laterality Date  . Back surgery    . Fracture surgery      ankle  . Portacath placement    . Cystoscopy w/ ureteral stent placement Left 10/24/2014    Procedure: CYSTOSCOPY WITH RETROGRADE PYELOGRAM/URETERAL STENT PLACEMENT;  Surgeon: Irine Seal, MD;  Location: [facility] ORS;  Service: Urology;  Laterality: Left;    Family History  Problem Relation Age of Onset  . Cancer Mother     breast  . Cancer Father     bone cancer    Social History:  reports that he quit smoking about 6 weeks ago. His smoking use included Cigarettes. He has a 30 pack-year smoking history. He does not have any smokeless tobacco history on file. He reports that he does not drink alcohol or use illicit drugs.  Allergies: No Known Allergies  Medications: seen and reviewed by me  Results for orders placed or performed during the hospital encounter of 11/29/14 (from the past 48 hour(s))  Glucose, capillary     Status: Abnormal   Collection Time: 12/02/14  2:47 AM  Result Value Ref Range   Glucose-Capillary 149 (H) 65 - 99 mg/dL  CBC     Status: Abnormal   Collection Time: 12/02/14  3:53 AM   Result Value Ref Range   WBC 6.3 3.8 - 10.6 K/uL   RBC 2.76 (L) 4.40 - 5.90 MIL/uL   Hemoglobin 7.7 (L) 13.0 - 18.0 g/dL   HCT 23.8 (L) 40.0 - 52.0 %   MCV 86.2 80.0 - 100.0 fL   MCH 28.0 26.0 - 34.0 pg   MCHC 32.5 32.0 - 36.0 g/dL   RDW 19.0 (H) 11.5 - 14.5 %   Platelets 123 (L) 150 - 440 K/uL  Basic metabolic panel     Status: Abnormal   Collection Time: 12/02/14  3:53 AM  Result Value Ref Range   Sodium 154 (H) 135 - 145 mmol/L   Potassium 3.1 (L) 3.5 - 5.1 mmol/L   Chloride 126 (H) 101 - 111 mmol/L   CO2 18 (L) 22 - 32 mmol/L   Glucose, Bld 161 (H) 65 - 99 mg/dL   BUN 56 (H) 6 - 20 mg/dL   Creatinine, Ser 2.18 (H) 0.61 - 1.24 mg/dL   Calcium 8.7 (L) 8.9 - 10.3 mg/dL   GFR calc non Af Amer 30 (L) >60 mL/min   GFR calc Af Amer 34 (L) >60 mL/min    Comment: (NOTE) The eGFR has been calculated using the CKD EPI equation. This  calculation has not been validated in all clinical situations. eGFR's persistently <60 mL/min signify possible Chronic Kidney Disease.    Anion gap 10 5 - 15  Glucose, capillary     Status: Abnormal   Collection Time: 12/02/14 11:56 AM  Result Value Ref Range   Glucose-Capillary 156 (H) 65 - 99 mg/dL  Glucose, capillary     Status: Abnormal   Collection Time: 12/02/14  6:43 PM  Result Value Ref Range   Glucose-Capillary 147 (H) 65 - 99 mg/dL  Glucose, capillary     Status: Abnormal   Collection Time: 12/03/14 12:13 AM  Result Value Ref Range   Glucose-Capillary 137 (H) 65 - 99 mg/dL  CBC     Status: Abnormal   Collection Time: 12/03/14  4:37 AM  Result Value Ref Range   WBC 3.6 (L) 4.0 - 10.5 K/uL    Comment: TEST WILL BE CREDITED NOTIFIED BEN CASSIDY @ 0617 6.21.16 MPG    RBC 2.41 (L) 4.22 - 5.81 MIL/uL    Comment: TEST WILL BE CREDITED   Hemoglobin 6.8 (LL) 13.0 - 17.0 g/dL    Comment: TEST WILL BE CREDITED   HCT 20.8 (L) 39.0 - 52.0 %    Comment: TEST WILL BE CREDITED   MCV 86.1 78.0 - 100.0 fL    Comment: TEST WILL BE CREDITED   MCH  28.3 26.0 - 34.0 pg    Comment: TEST WILL BE CREDITED   MCHC 32.9 30.0 - 36.0 g/dL    Comment: TEST WILL BE CREDITED   RDW 19.0 (H) 11.5 - 15.5 %    Comment: TEST WILL BE CREDITED  Basic metabolic panel     Status: Abnormal   Collection Time: 12/03/14  4:37 AM  Result Value Ref Range   Sodium 160 (H) 135 - 145 mmol/L   Potassium 2.9 (LL) 3.5 - 5.1 mmol/L    Comment: CRITICAL RESULT CALLED TO, READ BACK BY AND VERIFIED WITH BEN CASSIDY @ 0521 6.21.16 MPG    Chloride >130 (HH) 101 - 111 mmol/L    Comment: CRITICAL RESULT CALLED TO, READ BACK BY AND VERIFIED WITH BEN CASSIDY @ 0521 6.21.16 MPG    CO2 18 (L) 22 - 32 mmol/L   Glucose, Bld 191 (H) 65 - 99 mg/dL   BUN 72 (H) 6 - 20 mg/dL   Creatinine, Ser 2.61 (H) 0.61 - 1.24 mg/dL   Calcium 8.3 (L) 8.9 - 10.3 mg/dL   GFR calc non Af Amer 24 (L) >60 mL/min   GFR calc Af Amer 28 (L) >60 mL/min    Comment: (NOTE) The eGFR has been calculated using the CKD EPI equation. This calculation has not been validated in all clinical situations. eGFR's persistently <60 mL/min signify possible Chronic Kidney Disease.    Anion gap SEE COMMENTS 5 - 15    Comment: UNABLE TO CALCULATE  Magnesium     Status: Abnormal   Collection Time: 12/03/14  4:37 AM  Result Value Ref Range   Magnesium 2.6 (H) 1.7 - 2.4 mg/dL  Phosphorus     Status: None   Collection Time: 12/03/14  4:37 AM  Result Value Ref Range   Phosphorus 4.3 2.5 - 4.6 mg/dL  Glucose, capillary     Status: Abnormal   Collection Time: 12/03/14  5:28 AM  Result Value Ref Range   Glucose-Capillary 168 (H) 65 - 99 mg/dL  Uric acid     Status: None   Collection Time: 12/03/14  6:33 AM  Result Value Ref Range   Uric Acid, Serum 7.3 4.4 - 7.6 mg/dL  Albumin     Status: Abnormal   Collection Time: 12/03/14  6:33 AM  Result Value Ref Range   Albumin 2.1 (L) 3.5 - 5.0 g/dL  Basic metabolic panel     Status: Abnormal   Collection Time: 12/03/14  6:33 AM  Result Value Ref Range   Sodium  165 (HH) 135 - 145 mmol/L    Comment: CRITICAL RESULT CALLED TO, READ BACK BY AND VERIFIED WITH: BRITTANY RUDD @ 0716 12/03/14 BY DAS    Potassium 3.2 (L) 3.5 - 5.1 mmol/L   Chloride >130 (HH) 101 - 111 mmol/L    Comment: CRITICAL RESULT CALLED TO, READ BACK BY AND VERIFIED WITH   CO2 17 (L) 22 - 32 mmol/L   Glucose, Bld 175 (H) 65 - 99 mg/dL   BUN 69 (H) 6 - 20 mg/dL   Creatinine, Ser 2.58 (H) 0.61 - 1.24 mg/dL   Calcium 8.9 8.9 - 10.3 mg/dL   GFR calc non Af Amer 24 (L) >60 mL/min   GFR calc Af Amer 28 (L) >60 mL/min    Comment: (NOTE) The eGFR has been calculated using the CKD EPI equation. This calculation has not been validated in all clinical situations. eGFR's persistently <60 mL/min signify possible Chronic Kidney Disease.    Anion gap NOT CALCULATED 5 - 15  CBC     Status: Abnormal   Collection Time: 12/03/14  6:33 AM  Result Value Ref Range   WBC 3.7 (L) 3.8 - 10.6 K/uL   RBC 2.55 (L) 4.40 - 5.90 MIL/uL   Hemoglobin 7.3 (L) 13.0 - 18.0 g/dL   HCT 22.4 (L) 40.0 - 52.0 %   MCV 87.8 80.0 - 100.0 fL   MCH 28.4 26.0 - 34.0 pg   MCHC 32.4 32.0 - 36.0 g/dL   RDW 19.5 (H) 11.5 - 14.5 %   Platelets 57 (L) 150 - 440 K/uL    Comment: PLATELET COUNT CONFIRMED BY SMEAR  Magnesium     Status: Abnormal   Collection Time: 12/03/14  6:33 AM  Result Value Ref Range   Magnesium 2.5 (H) 1.7 - 2.4 mg/dL  Phosphorus     Status: None   Collection Time: 12/03/14  6:33 AM  Result Value Ref Range   Phosphorus 4.4 2.5 - 4.6 mg/dL  Blood gas, arterial     Status: Abnormal (Preliminary result)   Collection Time: 12/03/14 11:36 AM  Result Value Ref Range   FIO2 21.00 %   Delivery systems PENDING    Inspiratory PAP PENDING    Expiratory PAP PENDING    pH, Arterial 7.43 7.350 - 7.450   pCO2 arterial 25 (L) 32.0 - 48.0 mmHg   pO2, Arterial 77 (L) 83.0 - 108.0 mmHg   Bicarbonate 16.6 (L) 21.0 - 28.0 mEq/L   Acid-base deficit 6.0 (H) 0.0 - 2.0 mmol/L   O2 Saturation 95.6 %   Patient  temperature 37.0    Oxygen index PENDING    Collection site RIGHT BRACHIAL    Sample type ARTERIAL DRAW    Allens test (pass/fail) POSITIVE (A) PASS  Glucose, capillary     Status: Abnormal   Collection Time: 12/03/14 12:13 PM  Result Value Ref Range   Glucose-Capillary 166 (H) 65 - 99 mg/dL   Comment 1 Notify RN   Potassium     Status: Abnormal   Collection Time: 12/03/14 12:26 PM  Result Value Ref  Range   Potassium 3.2 (L) 3.5 - 5.1 mmol/L  Glucose, capillary     Status: Abnormal   Collection Time: 12/03/14  5:27 PM  Result Value Ref Range   Glucose-Capillary 140 (H) 65 - 99 mg/dL    Mr Brain Wo Contrast  12/02/2014   CLINICAL DATA:  69 year old male with altered mental status, recent sepsis and urinary tract infection. Undergoing chemotherapy for mantle cell lymphoma. Initial encounter.  EXAM: MRI HEAD WITHOUT CONTRAST  TECHNIQUE: Multiplanar, multiecho pulse sequences of the brain and surrounding structures were obtained without intravenous contrast.  COMPARISON:  Head CTs without contrast 11/29/2014 and earlier.  FINDINGS: Study is intermittently degraded by motion artifact despite repeated imaging attempts.  Cerebral volume is within normal limits for age. No restricted diffusion to suggest acute infarction. No midline shift, mass effect, evidence of mass lesion, ventriculomegaly, extra-axial collection or acute intracranial hemorrhage. Cervicomedullary junction and pituitary are within normal limits. Major intracranial vascular flow voids are grossly preserved at the skullbase.  Minimal to mild for age nonspecific cerebral white matter signal changes. 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 No other encephalomalacia identified.  Mild left mastoid effusion has been present since a 2014 neck CT comparison. Right mastoids are clear. Paranasal sinuses are clear. Negative orbit and scalp soft tissues.  IMPRESSION: 1. No acute infarct or acute intracranial  abnormality evident on this noncontrast exam. Early metastatic/lymphoma involvement of the brain cannot be excluded in the absence of intravenous contrast. 2. 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 3. Mild chronic left mastoid effusion, most often postinflammatory and significance doubtful.   Electronically Signed   By: Genevie Ann M.D.   On: 12/02/2014 16:45    Review of Systems  Unable to perform ROS: patient unresponsive   Blood pressure 125/63, pulse 75, temperature 98.6 F (37 C), temperature source Axillary, resp. rate 32, height 6' (1.829 m), weight 73.483 kg (162 lb), SpO2 100 %. Physical Exam  Constitutional: He appears well-developed and well-nourished. He appears lethargic.  HENT:  Head: Normocephalic and atraumatic.  Mouth/Throat: Oropharynx is clear and moist.  Eyes: EOM are normal. Pupils are equal, round, and reactive to light.  Neck: Normal range of motion. Neck supple. No JVD present.  Cardiovascular: Normal rate, regular rhythm and normal heart sounds.   No murmur heard. Respiratory: Effort normal and breath sounds normal. No respiratory distress. He has no wheezes.  GI: Soft. Bowel sounds are normal.  Lymphadenopathy:    He has no cervical adenopathy.  Neurological: He has normal strength and normal reflexes. He appears lethargic. He displays no tremor. No cranial nerve deficit or sensory deficit. He exhibits normal muscle tone. GCS eye subscore is 2. GCS verbal subscore is 1. GCS motor subscore is 5.   MRI personally reviewed by me and shows minimal old white matter changes  Labs personally reviewed by me  Assessment/Plan: 1.  Encephalopathy-  This is multifactorial from worsening renal function, hypernatremia and infection.  Highly doubt CNS infection as cause.  EEG confirms this conclusion.  R/o other contributors.  Pt could also have a paraneoplastic syndrome which could worsen this as well. -  Check LFTs, ammonia, TSH, B12/folate, ESR and CRP -  Start Geodon  84m IM qHS until PO is established -  Start thiaimine 3094mIV daily -  Need to correct electrolyte abnormalities -  Continue to treat infections -  NG tube to start tonight for meds and nutrition -  Ok to continue Precedex  for now -  No need to LP unless pt continues to spike fevers -  Lights on during day and stimulation during day then lights off at night -  Will follow  Jina Olenick 12/03/2014, 5:49 PM

## 2014-12-03 NOTE — Progress Notes (Signed)
[place] at [facility]                                                                                                                                                                                            Patient Demographics   Caleb Francis, is a 69 y.o. male, DOB - 1946/01/20, ONG:[id number]  Admit date - 11/29/2014   Admitting Physician Aldean Jewett, MD  Outpatient Primary MD for the patient is No PCP Per Patient   LOS - 4  Subjective: Unable to wean patient off Precedex he has been started on Valium to help with his agitation. He also had an MRI of the brain which did show an old stroke but nothing new. Wife is at bedside   Review of Systems:   Unable to provide due to patient being Sedated  Vitals:   Filed Vitals:   12/03/14 0800 12/03/14 0900 12/03/14 1015 12/03/14 1100  BP: 131/76 124/79 122/69 110/63  Pulse: 75 80 73 72  Temp:  97.8 F (36.6 C)    TempSrc:  Axillary    Resp: 26 31 17 28   Height:      Weight:      SpO2: 96% 99% 96% 100%    Wt Readings from Last 3 Encounters:  11/29/14 73.483 kg (162 lb)  11/27/14 68.2 kg (150 lb 5.7 oz)  11/25/14 70.761 kg (156 lb)     Intake/Output Summary (Last 24 hours) at 12/03/14 1156 Last data filed at 12/03/14 1107  Gross per 24 hour  Intake 3691.7 ml  Output      0 ml  Net 3691.7 ml    Physical Exam:   GENERAL: Critically ill-appearing male confused HEAD, EYES, EARS, NOSE AND THROAT: Atraumatic, normocephalic. Extraocular muscles are intact. Pupils equal and reactive to light. Sclerae anicteric. No conjunctival injection. No oro-pharyngeal erythema.  NECK: Supple. There is no jugular venous distention. No bruits, no lymphadenopathy, no thyromegaly.  HEART:  tachycardic. No murmurs, no rubs, no clicks.  LUNGS: Clear to auscultation bilaterally. No rales or rhonchi. No wheezes.  ABDOMEN: Soft, flat, nontender, nondistended. Has good bowel sounds. No  hepatosplenomegaly appreciated.  EXTREMITIES: No evidence of any cyanosis, clubbing, or peripheral edema.  +2 pedal and radial pulses bilaterally.  NEUROLOGIC: Sedated SKIN: Moist and warm with no rashes appreciated.  Psych:  Sedated LN: No inguinal LN enlargement    Antibiotics   Anti-infectives    Start     Dose/Rate Route Frequency Ordered Stop   12/01/14 0945  piperacillin-tazobactam (ZOSYN) IVPB 3.375 g     3.375 g 12.5 mL/hr over 240 Minutes  Intravenous 3 times per day 12/01/14 0936     11/30/14 1000  cefTRIAXone (ROCEPHIN) 2 g in dextrose 5 % 50 mL IVPB - Premix    Comments:  Entered per MD progress note   2 g 100 mL/hr over 30 Minutes Intravenous Every 24 hours 11/29/14 2157     11/29/14 1415  cefTRIAXone (ROCEPHIN) 1 g in dextrose 5 % 50 mL IVPB - Premix     1 g 100 mL/hr over 30 Minutes Intravenous  Once 11/29/14 1406 11/29/14 1452   11/29/14 1414  cefTRIAXone (ROCEPHIN) 20 MG/ML IVPB 50 mL    Comments:  MONAR, NELLIE: cabinet override      11/29/14 1414 11/29/14 1424      Medications   Scheduled Meds: . antiseptic oral rinse  7 mL Mouth Rinse BID  . aspirin  324 mg Oral Once  . cefTRIAXone (ROCEPHIN)  IV  2 g Intravenous Q24H  . diazepam  5 mg Intravenous Q6H  . filgrastim  480 mcg Subcutaneous Once  . piperacillin-tazobactam (ZOSYN)  IV  3.375 g Intravenous 3 times per day  . sodium chloride  3 mL Intravenous Q12H   Continuous Infusions: . dexmedetomidine 0.8 mcg/kg/hr (12/03/14 1107)  . dextrose 100 mL/hr at 12/03/14 0611  . dextrose     PRN Meds:.acetaminophen **OR** acetaminophen, haloperidol lactate, LORazepam, magnesium hydroxide, morphine injection   Data Review:   Micro Results Recent Results (from the past 240 hour(s))  Culture, blood (single)     Status: None (Preliminary result)   Collection Time: 11/29/14 11:09 AM  Result Value Ref Range Status   Specimen Description BLOOD  Final   Special Requests NONE  Final   Culture  Setup Time    Final    GRAM NEGATIVE RODS AEROBIC BOTTLE ONLY CRITICAL RESULT CALLED TO, READ BACK BY AND VERIFIED WITH: SANDRA VORBA ON 11/30/14 AT 0825 BY JEF    Culture KLEBSIELLA OXYTOCA AEROBIC BOTTLE ONLY   Final   Report Status PENDING  Incomplete   Organism ID, Bacteria KLEBSIELLA OXYTOCA  Final      Susceptibility   Klebsiella oxytoca - MIC*    AMPICILLIN >=32 RESISTANT Resistant     CEFTAZIDIME <=1 SENSITIVE Sensitive     CEFAZOLIN >=64 RESISTANT Resistant     CEFTRIAXONE <=1 SENSITIVE Sensitive     CIPROFLOXACIN <=0.25 SENSITIVE Sensitive     GENTAMICIN <=1 SENSITIVE Sensitive     IMIPENEM <=0.25 SENSITIVE Sensitive     TRIMETH/SULFA <=20 SENSITIVE Sensitive     CEFOXITIN <=4 SENSITIVE Sensitive     * KLEBSIELLA OXYTOCA  Urine culture     Status: None   Collection Time: 11/29/14 11:22 AM  Result Value Ref Range Status   Specimen Description URINE, CLEAN CATCH  Final   Special Requests NONE  Final   Culture >=100,000 COLONIES/mL KLEBSIELLA OXYTOCA  Final   Report Status 12/01/2014 FINAL  Final   Organism ID, Bacteria KLEBSIELLA OXYTOCA  Final      Susceptibility   Klebsiella oxytoca - MIC*    AMPICILLIN >=32 RESISTANT Resistant     CEFAZOLIN >=64 RESISTANT Resistant     CEFTRIAXONE 4 SENSITIVE Sensitive     CIPROFLOXACIN <=0.25 SENSITIVE Sensitive     GENTAMICIN <=1 SENSITIVE Sensitive     IMIPENEM <=0.25 SENSITIVE Sensitive     NITROFURANTOIN <=16 SENSITIVE Sensitive     TRIMETH/SULFA <=20 SENSITIVE Sensitive     CEFOXITIN <=4 SENSITIVE Sensitive     * >=100,000 COLONIES/mL KLEBSIELLA OXYTOCA  MRSA PCR Screening     Status: None   Collection Time: 11/29/14 12:13 PM  Result Value Ref Range Status   MRSA by PCR NEGATIVE NEGATIVE Final    Comment:        The GeneXpert MRSA Assay (FDA approved for NASAL specimens only), is one component of a comprehensive MRSA colonization surveillance program. It is not intended to diagnose MRSA infection nor to guide or monitor  treatment for MRSA infections.   Culture, blood (routine x 2)     Status: None (Preliminary result)   Collection Time: 11/29/14  1:19 PM  Result Value Ref Range Status   Specimen Description BLOOD  Final   Special Requests Immunocompromised  Final   Culture NO GROWTH 4 DAYS  Final   Report Status PENDING  Incomplete  Urine culture     Status: None   Collection Time: 11/30/14 10:12 AM  Result Value Ref Range Status   Specimen Description URINE, CATHETERIZED  Final   Special Requests Immunocompromised  Final   Culture NO GROWTH 2 DAYS  Final   Report Status 12/02/2014 FINAL  Final    Radiology Reports Ct Head Wo Contrast  11/29/2014   CLINICAL DATA:  Concern for chemotherapy adverse reaction. Patient transfer from cancer center. Altered mental status.  EXAM: CT HEAD WITHOUT CONTRAST  TECHNIQUE: Contiguous axial images were obtained from the base of the skull through the vertex without intravenous contrast.  COMPARISON:  Head CT 11/09/2014  FINDINGS: No acute intracranial hemorrhage. No focal mass lesion. No CT evidence of acute infarction. No midline shift or mass effect. No hydrocephalus. Basilar cisterns are patent.  There is mild generalized cortical atrophy and proportional ventricular dilatation. Mild periventricular subcortical white matter hypodensities.  Paranasal sinuses and  mastoid air cells are clear.  IMPRESSION: 1. No acute intracranial findings.  No change from prior. 2. Mild atrophy and white matter microvascular disease.   Electronically Signed   By: Suzy Bouchard M.D.   On: 11/29/2014 13:48   Ct Head Wo Contrast  11/09/2014   CLINICAL DATA:  Degenerating mental status, sepsis and history of progressive mantle cell lymphoma.  EXAM: CT HEAD WITHOUT CONTRAST  TECHNIQUE: Contiguous axial images were obtained from the base of the skull through the vertex without intravenous contrast.  COMPARISON:  05/21/2009  FINDINGS: No findings by unenhanced head CT to suggest brain  involvement by lymphoma. The brain demonstrates no evidence of hemorrhage, infarction, edema, mass effect, extra-axial fluid collection, hydrocephalus or mass lesion. The skull is unremarkable.  IMPRESSION: Unremarkable head CT.   Electronically Signed   By: Aletta Edouard M.D.   On: 11/09/2014 13:30   Mr Brain Wo Contrast  12/02/2014   CLINICAL DATA:  69 year old male with altered mental status, recent sepsis and urinary tract infection. Undergoing chemotherapy for mantle cell lymphoma. Initial encounter.  EXAM: MRI HEAD WITHOUT CONTRAST  TECHNIQUE: Multiplanar, multiecho pulse sequences of the brain and surrounding structures were obtained without intravenous contrast.  COMPARISON:  Head CTs without contrast 11/29/2014 and earlier.  FINDINGS: Study is intermittently degraded by motion artifact despite repeated imaging attempts.  Cerebral volume is within normal limits for age. No restricted diffusion to suggest acute infarction. No midline shift, mass effect, evidence of mass lesion, ventriculomegaly, extra-axial collection or acute intracranial hemorrhage. Cervicomedullary junction and pituitary are within normal limits. Major intracranial vascular flow voids are grossly preserved at the skullbase.  Minimal to mild for age nonspecific cerebral white matter signal changes. Small chronic infarct in  the right lateral cerebellum re- identified. No other encephalomalacia identified.  Mild left mastoid effusion has been present since a 2014 neck CT comparison. Right mastoids are clear. Paranasal sinuses are clear. Negative orbit and scalp soft tissues.  IMPRESSION: 1. No acute infarct or acute intracranial abnormality evident on this noncontrast exam. Early metastatic/lymphoma involvement of the brain cannot be excluded in the absence of intravenous contrast. 2. 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 3. Mild chronic left mastoid effusion, most often postinflammatory and significance doubtful.    Electronically Signed   By: Genevie Ann M.D.   On: 12/02/2014 16:45   Ct Abdomen Pelvis W Contrast  11/25/2014   CLINICAL DATA:  69 year old male with history of mantle cell lymphoma recently treated with chemotherapy.  EXAM: CT ABDOMEN AND PELVIS WITH CONTRAST  TECHNIQUE: Multidetector CT imaging of the abdomen and pelvis was performed using the standard protocol following bolus administration of intravenous contrast.  CONTRAST:  90mL OMNIPAQUE IOHEXOL 300 MG/ML  SOLN  COMPARISON:  CT of the abdomen and pelvis 11/01/2014.  FINDINGS: Lower chest: Retrocrural lymph nodes are again noted bilaterally, largest of which on the right measures 1.6 cm in short axis, similar to the prior examination. Small amount of pericardial fluid and/or thickening, unlikely to be of any hemodynamic significance at this time.  Hepatobiliary: Numerous tiny partially calcified gallstones are noted within the gallbladder. No findings to suggest acute cholecystitis at this time. No significant cystic or solid hepatic lesions. No intra or extrahepatic biliary ductal dilatation.  Pancreas: No pancreatic mass. No pancreatic ductal dilatation. No pancreatic or peripancreatic fluid or inflammatory changes.  Spleen: Spleen remains mildly enlarged, but is smaller than the prior examination, currently measuring up to 13.2 cm in length.  Adrenals/Urinary Tract: Thickening of the left adrenal gland is unchanged. Right adrenal gland is normal. Right kidney is normal in appearance. There is a left-sided double-J ureteral stent in position, with the proximal loop reformed in the interpolar region of the left renal collecting system, and the distal loop performed in the lumen of the urinary bladder. Left kidney is otherwise normal in appearance. No hydroureteronephrosis. Mild diffuse bladder wall thickening, likely in part related to underdistention of the bladder. No discrete bladder mass identified.  Stomach/Bowel: The appearance of the stomach is normal.  No pathologic dilatation of small bowel or colon. Normal appendix.  Vascular/Lymphatic: Extensive lymphadenopathy is again noted throughout the abdomen and pelvis. The bulkiest retroperitoneal node or nodal conglomerate is in the left para-aortic nodal station immediately inferior to the left renal hilum, and this has decreased in size compared to the prior study, currently measuring 6.4 x 5.3 cm (image 25 of series 2), as compared with 8.8 x 6.9 cm on prior exam 11/09/2014. Bulky pelvic adenopathy is again noted bilaterally, generally similar to the prior study, with index lymph node in the left external iliac nodal station (image 57 of series 2) only slightly smaller than the prior examination, currently measuring 3.6 x 5.0 cm (previously 4.2 x 5.1 cm). However, some lymph nodes are larger than the prior examination, including a right external iliac lymph node, which currently measures 4.9 x 3.5 cm (image 59 of series 2) as compared with 4.7 x 3.0 cm on the prior study. Centrally low-attenuation presumably necrotic lymph node in the left common femoral nodal station measuring 3.4 x 4.8 cm. Extensive atherosclerosis throughout the abdominal and pelvic vasculature, without evidence of aneurysm or dissection.  Reproductive: Coarse calcifications are noted in the prostate gland (  nonspecific). Seminal vesicles are unremarkable in appearance.  Other: No significant volume of ascites.  No pneumoperitoneum.  Musculoskeletal: Status post L4 laminectomy. There are no aggressive appearing lytic or blastic lesions noted in the visualized portions of the skeleton.  IMPRESSION: 1. Today's study generally demonstrates slight positive response to therapy, with generalized decrease of lymphadenopathy in the abdomen and pelvis. However, there several lymph nodes which are essentially unchanged, and some lymph nodes which are slightly increased compared to the prior examination, as discussed above. 2. Additional findings, as above,  similar to the prior examination.   Electronically Signed   By: Vinnie Langton M.D.   On: 11/25/2014 13:12   Ct Abdomen Pelvis W Contrast  11/09/2014   CLINICAL DATA:  Diffuse abdominal pain.  Chemotherapy yesterday.  EXAM: CT ABDOMEN AND PELVIS WITH CONTRAST  TECHNIQUE: Multidetector CT imaging of the abdomen and pelvis was performed using the standard protocol following bolus administration of intravenous contrast.  CONTRAST:  156mL OMNIPAQUE IOHEXOL 300 MG/ML  SOLN  COMPARISON:  10/23/2014  FINDINGS: BODY WALL: Partly visible left lower axillary lymphadenopathy, recently evaluated by chest CT 10/30/2014.  There is bulky bilateral inguinal lymphadenopathy which has increased from 10/23/2014, with left inguinal node on image 88 measuring 31 mm short axis, previously 23 mm  LOWER CHEST: Reticular opacities in the lower lungs, greater on the right, is favored atelectasis. No definitive pneumonia. Lower thoracic lymphadenopathy has increased, with a left retrocrural lymph node currently 15 mm on image 23, previously 10 mm.  ABDOMEN/PELVIS:  Liver: No focal abnormality.  Biliary: Cholelithiasis. The gallbladder is distended but there is no inflammatory wall thickening.  Pancreas: Unremarkable.  Spleen: Mild splenomegaly, 15 cm in craniocaudal span. This is increased by approximately 4 cm since previous.  Adrenals: Negative.  Kidneys and ureters: Moderate bilateral hydroureteronephrosis to the level of the pelvis. This is despite an internal ureteral stent on the left which is well positioned. Symmetric renal enhancement.  Bladder: Thickening of the bladder wall, preferentially to the left, stable from previous. This is indeterminate and will be assessed on followup imaging.  Reproductive: No pathologic findings.  Bowel: No obstruction. No inflammatory bowel wall thickening  Retroperitoneum: There is bulky and diffuse retroperitoneal lymphadenopathy. There has been significant and rapid increased from previous.  Index left periaortic lymph node on image 39 measures 88 x 69 mm (previously 59 x 35 mm) on image 39). Left pelvic sidewall lymph node that was previously measured is on image 71 and stable at 58 x 41 mm. There is new enlargement of upper abdominal retroperitoneal lymph nodes . Some cystic change, presumably treatment related, present in the largest nodes, especially in the left pelvis.  Vascular: Mass effect on the left iliac veins by lymphadenopathy, with progressive subcutaneous edema in the left upper thigh.  OSSEOUS: No acute abnormalities.  IMPRESSION: 1. Rapid progression of lymphoma with marked enlargement of lymph nodes compared to 10/23/2014. 2. Bilateral hydroureteronephrosis, new on the right and unchanged on the left (despite a well-positioned left internal ureteral stent). 3. Progressive edema in the upper left thigh. Given severe compression of the left iliac vein by lymphadenopathy, consider Doppler to evaluate for DVT.   Electronically Signed   By: Monte Fantasia M.D.   On: 11/09/2014 02:13   US Renal  11/30/2014   CLINICAL DATA:  Lymphoma, urinary tract infection, previous placement of a left ureteral stent for obstructive lymphadenopathy and mass effect. Followup  EXAM: RENAL / URINARY TRACT ULTRASOUND COMPLETE  COMPARISON:  CT 11/25/2014  FINDINGS: Right Kidney:  Length: 12.4 cm. No mass or hydronephrosis. Mild cortical atrophy with increased cortical echogenicity.  Left Kidney:  Length: 14.3 cm. Improvement in now mild hydronephrosis. Mild left renal cortical thinning and increased echogenicity. Stent position not well visualized.  Bladder:  Concentric bladder wall thickening, 0.9 cm.  IMPRESSION: Improvement in now mild left hydronephrosis compared to the dissimilar prior exam.  Bilateral increased renal cortical echogenicity suggesting medical renal disease with mild degree of cortical atrophy.  Concentric bladder wall thickening which is nonspecific and may be seen with urinary tract  infection/ cystitis, infiltrative processes including lymphomatous involvement, or treatment effect.   Electronically Signed   By: Conchita Paris M.D.   On: 11/30/2014 11:24   [place] Extrem Low Left Comp  11/09/2014   CLINICAL DATA:  Left calf swelling and lower extremity pain. History of lymphoma.  EXAM: Left LOWER EXTREMITY VENOUS DOPPLER ULTRASOUND  TECHNIQUE: Gray-scale sonography with graded compression, as well as color Doppler and duplex ultrasound were performed to evaluate the lower extremity deep venous systems from the level of the common femoral vein and including the common femoral, femoral, profunda femoral, popliteal and calf veins including the posterior tibial, peroneal and gastrocnemius veins when visible. The superficial great saphenous vein was also interrogated. Spectral Doppler was utilized to evaluate flow at rest and with distal augmentation maneuvers in the common femoral, femoral and popliteal veins.  COMPARISON:  None.  FINDINGS: Contralateral Common Femoral Vein: Respiratory phasicity is normal and symmetric with the symptomatic side. No evidence of thrombus. Normal compressibility.  Common Femoral Vein: No evidence of thrombus. Normal compressibility, respiratory phasicity and response to augmentation.  Saphenofemoral Junction - Superficial Great Saphenous Vein: Due to patient pain, the patient requested that the examination be terminated therefore the superficial femoral vein through popliteal trifurcation could not be evaluated.  IMPRESSION: No evidence of deep venous thrombosis involving the upper common femoral vein. The patient requested that the examination be terminated and did not allow the sonographer to examine the lower extremity venous system from the level of the saphenous femoral junction through the popliteal trifurcation.   Electronically Signed   By: Conchita Paris M.D.   On: 11/09/2014 09:40   Dg Chest Portable 1 View  11/29/2014   CLINICAL DATA:  Altered mental  status. Possible reaction to chemotherapy.  EXAM: PORTABLE CHEST - 1 VIEW  COMPARISON:  11/15/2014  FINDINGS: Mild-to-moderate enlargement of the cardiopericardial silhouette with cephalization of blood flow. Faint Kerley B-lines noted at the right lung base, but no airspace edema.  Right IJ Port-A-Cath tip: SVC.  Tortuous thoracic aorta.  Deformity from prior surgical neck fracture the right humerus which appears healed.  IMPRESSION: Mild to moderate enlargement of the cardiopericardial silhouette with faint interstitial edema. No airspace opacity identified.   Electronically Signed   By: Van Clines M.D.   On: 11/29/2014 13:31   Dg Chest Port 1 View  11/15/2014   CLINICAL DATA:  Shortness of breath and cough.  Weakness.  EXAM: PORTABLE CHEST - 1 VIEW  COMPARISON:  11/08/2014  FINDINGS: Tip of the right chest port remains in the SVC. The heart is at the upper limits normal in size. There are increased perihilar markings, progressed from prior exam. Increased right basilar atelectasis, now with mild left basilar atelectasis. No definite pleural effusion or pneumothorax.  IMPRESSION: Increased perihilar markings concerning pulmonary edema. Increased right basilar atelectasis in development of mild left basilar atelectasis.   Electronically Signed  By: Jeb Levering M.D.   On: 11/15/2014 04:43   Dg Chest Port 1 View  11/08/2014   CLINICAL DATA:  Acute onset of sepsis.  Initial encounter.  EXAM: PORTABLE CHEST - 1 VIEW  COMPARISON:  Chest radiograph performed 11/12/2013, and CTA of the chest performed 10/30/2014  FINDINGS: The lungs are well-aerated. Mild vascular congestion is noted, with mild right basilar atelectasis. There is no evidence of pleural effusion or pneumothorax.  The cardiomediastinal silhouette is within normal limits. No acute osseous abnormalities are seen. A right-sided chest port is noted ending about the mid SVC.  IMPRESSION: Mild vascular congestion, with mild right basilar  atelectasis. Lungs otherwise clear.   Electronically Signed   By: Garald Balding M.D.   On: 11/08/2014 23:06     CBC  Recent Labs Lab 11/28/14 0913 11/29/14 1318 11/30/14 0500 11/30/14 1922 12/01/14 0436 12/02/14 0353 12/03/14 0437 12/03/14 0633  WBC 11.9* 9.5 8.5  --  7.4 6.3 3.6* 3.7*  HGB 7.1* 7.0* 6.6* 7.9* 8.2* 7.7* 6.8* 7.3*  HCT 21.7* 21.9* 20.2* 23.5* 24.8* 23.8* 20.8* 22.4*  PLT 284 288 276  --  191 123*  --  57*  MCV 84.8 86.5 84.9  --  85.7 86.2 86.1 87.8  MCH 27.5 27.5 27.7  --  28.3 28.0 28.3 28.4  MCHC 32.5 31.8* 32.7  --  33.1 32.5 32.9 32.4  RDW 19.7* 20.2* 19.8*  --  18.8* 19.0* 19.0* 19.5*  LYMPHSABS 0.8* 0.4*  --   --   --   --   --   --   MONOABS 0.6 0.2  --   --   --   --   --   --   EOSABS 0.0 0.0  --   --   --   --   --   --   BASOSABS 0.0 0.0  --   --   --   --   --   --     Chemistries   Recent Labs Lab 11/27/14 0827  11/29/14 1009 11/29/14 1318 11/30/14 0500 12/01/14 0436 12/02/14 0353 12/03/14 0437 12/03/14 0633  NA 137  < > 137 139 142 146* 154* 160* 165*  K 4.3  < > 4.8 4.7 4.5 3.6 3.1* 2.9* 3.2*  CL 105  < > 108 112* 117* 123* 126* >130* >130*  CO2 21*  < > 19* 19* 21* 19* 18* 18* 17*  GLUCOSE 111*  < > 139* 127* 68 173* 161* 191* 175*  BUN 12  < > 24* 25* 28* 43* 56* 72* 69*  CREATININE 1.26*  < > 1.24 1.21 1.29* 1.91* 2.18* 2.61* 2.58*  CALCIUM 8.3*  < > 8.4* 8.7* 8.6* 8.4* 8.7* 8.3* 8.9  MG  --   --   --   --   --   --   --  2.6* 2.5*  AST 27  --  44* 43*  --   --   --   --   --   ALT 25  --  45 42  --   --   --   --   --   ALKPHOS 247*  --  283* 268*  --   --   --   --   --   BILITOT 0.8  --  0.5 0.6  --   --   --   --   --   < > = values in this interval not displayed. ------------------------------------------------------------------------------------------------------------------ estimated creatinine clearance  is 28.9 mL/min (by C-G formula based on Cr of  2.58). ------------------------------------------------------------------------------------------------------------------ No results for input(s): HGBA1C in the last 72 hours. ------------------------------------------------------------------------------------------------------------------ No results for input(s): CHOL, HDL, LDLCALC, TRIG, CHOLHDL, LDLDIRECT in the last 72 hours. ------------------------------------------------------------------------------------------------------------------ No results for input(s): TSH, T4TOTAL, T3FREE, THYROIDAB in the last 72 hours.  Invalid input(s): FREET3 ------------------------------------------------------------------------------------------------------------------ No results for input(s): VITAMINB12, FOLATE, FERRITIN, TIBC, IRON, RETICCTPCT in the last 72 hours.  Coagulation profile  Recent Labs Lab 11/29/14 1318  INR 1.33    No results for input(s): DDIMER in the last 72 hours.  Cardiac Enzymes  Recent Labs Lab 11/29/14 1549 11/29/14 2121 11/30/14 0412  TROPONINI 0.06* 0.05* 0.07*   ------------------------------------------------------------------------------------------------------------------ Invalid input(s): POCBNP    Assessment & Plan   #1 sepsis: With 1 blood culture  And urine cx + klebseilla  sensitive to ceftriaxone  #2 This is likely due to metabolic encephalopathy due to sepsis and urinary tract infection. Continue supportive care , continue Precedex, neurology consult pending. 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 Patient's wife is not interested in doing a lumbar puncture.  #3 chest pain: No EKG changes to suggest ischemia.  Continue to monitor for any further cardiac symptoms  #4 urinary tract infection: This patient has a history of bilateral malignant hydronephrosis which is improved   #5 Mantle cell lymphoma: He will need to follow-up with oncology for continued treatment   #6. Anemia: Due to chemotherapy and  Mantle cell lymphoma,status post 2 units of packed RBCs continue to monitor  #7. Acute renal failure with hypernatremia: Change IV fluids to D5W, nephrology consult  Prognosis seems to be very poor palliative care consult appreciated patient is DO NOT RESUSCITATE     Code Status Orders        Start     Ordered   11/29/14 1731  Full code   Continuous     11/29/14 1730       DNR per prvious admission     Consults  oncology  DVT Prophylaxis   SCDs   Lab Results  Component Value Date   PLT 57* 12/03/2014     Time Spent in minutes critical care 62min    Dustin Flock M.D on 12/03/2014 at 11:56 AM  Between 7am to 6pm - Pager - [phone]  After 6pm go to [email or web address] - password [email or web address] [facility]   Office  [phone]

## 2014-12-03 NOTE — Progress Notes (Signed)
Nutrition Follow-up   INTERVENTION:   Coordination of Care: discussed nutritional poc during ICU rounds; if unable to advance diet in next few days, recommend initiation of nutrition support. Recommend EN over PN with attempted dobhoff placement if feasible. Continue to assess  NUTRITION DIAGNOSIS:  Inadequate oral intake related to inability to eat as evidenced by NPO status. Continues   GOAL:  Diet advancement as medically able    MONITOR:   (Energy Intake, Electrolyte and renal Profile, Digestive system, Anthropometrics, Anemia Profile)  ASSESSMENT:  Pt continues with AMS, confused/agitated at times, sedated at present  Diet Order: NPO since admission 6/17  Electrolyte and Renal Profile:  Recent Labs Lab 12/02/14 0353 12/03/14 0437 12/03/14 0633 12/03/14 1226  BUN 56* 72* 69*  --   CREATININE 2.18* 2.61* 2.58*  --   NA 154* 160* 165*  --   K 3.1* 2.9* 3.2* 3.2*  MG  --  2.6* 2.5*  --   PHOS  --  4.3 4.4  --    Glucose Profile:  Recent Labs  12/03/14 0013 12/03/14 0528 12/03/14 1213  GLUCAP 137* 168* 166*   Meds: precedex, D5 at 100 ml/hr, potassium chloride  Height:  Ht Readings from Last 1 Encounters:  11/29/14 6' (1.829 m)    Weight:  Wt Readings from Last 1 Encounters:  11/29/14 162 lb (73.483 kg)    Filed Weights   11/29/14 1222  Weight: 162 lb (73.483 kg)     BMI:  Body mass index is 21.97 kg/(m^2).  Estimated Nutritional Needs:  Kcal:  1994-2357kcals, BEE: 1511kcals, TEE: IF 1.1-1.3)(AF 1.2)  Protein:  74-88g protein (1.0-1.2g/kg)  Fluid:  1838-2225mL of fluid (25-66mL/kg)  Diet Order:  Diet NPO time specified  EDUCATION NEEDS:  No education needs identified at this time   Intake/Output Summary (Last 24 hours) at 12/03/14 1331 Last data filed at 12/03/14 1200  Gross per 24 hour  Intake 3805.7 ml  Output      0 ml  Net 3805.7 ml    HIGH Care Level  Kerman Passey MS, RD, LDN [phone] Pager

## 2014-12-03 NOTE — Care Management Note (Signed)
Case Management Note  Patient Details  Name: HAIDAN NHAN MRN: [id number] Date of Birth: Jul 25, 1945  Subjective/Objective:    Patient from [facility]. CSW following. Remains on Precedex gtt, intermittently agitated. Neuro consult pending.                Action/Plan:   Expected Discharge Date:                  Expected Discharge Plan:  Skilled Nursing Facility  In-House Referral:  Clinical Social Work  Discharge planning Services     Post Acute Care Choice:    Choice offered to:     DME Arranged:    DME Agency:     HH Arranged:    [place] Agency:     Status of Service:     Medicare Important Message Given:  Yes Date Medicare IM Given:  12/03/14 Medicare IM give by:  Orvan July Date Additional Medicare IM Given:    Additional Medicare Important Message give by:     If discussed at [place] of Stay Meetings, dates discussed:    Additional Comments:  Jolly Mango, RN 12/03/2014, 11:28 AM

## 2014-12-03 NOTE — Progress Notes (Signed)
MEDICATION RELATED CONSULT NOTE - FOLLOW UP   Pharmacy Consult for Electrolyte management  Indication: Electrolyte management  No Known Allergies  Patient Measurements: Height: 6' (182.9 cm) Weight: 162 lb (73.483 kg) IBW/kg (Calculated) : 77.6 Adjusted Body Weight:   Vital Signs: Temp: 97.5 F (36.4 C) (06/21 1200) Temp Source: Axillary (06/21 1200) BP: 131/72 mmHg (06/21 1300) Pulse Rate: 74 (06/21 1300) Intake/Output from previous day: 06/20 0701 - 06/21 0700 In: 3046 [I.V.:2446; IV Piggyback:600] Out: -  Intake/Output from this shift: Total I/O In: 759.7 [I.V.:334.7; IV Piggyback:425] Out: -   Labs:  Recent Labs  12/01/14 0436 12/02/14 0353 12/03/14 0437 12/03/14 0633  WBC 7.4 6.3 3.6* 3.7*  HGB 8.2* 7.7* 6.8* 7.3*  HCT 24.8* 23.8* 20.8* 22.4*  PLT 191 123*  --  57*  CREATININE 1.91* 2.18* 2.61* 2.58*  MG  --   --  2.6* 2.5*  PHOS  --   --  4.3 4.4  ALBUMIN  --   --   --  2.1*   Estimated Creatinine Clearance: 28.9 mL/min (by C-G formula based on Cr of 2.58).   Microbiology: Recent Results (from the past 720 hour(s))  Blood Culture (routine x 2)     Status: None   Collection Time: 11/08/14 10:21 PM  Result Value Ref Range Status   Specimen Description BLOOD  Final   Special Requests NONE  Final   Culture NO GROWTH 5 DAYS  Final   Report Status 11/13/2014 FINAL  Final  Blood Culture (routine x 2)     Status: None   Collection Time: 11/08/14 10:22 PM  Result Value Ref Range Status   Specimen Description BLOOD  Final   Special Requests NONE  Final   Culture NO GROWTH 5 DAYS  Final   Report Status 11/13/2014 FINAL  Final  Urine culture     Status: None   Collection Time: 11/09/14  2:30 AM  Result Value Ref Range Status   Specimen Description URINE, RANDOM  Final   Special Requests NONE  Final   Culture NO GROWTH 1 DAY  Final   Report Status 11/10/2014 FINAL  Final  Culture, blood (single)     Status: None (Preliminary result)   Collection Time:  11/29/14 11:09 AM  Result Value Ref Range Status   Specimen Description BLOOD  Final   Special Requests NONE  Final   Culture  Setup Time   Final    GRAM NEGATIVE RODS AEROBIC BOTTLE ONLY CRITICAL RESULT CALLED TO, READ BACK BY AND VERIFIED WITH: SANDRA VORBA ON 11/30/14 AT 0825 BY JEF    Culture KLEBSIELLA OXYTOCA AEROBIC BOTTLE ONLY   Final   Report Status PENDING  Incomplete   Organism ID, Bacteria KLEBSIELLA OXYTOCA  Final      Susceptibility   Klebsiella oxytoca - MIC*    AMPICILLIN >=32 RESISTANT Resistant     CEFTAZIDIME <=1 SENSITIVE Sensitive     CEFAZOLIN >=64 RESISTANT Resistant     CEFTRIAXONE <=1 SENSITIVE Sensitive     CIPROFLOXACIN <=0.25 SENSITIVE Sensitive     GENTAMICIN <=1 SENSITIVE Sensitive     IMIPENEM <=0.25 SENSITIVE Sensitive     TRIMETH/SULFA <=20 SENSITIVE Sensitive     CEFOXITIN <=4 SENSITIVE Sensitive     * KLEBSIELLA OXYTOCA  Urine culture     Status: None   Collection Time: 11/29/14 11:22 AM  Result Value Ref Range Status   Specimen Description URINE, CLEAN CATCH  Final   Special Requests NONE  Final  Culture >=100,000 COLONIES/mL KLEBSIELLA OXYTOCA  Final   Report Status 12/01/2014 FINAL  Final   Organism ID, Bacteria KLEBSIELLA OXYTOCA  Final      Susceptibility   Klebsiella oxytoca - MIC*    AMPICILLIN >=32 RESISTANT Resistant     CEFAZOLIN >=64 RESISTANT Resistant     CEFTRIAXONE 4 SENSITIVE Sensitive     CIPROFLOXACIN <=0.25 SENSITIVE Sensitive     GENTAMICIN <=1 SENSITIVE Sensitive     IMIPENEM <=0.25 SENSITIVE Sensitive     NITROFURANTOIN <=16 SENSITIVE Sensitive     TRIMETH/SULFA <=20 SENSITIVE Sensitive     CEFOXITIN <=4 SENSITIVE Sensitive     * >=100,000 COLONIES/mL KLEBSIELLA OXYTOCA  MRSA PCR Screening     Status: None   Collection Time: 11/29/14 12:13 PM  Result Value Ref Range Status   MRSA by PCR NEGATIVE NEGATIVE Final    Comment:        The GeneXpert MRSA Assay (FDA approved for NASAL specimens only), is one  component of a comprehensive MRSA colonization surveillance program. It is not intended to diagnose MRSA infection nor to guide or monitor treatment for MRSA infections.   Culture, blood (routine x 2)     Status: None (Preliminary result)   Collection Time: 11/29/14  1:19 PM  Result Value Ref Range Status   Specimen Description BLOOD  Final   Special Requests Immunocompromised  Final   Culture NO GROWTH 4 DAYS  Final   Report Status PENDING  Incomplete  Urine culture     Status: None   Collection Time: 11/30/14 10:12 AM  Result Value Ref Range Status   Specimen Description URINE, CATHETERIZED  Final   Special Requests Immunocompromised  Final   Culture NO GROWTH 2 DAYS  Final   Report Status 12/02/2014 FINAL  Final    Medications:  Scheduled:  . antiseptic oral rinse  7 mL Mouth Rinse BID  . aspirin  324 mg Oral Once  . cefTRIAXone (ROCEPHIN)  IV  2 g Intravenous Q24H  . diazepam  5 mg Intravenous Q6H  . filgrastim  480 mcg Subcutaneous Once  . potassium chloride  10 mEq Intravenous Q1 Hr x 4  . sodium chloride  3 mL Intravenous Q12H    Assessment: K low after repeat draw. Level resulted @ 3.2.   Goal of Therapy:  Normalization of K  Plan:  Will give KCl 10 mEq IVPB x 4.Will check K with electrolyte labs in am.   Judit Awad D 12/03/2014,2:08 PM

## 2014-12-03 NOTE — Consult Note (Signed)
Palliative Medicine Inpatient Consult Note   Name: Caleb Francis Date: 12/03/2014 MRN: [id number]  DOB: 1945/08/13  Referring Physician: Dustin Flock, MD  Palliative Care consult requested for this 69 y.o. male for goals of medical therapy in patient with mantle cell lymphoma, getting chemotherapy, admitted with altered mental status  Caleb Francis is a 69 yo man with PMH of mantle cell lymphoma dx 02/2013 on salvage chemo, HTN, RA, 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 He was hospitalzed 5/27-11/19/14 with abd pain, sepsis, A/CKD. He was discharged to SNF and continued with chemotherapy. He was readmitted 11/29/14 with altered mental status. Urine and blood cx's + for Klebsiella. Mental status remains poor. MRI without contrast shows no acute change. EEG done this AM, results pending. At present, pt is lying in bed. Appears uncomfortable. Does not respond to verbal stimuli. Wife at bedside.   REVIEW OF SYSTEMS:  Patient is not able to provide ROS   SOCIAL HISTORY: Pt is married but does not live with his wife. He has a son and a daughter. He worked for [employer] until 2006 when he became disabled due to his RA. reports that he quit smoking about 6 weeks ago. His smoking use included Cigarettes. He has a 30 pack-year smoking history. He does not have any smokeless tobacco history on file. He reports that he does not drink alcohol or use illicit drugs.  CODE STATUS: DNR  PAST MEDICAL HISTORY: Past Medical History  Diagnosis Date  . Arthritis   . Hypertension   . RA (rheumatoid arthritis)   . Anemia   . Cancer     lymphoma  . History of nuclear stress test     a. 12/2013: low risk, no sig ischemia, no EKG changes, no artifact, EF 63%  . Chronic kidney disease     PAST SURGICAL HISTORY:  Past Surgical History  Procedure Laterality Date  . Back surgery    . Fracture surgery      ankle  . Portacath placement    . Cystoscopy w/ ureteral stent placement Left 10/24/2014     Procedure: CYSTOSCOPY WITH RETROGRADE PYELOGRAM/URETERAL STENT PLACEMENT;  Surgeon: Irine Seal, MD;  Location: [facility] ORS;  Service: Urology;  Laterality: Left;    ALLERGIES:  has No Known Allergies.  MEDICATIONS:  Current Facility-Administered Medications  Medication Dose Route Frequency Provider Last Rate Last Dose  . acetaminophen (TYLENOL) tablet 650 mg  650 mg Oral Q6H PRN Aldean Jewett, MD       Or  . acetaminophen (TYLENOL) suppository 650 mg  650 mg Rectal Q6H PRN Aldean Jewett, MD   650 mg at 12/02/14 1932  . antiseptic oral rinse (CPC / CETYLPYRIDINIUM CHLORIDE 0.05%) solution 7 mL  7 mL Mouth Rinse BID Aldean Jewett, MD   7 mL at 12/03/14 0933  . aspirin chewable tablet 324 mg  324 mg Oral Once Lavonia Drafts, MD   324 mg at 11/29/14 1605  . cefTRIAXone (ROCEPHIN) 2 g in dextrose 5 % 50 mL IVPB - Premix  2 g Intravenous Q24H Aldean Jewett, MD   2 g at 12/03/14 0932  . dexmedetomidine (PRECEDEX) 400 MCG/100ML (4 mcg/mL) infusion  0.4-1.2 mcg/kg/hr Intravenous Titrated Dustin Flock, MD 14.7 mL/hr at 12/03/14 0800 0.8 mcg/kg/hr at 12/03/14 0800  . dextrose 5 % solution   Intravenous Continuous Charlett Nose, RPH 100 mL/hr at 12/03/14 0865    . diazepam (VALIUM) injection 2.5 mg  2.5 mg Intravenous Q6H Dustin Flock, MD  2.5 mg at 12/03/14 0546  . fentaNYL (SUBLIMAZE) 100 MCG/2ML injection           . filgrastim (NEUPOGEN) injection 480 mcg  480 mcg Subcutaneous Once Aldean Jewett, MD   480 mcg at 11/30/14 0735  . haloperidol lactate (HALDOL) injection 2.5 mg  2.5 mg Intravenous Q4H PRN Henreitta Leber, MD   2.5 mg at 12/03/14 0659  . HYDROmorphone (DILAUDID) injection 0.5 mg  0.5 mg Intravenous Q3H PRN Aldean Jewett, MD   0.5 mg at 12/02/14 2032  . LORazepam (ATIVAN) injection 0.5-1 mg  0.5-1 mg Intravenous Q4H PRN Dustin Flock, MD   1 mg at 12/03/14 0932  . magnesium hydroxide (MILK OF MAGNESIA) suspension 30 mL  30 mL Oral Daily PRN Aldean Jewett, MD      . midazolam (VERSED) 2 MG/2ML injection           . piperacillin-tazobactam (ZOSYN) IVPB 3.375 g  3.375 g Intravenous 3 times per day Evlyn Kanner, NP   3.375 g at 12/03/14 0546  . potassium chloride 40 mEq in dextrose 5 % 500 mL injection   Intravenous Once Charlett Nose, RPH      . sodium chloride 0.9 % injection 3 mL  3 mL Intravenous Q12H Aldean Jewett, MD   3 mL at 11/29/14 2159   Facility-Administered Medications Ordered in Other Encounters  Medication Dose Route Frequency Provider Last Rate Last Dose  . 0.9 %  sodium chloride infusion   Intravenous Continuous Lequita Asal, MD 20 mL/hr at 11/06/14 1010 20 mL/hr at 11/06/14 1010  . 0.9 %  sodium chloride infusion   Intravenous Continuous Lequita Asal, MD   Stopped at 11/28/14 1500  . morphine 2 MG/ML injection 2 mg  2 mg Intravenous Q2H PRN Lequita Asal, MD   2 mg at 11/05/14 1422  . sodium chloride 0.9 % injection 10 mL  10 mL Intracatheter PRN Lequita Asal, MD   10 mL at 11/06/14 1011    Vital Signs: BP 124/79 mmHg  Pulse 80  Temp(Src) 97.8 F (36.6 C) (Axillary)  Resp 31  Ht 6' (1.829 m)  Wt 73.483 kg (162 lb)  BMI 21.97 kg/m2  SpO2 99% Filed Weights   11/29/14 1222  Weight: 73.483 kg (162 lb)    Estimated body mass index is 21.97 kg/(m^2) as calculated from the following:   Height as of this encounter: 6' (1.829 m).   Weight as of this encounter: 73.483 kg (162 lb).   PHYSICAL EXAM:  [place]: Critically ill appearing HEENT: OP clear Neck: Trachea midline  Cardiovascular: regular rate and rhythm Pulmonary/Chest: fair air movemnt ant fields, no audible wheeze Abdominal: Soft. Hypoactive bowel sounds GU: Foley present, clear yellow urine Extremities: no edema BLE's Neurological: moves extremities, does not follow commands Skin: no rashes Psychiatric: Unable to assess    LABS: CBC:  Recent Labs Lab 12/02/14 0353 12/03/14 0437 12/03/14 0633  WBC 6.3 3.6*  3.7*  HGB 7.7* 6.8* 7.3*  HCT 23.8* 20.8* 22.4*  PLT 123*  --  57*   Comprehensive Metabolic Panel:  Recent Labs Lab 11/29/14 1009 11/29/14 1318  12/03/14 0437 12/03/14 0633  NA 137 139  < > 160* 165*  K 4.8 4.7  < > 2.9* 3.2*  CL 108 112*  < > >130* >130*  CO2 19* 19*  < > 18* 17*  GLUCOSE 139* 127*  < > 191* 175*  BUN 24* 25*  < >  72* 69*  CREATININE 1.24 1.21  < > 2.61* 2.58*  CALCIUM 8.4* 8.7*  < > 8.3* 8.9  MG  --   --   --  2.6* 2.5*  AST 44* 43*  --   --   --   ALT 45 42  --   --   --   ALKPHOS 283* 268*  --   --   --   BILITOT 0.5 0.6  --   --   --   < > = values in this interval not displayed.  IMPRESSION: Caleb Francis is a 69 yo man with PMH of mantle cell lymphoma dx 02/2013 on salvage chemo, HTN, RA, 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 He was hospitalzed 5/27-11/19/14 with abd pain, sepsis, A/CKD. He was discharged to SNF and continued with chemotherapy. He was readmitted 11/29/14 with altered mental status. Urine and blood cx's + for Klebsiella. Mental status remains poor. MRI without contrast shows no acute change. EEG done this AM, results pending.   Pt known to me from previous hospitalization. At that time, transfer to [place] was considered but pt recovered and went to SNF where he continued to improve. He has continued his chemotherapy with plan for evaluation for BM transplant.   I spoke with pt's wife at bedside. She is very emotional about pt's sudden decline as she says he was doing so well. She is in agreement with current tx an remains hopeful that pt will recover from this acute illness. Support offered.   PLAN: As above  REFERRALS TO BE ORDERED:  Chaplain   More than 50% of the visit was spent in counseling/coordination of care: YES  Time spent: 70 minutes

## 2014-12-03 NOTE — Progress Notes (Signed)
Resting on and off during shift. Patient continues to have bursts of agitation flailing arms and legs in bed and combative, not following commands. Seizure pads used for patient's own protection. NSR per cardiac monitor. Afebrile. VSS. Condom cath in use and patent at this time. Precedex drip in use for aggitaion along with PRN meds. Wife at bedside during most of the day and spoke with Dr. Posey Pronto and Dr. Ermalinda Memos. Sitter at bedside for safety at all times.  Caleb Francis

## 2014-12-03 NOTE — Progress Notes (Signed)
Condom catheter applied. Peri care performed prior to application, pubic hair clipped and skin protectant applied as well.  Caleb Francis B

## 2014-12-03 NOTE — Progress Notes (Signed)
Dr. Jannifer Franklin notified of low potassium and high chloride and sodium levels. Orders received. Dr. Macie Burows notified by EEG tech of patient being too agitated to do EEG. Dr. Macie Burows ordered 2.5 of haldol and increase precedex drip. Patient otherwise stable right chest portacath. VSS SR on monitor. Tachypnea when agitated. See charting for assessments.

## 2014-12-04 DIAGNOSIS — G9341 Metabolic encephalopathy: Secondary | ICD-10-CM | POA: Insufficient documentation

## 2014-12-04 DIAGNOSIS — R451 Restlessness and agitation: Secondary | ICD-10-CM

## 2014-12-04 LAB — BASIC METABOLIC PANEL
BUN: 66 mg/dL — ABNORMAL HIGH (ref 6–20)
CALCIUM: 9 mg/dL (ref 8.9–10.3)
CO2: 19 mmol/L — AB (ref 22–32)
Creatinine, Ser: 2.45 mg/dL — ABNORMAL HIGH (ref 0.61–1.24)
GFR calc Af Amer: 30 mL/min — ABNORMAL LOW (ref >60)
GFR, EST NON AFRICAN AMERICAN: 26 mL/min — AB (ref >60)
Glucose, Bld: 182 mg/dL — ABNORMAL HIGH (ref 65–99)
POTASSIUM: 2.8 mmol/L — AB (ref 3.5–5.1)
Sodium: 163 mmol/L (ref 135–145)

## 2014-12-04 LAB — CBC WITH DIFFERENTIAL/PLATELET
Basophils Absolute: 0 10*3/uL (ref 0–0.1)
Basophils Relative: 0 %
Eosinophils Absolute: 0.1 10*3/uL (ref 0–0.7)
Eosinophils Relative: 2 %
HCT: 22.2 % — ABNORMAL LOW (ref 40.0–52.0)
Hemoglobin: 7.2 g/dL — ABNORMAL LOW (ref 13.0–18.0)
Lymphocytes Relative: 35 %
Lymphs Abs: 1 10*3/uL (ref 1.0–3.6)
MCH: 28.2 pg (ref 26.0–34.0)
MCHC: 32.5 g/dL (ref 32.0–36.0)
MCV: 87 fL (ref 80.0–100.0)
Monocytes Absolute: 0.1 10*3/uL — ABNORMAL LOW (ref 0.2–1.0)
Monocytes Relative: 3 %
Neutro Abs: 1.6 10*3/uL (ref 1.4–6.5)
Neutrophils Relative %: 60 %
Platelets: 24 10*3/uL — CL (ref 150–440)
RBC: 2.55 MIL/uL — ABNORMAL LOW (ref 4.40–5.90)
RDW: 20 % — ABNORMAL HIGH (ref 11.5–14.5)
WBC: 2.7 10*3/uL — ABNORMAL LOW (ref 3.8–10.6)

## 2014-12-04 LAB — BLOOD GAS, ARTERIAL
ACID-BASE DEFICIT: 6 mmol/L — AB (ref 0.0–2.0)
Allens test (pass/fail): POSITIVE — AB
Bicarbonate: 16.6 mEq/L — ABNORMAL LOW (ref 21.0–28.0)
FIO2: 21 %
O2 SAT: 95.6 %
Patient temperature: 37
pCO2 arterial: 25 mmHg — ABNORMAL LOW (ref 32.0–48.0)
pH, Arterial: 7.43 (ref 7.350–7.450)
pO2, Arterial: 77 mmHg — ABNORMAL LOW (ref 83.0–108.0)

## 2014-12-04 LAB — TSH: TSH: 0.362 u[IU]/mL (ref 0.350–4.500)

## 2014-12-04 LAB — VITAMIN B12: Vitamin B-12: 1237 pg/mL — ABNORMAL HIGH (ref 180–914)

## 2014-12-04 LAB — CULTURE, BLOOD (ROUTINE X 2): CULTURE: NO GROWTH

## 2014-12-04 LAB — GLUCOSE, CAPILLARY
Glucose-Capillary: 167 mg/dL — ABNORMAL HIGH (ref 65–99)
Glucose-Capillary: 145 mg/dL — ABNORMAL HIGH (ref 65–99)
Glucose-Capillary: 162 mg/dL — ABNORMAL HIGH (ref 65–99)

## 2014-12-04 LAB — HEPATIC FUNCTION PANEL
ALT: 17 U/L (ref 17–63)
AST: 20 U/L (ref 15–41)
Albumin: 2 g/dL — ABNORMAL LOW (ref 3.5–5.0)
Alkaline Phosphatase: 141 U/L — ABNORMAL HIGH (ref 38–126)
BILIRUBIN DIRECT: 1.2 mg/dL — AB (ref 0.1–0.5)
BILIRUBIN INDIRECT: 0.9 mg/dL (ref 0.3–0.9)
Total Bilirubin: 2.1 mg/dL — ABNORMAL HIGH (ref 0.3–1.2)
Total Protein: 5.9 g/dL — ABNORMAL LOW (ref 6.5–8.1)

## 2014-12-04 LAB — SEDIMENTATION RATE: SED RATE: 127 mm/h — AB (ref 0–20)

## 2014-12-04 LAB — AMMONIA: Ammonia: 53 umol/L — ABNORMAL HIGH (ref 9–35)

## 2014-12-04 LAB — POTASSIUM: Potassium: 3.1 mmol/L — ABNORMAL LOW (ref 3.5–5.1)

## 2014-12-04 LAB — FOLATE: Folate: 22.2 ng/mL (ref >5.9)

## 2014-12-04 LAB — MAGNESIUM: Magnesium: 2.4 mg/dL (ref 1.7–2.4)

## 2014-12-04 LAB — PHOSPHORUS: PHOSPHORUS: 3.4 mg/dL (ref 2.5–4.6)

## 2014-12-04 MED ORDER — POTASSIUM CHLORIDE 10 MEQ/100ML IV SOLN
10.0000 meq | INTRAVENOUS | Status: AC
  Administered 2014-12-04 (×4): 10 meq via INTRAVENOUS
  Filled 2014-12-04 (×4): qty 100

## 2014-12-04 MED ORDER — POTASSIUM CHLORIDE 10 MEQ/100ML IV SOLN
10.0000 meq | INTRAVENOUS | Status: AC
  Administered 2014-12-04 – 2014-12-05 (×4): 10 meq via INTRAVENOUS
  Filled 2014-12-04 (×4): qty 100

## 2014-12-04 MED ORDER — FILGRASTIM 480 MCG/1.6ML IJ SOLN
480.0000 ug | Freq: Every day | INTRAMUSCULAR | Status: DC
  Administered 2014-12-04 – 2014-12-12 (×9): 480 ug via SUBCUTANEOUS
  Filled 2014-12-04 (×14): qty 1.6

## 2014-12-04 MED ORDER — POTASSIUM CHLORIDE 2 MEQ/ML IV SOLN
INTRAVENOUS | Status: DC
  Administered 2014-12-04 – 2014-12-05 (×4): via INTRAVENOUS
  Filled 2014-12-04 (×13): qty 1000

## 2014-12-04 NOTE — Progress Notes (Signed)
Attempted to place NG tube.  Pt was combative and resistant.  Pt was told prior to attempted insertion what was going to be done, pt appeared to understand and immediately began to resist.  During insertion attempt pt's HR elevated past 150's, pt became labored in his breathing.  Placement was unsuccessful.  Pt was allowed to calm down afterwards, mouth care was done, pt was compliant and appeared to be more communicative and followed my commands. No further agitation after stopping insertion.

## 2014-12-04 NOTE — Progress Notes (Signed)
Neurology Note  S:  Pt more alert per nursing.  Following some commands today.  Son is at bedside and feels like he is better.  ROS unobtainable secondary to mental status  O:  AFVSS NL weight, no acute distress Normocephalic, oropharynx clear CTA B, no wheezing RRR, no murmur No C/C/E  Alert and oriened x 2 not time, follows simple commands, mild dysarthria PERRLA, EOMI, face symmetric Moves all ext equally  A/P: 1.  Encephalopathy- improving, multifactorial from infections, hypernatremia -  Start lactulose 30gm BID -  Continue thiamine 100mg  IV daily -  PRN Geodon 10mg  q12h PRN agitation -  Lights on during day and up in chair -  Needs nutrition -  Will follow

## 2014-12-04 NOTE — Consult Note (Signed)
Palliative Medicine Inpatient Consult Follow Up Note   Name: Caleb Francis Date: 12/04/2014 MRN: [id number]  DOB: [birth year]  Referring Physician: Henreitta Leber, MD  Palliative Care consult requested for this 69 y.o. male for goals of medical therapy in patient with mantle cell lymphoma, getting chemotherapy, admitted with altered mental status  Caleb Francis is lying in bed in CCU. Intermittently agitated. Opens eyes and looks at me when I call his name. Followed simple command. Son at bedside.    REVIEW OF SYSTEMS:  Patient is not able to provide ROS  CODE STATUS: DNR   PAST MEDICAL HISTORY: Past Medical History  Diagnosis Date  . Arthritis   . Hypertension   . RA (rheumatoid arthritis)   . Anemia   . Cancer     lymphoma  . History of nuclear stress test     a. 12/2013: low risk, no sig ischemia, no EKG changes, no artifact, EF 63%  . Chronic kidney disease     PAST SURGICAL HISTORY:  Past Surgical History  Procedure Laterality Date  . Back surgery    . Fracture surgery      ankle  . Portacath placement    . Cystoscopy w/ ureteral stent placement Left 10/24/2014    Procedure: CYSTOSCOPY WITH RETROGRADE PYELOGRAM/URETERAL STENT PLACEMENT;  Surgeon: Irine Seal, MD;  Location: [facility] ORS;  Service: Urology;  Laterality: Left;    Vital Signs: BP 126/68 mmHg  Pulse 75  Temp(Src) 97.6 F (36.4 C) (Axillary)  Resp 31  Ht 6' (1.829 m)  Wt 73.483 kg (162 lb)  BMI 21.97 kg/m2  SpO2 100% Filed Weights   11/29/14 1222  Weight: 73.483 kg (162 lb)    Estimated body mass index is 21.97 kg/(m^2) as calculated from the following:   Height as of this encounter: 6' (1.829 m).   Weight as of this encounter: 73.483 kg (162 lb).  PHYSICAL EXAM: General: Critically ill appearing HEENT: OP clear, poor dentition Neck: Trachea midline  Cardiovascular: regular rate and rhythm Pulmonary/Chest: fair air movemnt ant fields, no audible wheeze Abdominal: Soft, hypoactive bowel  sounds GU: Foley present, clear yellow urine Extremities: no edema BLE's Neurological: moves extremities, follows simple commands (stick out tongue) Skin: no rashes Psychiatric: agitated  LABS: CBC:    Component Value Date/Time   WBC 2.7* 12/04/2014 0839   WBC 6.4 07/17/2014 1121   HGB 7.2* 12/04/2014 0839   HGB 11.8* 07/17/2014 1121   HCT 22.2* 12/04/2014 0839   HCT 35.0* 07/17/2014 1121   PLT 24* 12/04/2014 0839   PLT 263 07/17/2014 1121   MCV 87.0 12/04/2014 0839   MCV 83 07/17/2014 1121   NEUTROABS 1.6 12/04/2014 0839   NEUTROABS 3.4 07/17/2014 1121   LYMPHSABS 1.0 12/04/2014 0839   LYMPHSABS 1.9 07/17/2014 1121   MONOABS 0.1* 12/04/2014 0839   MONOABS 0.8 07/17/2014 1121   EOSABS 0.1 12/04/2014 0839   EOSABS 0.2 07/17/2014 1121   BASOSABS 0.0 12/04/2014 0839   BASOSABS 0.0 07/17/2014 1121   Comprehensive Metabolic Panel:    Component Value Date/Time   NA 165* 12/03/2014 0633   NA 141 07/17/2014 1121   K 3.2* 12/03/2014 1226   K 2.8* 07/17/2014 1121   CL >130* 12/03/2014 0633   CL 100 07/17/2014 1121   CO2 17* 12/03/2014 0633   CO2 32 07/17/2014 1121   BUN 69* 12/03/2014 0633   BUN 9 07/17/2014 1121   CREATININE 2.58* 12/03/2014 0633   CREATININE 1.09 07/17/2014 1121  GLUCOSE 175* 12/03/2014 0633   GLUCOSE 105* 07/17/2014 1121   CALCIUM 8.9 12/03/2014 0633   CALCIUM 8.9 07/17/2014 1121   AST 20 12/03/2014 2359   AST 17 07/17/2014 1121   ALT 17 12/03/2014 2359   ALT 12* 07/17/2014 1121   ALKPHOS 141* 12/03/2014 2359   ALKPHOS 79 07/17/2014 1121   BILITOT 2.1* 12/03/2014 2359   PROT 5.9* 12/03/2014 2359   PROT 7.3 07/17/2014 1121   ALBUMIN 2.0* 12/03/2014 2359   ALBUMIN 3.1* 07/17/2014 1121    IMPRESSION: Caleb Francis is a 69 yo man with PMH of mantle cell lymphoma dx 02/2013 on salvage chemo, HTN, RA, 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 He was hospitalzed 5/27-11/19/14 with abd pain, sepsis, A/CKD. He was discharged to SNF and continued  with chemotherapy. He was readmitted 11/29/14 with altered mental status. Urine and blood cx's + for Klebsiella. Mental status remains poor. MRI without contrast shows no acute change. EEG c/w metabolic encephalopathy.   Pt has improved albeit slightly. He is still agitated intermittently but has moments when he is somewhat lucid. Followed a command for me which he did not do yesterday.   I spoke with son at bedside and with daughter by phone. Updated both on pt's condition.   PLAN: As above   More than 50% of the visit was spent in counseling/coordination of care: YES  Time spent: 35 minutes

## 2014-12-04 NOTE — Progress Notes (Signed)
[place] at [facility]                                                                                                                                                                                            Patient Demographics   Caleb Francis, is a 69 y.o. male, DOB - May 11, 1946, ZOX:[id number]  Admit date - 11/29/2014   Admitting Physician Aldean Jewett, MD  Outpatient Primary MD for the patient is No PCP Per Patient   Subjective: Pt. Lying in bed w/ son at bedside. Encephalopathic.     Review of Systems:   Unable to provide due to patient is Encephalopathic.    Vitals:   Filed Vitals:   12/04/14 0700 12/04/14 0800 12/04/14 0900 12/04/14 1000  BP: 128/68 126/68 119/64 129/78  Pulse: 77 75 78 84  Temp: 97.6 F (36.4 C)     TempSrc: Axillary     Resp: 34 31 29 26   Height:      Weight:      SpO2: 100% 100% 100% 100%    Wt Readings from Last 3 Encounters:  11/29/14 73.483 kg (162 lb)  11/27/14 68.2 kg (150 lb 5.7 oz)  11/25/14 70.761 kg (156 lb)     Intake/Output Summary (Last 24 hours) at 12/04/14 1146 Last data filed at 12/04/14 1022  Gross per 24 hour  Intake 2189.13 ml  Output   1450 ml  Net 739.13 ml    Physical Exam:   GENERAL: Encephalopathic pt. Lying in bed.   HEAD, EYES, EARS, NOSE AND THROAT: Atraumatic, normocephalic. Extraocular muscles are intact. Pupils equal and reactive to light. Sclerae anicteric. No conjunctival injection. No oro-pharyngeal erythema. Dry Oral Mucosa.   NECK: Supple. There is no jugular venous distention. No bruits, no lymphadenopathy, no thyromegaly.  HEART:  tachycardic. No murmurs, no rubs, no clicks.  LUNGS: Clear to auscultation bilaterally. No rales, wheezes, Upper airway rhonchi b/l.    ABDOMEN: Soft, flat, nontender, nondistended. Has good bowel sounds. No hepatosplenomegaly appreciated.  EXTREMITIES: No evidence of any cyanosis, clubbing, or peripheral edema.  +2 pedal and  radial pulses bilaterally.  NEUROLOGIC: Encephalopathic but moves all ext. Spontaneously.  SKIN: Moist and warm with no rashes appreciated.  Psych:  Encephalopathic, agitated at times.      Antibiotics   Anti-infectives    Start     Dose/Rate Route Frequency Ordered Stop   12/01/14 0945  piperacillin-tazobactam (ZOSYN) IVPB 3.375 g  Status:  Discontinued     3.375 g 12.5 mL/hr over 240 Minutes Intravenous 3 times per day 12/01/14 0936 12/03/14 1337   11/30/14  1000  cefTRIAXone (ROCEPHIN) 2 g in dextrose 5 % 50 mL IVPB - Premix    Comments:  Entered per MD progress note   2 g 100 mL/hr over 30 Minutes Intravenous Every 24 hours 11/29/14 2157     11/29/14 1415  cefTRIAXone (ROCEPHIN) 1 g in dextrose 5 % 50 mL IVPB - Premix     1 g 100 mL/hr over 30 Minutes Intravenous  Once 11/29/14 1406 11/29/14 1452   11/29/14 1414  cefTRIAXone (ROCEPHIN) 20 MG/ML IVPB 50 mL    Comments:  MONAR, NELLIE: cabinet override      11/29/14 1414 11/29/14 1424      Medications   Scheduled Meds: . antiseptic oral rinse  7 mL Mouth Rinse BID  . aspirin  324 mg Oral Once  . cefTRIAXone (ROCEPHIN)  IV  2 g Intravenous Q24H  . diazepam  5 mg Intravenous Q6H  . filgrastim  480 mcg Subcutaneous Once  . sodium chloride  3 mL Intravenous Q12H  . thiamine IV  100 mg Intravenous Daily   Continuous Infusions: . dextrose     PRN Meds:.acetaminophen **OR** acetaminophen, haloperidol lactate, magnesium hydroxide, morphine injection   Data Review:   Micro Results Recent Results (from the past 240 hour(s))  Culture, blood (single)     Status: None   Collection Time: 11/29/14 11:09 AM  Result Value Ref Range Status   Specimen Description BLOOD  Final   Special Requests NONE  Final   Culture  Setup Time   Final    GRAM NEGATIVE RODS AEROBIC BOTTLE ONLY CRITICAL RESULT CALLED TO, READ BACK BY AND VERIFIED WITH: SANDRA VORBA ON 11/30/14 AT 0825 BY JEF    Culture KLEBSIELLA OXYTOCA AEROBIC BOTTLE ONLY    Final   Report Status 12/04/2014 FINAL  Final   Organism ID, Bacteria KLEBSIELLA OXYTOCA  Final      Susceptibility   Klebsiella oxytoca - MIC*    AMPICILLIN >=32 RESISTANT Resistant     CEFTAZIDIME <=1 SENSITIVE Sensitive     CEFAZOLIN >=64 RESISTANT Resistant     CEFTRIAXONE <=1 SENSITIVE Sensitive     CIPROFLOXACIN <=0.25 SENSITIVE Sensitive     GENTAMICIN <=1 SENSITIVE Sensitive     IMIPENEM <=0.25 SENSITIVE Sensitive     TRIMETH/SULFA <=20 SENSITIVE Sensitive     CEFOXITIN <=4 SENSITIVE Sensitive     * KLEBSIELLA OXYTOCA  Urine culture     Status: None   Collection Time: 11/29/14 11:22 AM  Result Value Ref Range Status   Specimen Description URINE, CLEAN CATCH  Final   Special Requests NONE  Final   Culture >=100,000 COLONIES/mL KLEBSIELLA OXYTOCA  Final   Report Status 12/01/2014 FINAL  Final   Organism ID, Bacteria KLEBSIELLA OXYTOCA  Final      Susceptibility   Klebsiella oxytoca - MIC*    AMPICILLIN >=32 RESISTANT Resistant     CEFAZOLIN >=64 RESISTANT Resistant     CEFTRIAXONE 4 SENSITIVE Sensitive     CIPROFLOXACIN <=0.25 SENSITIVE Sensitive     GENTAMICIN <=1 SENSITIVE Sensitive     IMIPENEM <=0.25 SENSITIVE Sensitive     NITROFURANTOIN <=16 SENSITIVE Sensitive     TRIMETH/SULFA <=20 SENSITIVE Sensitive     CEFOXITIN <=4 SENSITIVE Sensitive     * >=100,000 COLONIES/mL KLEBSIELLA OXYTOCA  MRSA PCR Screening     Status: None   Collection Time: 11/29/14 12:13 PM  Result Value Ref Range Status   MRSA by PCR NEGATIVE NEGATIVE Final    Comment:  The GeneXpert MRSA Assay (FDA approved for NASAL specimens only), is one component of a comprehensive MRSA colonization surveillance program. It is not intended to diagnose MRSA infection nor to guide or monitor treatment for MRSA infections.   Culture, blood (routine x 2)     Status: None   Collection Time: 11/29/14  1:19 PM  Result Value Ref Range Status   Specimen Description BLOOD  Final   Special Requests  Immunocompromised  Final   Culture NO GROWTH 5 DAYS  Final   Report Status 12/04/2014 FINAL  Final  Urine culture     Status: None   Collection Time: 11/30/14 10:12 AM  Result Value Ref Range Status   Specimen Description URINE, CATHETERIZED  Final   Special Requests Immunocompromised  Final   Culture NO GROWTH 2 DAYS  Final   Report Status 12/02/2014 FINAL  Final    Radiology Reports Ct Head Wo Contrast  11/29/2014   CLINICAL DATA:  Concern for chemotherapy adverse reaction. Patient transfer from cancer center. Altered mental status.  EXAM: CT HEAD WITHOUT CONTRAST  TECHNIQUE: Contiguous axial images were obtained from the base of the skull through the vertex without intravenous contrast.  COMPARISON:  Head CT 11/09/2014  FINDINGS: No acute intracranial hemorrhage. No focal mass lesion. No CT evidence of acute infarction. No midline shift or mass effect. No hydrocephalus. Basilar cisterns are patent.  There is mild generalized cortical atrophy and proportional ventricular dilatation. Mild periventricular subcortical white matter hypodensities.  Paranasal sinuses and  mastoid air cells are clear.  IMPRESSION: 1. No acute intracranial findings.  No change from prior. 2. Mild atrophy and white matter microvascular disease.   Electronically Signed   By: Suzy Bouchard M.D.   On: 11/29/2014 13:48   Ct Head Wo Contrast  11/09/2014   CLINICAL DATA:  Degenerating mental status, sepsis and history of progressive mantle cell lymphoma.  EXAM: CT HEAD WITHOUT CONTRAST  TECHNIQUE: Contiguous axial images were obtained from the base of the skull through the vertex without intravenous contrast.  COMPARISON:  05/21/2009  FINDINGS: No findings by unenhanced head CT to suggest brain involvement by lymphoma. The brain demonstrates no evidence of hemorrhage, infarction, edema, mass effect, extra-axial fluid collection, hydrocephalus or mass lesion. The skull is unremarkable.  IMPRESSION: Unremarkable head CT.    Electronically Signed   By: Aletta Edouard M.D.   On: 11/09/2014 13:30   Mr Brain Wo Contrast  12/02/2014   CLINICAL DATA:  69 year old male with altered mental status, recent sepsis and urinary tract infection. Undergoing chemotherapy for mantle cell lymphoma. Initial encounter.  EXAM: MRI HEAD WITHOUT CONTRAST  TECHNIQUE: Multiplanar, multiecho pulse sequences of the brain and surrounding structures were obtained without intravenous contrast.  COMPARISON:  Head CTs without contrast 11/29/2014 and earlier.  FINDINGS: Study is intermittently degraded by motion artifact despite repeated imaging attempts.  Cerebral volume is within normal limits for age. No restricted diffusion to suggest acute infarction. No midline shift, mass effect, evidence of mass lesion, ventriculomegaly, extra-axial collection or acute intracranial hemorrhage. Cervicomedullary junction and pituitary are within normal limits. Major intracranial vascular flow voids are grossly preserved at the skullbase.  Minimal to mild for age nonspecific cerebral white matter signal changes. 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 No other encephalomalacia identified.  Mild left mastoid effusion has been present since a 2014 neck CT comparison. Right mastoids are clear. Paranasal sinuses are clear. Negative orbit and scalp soft tissues.  IMPRESSION: 1. No  acute infarct or acute intracranial abnormality evident on this noncontrast exam. Early metastatic/lymphoma involvement of the brain cannot be excluded in the absence of intravenous contrast. 2. 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 3. Mild chronic left mastoid effusion, most often postinflammatory and significance doubtful.   Electronically Signed   By: Genevie Ann M.D.   On: 12/02/2014 16:45   Ct Abdomen Pelvis W Contrast  11/25/2014   CLINICAL DATA:  69 year old male with history of mantle cell lymphoma recently treated with chemotherapy.  EXAM: CT ABDOMEN AND  PELVIS WITH CONTRAST  TECHNIQUE: Multidetector CT imaging of the abdomen and pelvis was performed using the standard protocol following bolus administration of intravenous contrast.  CONTRAST:  64mL OMNIPAQUE IOHEXOL 300 MG/ML  SOLN  COMPARISON:  CT of the abdomen and pelvis 11/01/2014.  FINDINGS: Lower chest: Retrocrural lymph nodes are again noted bilaterally, largest of which on the right measures 1.6 cm in short axis, similar to the prior examination. Small amount of pericardial fluid and/or thickening, unlikely to be of any hemodynamic significance at this time.  Hepatobiliary: Numerous tiny partially calcified gallstones are noted within the gallbladder. No findings to suggest acute cholecystitis at this time. No significant cystic or solid hepatic lesions. No intra or extrahepatic biliary ductal dilatation.  Pancreas: No pancreatic mass. No pancreatic ductal dilatation. No pancreatic or peripancreatic fluid or inflammatory changes.  Spleen: Spleen remains mildly enlarged, but is smaller than the prior examination, currently measuring up to 13.2 cm in length.  Adrenals/Urinary Tract: Thickening of the left adrenal gland is unchanged. Right adrenal gland is normal. Right kidney is normal in appearance. There is a left-sided double-J ureteral stent in position, with the proximal loop reformed in the interpolar region of the left renal collecting system, and the distal loop performed in the lumen of the urinary bladder. Left kidney is otherwise normal in appearance. No hydroureteronephrosis. Mild diffuse bladder wall thickening, likely in part related to underdistention of the bladder. No discrete bladder mass identified.  Stomach/Bowel: The appearance of the stomach is normal. No pathologic dilatation of small bowel or colon. Normal appendix.  Vascular/Lymphatic: Extensive lymphadenopathy is again noted throughout the abdomen and pelvis. The bulkiest retroperitoneal node or nodal conglomerate is in the left  para-aortic nodal station immediately inferior to the left renal hilum, and this has decreased in size compared to the prior study, currently measuring 6.4 x 5.3 cm (image 25 of series 2), as compared with 8.8 x 6.9 cm on prior exam 11/09/2014. Bulky pelvic adenopathy is again noted bilaterally, generally similar to the prior study, with index lymph node in the left external iliac nodal station (image 57 of series 2) only slightly smaller than the prior examination, currently measuring 3.6 x 5.0 cm (previously 4.2 x 5.1 cm). However, some lymph nodes are larger than the prior examination, including a right external iliac lymph node, which currently measures 4.9 x 3.5 cm (image 59 of series 2) as compared with 4.7 x 3.0 cm on the prior study. Centrally low-attenuation presumably necrotic lymph node in the left common femoral nodal station measuring 3.4 x 4.8 cm. Extensive atherosclerosis throughout the abdominal and pelvic vasculature, without evidence of aneurysm or dissection.  Reproductive: Coarse calcifications are noted in the prostate gland (nonspecific). Seminal vesicles are unremarkable in appearance.  Other: No significant volume of ascites.  No pneumoperitoneum.  Musculoskeletal: Status post L4 laminectomy. There are no aggressive appearing lytic or blastic lesions noted in the visualized portions of the skeleton.  IMPRESSION:  1. Today's study generally demonstrates slight positive response to therapy, with generalized decrease of lymphadenopathy in the abdomen and pelvis. However, there several lymph nodes which are essentially unchanged, and some lymph nodes which are slightly increased compared to the prior examination, as discussed above. 2. Additional findings, as above, similar to the prior examination.   Electronically Signed   By: Vinnie Langton M.D.   On: 11/25/2014 13:12   Ct Abdomen Pelvis W Contrast  11/09/2014   CLINICAL DATA:  Diffuse abdominal pain.  Chemotherapy yesterday.  EXAM: CT  ABDOMEN AND PELVIS WITH CONTRAST  TECHNIQUE: Multidetector CT imaging of the abdomen and pelvis was performed using the standard protocol following bolus administration of intravenous contrast.  CONTRAST:  180mL OMNIPAQUE IOHEXOL 300 MG/ML  SOLN  COMPARISON:  10/23/2014  FINDINGS: BODY WALL: Partly visible left lower axillary lymphadenopathy, recently evaluated by chest CT 10/30/2014.  There is bulky bilateral inguinal lymphadenopathy which has increased from 10/23/2014, with left inguinal node on image 88 measuring 31 mm short axis, previously 23 mm  LOWER CHEST: Reticular opacities in the lower lungs, greater on the right, is favored atelectasis. No definitive pneumonia. Lower thoracic lymphadenopathy has increased, with a left retrocrural lymph node currently 15 mm on image 23, previously 10 mm.  ABDOMEN/PELVIS:  Liver: No focal abnormality.  Biliary: Cholelithiasis. The gallbladder is distended but there is no inflammatory wall thickening.  Pancreas: Unremarkable.  Spleen: Mild splenomegaly, 15 cm in craniocaudal span. This is increased by approximately 4 cm since previous.  Adrenals: Negative.  Kidneys and ureters: Moderate bilateral hydroureteronephrosis to the level of the pelvis. This is despite an internal ureteral stent on the left which is well positioned. Symmetric renal enhancement.  Bladder: Thickening of the bladder wall, preferentially to the left, stable from previous. This is indeterminate and will be assessed on followup imaging.  Reproductive: No pathologic findings.  Bowel: No obstruction. No inflammatory bowel wall thickening  Retroperitoneum: There is bulky and diffuse retroperitoneal lymphadenopathy. There has been significant and rapid increased from previous. Index left periaortic lymph node on image 39 measures 88 x 69 mm (previously 59 x 35 mm) on image 39). Left pelvic sidewall lymph node that was previously measured is on image 71 and stable at 58 x 41 mm. There is new enlargement of  upper abdominal retroperitoneal lymph nodes . Some cystic change, presumably treatment related, present in the largest nodes, especially in the left pelvis.  Vascular: Mass effect on the left iliac veins by lymphadenopathy, with progressive subcutaneous edema in the left upper thigh.  OSSEOUS: No acute abnormalities.  IMPRESSION: 1. Rapid progression of lymphoma with marked enlargement of lymph nodes compared to 10/23/2014. 2. Bilateral hydroureteronephrosis, new on the right and unchanged on the left (despite a well-positioned left internal ureteral stent). 3. Progressive edema in the upper left thigh. Given severe compression of the left iliac vein by lymphadenopathy, consider Doppler to evaluate for DVT.   Electronically Signed   By: Monte Fantasia M.D.   On: 11/09/2014 02:13   US Renal  11/30/2014   CLINICAL DATA:  Lymphoma, urinary tract infection, previous placement of a left ureteral stent for obstructive lymphadenopathy and mass effect. Followup  EXAM: RENAL / URINARY TRACT ULTRASOUND COMPLETE  COMPARISON:  CT 11/25/2014  FINDINGS: Right Kidney:  Length: 12.4 cm. No mass or hydronephrosis. Mild cortical atrophy with increased cortical echogenicity.  Left Kidney:  Length: 14.3 cm. Improvement in now mild hydronephrosis. Mild left renal cortical thinning and increased echogenicity. Stent  position not well visualized.  Bladder:  Concentric bladder wall thickening, 0.9 cm.  IMPRESSION: Improvement in now mild left hydronephrosis compared to the dissimilar prior exam.  Bilateral increased renal cortical echogenicity suggesting medical renal disease with mild degree of cortical atrophy.  Concentric bladder wall thickening which is nonspecific and may be seen with urinary tract infection/ cystitis, infiltrative processes including lymphomatous involvement, or treatment effect.   Electronically Signed   By: Conchita Paris M.D.   On: 11/30/2014 11:24   [place] Extrem Low Left Comp  11/09/2014   CLINICAL DATA:   Left calf swelling and lower extremity pain. History of lymphoma.  EXAM: Left LOWER EXTREMITY VENOUS DOPPLER ULTRASOUND  TECHNIQUE: Gray-scale sonography with graded compression, as well as color Doppler and duplex ultrasound were performed to evaluate the lower extremity deep venous systems from the level of the common femoral vein and including the common femoral, femoral, profunda femoral, popliteal and calf veins including the posterior tibial, peroneal and gastrocnemius veins when visible. The superficial great saphenous vein was also interrogated. Spectral Doppler was utilized to evaluate flow at rest and with distal augmentation maneuvers in the common femoral, femoral and popliteal veins.  COMPARISON:  None.  FINDINGS: Contralateral Common Femoral Vein: Respiratory phasicity is normal and symmetric with the symptomatic side. No evidence of thrombus. Normal compressibility.  Common Femoral Vein: No evidence of thrombus. Normal compressibility, respiratory phasicity and response to augmentation.  Saphenofemoral Junction - Superficial Great Saphenous Vein: Due to patient pain, the patient requested that the examination be terminated therefore the superficial femoral vein through popliteal trifurcation could not be evaluated.  IMPRESSION: No evidence of deep venous thrombosis involving the upper common femoral vein. The patient requested that the examination be terminated and did not allow the sonographer to examine the lower extremity venous system from the level of the saphenous femoral junction through the popliteal trifurcation.   Electronically Signed   By: Conchita Paris M.D.   On: 11/09/2014 09:40   Dg Chest Portable 1 View  11/29/2014   CLINICAL DATA:  Altered mental status. Possible reaction to chemotherapy.  EXAM: PORTABLE CHEST - 1 VIEW  COMPARISON:  11/15/2014  FINDINGS: Mild-to-moderate enlargement of the cardiopericardial silhouette with cephalization of blood flow. Faint Kerley B-lines noted  at the right lung base, but no airspace edema.  Right IJ Port-A-Cath tip: SVC.  Tortuous thoracic aorta.  Deformity from prior surgical neck fracture the right humerus which appears healed.  IMPRESSION: Mild to moderate enlargement of the cardiopericardial silhouette with faint interstitial edema. No airspace opacity identified.   Electronically Signed   By: Van Clines M.D.   On: 11/29/2014 13:31   Dg Chest Port 1 View  11/15/2014   CLINICAL DATA:  Shortness of breath and cough.  Weakness.  EXAM: PORTABLE CHEST - 1 VIEW  COMPARISON:  11/08/2014  FINDINGS: Tip of the right chest port remains in the SVC. The heart is at the upper limits normal in size. There are increased perihilar markings, progressed from prior exam. Increased right basilar atelectasis, now with mild left basilar atelectasis. No definite pleural effusion or pneumothorax.  IMPRESSION: Increased perihilar markings concerning pulmonary edema. Increased right basilar atelectasis in development of mild left basilar atelectasis.   Electronically Signed   By: Jeb Levering M.D.   On: 11/15/2014 04:43   Dg Chest Port 1 View  11/08/2014   CLINICAL DATA:  Acute onset of sepsis.  Initial encounter.  EXAM: PORTABLE CHEST - 1 VIEW  COMPARISON:  Chest radiograph performed 11/12/2013, and CTA of the chest performed 10/30/2014  FINDINGS: The lungs are well-aerated. Mild vascular congestion is noted, with mild right basilar atelectasis. There is no evidence of pleural effusion or pneumothorax.  The cardiomediastinal silhouette is within normal limits. No acute osseous abnormalities are seen. A right-sided chest port is noted ending about the mid SVC.  IMPRESSION: Mild vascular congestion, with mild right basilar atelectasis. Lungs otherwise clear.   Electronically Signed   By: Garald Balding M.D.   On: 11/08/2014 23:06     CBC  Recent Labs Lab 11/28/14 0913 11/29/14 1318 11/30/14 0500  12/01/14 0436 12/02/14 0353 12/03/14 0437  12/03/14 0633 12/04/14 0839  WBC 11.9* 9.5 8.5  --  7.4 6.3 3.6* 3.7* 2.7*  HGB 7.1* 7.0* 6.6*  < > 8.2* 7.7* 6.8* 7.3* 7.2*  HCT 21.7* 21.9* 20.2*  < > 24.8* 23.8* 20.8* 22.4* 22.2*  PLT 284 288 276  --  191 123*  --  57* 24*  MCV 84.8 86.5 84.9  --  85.7 86.2 86.1 87.8 87.0  MCH 27.5 27.5 27.7  --  28.3 28.0 28.3 28.4 28.2  MCHC 32.5 31.8* 32.7  --  33.1 32.5 32.9 32.4 32.5  RDW 19.7* 20.2* 19.8*  --  18.8* 19.0* 19.0* 19.5* 20.0*  LYMPHSABS 0.8* 0.4*  --   --   --   --   --   --  1.0  MONOABS 0.6 0.2  --   --   --   --   --   --  0.1*  EOSABS 0.0 0.0  --   --   --   --   --   --  0.1  BASOSABS 0.0 0.0  --   --   --   --   --   --  0.0  < > = values in this interval not displayed.  Chemistries   Recent Labs Lab 11/29/14 1009 11/29/14 1318  12/01/14 0436 12/02/14 0353 12/03/14 0437 12/03/14 0633 12/03/14 1226 12/03/14 2359 12/04/14 0459  NA 137 139  < > 146* 154* 160* 165*  --   --  163*  K 4.8 4.7  < > 3.6 3.1* 2.9* 3.2* 3.2*  --  2.8*  CL 108 112*  < > 123* 126* >130* >130*  --   --  >130*  CO2 19* 19*  < > 19* 18* 18* 17*  --   --  19*  GLUCOSE 139* 127*  < > 173* 161* 191* 175*  --   --  182*  BUN 24* 25*  < > 43* 56* 72* 69*  --   --  66*  CREATININE 1.24 1.21  < > 1.91* 2.18* 2.61* 2.58*  --   --  2.45*  CALCIUM 8.4* 8.7*  < > 8.4* 8.7* 8.3* 8.9  --   --  9.0  MG  --   --   --   --   --  2.6* 2.5*  --   --  2.4  AST 44* 43*  --   --   --   --   --   --  20  --   ALT 45 42  --   --   --   --   --   --  17  --   ALKPHOS 283* 268*  --   --   --   --   --   --  141*  --   BILITOT 0.5  0.6  --   --   --   --   --   --  2.1*  --   < > = values in this interval not displayed. ------------------------------------------------------------------------------------------------------------------ estimated creatinine clearance is 30.4 mL/min (by C-G formula based on Cr of  2.45). ------------------------------------------------------------------------------------------------------------------ No results for input(s): HGBA1C in the last 72 hours. ------------------------------------------------------------------------------------------------------------------ No results for input(s): CHOL, HDL, LDLCALC, TRIG, CHOLHDL, LDLDIRECT in the last 72 hours. ------------------------------------------------------------------------------------------------------------------  Recent Labs  12/03/14 2359  TSH 0.362   ------------------------------------------------------------------------------------------------------------------  Recent Labs  12/03/14 2359  FOLATE 22.2    Coagulation profile  Recent Labs Lab 11/29/14 1318  INR 1.33    No results for input(s): DDIMER in the last 72 hours.  Cardiac Enzymes  Recent Labs Lab 11/29/14 1549 11/29/14 2121 11/30/14 0412  TROPONINI 0.06* 0.05* 0.07*   ------------------------------------------------------------------------------------------------------------------ Invalid input(s): POCBNP  Assessment & Plan   #1 Sepsis: Likely due to UTI. Patient's urine culture and blood cultures are positive for Klebsiella.  - Hemodynamically stable, afebrile. Continue IV ceftriaxone. We'll repeat blood cultures to make sure they're clearing.  #2 altered mental status - This is likely due to metabolic encephalopathy due to sepsis and urinary tract infection/ARF/Hypernatremia.  - CT head/MRI brain showing no evidence of acute pathology.  Seen by Neurology and as per them this is likely metabolic encephalopathy too.  - cont. D5W for hypernatremia, cont. IV abx for UTI and follow mental status.  - avoid Benzo's.  Off Precedex gtt and cont. PRN Geodon, Valium for now.   #3 chest pain: No EKG changes to suggest ischemia.  Continue to monitor for any further cardiac symptoms  #4 urinary tract infection: Continue IV  ceftriaxone. Urine culture positive for Klebsiella.  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 Patient follows with Dr. Mike Gip.    #6. Anemia: Due to chemotherapy and Mantle cell lymphoma, status post 2 units of packed RBCs and hemoglobin stable and will continue to monitor.  #7. Acute renal failure with hypernatremia: Appreciate nephrology input. -Continue D5W and follow sodium.  Creatinine improving and urine output is fair with greater than 1500 cc of urine in the past 24 hours.  #8 Hypokalemia - likely due to dehydration and will cont. To supplement and repeat level in a.m.  - Mg level normal.    #9 Thrombocytopenia - likely due to sepsis and will monitor counts.  Avoid heparin products.   No acute bleeding.    Prognosis seems to be very poor palliative care consult appreciated patient is DO NOT RESUSCITATE     Code Status Orders        Start     Ordered   11/29/14 1731  Full code   Continuous     11/29/14 1730     DNR  DVT Prophylaxis   SCD's   Lab Results  Component Value Date   PLT 24* 12/04/2014     Total Time Spent in minutes - 30 min    SAINANI,VIVEK J M.D on 12/04/2014 at 11:46 AM  Between 7am to 6pm - Pager - [phone]  After 6pm go to [email or web address] - password [email or web address] [facility]   Office  [phone]

## 2014-12-04 NOTE — Progress Notes (Signed)
[facility] Hematology/Oncology Progress Note  Date of admission: 11/29/2014  Hospital day:  12/03/2014  Chief Complaint: Caleb Francis is a 69 y.o. male who was admitted with altered mental status and a urinary tract infection on day 3 of cycle #2 RICE chemotherapy.  Subjective: Patient combative with a sitter in the ICU.  Events of last several days noted.  Klebsiella oxytoca bacteremia from urinary tract infection.  Mental status has declined since admission.  Now confused/combative.  Development of hypernatremia. Mild pancytopenia.  Social History: The patient is accompanied by a sitter this evening.  Allergies: No Known Allergies  Scheduled Medications: . antiseptic oral rinse  7 mL Mouth Rinse BID  . aspirin  324 mg Oral Once  . cefTRIAXone (ROCEPHIN)  IV  2 g Intravenous Q24H  . diazepam  5 mg Intravenous Q6H  . filgrastim  480 mcg Subcutaneous Once  . sodium chloride  3 mL Intravenous Q12H  . thiamine IV  100 mg Intravenous Daily    Review of Systems:  Unable to be obtained.  Patient combative. PERFORMANCE STATUS (ECOG):  4  Physical Exam: Blood pressure 107/61, pulse 83, temperature 97.6 F (36.4 C), temperature source Axillary, resp. rate 24, height 6' (1.829 m), weight 162 lb (73.483 kg), SpO2 98 %.  GENERAL:  Chronically ill appearing gentleman lying in bed in the ICU, flailing his arms. MENTAL STATUS:  Agitated. Unable to cooperate.[name] HEAD:  Thin short graying hair.  Normocephalic, atraumatic, face symmetric, no Cushingoid features. EYES:  Brown eyes.  Pupils equal round and reactive to light and accomodation.  No conjunctivitis or scleral icterus. ENT:  Oropharynx dry.  Tongue normal.  RESPIRATORY:  Clear to auscultation anteriorly. CARDIOVASCULAR:  Regular rate and rhythm without murmur, rub or gallop. ABDOMEN:  Soft, non-tender, with active bowel sounds, and no hepatosplenomegaly.  No masses. SKIN:  No rashes, ulcers or  lesions. EXTREMITIES: No edema, no skin discoloration or tenderness.  No palpable cords. LYMPH NODES: Continued dramatic improvement in adenopathy.  Left axillary adenopathy 2 cm.  Left inguinal adenopathy no longer palpable. NEUROLOGICAL: Moves all extremities.  Unable to follow commands.. PSYCH:  Agitated.  Results for orders placed or performed during the hospital encounter of 11/29/14 (from the past 48 hour(s))  Glucose, capillary     Status: Abnormal   Collection Time: 12/02/14  6:43 PM  Result Value Ref Range   Glucose-Capillary 147 (H) 65 - 99 mg/dL  Glucose, capillary     Status: Abnormal   Collection Time: 12/03/14 12:13 AM  Result Value Ref Range   Glucose-Capillary 137 (H) 65 - 99 mg/dL  CBC     Status: Abnormal   Collection Time: 12/03/14  4:37 AM  Result Value Ref Range   WBC 3.6 (L) 4.0 - 10.5 K/uL    Comment: TEST WILL BE CREDITED NOTIFIED BEN CASSIDY @ 0617 6.21.16 MPG    RBC 2.41 (L) 4.22 - 5.81 MIL/uL    Comment: TEST WILL BE CREDITED   Hemoglobin 6.8 (LL) 13.0 - 17.0 g/dL    Comment: TEST WILL BE CREDITED   HCT 20.8 (L) 39.0 - 52.0 %    Comment: TEST WILL BE CREDITED   MCV 86.1 78.0 - 100.0 fL    Comment: TEST WILL BE CREDITED   MCH 28.3 26.0 - 34.0 pg    Comment: TEST WILL BE CREDITED   MCHC 32.9 30.0 - 36.0 g/dL    Comment: TEST WILL BE CREDITED   RDW 19.0 (H) 11.5 -  15.5 %    Comment: TEST WILL BE CREDITED  Basic metabolic panel     Status: Abnormal   Collection Time: 12/03/14  4:37 AM  Result Value Ref Range   Sodium 160 (H) 135 - 145 mmol/L   Potassium 2.9 (LL) 3.5 - 5.1 mmol/L    Comment: CRITICAL RESULT CALLED TO, READ BACK BY AND VERIFIED WITH BEN CASSIDY @ 0521 6.21.16 MPG    Chloride >130 (HH) 101 - 111 mmol/L    Comment: CRITICAL RESULT CALLED TO, READ BACK BY AND VERIFIED WITH BEN CASSIDY @ 0521 6.21.16 MPG    CO2 18 (L) 22 - 32 mmol/L   Glucose, Bld 191 (H) 65 - 99 mg/dL   BUN 72 (H) 6 - 20 mg/dL   Creatinine, Ser 2.61 (H) 0.61 - 1.24  mg/dL   Calcium 8.3 (L) 8.9 - 10.3 mg/dL   GFR calc non Af Amer 24 (L) >60 mL/min   GFR calc Af Amer 28 (L) >60 mL/min    Comment: (NOTE) The eGFR has been calculated using the CKD EPI equation. This calculation has not been validated in all clinical situations. eGFR's persistently <60 mL/min signify possible Chronic Kidney Disease.    Anion gap SEE COMMENTS 5 - 15    Comment: UNABLE TO CALCULATE  Magnesium     Status: Abnormal   Collection Time: 12/03/14  4:37 AM  Result Value Ref Range   Magnesium 2.6 (H) 1.7 - 2.4 mg/dL  Phosphorus     Status: None   Collection Time: 12/03/14  4:37 AM  Result Value Ref Range   Phosphorus 4.3 2.5 - 4.6 mg/dL  Glucose, capillary     Status: Abnormal   Collection Time: 12/03/14  5:28 AM  Result Value Ref Range   Glucose-Capillary 168 (H) 65 - 99 mg/dL  Uric acid     Status: None   Collection Time: 12/03/14  6:33 AM  Result Value Ref Range   Uric Acid, Serum 7.3 4.4 - 7.6 mg/dL  Albumin     Status: Abnormal   Collection Time: 12/03/14  6:33 AM  Result Value Ref Range   Albumin 2.1 (L) 3.5 - 5.0 g/dL  Basic metabolic panel     Status: Abnormal   Collection Time: 12/03/14  6:33 AM  Result Value Ref Range   Sodium 165 (HH) 135 - 145 mmol/L    Comment: CRITICAL RESULT CALLED TO, READ BACK BY AND VERIFIED WITH: BRITTANY RUDD @ 0716 12/03/14 BY DAS    Potassium 3.2 (L) 3.5 - 5.1 mmol/L   Chloride >130 (HH) 101 - 111 mmol/L    Comment: CRITICAL RESULT CALLED TO, READ BACK BY AND VERIFIED WITH   CO2 17 (L) 22 - 32 mmol/L   Glucose, Bld 175 (H) 65 - 99 mg/dL   BUN 69 (H) 6 - 20 mg/dL   Creatinine, Ser 2.58 (H) 0.61 - 1.24 mg/dL   Calcium 8.9 8.9 - 10.3 mg/dL   GFR calc non Af Amer 24 (L) >60 mL/min   GFR calc Af Amer 28 (L) >60 mL/min    Comment: (NOTE) The eGFR has been calculated using the CKD EPI equation. This calculation has not been validated in all clinical situations. eGFR's persistently <60 mL/min signify possible Chronic  Kidney Disease.    Anion gap NOT CALCULATED 5 - 15  CBC     Status: Abnormal   Collection Time: 12/03/14  6:33 AM  Result Value Ref Range   WBC 3.7 (L) 3.8 -  10.6 K/uL   RBC 2.55 (L) 4.40 - 5.90 MIL/uL   Hemoglobin 7.3 (L) 13.0 - 18.0 g/dL   HCT 22.4 (L) 40.0 - 52.0 %   MCV 87.8 80.0 - 100.0 fL   MCH 28.4 26.0 - 34.0 pg   MCHC 32.4 32.0 - 36.0 g/dL   RDW 19.5 (H) 11.5 - 14.5 %   Platelets 57 (L) 150 - 440 K/uL    Comment: PLATELET COUNT CONFIRMED BY SMEAR  Magnesium     Status: Abnormal   Collection Time: 12/03/14  6:33 AM  Result Value Ref Range   Magnesium 2.5 (H) 1.7 - 2.4 mg/dL  Phosphorus     Status: None   Collection Time: 12/03/14  6:33 AM  Result Value Ref Range   Phosphorus 4.4 2.5 - 4.6 mg/dL  Blood gas, arterial     Status: Abnormal   Collection Time: 12/03/14 11:36 AM  Result Value Ref Range   FIO2 21.00 %   pH, Arterial 7.43 7.350 - 7.450   pCO2 arterial 25 (L) 32.0 - 48.0 mmHg   pO2, Arterial 77 (L) 83.0 - 108.0 mmHg   Bicarbonate 16.6 (L) 21.0 - 28.0 mEq/L   Acid-base deficit 6.0 (H) 0.0 - 2.0 mmol/L   O2 Saturation 95.6 %   Patient temperature 37.0    Collection site RIGHT BRACHIAL    Sample type ARTERIAL DRAW    Allens test (pass/fail) POSITIVE (A) PASS  Glucose, capillary     Status: Abnormal   Collection Time: 12/03/14 12:13 PM  Result Value Ref Range   Glucose-Capillary 166 (H) 65 - 99 mg/dL   Comment 1 Notify RN   Potassium     Status: Abnormal   Collection Time: 12/03/14 12:26 PM  Result Value Ref Range   Potassium 3.2 (L) 3.5 - 5.1 mmol/L  Glucose, capillary     Status: Abnormal   Collection Time: 12/03/14  5:27 PM  Result Value Ref Range   Glucose-Capillary 140 (H) 65 - 99 mg/dL  Ammonia     Status: Abnormal   Collection Time: 12/03/14 10:47 PM  Result Value Ref Range   Ammonia 53 (H) 9 - 35 umol/L  Glucose, capillary     Status: Abnormal   Collection Time: 12/03/14 11:53 PM  Result Value Ref Range   Glucose-Capillary 169 (H) 65 - 99  mg/dL  TSH     Status: None   Collection Time: 12/03/14 11:59 PM  Result Value Ref Range   TSH 0.362 0.350 - 4.500 uIU/mL  Folate     Status: None   Collection Time: 12/03/14 11:59 PM  Result Value Ref Range   Folate 22.2 >5.9 ng/mL  Hepatic function panel     Status: Abnormal   Collection Time: 12/03/14 11:59 PM  Result Value Ref Range   Total Protein 5.9 (L) 6.5 - 8.1 g/dL   Albumin 2.0 (L) 3.5 - 5.0 g/dL   AST 20 15 - 41 U/L   ALT 17 17 - 63 U/L   Alkaline Phosphatase 141 (H) 38 - 126 U/L   Total Bilirubin 2.1 (H) 0.3 - 1.2 mg/dL   Bilirubin, Direct 1.2 (H) 0.1 - 0.5 mg/dL   Indirect Bilirubin 0.9 0.3 - 0.9 mg/dL  Sedimentation rate     Status: Abnormal   Collection Time: 12/03/14 11:59 PM  Result Value Ref Range   Sed Rate 127 (H) 0 - 20 mm/hr  Magnesium     Status: None   Collection Time: 12/04/14  4:59  AM  Result Value Ref Range   Magnesium 2.4 1.7 - 2.4 mg/dL  Phosphorus     Status: None   Collection Time: 12/04/14  4:59 AM  Result Value Ref Range   Phosphorus 3.4 2.5 - 4.6 mg/dL  Basic metabolic panel     Status: Abnormal   Collection Time: 12/04/14  4:59 AM  Result Value Ref Range   Sodium 163 (HH) 135 - 145 mmol/L    Comment: CRITICAL RESULT CALLED TO, READ BACK BY AND VERIFIED WITH OLGA MCLEOD @ 0559 6.22.16 MPG    Potassium 2.8 (LL) 3.5 - 5.1 mmol/L    Comment: CRITICAL RESULT CALLED TO, READ BACK BY AND VERIFIED WITH OLGA MCLEOD @ 0559 6.22.16 MPG    Chloride >130 (HH) 101 - 111 mmol/L    Comment: CRITICAL RESULT CALLED TO, READ BACK BY AND VERIFIED WITH OLGA MCLEOD @ 0559 6.22.16 MPG    CO2 19 (L) 22 - 32 mmol/L   Glucose, Bld 182 (H) 65 - 99 mg/dL   BUN 66 (H) 6 - 20 mg/dL   Creatinine, Ser 2.45 (H) 0.61 - 1.24 mg/dL   Calcium 9.0 8.9 - 10.3 mg/dL   GFR calc non Af Amer 26 (L) >60 mL/min   GFR calc Af Amer 30 (L) >60 mL/min    Comment: (NOTE) The eGFR has been calculated using the CKD EPI equation. This calculation has not been validated in all  clinical situations. eGFR's persistently <60 mL/min signify possible Chronic Kidney Disease.    Anion gap NOT CALCULATED 5 - 15  Glucose, capillary     Status: Abnormal   Collection Time: 12/04/14  5:37 AM  Result Value Ref Range   Glucose-Capillary 167 (H) 65 - 99 mg/dL  CBC with Differential     Status: Abnormal   Collection Time: 12/04/14  8:39 AM  Result Value Ref Range   WBC 2.7 (L) 3.8 - 10.6 K/uL   RBC 2.55 (L) 4.40 - 5.90 MIL/uL   Hemoglobin 7.2 (L) 13.0 - 18.0 g/dL   HCT 22.2 (L) 40.0 - 52.0 %   MCV 87.0 80.0 - 100.0 fL   MCH 28.2 26.0 - 34.0 pg   MCHC 32.5 32.0 - 36.0 g/dL   RDW 20.0 (H) 11.5 - 14.5 %   Platelets 24 (LL) 150 - 440 K/uL    Comment: CRITICAL RESULT CALLED TO, READ BACK BY AND VERIFIED WITH: HP TO CHRIS LARSON@[email or web address]  ON 12/04/14. PLATELET COUNT CONFIRMED BY SMEAR    Neutrophils Relative % 60% %   Neutro Abs 1.6 1.4 - 6.5 K/uL   Lymphocytes Relative 35% %   Lymphs Abs 1.0 1.0 - 3.6 K/uL   Monocytes Relative 3% %   Monocytes Absolute 0.1 (L) 0.2 - 1.0 K/uL   Eosinophils Relative 2% %   Eosinophils Absolute 0.1 0 - 0.7 K/uL   Basophils Relative 0% %   Basophils Absolute 0.0 0 - 0.1 K/uL   Mr Brain Wo Contrast  12/02/2014   CLINICAL DATA:  69 year old male with altered mental status, recent sepsis and urinary tract infection. Undergoing chemotherapy for mantle cell lymphoma. Initial encounter.  EXAM: MRI HEAD WITHOUT CONTRAST  TECHNIQUE: Multiplanar, multiecho pulse sequences of the brain and surrounding structures were obtained without intravenous contrast.  COMPARISON:  Head CTs without contrast 11/29/2014 and earlier.  FINDINGS: Study is intermittently degraded by motion artifact despite repeated imaging attempts.  Cerebral volume is within normal limits for age. No restricted diffusion to suggest acute infarction. No midline  shift, mass effect, evidence of mass lesion, ventriculomegaly, extra-axial collection or acute intracranial hemorrhage.  Cervicomedullary junction and pituitary are within normal limits. Major intracranial vascular flow voids are grossly preserved at the skullbase.  Minimal to mild for age nonspecific cerebral white matter signal changes. 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 No other encephalomalacia identified.  Mild left mastoid effusion has been present since a 2014 neck CT comparison. Right mastoids are clear. Paranasal sinuses are clear. Negative orbit and scalp soft tissues.  IMPRESSION: 1. No acute infarct or acute intracranial abnormality evident on this noncontrast exam. Early metastatic/lymphoma involvement of the brain cannot be excluded in the absence of intravenous contrast. 2. 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 3. Mild chronic left mastoid effusion, most often postinflammatory and significance doubtful.   Electronically Signed   By: Genevie Ann M.D.   On: 12/02/2014 16:45    Assessment:  Caleb Francis is a 69 y.o. male with relapsed cell lymphoma currently day 7 cycle #2 Rice chemotherapy. With acute altered mental status to metabolic encephalopathy from Klebsiella sepsis. He has a urinary tract infection (pyelonephritis).[name] His mental status has declined likely due to his hypernatremia.  Plan: 1. Hematology/Oncology: Counts drifting down secondary to recent chemotherapy and current infection.  Continue GCSF until [place] > 5000.  Currently approaching nadir counts.  Transfuse platelets if < 20k.  All blood products leukopoor and irradiated. 2. Infectious disease: No further + blood cultures.  Double GNR coverage.  Blood cultures q 24 hours prn temp > = 100.4. 3. Neurology:  Metabolic encephalopathy.  Hypernatremia on D5W.  Head MRI negative.  Appreciate neurology consult. 4. Fluids/electrolytes/nutrition- Await nephrology consultation.  Lequita Asal, MD

## 2014-12-04 NOTE — Progress Notes (Signed)
[facility]  ROUNDING NOTE   Subjective:  Pt well known to [place] from prior admission. 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 He is quite agitated this AM and was a bit aggressive when he was seen. This is despite precedex gtt.  He has severe hypernatremia and also has acute renal failure.    Objective:  Vital signs in last 24 hours:  Temp:  [97.5 F (36.4 C)-99.3 F (37.4 C)] 97.6 F (36.4 C) (06/22 0700) Pulse Rate:  [66-121] 75 (06/22 0800) Resp:  [21-42] 31 (06/22 0800) BP: (110-152)/(60-81) 126/68 mmHg (06/22 0800) SpO2:  [83 %-100 %] 100 % (06/22 0800)  Weight change:  Filed Weights   11/29/14 1222  Weight: 73.483 kg (162 lb)    Intake/Output: I/O last 3 completed shifts: In: 4367.1 [I.V.:2992.1; IV Piggyback:1375] Out: 1308 [Urine:1450]   Intake/Output this shift:     Physical Exam: General: agitated  Head: Normocephalic, atraumatic. Dry oral mucosal membranes  Eyes: Anicteric  Neck: Supple, trachea midline  Lungs:  Clear to auscultation normal effort  Heart: S1S2 tachycardic  Abdomen:  Soft, nontender, slight distension  Extremities: no peripheral edema.  Neurologic: Awake but agitated  Skin: No lesions  Access: none    Basic Metabolic Panel:  Recent Labs Lab 12/01/14 0436 12/02/14 0353 12/03/14 0437 12/03/14 0633 12/03/14 1226 12/04/14 0459  NA 146* 154* 160* 165*  --  163*  K 3.6 3.1* 2.9* 3.2* 3.2* 2.8*  CL 123* 126* >130* >130*  --  >130*  CO2 19* 18* 18* 17*  --  19*  GLUCOSE 173* 161* 191* 175*  --  182*  BUN 43* 56* 72* 69*  --  66*  CREATININE 1.91* 2.18* 2.61* 2.58*  --  2.45*  CALCIUM 8.4* 8.7* 8.3* 8.9  --  9.0  MG  --   --  2.6* 2.5*  --  2.4  PHOS  --   --  4.3 4.4  --  3.4    Liver Function Tests:  Recent Labs Lab 11/29/14 1009 11/29/14 1318 12/03/14 0633 12/03/14 2359  AST 44* 43*  --  20  ALT 45 42  --  17  ALKPHOS 283* 268*  --  141*  BILITOT 0.5 0.6  --  2.1*   PROT 6.7 6.4*  --  5.9*  ALBUMIN 2.4* 2.4* 2.1* 2.0*   No results for input(s): LIPASE, AMYLASE in the last 168 hours.  Recent Labs Lab 11/29/14 1318 12/03/14 2247  AMMONIA 31 53*    CBC:  Recent Labs Lab 11/28/14 0913 11/29/14 1318 11/30/14 0500  12/01/14 0436 12/02/14 0353 12/03/14 0437 12/03/14 0633 12/04/14 0839  WBC 11.9* 9.5 8.5  --  7.4 6.3 3.6* 3.7* 2.7*  NEUTROABS 10.5* 8.9*  --   --   --   --   --   --  1.6  HGB 7.1* 7.0* 6.6*  < > 8.2* 7.7* 6.8* 7.3* 7.2*  HCT 21.7* 21.9* 20.2*  < > 24.8* 23.8* 20.8* 22.4* 22.2*  MCV 84.8 86.5 84.9  --  85.7 86.2 86.1 87.8 87.0  PLT 284 288 276  --  191 123*  --  57* 24*  < > = values in this interval not displayed.  Cardiac Enzymes:  Recent Labs Lab 11/29/14 1318 11/29/14 1549 11/29/14 2121 11/30/14 0412  TROPONINI 0.06* 0.06* 0.05* 0.07*    BNP: Invalid input(s): POCBNP  CBG:  Recent Labs Lab 12/03/14 0528 12/03/14 1213 12/03/14 1727 12/03/14 2353 12/04/14 0537  [place]    Microbiology: Results for orders placed or performed during the hospital encounter of 11/29/14  MRSA PCR Screening     Status: None   Collection Time: 11/29/14 12:13 PM  Result Value Ref Range Status   MRSA by PCR NEGATIVE NEGATIVE Final    Comment:        The GeneXpert MRSA Assay (FDA approved for NASAL specimens only), is one component of a comprehensive MRSA colonization surveillance program. It is not intended to diagnose MRSA infection nor to guide or monitor treatment for MRSA infections.   Culture, blood (routine x 2)     Status: None   Collection Time: 11/29/14  1:19 PM  Result Value Ref Range Status   Specimen Description BLOOD  Final   Special Requests Immunocompromised  Final   Culture NO GROWTH 5 DAYS  Final   Report Status 12/04/2014 FINAL  Final  Urine culture     Status: None   Collection Time: 11/30/14 10:12 AM  Result Value Ref Range Status   Specimen Description URINE,  CATHETERIZED  Final   Special Requests Immunocompromised  Final   Culture NO GROWTH 2 DAYS  Final   Report Status 12/02/2014 FINAL  Final    Coagulation Studies: No results for input(s): LABPROT, INR in the last 72 hours.  Urinalysis: No results for input(s): COLORURINE, LABSPEC, PHURINE, GLUCOSEU, HGBUR, BILIRUBINUR, KETONESUR, PROTEINUR, UROBILINOGEN, NITRITE, LEUKOCYTESUR in the last 72 hours.  Invalid input(s): APPERANCEUR    Imaging: Mr Herby Abraham Contrast  12/02/2014   CLINICAL DATA:  69 year old male with altered mental status, recent sepsis and urinary tract infection. Undergoing chemotherapy for mantle cell lymphoma. Initial encounter.  EXAM: MRI HEAD WITHOUT CONTRAST  TECHNIQUE: Multiplanar, multiecho pulse sequences of the brain and surrounding structures were obtained without intravenous contrast.  COMPARISON:  Head CTs without contrast 11/29/2014 and earlier.  FINDINGS: Study is intermittently degraded by motion artifact despite repeated imaging attempts.  Cerebral volume is within normal limits for age. No restricted diffusion to suggest acute infarction. No midline shift, mass effect, evidence of mass lesion, ventriculomegaly, extra-axial collection or acute intracranial hemorrhage. Cervicomedullary junction and pituitary are within normal limits. Major intracranial vascular flow voids are grossly preserved at the skullbase.  Minimal to mild for age nonspecific cerebral white matter signal changes. 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 No other encephalomalacia identified.  Mild left mastoid effusion has been present since a 2014 neck CT comparison. Right mastoids are clear. Paranasal sinuses are clear. Negative orbit and scalp soft tissues.  IMPRESSION: 1. No acute infarct or acute intracranial abnormality evident on this noncontrast exam. Early metastatic/lymphoma involvement of the brain cannot be excluded in the absence of intravenous contrast. 2.  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 3. Mild chronic left mastoid effusion, most often postinflammatory and significance doubtful.   Electronically Signed   By: Genevie Ann M.D.   On: 12/02/2014 16:45     Medications:   . dexmedetomidine 0.849 mcg/kg/hr (12/04/14 0911)  . dextrose     . antiseptic oral rinse  7 mL Mouth Rinse BID  . aspirin  324 mg Oral Once  . cefTRIAXone (ROCEPHIN)  IV  2 g Intravenous Q24H  . diazepam  5 mg Intravenous Q6H  . filgrastim  480 mcg Subcutaneous Once  . potassium chloride  10 mEq Intravenous Q1 Hr x 4  . sodium chloride  3 mL Intravenous Q12H  . thiamine IV  100 mg Intravenous Daily   acetaminophen **OR** acetaminophen, haloperidol lactate, magnesium hydroxide, morphine injection  Assessment/ Plan:  59 AAM with progressive mantle cell lymphoma, hx left sided hydronephrosis and hydroureter. S/p ureteral stent placement    1.  Acute renal failure due to ATN. 2.  Sepsis with klebsiella oxytoca. 3.  Hypernatremia. 4.  Hypokalmeia.  Plan:  Pt well known to [place] from prior admission.  Had recent CT scan abd/pelvis which showed hydronephrosis improved. Therefore acute renal failure now due to ATN from sepsis.  Hypernatermia noted, will increase D5W to 175cc/hr.  In addition Agree with giving KCL 28meq in total today and following up serum K.  Palliative care alos on the case.  Pt quite agitated this AM  Despite use of precedex.  Will continue to monitor progress with you.  Thanks for consult.    LOS: 5 Kimberle Stanfill 6/22/201610:25 AM

## 2014-12-04 NOTE — Progress Notes (Signed)
Pt currently resting, no visible signs of SOB or pain.  Has episodes of cooperation and following commands, followed by periods of aggression.  Precedex drip stopped.  Plan is to monitor one more day and possibly move to the floor tomorrow.

## 2014-12-04 NOTE — Progress Notes (Signed)
Dr. Jannifer Franklin is notified about patient critical lab values, NA 163, CH higher than 130, K 2.8. New order received to replace K. Continue to monitor the patient.

## 2014-12-04 NOTE — Care Management Note (Signed)
Case Management Note  Patient Details  Name: Caleb Francis MRN: [id number] Date of Birth: 1946-01-06  Subjective/Objective:  Discussed LTACH with Dr. Ermalinda Memos. She feels patient is to sick a this time. WiIl continue to follow.                   Action/Plan:   Expected Discharge Date:                  Expected Discharge Plan:  Skilled Nursing Facility  In-House Referral:  Clinical Social Work  Discharge planning Services     Post Acute Care Choice:    Choice offered to:     DME Arranged:    DME Agency:     HH Arranged:    [place] Agency:     Status of Service:     Medicare Important Message Given:  Yes Date Medicare IM Given:  12/03/14 Medicare IM give by:  Orvan July Date Additional Medicare IM Given:    Additional Medicare Important Message give by:     If discussed at [place] of Stay Meetings, dates discussed:    Additional Comments:  Jolly Mango, RN 12/04/2014, 2:58 PM

## 2014-12-05 ENCOUNTER — Inpatient Hospital Stay: Payer: Medicare Other

## 2014-12-05 LAB — BASIC METABOLIC PANEL
BUN: 57 mg/dL — ABNORMAL HIGH (ref 6–20)
BUN: 48 mg/dL — AB (ref 6–20)
CALCIUM: 8.9 mg/dL (ref 8.9–10.3)
CO2: 18 mmol/L — ABNORMAL LOW (ref 22–32)
CO2: 19 mmol/L — ABNORMAL LOW (ref 22–32)
CREATININE: 2.37 mg/dL — AB (ref 0.61–1.24)
Calcium: 9.1 mg/dL (ref 8.9–10.3)
Chloride: >130 mmol/L (ref 101–111)
Creatinine, Ser: 2.13 mg/dL — ABNORMAL HIGH (ref 0.61–1.24)
GFR calc Af Amer: 35 mL/min — ABNORMAL LOW (ref >60)
GFR calc non Af Amer: 27 mL/min — ABNORMAL LOW (ref >60)
GFR calc non Af Amer: 30 mL/min — ABNORMAL LOW (ref >60)
GFR, EST AFRICAN AMERICAN: 31 mL/min — AB (ref >60)
Glucose, Bld: 150 mg/dL — ABNORMAL HIGH (ref 65–99)
Glucose, Bld: 146 mg/dL — ABNORMAL HIGH (ref 65–99)
POTASSIUM: 3.2 mmol/L — AB (ref 3.5–5.1)
Potassium: 3.7 mmol/L (ref 3.5–5.1)
Sodium: 164 mmol/L (ref 135–145)
Sodium: 158 mmol/L — ABNORMAL HIGH (ref 135–145)

## 2014-12-05 LAB — CBC
HCT: 23.2 % — ABNORMAL LOW (ref 40.0–52.0)
HEMOGLOBIN: 7.5 g/dL — AB (ref 13.0–18.0)
MCH: 28.1 pg (ref 26.0–34.0)
MCHC: 32.4 g/dL (ref 32.0–36.0)
MCV: 86.8 fL (ref 80.0–100.0)
PLATELETS: 13 10*3/uL — AB (ref 150–440)
RBC: 2.68 MIL/uL — ABNORMAL LOW (ref 4.40–5.90)
RDW: 20.1 % — AB (ref 11.5–14.5)
WBC: 1.7 10*3/uL — ABNORMAL LOW (ref 3.8–10.6)

## 2014-12-05 LAB — GLUCOSE, CAPILLARY
Glucose-Capillary: 159 mg/dL — ABNORMAL HIGH (ref 65–99)
Glucose-Capillary: 172 mg/dL — ABNORMAL HIGH (ref 65–99)

## 2014-12-05 LAB — MAGNESIUM: Magnesium: 2.1 mg/dL (ref 1.7–2.4)

## 2014-12-05 LAB — PHOSPHORUS: Phosphorus: 2.5 mg/dL (ref 2.5–4.6)

## 2014-12-05 LAB — HIGH SENSITIVITY CRP: CRP HIGH SENSITIVITY: 212.94 mg/L — AB (ref 0.00–3.00)

## 2014-12-05 MED ORDER — LACTULOSE ENEMA
300.0000 mL | Freq: Every day | ORAL | Status: DC
  Filled 2014-12-05: qty 300

## 2014-12-05 MED ORDER — JEVITY 1.5 CAL/FIBER PO LIQD: 1000.0000 mL | ORAL | Status: DC

## 2014-12-05 MED ORDER — DEXMEDETOMIDINE HCL IN NACL 400 MCG/100ML IV SOLN
0.4000 ug/kg/h | INTRAVENOUS | Status: DC
  Administered 2014-12-05: 0.6 ug/kg/h via INTRAVENOUS
  Administered 2014-12-05: 0.4 ug/kg/h via INTRAVENOUS
  Filled 2014-12-05 (×2): qty 100

## 2014-12-05 MED ORDER — FREE WATER: 25.0000 mL | Status: DC

## 2014-12-05 MED ORDER — PANTOPRAZOLE SODIUM 40 MG PO TBEC: 40.0000 mg | DELAYED_RELEASE_TABLET | Freq: Every day | ORAL | Status: DC

## 2014-12-05 MED ORDER — ACETAMINOPHEN 325 MG PO TABS
650.0000 mg | ORAL_TABLET | Freq: Once | ORAL | Status: DC
  Filled 2014-12-05: qty 2

## 2014-12-05 MED ORDER — HEPARIN SOD (PORK) LOCK FLUSH 100 UNIT/ML IV SOLN
500.0000 [IU] | Freq: Every day | INTRAVENOUS | Status: DC | PRN
  Filled 2014-12-05: qty 5

## 2014-12-05 MED ORDER — HEPARIN SOD (PORK) LOCK FLUSH 100 UNIT/ML IV SOLN
250.0000 [IU] | INTRAVENOUS | Status: DC | PRN
  Filled 2014-12-05: qty 3

## 2014-12-05 MED ORDER — TRACE MINERALS CR-CU-MN-SE-ZN 10-1000-500-60 MCG/ML IV SOLN
INTRAVENOUS | Status: DC
  Filled 2014-12-05: qty 600

## 2014-12-05 MED ORDER — ZIPRASIDONE MESYLATE 20 MG IM SOLR
10.0000 mg | Freq: Every day | INTRAMUSCULAR | Status: DC
  Administered 2014-12-05 – 2015-01-01 (×23): 10 mg via INTRAMUSCULAR
  Filled 2014-12-05 (×30): qty 20

## 2014-12-05 MED ORDER — DIAZEPAM 5 MG/ML IJ SOLN
5.0000 mg | INTRAMUSCULAR | Status: DC
  Administered 2014-12-05 – 2014-12-06 (×5): 5 mg via INTRAVENOUS
  Filled 2014-12-05 (×3): qty 2

## 2014-12-05 MED ORDER — CLOPIDOGREL BISULFATE 75 MG PO TABS: 300.0000 mg | ORAL_TABLET | Freq: Every day | ORAL | Status: DC

## 2014-12-05 MED ORDER — LACTULOSE 10 GM/15ML PO SOLN
30.0000 g | Freq: Two times a day (BID) | ORAL | Status: DC
  Administered 2014-12-05 – 2014-12-15 (×19): 30 g via ORAL
  Filled 2014-12-05 (×6): qty 60
  Filled 2014-12-05: qty 30
  Filled 2014-12-05 (×12): qty 60
  Filled 2014-12-05: qty 30

## 2014-12-05 MED ORDER — SODIUM CHLORIDE 0.9 % IJ SOLN: 10.0000 mL | INTRAMUSCULAR | Status: DC | PRN

## 2014-12-05 MED ORDER — DIPHENHYDRAMINE HCL 50 MG/ML IJ SOLN
25.0000 mg | Freq: Once | INTRAMUSCULAR | Status: AC
  Administered 2014-12-05: 25 mg via INTRAVENOUS
  Filled 2014-12-05: qty 1

## 2014-12-05 MED ORDER — ZIPRASIDONE MESYLATE 20 MG IM SOLR
10.0000 mg | Freq: Two times a day (BID) | INTRAMUSCULAR | Status: DC | PRN
  Administered 2014-12-05: 10 mg via INTRAMUSCULAR
  Filled 2014-12-05 (×3): qty 20

## 2014-12-05 MED ORDER — SODIUM CHLORIDE 0.9 % IJ SOLN: 3.0000 mL | INTRAMUSCULAR | Status: DC | PRN

## 2014-12-05 MED ORDER — POTASSIUM CHLORIDE 2 MEQ/ML IV SOLN
Freq: Once | INTRAVENOUS | Status: AC
  Administered 2014-12-05: 11:00:00 via INTRAVENOUS
  Filled 2014-12-05: qty 20

## 2014-12-05 MED ORDER — INSULIN ASPART 100 UNIT/ML ~~LOC~~ SOLN
1.0000 [IU] | Freq: Four times a day (QID) | SUBCUTANEOUS | Status: DC
  Administered 2014-12-05 (×2): 2 [IU] via SUBCUTANEOUS
  Administered 2014-12-06 (×2): 100 [IU] via SUBCUTANEOUS
  Administered 2014-12-06: 1 [IU] via SUBCUTANEOUS
  Administered 2014-12-07 (×3): 3 [IU] via SUBCUTANEOUS
  Administered 2014-12-07 – 2014-12-08 (×3): 2 [IU] via SUBCUTANEOUS
  Administered 2014-12-08: 3 [IU] via SUBCUTANEOUS
  Administered 2014-12-08: 2 [IU] via SUBCUTANEOUS
  Administered 2014-12-09 (×2): 1 [IU] via SUBCUTANEOUS
  Administered 2014-12-09: 2 [IU] via SUBCUTANEOUS
  Administered 2014-12-09: 1 [IU] via SUBCUTANEOUS
  Administered 2014-12-10 (×2): 2 [IU] via SUBCUTANEOUS
  Administered 2014-12-11 (×2): 1 [IU] via SUBCUTANEOUS
  Administered 2014-12-11: 2 [IU] via SUBCUTANEOUS
  Filled 2014-12-05: qty 3
  Filled 2014-12-05 (×2): qty 1
  Filled 2014-12-05: qty 2
  Filled 2014-12-05: qty 1
  Filled 2014-12-05 (×2): qty 2
  Filled 2014-12-05: qty 1
  Filled 2014-12-05: qty 3
  Filled 2014-12-05: qty 2
  Filled 2014-12-05: qty 1
  Filled 2014-12-05: qty 3
  Filled 2014-12-05 (×2): qty 2
  Filled 2014-12-05: qty 1

## 2014-12-05 MED ORDER — SODIUM CHLORIDE 0.9 % IV SOLN
250.0000 mL | Freq: Once | INTRAVENOUS | Status: AC
  Administered 2014-12-05: 250 mL via INTRAVENOUS

## 2014-12-05 NOTE — Progress Notes (Signed)
MD was notified about critical value platelets 13. Patient currently does not have any signs of bleeding. No new orders received. Continue to monitor the patient.

## 2014-12-05 NOTE — Clinical Social Work Note (Signed)
CSW continuing to follow and will facilitate discharge back to [facility] when time. Shela Leff MSW, LCSWA

## 2014-12-05 NOTE — Progress Notes (Signed)
[facility]  ROUNDING NOTE   Subjective:  Pt well known to [place] from prior admission. 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 He is agitated this AM and was placed on precedex drip overnight Nurse reports he askes for urinal to void He has severe hypernatremia and also has acute renal failure.    Objective:  Vital signs in last 24 hours:  Temp:  [97.5 F (36.4 C)-97.7 F (36.5 C)] 97.6 F (36.4 C) (06/23 0316) Pulse Rate:  [75-131] 95 (06/23 0600) Resp:  [14-31] 23 (06/23 0600) BP: (107-137)/(61-92) 110/65 mmHg (06/23 0600) SpO2:  [98 %-100 %] 100 % (06/23 0600)  Weight change:  Filed Weights   11/29/14 1222  Weight: 73.483 kg (162 lb)    Intake/Output: I/O last 3 completed shifts: In: 2713 [I.V.:2363; IV Piggyback:350] Out: 4268 [Urine:1225]   Intake/Output this shift:     Physical Exam: General: agitated  Head: Normocephalic, atraumatic. Dry oral mucosal membranes  Eyes: Anicteric  Neck: Supple, trachea midline  Lungs:  Clear to auscultation normal effort  Heart: S1S2 tachycardic  Abdomen:  Soft, nontender, slight distension  Extremities: no peripheral edema.  Neurologic: Awake but agitated  Skin: No lesions  Access: none    Basic Metabolic Panel:  Recent Labs Lab 12/02/14 0353 12/03/14 0437 12/03/14 0633 12/03/14 1226 12/04/14 0459 12/04/14 2000 12/05/14 0530  NA 154* 160* 165*  --  163*  --  PENDING  K 3.1* 2.9* 3.2* 3.2* 2.8* 3.1* 3.2*  CL 126* >130* >130*  --  >130*  --  >130*  CO2 18* 18* 17*  --  19*  --  18*  GLUCOSE 161* 191* 175*  --  182*  --  150*  BUN 56* 72* 69*  --  66*  --  57*  CREATININE 2.18* 2.61* 2.58*  --  2.45*  --  2.37*  CALCIUM 8.7* 8.3* 8.9  --  9.0  --  9.1  MG  --  2.6* 2.5*  --  2.4  --   --   PHOS  --  4.3 4.4  --  3.4  --   --     Liver Function Tests:  Recent Labs Lab 11/29/14 1009 11/29/14 1318 12/03/14 0633 12/03/14 2359  AST 44* 43*  --  20  ALT 45 42  --   17  ALKPHOS 283* 268*  --  141*  BILITOT 0.5 0.6  --  2.1*  PROT 6.7 6.4*  --  5.9*  ALBUMIN 2.4* 2.4* 2.1* 2.0*   No results for input(s): LIPASE, AMYLASE in the last 168 hours.  Recent Labs Lab 11/29/14 1318 12/03/14 2247  AMMONIA 31 53*    CBC:  Recent Labs Lab 11/28/14 0913 11/29/14 1318  12/01/14 0436 12/02/14 0353 12/03/14 0437 12/03/14 0633 12/04/14 0839 12/05/14 0530  WBC 11.9* 9.5  < > 7.4 6.3 3.6* 3.7* 2.7* 1.7*  NEUTROABS 10.5* 8.9*  --   --   --   --   --  1.6  --   HGB 7.1* 7.0*  < > 8.2* 7.7* 6.8* 7.3* 7.2* 7.5*  HCT 21.7* 21.9*  < > 24.8* 23.8* 20.8* 22.4* 22.2* 23.2*  MCV 84.8 86.5  < > 85.7 86.2 86.1 87.8 87.0 86.8  PLT 284 288  < > 191 123*  --  57* 24* PENDING  < > = values in this interval not displayed.  Cardiac Enzymes:  Recent Labs Lab 11/29/14 1318 11/29/14 1549 11/29/14 2121 11/30/14 3419  TROPONINI 0.06* 0.06* 0.05* 0.07*    BNP: Invalid input(s): POCBNP  CBG:  Recent Labs Lab 12/03/14 1727 12/03/14 2353 12/04/14 0537 12/04/14 1226 12/04/14 1821  GLUCAP 140* 169* 167* 145* 162*    Microbiology: Results for orders placed or performed during the hospital encounter of 11/29/14  MRSA PCR Screening     Status: None   Collection Time: 11/29/14 12:13 PM  Result Value Ref Range Status   MRSA by PCR NEGATIVE NEGATIVE Final    Comment:        The GeneXpert MRSA Assay (FDA approved for NASAL specimens only), is one component of a comprehensive MRSA colonization surveillance program. It is not intended to diagnose MRSA infection nor to guide or monitor treatment for MRSA infections.   Culture, blood (routine x 2)     Status: None   Collection Time: 11/29/14  1:19 PM  Result Value Ref Range Status   Specimen Description BLOOD  Final   Special Requests Immunocompromised  Final   Culture NO GROWTH 5 DAYS  Final   Report Status 12/04/2014 FINAL  Final  Urine culture     Status: None   Collection Time: 11/30/14 10:12 AM   Result Value Ref Range Status   Specimen Description URINE, CATHETERIZED  Final   Special Requests Immunocompromised  Final   Culture NO GROWTH 2 DAYS  Final   Report Status 12/02/2014 FINAL  Final    Coagulation Studies: No results for input(s): LABPROT, INR in the last 72 hours.  Urinalysis: No results for input(s): COLORURINE, LABSPEC, PHURINE, GLUCOSEU, HGBUR, BILIRUBINUR, KETONESUR, PROTEINUR, UROBILINOGEN, NITRITE, LEUKOCYTESUR in the last 72 hours.  Invalid input(s): APPERANCEUR    Imaging: No results found.   Medications:   . dexmedetomidine 0.5 mcg/kg/hr (12/05/14 0548)  . dextrose 5 % with KCl Pediatric custom IV fluid 100 mL/hr at 12/05/14 0316   . antiseptic oral rinse  7 mL Mouth Rinse BID  . aspirin  324 mg Oral Once  . cefTRIAXone (ROCEPHIN)  IV  2 g Intravenous Q24H  . diazepam  5 mg Intravenous Q6H  . filgrastim  480 mcg Subcutaneous Once  . filgrastim  480 mcg Subcutaneous Daily  . sodium chloride  3 mL Intravenous Q12H  . thiamine IV  100 mg Intravenous Daily   acetaminophen **OR** acetaminophen, haloperidol lactate, magnesium hydroxide, morphine injection  Assessment/ Plan:  61 AAM with progressive mantle cell lymphoma, hx left sided hydronephrosis and hydroureter. S/p ureteral stent placement    1.  Acute renal failure due to ATN. 2.  Sepsis with klebsiella oxytoca. 3.  Hypernatremia. 4.  Hypokalemia.  Plan:  Pt well known to [place] from prior admission.  Had recent CT scan abd/pelvis which showed hydronephrosis improved. Therefore acute renal failure now due to ATN from sepsis.  Hypernatermia noted, will increase D5W to 150 cc/hr.  In addition agree with aggressive Kcl replacement. Check Labs q 8 hrs to avoid over correction of sodium. Goal of correction is 8 meq in 24 hr period on an average .    LOS: 6 Caleb Francis 6/23/20167:30 AM

## 2014-12-05 NOTE — Progress Notes (Signed)
Neurology Note  S: Pt agitated again today especially this morning.  Pt was up all last night again.    ROS unobtainable secondary to mental status  O: AFVSS NL weight, no acute distress Normocephalic, oropharynx clear CTA B, no wheezing RRR, no murmur No C/C/E  Lethargic, agitated, does not follow PERRLA, EOMI, face symmetric Moves all ext equally  A/P: 1. Encephalopathy- worsened today;  Likely a component of ICU psychosis as well.  Pt also has complicating nutritional deficit and electrolyte abnormalities as well as infection. -  Add Geodon 10mg  qHS regardless - Start lactulose 30gm BID - Continue thiamine 100mg  IV daily - PRN Geodon 10mg  q12h PRN agitation - ** Lights on during day and up in chair ** - Needs nutrition which would be better through NG tube - Will follow

## 2014-12-05 NOTE — Progress Notes (Signed)
Initial Nutrition Assessment    INTERVENTION:   Coordination of Care: discussed nutritional poc during ICU rounds; attempted NG yesterday unsuccessful (pt became agitated, elevated heart rate, labored breathing); no NG placed. No plans to attempt further. Pt with portacath. Remains NPO, unable to advance diet due to cognition. Plan to start TPN today per MD Kasa PN: recommend starting 5%AA/15%Dextrose at low rate of 25 ml/hr due to multiple abnormal lab values, currently receiving 612 kcals from D5 infusion due to free water deficit. Goal rate of TPN with current D5 infusion is 70 ml/hr providing 84 g of protein, 1192 kcals (additional kcals from D5). Once off D5 infusion, recommend changing to 4.25%AA/25% Dextrose at rate of 75 ml/hr providing 77 g of protein, 1838 kcals. Pharmacy consulted to assist with electrolyte and glucose management, daily weights.  NUTRITION DIAGNOSIS:  Inadequate oral intake related to inability to eat as evidenced by NPO status. Being addressed via TPN  GOAL:  PN: tolerance Energy Intake: diet advancement as soon as medically able  MONITOR:   (PN, Energy Intake, Anthropometrics, Digestive System, Electrolyte/Renal Profile, Glucose Profile, I/O)  REASON FOR ASSESSMENT:  Consult New TPN/TNA  ASSESSMENT:  Pt continues with AMS, back on precedex during the night  Diet Order: NPO day 7 including day of admission  Digestive System: attempted NG placement yesterday; pt became agitated with elevated heart rate, labored breathing; NG placement unsuccessful   Recent Labs Lab 12/03/14 0437 12/03/14 0633  12/04/14 0459 12/04/14 2000 12/05/14 0530  NA 160* 165*  --  163*  --  164*  K 2.9* 3.2*  < > 2.8* 3.1* 3.2*  CL >130* >130*  --  >130*  --  >130*  CO2 18* 17*  --  19*  --  18*  BUN 72* 69*  --  66*  --  57*  CREATININE 2.61* 2.58*  --  2.45*  --  2.37*  CALCIUM 8.3* 8.9  --  9.0  --  9.1  MG 2.6* 2.5*  --  2.4  --   --   PHOS 4.3 4.4  --  3.4  --    --   GLUCOSE 191* 175*  --  182*  --  150*  < > = values in this interval not displayed.  Protein Profile:  Recent Labs Lab 11/29/14 1318 12/03/14 0633 12/03/14 2359  ALBUMIN 2.4* 2.1* 2.0*   Nutritional Anemia Profile:  CBC Latest Ref Rng 12/05/2014 12/04/2014 12/03/2014  WBC 3.8 - 10.6 K/uL 1.7(L) 2.7(L) 3.7(L)  Hemoglobin 13.0 - 18.0 g/dL 7.5(L) 7.2(L) 7.3(L)  Hematocrit 40.0 - 52.0 % 23.2(L) 22.2(L) 22.4(L)  Platelets 150 - 440 K/uL 13(LL) 24(LL) 57(L)    Height:  Ht Readings from Last 1 Encounters:  11/29/14 6' (1.829 m)    Weight:  Wt Readings from Last 1 Encounters:  11/29/14 162 lb (73.483 kg)    Filed Weights   11/29/14 1222  Weight: 162 lb (73.483 kg)     BMI:  Body mass index is 21.97 kg/(m^2).  Estimated Nutritional Needs:  Kcal:  1994-2357kcals, BEE: 1511kcals, TEE: IF 1.1-1.3)(AF 1.2)  Protein:  74-88g protein (1.0-1.2g/kg)  Fluid:  1838-2221mL of fluid (25-75mL/kg)     EDUCATION NEEDS:  No education needs identified at this time   Intake/Output Summary (Last 24 hours) at 12/05/14 1323 Last data filed at 12/05/14 0600  Gross per 24 hour  Intake 1236.67 ml  Output    675 ml  Net 561.67 ml    HIGH Care Level  Kerman Passey Guadal[place], [place], LDN [phone] Pager

## 2014-12-05 NOTE — Progress Notes (Signed)
[place] at [facility]                                                                                                                                                                                            Patient Demographics   Caleb Francis, is a 69 y.o. male, DOB - 1946/05/17, PJA:[id number]  Admit date - 11/29/2014   Admitting Physician Aldean Jewett, MD  Outpatient Primary MD for the patient is No PCP Per Patient   Subjective: Pt. Lying in bed w/ son at bedside. A bit more awake today.    Review of Systems:   Unable to provide as pt. Is Encephalopathic.   Vitals:   Filed Vitals:   12/05/14 0316 12/05/14 0400 12/05/14 0500 12/05/14 0600  BP:  119/87 131/79 110/65  Pulse:  123 113 95  Temp: 97.6 F (36.4 C)     TempSrc: Oral     Resp:  16 24 23   Height:      Weight:      SpO2:  100% 100% 100%    Wt Readings from Last 3 Encounters:  11/29/14 73.483 kg (162 lb)  11/27/14 68.2 kg (150 lb 5.7 oz)  11/25/14 70.761 kg (156 lb)     Intake/Output Summary (Last 24 hours) at 12/05/14 1207 Last data filed at 12/05/14 0600  Gross per 24 hour  Intake 1236.67 ml  Output    675 ml  Net 561.67 ml    Physical Exam:   GENERAL:  pt. Lying in bed in NAD.   HEAD, EYES, EARS, NOSE AND THROAT: Atraumatic, normocephalic. Extraocular muscles are intact. Pupils equal and reactive to light. Sclerae anicteric. No conjunctival injection. No oro-pharyngeal erythema. Moist oral Mucosa.  NECK: Supple. There is no jugular venous distention. No bruits, no lymphadenopathy, no thyromegaly.  HEART:  tachycardic. No murmurs, no rubs, no clicks.  LUNGS: Clear to auscultation bilaterally. No rales, wheezes, rhonchi.    ABDOMEN: Soft, flat, nontender, nondistended. Has good bowel sounds. No hepatosplenomegaly appreciated.  EXTREMITIES: No evidence of any cyanosis, clubbing, or peripheral edema.  +2 pedal and radial pulses bilaterally.  NEUROLOGIC:  Encephalopathic but moves all extremities spontaneously. No focal motor or sensory deficits. SKIN: Moist and warm with no rashes appreciated.  Psych:  Encephalopathic, agitated at times.      Antibiotics   Anti-infectives    Start     Dose/Rate Route Frequency Ordered Stop   12/01/14 0945  piperacillin-tazobactam (ZOSYN) IVPB 3.375 g  Status:  Discontinued     3.375 g 12.5 mL/hr over 240 Minutes Intravenous 3 times per day 12/01/14 0936  12/03/14 1337   11/30/14 1000  cefTRIAXone (ROCEPHIN) 2 g in dextrose 5 % 50 mL IVPB - Premix    Comments:  Entered per MD progress note   2 g 100 mL/hr over 30 Minutes Intravenous Every 24 hours 11/29/14 2157     11/29/14 1415  cefTRIAXone (ROCEPHIN) 1 g in dextrose 5 % 50 mL IVPB - Premix     1 g 100 mL/hr over 30 Minutes Intravenous  Once 11/29/14 1406 11/29/14 1452   11/29/14 1414  cefTRIAXone (ROCEPHIN) 20 MG/ML IVPB 50 mL    Comments:  MONAR, NELLIE: cabinet override      11/29/14 1414 11/29/14 1424      Medications   Scheduled Meds: . sodium chloride  250 mL Intravenous Once  . acetaminophen  650 mg Oral Once  . antiseptic oral rinse  7 mL Mouth Rinse BID  . aspirin  324 mg Oral Once  . cefTRIAXone (ROCEPHIN)  IV  2 g Intravenous Q24H  . diazepam  5 mg Intravenous Q4H  . filgrastim  480 mcg Subcutaneous Once  . filgrastim  480 mcg Subcutaneous Daily  . sodium chloride  3 mL Intravenous Q12H  . thiamine IV  100 mg Intravenous Daily   Continuous Infusions: . dextrose 5 % with KCl Pediatric custom IV fluid 150 mL/hr at 12/05/14 1011   PRN Meds:.acetaminophen **OR** acetaminophen, haloperidol lactate, heparin lock flush, heparin lock flush, magnesium hydroxide, morphine injection, sodium chloride, sodium chloride, ziprasidone   Data Review:   Micro Results Recent Results (from the past 240 hour(s))  Culture, blood (single)     Status: None   Collection Time: 11/29/14 11:09 AM  Result Value Ref Range Status   Specimen Description  BLOOD  Final   Special Requests NONE  Final   Culture  Setup Time   Final    GRAM NEGATIVE RODS AEROBIC BOTTLE ONLY CRITICAL RESULT CALLED TO, READ BACK BY AND VERIFIED WITH: SANDRA VORBA ON 11/30/14 AT 0825 BY JEF    Culture KLEBSIELLA OXYTOCA AEROBIC BOTTLE ONLY   Final   Report Status 12/04/2014 FINAL  Final   Organism ID, Bacteria KLEBSIELLA OXYTOCA  Final      Susceptibility   Klebsiella oxytoca - MIC*    AMPICILLIN >=32 RESISTANT Resistant     CEFTAZIDIME <=1 SENSITIVE Sensitive     CEFAZOLIN >=64 RESISTANT Resistant     CEFTRIAXONE <=1 SENSITIVE Sensitive     CIPROFLOXACIN <=0.25 SENSITIVE Sensitive     GENTAMICIN <=1 SENSITIVE Sensitive     IMIPENEM <=0.25 SENSITIVE Sensitive     TRIMETH/SULFA <=20 SENSITIVE Sensitive     CEFOXITIN <=4 SENSITIVE Sensitive     * KLEBSIELLA OXYTOCA  Urine culture     Status: None   Collection Time: 11/29/14 11:22 AM  Result Value Ref Range Status   Specimen Description URINE, CLEAN CATCH  Final   Special Requests NONE  Final   Culture >=100,000 COLONIES/mL KLEBSIELLA OXYTOCA  Final   Report Status 12/01/2014 FINAL  Final   Organism ID, Bacteria KLEBSIELLA OXYTOCA  Final      Susceptibility   Klebsiella oxytoca - MIC*    AMPICILLIN >=32 RESISTANT Resistant     CEFAZOLIN >=64 RESISTANT Resistant     CEFTRIAXONE 4 SENSITIVE Sensitive     CIPROFLOXACIN <=0.25 SENSITIVE Sensitive     GENTAMICIN <=1 SENSITIVE Sensitive     IMIPENEM <=0.25 SENSITIVE Sensitive     NITROFURANTOIN <=16 SENSITIVE Sensitive     TRIMETH/SULFA <=20 SENSITIVE Sensitive  CEFOXITIN <=4 SENSITIVE Sensitive     * >=100,000 COLONIES/mL KLEBSIELLA OXYTOCA  MRSA PCR Screening     Status: None   Collection Time: 11/29/14 12:13 PM  Result Value Ref Range Status   MRSA by PCR NEGATIVE NEGATIVE Final    Comment:        The GeneXpert MRSA Assay (FDA approved for NASAL specimens only), is one component of a comprehensive MRSA colonization surveillance program. It is  not intended to diagnose MRSA infection nor to guide or monitor treatment for MRSA infections.   Culture, blood (routine x 2)     Status: None   Collection Time: 11/29/14  1:19 PM  Result Value Ref Range Status   Specimen Description BLOOD  Final   Special Requests Immunocompromised  Final   Culture NO GROWTH 5 DAYS  Final   Report Status 12/04/2014 FINAL  Final  Urine culture     Status: None   Collection Time: 11/30/14 10:12 AM  Result Value Ref Range Status   Specimen Description URINE, CATHETERIZED  Final   Special Requests Immunocompromised  Final   Culture NO GROWTH 2 DAYS  Final   Report Status 12/02/2014 FINAL  Final    Radiology Reports Ct Head Wo Contrast  11/29/2014   CLINICAL DATA:  Concern for chemotherapy adverse reaction. Patient transfer from cancer center. Altered mental status.  EXAM: CT HEAD WITHOUT CONTRAST  TECHNIQUE: Contiguous axial images were obtained from the base of the skull through the vertex without intravenous contrast.  COMPARISON:  Head CT 11/09/2014  FINDINGS: No acute intracranial hemorrhage. No focal mass lesion. No CT evidence of acute infarction. No midline shift or mass effect. No hydrocephalus. Basilar cisterns are patent.  There is mild generalized cortical atrophy and proportional ventricular dilatation. Mild periventricular subcortical white matter hypodensities.  Paranasal sinuses and  mastoid air cells are clear.  IMPRESSION: 1. No acute intracranial findings.  No change from prior. 2. Mild atrophy and white matter microvascular disease.   Electronically Signed   By: Suzy Bouchard M.D.   On: 11/29/2014 13:48   Ct Head Wo Contrast  11/09/2014   CLINICAL DATA:  Degenerating mental status, sepsis and history of progressive mantle cell lymphoma.  EXAM: CT HEAD WITHOUT CONTRAST  TECHNIQUE: Contiguous axial images were obtained from the base of the skull through the vertex without intravenous contrast.  COMPARISON:  05/21/2009  FINDINGS: No  findings by unenhanced head CT to suggest brain involvement by lymphoma. The brain demonstrates no evidence of hemorrhage, infarction, edema, mass effect, extra-axial fluid collection, hydrocephalus or mass lesion. The skull is unremarkable.  IMPRESSION: Unremarkable head CT.   Electronically Signed   By: Aletta Edouard M.D.   On: 11/09/2014 13:30   Mr Brain Wo Contrast  12/02/2014   CLINICAL DATA:  69 year old male with altered mental status, recent sepsis and urinary tract infection. Undergoing chemotherapy for mantle cell lymphoma. Initial encounter.  EXAM: MRI HEAD WITHOUT CONTRAST  TECHNIQUE: Multiplanar, multiecho pulse sequences of the brain and surrounding structures were obtained without intravenous contrast.  COMPARISON:  Head CTs without contrast 11/29/2014 and earlier.  FINDINGS: Study is intermittently degraded by motion artifact despite repeated imaging attempts.  Cerebral volume is within normal limits for age. No restricted diffusion to suggest acute infarction. No midline shift, mass effect, evidence of mass lesion, ventriculomegaly, extra-axial collection or acute intracranial hemorrhage. Cervicomedullary junction and pituitary are within normal limits. Major intracranial vascular flow voids are grossly preserved at the skullbase.  Minimal to  mild for age nonspecific cerebral white matter signal changes. 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 No other encephalomalacia identified.  Mild left mastoid effusion has been present since a 2014 neck CT comparison. Right mastoids are clear. Paranasal sinuses are clear. Negative orbit and scalp soft tissues.  IMPRESSION: 1. No acute infarct or acute intracranial abnormality evident on this noncontrast exam. Early metastatic/lymphoma involvement of the brain cannot be excluded in the absence of intravenous contrast. 2. 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 3. Mild chronic left mastoid effusion, most often  postinflammatory and significance doubtful.   Electronically Signed   By: Genevie Ann M.D.   On: 12/02/2014 16:45   Ct Abdomen Pelvis W Contrast  11/25/2014   CLINICAL DATA:  69 year old male with history of mantle cell lymphoma recently treated with chemotherapy.  EXAM: CT ABDOMEN AND PELVIS WITH CONTRAST  TECHNIQUE: Multidetector CT imaging of the abdomen and pelvis was performed using the standard protocol following bolus administration of intravenous contrast.  CONTRAST:  52mL OMNIPAQUE IOHEXOL 300 MG/ML  SOLN  COMPARISON:  CT of the abdomen and pelvis 11/01/2014.  FINDINGS: Lower chest: Retrocrural lymph nodes are again noted bilaterally, largest of which on the right measures 1.6 cm in short axis, similar to the prior examination. Small amount of pericardial fluid and/or thickening, unlikely to be of any hemodynamic significance at this time.  Hepatobiliary: Numerous tiny partially calcified gallstones are noted within the gallbladder. No findings to suggest acute cholecystitis at this time. No significant cystic or solid hepatic lesions. No intra or extrahepatic biliary ductal dilatation.  Pancreas: No pancreatic mass. No pancreatic ductal dilatation. No pancreatic or peripancreatic fluid or inflammatory changes.  Spleen: Spleen remains mildly enlarged, but is smaller than the prior examination, currently measuring up to 13.2 cm in length.  Adrenals/Urinary Tract: Thickening of the left adrenal gland is unchanged. Right adrenal gland is normal. Right kidney is normal in appearance. There is a left-sided double-J ureteral stent in position, with the proximal loop reformed in the interpolar region of the left renal collecting system, and the distal loop performed in the lumen of the urinary bladder. Left kidney is otherwise normal in appearance. No hydroureteronephrosis. Mild diffuse bladder wall thickening, likely in part related to underdistention of the bladder. No discrete bladder mass identified.   Stomach/Bowel: The appearance of the stomach is normal. No pathologic dilatation of small bowel or colon. Normal appendix.  Vascular/Lymphatic: Extensive lymphadenopathy is again noted throughout the abdomen and pelvis. The bulkiest retroperitoneal node or nodal conglomerate is in the left para-aortic nodal station immediately inferior to the left renal hilum, and this has decreased in size compared to the prior study, currently measuring 6.4 x 5.3 cm (image 25 of series 2), as compared with 8.8 x 6.9 cm on prior exam 11/09/2014. Bulky pelvic adenopathy is again noted bilaterally, generally similar to the prior study, with index lymph node in the left external iliac nodal station (image 57 of series 2) only slightly smaller than the prior examination, currently measuring 3.6 x 5.0 cm (previously 4.2 x 5.1 cm). However, some lymph nodes are larger than the prior examination, including a right external iliac lymph node, which currently measures 4.9 x 3.5 cm (image 59 of series 2) as compared with 4.7 x 3.0 cm on the prior study. Centrally low-attenuation presumably necrotic lymph node in the left common femoral nodal station measuring 3.4 x 4.8 cm. Extensive atherosclerosis throughout the abdominal and pelvic vasculature, without evidence of  aneurysm or dissection.  Reproductive: Coarse calcifications are noted in the prostate gland (nonspecific). Seminal vesicles are unremarkable in appearance.  Other: No significant volume of ascites.  No pneumoperitoneum.  Musculoskeletal: Status post L4 laminectomy. There are no aggressive appearing lytic or blastic lesions noted in the visualized portions of the skeleton.  IMPRESSION: 1. Today's study generally demonstrates slight positive response to therapy, with generalized decrease of lymphadenopathy in the abdomen and pelvis. However, there several lymph nodes which are essentially unchanged, and some lymph nodes which are slightly increased compared to the prior  examination, as discussed above. 2. Additional findings, as above, similar to the prior examination.   Electronically Signed   By: Vinnie Langton M.D.   On: 11/25/2014 13:12   Ct Abdomen Pelvis W Contrast  11/09/2014   CLINICAL DATA:  Diffuse abdominal pain.  Chemotherapy yesterday.  EXAM: CT ABDOMEN AND PELVIS WITH CONTRAST  TECHNIQUE: Multidetector CT imaging of the abdomen and pelvis was performed using the standard protocol following bolus administration of intravenous contrast.  CONTRAST:  180mL OMNIPAQUE IOHEXOL 300 MG/ML  SOLN  COMPARISON:  10/23/2014  FINDINGS: BODY WALL: Partly visible left lower axillary lymphadenopathy, recently evaluated by chest CT 10/30/2014.  There is bulky bilateral inguinal lymphadenopathy which has increased from 10/23/2014, with left inguinal node on image 88 measuring 31 mm short axis, previously 23 mm  LOWER CHEST: Reticular opacities in the lower lungs, greater on the right, is favored atelectasis. No definitive pneumonia. Lower thoracic lymphadenopathy has increased, with a left retrocrural lymph node currently 15 mm on image 23, previously 10 mm.  ABDOMEN/PELVIS:  Liver: No focal abnormality.  Biliary: Cholelithiasis. The gallbladder is distended but there is no inflammatory wall thickening.  Pancreas: Unremarkable.  Spleen: Mild splenomegaly, 15 cm in craniocaudal span. This is increased by approximately 4 cm since previous.  Adrenals: Negative.  Kidneys and ureters: Moderate bilateral hydroureteronephrosis to the level of the pelvis. This is despite an internal ureteral stent on the left which is well positioned. Symmetric renal enhancement.  Bladder: Thickening of the bladder wall, preferentially to the left, stable from previous. This is indeterminate and will be assessed on followup imaging.  Reproductive: No pathologic findings.  Bowel: No obstruction. No inflammatory bowel wall thickening  Retroperitoneum: There is bulky and diffuse retroperitoneal  lymphadenopathy. There has been significant and rapid increased from previous. Index left periaortic lymph node on image 39 measures 88 x 69 mm (previously 59 x 35 mm) on image 39). Left pelvic sidewall lymph node that was previously measured is on image 71 and stable at 58 x 41 mm. There is new enlargement of upper abdominal retroperitoneal lymph nodes . Some cystic change, presumably treatment related, present in the largest nodes, especially in the left pelvis.  Vascular: Mass effect on the left iliac veins by lymphadenopathy, with progressive subcutaneous edema in the left upper thigh.  OSSEOUS: No acute abnormalities.  IMPRESSION: 1. Rapid progression of lymphoma with marked enlargement of lymph nodes compared to 10/23/2014. 2. Bilateral hydroureteronephrosis, new on the right and unchanged on the left (despite a well-positioned left internal ureteral stent). 3. Progressive edema in the upper left thigh. Given severe compression of the left iliac vein by lymphadenopathy, consider Doppler to evaluate for DVT.   Electronically Signed   By: Monte Fantasia M.D.   On: 11/09/2014 02:13   US Renal  11/30/2014   CLINICAL DATA:  Lymphoma, urinary tract infection, previous placement of a left ureteral stent for obstructive lymphadenopathy and mass  effect. Followup  EXAM: RENAL / URINARY TRACT ULTRASOUND COMPLETE  COMPARISON:  CT 11/25/2014  FINDINGS: Right Kidney:  Length: 12.4 cm. No mass or hydronephrosis. Mild cortical atrophy with increased cortical echogenicity.  Left Kidney:  Length: 14.3 cm. Improvement in now mild hydronephrosis. Mild left renal cortical thinning and increased echogenicity. Stent position not well visualized.  Bladder:  Concentric bladder wall thickening, 0.9 cm.  IMPRESSION: Improvement in now mild left hydronephrosis compared to the dissimilar prior exam.  Bilateral increased renal cortical echogenicity suggesting medical renal disease with mild degree of cortical atrophy.  Concentric  bladder wall thickening which is nonspecific and may be seen with urinary tract infection/ cystitis, infiltrative processes including lymphomatous involvement, or treatment effect.   Electronically Signed   By: Conchita Paris M.D.   On: 11/30/2014 11:24   [place] Extrem Low Left Comp  11/09/2014   CLINICAL DATA:  Left calf swelling and lower extremity pain. History of lymphoma.  EXAM: Left LOWER EXTREMITY VENOUS DOPPLER ULTRASOUND  TECHNIQUE: Gray-scale sonography with graded compression, as well as color Doppler and duplex ultrasound were performed to evaluate the lower extremity deep venous systems from the level of the common femoral vein and including the common femoral, femoral, profunda femoral, popliteal and calf veins including the posterior tibial, peroneal and gastrocnemius veins when visible. The superficial great saphenous vein was also interrogated. Spectral Doppler was utilized to evaluate flow at rest and with distal augmentation maneuvers in the common femoral, femoral and popliteal veins.  COMPARISON:  None.  FINDINGS: Contralateral Common Femoral Vein: Respiratory phasicity is normal and symmetric with the symptomatic side. No evidence of thrombus. Normal compressibility.  Common Femoral Vein: No evidence of thrombus. Normal compressibility, respiratory phasicity and response to augmentation.  Saphenofemoral Junction - Superficial Great Saphenous Vein: Due to patient pain, the patient requested that the examination be terminated therefore the superficial femoral vein through popliteal trifurcation could not be evaluated.  IMPRESSION: No evidence of deep venous thrombosis involving the upper common femoral vein. The patient requested that the examination be terminated and did not allow the sonographer to examine the lower extremity venous system from the level of the saphenous femoral junction through the popliteal trifurcation.   Electronically Signed   By: Conchita Paris M.D.   On: 11/09/2014  09:40   Dg Chest Portable 1 View  11/29/2014   CLINICAL DATA:  Altered mental status. Possible reaction to chemotherapy.  EXAM: PORTABLE CHEST - 1 VIEW  COMPARISON:  11/15/2014  FINDINGS: Mild-to-moderate enlargement of the cardiopericardial silhouette with cephalization of blood flow. Faint Kerley B-lines noted at the right lung base, but no airspace edema.  Right IJ Port-A-Cath tip: SVC.  Tortuous thoracic aorta.  Deformity from prior surgical neck fracture the right humerus which appears healed.  IMPRESSION: Mild to moderate enlargement of the cardiopericardial silhouette with faint interstitial edema. No airspace opacity identified.   Electronically Signed   By: Van Clines M.D.   On: 11/29/2014 13:31   Dg Chest Port 1 View  11/15/2014   CLINICAL DATA:  Shortness of breath and cough.  Weakness.  EXAM: PORTABLE CHEST - 1 VIEW  COMPARISON:  11/08/2014  FINDINGS: Tip of the right chest port remains in the SVC. The heart is at the upper limits normal in size. There are increased perihilar markings, progressed from prior exam. Increased right basilar atelectasis, now with mild left basilar atelectasis. No definite pleural effusion or pneumothorax.  IMPRESSION: Increased perihilar markings concerning pulmonary edema. Increased right basilar  atelectasis in development of mild left basilar atelectasis.   Electronically Signed   By: Jeb Levering M.D.   On: 11/15/2014 04:43   Dg Chest Port 1 View  11/08/2014   CLINICAL DATA:  Acute onset of sepsis.  Initial encounter.  EXAM: PORTABLE CHEST - 1 VIEW  COMPARISON:  Chest radiograph performed 11/12/2013, and CTA of the chest performed 10/30/2014  FINDINGS: The lungs are well-aerated. Mild vascular congestion is noted, with mild right basilar atelectasis. There is no evidence of pleural effusion or pneumothorax.  The cardiomediastinal silhouette is within normal limits. No acute osseous abnormalities are seen. A right-sided chest port is noted ending about  the mid SVC.  IMPRESSION: Mild vascular congestion, with mild right basilar atelectasis. Lungs otherwise clear.   Electronically Signed   By: Garald Balding M.D.   On: 11/08/2014 23:06     CBC  Recent Labs Lab 11/29/14 1318  12/01/14 0436 12/02/14 0353 12/03/14 0437 12/03/14 0633 12/04/14 0839 12/05/14 0530  WBC 9.5  < > 7.4 6.3 3.6* 3.7* 2.7* 1.7*  HGB 7.0*  < > 8.2* 7.7* 6.8* 7.3* 7.2* 7.5*  HCT 21.9*  < > 24.8* 23.8* 20.8* 22.4* 22.2* 23.2*  PLT 288  < > 191 123*  --  57* 24* 13*  MCV 86.5  < > 85.7 86.2 86.1 87.8 87.0 86.8  MCH 27.5  < > 28.3 28.0 28.3 28.4 28.2 28.1  MCHC 31.8*  < > 33.1 32.5 32.9 32.4 32.5 32.4  RDW 20.2*  < > 18.8* 19.0* 19.0* 19.5* 20.0* 20.1*  LYMPHSABS 0.4*  --   --   --   --   --  1.0  --   MONOABS 0.2  --   --   --   --   --  0.1*  --   EOSABS 0.0  --   --   --   --   --  0.1  --   BASOSABS 0.0  --   --   --   --   --  0.0  --   < > = values in this interval not displayed.  Chemistries   Recent Labs Lab 11/29/14 1009 11/29/14 1318  12/02/14 0353 12/03/14 0437 12/03/14 0633 12/03/14 1226 12/03/14 2359 12/04/14 0459 12/04/14 2000 12/05/14 0530  NA 137 139  < > 154* 160* 165*  --   --  163*  --  164*  K 4.8 4.7  < > 3.1* 2.9* 3.2* 3.2*  --  2.8* 3.1* 3.2*  CL 108 112*  < > 126* >130* >130*  --   --  >130*  --  >130*  CO2 19* 19*  < > 18* 18* 17*  --   --  19*  --  18*  GLUCOSE 139* 127*  < > 161* 191* 175*  --   --  182*  --  150*  BUN 24* 25*  < > 56* 72* 69*  --   --  66*  --  57*  CREATININE 1.24 1.21  < > 2.18* 2.61* 2.58*  --   --  2.45*  --  2.37*  CALCIUM 8.4* 8.7*  < > 8.7* 8.3* 8.9  --   --  9.0  --  9.1  MG  --   --   --   --  2.6* 2.5*  --   --  2.4  --   --   AST 44* 43*  --   --   --   --   --  20  --   --   --   ALT 45 42  --   --   --   --   --  17  --   --   --   ALKPHOS 283* 268*  --   --   --   --   --  141*  --   --   --   BILITOT 0.5 0.6  --   --   --   --   --  2.1*  --   --   --   < > = values in this interval not  displayed. ------------------------------------------------------------------------------------------------------------------ estimated creatinine clearance is 31.4 mL/min (by C-G formula based on Cr of 2.37). ------------------------------------------------------------------------------------------------------------------ No results for input(s): HGBA1C in the last 72 hours. ------------------------------------------------------------------------------------------------------------------ No results for input(s): CHOL, HDL, LDLCALC, TRIG, CHOLHDL, LDLDIRECT in the last 72 hours. ------------------------------------------------------------------------------------------------------------------  Recent Labs  12/03/14 2359  TSH 0.362   ------------------------------------------------------------------------------------------------------------------  Recent Labs  12/03/14 2359 12/04/14 0459  VITAMINB12  --  1237*  FOLATE 22.2  --     Coagulation profile  Recent Labs Lab 11/29/14 1318  INR 1.33    No results for input(s): DDIMER in the last 72 hours.  Cardiac Enzymes  Recent Labs Lab 11/29/14 1549 11/29/14 2121 11/30/14 0412  TROPONINI 0.06* 0.05* 0.07*   ------------------------------------------------------------------------------------------------------------------ Invalid input(s): POCBNP  Assessment & Plan   #1 Sepsis: Likely due to UTI. Patient's urine culture and blood cultures are positive for Klebsiella.  - Hemodynamically stable, afebrile. Continue IV ceftriaxone.  - will need a total of 14 days of IV abx therapy.   #2 altered mental status - This is likely due to metabolic encephalopathy due to sepsis and urinary tract infection/ARF/Hypernatremia.  - CT head/MRI brain showing no evidence of acute pathology.  Seen by Neurology and as per them this is likely metabolic encephalopathy too.  - cont. D5W for hypernatremia, cont. IV abx for UTI and follow mental  status.  - avoid Benzo's. keep Off Precedex gtt and cont. PRN Geodon, Valium for now.   #3 chest pain: No EKG changes to suggest ischemia.  Continue to monitor for any further cardiac symptoms  #4 urinary tract infection: Continue IV ceftriaxone. Urine culture positive for Klebsiella.  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 Patient follows with Dr. Mike Gip.    #6. Panctytopenia: Due to chemotherapy and Mantle cell lymphoma, status post 2 units of packed RBCs and hemoglobin stable and will continue to monitor. - follow daily counts.    #7. Acute renal failure with hypernatremia: Appreciate nephrology input. -Continue D5W and rate increased by Nephro and follow sodium.  Creatinine improving and urine output is fair with 675cc of urine in the past 24 hours.  #8 Hypokalemia - likely due to dehydration and will cont. To supplement and it's improving.   - Mg level normal.    #9 Thrombocytopenia - likely due to sepsis and will monitor counts.  Avoid heparin products.   No acute bleeding.  ?? May benefit from platelet transfusion but will await Oncology input.   Prognosis seems to be very poor palliative care consult appreciated patient is DO NOT RESUSCITATE     Code Status Orders        Start     Ordered   11/29/14 1731  Full code   Continuous     11/29/14 1730     DNR  DVT Prophylaxis   SCD's   Lab Results  Component Value  Date   PLT 13* 12/05/2014     Total Time Spent in minutes - 30 min    SAINANI,VIVEK J M.D on 12/05/2014 at 12:07 PM  Between 7am to 6pm - Pager - [phone]  After 6pm go to [email or web address] - password [email or web address] [facility]   Office  [phone]

## 2014-12-05 NOTE — Consult Note (Signed)
Palliative Medicine Inpatient Consult Follow Up Note   Name: Caleb Francis Date: 12/05/2014 MRN: [id number]  DOB: [birth year]  Referring Physician: Henreitta Leber, MD  Palliative Care consult requested for this 69 y.o. male for goals of medical therapy in patient with mantle cell lymphoma, getting chemotherapy, admitted with altered mental status  Caleb Francis is lying in bed in CCU, intermittently agitated. Agitated overnight and prededex drip started. Son at bedside.    REVIEW OF SYSTEMS:  Patient is not able to provide ROS  CODE STATUS: DNR   PAST MEDICAL HISTORY: Past Medical History  Diagnosis Date  . Arthritis   . Hypertension   . RA (rheumatoid arthritis)   . Anemia   . Cancer     lymphoma  . History of nuclear stress test     a. 12/2013: low risk, no sig ischemia, no EKG changes, no artifact, EF 63%  . Chronic kidney disease     PAST SURGICAL HISTORY:  Past Surgical History  Procedure Laterality Date  . Back surgery    . Fracture surgery      ankle  . Portacath placement    . Cystoscopy w/ ureteral stent placement Left 10/24/2014    Procedure: CYSTOSCOPY WITH RETROGRADE PYELOGRAM/URETERAL STENT PLACEMENT;  Surgeon: Irine Seal, MD;  Location: [facility] ORS;  Service: Urology;  Laterality: Left;    Vital Signs: BP 110/65 mmHg  Pulse 95  Temp(Src) 97.6 F (36.4 C) (Oral)  Resp 23  Ht 6' (1.829 m)  Wt 73.483 kg (162 lb)  BMI 21.97 kg/m2  SpO2 100% Filed Weights   11/29/14 1222  Weight: 73.483 kg (162 lb)    Estimated body mass index is 21.97 kg/(m^2) as calculated from the following:   Height as of this encounter: 6' (1.829 m).   Weight as of this encounter: 73.483 kg (162 lb).  PHYSICAL EXAM: General: Critically ill appearing HEENT: OP clear, poor dentition Neck: Trachea midline  Cardiovascular: regular rate and rhythm Pulmonary/Chest: fair air movemnt ant fields, no audible wheeze Abdominal: Soft, hypoactive bowel sounds GU: no SP  tenderness Extremities: no edema BLE's Neurological: moves extremities, mumbles words, does not follow commands  Skin: no rashes Psychiatric: agitated  LABS: CBC:    Component Value Date/Time   WBC 1.7* 12/05/2014 0530   WBC 6.4 07/17/2014 1121   HGB 7.5* 12/05/2014 0530   HGB 11.8* 07/17/2014 1121   HCT 23.2* 12/05/2014 0530   HCT 35.0* 07/17/2014 1121   PLT 13* 12/05/2014 0530   PLT 263 07/17/2014 1121   MCV 86.8 12/05/2014 0530   MCV 83 07/17/2014 1121   NEUTROABS 1.6 12/04/2014 0839   NEUTROABS 3.4 07/17/2014 1121   LYMPHSABS 1.0 12/04/2014 0839   LYMPHSABS 1.9 07/17/2014 1121   MONOABS 0.1* 12/04/2014 0839   MONOABS 0.8 07/17/2014 1121   EOSABS 0.1 12/04/2014 0839   EOSABS 0.2 07/17/2014 1121   BASOSABS 0.0 12/04/2014 0839   BASOSABS 0.0 07/17/2014 1121   Comprehensive Metabolic Panel:    Component Value Date/Time   NA PENDING 12/05/2014 0530   NA 141 07/17/2014 1121   K 3.2* 12/05/2014 0530   K 2.8* 07/17/2014 1121   CL >130* 12/05/2014 0530   CL 100 07/17/2014 1121   CO2 18* 12/05/2014 0530   CO2 32 07/17/2014 1121   BUN 57* 12/05/2014 0530   BUN 9 07/17/2014 1121   CREATININE 2.37* 12/05/2014 0530   CREATININE 1.09 07/17/2014 1121   GLUCOSE 150* 12/05/2014 0530   GLUCOSE 105* 07/17/2014  1121   CALCIUM 9.1 12/05/2014 0530   CALCIUM 8.9 07/17/2014 1121   AST 20 12/03/2014 2359   AST 17 07/17/2014 1121   ALT 17 12/03/2014 2359   ALT 12* 07/17/2014 1121   ALKPHOS 141* 12/03/2014 2359   ALKPHOS 79 07/17/2014 1121   BILITOT 2.1* 12/03/2014 2359   PROT 5.9* 12/03/2014 2359   PROT 7.3 07/17/2014 1121   ALBUMIN 2.0* 12/03/2014 2359   ALBUMIN 3.1* 07/17/2014 1121    IMPRESSION: Caleb Francis is a 69 yo man with PMH of mantle cell lymphoma dx 02/2013 on salvage chemo, HTN, RA, 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 He was hospitalzed 5/27-11/19/14 with abd pain, sepsis, A/CKD. He was discharged to SNF and continued with chemotherapy. He was  readmitted 11/29/14 with altered mental status. Urine and blood cx's + for Klebsiella. Mental status remains poor. MRI without contrast shows no acute change. EEG c/w metabolic encephalopathy. [name] mental status still not back to baseline.   I spoke at length with son at bedside. Son says that pt was calm yesterday and talked with him. He says that wife had the same experience last night and they feel pt became agitated when family left. Family will try to stay at bedside as much as possible. Will stop precedex and observe.   PLAN: As above  REFERRALS TO BE ORDERED:  Chaplain   More than 50% of the visit was spent in counseling/coordination of care: YES  Time spent: 35 minutes

## 2014-12-05 NOTE — Progress Notes (Signed)
Brief Nutrition Follow-up: Received Dietitian Consult for TF. Per Linna Hoff RN, NG placed this afternoon, awaiting results via xray. Pt with 1:1 sitter, wearing mittens. Recommend starting Jevity 1.5 at rate of 20 ml/hr, recommend checking magnesium and phosphorus in AM. Plan to discontinue TPN, TPN not started as of yet. Goal rate of TF at present is 50 ml/hr. Continue to assess

## 2014-12-05 NOTE — Progress Notes (Signed)
Dr. Jannifer Franklin is notified about patient's increased HR in 130s, increased confusion, hallucinations, and new onset of head and upper extremities tremors. Pt denies any pain. Patient received Morphine 4mg  earlier for comfort, unable to give any haldol due to increased QTc .049. New order received to restart Precedex drip. Continue to monitor the patient.

## 2014-12-06 ENCOUNTER — Inpatient Hospital Stay: Payer: Medicare Other

## 2014-12-06 LAB — CBC
HEMATOCRIT: 21.6 % — AB (ref 40.0–52.0)
Hemoglobin: 7.2 g/dL — ABNORMAL LOW (ref 13.0–18.0)
MCH: 28.9 pg (ref 26.0–34.0)
MCHC: 33.4 g/dL (ref 32.0–36.0)
MCV: 86.6 fL (ref 80.0–100.0)
PLATELETS: 9 10*3/uL — AB (ref 150–400)
RBC: 2.5 MIL/uL — AB (ref 4.40–5.90)
RDW: 20.3 % — AB (ref 11.5–14.5)
WBC: 1 10*3/uL — AB (ref 3.8–10.6)

## 2014-12-06 LAB — COMPREHENSIVE METABOLIC PANEL
ALBUMIN: 2.2 g/dL — AB (ref 3.5–5.0)
ALT: 20 U/L (ref 17–63)
ANION GAP: 6 (ref 5–15)
AST: 28 U/L (ref 15–41)
Alkaline Phosphatase: 141 U/L — ABNORMAL HIGH (ref 38–126)
BUN: 42 mg/dL — AB (ref 6–20)
CHLORIDE: 130 mmol/L — AB (ref 101–111)
CO2: 18 mmol/L — ABNORMAL LOW (ref 22–32)
Calcium: 8.6 mg/dL — ABNORMAL LOW (ref 8.9–10.3)
Creatinine, Ser: 2.04 mg/dL — ABNORMAL HIGH (ref 0.61–1.24)
GFR calc Af Amer: 37 mL/min — ABNORMAL LOW (ref >60)
GFR calc non Af Amer: 32 mL/min — ABNORMAL LOW (ref >60)
Glucose, Bld: 208 mg/dL — ABNORMAL HIGH (ref 65–99)
Potassium: 4.2 mmol/L (ref 3.5–5.1)
Sodium: 154 mmol/L — ABNORMAL HIGH (ref 135–145)
TOTAL PROTEIN: 5.9 g/dL — AB (ref 6.5–8.1)
Total Bilirubin: 2.2 mg/dL — ABNORMAL HIGH (ref 0.3–1.2)

## 2014-12-06 LAB — GLUCOSE, CAPILLARY
GLUCOSE-CAPILLARY: 139 mg/dL — AB (ref 65–99)
GLUCOSE-CAPILLARY: 149 mg/dL — AB (ref 65–99)
GLUCOSE-CAPILLARY: 158 mg/dL — AB (ref 65–99)
GLUCOSE-CAPILLARY: 165 mg/dL — AB (ref 65–99)
GLUCOSE-CAPILLARY: 173 mg/dL — AB (ref 65–99)
GLUCOSE-CAPILLARY: 207 mg/dL — AB (ref 65–99)
Glucose-Capillary: 155 mg/dL — ABNORMAL HIGH (ref 65–99)

## 2014-12-06 LAB — PREALBUMIN: Prealbumin: 10.5 mg/dL — ABNORMAL LOW (ref 18–38)

## 2014-12-06 LAB — PHOSPHORUS: PHOSPHORUS: 2.4 mg/dL — AB (ref 2.5–4.6)

## 2014-12-06 LAB — PREPARE PLATELET PHERESIS: Unit division: 0

## 2014-12-06 LAB — MAGNESIUM: Magnesium: 1.9 mg/dL (ref 1.7–2.4)

## 2014-12-06 LAB — TRIGLYCERIDES: Triglycerides: 262 mg/dL — ABNORMAL HIGH (ref <150)

## 2014-12-06 MED ORDER — METHYLPREDNISOLONE SODIUM SUCC 125 MG IJ SOLR
60.0000 mg | Freq: Once | INTRAMUSCULAR | Status: DC
  Filled 2014-12-06: qty 2

## 2014-12-06 MED ORDER — METHYLPREDNISOLONE SODIUM SUCC 125 MG IJ SOLR
INTRAMUSCULAR | Status: AC
  Administered 2014-12-06: 19:00:00
  Filled 2014-12-06: qty 2

## 2014-12-06 MED ORDER — VANCOMYCIN HCL IN DEXTROSE 1-5 GM/200ML-% IV SOLN
1000.0000 mg | INTRAVENOUS | Status: AC
  Administered 2014-12-06: 1000 mg via INTRAVENOUS
  Filled 2014-12-06: qty 200

## 2014-12-06 MED ORDER — SODIUM CHLORIDE 0.9 % IV SOLN
Freq: Once | INTRAVENOUS | Status: AC
  Administered 2014-12-06: 13:00:00 via INTRAVENOUS

## 2014-12-06 MED ORDER — DOCUSATE SODIUM 50 MG/5ML PO LIQD
50.0000 mg | Freq: Two times a day (BID) | ORAL | Status: DC
  Administered 2014-12-06: 100 mg via ORAL
  Administered 2014-12-06 – 2014-12-07 (×3): 50 mg via ORAL
  Administered 2014-12-08: 100 mg via ORAL
  Filled 2014-12-06 (×5): qty 10

## 2014-12-06 MED ORDER — ACETAMINOPHEN 325 MG PO TABS
650.0000 mg | ORAL_TABLET | Freq: Once | ORAL | Status: AC
  Administered 2014-12-06: 650 mg via ORAL

## 2014-12-06 MED ORDER — POTASSIUM CL IN DEXTROSE 5% 20 MEQ/L IV SOLN
20.0000 meq | INTRAVENOUS | Status: DC
  Administered 2014-12-06 – 2014-12-07 (×2): 20 meq via INTRAVENOUS
  Filled 2014-12-06 (×16): qty 1000

## 2014-12-06 MED ORDER — POTASSIUM CL IN DEXTROSE 5% 20 MEQ/L IV SOLN
20.0000 meq | INTRAVENOUS | Status: DC
  Filled 2014-12-06 (×5): qty 1000

## 2014-12-06 MED ORDER — VANCOMYCIN HCL IN DEXTROSE 1-5 GM/200ML-% IV SOLN
1000.0000 mg | INTRAVENOUS | Status: DC
  Administered 2014-12-07: 1000 mg via INTRAVENOUS
  Filled 2014-12-06 (×2): qty 200

## 2014-12-06 MED ORDER — PIPERACILLIN-TAZOBACTAM 4.5 G IVPB
4.5000 g | Freq: Three times a day (TID) | INTRAVENOUS | Status: DC
  Administered 2014-12-06 – 2014-12-07 (×2): 4.5 g via INTRAVENOUS
  Filled 2014-12-06 (×6): qty 100

## 2014-12-06 MED ORDER — JEVITY 1.5 CAL/FIBER PO LIQD
1000.0000 mL | ORAL | Status: DC
  Administered 2014-12-06: 1000 mL via NASOGASTRIC

## 2014-12-06 MED ORDER — DEXTROSE 5 % IV SOLN: 0.0000 ug/min | INTRAVENOUS | Status: DC

## 2014-12-06 MED ORDER — DIPHENHYDRAMINE HCL 50 MG/ML IJ SOLN
INTRAMUSCULAR | Status: AC
  Filled 2014-12-06: qty 1

## 2014-12-06 MED ORDER — VANCOMYCIN HCL IN DEXTROSE 1-5 GM/200ML-% IV SOLN: 1000.0000 mg | Freq: Two times a day (BID) | INTRAVENOUS | Status: DC

## 2014-12-06 MED ORDER — PIPERACILLIN-TAZOBACTAM 3.375 G IVPB 30 MIN: 3.3750 g | Freq: Four times a day (QID) | INTRAVENOUS | Status: DC

## 2014-12-06 MED ORDER — CEFTRIAXONE SODIUM IN DEXTROSE 20 MG/ML IV SOLN
1.0000 g | INTRAVENOUS | Status: DC
  Administered 2014-12-06: 1 g via INTRAVENOUS
  Filled 2014-12-06 (×2): qty 50

## 2014-12-06 MED ORDER — SCOPOLAMINE 1 MG/3DAYS TD PT72
1.0000 | MEDICATED_PATCH | TRANSDERMAL | Status: DC
  Administered 2014-12-06 – 2015-01-23 (×17): 1.5 mg via TRANSDERMAL
  Filled 2014-12-06 (×19): qty 1

## 2014-12-06 MED ORDER — METOPROLOL TARTRATE 1 MG/ML IV SOLN
INTRAVENOUS | Status: AC
  Administered 2014-12-06: 5 mg via INTRAVENOUS
  Filled 2014-12-06: qty 5

## 2014-12-06 MED ORDER — METOPROLOL TARTRATE 1 MG/ML IV SOLN
5.0000 mg | INTRAVENOUS | Status: DC | PRN
  Administered 2014-12-06 – 2014-12-27 (×3): 5 mg via INTRAVENOUS
  Filled 2014-12-06 (×3): qty 5

## 2014-12-06 MED ORDER — MAGNESIUM SULFATE IN D5W 10-5 MG/ML-% IV SOLN
1.0000 g | Freq: Once | INTRAVENOUS | Status: AC
  Administered 2014-12-06: 1 g via INTRAVENOUS
  Filled 2014-12-06: qty 100

## 2014-12-06 MED ORDER — NOREPINEPHRINE 4 MG/250ML-% IV SOLN
INTRAVENOUS | Status: AC
  Administered 2014-12-06: 4 mg via INTRAVENOUS
  Filled 2014-12-06: qty 250

## 2014-12-06 MED ORDER — NOREPINEPHRINE 4 MG/250ML-% IV SOLN
0.0000 ug/min | INTRAVENOUS | Status: DC
  Administered 2014-12-06: 4 mg via INTRAVENOUS
  Administered 2014-12-06: 2 ug/min via INTRAVENOUS

## 2014-12-06 MED ORDER — DIPHENHYDRAMINE HCL 50 MG/ML IJ SOLN
25.0000 mg | Freq: Once | INTRAMUSCULAR | Status: AC
  Administered 2014-12-06: 25 mg via INTRAVENOUS
  Filled 2014-12-06: qty 1

## 2014-12-06 MED ORDER — SENNOSIDES 8.8 MG/5ML PO SYRP
5.0000 mL | ORAL_SOLUTION | Freq: Two times a day (BID) | ORAL | Status: DC
  Administered 2014-12-06 – 2014-12-07 (×4): 5 mL via ORAL
  Administered 2014-12-08: 1 mL via ORAL
  Filled 2014-12-06 (×5): qty 5

## 2014-12-06 MED ORDER — DIPHENHYDRAMINE HCL 50 MG/ML IJ SOLN
25.0000 mg | Freq: Once | INTRAMUSCULAR | Status: AC
  Administered 2014-12-06: 25 mg via INTRAVENOUS

## 2014-12-06 NOTE — Progress Notes (Signed)
[place] at [facility]                                                                                                                                                                                            Patient Demographics   Caleb Francis, is a 69 y.o. male, DOB - 01/27/1946, DGU:[id number]  Admit date - 11/29/2014   Admitting Physician Aldean Jewett, MD  Outpatient Primary MD for the patient is No PCP Per Patient   LOS - 7  Subjective: called by nurse due to patient having elevated hr, bp lower after starting platletes, pt currnelty unresponsive    Review of Systems:    unable to provide  Vitals:   Filed Vitals:   12/06/14 1700 12/06/14 1800 12/06/14 1830 12/06/14 1900  BP: 127/73 132/74  90/76  Pulse: 132 128    Temp:  98.6 F (37 C) 98.6 F (37 C) 98.5 F (36.9 C)  TempSrc:  Axillary Axillary Axillary  Resp: 37 30    Height:      Weight:      SpO2: 100% 100%      Wt Readings from Last 3 Encounters:  12/06/14 69.7 kg (153 lb 10.6 oz)  11/27/14 68.2 kg (150 lb 5.7 oz)  11/25/14 70.761 kg (156 lb)     Intake/Output Summary (Last 24 hours) at 12/06/14 1926 Last data filed at 12/06/14 1812  Gross per 24 hour  Intake 1309.58 ml  Output   4050 ml  Net -2740.42 ml    Physical Exam:   GENERAL: poorly responisve critically ille HEAD, EYES, EARS, NOSE AND THROAT: Atraumatic, normocephalic. Extraocular muscles are intact. Pupils equal and reactive to light. Sclerae anicteric. No conjunctival injection. No oro-pharyngeal erythema.  NECK: Supple. There is no jugular venous distention. No bruits, no lymphadenopathy, no thyromegaly.  HEART: Regular rate and rhythm, tachycardic. No murmurs, no rubs, no clicks.  LUNGS: Clear to auscultation bilaterally. No rales or rhonchi. No wheezes.  ABDOMEN: Soft, flat, nontender, nondistended. Has good bowel sounds. No hepatosplenomegaly appreciated.  EXTREMITIES: No evidence of any  cyanosis, clubbing, or peripheral edema.  +2 pedal and radial pulses bilaterally.  NEUROLOGIC: The patient is alert, awake, and oriented x3 with no focal motor or sensory deficits appreciated bilaterally.  SKIN: Moist and warm with no rashes appreciated.  Psych: anxious LN: No inguinal LN enlargement    Antibiotics   Anti-infectives    Start     Dose/Rate Route Frequency Ordered Stop   12/01/14 0945  piperacillin-tazobactam (ZOSYN) IVPB 3.375 g  Status:  Discontinued     3.375 g 12.5  mL/hr over 240 Minutes Intravenous 3 times per day 12/01/14 0936 12/03/14 1337   11/30/14 1000  cefTRIAXone (ROCEPHIN) 2 g in dextrose 5 % 50 mL IVPB - Premix    Comments:  Entered per MD progress note   2 g 100 mL/hr over 30 Minutes Intravenous Every 24 hours 11/29/14 2157 12/13/14 2359   11/29/14 1415  cefTRIAXone (ROCEPHIN) 1 g in dextrose 5 % 50 mL IVPB - Premix     1 g 100 mL/hr over 30 Minutes Intravenous  Once 11/29/14 1406 11/29/14 1452   11/29/14 1414  cefTRIAXone (ROCEPHIN) 20 MG/ML IVPB 50 mL    Comments:  MONAR, NELLIE: cabinet override      11/29/14 1414 11/29/14 1424      Medications   Scheduled Meds: . acetaminophen  650 mg Oral Once  . antiseptic oral rinse  7 mL Mouth Rinse BID  . aspirin  324 mg Oral Once  . cefTRIAXone (ROCEPHIN)  IV  2 g Intravenous Q24H  . diphenhydrAMINE      . diphenhydrAMINE  25 mg Intravenous Once  . docusate  50 mg Oral BID  . filgrastim  480 mcg Subcutaneous Once  . filgrastim  480 mcg Subcutaneous Daily  . insulin aspart  1-3 Units Subcutaneous Q6H  . lactulose  30 g Oral BID  . methylPREDNISolone (SOLU-MEDROL) injection  60 mg Intravenous Once  . methylPREDNISolone sodium succinate      . norepinephrine      . scopolamine  1 patch Transdermal Q72H  . sennosides  5 mL Oral BID  . sodium chloride  3 mL Intravenous Q12H  . thiamine IV  100 mg Intravenous Daily  . ziprasidone  10 mg Intramuscular QHS   Continuous Infusions: . dextrose 5 % with  KCl 20 mEq / L 20 mEq (12/06/14 1200)  . feeding supplement (JEVITY 1.5 CAL/FIBER) 1,000 mL (12/06/14 1044)  . free water    . norepinephrine (LEVOPHED) Adult infusion     PRN Meds:.acetaminophen **OR** acetaminophen, haloperidol lactate, heparin lock flush, heparin lock flush, magnesium hydroxide, morphine injection, sodium chloride, sodium chloride, ziprasidone   Data Review:   Micro Results Recent Results (from the past 240 hour(s))  Culture, blood (single)     Status: None   Collection Time: 11/29/14 11:09 AM  Result Value Ref Range Status   Specimen Description BLOOD  Final   Special Requests NONE  Final   Culture  Setup Time   Final    GRAM NEGATIVE RODS AEROBIC BOTTLE ONLY CRITICAL RESULT CALLED TO, READ BACK BY AND VERIFIED WITH: SANDRA VORBA ON 11/30/14 AT 0825 BY JEF    Culture KLEBSIELLA OXYTOCA AEROBIC BOTTLE ONLY   Final   Report Status 12/04/2014 FINAL  Final   Organism ID, Bacteria KLEBSIELLA OXYTOCA  Final      Susceptibility   Klebsiella oxytoca - MIC*    AMPICILLIN >=32 RESISTANT Resistant     CEFTAZIDIME <=1 SENSITIVE Sensitive     CEFAZOLIN >=64 RESISTANT Resistant     CEFTRIAXONE <=1 SENSITIVE Sensitive     CIPROFLOXACIN <=0.25 SENSITIVE Sensitive     GENTAMICIN <=1 SENSITIVE Sensitive     IMIPENEM <=0.25 SENSITIVE Sensitive     TRIMETH/SULFA <=20 SENSITIVE Sensitive     CEFOXITIN <=4 SENSITIVE Sensitive     * KLEBSIELLA OXYTOCA  Urine culture     Status: None   Collection Time: 11/29/14 11:22 AM  Result Value Ref Range Status   Specimen Description URINE, CLEAN CATCH  Final  Special Requests NONE  Final   Culture >=100,000 COLONIES/mL KLEBSIELLA OXYTOCA  Final   Report Status 12/01/2014 FINAL  Final   Organism ID, Bacteria KLEBSIELLA OXYTOCA  Final      Susceptibility   Klebsiella oxytoca - MIC*    AMPICILLIN >=32 RESISTANT Resistant     CEFAZOLIN >=64 RESISTANT Resistant     CEFTRIAXONE 4 SENSITIVE Sensitive     CIPROFLOXACIN <=0.25 SENSITIVE  Sensitive     GENTAMICIN <=1 SENSITIVE Sensitive     IMIPENEM <=0.25 SENSITIVE Sensitive     NITROFURANTOIN <=16 SENSITIVE Sensitive     TRIMETH/SULFA <=20 SENSITIVE Sensitive     CEFOXITIN <=4 SENSITIVE Sensitive     * >=100,000 COLONIES/mL KLEBSIELLA OXYTOCA  MRSA PCR Screening     Status: None   Collection Time: 11/29/14 12:13 PM  Result Value Ref Range Status   MRSA by PCR NEGATIVE NEGATIVE Final    Comment:        The GeneXpert MRSA Assay (FDA approved for NASAL specimens only), is one component of a comprehensive MRSA colonization surveillance program. It is not intended to diagnose MRSA infection nor to guide or monitor treatment for MRSA infections.   Culture, blood (routine x 2)     Status: None   Collection Time: 11/29/14  1:19 PM  Result Value Ref Range Status   Specimen Description BLOOD  Final   Special Requests Immunocompromised  Final   Culture NO GROWTH 5 DAYS  Final   Report Status 12/04/2014 FINAL  Final  Urine culture     Status: None   Collection Time: 11/30/14 10:12 AM  Result Value Ref Range Status   Specimen Description URINE, CATHETERIZED  Final   Special Requests Immunocompromised  Final   Culture NO GROWTH 2 DAYS  Final   Report Status 12/02/2014 FINAL  Final    Radiology Reports Dg Chest 1 View  12/06/2014   CLINICAL DATA:  NG tube placement  EXAM: CHEST  1 VIEW  COMPARISON:  11/29/2014  FINDINGS: Cardiomegaly again noted. Right IJ Port-A-Cath is unchanged in position. NG tube in place with tip in proximal stomach. There is hazy right basilar atelectasis or infiltrate. No pulmonary edema.  IMPRESSION: NG tube with tip in proximal stomach. Hazy right basilar atelectasis or infiltrate. No pulmonary edema.   Electronically Signed   By: Lahoma Crocker M.D.   On: 12/06/2014 14:40   Dg Abd 1 View  12/06/2014   CLINICAL DATA:  Nasogastric tube placement.  EXAM: ABDOMEN - 1 VIEW  COMPARISON:  12/05/2014  FINDINGS: The gastric bubble is distended. The  nasogastric tube tip is obscured by motion artifact but thought to be in the vicinity of the gastric cardia. Left renal stent noted. There continues to be some barium in the rectum.  IMPRESSION: 1. Nasogastric tube is indistinct due to motion artifact, but thought to terminate in the gastric cardia. Distended stomach bubble. 2. Left ureteral stent.   Electronically Signed   By: Van Clines M.D.   On: 12/06/2014 14:36   Ct Head Wo Contrast  11/29/2014   CLINICAL DATA:  Concern for chemotherapy adverse reaction. Patient transfer from cancer center. Altered mental status.  EXAM: CT HEAD WITHOUT CONTRAST  TECHNIQUE: Contiguous axial images were obtained from the base of the skull through the vertex without intravenous contrast.  COMPARISON:  Head CT 11/09/2014  FINDINGS: No acute intracranial hemorrhage. No focal mass lesion. No CT evidence of acute infarction. No midline shift or mass effect. No hydrocephalus.  Basilar cisterns are patent.  There is mild generalized cortical atrophy and proportional ventricular dilatation. Mild periventricular subcortical white matter hypodensities.  Paranasal sinuses and  mastoid air cells are clear.  IMPRESSION: 1. No acute intracranial findings.  No change from prior. 2. Mild atrophy and white matter microvascular disease.   Electronically Signed   By: Suzy Bouchard M.D.   On: 11/29/2014 13:48   Ct Head Wo Contrast  11/09/2014   CLINICAL DATA:  Degenerating mental status, sepsis and history of progressive mantle cell lymphoma.  EXAM: CT HEAD WITHOUT CONTRAST  TECHNIQUE: Contiguous axial images were obtained from the base of the skull through the vertex without intravenous contrast.  COMPARISON:  05/21/2009  FINDINGS: No findings by unenhanced head CT to suggest brain involvement by lymphoma. The brain demonstrates no evidence of hemorrhage, infarction, edema, mass effect, extra-axial fluid collection, hydrocephalus or mass lesion. The skull is unremarkable.   IMPRESSION: Unremarkable head CT.   Electronically Signed   By: Aletta Edouard M.D.   On: 11/09/2014 13:30   Mr Brain Wo Contrast  12/02/2014   CLINICAL DATA:  69 year old male with altered mental status, recent sepsis and urinary tract infection. Undergoing chemotherapy for mantle cell lymphoma. Initial encounter.  EXAM: MRI HEAD WITHOUT CONTRAST  TECHNIQUE: Multiplanar, multiecho pulse sequences of the brain and surrounding structures were obtained without intravenous contrast.  COMPARISON:  Head CTs without contrast 11/29/2014 and earlier.  FINDINGS: Study is intermittently degraded by motion artifact despite repeated imaging attempts.  Cerebral volume is within normal limits for age. No restricted diffusion to suggest acute infarction. No midline shift, mass effect, evidence of mass lesion, ventriculomegaly, extra-axial collection or acute intracranial hemorrhage. Cervicomedullary junction and pituitary are within normal limits. Major intracranial vascular flow voids are grossly preserved at the skullbase.  Minimal to mild for age nonspecific cerebral white matter signal changes. 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 No other encephalomalacia identified.  Mild left mastoid effusion has been present since a 2014 neck CT comparison. Right mastoids are clear. Paranasal sinuses are clear. Negative orbit and scalp soft tissues.  IMPRESSION: 1. No acute infarct or acute intracranial abnormality evident on this noncontrast exam. Early metastatic/lymphoma involvement of the brain cannot be excluded in the absence of intravenous contrast. 2. 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 3. Mild chronic left mastoid effusion, most often postinflammatory and significance doubtful.   Electronically Signed   By: Genevie Ann M.D.   On: 12/02/2014 16:45   Ct Abdomen Pelvis W Contrast  11/25/2014   CLINICAL DATA:  69 year old male with history of mantle cell lymphoma recently treated with  chemotherapy.  EXAM: CT ABDOMEN AND PELVIS WITH CONTRAST  TECHNIQUE: Multidetector CT imaging of the abdomen and pelvis was performed using the standard protocol following bolus administration of intravenous contrast.  CONTRAST:  77mL OMNIPAQUE IOHEXOL 300 MG/ML  SOLN  COMPARISON:  CT of the abdomen and pelvis 11/01/2014.  FINDINGS: Lower chest: Retrocrural lymph nodes are again noted bilaterally, largest of which on the right measures 1.6 cm in short axis, similar to the prior examination. Small amount of pericardial fluid and/or thickening, unlikely to be of any hemodynamic significance at this time.  Hepatobiliary: Numerous tiny partially calcified gallstones are noted within the gallbladder. No findings to suggest acute cholecystitis at this time. No significant cystic or solid hepatic lesions. No intra or extrahepatic biliary ductal dilatation.  Pancreas: No pancreatic mass. No pancreatic ductal dilatation. No pancreatic or peripancreatic fluid or  inflammatory changes.  Spleen: Spleen remains mildly enlarged, but is smaller than the prior examination, currently measuring up to 13.2 cm in length.  Adrenals/Urinary Tract: Thickening of the left adrenal gland is unchanged. Right adrenal gland is normal. Right kidney is normal in appearance. There is a left-sided double-J ureteral stent in position, with the proximal loop reformed in the interpolar region of the left renal collecting system, and the distal loop performed in the lumen of the urinary bladder. Left kidney is otherwise normal in appearance. No hydroureteronephrosis. Mild diffuse bladder wall thickening, likely in part related to underdistention of the bladder. No discrete bladder mass identified.  Stomach/Bowel: The appearance of the stomach is normal. No pathologic dilatation of small bowel or colon. Normal appendix.  Vascular/Lymphatic: Extensive lymphadenopathy is again noted throughout the abdomen and pelvis. The bulkiest retroperitoneal node or  nodal conglomerate is in the left para-aortic nodal station immediately inferior to the left renal hilum, and this has decreased in size compared to the prior study, currently measuring 6.4 x 5.3 cm (image 25 of series 2), as compared with 8.8 x 6.9 cm on prior exam 11/09/2014. Bulky pelvic adenopathy is again noted bilaterally, generally similar to the prior study, with index lymph node in the left external iliac nodal station (image 57 of series 2) only slightly smaller than the prior examination, currently measuring 3.6 x 5.0 cm (previously 4.2 x 5.1 cm). However, some lymph nodes are larger than the prior examination, including a right external iliac lymph node, which currently measures 4.9 x 3.5 cm (image 59 of series 2) as compared with 4.7 x 3.0 cm on the prior study. Centrally low-attenuation presumably necrotic lymph node in the left common femoral nodal station measuring 3.4 x 4.8 cm. Extensive atherosclerosis throughout the abdominal and pelvic vasculature, without evidence of aneurysm or dissection.  Reproductive: Coarse calcifications are noted in the prostate gland (nonspecific). Seminal vesicles are unremarkable in appearance.  Other: No significant volume of ascites.  No pneumoperitoneum.  Musculoskeletal: Status post L4 laminectomy. There are no aggressive appearing lytic or blastic lesions noted in the visualized portions of the skeleton.  IMPRESSION: 1. Today's study generally demonstrates slight positive response to therapy, with generalized decrease of lymphadenopathy in the abdomen and pelvis. However, there several lymph nodes which are essentially unchanged, and some lymph nodes which are slightly increased compared to the prior examination, as discussed above. 2. Additional findings, as above, similar to the prior examination.   Electronically Signed   By: Vinnie Langton M.D.   On: 11/25/2014 13:12   Ct Abdomen Pelvis W Contrast  11/09/2014   CLINICAL DATA:  Diffuse abdominal pain.   Chemotherapy yesterday.  EXAM: CT ABDOMEN AND PELVIS WITH CONTRAST  TECHNIQUE: Multidetector CT imaging of the abdomen and pelvis was performed using the standard protocol following bolus administration of intravenous contrast.  CONTRAST:  123mL OMNIPAQUE IOHEXOL 300 MG/ML  SOLN  COMPARISON:  10/23/2014  FINDINGS: BODY WALL: Partly visible left lower axillary lymphadenopathy, recently evaluated by chest CT 10/30/2014.  There is bulky bilateral inguinal lymphadenopathy which has increased from 10/23/2014, with left inguinal node on image 88 measuring 31 mm short axis, previously 23 mm  LOWER CHEST: Reticular opacities in the lower lungs, greater on the right, is favored atelectasis. No definitive pneumonia. Lower thoracic lymphadenopathy has increased, with a left retrocrural lymph node currently 15 mm on image 23, previously 10 mm.  ABDOMEN/PELVIS:  Liver: No focal abnormality.  Biliary: Cholelithiasis. The gallbladder is distended but there  is no inflammatory wall thickening.  Pancreas: Unremarkable.  Spleen: Mild splenomegaly, 15 cm in craniocaudal span. This is increased by approximately 4 cm since previous.  Adrenals: Negative.  Kidneys and ureters: Moderate bilateral hydroureteronephrosis to the level of the pelvis. This is despite an internal ureteral stent on the left which is well positioned. Symmetric renal enhancement.  Bladder: Thickening of the bladder wall, preferentially to the left, stable from previous. This is indeterminate and will be assessed on followup imaging.  Reproductive: No pathologic findings.  Bowel: No obstruction. No inflammatory bowel wall thickening  Retroperitoneum: There is bulky and diffuse retroperitoneal lymphadenopathy. There has been significant and rapid increased from previous. Index left periaortic lymph node on image 39 measures 88 x 69 mm (previously 59 x 35 mm) on image 39). Left pelvic sidewall lymph node that was previously measured is on image 71 and stable at 58 x 41  mm. There is new enlargement of upper abdominal retroperitoneal lymph nodes . Some cystic change, presumably treatment related, present in the largest nodes, especially in the left pelvis.  Vascular: Mass effect on the left iliac veins by lymphadenopathy, with progressive subcutaneous edema in the left upper thigh.  OSSEOUS: No acute abnormalities.  IMPRESSION: 1. Rapid progression of lymphoma with marked enlargement of lymph nodes compared to 10/23/2014. 2. Bilateral hydroureteronephrosis, new on the right and unchanged on the left (despite a well-positioned left internal ureteral stent). 3. Progressive edema in the upper left thigh. Given severe compression of the left iliac vein by lymphadenopathy, consider Doppler to evaluate for DVT.   Electronically Signed   By: Monte Fantasia M.D.   On: 11/09/2014 02:13   US Renal  11/30/2014   CLINICAL DATA:  Lymphoma, urinary tract infection, previous placement of a left ureteral stent for obstructive lymphadenopathy and mass effect. Followup  EXAM: RENAL / URINARY TRACT ULTRASOUND COMPLETE  COMPARISON:  CT 11/25/2014  FINDINGS: Right Kidney:  Length: 12.4 cm. No mass or hydronephrosis. Mild cortical atrophy with increased cortical echogenicity.  Left Kidney:  Length: 14.3 cm. Improvement in now mild hydronephrosis. Mild left renal cortical thinning and increased echogenicity. Stent position not well visualized.  Bladder:  Concentric bladder wall thickening, 0.9 cm.  IMPRESSION: Improvement in now mild left hydronephrosis compared to the dissimilar prior exam.  Bilateral increased renal cortical echogenicity suggesting medical renal disease with mild degree of cortical atrophy.  Concentric bladder wall thickening which is nonspecific and may be seen with urinary tract infection/ cystitis, infiltrative processes including lymphomatous involvement, or treatment effect.   Electronically Signed   By: Conchita Paris M.D.   On: 11/30/2014 11:24   [place] Extrem Low Left  Comp  11/09/2014   CLINICAL DATA:  Left calf swelling and lower extremity pain. History of lymphoma.  EXAM: Left LOWER EXTREMITY VENOUS DOPPLER ULTRASOUND  TECHNIQUE: Gray-scale sonography with graded compression, as well as color Doppler and duplex ultrasound were performed to evaluate the lower extremity deep venous systems from the level of the common femoral vein and including the common femoral, femoral, profunda femoral, popliteal and calf veins including the posterior tibial, peroneal and gastrocnemius veins when visible. The superficial great saphenous vein was also interrogated. Spectral Doppler was utilized to evaluate flow at rest and with distal augmentation maneuvers in the common femoral, femoral and popliteal veins.  COMPARISON:  None.  FINDINGS: Contralateral Common Femoral Vein: Respiratory phasicity is normal and symmetric with the symptomatic side. No evidence of thrombus. Normal compressibility.  Common Femoral Vein: No evidence  of thrombus. Normal compressibility, respiratory phasicity and response to augmentation.  Saphenofemoral Junction - Superficial Great Saphenous Vein: Due to patient pain, the patient requested that the examination be terminated therefore the superficial femoral vein through popliteal trifurcation could not be evaluated.  IMPRESSION: No evidence of deep venous thrombosis involving the upper common femoral vein. The patient requested that the examination be terminated and did not allow the sonographer to examine the lower extremity venous system from the level of the saphenous femoral junction through the popliteal trifurcation.   Electronically Signed   By: Conchita Paris M.D.   On: 11/09/2014 09:40   Dg Chest Portable 1 View  11/29/2014   CLINICAL DATA:  Altered mental status. Possible reaction to chemotherapy.  EXAM: PORTABLE CHEST - 1 VIEW  COMPARISON:  11/15/2014  FINDINGS: Mild-to-moderate enlargement of the cardiopericardial silhouette with cephalization of  blood flow. Faint Kerley B-lines noted at the right lung base, but no airspace edema.  Right IJ Port-A-Cath tip: SVC.  Tortuous thoracic aorta.  Deformity from prior surgical neck fracture the right humerus which appears healed.  IMPRESSION: Mild to moderate enlargement of the cardiopericardial silhouette with faint interstitial edema. No airspace opacity identified.   Electronically Signed   By: Van Clines M.D.   On: 11/29/2014 13:31   Dg Chest Port 1 View  11/15/2014   CLINICAL DATA:  Shortness of breath and cough.  Weakness.  EXAM: PORTABLE CHEST - 1 VIEW  COMPARISON:  11/08/2014  FINDINGS: Tip of the right chest port remains in the SVC. The heart is at the upper limits normal in size. There are increased perihilar markings, progressed from prior exam. Increased right basilar atelectasis, now with mild left basilar atelectasis. No definite pleural effusion or pneumothorax.  IMPRESSION: Increased perihilar markings concerning pulmonary edema. Increased right basilar atelectasis in development of mild left basilar atelectasis.   Electronically Signed   By: Jeb Levering M.D.   On: 11/15/2014 04:43   Dg Chest Port 1 View  11/08/2014   CLINICAL DATA:  Acute onset of sepsis.  Initial encounter.  EXAM: PORTABLE CHEST - 1 VIEW  COMPARISON:  Chest radiograph performed 11/12/2013, and CTA of the chest performed 10/30/2014  FINDINGS: The lungs are well-aerated. Mild vascular congestion is noted, with mild right basilar atelectasis. There is no evidence of pleural effusion or pneumothorax.  The cardiomediastinal silhouette is within normal limits. No acute osseous abnormalities are seen. A right-sided chest port is noted ending about the mid SVC.  IMPRESSION: Mild vascular congestion, with mild right basilar atelectasis. Lungs otherwise clear.   Electronically Signed   By: Garald Balding M.D.   On: 11/08/2014 23:06   Dg Abd Portable 1v  12/05/2014   CLINICAL DATA:  OG tube placement.  EXAM: PORTABLE  ABDOMEN - 1 VIEW  COMPARISON:  Single view of the abdomen earlier this same day.  FINDINGS: OG tube is not visualized with the tip just within the stomach.mm. Recommend advancement of 5 cm. The side port is in the distal esophagus.  IMPRESSION: OG tube tip is in the stomach with the side port in the esophagus. Recommend advancement of 5 cm.   Electronically Signed   By: Inge Rise M.D.   On: 12/05/2014 16:49   Dg Abd Portable 1v  12/05/2014   CLINICAL DATA:  NG tube placement.  EXAM: PORTABLE ABDOMEN - 1 VIEW  COMPARISON:  None.  FINDINGS: No NG tube is visualized. Left double-J ureteral stent is noted. Contrast material in the  distal colon noted.  IMPRESSION: As above.   Electronically Signed   By: Inge Rise M.D.   On: 12/05/2014 16:38     CBC  Recent Labs Lab 12/02/14 0353 12/03/14 0437 12/03/14 0633 12/04/14 0839 12/05/14 0530 12/06/14 0437  WBC 6.3 3.6* 3.7* 2.7* 1.7* 1.0*  HGB 7.7* 6.8* 7.3* 7.2* 7.5* 7.2*  HCT 23.8* 20.8* 22.4* 22.2* 23.2* 21.6*  PLT 123*  --  57* 24* 13* 9*  MCV 86.2 86.1 87.8 87.0 86.8 86.6  MCH 28.0 28.3 28.4 28.2 28.1 28.9  MCHC 32.5 32.9 32.4 32.5 32.4 33.4  RDW 19.0* 19.0* 19.5* 20.0* 20.1* 20.3*  LYMPHSABS  --   --   --  1.0  --   --   MONOABS  --   --   --  0.1*  --   --   EOSABS  --   --   --  0.1  --   --   BASOSABS  --   --   --  0.0  --   --     Chemistries   Recent Labs Lab 12/03/14 0437 12/03/14 0633  12/03/14 2359 12/04/14 0459 12/04/14 2000 12/05/14 0530 12/05/14 2016 12/06/14 0437  NA 160* 165*  --   --  163*  --  164* 158* 154*  K 2.9* 3.2*  < >  --  2.8* 3.1* 3.2* 3.7 4.2  CL >130* >130*  --   --  >130*  --  >130* >130* 130*  CO2 18* 17*  --   --  19*  --  18* 19* 18*  GLUCOSE 191* 175*  --   --  182*  --  150* 146* 208*  BUN 72* 69*  --   --  66*  --  57* 48* 42*  CREATININE 2.61* 2.58*  --   --  2.45*  --  2.37* 2.13* 2.04*  CALCIUM 8.3* 8.9  --   --  9.0  --  9.1 8.9 8.6*  MG 2.6* 2.5*  --   --  2.4  --  2.1   --  1.9  AST  --   --   --  20  --   --   --   --  28  ALT  --   --   --  17  --   --   --   --  20  ALKPHOS  --   --   --  141*  --   --   --   --  141*  BILITOT  --   --   --  2.1*  --   --   --   --  2.2*  < > = values in this interval not displayed. ------------------------------------------------------------------------------------------------------------------ estimated creatinine clearance is 34.6 mL/min (by C-G formula based on Cr of 2.04). ------------------------------------------------------------------------------------------------------------------ No results for input(s): HGBA1C in the last 72 hours. ------------------------------------------------------------------------------------------------------------------  Recent Labs  12/06/14 0437  TRIG 262*   ------------------------------------------------------------------------------------------------------------------  Recent Labs  12/03/14 2359  TSH 0.362   ------------------------------------------------------------------------------------------------------------------  Recent Labs  12/03/14 2359 12/04/14 0459  VITAMINB12  --  1237*  FOLATE 22.2  --     Coagulation profile No results for input(s): INR, PROTIME in the last 168 hours.  No results for input(s): DDIMER in the last 72 hours.  Cardiac Enzymes  Recent Labs Lab 11/29/14 2121 11/30/14 0412  TROPONINI 0.05* 0.07*   ------------------------------------------------------------------------------------------------------------------ Invalid input(s): POCBNP    Assessment & Plan  1. Decrease responsiveness, tacycardia, low bp- plt stopped, given benadryl, solumedrol. Pt had cxr will follow, obtain blood cx, add vancomycin to current tx. Start leveophed for low bp, use lopressor for elevated hr.  Son was at bedside     Code Status Orders        Start     Ordered   11/30/14 1029  Do not attempt resuscitation (DNR)   Continuous     Question Answer Comment  In the event of cardiac or respiratory ARREST Do not call a "code blue"   In the event of cardiac or respiratory ARREST Do not perform Intubation, CPR, defibrillation or ACLS   In the event of cardiac or respiratory ARREST Use medication by any route, position, wound care, and other measures to relive pain and suffering. May use oxygen, suction and manual treatment of airway obstruction as needed for comfort.      11/30/14 1028             D  Lab Results  Component Value Date   PLT 9* 12/06/2014     Time Spent in minutes   65min critical care time    Dustin Flock M.D on 12/06/2014 at 7:26 PM  Between 7am to 6pm - Pager - [phone]  After 6pm go to [email or web address] - password [email or web address] [facility]   Office  [phone]

## 2014-12-06 NOTE — Progress Notes (Signed)
[facility] NOTE  Pharmacy Consult for Electrolyte Management and Constipation Management Indication: ICU Status   No Known Allergies  Patient Measurements: Height: 6' (182.9 cm) Weight: 153 lb 10.6 oz (69.7 kg) IBW/kg (Calculated) : 77.6   Vital Signs: Temp: 98.4 F (36.9 C) (06/24 2000) Temp Source: Axillary (06/24 2000) BP: 115/70 mmHg (06/24 2000) Pulse Rate: 101 (06/24 2000) Intake/Output from previous day: 06/23 0701 - 06/24 0700 In: 2372.6 [I.V.:1592.6; Blood:210; NG/GT:520; IV Piggyback:50] Out: 2200 [Urine:2200] Intake/Output from this shift:   Vent settings for last 24 hours:    Labs:  Recent Labs  12/03/14 2359  12/04/14 0459 12/04/14 0839 12/05/14 0530 12/05/14 2016 12/06/14 0437  WBC  --   --   --  2.7* 1.7*  --  1.0*  HGB  --   --   --  7.2* 7.5*  --  7.2*  HCT  --   --   --  22.2* 23.2*  --  21.6*  PLT  --   --   --  24* 13*  --  9*  CREATININE  --   < > 2.45*  --  2.37* 2.13* 2.04*  MG  --   --  2.4  --  2.1  --  1.9  PHOS  --   --  3.4  --  2.5  --  2.4*  ALBUMIN 2.0*  --   --   --   --   --  2.2*  PROT 5.9*  --   --   --   --   --  5.9*  AST 20  --   --   --   --   --  28  ALT 17  --   --   --   --   --  20  ALKPHOS 141*  --   --   --   --   --  141*  BILITOT 2.1*  --   --   --   --   --  2.2*  BILIDIR 1.2*  --   --   --   --   --   --   IBILI 0.9  --   --   --   --   --   --   < > = values in this interval not displayed. Estimated Creatinine Clearance: 34.6 mL/min (by C-G formula based on Cr of 2.04).   Recent Labs  12/06/14 1102 12/06/14 1501 12/06/14 1818  GLUCAP 173* 165* 149*    Microbiology: Recent Results (from the past 720 hour(s))  Blood Culture (routine x 2)     Status: None   Collection Time: 11/08/14 10:21 PM  Result Value Ref Range Status   Specimen Description BLOOD  Final   Special Requests NONE  Final   Culture NO GROWTH 5 DAYS  Final   Report Status 11/13/2014 FINAL  Final  Blood Culture  (routine x 2)     Status: None   Collection Time: 11/08/14 10:22 PM  Result Value Ref Range Status   Specimen Description BLOOD  Final   Special Requests NONE  Final   Culture NO GROWTH 5 DAYS  Final   Report Status 11/13/2014 FINAL  Final  Urine culture     Status: None   Collection Time: 11/09/14  2:30 AM  Result Value Ref Range Status   Specimen Description URINE, RANDOM  Final   Special Requests NONE  Final   Culture NO GROWTH 1 DAY  Final   Report Status 11/10/2014 FINAL  Final  Culture, blood (single)     Status: None   Collection Time: 11/29/14 11:09 AM  Result Value Ref Range Status   Specimen Description BLOOD  Final   Special Requests NONE  Final   Culture  Setup Time   Final    GRAM NEGATIVE RODS AEROBIC BOTTLE ONLY CRITICAL RESULT CALLED TO, READ BACK BY AND VERIFIED WITH: SANDRA VORBA ON 11/30/14 AT 0825 BY JEF    Culture KLEBSIELLA OXYTOCA AEROBIC BOTTLE ONLY   Final   Report Status 12/04/2014 FINAL  Final   Organism ID, Bacteria KLEBSIELLA OXYTOCA  Final      Susceptibility   Klebsiella oxytoca - MIC*    AMPICILLIN >=32 RESISTANT Resistant     CEFTAZIDIME <=1 SENSITIVE Sensitive     CEFAZOLIN >=64 RESISTANT Resistant     CEFTRIAXONE <=1 SENSITIVE Sensitive     CIPROFLOXACIN <=0.25 SENSITIVE Sensitive     GENTAMICIN <=1 SENSITIVE Sensitive     IMIPENEM <=0.25 SENSITIVE Sensitive     TRIMETH/SULFA <=20 SENSITIVE Sensitive     CEFOXITIN <=4 SENSITIVE Sensitive     * KLEBSIELLA OXYTOCA  Urine culture     Status: None   Collection Time: 11/29/14 11:22 AM  Result Value Ref Range Status   Specimen Description URINE, CLEAN CATCH  Final   Special Requests NONE  Final   Culture >=100,000 COLONIES/mL KLEBSIELLA OXYTOCA  Final   Report Status 12/01/2014 FINAL  Final   Organism ID, Bacteria KLEBSIELLA OXYTOCA  Final      Susceptibility   Klebsiella oxytoca - MIC*    AMPICILLIN >=32 RESISTANT Resistant     CEFAZOLIN >=64 RESISTANT Resistant     CEFTRIAXONE 4  SENSITIVE Sensitive     CIPROFLOXACIN <=0.25 SENSITIVE Sensitive     GENTAMICIN <=1 SENSITIVE Sensitive     IMIPENEM <=0.25 SENSITIVE Sensitive     NITROFURANTOIN <=16 SENSITIVE Sensitive     TRIMETH/SULFA <=20 SENSITIVE Sensitive     CEFOXITIN <=4 SENSITIVE Sensitive     * >=100,000 COLONIES/mL KLEBSIELLA OXYTOCA  MRSA PCR Screening     Status: None   Collection Time: 11/29/14 12:13 PM  Result Value Ref Range Status   MRSA by PCR NEGATIVE NEGATIVE Final    Comment:        The GeneXpert MRSA Assay (FDA approved for NASAL specimens only), is one component of a comprehensive MRSA colonization surveillance program. It is not intended to diagnose MRSA infection nor to guide or monitor treatment for MRSA infections.   Culture, blood (routine x 2)     Status: None   Collection Time: 11/29/14  1:19 PM  Result Value Ref Range Status   Specimen Description BLOOD  Final   Special Requests Immunocompromised  Final   Culture NO GROWTH 5 DAYS  Final   Report Status 12/04/2014 FINAL  Final  Urine culture     Status: None   Collection Time: 11/30/14 10:12 AM  Result Value Ref Range Status   Specimen Description URINE, CATHETERIZED  Final   Special Requests Immunocompromised  Final   Culture NO GROWTH 2 DAYS  Final   Report Status 12/02/2014 FINAL  Final    Medications:  Scheduled:  . acetaminophen  650 mg Oral Once  . antiseptic oral rinse  7 mL Mouth Rinse BID  . aspirin  324 mg Oral Once  . cefTRIAXone (ROCEPHIN)  IV  1 g Intravenous Q24H  . diphenhydrAMINE      .  diphenhydrAMINE  25 mg Intravenous Once  . docusate  50 mg Oral BID  . filgrastim  480 mcg Subcutaneous Once  . filgrastim  480 mcg Subcutaneous Daily  . insulin aspart  1-3 Units Subcutaneous Q6H  . lactulose  30 g Oral BID  . methylPREDNISolone (SOLU-MEDROL) injection  60 mg Intravenous Once  . methylPREDNISolone sodium succinate      . piperacillin-tazobactam (ZOSYN)  IV  4.5 g Intravenous 3 times per day  .  scopolamine  1 patch Transdermal Q72H  . sennosides  5 mL Oral BID  . sodium chloride  3 mL Intravenous Q12H  . thiamine IV  100 mg Intravenous Daily  . vancomycin  1,000 mg Intravenous STAT  . ziprasidone  10 mg Intramuscular QHS   Infusions:  . dextrose 5 % with KCl 20 mEq / L 20 mEq (12/06/14 1900)  . feeding supplement (JEVITY 1.5 CAL/FIBER) 1,000 mL (12/06/14 1044)  . free water    . norepinephrine 4 mg (12/06/14 2017)    Assessment: 69 yo male with progressive mantle cell lymphoma. Patient being treated for electrolyte imbalance and AMS.  Goal of Therapy:  Resolution of Symptoms  Plan:   1. Electrolytes: Potassium has normalized. Decreased potassium in fluid, patient now receiving D5 with 81mEq of potassium at 197mL/hr. Sodium is trending down. Will obtain follow-up labs in am.    2. Constipation: Patient currently ordered lactulose 4mL BID by neuro. Will add docusate 46mL VT BID and senna 8.8mg  VT BID.   3. Delirium: Per neuro patient's delirium has worsened over the day. Please not that diazepam 5mg  IV Q4hr was discontinued after family refused administration prior to 1000 rounds.   Pharmacy will continue to monitor and adjust per consult.  Jefferey Lippmann L 12/06/2014,9:04 PM

## 2014-12-06 NOTE — Progress Notes (Signed)
[facility]                                                                                                                                                                                            Patient Demographics   Caleb Francis, is a 69 y.o. male, DOB - 10-09-1945, CBU:[id number]  Admit date - 11/29/2014   Admitting Physician Aldean Jewett, MD  Outpatient Primary MD for the patient is No PCP Per Patient   Subjective: patient is more alert today  Review of Systems:   Unable to provide  Vitals:   Filed Vitals:   12/06/14 0900 12/06/14 1000 12/06/14 1100 12/06/14 1200  BP: 134/73 132/77 135/75 135/81  Pulse: 126 128 128 131  Temp:   98.5 F (36.9 C)   TempSrc:   Oral   Resp: 30 33 31 37  Height:      Weight:      SpO2: 100% 100% 100% 100%    Wt Readings from Last 3 Encounters:  12/05/14 69.1 kg (152 lb 5.4 oz)  11/27/14 68.2 kg (150 lb 5.7 oz)  11/25/14 70.761 kg (156 lb)     Intake/Output Summary (Last 24 hours) at 12/06/14 1240 Last data filed at 12/06/14 1200  Gross per 24 hour  Intake 2814.58 ml  Output   2825 ml  Net -10.42 ml    Physical Exam:   GENERAL:  pt. Lying in bed in NAD.   HEAD, EYES, EARS, NOSE AND THROAT: Atraumatic, normocephalic. Extraocular muscles are intact. Pupils equal and reactive to light. Sclerae anicteric. No conjunctival injection. No oro-pharyngeal erythema. dry oral Mucosa.  NECK: Supple. There is no jugular venous distention. No bruits, no lymphadenopathy, no thyromegaly.  HEART:  tachycardic. No murmurs, no rubs, no clicks.  LUNGS: Clear to auscultation bilaterally. No rales, wheezes, rhonchi.    ABDOMEN: Soft, flat, nontender, nondistended. Has good bowel sounds. No hepatosplenomegaly appreciated.  EXTREMITIES: No evidence of any cyanosis, clubbing, or peripheral edema.  +2 pedal and radial pulses bilaterally.  NEUROLOGIC: follows commands moves all extremities  spontaneously. No focal motor or sensory deficits. SKIN: Moist and warm with no rashes appreciated.  Psych:  Encephalopathic, agitated at times.      Antibiotics   Anti-infectives    Start     Dose/Rate Route Frequency Ordered Stop   12/01/14 0945  piperacillin-tazobactam (ZOSYN) IVPB 3.375 g  Status:  Discontinued     3.375 g 12.5 mL/hr over 240 Minutes Intravenous 3 times per day 12/01/14 0936 12/03/14 1337   11/30/14 1000  cefTRIAXone (ROCEPHIN) 2 g in dextrose 5 %  50 mL IVPB - Premix    Comments:  Entered per MD progress note   2 g 100 mL/hr over 30 Minutes Intravenous Every 24 hours 11/29/14 2157 12/13/14 2359   11/29/14 1415  cefTRIAXone (ROCEPHIN) 1 g in dextrose 5 % 50 mL IVPB - Premix     1 g 100 mL/hr over 30 Minutes Intravenous  Once 11/29/14 1406 11/29/14 1452   11/29/14 1414  cefTRIAXone (ROCEPHIN) 20 MG/ML IVPB 50 mL    Comments:  MONAR, NELLIE: cabinet override      11/29/14 1414 11/29/14 1424      Medications   Scheduled Meds: . sodium chloride   Intravenous Once  . acetaminophen  650 mg Oral Once  . antiseptic oral rinse  7 mL Mouth Rinse BID  . aspirin  324 mg Oral Once  . cefTRIAXone (ROCEPHIN)  IV  2 g Intravenous Q24H  . filgrastim  480 mcg Subcutaneous Once  . filgrastim  480 mcg Subcutaneous Daily  . insulin aspart  1-3 Units Subcutaneous Q6H  . lactulose  30 g Oral BID  . sodium chloride  3 mL Intravenous Q12H  . thiamine IV  100 mg Intravenous Daily  . ziprasidone  10 mg Intramuscular QHS   Continuous Infusions: . dextrose 5 % with KCl 20 mEq / L 20 mEq (12/06/14 1200)  . feeding supplement (JEVITY 1.5 CAL/FIBER) 1,000 mL (12/06/14 1044)  . free water     PRN Meds:.acetaminophen **OR** acetaminophen, haloperidol lactate, heparin lock flush, heparin lock flush, magnesium hydroxide, morphine injection, sodium chloride, sodium chloride, ziprasidone   Data Review:   Micro Results Recent Results (from the past 240 hour(s))  Culture, blood  (single)     Status: None   Collection Time: 11/29/14 11:09 AM  Result Value Ref Range Status   Specimen Description BLOOD  Final   Special Requests NONE  Final   Culture  Setup Time   Final    GRAM NEGATIVE RODS AEROBIC BOTTLE ONLY CRITICAL RESULT CALLED TO, READ BACK BY AND VERIFIED WITH: SANDRA VORBA ON 11/30/14 AT 0825 BY JEF    Culture KLEBSIELLA OXYTOCA AEROBIC BOTTLE ONLY   Final   Report Status 12/04/2014 FINAL  Final   Organism ID, Bacteria KLEBSIELLA OXYTOCA  Final      Susceptibility   Klebsiella oxytoca - MIC*    AMPICILLIN >=32 RESISTANT Resistant     CEFTAZIDIME <=1 SENSITIVE Sensitive     CEFAZOLIN >=64 RESISTANT Resistant     CEFTRIAXONE <=1 SENSITIVE Sensitive     CIPROFLOXACIN <=0.25 SENSITIVE Sensitive     GENTAMICIN <=1 SENSITIVE Sensitive     IMIPENEM <=0.25 SENSITIVE Sensitive     TRIMETH/SULFA <=20 SENSITIVE Sensitive     CEFOXITIN <=4 SENSITIVE Sensitive     * KLEBSIELLA OXYTOCA  Urine culture     Status: None   Collection Time: 11/29/14 11:22 AM  Result Value Ref Range Status   Specimen Description URINE, CLEAN CATCH  Final   Special Requests NONE  Final   Culture >=100,000 COLONIES/mL KLEBSIELLA OXYTOCA  Final   Report Status 12/01/2014 FINAL  Final   Organism ID, Bacteria KLEBSIELLA OXYTOCA  Final      Susceptibility   Klebsiella oxytoca - MIC*    AMPICILLIN >=32 RESISTANT Resistant     CEFAZOLIN >=64 RESISTANT Resistant     CEFTRIAXONE 4 SENSITIVE Sensitive     CIPROFLOXACIN <=0.25 SENSITIVE Sensitive     GENTAMICIN <=1 SENSITIVE Sensitive     IMIPENEM <=0.25 SENSITIVE  Sensitive     NITROFURANTOIN <=16 SENSITIVE Sensitive     TRIMETH/SULFA <=20 SENSITIVE Sensitive     CEFOXITIN <=4 SENSITIVE Sensitive     * >=100,000 COLONIES/mL KLEBSIELLA OXYTOCA  MRSA PCR Screening     Status: None   Collection Time: 11/29/14 12:13 PM  Result Value Ref Range Status   MRSA by PCR NEGATIVE NEGATIVE Final    Comment:        The GeneXpert MRSA Assay  (FDA approved for NASAL specimens only), is one component of a comprehensive MRSA colonization surveillance program. It is not intended to diagnose MRSA infection nor to guide or monitor treatment for MRSA infections.   Culture, blood (routine x 2)     Status: None   Collection Time: 11/29/14  1:19 PM  Result Value Ref Range Status   Specimen Description BLOOD  Final   Special Requests Immunocompromised  Final   Culture NO GROWTH 5 DAYS  Final   Report Status 12/04/2014 FINAL  Final  Urine culture     Status: None   Collection Time: 11/30/14 10:12 AM  Result Value Ref Range Status   Specimen Description URINE, CATHETERIZED  Final   Special Requests Immunocompromised  Final   Culture NO GROWTH 2 DAYS  Final   Report Status 12/02/2014 FINAL  Final    Radiology Reports Dg Abd Portable 1v  12/05/2014   CLINICAL DATA:  NG tube placement.  EXAM: PORTABLE ABDOMEN - 1 VIEW  COMPARISON:  None.  FINDINGS: No NG tube is visualized. Left double-J ureteral stent is noted. Contrast material in the distal colon noted.  IMPRESSION: As above.   Electronically Signed   By: Inge Rise M.D.   On: 12/05/2014 16:38     CBC  Recent Labs Lab 11/29/14 1318  12/02/14 0353 12/03/14 0437 12/03/14 0633 12/04/14 0839 12/05/14 0530 12/06/14 0437  WBC 9.5  < > 6.3 3.6* 3.7* 2.7* 1.7* 1.0*  HGB 7.0*  < > 7.7* 6.8* 7.3* 7.2* 7.5* 7.2*  HCT 21.9*  < > 23.8* 20.8* 22.4* 22.2* 23.2* 21.6*  PLT 288  < > 123*  --  57* 24* 13* 9*  MCV 86.5  < > 86.2 86.1 87.8 87.0 86.8 86.6  MCH 27.5  < > 28.0 28.3 28.4 28.2 28.1 28.9  MCHC 31.8*  < > 32.5 32.9 32.4 32.5 32.4 33.4  RDW 20.2*  < > 19.0* 19.0* 19.5* 20.0* 20.1* 20.3*  LYMPHSABS 0.4*  --   --   --   --  1.0  --   --   MONOABS 0.2  --   --   --   --  0.1*  --   --   EOSABS 0.0  --   --   --   --  0.1  --   --   BASOSABS 0.0  --   --   --   --  0.0  --   --   < > = values in this interval not displayed.  Chemistries   Recent Labs Lab  11/29/14 1318  12/03/14 0437 12/03/14 5993  12/03/14 2359 12/04/14 0459 12/04/14 2000 12/05/14 0530 12/05/14 2016 12/06/14 0437  NA 139  < > 160* 165*  --   --  163*  --  164* 158* 154*  K 4.7  < > 2.9* 3.2*  < >  --  2.8* 3.1* 3.2* 3.7 4.2  CL 112*  < > >130* >130*  --   --  >130*  --  >  130* >130* 130*  CO2 19*  < > 18* 17*  --   --  19*  --  18* 19* 18*  GLUCOSE 127*  < > 191* 175*  --   --  182*  --  150* 146* 208*  BUN 25*  < > 72* 69*  --   --  66*  --  57* 48* 42*  CREATININE 1.21  < > 2.61* 2.58*  --   --  2.45*  --  2.37* 2.13* 2.04*  CALCIUM 8.7*  < > 8.3* 8.9  --   --  9.0  --  9.1 8.9 8.6*  MG  --   --  2.6* 2.5*  --   --  2.4  --  2.1  --  1.9  AST 43*  --   --   --   --  20  --   --   --   --  28  ALT 42  --   --   --   --  17  --   --   --   --  20  ALKPHOS 268*  --   --   --   --  141*  --   --   --   --  141*  BILITOT 0.6  --   --   --   --  2.1*  --   --   --   --  2.2*  < > = values in this interval not displayed. ------------------------------------------------------------------------------------------------------------------ estimated creatinine clearance is 34.3 mL/min (by C-G formula based on Cr of 2.04). ------------------------------------------------------------------------------------------------------------------ No results for input(s): HGBA1C in the last 72 hours. ------------------------------------------------------------------------------------------------------------------  Recent Labs  12/06/14 0437  TRIG 262*   ------------------------------------------------------------------------------------------------------------------  Recent Labs  12/03/14 2359  TSH 0.362   ------------------------------------------------------------------------------------------------------------------  Recent Labs  12/03/14 2359 12/04/14 0459  VITAMINB12  --  1237*  FOLATE 22.2  --     Coagulation profile  Recent Labs Lab 11/29/14 1318  INR 1.33     No results for input(s): DDIMER in the last 72 hours.  Cardiac Enzymes  Recent Labs Lab 11/29/14 1549 11/29/14 2121 11/30/14 0412  TROPONINI 0.06* 0.05* 0.07*   ------------------------------------------------------------------------------------------------------------------ Invalid input(s): POCBNP  Assessment & Plan   #1 Sepsis: Patient's urine culture and blood cultures are positive for Klebsiella.  - Hemodynamically stable, afebrile. Continue IV ceftriaxone.  - will need a total of 14 days of IV abx therapy.   #2 altered mental status - This is likely due to metabolic encephalopathy due to sepsis and urinary tract infection/ARF/Hypernatremia.  - CT head/MRI brain showing no evidence of acute pathology.  Seen by Neurology and as per them this is likely metabolic encephalopathy too.  - cont. D5W for hypernatremia, cont. IV abx for UTI and follow mental status.  - avoid Benzo's. keep Off Precedex gtt and cont. PRN Geodon, Valium for now.   #3 chest pain: No EKG changes to suggest ischemia.  Continue to monitor for any further cardiac symptoms  #4 Klebsiella urinary tract infection: Continue IV ceftriaxone.   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 Patient follows with Dr. Mike Gip.    #6. Panctytopenia: Due to chemotherapy and Mantle cell lymphoma, status post 2 units of packed RBCs and hemoglobin stable and will continue to monitor. - follow daily counts.    #7. Acute renal failure with hypernatremia: Appreciate nephrology input. -Continue D5W - Nephro following.  #8 Hypokalemia - continue supplementation when necessary #9 Thrombocytopenia -  Likely due to sepsis and underlying cancer with recent chemotherapy. Patient will need another unit of platelet transfusion.  #10. Sinus tachycardia: Possibly related to underlying dehydration/hypernatremia. Patient will continue telemetry monitoring.  Prognosis seems to be very poor palliative care consult  appreciated patient is DO NOT RESUSCITATE     Code Status Orders        Start     Ordered   11/29/14 1731  Full code   Continuous     11/29/14 1730     DNR  DVT Prophylaxis   SCD's   Lab Results  Component Value Date   PLT 9* 12/06/2014     Total Time Spent in minutes - 29 min    Elbridge Magowan M.D on 12/06/2014 at 12:40 PM  Between 7am to 6pm - Pager - [phone]  After 6pm go to [email or web address] - password [email or web address] [facility]   Office  [phone]

## 2014-12-06 NOTE — Progress Notes (Signed)
   12/06/14 1530  Clinical Encounter Type  Visited With Patient and family together;Health care provider  Visit Type Initial  Consult/Referral To Chaplain  Spiritual Encounters  Spiritual Needs Emotional  Stress Factors  Patient Stress Factors None identified  Family Stress Factors None identified  Chaplain rounded and visited patient. Offered a compassionate presence and support as applicable. Chaplain Dharma Pare A. Meital Riehl Ext. [phone]

## 2014-12-06 NOTE — Progress Notes (Signed)
Neurology Note  S: Pt having a transfusion reaction now after platelets vs. Sepsis.  Previous today, pt had good day and was closer to baseline.   ROS unobtainable secondary to mental status  O: AF 80-90's/40-50's 130-140's 20-30's NL weight, severe distress Normocephalic, oropharynx clear Coarse BS, tachypneic Tachycardic, no murmur No C/C/E  Lethargic, agitated, does not follow PERRLA, EOMI, face symmetric Moves all ext equally  A/P: 1. Encephalopathy- worsened again today; Likely a component of ICU psychosis as well. Pt also has complicating nutritional deficit and electrolyte abnormalities but likely worse due to infections -  Change rocephin to Zosyn 3.375mg  q6h and Vanc 1gm q12h -  Pending blood cultures and chest x-ray - continue Geodon 10mg  qHS regardless - continue lactulose 30gm BID - Continue thiamine 100mg  IV daily - PRN Geodon 10mg  q12h PRN agitation - ** Lights on during day and up in chair ** - Needs nutrition which would be better through NG tube - Will follow

## 2014-12-06 NOTE — Progress Notes (Signed)
[facility]  ROUNDING NOTE   Subjective:  Remains lethargic at this time.  Na down to 154. Cr down to 2.04. Good UOP noted.  Objective:  Vital signs in last 24 hours:  Temp:  [97.4 F (36.3 C)-98.8 F (37.1 C)] 98.8 F (37.1 C) (06/24 0800) Pulse Rate:  [81-132] 132 (06/24 0800) Resp:  [17-32] 27 (06/24 0800) BP: (96-144)/(59-91) 131/73 mmHg (06/24 0800) SpO2:  [100 %] 100 % (06/24 0800) Weight:  [69.1 kg (152 lb 5.4 oz)] 69.1 kg (152 lb 5.4 oz) (06/23 1400)  Weight change:  Filed Weights   11/29/14 1222 12/05/14 1400  Weight: 73.483 kg (162 lb) 69.1 kg (152 lb 5.4 oz)    Intake/Output: I/O last 3 completed shifts: In: 3491 [I.V.:2711; Blood:210; NG/GT:520; IV Piggyback:50] Out: 2875 [Urine:2875]   Intake/Output this shift:  Total I/O In: 55 [NG/GT:55] Out: 275 [Urine:275]  Physical Exam: General: lethargic  Head: Normocephalic, atraumatic. Dry oral mucosal membranes, NG in place  Eyes: Anicteric  Neck: Supple, trachea midline  Lungs:  Clear to auscultation normal effort  Heart: S1S2 tachycardic  Abdomen:  Soft, nontender, slight distension  Extremities: no peripheral edema.  Neurologic: Lethargic but arousable  Skin: No rashes  Access: none    Basic Metabolic Panel:  Recent Labs Lab 12/03/14 0437 12/03/14 0630  12/04/14 0459 12/04/14 2000 12/05/14 0530 12/05/14 2016 12/06/14 0437  NA 160* 165*  --  163*  --  164* 158* 154*  K 2.9* 3.2*  < > 2.8* 3.1* 3.2* 3.7 4.2  CL >130* >130*  --  >130*  --  >130* >130* 130*  CO2 18* 17*  --  19*  --  18* 19* 18*  GLUCOSE 191* 175*  --  182*  --  150* 146* 208*  BUN 72* 69*  --  66*  --  57* 48* 42*  CREATININE 2.61* 2.58*  --  2.45*  --  2.37* 2.13* 2.04*  CALCIUM 8.3* 8.9  --  9.0  --  9.1 8.9 8.6*  MG 2.6* 2.5*  --  2.4  --  2.1  --  1.9  PHOS 4.3 4.4  --  3.4  --  2.5  --  2.4*  < > = values in this interval not displayed.  Liver Function Tests:  Recent Labs Lab 11/29/14 1009  11/29/14 1318 12/03/14 0633 12/03/14 2359 12/06/14 0437  AST 44* 43*  --  20 28  ALT 45 42  --  17 20  ALKPHOS 283* 268*  --  141* 141*  BILITOT 0.5 0.6  --  2.1* 2.2*  PROT 6.7 6.4*  --  5.9* 5.9*  ALBUMIN 2.4* 2.4* 2.1* 2.0* 2.2*   No results for input(s): LIPASE, AMYLASE in the last 168 hours.  Recent Labs Lab 11/29/14 1318 12/03/14 2247  AMMONIA 31 53*    CBC:  Recent Labs Lab 11/29/14 1318  12/02/14 0353 12/03/14 0437 12/03/14 0633 12/04/14 0839 12/05/14 0530 12/06/14 0437  WBC 9.5  < > 6.3 3.6* 3.7* 2.7* 1.7* 1.0*  NEUTROABS 8.9*  --   --   --   --  1.6  --   --   HGB 7.0*  < > 7.7* 6.8* 7.3* 7.2* 7.5* 7.2*  HCT 21.9*  < > 23.8* 20.8* 22.4* 22.2* 23.2* 21.6*  MCV 86.5  < > 86.2 86.1 87.8 87.0 86.8 86.6  PLT 288  < > 123*  --  57* 24* 13* 9*  < > = values in this interval  not displayed.  Cardiac Enzymes:  Recent Labs Lab 11/29/14 1318 11/29/14 1549 11/29/14 2121 11/30/14 0412  TROPONINI 0.06* 0.06* 0.05* 0.07*    BNP: Invalid input(s): POCBNP  CBG:  Recent Labs Lab 12/05/14 0001 12/05/14 1156 12/05/14 1822 12/06/14 0550 12/06/14 0749  GLUCAP 139* 159* 172* 155* 158*    Microbiology: Results for orders placed or performed during the hospital encounter of 11/29/14  MRSA PCR Screening     Status: None   Collection Time: 11/29/14 12:13 PM  Result Value Ref Range Status   MRSA by PCR NEGATIVE NEGATIVE Final    Comment:        The GeneXpert MRSA Assay (FDA approved for NASAL specimens only), is one component of a comprehensive MRSA colonization surveillance program. It is not intended to diagnose MRSA infection nor to guide or monitor treatment for MRSA infections.   Culture, blood (routine x 2)     Status: None   Collection Time: 11/29/14  1:19 PM  Result Value Ref Range Status   Specimen Description BLOOD  Final   Special Requests Immunocompromised  Final   Culture NO GROWTH 5 DAYS  Final   Report Status 12/04/2014 FINAL  Final   Urine culture     Status: None   Collection Time: 11/30/14 10:12 AM  Result Value Ref Range Status   Specimen Description URINE, CATHETERIZED  Final   Special Requests Immunocompromised  Final   Culture NO GROWTH 2 DAYS  Final   Report Status 12/02/2014 FINAL  Final    Coagulation Studies: No results for input(s): LABPROT, INR in the last 72 hours.  Urinalysis: No results for input(s): COLORURINE, LABSPEC, PHURINE, GLUCOSEU, HGBUR, BILIRUBINUR, KETONESUR, PROTEINUR, UROBILINOGEN, NITRITE, LEUKOCYTESUR in the last 72 hours.  Invalid input(s): APPERANCEUR    Imaging: Dg Abd Portable 1v  12/05/2014   CLINICAL DATA:  OG tube placement.  EXAM: PORTABLE ABDOMEN - 1 VIEW  COMPARISON:  Single view of the abdomen earlier this same day.  FINDINGS: OG tube is not visualized with the tip just within the stomach.mm. Recommend advancement of 5 cm. The side port is in the distal esophagus.  IMPRESSION: OG tube tip is in the stomach with the side port in the esophagus. Recommend advancement of 5 cm.   Electronically Signed   By: Inge Rise M.D.   On: 12/05/2014 16:49   Dg Abd Portable 1v  12/05/2014   CLINICAL DATA:  NG tube placement.  EXAM: PORTABLE ABDOMEN - 1 VIEW  COMPARISON:  None.  FINDINGS: No NG tube is visualized. Left double-J ureteral stent is noted. Contrast material in the distal colon noted.  IMPRESSION: As above.   Electronically Signed   By: Inge Rise M.D.   On: 12/05/2014 16:38     Medications:   . dextrose 5 % with KCl 20 mEq / L    . feeding supplement (JEVITY 1.5 CAL/FIBER)    . free water     . acetaminophen  650 mg Oral Once  . antiseptic oral rinse  7 mL Mouth Rinse BID  . aspirin  324 mg Oral Once  . cefTRIAXone (ROCEPHIN)  IV  2 g Intravenous Q24H  . diazepam  5 mg Intravenous Q4H  . filgrastim  480 mcg Subcutaneous Once  . filgrastim  480 mcg Subcutaneous Daily  . insulin aspart  1-3 Units Subcutaneous Q6H  . lactulose  30 g Oral BID  . magnesium  sulfate 1 - 4 g bolus IVPB  1 g Intravenous  Once  . sodium chloride  3 mL Intravenous Q12H  . thiamine IV  100 mg Intravenous Daily  . ziprasidone  10 mg Intramuscular QHS   acetaminophen **OR** acetaminophen, haloperidol lactate, heparin lock flush, heparin lock flush, magnesium hydroxide, morphine injection, sodium chloride, sodium chloride, ziprasidone  Assessment/ Plan:  59 AAM with progressive mantle cell lymphoma, hx left sided hydronephrosis and hydroureter. S/p ureteral stent placement    1.  Acute renal failure due to ATN. 2.  Sepsis with klebsiella oxytoca. 3.  Hypernatremia. 4.  Hypokalemia.  Plan:  Na correcting as expected at present, acute renal failure also improving.  Good UOP noted. K repletion has also been effective.  Will continue D5W but will increase rate to 175cc/hr.  Continue ceftriaxone for treatment of sepsis.  Overall reamins critically ill and has guarded prognosis.   LOS: 7 Leightyn Cina 6/24/20169:04 AM

## 2014-12-06 NOTE — Progress Notes (Signed)
Irradiated Platelet transfusion started at 1812. At 1830 the transfusion was stopped for suspected reaction. Pt HR 140-160, tachypneic with increased upper airway congestion, tremor and less alert, moaning . At 1840,  Benadryl 25mg  iv and solumedrol 60mg  iv given. Dr Mike Gip here and Dr Posey Pronto on his way. Blood bank notified.  Platelets sent to lab.  See transfusion record.  Approximately 4ml was delivered according to iv pump.  Dr Tamala Julian neurology also in to see on his rounds. At time of reaction, son was here and thinks he remembered a time when his father had a reaction to platelets that involved swelling of his lips.

## 2014-12-06 NOTE — Consult Note (Signed)
Palliative Medicine Inpatient Consult Follow Up Note   Name: Caleb Francis Date: 12/06/2014 MRN: [id number]  DOB: 25-Aug-1945  Referring Physician: Bettey Costa, MD  Palliative Care consult requested for this 69 y.o. male for goals of medical therapy in patient with mantle cell lymphoma, getting chemotherapy, admitted with altered mental status  Caleb Francis is lying in bed in CCU. Calmer and awakens to voice. Uncle at bedside and son at bedside overnight.    REVIEW OF SYSTEMS:  Patient is not able to provide ROS  CODE STATUS: DNR   PAST MEDICAL HISTORY: Past Medical History  Diagnosis Date  . Arthritis   . Hypertension   . RA (rheumatoid arthritis)   . Anemia   . Cancer     lymphoma  . History of nuclear stress test     a. 12/2013: low risk, no sig ischemia, no EKG changes, no artifact, EF 63%  . Chronic kidney disease     PAST SURGICAL HISTORY:  Past Surgical History  Procedure Laterality Date  . Back surgery    . Fracture surgery      ankle  . Portacath placement    . Cystoscopy w/ ureteral stent placement Left 10/24/2014    Procedure: CYSTOSCOPY WITH RETROGRADE PYELOGRAM/URETERAL STENT PLACEMENT;  Surgeon: Irine Seal, MD;  Location: [facility] ORS;  Service: Urology;  Laterality: Left;    Vital Signs: BP 132/77 mmHg  Pulse 128  Temp(Src) 98.8 F (37.1 C) (Oral)  Resp 33  Ht 6' (1.829 m)  Wt 69.1 kg (152 lb 5.4 oz)  BMI 20.66 kg/m2  SpO2 100% Filed Weights   11/29/14 1222 12/05/14 1400  Weight: 73.483 kg (162 lb) 69.1 kg (152 lb 5.4 oz)    Estimated body mass index is 20.66 kg/(m^2) as calculated from the following:   Height as of this encounter: 6' (1.829 m).   Weight as of this encounter: 69.1 kg (152 lb 5.4 oz).  PHYSICAL EXAM: General: Critically ill appearing HEENT: OP clear, poor dentition Neck: Trachea midline  Cardiovascular: regular rhythm, tachycardic Pulmonary/Chest: fair air movemnt ant fields, no audible wheeze Abdominal: Soft, nontender,  hypoactive bowel sounds GU: no suprapubic tenderness Extremities: no edema BLE's Neurological: moves extremities, mumbles words, follows simple commands  Skin: no rashes Psychiatric: calm  LABS: CBC:    Component Value Date/Time   WBC 1.0* 12/06/2014 0437   WBC 6.4 07/17/2014 1121   HGB 7.2* 12/06/2014 0437   HGB 11.8* 07/17/2014 1121   HCT 21.6* 12/06/2014 0437   HCT 35.0* 07/17/2014 1121   PLT 9* 12/06/2014 0437   PLT 263 07/17/2014 1121   MCV 86.6 12/06/2014 0437   MCV 83 07/17/2014 1121   NEUTROABS 1.6 12/04/2014 0839   NEUTROABS 3.4 07/17/2014 1121   LYMPHSABS 1.0 12/04/2014 0839   LYMPHSABS 1.9 07/17/2014 1121   MONOABS 0.1* 12/04/2014 0839   MONOABS 0.8 07/17/2014 1121   EOSABS 0.1 12/04/2014 0839   EOSABS 0.2 07/17/2014 1121   BASOSABS 0.0 12/04/2014 0839   BASOSABS 0.0 07/17/2014 1121   Comprehensive Metabolic Panel:    Component Value Date/Time   NA 154* 12/06/2014 0437   NA 141 07/17/2014 1121   K 4.2 12/06/2014 0437   K 2.8* 07/17/2014 1121   CL 130* 12/06/2014 0437   CL 100 07/17/2014 1121   CO2 18* 12/06/2014 0437   CO2 32 07/17/2014 1121   BUN 42* 12/06/2014 0437   BUN 9 07/17/2014 1121   CREATININE 2.04* 12/06/2014 0437   CREATININE 1.09 07/17/2014  1121   GLUCOSE 208* 12/06/2014 0437   GLUCOSE 105* 07/17/2014 1121   CALCIUM 8.6* 12/06/2014 0437   CALCIUM 8.9 07/17/2014 1121   AST 28 12/06/2014 0437   AST 17 07/17/2014 1121   ALT 20 12/06/2014 0437   ALT 12* 07/17/2014 1121   ALKPHOS 141* 12/06/2014 0437   ALKPHOS 79 07/17/2014 1121   BILITOT 2.2* 12/06/2014 0437   PROT 5.9* 12/06/2014 0437   PROT 7.3 07/17/2014 1121   ALBUMIN 2.2* 12/06/2014 0437   ALBUMIN 3.1* 07/17/2014 1121    IMPRESSION: Caleb Francis is a 69 yo man with PMH of mantle cell lymphoma dx 02/2013 on salvage chemo, HTN, RA, 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 He was hospitalzed 5/27-11/19/14 with abd pain, sepsis, A/CKD. He was discharged to SNF and continued  with chemotherapy. He was readmitted 11/29/14 with altered mental status. Urine and blood cx's + for Klebsiella. Mental status remains poor. MRI without contrast shows no acute change. EEG c/w metabolic encephalopathy. Developed hypernatremia which is slowly correcting. Mental status not yet at baseline.  Pt is more alert this AM. He followed a command for me (stick out tongue). Calm at present and NG able to be placed. TF's started.   Family trying to be at bedside 24/7 to try to keep pt calm. Keep lights on in room, shades up during day.   PLAN: As above    More than 50% of the visit was spent in counseling/coordination of care: YES  Time spent: 25 minutes

## 2014-12-06 NOTE — Progress Notes (Signed)
[facility] Hematology/Oncology Progress Note  Date of admission: 11/29/2014  Hospital day:  12/05/2014  Chief Complaint: Caleb Francis is a 69 y.o. male who was admitted with altered mental status and a urinary tract infection on day 3 of cycle #2 RICE chemotherapy.  Subjective:  Patient remains intermittently agitated and combative.   Social History: The patient is accompanied by his son today.  Allergies: No Known Allergies  Scheduled Medications: . acetaminophen  650 mg Oral Once  . antiseptic oral rinse  7 mL Mouth Rinse BID  . aspirin  324 mg Oral Once  . cefTRIAXone (ROCEPHIN)  IV  2 g Intravenous Q24H  . diazepam  5 mg Intravenous Q4H  . filgrastim  480 mcg Subcutaneous Once  . filgrastim  480 mcg Subcutaneous Daily  . insulin aspart  1-3 Units Subcutaneous Q6H  . lactulose  30 g Oral BID  . sodium chloride  3 mL Intravenous Q12H  . thiamine IV  100 mg Intravenous Daily  . ziprasidone  10 mg Intramuscular QHS    Review of Systems:  Unable to be obtained. Patient poorly responsive and intermittently combative. PERFORMANCE STATUS (ECOG): 4  Physical Exam: Blood pressure 125/71, pulse 110, temperature 97.4 F (36.3 C), temperature source Oral, resp. rate 20, height 6' (1.829 m), weight 152 lb 5.4 oz (69.1 kg), SpO2 100 %.  GENERAL: Chronically ill appearing gentleman lying in bed in the ICU, intermittently flailing his arms. MENTAL STATUS: Predominantly resting comfortably.  Intermittently agiated. Unable to cooperate.[name] HEAD: Thin short graying hair. Normocephalic, atraumatic, face symmetric, no Cushingoid features. EYES: Brown eyes. Pupils equal round and reactive to light and accomodation. No conjunctivitis or scleral icterus. ENT: NG tube in place.  Oropharynx dry. Tongue normal.  RESPIRATORY: Clear to auscultation anteriorly. CARDIOVASCULAR: Regular rate and rhythm without murmur, rub or gallop. ABDOMEN: Soft, non-tender, with  active bowel sounds, and no hepatosplenomegaly. No masses. SKIN: No rashes, ulcers or lesions. EXTREMITIES: No edema, no skin discoloration or tenderness. No palpable cords. NEUROLOGICAL: Moves all extremities. Unable to follow commands..  Results for orders placed or performed during the hospital encounter of 11/29/14 (from the past 48 hour(s))  Glucose, capillary     Status: Abnormal   Collection Time: 12/04/14  5:37 AM  Result Value Ref Range   Glucose-Capillary 167 (H) 65 - 99 mg/dL  CBC with Differential     Status: Abnormal   Collection Time: 12/04/14  8:39 AM  Result Value Ref Range   WBC 2.7 (L) 3.8 - 10.6 K/uL   RBC 2.55 (L) 4.40 - 5.90 MIL/uL   Hemoglobin 7.2 (L) 13.0 - 18.0 g/dL   HCT 22.2 (L) 40.0 - 52.0 %   MCV 87.0 80.0 - 100.0 fL   MCH 28.2 26.0 - 34.0 pg   MCHC 32.5 32.0 - 36.0 g/dL   RDW 20.0 (H) 11.5 - 14.5 %   Platelets 24 (LL) 150 - 440 K/uL    Comment: CRITICAL RESULT CALLED TO, READ BACK BY AND VERIFIED WITH: HP TO CHRIS LARSON_0  ON 12/04/14. PLATELET COUNT CONFIRMED BY SMEAR    Neutrophils Relative % 60% %   Neutro Abs 1.6 1.4 - 6.5 K/uL   Lymphocytes Relative 35% %   Lymphs Abs 1.0 1.0 - 3.6 K/uL   Monocytes Relative 3% %   Monocytes Absolute 0.1 (L) 0.2 - 1.0 K/uL   Eosinophils Relative 2% %   Eosinophils Absolute 0.1 0 - 0.7 K/uL   Basophils Relative 0% %  Basophils Absolute 0.0 0 - 0.1 K/uL  Glucose, capillary     Status: Abnormal   Collection Time: 12/04/14 12:26 PM  Result Value Ref Range   Glucose-Capillary 145 (H) 65 - 99 mg/dL  Glucose, capillary     Status: Abnormal   Collection Time: 12/04/14  6:21 PM  Result Value Ref Range   Glucose-Capillary 162 (H) 65 - 99 mg/dL   Comment 1 Notify RN   Potassium     Status: Abnormal   Collection Time: 12/04/14  8:00 PM  Result Value Ref Range   Potassium 3.1 (L) 3.5 - 5.1 mmol/L  Glucose, capillary     Status: Abnormal   Collection Time: 12/05/14 12:01 AM  Result Value Ref Range    Glucose-Capillary 139 (H) 65 - 99 mg/dL   Comment 1 Notify RN   Basic metabolic panel     Status: Abnormal   Collection Time: 12/05/14  5:30 AM  Result Value Ref Range   Sodium 164 (HH) 135 - 145 mmol/L    Comment: CRITICAL RESULT CALLED TO, READ BACK BY AND VERIFIED WITH: OLGA MCLEOD 9030 12/05/2014 MPG    Potassium 3.2 (L) 3.5 - 5.1 mmol/L   Chloride >130 (HH) 101 - 111 mmol/L    Comment: CRITICAL RESULT CALLED TO, READ BACK BY AND VERIFIED WITH OLGA MCLEOD @ 0629 6.23.16 MPG    CO2 18 (L) 22 - 32 mmol/L   Glucose, Bld 150 (H) 65 - 99 mg/dL   BUN 57 (H) 6 - 20 mg/dL   Creatinine, Ser 2.37 (H) 0.61 - 1.24 mg/dL   Calcium 9.1 8.9 - 10.3 mg/dL   GFR calc non Af Amer 27 (L) >60 mL/min   GFR calc Af Amer 31 (L) >60 mL/min    Comment: (NOTE) The eGFR has been calculated using the CKD EPI equation. This calculation has not been validated in all clinical situations. eGFR's persistently <60 mL/min signify possible Chronic Kidney Disease.    Anion gap NOT CALCULATED 5 - 15  CBC     Status: Abnormal   Collection Time: 12/05/14  5:30 AM  Result Value Ref Range   WBC 1.7 (L) 3.8 - 10.6 K/uL   RBC 2.68 (L) 4.40 - 5.90 MIL/uL   Hemoglobin 7.5 (L) 13.0 - 18.0 g/dL   HCT 23.2 (L) 40.0 - 52.0 %   MCV 86.8 80.0 - 100.0 fL   MCH 28.1 26.0 - 34.0 pg   MCHC 32.4 32.0 - 36.0 g/dL   RDW 20.1 (H) 11.5 - 14.5 %   Platelets 13 (LL) 150 - 440 K/uL    Comment: PLATELET COUNT CONFIRMED BY SMEAR CRITICAL VALUE NOTED.  VALUE IS CONSISTENT WITH PREVIOUSLY REPORTED AND CALLED VALUE. CRITICAL RESULT CALLED TO, READ BACK BY AND VERIFIED WITH: OLGA MCLO ON 12/05/14 AT 0641 BY TB.   Magnesium     Status: None   Collection Time: 12/05/14  5:30 AM  Result Value Ref Range   Magnesium 2.1 1.7 - 2.4 mg/dL  Phosphorus     Status: None   Collection Time: 12/05/14  5:30 AM  Result Value Ref Range   Phosphorus 2.5 2.5 - 4.6 mg/dL  Type and screen     Status: None   Collection Time: 12/05/14  6:10 AM  Result  Value Ref Range   ABO/RH(D) O POS    Antibody Screen NEG    Sample Expiration 12/08/2014   Prepare Pheresed Platelets     Status: None (Preliminary result)   Collection  Time: 12/05/14  6:10 AM  Result Value Ref Range   Unit Number [id number]    Blood Component Type PLTPH LI3 PAS    Unit division 00    Status of Unit ISSUED    Transfusion Status OK TO TRANSFUSE   Glucose, capillary     Status: Abnormal   Collection Time: 12/05/14 11:56 AM  Result Value Ref Range   Glucose-Capillary 159 (H) 65 - 99 mg/dL  Glucose, capillary     Status: Abnormal   Collection Time: 12/05/14  6:22 PM  Result Value Ref Range   Glucose-Capillary 172 (H) 65 - 99 mg/dL  Basic metabolic panel     Status: Abnormal   Collection Time: 12/05/14  8:16 PM  Result Value Ref Range   Sodium 158 (H) 135 - 145 mmol/L   Potassium 3.7 3.5 - 5.1 mmol/L   Chloride >130 (HH) 101 - 111 mmol/L    Comment: CRITICAL RESULT CALLED TO, READ BACK BY AND VERIFIED WITH BETH BUONO AT 2135 12/05/2014 BY TFK    CO2 19 (L) 22 - 32 mmol/L   Glucose, Bld 146 (H) 65 - 99 mg/dL   BUN 48 (H) 6 - 20 mg/dL   Creatinine, Ser 2.13 (H) 0.61 - 1.24 mg/dL   Calcium 8.9 8.9 - 10.3 mg/dL   GFR calc non Af Amer 30 (L) >60 mL/min   GFR calc Af Amer 35 (L) >60 mL/min    Comment: (NOTE) The eGFR has been calculated using the CKD EPI equation. This calculation has not been validated in all clinical situations. eGFR's persistently <60 mL/min signify possible Chronic Kidney Disease.    Anion gap SEE COMMENTS 5 - 15    Comment: UNABLE TO CALCULATE MPG   Dg Abd Portable 1v  12/05/2014   CLINICAL DATA:  OG tube placement.  EXAM: PORTABLE ABDOMEN - 1 VIEW  COMPARISON:  Single view of the abdomen earlier this same day.  FINDINGS: OG tube is not visualized with the tip just within the stomach.mm. Recommend advancement of 5 cm. The side port is in the distal esophagus.  IMPRESSION: OG tube tip is in the stomach with the side port in the esophagus.  Recommend advancement of 5 cm.   Electronically Signed   By: Inge Rise M.D.   On: 12/05/2014 16:49   Dg Abd Portable 1v  12/05/2014   CLINICAL DATA:  NG tube placement.  EXAM: PORTABLE ABDOMEN - 1 VIEW  COMPARISON:  None.  FINDINGS: No NG tube is visualized. Left double-J ureteral stent is noted. Contrast material in the distal colon noted.  IMPRESSION: As above.   Electronically Signed   By: Inge Rise M.D.   On: 12/05/2014 16:38    Assessment:  LINDA BIEHN is a 68 y.o. male with relapsed cell lymphoma currently day 9 status post cycle #2 Rice chemotherapy. With acute altered mental status to metabolic encephalopathy from Klebsiella sepsis. He has a urinary tract infection (pyelonephritis).[name] His mental status has declined likely due to his hypernatremia.  Plan: 1. Hematology/Oncology:  Nadir counts post cycle #2.  Patient on GCSF.  Platelet transfusion.  No bleeding. 2. Infectious disease:  Antibiotics continue for Klebsiella sepsis. 3. Fluids/electrolytes/nutrition: Appreciate nephrology consult.  Patient on D5W to correct hypernatremia. Potassium replacement ongoing (chronic issue). 4. Neurology:  Await recovery of altered mental status with correction of hypernatremia. 5. Disposition:  Continue inpatient supportive care to allow recovery of function.  Lequita Asal, MD  \

## 2014-12-06 NOTE — Progress Notes (Signed)
Pt still having bouts of congestion noted, attempted multiple times to suction mouth and throat to clear secretions.  Son at bedside throughout night. Sitter in room throughout shift. No other changes noted in patients status.

## 2014-12-06 NOTE — Progress Notes (Addendum)
ANTIBIOTIC CONSULT NOTE - INITIAL  Pharmacy Consult for Vancomycin/Zosyn Indication: sepsis  No Known Allergies  Patient Measurements: Height: 6' (182.9 cm) Weight: 153 lb 10.6 oz (69.7 kg) IBW/kg (Calculated) : 77.6 Adjusted Body Weight: 69.7 kg  Vital Signs: Temp: 98.5 F (36.9 C) (06/24 1900) Temp Source: Axillary (06/24 1900) BP: 99/54 mmHg (06/24 1930) Pulse Rate: 152 (06/24 1830) Intake/Output from previous day: 06/23 0701 - 06/24 0700 In: 2372.6 [I.V.:1592.6; Blood:210; NG/GT:520; IV Piggyback:50] Out: 2200 [Urine:2200] Intake/Output from this shift:    Labs:  Recent Labs  12/04/14 0839 12/05/14 0530 12/05/14 2016 12/06/14 0437  WBC 2.7* 1.7*  --  1.0*  HGB 7.2* 7.5*  --  7.2*  PLT 24* 13*  --  9*  CREATININE  --  2.37* 2.13* 2.04*   Estimated Creatinine Clearance: 34.6 mL/min (by C-G formula based on Cr of 2.04). No results for input(s): VANCOTROUGH, VANCOPEAK, VANCORANDOM, GENTTROUGH, GENTPEAK, GENTRANDOM, TOBRATROUGH, TOBRAPEAK, TOBRARND, AMIKACINPEAK, AMIKACINTROU, AMIKACIN in the last 72 hours.   Microbiology: Recent Results (from the past 720 hour(s))  Blood Culture (routine x 2)     Status: None   Collection Time: 11/08/14 10:21 PM  Result Value Ref Range Status   Specimen Description BLOOD  Final   Special Requests NONE  Final   Culture NO GROWTH 5 DAYS  Final   Report Status 11/13/2014 FINAL  Final  Blood Culture (routine x 2)     Status: None   Collection Time: 11/08/14 10:22 PM  Result Value Ref Range Status   Specimen Description BLOOD  Final   Special Requests NONE  Final   Culture NO GROWTH 5 DAYS  Final   Report Status 11/13/2014 FINAL  Final  Urine culture     Status: None   Collection Time: 11/09/14  2:30 AM  Result Value Ref Range Status   Specimen Description URINE, RANDOM  Final   Special Requests NONE  Final   Culture NO GROWTH 1 DAY  Final   Report Status 11/10/2014 FINAL  Final  Culture, blood (single)     Status: None    Collection Time: 11/29/14 11:09 AM  Result Value Ref Range Status   Specimen Description BLOOD  Final   Special Requests NONE  Final   Culture  Setup Time   Final    GRAM NEGATIVE RODS AEROBIC BOTTLE ONLY CRITICAL RESULT CALLED TO, READ BACK BY AND VERIFIED WITH: SANDRA VORBA ON 11/30/14 AT 0825 BY JEF    Culture KLEBSIELLA OXYTOCA AEROBIC BOTTLE ONLY   Final   Report Status 12/04/2014 FINAL  Final   Organism ID, Bacteria KLEBSIELLA OXYTOCA  Final      Susceptibility   Klebsiella oxytoca - MIC*    AMPICILLIN >=32 RESISTANT Resistant     CEFTAZIDIME <=1 SENSITIVE Sensitive     CEFAZOLIN >=64 RESISTANT Resistant     CEFTRIAXONE <=1 SENSITIVE Sensitive     CIPROFLOXACIN <=0.25 SENSITIVE Sensitive     GENTAMICIN <=1 SENSITIVE Sensitive     IMIPENEM <=0.25 SENSITIVE Sensitive     TRIMETH/SULFA <=20 SENSITIVE Sensitive     CEFOXITIN <=4 SENSITIVE Sensitive     * KLEBSIELLA OXYTOCA  Urine culture     Status: None   Collection Time: 11/29/14 11:22 AM  Result Value Ref Range Status   Specimen Description URINE, CLEAN CATCH  Final   Special Requests NONE  Final   Culture >=100,000 COLONIES/mL KLEBSIELLA OXYTOCA  Final   Report Status 12/01/2014 FINAL  Final   Organism ID,  Bacteria KLEBSIELLA OXYTOCA  Final      Susceptibility   Klebsiella oxytoca - MIC*    AMPICILLIN >=32 RESISTANT Resistant     CEFAZOLIN >=64 RESISTANT Resistant     CEFTRIAXONE 4 SENSITIVE Sensitive     CIPROFLOXACIN <=0.25 SENSITIVE Sensitive     GENTAMICIN <=1 SENSITIVE Sensitive     IMIPENEM <=0.25 SENSITIVE Sensitive     NITROFURANTOIN <=16 SENSITIVE Sensitive     TRIMETH/SULFA <=20 SENSITIVE Sensitive     CEFOXITIN <=4 SENSITIVE Sensitive     * >=100,000 COLONIES/mL KLEBSIELLA OXYTOCA  MRSA PCR Screening     Status: None   Collection Time: 11/29/14 12:13 PM  Result Value Ref Range Status   MRSA by PCR NEGATIVE NEGATIVE Final    Comment:        The GeneXpert MRSA Assay (FDA approved for NASAL  specimens only), is one component of a comprehensive MRSA colonization surveillance program. It is not intended to diagnose MRSA infection nor to guide or monitor treatment for MRSA infections.   Culture, blood (routine x 2)     Status: None   Collection Time: 11/29/14  1:19 PM  Result Value Ref Range Status   Specimen Description BLOOD  Final   Special Requests Immunocompromised  Final   Culture NO GROWTH 5 DAYS  Final   Report Status 12/04/2014 FINAL  Final  Urine culture     Status: None   Collection Time: 11/30/14 10:12 AM  Result Value Ref Range Status   Specimen Description URINE, CATHETERIZED  Final   Special Requests Immunocompromised  Final   Culture NO GROWTH 2 DAYS  Final   Report Status 12/02/2014 FINAL  Final    Medical History: Past Medical History  Diagnosis Date  . Arthritis   . Hypertension   . RA (rheumatoid arthritis)   . Anemia   . Cancer     lymphoma  . History of nuclear stress test     a. 12/2013: low risk, no sig ischemia, no EKG changes, no artifact, EF 63%  . Chronic kidney disease     Medications:  Prescriptions prior to admission  Medication Sig Dispense Refill Last Dose  . amLODipine (NORVASC) 5 MG tablet Take 5 mg by mouth daily.   unknown at unknown  . docusate sodium (COLACE) 100 MG capsule Take 1 capsule (100 mg total) by mouth 2 (two) times daily. 10 capsule 0 unknown at unknown  . folic acid (FOLVITE) 1 MG tablet Take 1 mg by mouth daily.   unknown at unknown  . hydroxychloroquine (PLAQUENIL) 200 MG tablet Take 400 mg by mouth daily.   unknown at unknown  . magnesium oxide (MAG-OX) 400 (241.3 MG) MG tablet Take 1 tablet (400 mg total) by mouth 2 (two) times daily. 30 tablet 1 unknown at unknown  . meloxicam (MOBIC) 7.5 MG tablet Take 7.5 mg by mouth daily.   unknown at unknown  . metoprolol tartrate (LOPRESSOR) 25 MG tablet Take 1 tablet (25 mg total) by mouth 2 (two) times daily. 60 tablet 0 unknown at unknown  . oxybutynin  (DITROPAN) 5 MG tablet Take 1 tablet (5 mg total) by mouth every 8 (eight) hours as needed for bladder spasms (spasms or stent related pain). (Patient taking differently: Take 5 mg by mouth every 8 (eight) hours as needed for bladder spasms (or stent related pain). ) 30 tablet 0 PRN at PRN  . oxyCODONE-acetaminophen (PERCOCET/ROXICET) 5-325 MG per tablet Take 1-2 tablets by mouth every 4 (four)  hours as needed for moderate pain. (Patient taking differently: Take 1-2 tablets by mouth every 6 (six) hours as needed for moderate pain. ) 30 tablet 0 unknown at unknown  . potassium chloride (K-DUR) 10 MEQ tablet Take 4 tablets (40 mEq total) by mouth 2 (two) times daily. 20 tablet 0 unknown at unknown  . predniSONE (DELTASONE) 5 MG tablet Take 5 mg by mouth daily.   unknown at unknown  . senna-docusate (SENOKOT-S) 8.6-50 MG per tablet Take 2 tablets by mouth 2 (two) times daily. (Patient taking differently: Take 2 tablets by mouth 2 (two) times daily as needed for mild constipation. )   PRN at PRN  . spironolactone (ALDACTONE) 25 MG tablet Take 25 mg by mouth daily.   unknown at unknown  . acetaminophen (TYLENOL) 325 MG tablet Take 2 tablets (650 mg total) by mouth every 6 (six) hours as needed for mild pain or fever. (Patient not taking: Reported on 11/29/2014) 30 tablet 0 Taking  . allopurinol (ZYLOPRIM) 300 MG tablet Take 1 tablet (300 mg total) by mouth daily. (Patient not taking: Reported on 11/29/2014) 30 tablet 2 Taking  . alum & mag hydroxide-simeth (MAALOX/MYLANTA) 200-200-20 MG/5ML suspension Take 30 mLs by mouth every 6 (six) hours as needed for indigestion or heartburn. (Patient not taking: Reported on 11/29/2014) 355 mL 0 Taking  . antiseptic oral rinse (CPC / CETYLPYRIDINIUM CHLORIDE 0.05%) 0.05 % LIQD solution 7 mLs by Mouth Rinse route 2 times daily at 12 noon and 4 pm. (Patient not taking: Reported on 11/29/2014) 50 mL 0 Taking  . feeding supplement, ENSURE ENLIVE, (ENSURE ENLIVE) LIQD Take 237 mLs  by mouth 2 (two) times daily between meals. (Patient not taking: Reported on 11/29/2014) 237 mL 12 Taking   Assessment: Pseudomonas risk factors : prior use of prednisone and hydroxychloroquine  CrCl = 34.6 ml/min Ke = 0.03 hr-1 T1/2 = 23.1 hrs Vd = 48.8 L    Goal of Therapy:  Vancomycin trough level 15-20 mcg/ml  Plan:  Expected duration 7 days with resolution of temperature and/or normalization of WBC   Zosyn 4.5 gm IV Q8H EI ordered.   Vancomycin 1 gm IV X 1 given on 6/24 @ 20:00. Vancomycin 1 gm IV Q24H ordered to start 6/25 @ 7:00, ~ 11 hrs after 1st dose (stacked dosing). This pt will reach [facility] by 6/29 @ 20:00. Will draw 1st trough on 6/28 @ 6:30, which will not be at [facility].   Taysom Glymph D 12/06/2014,8:00 PM

## 2014-12-06 NOTE — Progress Notes (Signed)
Nutrition Follow-up    INTERVENTION:   (EN): recommend continuing to advanced TF as tolerated to goal rate as per order set Nutrition related medication management: no BM since admission, recommend starting constipation protocol, noted pt is on lactulose at present  NUTRITION DIAGNOSIS:  Inadequate oral intake related to inability to eat as evidenced by NPO status. Being addressed as TF via NG   GOAL:  Patient will meet greater than or equal to 90% of their needs (EN)  EN: titration to goal rate within 12-24 hours, tolerance MONITOR:   (EN, EnergyIntake, Anthropometrics, Digestive Sysetm, Electrolyte/Renal Profile, Glucose Profile)  ASSESSMENT:  Diet Order: NPO  EN: tolerating Jevity 1.2 TF at rate of 35 ml/hr  Digestive System: NG placed yesterday, no BM documented since admission, noted a residual check of 15 mL, no signs of TF intolerance  Meds: lactulose, miralax prn, D5 with KCl at 175 ml/hr (714 kcals)   Recent Labs Lab 12/04/14 0459  12/05/14 0530 12/05/14 2016 12/06/14 0437  NA 163*  --  164* 158* 154*  K 2.8*  < > 3.2* 3.7 4.2  CL >130*  --  >130* >130* 130*  CO2 19*  --  18* 19* 18*  BUN 66*  --  57* 48* 42*  CREATININE 2.45*  --  2.37* 2.13* 2.04*  CALCIUM 9.0  --  9.1 8.9 8.6*  MG 2.4  --  2.1  --  1.9  PHOS 3.4  --  2.5  --  2.4*  GLUCOSE 182*  --  150* 146* 208*  < > = values in this interval not displayed.    Height:  Ht Readings from Last 1 Encounters:  11/29/14 6' (1.829 m)    Weight:  Wt Readings from Last 1 Encounters:  12/05/14 152 lb 5.4 oz (69.1 kg)    Filed Weights   11/29/14 1222 12/05/14 1400  Weight: 162 lb (73.483 kg) 152 lb 5.4 oz (69.1 kg)     BMI:  Body mass index is 20.66 kg/(m^2).  Estimated Nutritional Needs:  Kcal:  1994-2357kcals, BEE: 1511kcals, TEE: IF 1.1-1.3)(AF 1.2)  Protein:  74-88g protein (1.0-1.2g/kg)  Fluid:  1838-2281mL of fluid (25-30mL/kg)  EDUCATION NEEDS:  No education needs  identified at this time   Intake/Output Summary (Last 24 hours) at 12/06/14 1116 Last data filed at 12/06/14 1000  Gross per 24 hour  Intake   2135 ml  Output   2700 ml  Net   -565 ml    HIGH Care Level  Kerman Passey MS, RD, LDN [phone] Pager

## 2014-12-07 ENCOUNTER — Inpatient Hospital Stay: Payer: Medicare Other

## 2014-12-07 LAB — CULTURE, BLOOD (SINGLE)

## 2014-12-07 LAB — PREPARE PLATELET PHERESIS
Unit division: 0
Unit division: 0

## 2014-12-07 LAB — BASIC METABOLIC PANEL
ANION GAP: 10 (ref 5–15)
BUN: 46 mg/dL — ABNORMAL HIGH (ref 6–20)
CO2: 21 mmol/L — AB (ref 22–32)
Calcium: 8.9 mg/dL (ref 8.9–10.3)
Chloride: 125 mmol/L — ABNORMAL HIGH (ref 101–111)
Creatinine, Ser: 2.57 mg/dL — ABNORMAL HIGH (ref 0.61–1.24)
GFR calc non Af Amer: 24 mL/min — ABNORMAL LOW (ref >60)
GFR, EST AFRICAN AMERICAN: 28 mL/min — AB (ref >60)
Glucose, Bld: 253 mg/dL — ABNORMAL HIGH (ref 65–99)
POTASSIUM: 4.4 mmol/L (ref 3.5–5.1)
Sodium: 156 mmol/L — ABNORMAL HIGH (ref 135–145)

## 2014-12-07 LAB — GLUCOSE, CAPILLARY
GLUCOSE-CAPILLARY: 226 mg/dL — AB (ref 65–99)
GLUCOSE-CAPILLARY: 259 mg/dL — AB (ref 65–99)
Glucose-Capillary: 133 mg/dL — ABNORMAL HIGH (ref 65–99)
Glucose-Capillary: 228 mg/dL — ABNORMAL HIGH (ref 65–99)
Glucose-Capillary: 125 mg/dL — ABNORMAL HIGH (ref 65–99)

## 2014-12-07 LAB — CBC
HCT: 21.4 % — ABNORMAL LOW (ref 40.0–52.0)
HCT: 25.3 % — ABNORMAL LOW (ref 40.0–52.0)
HEMOGLOBIN: 7 g/dL — AB (ref 13.0–18.0)
Hemoglobin: 8.5 g/dL — ABNORMAL LOW (ref 13.0–18.0)
MCH: 28.3 pg (ref 26.0–34.0)
MCH: 28.1 pg (ref 26.0–34.0)
MCHC: 32.8 g/dL (ref 32.0–36.0)
MCHC: 33.4 g/dL (ref 32.0–36.0)
MCV: 86.3 fL (ref 80.0–100.0)
MCV: 84.2 fL (ref 80.0–100.0)
Platelets: 11 10*3/uL — CL (ref 150–440)
Platelets: 17 10*3/uL — CL (ref 150–440)
RBC: 2.48 MIL/uL — AB (ref 4.40–5.90)
RBC: 3.01 MIL/uL — ABNORMAL LOW (ref 4.40–5.90)
RDW: 19.9 % — ABNORMAL HIGH (ref 11.5–14.5)
RDW: 17.2 % — ABNORMAL HIGH (ref 11.5–14.5)
WBC: 0.6 10*3/uL — AB (ref 3.8–10.6)
WBC: 0.7 10*3/uL — CL (ref 3.8–10.6)

## 2014-12-07 LAB — MAGNESIUM: Magnesium: 2.3 mg/dL (ref 1.7–2.4)

## 2014-12-07 LAB — PREPARE RBC (CROSSMATCH)

## 2014-12-07 LAB — PLATELET COUNT: Platelets: 19 10*3/uL — CL (ref 150–400)

## 2014-12-07 LAB — PROTIME-INR
INR: 1.28
Prothrombin Time: 16.2 seconds — ABNORMAL HIGH (ref 11.4–15.0)

## 2014-12-07 LAB — APTT: aPTT: 29 seconds (ref 24–36)

## 2014-12-07 LAB — FIBRINOGEN: Fibrinogen: 612 mg/dL — ABNORMAL HIGH (ref 210–470)

## 2014-12-07 LAB — PHOSPHORUS: Phosphorus: 4 mg/dL (ref 2.5–4.6)

## 2014-12-07 MED ORDER — ACETAMINOPHEN 325 MG PO TABS
650.0000 mg | ORAL_TABLET | Freq: Once | ORAL | Status: AC
  Administered 2014-12-07: 650 mg via ORAL

## 2014-12-07 MED ORDER — VANCOMYCIN HCL IN DEXTROSE 750-5 MG/150ML-% IV SOLN
750.0000 mg | INTRAVENOUS | Status: DC
  Administered 2014-12-08 – 2014-12-12 (×5): 750 mg via INTRAVENOUS
  Filled 2014-12-07 (×6): qty 150

## 2014-12-07 MED ORDER — ACETAMINOPHEN 325 MG PO TABS
650.0000 mg | ORAL_TABLET | Freq: Once | ORAL | Status: AC
Start: 2014-12-07 — End: 2014-12-07
  Administered 2014-12-07: 650 mg via ORAL
  Filled 2014-12-07: qty 2

## 2014-12-07 MED ORDER — HEPARIN SOD (PORK) LOCK FLUSH 100 UNIT/ML IV SOLN
250.0000 [IU] | INTRAVENOUS | Status: DC | PRN
  Filled 2014-12-07: qty 3

## 2014-12-07 MED ORDER — SODIUM CHLORIDE 0.9 % IJ SOLN
10.0000 mL | INTRAMUSCULAR | Status: DC | PRN
Start: 2014-12-07 — End: 2014-12-10

## 2014-12-07 MED ORDER — DIPHENHYDRAMINE HCL 50 MG/ML IJ SOLN
25.0000 mg | Freq: Once | INTRAMUSCULAR | Status: AC
  Administered 2014-12-07: 25 mg via INTRAVENOUS

## 2014-12-07 MED ORDER — METHYLPREDNISOLONE SODIUM SUCC 125 MG IJ SOLR
60.0000 mg | Freq: Once | INTRAMUSCULAR | Status: AC
  Administered 2014-12-07: 60 mg via INTRAVENOUS
  Filled 2014-12-07: qty 2

## 2014-12-07 MED ORDER — DIPHENHYDRAMINE HCL 50 MG/ML IJ SOLN
INTRAMUSCULAR | Status: AC
  Filled 2014-12-07: qty 1

## 2014-12-07 MED ORDER — DIPHENHYDRAMINE HCL 50 MG/ML IJ SOLN
25.0000 mg | Freq: Once | INTRAMUSCULAR | Status: DC
  Filled 2014-12-07: qty 1

## 2014-12-07 MED ORDER — SODIUM CHLORIDE 0.9 % IV SOLN
250.0000 mL | Freq: Once | INTRAVENOUS | Status: AC
  Administered 2014-12-07: 250 mL via INTRAVENOUS

## 2014-12-07 MED ORDER — DEXTROSE 5 % IV SOLN
2.0000 g | INTRAVENOUS | Status: DC
  Administered 2014-12-07 – 2014-12-10 (×4): 2 g via INTRAVENOUS
  Filled 2014-12-07 (×7): qty 2

## 2014-12-07 MED ORDER — SODIUM CHLORIDE 0.9 % IJ SOLN: 10.0000 mL | INTRAMUSCULAR | Status: DC | PRN

## 2014-12-07 MED ORDER — SODIUM CHLORIDE 0.9 % IJ SOLN: 3.0000 mL | INTRAMUSCULAR | Status: DC | PRN

## 2014-12-07 MED ORDER — DIPHENHYDRAMINE HCL 50 MG/ML IJ SOLN
25.0000 mg | Freq: Once | INTRAMUSCULAR | Status: AC
  Administered 2014-12-07: 25 mg via INTRAVENOUS
  Filled 2014-12-07: qty 1

## 2014-12-07 MED ORDER — HEPARIN SOD (PORK) LOCK FLUSH 100 UNIT/ML IV SOLN
500.0000 [IU] | Freq: Every day | INTRAVENOUS | Status: DC | PRN
  Filled 2014-12-07: qty 5

## 2014-12-07 MED ORDER — SODIUM CHLORIDE 0.9 % IV SOLN
250.0000 mL | Freq: Once | INTRAVENOUS | Status: AC
Start: 2014-12-07 — End: 2014-12-07
  Administered 2014-12-07: 250 mL via INTRAVENOUS

## 2014-12-07 NOTE — Progress Notes (Signed)
Nutrition Follow-up      INTERVENTION:   (EN): Planning to hold tube feeding today per Dr Mortimer Fries  NUTRITION DIAGNOSIS:  Inadequate oral intake related to inability to eat as evidenced by NPO status.    GOAL:  Patient will meet greater than or equal to 90% of their needs (EN)    MONITOR:   (EN, EnergyIntake, Anthropometrics, Digestive Sysetm, Electrolyte/Renal Profile, Glucose Profile)  REASON FOR ASSESSMENT:  Consult New TPN/TNA  ASSESSMENT:  Pt with possible platelet transfusion reaction. Noted abdominal xray checked this am  Current Nutrition: NPO, tube feeding has been stopped since early am   Gastrointestinal Profile: Noted projectile vomiting of 564ml last shift (maroon in color, ?? Low platelets).  Spoke with Jeannene Patella, RN and NG tube repositioned this am to 68 cm mark confirmed by xray yesterday. Noted residual check 75ml or less.   Pam, RN reports abdomen less distended after NG tube repositioned this am, increased gas Enteral Nutrition: Tube feeding currently off since vomiting episode was running at 53ml/hr of Jevity 1.5 Last BM: 6/7   Medications: lactulose, senokot, colace, D5 KCL at 137ml/hr  Electrolyte/Renal Profile and Glucose Profile:   Recent Labs Lab 12/05/14 0530 12/05/14 2016 12/06/14 0437 12/07/14 0555  NA 164* 158* 154* 156*  K 3.2* 3.7 4.2 4.4  CL >130* >130* 130* 125*  CO2 18* 19* 18* 21*  BUN 57* 48* 42* 46*  CREATININE 2.37* 2.13* 2.04* 2.57*  CALCIUM 9.1 8.9 8.6* 8.9  MG 2.1  --  1.9 2.3  PHOS 2.5  --  2.4* 4.0  GLUCOSE 150* 146* 208* 253*   Protein Profile:  Recent Labs Lab 12/03/14 0633 12/03/14 2359 12/06/14 0437  ALBUMIN 2.1* 2.0* 2.2*    Weight Trend since Admission: Filed Weights   11/29/14 1222 12/05/14 1400 12/06/14 1404  Weight: 162 lb (73.483 kg) 152 lb 5.4 oz (69.1 kg) 153 lb 10.6 oz (69.7 kg)      Height:  Ht Readings from Last 1 Encounters:  11/29/14 6' (1.829 m)    Weight:  Wt Readings from Last  1 Encounters:  12/06/14 153 lb 10.6 oz (69.7 kg)        BMI:  Body mass index is 20.84 kg/(m^2).  Estimated Nutritional Needs:  Kcal:  1994-2357kcals, BEE: 1511kcals, TEE: IF 1.1-1.3)(AF 1.2)  Protein:  74-88g protein (1.0-1.2g/kg)  Fluid:  1838-2242mL of fluid (25-62mL/kg)    Diet Order:  Diet NPO time specified  EDUCATION NEEDS:  No education needs identified at this time   Intake/Output Summary (Last 24 hours) at 12/07/14 0953 Last data filed at 12/07/14 0200  Gross per 24 hour  Intake 2324.58 ml  Output   2775 ml  Net -450.42 ml     HIGH Care Level Albirta Rhinehart B. Zenia Resides, Melvin, [place] (pager)

## 2014-12-07 NOTE — Consult Note (Signed)
[facility] Infectious Disease     Reason for Consult: fever, GNR bacteremia    Referring Physician: Roosevelt Locks Date of Admission:  11/29/2014   Principal Problem:   Sepsis Active Problems:   Mantle cell lymphoma   Altered mental status   Chest pain   Metabolic encephalopathy   HPI: Caleb Francis is a 69 y.o. male with mantle cell lymphoma undergoing chemo admitted 6/15 from oncology clinic with AMS, chest pain.  He was found to have a UTI with U CX + and bacteremia with Klebsiella oxytoca and has been on ceftriaxone until 6/24 when he developed fever and hemodynamic instability at time of blood transfusion.  He has been neutropenic as well and is getting GCSF with his chemo.    Past Medical History  Diagnosis Date  . Arthritis   . Hypertension   . RA (rheumatoid arthritis)   . Anemia   . Cancer     lymphoma  . History of nuclear stress test     a. 12/2013: low risk, no sig ischemia, no EKG changes, no artifact, EF 63%  . Chronic kidney disease    Past Surgical History  Procedure Laterality Date  . Back surgery    . Fracture surgery      ankle  . Portacath placement    . Cystoscopy w/ ureteral stent placement Left 10/24/2014    Procedure: CYSTOSCOPY WITH RETROGRADE PYELOGRAM/URETERAL STENT PLACEMENT;  Surgeon: Irine Seal, MD;  Location: [facility] ORS;  Service: Urology;  Laterality: Left;   History  Substance Use Topics  . Smoking status: Former Smoker -- 0.50 packs/day for 60 years    Types: Cigarettes    Quit date: 10/18/2014  . Smokeless tobacco: Not on file  . Alcohol Use: No   Family History  Problem Relation Age of Onset  . Cancer Mother     breast  . Cancer Father     bone cancer    Allergies: No Known Allergies  Current antibiotics: Antibiotics Given (last 72 hours)    Date/Time Action Medication Dose Rate   12/04/14 1055 Given   cefTRIAXone (ROCEPHIN) 2 g in dextrose 5 % 50 mL IVPB - Premix 2 g 100 mL/hr   12/05/14 1006 Given   cefTRIAXone  (ROCEPHIN) 2 g in dextrose 5 % 50 mL IVPB - Premix 2 g 100 mL/hr   12/06/14 0944 Given   cefTRIAXone (ROCEPHIN) 2 g in dextrose 5 % 50 mL IVPB - Premix 2 g 100 mL/hr   12/06/14 2013 Given   vancomycin (VANCOCIN) IVPB 1000 mg/200 mL premix 1,000 mg 200 mL/hr   12/06/14 2100 Given   cefTRIAXone (ROCEPHIN) 1 g in dextrose 5 % 50 mL IVPB - Premix 1 g 100 mL/hr   12/06/14 2211 Given   piperacillin-tazobactam (ZOSYN) IVPB 4.5 g 4.5 g 200 mL/hr   12/07/14 0554 Given   piperacillin-tazobactam (ZOSYN) IVPB 4.5 g 4.5 g 200 mL/hr   12/07/14 6063 Given   vancomycin (VANCOCIN) IVPB 1000 mg/200 mL premix 1,000 mg 200 mL/hr      MEDICATIONS: . acetaminophen  650 mg Oral Once  . antiseptic oral rinse  7 mL Mouth Rinse BID  . aspirin  324 mg Oral Once  . cefTRIAXone (ROCEPHIN)  IV  1 g Intravenous Q24H  . docusate  50 mg Oral BID  . filgrastim  480 mcg Subcutaneous Once  . filgrastim  480 mcg Subcutaneous Daily  . insulin aspart  1-3 Units Subcutaneous Q6H  . lactulose  30  g Oral BID  . methylPREDNISolone (SOLU-MEDROL) injection  60 mg Intravenous Once  . piperacillin-tazobactam (ZOSYN)  IV  4.5 g Intravenous 3 times per day  . scopolamine  1 patch Transdermal Q72H  . sennosides  5 mL Oral BID  . sodium chloride  3 mL Intravenous Q12H  . thiamine IV  100 mg Intravenous Daily  . vancomycin  1,000 mg Intravenous Q24H  . ziprasidone  10 mg Intramuscular QHS    Review of Systems - 11 systems reviewed and negative per HPI   OBJECTIVE: Temp:  [97.5 F (36.4 C)-99.2 F (37.3 C)] 97.5 F (36.4 C) (06/25 0715) Pulse Rate:  [101-152] 117 (06/25 0800) Resp:  [20-51] 21 (06/25 0800) BP: (90-181)/(54-135) 117/71 mmHg (06/25 0800) SpO2:  [99 %-100 %] 100 % (06/25 0800) Weight:  [69.7 kg (153 lb 10.6 oz)] 69.7 kg (153 lb 10.6 oz) (06/24 1404) Physical Exam  Constitutional: Lethargic, chronically ill appearing. NGT in place HENT: Muddy sclera, PERRLA  Mouth/Throat: Oropharynx very dry, no thrush.    Neck - supple Cardiovascular: Tachy, distant Pulmonary/Chest: rhonchi bil Abdominal: Soft. Mild  distension. + bs GU - foley cath in place Lymphadenopathy:  Mild shoddy cervical LAN.  Neurological: He is lethartic Skin: Skin is cool to touch and dry. No rash noted. No erythema.  Psychiatric: lethargic Access- R PC site wnl,     LABS: Results for orders placed or performed during the hospital encounter of 11/29/14 (from the past 48 hour(s))  Glucose, capillary     Status: Abnormal   Collection Time: 12/05/14 11:56 AM  Result Value Ref Range   Glucose-Capillary 159 (H) 65 - 99 mg/dL  Glucose, capillary     Status: Abnormal   Collection Time: 12/05/14  6:22 PM  Result Value Ref Range   Glucose-Capillary 172 (H) 65 - 99 mg/dL  Basic metabolic panel     Status: Abnormal   Collection Time: 12/05/14  8:16 PM  Result Value Ref Range   Sodium 158 (H) 135 - 145 mmol/L   Potassium 3.7 3.5 - 5.1 mmol/L   Chloride >130 (HH) 101 - 111 mmol/L    Comment: CRITICAL RESULT CALLED TO, READ BACK BY AND VERIFIED WITH BETH BUONO AT 2135 12/05/2014 BY TFK    CO2 19 (L) 22 - 32 mmol/L   Glucose, Bld 146 (H) 65 - 99 mg/dL   BUN 48 (H) 6 - 20 mg/dL   Creatinine, Ser 2.13 (H) 0.61 - 1.24 mg/dL   Calcium 8.9 8.9 - 10.3 mg/dL   GFR calc non Af Amer 30 (L) >60 mL/min   GFR calc Af Amer 35 (L) >60 mL/min    Comment: (NOTE) The eGFR has been calculated using the CKD EPI equation. This calculation has not been validated in all clinical situations. eGFR's persistently <60 mL/min signify possible Chronic Kidney Disease.    Anion gap SEE COMMENTS 5 - 15    Comment: UNABLE TO CALCULATE MPG  Glucose, capillary     Status: Abnormal   Collection Time: 12/06/14 12:05 AM  Result Value Ref Range   Glucose-Capillary 133 (H) 65 - 99 mg/dL  Comprehensive metabolic panel     Status: Abnormal   Collection Time: 12/06/14  4:37 AM  Result Value Ref Range   Sodium 154 (H) 135 - 145 mmol/L   Potassium 4.2 3.5  - 5.1 mmol/L   Chloride 130 (H) 101 - 111 mmol/L   CO2 18 (L) 22 - 32 mmol/L   Glucose, Bld 208 (H)  65 - 99 mg/dL   BUN 42 (H) 6 - 20 mg/dL   Creatinine, Ser 2.04 (H) 0.61 - 1.24 mg/dL   Calcium 8.6 (L) 8.9 - 10.3 mg/dL   Total Protein 5.9 (L) 6.5 - 8.1 g/dL   Albumin 2.2 (L) 3.5 - 5.0 g/dL   AST 28 15 - 41 U/L   ALT 20 17 - 63 U/L   Alkaline Phosphatase 141 (H) 38 - 126 U/L   Total Bilirubin 2.2 (H) 0.3 - 1.2 mg/dL   GFR calc non Af Amer 32 (L) >60 mL/min   GFR calc Af Amer 37 (L) >60 mL/min    Comment: (NOTE) The eGFR has been calculated using the CKD EPI equation. This calculation has not been validated in all clinical situations. eGFR's persistently <60 mL/min signify possible Chronic Kidney Disease.    Anion gap 6 5 - 15  Magnesium     Status: None   Collection Time: 12/06/14  4:37 AM  Result Value Ref Range   Magnesium 1.9 1.7 - 2.4 mg/dL  Phosphorus     Status: Abnormal   Collection Time: 12/06/14  4:37 AM  Result Value Ref Range   Phosphorus 2.4 (L) 2.5 - 4.6 mg/dL  Triglycerides     Status: Abnormal   Collection Time: 12/06/14  4:37 AM  Result Value Ref Range   Triglycerides 262 (H) <150 mg/dL  CBC     Status: Abnormal   Collection Time: 12/06/14  4:37 AM  Result Value Ref Range   WBC 1.0 (LL) 3.8 - 10.6 K/uL    Comment: CRITICAL RESULT CALLED TO, READ BACK BY AND VERIFIED WITH: ART STROTHMAN _0  12/06/14 BY AJO    RBC 2.50 (L) 4.40 - 5.90 MIL/uL   Hemoglobin 7.2 (L) 13.0 - 18.0 g/dL   HCT 21.6 (L) 40.0 - 52.0 %   MCV 86.6 80.0 - 100.0 fL   MCH 28.9 26.0 - 34.0 pg   MCHC 33.4 32.0 - 36.0 g/dL   RDW 20.3 (H) 11.5 - 14.5 %   Platelets 9 (LL) 150 - 400 K/uL    Comment: CRITICAL RESULT CALLED TO, READ BACK BY AND VERIFIED WITH: ART STROTHMAN _1  12/06/14 BY AJO PLATELET COUNT CONFIRMED BY SMEAR   Prealbumin     Status: Abnormal   Collection Time: 12/06/14  4:38 AM  Result Value Ref Range   Prealbumin 10.5 (L) 18 - 38 mg/dL    Comment: Performed at [facility]  Glucose, capillary     Status: Abnormal   Collection Time: 12/06/14  5:50 AM  Result Value Ref Range   Glucose-Capillary 155 (H) 65 - 99 mg/dL  Glucose, capillary     Status: Abnormal   Collection Time: 12/06/14  7:49 AM  Result Value Ref Range   Glucose-Capillary 158 (H) 65 - 99 mg/dL  Glucose, capillary     Status: Abnormal   Collection Time: 12/06/14 11:02 AM  Result Value Ref Range   Glucose-Capillary 173 (H) 65 - 99 mg/dL  Prepare Pheresed Platelets     Status: None (Preliminary result)   Collection Time: 12/06/14  1:00 PM  Result Value Ref Range   Unit Number [id number]    Blood Component Type PLTPHER LI2    Unit division 00    Status of Unit ALLOCATED    Transfusion Status OK TO TRANSFUSE   Prepare Pheresed Platelets     Status: None (Preliminary result)   Collection Time: 12/06/14  2:00 PM  Result Value Ref  Range   Unit Number [id number]    Blood Component Type PLTPHER LRI1    Unit division 00    Status of Unit ISSUED    Transfusion Status OK TO TRANSFUSE    Unit Number [id number]    Blood Component Type PLTP LI1 PAS    Unit division 00    Status of Unit ISSUED    Transfusion Status OK TO TRANSFUSE   Glucose, capillary     Status: Abnormal   Collection Time: 12/06/14  3:01 PM  Result Value Ref Range   Glucose-Capillary 165 (H) 65 - 99 mg/dL  Glucose, capillary     Status: Abnormal   Collection Time: 12/06/14  6:18 PM  Result Value Ref Range   Glucose-Capillary 149 (H) 65 - 99 mg/dL  Culture, blood (routine x 2)     Status: None (Preliminary result)   Collection Time: 12/06/14  8:08 PM  Result Value Ref Range   Specimen Description BLOOD    Special Requests NONE    Culture NO GROWTH < 12 HOURS    Report Status PENDING   Culture, blood (routine x 2)     Status: None (Preliminary result)   Collection Time: 12/06/14  8:40 PM  Result Value Ref Range   Specimen Description BLOOD    Special Requests NONE    Culture NO GROWTH < 12 HOURS     Report Status PENDING   Glucose, capillary     Status: Abnormal   Collection Time: 12/06/14 11:39 PM  Result Value Ref Range   Glucose-Capillary 207 (H) 65 - 99 mg/dL  CBC     Status: Abnormal   Collection Time: 12/07/14  5:55 AM  Result Value Ref Range   WBC 0.6 (LL) 3.8 - 10.6 K/uL    Comment: RESULT REPEATED AND VERIFIED CRITICAL VALUE NOTED.  VALUE IS CONSISTENT WITH PREVIOUSLY REPORTED AND CALLED VALUE. PREVIOUSLY CALLED AT 0530 12/06/14.PMH    RBC 2.48 (L) 4.40 - 5.90 MIL/uL   Hemoglobin 7.0 (L) 13.0 - 18.0 g/dL   HCT 21.4 (L) 40.0 - 52.0 %   MCV 86.3 80.0 - 100.0 fL   MCH 28.3 26.0 - 34.0 pg   MCHC 32.8 32.0 - 36.0 g/dL   RDW 19.9 (H) 11.5 - 14.5 %   Platelets 11 (LL) 150 - 440 K/uL    Comment: RESULT REPEATED AND VERIFIED CRITICAL VALUE NOTED.  VALUE IS CONSISTENT WITH PREVIOUSLY REPORTED AND CALLED VALUE. PREVIOUSLY CALLED AT 0530 12/06/14.PMH PLATELET COUNT CONFIRMED BY SMEAR   Basic metabolic panel     Status: Abnormal   Collection Time: 12/07/14  5:55 AM  Result Value Ref Range   Sodium 156 (H) 135 - 145 mmol/L   Potassium 4.4 3.5 - 5.1 mmol/L   Chloride 125 (H) 101 - 111 mmol/L   CO2 21 (L) 22 - 32 mmol/L   Glucose, Bld 253 (H) 65 - 99 mg/dL   BUN 46 (H) 6 - 20 mg/dL   Creatinine, Ser 2.57 (H) 0.61 - 1.24 mg/dL   Calcium 8.9 8.9 - 10.3 mg/dL   GFR calc non Af Amer 24 (L) >60 mL/min   GFR calc Af Amer 28 (L) >60 mL/min    Comment: (NOTE) The eGFR has been calculated using the CKD EPI equation. This calculation has not been validated in all clinical situations. eGFR's persistently <60 mL/min signify possible Chronic Kidney Disease.    Anion gap 10 5 - 15  Magnesium     Status: None  Collection Time: 12/07/14  5:55 AM  Result Value Ref Range   Magnesium 2.3 1.7 - 2.4 mg/dL  Phosphorus     Status: None   Collection Time: 12/07/14  5:55 AM  Result Value Ref Range   Phosphorus 4.0 2.5 - 4.6 mg/dL  Glucose, capillary     Status: Abnormal   Collection  Time: 12/07/14  8:18 AM  Result Value Ref Range   Glucose-Capillary 226 (H) 65 - 99 mg/dL   No components found for: ESR, C REACTIVE PROTEIN MICRO: Recent Results (from the past 720 hour(s))  Blood Culture (routine x 2)     Status: None   Collection Time: 11/08/14 10:21 PM  Result Value Ref Range Status   Specimen Description BLOOD  Final   Special Requests NONE  Final   Culture NO GROWTH 5 DAYS  Final   Report Status 11/13/2014 FINAL  Final  Blood Culture (routine x 2)     Status: None   Collection Time: 11/08/14 10:22 PM  Result Value Ref Range Status   Specimen Description BLOOD  Final   Special Requests NONE  Final   Culture NO GROWTH 5 DAYS  Final   Report Status 11/13/2014 FINAL  Final  Urine culture     Status: None   Collection Time: 11/09/14  2:30 AM  Result Value Ref Range Status   Specimen Description URINE, RANDOM  Final   Special Requests NONE  Final   Culture NO GROWTH 1 DAY  Final   Report Status 11/10/2014 FINAL  Final  Culture, blood (single)     Status: None   Collection Time: 11/29/14 11:09 AM  Result Value Ref Range Status   Specimen Description BLOOD  Final   Special Requests NONE  Final   Culture  Setup Time   Final    GRAM NEGATIVE RODS AEROBIC BOTTLE ONLY CRITICAL RESULT CALLED TO, READ BACK BY AND VERIFIED WITH: SANDRA VORBA ON 11/30/14 AT 0825 BY JEF    Culture KLEBSIELLA OXYTOCA AEROBIC BOTTLE ONLY   Final   Report Status 12/04/2014 FINAL  Final   Organism ID, Bacteria KLEBSIELLA OXYTOCA  Final      Susceptibility   Klebsiella oxytoca - MIC*    AMPICILLIN >=32 RESISTANT Resistant     CEFTAZIDIME <=1 SENSITIVE Sensitive     CEFAZOLIN >=64 RESISTANT Resistant     CEFTRIAXONE <=1 SENSITIVE Sensitive     CIPROFLOXACIN <=0.25 SENSITIVE Sensitive     GENTAMICIN <=1 SENSITIVE Sensitive     IMIPENEM <=0.25 SENSITIVE Sensitive     TRIMETH/SULFA <=20 SENSITIVE Sensitive     CEFOXITIN <=4 SENSITIVE Sensitive     * KLEBSIELLA OXYTOCA  Urine culture      Status: None   Collection Time: 11/29/14 11:22 AM  Result Value Ref Range Status   Specimen Description URINE, CLEAN CATCH  Final   Special Requests NONE  Final   Culture >=100,000 COLONIES/mL KLEBSIELLA OXYTOCA  Final   Report Status 12/01/2014 FINAL  Final   Organism ID, Bacteria KLEBSIELLA OXYTOCA  Final      Susceptibility   Klebsiella oxytoca - MIC*    AMPICILLIN >=32 RESISTANT Resistant     CEFAZOLIN >=64 RESISTANT Resistant     CEFTRIAXONE 4 SENSITIVE Sensitive     CIPROFLOXACIN <=0.25 SENSITIVE Sensitive     GENTAMICIN <=1 SENSITIVE Sensitive     IMIPENEM <=0.25 SENSITIVE Sensitive     NITROFURANTOIN <=16 SENSITIVE Sensitive     TRIMETH/SULFA <=20 SENSITIVE Sensitive  CEFOXITIN <=4 SENSITIVE Sensitive     * >=100,000 COLONIES/mL KLEBSIELLA OXYTOCA  MRSA PCR Screening     Status: None   Collection Time: 11/29/14 12:13 PM  Result Value Ref Range Status   MRSA by PCR NEGATIVE NEGATIVE Final    Comment:        The GeneXpert MRSA Assay (FDA approved for NASAL specimens only), is one component of a comprehensive MRSA colonization surveillance program. It is not intended to diagnose MRSA infection nor to guide or monitor treatment for MRSA infections.   Culture, blood (routine x 2)     Status: None   Collection Time: 11/29/14  1:19 PM  Result Value Ref Range Status   Specimen Description BLOOD  Final   Special Requests Immunocompromised  Final   Culture NO GROWTH 5 DAYS  Final   Report Status 12/04/2014 FINAL  Final  Urine culture     Status: None   Collection Time: 11/30/14 10:12 AM  Result Value Ref Range Status   Specimen Description URINE, CATHETERIZED  Final   Special Requests Immunocompromised  Final   Culture NO GROWTH 2 DAYS  Final   Report Status 12/02/2014 FINAL  Final  Culture, blood (routine x 2)     Status: None (Preliminary result)   Collection Time: 12/06/14  8:08 PM  Result Value Ref Range Status   Specimen Description BLOOD  Final    Special Requests NONE  Final   Culture NO GROWTH < 12 HOURS  Final   Report Status PENDING  Incomplete  Culture, blood (routine x 2)     Status: None (Preliminary result)   Collection Time: 12/06/14  8:40 PM  Result Value Ref Range Status   Specimen Description BLOOD  Final   Special Requests NONE  Final   Culture NO GROWTH < 12 HOURS  Final   Report Status PENDING  Incomplete    IMAGING: Dg Chest 1 View  12/06/2014   CLINICAL DATA:  Pulmonary congestion.  EXAM: CHEST  1 VIEW  COMPARISON:  12/06/2014  FINDINGS: Nasogastric tube is in place with tip off the film but beyond the gastroesophageal junction. Gaseous distension of a viscus loop partially imaged.  Right-sided Port-A-Cath tip overlies the level of superior vena cava. The heart is enlarged. Suspect small right pleural effusion. Right basilar opacity is stable.  IMPRESSION: 1. Cardiomegaly. 2. Right basilar opacity and probable effusion. 3. Dilated viscus only partially imaged, likely stomach or colon.   Electronically Signed   By: Nolon Nations M.D.   On: 12/06/2014 19:32   Dg Chest 1 View  12/06/2014   CLINICAL DATA:  NG tube placement  EXAM: CHEST  1 VIEW  COMPARISON:  11/29/2014  FINDINGS: Cardiomegaly again noted. Right IJ Port-A-Cath is unchanged in position. NG tube in place with tip in proximal stomach. There is hazy right basilar atelectasis or infiltrate. No pulmonary edema.  IMPRESSION: NG tube with tip in proximal stomach. Hazy right basilar atelectasis or infiltrate. No pulmonary edema.   Electronically Signed   By: Lahoma Crocker M.D.   On: 12/06/2014 14:40   Dg Abd 1 View  12/07/2014   CLINICAL DATA:  Abdominal distention  EXAM: ABDOMEN - 1 VIEW  COMPARISON:  12/06/2014; 12/05/2014  FINDINGS: Interval removal of enteric tube.  Worsening severe gas distention of the stomach. Worsening moderate gas distention of the transverse colon with index loop measuring approximately 6.5 cm in diameter.  No supine evidence of  pneumoperitoneum. No definite pneumatosis or portal venous  gas. Enteric contrast remains within the rectum.  Similar appearance of left-sided double-J ureteral stent.  Limited visualization lower thorax demonstrates enlarged cardiac silhouette and perihilar opacities, likely atelectasis.  No definite acute osseus abnormalities.  IMPRESSION: 1. Interval removal of enteric tube with worsening severe gaseous distention of the stomach, nonspecific though could be seen in the setting of gastric outlet obstruction. 2. Worsening gaseous distention of the colon with residual contrast seen within the rectum nonspecific though could be seen in the setting of ileus.   Electronically Signed   By: Sandi Mariscal M.D.   On: 12/07/2014 08:56   Dg Abd 1 View  12/06/2014   CLINICAL DATA:  Nasogastric tube placement.  EXAM: ABDOMEN - 1 VIEW  COMPARISON:  12/05/2014  FINDINGS: The gastric bubble is distended. The nasogastric tube tip is obscured by motion artifact but thought to be in the vicinity of the gastric cardia. Left renal stent noted. There continues to be some barium in the rectum.  IMPRESSION: 1. Nasogastric tube is indistinct due to motion artifact, but thought to terminate in the gastric cardia. Distended stomach bubble. 2. Left ureteral stent.   Electronically Signed   By: Van Clines M.D.   On: 12/06/2014 14:36   Ct Head Wo Contrast  11/29/2014   CLINICAL DATA:  Concern for chemotherapy adverse reaction. Patient transfer from cancer center. Altered mental status.  EXAM: CT HEAD WITHOUT CONTRAST  TECHNIQUE: Contiguous axial images were obtained from the base of the skull through the vertex without intravenous contrast.  COMPARISON:  Head CT 11/09/2014  FINDINGS: No acute intracranial hemorrhage. No focal mass lesion. No CT evidence of acute infarction. No midline shift or mass effect. No hydrocephalus. Basilar cisterns are patent.  There is mild generalized cortical atrophy and proportional ventricular  dilatation. Mild periventricular subcortical white matter hypodensities.  Paranasal sinuses and  mastoid air cells are clear.  IMPRESSION: 1. No acute intracranial findings.  No change from prior. 2. Mild atrophy and white matter microvascular disease.   Electronically Signed   By: Suzy Bouchard M.D.   On: 11/29/2014 13:48   Ct Head Wo Contrast  11/09/2014   CLINICAL DATA:  Degenerating mental status, sepsis and history of progressive mantle cell lymphoma.  EXAM: CT HEAD WITHOUT CONTRAST  TECHNIQUE: Contiguous axial images were obtained from the base of the skull through the vertex without intravenous contrast.  COMPARISON:  05/21/2009  FINDINGS: No findings by unenhanced head CT to suggest brain involvement by lymphoma. The brain demonstrates no evidence of hemorrhage, infarction, edema, mass effect, extra-axial fluid collection, hydrocephalus or mass lesion. The skull is unremarkable.  IMPRESSION: Unremarkable head CT.   Electronically Signed   By: Aletta Edouard M.D.   On: 11/09/2014 13:30   Mr Brain Wo Contrast  12/02/2014   CLINICAL DATA:  69 year old male with altered mental status, recent sepsis and urinary tract infection. Undergoing chemotherapy for mantle cell lymphoma. Initial encounter.  EXAM: MRI HEAD WITHOUT CONTRAST  TECHNIQUE: Multiplanar, multiecho pulse sequences of the brain and surrounding structures were obtained without intravenous contrast.  COMPARISON:  Head CTs without contrast 11/29/2014 and earlier.  FINDINGS: Study is intermittently degraded by motion artifact despite repeated imaging attempts.  Cerebral volume is within normal limits for age. No restricted diffusion to suggest acute infarction. No midline shift, mass effect, evidence of mass lesion, ventriculomegaly, extra-axial collection or acute intracranial hemorrhage. Cervicomedullary junction and pituitary are within normal limits. Major intracranial vascular flow voids are grossly preserved at the skullbase.  Minimal to  mild for age nonspecific cerebral white matter signal changes. 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 No other encephalomalacia identified.  Mild left mastoid effusion has been present since a 2014 neck CT comparison. Right mastoids are clear. Paranasal sinuses are clear. Negative orbit and scalp soft tissues.  IMPRESSION: 1. No acute infarct or acute intracranial abnormality evident on this noncontrast exam. Early metastatic/lymphoma involvement of the brain cannot be excluded in the absence of intravenous contrast. 2. 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 3. Mild chronic left mastoid effusion, most often postinflammatory and significance doubtful.   Electronically Signed   By: Genevie Ann M.D.   On: 12/02/2014 16:45   Ct Abdomen Pelvis W Contrast  11/25/2014   CLINICAL DATA:  69 year old male with history of mantle cell lymphoma recently treated with chemotherapy.  EXAM: CT ABDOMEN AND PELVIS WITH CONTRAST  TECHNIQUE: Multidetector CT imaging of the abdomen and pelvis was performed using the standard protocol following bolus administration of intravenous contrast.  CONTRAST:  71m OMNIPAQUE IOHEXOL 300 MG/ML  SOLN  COMPARISON:  CT of the abdomen and pelvis 11/01/2014.  FINDINGS: Lower chest: Retrocrural lymph nodes are again noted bilaterally, largest of which on the right measures 1.6 cm in short axis, similar to the prior examination. Small amount of pericardial fluid and/or thickening, unlikely to be of any hemodynamic significance at this time.  Hepatobiliary: Numerous tiny partially calcified gallstones are noted within the gallbladder. No findings to suggest acute cholecystitis at this time. No significant cystic or solid hepatic lesions. No intra or extrahepatic biliary ductal dilatation.  Pancreas: No pancreatic mass. No pancreatic ductal dilatation. No pancreatic or peripancreatic fluid or inflammatory changes.  Spleen: Spleen remains mildly enlarged, but is smaller  than the prior examination, currently measuring up to 13.2 cm in length.  Adrenals/Urinary Tract: Thickening of the left adrenal gland is unchanged. Right adrenal gland is normal. Right kidney is normal in appearance. There is a left-sided double-J ureteral stent in position, with the proximal loop reformed in the interpolar region of the left renal collecting system, and the distal loop performed in the lumen of the urinary bladder. Left kidney is otherwise normal in appearance. No hydroureteronephrosis. Mild diffuse bladder wall thickening, likely in part related to underdistention of the bladder. No discrete bladder mass identified.  Stomach/Bowel: The appearance of the stomach is normal. No pathologic dilatation of small bowel or colon. Normal appendix.  Vascular/Lymphatic: Extensive lymphadenopathy is again noted throughout the abdomen and pelvis. The bulkiest retroperitoneal node or nodal conglomerate is in the left para-aortic nodal station immediately inferior to the left renal hilum, and this has decreased in size compared to the prior study, currently measuring 6.4 x 5.3 cm (image 25 of series 2), as compared with 8.8 x 6.9 cm on prior exam 11/09/2014. Bulky pelvic adenopathy is again noted bilaterally, generally similar to the prior study, with index lymph node in the left external iliac nodal station (image 57 of series 2) only slightly smaller than the prior examination, currently measuring 3.6 x 5.0 cm (previously 4.2 x 5.1 cm). However, some lymph nodes are larger than the prior examination, including a right external iliac lymph node, which currently measures 4.9 x 3.5 cm (image 59 of series 2) as compared with 4.7 x 3.0 cm on the prior study. Centrally low-attenuation presumably necrotic lymph node in the left common femoral nodal station measuring 3.4 x 4.8 cm. Extensive atherosclerosis throughout the abdominal and pelvic vasculature, without  evidence of aneurysm or dissection.  Reproductive:  Coarse calcifications are noted in the prostate gland (nonspecific). Seminal vesicles are unremarkable in appearance.  Other: No significant volume of ascites.  No pneumoperitoneum.  Musculoskeletal: Status post L4 laminectomy. There are no aggressive appearing lytic or blastic lesions noted in the visualized portions of the skeleton.  IMPRESSION: 1. Today's study generally demonstrates slight positive response to therapy, with generalized decrease of lymphadenopathy in the abdomen and pelvis. However, there several lymph nodes which are essentially unchanged, and some lymph nodes which are slightly increased compared to the prior examination, as discussed above. 2. Additional findings, as above, similar to the prior examination.   Electronically Signed   By: Vinnie Langton M.D.   On: 11/25/2014 13:12   Ct Abdomen Pelvis W Contrast  11/09/2014   CLINICAL DATA:  Diffuse abdominal pain.  Chemotherapy yesterday.  EXAM: CT ABDOMEN AND PELVIS WITH CONTRAST  TECHNIQUE: Multidetector CT imaging of the abdomen and pelvis was performed using the standard protocol following bolus administration of intravenous contrast.  CONTRAST:  173m OMNIPAQUE IOHEXOL 300 MG/ML  SOLN  COMPARISON:  10/23/2014  FINDINGS: BODY WALL: Partly visible left lower axillary lymphadenopathy, recently evaluated by chest CT 10/30/2014.  There is bulky bilateral inguinal lymphadenopathy which has increased from 10/23/2014, with left inguinal node on image 88 measuring 31 mm short axis, previously 23 mm  LOWER CHEST: Reticular opacities in the lower lungs, greater on the right, is favored atelectasis. No definitive pneumonia. Lower thoracic lymphadenopathy has increased, with a left retrocrural lymph node currently 15 mm on image 23, previously 10 mm.  ABDOMEN/PELVIS:  Liver: No focal abnormality.  Biliary: Cholelithiasis. The gallbladder is distended but there is no inflammatory wall thickening.  Pancreas: Unremarkable.  Spleen: Mild splenomegaly,  15 cm in craniocaudal span. This is increased by approximately 4 cm since previous.  Adrenals: Negative.  Kidneys and ureters: Moderate bilateral hydroureteronephrosis to the level of the pelvis. This is despite an internal ureteral stent on the left which is well positioned. Symmetric renal enhancement.  Bladder: Thickening of the bladder wall, preferentially to the left, stable from previous. This is indeterminate and will be assessed on followup imaging.  Reproductive: No pathologic findings.  Bowel: No obstruction. No inflammatory bowel wall thickening  Retroperitoneum: There is bulky and diffuse retroperitoneal lymphadenopathy. There has been significant and rapid increased from previous. Index left periaortic lymph node on image 39 measures 88 x 69 mm (previously 59 x 35 mm) on image 39). Left pelvic sidewall lymph node that was previously measured is on image 71 and stable at 58 x 41 mm. There is new enlargement of upper abdominal retroperitoneal lymph nodes . Some cystic change, presumably treatment related, present in the largest nodes, especially in the left pelvis.  Vascular: Mass effect on the left iliac veins by lymphadenopathy, with progressive subcutaneous edema in the left upper thigh.  OSSEOUS: No acute abnormalities.  IMPRESSION: 1. Rapid progression of lymphoma with marked enlargement of lymph nodes compared to 10/23/2014. 2. Bilateral hydroureteronephrosis, new on the right and unchanged on the left (despite a well-positioned left internal ureteral stent). 3. Progressive edema in the upper left thigh. Given severe compression of the left iliac vein by lymphadenopathy, consider Doppler to evaluate for DVT.   Electronically Signed   By: JMonte FantasiaM.D.   On: 11/09/2014 02:13   [place]  11/30/2014   CLINICAL DATA:  Lymphoma, urinary tract infection, previous placement of a left ureteral stent for obstructive lymphadenopathy  and mass effect. Followup  EXAM: RENAL / URINARY TRACT ULTRASOUND  COMPLETE  COMPARISON:  CT 11/25/2014  FINDINGS: Right Kidney:  Length: 12.4 cm. No mass or hydronephrosis. Mild cortical atrophy with increased cortical echogenicity.  Left Kidney:  Length: 14.3 cm. Improvement in now mild hydronephrosis. Mild left renal cortical thinning and increased echogenicity. Stent position not well visualized.  Bladder:  Concentric bladder wall thickening, 0.9 cm.  IMPRESSION: Improvement in now mild left hydronephrosis compared to the dissimilar prior exam.  Bilateral increased renal cortical echogenicity suggesting medical renal disease with mild degree of cortical atrophy.  Concentric bladder wall thickening which is nonspecific and may be seen with urinary tract infection/ cystitis, infiltrative processes including lymphomatous involvement, or treatment effect.   Electronically Signed   By: Conchita Paris M.D.   On: 11/30/2014 11:24   [place] Extrem Low Left Comp  11/09/2014   CLINICAL DATA:  Left calf swelling and lower extremity pain. History of lymphoma.  EXAM: Left LOWER EXTREMITY VENOUS DOPPLER ULTRASOUND  TECHNIQUE: Gray-scale sonography with graded compression, as well as color Doppler and duplex ultrasound were performed to evaluate the lower extremity deep venous systems from the level of the common femoral vein and including the common femoral, femoral, profunda femoral, popliteal and calf veins including the posterior tibial, peroneal and gastrocnemius veins when visible. The superficial great saphenous vein was also interrogated. Spectral Doppler was utilized to evaluate flow at rest and with distal augmentation maneuvers in the common femoral, femoral and popliteal veins.  COMPARISON:  None.  FINDINGS: Contralateral Common Femoral Vein: Respiratory phasicity is normal and symmetric with the symptomatic side. No evidence of thrombus. Normal compressibility.  Common Femoral Vein: No evidence of thrombus. Normal compressibility, respiratory phasicity and response to  augmentation.  Saphenofemoral Junction - Superficial Great Saphenous Vein: Due to patient pain, the patient requested that the examination be terminated therefore the superficial femoral vein through popliteal trifurcation could not be evaluated.  IMPRESSION: No evidence of deep venous thrombosis involving the upper common femoral vein. The patient requested that the examination be terminated and did not allow the sonographer to examine the lower extremity venous system from the level of the saphenous femoral junction through the popliteal trifurcation.   Electronically Signed   By: Conchita Paris M.D.   On: 11/09/2014 09:40   Dg Chest Portable 1 View  11/29/2014   CLINICAL DATA:  Altered mental status. Possible reaction to chemotherapy.  EXAM: PORTABLE CHEST - 1 VIEW  COMPARISON:  11/15/2014  FINDINGS: Mild-to-moderate enlargement of the cardiopericardial silhouette with cephalization of blood flow. Faint Kerley B-lines noted at the right lung base, but no airspace edema.  Right IJ Port-A-Cath tip: SVC.  Tortuous thoracic aorta.  Deformity from prior surgical neck fracture the right humerus which appears healed.  IMPRESSION: Mild to moderate enlargement of the cardiopericardial silhouette with faint interstitial edema. No airspace opacity identified.   Electronically Signed   By: Van Clines M.D.   On: 11/29/2014 13:31   Dg Chest Port 1 View  11/15/2014   CLINICAL DATA:  Shortness of breath and cough.  Weakness.  EXAM: PORTABLE CHEST - 1 VIEW  COMPARISON:  11/08/2014  FINDINGS: Tip of the right chest port remains in the SVC. The heart is at the upper limits normal in size. There are increased perihilar markings, progressed from prior exam. Increased right basilar atelectasis, now with mild left basilar atelectasis. No definite pleural effusion or pneumothorax.  IMPRESSION: Increased perihilar markings concerning pulmonary edema. Increased  right basilar atelectasis in development of mild left basilar  atelectasis.   Electronically Signed   By: Jeb Levering M.D.   On: 11/15/2014 04:43   Dg Chest Port 1 View  11/08/2014   CLINICAL DATA:  Acute onset of sepsis.  Initial encounter.  EXAM: PORTABLE CHEST - 1 VIEW  COMPARISON:  Chest radiograph performed 11/12/2013, and CTA of the chest performed 10/30/2014  FINDINGS: The lungs are well-aerated. Mild vascular congestion is noted, with mild right basilar atelectasis. There is no evidence of pleural effusion or pneumothorax.  The cardiomediastinal silhouette is within normal limits. No acute osseous abnormalities are seen. A right-sided chest port is noted ending about the mid SVC.  IMPRESSION: Mild vascular congestion, with mild right basilar atelectasis. Lungs otherwise clear.   Electronically Signed   By: Garald Balding M.D.   On: 11/08/2014 23:06   Dg Abd Portable 1v  12/05/2014   CLINICAL DATA:  OG tube placement.  EXAM: PORTABLE ABDOMEN - 1 VIEW  COMPARISON:  Single view of the abdomen earlier this same day.  FINDINGS: OG tube is not visualized with the tip just within the stomach.mm. Recommend advancement of 5 cm. The side port is in the distal esophagus.  IMPRESSION: OG tube tip is in the stomach with the side port in the esophagus. Recommend advancement of 5 cm.   Electronically Signed   By: Inge Rise M.D.   On: 12/05/2014 16:49   Dg Abd Portable 1v  12/05/2014   CLINICAL DATA:  NG tube placement.  EXAM: PORTABLE ABDOMEN - 1 VIEW  COMPARISON:  None.  FINDINGS: No NG tube is visualized. Left double-J ureteral stent is noted. Contrast material in the distal colon noted.  IMPRESSION: As above.   Electronically Signed   By: Inge Rise M.D.   On: 12/05/2014 16:38    Assessment:   Caleb Francis is a 69 y.o. male with mantle cell lymphoma undergoing chemo admitted 6/15 from oncology clinic with AMS, chest pain.  He was found to have a UTI with U CX + and bacteremia with Klebsiella oxytoca and had been on ceftriaxone until 6/24 when he  developed worsening confusion, chills and hemodynamic instability at time of platelet transfusion.  He has been neutropenic as well and is getting GCSF with his chemo.  He also has profound metabolic derangement with hypernatremia, ARF.  He may also have an ileus and has NGT in place. Repeat bcx are pending. Remains neutropenic, and with TCP.    Recommendations To cover the Klebsiella oxytoca and cover for neutropenia would change ceftriaxone to ceftazidime. Cont vanco but if bcx negative in 72 hours would stop Cont supportive care  Thank you very much for allowing me to participate in the care of this patient. Please call with questions.   Cheral Marker. Ola Spurr, MD

## 2014-12-07 NOTE — Progress Notes (Signed)
[facility] Hematology/Oncology Progress Note  Date of admission: 11/29/2014  Hospital day:  12/07/2014  Chief Complaint: Caleb Francis is a 69 y.o. 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  Subjective: The patient is resting comfortably in bed.  He is not agitated.  Open eyes on command x 1.  Significant GI bleed this morning after NG tube repositioning.  Social History: The patient is accompanied by his son today.  Allergies: No Known Allergies  Scheduled Medications: . sodium chloride  250 mL Intravenous Once  . acetaminophen  650 mg Oral Once  . acetaminophen  650 mg Oral Once  . antiseptic oral rinse  7 mL Mouth Rinse BID  . aspirin  324 mg Oral Once  . cefTAZidime (FORTAZ)  IV  2 g Intravenous Q24H  . diphenhydrAMINE      . diphenhydrAMINE  25 mg Intravenous Once  . docusate  50 mg Oral BID  . filgrastim  480 mcg Subcutaneous Once  . filgrastim  480 mcg Subcutaneous Daily  . insulin aspart  1-3 Units Subcutaneous Q6H  . lactulose  30 g Oral BID  . methylPREDNISolone (SOLU-MEDROL) injection  60 mg Intravenous Once  . scopolamine  1 patch Transdermal Q72H  . sennosides  5 mL Oral BID  . sodium chloride  3 mL Intravenous Q12H  . thiamine IV  100 mg Intravenous Daily  . [START ON 12/08/2014] Vancomycin  750 mg Intravenous Q24H  . ziprasidone  10 mg Intramuscular QHS    Review of Systems:  Unable to be obtained. Patient poorly responsive and intermittently combative. PERFORMANCE STATUS (ECOG): 4 GI: Significannt blood from NG tube (dark).  Constipation.  GU: Urine output good. Neuro:  Less agitated.  Physical Exam: Blood pressure 131/77, pulse 116, temperature 98.4 F (36.9 C), temperature source Axillary, resp. rate 29, height 6' (1.829 m), weight 153 lb 10.6 oz (69.7 kg), SpO2 100 %.  GENERAL: Chronically ill appearing gentleman lying in  bed in the ICU. MENTAL STATUS: Resting comfortably.   HEAD: Thin short graying hair. Normocephalic, atraumatic, face symmetric, no Cushingoid features. EYES: Brown eyes. Pupils equal round and reactive to light and accomodation. No conjunctivitis or scleral icterus. ENT: NG tube in place. Dark blood in collection container.  Oropharynx dry. Poor dentition.  RESPIRATORY: No rales, wheezes or rhonchi. CARDIOVASCULAR: Regular rate and rhythm without murmur, rub or gallop. ABDOMEN: Soft, with active bowel sounds, and no hepatosplenomegaly. No apparent tenderness. No masses. SKIN: No rashes, ulcers or lesions. EXTREMITIES: No edema, no skin discoloration or tenderness. No palpable cords. NEUROLOGICAL:  Opens eyes once on command.  Does not squeeze hand.  Less agitated.  Results for orders placed or performed during the hospital encounter of 11/29/14 (from the past 48 hour(s))  Glucose, capillary     Status: Abnormal   Collection Time: 12/05/14  6:22 PM  Result Value Ref Range   Glucose-Capillary 172 (H) 65 - 99 mg/dL  Basic metabolic panel     Status: Abnormal   Collection Time: 12/05/14  8:16 PM  Result Value Ref Range   Sodium 158 (H) 135 - 145 mmol/L   Potassium 3.7 3.5 - 5.1 mmol/L   Chloride >130 (HH) 101 - 111 mmol/L    Comment: CRITICAL RESULT CALLED TO, READ BACK BY AND VERIFIED WITH BETH BUONO AT 2135 12/05/2014 BY TFK    CO2 19 (L) 22 -  32 mmol/L   Glucose, Bld 146 (H) 65 - 99 mg/dL   BUN 48 (H) 6 - 20 mg/dL   Creatinine, Ser 2.13 (H) 0.61 - 1.24 mg/dL   Calcium 8.9 8.9 - 10.3 mg/dL   GFR calc non Af Amer 30 (L) >60 mL/min   GFR calc Af Amer 35 (L) >60 mL/min    Comment: (NOTE) The eGFR has been calculated using the CKD EPI equation. This calculation has not been validated in all clinical situations. eGFR's persistently <60 mL/min signify possible Chronic Kidney Disease.    Anion gap SEE COMMENTS 5 - 15    Comment: UNABLE TO CALCULATE MPG  Glucose,  capillary     Status: Abnormal   Collection Time: 12/06/14 12:05 AM  Result Value Ref Range   Glucose-Capillary 133 (H) 65 - 99 mg/dL  Comprehensive metabolic panel     Status: Abnormal   Collection Time: 12/06/14  4:37 AM  Result Value Ref Range   Sodium 154 (H) 135 - 145 mmol/L   Potassium 4.2 3.5 - 5.1 mmol/L   Chloride 130 (H) 101 - 111 mmol/L   CO2 18 (L) 22 - 32 mmol/L   Glucose, Bld 208 (H) 65 - 99 mg/dL   BUN 42 (H) 6 - 20 mg/dL   Creatinine, Ser 2.04 (H) 0.61 - 1.24 mg/dL   Calcium 8.6 (L) 8.9 - 10.3 mg/dL   Total Protein 5.9 (L) 6.5 - 8.1 g/dL   Albumin 2.2 (L) 3.5 - 5.0 g/dL   AST 28 15 - 41 U/L   ALT 20 17 - 63 U/L   Alkaline Phosphatase 141 (H) 38 - 126 U/L   Total Bilirubin 2.2 (H) 0.3 - 1.2 mg/dL   GFR calc non Af Amer 32 (L) >60 mL/min   GFR calc Af Amer 37 (L) >60 mL/min    Comment: (NOTE) The eGFR has been calculated using the CKD EPI equation. This calculation has not been validated in all clinical situations. eGFR's persistently <60 mL/min signify possible Chronic Kidney Disease.    Anion gap 6 5 - 15  Magnesium     Status: None   Collection Time: 12/06/14  4:37 AM  Result Value Ref Range   Magnesium 1.9 1.7 - 2.4 mg/dL  Phosphorus     Status: Abnormal   Collection Time: 12/06/14  4:37 AM  Result Value Ref Range   Phosphorus 2.4 (L) 2.5 - 4.6 mg/dL  Triglycerides     Status: Abnormal   Collection Time: 12/06/14  4:37 AM  Result Value Ref Range   Triglycerides 262 (H) <150 mg/dL  CBC     Status: Abnormal   Collection Time: 12/06/14  4:37 AM  Result Value Ref Range   WBC 1.0 (LL) 3.8 - 10.6 K/uL    Comment: CRITICAL RESULT CALLED TO, READ BACK BY AND VERIFIED WITH: ART STROTHMAN _0  12/06/14 BY AJO    RBC 2.50 (L) 4.40 - 5.90 MIL/uL   Hemoglobin 7.2 (L) 13.0 - 18.0 g/dL   HCT 21.6 (L) 40.0 - 52.0 %   MCV 86.6 80.0 - 100.0 fL   MCH 28.9 26.0 - 34.0 pg   MCHC 33.4 32.0 - 36.0 g/dL   RDW 20.3 (H) 11.5 - 14.5 %   Platelets 9 (LL) 150 - 400 K/uL     Comment: CRITICAL RESULT CALLED TO, READ BACK BY AND VERIFIED WITH: ART STROTHMAN _1  12/06/14 BY AJO PLATELET COUNT CONFIRMED BY SMEAR   Prealbumin     Status: Abnormal  Collection Time: 12/06/14  4:38 AM  Result Value Ref Range   Prealbumin 10.5 (L) 18 - 38 mg/dL    Comment: Performed at [facility]  Glucose, capillary     Status: Abnormal   Collection Time: 12/06/14  5:50 AM  Result Value Ref Range   Glucose-Capillary 155 (H) 65 - 99 mg/dL  Glucose, capillary     Status: Abnormal   Collection Time: 12/06/14  7:49 AM  Result Value Ref Range   Glucose-Capillary 158 (H) 65 - 99 mg/dL  Glucose, capillary     Status: Abnormal   Collection Time: 12/06/14 11:02 AM  Result Value Ref Range   Glucose-Capillary 173 (H) 65 - 99 mg/dL  Prepare Pheresed Platelets     Status: None (Preliminary result)   Collection Time: 12/06/14  1:00 PM  Result Value Ref Range   Unit Number [id number]    Blood Component Type PLTPHER LI2    Unit division 00    Status of Unit ISSUED    Transfusion Status OK TO TRANSFUSE   Prepare Pheresed Platelets     Status: None   Collection Time: 12/06/14  2:00 PM  Result Value Ref Range   Unit Number [id number]    Blood Component Type PLTPHER LRI1    Unit division 00    Status of Unit ISSUED,FINAL    Transfusion Status OK TO TRANSFUSE    Unit Number [id number]    Blood Component Type PLTP LI1 PAS    Unit division 00    Status of Unit ISSUED,FINAL    Transfusion Status OK TO TRANSFUSE   Glucose, capillary     Status: Abnormal   Collection Time: 12/06/14  3:01 PM  Result Value Ref Range   Glucose-Capillary 165 (H) 65 - 99 mg/dL  Glucose, capillary     Status: Abnormal   Collection Time: 12/06/14  6:18 PM  Result Value Ref Range   Glucose-Capillary 149 (H) 65 - 99 mg/dL  Culture, blood (routine x 2)     Status: None (Preliminary result)   Collection Time: 12/06/14  8:08 PM  Result Value Ref Range   Specimen Description BLOOD     Special Requests NONE    Culture NO GROWTH < 12 HOURS    Report Status PENDING   Culture, blood (routine x 2)     Status: None (Preliminary result)   Collection Time: 12/06/14  8:40 PM  Result Value Ref Range   Specimen Description BLOOD    Special Requests NONE    Culture NO GROWTH < 12 HOURS    Report Status PENDING   Glucose, capillary     Status: Abnormal   Collection Time: 12/06/14 11:39 PM  Result Value Ref Range   Glucose-Capillary 207 (H) 65 - 99 mg/dL  CBC     Status: Abnormal   Collection Time: 12/07/14  5:55 AM  Result Value Ref Range   WBC 0.6 (LL) 3.8 - 10.6 K/uL    Comment: RESULT REPEATED AND VERIFIED CRITICAL VALUE NOTED.  VALUE IS CONSISTENT WITH PREVIOUSLY REPORTED AND CALLED VALUE. PREVIOUSLY CALLED AT 0530 12/06/14.PMH    RBC 2.48 (L) 4.40 - 5.90 MIL/uL   Hemoglobin 7.0 (L) 13.0 - 18.0 g/dL   HCT 21.4 (L) 40.0 - 52.0 %   MCV 86.3 80.0 - 100.0 fL   MCH 28.3 26.0 - 34.0 pg   MCHC 32.8 32.0 - 36.0 g/dL   RDW 19.9 (H) 11.5 - 14.5 %   Platelets 11 (LL) 150 -  440 K/uL    Comment: RESULT REPEATED AND VERIFIED CRITICAL VALUE NOTED.  VALUE IS CONSISTENT WITH PREVIOUSLY REPORTED AND CALLED VALUE. PREVIOUSLY CALLED AT 0530 12/06/14.PMH PLATELET COUNT CONFIRMED BY SMEAR   Basic metabolic panel     Status: Abnormal   Collection Time: 12/07/14  5:55 AM  Result Value Ref Range   Sodium 156 (H) 135 - 145 mmol/L   Potassium 4.4 3.5 - 5.1 mmol/L   Chloride 125 (H) 101 - 111 mmol/L   CO2 21 (L) 22 - 32 mmol/L   Glucose, Bld 253 (H) 65 - 99 mg/dL   BUN 46 (H) 6 - 20 mg/dL   Creatinine, Ser 2.57 (H) 0.61 - 1.24 mg/dL   Calcium 8.9 8.9 - 10.3 mg/dL   GFR calc non Af Amer 24 (L) >60 mL/min   GFR calc Af Amer 28 (L) >60 mL/min    Comment: (NOTE) The eGFR has been calculated using the CKD EPI equation. This calculation has not been validated in all clinical situations. eGFR's persistently <60 mL/min signify possible Chronic Kidney Disease.    Anion gap 10 5 - 15   Magnesium     Status: None   Collection Time: 12/07/14  5:55 AM  Result Value Ref Range   Magnesium 2.3 1.7 - 2.4 mg/dL  Phosphorus     Status: None   Collection Time: 12/07/14  5:55 AM  Result Value Ref Range   Phosphorus 4.0 2.5 - 4.6 mg/dL  Glucose, capillary     Status: Abnormal   Collection Time: 12/07/14  8:18 AM  Result Value Ref Range   Glucose-Capillary 226 (H) 65 - 99 mg/dL  Glucose, capillary     Status: Abnormal   Collection Time: 12/07/14 11:39 AM  Result Value Ref Range   Glucose-Capillary 259 (H) 65 - 99 mg/dL  Glucose, capillary     Status: Abnormal   Collection Time: 12/07/14  1:28 PM  Result Value Ref Range   Glucose-Capillary 228 (H) 65 - 99 mg/dL   Dg Chest 1 View  12/06/2014   CLINICAL DATA:  Pulmonary congestion.  EXAM: CHEST  1 VIEW  COMPARISON:  12/06/2014  FINDINGS: Nasogastric tube is in place with tip off the film but beyond the gastroesophageal junction. Gaseous distension of a viscus loop partially imaged.  Right-sided Port-A-Cath tip overlies the level of superior vena cava. The heart is enlarged. Suspect small right pleural effusion. Right basilar opacity is stable.  IMPRESSION: 1. Cardiomegaly. 2. Right basilar opacity and probable effusion. 3. Dilated viscus only partially imaged, likely stomach or colon.   Electronically Signed   By: Elizabeth  Brown M.D.   On: 12/06/2014 19:32   Dg Chest 1 View  12/06/2014   CLINICAL DATA:  NG tube placement  EXAM: CHEST  1 VIEW  COMPARISON:  11/29/2014  FINDINGS: Cardiomegaly again noted. Right IJ Port-A-Cath is unchanged in position. NG tube in place with tip in proximal stomach. There is hazy right basilar atelectasis or infiltrate. No pulmonary edema.  IMPRESSION: NG tube with tip in proximal stomach. Hazy right basilar atelectasis or infiltrate. No pulmonary edema.   Electronically Signed   By: Liviu  Pop M.D.   On: 12/06/2014 14:40   Dg Abd 1 View  12/07/2014   CLINICAL DATA:  Abdominal distention  EXAM: ABDOMEN  - 1 VIEW  COMPARISON:  12/06/2014; 12/05/2014  FINDINGS: Interval removal of enteric tube.  Worsening severe gas distention of the stomach. Worsening moderate gas distention of the transverse colon with index   loop measuring approximately 6.5 cm in diameter.  No supine evidence of pneumoperitoneum. No definite pneumatosis or portal venous gas. Enteric contrast remains within the rectum.  Similar appearance of left-sided double-J ureteral stent.  Limited visualization lower thorax demonstrates enlarged cardiac silhouette and perihilar opacities, likely atelectasis.  No definite acute osseus abnormalities.  IMPRESSION: 1. Interval removal of enteric tube with worsening severe gaseous distention of the stomach, nonspecific though could be seen in the setting of gastric outlet obstruction. 2. Worsening gaseous distention of the colon with residual contrast seen within the rectum nonspecific though could be seen in the setting of ileus.   Electronically Signed   By: Sandi Mariscal M.D.   On: 12/07/2014 08:56   Dg Abd 1 View  12/06/2014   CLINICAL DATA:  Nasogastric tube placement.  EXAM: ABDOMEN - 1 VIEW  COMPARISON:  12/05/2014  FINDINGS: The gastric bubble is distended. The nasogastric tube tip is obscured by motion artifact but thought to be in the vicinity of the gastric cardia. Left renal stent noted. There continues to be some barium in the rectum.  IMPRESSION: 1. Nasogastric tube is indistinct due to motion artifact, but thought to terminate in the gastric cardia. Distended stomach bubble. 2. Left ureteral stent.   Electronically Signed   By: Van Clines M.D.   On: 12/06/2014 14:36   Dg Abd Portable 1v  12/05/2014   CLINICAL DATA:  OG tube placement.  EXAM: PORTABLE ABDOMEN - 1 VIEW  COMPARISON:  Single view of the abdomen earlier this same day.  FINDINGS: OG tube is not visualized with the tip just within the stomach.mm. Recommend advancement of 5 cm. The side port is in the distal esophagus.   IMPRESSION: OG tube tip is in the stomach with the side port in the esophagus. Recommend advancement of 5 cm.   Electronically Signed   By: Inge Rise M.D.   On: 12/05/2014 16:49   Dg Abd Portable 1v  12/05/2014   CLINICAL DATA:  NG tube placement.  EXAM: PORTABLE ABDOMEN - 1 VIEW  COMPARISON:  None.  FINDINGS: No NG tube is visualized. Left double-J ureteral stent is noted. Contrast material in the distal colon noted.  IMPRESSION: As above.   Electronically Signed   By: Inge Rise M.D.   On: 12/05/2014 16:38    Assessment:  Caleb Francis is a 69 y.o. 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 He was admitted with Klebsiella sepsis due to pyelonephritis and associated altered mental status. He subsequently developed renal insufficiency with assciated hypernatremia. He remains in the ICU. He has ICU psychosis. He had an apparent platelet transfusion reaction on 12/06/2014.  He has an upper GI bleed.  Plan: 1. Hematology/Oncology:  Day 11 s/p chemotherapy.  Patient neutropenic.  Drop in counts likely due to consumption (infection and bleeding).  Continue daily GCSF until Golinda > 5000.  With active GI bleed, keep platelets > 50,000.  Platelet transfusion ordered.  Transfuse 2 units of PRBCs today.  Follow-up counts after transfusion.  Check coags (ensure no evidence of DIC).  Premed all blood products with Tylenol and Benadryl. 2. Infectious disease: Appreciate ID consult.  Ceftriaxone switched to cetazidime.  On vancomycin.  Following cultures.  3. Nephrology:  Hypernatremia and renal insufficiency. 4. Neurology:  Metabolic encephalopathy and ICU psychosis. 5. Disposition:  Remains guarded.  DNR code status.   Lequita Asal, MD  12/07/2014, 2:49 PM

## 2014-12-07 NOTE — Progress Notes (Signed)
[facility] Hematology/Oncology Progress Note  Date of admission: 11/29/2014  Hospital day:  12/06/2014  Chief Complaint: Caleb Francis is a 69 y.o. 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  Subjective:   By report, the patient more alert today, following some commands and interacting with family.  On my arrival, patient poorly responsive, not following commands after an apparent platelet transfusion reaction.  Social History: The patient is accompanied by his son.  Allergies: No Known Allergies  Scheduled Medications: . acetaminophen  650 mg Oral Once  . antiseptic oral rinse  7 mL Mouth Rinse BID  . aspirin  324 mg Oral Once  . cefTRIAXone (ROCEPHIN)  IV  1 g Intravenous Q24H  . docusate  50 mg Oral BID  . filgrastim  480 mcg Subcutaneous Once  . filgrastim  480 mcg Subcutaneous Daily  . insulin aspart  1-3 Units Subcutaneous Q6H  . lactulose  30 g Oral BID  . methylPREDNISolone (SOLU-MEDROL) injection  60 mg Intravenous Once  . methylPREDNISolone sodium succinate      . piperacillin-tazobactam (ZOSYN)  IV  4.5 g Intravenous 3 times per day  . scopolamine  1 patch Transdermal Q72H  . sennosides  5 mL Oral BID  . sodium chloride  3 mL Intravenous Q12H  . thiamine IV  100 mg Intravenous Daily  . vancomycin  1,000 mg Intravenous Q24H  . ziprasidone  10 mg Intramuscular QHS    Review of Systems:  Unable to be obtained. Patient poorly responsive and intermittently combative. PERFORMANCE STATUS (ECOG): 4 GI: Constipation by report. GU:  Urine output good.  Physical Exam: Blood pressure 146/83, pulse 115, temperature 98.8 F (37.1 C), temperature source Axillary, resp. rate 24, height 6' (1.829 m), weight 153 lb 10.6 oz (69.7 kg), SpO2 100 %.  GENERAL: Chronically ill appearing gentleman lying in bed in the ICU. MENTAL STATUS: Predominantly making  chattering sounds. Unable to cooperate.[name] HEAD: Thin short graying hair. Normocephalic, atraumatic, face symmetric, no Cushingoid features. EYES: Brown eyes. Pupils equal round and reactive to light and accomodation. No conjunctivitis or scleral icterus. ENT: NG tube in place. Oropharynx dry. Poor dentition.  RESPIRATORY: Coarse upper airway breath sounds.  No wheezes. CARDIOVASCULAR: Regular rate and rhythm without murmur, rub or gallop. ABDOMEN: Soft, with active bowel sounds, and no hepatosplenomegaly. No apparent tenderness.  No masses. SKIN: No rashes, ulcers or lesions. EXTREMITIES: No edema, no skin discoloration or tenderness. No palpable cords. NEUROLOGICAL: Unable to follow commands..  Results for orders placed or performed during the hospital encounter of 11/29/14 (from the past 48 hour(s))  Type and screen     Status: None   Collection Time: 12/05/14  6:10 AM  Result Value Ref Range   ABO/RH(D) O POS    Antibody Screen NEG    Sample Expiration 12/08/2014   Prepare Pheresed Platelets     Status: None   Collection Time: 12/05/14  6:10 AM  Result Value Ref Range   Unit Number [id number]    Blood Component Type PLTPH LI3 PAS    Unit division 00    Status of Unit ISSUED,FINAL    Transfusion Status OK TO TRANSFUSE   Glucose, capillary     Status: Abnormal   Collection Time: 12/05/14 11:56 AM  Result Value Ref Range   Glucose-Capillary 159 (H) 65 - 99 mg/dL  Glucose, capillary  Status: Abnormal   Collection Time: 12/05/14  6:22 PM  Result Value Ref Range   Glucose-Capillary 172 (H) 65 - 99 mg/dL  Basic metabolic panel     Status: Abnormal   Collection Time: 12/05/14  8:16 PM  Result Value Ref Range   Sodium 158 (H) 135 - 145 mmol/L   Potassium 3.7 3.5 - 5.1 mmol/L   Chloride >130 (HH) 101 - 111 mmol/L    Comment: CRITICAL RESULT CALLED TO, READ BACK BY AND VERIFIED WITH BETH BUONO AT 2135 12/05/2014 BY TFK    CO2 19 (L) 22 - 32 mmol/L   Glucose,  Bld 146 (H) 65 - 99 mg/dL   BUN 48 (H) 6 - 20 mg/dL   Creatinine, Ser 2.13 (H) 0.61 - 1.24 mg/dL   Calcium 8.9 8.9 - 10.3 mg/dL   GFR calc non Af Amer 30 (L) >60 mL/min   GFR calc Af Amer 35 (L) >60 mL/min    Comment: (NOTE) The eGFR has been calculated using the CKD EPI equation. This calculation has not been validated in all clinical situations. eGFR's persistently <60 mL/min signify possible Chronic Kidney Disease.    Anion gap SEE COMMENTS 5 - 15    Comment: UNABLE TO CALCULATE MPG  Glucose, capillary     Status: Abnormal   Collection Time: 12/06/14 12:05 AM  Result Value Ref Range   Glucose-Capillary 133 (H) 65 - 99 mg/dL  Comprehensive metabolic panel     Status: Abnormal   Collection Time: 12/06/14  4:37 AM  Result Value Ref Range   Sodium 154 (H) 135 - 145 mmol/L   Potassium 4.2 3.5 - 5.1 mmol/L   Chloride 130 (H) 101 - 111 mmol/L   CO2 18 (L) 22 - 32 mmol/L   Glucose, Bld 208 (H) 65 - 99 mg/dL   BUN 42 (H) 6 - 20 mg/dL   Creatinine, Ser 2.04 (H) 0.61 - 1.24 mg/dL   Calcium 8.6 (L) 8.9 - 10.3 mg/dL   Total Protein 5.9 (L) 6.5 - 8.1 g/dL   Albumin 2.2 (L) 3.5 - 5.0 g/dL   AST 28 15 - 41 U/L   ALT 20 17 - 63 U/L   Alkaline Phosphatase 141 (H) 38 - 126 U/L   Total Bilirubin 2.2 (H) 0.3 - 1.2 mg/dL   GFR calc non Af Amer 32 (L) >60 mL/min   GFR calc Af Amer 37 (L) >60 mL/min    Comment: (NOTE) The eGFR has been calculated using the CKD EPI equation. This calculation has not been validated in all clinical situations. eGFR's persistently <60 mL/min signify possible Chronic Kidney Disease.    Anion gap 6 5 - 15  Magnesium     Status: None   Collection Time: 12/06/14  4:37 AM  Result Value Ref Range   Magnesium 1.9 1.7 - 2.4 mg/dL  Phosphorus     Status: Abnormal   Collection Time: 12/06/14  4:37 AM  Result Value Ref Range   Phosphorus 2.4 (L) 2.5 - 4.6 mg/dL  Triglycerides     Status: Abnormal   Collection Time: 12/06/14  4:37 AM  Result Value Ref Range    Triglycerides 262 (H) <150 mg/dL  CBC     Status: Abnormal   Collection Time: 12/06/14  4:37 AM  Result Value Ref Range   WBC 1.0 (LL) 3.8 - 10.6 K/uL    Comment: CRITICAL RESULT CALLED TO, READ BACK BY AND VERIFIED WITH: ART STROTHMAN _0  12/06/14 BY Huntley Dec  RBC 2.50 (L) 4.40 - 5.90 MIL/uL   Hemoglobin 7.2 (L) 13.0 - 18.0 g/dL   HCT 21.6 (L) 40.0 - 52.0 %   MCV 86.6 80.0 - 100.0 fL   MCH 28.9 26.0 - 34.0 pg   MCHC 33.4 32.0 - 36.0 g/dL   RDW 20.3 (H) 11.5 - 14.5 %   Platelets 9 (LL) 150 - 400 K/uL    Comment: CRITICAL RESULT CALLED TO, READ BACK BY AND VERIFIED WITH: ART STROTHMAN _0  12/06/14 BY AJO PLATELET COUNT CONFIRMED BY SMEAR   Prealbumin     Status: Abnormal   Collection Time: 12/06/14  4:38 AM  Result Value Ref Range   Prealbumin 10.5 (L) 18 - 38 mg/dL    Comment: Performed at [facility]  Glucose, capillary     Status: Abnormal   Collection Time: 12/06/14  5:50 AM  Result Value Ref Range   Glucose-Capillary 155 (H) 65 - 99 mg/dL  Glucose, capillary     Status: Abnormal   Collection Time: 12/06/14  7:49 AM  Result Value Ref Range   Glucose-Capillary 158 (H) 65 - 99 mg/dL  Glucose, capillary     Status: Abnormal   Collection Time: 12/06/14 11:02 AM  Result Value Ref Range   Glucose-Capillary 173 (H) 65 - 99 mg/dL  Prepare Pheresed Platelets     Status: None (Preliminary result)   Collection Time: 12/06/14  2:00 PM  Result Value Ref Range   Unit Number [id number]    Blood Component Type PLTPHER LRI1    Unit division 00    Status of Unit ISSUED    Transfusion Status OK TO TRANSFUSE    Unit Number [id number]    Blood Component Type PLTP LI1 PAS    Unit division 00    Status of Unit ISSUED    Transfusion Status OK TO TRANSFUSE   Glucose, capillary     Status: Abnormal   Collection Time: 12/06/14  3:01 PM  Result Value Ref Range   Glucose-Capillary 165 (H) 65 - 99 mg/dL  Glucose, capillary     Status: Abnormal   Collection Time: 12/06/14   6:18 PM  Result Value Ref Range   Glucose-Capillary 149 (H) 65 - 99 mg/dL  Glucose, capillary     Status: Abnormal   Collection Time: 12/06/14 11:39 PM  Result Value Ref Range   Glucose-Capillary 207 (H) 65 - 99 mg/dL   Dg Chest 1 View  12/06/2014   CLINICAL DATA:  Pulmonary congestion.  EXAM: CHEST  1 VIEW  COMPARISON:  12/06/2014  FINDINGS: Nasogastric tube is in place with tip off the film but beyond the gastroesophageal junction. Gaseous distension of a viscus loop partially imaged.  Right-sided Port-A-Cath tip overlies the level of superior vena cava. The heart is enlarged. Suspect small right pleural effusion. Right basilar opacity is stable.  IMPRESSION: 1. Cardiomegaly. 2. Right basilar opacity and probable effusion. 3. Dilated viscus only partially imaged, likely stomach or colon.   Electronically Signed   By: Nolon Nations M.D.   On: 12/06/2014 19:32   Dg Chest 1 View  12/06/2014   CLINICAL DATA:  NG tube placement  EXAM: CHEST  1 VIEW  COMPARISON:  11/29/2014  FINDINGS: Cardiomegaly again noted. Right IJ Port-A-Cath is unchanged in position. NG tube in place with tip in proximal stomach. There is hazy right basilar atelectasis or infiltrate. No pulmonary edema.  IMPRESSION: NG tube with tip in proximal stomach. Hazy right basilar atelectasis or infiltrate. No pulmonary  edema.   Electronically Signed   By: Lahoma Crocker M.D.   On: 12/06/2014 14:40   Dg Abd 1 View  12/06/2014   CLINICAL DATA:  Nasogastric tube placement.  EXAM: ABDOMEN - 1 VIEW  COMPARISON:  12/05/2014  FINDINGS: The gastric bubble is distended. The nasogastric tube tip is obscured by motion artifact but thought to be in the vicinity of the gastric cardia. Left renal stent noted. There continues to be some barium in the rectum.  IMPRESSION: 1. Nasogastric tube is indistinct due to motion artifact, but thought to terminate in the gastric cardia. Distended stomach bubble. 2. Left ureteral stent.   Electronically Signed   By:  Van Clines M.D.   On: 12/06/2014 14:36   Dg Abd Portable 1v  12/05/2014   CLINICAL DATA:  OG tube placement.  EXAM: PORTABLE ABDOMEN - 1 VIEW  COMPARISON:  Single view of the abdomen earlier this same day.  FINDINGS: OG tube is not visualized with the tip just within the stomach.mm. Recommend advancement of 5 cm. The side port is in the distal esophagus.  IMPRESSION: OG tube tip is in the stomach with the side port in the esophagus. Recommend advancement of 5 cm.   Electronically Signed   By: Inge Rise M.D.   On: 12/05/2014 16:49   Dg Abd Portable 1v  12/05/2014   CLINICAL DATA:  NG tube placement.  EXAM: PORTABLE ABDOMEN - 1 VIEW  COMPARISON:  None.  FINDINGS: No NG tube is visualized. Left double-J ureteral stent is noted. Contrast material in the distal colon noted.  IMPRESSION: As above.   Electronically Signed   By: Inge Rise M.D.   On: 12/05/2014 16:38    Assessment:  Caleb Francis is a 69 y.o. 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  He was admitted with Klebsiella sepsis due to pyelonephritis.and associated altered mental status.  He subsequently developed renal insufficiency with assciated hypernatremia.  He has remains in the ICU. He has ICU psychosis.  He has had an apparent transfusion reaction.  Plan: 1. Hematology/Oncology:  On my arrival this evening, the patient had just started a platelet transfusion for platelet count of 9,000.  Nursing and his son were at bedside.  Per nursing, no premedications given.  He became agitated with change in respirations and apparent chilling.  His blood pressure was elevated.  He was afebrile.  He has a history of transfusion reaction.  The transfusion was stopped and the bag sent back to the blood bank for reaction evaluation.  He was given Benadryl 25 mg IV and Solumedrol 60 mg IV.  The patient did not improve.  He continued to have upper airway chattering sounds.  His blood  pressure dropped (90/60) and his heart rate remained elevated.  Exam revealed no wheezing.  A CXR was obtained (no pulmonary edema or new infiltrate).  Blood cultures were obtained.  Broad spectrum antibiotic coverage discussed with Dr. Posey Pronto.  Once acute reaction resolves, discussed transfusion of platelets with premedications.   2. Patient currently day 10 status post cycle #2 RICE.  Nadir counts (WBC 1000) with neutropenia.  Neutropenic precautions.  GCSF continues until [place] > 5000.  Anticipate low counts for 3 days.  As patient possibly septic, would keep platelets > 20,000.  Premedications with all transfusions should include Tylenol and Benadryl.  3. Infectious disease:  Klebsiella sepsis on Ceftriaxone.  With neutropenia and possible septic reaction today, agree with  broad spectrum antibiotics (vancomycin and Zosyn).  Blood cultures obtained before additional antibiotics started.  Blood culures q 24 hours prn temp >= 100.4. 4. Nephrology:  Renal insufficiency.  Hypernatremia slowly being corrected.  Hypokalemia corrected. 5. Neurology:  Metabolic encephalopathy.  Per son, slightly improved.   6. Disposition:  DNR.  Prognosis guarded.   Lequita Asal, MD  12/07/2014, 5:57 AM

## 2014-12-07 NOTE — Progress Notes (Signed)
[facility] NOTE  Pharmacy Consult for Electrolyte Management and Constipation Management Indication: ICU Status   No Known Allergies  Patient Measurements: Height: 6' (182.9 cm) Weight: 153 lb 10.6 oz (69.7 kg) IBW/kg (Calculated) : 77.6   Vital Signs: Temp: 98 F (36.7 C) (06/25 1030) Temp Source: Axillary (06/25 1030) BP: 133/72 mmHg (06/25 1000) Pulse Rate: 114 (06/25 1000) Intake/Output from previous day: 06/24 0701 - 06/25 0700 In: 2429.6 [I.V.:1294.6; Blood:230; NG/GT:855; IV Piggyback:50] Out: 3050 [Urine:2550; Emesis/NG output:500] Intake/Output from this shift: Total I/O In: 175 [I.V.:175] Out: -  Vent settings for last 24 hours:    Labs:  Recent Labs  12/05/14 0530 12/05/14 2016 12/06/14 0437 12/07/14 0555  WBC 1.7*  --  1.0* 0.6*  HGB 7.5*  --  7.2* 7.0*  HCT 23.2*  --  21.6* 21.4*  PLT 13*  --  9* 11*  CREATININE 2.37* 2.13* 2.04* 2.57*  MG 2.1  --  1.9 2.3  PHOS 2.5  --  2.4* 4.0  ALBUMIN  --   --  2.2*  --   PROT  --   --  5.9*  --   AST  --   --  28  --   ALT  --   --  20  --   ALKPHOS  --   --  141*  --   BILITOT  --   --  2.2*  --    Estimated Creatinine Clearance: 27.5 mL/min (by C-G formula based on Cr of 2.57). BMP Latest Ref Rng 12/07/2014 12/06/2014 12/05/2014  Glucose 65 - 99 mg/dL 253(H) 208(H) 146(H)  BUN 6 - 20 mg/dL 46(H) 42(H) 48(H)  Creatinine 0.61 - 1.24 mg/dL 2.57(H) 2.04(H) 2.13(H)  Sodium 135 - 145 mmol/L 156(H) 154(H) 158(H)  Potassium 3.5 - 5.1 mmol/L 4.4 4.2 3.7  Chloride 101 - 111 mmol/L 125(H) 130(H) >130(HH)  CO2 22 - 32 mmol/L 21(L) 18(L) 19(L)  Calcium 8.9 - 10.3 mg/dL 8.9 8.6(L) 8.9     Recent Labs  12/06/14 1818 12/06/14 2339 12/07/14 0818  GLUCAP 149* 207* 226*    Microbiology: Recent Results (from the past 720 hour(s))  Blood Culture (routine x 2)     Status: None   Collection Time: 11/08/14 10:21 PM  Result Value Ref Range Status   Specimen Description BLOOD  Final   Special Requests NONE  Final   Culture NO GROWTH 5 DAYS  Final   Report Status 11/13/2014 FINAL  Final  Blood Culture (routine x 2)     Status: None   Collection Time: 11/08/14 10:22 PM  Result Value Ref Range Status   Specimen Description BLOOD  Final   Special Requests NONE  Final   Culture NO GROWTH 5 DAYS  Final   Report Status 11/13/2014 FINAL  Final  Urine culture     Status: None   Collection Time: 11/09/14  2:30 AM  Result Value Ref Range Status   Specimen Description URINE, RANDOM  Final   Special Requests NONE  Final   Culture NO GROWTH 1 DAY  Final   Report Status 11/10/2014 FINAL  Final  Culture, blood (single)     Status: None   Collection Time: 11/29/14 11:09 AM  Result Value Ref Range Status   Specimen Description BLOOD  Final   Special Requests NONE  Final   Culture  Setup Time   Final    GRAM NEGATIVE RODS AEROBIC BOTTLE ONLY CRITICAL RESULT CALLED TO, READ BACK BY  AND VERIFIED WITH: SANDRA VORBA ON 11/30/14 AT 0825 BY JEF    Culture KLEBSIELLA OXYTOCA AEROBIC BOTTLE ONLY   Final   Report Status 12/04/2014 FINAL  Final   Organism ID, Bacteria KLEBSIELLA OXYTOCA  Final      Susceptibility   Klebsiella oxytoca - MIC*    AMPICILLIN >=32 RESISTANT Resistant     CEFTAZIDIME <=1 SENSITIVE Sensitive     CEFAZOLIN >=64 RESISTANT Resistant     CEFTRIAXONE <=1 SENSITIVE Sensitive     CIPROFLOXACIN <=0.25 SENSITIVE Sensitive     GENTAMICIN <=1 SENSITIVE Sensitive     IMIPENEM <=0.25 SENSITIVE Sensitive     TRIMETH/SULFA <=20 SENSITIVE Sensitive     CEFOXITIN <=4 SENSITIVE Sensitive     PIP/TAZO Value in next row Resistant      RESISTANT128    * KLEBSIELLA OXYTOCA  Urine culture     Status: None   Collection Time: 11/29/14 11:22 AM  Result Value Ref Range Status   Specimen Description URINE, CLEAN CATCH  Final   Special Requests NONE  Final   Culture >=100,000 COLONIES/mL KLEBSIELLA OXYTOCA  Final   Report Status 12/01/2014 FINAL  Final   Organism ID, Bacteria  KLEBSIELLA OXYTOCA  Final      Susceptibility   Klebsiella oxytoca - MIC*    AMPICILLIN >=32 RESISTANT Resistant     CEFAZOLIN >=64 RESISTANT Resistant     CEFTRIAXONE 4 SENSITIVE Sensitive     CIPROFLOXACIN <=0.25 SENSITIVE Sensitive     GENTAMICIN <=1 SENSITIVE Sensitive     IMIPENEM <=0.25 SENSITIVE Sensitive     NITROFURANTOIN <=16 SENSITIVE Sensitive     TRIMETH/SULFA <=20 SENSITIVE Sensitive     CEFOXITIN <=4 SENSITIVE Sensitive     * >=100,000 COLONIES/mL KLEBSIELLA OXYTOCA  MRSA PCR Screening     Status: None   Collection Time: 11/29/14 12:13 PM  Result Value Ref Range Status   MRSA by PCR NEGATIVE NEGATIVE Final    Comment:        The GeneXpert MRSA Assay (FDA approved for NASAL specimens only), is one component of a comprehensive MRSA colonization surveillance program. It is not intended to diagnose MRSA infection nor to guide or monitor treatment for MRSA infections.   Culture, blood (routine x 2)     Status: None   Collection Time: 11/29/14  1:19 PM  Result Value Ref Range Status   Specimen Description BLOOD  Final   Special Requests Immunocompromised  Final   Culture NO GROWTH 5 DAYS  Final   Report Status 12/04/2014 FINAL  Final  Urine culture     Status: None   Collection Time: 11/30/14 10:12 AM  Result Value Ref Range Status   Specimen Description URINE, CATHETERIZED  Final   Special Requests Immunocompromised  Final   Culture NO GROWTH 2 DAYS  Final   Report Status 12/02/2014 FINAL  Final  Culture, blood (routine x 2)     Status: None (Preliminary result)   Collection Time: 12/06/14  8:08 PM  Result Value Ref Range Status   Specimen Description BLOOD  Final   Special Requests NONE  Final   Culture NO GROWTH < 12 HOURS  Final   Report Status PENDING  Incomplete  Culture, blood (routine x 2)     Status: None (Preliminary result)   Collection Time: 12/06/14  8:40 PM  Result Value Ref Range Status   Specimen Description BLOOD  Final   Special Requests  NONE  Final   Culture NO GROWTH <  12 HOURS  Final   Report Status PENDING  Incomplete    Medications:  Scheduled:  . antiseptic oral rinse  7 mL Mouth Rinse BID  . aspirin  324 mg Oral Once  . cefTAZidime (FORTAZ)  IV  2 g Intravenous Q24H  . diphenhydrAMINE      . docusate  50 mg Oral BID  . filgrastim  480 mcg Subcutaneous Once  . filgrastim  480 mcg Subcutaneous Daily  . insulin aspart  1-3 Units Subcutaneous Q6H  . lactulose  30 g Oral BID  . methylPREDNISolone (SOLU-MEDROL) injection  60 mg Intravenous Once  . scopolamine  1 patch Transdermal Q72H  . sennosides  5 mL Oral BID  . sodium chloride  3 mL Intravenous Q12H  . thiamine IV  100 mg Intravenous Daily  . vancomycin  1,000 mg Intravenous Q24H  . ziprasidone  10 mg Intramuscular QHS   Infusions:  . dextrose 5 % with KCl 20 mEq / L 20 mEq (12/07/14 0900)  . feeding supplement (JEVITY 1.5 CAL/FIBER) 1,000 mL (12/06/14 1044)  . free water    . norepinephrine 4 mg (12/06/14 2017)    Assessment: 69 yo male with progressive mantle cell lymphoma. Patient being treated for electrolyte imbalance and AMS.  Goal of Therapy:  Resolution of Symptoms  Plan:   1. Electrolytes: Potassium has normalized. Patient now receiving D5W with 39mEq of potassium/Liter at 1108mL/hr. Will obtain follow-up labs in am.    2. Constipation: No BM charted. Patient currently ordered lactulose 28mL BID by neuro. Will continue docusate 19mL VT BID and senna 8.8mg  VT BID.   3. Delirium: Per neuro patient's delirium has worsened over the day. Please not that diazepam 5mg  IV Q4hr was discontinued after family refused administration prior to 1000 rounds.   Pharmacy will continue to monitor and adjust per consult.  Chinita Greenland PharmD Clinical Pharmacist 12/07/2014

## 2014-12-07 NOTE — Progress Notes (Signed)
Patient had an episode of projectile vomiting. It was a maroon approx 500cc. NGT put low wall suction. Vital signs stable. Dr. Jannifer Franklin was made aware.

## 2014-12-07 NOTE — Plan of Care (Signed)
Problem: Phase I Progression Outcomes Goal: OOB as tolerated unless otherwise ordered Outcome: Not Progressing BR. Critically ill

## 2014-12-07 NOTE — Progress Notes (Signed)
ANTIBIOTIC CONSULT NOTE - Follow up  Pharmacy Consult for ceftazidime/Vancomycin Indication: Febrile neutropenia  No Known Allergies  Patient Measurements: Height: 6' (182.9 cm) Weight: 153 lb 10.6 oz (69.7 kg) IBW/kg (Calculated) : 77.6   Vital Signs: Temp: 98 F (36.7 C) (06/25 1030) Temp Source: Axillary (06/25 1030) BP: 133/72 mmHg (06/25 1000) Pulse Rate: 114 (06/25 1000) Intake/Output from previous day: 06/24 0701 - 06/25 0700 In: 2429.6 [I.V.:1294.6; Blood:230; NG/GT:855; IV Piggyback:50] Out: 3050 [Urine:2550; Emesis/NG output:500] Intake/Output from this shift: Total I/O In: 175 [I.V.:175] Out: -   Labs:  Recent Labs  12/05/14 0530 12/05/14 2016 12/06/14 0437 12/07/14 0555  WBC 1.7*  --  1.0* 0.6*  HGB 7.5*  --  7.2* 7.0*  PLT 13*  --  9* 11*  CREATININE 2.37* 2.13* 2.04* 2.57*   Estimated Creatinine Clearance: 27.5 mL/min (by C-G formula based on Cr of 2.57).   Microbiology: Recent Results (from the past 720 hour(s))  Blood Culture (routine x 2)     Status: None   Collection Time: 11/08/14 10:21 PM  Result Value Ref Range Status   Specimen Description BLOOD  Final   Special Requests NONE  Final   Culture NO GROWTH 5 DAYS  Final   Report Status 11/13/2014 FINAL  Final  Blood Culture (routine x 2)     Status: None   Collection Time: 11/08/14 10:22 PM  Result Value Ref Range Status   Specimen Description BLOOD  Final   Special Requests NONE  Final   Culture NO GROWTH 5 DAYS  Final   Report Status 11/13/2014 FINAL  Final  Urine culture     Status: None   Collection Time: 11/09/14  2:30 AM  Result Value Ref Range Status   Specimen Description URINE, RANDOM  Final   Special Requests NONE  Final   Culture NO GROWTH 1 DAY  Final   Report Status 11/10/2014 FINAL  Final  Culture, blood (single)     Status: None   Collection Time: 11/29/14 11:09 AM  Result Value Ref Range Status   Specimen Description BLOOD  Final   Special Requests NONE  Final   Culture  Setup Time   Final    GRAM NEGATIVE RODS AEROBIC BOTTLE ONLY CRITICAL RESULT CALLED TO, READ BACK BY AND VERIFIED WITH: SANDRA VORBA ON 11/30/14 AT 0825 BY JEF    Culture KLEBSIELLA OXYTOCA AEROBIC BOTTLE ONLY   Final   Report Status 12/04/2014 FINAL  Final   Organism ID, Bacteria KLEBSIELLA OXYTOCA  Final      Susceptibility   Klebsiella oxytoca - MIC*    AMPICILLIN >=32 RESISTANT Resistant     CEFTAZIDIME <=1 SENSITIVE Sensitive     CEFAZOLIN >=64 RESISTANT Resistant     CEFTRIAXONE <=1 SENSITIVE Sensitive     CIPROFLOXACIN <=0.25 SENSITIVE Sensitive     GENTAMICIN <=1 SENSITIVE Sensitive     IMIPENEM <=0.25 SENSITIVE Sensitive     TRIMETH/SULFA <=20 SENSITIVE Sensitive     CEFOXITIN <=4 SENSITIVE Sensitive     PIP/TAZO Value in next row Resistant      RESISTANT128    * KLEBSIELLA OXYTOCA  Urine culture     Status: None   Collection Time: 11/29/14 11:22 AM  Result Value Ref Range Status   Specimen Description URINE, CLEAN CATCH  Final   Special Requests NONE  Final   Culture >=100,000 COLONIES/mL KLEBSIELLA OXYTOCA  Final   Report Status 12/01/2014 FINAL  Final   Organism ID, Bacteria KLEBSIELLA OXYTOCA  Final      Susceptibility   Klebsiella oxytoca - MIC*    AMPICILLIN >=32 RESISTANT Resistant     CEFAZOLIN >=64 RESISTANT Resistant     CEFTRIAXONE 4 SENSITIVE Sensitive     CIPROFLOXACIN <=0.25 SENSITIVE Sensitive     GENTAMICIN <=1 SENSITIVE Sensitive     IMIPENEM <=0.25 SENSITIVE Sensitive     NITROFURANTOIN <=16 SENSITIVE Sensitive     TRIMETH/SULFA <=20 SENSITIVE Sensitive     CEFOXITIN <=4 SENSITIVE Sensitive     * >=100,000 COLONIES/mL KLEBSIELLA OXYTOCA  MRSA PCR Screening     Status: None   Collection Time: 11/29/14 12:13 PM  Result Value Ref Range Status   MRSA by PCR NEGATIVE NEGATIVE Final    Comment:        The GeneXpert MRSA Assay (FDA approved for NASAL specimens only), is one component of a comprehensive MRSA colonization surveillance  program. It is not intended to diagnose MRSA infection nor to guide or monitor treatment for MRSA infections.   Culture, blood (routine x 2)     Status: None   Collection Time: 11/29/14  1:19 PM  Result Value Ref Range Status   Specimen Description BLOOD  Final   Special Requests Immunocompromised  Final   Culture NO GROWTH 5 DAYS  Final   Report Status 12/04/2014 FINAL  Final  Urine culture     Status: None   Collection Time: 11/30/14 10:12 AM  Result Value Ref Range Status   Specimen Description URINE, CATHETERIZED  Final   Special Requests Immunocompromised  Final   Culture NO GROWTH 2 DAYS  Final   Report Status 12/02/2014 FINAL  Final  Culture, blood (routine x 2)     Status: None (Preliminary result)   Collection Time: 12/06/14  8:08 PM  Result Value Ref Range Status   Specimen Description BLOOD  Final   Special Requests NONE  Final   Culture NO GROWTH < 12 HOURS  Final   Report Status PENDING  Incomplete  Culture, blood (routine x 2)     Status: None (Preliminary result)   Collection Time: 12/06/14  8:40 PM  Result Value Ref Range Status   Specimen Description BLOOD  Final   Special Requests NONE  Final   Culture NO GROWTH < 12 HOURS  Final   Report Status PENDING  Incomplete    Medical History: Past Medical History  Diagnosis Date  . Arthritis   . Hypertension   . RA (rheumatoid arthritis)   . Anemia   . Cancer     lymphoma  . History of nuclear stress test     a. 12/2013: low risk, no sig ischemia, no EKG changes, no artifact, EF 63%  . Chronic kidney disease      Assessment: 69 yo male with mantle cell lymphoma now on ceftazidime/vancomycin for coverage of neutropenia per ID note.   Goal of Therapy:  Resolution of infection   Plan:  Blood cx NGTD Patient ordered ceftazidime 2 g IV q24h. Patient ordered Vancomycin 1 gram IV Q24h, started 6/24. Scr increased. Will transition to Vancomycin 750mg  Q24h on 6/26. Vancomycin trough ordered for 6/27 at  0830 (not at steady state). Follow renal fxn.   Pharmacy will continue to follow  Chinita Greenland PharmD Clinical Pharmacist 12/07/2014

## 2014-12-07 NOTE — Progress Notes (Signed)
Drowsy but follows a few commands.  Denies pain. Speaks few slurred words. Has had 1 unit of platelets this shift with follow up platelet count =19.  Hgb 5.3 and WBC 1 phone report by lab at time of that platelet count. Dr Mike Gip aware. He is currently receiving 2nd unit of PRBC"S.  He will receive one more platelet pack that will be followed by labs. Ng output has decreased and has been light brown coffee colored .He has not had stool.  TF remains on hold Abdomen is soft and bowel sounds hypoactive. There has been no evidence of transfusion reaction.  He has received tylenol and benadryl pre-meds with each blood product.  Family member at bedside all day. They have spoken with Dr Benjie Karvonen, Dr Mike Gip, Dr Tamala Julian and Dr Ola Spurr today.  See Epic charting for further info

## 2014-12-07 NOTE — Progress Notes (Signed)
Neurology Note  S: Pt having a transfusion reaction now after platelets vs. Sepsis. Previous today, pt had good day and was closer to baseline.   ROS unobtainable secondary to mental status  O: AFVSS NL weight, mild distress Normocephalic, oropharynx clear CTA B, no wheezing Tachycardic still, no murmur No C/C/E  Sleepy but oriented to person, follows a few simple commands, severe dysarthria PERRLA, EOMI, face symmetric Moves all ext equally  A/P: 1. Encephalopathy- improved compared to the last two days but still lethargic; Likely a component of ICU psychosis as well. Pt also has complicating nutritional deficit and electrolyte abnormalities but likely worse due to infections - antibiotics per ID - continue Geodon 10mg  qHS regardless - continue lactulose 30gm BID - Continue thiamine 100mg  IV daily - PRN Geodon 10mg  q12h PRN agitation - ** Lights on during day and up in chair ** - would start nutrition as soon as tolerated - Will follow

## 2014-12-07 NOTE — Progress Notes (Signed)
ANTIBIOTIC CONSULT NOTE - INITIAL  Pharmacy Consult for ceftazidime Indication: Febrile neutropenia  No Known Allergies  Patient Measurements: Height: 6' (182.9 cm) Weight: 153 lb 10.6 oz (69.7 kg) IBW/kg (Calculated) : 77.6   Vital Signs: Temp: 97.5 F (36.4 C) (06/25 0715) Temp Source: Axillary (06/25 0715) BP: 111/65 mmHg (06/25 0900) Pulse Rate: 118 (06/25 0900) Intake/Output from previous day: 06/24 0701 - 06/25 0700 In: 2429.6 [I.V.:1294.6; Blood:230; NG/GT:855; IV Piggyback:50] Out: 3050 [Urine:2550; Emesis/NG output:500] Intake/Output from this shift: Total I/O In: 175 [I.V.:175] Out: -   Labs:  Recent Labs  12/05/14 0530 12/05/14 2016 12/06/14 0437 12/07/14 0555  WBC 1.7*  --  1.0* 0.6*  HGB 7.5*  --  7.2* 7.0*  PLT 13*  --  9* 11*  CREATININE 2.37* 2.13* 2.04* 2.57*   Estimated Creatinine Clearance: 27.5 mL/min (by C-G formula based on Cr of 2.57). No results for input(s): VANCOTROUGH, VANCOPEAK, VANCORANDOM, GENTTROUGH, GENTPEAK, GENTRANDOM, TOBRATROUGH, TOBRAPEAK, TOBRARND, AMIKACINPEAK, AMIKACINTROU, AMIKACIN in the last 72 hours.   Microbiology: Recent Results (from the past 720 hour(s))  Blood Culture (routine x 2)     Status: None   Collection Time: 11/08/14 10:21 PM  Result Value Ref Range Status   Specimen Description BLOOD  Final   Special Requests NONE  Final   Culture NO GROWTH 5 DAYS  Final   Report Status 11/13/2014 FINAL  Final  Blood Culture (routine x 2)     Status: None   Collection Time: 11/08/14 10:22 PM  Result Value Ref Range Status   Specimen Description BLOOD  Final   Special Requests NONE  Final   Culture NO GROWTH 5 DAYS  Final   Report Status 11/13/2014 FINAL  Final  Urine culture     Status: None   Collection Time: 11/09/14  2:30 AM  Result Value Ref Range Status   Specimen Description URINE, RANDOM  Final   Special Requests NONE  Final   Culture NO GROWTH 1 DAY  Final   Report Status 11/10/2014 FINAL  Final   Culture, blood (single)     Status: None   Collection Time: 11/29/14 11:09 AM  Result Value Ref Range Status   Specimen Description BLOOD  Final   Special Requests NONE  Final   Culture  Setup Time   Final    GRAM NEGATIVE RODS AEROBIC BOTTLE ONLY CRITICAL RESULT CALLED TO, READ BACK BY AND VERIFIED WITH: SANDRA VORBA ON 11/30/14 AT 0825 BY JEF    Culture KLEBSIELLA OXYTOCA AEROBIC BOTTLE ONLY   Final   Report Status 12/04/2014 FINAL  Final   Organism ID, Bacteria KLEBSIELLA OXYTOCA  Final      Susceptibility   Klebsiella oxytoca - MIC*    AMPICILLIN >=32 RESISTANT Resistant     CEFTAZIDIME <=1 SENSITIVE Sensitive     CEFAZOLIN >=64 RESISTANT Resistant     CEFTRIAXONE <=1 SENSITIVE Sensitive     CIPROFLOXACIN <=0.25 SENSITIVE Sensitive     GENTAMICIN <=1 SENSITIVE Sensitive     IMIPENEM <=0.25 SENSITIVE Sensitive     TRIMETH/SULFA <=20 SENSITIVE Sensitive     CEFOXITIN <=4 SENSITIVE Sensitive     PIP/TAZO Value in next row Resistant      RESISTANT128    * KLEBSIELLA OXYTOCA  Urine culture     Status: None   Collection Time: 11/29/14 11:22 AM  Result Value Ref Range Status   Specimen Description URINE, CLEAN CATCH  Final   Special Requests NONE  Final   Culture >=  100,000 COLONIES/mL KLEBSIELLA OXYTOCA  Final   Report Status 12/01/2014 FINAL  Final   Organism ID, Bacteria KLEBSIELLA OXYTOCA  Final      Susceptibility   Klebsiella oxytoca - MIC*    AMPICILLIN >=32 RESISTANT Resistant     CEFAZOLIN >=64 RESISTANT Resistant     CEFTRIAXONE 4 SENSITIVE Sensitive     CIPROFLOXACIN <=0.25 SENSITIVE Sensitive     GENTAMICIN <=1 SENSITIVE Sensitive     IMIPENEM <=0.25 SENSITIVE Sensitive     NITROFURANTOIN <=16 SENSITIVE Sensitive     TRIMETH/SULFA <=20 SENSITIVE Sensitive     CEFOXITIN <=4 SENSITIVE Sensitive     * >=100,000 COLONIES/mL KLEBSIELLA OXYTOCA  MRSA PCR Screening     Status: None   Collection Time: 11/29/14 12:13 PM  Result Value Ref Range Status   MRSA by PCR  NEGATIVE NEGATIVE Final    Comment:        The GeneXpert MRSA Assay (FDA approved for NASAL specimens only), is one component of a comprehensive MRSA colonization surveillance program. It is not intended to diagnose MRSA infection nor to guide or monitor treatment for MRSA infections.   Culture, blood (routine x 2)     Status: None   Collection Time: 11/29/14  1:19 PM  Result Value Ref Range Status   Specimen Description BLOOD  Final   Special Requests Immunocompromised  Final   Culture NO GROWTH 5 DAYS  Final   Report Status 12/04/2014 FINAL  Final  Urine culture     Status: None   Collection Time: 11/30/14 10:12 AM  Result Value Ref Range Status   Specimen Description URINE, CATHETERIZED  Final   Special Requests Immunocompromised  Final   Culture NO GROWTH 2 DAYS  Final   Report Status 12/02/2014 FINAL  Final  Culture, blood (routine x 2)     Status: None (Preliminary result)   Collection Time: 12/06/14  8:08 PM  Result Value Ref Range Status   Specimen Description BLOOD  Final   Special Requests NONE  Final   Culture NO GROWTH < 12 HOURS  Final   Report Status PENDING  Incomplete  Culture, blood (routine x 2)     Status: None (Preliminary result)   Collection Time: 12/06/14  8:40 PM  Result Value Ref Range Status   Specimen Description BLOOD  Final   Special Requests NONE  Final   Culture NO GROWTH < 12 HOURS  Final   Report Status PENDING  Incomplete    Medical History: Past Medical History  Diagnosis Date  . Arthritis   . Hypertension   . RA (rheumatoid arthritis)   . Anemia   . Cancer     lymphoma  . History of nuclear stress test     a. 12/2013: low risk, no sig ischemia, no EKG changes, no artifact, EF 63%  . Chronic kidney disease      Assessment: 69 yo male with mantle cell lymphoma on CTX being changed to ceftazidime for coverage of neutropenia per ID note.   Goal of Therapy:  Resolution of infection  Plan:  Based on CrCl < 30 ml/min, will  order ceftazidime 2 g IV q24h   Pharmacy will continue to follow  Rayna Sexton L 12/07/2014,10:22 AM

## 2014-12-07 NOTE — Progress Notes (Addendum)
[facility]                                                                                                                                                                                            Patient Demographics   Caleb Francis, is a 69 y.o. male, DOB - 1946-04-02, VFI:[id number]  Admit date - 11/29/2014   Admitting Physician Aldean Jewett, MD  Outpatient Primary MD for the patient is No PCP Per Patient   Subjective: Patient had what sounds like of platelet transfusion reaction yesterday. He was given IV steroids and Benadryl. He was started on broad-spectrum antibiotics for possible sepsis. Patient also now has a significant amount of dark material from NG tube.  Review of Systems:   Unable to provide  Vitals:   Filed Vitals:   12/07/14 1054 12/07/14 1100 12/07/14 1112 12/07/14 1253  BP: 123/74 128/65 114/71 131/77  Pulse: 113 115 114 116  Temp:   98.1 F (36.7 C) 98 F (36.7 C)  TempSrc:   Axillary Oral  Resp: 19 26 26 28   Height:      Weight:      SpO2: 100% 100% 100% 100%    Wt Readings from Last 3 Encounters:  12/06/14 69.7 kg (153 lb 10.6 oz)  11/27/14 68.2 kg (150 lb 5.7 oz)  11/25/14 70.761 kg (156 lb)     Intake/Output Summary (Last 24 hours) at 12/07/14 1259 Last data filed at 12/07/14 1253  Gross per 24 hour  Intake   2490 ml  Output   2775 ml  Net   -285 ml    Physical Exam:   GENERAL:  pt. Lying in bed in has NG tube HEAD, EYES, EARS, NOSE AND THROAT: Atraumatic, normocephalic. Extraocular muscles are intact. t. Sclerae anicteric.  NECK: Supple. There is no jugular venous distention. No bruits, no lymphadenopathy, no thyromegaly.  HEART:  tachycardic. No murmurs, no rubs, no clicks.  LUNGS:patient has upper airway wheezing no rales or rhonchi   ABDOMEN:  patient has a soft abdomen with hypoactive bowel sounds no rebound or guarding.  EXTREMITIES: No evidence of any cyanosis, clubbing, .   +2 pedal and radial pulses bilaterally.  NEUROLOGIC: patient not following commands this morning  SKIN: Moist and warm with no rashes appreciated.  Psych:    no agitation reported overnight.  Antibiotics   Anti-infectives    Start     Dose/Rate Route Frequency Ordered Stop   12/08/14 0900  vancomycin (VANCOCIN) IVPB 750 mg/150 ml premix     750 mg 150 mL/hr over 60 Minutes Intravenous Every 24  hours 12/07/14 1102     12/07/14 1030  cefTAZidime (FORTAZ) 2 g in dextrose 5 % 50 mL IVPB     2 g 100 mL/hr over 30 Minutes Intravenous Every 24 hours 12/07/14 1021     12/07/14 0700  vancomycin (VANCOCIN) IVPB 1000 mg/200 mL premix  Status:  Discontinued     1,000 mg 200 mL/hr over 60 Minutes Intravenous Every 24 hours 12/06/14 2225 12/07/14 1102   12/06/14 2200  piperacillin-tazobactam (ZOSYN) IVPB 4.5 g  Status:  Discontinued     4.5 g 200 mL/hr over 30 Minutes Intravenous 3 times per day 12/06/14 1946 12/07/14 1011   12/06/14 2000  vancomycin (VANCOCIN) IVPB 1000 mg/200 mL premix     1,000 mg 200 mL/hr over 60 Minutes Intravenous STAT 12/06/14 1947 12/06/14 2113   12/06/14 2000  cefTRIAXone (ROCEPHIN) 1 g in dextrose 5 % 50 mL IVPB - Premix  Status:  Discontinued     1 g 100 mL/hr over 30 Minutes Intravenous Every 24 hours 12/06/14 1959 12/07/14 1011   12/06/14 1945  piperacillin-tazobactam (ZOSYN) IVPB 3.375 g  Status:  Discontinued     3.375 g 100 mL/hr over 30 Minutes Intravenous 4 times per day 12/06/14 1940 12/06/14 2007   12/06/14 1945  vancomycin (VANCOCIN) IVPB 1000 mg/200 mL premix  Status:  Discontinued     1,000 mg 200 mL/hr over 60 Minutes Intravenous Every 12 hours 12/06/14 1940 12/06/14 2008   12/01/14 0945  piperacillin-tazobactam (ZOSYN) IVPB 3.375 g  Status:  Discontinued     3.375 g 12.5 mL/hr over 240 Minutes Intravenous 3 times per day 12/01/14 0936 12/03/14 1337   11/30/14 1000  cefTRIAXone (ROCEPHIN) 2 g in dextrose 5 % 50 mL IVPB - Premix  Status:  Discontinued     Comments:  Entered per MD progress note   2 g 100 mL/hr over 30 Minutes Intravenous Every 24 hours 11/29/14 2157 12/06/14 1940   11/29/14 1415  cefTRIAXone (ROCEPHIN) 1 g in dextrose 5 % 50 mL IVPB - Premix     1 g 100 mL/hr over 30 Minutes Intravenous  Once 11/29/14 1406 11/29/14 1452   11/29/14 1414  cefTRIAXone (ROCEPHIN) 20 MG/ML IVPB 50 mL    Comments:  MONAR, NELLIE: cabinet override      11/29/14 1414 11/29/14 1424      Medications   Scheduled Meds: . antiseptic oral rinse  7 mL Mouth Rinse BID  . aspirin  324 mg Oral Once  . cefTAZidime (FORTAZ)  IV  2 g Intravenous Q24H  . diphenhydrAMINE      . docusate  50 mg Oral BID  . filgrastim  480 mcg Subcutaneous Once  . filgrastim  480 mcg Subcutaneous Daily  . insulin aspart  1-3 Units Subcutaneous Q6H  . lactulose  30 g Oral BID  . methylPREDNISolone (SOLU-MEDROL) injection  60 mg Intravenous Once  . scopolamine  1 patch Transdermal Q72H  . sennosides  5 mL Oral BID  . sodium chloride  3 mL Intravenous Q12H  . thiamine IV  100 mg Intravenous Daily  . [START ON 12/08/2014] Vancomycin  750 mg Intravenous Q24H  . ziprasidone  10 mg Intramuscular QHS   Continuous Infusions: . dextrose 5 % with KCl 20 mEq / L 20 mEq (12/07/14 1000)  . feeding supplement (JEVITY 1.5 CAL/FIBER) 1,000 mL (12/06/14 1044)  . free water    . norepinephrine 4 mg (12/06/14 2017)   PRN Meds:.acetaminophen **OR** acetaminophen, haloperidol lactate, heparin lock  flush, heparin lock flush, heparin lock flush, heparin lock flush, magnesium hydroxide, metoprolol, morphine injection, sodium chloride, sodium chloride, sodium chloride, sodium chloride, ziprasidone   Data Review:   Micro Results Recent Results (from the past 240 hour(s))  Culture, blood (single)     Status: None   Collection Time: 11/29/14 11:09 AM  Result Value Ref Range Status   Specimen Description BLOOD  Final   Special Requests NONE  Final   Culture  Setup Time   Final    GRAM  NEGATIVE RODS AEROBIC BOTTLE ONLY CRITICAL RESULT CALLED TO, READ BACK BY AND VERIFIED WITH: SANDRA VORBA ON 11/30/14 AT 0825 BY JEF    Culture KLEBSIELLA OXYTOCA AEROBIC BOTTLE ONLY   Final   Report Status 12/04/2014 FINAL  Final   Organism ID, Bacteria KLEBSIELLA OXYTOCA  Final      Susceptibility   Klebsiella oxytoca - MIC*    AMPICILLIN >=32 RESISTANT Resistant     CEFTAZIDIME <=1 SENSITIVE Sensitive     CEFAZOLIN >=64 RESISTANT Resistant     CEFTRIAXONE <=1 SENSITIVE Sensitive     CIPROFLOXACIN <=0.25 SENSITIVE Sensitive     GENTAMICIN <=1 SENSITIVE Sensitive     IMIPENEM <=0.25 SENSITIVE Sensitive     TRIMETH/SULFA <=20 SENSITIVE Sensitive     CEFOXITIN <=4 SENSITIVE Sensitive     PIP/TAZO Value in next row Resistant      RESISTANT128    * KLEBSIELLA OXYTOCA  Urine culture     Status: None   Collection Time: 11/29/14 11:22 AM  Result Value Ref Range Status   Specimen Description URINE, CLEAN CATCH  Final   Special Requests NONE  Final   Culture >=100,000 COLONIES/mL KLEBSIELLA OXYTOCA  Final   Report Status 12/01/2014 FINAL  Final   Organism ID, Bacteria KLEBSIELLA OXYTOCA  Final      Susceptibility   Klebsiella oxytoca - MIC*    AMPICILLIN >=32 RESISTANT Resistant     CEFAZOLIN >=64 RESISTANT Resistant     CEFTRIAXONE 4 SENSITIVE Sensitive     CIPROFLOXACIN <=0.25 SENSITIVE Sensitive     GENTAMICIN <=1 SENSITIVE Sensitive     IMIPENEM <=0.25 SENSITIVE Sensitive     NITROFURANTOIN <=16 SENSITIVE Sensitive     TRIMETH/SULFA <=20 SENSITIVE Sensitive     CEFOXITIN <=4 SENSITIVE Sensitive     * >=100,000 COLONIES/mL KLEBSIELLA OXYTOCA  MRSA PCR Screening     Status: None   Collection Time: 11/29/14 12:13 PM  Result Value Ref Range Status   MRSA by PCR NEGATIVE NEGATIVE Final    Comment:        The GeneXpert MRSA Assay (FDA approved for NASAL specimens only), is one component of a comprehensive MRSA colonization surveillance program. It is not intended to diagnose  MRSA infection nor to guide or monitor treatment for MRSA infections.   Culture, blood (routine x 2)     Status: None   Collection Time: 11/29/14  1:19 PM  Result Value Ref Range Status   Specimen Description BLOOD  Final   Special Requests Immunocompromised  Final   Culture NO GROWTH 5 DAYS  Final   Report Status 12/04/2014 FINAL  Final  Urine culture     Status: None   Collection Time: 11/30/14 10:12 AM  Result Value Ref Range Status   Specimen Description URINE, CATHETERIZED  Final   Special Requests Immunocompromised  Final   Culture NO GROWTH 2 DAYS  Final   Report Status 12/02/2014 FINAL  Final  Culture, blood (routine x  2)     Status: None (Preliminary result)   Collection Time: 12/06/14  8:08 PM  Result Value Ref Range Status   Specimen Description BLOOD  Final   Special Requests NONE  Final   Culture NO GROWTH < 12 HOURS  Final   Report Status PENDING  Incomplete  Culture, blood (routine x 2)     Status: None (Preliminary result)   Collection Time: 12/06/14  8:40 PM  Result Value Ref Range Status   Specimen Description BLOOD  Final   Special Requests NONE  Final   Culture NO GROWTH < 12 HOURS  Final   Report Status PENDING  Incomplete    Radiology Reports Dg Abd Portable 1v  12/05/2014   CLINICAL DATA:  NG tube placement.  EXAM: PORTABLE ABDOMEN - 1 VIEW  COMPARISON:  None.  FINDINGS: No NG tube is visualized. Left double-J ureteral stent is noted. Contrast material in the distal colon noted.  IMPRESSION: As above.   Electronically Signed   By: Inge Rise M.D.   On: 12/05/2014 16:38     CBC  Recent Labs Lab 12/03/14 1017 12/04/14 0839 12/05/14 0530 12/06/14 0437 12/07/14 0555  WBC 3.7* 2.7* 1.7* 1.0* 0.6*  HGB 7.3* 7.2* 7.5* 7.2* 7.0*  HCT 22.4* 22.2* 23.2* 21.6* 21.4*  PLT 57* 24* 13* 9* 11*  MCV 87.8 87.0 86.8 86.6 86.3  MCH 28.4 28.2 28.1 28.9 28.3  MCHC 32.4 32.5 32.4 33.4 32.8  RDW 19.5* 20.0* 20.1* 20.3* 19.9*  LYMPHSABS  --  1.0  --   --    --   MONOABS  --  0.1*  --   --   --   EOSABS  --  0.1  --   --   --   BASOSABS  --  0.0  --   --   --     Chemistries   Recent Labs Lab 12/03/14 0633  12/03/14 2359 12/04/14 0459 12/04/14 2000 12/05/14 0530 12/05/14 2016 12/06/14 0437 12/07/14 0555  NA 165*  --   --  163*  --  164* 158* 154* 156*  K 3.2*  < >  --  2.8* 3.1* 3.2* 3.7 4.2 4.4  CL >130*  --   --  >130*  --  >130* >130* 130* 125*  CO2 17*  --   --  19*  --  18* 19* 18* 21*  GLUCOSE 175*  --   --  182*  --  150* 146* 208* 253*  BUN 69*  --   --  66*  --  57* 48* 42* 46*  CREATININE 2.58*  --   --  2.45*  --  2.37* 2.13* 2.04* 2.57*  CALCIUM 8.9  --   --  9.0  --  9.1 8.9 8.6* 8.9  MG 2.5*  --   --  2.4  --  2.1  --  1.9 2.3  AST  --   --  20  --   --   --   --  28  --   ALT  --   --  17  --   --   --   --  20  --   ALKPHOS  --   --  141*  --   --   --   --  141*  --   BILITOT  --   --  2.1*  --   --   --   --  2.2*  --   < > =  values in this interval not displayed. ------------------------------------------------------------------------------------------------------------------ estimated creatinine clearance is 27.5 mL/min (by C-G formula based on Cr of 2.57). ------------------------------------------------------------------------------------------------------------------ No results for input(s): HGBA1C in the last 72 hours. ------------------------------------------------------------------------------------------------------------------  Recent Labs  12/06/14 0437  TRIG 262*   ------------------------------------------------------------------------------------------------------------------ No results for input(s): TSH, T4TOTAL, T3FREE, THYROIDAB in the last 72 hours.  Invalid input(s): FREET3 ------------------------------------------------------------------------------------------------------------------ No results for input(s): VITAMINB12, FOLATE, FERRITIN, TIBC, IRON, RETICCTPCT in the last 72  hours.  Coagulation profile No results for input(s): INR, PROTIME in the last 168 hours.  No results for input(s): DDIMER in the last 72 hours.  Cardiac Enzymes No results for input(s): CKMB, TROPONINI, MYOGLOBIN in the last 168 hours.  Invalid input(s): CK ------------------------------------------------------------------------------------------------------------------ Invalid input(s): [place]  This is a 70 year old male with mantle cell lymphoma undergoing chemotherapy who was admitted June 15 from oncology clinic with altered mental status and chest pain.    #1 Sepsis: Patient's urine culture and blood cultures are positive for Klebsiella.   due to his low blood pressure and elevated heart rates on 12/06/2014 he was started on Zosyn and vancomycin. I have obtain a consultation with infectious disease. Dr. Ola Spurr recommends changing Rocephin to ceftaz edema to cover for of CLL as well as neutropenia. We will continue vancomycin until blood cultures have resulted. If these are negative after 72 hours we may discontinue vancomycin.   #2 altered mental status - This is likely due to metabolic encephalopathy due to sepsis and urinary tract infection/ARF/Hypernatremia.  - CT head/MRI brain showing no evidence of acute pathology.  Seen by Neurology and as per them this is likely metabolic encephalopathy too.  - cont. D5W for hypernatremia, cont. IV abx for UTI and follow mental status.  - appreciate neurology consultation  #3 chest pain on admission: No EKG changes to suggest ischemia.  Continue to monitor for any further cardiac symptoms  #4 Klebsiella urinary tract infection plan as above  5 Mantle cell lymphoma: Patient is on salvage chemotherapy. Patient follows with Dr. Mike Gip.    #6. Panctytopenia: Due to chemotherapy and Mantle cell lymphoma, patient is currently on Neupogen. Patient's hemoglobin has decreased this morning. Patient also has low platelets. I  have spoken with hematology. Patient will need blood transfusion and platelet transfusion. Patient needs premedication prior to any transfusions including Benadryl and Tylenol. Oncologist to place orders for transfusion today.    #7. Acute renal failure with hypernatremia this is due to ATN : Appreciate nephrology input. We will continue with D5 as prescribed by nephrology for his hypernatremia.  #8 Hypokalemia - continue supplementation when necessary #9 Thrombocytopenia - Likely due to sepsis and underlying cancer with recent chemotherapy. Patient will need another unit of platelet transfusion. he needs to be premedicated prior to transfusion.   #10. Sinus tachycardia: Possibly related to underlying dehydration/hypernatremia. Patient will continue telemetry monitoring.  11. Ileus: Patient currently has an NG tube placed. We will continue with NG tube and follow-up KUB tomorrow.   12. Hypernatremia: Plan as per nephrology. Sodium did not improve as he was given normal saline yesterday. He is currently on D5 at 175 cc an hour. We will continue to monitor his sodium level.  Prognosis seems to be very poor palliative care consult appreciated patient is DO NOT RESUSCITATE   DNR  DVT Prophylaxis   SCD's   Lab Results  Component Value Date   PLT 11* 12/07/2014     Total Time Spent in minutes - 29 min  plan of care discussed with the family   Greater than 50% of time spent in care and coordination  Jadelin Eng M.D on 12/07/2014 at 12:59 PM  Between 7am to 6pm - Pager - [phone]  After 6pm go to [email or web address] - password [email or web address] [facility]   Office  [phone]

## 2014-12-07 NOTE — Progress Notes (Signed)
[facility]  ROUNDING NOTE   Subjective:  Pt still in CCU. Still lethargic. Hematologic abnormalities noted. Apparently had platelet transfusion reaction yesterday. BP dropped as well. Good UOP noted. Na still high at 156.  Objective:  Vital signs in last 24 hours:  Temp:  [97.5 F (36.4 C)-99.2 F (37.3 C)] 97.5 F (36.4 C) (06/25 0715) Pulse Rate:  [101-152] 117 (06/25 0800) Resp:  [20-51] 21 (06/25 0800) BP: (90-181)/(54-135) 117/71 mmHg (06/25 0800) SpO2:  [99 %-100 %] 100 % (06/25 0800) Weight:  [69.7 kg (153 lb 10.6 oz)] 69.7 kg (153 lb 10.6 oz) (06/24 1404)  Weight change: 0.6 kg (1 lb 5.2 oz) Filed Weights   11/29/14 1222 12/05/14 1400 12/06/14 1404  Weight: 73.483 kg (162 lb) 69.1 kg (152 lb 5.4 oz) 69.7 kg (153 lb 10.6 oz)    Intake/Output: I/O last 3 completed shifts: In: 2949.6 [I.V.:1294.6; Blood:230; NG/GT:1375; IV VPXTGGYIR:48] Out: 5462 [Urine:4750; Emesis/NG output:500]   Intake/Output this shift:     Physical Exam: General: lethargic  Head: Normocephalic, atraumatic. Dry oral mucosal membranes, NG in place  Eyes: Anicteric  Neck: Supple, trachea midline  Lungs:  Clear to auscultation normal effort  Heart: S1S2 tachycardic  Abdomen:  Soft, nontender, slight distension  Extremities: no peripheral edema.  Neurologic: Lethargic but arousable  Skin: No rashes  Access: none    Basic Metabolic Panel:  Recent Labs Lab 12/03/14 0633  12/04/14 0459 12/04/14 2000 12/05/14 0530 12/05/14 2016 12/06/14 0437 12/07/14 0555  NA 165*  --  163*  --  164* 158* 154* 156*  K 3.2*  < > 2.8* 3.1* 3.2* 3.7 4.2 4.4  CL >130*  --  >130*  --  >130* >130* 130* 125*  CO2 17*  --  19*  --  18* 19* 18* 21*  GLUCOSE 175*  --  182*  --  150* 146* 208* 253*  BUN 69*  --  66*  --  57* 48* 42* 46*  CREATININE 2.58*  --  2.45*  --  2.37* 2.13* 2.04* 2.57*  CALCIUM 8.9  --  9.0  --  9.1 8.9 8.6* 8.9  MG 2.5*  --  2.4  --  2.1  --  1.9 2.3  PHOS 4.4  --   3.4  --  2.5  --  2.4* 4.0  < > = values in this interval not displayed.  Liver Function Tests:  Recent Labs Lab 12/03/14 0633 12/03/14 2359 12/06/14 0437  AST  --  20 28  ALT  --  17 20  ALKPHOS  --  141* 141*  BILITOT  --  2.1* 2.2*  PROT  --  5.9* 5.9*  ALBUMIN 2.1* 2.0* 2.2*   No results for input(s): LIPASE, AMYLASE in the last 168 hours.  Recent Labs Lab 12/03/14 2247  AMMONIA 53*    CBC:  Recent Labs Lab 12/03/14 0633 12/04/14 0839 12/05/14 0530 12/06/14 0437 12/07/14 0555  WBC 3.7* 2.7* 1.7* 1.0* 0.6*  NEUTROABS  --  1.6  --   --   --   HGB 7.3* 7.2* 7.5* 7.2* 7.0*  HCT 22.4* 22.2* 23.2* 21.6* 21.4*  MCV 87.8 87.0 86.8 86.6 86.3  PLT 57* 24* 13* 9* 11*    Cardiac Enzymes: No results for input(s): CKTOTAL, CKMB, CKMBINDEX, TROPONINI in the last 168 hours.  BNP: Invalid input(s): POCBNP  CBG:  Recent Labs Lab 12/06/14 1102 12/06/14 1501 12/06/14 1818 12/06/14 2339 12/07/14 0818  GLUCAP 173* 165* 149* 207* 226*  Microbiology: Results for orders placed or performed during the hospital encounter of 11/29/14  MRSA PCR Screening     Status: None   Collection Time: 11/29/14 12:13 PM  Result Value Ref Range Status   MRSA by PCR NEGATIVE NEGATIVE Final    Comment:        The GeneXpert MRSA Assay (FDA approved for NASAL specimens only), is one component of a comprehensive MRSA colonization surveillance program. It is not intended to diagnose MRSA infection nor to guide or monitor treatment for MRSA infections.   Culture, blood (routine x 2)     Status: None   Collection Time: 11/29/14  1:19 PM  Result Value Ref Range Status   Specimen Description BLOOD  Final   Special Requests Immunocompromised  Final   Culture NO GROWTH 5 DAYS  Final   Report Status 12/04/2014 FINAL  Final  Urine culture     Status: None   Collection Time: 11/30/14 10:12 AM  Result Value Ref Range Status   Specimen Description URINE, CATHETERIZED  Final    Special Requests Immunocompromised  Final   Culture NO GROWTH 2 DAYS  Final   Report Status 12/02/2014 FINAL  Final  Culture, blood (routine x 2)     Status: None (Preliminary result)   Collection Time: 12/06/14  8:08 PM  Result Value Ref Range Status   Specimen Description BLOOD  Final   Special Requests NONE  Final   Culture NO GROWTH < 12 HOURS  Final   Report Status PENDING  Incomplete  Culture, blood (routine x 2)     Status: None (Preliminary result)   Collection Time: 12/06/14  8:40 PM  Result Value Ref Range Status   Specimen Description BLOOD  Final   Special Requests NONE  Final   Culture NO GROWTH < 12 HOURS  Final   Report Status PENDING  Incomplete    Coagulation Studies: No results for input(s): LABPROT, INR in the last 72 hours.  Urinalysis: No results for input(s): COLORURINE, LABSPEC, PHURINE, GLUCOSEU, HGBUR, BILIRUBINUR, KETONESUR, PROTEINUR, UROBILINOGEN, NITRITE, LEUKOCYTESUR in the last 72 hours.  Invalid input(s): APPERANCEUR    Imaging: Dg Chest 1 View  12/06/2014   CLINICAL DATA:  Pulmonary congestion.  EXAM: CHEST  1 VIEW  COMPARISON:  12/06/2014  FINDINGS: Nasogastric tube is in place with tip off the film but beyond the gastroesophageal junction. Gaseous distension of a viscus loop partially imaged.  Right-sided Port-A-Cath tip overlies the level of superior vena cava. The heart is enlarged. Suspect small right pleural effusion. Right basilar opacity is stable.  IMPRESSION: 1. Cardiomegaly. 2. Right basilar opacity and probable effusion. 3. Dilated viscus only partially imaged, likely stomach or colon.   Electronically Signed   By: Nolon Nations M.D.   On: 12/06/2014 19:32   Dg Chest 1 View  12/06/2014   CLINICAL DATA:  NG tube placement  EXAM: CHEST  1 VIEW  COMPARISON:  11/29/2014  FINDINGS: Cardiomegaly again noted. Right IJ Port-A-Cath is unchanged in position. NG tube in place with tip in proximal stomach. There is hazy right basilar atelectasis or  infiltrate. No pulmonary edema.  IMPRESSION: NG tube with tip in proximal stomach. Hazy right basilar atelectasis or infiltrate. No pulmonary edema.   Electronically Signed   By: Lahoma Crocker M.D.   On: 12/06/2014 14:40   Dg Abd 1 View  12/07/2014   CLINICAL DATA:  Abdominal distention  EXAM: ABDOMEN - 1 VIEW  COMPARISON:  12/06/2014; 12/05/2014  FINDINGS:  Interval removal of enteric tube.  Worsening severe gas distention of the stomach. Worsening moderate gas distention of the transverse colon with index loop measuring approximately 6.5 cm in diameter.  No supine evidence of pneumoperitoneum. No definite pneumatosis or portal venous gas. Enteric contrast remains within the rectum.  Similar appearance of left-sided double-J ureteral stent.  Limited visualization lower thorax demonstrates enlarged cardiac silhouette and perihilar opacities, likely atelectasis.  No definite acute osseus abnormalities.  IMPRESSION: 1. Interval removal of enteric tube with worsening severe gaseous distention of the stomach, nonspecific though could be seen in the setting of gastric outlet obstruction. 2. Worsening gaseous distention of the colon with residual contrast seen within the rectum nonspecific though could be seen in the setting of ileus.   Electronically Signed   By: Sandi Mariscal M.D.   On: 12/07/2014 08:56   Dg Abd 1 View  12/06/2014   CLINICAL DATA:  Nasogastric tube placement.  EXAM: ABDOMEN - 1 VIEW  COMPARISON:  12/05/2014  FINDINGS: The gastric bubble is distended. The nasogastric tube tip is obscured by motion artifact but thought to be in the vicinity of the gastric cardia. Left renal stent noted. There continues to be some barium in the rectum.  IMPRESSION: 1. Nasogastric tube is indistinct due to motion artifact, but thought to terminate in the gastric cardia. Distended stomach bubble. 2. Left ureteral stent.   Electronically Signed   By: Van Clines M.D.   On: 12/06/2014 14:36   Dg Abd Portable  1v  12/05/2014   CLINICAL DATA:  OG tube placement.  EXAM: PORTABLE ABDOMEN - 1 VIEW  COMPARISON:  Single view of the abdomen earlier this same day.  FINDINGS: OG tube is not visualized with the tip just within the stomach.mm. Recommend advancement of 5 cm. The side port is in the distal esophagus.  IMPRESSION: OG tube tip is in the stomach with the side port in the esophagus. Recommend advancement of 5 cm.   Electronically Signed   By: Inge Rise M.D.   On: 12/05/2014 16:49   Dg Abd Portable 1v  12/05/2014   CLINICAL DATA:  NG tube placement.  EXAM: PORTABLE ABDOMEN - 1 VIEW  COMPARISON:  None.  FINDINGS: No NG tube is visualized. Left double-J ureteral stent is noted. Contrast material in the distal colon noted.  IMPRESSION: As above.   Electronically Signed   By: Inge Rise M.D.   On: 12/05/2014 16:38     Medications:   . dextrose 5 % with KCl 20 mEq / L 20 mEq (12/07/14 0750)  . feeding supplement (JEVITY 1.5 CAL/FIBER) 1,000 mL (12/06/14 1044)  . free water    . norepinephrine 4 mg (12/06/14 2017)   . sodium chloride  250 mL Intravenous Once  . acetaminophen  650 mg Oral Once  . acetaminophen  650 mg Oral Once  . antiseptic oral rinse  7 mL Mouth Rinse BID  . aspirin  324 mg Oral Once  . cefTRIAXone (ROCEPHIN)  IV  1 g Intravenous Q24H  . diphenhydrAMINE  25 mg Intravenous Once  . docusate  50 mg Oral BID  . filgrastim  480 mcg Subcutaneous Once  . filgrastim  480 mcg Subcutaneous Daily  . insulin aspart  1-3 Units Subcutaneous Q6H  . lactulose  30 g Oral BID  . methylPREDNISolone (SOLU-MEDROL) injection  60 mg Intravenous Once  . piperacillin-tazobactam (ZOSYN)  IV  4.5 g Intravenous 3 times per day  . scopolamine  1 patch  Transdermal Q72H  . sennosides  5 mL Oral BID  . sodium chloride  3 mL Intravenous Q12H  . thiamine IV  100 mg Intravenous Daily  . vancomycin  1,000 mg Intravenous Q24H  . ziprasidone  10 mg Intramuscular QHS   acetaminophen **OR**  acetaminophen, haloperidol lactate, heparin lock flush, heparin lock flush, heparin lock flush, heparin lock flush, magnesium hydroxide, metoprolol, morphine injection, sodium chloride, sodium chloride, sodium chloride, sodium chloride, ziprasidone  Assessment/ Plan:  59 AAM with progressive mantle cell lymphoma, hx left sided hydronephrosis and hydroureter. S/p ureteral stent placement    1.  Acute renal failure due to ATN. 2.  Sepsis with klebsiella oxytoca. 3.  Hypernatremia. 4.  Hypokalemia.  Plan:  Na higher today, was apparently on NS for a period of time instead of D5.  Now on D5, would continue at a rate of 175cc/hr.  Pressure dropped yesterday which may explain the rise in Cr.  However as above pt has good UOP at this time.  Pt apparently had transfusion reaction yesterday. Will continue to monitor Na and Cr trend.  Hematologic issues appear to be primary at present.     LOS: Caleb Francis, Caleb Francis 6/25/20169:56 AM

## 2014-12-08 ENCOUNTER — Inpatient Hospital Stay
Admit: 2014-12-08 | Discharge: 2014-12-08 | Disposition: A | Discharge status: Home / Self Care | Payer: Medicare Other | Attending: Infectious Diseases | Admitting: Infectious Diseases

## 2014-12-08 ENCOUNTER — Inpatient Hospital Stay: Payer: Medicare Other

## 2014-12-08 LAB — MAGNESIUM: Magnesium: 2.4 mg/dL (ref 1.7–2.4)

## 2014-12-08 LAB — CBC
HCT: 25.2 % — ABNORMAL LOW (ref 40.0–52.0)
HCT: 23.7 % — ABNORMAL LOW (ref 40.0–52.0)
Hemoglobin: 8.5 g/dL — ABNORMAL LOW (ref 13.0–18.0)
Hemoglobin: 7.9 g/dL — ABNORMAL LOW (ref 13.0–18.0)
MCH: 28.5 pg (ref 26.0–34.0)
MCH: 28.2 pg (ref 26.0–34.0)
MCHC: 33.9 g/dL (ref 32.0–36.0)
MCHC: 33.4 g/dL (ref 32.0–36.0)
MCV: 84.2 fL (ref 80.0–100.0)
MCV: 84.2 fL (ref 80.0–100.0)
Platelets: 34 10*3/uL — ABNORMAL LOW (ref 150–440)
Platelets: 16 10*3/uL — CL (ref 150–440)
RBC: 2.99 MIL/uL — ABNORMAL LOW (ref 4.40–5.90)
RBC: 2.81 MIL/uL — ABNORMAL LOW (ref 4.40–5.90)
RDW: 18.6 % — ABNORMAL HIGH (ref 11.5–14.5)
RDW: 18.1 % — ABNORMAL HIGH (ref 11.5–14.5)
WBC: 0.6 10*3/uL — AB (ref 3.8–10.6)
WBC: 0.9 10*3/uL — CL (ref 3.8–10.6)

## 2014-12-08 LAB — BASIC METABOLIC PANEL
ANION GAP: 9 (ref 5–15)
BUN: 64 mg/dL — ABNORMAL HIGH (ref 6–20)
CO2: 20 mmol/L — ABNORMAL LOW (ref 22–32)
CREATININE: 2.63 mg/dL — AB (ref 0.61–1.24)
Calcium: 8.5 mg/dL — ABNORMAL LOW (ref 8.9–10.3)
Chloride: 126 mmol/L — ABNORMAL HIGH (ref 101–111)
GFR calc non Af Amer: 24 mL/min — ABNORMAL LOW (ref >60)
GFR, EST AFRICAN AMERICAN: 27 mL/min — AB (ref >60)
Glucose, Bld: 184 mg/dL — ABNORMAL HIGH (ref 65–99)
Potassium: 3.9 mmol/L (ref 3.5–5.1)
Sodium: 155 mmol/L — ABNORMAL HIGH (ref 135–145)

## 2014-12-08 LAB — PREPARE PLATELET PHERESIS
UNIT DIVISION: 0
Unit division: 0

## 2014-12-08 LAB — PREPARE RBC (CROSSMATCH)

## 2014-12-08 LAB — GLUCOSE, CAPILLARY
GLUCOSE-CAPILLARY: 152 mg/dL — AB (ref 65–99)
Glucose-Capillary: 149 mg/dL — ABNORMAL HIGH (ref 65–99)
Glucose-Capillary: 153 mg/dL — ABNORMAL HIGH (ref 65–99)
Glucose-Capillary: 179 mg/dL — ABNORMAL HIGH (ref 65–99)
Glucose-Capillary: 198 mg/dL — ABNORMAL HIGH (ref 65–99)
Glucose-Capillary: 201 mg/dL — ABNORMAL HIGH (ref 65–99)

## 2014-12-08 LAB — PHOSPHORUS: Phosphorus: 6 mg/dL — ABNORMAL HIGH (ref 2.5–4.6)

## 2014-12-08 MED ORDER — HEPARIN SOD (PORK) LOCK FLUSH 100 UNIT/ML IV SOLN
250.0000 [IU] | INTRAVENOUS | Status: DC | PRN
  Filled 2014-12-08: qty 3

## 2014-12-08 MED ORDER — DOCUSATE SODIUM 50 MG/5ML PO LIQD
100.0000 mg | Freq: Two times a day (BID) | ORAL | Status: DC
  Administered 2014-12-08 – 2014-12-09 (×2): 100 mg via ORAL
  Filled 2014-12-08 (×2): qty 10

## 2014-12-08 MED ORDER — DIPHENHYDRAMINE HCL 50 MG/ML IJ SOLN
25.0000 mg | Freq: Once | INTRAMUSCULAR | Status: AC
  Administered 2014-12-08: 25 mg via INTRAVENOUS
  Filled 2014-12-08: qty 1

## 2014-12-08 MED ORDER — POTASSIUM CL IN DEXTROSE 5% 20 MEQ/L IV SOLN
20.0000 meq | INTRAVENOUS | Status: DC
  Administered 2014-12-08 – 2014-12-09 (×3): 20 meq via INTRAVENOUS
  Filled 2014-12-08 (×20): qty 1000

## 2014-12-08 MED ORDER — HEPARIN SOD (PORK) LOCK FLUSH 100 UNIT/ML IV SOLN
500.0000 [IU] | Freq: Every day | INTRAVENOUS | Status: DC | PRN
  Filled 2014-12-08: qty 5

## 2014-12-08 MED ORDER — METRONIDAZOLE IN NACL 5-0.79 MG/ML-% IV SOLN
500.0000 mg | Freq: Three times a day (TID) | INTRAVENOUS | Status: DC
  Administered 2014-12-08 – 2014-12-11 (×9): 500 mg via INTRAVENOUS
  Filled 2014-12-08 (×13): qty 100

## 2014-12-08 MED ORDER — SODIUM CHLORIDE 0.9 % IJ SOLN: 10.0000 mL | INTRAMUSCULAR | Status: DC | PRN

## 2014-12-08 MED ORDER — SODIUM CHLORIDE 0.9 % IV SOLN
250.0000 mL | Freq: Once | INTRAVENOUS | Status: AC
  Administered 2014-12-08: 250 mL via INTRAVENOUS

## 2014-12-08 MED ORDER — ACETAMINOPHEN 325 MG PO TABS
650.0000 mg | ORAL_TABLET | Freq: Once | ORAL | Status: AC
  Administered 2014-12-08: 650 mg via ORAL
  Filled 2014-12-08: qty 2

## 2014-12-08 MED ORDER — SENNOSIDES 8.8 MG/5ML PO SYRP
10.0000 mL | ORAL_SOLUTION | Freq: Two times a day (BID) | ORAL | Status: DC
  Administered 2014-12-08 – 2014-12-15 (×13): 10 mL via ORAL
  Filled 2014-12-08 (×13): qty 10

## 2014-12-08 MED ORDER — SODIUM CHLORIDE 0.9 % IJ SOLN: 3.0000 mL | INTRAMUSCULAR | Status: DC | PRN

## 2014-12-08 NOTE — Progress Notes (Signed)
*  PRELIMINARY RESULTS* Echocardiogram 2D Echocardiogram has been performed.  Caleb Francis 12/08/2014, 12:13 PM

## 2014-12-08 NOTE — Progress Notes (Signed)
[place] at [facility]                                                                                                                                                                                            Patient Demographics   Caleb Francis, is a 69 y.o. male, DOB - 1945-12-28, YYQ:[id number]  Admit date - 11/29/2014   Admitting Physician Aldean Jewett, MD  Outpatient Primary MD for the patient is No PCP Per Patient   Subjective: patient awake this am and following commands.  No acute events overnight    Review of Systems:   ROS Patient is not conversive enough to provide ROS   Vitals:   Filed Vitals:   12/08/14 0600 12/08/14 0700 12/08/14 0800 12/08/14 0900  BP: 141/84 145/88 146/89 133/83  Pulse: 116 114 116 114  Temp:   98.4 F (36.9 C)   TempSrc:   Axillary   Resp: 24 42 22 27  Height:      Weight:      SpO2: 100%       Wt Readings from Last 3 Encounters:  12/08/14 64.5 kg (142 lb 3.2 oz)  11/27/14 68.2 kg (150 lb 5.7 oz)  11/25/14 70.761 kg (156 lb)     Intake/Output Summary (Last 24 hours) at 12/08/14 8889 Last data filed at 12/08/14 0900  Gross per 24 hour  Intake   2390 ml  Output   2050 ml  Net    340 ml    Physical Exam:   GENERAL:  Has NGT placed awake this am HEAD, EYES, EARS, NOSE AND THROAT NGT placed PERRLA Atraumatic  NECK: Supple. There is no jugular venous distention. No bruits, no lymphadenopathy, no thyromegaly.  HEART:  tachycaric No murmurs, no rubs, no clicks.  LUNGS:o wheezing rales rhonchii decreased throughout  ABDOMEN:  Improved BS with no rebound/guarding No tenderness EXTREMITIES: No evidence of any cyanosis, clubbing, .  NEUROLOGIC:follows commands and smiles SKIN: Moist and warm with no rashes appreciated.  Psych:    no agitation reported overnight.  Radiology Reports 6/26 KUB: improved COLONIC  ileus     CBC  Recent Labs Lab 12/04/14 0839 12/05/14 0530  12/06/14 0437 12/07/14 0555 12/07/14 1337 12/07/14 2129 12/08/14 0530  WBC 2.7* 1.7* 1.0* 0.6*  --  0.7* 0.6*  HGB 7.2* 7.5* 7.2* 7.0*  --  8.5* 8.5*  HCT 22.2* 23.2* 21.6* 21.4*  --  25.3* 25.2*  PLT 24* 13* 9* 11* 19* 17* 34*  MCV 87.0 86.8 86.6 86.3  --  84.2 84.2  MCH 28.2 28.1 28.9 28.3  --  28.1 28.5  MCHC 32.5 32.4 33.4 32.8  --  33.4 33.9  RDW 20.0* 20.1* 20.3* 19.9*  --  17.2* 18.6*  LYMPHSABS 1.0  --   --   --   --   --   --   MONOABS 0.1*  --   --   --   --   --   --   EOSABS 0.1  --   --   --   --   --   --   BASOSABS 0.0  --   --   --   --   --   --     Chemistries   Recent Labs Lab 12/03/14 2359 12/04/14 0459  12/05/14 0530 12/05/14 2016 12/06/14 0437 12/07/14 0555 12/08/14 0530  NA  --  163*  --  164* 158* 154* 156* 155*  K  --  2.8*  < > 3.2* 3.7 4.2 4.4 3.9  CL  --  >130*  --  >130* >130* 130* 125* 126*  CO2  --  19*  --  18* 19* 18* 21* 20*  GLUCOSE  --  182*  --  150* 146* 208* 253* 184*  BUN  --  66*  --  57* 48* 42* 46* 64*  CREATININE  --  2.45*  --  2.37* 2.13* 2.04* 2.57* 2.63*  CALCIUM  --  9.0  --  9.1 8.9 8.6* 8.9 8.5*  MG  --  2.4  --  2.1  --  1.9 2.3 2.4  AST 20  --   --   --   --  28  --   --   ALT 17  --   --   --   --  20  --   --   ALKPHOS 141*  --   --   --   --  141*  --   --   BILITOT 2.1*  --   --   --   --  2.2*  --   --   < > = values in this interval not displayed. ------------------------------------------------------------------------------------------------------------------ estimated creatinine clearance is 24.9 mL/min (by C-G formula based on Cr of 2.63). ------------------------------------------------------------------------------------------------------------------ No results for input(s): HGBA1C in the last 72 hours. ------------------------------------------------------------------------------------------------------------------  Recent Labs  12/06/14 0437  TRIG 262*    ------------------------------------------------------------------------------------------------------------------ No results for input(s): TSH, T4TOTAL, T3FREE, THYROIDAB in the last 72 hours.  Invalid input(s): FREET3 ------------------------------------------------------------------------------------------------------------------ No results for input(s): VITAMINB12, FOLATE, FERRITIN, TIBC, IRON, RETICCTPCT in the last 72 hours.  Coagulation profile  Recent Labs Lab 12/07/14 2129  INR 1.28    No results for input(s): DDIMER in the last 72 hours.  Cardiac Enzymes No results for input(s): CKMB, TROPONINI, MYOGLOBIN in the last 168 hours.  Invalid input(s): CK ------------------------------------------------------------------------------------------------------------------ Invalid input(s): Vivian  This is a 69 year old male with mantle cell lymphoma undergoing chemotherapy who was admitted June 15 from oncology clinic with altered mental status and chest pain.    #1 Sepsis: Patient's urine culture and blood cultures were positive for Klebsiella on admission.   Due to his low blood pressure and elevated heart rates on 12/06/2014 he was started on Zosyn and vancomycin.  Dr. Ola Spurr recommends changing Rocephin to ceftaz  to cover for KLEBSIELLA as well as neutropenia.  Repeat blood cx 1/2 GPC. I will continue with [facility] until results are finalized.FLAGLY also added for anaerobic coverage until final blood cx reported.  He was temporarily placed on pressors on 6/24 with  the transfusion reaction  but has now been off of this and BP stable. He remins CRITICALLY ILL   #2 altered mental status - This is due to metabolic encephalopathy due to sepsis and urinary tract infection/ARF/Hypernatremia along with now IC delirium. - CT head/MRI brain showing no evidence of acute pathology.  - cont. D5W for hypernatremia, cont. IV abx for UTI/bacteremia and follow mental  status.  - appreciate neurology consultation - MS improved this am   #3 chest pain on admission: No EKG changes to suggest ischemia.  Continue to monitor for any further cardiac symptoms  #4 Klebsiella urinary tract infection with bacteremia: He is now on CEFTAZADIME for broader coverage after hypotension and tachycardia episode on 6/24.  5 Mantle cell lymphoma: Patient is on salvage chemotherapy. Patient follows with Dr. Mike Gip.   He is s/p chemo day #11.  He is on GCSF adn remains neutrapenic.  #6. Panctytopenia: Due to chemotherapy and Mantle cell lymphoma, patient is currently on Neupogen. Patient is s/p 2 units PRBC on 6/25 and has received PLT transfusion psat several days. Patient needs premedication prior to any transfusions including Benadryl and Tylenol.    #7. Acute renal failure due to ATN : Appreciate nephrology input. We will continue with D5 as prescribed by nephrology for his hypernatremia. #8 Hypokalemia - continue supplementation when necessary #9 Thrombocytopenia This is due to sepsis. Patient will need to have platelets greater than 50,000 due to his active GI bleed. Platelet count has improved.  #10. Sinus tachycardia: Possibly related to underlying dehydration/hypernatremia. Patient will continue telemetry monitoring.   12 colonic Ileus: Patient currently has an NG tube placed. KUB this morning shows improved appearance of colonic ileus. NO RECTAL MEDS DUE TO NEUTRAPENIA  12. Hypernatremia: Remains elevated. Plan per Nephrololgy. 13. GI bleed: Patient currently has an NG tube placed. We will continue PPI and CBC monitoring. At this time we will continue supportive therapy due to low platelets GI will be unable to do any invasive procedure.  Prognosis is very poor palliative care consult appreciated patient is DO NOT RESUSCITATE   DNR  DVT Prophylaxis   SCD's   Lab Results  Component Value Date   PLT 34* 12/08/2014     Total Time Spent in minutes -  30 min   plan of care discussed with the family and Dr Ola Spurr and Dr. Mike Gip  Greater than 50% of time spent in care and coordination  Jacole Capley M.D on 12/08/2014 at 9:38 AM  Between 7am to 6pm - Pager - [phone]  After 6pm go to [email or web address] - password [email or web address] [facility]   Office  [phone]

## 2014-12-08 NOTE — Progress Notes (Signed)
[facility] NOTE  Pharmacy Consult for Electrolyte Management and Constipation Management Indication: ICU Status   No Known Allergies  Patient Measurements: Height: 6' (182.9 cm) Weight: 142 lb 3.2 oz (64.5 kg) IBW/kg (Calculated) : 77.6   Vital Signs: Temp: 98.2 F (36.8 C) (06/26 1200) Temp Source: Axillary (06/26 1200) BP: 134/86 mmHg (06/26 1200) Pulse Rate: 110 (06/26 1200) Intake/Output from previous day: 06/25 0701 - 06/26 0700 In: 2265 [I.V.:900; Blood:1115; IV Piggyback:250] Out: 2450 [Urine:1950; Emesis/NG output:500] Intake/Output from this shift: Total I/O In: 1000 [I.V.:700; IV Piggyback:300] Out: 750 [Urine:750] Vent settings for last 24 hours:    Labs:  Recent Labs  12/06/14 0437 12/07/14 0555 12/07/14 1337 12/07/14 2129 12/08/14 0530  WBC 1.0* 0.6*  --  0.7* 0.6*  HGB 7.2* 7.0*  --  8.5* 8.5*  HCT 21.6* 21.4*  --  25.3* 25.2*  PLT 9* 11* 19* 17* 34*  APTT  --   --   --  29  --   INR  --   --   --  1.28  --   CREATININE 2.04* 2.57*  --   --  2.63*  MG 1.9 2.3  --   --  2.4  PHOS 2.4* 4.0  --   --  6.0*  ALBUMIN 2.2*  --   --   --   --   PROT 5.9*  --   --   --   --   AST 28  --   --   --   --   ALT 20  --   --   --   --   ALKPHOS 141*  --   --   --   --   BILITOT 2.2*  --   --   --   --    Estimated Creatinine Clearance: 24.9 mL/min (by C-G formula based on Cr of 2.63). BMP Latest Ref Rng 12/08/2014 12/07/2014 12/06/2014  Glucose 65 - 99 mg/dL 184(H) 253(H) 208(H)  BUN 6 - 20 mg/dL 64(H) 46(H) 42(H)  Creatinine 0.61 - 1.24 mg/dL 2.63(H) 2.57(H) 2.04(H)  Sodium 135 - 145 mmol/L 155(H) 156(H) 154(H)  Potassium 3.5 - 5.1 mmol/L 3.9 4.4 4.2  Chloride 101 - 111 mmol/L 126(H) 125(H) 130(H)  CO2 22 - 32 mmol/L 20(L) 21(L) 18(L)  Calcium 8.9 - 10.3 mg/dL 8.5(L) 8.9 8.6(L)     Recent Labs  12/08/14 0015 12/08/14 0540 12/08/14 1109  GLUCAP 152* 179* 201*    Microbiology: Recent Results (from the past 720 hour(s))   Blood Culture (routine x 2)     Status: None   Collection Time: 11/08/14 10:21 PM  Result Value Ref Range Status   Specimen Description BLOOD  Final   Special Requests NONE  Final   Culture NO GROWTH 5 DAYS  Final   Report Status 11/13/2014 FINAL  Final  Blood Culture (routine x 2)     Status: None   Collection Time: 11/08/14 10:22 PM  Result Value Ref Range Status   Specimen Description BLOOD  Final   Special Requests NONE  Final   Culture NO GROWTH 5 DAYS  Final   Report Status 11/13/2014 FINAL  Final  Urine culture     Status: None   Collection Time: 11/09/14  2:30 AM  Result Value Ref Range Status   Specimen Description URINE, RANDOM  Final   Special Requests NONE  Final   Culture NO GROWTH 1 DAY  Final   Report Status 11/10/2014  FINAL  Final  Culture, blood (single)     Status: None   Collection Time: 11/29/14 11:09 AM  Result Value Ref Range Status   Specimen Description BLOOD  Final   Special Requests NONE  Final   Culture  Setup Time   Final    GRAM NEGATIVE RODS AEROBIC BOTTLE ONLY CRITICAL RESULT CALLED TO, READ BACK BY AND VERIFIED WITH: SANDRA VORBA ON 11/30/14 AT 0825 BY JEF    Culture KLEBSIELLA OXYTOCA AEROBIC BOTTLE ONLY   Final   Report Status 12/04/2014 FINAL  Final   Organism ID, Bacteria KLEBSIELLA OXYTOCA  Final      Susceptibility   Klebsiella oxytoca - MIC*    AMPICILLIN >=32 RESISTANT Resistant     CEFTAZIDIME <=1 SENSITIVE Sensitive     CEFAZOLIN >=64 RESISTANT Resistant     CEFTRIAXONE <=1 SENSITIVE Sensitive     CIPROFLOXACIN <=0.25 SENSITIVE Sensitive     GENTAMICIN <=1 SENSITIVE Sensitive     IMIPENEM <=0.25 SENSITIVE Sensitive     TRIMETH/SULFA <=20 SENSITIVE Sensitive     CEFOXITIN <=4 SENSITIVE Sensitive     PIP/TAZO Value in next row Resistant      RESISTANT128    * KLEBSIELLA OXYTOCA  Urine culture     Status: None   Collection Time: 11/29/14 11:22 AM  Result Value Ref Range Status   Specimen Description URINE, CLEAN CATCH  Final    Special Requests NONE  Final   Culture >=100,000 COLONIES/mL KLEBSIELLA OXYTOCA  Final   Report Status 12/01/2014 FINAL  Final   Organism ID, Bacteria KLEBSIELLA OXYTOCA  Final      Susceptibility   Klebsiella oxytoca - MIC*    AMPICILLIN >=32 RESISTANT Resistant     CEFAZOLIN >=64 RESISTANT Resistant     CEFTRIAXONE 4 SENSITIVE Sensitive     CIPROFLOXACIN <=0.25 SENSITIVE Sensitive     GENTAMICIN <=1 SENSITIVE Sensitive     IMIPENEM <=0.25 SENSITIVE Sensitive     NITROFURANTOIN <=16 SENSITIVE Sensitive     TRIMETH/SULFA <=20 SENSITIVE Sensitive     CEFOXITIN <=4 SENSITIVE Sensitive     * >=100,000 COLONIES/mL KLEBSIELLA OXYTOCA  MRSA PCR Screening     Status: None   Collection Time: 11/29/14 12:13 PM  Result Value Ref Range Status   MRSA by PCR NEGATIVE NEGATIVE Final    Comment:        The GeneXpert MRSA Assay (FDA approved for NASAL specimens only), is one component of a comprehensive MRSA colonization surveillance program. It is not intended to diagnose MRSA infection nor to guide or monitor treatment for MRSA infections.   Culture, blood (routine x 2)     Status: None   Collection Time: 11/29/14  1:19 PM  Result Value Ref Range Status   Specimen Description BLOOD  Final   Special Requests Immunocompromised  Final   Culture NO GROWTH 5 DAYS  Final   Report Status 12/04/2014 FINAL  Final  Urine culture     Status: None   Collection Time: 11/30/14 10:12 AM  Result Value Ref Range Status   Specimen Description URINE, CATHETERIZED  Final   Special Requests Immunocompromised  Final   Culture NO GROWTH 2 DAYS  Final   Report Status 12/02/2014 FINAL  Final  Culture, blood (routine x 2)     Status: None (Preliminary result)   Collection Time: 12/06/14  8:08 PM  Result Value Ref Range Status   Specimen Description BLOOD  Final   Special Requests NONE  Final  Culture  Setup Time   Final    GRAM POSITIVE COCCI ANAEROBIC BOTTLE ONLY CRITICAL RESULT CALLED TO, READ  BACK BY AND VERIFIED WITH: JENNIFER BEZARD AT 5374 12/08/14.PMH CONFIRMED BY RWW    Culture   Final    GRAM POSITIVE COCCI ANAEROBIC BOTTLE ONLY IDENTIFICATION TO FOLLOW    Report Status PENDING  Incomplete  Culture, blood (routine x 2)     Status: None (Preliminary result)   Collection Time: 12/06/14  8:40 PM  Result Value Ref Range Status   Specimen Description BLOOD  Final   Special Requests NONE  Final   Culture NO GROWTH 2 DAYS  Final   Report Status PENDING  Incomplete    Medications:  Scheduled:  . acetaminophen  650 mg Oral Once  . antiseptic oral rinse  7 mL Mouth Rinse BID  . aspirin  324 mg Oral Once  . cefTAZidime (FORTAZ)  IV  2 g Intravenous Q24H  . diphenhydrAMINE  25 mg Intravenous Once  . docusate  100 mg Oral BID  . filgrastim  480 mcg Subcutaneous Once  . filgrastim  480 mcg Subcutaneous Daily  . insulin aspart  1-3 Units Subcutaneous Q6H  . lactulose  30 g Oral BID  . methylPREDNISolone (SOLU-MEDROL) injection  60 mg Intravenous Once  . metronidazole  500 mg Intravenous Q8H  . scopolamine  1 patch Transdermal Q72H  . sennosides  10 mL Oral BID  . sodium chloride  3 mL Intravenous Q12H  . thiamine IV  100 mg Intravenous Daily  . Vancomycin  750 mg Intravenous Q24H  . ziprasidone  10 mg Intramuscular QHS   Infusions:  . dextrose 5 % with KCl 20 mEq / L 20 mEq (12/08/14 1203)  . feeding supplement (JEVITY 1.5 CAL/FIBER) 1,000 mL (12/06/14 1044)  . free water    . norepinephrine 4 mg (12/06/14 2017)    Assessment: 69 yo male with progressive mantle cell lymphoma. Patient being treated for electrolyte imbalance and AMS.  Goal of Therapy:  Resolution of Symptoms  Plan:   1. Electrolytes: Potassium has normalized. Phos= 6.0. Patient now receiving D5W with 77mEq of potassium/Liter at 280mL/hr. Will obtain follow-up labs in am.    2. Constipation: No BM charted. Patient currently ordered lactulose 75mL BID by neuro. Will increaswe docusate to 128mL VT  BID and senna to 17.6mg  VT BID. (holding TF d/t ?ileus, if unable to restart consider TPN per Dietician)  3. Delirium: Per neuro patient's delirium has worsened over the day. Please not that diazepam 5mg  IV Q4hr was discontinued after family refused administration prior to 1000 rounds.   Pharmacy will continue to monitor and adjust per consult.  Chinita Greenland PharmD Clinical Pharmacist 12/08/2014

## 2014-12-08 NOTE — Progress Notes (Signed)
ANTIBIOTIC CONSULT NOTE - Follow up  Pharmacy Consult for ceftazidime/Vancomycin Indication: Febrile neutropenia  No Known Allergies  Patient Measurements: Height: 6' (182.9 cm) Weight: 142 lb 3.2 oz (64.5 kg) IBW/kg (Calculated) : 77.6   Vital Signs: Temp: 98.2 F (36.8 C) (06/26 1200) Temp Source: Axillary (06/26 1200) BP: 134/86 mmHg (06/26 1200) Pulse Rate: 110 (06/26 1200) Intake/Output from previous day: 06/25 0701 - 06/26 0700 In: 2265 [I.V.:900; Blood:1115; IV Piggyback:250] Out: 2450 [PTWSF:6812; Emesis/NG output:500] Intake/Output from this shift: Total I/O In: 1000 [I.V.:700; IV Piggyback:300] Out: 750 [Urine:750]  Labs:  Recent Labs  12/06/14 0437 12/07/14 0555 12/07/14 1337 12/07/14 2129 12/08/14 0530  WBC 1.0* 0.6*  --  0.7* 0.6*  HGB 7.2* 7.0*  --  8.5* 8.5*  PLT 9* 11* 19* 17* 34*  CREATININE 2.04* 2.57*  --   --  2.63*   Estimated Creatinine Clearance: 24.9 mL/min (by C-G formula based on Cr of 2.63).   Microbiology: Recent Results (from the past 720 hour(s))  Blood Culture (routine x 2)     Status: None   Collection Time: 11/08/14 10:21 PM  Result Value Ref Range Status   Specimen Description BLOOD  Final   Special Requests NONE  Final   Culture NO GROWTH 5 DAYS  Final   Report Status 11/13/2014 FINAL  Final  Blood Culture (routine x 2)     Status: None   Collection Time: 11/08/14 10:22 PM  Result Value Ref Range Status   Specimen Description BLOOD  Final   Special Requests NONE  Final   Culture NO GROWTH 5 DAYS  Final   Report Status 11/13/2014 FINAL  Final  Urine culture     Status: None   Collection Time: 11/09/14  2:30 AM  Result Value Ref Range Status   Specimen Description URINE, RANDOM  Final   Special Requests NONE  Final   Culture NO GROWTH 1 DAY  Final   Report Status 11/10/2014 FINAL  Final  Culture, blood (single)     Status: None   Collection Time: 11/29/14 11:09 AM  Result Value Ref Range Status   Specimen  Description BLOOD  Final   Special Requests NONE  Final   Culture  Setup Time   Final    GRAM NEGATIVE RODS AEROBIC BOTTLE ONLY CRITICAL RESULT CALLED TO, READ BACK BY AND VERIFIED WITH: SANDRA VORBA ON 11/30/14 AT 0825 BY JEF    Culture KLEBSIELLA OXYTOCA AEROBIC BOTTLE ONLY   Final   Report Status 12/04/2014 FINAL  Final   Organism ID, Bacteria KLEBSIELLA OXYTOCA  Final      Susceptibility   Klebsiella oxytoca - MIC*    AMPICILLIN >=32 RESISTANT Resistant     CEFTAZIDIME <=1 SENSITIVE Sensitive     CEFAZOLIN >=64 RESISTANT Resistant     CEFTRIAXONE <=1 SENSITIVE Sensitive     CIPROFLOXACIN <=0.25 SENSITIVE Sensitive     GENTAMICIN <=1 SENSITIVE Sensitive     IMIPENEM <=0.25 SENSITIVE Sensitive     TRIMETH/SULFA <=20 SENSITIVE Sensitive     CEFOXITIN <=4 SENSITIVE Sensitive     PIP/TAZO Value in next row Resistant      RESISTANT128    * KLEBSIELLA OXYTOCA  Urine culture     Status: None   Collection Time: 11/29/14 11:22 AM  Result Value Ref Range Status   Specimen Description URINE, CLEAN CATCH  Final   Special Requests NONE  Final   Culture >=100,000 COLONIES/mL KLEBSIELLA OXYTOCA  Final   Report Status 12/01/2014  FINAL  Final   Organism ID, Bacteria KLEBSIELLA OXYTOCA  Final      Susceptibility   Klebsiella oxytoca - MIC*    AMPICILLIN >=32 RESISTANT Resistant     CEFAZOLIN >=64 RESISTANT Resistant     CEFTRIAXONE 4 SENSITIVE Sensitive     CIPROFLOXACIN <=0.25 SENSITIVE Sensitive     GENTAMICIN <=1 SENSITIVE Sensitive     IMIPENEM <=0.25 SENSITIVE Sensitive     NITROFURANTOIN <=16 SENSITIVE Sensitive     TRIMETH/SULFA <=20 SENSITIVE Sensitive     CEFOXITIN <=4 SENSITIVE Sensitive     * >=100,000 COLONIES/mL KLEBSIELLA OXYTOCA  MRSA PCR Screening     Status: None   Collection Time: 11/29/14 12:13 PM  Result Value Ref Range Status   MRSA by PCR NEGATIVE NEGATIVE Final    Comment:        The GeneXpert MRSA Assay (FDA approved for NASAL specimens only), is one  component of a comprehensive MRSA colonization surveillance program. It is not intended to diagnose MRSA infection nor to guide or monitor treatment for MRSA infections.   Culture, blood (routine x 2)     Status: None   Collection Time: 11/29/14  1:19 PM  Result Value Ref Range Status   Specimen Description BLOOD  Final   Special Requests Immunocompromised  Final   Culture NO GROWTH 5 DAYS  Final   Report Status 12/04/2014 FINAL  Final  Urine culture     Status: None   Collection Time: 11/30/14 10:12 AM  Result Value Ref Range Status   Specimen Description URINE, CATHETERIZED  Final   Special Requests Immunocompromised  Final   Culture NO GROWTH 2 DAYS  Final   Report Status 12/02/2014 FINAL  Final  Culture, blood (routine x 2)     Status: None (Preliminary result)   Collection Time: 12/06/14  8:08 PM  Result Value Ref Range Status   Specimen Description BLOOD  Final   Special Requests NONE  Final   Culture  Setup Time   Final    GRAM POSITIVE COCCI ANAEROBIC BOTTLE ONLY CRITICAL RESULT CALLED TO, READ BACK BY AND VERIFIED WITH: JENNIFER BEZARD AT 3557 12/08/14.PMH CONFIRMED BY RWW    Culture   Final    GRAM POSITIVE COCCI ANAEROBIC BOTTLE ONLY IDENTIFICATION TO FOLLOW    Report Status PENDING  Incomplete  Culture, blood (routine x 2)     Status: None (Preliminary result)   Collection Time: 12/06/14  8:40 PM  Result Value Ref Range Status   Specimen Description BLOOD  Final   Special Requests NONE  Final   Culture NO GROWTH 2 DAYS  Final   Report Status PENDING  Incomplete    Medical History: Past Medical History  Diagnosis Date  . Arthritis   . Hypertension   . RA (rheumatoid arthritis)   . Anemia   . Cancer     lymphoma  . History of nuclear stress test     a. 12/2013: low risk, no sig ischemia, no EKG changes, no artifact, EF 63%  . Chronic kidney disease      Assessment: 69 yo male with mantle cell lymphoma now on ceftazidime/vancomycin for coverage  of neutropenia per ID note. (Metronidazole added by ID 6/26)  Goal of Therapy:  Resolution of infection   Plan:  Blood cx GPC Patient ordered ceftazidime 2 g IV q24h. Patient ordered Vancomycin 1 gram IV Q24h, started 6/24. Scr increased. Will transition to Vancomycin 750mg  Q24h on 6/26. Vancomycin trough ordered for  6/27 at 0830 (not at steady state). Follow renal fxn.   Pharmacy will continue to follow  Chinita Greenland PharmD Clinical Pharmacist 12/08/2014

## 2014-12-08 NOTE — Progress Notes (Signed)
I went to blood bank for platelets and was told platelets will not be available for 45 minutes. I informed Dr. Mike Gip of the delay as she wants platelets given before blood.

## 2014-12-08 NOTE — Progress Notes (Signed)
[facility]  ROUNDING NOTE   Subjective:  Seems a bit more awake today. Cr 2.63. Good UOP noted.    Objective:  Vital signs in last 24 hours:  Temp:  [97.4 F (36.3 C)-98.4 F (36.9 C)] 98.4 F (36.9 C) (06/26 0800) Pulse Rate:  [107-123] 107 (06/26 1100) Resp:  [19-48] 27 (06/26 1100) BP: (117-147)/(70-91) 140/89 mmHg (06/26 1100) SpO2:  [100 %] 100 % (06/26 0600) Weight:  [64.5 kg (142 lb 3.2 oz)] 64.5 kg (142 lb 3.2 oz) (06/26 0500)  Weight change: -5.2 kg (-11 lb 7.4 oz) Filed Weights   12/05/14 1400 12/06/14 1404 12/08/14 0500  Weight: 69.1 kg (152 lb 5.4 oz) 69.7 kg (153 lb 10.6 oz) 64.5 kg (142 lb 3.2 oz)    Intake/Output: I/O last 3 completed shifts: In: 2640 [I.V.:900; Blood:1315; NG/GT:175; IV Piggyback:250] Out: 7829 [Urine:2650; Emesis/NG output:1000]   Intake/Output this shift:  Total I/O In: 675 [I.V.:525; IV Piggyback:150] Out: -   Physical Exam: General: Awake, ill appearing  Head: Normocephalic, atraumatic. Dry oral mucosal membranes, NG in place  Eyes: Anicteric  Neck: Supple, trachea midline  Lungs:  Clear to auscultation normal effort  Heart: S1S2 tachycardic  Abdomen:  Soft, nontender, slight distension  Extremities: no peripheral edema  Neurologic: Awake, able to verbalize at times, not following commands consistently  Skin: No rashes  Access: none    Basic Metabolic Panel:  Recent Labs Lab 12/04/14 0459  12/05/14 0530 12/05/14 2016 12/06/14 0437 12/07/14 0555 12/08/14 0530  NA 163*  --  164* 158* 154* 156* 155*  K 2.8*  < > 3.2* 3.7 4.2 4.4 3.9  CL >130*  --  >130* >130* 130* 125* 126*  CO2 19*  --  18* 19* 18* 21* 20*  GLUCOSE 182*  --  150* 146* 208* 253* 184*  BUN 66*  --  57* 48* 42* 46* 64*  CREATININE 2.45*  --  2.37* 2.13* 2.04* 2.57* 2.63*  CALCIUM 9.0  --  9.1 8.9 8.6* 8.9 8.5*  MG 2.4  --  2.1  --  1.9 2.3 2.4  PHOS 3.4  --  2.5  --  2.4* 4.0 6.0*  < > = values in this interval not  displayed.  Liver Function Tests:  Recent Labs Lab 12/03/14 0633 12/03/14 2359 12/06/14 0437  AST  --  20 28  ALT  --  17 20  ALKPHOS  --  141* 141*  BILITOT  --  2.1* 2.2*  PROT  --  5.9* 5.9*  ALBUMIN 2.1* 2.0* 2.2*   No results for input(s): LIPASE, AMYLASE in the last 168 hours.  Recent Labs Lab 12/03/14 2247  AMMONIA 53*    CBC:  Recent Labs Lab 12/04/14 0839 12/05/14 0530 12/06/14 0437 12/07/14 0555 12/07/14 1337 12/07/14 2129 12/08/14 0530  WBC 2.7* 1.7* 1.0* 0.6*  --  0.7* 0.6*  NEUTROABS 1.6  --   --   --   --   --   --   HGB 7.2* 7.5* 7.2* 7.0*  --  8.5* 8.5*  HCT 22.2* 23.2* 21.6* 21.4*  --  25.3* 25.2*  MCV 87.0 86.8 86.6 86.3  --  84.2 84.2  PLT 24* 13* 9* 11* 19* 17* 34*    Cardiac Enzymes: No results for input(s): CKTOTAL, CKMB, CKMBINDEX, TROPONINI in the last 168 hours.  BNP: Invalid input(s): POCBNP  CBG:  Recent Labs Lab 12/07/14 1328 12/07/14 1821 12/08/14 0015 12/08/14 0540 12/08/14 1109  GLUCAP 228* 125* 152*  81* 201*    Microbiology: Results for orders placed or performed during the hospital encounter of 11/29/14  MRSA PCR Screening     Status: None   Collection Time: 11/29/14 12:13 PM  Result Value Ref Range Status   MRSA by PCR NEGATIVE NEGATIVE Final    Comment:        The GeneXpert MRSA Assay (FDA approved for NASAL specimens only), is one component of a comprehensive MRSA colonization surveillance program. It is not intended to diagnose MRSA infection nor to guide or monitor treatment for MRSA infections.   Culture, blood (routine x 2)     Status: None   Collection Time: 11/29/14  1:19 PM  Result Value Ref Range Status   Specimen Description BLOOD  Final   Special Requests Immunocompromised  Final   Culture NO GROWTH 5 DAYS  Final   Report Status 12/04/2014 FINAL  Final  Urine culture     Status: None   Collection Time: 11/30/14 10:12 AM  Result Value Ref Range Status   Specimen Description URINE,  CATHETERIZED  Final   Special Requests Immunocompromised  Final   Culture NO GROWTH 2 DAYS  Final   Report Status 12/02/2014 FINAL  Final  Culture, blood (routine x 2)     Status: None (Preliminary result)   Collection Time: 12/06/14  8:08 PM  Result Value Ref Range Status   Specimen Description BLOOD  Final   Special Requests NONE  Final   Culture  Setup Time   Final    GRAM POSITIVE COCCI ANAEROBIC BOTTLE ONLY CRITICAL RESULT CALLED TO, READ BACK BY AND VERIFIED WITH: JENNIFER BEZARD AT 4431 12/08/14.PMH CONFIRMED BY [facility]    Culture   Final    GRAM POSITIVE COCCI ANAEROBIC BOTTLE ONLY IDENTIFICATION TO FOLLOW    Report Status PENDING  Incomplete  Culture, blood (routine x 2)     Status: None (Preliminary result)   Collection Time: 12/06/14  8:40 PM  Result Value Ref Range Status   Specimen Description BLOOD  Final   Special Requests NONE  Final   Culture NO GROWTH 2 DAYS  Final   Report Status PENDING  Incomplete    Coagulation Studies:  Recent Labs  12/07/14 2129  LABPROT 16.2*  INR 1.28    Urinalysis: No results for input(s): COLORURINE, LABSPEC, PHURINE, GLUCOSEU, HGBUR, BILIRUBINUR, KETONESUR, PROTEINUR, UROBILINOGEN, NITRITE, LEUKOCYTESUR in the last 72 hours.  Invalid input(s): APPERANCEUR    Imaging: Dg Chest 1 View  12/06/2014   CLINICAL DATA:  Pulmonary congestion.  EXAM: CHEST  1 VIEW  COMPARISON:  12/06/2014  FINDINGS: Nasogastric tube is in place with tip off the film but beyond the gastroesophageal junction. Gaseous distension of a viscus loop partially imaged.  Right-sided Port-A-Cath tip overlies the level of superior vena cava. The heart is enlarged. Suspect small right pleural effusion. Right basilar opacity is stable.  IMPRESSION: 1. Cardiomegaly. 2. Right basilar opacity and probable effusion. 3. Dilated viscus only partially imaged, likely stomach or colon.   Electronically Signed   By: Nolon Nations M.D.   On: 12/06/2014 19:32   Dg Chest 1  View  12/06/2014   CLINICAL DATA:  NG tube placement  EXAM: CHEST  1 VIEW  COMPARISON:  11/29/2014  FINDINGS: Cardiomegaly again noted. Right IJ Port-A-Cath is unchanged in position. NG tube in place with tip in proximal stomach. There is hazy right basilar atelectasis or infiltrate. No pulmonary edema.  IMPRESSION: NG tube with tip in proximal stomach.  Hazy right basilar atelectasis or infiltrate. No pulmonary edema.   Electronically Signed   By: Lahoma Crocker M.D.   On: 12/06/2014 14:40   Dg Abd 1 View  12/07/2014   CLINICAL DATA:  Abdominal distention  EXAM: ABDOMEN - 1 VIEW  COMPARISON:  12/06/2014; 12/05/2014  FINDINGS: Interval removal of enteric tube.  Worsening severe gas distention of the stomach. Worsening moderate gas distention of the transverse colon with index loop measuring approximately 6.5 cm in diameter.  No supine evidence of pneumoperitoneum. No definite pneumatosis or portal venous gas. Enteric contrast remains within the rectum.  Similar appearance of left-sided double-J ureteral stent.  Limited visualization lower thorax demonstrates enlarged cardiac silhouette and perihilar opacities, likely atelectasis.  No definite acute osseus abnormalities.  IMPRESSION: 1. Interval removal of enteric tube with worsening severe gaseous distention of the stomach, nonspecific though could be seen in the setting of gastric outlet obstruction. 2. Worsening gaseous distention of the colon with residual contrast seen within the rectum nonspecific though could be seen in the setting of ileus.   Electronically Signed   By: Sandi Mariscal M.D.   On: 12/07/2014 08:56   Dg Abd 1 View  12/06/2014   CLINICAL DATA:  Nasogastric tube placement.  EXAM: ABDOMEN - 1 VIEW  COMPARISON:  12/05/2014  FINDINGS: The gastric bubble is distended. The nasogastric tube tip is obscured by motion artifact but thought to be in the vicinity of the gastric cardia. Left renal stent noted. There continues to be some barium in the rectum.   IMPRESSION: 1. Nasogastric tube is indistinct due to motion artifact, but thought to terminate in the gastric cardia. Distended stomach bubble. 2. Left ureteral stent.   Electronically Signed   By: Van Clines M.D.   On: 12/06/2014 14:36   Dg Abd Portable 1v  12/08/2014   CLINICAL DATA:  Ileus.  EXAM: PORTABLE ABDOMEN - 1 VIEW  COMPARISON:  12/07/2014  FINDINGS: There is a left-sided nephro ureteral stent in place. Persistent enteric contrast material within the rectum. Colonic ileus is stable to improved from the previous exam.  IMPRESSION: 1. Stable to improved appearance of colonic ileus.   Electronically Signed   By: Kerby Moors M.D.   On: 12/08/2014 08:58     Medications:   . dextrose 5 % with KCl 20 mEq / L Stopped (12/07/14 1500)  . feeding supplement (JEVITY 1.5 CAL/FIBER) 1,000 mL (12/06/14 1044)  . free water    . norepinephrine 4 mg (12/06/14 2017)   . acetaminophen  650 mg Oral Once  . antiseptic oral rinse  7 mL Mouth Rinse BID  . aspirin  324 mg Oral Once  . cefTAZidime (FORTAZ)  IV  2 g Intravenous Q24H  . diphenhydrAMINE  25 mg Intravenous Once  . docusate  50 mg Oral BID  . filgrastim  480 mcg Subcutaneous Once  . filgrastim  480 mcg Subcutaneous Daily  . insulin aspart  1-3 Units Subcutaneous Q6H  . lactulose  30 g Oral BID  . methylPREDNISolone (SOLU-MEDROL) injection  60 mg Intravenous Once  . metronidazole  500 mg Intravenous Q8H  . scopolamine  1 patch Transdermal Q72H  . sennosides  5 mL Oral BID  . sodium chloride  3 mL Intravenous Q12H  . thiamine IV  100 mg Intravenous Daily  . Vancomycin  750 mg Intravenous Q24H  . ziprasidone  10 mg Intramuscular QHS   acetaminophen **OR** acetaminophen, haloperidol lactate, heparin lock flush, heparin lock flush, heparin  lock flush, heparin lock flush, heparin lock flush, heparin lock flush, magnesium hydroxide, metoprolol, morphine injection, sodium chloride, sodium chloride, sodium chloride, sodium chloride,  sodium chloride, sodium chloride, ziprasidone  Assessment/ Plan:  62 AAM with progressive mantle cell lymphoma, hx left sided hydronephrosis and hydroureter. S/p ureteral stent placement    1.  Acute renal failure due to ATN. 2.  Sepsis with klebsiella oxytoca. 3.  Hypernatremia. 4.  Hypokalemia.  Plan:  Renal function about the same however good UOP noted therefore no indication for HD.  Na appears to have stabilized in the mid 150s.  May be due to ongoing GI losses.  Will increase D5W to 200cc/hr.  This fluid is supplemented with KCL as well.  Platelets a bit higher today as well.  Seems a bit more awake today.  However overall he remains quite ill as before and he has a guarded prognosis.     LOS: 9 Caleb Francis 6/26/201611:37 AM

## 2014-12-08 NOTE — Progress Notes (Signed)
[facility] Hematology/Oncology Progress Note  Date of admission: 11/29/2014  Hospital day:  12/08/2014  Chief Complaint: [name] is a 69 y.o. 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  Subjective:  The patient is alert and interactive in the intensive care unit. He is following commands. He has had no GI bleeding overnight.  Social History: Family is not in the room.  Nursing, Dr Ola Spurr, and Dr. Benjie Karvonen are at bedside.  Allergies: No Known Allergies  Scheduled Medications: . acetaminophen  650 mg Oral Once  . antiseptic oral rinse  7 mL Mouth Rinse BID  . aspirin  324 mg Oral Once  . cefTAZidime (FORTAZ)  IV  2 g Intravenous Q24H  . diphenhydrAMINE  25 mg Intravenous Once  . docusate  50 mg Oral BID  . filgrastim  480 mcg Subcutaneous Once  . filgrastim  480 mcg Subcutaneous Daily  . insulin aspart  1-3 Units Subcutaneous Q6H  . lactulose  30 g Oral BID  . methylPREDNISolone (SOLU-MEDROL) injection  60 mg Intravenous Once  . metronidazole  500 mg Intravenous Q8H  . scopolamine  1 patch Transdermal Q72H  . sennosides  5 mL Oral BID  . sodium chloride  3 mL Intravenous Q12H  . thiamine IV  100 mg Intravenous Daily  . Vancomycin  750 mg Intravenous Q24H  . ziprasidone  10 mg Intramuscular QHS    Review of Systems: GENERAL:  Interactive.  No fevers, sweats or weight loss. PERFORMANCE STATUS (ECOG):  4 HEENT:  No visual changes, mouth sores or tenderness. Lungs: No shortness of breath. Cardiac:  No chest pain. GI:  Denis any abdominal discomfort. GU:  No urinary symptoms. Musculoskeletal:  No back pain.  No joint pain.  No muscle tenderness. Extremities:  No pain or swelling. Neuro:  Denies headache, numbness or weakness. Pain:  Denies pain. Review of systems:  All other systems reviewed and found to be negative.  Physical Exam: Blood  pressure 134/86, pulse 110, temperature 98.2 F (36.8 C), temperature source Axillary, resp. rate 25, height 6' (1.829 m), weight 142 lb 3.2 oz (64.5 kg), SpO2 100 %.  GENERAL: Chronically ill appearing gentleman lying in bed in the ICU. MENTAL STATUS: Alert and interactive.  Answers questions with single word (yes/no).  HEAD: Thin short graying hair. Normocephalic, atraumatic, face symmetric, no Cushingoid features. EYES: Brown eyes. Pupils equal round and reactive to light and accomodation. No conjunctivitis or scleral icterus. ENT: NG tube in place. Oropharynx clear. Poor dentition.  RESPIRATORY: No rales, wheezes or rhonchi. CARDIOVASCULAR: Regular rate and rhythm without murmur, rub or gallop. ABDOMEN: Soft, with active bowel sounds, and no hepatosplenomegaly. No tenderness. No masses. SKIN: No rashes, ulcers or lesions. EXTREMITIES: No edema, no skin discoloration or tenderness. No palpable cords. NEUROLOGICAL: Eyes open.  Calm.  Follows commands (squeezes with both hands and moves toes). PSYCH:  Appropriate.  Results for orders placed or performed during the hospital encounter of 11/29/14 (from the past 48 hour(s))  Prepare Pheresed Platelets     Status: None (Preliminary result)   Collection Time: 12/06/14  1:00 PM  Result Value Ref Range   Unit Number [id number]    Blood Component Type PLTPHER LI2    Unit division 00    Status of Unit ISSUED    Transfusion Status OK TO TRANSFUSE   Prepare Pheresed Platelets  Status: None   Collection Time: 12/06/14  2:00 PM  Result Value Ref Range   Unit Number [id number]    Blood Component Type PLTPHER LRI1    Unit division 00    Status of Unit ISSUED,FINAL    Transfusion Status OK TO TRANSFUSE    Unit Number [id number]    Blood Component Type PLTP LI1 PAS    Unit division 00    Status of Unit ISSUED,FINAL    Transfusion Status OK TO TRANSFUSE   Glucose, capillary     Status: Abnormal   Collection Time:  12/06/14  3:01 PM  Result Value Ref Range   Glucose-Capillary 165 (H) 65 - 99 mg/dL  Glucose, capillary     Status: Abnormal   Collection Time: 12/06/14  6:18 PM  Result Value Ref Range   Glucose-Capillary 149 (H) 65 - 99 mg/dL  Culture, blood (routine x 2)     Status: None (Preliminary result)   Collection Time: 12/06/14  8:08 PM  Result Value Ref Range   Specimen Description BLOOD    Special Requests NONE    Culture  Setup Time      GRAM POSITIVE COCCI ANAEROBIC BOTTLE ONLY CRITICAL RESULT CALLED TO, READ BACK BY AND VERIFIED WITH: JENNIFER BEZARD AT 3825 12/08/14.PMH CONFIRMED BY RWW    Culture      GRAM POSITIVE COCCI ANAEROBIC BOTTLE ONLY IDENTIFICATION TO FOLLOW    Report Status PENDING   Culture, blood (routine x 2)     Status: None (Preliminary result)   Collection Time: 12/06/14  8:40 PM  Result Value Ref Range   Specimen Description BLOOD    Special Requests NONE    Culture NO GROWTH 2 DAYS    Report Status PENDING   Glucose, capillary     Status: Abnormal   Collection Time: 12/06/14 11:39 PM  Result Value Ref Range   Glucose-Capillary 207 (H) 65 - 99 mg/dL  CBC     Status: Abnormal   Collection Time: 12/07/14  5:55 AM  Result Value Ref Range   WBC 0.6 (LL) 3.8 - 10.6 K/uL    Comment: RESULT REPEATED AND VERIFIED CRITICAL VALUE NOTED.  VALUE IS CONSISTENT WITH PREVIOUSLY REPORTED AND CALLED VALUE. PREVIOUSLY CALLED AT 0530 12/06/14.PMH    RBC 2.48 (L) 4.40 - 5.90 MIL/uL   Hemoglobin 7.0 (L) 13.0 - 18.0 g/dL   HCT 21.4 (L) 40.0 - 52.0 %   MCV 86.3 80.0 - 100.0 fL   MCH 28.3 26.0 - 34.0 pg   MCHC 32.8 32.0 - 36.0 g/dL   RDW 19.9 (H) 11.5 - 14.5 %   Platelets 11 (LL) 150 - 440 K/uL    Comment: RESULT REPEATED AND VERIFIED CRITICAL VALUE NOTED.  VALUE IS CONSISTENT WITH PREVIOUSLY REPORTED AND CALLED VALUE. PREVIOUSLY CALLED AT 0530 12/06/14.PMH PLATELET COUNT CONFIRMED BY SMEAR   Basic metabolic panel     Status: Abnormal   Collection Time: 12/07/14  5:55  AM  Result Value Ref Range   Sodium 156 (H) 135 - 145 mmol/L   Potassium 4.4 3.5 - 5.1 mmol/L   Chloride 125 (H) 101 - 111 mmol/L   CO2 21 (L) 22 - 32 mmol/L   Glucose, Bld 253 (H) 65 - 99 mg/dL   BUN 46 (H) 6 - 20 mg/dL   Creatinine, Ser 2.57 (H) 0.61 - 1.24 mg/dL   Calcium 8.9 8.9 - 10.3 mg/dL   GFR calc non Af Amer 24 (L) >60 mL/min   GFR calc  Af Amer 28 (L) >60 mL/min    Comment: (NOTE) The eGFR has been calculated using the CKD EPI equation. This calculation has not been validated in all clinical situations. eGFR's persistently <60 mL/min signify possible Chronic Kidney Disease.    Anion gap 10 5 - 15  Magnesium     Status: None   Collection Time: 12/07/14  5:55 AM  Result Value Ref Range   Magnesium 2.3 1.7 - 2.4 mg/dL  Phosphorus     Status: None   Collection Time: 12/07/14  5:55 AM  Result Value Ref Range   Phosphorus 4.0 2.5 - 4.6 mg/dL  Glucose, capillary     Status: Abnormal   Collection Time: 12/07/14  8:18 AM  Result Value Ref Range   Glucose-Capillary 226 (H) 65 - 99 mg/dL  Prepare Pheresed Platelets     Status: None (Preliminary result)   Collection Time: 12/07/14 10:25 AM  Result Value Ref Range   Unit Number [id number]    Blood Component Type PLTPH LI3 PAS    Unit division 00    Status of Unit ISSUED    Transfusion Status OK TO TRANSFUSE   Glucose, capillary     Status: Abnormal   Collection Time: 12/07/14 11:39 AM  Result Value Ref Range   Glucose-Capillary 259 (H) 65 - 99 mg/dL  Glucose, capillary     Status: Abnormal   Collection Time: 12/07/14  1:28 PM  Result Value Ref Range   Glucose-Capillary 228 (H) 65 - 99 mg/dL  Platelet count     Status: Abnormal   Collection Time: 12/07/14  1:37 PM  Result Value Ref Range   Platelets 19 (LL) 150 - 400 K/uL    Comment: CRITICAL VALUE NOTED.  VALUE IS CONSISTENT WITH PREVIOUSLY REPORTED AND CALLED VALUE. PLATELET COUNT CONFIRMED BY SMEAR   Glucose, capillary     Status: Abnormal   Collection Time:  12/07/14  6:21 PM  Result Value Ref Range   Glucose-Capillary 125 (H) 65 - 99 mg/dL  CBC     Status: Abnormal   Collection Time: 12/07/14  9:29 PM  Result Value Ref Range   WBC 0.7 (LL) 3.8 - 10.6 K/uL    Comment: RESULT REPEATED AND VERIFIED CRITICAL VALUE NOTED.  VALUE IS CONSISTENT WITH PREVIOUSLY REPORTED AND CALLED VALUE. PREVIOUSLY CALLED AT 0530 12/06/14.PMH    RBC 3.01 (L) 4.40 - 5.90 MIL/uL   Hemoglobin 8.5 (L) 13.0 - 18.0 g/dL   HCT 25.3 (L) 40.0 - 52.0 %   MCV 84.2 80.0 - 100.0 fL   MCH 28.1 26.0 - 34.0 pg   MCHC 33.4 32.0 - 36.0 g/dL   RDW 17.2 (H) 11.5 - 14.5 %   Platelets 17 (LL) 150 - 440 K/uL    Comment: RESULT REPEATED AND VERIFIED CRITICAL VALUE NOTED.  VALUE IS CONSISTENT WITH PREVIOUSLY REPORTED AND CALLED VALUE. PREVIOUSLY CALLED AT 0530 12/06/14.PMH PLATELET COUNT CONFIRMED BY SMEAR   Protime-INR     Status: Abnormal   Collection Time: 12/07/14  9:29 PM  Result Value Ref Range   Prothrombin Time 16.2 (H) 11.4 - 15.0 seconds   INR 1.28   APTT     Status: None   Collection Time: 12/07/14  9:29 PM  Result Value Ref Range   aPTT 29 24 - 36 seconds  Fibrinogen     Status: Abnormal   Collection Time: 12/07/14  9:29 PM  Result Value Ref Range   Fibrinogen 612 (H) 210 - 470 mg/dL  Glucose,  capillary     Status: Abnormal   Collection Time: 12/08/14 12:15 AM  Result Value Ref Range   Glucose-Capillary 152 (H) 65 - 99 mg/dL  Basic metabolic panel     Status: Abnormal   Collection Time: 12/08/14  5:30 AM  Result Value Ref Range   Sodium 155 (H) 135 - 145 mmol/L   Potassium 3.9 3.5 - 5.1 mmol/L   Chloride 126 (H) 101 - 111 mmol/L   CO2 20 (L) 22 - 32 mmol/L   Glucose, Bld 184 (H) 65 - 99 mg/dL   BUN 64 (H) 6 - 20 mg/dL   Creatinine, Ser 2.63 (H) 0.61 - 1.24 mg/dL   Calcium 8.5 (L) 8.9 - 10.3 mg/dL   GFR calc non Af Amer 24 (L) >60 mL/min   GFR calc Af Amer 27 (L) >60 mL/min    Comment: (NOTE) The eGFR has been calculated using the CKD EPI equation. This  calculation has not been validated in all clinical situations. eGFR's persistently <60 mL/min signify possible Chronic Kidney Disease.    Anion gap 9 5 - 15  Phosphorus     Status: Abnormal   Collection Time: 12/08/14  5:30 AM  Result Value Ref Range   Phosphorus 6.0 (H) 2.5 - 4.6 mg/dL  Magnesium     Status: None   Collection Time: 12/08/14  5:30 AM  Result Value Ref Range   Magnesium 2.4 1.7 - 2.4 mg/dL  CBC     Status: Abnormal   Collection Time: 12/08/14  5:30 AM  Result Value Ref Range   WBC 0.6 (LL) 3.8 - 10.6 K/uL    Comment: RESULT REPEATED AND VERIFIED CRITICAL VALUE NOTED.  VALUE IS CONSISTENT WITH PREVIOUSLY REPORTED AND CALLED VALUE. PREVIOUSLY CALLED AT 0530 12/06/14.PMH    RBC 2.99 (L) 4.40 - 5.90 MIL/uL   Hemoglobin 8.5 (L) 13.0 - 18.0 g/dL   HCT 25.2 (L) 40.0 - 52.0 %   MCV 84.2 80.0 - 100.0 fL   MCH 28.5 26.0 - 34.0 pg   MCHC 33.9 32.0 - 36.0 g/dL   RDW 18.6 (H) 11.5 - 14.5 %   Platelets 34 (L) 150 - 440 K/uL  Glucose, capillary     Status: Abnormal   Collection Time: 12/08/14  5:40 AM  Result Value Ref Range   Glucose-Capillary 179 (H) 65 - 99 mg/dL  Glucose, capillary     Status: Abnormal   Collection Time: 12/08/14 11:09 AM  Result Value Ref Range   Glucose-Capillary 201 (H) 65 - 99 mg/dL   Dg Chest 1 View  12/06/2014   CLINICAL DATA:  Pulmonary congestion.  EXAM: CHEST  1 VIEW  COMPARISON:  12/06/2014  FINDINGS: Nasogastric tube is in place with tip off the film but beyond the gastroesophageal junction. Gaseous distension of a viscus loop partially imaged.  Right-sided Port-A-Cath tip overlies the level of superior vena cava. The heart is enlarged. Suspect small right pleural effusion. Right basilar opacity is stable.  IMPRESSION: 1. Cardiomegaly. 2. Right basilar opacity and probable effusion. 3. Dilated viscus only partially imaged, likely stomach or colon.   Electronically Signed   By: Nolon Nations M.D.   On: 12/06/2014 19:32   Dg Chest 1  View  12/06/2014   CLINICAL DATA:  NG tube placement  EXAM: CHEST  1 VIEW  COMPARISON:  11/29/2014  FINDINGS: Cardiomegaly again noted. Right IJ Port-A-Cath is unchanged in position. NG tube in place with tip in proximal stomach. There is hazy right basilar  atelectasis or infiltrate. No pulmonary edema.  IMPRESSION: NG tube with tip in proximal stomach. Hazy right basilar atelectasis or infiltrate. No pulmonary edema.   Electronically Signed   By: Lahoma Crocker M.D.   On: 12/06/2014 14:40   Dg Abd 1 View  12/07/2014   CLINICAL DATA:  Abdominal distention  EXAM: ABDOMEN - 1 VIEW  COMPARISON:  12/06/2014; 12/05/2014  FINDINGS: Interval removal of enteric tube.  Worsening severe gas distention of the stomach. Worsening moderate gas distention of the transverse colon with index loop measuring approximately 6.5 cm in diameter.  No supine evidence of pneumoperitoneum. No definite pneumatosis or portal venous gas. Enteric contrast remains within the rectum.  Similar appearance of left-sided double-J ureteral stent.  Limited visualization lower thorax demonstrates enlarged cardiac silhouette and perihilar opacities, likely atelectasis.  No definite acute osseus abnormalities.  IMPRESSION: 1. Interval removal of enteric tube with worsening severe gaseous distention of the stomach, nonspecific though could be seen in the setting of gastric outlet obstruction. 2. Worsening gaseous distention of the colon with residual contrast seen within the rectum nonspecific though could be seen in the setting of ileus.   Electronically Signed   By: Sandi Mariscal M.D.   On: 12/07/2014 08:56   Dg Abd 1 View  12/06/2014   CLINICAL DATA:  Nasogastric tube placement.  EXAM: ABDOMEN - 1 VIEW  COMPARISON:  12/05/2014  FINDINGS: The gastric bubble is distended. The nasogastric tube tip is obscured by motion artifact but thought to be in the vicinity of the gastric cardia. Left renal stent noted. There continues to be some barium in the rectum.   IMPRESSION: 1. Nasogastric tube is indistinct due to motion artifact, but thought to terminate in the gastric cardia. Distended stomach bubble. 2. Left ureteral stent.   Electronically Signed   By: Van Clines M.D.   On: 12/06/2014 14:36   Dg Abd Portable 1v  12/08/2014   CLINICAL DATA:  Ileus.  EXAM: PORTABLE ABDOMEN - 1 VIEW  COMPARISON:  12/07/2014  FINDINGS: There is a left-sided nephro ureteral stent in place. Persistent enteric contrast material within the rectum. Colonic ileus is stable to improved from the previous exam.  IMPRESSION: 1. Stable to improved appearance of colonic ileus.   Electronically Signed   By: Kerby Moors M.D.   On: 12/08/2014 08:58    Assessment:  Caleb Francis is a 69 y.o. 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 He was admitted with Klebsiella sepsis due to pyelonephritis and associated altered mental status. He subsequently developed renal insufficiency with assciated hypernatremia. He remains in the ICU. He had an apparent platelet transfusion reaction on 12/06/2014. Blood cultures from 12/06/2014 are growing GPC in 1 of 2 bottles anaerobic bottles. He had an upper GI bleed on 12/07/2014.  Bleeding has stopped.  Hematocrit has stabilized.  He has an ileus.  He is awake and alert today.  Plan: 1. Hematology/Oncology: Day 12 s/p chemotherapy. Patient remains neutropenic. Drop in counts likely due to consumption (infection). Continue daily GCSF until Castro > 5000. Patient received 2 units PRBCs yesterday and multiple platelet transfusions.  Coags and fibrinogen normal (no evidence of DIC). Check CBC in 12 hours to ensure stability.  Premed all blood products with Tylenol and Benadryl. 2. Infectious disease: Appreciate ID consult. Ceftriaxone switched to cetazidime. On vancomycin. Flagyl added today secondary to blood cultures from 12/06/2014 are growing GPC in 1 of 2 bottles anaerobic bottles.  3. Nephrology: Hypernatremia and renal insufficiency.  D5W continues. 4. Neurology: Mental status much improved today.  Patient with metabolic encephalopathy and h/o ICU psychosis. 5. Gastroenterology:  UGI bleed resolved.  No further output from NG.  Check H/H in 12 hours to ensure stability.  Ileus.  KUB scheduled for tomorrow.  Hopefully will be able to begin po intake.  Possible speech evaluation of swallowing.  No suppositories or enemas secondary to neutropenia. 6. Disposition: Remains guarded. DNR code status.   Lequita Asal, MD  12/08/2014, 12:42 PM

## 2014-12-08 NOTE — Progress Notes (Signed)
[place] INFECTIOUS DISEASE PROGRESS NOTE Date of Admission:  11/29/2014     ID: COREE BRAME is a 69 y.o. male with sepsis and mantel cell lymphoma   Principal Problem:   Sepsis Active Problems:   Mantle cell lymphoma   Altered mental status   Chest pain   Metabolic encephalopathy   Subjective: More awake and interactive no fevers. BCX + 1/2 GPC  ROS  Eleven systems are reviewed and negative except per hpi  Medications:  Antibiotics Given (last 72 hours)    Date/Time Action Medication Dose Rate   12/05/14 1006 Given   cefTRIAXone (ROCEPHIN) 2 g in dextrose 5 % 50 mL IVPB - Premix 2 g 100 mL/hr   12/06/14 0944 Given   cefTRIAXone (ROCEPHIN) 2 g in dextrose 5 % 50 mL IVPB - Premix 2 g 100 mL/hr   12/06/14 2013 Given   vancomycin (VANCOCIN) IVPB 1000 mg/200 mL premix 1,000 mg 200 mL/hr   12/06/14 2100 Given   cefTRIAXone (ROCEPHIN) 1 g in dextrose 5 % 50 mL IVPB - Premix 1 g 100 mL/hr   12/06/14 2211 Given   piperacillin-tazobactam (ZOSYN) IVPB 4.5 g 4.5 g 200 mL/hr   12/07/14 0554 Given   piperacillin-tazobactam (ZOSYN) IVPB 4.5 g 4.5 g 200 mL/hr   12/07/14 8185 Given   vancomycin (VANCOCIN) IVPB 1000 mg/200 mL premix 1,000 mg 200 mL/hr   12/07/14 1307 Given  [blood infusing]   cefTAZidime (FORTAZ) 2 g in dextrose 5 % 50 mL IVPB 2 g 100 mL/hr   12/08/14 0809 Given   vancomycin (VANCOCIN) IVPB 750 mg/150 ml premix 750 mg 150 mL/hr     . acetaminophen  650 mg Oral Once  . antiseptic oral rinse  7 mL Mouth Rinse BID  . aspirin  324 mg Oral Once  . cefTAZidime (FORTAZ)  IV  2 g Intravenous Q24H  . diphenhydrAMINE  25 mg Intravenous Once  . docusate  50 mg Oral BID  . filgrastim  480 mcg Subcutaneous Once  . filgrastim  480 mcg Subcutaneous Daily  . insulin aspart  1-3 Units Subcutaneous Q6H  . lactulose  30 g Oral BID  . methylPREDNISolone (SOLU-MEDROL) injection  60 mg Intravenous Once  . scopolamine  1 patch Transdermal Q72H  . sennosides  5 mL Oral  BID  . sodium chloride  3 mL Intravenous Q12H  . thiamine IV  100 mg Intravenous Daily  . Vancomycin  750 mg Intravenous Q24H  . ziprasidone  10 mg Intramuscular QHS    Objective: Vital signs in last 24 hours: Temp:  [97.4 F (36.3 C)-98.4 F (36.9 C)] 98.4 F (36.9 C) (06/26 0800) Pulse Rate:  [110-123] 114 (06/26 0900) Resp:  [19-48] 27 (06/26 0900) BP: (114-147)/(65-91) 133/83 mmHg (06/26 0900) SpO2:  [100 %] 100 % (06/26 0600) Weight:  [64.5 kg (142 lb 3.2 oz)] 64.5 kg (142 lb 3.2 oz) (06/26 0500) General appearance: cachectic Head: Normocephalic, without obvious abnormality, atraumatic Eyes: conjunctivae/corneas clear. PERRL, EOM's intact. Fundi benign. Throat: lips, mucosa, and tongue normal; teeth and gums normal Resp: clear to auscultation bilaterally Cardio: regular rate and rhythm, S1, S2 normal, no murmur, click, rub or gallop GI: soft, non-tender; bowel sounds normal; no masses,  no organomegaly Extremities: extremities normal, atraumatic, no cyanosis or edema Skin: Skin color, texture, turgor normal. No rashes or lesions  Lab Results  Recent Labs  12/07/14 0555 12/07/14 2129 12/08/14 0530  WBC 0.6* 0.7* 0.6*  HGB 7.0* 8.5* 8.5*  HCT  21.4* 25.3* 25.2*  NA 156*  --  155*  K 4.4  --  3.9  CL 125*  --  126*  CO2 21*  --  20*  BUN 46*  --  64*  CREATININE 2.57*  --  2.63*    Microbiology: @micro @ Studies/Results: Dg Chest 1 View  12/06/2014   CLINICAL DATA:  Pulmonary congestion.  EXAM: CHEST  1 VIEW  COMPARISON:  12/06/2014  FINDINGS: Nasogastric tube is in place with tip off the film but beyond the gastroesophageal junction. Gaseous distension of a viscus loop partially imaged.  Right-sided Port-A-Cath tip overlies the level of superior vena cava. The heart is enlarged. Suspect small right pleural effusion. Right basilar opacity is stable.  IMPRESSION: 1. Cardiomegaly. 2. Right basilar opacity and probable effusion. 3. Dilated viscus only partially imaged,  likely stomach or colon.   Electronically Signed   By: Nolon Nations M.D.   On: 12/06/2014 19:32   Dg Chest 1 View  12/06/2014   CLINICAL DATA:  NG tube placement  EXAM: CHEST  1 VIEW  COMPARISON:  11/29/2014  FINDINGS: Cardiomegaly again noted. Right IJ Port-A-Cath is unchanged in position. NG tube in place with tip in proximal stomach. There is hazy right basilar atelectasis or infiltrate. No pulmonary edema.  IMPRESSION: NG tube with tip in proximal stomach. Hazy right basilar atelectasis or infiltrate. No pulmonary edema.   Electronically Signed   By: Lahoma Crocker M.D.   On: 12/06/2014 14:40   Dg Abd 1 View  12/07/2014   CLINICAL DATA:  Abdominal distention  EXAM: ABDOMEN - 1 VIEW  COMPARISON:  12/06/2014; 12/05/2014  FINDINGS: Interval removal of enteric tube.  Worsening severe gas distention of the stomach. Worsening moderate gas distention of the transverse colon with index loop measuring approximately 6.5 cm in diameter.  No supine evidence of pneumoperitoneum. No definite pneumatosis or portal venous gas. Enteric contrast remains within the rectum.  Similar appearance of left-sided double-J ureteral stent.  Limited visualization lower thorax demonstrates enlarged cardiac silhouette and perihilar opacities, likely atelectasis.  No definite acute osseus abnormalities.  IMPRESSION: 1. Interval removal of enteric tube with worsening severe gaseous distention of the stomach, nonspecific though could be seen in the setting of gastric outlet obstruction. 2. Worsening gaseous distention of the colon with residual contrast seen within the rectum nonspecific though could be seen in the setting of ileus.   Electronically Signed   By: Sandi Mariscal M.D.   On: 12/07/2014 08:56   Dg Abd 1 View  12/06/2014   CLINICAL DATA:  Nasogastric tube placement.  EXAM: ABDOMEN - 1 VIEW  COMPARISON:  12/05/2014  FINDINGS: The gastric bubble is distended. The nasogastric tube tip is obscured by motion artifact but thought to  be in the vicinity of the gastric cardia. Left renal stent noted. There continues to be some barium in the rectum.  IMPRESSION: 1. Nasogastric tube is indistinct due to motion artifact, but thought to terminate in the gastric cardia. Distended stomach bubble. 2. Left ureteral stent.   Electronically Signed   By: Van Clines M.D.   On: 12/06/2014 14:36   Dg Abd Portable 1v  12/08/2014   CLINICAL DATA:  Ileus.  EXAM: PORTABLE ABDOMEN - 1 VIEW  COMPARISON:  12/07/2014  FINDINGS: There is a left-sided nephro ureteral stent in place. Persistent enteric contrast material within the rectum. Colonic ileus is stable to improved from the previous exam.  IMPRESSION: 1. Stable to improved appearance of colonic ileus.  Electronically Signed   By: Kerby Moors M.D.   On: 12/08/2014 08:58    Assessment/Plan: HOKE BAER is a 69 y.o. male with mantle cell lymphoma undergoing chemo admitted 6/15 from oncology clinic with AMS, chest pain. He was found to have a UTI with U CX + and bacteremia with Klebsiella oxytoca and had been on ceftriaxone until 6/24 when he developed worsening confusion, chills and hemodynamic instability at time of platelet transfusion. He has been neutropenic as well and is getting GCSF with his chemo. He also has profound metabolic derangement with hypernatremia, ARF. He may also have an ileus and has NGT in place. Repeat bcx are from 6/24 now with GPC in 1 of 2.  Remains neutropenic, and with TCP.   Recommendations To cover the Klebsiella oxytoca and cover for neutropenia would continue ceftazidime.  GPC bacteremia in anaerobic bottle- repeat bcx today. Check echo  Cont vanco  But add flagyl as well -If  Staph aureus will need portacath removed Cont supportive care Thank you very much for the consult. Will follow with you.  [place], [place]   12/08/2014, 9:54 AM

## 2014-12-08 NOTE — Progress Notes (Signed)
Nutrition Follow-up      INTERVENTION:   (EN) Tube feeding to remain on hold at this time. If unable to restart tube feeding in am recommend considering starting TPN.  NUTRITION DIAGNOSIS:  Inadequate oral intake related to inability to eat as evidenced by NPO status.    GOAL:  Patient will meet greater than or equal to 90% of their needs (EN)    MONITOR:   (EN, EnergyIntake, Anthropometrics, Digestive Sysetm, Electrolyte/Renal Profile, Glucose Profile)  REASON FOR ASSESSMENT:  Consult New TPN/TNA  ASSESSMENT:  Tube feeding continues to remain on hold at this time.  KUB checked this am.  Spoke with RN, Malachy Mood and MD wanting to keep tube feeding off today and recheck KUB in am secondary to ileus   Gastrointestinal Profile: Noted 14ml output from NG tube on 6/25 at 1500, per RN no output this am, tube hooked to LIS, abdomen soft, hypoactive bowel sounds, no BM Last BM: 6/7   Medications: D5 at 169ml/hr, lactulose, senokot, colace  Electrolyte/Renal Profile and Glucose Profile:   Recent Labs Lab 12/06/14 0437 12/07/14 0555 12/08/14 0530  NA 154* 156* 155*  K 4.2 4.4 3.9  CL 130* 125* 126*  CO2 18* 21* 20*  BUN 42* 46* 64*  CREATININE 2.04* 2.57* 2.63*  CALCIUM 8.6* 8.9 8.5*  MG 1.9 2.3 2.4  PHOS 2.4* 4.0 6.0*  GLUCOSE 208* 253* 184*   Protein Profile:  Recent Labs Lab 12/03/14 0633 12/03/14 2359 12/06/14 0437  ALBUMIN 2.1* 2.0* 2.2*      Weight Trend since Admission: Filed Weights   12/05/14 1400 12/06/14 1404 12/08/14 0500  Weight: 152 lb 5.4 oz (69.1 kg) 153 lb 10.6 oz (69.7 kg) 142 lb 3.2 oz (64.5 kg)      Height:  Ht Readings from Last 1 Encounters:  11/29/14 6' (1.829 m)    Weight:  Wt Readings from Last 1 Encounters:  12/08/14 142 lb 3.2 oz (64.5 kg)        BMI:  Body mass index is 19.28 kg/(m^2).  Estimated Nutritional Needs:  Kcal:  1994-2357kcals, BEE: 1511kcals, TEE: IF 1.1-1.3)(AF 1.2)  Protein:  74-88g protein  (1.0-1.2g/kg)  Fluid:  1838-2282mL of fluid (25-59mL/kg)     Diet Order:  Diet NPO time specified  EDUCATION NEEDS:  No education needs identified at this time   Intake/Output Summary (Last 24 hours) at 12/08/14 1010 Last data filed at 12/08/14 0900  Gross per 24 hour  Intake   2215 ml  Output   2050 ml  Net    165 ml      HIGH Care Level Giannina Bartolome B. Zenia Resides, [place], [place] (pager)

## 2014-12-08 NOTE — Progress Notes (Signed)
I called Dr. Mike Gip with the results of CBC. She says she will place an order for platelets and 1 unit PRBCs.

## 2014-12-09 ENCOUNTER — Inpatient Hospital Stay: Payer: Medicare Other

## 2014-12-09 DIAGNOSIS — M129 Arthropathy, unspecified: Secondary | ICD-10-CM

## 2014-12-09 DIAGNOSIS — B961 Klebsiella pneumoniae [K. pneumoniae] as the cause of diseases classified elsewhere: Secondary | ICD-10-CM

## 2014-12-09 DIAGNOSIS — N1330 Unspecified hydronephrosis: Secondary | ICD-10-CM

## 2014-12-09 LAB — TYPE AND SCREEN
ABO/RH(D): O POS
Antibody Screen: NEGATIVE
Unit division: 0
Unit division: 0
Unit division: 0
Unit division: 0
Unit division: 0

## 2014-12-09 LAB — PREPARE PLATELET PHERESIS: Unit division: 0

## 2014-12-09 LAB — BASIC METABOLIC PANEL
ANION GAP: 6 (ref 5–15)
BUN: 53 mg/dL — ABNORMAL HIGH (ref 6–20)
CHLORIDE: 125 mmol/L — AB (ref 101–111)
CO2: 22 mmol/L (ref 22–32)
CREATININE: 2.25 mg/dL — AB (ref 0.61–1.24)
Calcium: 8.5 mg/dL — ABNORMAL LOW (ref 8.9–10.3)
GFR calc Af Amer: 33 mL/min — ABNORMAL LOW (ref >60)
GFR calc non Af Amer: 28 mL/min — ABNORMAL LOW (ref >60)
Glucose, Bld: 131 mg/dL — ABNORMAL HIGH (ref 65–99)
POTASSIUM: 3.1 mmol/L — AB (ref 3.5–5.1)
SODIUM: 153 mmol/L — AB (ref 135–145)

## 2014-12-09 LAB — CBC
HCT: 27.8 % — ABNORMAL LOW (ref 40.0–52.0)
Hemoglobin: 9.3 g/dL — ABNORMAL LOW (ref 13.0–18.0)
MCH: 28.7 pg (ref 26.0–34.0)
MCHC: 33.4 g/dL (ref 32.0–36.0)
MCV: 85.9 fL (ref 80.0–100.0)
PLATELETS: 7 10*3/uL — AB (ref 150–440)
RBC: 3.23 MIL/uL — ABNORMAL LOW (ref 4.40–5.90)
RDW: 17.9 % — AB (ref 11.5–14.5)
WBC: 1.3 10*3/uL — CL (ref 3.8–10.6)

## 2014-12-09 LAB — OSMOLALITY, URINE: OSMOLALITY UR: 317 mosm/kg (ref 300–900)

## 2014-12-09 LAB — GLUCOSE, CAPILLARY
GLUCOSE-CAPILLARY: 144 mg/dL — AB (ref 65–99)
Glucose-Capillary: 112 mg/dL — ABNORMAL HIGH (ref 65–99)
Glucose-Capillary: 150 mg/dL — ABNORMAL HIGH (ref 65–99)

## 2014-12-09 LAB — POTASSIUM: POTASSIUM: 3.6 mmol/L (ref 3.5–5.1)

## 2014-12-09 LAB — PHOSPHORUS: Phosphorus: 3.2 mg/dL (ref 2.5–4.6)

## 2014-12-09 LAB — MAGNESIUM: Magnesium: 2.1 mg/dL (ref 1.7–2.4)

## 2014-12-09 LAB — VANCOMYCIN, TROUGH: Vancomycin Tr: 17 ug/mL (ref 10–20)

## 2014-12-09 MED ORDER — DIPHENHYDRAMINE HCL 50 MG/ML IJ SOLN
25.0000 mg | Freq: Once | INTRAMUSCULAR | Status: AC
  Administered 2014-12-09: 25 mg via INTRAVENOUS
  Filled 2014-12-09: qty 1

## 2014-12-09 MED ORDER — POTASSIUM CHLORIDE 2 MEQ/ML IV SOLN
Freq: Once | INTRAVENOUS | Status: AC
  Administered 2014-12-09: 10:00:00 via INTRAVENOUS
  Filled 2014-12-09: qty 20

## 2014-12-09 MED ORDER — ERYTHROMYCIN LACTOBIONATE 500 MG IV SOLR
250.0000 mg | Freq: Three times a day (TID) | INTRAVENOUS | Status: DC
  Administered 2014-12-09 – 2014-12-13 (×13): 250 mg via INTRAVENOUS
  Filled 2014-12-09 (×19): qty 5

## 2014-12-09 MED ORDER — SODIUM CHLORIDE 0.9 % IJ SOLN: 10.0000 mL | INTRAMUSCULAR | Status: DC | PRN

## 2014-12-09 MED ORDER — SODIUM CHLORIDE 0.9 % IV SOLN: 250.0000 mL | Freq: Once | INTRAVENOUS | Status: DC

## 2014-12-09 MED ORDER — HEPARIN SOD (PORK) LOCK FLUSH 100 UNIT/ML IV SOLN
250.0000 [IU] | INTRAVENOUS | Status: DC | PRN
  Filled 2014-12-09: qty 3

## 2014-12-09 MED ORDER — SODIUM CHLORIDE 0.9 % IJ SOLN: 3.0000 mL | INTRAMUSCULAR | Status: DC | PRN

## 2014-12-09 MED ORDER — HEPARIN SOD (PORK) LOCK FLUSH 100 UNIT/ML IV SOLN
500.0000 [IU] | Freq: Every day | INTRAVENOUS | Status: DC | PRN
  Filled 2014-12-09: qty 5

## 2014-12-09 MED ORDER — ACETAMINOPHEN 325 MG PO TABS
650.0000 mg | ORAL_TABLET | Freq: Once | ORAL | Status: AC
  Administered 2014-12-09: 650 mg via ORAL

## 2014-12-09 NOTE — Consult Note (Signed)
Palliative Medicine Inpatient Consult Follow Up Note   Name: Caleb Francis Date: 12/09/2014 MRN: [id number]  DOB: 09-28-1945  Referring Physician: Bettey Costa, MD  Palliative Care consult requested for this 69 y.o. male for goals of medical therapy in patient with mantle cell lymphoma, getting chemotherapy, admitted with altered mental status  Caleb Caleb Francis is lying in bed in CCU. More alert but still not conversant. Son at bedside.    REVIEW OF SYSTEMS:  Patient is not able to provide ROS  CODE STATUS: DNR   PAST MEDICAL HISTORY: Past Medical History  Diagnosis Date  . Arthritis   . Hypertension   . RA (rheumatoid arthritis)   . Anemia   . Cancer     lymphoma  . History of nuclear stress test     a. 12/2013: low risk, no sig ischemia, no EKG changes, no artifact, EF 63%  . Chronic kidney disease     PAST SURGICAL HISTORY:  Past Surgical History  Procedure Laterality Date  . Back surgery    . Fracture surgery      ankle  . Portacath placement    . Cystoscopy w/ ureteral stent placement Left 10/24/2014    Procedure: CYSTOSCOPY WITH RETROGRADE PYELOGRAM/URETERAL STENT PLACEMENT;  Surgeon: Irine Seal, MD;  Location: [facility] ORS;  Service: Urology;  Laterality: Left;    Vital Signs: BP 141/82 mmHg  Pulse 117  Temp(Src) 97.9 F (36.6 C) (Axillary)  Resp 26  Ht 6' (1.829 m)  Wt 64.4 kg (141 lb 15.6 oz)  BMI 19.25 kg/m2  SpO2 100% Filed Weights   12/06/14 1404 12/08/14 0500 12/09/14 0606  Weight: 69.7 kg (153 lb 10.6 oz) 64.5 kg (142 lb 3.2 oz) 64.4 kg (141 lb 15.6 oz)    Estimated body mass index is 19.25 kg/(m^2) as calculated from the following:   Height as of this encounter: 6' (1.829 m).   Weight as of this encounter: 64.4 kg (141 lb 15.6 oz).  PHYSICAL EXAM: General: Critically ill appearing HEENT: OP clear, poor dentition Neck: Trachea midline  Cardiovascular: regular rhythm, tachycardic Pulmonary/Chest: fair air movemnt ant fields, no audible  wheeze Abdominal: Soft, nontender, hypoactive bowel sounds GU: no suprapubic tenderness Extremities: no edema BLE's Neurological: moves extremities, mumbles words, follows simple commands  Skin: no rashes Psychiatric: calm   LABS: CBC:    Component Value Date/Time   WBC 1.3* 12/09/2014 1242   WBC 6.4 07/17/2014 1121   HGB 9.3* 12/09/2014 1242   HGB 11.8* 07/17/2014 1121   HCT 27.8* 12/09/2014 1242   HCT 35.0* 07/17/2014 1121   PLT 7* 12/09/2014 1242   PLT 263 07/17/2014 1121   MCV 85.9 12/09/2014 1242   MCV 83 07/17/2014 1121   NEUTROABS 1.6 12/04/2014 0839   NEUTROABS 3.4 07/17/2014 1121   LYMPHSABS 1.0 12/04/2014 0839   LYMPHSABS 1.9 07/17/2014 1121   MONOABS 0.1* 12/04/2014 0839   MONOABS 0.8 07/17/2014 1121   EOSABS 0.1 12/04/2014 0839   EOSABS 0.2 07/17/2014 1121   BASOSABS 0.0 12/04/2014 0839   BASOSABS 0.0 07/17/2014 1121   Comprehensive Metabolic Panel:    Component Value Date/Time   NA 153* 12/09/2014 0509   NA 141 07/17/2014 1121   K 3.6 12/09/2014 1242   K 2.8* 07/17/2014 1121   CL 125* 12/09/2014 0509   CL 100 07/17/2014 1121   CO2 22 12/09/2014 0509   CO2 32 07/17/2014 1121   BUN 53* 12/09/2014 0509   BUN 9 07/17/2014 1121   CREATININE 2.25*  12/09/2014 0509   CREATININE 1.09 07/17/2014 1121   GLUCOSE 131* 12/09/2014 0509   GLUCOSE 105* 07/17/2014 1121   CALCIUM 8.5* 12/09/2014 0509   CALCIUM 8.9 07/17/2014 1121   AST 28 12/06/2014 0437   AST 17 07/17/2014 1121   ALT 20 12/06/2014 0437   ALT 12* 07/17/2014 1121   ALKPHOS 141* 12/06/2014 0437   ALKPHOS 79 07/17/2014 1121   BILITOT 2.2* 12/06/2014 0437   PROT 5.9* 12/06/2014 0437   PROT 7.3 07/17/2014 1121   ALBUMIN 2.2* 12/06/2014 0437   ALBUMIN 3.1* 07/17/2014 1121    IMPRESSION: Caleb Francis is a 69 yo man with PMH of mantle cell lymphoma dx 02/2013 on salvage chemo, HTN, RA, 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 He was hospitalzed 5/27-11/19/14 with abd pain, sepsis, A/CKD. He  was discharged to SNF and continued with chemotherapy. He was readmitted 11/29/14 with altered mental status. Urine and blood cx's + for Klebsiella. Mental status remains poor. MRI without contrast shows no acute change. EEG c/w metabolic encephalopathy. Developed hypernatremia which is slowly correcting. Mental status not yet at baseline.  Pt remains critically ill. Minimal improvement. Hospital day # 11. Discussed with son at bedside.    PLAN: As above   More than 50% of the visit was spent in counseling/coordination of care: YES  Time spent: 25 minutes

## 2014-12-09 NOTE — Progress Notes (Signed)
Subjective:  Remains critically ill Daughter at bedside UOP 3750 cc Na remains high although improving slowly    Objective:  Vital signs in last 24 hours:  Temp:  [97.9 F (36.6 C)-98.4 F (36.9 C)] 97.9 F (36.6 C) (06/27 0800) Pulse Rate:  [88-111] 107 (06/27 0500) Resp:  [15-34] 26 (06/27 0500) BP: (125-147)/(75-108) 125/83 mmHg (06/27 0500) SpO2:  [100 %] 100 % (06/27 0500) Weight:  [64.4 kg (141 lb 15.6 oz)] 64.4 kg (141 lb 15.6 oz) (06/27 0606)  Weight change: -0.1 kg (-3.5 oz) Filed Weights   12/06/14 1404 12/08/14 0500 12/09/14 0606  Weight: 69.7 kg (153 lb 10.6 oz) 64.5 kg (142 lb 3.2 oz) 64.4 kg (141 lb 15.6 oz)    Intake/Output: I/O last 3 completed shifts: In: 7619 [I.V.:4940; Blood:1200; IV Piggyback:400] Out: 5093 [Urine:4750]   Intake/Output this shift:     Physical Exam: General: Awake, ill appearing  Head: Normocephalic, atraumatic. Dry oral mucosal membranes, NG in place  Eyes: Anicteric  Neck: Supple, trachea midline  Lungs:  Clear to auscultation normal effort  Heart: S1S2 tachycardic  Abdomen:  Soft, nontender, slight distension  Extremities: no peripheral edema  Neurologic: Awake, able to verbalize at times, not following commands consistently  Skin: No acute rashes  Access:     Basic Metabolic Panel:  Recent Labs Lab 12/05/14 0530 12/05/14 2016 12/06/14 0437 12/07/14 0555 12/08/14 0530 12/09/14 0509  NA 164* 158* 154* 156* 155* 153*  K 3.2* 3.7 4.2 4.4 3.9 3.1*  CL >130* >130* 130* 125* 126* 125*  CO2 18* 19* 18* 21* 20* 22  GLUCOSE 150* 146* 208* 253* 184* 131*  BUN 57* 48* 42* 46* 64* 53*  CREATININE 2.37* 2.13* 2.04* 2.57* 2.63* 2.25*  CALCIUM 9.1 8.9 8.6* 8.9 8.5* 8.5*  MG 2.1  --  1.9 2.3 2.4 2.1  PHOS 2.5  --  2.4* 4.0 6.0* 3.2    Liver Function Tests:  Recent Labs Lab 12/03/14 0633 12/03/14 2359 12/06/14 0437  AST  --  20 28  ALT  --  17 20  ALKPHOS  --  141* 141*  BILITOT  --  2.1* 2.2*  PROT  --  5.9*  5.9*  ALBUMIN 2.1* 2.0* 2.2*   No results for input(s): LIPASE, AMYLASE in the last 168 hours.  Recent Labs Lab 12/03/14 2247  AMMONIA 53*    CBC:  Recent Labs Lab 12/04/14 0839  12/06/14 0437 12/07/14 0555 12/07/14 1337 12/07/14 2129 12/08/14 0530 12/08/14 1526  WBC 2.7*  < > 1.0* 0.6*  --  0.7* 0.6* 0.9*  NEUTROABS 1.6  --   --   --   --   --   --   --   HGB 7.2*  < > 7.2* 7.0*  --  8.5* 8.5* 7.9*  HCT 22.2*  < > 21.6* 21.4*  --  25.3* 25.2* 23.7*  MCV 87.0  < > 86.6 86.3  --  84.2 84.2 84.2  PLT 24*  < > 9* 11* 19* 17* 34* 16*  < > = values in this interval not displayed.  Cardiac Enzymes: No results for input(s): CKTOTAL, CKMB, CKMBINDEX, TROPONINI in the last 168 hours.  BNP: Invalid input(s): POCBNP  CBG:  Recent Labs Lab 12/08/14 1109 12/08/14 1706 12/08/14 2115 12/08/14 2352 12/09/14 0525  GLUCAP 201* 153* 149* 198* 112*    Microbiology: Results for orders placed or performed during the hospital encounter of 11/29/14  MRSA PCR Screening     Status: None  Collection Time: 11/29/14 12:13 PM  Result Value Ref Range Status   MRSA by PCR NEGATIVE NEGATIVE Final    Comment:        The GeneXpert MRSA Assay (FDA approved for NASAL specimens only), is one component of a comprehensive MRSA colonization surveillance program. It is not intended to diagnose MRSA infection nor to guide or monitor treatment for MRSA infections.   Culture, blood (routine x 2)     Status: None   Collection Time: 11/29/14  1:19 PM  Result Value Ref Range Status   Specimen Description BLOOD  Final   Special Requests Immunocompromised  Final   Culture NO GROWTH 5 DAYS  Final   Report Status 12/04/2014 FINAL  Final  Urine culture     Status: None   Collection Time: 11/30/14 10:12 AM  Result Value Ref Range Status   Specimen Description URINE, CATHETERIZED  Final   Special Requests Immunocompromised  Final   Culture NO GROWTH 2 DAYS  Final   Report Status 12/02/2014  FINAL  Final  Culture, blood (routine x 2)     Status: None (Preliminary result)   Collection Time: 12/06/14  8:08 PM  Result Value Ref Range Status   Specimen Description BLOOD  Final   Special Requests NONE  Final   Culture  Setup Time   Final    GRAM POSITIVE COCCI IN BOTH AEROBIC AND ANAEROBIC BOTTLES CRITICAL RESULT CALLED TO, READ BACK BY AND VERIFIED WITH: JENNIFER BEZARD AT 2111 12/08/14.PMH CONFIRMED BY [facility]    Culture   Final    GRAM POSITIVE COCCI IN BOTH AEROBIC AND ANAEROBIC BOTTLES IDENTIFICATION TO FOLLOW    Report Status PENDING  Incomplete  Culture, blood (routine x 2)     Status: None (Preliminary result)   Collection Time: 12/06/14  8:40 PM  Result Value Ref Range Status   Specimen Description BLOOD  Final   Special Requests NONE  Final   Culture NO GROWTH 3 DAYS  Final   Report Status PENDING  Incomplete  Culture, blood (routine x 2)     Status: None (Preliminary result)   Collection Time: 12/08/14 11:40 AM  Result Value Ref Range Status   Specimen Description BLOOD  Final   Special Requests Normal  Final   Culture NO GROWTH < 24 HOURS  Final   Report Status PENDING  Incomplete  Culture, blood (routine x 2)     Status: None (Preliminary result)   Collection Time: 12/08/14 11:49 AM  Result Value Ref Range Status   Specimen Description BLOOD  Final   Special Requests Normal  Final   Culture NO GROWTH < 24 HOURS  Final   Report Status PENDING  Incomplete    Coagulation Studies:  Recent Labs  12/07/14 2129  LABPROT 16.2*  INR 1.28    Urinalysis: No results for input(s): COLORURINE, LABSPEC, PHURINE, GLUCOSEU, HGBUR, BILIRUBINUR, KETONESUR, PROTEINUR, UROBILINOGEN, NITRITE, LEUKOCYTESUR in the last 72 hours.  Invalid input(s): APPERANCEUR    Imaging: Dg Abd Portable 1v  12/08/2014   CLINICAL DATA:  Ileus.  EXAM: PORTABLE ABDOMEN - 1 VIEW  COMPARISON:  12/07/2014  FINDINGS: There is a left-sided nephro ureteral stent in place. Persistent enteric  contrast material within the rectum. Colonic ileus is stable to improved from the previous exam.  IMPRESSION: 1. Stable to improved appearance of colonic ileus.   Electronically Signed   By: Kerby Moors M.D.   On: 12/08/2014 08:58     Medications:   .  dextrose 5 % with KCl 20 mEq / L 20 mEq (12/08/14 1203)  . feeding supplement (JEVITY 1.5 CAL/FIBER) 1,000 mL (12/06/14 1044)  . free water    . norepinephrine 4 mg (12/06/14 2017)   . acetaminophen  650 mg Oral Once  . antiseptic oral rinse  7 mL Mouth Rinse BID  . aspirin  324 mg Oral Once  . cefTAZidime (FORTAZ)  IV  2 g Intravenous Q24H  . diphenhydrAMINE  25 mg Intravenous Once  . docusate  100 mg Oral BID  . erythromycin  250 mg Intravenous 3 times per day  . filgrastim  480 mcg Subcutaneous Once  . filgrastim  480 mcg Subcutaneous Daily  . insulin aspart  1-3 Units Subcutaneous Q6H  . lactulose  30 g Oral BID  . methylPREDNISolone (SOLU-MEDROL) injection  60 mg Intravenous Once  . metronidazole  500 mg Intravenous Q8H  . small volume/piggyback builder   Intravenous Once  . scopolamine  1 patch Transdermal Q72H  . sennosides  10 mL Oral BID  . sodium chloride  3 mL Intravenous Q12H  . thiamine IV  100 mg Intravenous Daily  . Vancomycin  750 mg Intravenous Q24H  . ziprasidone  10 mg Intramuscular QHS   acetaminophen **OR** acetaminophen, haloperidol lactate, heparin lock flush, heparin lock flush, heparin lock flush, heparin lock flush, heparin lock flush, heparin lock flush, heparin lock flush, heparin lock flush, magnesium hydroxide, metoprolol, morphine injection, sodium chloride, sodium chloride, sodium chloride, sodium chloride, sodium chloride, sodium chloride, sodium chloride, sodium chloride, ziprasidone  Assessment/ Plan:  38 AAM with progressive mantle cell lymphoma, hx left sided hydronephrosis and hydroureter. S/p ureteral stent placement    1.  Acute renal failure due to ATN. 2.  Sepsis with klebsiella  oxytoca. 3.  Hypernatremia.  4.  Hypokalemia.  Plan:  Renal function about the same however good UOP noted therefore no indication for HD.   Continue high dose of d5W. Replace Kcl prn Check urine lytes     LOS: 10 Mariann Palo 6/27/20169:59 AM

## 2014-12-09 NOTE — Progress Notes (Signed)
Pt received one unit platelets and 1 unit prbc.; No reaction noted. Pt has non productive cough but is able to clear airway by self. Family at bedside throughout night. No other changes noted this shift.

## 2014-12-09 NOTE — Progress Notes (Signed)
Nutrition Follow-up    INTERVENTION:   (EN): recommend trial of trophic feedings when clinically feasible, repeat abdominal xray pending Coordination of Care: discussed nutritional poc with MD Mody; pt not alert enough to participate in SLP eval at this time. MD aware that pt with inadequate nutrition since admission.   NUTRITION DIAGNOSIS:  Inadequate oral intake related to inability to eat as evidenced by NPO status.  GOAL:  Patient will meet greater than or equal to 90% of their needs (EN)  MONITOR:   (EN, EnergyIntake, Anthropometrics, Digestive Sysetm, Electrolyte/Renal Profile, Glucose Profile)   ASSESSMENT:  Pt more alert but confused.   Diet Order: NPO  Digestive System: no recorded output via NG, no BM, repeat abdominal xray pending  Recent Labs Lab 12/07/14 0555 12/08/14 0530 12/09/14 0509  NA 156* 155* 153*  K 4.4 3.9 3.1*  CL 125* 126* 125*  CO2 21* 20* 22  BUN 46* 64* 53*  CREATININE 2.57* 2.63* 2.25*  CALCIUM 8.9 8.5* 8.5*  MG 2.3 2.4 2.1  PHOS 4.0 6.0* 3.2  GLUCOSE 253* 184* 131*   Nutritional Anemia Profile:  CBC Latest Ref Rng 12/08/2014 12/08/2014 12/07/2014  WBC 3.8 - 10.6 K/uL 0.9(LL) 0.6(LL) 0.7(LL)  Hemoglobin 13.0 - 18.0 g/dL 7.9(L) 8.5(L) 8.5(L)  Hematocrit 40.0 - 52.0 % 23.7(L) 25.2(L) 25.3(L)  Platelets 150 - 440 K/uL 16(LL) 34(L) 17(LL)    Meds: D5 with KCL at 200 ml/hr (816 kcals), erythromycin, colace, lactulose, milk of mag  Height:  Ht Readings from Last 1 Encounters:  11/29/14 6' (1.829 m)    Weight:  Wt Readings from Last 1 Encounters:  12/09/14 141 lb 15.6 oz (64.4 kg)   Filed Weights   12/06/14 1404 12/08/14 0500 12/09/14 0606  Weight: 153 lb 10.6 oz (69.7 kg) 142 lb 3.2 oz (64.5 kg) 141 lb 15.6 oz (64.4 kg)    BMI:  Body mass index is 19.25 kg/(m^2).  Estimated Nutritional Needs:  Kcal:  1994-2357kcals, BEE: 1511kcals, TEE: IF 1.1-1.3)(AF 1.2)  Protein:  74-88g protein (1.0-1.2g/kg)  Fluid:  1838-2232mL  of fluid (25-21mL/kg)  Skin:   no pressure ulcer documented  Diet Order:  Diet NPO time specified  EDUCATION NEEDS:  No education needs identified at this time   Intake/Output Summary (Last 24 hours) at 12/09/14 1432 Last data filed at 12/09/14 1418  Gross per 24 hour  Intake   6875 ml  Output   5575 ml  Net   1300 ml    HIGH Care Level'  Kerman Passey [place], [place], LDN [phone] Pager

## 2014-12-09 NOTE — Care Management (Signed)
Important Message  Patient Details  Name: Caleb Francis MRN: [id number] Date of Birth: 05/24/1946   Medicare Important Message Given:  Yes-third notification given    [name] 12/09/2014, 2:39 PM

## 2014-12-09 NOTE — Progress Notes (Signed)
[place] at [facility]                                                                                                                                                                                            Patient Demographics   Caleb Francis, is a 69 y.o. male, DOB - July 05, 1945, GNF:[id number]  Admit date - 11/29/2014   Admitting Physician Aldean Jewett, MD  Outpatient Primary MD for the patient is No PCP Per Patient   Subjective: Patient is lethargic this morning. Daughter is at bedside. No acute events overnight.   Review of Systems:   Review of Systems  Unable to perform ROS: critical illness      Vitals:   Filed Vitals:   12/09/14 0400 12/09/14 0500 12/09/14 0606 12/09/14 0800  BP: 135/78 125/83    Pulse: 109 107    Temp:    97.9 F (36.6 C)  TempSrc:    Axillary  Resp: 29 26    Height:      Weight:   64.4 kg (141 lb 15.6 oz)   SpO2: 100% 100%      Wt Readings from Last 3 Encounters:  12/09/14 64.4 kg (141 lb 15.6 oz)  11/27/14 68.2 kg (150 lb 5.7 oz)  11/25/14 70.761 kg (156 lb)     Intake/Output Summary (Last 24 hours) at 12/09/14 1043 Last data filed at 12/09/14 0600  Gross per 24 hour  Intake   5190 ml  Output   3750 ml  Net   1440 ml    Physical Exam:   GENERAL:  Has NGT placed critically ill-appearing HEAD, EYES, EARS, NOSE AND THROAT NGT placed PERRLA Atraumatic  NECK: Supple. There is no jugular venous distention. No bruits, no lymphadenopathy, no thyromegaly.  HEART:  Tachycardic 2/6 systolic ejection murmur no rubs LUNGS: Upper airway rhonchi no wheezing or rales  ABDOMEN: Hypoactive bowel sounds with no rebound/guarding No tenderness EXTREMITIES: No evidence of any cyanosis, clubbing, .  NEUROLOGIC: Lethargic  SKIN: Moist and warm with no rashes appreciated.  Psych:    no agitation reported overnight.  Radiology Reports 6/26 KUB: improved COLONIC  ileus     CBC  Recent Labs Lab  12/04/14 0839  12/06/14 0437 12/07/14 0555 12/07/14 1337 12/07/14 2129 12/08/14 0530 12/08/14 1526  WBC 2.7*  < > 1.0* 0.6*  --  0.7* 0.6* 0.9*  HGB 7.2*  < > 7.2* 7.0*  --  8.5* 8.5* 7.9*  HCT 22.2*  < > 21.6* 21.4*  --  25.3* 25.2* 23.7*  PLT 24*  < > 9* 11* 19* 17* 34*  16*  MCV 87.0  < > 86.6 86.3  --  84.2 84.2 84.2  MCH 28.2  < > 28.9 28.3  --  28.1 28.5 28.2  MCHC 32.5  < > 33.4 32.8  --  33.4 33.9 33.4  RDW 20.0*  < > 20.3* 19.9*  --  17.2* 18.6* 18.1*  LYMPHSABS 1.0  --   --   --   --   --   --   --   MONOABS 0.1*  --   --   --   --   --   --   --   EOSABS 0.1  --   --   --   --   --   --   --   BASOSABS 0.0  --   --   --   --   --   --   --   < > = values in this interval not displayed.  Chemistries   Recent Labs Lab 12/03/14 2359  12/05/14 0530 12/05/14 2016 12/06/14 0437 12/07/14 0555 12/08/14 0530 12/09/14 0509  NA  --   < > 164* 158* 154* 156* 155* 153*  K  --   < > 3.2* 3.7 4.2 4.4 3.9 3.1*  CL  --   < > >130* >130* 130* 125* 126* 125*  CO2  --   < > 18* 19* 18* 21* 20* 22  GLUCOSE  --   < > 150* 146* 208* 253* 184* 131*  BUN  --   < > 57* 48* 42* 46* 64* 53*  CREATININE  --   < > 2.37* 2.13* 2.04* 2.57* 2.63* 2.25*  CALCIUM  --   < > 9.1 8.9 8.6* 8.9 8.5* 8.5*  MG  --   < > 2.1  --  1.9 2.3 2.4 2.1  AST 20  --   --   --  28  --   --   --   ALT 17  --   --   --  20  --   --   --   ALKPHOS 141*  --   --   --  141*  --   --   --   BILITOT 2.1*  --   --   --  2.2*  --   --   --   < > = values in this interval not displayed. ------------------------------------------------------------------------------------------------------------------ estimated creatinine clearance is 29 mL/min (by C-G formula based on Cr of 2.25). ------------------------------------------------------------------------------------------------------------------ No results for input(s): HGBA1C in the last 72  hours. ------------------------------------------------------------------------------------------------------------------ No results for input(s): CHOL, HDL, LDLCALC, TRIG, CHOLHDL, LDLDIRECT in the last 72 hours. ------------------------------------------------------------------------------------------------------------------ No results for input(s): TSH, T4TOTAL, T3FREE, THYROIDAB in the last 72 hours.  Invalid input(s): FREET3 ------------------------------------------------------------------------------------------------------------------ No results for input(s): VITAMINB12, FOLATE, FERRITIN, TIBC, IRON, RETICCTPCT in the last 72 hours.  Coagulation profile  Recent Labs Lab 12/07/14 2129  INR 1.28    No results for input(s): DDIMER in the last 72 hours.  Cardiac Enzymes No results for input(s): CKMB, TROPONINI, MYOGLOBIN in the last 168 hours.  Invalid input(s): CK ------------------------------------------------------------------------------------------------------------------ Invalid input(s): [place]  This is a 69 year old male with mantle cell lymphoma undergoing chemotherapy who was admitted June 15 from oncology clinic with altered mental status and chest pain.    #1 Sepsis: Patient's urine culture and blood cultures were positive for Klebsiella on admission. He  was initially placed on Rocephin.  Due to his low blood pressure and elevated  heart rates on 12/06/2014 he was started on Zosyn and vancomycin.  Dr. Ola Spurr recommends changing Rocephin to ceftaz  to cover for KLEBSIELLA as well as neutropenia.  Repeat blood cx on 12/06/2014 are growing gram-positive cocci in both aerobic and anaerobic bottles. Vancomycin and Flagyl will be continued until final culture report. If the patient has staph in his blood cultures he may need to have the port removed. Repeat blood cultures on 12/09/2014 are negative to date.  He was temporarily placed on pressors on  6/24 with the transfusion reaction  but has now been off of this and BP stable. He remins CRITICALLY ILL   #2 altered mental status - This is due to metabolic encephalopathy due to sepsis and urinary tract infection/ARF/Hypernatremia along with ICU delirium. - CT head/MRI brain showing no evidence of acute pathology.  - cont. D5W for hypernatremia, cont. IV abx for UTI/bacteremia and follow mental status.  - appreciate neurology consultation   #3 chest pain on admission: No EKG changes to suggest ischemia.  Continue to monitor for any further cardiac symptoms  #4 Klebsiella urinary tract infection with bacteremia: He is now on CEFTAZADIME for broader coverage after hypotension and tachycardia episode on 6/24.  5 Mantle cell lymphoma: Patient is on salvage chemotherapy. Patient follows with Dr. Mike Gip.   He is s/p chemo He is on GCSF and remains neutrapenic.  #6. Panctytopenia: Due to chemotherapy and Mantle cell lymphoma, patient is currently on Neupogen. Patient is s/p 2 units PRBC on 6/25 and has received PLT transfusion psat several days. Patient needs premedication prior to any transfusions including Benadryl and Tylenol.    #7. Acute renal failure due to ATN : Appreciate nephrology input. We will continue with D5 as prescribed by nephrology for his hypernatremia. #8 Hypokalemia - continue supplementation when necessary #9 Thrombocytopenia This is due to sepsis.  Platelet count has improved.  #10. Sinus tachycardia: Possibly related to underlying dehydration/hypernatremia. Patient will continue telemetry monitoring.   12 colonic Ileus with GI bleed: Patient currently has an NG tube placed. KUB pending for this a.m. Continue with NG tube  NO RECTAL MEDS DUE TO NEUTRAPENIA  12. Hypernatremia unclear etiology: Remains elevated. Plan per Nephrololgy. 13. GI bleed: Patient currently has an NG tube placed. We will continue PPI and CBC monitoring. At this time we will continue  supportive therapy due to low platelets GI will be unable to do any invasive procedure.  Prognosis is very poor  Palliative care will has now signed off. Patient is critically ill and high risk of cardiopulmonary arrest DNR  DVT Prophylaxis   SCD's   Lab Results  Component Value Date   PLT 16* 12/08/2014     Total Time Spent in minutes - 35 min   plan of care discussed with the patient's daughter at bedside.    Greater than 50% of time spent in care and coordination  Giulia Hickey M.D on 12/09/2014 at 10:43 AM  Between 7am to 6pm - Pager - [phone]  After 6pm go to [email or web address] - password [email or web address] [facility]   Office  [phone]

## 2014-12-09 NOTE — Progress Notes (Signed)
[place] INFECTIOUS DISEASE PROGRESS NOTE Date of Admission:  11/29/2014     ID: Caleb Francis is a 69 y.o. male with sepsis and mantel cell lymphoma   Principal Problem:   Sepsis Active Problems:   Mantle cell lymphoma   Altered mental status   Chest pain   Metabolic encephalopathy   Subjective: More awake and interactive no fevers. BCX + 1/2 GPC.   ROS  Eleven systems are reviewed and negative except per hpi  Medications:  Antibiotics Given (last 72 hours)    Date/Time Action Medication Dose Rate   12/06/14 2013 Given   vancomycin (VANCOCIN) IVPB 1000 mg/200 mL premix 1,000 mg 200 mL/hr   12/06/14 2100 Given   cefTRIAXone (ROCEPHIN) 1 g in dextrose 5 % 50 mL IVPB - Premix 1 g 100 mL/hr   12/06/14 2211 Given   piperacillin-tazobactam (ZOSYN) IVPB 4.5 g 4.5 g 200 mL/hr   12/07/14 0554 Given   piperacillin-tazobactam (ZOSYN) IVPB 4.5 g 4.5 g 200 mL/hr   12/07/14 2831 Given   vancomycin (VANCOCIN) IVPB 1000 mg/200 mL premix 1,000 mg 200 mL/hr   12/07/14 1307 Given  [blood infusing]   cefTAZidime (FORTAZ) 2 g in dextrose 5 % 50 mL IVPB 2 g 100 mL/hr   12/08/14 0809 Given   vancomycin (VANCOCIN) IVPB 750 mg/150 ml premix 750 mg 150 mL/hr   12/08/14 1057 Given   cefTAZidime (FORTAZ) 2 g in dextrose 5 % 50 mL IVPB 2 g 100 mL/hr   12/08/14 1116 Given   metroNIDAZOLE (FLAGYL) IVPB 500 mg 500 mg 100 mL/hr   12/08/14 1807 Given   metroNIDAZOLE (FLAGYL) IVPB 500 mg 500 mg 100 mL/hr   12/09/14 0310 Given   metroNIDAZOLE (FLAGYL) IVPB 500 mg 500 mg 100 mL/hr   12/09/14 0936 Given   cefTAZidime (FORTAZ) 2 g in dextrose 5 % 50 mL IVPB 2 g 100 mL/hr   12/09/14 0951 Given   erythromycin 250 mg in sodium chloride 0.9 % 100 mL IVPB 250 mg 100 mL/hr   12/09/14 1041 Given   vancomycin (VANCOCIN) IVPB 750 mg/150 ml premix 750 mg 150 mL/hr   12/09/14 1042 Given   metroNIDAZOLE (FLAGYL) IVPB 500 mg 500 mg 100 mL/hr   12/09/14 1357 Given   erythromycin 250 mg in sodium  chloride 0.9 % 100 mL IVPB 250 mg 100 mL/hr     . acetaminophen  650 mg Oral Once  . antiseptic oral rinse  7 mL Mouth Rinse BID  . aspirin  324 mg Oral Once  . cefTAZidime (FORTAZ)  IV  2 g Intravenous Q24H  . diphenhydrAMINE  25 mg Intravenous Once  . docusate  100 mg Oral BID  . erythromycin  250 mg Intravenous 3 times per day  . filgrastim  480 mcg Subcutaneous Once  . filgrastim  480 mcg Subcutaneous Daily  . insulin aspart  1-3 Units Subcutaneous Q6H  . lactulose  30 g Oral BID  . methylPREDNISolone (SOLU-MEDROL) injection  60 mg Intravenous Once  . metronidazole  500 mg Intravenous Q8H  . scopolamine  1 patch Transdermal Q72H  . sennosides  10 mL Oral BID  . sodium chloride  3 mL Intravenous Q12H  . thiamine IV  100 mg Intravenous Daily  . Vancomycin  750 mg Intravenous Q24H  . ziprasidone  10 mg Intramuscular QHS    Objective: Vital signs in last 24 hours: Temp:  [97.9 F (36.6 C)-98.6 F (37 C)] 98.6 F (37 C) (06/27 1200) Pulse  Rate:  [88-116] 114 (06/27 1400) Resp:  [15-34] 29 (06/27 1400) BP: (125-152)/(72-108) 134/86 mmHg (06/27 1400) SpO2:  [100 %] 100 % (06/27 1400) Weight:  [64.4 kg (141 lb 15.6 oz)] 64.4 kg (141 lb 15.6 oz) (06/27 0606) General appearance: cachectic Head: Normocephalic, without obvious abnormality, atraumatic Eyes: conjunctivae/corneas clear. PERRL, EOM's intact. Fundi benign. Throat: lips, mucosa, and tongue normal; teeth and gums normal Resp: clear to auscultation bilaterally Cardio: regular rate and rhythm, S1, S2 normal, no murmur, click, rub or gallop GI: soft, non-tender; bowel sounds normal; no masses,  no organomegaly Extremities: extremities normal, atraumatic, no cyanosis or edema Skin: Skin color, texture, turgor normal. No rashes or lesions  Lab Results  Recent Labs  12/08/14 0530 12/08/14 1526 12/09/14 0509  WBC 0.6* 0.9*  --   HGB 8.5* 7.9*  --   HCT 25.2* 23.7*  --   NA 155*  --  153*  K 3.9  --  3.1*  CL 126*   --  125*  CO2 20*  --  22  BUN 64*  --  53*  CREATININE 2.63*  --  2.25*    Microbiology: Results for orders placed or performed during the hospital encounter of 11/29/14  MRSA PCR Screening     Status: None   Collection Time: 11/29/14 12:13 PM  Result Value Ref Range Status   MRSA by PCR NEGATIVE NEGATIVE Final    Comment:        The GeneXpert MRSA Assay (FDA approved for NASAL specimens only), is one component of a comprehensive MRSA colonization surveillance program. It is not intended to diagnose MRSA infection nor to guide or monitor treatment for MRSA infections.   Culture, blood (routine x 2)     Status: None   Collection Time: 11/29/14  1:19 PM  Result Value Ref Range Status   Specimen Description BLOOD  Final   Special Requests Immunocompromised  Final   Culture NO GROWTH 5 DAYS  Final   Report Status 12/04/2014 FINAL  Final  Urine culture     Status: None   Collection Time: 11/30/14 10:12 AM  Result Value Ref Range Status   Specimen Description URINE, CATHETERIZED  Final   Special Requests Immunocompromised  Final   Culture NO GROWTH 2 DAYS  Final   Report Status 12/02/2014 FINAL  Final  Culture, blood (routine x 2)     Status: None (Preliminary result)   Collection Time: 12/06/14  8:08 PM  Result Value Ref Range Status   Specimen Description BLOOD  Final   Special Requests NONE  Final   Culture  Setup Time   Final    GRAM POSITIVE COCCI IN BOTH AEROBIC AND ANAEROBIC BOTTLES CRITICAL RESULT CALLED TO, READ BACK BY AND VERIFIED WITH: JENNIFER BEZARD AT 7989 12/08/14.PMH CONFIRMED BY [facility]    Culture   Final    GRAM POSITIVE COCCI IN BOTH AEROBIC AND ANAEROBIC BOTTLES IDENTIFICATION TO FOLLOW    Report Status PENDING  Incomplete  Culture, blood (routine x 2)     Status: None (Preliminary result)   Collection Time: 12/06/14  8:40 PM  Result Value Ref Range Status   Specimen Description BLOOD  Final   Special Requests NONE  Final   Culture NO GROWTH 3  DAYS  Final   Report Status PENDING  Incomplete  Culture, blood (routine x 2)     Status: None (Preliminary result)   Collection Time: 12/08/14 11:40 AM  Result Value Ref Range Status  Specimen Description BLOOD  Final   Special Requests Normal  Final   Culture NO GROWTH < 24 HOURS  Final   Report Status PENDING  Incomplete  Culture, blood (routine x 2)     Status: None (Preliminary result)   Collection Time: 12/08/14 11:49 AM  Result Value Ref Range Status   Specimen Description BLOOD  Final   Special Requests Normal  Final   Culture NO GROWTH < 24 HOURS  Final   Report Status PENDING  Incomplete    Studies/Results: Dg Abd 1 View  12/09/2014   CLINICAL DATA:  Ileus.  EXAM: ABDOMEN - 1 VIEW  COMPARISON:  12/08/2014  FINDINGS: Left ureteral stent remains in place, unchanged. Oral contrast material in the rectosigmoid colon, stable. NG tube in stable position with the tip in the distal stomach.  Mild gaseous distension of the right colon and transverse colon, similar to prior study. No new bowel dilatation. No free air organomegaly.  IMPRESSION: Stable exam.   Electronically Signed   By: Rolm Baptise M.D.   On: 12/09/2014 13:08   Dg Abd Portable 1v  12/08/2014   CLINICAL DATA:  Ileus.  EXAM: PORTABLE ABDOMEN - 1 VIEW  COMPARISON:  12/07/2014  FINDINGS: There is a left-sided nephro ureteral stent in place. Persistent enteric contrast material within the rectum. Colonic ileus is stable to improved from the previous exam.  IMPRESSION: 1. Stable to improved appearance of colonic ileus.   Electronically Signed   By: Kerby Moors M.D.   On: 12/08/2014 08:58   Echo - Left ventricle: The cavity size was moderately dilated. There was mild concentric hypertrophy. Systolic function was moderately to severely reduced. The estimated ejection fraction was 35%. Diffuse hypokinesis. Doppler parameters are consistent with abnormal left ventricular relaxation (grade 1  diastolic dysfunction). - Aortic valve: There was severe regurgitation. Valve area (Vmax): 3.45 cm^2. - Mitral valve: There was moderate regurgitation. - Atrial septum: No defect or patent foramen ovale was identified. - Tricuspid valve: There was moderate regurgitation. Impressions: - severe LV dysfunction with left radical ejection fraction approximately 73% and mild diastolic dysfunction. There is severe aortic regurgitation and moderate mitral regurgitation advise cardiac evaluation.  Assessment/Plan: Caleb Francis is a 69 y.o. male with mantle cell lymphoma undergoing chemo admitted 6/15 from oncology clinic with AMS, chest pain. He was found to have a UTI with U CX + and bacteremia with Klebsiella oxytoca and had been on ceftriaxone until 6/24 when he developed worsening confusion, chills and hemodynamic instability at time of platelet transfusion. He has been neutropenic as well and is getting GCSF with his chemo. He also has profound metabolic derangement with hypernatremia, ARF. He may also have an ileus and has NGT in place. Bcx are from 6/24 now with GPC in 1 of 2 yet to be finalized. FU [facility] 6/26 NGTD Remains neutropenic, and with TCP.   Recommendations To cover the Klebsiella oxytoca and cover for neutropenia would continue ceftazidime.  GPC bacteremia 6/24 -  repeat bcx 6/26 ngtd Echo neg for Endocarditis but has sever CHF   Cont vanco flagyl for the bacteremia at this point  -If  Staph aureus will need portacath removed Cont supportive care Thank you very much for the consult. Will follow with you.  [place], [place]   12/09/2014, 2:49 PM

## 2014-12-09 NOTE — Progress Notes (Signed)
[facility] Hematology/Oncology Progress Note  Date of admission: 11/29/2014  Hospital day:  12/09/2014  Chief Complaint: Caleb Francis is a 69 y.o. 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  Subjective: The patient is arousable, but easily drifts back to sleep.  At times agitated, but calms quickly.  Answers yes/no questions.  Social History: The patient is accompanied by his son today.  I was able to talk to his daughter on the patient's son's cell phone today.  Allergies: No Known Allergies  Scheduled Medications: . sodium chloride  250 mL Intravenous Once  . acetaminophen  650 mg Oral Once  . acetaminophen  650 mg Oral Once  . antiseptic oral rinse  7 mL Mouth Rinse BID  . aspirin  324 mg Oral Once  . cefTAZidime (FORTAZ)  IV  2 g Intravenous Q24H  . diphenhydrAMINE  25 mg Intravenous Once  . diphenhydrAMINE  25 mg Intravenous Once  . docusate  100 mg Oral BID  . erythromycin  250 mg Intravenous 3 times per day  . filgrastim  480 mcg Subcutaneous Once  . filgrastim  480 mcg Subcutaneous Daily  . insulin aspart  1-3 Units Subcutaneous Q6H  . lactulose  30 g Oral BID  . methylPREDNISolone (SOLU-MEDROL) injection  60 mg Intravenous Once  . metronidazole  500 mg Intravenous Q8H  . scopolamine  1 patch Transdermal Q72H  . sennosides  10 mL Oral BID  . sodium chloride  3 mL Intravenous Q12H  . thiamine IV  100 mg Intravenous Daily  . Vancomycin  750 mg Intravenous Q24H  . ziprasidone  10 mg Intramuscular QHS   Review of Systems: GENERAL: Slightly less interactive. Drifts back to sleep.  No fevers or sweats. PERFORMANCE STATUS (ECOG): 4 Lungs: Denies shortness of breath. Cardiac: Denies chest pain. GI: Denis any abdominal pain/discomfort. Musculoskeletal: No back pain. No joint pain. No muscle tenderness. Extremities: No pain or  swelling. Pain: Denies pain.  Physical Exam: Blood pressure 141/82, pulse 117, temperature 97.9 F (36.6 C), temperature source Axillary, resp. rate 26, height 6' (1.829 m), weight 141 lb 15.6 oz (64.4 kg), SpO2 100 %.  GENERAL: Chronically ill appearing gentleman lying in bed in the ICU. MENTAL STATUS:Open eyes with prompting, others drifts back to sleep. Answers questions with single word (yes/no). HEAD: Thin short graying hair. Normocephalic, atraumatic, face symmetric, no Cushingoid features. EYES: Brown eyes. Pupils equal round and reactive to light and accomodation. No conjunctivitis or scleral icterus. ENT: NG tube in place. Oropharynx clear. Poor dentition.  RESPIRATORY: No rales, wheezes or rhonchi. CARDIOVASCULAR: Regular rate and rhythm without murmur, rub or gallop. ABDOMEN: Soft, with decreased bowel sounds, and no hepatosplenomegaly. No tenderness. No masses. SKIN: No rashes, ulcers or lesions. EXTREMITIES: No edema, no skin discoloration or tenderness. No palpable cords. NEUROLOGICAL: Less interactive today.  Periods of agitation. Follows commands (squeezes with both hands). PSYCH: Appropriate.  Results for orders placed or performed during the hospital encounter of 11/29/14 (from the past 48 hour(s))  Glucose, capillary     Status: Abnormal   Collection Time: 12/07/14  6:21 PM  Result Value Ref Range   Glucose-Capillary 125 (H) 65 - 99 mg/dL  CBC     Status: Abnormal   Collection Time: 12/07/14  9:29 PM  Result Value Ref Range   WBC 0.7 (LL) 3.8 - 10.6 K/uL    Comment:  RESULT REPEATED AND VERIFIED CRITICAL VALUE NOTED.  VALUE IS CONSISTENT WITH PREVIOUSLY REPORTED AND CALLED VALUE. PREVIOUSLY CALLED AT 0530 12/06/14.PMH    RBC 3.01 (L) 4.40 - 5.90 MIL/uL   Hemoglobin 8.5 (L) 13.0 - 18.0 g/dL   HCT 25.3 (L) 40.0 - 52.0 %   MCV 84.2 80.0 - 100.0 fL   MCH 28.1 26.0 - 34.0 pg   MCHC 33.4 32.0 - 36.0 g/dL   RDW 17.2 (H) 11.5 - 14.5 %   Platelets  17 (LL) 150 - 440 K/uL    Comment: RESULT REPEATED AND VERIFIED CRITICAL VALUE NOTED.  VALUE IS CONSISTENT WITH PREVIOUSLY REPORTED AND CALLED VALUE. PREVIOUSLY CALLED AT 0530 12/06/14.PMH PLATELET COUNT CONFIRMED BY SMEAR   Protime-INR     Status: Abnormal   Collection Time: 12/07/14  9:29 PM  Result Value Ref Range   Prothrombin Time 16.2 (H) 11.4 - 15.0 seconds   INR 1.28   APTT     Status: None   Collection Time: 12/07/14  9:29 PM  Result Value Ref Range   aPTT 29 24 - 36 seconds  Fibrinogen     Status: Abnormal   Collection Time: 12/07/14  9:29 PM  Result Value Ref Range   Fibrinogen 612 (H) 210 - 470 mg/dL  Glucose, capillary     Status: Abnormal   Collection Time: 12/08/14 12:15 AM  Result Value Ref Range   Glucose-Capillary 152 (H) 65 - 99 mg/dL  Basic metabolic panel     Status: Abnormal   Collection Time: 12/08/14  5:30 AM  Result Value Ref Range   Sodium 155 (H) 135 - 145 mmol/L   Potassium 3.9 3.5 - 5.1 mmol/L   Chloride 126 (H) 101 - 111 mmol/L   CO2 20 (L) 22 - 32 mmol/L   Glucose, Bld 184 (H) 65 - 99 mg/dL   BUN 64 (H) 6 - 20 mg/dL   Creatinine, Ser 2.63 (H) 0.61 - 1.24 mg/dL   Calcium 8.5 (L) 8.9 - 10.3 mg/dL   GFR calc non Af Amer 24 (L) >60 mL/min   GFR calc Af Amer 27 (L) >60 mL/min    Comment: (NOTE) The eGFR has been calculated using the CKD EPI equation. This calculation has not been validated in all clinical situations. eGFR's persistently <60 mL/min signify possible Chronic Kidney Disease.    Anion gap 9 5 - 15  Phosphorus     Status: Abnormal   Collection Time: 12/08/14  5:30 AM  Result Value Ref Range   Phosphorus 6.0 (H) 2.5 - 4.6 mg/dL  Magnesium     Status: None   Collection Time: 12/08/14  5:30 AM  Result Value Ref Range   Magnesium 2.4 1.7 - 2.4 mg/dL  CBC     Status: Abnormal   Collection Time: 12/08/14  5:30 AM  Result Value Ref Range   WBC 0.6 (LL) 3.8 - 10.6 K/uL    Comment: RESULT REPEATED AND VERIFIED CRITICAL VALUE NOTED.   VALUE IS CONSISTENT WITH PREVIOUSLY REPORTED AND CALLED VALUE. PREVIOUSLY CALLED AT 0530 12/06/14.PMH    RBC 2.99 (L) 4.40 - 5.90 MIL/uL   Hemoglobin 8.5 (L) 13.0 - 18.0 g/dL   HCT 25.2 (L) 40.0 - 52.0 %   MCV 84.2 80.0 - 100.0 fL   MCH 28.5 26.0 - 34.0 pg   MCHC 33.9 32.0 - 36.0 g/dL   RDW 18.6 (H) 11.5 - 14.5 %   Platelets 34 (L) 150 - 440 K/uL  Glucose, capillary  Status: Abnormal   Collection Time: 12/08/14  5:40 AM  Result Value Ref Range   Glucose-Capillary 179 (H) 65 - 99 mg/dL  Glucose, capillary     Status: Abnormal   Collection Time: 12/08/14 11:09 AM  Result Value Ref Range   Glucose-Capillary 201 (H) 65 - 99 mg/dL  Culture, blood (routine x 2)     Status: None (Preliminary result)   Collection Time: 12/08/14 11:40 AM  Result Value Ref Range   Specimen Description BLOOD    Special Requests Normal    Culture NO GROWTH < 24 HOURS    Report Status PENDING   Culture, blood (routine x 2)     Status: None (Preliminary result)   Collection Time: 12/08/14 11:49 AM  Result Value Ref Range   Specimen Description BLOOD    Special Requests Normal    Culture NO GROWTH < 24 HOURS    Report Status PENDING   CBC     Status: Abnormal   Collection Time: 12/08/14  3:26 PM  Result Value Ref Range   WBC 0.9 (LL) 3.8 - 10.6 K/uL    Comment: CRITICAL VALUE NOTED.  VALUE IS CONSISTENT WITH PREVIOUSLY REPORTED AND CALLED VALUE. RESULT REPEATED AND VERIFIED    RBC 2.81 (L) 4.40 - 5.90 MIL/uL   Hemoglobin 7.9 (L) 13.0 - 18.0 g/dL   HCT 23.7 (L) 40.0 - 52.0 %   MCV 84.2 80.0 - 100.0 fL   MCH 28.2 26.0 - 34.0 pg   MCHC 33.4 32.0 - 36.0 g/dL   RDW 18.1 (H) 11.5 - 14.5 %   Platelets 16 (LL) 150 - 440 K/uL    Comment: CRITICAL VALUE NOTED.  VALUE IS CONSISTENT WITH PREVIOUSLY REPORTED AND CALLED VALUE. RESULT REPEATED AND VERIFIED PLATELET COUNT CONFIRMED BY SMEAR   Glucose, capillary     Status: Abnormal   Collection Time: 12/08/14  5:06 PM  Result Value Ref Range    Glucose-Capillary 153 (H) 65 - 99 mg/dL  Glucose, capillary     Status: Abnormal   Collection Time: 12/08/14  9:15 PM  Result Value Ref Range   Glucose-Capillary 149 (H) 65 - 99 mg/dL  Glucose, capillary     Status: Abnormal   Collection Time: 12/08/14 11:52 PM  Result Value Ref Range   Glucose-Capillary 198 (H) 65 - 99 mg/dL  Basic metabolic panel     Status: Abnormal   Collection Time: 12/09/14  5:09 AM  Result Value Ref Range   Sodium 153 (H) 135 - 145 mmol/L   Potassium 3.1 (L) 3.5 - 5.1 mmol/L   Chloride 125 (H) 101 - 111 mmol/L   CO2 22 22 - 32 mmol/L   Glucose, Bld 131 (H) 65 - 99 mg/dL   BUN 53 (H) 6 - 20 mg/dL   Creatinine, Ser 2.25 (H) 0.61 - 1.24 mg/dL   Calcium 8.5 (L) 8.9 - 10.3 mg/dL   GFR calc non Af Amer 28 (L) >60 mL/min   GFR calc Af Amer 33 (L) >60 mL/min    Comment: (NOTE) The eGFR has been calculated using the CKD EPI equation. This calculation has not been validated in all clinical situations. eGFR's persistently <60 mL/min signify possible Chronic Kidney Disease.    Anion gap 6 5 - 15  Phosphorus     Status: None   Collection Time: 12/09/14  5:09 AM  Result Value Ref Range   Phosphorus 3.2 2.5 - 4.6 mg/dL  Magnesium     Status: None  Collection Time: 12/09/14  5:09 AM  Result Value Ref Range   Magnesium 2.1 1.7 - 2.4 mg/dL  Glucose, capillary     Status: Abnormal   Collection Time: 12/09/14  5:25 AM  Result Value Ref Range   Glucose-Capillary 112 (H) 65 - 99 mg/dL  Vancomycin, trough     Status: None   Collection Time: 12/09/14  8:19 AM  Result Value Ref Range   Vancomycin Tr 17 10 - 20 ug/mL  Osmolality, urine     Status: None   Collection Time: 12/09/14 12:10 PM  Result Value Ref Range   Osmolality, Ur 317 300 - 900 mOsm/kg  Glucose, capillary     Status: Abnormal   Collection Time: 12/09/14 12:23 PM  Result Value Ref Range   Glucose-Capillary 150 (H) 65 - 99 mg/dL  CBC     Status: Abnormal   Collection Time: 12/09/14 12:42 PM  Result  Value Ref Range   WBC 1.3 (LL) 3.8 - 10.6 K/uL    Comment: CRITICAL VALUE NOTED.  VALUE IS CONSISTENT WITH PREVIOUSLY REPORTED AND CALLED VALUE.   RBC 3.23 (L) 4.40 - 5.90 MIL/uL   Hemoglobin 9.3 (L) 13.0 - 18.0 g/dL   HCT 27.8 (L) 40.0 - 52.0 %   MCV 85.9 80.0 - 100.0 fL   MCH 28.7 26.0 - 34.0 pg   MCHC 33.4 32.0 - 36.0 g/dL   RDW 17.9 (H) 11.5 - 14.5 %   Platelets 7 (LL) 150 - 440 K/uL    Comment: CRITICAL VALUE NOTED.  VALUE IS CONSISTENT WITH PREVIOUSLY REPORTED AND CALLED VALUE. RESULT REPEATED AND VERIFIED   Potassium     Status: None   Collection Time: 12/09/14 12:42 PM  Result Value Ref Range   Potassium 3.6 3.5 - 5.1 mmol/L   Dg Abd 1 View  12/09/2014   CLINICAL DATA:  Ileus.  EXAM: ABDOMEN - 1 VIEW  COMPARISON:  12/08/2014  FINDINGS: Left ureteral stent remains in place, unchanged. Oral contrast material in the rectosigmoid colon, stable. NG tube in stable position with the tip in the distal stomach.  Mild gaseous distension of the right colon and transverse colon, similar to prior study. No new bowel dilatation. No free air organomegaly.  IMPRESSION: Stable exam.   Electronically Signed   By: Rolm Baptise M.D.   On: 12/09/2014 13:08   Dg Abd Portable 1v  12/08/2014   CLINICAL DATA:  Ileus.  EXAM: PORTABLE ABDOMEN - 1 VIEW  COMPARISON:  12/07/2014  FINDINGS: There is a left-sided nephro ureteral stent in place. Persistent enteric contrast material within the rectum. Colonic ileus is stable to improved from the previous exam.  IMPRESSION: 1. Stable to improved appearance of colonic ileus.   Electronically Signed   By: Kerby Moors M.D.   On: 12/08/2014 08:58    Assessment:  JASN XIA is a 68 y.o. male with relapsed cell lymphoma currently day 13 status post cycle #2 RICE chemotherapy. He was admitted with Klebsiella sepsis due to pyelonephritis and associated altered mental status. He subsequently developed renal insufficiency with assciated hypernatremia. He remains  in the ICU. He has some component of ICU psychosis.  He had a platelet transfusion reaction on 12/06/2014. Blood cultures from 12/06/2014 are growing GPC in 1 of 2 bottles anaerobic bottles (no ID yet). He had an upper GI bleed on 12/07/2014. Bleeding has stopped. Hematocrit has stabilized. Platelets remain low.  WBC is slowly improving.  He has an ileus. He is less alert  and interactive today.  Plan: 1. Hematology/Oncology: Day 13 s/p chemotherapy. WBC is improving.  Patient remains neutropenic. Continue daily GCSF until [place] > 5000. Platelet transfusion today.  Premed all blood products with Tylenol and Benadryl. 2. Infectious disease: Appreciate ID consult. On cetazidime, vancomycin, and Flagyl.  Await ID of GPC in 1 of 2 bottles anaerobic bottles. No further positive blood cultures. 3. Nephrology: Hypernatremia and renal insufficiency. D5W continues. 4. Neurology: Appreciate neurology consult.  Mental status waxing and waning. Patient with metabolic encephalopathy and ICU psychosis. 5. Gastroenterology: UGI bleed resolved. Ileus. On stool softeners and lactulose. Hopefully will be able to begin po intake soon. Anticipate speech evaluation of swallowing prior to eating. No suppositories or enemas secondary to neutropenia. 6. Disposition: Remains guarded. DNR code status.  Lequita Asal, MD  12/09/2014, 4:38 PM

## 2014-12-09 NOTE — Progress Notes (Signed)
   12/09/14 1300  Clinical Encounter Type  Visited With Patient and family together;Health care provider  Visit Type Follow-up  Consult/Referral To Chaplain  Spiritual Encounters  Spiritual Needs Emotional  Stress Factors  Family Stress Factors Health changes  Chaplain rounded in unit and offered spiritiual support and a compassionate presence to patient and family. Chaplain Delos Klich A. Shareta Fishbaugh Ext. [phone]

## 2014-12-09 NOTE — Progress Notes (Signed)
I called blood bank regarding platelets and they will call units once platelets are available.

## 2014-12-09 NOTE — Progress Notes (Signed)
Called Dr. Mike Gip with results of CBC and she will order platelets

## 2014-12-09 NOTE — Progress Notes (Signed)
[place] NOTE  Pharmacy Consult for Electrolyte Management and Constipation Management Indication: ICU Status   No Known Allergies  Patient Measurements: Height: 6' (182.9 cm) Weight: 141 lb 15.6 oz (64.4 kg) IBW/kg (Calculated) : 77.6   Vital Signs: Temp: 98.6 F (37 C) (06/27 1200) Temp Source: Axillary (06/27 0800) BP: 134/86 mmHg (06/27 1400) Pulse Rate: 114 (06/27 1400) Intake/Output from previous day: 06/26 0701 - 06/27 0700 In: 6065 [I.V.:4965; Blood:700; IV Piggyback:400] Out: 3750 [Urine:3750] Intake/Output from this shift: Total I/O In: 2400 [I.V.:1400; IV Piggyback:1000] Out: 2575 [Urine:2575] Vent settings for last 24 hours:    Labs:  Recent Labs  12/07/14 0555  12/07/14 2129 12/08/14 0530 12/08/14 1526 12/09/14 0509  WBC 0.6*  --  0.7* 0.6* 0.9*  --   HGB 7.0*  --  8.5* 8.5* 7.9*  --   HCT 21.4*  --  25.3* 25.2* 23.7*  --   PLT 11*  < > 17* 34* 16*  --   APTT  --   --  29  --   --   --   INR  --   --  1.28  --   --   --   CREATININE 2.57*  --   --  2.63*  --  2.25*  MG 2.3  --   --  2.4  --  2.1  PHOS 4.0  --   --  6.0*  --  3.2  < > = values in this interval not displayed. Estimated Creatinine Clearance: 29 mL/min (by C-G formula based on Cr of 2.25). BMP Latest Ref Rng 12/09/2014 12/09/2014 12/08/2014  Glucose 65 - 99 mg/dL - 131(H) 184(H)  BUN 6 - 20 mg/dL - 53(H) 64(H)  Creatinine 0.61 - 1.24 mg/dL - 2.25(H) 2.63(H)  Sodium 135 - 145 mmol/L - 153(H) 155(H)  Potassium 3.5 - 5.1 mmol/L 3.6 3.1(L) 3.9  Chloride 101 - 111 mmol/L - 125(H) 126(H)  CO2 22 - 32 mmol/L - 22 20(L)  Calcium 8.9 - 10.3 mg/dL - 8.5(L) 8.5(L)     Recent Labs  12/08/14 2352 12/09/14 0525 12/09/14 1223  GLUCAP 198* 112* 150*    Microbiology: Recent Results (from the past 720 hour(s))  Culture, blood (single)     Status: None   Collection Time: 11/29/14 11:09 AM  Result Value Ref Range Status   Specimen Description BLOOD  Final   Special Requests NONE  Final   Culture  Setup Time   Final    GRAM NEGATIVE RODS AEROBIC BOTTLE ONLY CRITICAL RESULT CALLED TO, READ BACK BY AND VERIFIED WITH: SANDRA VORBA ON 11/30/14 AT 0825 BY JEF    Culture KLEBSIELLA OXYTOCA AEROBIC BOTTLE ONLY   Final   Report Status 12/04/2014 FINAL  Final   Organism ID, Bacteria KLEBSIELLA OXYTOCA  Final      Susceptibility   Klebsiella oxytoca - MIC*    AMPICILLIN >=32 RESISTANT Resistant     CEFTAZIDIME <=1 SENSITIVE Sensitive     CEFAZOLIN >=64 RESISTANT Resistant     CEFTRIAXONE <=1 SENSITIVE Sensitive     CIPROFLOXACIN <=0.25 SENSITIVE Sensitive     GENTAMICIN <=1 SENSITIVE Sensitive     IMIPENEM <=0.25 SENSITIVE Sensitive     TRIMETH/SULFA <=20 SENSITIVE Sensitive     CEFOXITIN <=4 SENSITIVE Sensitive     PIP/TAZO Value in next row Resistant      RESISTANT128    * KLEBSIELLA OXYTOCA  Urine culture     Status: None   Collection  Time: 11/29/14 11:22 AM  Result Value Ref Range Status   Specimen Description URINE, CLEAN CATCH  Final   Special Requests NONE  Final   Culture >=100,000 COLONIES/mL KLEBSIELLA OXYTOCA  Final   Report Status 12/01/2014 FINAL  Final   Organism ID, Bacteria KLEBSIELLA OXYTOCA  Final      Susceptibility   Klebsiella oxytoca - MIC*    AMPICILLIN >=32 RESISTANT Resistant     CEFAZOLIN >=64 RESISTANT Resistant     CEFTRIAXONE 4 SENSITIVE Sensitive     CIPROFLOXACIN <=0.25 SENSITIVE Sensitive     GENTAMICIN <=1 SENSITIVE Sensitive     IMIPENEM <=0.25 SENSITIVE Sensitive     NITROFURANTOIN <=16 SENSITIVE Sensitive     TRIMETH/SULFA <=20 SENSITIVE Sensitive     CEFOXITIN <=4 SENSITIVE Sensitive     * >=100,000 COLONIES/mL KLEBSIELLA OXYTOCA  MRSA PCR Screening     Status: None   Collection Time: 11/29/14 12:13 PM  Result Value Ref Range Status   MRSA by PCR NEGATIVE NEGATIVE Final    Comment:        The GeneXpert MRSA Assay (FDA approved for NASAL specimens only), is one component of a comprehensive  MRSA colonization surveillance program. It is not intended to diagnose MRSA infection nor to guide or monitor treatment for MRSA infections.   Culture, blood (routine x 2)     Status: None   Collection Time: 11/29/14  1:19 PM  Result Value Ref Range Status   Specimen Description BLOOD  Final   Special Requests Immunocompromised  Final   Culture NO GROWTH 5 DAYS  Final   Report Status 12/04/2014 FINAL  Final  Urine culture     Status: None   Collection Time: 11/30/14 10:12 AM  Result Value Ref Range Status   Specimen Description URINE, CATHETERIZED  Final   Special Requests Immunocompromised  Final   Culture NO GROWTH 2 DAYS  Final   Report Status 12/02/2014 FINAL  Final  Culture, blood (routine x 2)     Status: None (Preliminary result)   Collection Time: 12/06/14  8:08 PM  Result Value Ref Range Status   Specimen Description BLOOD  Final   Special Requests NONE  Final   Culture  Setup Time   Final    GRAM POSITIVE COCCI IN BOTH AEROBIC AND ANAEROBIC BOTTLES CRITICAL RESULT CALLED TO, READ BACK BY AND VERIFIED WITH: JENNIFER BEZARD AT 2440 12/08/14.PMH CONFIRMED BY RWW    Culture   Final    GRAM POSITIVE COCCI IN BOTH AEROBIC AND ANAEROBIC BOTTLES IDENTIFICATION TO FOLLOW    Report Status PENDING  Incomplete  Culture, blood (routine x 2)     Status: None (Preliminary result)   Collection Time: 12/06/14  8:40 PM  Result Value Ref Range Status   Specimen Description BLOOD  Final   Special Requests NONE  Final   Culture NO GROWTH 3 DAYS  Final   Report Status PENDING  Incomplete  Culture, blood (routine x 2)     Status: None (Preliminary result)   Collection Time: 12/08/14 11:40 AM  Result Value Ref Range Status   Specimen Description BLOOD  Final   Special Requests Normal  Final   Culture NO GROWTH < 24 HOURS  Final   Report Status PENDING  Incomplete  Culture, blood (routine x 2)     Status: None (Preliminary result)   Collection Time: 12/08/14 11:49 AM  Result  Value Ref Range Status   Specimen Description BLOOD  Final  Special Requests Normal  Final   Culture NO GROWTH < 24 HOURS  Final   Report Status PENDING  Incomplete    Medications:  Scheduled:  . acetaminophen  650 mg Oral Once  . antiseptic oral rinse  7 mL Mouth Rinse BID  . aspirin  324 mg Oral Once  . cefTAZidime (FORTAZ)  IV  2 g Intravenous Q24H  . diphenhydrAMINE  25 mg Intravenous Once  . docusate  100 mg Oral BID  . erythromycin  250 mg Intravenous 3 times per day  . filgrastim  480 mcg Subcutaneous Once  . filgrastim  480 mcg Subcutaneous Daily  . insulin aspart  1-3 Units Subcutaneous Q6H  . lactulose  30 g Oral BID  . methylPREDNISolone (SOLU-MEDROL) injection  60 mg Intravenous Once  . metronidazole  500 mg Intravenous Q8H  . scopolamine  1 patch Transdermal Q72H  . sennosides  10 mL Oral BID  . sodium chloride  3 mL Intravenous Q12H  . thiamine IV  100 mg Intravenous Daily  . Vancomycin  750 mg Intravenous Q24H  . ziprasidone  10 mg Intramuscular QHS   Infusions:  . dextrose 5 % with KCl 20 mEq / L 20 mEq (12/09/14 1357)  . feeding supplement (JEVITY 1.5 CAL/FIBER) 1,000 mL (12/06/14 1044)  . free water    . norepinephrine 4 mg (12/06/14 2017)    Assessment: 69 yo male with progressive mantle cell lymphoma. Patient being treated for electrolyte imbalance and AMS. Patient currently on vancomycin, metronidazole, and ceftazidime for bacteremia.   Goal of Therapy:  Resolution of Symptoms  Plan:   1. Electrolytes: Patient now receiving D5W with 70mEq of potassium/Liter at 234mL/hr. Will order additional potassium chloride 38mEq IV x 1. Will obtain follow-up labs in am.    2. Constipation: No BM charted. Patient currently ordered lactulose 28mL BID by neuro. Will continue docusate to 168mL VT BID and senna to 17.6mg  VT BID. Will start patient on erythromycin 250mg  IV Q8hr.    3. Vancomycin (day 4): Trough 17. Will continue patient on vancomycin 750mg  IV Q24hr  for goal trough of 15-20.    4. Ceftazidime (day 3): Will continue patient on ceftaz 2g IV q24hr.    Pharmacy will continue to monitor and adjust per consult.    Currie Paris Clinical Pharmacist 12/09/2014

## 2014-12-09 NOTE — Progress Notes (Signed)
I called lab because no results in computer for CBC drawn around 12:00 and was told blood was not put in the proper place and not processed. CBC redrawn and sent to lab.

## 2014-12-09 NOTE — Progress Notes (Signed)
Neurology Note  S: Pt more alert now and following some, still receiving transfusions now with no problem  ROS unobtainable secondary to mental status  O: AFVSS NL weight, mild distress Normocephalic, oropharynx clear CTA B, no wheezing Tachycardic still, no murmur No C/C/E  Sleepy but oriented to person, follows a few simple commands, severe dysarthria PERRLA, EOMI, face symmetric Moves all ext equally  A/P: 1. Encephalopathy- trace improvement from the other day; Likely a component of ICU psychosis as well. Pt also has complicating nutritional deficit and electrolyte abnormalities but likely worse due to infections - antibiotics per ID -  Watch for GI bleed with low platelets - continue Geodon 10mg  qHS regardless - continue lactulose 30gm BID - Continue thiamine 100mg  IV daily - PRN Geodon 10mg  q12h PRN agitation - ** Lights on during day and up in chair ** - would start nutrition as soon as tolerated - Will follow

## 2014-12-10 ENCOUNTER — Inpatient Hospital Stay: Payer: Medicare Other

## 2014-12-10 LAB — SODIUM, URINE, RANDOM: Sodium, Ur: 44 mmol/L

## 2014-12-10 LAB — CBC WITH DIFFERENTIAL/PLATELET
BASOS ABS: 0 10*3/uL (ref 0.0–0.1)
Band Neutrophils: 14 % — ABNORMAL HIGH (ref 0–10)
Basophils Relative: 0 % (ref 0–1)
Blasts: 0 %
Eosinophils Absolute: 0 10*3/uL (ref 0.0–0.7)
Eosinophils Relative: 2 % (ref 0–5)
HCT: 26.5 % — ABNORMAL LOW (ref 40.0–52.0)
Hemoglobin: 8.9 g/dL — ABNORMAL LOW (ref 13.0–18.0)
LYMPHS PCT: 45 % (ref 12–46)
Lymphs Abs: 0.9 10*3/uL (ref 0.7–4.0)
MCH: 28.9 pg (ref 26.0–34.0)
MCHC: 33.6 g/dL (ref 32.0–36.0)
MCV: 86.2 fL (ref 80.0–100.0)
MONOS PCT: 5 % (ref 3–12)
Metamyelocytes Relative: 4 %
Monocytes Absolute: 0.1 10*3/uL (ref 0.1–1.0)
Myelocytes: 0 %
Neutro Abs: 0.9 10*3/uL — ABNORMAL LOW (ref 1.7–7.7)
Neutrophils Relative %: 24 % — ABNORMAL LOW (ref 43–77)
Other: 6 %
Platelets: 13 10*3/uL — CL (ref 150–440)
Promyelocytes Absolute: 0 %
RBC: 3.07 MIL/uL — ABNORMAL LOW (ref 4.40–5.90)
RDW: 17.7 % — AB (ref 11.5–14.5)
WBC: 2.1 10*3/uL — ABNORMAL LOW (ref 3.8–10.6)
nRBC: 0 /100 WBC

## 2014-12-10 LAB — PREPARE PLATELET PHERESIS: Unit division: 0

## 2014-12-10 LAB — OSMOLALITY: Osmolality: 319 mOsm/kg — ABNORMAL HIGH (ref 275–300)

## 2014-12-10 LAB — BASIC METABOLIC PANEL
Anion gap: 5 (ref 5–15)
BUN: 41 mg/dL — ABNORMAL HIGH (ref 6–20)
CHLORIDE: 120 mmol/L — AB (ref 101–111)
CO2: 24 mmol/L (ref 22–32)
Calcium: 8.6 mg/dL — ABNORMAL LOW (ref 8.9–10.3)
Creatinine, Ser: 1.92 mg/dL — ABNORMAL HIGH (ref 0.61–1.24)
GFR calc Af Amer: 40 mL/min — ABNORMAL LOW (ref >60)
GFR calc non Af Amer: 34 mL/min — ABNORMAL LOW (ref >60)
GLUCOSE: 132 mg/dL — AB (ref 65–99)
POTASSIUM: 3.2 mmol/L — AB (ref 3.5–5.1)
Sodium: 149 mmol/L — ABNORMAL HIGH (ref 135–145)

## 2014-12-10 LAB — GLUCOSE, CAPILLARY
GLUCOSE-CAPILLARY: 109 mg/dL — AB (ref 65–99)
GLUCOSE-CAPILLARY: 177 mg/dL — AB (ref 65–99)
Glucose-Capillary: 152 mg/dL — ABNORMAL HIGH (ref 65–99)
Glucose-Capillary: 159 mg/dL — ABNORMAL HIGH (ref 65–99)
Glucose-Capillary: 96 mg/dL (ref 65–99)

## 2014-12-10 LAB — POTASSIUM: Potassium: 3.2 mmol/L — ABNORMAL LOW (ref 3.5–5.1)

## 2014-12-10 LAB — MAGNESIUM: MAGNESIUM: 1.9 mg/dL (ref 1.7–2.4)

## 2014-12-10 LAB — OSMOLALITY, URINE: OSMOLALITY UR: 355 mosm/kg — AB (ref 390–1090)

## 2014-12-10 LAB — TSH: TSH: 0.556 u[IU]/mL (ref 0.350–4.500)

## 2014-12-10 LAB — PHOSPHORUS: PHOSPHORUS: 3.1 mg/dL (ref 2.5–4.6)

## 2014-12-10 MED ORDER — SODIUM CHLORIDE 0.9 % IJ SOLN: 3.0000 mL | INTRAMUSCULAR | Status: DC | PRN

## 2014-12-10 MED ORDER — ACETAMINOPHEN 325 MG PO TABS
650.0000 mg | ORAL_TABLET | Freq: Once | ORAL | Status: AC
  Administered 2014-12-10: 650 mg via ORAL
  Filled 2014-12-10: qty 2

## 2014-12-10 MED ORDER — HEPARIN SOD (PORK) LOCK FLUSH 100 UNIT/ML IV SOLN
250.0000 [IU] | INTRAVENOUS | Status: DC | PRN
Start: 2014-12-10 — End: 2014-12-10

## 2014-12-10 MED ORDER — BISACODYL 10 MG RE SUPP: 10.0000 mg | Freq: Once | RECTAL | Status: DC

## 2014-12-10 MED ORDER — DIPHENHYDRAMINE HCL 50 MG/ML IJ SOLN
25.0000 mg | Freq: Once | INTRAMUSCULAR | Status: AC
  Administered 2014-12-10: 25 mg via INTRAVENOUS
  Filled 2014-12-10: qty 1

## 2014-12-10 MED ORDER — MAGNESIUM SULFATE IN D5W 10-5 MG/ML-% IV SOLN
1.0000 g | Freq: Once | INTRAVENOUS | Status: AC
  Administered 2014-12-10: 1 g via INTRAVENOUS
  Filled 2014-12-10: qty 100

## 2014-12-10 MED ORDER — ONDANSETRON HCL 4 MG/2ML IJ SOLN
INTRAMUSCULAR | Status: AC
  Administered 2014-12-10: 4 mg via INTRAVENOUS
  Filled 2014-12-10: qty 2

## 2014-12-10 MED ORDER — HEPARIN SOD (PORK) LOCK FLUSH 100 UNIT/ML IV SOLN
500.0000 [IU] | Freq: Every day | INTRAVENOUS | Status: DC | PRN
  Filled 2014-12-10: qty 5

## 2014-12-10 MED ORDER — SODIUM CHLORIDE 0.9 % IV SOLN
250.0000 mL | Freq: Once | INTRAVENOUS | Status: AC
  Administered 2014-12-10: 250 mL via INTRAVENOUS

## 2014-12-10 MED ORDER — SODIUM CHLORIDE 0.9 % IJ SOLN
10.0000 mL | INTRAMUSCULAR | Status: AC | PRN
  Administered 2014-12-28: 10 mL

## 2014-12-10 MED ORDER — ONDANSETRON HCL 4 MG/2ML IJ SOLN
4.0000 mg | Freq: Four times a day (QID) | INTRAMUSCULAR | Status: DC | PRN
  Administered 2014-12-10 – 2014-12-13 (×4): 4 mg via INTRAVENOUS
  Filled 2014-12-10 (×3): qty 2

## 2014-12-10 MED ORDER — DIPHENHYDRAMINE HCL 25 MG PO CAPS
25.0000 mg | ORAL_CAPSULE | Freq: Once | ORAL | Status: DC
Start: 2014-12-10 — End: 2014-12-10

## 2014-12-10 MED ORDER — FLEET ENEMA 7-19 GM/118ML RE ENEM: 1.0000 | ENEMA | Freq: Once | RECTAL | Status: DC

## 2014-12-10 NOTE — Progress Notes (Signed)
[facility] Hematology/Oncology Progress Note  Date of admission: 11/29/2014  Hospital day:  12/10/2014  Chief Complaint: Caleb Francis is a 69 y.o. 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  Subjective: Somnolent.  Answers "no" to pain.  Difficulty following commands.  Social History: The patient is accompanied by his son today.  Allergies: No Known Allergies  Scheduled Medications: . antiseptic oral rinse  7 mL Mouth Rinse BID  . aspirin  324 mg Oral Once  . cefTAZidime (FORTAZ)  IV  2 g Intravenous Q24H  . erythromycin  250 mg Intravenous 3 times per day  . filgrastim  480 mcg Subcutaneous Once  . filgrastim  480 mcg Subcutaneous Daily  . insulin aspart  1-3 Units Subcutaneous Q6H  . lactulose  30 g Oral BID  . methylPREDNISolone (SOLU-MEDROL) injection  60 mg Intravenous Once  . metronidazole  500 mg Intravenous Q8H  . scopolamine  1 patch Transdermal Q72H  . sennosides  10 mL Oral BID  . thiamine IV  100 mg Intravenous Daily  . Vancomycin  750 mg Intravenous Q24H  . ziprasidone  10 mg Intramuscular QHS   Review of Systems: GENERAL: Poorly interactive. Drifts back to sleep.  No fevers. PERFORMANCE STATUS (ECOG): 4 Pain: Denies pain.  Physical Exam: Blood pressure 81/59, pulse 110, temperature 97.6 F (36.4 C), temperature source Axillary, resp. rate 24, height 6' (1.829 m), weight 145 lb 1 oz (65.8 kg), SpO2 98 %.  GENERAL: Chronically ill appearing gentleman lying in bed in the ICU. MENTAL STATUS:Open eyes with prompting, then drifts back to sleep. Answers "no" to question of pain. HEAD: Thin short graying hair. Normocephalic, atraumatic, face symmetric, no Cushingoid features. EYES: Brown eyes. Pupils equal round and reactive to light and accomodation. No conjunctivitis or scleral icterus. ENT: NG tube in place. Oropharynx  clear. Poor dentition.  RESPIRATORY: Upper airway sounds.  Decreased breath sounds at the bases.  No rales, wheezes or rhonchi. CARDIOVASCULAR: Regular rate and rhythm without murmur, rub or gallop. ABDOMEN: Distended.  Soft, minimal bowel sounds, and no hepatosplenomegaly. No tenderness. No guarding or rebound tenderness.  No masses. SKIN: No rashes, ulcers or lesions. EXTREMITIES: No edema, no skin discoloration or tenderness. No palpable cords. NEUROLOGICAL: Minimally interactive.  Lethargic.  Follows some commands (squeezes with both hands). Right hand grip stronger than left hand.  Does not wiggle toes.  Withdraws to pain.  Results for orders placed or performed during the hospital encounter of 11/29/14 (from the past 48 hour(s))  Glucose, capillary     Status: Abnormal   Collection Time: 12/08/14 11:52 PM  Result Value Ref Range   Glucose-Capillary 198 (H) 65 - 99 mg/dL  Basic metabolic panel     Status: Abnormal   Collection Time: 12/09/14  5:09 AM  Result Value Ref Range   Sodium 153 (H) 135 - 145 mmol/L   Potassium 3.1 (L) 3.5 - 5.1 mmol/L   Chloride 125 (H) 101 - 111 mmol/L   CO2 22 22 - 32 mmol/L   Glucose, Bld 131 (H) 65 - 99 mg/dL   BUN 53 (H) 6 - 20 mg/dL   Creatinine, Ser 2.25 (H) 0.61 - 1.24 mg/dL   Calcium 8.5 (L) 8.9 - 10.3 mg/dL   GFR calc non Af Amer 28 (L) >60 mL/min   GFR calc Af Amer 33 (L) >60 mL/min    Comment: (  NOTE) The eGFR has been calculated using the CKD EPI equation. This calculation has not been validated in all clinical situations. eGFR's persistently <60 mL/min signify possible Chronic Kidney Disease.    Anion gap 6 5 - 15  Phosphorus     Status: None   Collection Time: 12/09/14  5:09 AM  Result Value Ref Range   Phosphorus 3.2 2.5 - 4.6 mg/dL  Magnesium     Status: None   Collection Time: 12/09/14  5:09 AM  Result Value Ref Range   Magnesium 2.1 1.7 - 2.4 mg/dL  Glucose, capillary     Status: Abnormal   Collection Time: 12/09/14   5:25 AM  Result Value Ref Range   Glucose-Capillary 112 (H) 65 - 99 mg/dL  Vancomycin, trough     Status: None   Collection Time: 12/09/14  8:19 AM  Result Value Ref Range   Vancomycin Tr 17 10 - 20 ug/mL  Osmolality, urine     Status: None   Collection Time: 12/09/14 12:10 PM  Result Value Ref Range   Osmolality, Ur 317 300 - 900 mOsm/kg  Glucose, capillary     Status: Abnormal   Collection Time: 12/09/14 12:23 PM  Result Value Ref Range   Glucose-Capillary 150 (H) 65 - 99 mg/dL  CBC     Status: Abnormal   Collection Time: 12/09/14 12:42 PM  Result Value Ref Range   WBC 1.3 (LL) 3.8 - 10.6 K/uL    Comment: CRITICAL VALUE NOTED.  VALUE IS CONSISTENT WITH PREVIOUSLY REPORTED AND CALLED VALUE.   RBC 3.23 (L) 4.40 - 5.90 MIL/uL   Hemoglobin 9.3 (L) 13.0 - 18.0 g/dL   HCT 27.8 (L) 40.0 - 52.0 %   MCV 85.9 80.0 - 100.0 fL   MCH 28.7 26.0 - 34.0 pg   MCHC 33.4 32.0 - 36.0 g/dL   RDW 17.9 (H) 11.5 - 14.5 %   Platelets 7 (LL) 150 - 440 K/uL    Comment: CRITICAL VALUE NOTED.  VALUE IS CONSISTENT WITH PREVIOUSLY REPORTED AND CALLED VALUE. RESULT REPEATED AND VERIFIED   Potassium     Status: None   Collection Time: 12/09/14 12:42 PM  Result Value Ref Range   Potassium 3.6 3.5 - 5.1 mmol/L  Prepare Pheresed Platelets     Status: None   Collection Time: 12/09/14  5:15 PM  Result Value Ref Range   Unit Number [id number]    Blood Component Type PLTPHER LRI1    Unit division 00    Status of Unit ISSUED,FINAL    Transfusion Status OK TO TRANSFUSE   Glucose, capillary     Status: Abnormal   Collection Time: 12/09/14  6:23 PM  Result Value Ref Range   Glucose-Capillary 144 (H) 65 - 99 mg/dL  Glucose, capillary     Status: Abnormal   Collection Time: 12/09/14 11:55 PM  Result Value Ref Range   Glucose-Capillary 159 (H) 65 - 99 mg/dL  Basic metabolic panel     Status: Abnormal   Collection Time: 12/10/14  5:00 AM  Result Value Ref Range   Sodium 149 (H) 135 - 145 mmol/L    Potassium 3.2 (L) 3.5 - 5.1 mmol/L   Chloride 120 (H) 101 - 111 mmol/L   CO2 24 22 - 32 mmol/L   Glucose, Bld 132 (H) 65 - 99 mg/dL   BUN 41 (H) 6 - 20 mg/dL   Creatinine, Ser 1.92 (H) 0.61 - 1.24 mg/dL   Calcium 8.6 (L) 8.9 - 10.3  mg/dL   GFR calc non Af Amer 34 (L) >60 mL/min   GFR calc Af Amer 40 (L) >60 mL/min    Comment: (NOTE) The eGFR has been calculated using the CKD EPI equation. This calculation has not been validated in all clinical situations. eGFR's persistently <60 mL/min signify possible Chronic Kidney Disease.    Anion gap 5 5 - 15  CBC with Differential     Status: Abnormal   Collection Time: 12/10/14  5:00 AM  Result Value Ref Range   WBC 2.1 (L) 3.8 - 10.6 K/uL   RBC 3.07 (L) 4.40 - 5.90 MIL/uL   Hemoglobin 8.9 (L) 13.0 - 18.0 g/dL   HCT 26.5 (L) 40.0 - 52.0 %   MCV 86.2 80.0 - 100.0 fL   MCH 28.9 26.0 - 34.0 pg   MCHC 33.6 32.0 - 36.0 g/dL   RDW 17.7 (H) 11.5 - 14.5 %   Platelets 13 (LL) 150 - 440 K/uL    Comment: CRITICAL VALUE NOTED.  VALUE IS CONSISTENT WITH PREVIOUSLY REPORTED AND CALLED VALUE.   Neutrophils Relative % 24 (L) 43 - 77 %   Lymphocytes Relative 45 12 - 46 %   Monocytes Relative 5 3 - 12 %   Eosinophils Relative 2 0 - 5 %   Basophils Relative 0 0 - 1 %   Band Neutrophils 14 (H) 0 - 10 %   Metamyelocytes Relative 4 %   Myelocytes 0 %   Promyelocytes Absolute 0 %   Blasts 0 %   nRBC 0 0 /100 WBC   Other 6 %   Neutro Abs 0.9 (L) 1.7 - 7.7 K/uL   Lymphs Abs 0.9 0.7 - 4.0 K/uL   Monocytes Absolute 0.1 0.1 - 1.0 K/uL   Eosinophils Absolute 0.0 0.0 - 0.7 K/uL   Basophils Absolute 0.0 0.0 - 0.1 K/uL   WBC Morphology DOHLE BODIES     Comment: TOXIC GRANULATION   Smear Review LARGE PLATELETS PRESENT     Comment: PLATELETS APPEAR DECREASED  Magnesium     Status: None   Collection Time: 12/10/14  5:00 AM  Result Value Ref Range   Magnesium 1.9 1.7 - 2.4 mg/dL  Phosphorus     Status: None   Collection Time: 12/10/14  5:00 AM  Result  Value Ref Range   Phosphorus 3.1 2.5 - 4.6 mg/dL  Glucose, capillary     Status: Abnormal   Collection Time: 12/10/14  5:55 AM  Result Value Ref Range   Glucose-Capillary 109 (H) 65 - 99 mg/dL  Osmolality, urine     Status: Abnormal   Collection Time: 12/10/14  9:16 AM  Result Value Ref Range   Osmolality, Ur 355 (L) 390 - 1090 mOsm/kg  Sodium, urine, random     Status: None   Collection Time: 12/10/14  9:16 AM  Result Value Ref Range   Sodium, Ur 44 mmol/L  Prepare Pheresed Platelets     Status: None (Preliminary result)   Collection Time: 12/10/14  9:33 AM  Result Value Ref Range   Unit Number [id number]    Blood Component Type PLTP LI1 PAS    Unit division 00    Status of Unit ISSUED    Transfusion Status OK TO TRANSFUSE   Glucose, capillary     Status: Abnormal   Collection Time: 12/10/14 12:24 PM  Result Value Ref Range   Glucose-Capillary 177 (H) 65 - 99 mg/dL  Osmolality     Status: Abnormal  Collection Time: 12/10/14 12:47 PM  Result Value Ref Range   Osmolality 319 (H) 275 - 300 mOsm/kg  TSH     Status: None   Collection Time: 12/10/14 12:47 PM  Result Value Ref Range   TSH 0.556 0.350 - 4.500 uIU/mL  Glucose, capillary     Status: None   Collection Time: 12/10/14  6:23 PM  Result Value Ref Range   Glucose-Capillary 96 65 - 99 mg/dL  Potassium     Status: Abnormal   Collection Time: 12/10/14  8:45 PM  Result Value Ref Range   Potassium 3.2 (L) 3.5 - 5.1 mmol/L   Ct Abdomen Pelvis Wo Contrast  12/10/2014   CLINICAL DATA:  Constipation and abdominal distension. History of lymphoma.  EXAM: CT ABDOMEN AND PELVIS WITHOUT CONTRAST  TECHNIQUE: Multidetector CT imaging of the abdomen and pelvis was performed following the standard protocol without IV contrast.  COMPARISON:  CT scan 11/25/2014  FINDINGS: Lower chest: The heart is enlarged but stable. Small amount pericardial fluid is stable. There is an NG tube coursing down the esophagus and into the stomach. Mild  diffuse esophageal wall thickening. There is a right pleural effusion and right lower lobe infiltrate. Aspiration would be a consideration. The left lung base is clear.  Hepatobiliary: No focal hepatic lesions or intrahepatic biliary dilatation numerous gallstones are noted in the gallbladder. No common bile duct dilatation.  Pancreas: Moderate pancreatic atrophy but no mass, inflammation or duct dilatation.  Spleen: Normal size.  No focal lesions.  Adrenals/Urinary Tract: The adrenal glands and kidneys are stable. There is a double-J ureteral stent noted on the left side. No hydronephrosis.  Stomach/Bowel: The stomach, duodenum and small bowel are grossly normal without oral contrast no inflammatory changes, mass lesions or obstructive findings. The colon is distended with air and stool all the way down to the rectum which contains contrast material likely from the prior CT scan. Findings suggest constipation and fecal impaction in the rectum. Suspect benign pneumatosis coli involving the right colon.  Vascular/Lymphatic: Advanced atherosclerotic calcifications involving the aorta and branch vessels. No aneurysm. Bulky retroperitoneal and pelvic lymphadenopathy not significantly change when compartment to prior recent study.  Other: Foley catheter noted in the bladder. Bulky pelvic sidewall adenopathy. Bilateral inguinal adenopathy.  Musculoskeletal: No significant bony findings. Moderate osteoporosis.  IMPRESSION: 1. Large amount of air in stool throughout the colon and down into the rectum suggesting constipation and fecal impaction. 2. Stable bulky retrocrural, retroperitoneal, pelvic and inguinal adenopathy consistent with known lymphoma. No significant interval change since the prior study from 11/25/2014. 3. Cholelithiasis. 4. Right pleural effusion and right lower lobe infiltrate.   Electronically Signed   By: Marijo Sanes M.D.   On: 12/10/2014 18:24   Dg Abd 1 View  12/10/2014   CLINICAL DATA:  Ileus   EXAM: ABDOMEN - 1 VIEW  COMPARISON:  12/09/2014  FINDINGS: Left ureteral stent appears unchanged in position. Nasogastric tube extends into the stomach. Gaseous distention of the colon persists without significant change. No free air is evident.  IMPRESSION: Unchanged gaseous distention of the colon.   Electronically Signed   By: Andreas Newport M.D.   On: 12/10/2014 07:03   Dg Abd 1 View  12/09/2014   CLINICAL DATA:  Ileus.  EXAM: ABDOMEN - 1 VIEW  COMPARISON:  12/08/2014  FINDINGS: Left ureteral stent remains in place, unchanged. Oral contrast material in the rectosigmoid colon, stable. NG tube in stable position with the tip in the distal stomach.  Mild gaseous distension of the right colon and transverse colon, similar to prior study. No new bowel dilatation. No free air organomegaly.  IMPRESSION: Stable exam.   Electronically Signed   By: Rolm Baptise M.D.   On: 12/09/2014 13:08    Assessment:  Caleb Francis is a 69 y.o. 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 He was admitted with Klebsiella sepsis due to pyelonephritis and associated altered mental status. He subsequently developed renal insufficiency with assciated hypernatremia. He remains in the ICU. He has some component of ICU psychosis.  He had a platelet transfusion reaction on 12/06/2014. Blood cultures from 12/06/2014 are growing GPC in both aerobic and anaerobic bottles (no ID yet). He had an upper GI bleed on 12/07/2014. Bleeding has stopped. Hematocrit has stabilized. Platelets remain low.  WBC is improving.  He has an ileus. He remains lethargic.  Plan: 1. Hematology/Oncology: Day 14 s/p chemotherapy. WBC is improving.  [place] 900.  Patient remains neutropenic. Continue daily GCSF until [place] > 5000. Platelet transfusion today.  Premed all blood products with Tylenol and Benadryl. 2. Infectious disease: Appreciate ID consult. On cetazidime, vancomycin, and Flagyl.  Await ID  of GPC in aerobic and anaerobic bottles. No further positive blood cultures. 3. Nephrology: Hypernatremia and renal insufficiency. D5W continues. 4. Neurology: Appreciate neurology consult.  Lethargic. Patient with metabolic encephalopathy and ICU psychosis.   5. Gastroenterology: UGI bleed resolved. Ileus. On stool softeners and lactulose. CT scan today to assess abdominal distention with noted constipation and fecal impaction.  Nothing per rectum until no longer neutropenic.   6. Disposition: Prognosis guarded.  DNR code status.  Lequita Asal, MD  12/10/2014, 10:45 PM

## 2014-12-10 NOTE — Progress Notes (Signed)
No changes this shift

## 2014-12-10 NOTE — Progress Notes (Signed)
Dr. Benjie Karvonen paged and notified that Dr. Marianna Payment wanted nothing given rectal and that order was placed for CT of abdomen.  Orders received to discontinue enema orders.

## 2014-12-10 NOTE — Care Management Note (Signed)
Case Management Note  Patient Details  Name: Caleb Francis MRN: [id number] Date of Birth: [birth year]  Subjective/Objective:  Remains on IV antibiotics, lethargic, electrolytes improved, PLT 13. No BM x 1 week, abdomen distended.  Oncology following and would ultimately like to see patient transferred to [facility] for bone marrow transplant.   Transfer to the floor.                  Action/Plan: From [facility]   Expected Discharge Date:                  Expected Discharge Plan:  [place]  In-House Referral:  Clinical Social Work  Discharge planning Services     Post Acute Care Choice:    Choice offered to:     DME Arranged:    DME Agency:     HH Arranged:    [place] Agency:     Status of Service:     Medicare Important Message Given:  Yes-third notification given Date Medicare IM Given:  12/03/14 Medicare IM give by:  Orvan July Date Additional Medicare IM Given:  12/06/14 Additional Medicare Important Message give by:  Orvan July  If discussed at Long Length of Stay Meetings, dates discussed:    Additional Comments:  Jolly Mango, RN 12/10/2014, 10:54 AM

## 2014-12-10 NOTE — Progress Notes (Signed)
[place] INFECTIOUS DISEASE PROGRESS NOTE Date of Admission:  11/29/2014     ID: Caleb Francis is a 69 y.o. male with sepsis and mantel cell lymphoma   Principal Problem:   Sepsis Active Problems:   Mantle cell lymphoma   Altered mental status   Chest pain   Metabolic encephalopathy   Subjective: More awake and interactive no fevers. BCX + 1/2 GPC.   ROS  Eleven systems are reviewed and negative except per hpi  Medications:  Antibiotics Given (last 72 hours)    Date/Time Action Medication Dose Rate   12/08/14 0809 Given   vancomycin (VANCOCIN) IVPB 750 mg/150 ml premix 750 mg 150 mL/hr   12/08/14 1057 Given   cefTAZidime (FORTAZ) 2 g in dextrose 5 % 50 mL IVPB 2 g 100 mL/hr   12/08/14 1116 Given   metroNIDAZOLE (FLAGYL) IVPB 500 mg 500 mg 100 mL/hr   12/08/14 1807 Given   metroNIDAZOLE (FLAGYL) IVPB 500 mg 500 mg 100 mL/hr   12/09/14 0310 Given   metroNIDAZOLE (FLAGYL) IVPB 500 mg 500 mg 100 mL/hr   12/09/14 0936 Given   cefTAZidime (FORTAZ) 2 g in dextrose 5 % 50 mL IVPB 2 g 100 mL/hr   12/09/14 0951 Given   erythromycin 250 mg in sodium chloride 0.9 % 100 mL IVPB 250 mg 100 mL/hr   12/09/14 1041 Given   vancomycin (VANCOCIN) IVPB 750 mg/150 ml premix 750 mg 150 mL/hr   12/09/14 1042 Given   metroNIDAZOLE (FLAGYL) IVPB 500 mg 500 mg 100 mL/hr   12/09/14 1357 Given   erythromycin 250 mg in sodium chloride 0.9 % 100 mL IVPB 250 mg 100 mL/hr   12/09/14 1815 Given   metroNIDAZOLE (FLAGYL) IVPB 500 mg 500 mg 100 mL/hr   12/09/14 2209 Given   erythromycin 250 mg in sodium chloride 0.9 % 100 mL IVPB 250 mg 100 mL/hr   12/10/14 0300 Given   metroNIDAZOLE (FLAGYL) IVPB 500 mg 500 mg 100 mL/hr   12/10/14 0555 Given   erythromycin 250 mg in sodium chloride 0.9 % 100 mL IVPB 250 mg 100 mL/hr   12/10/14 0911 Given   vancomycin (VANCOCIN) IVPB 750 mg/150 ml premix 750 mg 150 mL/hr   12/10/14 1059 Given   cefTAZidime (FORTAZ) 2 g in dextrose 5 % 50 mL IVPB 2 g 100  mL/hr   12/10/14 1248 Given   metroNIDAZOLE (FLAGYL) IVPB 500 mg 500 mg 100 mL/hr     . antiseptic oral rinse  7 mL Mouth Rinse BID  . aspirin  324 mg Oral Once  . cefTAZidime (FORTAZ)  IV  2 g Intravenous Q24H  . erythromycin  250 mg Intravenous 3 times per day  . filgrastim  480 mcg Subcutaneous Once  . filgrastim  480 mcg Subcutaneous Daily  . insulin aspart  1-3 Units Subcutaneous Q6H  . lactulose  30 g Oral BID  . methylPREDNISolone (SOLU-MEDROL) injection  60 mg Intravenous Once  . metronidazole  500 mg Intravenous Q8H  . scopolamine  1 patch Transdermal Q72H  . sennosides  10 mL Oral BID  . thiamine IV  100 mg Intravenous Daily  . Vancomycin  750 mg Intravenous Q24H  . ziprasidone  10 mg Intramuscular QHS    Objective: Vital signs in last 24 hours: Temp:  [97.8 F (36.6 C)-98.1 F (36.7 C)] 97.8 F (36.6 C) (06/28 1433) Pulse Rate:  [99-120] 105 (06/28 1433) Resp:  [17-48] 24 (06/28 1433) BP: (105-158)/(67-89) 105/72 mmHg (06/28 1433)  SpO2:  [99 %-100 %] 100 % (06/28 1433) Weight:  [65.8 kg (145 lb 1 oz)] 65.8 kg (145 lb 1 oz) (06/28 0500) GEN - cachectic, min responsive \\Muddy  sclera, ngt in place Reg Coarse bs bil Abd is distented tympanic No edema Neuro lethargic Lab Results  Recent Labs  12/09/14 0509 12/09/14 1242 12/10/14 0500  WBC  --  1.3* 2.1*  HGB  --  9.3* 8.9*  HCT  --  27.8* 26.5*  NA 153*  --  149*  K 3.1* 3.6 3.2*  CL 125*  --  120*  CO2 22  --  24  BUN 53*  --  41*  CREATININE 2.25*  --  1.92*    Microbiology: Results for orders placed or performed during the hospital encounter of 11/29/14  MRSA PCR Screening     Status: None   Collection Time: 11/29/14 12:13 PM  Result Value Ref Range Status   MRSA by PCR NEGATIVE NEGATIVE Final    Comment:        The GeneXpert MRSA Assay (FDA approved for NASAL specimens only), is one component of a comprehensive MRSA colonization surveillance program. It is not intended to diagnose  MRSA infection nor to guide or monitor treatment for MRSA infections.   Culture, blood (routine x 2)     Status: None   Collection Time: 11/29/14  1:19 PM  Result Value Ref Range Status   Specimen Description BLOOD  Final   Special Requests Immunocompromised  Final   Culture NO GROWTH 5 DAYS  Final   Report Status 12/04/2014 FINAL  Final  Urine culture     Status: None   Collection Time: 11/30/14 10:12 AM  Result Value Ref Range Status   Specimen Description URINE, CATHETERIZED  Final   Special Requests Immunocompromised  Final   Culture NO GROWTH 2 DAYS  Final   Report Status 12/02/2014 FINAL  Final  Culture, blood (routine x 2)     Status: None (Preliminary result)   Collection Time: 12/06/14  8:08 PM  Result Value Ref Range Status   Specimen Description BLOOD  Final   Special Requests NONE  Final   Culture  Setup Time   Final    GRAM POSITIVE COCCI IN BOTH AEROBIC AND ANAEROBIC BOTTLES CRITICAL RESULT CALLED TO, READ BACK BY AND VERIFIED WITH: JENNIFER BEZARD AT 9798 12/08/14.PMH CONFIRMED BY RWW    Culture   Final    GRAM POSITIVE COCCI IN BOTH AEROBIC AND ANAEROBIC BOTTLES IDENTIFICATION TO FOLLOW    Report Status PENDING  Incomplete  Culture, blood (routine x 2)     Status: None (Preliminary result)   Collection Time: 12/06/14  8:40 PM  Result Value Ref Range Status   Specimen Description BLOOD  Final   Special Requests NONE  Final   Culture NO GROWTH 4 DAYS  Final   Report Status PENDING  Incomplete  Culture, blood (routine x 2)     Status: None (Preliminary result)   Collection Time: 12/08/14 11:40 AM  Result Value Ref Range Status   Specimen Description BLOOD  Final   Special Requests Normal  Final   Culture NO GROWTH 2 DAYS  Final   Report Status PENDING  Incomplete  Culture, blood (routine x 2)     Status: None (Preliminary result)   Collection Time: 12/08/14 11:49 AM  Result Value Ref Range Status   Specimen Description BLOOD  Final   Special Requests  Normal  Final   Culture NO  GROWTH 2 DAYS  Final   Report Status PENDING  Incomplete    Studies/Results: Dg Abd 1 View  12/10/2014   CLINICAL DATA:  Ileus  EXAM: ABDOMEN - 1 VIEW  COMPARISON:  12/09/2014  FINDINGS: Left ureteral stent appears unchanged in position. Nasogastric tube extends into the stomach. Gaseous distention of the colon persists without significant change. No free air is evident.  IMPRESSION: Unchanged gaseous distention of the colon.   Electronically Signed   By: Andreas Newport M.D.   On: 12/10/2014 07:03   Dg Abd 1 View  12/09/2014   CLINICAL DATA:  Ileus.  EXAM: ABDOMEN - 1 VIEW  COMPARISON:  12/08/2014  FINDINGS: Left ureteral stent remains in place, unchanged. Oral contrast material in the rectosigmoid colon, stable. NG tube in stable position with the tip in the distal stomach.  Mild gaseous distension of the right colon and transverse colon, similar to prior study. No new bowel dilatation. No free air organomegaly.  IMPRESSION: Stable exam.   Electronically Signed   By: Rolm Baptise M.D.   On: 12/09/2014 13:08   Echo - Left ventricle: The cavity size was moderately dilated. There was mild concentric hypertrophy. Systolic function was moderately to severely reduced. The estimated ejection fraction was 35%. Diffuse hypokinesis. Doppler parameters are consistent with abnormal left ventricular relaxation (grade 1 diastolic dysfunction). - Aortic valve: There was severe regurgitation. Valve area (Vmax): 3.45 cm^2. - Mitral valve: There was moderate regurgitation. - Atrial septum: No defect or patent foramen ovale was identified. - Tricuspid valve: There was moderate regurgitation. Impressions: - severe LV dysfunction with left radical ejection fraction approximately 94% and mild diastolic dysfunction. There is severe aortic regurgitation and moderate mitral regurgitation advise cardiac evaluation.  Assessment/Plan: RUPERTO KIERNAN is a 69  y.o. male with mantle cell lymphoma undergoing chemo admitted 6/15 from oncology clinic with AMS, chest pain. He was found to have a UTI with U CX + and bacteremia with Klebsiella oxytoca and had been on ceftriaxone until 6/24 when he developed worsening confusion, chills and hemodynamic instability at time of platelet transfusion. He has been neutropenic as well and is getting GCSF with his chemo. He also has profound metabolic derangement with hypernatremia, ARF. He may also have an ileus and has NGT in place. Bcx are from 6/24 now with GPC in 1 of 2 yet to be finalized. FU [facility] 6/26 NGTD Remains neutropenic, and with TCP.   Recommendations To cover the Klebsiella oxytoca and cover for neutropenia would continue ceftazidime.  GPC bacteremia 6/24 -  repeat bcx 6/26 ngtd Echo neg for Endocarditis but has severe CHF   Cont vanco flagyl for the bacteremia at this point  -If  Staph aureus will need portacath removed Cont supportive care Thank you very much for the consult. Will follow with you.  [place], [place]   12/10/2014, 2:46 PM

## 2014-12-10 NOTE — Progress Notes (Signed)
Verbal order received from Dr. Benjie Karvonen to discontinue duplicate orders in chart and orders that are not applicable at this time.

## 2014-12-10 NOTE — Progress Notes (Signed)
Initial Nutrition Assessment    INTERVENTION:   (Coordination of Care): discussed nutritional poc during ICU rounds and with MD Mody. Not able to initiate EN at present due to pt vomitting this AM. Pt unable to participate in swallow evaluation for diet advancement due to mentation. Discussed possibility of TPN but pt is at high risk for infection due to immunocompromised state. Discussed possibility of rectal tube for decompression of colonic ileus, suppository/enema for constipation. Not an option at present due to neutropenia, low platelet count per MD. Pt is receiving some calories via current dextrose infusiuon but does not meet patient's nutritional needs and does not provide protein. Per MD will assess for initiation of nutrition support daily.   NUTRITION DIAGNOSIS:  Inadequate oral intake related to inability to eat as evidenced by NPO status. Continues  GOAL:  Patient will meet greater than or equal to 90% of their needs   MONITOR:   (Energy Intake, Digestive System, Electrolyte/Renal Profile, Anthropometrics, Anemia Profile)  ASSESSMENT:  Pt remains lethargic  Diet Order: remains NPO  Digestive System: abdomen distended, no BM since admission, +emesis, NG to LIS with minimal output, abdominal xray with unchanged gaseous distention of colon   Recent Labs Lab 12/08/14 0530 12/09/14 0509 12/09/14 1242 12/10/14 0500  NA 155* 153*  --  149*  K 3.9 3.1* 3.6 3.2*  CL 126* 125*  --  120*  CO2 20* 22  --  24  BUN 64* 53*  --  41*  CREATININE 2.63* 2.25*  --  1.92*  CALCIUM 8.5* 8.5*  --  8.6*  MG 2.4 2.1  --  1.9  PHOS 6.0* 3.2  --  3.1  GLUCOSE 184* 131*  --  132*    Nutritional Anemia Profile:  CBC Latest Ref Rng 12/10/2014 12/09/2014 12/08/2014  WBC 3.8 - 10.6 K/uL 2.1(L) 1.3(LL) 0.9(LL)  Hemoglobin 13.0 - 18.0 g/dL 8.9(L) 9.3(L) 7.9(L)  Hematocrit 40.0 - 52.0 % 26.5(L) 27.8(L) 23.7(L)  Platelets 150 - 440 K/uL 13(LL) 7(LL) 16(LL)    Meds: D5 at 200 ml/hr  (816 kcals), erythromycin, lactulose  Height:  Ht Readings from Last 1 Encounters:  11/29/14 6' (1.829 m)    Weight:  Wt Readings from Last 1 Encounters:  12/10/14 145 lb 1 oz (65.8 kg)    Filed Weights   12/08/14 0500 12/09/14 0606 12/10/14 0500  Weight: 142 lb 3.2 oz (64.5 kg) 141 lb 15.6 oz (64.4 kg) 145 lb 1 oz (65.8 kg)     Wt Readings from Last 10 Encounters:  12/10/14 145 lb 1 oz (65.8 kg)  11/27/14 150 lb 5.7 oz (68.2 kg)  11/25/14 156 lb (70.761 kg)  11/18/14 157 lb 11.2 oz (71.532 kg)  11/05/14 164 lb 0.4 oz (74.4 kg)  10/24/14 165 lb (74.844 kg)  10/24/14 169 lb 5 oz (76.8 kg)  10/22/14 168 lb (76.204 kg)    BMI:  Body mass index is 19.67 kg/(m^2).  Estimated Nutritional Needs:  Kcal:  1994-2357kcals, BEE: 1511kcals, TEE: IF 1.1-1.3)(AF 1.2)  Protein:  74-88g protein (1.0-1.2g/kg)  Fluid:  1838-2222mL of fluid (25-32mL/kg)  Diet Order:  Diet NPO time specified  EDUCATION NEEDS:  No education needs identified at this time   Intake/Output Summary (Last 24 hours) at 12/10/14 1211 Last data filed at 12/10/14 1000  Gross per 24 hour  Intake   5250 ml  Output   4675 ml  Net    575 ml    HIGH Care Level  Kerman Passey MS, RD,  LDN [phone]) [phone] Pager

## 2014-12-10 NOTE — Progress Notes (Signed)
[place] at [facility]                                                                                                                                                                                            Patient Demographics   Caleb Francis, is a 69 y.o. male, DOB - 29-Sep-1945, IWL:[id number]  Admit date - 11/29/2014   Admitting Physician Aldean Jewett, MD  Outpatient Primary MD for the patient is No PCP Per Patient   Subjective: Patient remains lethargic. Son is at bedside. Patient had an episode of vomiting this morning. Patient has a distended belly. No bowel movement for over one week.  Review of Systems:   Review of Systems  Unable to perform ROS: critical illness      Vitals:   Filed Vitals:   12/10/14 0500 12/10/14 0800 12/10/14 0900 12/10/14 1000  BP:  136/85 128/80 131/81  Pulse:  116 115 112  Temp:  98 F (36.7 C)    TempSrc:  Axillary    Resp:  20 27 23   Height:      Weight: 65.8 kg (145 lb 1 oz)     SpO2:  100% 100% 100%    Wt Readings from Last 3 Encounters:  12/10/14 65.8 kg (145 lb 1 oz)  11/27/14 68.2 kg (150 lb 5.7 oz)  11/25/14 70.761 kg (156 lb)     Intake/Output Summary (Last 24 hours) at 12/10/14 1033 Last data filed at 12/10/14 1000  Gross per 24 hour  Intake   6250 ml  Output   5625 ml  Net    625 ml    Physical Exam:   GENERAL:  Has NGT placed critically ill-appearing HEAD, EYES, EARS, NOSE AND THROAT NGT placed PERRLA Atraumatic  NECK: Supple. There is no jugular venous distention. No bruits, no lymphadenopathy, no thyromegaly.  HEART:  Tachycardic 2/6 systolic ejection murmur no rubs LUNGS: Upper airway wheezing. ABDOMEN: Distended bowel hypoactive rebound or guarding  EXTREMITIES: No evidence of any cyanosis, clubbing, .  NEUROLOGIC: Lethargic  SKIN: Moist and warm with no rashes appreciated.  Psych:    no agitation reported overnight.  Radiology Reports 6/26 KUB: improved  COLONIC  ileus     CBC  Recent Labs Lab 12/04/14 0839  12/07/14 2129 12/08/14 0530 12/08/14 1526 12/09/14 1242 12/10/14 0500  WBC 2.7*  < > 0.7* 0.6* 0.9* 1.3* 2.1*  HGB 7.2*  < > 8.5* 8.5* 7.9* 9.3* 8.9*  HCT 22.2*  < > 25.3* 25.2* 23.7* 27.8* 26.5*  PLT 24*  < > 17* 34* 16* 7* 13*  MCV 87.0  < >  84.2 84.2 84.2 85.9 86.2  MCH 28.2  < > 28.1 28.5 28.2 28.7 28.9  MCHC 32.5  < > 33.4 33.9 33.4 33.4 33.6  RDW 20.0*  < > 17.2* 18.6* 18.1* 17.9* 17.7*  LYMPHSABS 1.0  --   --   --   --   --  0.9  MONOABS 0.1*  --   --   --   --   --  0.1  EOSABS 0.1  --   --   --   --   --  0.0  BASOSABS 0.0  --   --   --   --   --  0.0  < > = values in this interval not displayed.  Chemistries   Recent Labs Lab 12/03/14 2359  12/06/14 0437 12/07/14 0555 12/08/14 0530 12/09/14 0509 12/09/14 1242 12/10/14 0500  NA  --   < > 154* 156* 155* 153*  --  149*  K  --   < > 4.2 4.4 3.9 3.1* 3.6 3.2*  CL  --   < > 130* 125* 126* 125*  --  120*  CO2  --   < > 18* 21* 20* 22  --  24  GLUCOSE  --   < > 208* 253* 184* 131*  --  132*  BUN  --   < > 42* 46* 64* 53*  --  41*  CREATININE  --   < > 2.04* 2.57* 2.63* 2.25*  --  1.92*  CALCIUM  --   < > 8.6* 8.9 8.5* 8.5*  --  8.6*  MG  --   < > 1.9 2.3 2.4 2.1  --  1.9  AST 20  --  28  --   --   --   --   --   ALT 17  --  20  --   --   --   --   --   ALKPHOS 141*  --  141*  --   --   --   --   --   BILITOT 2.1*  --  2.2*  --   --   --   --   --   < > = values in this interval not displayed. ------------------------------------------------------------------------------------------------------------------ estimated creatinine clearance is 34.7 mL/min (by C-G formula based on Cr of 1.92). ------------------------------------------------------------------------------------------------------------------ No results for input(s): HGBA1C in the last 72  hours. ------------------------------------------------------------------------------------------------------------------ No results for input(s): CHOL, HDL, LDLCALC, TRIG, CHOLHDL, LDLDIRECT in the last 72 hours. ------------------------------------------------------------------------------------------------------------------ No results for input(s): TSH, T4TOTAL, T3FREE, THYROIDAB in the last 72 hours.  Invalid input(s): FREET3 ------------------------------------------------------------------------------------------------------------------ No results for input(s): VITAMINB12, FOLATE, FERRITIN, TIBC, IRON, RETICCTPCT in the last 72 hours.  Coagulation profile  Recent Labs Lab 12/07/14 2129  INR 1.28    No results for input(s): DDIMER in the last 72 hours.  Cardiac Enzymes No results for input(s): CKMB, TROPONINI, MYOGLOBIN in the last 168 hours.  Invalid input(s): CK ------------------------------------------------------------------------------------------------------------------ Invalid input(s): [place]  This is a 69 year old male with mantle cell lymphoma undergoing chemotherapy who was admitted June 15 from oncology clinic with altered mental status and chest pain.    #1 Sepsis: Patient's urine culture and blood cultures were positive for Klebsiella on admission. He  was initially placed on Rocephin.  Due to his low blood pressure and elevated heart rates on 12/06/2014 he was started on Zosyn and vancomycin.  Dr. Ola Spurr recommends changing Rocephin to ceftaz  to cover for KLEBSIELLA as  well as neutropenia.  Repeat blood cx on 12/06/2014 are growing gram-positive cocci in both aerobic and anaerobic bottles. Vancomycin and Flagyl will be continued until final culture report. If the patient has staph in his blood cultures he may need to have the port removed. Repeat blood cultures on 12/09/2014 are negative to date.  He was temporarily placed on pressors on  6/24 with the transfusion reaction  but has now been off of this and BP stable. He remins CRITICALLY ILL   #2 altered mental status - This is due to metabolic encephalopathy due to sepsis and urinary tract infection/ARF/Hypernatremia along with ICU delirium. - CT head/MRI brain showing no evidence of acute pathology.  - cont. D5W for hypernatremia, cont. IV abx for UTI/bacteremia and follow mental status.  - appreciate neurology consultation   #3 chest pain on admission: No EKG changes to suggest ischemia.  Continue to monitor for any further cardiac symptoms  #4 Klebsiella urinary tract infection with bacteremia: He is now on CEFTAZADIME for broader coverage after hypotension and tachycardia episode on 6/24.  5 Mantle cell lymphoma: Patient is on salvage chemotherapy. Patient follows with Dr. Mike Gip.   He is s/p chemo He is on GCSF and remains neutrapenic.  #6. Panctytopenia: Due to chemotherapy and Mantle cell lymphoma, patient is currently on Neupogen. Patient is s/p 2 units PRBC on 6/25 and has received PLT transfusion pastt several days. Patient needs premedication prior to any transfusions including Benadryl and Tylenol.    #7. Acute renal failure due to ATN : Appreciate nephrology input. We will continue with D5 as prescribed by nephrology for his hypernatremia. His kidney function is improving. #8 Hypokalemia - continue supplementation when necessary #9 Thrombocytopenia This is due to sepsis.  Platelets are low this a.m. Platelet transfusion as per oncology.  #10. Sinus tachycardia: Possibly related to underlying dehydration/hypernatremia. Patient will continue telemetry monitoring.   12 colonic Ileus with GI bleed: Patient currently has an NG tube placed. Continue with NG tube  NO RECTAL MEDS DUE TO [place] Consider rectal tube or suppository once neutropenia resolves. 12. Hypernatremia unclear etiology: Improving. Appreciate nephrology consultation. 13. GI bleed:  Patient currently has an NG tube placed. We will continue PPI and CBC monitoring. At this time we will continue supportive therapy due to low platelets GI will be unable to do any invasive procedure. 14. Nutrition: Other big issue for the patient. Patient has not had a bowel movement or adequate nutrition since admission. I will consider TPN tomorrow pending his blood culture report.  18. Constipation: Patient not had a bowel movement in several days. Patient currently is NG tube placed. We are unable to do suppositories due to neutropenia. He is on several medications for constipation and has not been effective.  Prognosis is very poor   Patient is critically ill and high risk of cardiopulmonary arrest DNR  DVT Prophylaxis   SCD's   Lab Results  Component Value Date   PLT 13* 12/10/2014     Total Time Spent in minutes - 35 min   plan of care discussed with the patient's daughter and son at bedside.  Discussed with Palliative Care,   Greater than 50% of time spent in care and coordination  Ravindra Baranek M.D on 12/10/2014 at 10:33 AM  Between 7am to 6pm - Pager - [phone]  After 6pm go to [email or web address] - password [email or web address] [place] [facility]   Office  [phone]

## 2014-12-10 NOTE — Progress Notes (Signed)
Patient very restless, agitated and pulling at medical devices.  Upon assessment abdomen appears more disteded.  Dr. Benjie Karvonen called and notified. Orders received for fleets enema.

## 2014-12-10 NOTE — Consult Note (Signed)
Palliative Medicine Inpatient Consult Follow Up Note   Name: Caleb Francis Date: 12/10/2014 MRN: [id number]  DOB: Sep 25, 1945  Referring Physician: Bettey Costa, MD  Palliative Care consult requested for this 69 y.o. male for goals of medical therapy in patient with mantle cell lymphoma, getting chemotherapy, admitted with altered mental status  Caleb Francis is lying in bed in CCU. No response to voice. Family not present.    REVIEW OF SYSTEMS:  Patient is not able to provide ROS  CODE STATUS: DNR   PAST MEDICAL HISTORY: Past Medical History  Diagnosis Date  . Arthritis   . Hypertension   . RA (rheumatoid arthritis)   . Anemia   . Cancer     lymphoma  . History of nuclear stress test     a. 12/2013: low risk, no sig ischemia, no EKG changes, no artifact, EF 63%  . Chronic kidney disease     PAST SURGICAL HISTORY:  Past Surgical History  Procedure Laterality Date  . Back surgery    . Fracture surgery      ankle  . Portacath placement    . Cystoscopy w/ ureteral stent placement Left 10/24/2014    Procedure: CYSTOSCOPY WITH RETROGRADE PYELOGRAM/URETERAL STENT PLACEMENT;  Surgeon: Irine Seal, MD;  Location: [facility] ORS;  Service: Urology;  Laterality: Left;    Vital Signs: BP 130/84 mmHg  Pulse 106  Temp(Src) 98 F (36.7 C) (Axillary)  Resp 19  Ht 6' (1.829 m)  Wt 65.8 kg (145 lb 1 oz)  BMI 19.67 kg/m2  SpO2 99% Filed Weights   12/08/14 0500 12/09/14 0606 12/10/14 0500  Weight: 64.5 kg (142 lb 3.2 oz) 64.4 kg (141 lb 15.6 oz) 65.8 kg (145 lb 1 oz)    Estimated body mass index is 19.67 kg/(m^2) as calculated from the following:   Height as of this encounter: 6' (1.829 m).   Weight as of this encounter: 65.8 kg (145 lb 1 oz).  PHYSICAL EXAM: General: Critically ill appearing HEENT: OP clear, poor dentition Neck: Trachea midline  Cardiovascular: regular rhythm, tachycardic Pulmonary: fair air movemnt ant fields, no audible wheeze Abdominal: firm, distended,  hypoactive BS's   GU: no suprapubic tenderness Extremities: no edema BLE's Neurological: attempts to follow simple commands with prodding Skin: no rashes Psychiatric: unable to assess  LABS: CBC:    Component Value Date/Time   WBC 2.1* 12/10/2014 0500   WBC 6.4 07/17/2014 1121   HGB 8.9* 12/10/2014 0500   HGB 11.8* 07/17/2014 1121   HCT 26.5* 12/10/2014 0500   HCT 35.0* 07/17/2014 1121   PLT 13* 12/10/2014 0500   PLT 263 07/17/2014 1121   MCV 86.2 12/10/2014 0500   MCV 83 07/17/2014 1121   NEUTROABS 0.9* 12/10/2014 0500   NEUTROABS 3.4 07/17/2014 1121   LYMPHSABS 0.9 12/10/2014 0500   LYMPHSABS 1.9 07/17/2014 1121   MONOABS 0.1 12/10/2014 0500   MONOABS 0.8 07/17/2014 1121   EOSABS 0.0 12/10/2014 0500   EOSABS 0.2 07/17/2014 1121   BASOSABS 0.0 12/10/2014 0500   BASOSABS 0.0 07/17/2014 1121   Comprehensive Metabolic Panel:    Component Value Date/Time   NA 149* 12/10/2014 0500   NA 141 07/17/2014 1121   K 3.2* 12/10/2014 0500   K 2.8* 07/17/2014 1121   CL 120* 12/10/2014 0500   CL 100 07/17/2014 1121   CO2 24 12/10/2014 0500   CO2 32 07/17/2014 1121   BUN 41* 12/10/2014 0500   BUN 9 07/17/2014 1121   CREATININE 1.92* 12/10/2014  0500   CREATININE 1.09 07/17/2014 1121   GLUCOSE 132* 12/10/2014 0500   GLUCOSE 105* 07/17/2014 1121   CALCIUM 8.6* 12/10/2014 0500   CALCIUM 8.9 07/17/2014 1121   AST 28 12/06/2014 0437   AST 17 07/17/2014 1121   ALT 20 12/06/2014 0437   ALT 12* 07/17/2014 1121   ALKPHOS 141* 12/06/2014 0437   ALKPHOS 79 07/17/2014 1121   BILITOT 2.2* 12/06/2014 0437   PROT 5.9* 12/06/2014 0437   PROT 7.3 07/17/2014 1121   ALBUMIN 2.2* 12/06/2014 0437   ALBUMIN 3.1* 07/17/2014 1121    IMPRESSION: Caleb Francis is a 69 yo man with PMH of mantle cell lymphoma dx 02/2013 on salvage chemo, HTN, RA, 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 He was hospitalzed 5/27-11/19/14 with abd pain, sepsis, A/CKD. He was discharged to SNF and continued with  chemotherapy. He was readmitted 11/29/14 with altered mental status. Urine and blood cx's + for Klebsiella. Mental status remains poor. MRI without contrast shows no acute change. EEG c/w metabolic encephalopathy. Developed hypernatremia which is slowly correcting. Mental status not yet at baseline.  Pt has not improved. Sodium down to 149. Mental status worse today than yesterday but just received haldol for agitation. Prognosis poor.   PLAN: As above   More than 50% of the visit was spent in counseling/coordination of care: YES  Time spent: 25 minutes

## 2014-12-10 NOTE — Progress Notes (Signed)
Patient vomiting green bile.  Dr. Benjie Karvonen notified, orders to be placed.

## 2014-12-10 NOTE — Progress Notes (Signed)
Neurology Note  S: Mores somnolent today, increased vomiting  ROS unobtainable secondary to mental status  O: AFVSS NL weight, mild distress Normocephalic, oropharynx clear CTA B, no wheezing Tachycardic still, no murmur Distented, absent BS No C/C/E  Lethargic, mute, questionable follows, eyes open to stimulation PERRLA, EOMI, face symmetric Moves all ext equally  A/P: 1. Encephalopathy- worse today; Likely a component of ICU psychosis as well. Pt also has complicating nutritional deficit and electrolyte abnormalities but likely worse due to infections -  May benefit from surgery consult, continue NG tube to suction - antibiotics per ID - Watch for GI bleed with low platelets - continue Geodon 10mg  qHS regardless - continue lactulose 30gm BID - Continue thiamine 100mg  IV daily - PRN Geodon 10mg  q12h PRN agitation - ** Lights on during day and up in chair ** - would start nutrition as soon as tolerated - Will follow

## 2014-12-10 NOTE — Progress Notes (Signed)
CSW following for dc planning. Pt is from [facility]. Pt remains in CCU acutely ill. Palliative medicine has been consulted and meeting with family. CSW will continue to follow for dc planning as [name] care progresses.   Caleb Francis, [place]

## 2014-12-10 NOTE — Progress Notes (Signed)
Subjective:  Remains critically ill sitter at bedside UOP 6400 cc Na remains high although improving slowly moans   Objective:  Vital signs in last 24 hours:  Temp:  [97.8 F (36.6 C)-98.6 F (37 C)] 97.9 F (36.6 C) (06/28 0000) Pulse Rate:  [111-120] 112 (06/28 0400) Resp:  [15-48] 27 (06/28 0400) BP: (121-158)/(67-89) 135/79 mmHg (06/28 0400) SpO2:  [100 %] 100 % (06/28 0400) Weight:  [65.8 kg (145 lb 1 oz)] 65.8 kg (145 lb 1 oz) (06/28 0500)  Weight change: 1.4 kg (3 lb 1.4 oz) Filed Weights   12/08/14 0500 12/09/14 0606 12/10/14 0500  Weight: 64.5 kg (142 lb 3.2 oz) 64.4 kg (141 lb 15.6 oz) 65.8 kg (145 lb 1 oz)    Intake/Output: I/O last 3 completed shifts: In: 9675 [I.V.:7275; Blood:1200; IV Piggyback:1200] Out: 8950 [Urine:8950]   Intake/Output this shift:     Physical Exam: General: lethargic, ill appearing  Head:  NG in place  Eyes: Anicteric  Neck: Supple, trachea midline  Lungs:  Clear to auscultation normal effort  Heart: S1S2 tachycardic  Abdomen:  Soft, nontender, slight distension  Extremities: no peripheral edema  Neurologic:  able to verbalize at times,following few simple commands   Skin: No acute rashes  Access:   Foley with clear urine  Basic Metabolic Panel:  Recent Labs Lab 12/06/14 0437 12/07/14 0555 12/08/14 0530 12/09/14 0509 12/09/14 1242 12/10/14 0500  NA 154* 156* 155* 153*  --  149*  K 4.2 4.4 3.9 3.1* 3.6 3.2*  CL 130* 125* 126* 125*  --  120*  CO2 18* 21* 20* 22  --  24  GLUCOSE 208* 253* 184* 131*  --  132*  BUN 42* 46* 64* 53*  --  41*  CREATININE 2.04* 2.57* 2.63* 2.25*  --  1.92*  CALCIUM 8.6* 8.9 8.5* 8.5*  --  8.6*  MG 1.9 2.3 2.4 2.1  --  1.9  PHOS 2.4* 4.0 6.0* 3.2  --  3.1    Liver Function Tests:  Recent Labs Lab 12/03/14 2359 12/06/14 0437  AST 20 28  ALT 17 20  ALKPHOS 141* 141*  BILITOT 2.1* 2.2*  PROT 5.9* 5.9*  ALBUMIN 2.0* 2.2*   No results for input(s): LIPASE, AMYLASE in the last 168  hours.  Recent Labs Lab 12/03/14 2247  AMMONIA 53*    CBC:  Recent Labs Lab 12/04/14 0839  12/07/14 2129 12/08/14 0530 12/08/14 1526 12/09/14 1242 12/10/14 0500  WBC 2.7*  < > 0.7* 0.6* 0.9* 1.3* 2.1*  NEUTROABS 1.6  --   --   --   --   --  0.9*  HGB 7.2*  < > 8.5* 8.5* 7.9* 9.3* 8.9*  HCT 22.2*  < > 25.3* 25.2* 23.7* 27.8* 26.5*  MCV 87.0  < > 84.2 84.2 84.2 85.9 86.2  PLT 24*  < > 17* 34* 16* 7* 13*  < > = values in this interval not displayed.  Cardiac Enzymes: No results for input(s): CKTOTAL, CKMB, CKMBINDEX, TROPONINI in the last 168 hours.  BNP: Invalid input(s): POCBNP  CBG:  Recent Labs Lab 12/09/14 0525 12/09/14 1223 12/09/14 1823 12/09/14 2355 12/10/14 0555  GLUCAP 112* 150* 144* 159* 109*    Microbiology: Results for orders placed or performed during the hospital encounter of 11/29/14  MRSA PCR Screening     Status: None   Collection Time: 11/29/14 12:13 PM  Result Value Ref Range Status   MRSA by PCR NEGATIVE NEGATIVE Final  Comment:        The GeneXpert MRSA Assay (FDA approved for NASAL specimens only), is one component of a comprehensive MRSA colonization surveillance program. It is not intended to diagnose MRSA infection nor to guide or monitor treatment for MRSA infections.   Culture, blood (routine x 2)     Status: None   Collection Time: 11/29/14  1:19 PM  Result Value Ref Range Status   Specimen Description BLOOD  Final   Special Requests Immunocompromised  Final   Culture NO GROWTH 5 DAYS  Final   Report Status 12/04/2014 FINAL  Final  Urine culture     Status: None   Collection Time: 11/30/14 10:12 AM  Result Value Ref Range Status   Specimen Description URINE, CATHETERIZED  Final   Special Requests Immunocompromised  Final   Culture NO GROWTH 2 DAYS  Final   Report Status 12/02/2014 FINAL  Final  Culture, blood (routine x 2)     Status: None (Preliminary result)   Collection Time: 12/06/14  8:08 PM  Result Value  Ref Range Status   Specimen Description BLOOD  Final   Special Requests NONE  Final   Culture  Setup Time   Final    GRAM POSITIVE COCCI IN BOTH AEROBIC AND ANAEROBIC BOTTLES CRITICAL RESULT CALLED TO, READ BACK BY AND VERIFIED WITH: JENNIFER BEZARD AT 2831 12/08/14.PMH CONFIRMED BY [facility]    Culture   Final    GRAM POSITIVE COCCI IN BOTH AEROBIC AND ANAEROBIC BOTTLES IDENTIFICATION TO FOLLOW    Report Status PENDING  Incomplete  Culture, blood (routine x 2)     Status: None (Preliminary result)   Collection Time: 12/06/14  8:40 PM  Result Value Ref Range Status   Specimen Description BLOOD  Final   Special Requests NONE  Final   Culture NO GROWTH 3 DAYS  Final   Report Status PENDING  Incomplete  Culture, blood (routine x 2)     Status: None (Preliminary result)   Collection Time: 12/08/14 11:40 AM  Result Value Ref Range Status   Specimen Description BLOOD  Final   Special Requests Normal  Final   Culture NO GROWTH < 24 HOURS  Final   Report Status PENDING  Incomplete  Culture, blood (routine x 2)     Status: None (Preliminary result)   Collection Time: 12/08/14 11:49 AM  Result Value Ref Range Status   Specimen Description BLOOD  Final   Special Requests Normal  Final   Culture NO GROWTH < 24 HOURS  Final   Report Status PENDING  Incomplete    Coagulation Studies:  Recent Labs  12/07/14 2129  LABPROT 16.2*  INR 1.28    Urinalysis: No results for input(s): COLORURINE, LABSPEC, PHURINE, GLUCOSEU, HGBUR, BILIRUBINUR, KETONESUR, PROTEINUR, UROBILINOGEN, NITRITE, LEUKOCYTESUR in the last 72 hours.  Invalid input(s): APPERANCEUR    Imaging: Dg Abd 1 View  12/10/2014   CLINICAL DATA:  Ileus  EXAM: ABDOMEN - 1 VIEW  COMPARISON:  12/09/2014  FINDINGS: Left ureteral stent appears unchanged in position. Nasogastric tube extends into the stomach. Gaseous distention of the colon persists without significant change. No free air is evident.  IMPRESSION: Unchanged gaseous  distention of the colon.   Electronically Signed   By: Andreas Newport M.D.   On: 12/10/2014 07:03   Dg Abd 1 View  12/09/2014   CLINICAL DATA:  Ileus.  EXAM: ABDOMEN - 1 VIEW  COMPARISON:  12/08/2014  FINDINGS: Left ureteral stent remains in place,  unchanged. Oral contrast material in the rectosigmoid colon, stable. NG tube in stable position with the tip in the distal stomach.  Mild gaseous distension of the right colon and transverse colon, similar to prior study. No new bowel dilatation. No free air organomegaly.  IMPRESSION: Stable exam.   Electronically Signed   By: Rolm Baptise M.D.   On: 12/09/2014 13:08     Medications:   . dextrose 5 % with KCl 20 mEq / L 20 mEq (12/09/14 2209)  . feeding supplement (JEVITY 1.5 CAL/FIBER) 1,000 mL (12/06/14 1044)  . free water    . norepinephrine 4 mg (12/06/14 2017)   . sodium chloride  250 mL Intravenous Once  . acetaminophen  650 mg Oral Once  . antiseptic oral rinse  7 mL Mouth Rinse BID  . aspirin  324 mg Oral Once  . cefTAZidime (FORTAZ)  IV  2 g Intravenous Q24H  . diphenhydrAMINE  25 mg Intravenous Once  . erythromycin  250 mg Intravenous 3 times per day  . filgrastim  480 mcg Subcutaneous Once  . filgrastim  480 mcg Subcutaneous Daily  . insulin aspart  1-3 Units Subcutaneous Q6H  . lactulose  30 g Oral BID  . methylPREDNISolone (SOLU-MEDROL) injection  60 mg Intravenous Once  . metronidazole  500 mg Intravenous Q8H  . scopolamine  1 patch Transdermal Q72H  . sennosides  10 mL Oral BID  . sodium chloride  3 mL Intravenous Q12H  . thiamine IV  100 mg Intravenous Daily  . Vancomycin  750 mg Intravenous Q24H  . ziprasidone  10 mg Intramuscular QHS   acetaminophen **OR** acetaminophen, haloperidol lactate, heparin lock flush, heparin lock flush, heparin lock flush, heparin lock flush, heparin lock flush, heparin lock flush, heparin lock flush, heparin lock flush, heparin lock flush, heparin lock flush, magnesium hydroxide,  metoprolol, morphine injection, sodium chloride, sodium chloride, sodium chloride, sodium chloride, sodium chloride, sodium chloride, sodium chloride, sodium chloride, sodium chloride, sodium chloride, ziprasidone  Assessment/ Plan:  76 AAM with progressive mantle cell lymphoma, hx left sided hydronephrosis and hydroureter. S/p ureteral stent placement    1.  Acute renal failure due to ATN. 2.  Sepsis with klebsiella oxytoca. 3.  Hypernatremia.  4.  Hypokalemia.  Plan:  S Cr has improved, good UOP.   Continue high dose of d5W. Replace Kcl prn Check urine lytes   Leukopenia and thrombocytopenia noted, mgmt per IM/oncology team   LOS: 11 Kendric Sindelar 6/28/20168:58 AM

## 2014-12-10 NOTE — Progress Notes (Signed)
Dr. Marianna Payment verbalized that patient was not to have anything rectal due to neutropenia and low platelets.  Order obtained for CT of abdomen.

## 2014-12-11 ENCOUNTER — Encounter: Payer: Self-pay | Admitting: Urgent Care

## 2014-12-11 ENCOUNTER — Inpatient Hospital Stay: Payer: Medicare Other

## 2014-12-11 DIAGNOSIS — K567 Ileus, unspecified: Secondary | ICD-10-CM | POA: Insufficient documentation

## 2014-12-11 DIAGNOSIS — K5641 Fecal impaction: Secondary | ICD-10-CM

## 2014-12-11 LAB — CBC WITH DIFFERENTIAL/PLATELET
BLASTS: 0 %
Band Neutrophils: 15 % — ABNORMAL HIGH (ref 0–10)
Basophils Absolute: 0 10*3/uL (ref 0.0–0.1)
Basophils Relative: 0 % (ref 0–1)
EOS ABS: 0 10*3/uL (ref 0.0–0.7)
Eosinophils Relative: 0 % (ref 0–5)
HCT: 25.8 % — ABNORMAL LOW (ref 40.0–52.0)
Hemoglobin: 8.6 g/dL — ABNORMAL LOW (ref 13.0–18.0)
LYMPHS PCT: 12 % (ref 12–46)
Lymphs Abs: 0.4 10*3/uL — ABNORMAL LOW (ref 0.7–4.0)
MCH: 29.1 pg (ref 26.0–34.0)
MCHC: 33.3 g/dL (ref 32.0–36.0)
MCV: 87.4 fL (ref 80.0–100.0)
Metamyelocytes Relative: 1 %
Monocytes Absolute: 0.4 10*3/uL (ref 0.1–1.0)
Monocytes Relative: 10 % (ref 3–12)
Myelocytes: 0 %
NEUTROS ABS: 2.8 10*3/uL (ref 1.7–7.7)
NEUTROS PCT: 62 % (ref 43–77)
PROMYELOCYTES ABS: 0 %
Platelets: 10 10*3/uL — CL (ref 150–440)
RBC: 2.95 MIL/uL — ABNORMAL LOW (ref 4.40–5.90)
RDW: 18 % — AB (ref 11.5–14.5)
WBC: 3.6 10*3/uL — ABNORMAL LOW (ref 3.8–10.6)
nRBC: 0 /100 WBC

## 2014-12-11 LAB — BASIC METABOLIC PANEL
Anion gap: 6 (ref 5–15)
BUN: 49 mg/dL — AB (ref 6–20)
CALCIUM: 7.8 mg/dL — AB (ref 8.9–10.3)
CHLORIDE: 115 mmol/L — AB (ref 101–111)
CO2: 24 mmol/L (ref 22–32)
CREATININE: 2.85 mg/dL — AB (ref 0.61–1.24)
GFR calc Af Amer: 25 mL/min — ABNORMAL LOW (ref >60)
GFR calc non Af Amer: 21 mL/min — ABNORMAL LOW (ref >60)
GLUCOSE: 161 mg/dL — AB (ref 65–99)
Potassium: 3.2 mmol/L — ABNORMAL LOW (ref 3.5–5.1)
Sodium: 145 mmol/L (ref 135–145)

## 2014-12-11 LAB — PREPARE PLATELET PHERESIS: Unit division: 0

## 2014-12-11 LAB — CULTURE, BLOOD (ROUTINE X 2): Culture: NO GROWTH

## 2014-12-11 LAB — MAGNESIUM: Magnesium: 2 mg/dL (ref 1.7–2.4)

## 2014-12-11 LAB — GLUCOSE, CAPILLARY
Glucose-Capillary: 142 mg/dL — ABNORMAL HIGH (ref 65–99)
Glucose-Capillary: 144 mg/dL — ABNORMAL HIGH (ref 65–99)
Glucose-Capillary: 74 mg/dL (ref 65–99)
Glucose-Capillary: 92 mg/dL (ref 65–99)

## 2014-12-11 LAB — POTASSIUM: Potassium: 3.5 mmol/L (ref 3.5–5.1)

## 2014-12-11 MED ORDER — TRACE MINERALS CR-CU-MN-SE-ZN 10-1000-500-60 MCG/ML IV SOLN
INTRAVENOUS | Status: DC
  Administered 2014-12-11: 18:00:00 via INTRAVENOUS
  Filled 2014-12-11: qty 600

## 2014-12-11 MED ORDER — POTASSIUM CHLORIDE 20 MEQ PO PACK
20.0000 meq | PACK | Freq: Two times a day (BID) | ORAL | Status: DC
  Administered 2014-12-11 – 2014-12-15 (×9): 20 meq via ORAL
  Filled 2014-12-11 (×9): qty 1

## 2014-12-11 MED ORDER — POTASSIUM CL IN DEXTROSE 5% 20 MEQ/L IV SOLN
20.0000 meq | INTRAVENOUS | Status: DC
  Administered 2014-12-11 – 2014-12-12 (×2): 20 meq via INTRAVENOUS
  Filled 2014-12-11 (×5): qty 1000

## 2014-12-11 MED ORDER — FLEET ENEMA 7-19 GM/118ML RE ENEM
1.0000 | ENEMA | Freq: Once | RECTAL | Status: AC
  Administered 2014-12-11: 1 via RECTAL

## 2014-12-11 MED ORDER — MORPHINE SULFATE 2 MG/ML IJ SOLN
1.0000 mg | INTRAMUSCULAR | Status: DC | PRN
  Administered 2014-12-14 – 2014-12-29 (×8): 2 mg via INTRAVENOUS
  Filled 2014-12-11 (×8): qty 1

## 2014-12-11 MED ORDER — BISACODYL 10 MG RE SUPP
10.0000 mg | Freq: Every day | RECTAL | Status: DC
  Administered 2014-12-11 – 2014-12-12 (×2): 10 mg via RECTAL
  Filled 2014-12-11 (×2): qty 1

## 2014-12-11 MED ORDER — POTASSIUM CHLORIDE 2 MEQ/ML IV SOLN
Freq: Once | INTRAVENOUS | Status: AC
  Administered 2014-12-11: 10:00:00 via INTRAVENOUS
  Filled 2014-12-11: qty 20

## 2014-12-11 MED ORDER — BETHANECHOL CHLORIDE 10 MG PO TABS
5.0000 mg | ORAL_TABLET | ORAL | Status: DC | PRN
  Filled 2014-12-11: qty 1

## 2014-12-11 MED ORDER — SODIUM CHLORIDE 0.9 % IV SOLN
Freq: Once | INTRAVENOUS | Status: AC
  Administered 2014-12-11: 15:00:00 via INTRAVENOUS

## 2014-12-11 MED ORDER — DIPHENHYDRAMINE HCL 50 MG/ML IJ SOLN
25.0000 mg | Freq: Once | INTRAMUSCULAR | Status: AC
  Administered 2014-12-11: 25 mg via INTRAVENOUS
  Filled 2014-12-11: qty 1

## 2014-12-11 MED ORDER — MOMETASONE FURO-FORMOTEROL FUM 100-5 MCG/ACT IN AERO
2.0000 | INHALATION_SPRAY | Freq: Two times a day (BID) | RESPIRATORY_TRACT | Status: DC
  Administered 2014-12-11 – 2015-01-24 (×74): 2 via RESPIRATORY_TRACT
  Filled 2014-12-11 (×2): qty 8.8

## 2014-12-11 MED ORDER — ACETAMINOPHEN 325 MG PO TABS
650.0000 mg | ORAL_TABLET | Freq: Once | ORAL | Status: AC
  Administered 2014-12-11: 650 mg via ORAL
  Filled 2014-12-11: qty 2

## 2014-12-11 MED ORDER — INSULIN ASPART 100 UNIT/ML ~~LOC~~ SOLN
1.0000 [IU] | Freq: Four times a day (QID) | SUBCUTANEOUS | Status: DC
  Administered 2014-12-12: 2 [IU] via SUBCUTANEOUS
  Administered 2014-12-12: 1 [IU] via SUBCUTANEOUS
  Administered 2014-12-12 – 2014-12-13 (×5): 2 [IU] via SUBCUTANEOUS
  Administered 2014-12-14: 1 [IU] via SUBCUTANEOUS
  Administered 2014-12-14 (×2): 2 [IU] via SUBCUTANEOUS
  Administered 2014-12-14: 1 [IU] via SUBCUTANEOUS
  Administered 2014-12-14: 100 [IU] via SUBCUTANEOUS
  Administered 2014-12-15: 1 [IU] via SUBCUTANEOUS
  Administered 2014-12-15: 2 [IU] via SUBCUTANEOUS
  Administered 2014-12-15: 1 [IU] via SUBCUTANEOUS
  Administered 2014-12-16: 2 [IU] via SUBCUTANEOUS
  Administered 2014-12-16 – 2014-12-18 (×5): 1 [IU] via SUBCUTANEOUS
  Administered 2014-12-18: 2 [IU] via SUBCUTANEOUS
  Administered 2014-12-18: 1 [IU] via SUBCUTANEOUS
  Administered 2014-12-18: 2 [IU] via SUBCUTANEOUS
  Administered 2014-12-19: 3 [IU] via SUBCUTANEOUS
  Administered 2014-12-19: 2 [IU] via SUBCUTANEOUS
  Filled 2014-12-11 (×2): qty 1
  Filled 2014-12-11 (×2): qty 2
  Filled 2014-12-11 (×2): qty 1
  Filled 2014-12-11 (×4): qty 2
  Filled 2014-12-11: qty 1
  Filled 2014-12-11: qty 3
  Filled 2014-12-11 (×2): qty 1
  Filled 2014-12-11 (×3): qty 2
  Filled 2014-12-11 (×3): qty 1
  Filled 2014-12-11 (×2): qty 2
  Filled 2014-12-11: qty 1
  Filled 2014-12-11: qty 3
  Filled 2014-12-11 (×2): qty 2

## 2014-12-11 NOTE — Progress Notes (Signed)
No changes noted pt rested comfortably all through night.

## 2014-12-11 NOTE — Progress Notes (Signed)
Nutrition Follow-up  DOCUMENTATION CODES:  Severe malnutrition in context of acute illness/injury  INTERVENTION:   (Coordination of Care): discussed nutritional poc in ICU rounds, with MD Benjie Karvonen and with Lanette Hampshire. Pt has portacath and peripheral IV. Plan to start TPN today as pt with inadequate nutrition for at least 12 days, persistent colonic ileus. Plan for initiation of EN or po intake when clinically feasible.  PN: recommend starting 5%AA/20%Dextrose at low rate of 25 ml/hr as pt at high risk for refeeding syndrome, recommend checking appropriate labs, pharmacy consult to assist with electrolyte and glucose management. Strict I/O, daily weights.  Currently receiving additional 306 kcals from D5 infusion. Recommend goal rate of TPN (off D5) with 20% lipids 2x weekly providing 1975 kcals, 1920 mL of fluid, 96 g of protein. Meets 100% of protein and kcal needs. Will adjust goal rate if continues on D5 infusion.   NUTRITION DIAGNOSIS:  Inadequate oral intake related to inability to eat as evidenced by NPO status. Being addressed via TPN   GOAL:  Patient will meet greater than or equal to 90% of their needs  PN: tolerance  MONITOR:   (Energy Intake, Digestive System, Electrolyte/Renal Profile, Anthropometrics, Anemia Profile)  REASON FOR ASSESSMENT:  Consult New TPN/TNA  ASSESSMENT:  Pt more alert today, stated "hey, how are you" when this writer entered the room this afternoon. CT abdomen with fecal impaction, large distended colonic ileus,no obstruction; GI consulted today. Bethanecol started by MD, possible rectal tube.   Diet Order: NPO with inadequate nutrition since admission on 6/17  Digestive System: abdomen remains distended, some stool after being checked for impaction and post enema, NG with 200 mL dark brown/black output  Access: portacath  Meds: erythromycin, lactulose, dulcolax suppository, bethanecol, D5 at 75 ml/hr (306 kcals)  Electrolyte and Renal  Profile:  Recent Labs Lab 12/08/14 0530 12/09/14 0509  12/10/14 0500 12/10/14 2045 12/11/14 0537  BUN 64* 53*  --  41*  --  49*  CREATININE 2.63* 2.25*  --  1.92*  --  2.85*  NA 155* 153*  --  149*  --  145  K 3.9 3.1*  < > 3.2* 3.2* 3.2*  MG 2.4 2.1  --  1.9  --  2.0  PHOS 6.0* 3.2  --  3.1  --   --   < > = values in this interval not displayed. Glucose Profile:   Recent Labs  12/10/14 2351 12/11/14 0558 12/11/14 1145  GLUCAP 152* 142* 144*   Nutritional Anemia Profile:  CBC Latest Ref Rng 12/11/2014 12/10/2014 12/09/2014  WBC 3.8 - 10.6 K/uL 3.6(L) 2.1(L) 1.3(LL)  Hemoglobin 13.0 - 18.0 g/dL 8.6(L) 8.9(L) 9.3(L)  Hematocrit 40.0 - 52.0 % 25.8(L) 26.5(L) 27.8(L)  Platelets 150 - 440 K/uL 10(LL) 13(LL) 7(LL)    Nutrition Focused Physical Exam: Nutrition-Focused physical exam completed. Findings are mild fat depletion in all areas, moderate muscle depletion, and mild edema.   Height:  Ht Readings from Last 1 Encounters:  11/29/14 6' (1.829 m)    Weight:  Wt Readings from Last 1 Encounters:  12/11/14 154 lb 1.6 oz (69.9 kg)   Filed Weights   12/09/14 0606 12/10/14 0500 12/11/14 0700  Weight: 141 lb 15.6 oz (64.4 kg) 145 lb 1 oz (65.8 kg) 154 lb 1.6 oz (69.9 kg)     Wt Readings from Last 10 Encounters:  12/11/14 154 lb 1.6 oz (69.9 kg)  11/27/14 150 lb 5.7 oz (68.2 kg)  11/25/14 156 lb (70.761 kg)  11/18/14  157 lb 11.2 oz (71.532 kg)  11/05/14 164 lb 0.4 oz (74.4 kg)  10/24/14 165 lb (74.844 kg)  10/24/14 169 lb 5 oz (76.8 kg)  10/22/14 168 lb (76.204 kg)    BMI:  Body mass index is 20.9 kg/(m^2).  Estimated Nutritional Needs:  Kcal:  1994-2357kcals, BEE: 1511kcals, TEE: IF 1.1-1.3)(AF 1.2)  Protein:  74-88g protein (1.0-1.2g/kg)  Fluid:  1838-2249mL of fluid (25-92mL/kg)  Diet Order:  Diet NPO time specified .TPN (CLINIMIX-E) Adult  EDUCATION NEEDS:  No education needs identified at this time   Intake/Output Summary (Last 24 hours) at  12/11/14 1553 Last data filed at 12/10/14 1812  Gross per 24 hour  Intake   1562 ml  Output   1150 ml  Net    412 ml    HIGH Care Level  Kerman Passey MS, RD, LDN [phone] Pager

## 2014-12-11 NOTE — Progress Notes (Signed)
[place] [facility]                                                                                                                                                                                            Patient Demographics   Caleb Francis, is a 69 y.o. male, DOB - Nov 07, 1945, IFO:[id number]  Admit date - 11/29/2014   Admitting Physician Aldean Jewett, MD  Outpatient Primary MD for the patient is No PCP Per Patient   Subjective: Patient remains lethargic. Patient opens his eyes spontaneously. [facility] bedside. Review of Systems:   Review of Systems  Unable to perform ROS: critical illness      Vitals:   Filed Vitals:   12/11/14 0500 12/11/14 0600 12/11/14 0700 12/11/14 0800  BP: 91/61 78/60  83/52  Pulse: 108 106  109  Temp:    97.8 F (36.6 C)  TempSrc:    Axillary  Resp: 22 16  18   Height:      Weight:   69.9 kg (154 lb 1.6 oz)   SpO2: 100% 100%  100%    Wt Readings from Last 3 Encounters:  12/11/14 69.9 kg (154 lb 1.6 oz)  11/27/14 68.2 kg (150 lb 5.7 oz)  11/25/14 70.761 kg (156 lb)     Intake/Output Summary (Last 24 hours) at 12/11/14 1114 Last data filed at 12/10/14 1812  Gross per 24 hour  Intake   2262 ml  Output   1150 ml  Net   1112 ml    Physical Exam:   GENERAL:  Has NGT placed critically ill-appearing HEAD, EYES, EARS, NOSE AND THROAT NGT placed PERRLA Atraumatic  NECK: Supple. There is no jugular venous distention. No bruits, no lymphadenopathy, no thyromegaly.  HEART:  Tachycardic 2/6 systolic ejection murmur no rubs LUNGS: Upper airway wheezing. ABDOMEN: Distended bowel hypoactive without rebound or guarding  EXTREMITIES: No evidence of any cyanosis, clubbing, .  NEUROLOGIC: Lethargic  SKIN: Moist and warm with no rashes appreciated.  Psych:    no agitation reported overnight.  Radiology Reports 12/10/2014: CT abdomen: Large distended colonic ileus. No obstruction     CBC  Recent  Labs Lab 12/08/14 0530 12/08/14 1526 12/09/14 1242 12/10/14 0500 12/11/14 0537  WBC 0.6* 0.9* 1.3* 2.1* 3.6*  HGB 8.5* 7.9* 9.3* 8.9* 8.6*  HCT 25.2* 23.7* 27.8* 26.5* 25.8*  PLT 34* 16* 7* 13* 10*  MCV 84.2 84.2 85.9 86.2 87.4  MCH 28.5 28.2 28.7 28.9 29.1  MCHC 33.9 33.4 33.4 33.6 33.3  RDW 18.6* 18.1* 17.9* 17.7* 18.0*  LYMPHSABS  --   --   --  0.9 0.4*  MONOABS  --   --   --  0.1 0.4  EOSABS  --   --   --  0.0 0.0  BASOSABS  --   --   --  0.0 0.0    Chemistries   Recent Labs Lab 12/06/14 0437 12/07/14 0555 12/08/14 0530 12/09/14 0509 12/09/14 1242 12/10/14 0500 12/10/14 2045 12/11/14 0537  NA 154* 156* 155* 153*  --  149*  --  145  K 4.2 4.4 3.9 3.1* 3.6 3.2* 3.2* 3.2*  CL 130* 125* 126* 125*  --  120*  --  115*  CO2 18* 21* 20* 22  --  24  --  24  GLUCOSE 208* 253* 184* 131*  --  132*  --  161*  BUN 42* 46* 64* 53*  --  41*  --  49*  CREATININE 2.04* 2.57* 2.63* 2.25*  --  1.92*  --  2.85*  CALCIUM 8.6* 8.9 8.5* 8.5*  --  8.6*  --  7.8*  MG 1.9 2.3 2.4 2.1  --  1.9  --  2.0  AST 28  --   --   --   --   --   --   --   ALT 20  --   --   --   --   --   --   --   ALKPHOS 141*  --   --   --   --   --   --   --   BILITOT 2.2*  --   --   --   --   --   --   --    -------------------------------------------------------------------------------------------------------------------  Recent Labs  12/10/14 1247  TSH 0.556   ------------------------------------------------------------------------------------------------------------------  Coagulation profile  Recent Labs Lab 12/07/14 2129  INR 1.28     Assessment & Plan  This is a 69 year old male with mantle cell lymphoma undergoing chemotherapy who was admitted June 15 from oncology clinic with altered mental status and chest pain.    #1 Sepsis: Patient's urine culture and blood cultures were positive for Klebsiella on admission. He  was initially placed on Rocephin.  Due to his low blood pressure and  elevated heart rates on 12/06/2014 he was started on Zosyn and vancomycin.  Dr. Ola Spurr recommends changing Rocephin to ceftaz  to cover for KLEBSIELLA as well as neutropenia.  Repeat blood cx on 12/06/2014 are growing  STAPHYLOCOCCUS AURICULARIS . Patient has been adequately treated for Klebsiella and therefore I will discontinue ceftaz. Patient will all need be on vancomycin. At this time patient does not need his port removed. Repeat blood cultures thus far are negative to date. There is a suspicion that the platelets may have been the culprit for patient's hypotension and sepsis. He was temporarily placed on pressors on 6/24 with the transfusion reaction  but has now been off of this and BP stable. He remins CRITICALLY ILL   #2 altered mental status - This is due to metabolic encephalopathy due to sepsis and urinary tract infection/ARF/Hypernatremia along with ICU delirium. - CT head/MRI brain showing no evidence of acute pathology.  -Patient remains to have some lethargic and mental status changes however he has some improvement.  #3 chest pain on admission: No EKG changes to suggest ischemia.  Continue to monitor for any further cardiac symptoms  #4 Klebsiella urinary tract infection with bacteremia: Patient is being treated for 2 weeks on appropriate antibiotics. We will discuss continue ceftaz.   5 Mantle  cell lymphoma: Patient is on salvage chemotherapy. Patient follows with Dr. Mike Gip.   He is s/p chemo He is on GCSF and no longer neutropenic.   #6. Panctytopenia: Due to chemotherapy and Mantle cell lymphoma, patient is currently on Neupogen. Patient is s/p 2 units PRBC on 6/25 and has received PLT transfusion almost daily. Patient needs premedication prior to any transfusions including Benadryl and Tylenol.    #7. Acute renal failure due to ATN : Appreciate nephrology input. Slight increase in creatinine likely secondary to colonic ileus and hypotension.  #8 Hypokalemia -  continue supplementation when necessary #9 Thrombocytopenia This is due to sepsis.  Patient will receive platelet transfusion today. Premedication is in order.  #10. Sinus tachycardia: Continue telemetry monitor. 12 colonic Ileus with GI bleed: CT scan shows colonic ileus that has worsened. Patient is no longer neutropenic. I have started fleets enema and suppository. Patient may need a rectal tube as well. Plan of care discussed with the family and oncology.    12. Hypernatremia unclear etiology: Resolved. Appreciate nephrology consultation. 13. GI bleed: Patient currently has an NG tube placed. We will continue PPI and CBC monitoring. At this time we will continue supportive therapy due to low platelets GI will be unable to do any invasive procedure. 14. Nutrition: We'll start TPN. 18. Constipation: Patient not had a bowel movement in several days. Patient currently is NG tube placed. Plan as outlined above. We'll try enema and suppository if this does not work patient will need a rectal tube.   Overall Prognosis is very poor  Appreciate palliative care consultation   Patient is critically ill and high risk of cardiopulmonary arrest DNR  DVT Prophylaxis   SCD's   Lab Results  Component Value Date   PLT 10* 12/11/2014     Total Time Spent in minutes - 35 min   plan of care discussed with the patient's daughter and son at bedside.    Greater than 50% of time spent in care and coordination  Naftuli Dalsanto M.D on 12/11/2014 at 11:14 AM  Between 7am to 6pm - Pager - [phone]  After 6pm go to [email or web address] - password [email or web address]   Office  [phone]

## 2014-12-11 NOTE — Care Management (Signed)
Important Message  Patient Details  Name: DELLA HOMAN MRN: [id number] Date of Birth: [birth year]   Medicare Important Message Given:  Yes-fourth notification given    [name] 12/11/2014, 2:09 PM

## 2014-12-11 NOTE — Progress Notes (Signed)
[place] INFECTIOUS DISEASE PROGRESS NOTE Date of Admission:  11/29/2014     ID: Caleb Francis is a 69 y.o. male with sepsis and mantel cell lymphoma   Principal Problem:   Sepsis Active Problems:   Mantle cell lymphoma   Altered mental status   Chest pain   Metabolic encephalopathy   Ileus   Fecal impaction   Subjective: Some improvement in mentation. No fevers. Has started to move bowels  ROS  Eleven systems are reviewed and negative except per hpi  Medications:  Antibiotics Given (last 72 hours)    Date/Time Action Medication Dose Rate   12/08/14 1807 Given   metroNIDAZOLE (FLAGYL) IVPB 500 mg 500 mg 100 mL/hr   12/09/14 0310 Given   metroNIDAZOLE (FLAGYL) IVPB 500 mg 500 mg 100 mL/hr   12/09/14 0936 Given   cefTAZidime (FORTAZ) 2 g in dextrose 5 % 50 mL IVPB 2 g 100 mL/hr   12/09/14 0951 Given   erythromycin 250 mg in sodium chloride 0.9 % 100 mL IVPB 250 mg 100 mL/hr   12/09/14 1041 Given   vancomycin (VANCOCIN) IVPB 750 mg/150 ml premix 750 mg 150 mL/hr   12/09/14 1042 Given   metroNIDAZOLE (FLAGYL) IVPB 500 mg 500 mg 100 mL/hr   12/09/14 1357 Given   erythromycin 250 mg in sodium chloride 0.9 % 100 mL IVPB 250 mg 100 mL/hr   12/09/14 1815 Given   metroNIDAZOLE (FLAGYL) IVPB 500 mg 500 mg 100 mL/hr   12/09/14 2209 Given   erythromycin 250 mg in sodium chloride 0.9 % 100 mL IVPB 250 mg 100 mL/hr   12/10/14 0300 Given   metroNIDAZOLE (FLAGYL) IVPB 500 mg 500 mg 100 mL/hr   12/10/14 0555 Given   erythromycin 250 mg in sodium chloride 0.9 % 100 mL IVPB 250 mg 100 mL/hr   12/10/14 0911 Given   vancomycin (VANCOCIN) IVPB 750 mg/150 ml premix 750 mg 150 mL/hr   12/10/14 1059 Given   cefTAZidime (FORTAZ) 2 g in dextrose 5 % 50 mL IVPB 2 g 100 mL/hr   12/10/14 1248 Given   metroNIDAZOLE (FLAGYL) IVPB 500 mg 500 mg 100 mL/hr   12/10/14 1629 Given   erythromycin 250 mg in sodium chloride 0.9 % 100 mL IVPB 250 mg 100 mL/hr   12/10/14 1812 Given   metroNIDAZOLE (FLAGYL) IVPB 500 mg 500 mg 100 mL/hr   12/10/14 2317 Given  [received med from [place]. at 2315]   erythromycin 250 mg in sodium chloride 0.9 % 100 mL IVPB 250 mg 100 mL/hr   12/11/14 0300 Given   metroNIDAZOLE (FLAGYL) IVPB 500 mg 500 mg 100 mL/hr   12/11/14 0650 Given   erythromycin 250 mg in sodium chloride 0.9 % 100 mL IVPB 250 mg 100 mL/hr   12/11/14 1023 Given   vancomycin (VANCOCIN) IVPB 750 mg/150 ml premix 750 mg 150 mL/hr   12/11/14 1412 Given   erythromycin 250 mg in sodium chloride 0.9 % 100 mL IVPB 250 mg 100 mL/hr     . sodium chloride   Intravenous Once  . acetaminophen  650 mg Oral Once  . antiseptic oral rinse  7 mL Mouth Rinse BID  . aspirin  324 mg Oral Once  . bisacodyl  10 mg Rectal Daily  . diphenhydrAMINE  25 mg Intravenous Once  . erythromycin  250 mg Intravenous 3 times per day  . filgrastim  480 mcg Subcutaneous Once  . filgrastim  480 mcg Subcutaneous Daily  . insulin aspart  1-3 Units Subcutaneous Q6H  . lactulose  30 g Oral BID  . methylPREDNISolone (SOLU-MEDROL) injection  60 mg Intravenous Once  . mometasone-formoterol  2 puff Inhalation BID  . potassium chloride  20 mEq Oral BID  . scopolamine  1 patch Transdermal Q72H  . sennosides  10 mL Oral BID  . thiamine IV  100 mg Intravenous Daily  . Vancomycin  750 mg Intravenous Q24H  . ziprasidone  10 mg Intramuscular QHS    Objective: Vital signs in last 24 hours: Temp:  [97.5 F (36.4 C)-98.2 F (36.8 C)] 97.8 F (36.6 C) (06/29 0800) Pulse Rate:  [101-115] 115 (06/29 1400) Resp:  [16-28] 26 (06/29 1400) BP: (78-110)/(52-69) 100/59 mmHg (06/29 1400) SpO2:  [97 %-100 %] 100 % (06/29 1400) Weight:  [69.9 kg (154 lb 1.6 oz)] 69.9 kg (154 lb 1.6 oz) (06/29 0700) GEN - cachectic, able to answer questions \\Muddy  sclera, ngt in place Reg Coarse bs bil Abd is distented tympanic No edema Neuro lethargic Lab Results  Recent Labs  12/10/14 0500 12/10/14 2045 12/11/14 0537  WBC  2.1*  --  3.6*  HGB 8.9*  --  8.6*  HCT 26.5*  --  25.8*  NA 149*  --  145  K 3.2* 3.2* 3.2*  CL 120*  --  115*  CO2 24  --  24  BUN 41*  --  49*  CREATININE 1.92*  --  2.85*    Microbiology: Results for orders placed or performed during the hospital encounter of 11/29/14  MRSA PCR Screening     Status: None   Collection Time: 11/29/14 12:13 PM  Result Value Ref Range Status   MRSA by PCR NEGATIVE NEGATIVE Final    Comment:        The GeneXpert MRSA Assay (FDA approved for NASAL specimens only), is one component of a comprehensive MRSA colonization surveillance program. It is not intended to diagnose MRSA infection nor to guide or monitor treatment for MRSA infections.   Culture, blood (routine x 2)     Status: None   Collection Time: 11/29/14  1:19 PM  Result Value Ref Range Status   Specimen Description BLOOD  Final   Special Requests Immunocompromised  Final   Culture NO GROWTH 5 DAYS  Final   Report Status 12/04/2014 FINAL  Final  Urine culture     Status: None   Collection Time: 11/30/14 10:12 AM  Result Value Ref Range Status   Specimen Description URINE, CATHETERIZED  Final   Special Requests Immunocompromised  Final   Culture NO GROWTH 2 DAYS  Final   Report Status 12/02/2014 FINAL  Final  Culture, blood (routine x 2)     Status: None   Collection Time: 12/06/14  8:08 PM  Result Value Ref Range Status   Specimen Description BLOOD  Final   Special Requests NONE  Final   Culture  Setup Time   Final    GRAM POSITIVE COCCI IN BOTH AEROBIC AND ANAEROBIC BOTTLES CRITICAL RESULT CALLED TO, READ BACK BY AND VERIFIED WITH: JENNIFER BEZARD AT 1245 12/08/14.PMH CONFIRMED BY R[facility]    Culture   Final    STAPHYLOCOCCUS AURICULARIS IN BOTH AEROBIC AND ANAEROBIC BOTTLES    Report Status 12/11/2014 FINAL  Final   Organism ID, Bacteria STAPHYLOCOCCUS AURICULARIS  Final      Susceptibility   Staphylococcus auricularis - MIC*    CIPROFLOXACIN >=8 RESISTANT Resistant      ERYTHROMYCIN >=8 RESISTANT Resistant  GENTAMICIN <=0.5 SENSITIVE Sensitive     OXACILLIN >=4 RESISTANT Resistant     TETRACYCLINE <=1 SENSITIVE Sensitive     VANCOMYCIN 1 SENSITIVE Sensitive     CLINDAMYCIN <=0.25 SENSITIVE Sensitive     TRIMETH/SULFA Value in next row Sensitive      SENSITIVE<=20    LEVOFLOXACIN Value in next row Intermediate      INTERMEDIATE4    * STAPHYLOCOCCUS AURICULARIS  Culture, blood (routine x 2)     Status: None   Collection Time: 12/06/14  8:40 PM  Result Value Ref Range Status   Specimen Description BLOOD  Final   Special Requests NONE  Final   Culture NO GROWTH 5 DAYS  Final   Report Status 12/11/2014 FINAL  Final  Culture, blood (routine x 2)     Status: None (Preliminary result)   Collection Time: 12/08/14 11:40 AM  Result Value Ref Range Status   Specimen Description BLOOD  Final   Special Requests Normal  Final   Culture NO GROWTH 3 DAYS  Final   Report Status PENDING  Incomplete  Culture, blood (routine x 2)     Status: None (Preliminary result)   Collection Time: 12/08/14 11:49 AM  Result Value Ref Range Status   Specimen Description BLOOD  Final   Special Requests Normal  Final   Culture NO GROWTH 3 DAYS  Final   Report Status PENDING  Incomplete    Studies/Results: Ct Abdomen Pelvis Wo Contrast  12/10/2014   CLINICAL DATA:  Constipation and abdominal distension. History of lymphoma.  EXAM: CT ABDOMEN AND PELVIS WITHOUT CONTRAST  TECHNIQUE: Multidetector CT imaging of the abdomen and pelvis was performed following the standard protocol without IV contrast.  COMPARISON:  CT scan 11/25/2014  FINDINGS: Lower chest: The heart is enlarged but stable. Small amount pericardial fluid is stable. There is an NG tube coursing down the esophagus and into the stomach. Mild diffuse esophageal wall thickening. There is a right pleural effusion and right lower lobe infiltrate. Aspiration would be a consideration. The left lung base is clear.   Hepatobiliary: No focal hepatic lesions or intrahepatic biliary dilatation numerous gallstones are noted in the gallbladder. No common bile duct dilatation.  Pancreas: Moderate pancreatic atrophy but no mass, inflammation or duct dilatation.  Spleen: Normal size.  No focal lesions.  Adrenals/Urinary Tract: The adrenal glands and kidneys are stable. There is a double-J ureteral stent noted on the left side. No hydronephrosis.  Stomach/Bowel: The stomach, duodenum and small bowel are grossly normal without oral contrast no inflammatory changes, mass lesions or obstructive findings. The colon is distended with air and stool all the way down to the rectum which contains contrast material likely from the prior CT scan. Findings suggest constipation and fecal impaction in the rectum. Suspect benign pneumatosis coli involving the right colon.  Vascular/Lymphatic: Advanced atherosclerotic calcifications involving the aorta and branch vessels. No aneurysm. Bulky retroperitoneal and pelvic lymphadenopathy not significantly change when compartment to prior recent study.  Other: Foley catheter noted in the bladder. Bulky pelvic sidewall adenopathy. Bilateral inguinal adenopathy.  Musculoskeletal: No significant bony findings. Moderate osteoporosis.  IMPRESSION: 1. Large amount of air in stool throughout the colon and down into the rectum suggesting constipation and fecal impaction. 2. Stable bulky retrocrural, retroperitoneal, pelvic and inguinal adenopathy consistent with known lymphoma. No significant interval change since the prior study from 11/25/2014. 3. Cholelithiasis. 4. Right pleural effusion and right lower lobe infiltrate.   Electronically Signed   By: Mamie Nick.  Gallerani M.D.   On: 12/10/2014 18:24   Dg Abd 1 View  12/11/2014   CLINICAL DATA:  Ileus follow-up  EXAM: ABDOMEN - 1 VIEW  COMPARISON:  CT scan of the chest as well as KUB of December 10, 2014  FINDINGS: There remains significant gaseous distention of much of the  colon. There is contrast within the distal sigmoid and in the rectum. No free extraluminal gas collections are observed. The nasogastric tube has been advanced. The left ureteral stent is unchanged in position.  IMPRESSION: Stable appearance of the colon with gaseous distention compatible with diffuse colonic ileus. Contrast remains in the rectosigmoid.   Electronically Signed   By: Xavia Kniskern  [place] M.D.   On: 12/11/2014 07:43   Dg Abd 1 View  12/10/2014   CLINICAL DATA:  Ileus  EXAM: ABDOMEN - 1 VIEW  COMPARISON:  12/09/2014  FINDINGS: Left ureteral stent appears unchanged in position. Nasogastric tube extends into the stomach. Gaseous distention of the colon persists without significant change. No free air is evident.  IMPRESSION: Unchanged gaseous distention of the colon.   Electronically Signed   By: Andreas Newport M.D.   On: 12/10/2014 07:03   Echo - Left ventricle: The cavity size was moderately dilated. There was mild concentric hypertrophy. Systolic function was moderately to severely reduced. The estimated ejection fraction was 35%. Diffuse hypokinesis. Doppler parameters are consistent with abnormal left ventricular relaxation (grade 1 diastolic dysfunction). - Aortic valve: There was severe regurgitation. Valve area (Vmax): 3.45 cm^2. - Mitral valve: There was moderate regurgitation. - Atrial septum: No defect or patent foramen ovale was identified. - Tricuspid valve: There was moderate regurgitation. Impressions: - severe LV dysfunction with left radical ejection fraction approximately 37% and mild diastolic dysfunction. There is severe aortic regurgitation and moderate mitral regurgitation advise cardiac evaluation.  Assessment/Plan: Caleb Francis is a 69 y.o. male with mantle cell lymphoma undergoing chemo admitted 6/15 from oncology clinic with AMS, chest pain. He was found to have a UTI with U CX + and bacteremia with Klebsiella oxytoca and had been on  ceftriaxone until 6/24 when he developed worsening confusion, chills and hemodynamic instability at time of platelet transfusion. He has been neutropenic as well and is getting GCSF with his chemo. He also has profound metabolic derangement with hypernatremia, ARF. He may also have an ileus and has NGT in place. Bcx are from 6/24 now with Staph auricularis in 1 of 2.. FU BCX 6/26 NGTD Remains neutropenic, and with TCP.   Recommendations He has completed a 2 week course of IV ceftazidime for  Klebsiella oxytoca - dc ceftazidime unless fevers recur GPC bacteremia with Staph auricularis 6/24 -  repeat bcx 6/26 ngtd Echo neg for Endocarditis but has severe CHF   Cont vanco for the Staph auricularis bacteremia-  -portacath need not be removed at this time.  Cont supportive care Thank you very much for the consult. Will follow with you.  Mel[place], [place]   12/11/2014, 2:45 PM

## 2014-12-11 NOTE — Consult Note (Signed)
Gastroenterology Consultation  Referring Provider:  Dr Benjie Karvonen Primary Care Physician:  No PCP Per Patient Primary Gastroenterologist:  Dr Allen Norris (new)         Reason for Consultation:     Colonic ileus  Date of Admission:  11/29/2014 Date of Consultation:  12/11/2014        HPI:   Caleb Francis is a 69 y.o. male admitted with sepsis, history of lymphoma on chemotherapy, ileus, & now colonic ileus.  He has significant thrombocytopenia (plts 10k) & is neutropenic.  Rectal interventions are contraindicated per Dr Mike Gip.  Family states patient has some nausea & vomiting yesterday but deny any long term nausea, vomiting or abdominal pain.  Family denies hx of heartburn, indigestion, dysphagia, or odynophagia.  Family denies constipation, diarrhea, rectal bleeding, or melena prior to hospitalization.  He has had persistent colonic distention since admission with CT showing a large amount of air in stool throughout the colon and down into the rectum suggesting constipation and fecal impaction as well as stable bulky retrocrural, retroperitoneal, pelvic and inguinal adenopathy consistent with known lymphoma. No significant interval change since the prior study from 11/25/2014.  NGT in place currently.  Past Medical History  Diagnosis Date  . Arthritis   . Hypertension   . RA (rheumatoid arthritis)   . Anemia   . Mantle cell lymphoma     Dr Mike Gip  . History of nuclear stress test     a. 12/2013: low risk, no sig ischemia, no EKG changes, no artifact, EF 63%  . Chronic kidney disease   . Sepsis     Dr Ola Spurr    Past Surgical History  Procedure Laterality Date  . Back surgery    . Fracture surgery      ankle  . Portacath placement    . Cystoscopy w/ ureteral stent placement Left 10/24/2014    Procedure: CYSTOSCOPY WITH RETROGRADE PYELOGRAM/URETERAL STENT PLACEMENT;  Surgeon: Irine Seal, MD;  Location: [facility] ORS;  Service: Urology;  Laterality: Left;    Prior to Admission  medications   Medication Sig Start Date End Date Taking? Authorizing Provider  amLODipine (NORVASC) 5 MG tablet Take 5 mg by mouth daily.   Yes Historical Provider, MD  docusate sodium (COLACE) 100 MG capsule Take 1 capsule (100 mg total) by mouth 2 (two) times daily. 10/27/14  Yes Lloyd Huger, MD  folic acid (FOLVITE) 1 MG tablet Take 1 mg by mouth daily.   Yes Historical Provider, MD  hydroxychloroquine (PLAQUENIL) 200 MG tablet Take 400 mg by mouth daily.   Yes Historical Provider, MD  magnesium oxide (MAG-OX) 400 (241.3 MG) MG tablet Take 1 tablet (400 mg total) by mouth 2 (two) times daily. 10/29/14  Yes Lequita Asal, MD  meloxicam (MOBIC) 7.5 MG tablet Take 7.5 mg by mouth daily.   Yes Historical Provider, MD  metoprolol tartrate (LOPRESSOR) 25 MG tablet Take 1 tablet (25 mg total) by mouth 2 (two) times daily. 11/19/14  Yes Nicholes Mango, MD  oxybutynin (DITROPAN) 5 MG tablet Take 1 tablet (5 mg total) by mouth every 8 (eight) hours as needed for bladder spasms (spasms or stent related pain). Patient taking differently: Take 5 mg by mouth every 8 (eight) hours as needed for bladder spasms (or stent related pain).  10/30/14  Yes Max Sane, MD  oxyCODONE-acetaminophen (PERCOCET/ROXICET) 5-325 MG per tablet Take 1-2 tablets by mouth every 4 (four) hours as needed for moderate pain. Patient taking differently: Take 1-2 tablets  by mouth every 6 (six) hours as needed for moderate pain.  11/19/14  Yes Nicholes Mango, MD  potassium chloride (K-DUR) 10 MEQ tablet Take 4 tablets (40 mEq total) by mouth 2 (two) times daily. 11/19/14  Yes Nicholes Mango, MD  predniSONE (DELTASONE) 5 MG tablet Take 5 mg by mouth daily.   Yes Historical Provider, MD  senna-docusate (SENOKOT-S) 8.6-50 MG per tablet Take 2 tablets by mouth 2 (two) times daily. Patient taking differently: Take 2 tablets by mouth 2 (two) times daily as needed for mild constipation.  11/19/14  Yes Nicholes Mango, MD  spironolactone (ALDACTONE) 25 MG  tablet Take 25 mg by mouth daily.   Yes Historical Provider, MD  acetaminophen (TYLENOL) 325 MG tablet Take 2 tablets (650 mg total) by mouth every 6 (six) hours as needed for mild pain or fever. Patient not taking: Reported on 11/29/2014 10/31/14   Max Sane, MD  allopurinol (ZYLOPRIM) 300 MG tablet Take 1 tablet (300 mg total) by mouth daily. Patient not taking: Reported on 11/29/2014 10/29/14   Lequita Asal, MD  alum & mag hydroxide-simeth (MAALOX/MYLANTA) 200-200-20 MG/5ML suspension Take 30 mLs by mouth every 6 (six) hours as needed for indigestion or heartburn. Patient not taking: Reported on 11/29/2014 11/19/14   Nicholes Mango, MD  antiseptic oral rinse (CPC / CETYLPYRIDINIUM CHLORIDE 0.05%) 0.05 % LIQD solution 7 mLs by Mouth Rinse route 2 times daily at 12 noon and 4 pm. Patient not taking: Reported on 11/29/2014 11/19/14   Nicholes Mango, MD  feeding supplement, ENSURE ENLIVE, (ENSURE ENLIVE) LIQD Take 237 mLs by mouth 2 (two) times daily between meals. Patient not taking: Reported on 11/29/2014 11/19/14   Nicholes Mango, MD    Family History  Problem Relation Age of Onset  . Cancer Mother     breast  . Cancer Father     bone cancer  . Colon cancer Neg Hx   . Liver disease Neg Hx      History   Social History Narrative   History  Substance Use Topics  . Smoking status: Former Smoker -- 0.50 packs/day for 60 years    Types: Cigarettes    Quit date: 10/18/2014  . Smokeless tobacco: Not on file  . Alcohol Use: No    Allergies as of 11/29/2014  . (No Known Allergies)    Review of Systems:    All systems reviewed and negative except where noted in HPI.   Physical Exam:  Vital signs in last 24 hours: Temp:  [97.5 F (36.4 C)-98.2 F (36.8 C)] 97.8 F (36.6 C) (06/29 0800) Pulse Rate:  [99-114] 109 (06/29 0800) Resp:  [16-26] 18 (06/29 0800) BP: (78-130)/(52-84) 83/52 mmHg (06/29 0800) SpO2:  [97 %-100 %] 100 % (06/29 0800) Weight:  [154 lb 1.6 oz (69.9 kg)] 154 lb 1.6 oz  (69.9 kg) (06/29 0700) Last BM Date:  (unknown) Body mass index is 20.9 kg/(m^2). General:   Alert,  Thin, chronically ill-appearing male resting in bed quietly.  Easily arouseable, but confused.  Son & wife at bedside.  Pt unable to answer my questions.   Head:  +exopthalmos.  Normocephalic and atraumatic. Eyes:  Sclera clear, no icterus.   Conjunctiva pink. Ears:  Normal auditory acuity. Nose:  No deformity, discharge, or lesions. Mouth:  No deformity or lesions,oropharynx pink & dry. Neck:  Supple; no masses or thyromegaly. Lungs:  Respirations even and unlabored.  Clear throughout to auscultation.   No wheezes, crackles, or rhonchi. No acute distress.  Heart:  Regular rate and rhythm; no murmurs, clicks, rubs, or gallops. Abdomen: Absent bowel sounds.  No bruits.  Soft, moderately distended.  Unable to palpate masses, hepatosplenomegaly or hernias due to distention.  No guarding or rebound tenderness.     Rectal:  Deferred.  Msk:  Symmetrical without gross deformities.   Pulses:  Normal pulses noted. Extremities:  + clubbing.  No edema.  No cyanosis. Neurologic:  Alert and disoriented x3;  confused Skin:  Intact without significant lesions or rashes.  No jaundice. Lymph Nodes:  No significant cervical adenopathy.   LAB RESULTS:  Recent Labs  12/09/14 1242 12/10/14 0500 12/11/14 0537  WBC 1.3* 2.1* 3.6*  HGB 9.3* 8.9* 8.6*  HCT 27.8* 26.5* 25.8*  PLT 7* 13* 10*   BMET  Recent Labs  12/09/14 0509  12/10/14 0500 12/10/14 2045 12/11/14 0537  NA 153*  --  149*  --  145  K 3.1*  < > 3.2* 3.2* 3.2*  CL 125*  --  120*  --  115*  CO2 22  --  24  --  24  GLUCOSE 131*  --  132*  --  161*  BUN 53*  --  41*  --  49*  CREATININE 2.25*  --  1.92*  --  2.85*  CALCIUM 8.5*  --  8.6*  --  7.8*  < > = values in this interval not displayed.  STUDIES: Ct Abdomen Pelvis Wo Contrast  12/10/2014   CLINICAL DATA:  Constipation and abdominal distension. History of lymphoma.  EXAM: CT  ABDOMEN AND PELVIS WITHOUT CONTRAST  TECHNIQUE: Multidetector CT imaging of the abdomen and pelvis was performed following the standard protocol without IV contrast.  COMPARISON:  CT scan 11/25/2014  FINDINGS: Lower chest: The heart is enlarged but stable. Small amount pericardial fluid is stable. There is an NG tube coursing down the esophagus and into the stomach. Mild diffuse esophageal wall thickening. There is a right pleural effusion and right lower lobe infiltrate. Aspiration would be a consideration. The left lung base is clear.  Hepatobiliary: No focal hepatic lesions or intrahepatic biliary dilatation numerous gallstones are noted in the gallbladder. No common bile duct dilatation.  Pancreas: Moderate pancreatic atrophy but no mass, inflammation or duct dilatation.  Spleen: Normal size.  No focal lesions.  Adrenals/Urinary Tract: The adrenal glands and kidneys are stable. There is a double-J ureteral stent noted on the left side. No hydronephrosis.  Stomach/Bowel: The stomach, duodenum and small bowel are grossly normal without oral contrast no inflammatory changes, mass lesions or obstructive findings. The colon is distended with air and stool all the way down to the rectum which contains contrast material likely from the prior CT scan. Findings suggest constipation and fecal impaction in the rectum. Suspect benign pneumatosis coli involving the right colon.  Vascular/Lymphatic: Advanced atherosclerotic calcifications involving the aorta and branch vessels. No aneurysm. Bulky retroperitoneal and pelvic lymphadenopathy not significantly change when compartment to prior recent study.  Other: Foley catheter noted in the bladder. Bulky pelvic sidewall adenopathy. Bilateral inguinal adenopathy.  Musculoskeletal: No significant bony findings. Moderate osteoporosis.  IMPRESSION: 1. Large amount of air in stool throughout the colon and down into the rectum suggesting constipation and fecal impaction. 2. Stable  bulky retrocrural, retroperitoneal, pelvic and inguinal adenopathy consistent with known lymphoma. No significant interval change since the prior study from 11/25/2014. 3. Cholelithiasis. 4. Right pleural effusion and right lower lobe infiltrate.   Electronically Signed   By: Marijo Sanes  M.D.   On: 12/10/2014 18:24   Dg Abd 1 View  12/11/2014   CLINICAL DATA:  Ileus follow-up  EXAM: ABDOMEN - 1 VIEW  COMPARISON:  CT scan of the chest as well as KUB of December 10, 2014  FINDINGS: There remains significant gaseous distention of much of the colon. There is contrast within the distal sigmoid and in the rectum. No free extraluminal gas collections are observed. The nasogastric tube has been advanced. The left ureteral stent is unchanged in position.  IMPRESSION: Stable appearance of the colon with gaseous distention compatible with diffuse colonic ileus. Contrast remains in the rectosigmoid.   Electronically Signed   By: David  Martinique M.D.   On: 12/11/2014 07:43   Dg Abd 1 View  12/10/2014   CLINICAL DATA:  Ileus  EXAM: ABDOMEN - 1 VIEW  COMPARISON:  12/09/2014  FINDINGS: Left ureteral stent appears unchanged in position. Nasogastric tube extends into the stomach. Gaseous distention of the colon persists without significant change. No free air is evident.  IMPRESSION: Unchanged gaseous distention of the colon.   Electronically Signed   By: Andreas Newport M.D.   On: 12/10/2014 07:03   Dg Abd 1 View  12/09/2014   CLINICAL DATA:  Ileus.  EXAM: ABDOMEN - 1 VIEW  COMPARISON:  12/08/2014  FINDINGS: Left ureteral stent remains in place, unchanged. Oral contrast material in the rectosigmoid colon, stable. NG tube in stable position with the tip in the distal stomach.  Mild gaseous distension of the right colon and transverse colon, similar to prior study. No new bowel dilatation. No free air organomegaly.  IMPRESSION: Stable exam.   Electronically Signed   By: Rolm Baptise M.D.   On: 12/09/2014 13:08     Impression  / Plan:   Caleb Francis is a 69 y.o. y/o male with mantle cell lymphoma s/p chemo with significant thrombocytopenia, neutropenia & sepsis who has had persistent colonic ileus this admission.  He also has diffuse esophageal thickening on CT as well.  He is on erythromycin.  Discussed with Dr Allen Norris. Care is challenging if rectal tube & preps are contraindicated per attending oncologist.  Plan: 1) Bethanecol 5mg , repeat hourly until results x4 doses 2) KUB in AM  Thank you for involving me in the care of this patient.  The care of Caleb Francis will be discussed in direct collaboration with Dr Lucilla Lame, Attending Gastroenterologist.   LOS: 12 days  Vickey Huger, NP  12/11/2014, 10:40 AM [facility]  [address] [place], [place] [address] Phone: [phone] Fax : [phone]

## 2014-12-11 NOTE — Consult Note (Signed)
69 y.o. male for goals of medical therapy in patient with mantle cell lymphoma, getting chemotherapy, admitted with altered mental status Significant improvement in examination today. S/p discussion with son.   Past Medical History  Diagnosis Date  . Arthritis   . Hypertension   . RA (rheumatoid arthritis)   . Anemia   . Mantle cell lymphoma     Dr Mike Gip  . History of nuclear stress test     a. 12/2013: low risk, no sig ischemia, no EKG changes, no artifact, EF 63%  . Chronic kidney disease   . Sepsis     Dr Ola Spurr    Past Surgical History  Procedure Laterality Date  . Back surgery    . Fracture surgery      ankle  . Portacath placement    . Cystoscopy w/ ureteral stent placement Left 10/24/2014    Procedure: CYSTOSCOPY WITH RETROGRADE PYELOGRAM/URETERAL STENT PLACEMENT;  Surgeon: Irine Seal, MD;  Location: [facility] ORS;  Service: Urology;  Laterality: Left;    Family History  Problem Relation Age of Onset  . Cancer Mother     breast  . Cancer Father     bone cancer  . Colon cancer Neg Hx   . Liver disease Neg Hx     Social History:  reports that he quit smoking about 7 weeks ago. His smoking use included Cigarettes. He has a 30 pack-year smoking history. He does not have any smokeless tobacco history on file. He reports that he does not drink alcohol or use illicit drugs.  No Known Allergies  Medications: I have reviewed the patient's current medications.  ROS: Unable to obtain   Physical Examination: Blood pressure 100/55, pulse 115, temperature 97.6 F (36.4 C), temperature source Axillary, resp. rate 26, height 6' (1.829 m), weight 69.9 kg (154 lb 1.6 oz), SpO2 100 %.  Opens eyes. Follows simple commands EOM intact, respond to visual threats Moves extremities symmetrically, but weak.    Laboratory Studies:   Basic Metabolic Panel:  Recent Labs Lab 12/06/14 0437 12/07/14 0555 12/08/14 0530 12/09/14 0509 12/09/14 1242 12/10/14 0500 12/10/14 2045  12/11/14 0537  NA 154* 156* 155* 153*  --  149*  --  145  K 4.2 4.4 3.9 3.1* 3.6 3.2* 3.2* 3.2*  CL 130* 125* 126* 125*  --  120*  --  115*  CO2 18* 21* 20* 22  --  24  --  24  GLUCOSE 208* 253* 184* 131*  --  132*  --  161*  BUN 42* 46* 64* 53*  --  41*  --  49*  CREATININE 2.04* 2.57* 2.63* 2.25*  --  1.92*  --  2.85*  CALCIUM 8.6* 8.9 8.5* 8.5*  --  8.6*  --  7.8*  MG 1.9 2.3 2.4 2.1  --  1.9  --  2.0  PHOS 2.4* 4.0 6.0* 3.2  --  3.1  --   --     Liver Function Tests:  Recent Labs Lab 12/06/14 0437  AST 28  ALT 20  ALKPHOS 141*  BILITOT 2.2*  PROT 5.9*  ALBUMIN 2.2*   No results for input(s): LIPASE, AMYLASE in the last 168 hours. No results for input(s): AMMONIA in the last 168 hours.  CBC:  Recent Labs Lab 12/08/14 0530 12/08/14 1526 12/09/14 1242 12/10/14 0500 12/11/14 0537  WBC 0.6* 0.9* 1.3* 2.1* 3.6*  NEUTROABS  --   --   --  0.9* 2.8  HGB 8.5* 7.9* 9.3* 8.9* 8.6*  HCT 25.2* 23.7* 27.8* 26.5* 25.8*  MCV 84.2 84.2 85.9 86.2 87.4  PLT 34* 16* 7* 13* 10*    Cardiac Enzymes: No results for input(s): CKTOTAL, CKMB, CKMBINDEX, TROPONINI in the last 168 hours.  BNP: Invalid input(s): POCBNP  CBG:  Recent Labs Lab 12/10/14 1224 12/10/14 1823 12/10/14 2351 12/11/14 0558 12/11/14 1145  GLUCAP 177* 96 152* 142* 144*    Microbiology: Results for orders placed or performed during the hospital encounter of 11/29/14  MRSA PCR Screening     Status: None   Collection Time: 11/29/14 12:13 PM  Result Value Ref Range Status   MRSA by PCR NEGATIVE NEGATIVE Final    Comment:        The GeneXpert MRSA Assay (FDA approved for NASAL specimens only), is one component of a comprehensive MRSA colonization surveillance program. It is not intended to diagnose MRSA infection nor to guide or monitor treatment for MRSA infections.   Culture, blood (routine x 2)     Status: None   Collection Time: 11/29/14  1:19 PM  Result Value Ref Range Status   Specimen  Description BLOOD  Final   Special Requests Immunocompromised  Final   Culture NO GROWTH 5 DAYS  Final   Report Status 12/04/2014 FINAL  Final  Urine culture     Status: None   Collection Time: 11/30/14 10:12 AM  Result Value Ref Range Status   Specimen Description URINE, CATHETERIZED  Final   Special Requests Immunocompromised  Final   Culture NO GROWTH 2 DAYS  Final   Report Status 12/02/2014 FINAL  Final  Culture, blood (routine x 2)     Status: None   Collection Time: 12/06/14  8:08 PM  Result Value Ref Range Status   Specimen Description BLOOD  Final   Special Requests NONE  Final   Culture  Setup Time   Final    GRAM POSITIVE COCCI IN BOTH AEROBIC AND ANAEROBIC BOTTLES CRITICAL RESULT CALLED TO, READ BACK BY AND VERIFIED WITH: JENNIFER BEZARD AT 7353 12/08/14.PMH CONFIRMED BY [facility]    Culture   Final    STAPHYLOCOCCUS AURICULARIS IN BOTH AEROBIC AND ANAEROBIC BOTTLES    Report Status 12/11/2014 FINAL  Final   Organism ID, Bacteria STAPHYLOCOCCUS AURICULARIS  Final      Susceptibility   Staphylococcus auricularis - MIC*    CIPROFLOXACIN >=8 RESISTANT Resistant     ERYTHROMYCIN >=8 RESISTANT Resistant     GENTAMICIN <=0.5 SENSITIVE Sensitive     OXACILLIN >=4 RESISTANT Resistant     TETRACYCLINE <=1 SENSITIVE Sensitive     VANCOMYCIN 1 SENSITIVE Sensitive     CLINDAMYCIN <=0.25 SENSITIVE Sensitive     TRIMETH/SULFA Value in next row Sensitive      SENSITIVE<=20    LEVOFLOXACIN Value in next row Intermediate      INTERMEDIATE4    * STAPHYLOCOCCUS AURICULARIS  Culture, blood (routine x 2)     Status: None   Collection Time: 12/06/14  8:40 PM  Result Value Ref Range Status   Specimen Description BLOOD  Final   Special Requests NONE  Final   Culture NO GROWTH 5 DAYS  Final   Report Status 12/11/2014 FINAL  Final  Culture, blood (routine x 2)     Status: None (Preliminary result)   Collection Time: 12/08/14 11:40 AM  Result Value Ref Range Status   Specimen Description  BLOOD  Final   Special Requests Normal  Final   Culture NO GROWTH 3 DAYS  Final   Report Status PENDING  Incomplete  Culture, blood (routine x 2)     Status: None (Preliminary result)   Collection Time: 12/08/14 11:49 AM  Result Value Ref Range Status   Specimen Description BLOOD  Final   Special Requests Normal  Final   Culture NO GROWTH 3 DAYS  Final   Report Status PENDING  Incomplete    Coagulation Studies: No results for input(s): LABPROT, INR in the last 72 hours.  Urinalysis: No results for input(s): COLORURINE, LABSPEC, PHURINE, GLUCOSEU, HGBUR, BILIRUBINUR, KETONESUR, PROTEINUR, UROBILINOGEN, NITRITE, LEUKOCYTESUR in the last 168 hours.  Invalid input(s): APPERANCEUR  Lipid Panel:     Component Value Date/Time   TRIG 262* 12/06/2014 0437    HgbA1C: No results found for: HGBA1C  Urine Drug Screen:  No results found for: LABOPIA, COCAINSCRNUR, LABBENZ, AMPHETMU, THCU, LABBARB  Alcohol Level: No results for input(s): ETH in the last 168 hours.  Other results: EKG: normal EKG, normal sinus rhythm, unchanged from previous tracings.  Imaging: Ct Abdomen Pelvis Wo Contrast  12/10/2014   CLINICAL DATA:  Constipation and abdominal distension. History of lymphoma.  EXAM: CT ABDOMEN AND PELVIS WITHOUT CONTRAST  TECHNIQUE: Multidetector CT imaging of the abdomen and pelvis was performed following the standard protocol without IV contrast.  COMPARISON:  CT scan 11/25/2014  FINDINGS: Lower chest: The heart is enlarged but stable. Small amount pericardial fluid is stable. There is an NG tube coursing down the esophagus and into the stomach. Mild diffuse esophageal wall thickening. There is a right pleural effusion and right lower lobe infiltrate. Aspiration would be a consideration. The left lung base is clear.  Hepatobiliary: No focal hepatic lesions or intrahepatic biliary dilatation numerous gallstones are noted in the gallbladder. No common bile duct dilatation.  Pancreas: Moderate  pancreatic atrophy but no mass, inflammation or duct dilatation.  Spleen: Normal size.  No focal lesions.  Adrenals/Urinary Tract: The adrenal glands and kidneys are stable. There is a double-J ureteral stent noted on the left side. No hydronephrosis.  Stomach/Bowel: The stomach, duodenum and small bowel are grossly normal without oral contrast no inflammatory changes, mass lesions or obstructive findings. The colon is distended with air and stool all the way down to the rectum which contains contrast material likely from the prior CT scan. Findings suggest constipation and fecal impaction in the rectum. Suspect benign pneumatosis coli involving the right colon.  Vascular/Lymphatic: Advanced atherosclerotic calcifications involving the aorta and branch vessels. No aneurysm. Bulky retroperitoneal and pelvic lymphadenopathy not significantly change when compartment to prior recent study.  Other: Foley catheter noted in the bladder. Bulky pelvic sidewall adenopathy. Bilateral inguinal adenopathy.  Musculoskeletal: No significant bony findings. Moderate osteoporosis.  IMPRESSION: 1. Large amount of air in stool throughout the colon and down into the rectum suggesting constipation and fecal impaction. 2. Stable bulky retrocrural, retroperitoneal, pelvic and inguinal adenopathy consistent with known lymphoma. No significant interval change since the prior study from 11/25/2014. 3. Cholelithiasis. 4. Right pleural effusion and right lower lobe infiltrate.   Electronically Signed   By: Marijo Sanes M.D.   On: 12/10/2014 18:24   Dg Abd 1 View  12/11/2014   CLINICAL DATA:  Ileus follow-up  EXAM: ABDOMEN - 1 VIEW  COMPARISON:  CT scan of the chest as well as KUB of December 10, 2014  FINDINGS: There remains significant gaseous distention of much of the colon. There is contrast within the distal sigmoid and in the rectum. No free extraluminal gas collections  are observed. The nasogastric tube has been advanced. The left  ureteral stent is unchanged in position.  IMPRESSION: Stable appearance of the colon with gaseous distention compatible with diffuse colonic ileus. Contrast remains in the rectosigmoid.   Electronically Signed   By: David  Martinique M.D.   On: 12/11/2014 07:43   Dg Abd 1 View  12/10/2014   CLINICAL DATA:  Ileus  EXAM: ABDOMEN - 1 VIEW  COMPARISON:  12/09/2014  FINDINGS: Left ureteral stent appears unchanged in position. Nasogastric tube extends into the stomach. Gaseous distention of the colon persists without significant change. No free air is evident.  IMPRESSION: Unchanged gaseous distention of the colon.   Electronically Signed   By: Andreas Newport M.D.   On: 12/10/2014 07:03     Assessment/Plan: 69 y.o. male for goals of medical therapy in patient with mantle cell lymphoma, getting chemotherapy, admitted with altered mental status Significant improvement in examination today. S/p discussion with son.   Pt has multiple medical problems that could be contributing to the encephalopathy (metabolic), but his had a BM today and mental status much improved.  No further imaging recommendations at this point.   12/11/2014, 4:42 PM

## 2014-12-11 NOTE — Care Management Note (Signed)
Case Management Note  Patient Details  Name: Caleb Francis MRN: [id number] Date of Birth: Sep 18, 1945  Subjective/Objective: Patient remains as stepdown.  1 unit of PLTs today. Primary nurse working on BM due to abdominal distention. Ileus improved. Attending concerned with bacteremia, infectious disease following patient.    Action/Plan:   Expected Discharge Date:                  Expected Discharge Plan:  [place]  In-House Referral:  Clinical Social Work  Discharge planning Services     Post Acute Care Choice:    Choice offered to:     DME Arranged:    DME Agency:     HH Arranged:    [place] Agency:     Status of Service:     Medicare Important Message Given:  Yes-third notification given Date Medicare IM Given:  12/03/14 Medicare IM give by:  Orvan July Date Additional Medicare IM Given:  12/06/14 Additional Medicare Important Message give by:  Orvan July  If discussed at Long Length of Stay Meetings, dates discussed:    Additional Comments:  Jolly Mango, RN 12/11/2014, 11:47 AM

## 2014-12-11 NOTE — Progress Notes (Signed)
Dr. Benjie Karvonen verbally notified of hypotension, abnormal labs, CT of abdomen results.  Information acknowledged. No orders obtained at this time.

## 2014-12-11 NOTE — Consult Note (Signed)
Palliative Medicine Inpatient Consult Follow Up Note   Name: Caleb Francis Date: 12/11/2014 MRN: [id number]  DOB: 1946/05/06  Referring Physician: Bettey Costa, MD  Palliative Care consult requested for this 69 y.o. male for goals of medical therapy in patient with mantle cell lymphoma, getting chemotherapy, admitted with altered mental status  Caleb Francis is lying in bed in CCU. Awakens to voice, tries to answer questions. More alert than yesterday.   REVIEW OF SYSTEMS:  Patient is not able to provide ROS  CODE STATUS: DNR   PAST MEDICAL HISTORY: Past Medical History  Diagnosis Date  . Arthritis   . Hypertension   . RA (rheumatoid arthritis)   . Anemia   . Cancer     lymphoma  . History of nuclear stress test     a. 12/2013: low risk, no sig ischemia, no EKG changes, no artifact, EF 63%  . Chronic kidney disease     PAST SURGICAL HISTORY:  Past Surgical History  Procedure Laterality Date  . Back surgery    . Fracture surgery      ankle  . Portacath placement    . Cystoscopy w/ ureteral stent placement Left 10/24/2014    Procedure: CYSTOSCOPY WITH RETROGRADE PYELOGRAM/URETERAL STENT PLACEMENT;  Surgeon: Irine Seal, MD;  Location: [facility] ORS;  Service: Urology;  Laterality: Left;    Vital Signs: BP 83/52 mmHg  Pulse 109  Temp(Src) 97.8 F (36.6 C) (Axillary)  Resp 18  Ht 6' (1.829 m)  Wt 69.9 kg (154 lb 1.6 oz)  BMI 20.90 kg/m2  SpO2 100% Filed Weights   12/09/14 0606 12/10/14 0500 12/11/14 0700  Weight: 64.4 kg (141 lb 15.6 oz) 65.8 kg (145 lb 1 oz) 69.9 kg (154 lb 1.6 oz)    Estimated body mass index is 20.9 kg/(m^2) as calculated from the following:   Height as of this encounter: 6' (1.829 m).   Weight as of this encounter: 69.9 kg (154 lb 1.6 oz).  PHYSICAL EXAM: General: Critically ill appearing HEENT: OP clear, poor dentition Neck: Trachea midline  Cardiovascular: regular rhythm, tachycardic Pulmonary: fair air movemnt ant fields, no audible  wheeze Abdominal: firm, distended, hypoactive BS's  GU: no suprapubic tenderness Extremities: no edema BLE's Neurological: opens eyes to voice, attempts to answer questions Skin: no rashes Psychiatric: unable to assess  LABS: CBC:    Component Value Date/Time   WBC 3.6* 12/11/2014 0537   WBC 6.4 07/17/2014 1121   HGB 8.6* 12/11/2014 0537   HGB 11.8* 07/17/2014 1121   HCT 25.8* 12/11/2014 0537   HCT 35.0* 07/17/2014 1121   PLT 10* 12/11/2014 0537   PLT 263 07/17/2014 1121   MCV 87.4 12/11/2014 0537   MCV 83 07/17/2014 1121   NEUTROABS 2.8 12/11/2014 0537   NEUTROABS 3.4 07/17/2014 1121   LYMPHSABS 0.4* 12/11/2014 0537   LYMPHSABS 1.9 07/17/2014 1121   MONOABS 0.4 12/11/2014 0537   MONOABS 0.8 07/17/2014 1121   EOSABS 0.0 12/11/2014 0537   EOSABS 0.2 07/17/2014 1121   BASOSABS 0.0 12/11/2014 0537   BASOSABS 0.0 07/17/2014 1121   Comprehensive Metabolic Panel:    Component Value Date/Time   NA 145 12/11/2014 0537   NA 141 07/17/2014 1121   K 3.2* 12/11/2014 0537   K 2.8* 07/17/2014 1121   CL 115* 12/11/2014 0537   CL 100 07/17/2014 1121   CO2 24 12/11/2014 0537   CO2 32 07/17/2014 1121   BUN 49* 12/11/2014 0537   BUN 9 07/17/2014 1121  CREATININE 2.85* 12/11/2014 0537   CREATININE 1.09 07/17/2014 1121   GLUCOSE 161* 12/11/2014 0537   GLUCOSE 105* 07/17/2014 1121   CALCIUM 7.8* 12/11/2014 0537   CALCIUM 8.9 07/17/2014 1121   AST 28 12/06/2014 0437   AST 17 07/17/2014 1121   ALT 20 12/06/2014 0437   ALT 12* 07/17/2014 1121   ALKPHOS 141* 12/06/2014 0437   ALKPHOS 79 07/17/2014 1121   BILITOT 2.2* 12/06/2014 0437   PROT 5.9* 12/06/2014 0437   PROT 7.3 07/17/2014 1121   ALBUMIN 2.2* 12/06/2014 0437   ALBUMIN 3.1* 07/17/2014 1121    IMPRESSION: Caleb Francis is a 69 yo man with PMH of mantle cell lymphoma dx 02/2013 on salvage chemo, HTN, RA, 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 He was hospitalzed 5/27-11/19/14 with abd pain, sepsis, A/CKD. He was  discharged to SNF and continued with chemotherapy. He was readmitted 11/29/14 with altered mental status. Urine and blood cx's + for Klebsiella. Mental status remains poor. MRI without contrast shows no acute change. EEG c/w metabolic encephalopathy. Developed hypernatremia which has corrected. Mental status waxes and wanes.  Pt is more responsive this AM. No longer neutropenic so RN to give enema and disimpact for severe constipation. Still requiring platelet transfusions (10K today), Hgb stable.   PLAN: As above  More than 50% of the visit was spent in counseling/coordination of care: YES  Time spent: 25 minutes

## 2014-12-11 NOTE — Progress Notes (Signed)
Subjective:  Remains critically ill sitter at bedside UOP 750 cc Na level improved  Objective:  Vital signs in last 24 hours:  Temp:  [97.5 F (36.4 C)-98.2 F (36.8 C)] 97.8 F (36.6 C) (06/29 0800) Pulse Rate:  [99-114] 109 (06/29 0800) Resp:  [16-26] 18 (06/29 0800) BP: (78-131)/(52-84) 83/52 mmHg (06/29 0800) SpO2:  [97 %-100 %] 100 % (06/29 0800) Weight:  [69.9 kg (154 lb 1.6 oz)] 69.9 kg (154 lb 1.6 oz) (06/29 0700)  Weight change: 4.1 kg (9 lb 0.6 oz) Filed Weights   12/09/14 0606 12/10/14 0500 12/11/14 0700  Weight: 64.4 kg (141 lb 15.6 oz) 65.8 kg (145 lb 1 oz) 69.9 kg (154 lb 1.6 oz)    Intake/Output: I/O last 3 completed shifts: In: 7 [I.V.:4640; Blood:822; IV [id number]:259] Out: 5638 [Urine:3425; Emesis/NG output:400]   Intake/Output this shift:     Physical Exam: General: lethargic, ill appearing  Head:  NG in place  Eyes: Anicteric  Neck: Supple, trachea midline  Lungs:  Clear to auscultation normal effort  Heart: S1S2 tachycardic  Abdomen:  Soft, significantly distended, mild diffuse tenderness,   Extremities: no peripheral edema  Neurologic:  able to verbalize at times,   Skin: No acute rashes  Access:   Foley with clear urine  Basic Metabolic Panel:  Recent Labs Lab 12/06/14 0437 12/07/14 0555 12/08/14 0530 12/09/14 0509 12/09/14 1242 12/10/14 0500 12/10/14 2045 12/11/14 0537  NA 154* 156* 155* 153*  --  149*  --  145  K 4.2 4.4 3.9 3.1* 3.6 3.2* 3.2* 3.2*  CL 130* 125* 126* 125*  --  120*  --  115*  CO2 18* 21* 20* 22  --  24  --  24  GLUCOSE 208* 253* 184* 131*  --  132*  --  161*  BUN 42* 46* 64* 53*  --  41*  --  49*  CREATININE 2.04* 2.57* 2.63* 2.25*  --  1.92*  --  2.85*  CALCIUM 8.6* 8.9 8.5* 8.5*  --  8.6*  --  7.8*  MG 1.9 2.3 2.4 2.1  --  1.9  --   --   PHOS 2.4* 4.0 6.0* 3.2  --  3.1  --   --     Liver Function Tests:  Recent Labs Lab 12/06/14 0437  AST 28  ALT 20  ALKPHOS 141*  BILITOT 2.2*  PROT 5.9*   ALBUMIN 2.2*   No results for input(s): LIPASE, AMYLASE in the last 168 hours. No results for input(s): AMMONIA in the last 168 hours.  CBC:  Recent Labs Lab 12/08/14 0530 12/08/14 1526 12/09/14 1242 12/10/14 0500 12/11/14 0537  WBC 0.6* 0.9* 1.3* 2.1* 3.6*  NEUTROABS  --   --   --  0.9* 2.8  HGB 8.5* 7.9* 9.3* 8.9* 8.6*  HCT 25.2* 23.7* 27.8* 26.5* 25.8*  MCV 84.2 84.2 85.9 86.2 87.4  PLT 34* 16* 7* 13* 10*    Cardiac Enzymes: No results for input(s): CKTOTAL, CKMB, CKMBINDEX, TROPONINI in the last 168 hours.  BNP: Invalid input(s): POCBNP  CBG:  Recent Labs Lab 12/10/14 0555 12/10/14 1224 12/10/14 1823 12/10/14 2351 12/11/14 0558  GLUCAP 109* 177* 96 152* 142*    Microbiology: Results for orders placed or performed during the hospital encounter of 11/29/14  MRSA PCR Screening     Status: None   Collection Time: 11/29/14 12:13 PM  Result Value Ref Range Status   MRSA by PCR NEGATIVE NEGATIVE Final    Comment:  The GeneXpert MRSA Assay (FDA approved for NASAL specimens only), is one component of a comprehensive MRSA colonization surveillance program. It is not intended to diagnose MRSA infection nor to guide or monitor treatment for MRSA infections.   Culture, blood (routine x 2)     Status: None   Collection Time: 11/29/14  1:19 PM  Result Value Ref Range Status   Specimen Description BLOOD  Final   Special Requests Immunocompromised  Final   Culture NO GROWTH 5 DAYS  Final   Report Status 12/04/2014 FINAL  Final  Urine culture     Status: None   Collection Time: 11/30/14 10:12 AM  Result Value Ref Range Status   Specimen Description URINE, CATHETERIZED  Final   Special Requests Immunocompromised  Final   Culture NO GROWTH 2 DAYS  Final   Report Status 12/02/2014 FINAL  Final  Culture, blood (routine x 2)     Status: None   Collection Time: 12/06/14  8:08 PM  Result Value Ref Range Status   Specimen Description BLOOD  Final   Special  Requests NONE  Final   Culture  Setup Time   Final    GRAM POSITIVE COCCI IN BOTH AEROBIC AND ANAEROBIC BOTTLES CRITICAL RESULT CALLED TO, READ BACK BY AND VERIFIED WITH: JENNIFER BEZARD AT 1610 12/08/14.PMH CONFIRMED BY [facility]    Culture   Final    STAPHYLOCOCCUS AURICULARIS IN BOTH AEROBIC AND ANAEROBIC BOTTLES    Report Status 12/11/2014 FINAL  Final   Organism ID, Bacteria STAPHYLOCOCCUS AURICULARIS  Final      Susceptibility   Staphylococcus auricularis - MIC*    CIPROFLOXACIN >=8 RESISTANT Resistant     ERYTHROMYCIN >=8 RESISTANT Resistant     GENTAMICIN <=0.5 SENSITIVE Sensitive     OXACILLIN >=4 RESISTANT Resistant     TETRACYCLINE <=1 SENSITIVE Sensitive     VANCOMYCIN 1 SENSITIVE Sensitive     CLINDAMYCIN <=0.25 SENSITIVE Sensitive     TRIMETH/SULFA Value in next row Sensitive      SENSITIVE<=20    LEVOFLOXACIN Value in next row Intermediate      INTERMEDIATE4    * STAPHYLOCOCCUS AURICULARIS  Culture, blood (routine x 2)     Status: None (Preliminary result)   Collection Time: 12/06/14  8:40 PM  Result Value Ref Range Status   Specimen Description BLOOD  Final   Special Requests NONE  Final   Culture NO GROWTH 4 DAYS  Final   Report Status PENDING  Incomplete  Culture, blood (routine x 2)     Status: None (Preliminary result)   Collection Time: 12/08/14 11:40 AM  Result Value Ref Range Status   Specimen Description BLOOD  Final   Special Requests Normal  Final   Culture NO GROWTH 2 DAYS  Final   Report Status PENDING  Incomplete  Culture, blood (routine x 2)     Status: None (Preliminary result)   Collection Time: 12/08/14 11:49 AM  Result Value Ref Range Status   Specimen Description BLOOD  Final   Special Requests Normal  Final   Culture NO GROWTH 2 DAYS  Final   Report Status PENDING  Incomplete    Coagulation Studies: No results for input(s): LABPROT, INR in the last 72 hours.  Urinalysis: No results for input(s): COLORURINE, LABSPEC, PHURINE, GLUCOSEU,  HGBUR, BILIRUBINUR, KETONESUR, PROTEINUR, UROBILINOGEN, NITRITE, LEUKOCYTESUR in the last 72 hours.  Invalid input(s): APPERANCEUR    Imaging: Ct Abdomen Pelvis Wo Contrast  12/10/2014   CLINICAL DATA:  Constipation and abdominal distension. History of lymphoma.  EXAM: CT ABDOMEN AND PELVIS WITHOUT CONTRAST  TECHNIQUE: Multidetector CT imaging of the abdomen and pelvis was performed following the standard protocol without IV contrast.  COMPARISON:  CT scan 11/25/2014  FINDINGS: Lower chest: The heart is enlarged but stable. Small amount pericardial fluid is stable. There is an NG tube coursing down the esophagus and into the stomach. Mild diffuse esophageal wall thickening. There is a right pleural effusion and right lower lobe infiltrate. Aspiration would be a consideration. The left lung base is clear.  Hepatobiliary: No focal hepatic lesions or intrahepatic biliary dilatation numerous gallstones are noted in the gallbladder. No common bile duct dilatation.  Pancreas: Moderate pancreatic atrophy but no mass, inflammation or duct dilatation.  Spleen: Normal size.  No focal lesions.  Adrenals/Urinary Tract: The adrenal glands and kidneys are stable. There is a double-J ureteral stent noted on the left side. No hydronephrosis.  Stomach/Bowel: The stomach, duodenum and small bowel are grossly normal without oral contrast no inflammatory changes, mass lesions or obstructive findings. The colon is distended with air and stool all the way down to the rectum which contains contrast material likely from the prior CT scan. Findings suggest constipation and fecal impaction in the rectum. Suspect benign pneumatosis coli involving the right colon.  Vascular/Lymphatic: Advanced atherosclerotic calcifications involving the aorta and branch vessels. No aneurysm. Bulky retroperitoneal and pelvic lymphadenopathy not significantly change when compartment to prior recent study.  Other: Foley catheter noted in the bladder.  Bulky pelvic sidewall adenopathy. Bilateral inguinal adenopathy.  Musculoskeletal: No significant bony findings. Moderate osteoporosis.  IMPRESSION: 1. Large amount of air in stool throughout the colon and down into the rectum suggesting constipation and fecal impaction. 2. Stable bulky retrocrural, retroperitoneal, pelvic and inguinal adenopathy consistent with known lymphoma. No significant interval change since the prior study from 11/25/2014. 3. Cholelithiasis. 4. Right pleural effusion and right lower lobe infiltrate.   Electronically Signed   By: Marijo Sanes M.D.   On: 12/10/2014 18:24   Dg Abd 1 View  12/11/2014   CLINICAL DATA:  Ileus follow-up  EXAM: ABDOMEN - 1 VIEW  COMPARISON:  CT scan of the chest as well as KUB of December 10, 2014  FINDINGS: There remains significant gaseous distention of much of the colon. There is contrast within the distal sigmoid and in the rectum. No free extraluminal gas collections are observed. The nasogastric tube has been advanced. The left ureteral stent is unchanged in position.  IMPRESSION: Stable appearance of the colon with gaseous distention compatible with diffuse colonic ileus. Contrast remains in the rectosigmoid.   Electronically Signed   By: David  Martinique M.D.   On: 12/11/2014 07:43   Dg Abd 1 View  12/10/2014   CLINICAL DATA:  Ileus  EXAM: ABDOMEN - 1 VIEW  COMPARISON:  12/09/2014  FINDINGS: Left ureteral stent appears unchanged in position. Nasogastric tube extends into the stomach. Gaseous distention of the colon persists without significant change. No free air is evident.  IMPRESSION: Unchanged gaseous distention of the colon.   Electronically Signed   By: Andreas Newport M.D.   On: 12/10/2014 07:03   Dg Abd 1 View  12/09/2014   CLINICAL DATA:  Ileus.  EXAM: ABDOMEN - 1 VIEW  COMPARISON:  12/08/2014  FINDINGS: Left ureteral stent remains in place, unchanged. Oral contrast material in the rectosigmoid colon, stable. NG tube in stable position with the  tip in the distal stomach.  Mild gaseous distension  of the right colon and transverse colon, similar to prior study. No new bowel dilatation. No free air organomegaly.  IMPRESSION: Stable exam.   Electronically Signed   By: Rolm Baptise M.D.   On: 12/09/2014 13:08     Medications:   . dextrose 5 % with KCl 20 mEq / L 20 mEq (12/11/14 0600)   . sodium chloride   Intravenous Once  . acetaminophen  650 mg Oral Once  . antiseptic oral rinse  7 mL Mouth Rinse BID  . aspirin  324 mg Oral Once  . bisacodyl  10 mg Rectal Daily  . cefTAZidime (FORTAZ)  IV  2 g Intravenous Q24H  . diphenhydrAMINE  25 mg Intravenous Once  . erythromycin  250 mg Intravenous 3 times per day  . filgrastim  480 mcg Subcutaneous Once  . filgrastim  480 mcg Subcutaneous Daily  . insulin aspart  1-3 Units Subcutaneous Q6H  . lactulose  30 g Oral BID  . methylPREDNISolone (SOLU-MEDROL) injection  60 mg Intravenous Once  . metronidazole  500 mg Intravenous Q8H  . mometasone-formoterol  2 puff Inhalation BID  . potassium chloride  20 mEq Oral BID  . small volume/piggyback builder   Intravenous Once  . scopolamine  1 patch Transdermal Q72H  . sennosides  10 mL Oral BID  . sodium phosphate  1 enema Rectal Once  . thiamine IV  100 mg Intravenous Daily  . Vancomycin  750 mg Intravenous Q24H  . ziprasidone  10 mg Intramuscular QHS   acetaminophen **OR** acetaminophen, haloperidol lactate, heparin lock flush, magnesium hydroxide, metoprolol, morphine injection, ondansetron (ZOFRAN) IV, sodium chloride, ziprasidone  Assessment/ Plan:  39 AAM with progressive mantle cell lymphoma, RICE chemo in 11/2014, hx left sided hydronephrosis and hydroureter. S/p ureteral stent placement    1.  Acute renal failure due to ATN. 2.  Sepsis with klebsiella oxytoca. 3.  Hypernatremia.  4.  Hypokalemia.  Plan:  S Cr has worsened again with dropping UOP   Na in normal range now, so decrease dose of D5W Replace Kcl prn Leukopenia and  thrombocytopenia noted, mgmt per IM/oncology team   LOS: 12 Shaquil Aldana 6/29/20169:32 AM

## 2014-12-12 ENCOUNTER — Encounter: Payer: Medicare Other | Admitting: Cardiovascular Disease

## 2014-12-12 ENCOUNTER — Inpatient Hospital Stay: Payer: Medicare Other

## 2014-12-12 ENCOUNTER — Encounter: Payer: Self-pay | Admitting: *Deleted

## 2014-12-12 DIAGNOSIS — R140 Abdominal distension (gaseous): Secondary | ICD-10-CM

## 2014-12-12 DIAGNOSIS — E43 Unspecified severe protein-calorie malnutrition: Secondary | ICD-10-CM | POA: Insufficient documentation

## 2014-12-12 LAB — GLUCOSE, CAPILLARY
Glucose-Capillary: 150 mg/dL — ABNORMAL HIGH (ref 65–99)
Glucose-Capillary: 164 mg/dL — ABNORMAL HIGH (ref 65–99)
Glucose-Capillary: 171 mg/dL — ABNORMAL HIGH (ref 65–99)
Glucose-Capillary: 81 mg/dL (ref 65–99)

## 2014-12-12 LAB — PREPARE PLATELET PHERESIS: Unit division: 0

## 2014-12-12 LAB — CBC WITH DIFFERENTIAL/PLATELET
BAND NEUTROPHILS: 7 % (ref 0–10)
BASOS PCT: 0 % (ref 0–1)
Basophils Absolute: 0 10*3/uL (ref 0.0–0.1)
Blasts: 0 %
EOS ABS: 0.1 10*3/uL (ref 0.0–0.7)
EOS PCT: 2 % (ref 0–5)
HEMATOCRIT: 23.8 % — AB (ref 40.0–52.0)
HEMOGLOBIN: 7.9 g/dL — AB (ref 13.0–18.0)
Lymphocytes Relative: 18 % (ref 12–46)
Lymphs Abs: 1.2 10*3/uL (ref 0.7–4.0)
MCH: 29 pg (ref 26.0–34.0)
MCHC: 33.1 g/dL (ref 32.0–36.0)
MCV: 87.7 fL (ref 80.0–100.0)
Metamyelocytes Relative: 1 %
Monocytes Absolute: 0.1 10*3/uL (ref 0.1–1.0)
Monocytes Relative: 2 % — ABNORMAL LOW (ref 3–12)
Myelocytes: 0 %
NEUTROS PCT: 70 % (ref 43–77)
Neutro Abs: 5 10*3/uL (ref 1.7–7.7)
OTHER: 0 %
PLATELETS: 16 10*3/uL — AB (ref 150–440)
Promyelocytes Absolute: 0 %
RBC: 2.71 MIL/uL — AB (ref 4.40–5.90)
RDW: 18.2 % — ABNORMAL HIGH (ref 11.5–14.5)
WBC: 6.4 10*3/uL (ref 3.8–10.6)
nRBC: 0 /100 WBC

## 2014-12-12 LAB — COMPREHENSIVE METABOLIC PANEL
ALBUMIN: 2.1 g/dL — AB (ref 3.5–5.0)
ALT: 9 U/L — ABNORMAL LOW (ref 17–63)
ANION GAP: 7 (ref 5–15)
AST: 13 U/L — ABNORMAL LOW (ref 15–41)
Alkaline Phosphatase: 112 U/L (ref 38–126)
BILIRUBIN TOTAL: 0.7 mg/dL (ref 0.3–1.2)
BUN: 60 mg/dL — ABNORMAL HIGH (ref 6–20)
CO2: 24 mmol/L (ref 22–32)
CREATININE: 3.73 mg/dL — AB (ref 0.61–1.24)
Calcium: 7.8 mg/dL — ABNORMAL LOW (ref 8.9–10.3)
Chloride: 112 mmol/L — ABNORMAL HIGH (ref 101–111)
GFR calc Af Amer: 18 mL/min — ABNORMAL LOW (ref >60)
GFR calc non Af Amer: 15 mL/min — ABNORMAL LOW (ref >60)
Glucose, Bld: 177 mg/dL — ABNORMAL HIGH (ref 65–99)
Potassium: 3.2 mmol/L — ABNORMAL LOW (ref 3.5–5.1)
Sodium: 143 mmol/L (ref 135–145)
TOTAL PROTEIN: 5.2 g/dL — AB (ref 6.5–8.1)

## 2014-12-12 LAB — PREALBUMIN: Prealbumin: 9.8 mg/dL — ABNORMAL LOW (ref 18–38)

## 2014-12-12 LAB — PHOSPHORUS: Phosphorus: 5 mg/dL — ABNORMAL HIGH (ref 2.5–4.6)

## 2014-12-12 LAB — CREATININE, SERUM
Creatinine, Ser: 3.91 mg/dL — ABNORMAL HIGH (ref 0.61–1.24)
GFR calc Af Amer: 17 mL/min — ABNORMAL LOW (ref >60)
GFR calc non Af Amer: 15 mL/min — ABNORMAL LOW (ref >60)

## 2014-12-12 LAB — MAGNESIUM: MAGNESIUM: 2.1 mg/dL (ref 1.7–2.4)

## 2014-12-12 LAB — TRIGLYCERIDES: Triglycerides: 139 mg/dL (ref <150)

## 2014-12-12 LAB — POTASSIUM: Potassium: 3.2 mmol/L — ABNORMAL LOW (ref 3.5–5.1)

## 2014-12-12 MED ORDER — SODIUM CHLORIDE 0.9 % IV SOLN
250.0000 mL | Freq: Once | INTRAVENOUS | Status: AC
  Administered 2014-12-12: 250 mL via INTRAVENOUS

## 2014-12-12 MED ORDER — HEPARIN SOD (PORK) LOCK FLUSH 100 UNIT/ML IV SOLN
500.0000 [IU] | Freq: Every day | INTRAVENOUS | Status: AC | PRN
  Administered 2014-12-18: 500 [IU]
  Filled 2014-12-12: qty 5

## 2014-12-12 MED ORDER — ACETAMINOPHEN 325 MG PO TABS
650.0000 mg | ORAL_TABLET | Freq: Once | ORAL | Status: AC
  Administered 2014-12-12: 650 mg via ORAL
  Filled 2014-12-12: qty 2

## 2014-12-12 MED ORDER — SODIUM CHLORIDE 0.9 % IJ SOLN
10.0000 mL | INTRAMUSCULAR | Status: AC | PRN
  Administered 2014-12-27: 10 mL

## 2014-12-12 MED ORDER — DIPHENHYDRAMINE HCL 50 MG/ML IJ SOLN
25.0000 mg | Freq: Once | INTRAMUSCULAR | Status: AC
  Administered 2014-12-12: 25 mg via INTRAVENOUS
  Filled 2014-12-12: qty 1

## 2014-12-12 MED ORDER — POTASSIUM CHLORIDE 2 MEQ/ML IV SOLN
Freq: Once | INTRAVENOUS | Status: AC
  Administered 2014-12-12: 19:00:00 via INTRAVENOUS
  Filled 2014-12-12: qty 20

## 2014-12-12 MED ORDER — SODIUM CHLORIDE 0.9 % IJ SOLN
3.0000 mL | INTRAMUSCULAR | Status: AC | PRN
  Administered 2015-01-07: 3 mL

## 2014-12-12 MED ORDER — TRACE MINERALS CR-CU-MN-SE-ZN 10-1000-500-60 MCG/ML IV SOLN
INTRAVENOUS | Status: AC
  Administered 2014-12-12: 17:00:00 via INTRAVENOUS
  Filled 2014-12-12: qty 1200

## 2014-12-12 MED ORDER — HEPARIN SOD (PORK) LOCK FLUSH 100 UNIT/ML IV SOLN
250.0000 [IU] | INTRAVENOUS | Status: DC | PRN
  Filled 2014-12-12: qty 3

## 2014-12-12 MED ORDER — POTASSIUM CHLORIDE 2 MEQ/ML IV SOLN
Freq: Once | INTRAVENOUS | Status: AC
  Administered 2014-12-12: 09:00:00 via INTRAVENOUS
  Filled 2014-12-12: qty 20

## 2014-12-12 NOTE — Progress Notes (Signed)
Nutrition Follow-up  DOCUMENTATION CODES:  Severe malnutrition in context of acute illness/injury  INTERVENTION:   (Coordination of Care) Recommend increasing TPN to 10ml/hr today, discussed in ICU rounds with Dr. Mortimer Fries and spoke with Dr. Candiss Norse via phone regarding poc.  Dr. Candiss Norse planning to discontinue D5 IV fluids at this time with increase in TPN to start at 1800 today.   NUTRITION DIAGNOSIS:  Inadequate oral intake related to inability to eat as evidenced by NPO status Being addressed with TPN    GOAL:  Patient will meet greater than or equal to 90% of their needs    MONITOR:   (Energy Intake, Digestive System, Electrolyte/Renal Profile, Anthropometrics, Anemia Profile)  REASON FOR ASSESSMENT:  Consult New TPN/TNA  ASSESSMENT:  Diet order: NPO with inadequate nutrition since admission on 6/17  Digestive system: abdomen distended, large soft BM noted this am, rectal tube being put in place this pm for decompression for colonic ileus NG tube output 1030ml  Access: portacath Medications: erythromycin, lactulose, duloclax suppository, bethanecol  Electrolyte/Renal Profile and Glucose Profile:   Recent Labs Lab 12/09/14 0509  12/10/14 0500  12/11/14 0537 12/11/14 1723 12/12/14 0535  NA 153*  --  149*  --  145  --  143  K 3.1*  < > 3.2*  < > 3.2* 3.5 3.2*  CL 125*  --  120*  --  115*  --  112*  CO2 22  --  24  --  24  --  24  BUN 53*  --  41*  --  49*  --  60*  CREATININE 2.25*  --  1.92*  --  2.85*  --  3.73*  CALCIUM 8.5*  --  8.6*  --  7.8*  --  7.8*  MG 2.1  --  1.9  --  2.0  --  2.1  PHOS 3.2  --  3.1  --   --   --  5.0*  GLUCOSE 131*  --  132*  --  161*  --  177*  < > = values in this interval not displayed. Protein Profile:  Recent Labs Lab 12/06/14 0437 12/12/14 0535  ALBUMIN 2.2* 2.1*   Urine output: 746ml last 24 hr   Weight Trend since Admission: Filed Weights   12/10/14 0500 12/11/14 0700 12/12/14 0500  Weight: 145 lb 1 oz (65.8 kg)  154 lb 1.6 oz (69.9 kg) 148 lb 13 oz (67.5 kg)      Height:  Ht Readings from Last 1 Encounters:  11/29/14 6' (1.829 m)    Weight:  Wt Readings from Last 1 Encounters:  12/12/14 148 lb 13 oz (67.5 kg)      BMI:  Body mass index is 20.18 kg/(m^2).  Estimated Nutritional Needs:  Kcal:  1994-2357kcals, BEE: 1511kcals, TEE: IF 1.1-1.3)(AF 1.2)  Protein:  74-88g protein (1.0-1.2g/kg)  Fluid:  1838-2286mL of fluid (25-17mL/kg)   Diet Order:  Diet NPO time specified .TPN (CLINIMIX-E) Adult .TPN (CLINIMIX-E) Adult  EDUCATION NEEDS:  No education needs identified at this time    Lewis. Zenia Resides, [place], [place] (pager)

## 2014-12-12 NOTE — Progress Notes (Signed)
[facility] Hematology/Oncology Progress Note  Date of admission: 11/29/2014  Hospital day:  12/12/2014  Chief Complaint: Caleb Francis is a 69 y.o. 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  Subjective:  Awake.  Answers a few questions appropriately. Follows commands.  Social History: The patient is alone today.  Allergies: No Known Allergies  Scheduled Medications: . antiseptic oral rinse  7 mL Mouth Rinse BID  . aspirin  324 mg Oral Once  . bisacodyl  10 mg Rectal Daily  . erythromycin  250 mg Intravenous 3 times per day  . insulin aspart  1-3 Units Subcutaneous Q6H  . lactulose  30 g Oral BID  . methylPREDNISolone (SOLU-MEDROL) injection  60 mg Intravenous Once  . mometasone-formoterol  2 puff Inhalation BID  . potassium chloride  20 mEq Oral BID  . scopolamine  1 patch Transdermal Q72H  . sennosides  10 mL Oral BID  . thiamine IV  100 mg Intravenous Daily  . ziprasidone  10 mg Intramuscular QHS   Review of Systems: GENERAL: More interactive. Denies pain or shortness of breath.  No fevers. PERFORMANCE STATUS (ECOG): 4 Pain: Denies pain.  Physical Exam: Blood pressure 112/73, pulse 113, temperature 97.5 F (36.4 C), temperature source Oral, resp. rate 28, height 6' (1.829 m), weight 148 lb 13 oz (67.5 kg), SpO2 100 %.  GENERAL: Chronically ill appearing gentleman lying in bed in the ICU. MENTAL STATUS:Open eyes with prompting. Answers questions appropriately. HEAD: Thin short graying hair. Normocephalic, atraumatic, face symmetric, no Cushingoid features. EYES: Brown eyes. Pupils equal round and reactive to light and accomodation. No conjunctivitis or scleral icterus. ENT: NG tube in place draining dark black liquid. Oropharynx clear. Poor dentition.  RESPIRATORY: Upper airway sounds.  Decreased breath sounds at the bases.  No  rales, wheezes or rhonchi. CARDIOVASCULAR: Regular rate and rhythm without murmur, rub or gallop. ABDOMEN: Distended.  Soft, minimal bowel sounds, and no hepatosplenomegaly. No tenderness. No guarding or rebound tenderness.  No masses.  Rectal tube in place with brown stool SKIN: No rashes, ulcers or lesions. EXTREMITIES: No edema, no skin discoloration or tenderness. No palpable cords. NEUROLOGICAL:More interactive. Follows commands (squeezes with both hands and wiggles toes).  Results for orders placed or performed during the hospital encounter of 11/29/14 (from the past 48 hour(s))  Glucose, capillary     Status: Abnormal   Collection Time: 12/10/14 11:51 PM  Result Value Ref Range   Glucose-Capillary 152 (H) 65 - 99 mg/dL  CBC with Differential     Status: Abnormal   Collection Time: 12/11/14  5:37 AM  Result Value Ref Range   WBC 3.6 (L) 3.8 - 10.6 K/uL   RBC 2.95 (L) 4.40 - 5.90 MIL/uL   Hemoglobin 8.6 (L) 13.0 - 18.0 g/dL   HCT 25.8 (L) 40.0 - 52.0 %   MCV 87.4 80.0 - 100.0 fL   MCH 29.1 26.0 - 34.0 pg   MCHC 33.3 32.0 - 36.0 g/dL   RDW 18.0 (H) 11.5 - 14.5 %   Platelets 10 (LL) 150 - 440 K/uL    Comment: CRITICAL VALUE NOTED.  VALUE IS CONSISTENT WITH PREVIOUSLY REPORTED AND CALLED VALUE. RESULT REPEATED AND VERIFIED TSH 12/11/14    Neutrophils Relative % 62 43 - 77 %   Lymphocytes Relative 12 12 - 46 %   Monocytes Relative 10 3 - 12 %  Eosinophils Relative 0 0 - 5 %   Basophils Relative 0 0 - 1 %   Band Neutrophils 15 (H) 0 - 10 %   Metamyelocytes Relative 1 %   Myelocytes 0 %   Promyelocytes Absolute 0 %   Blasts 0 %   nRBC 0 0 /100 WBC   Neutro Abs 2.8 1.7 - 7.7 K/uL   Lymphs Abs 0.4 (L) 0.7 - 4.0 K/uL   Monocytes Absolute 0.4 0.1 - 1.0 K/uL   Eosinophils Absolute 0.0 0.0 - 0.7 K/uL   Basophils Absolute 0.0 0.0 - 0.1 K/uL  Basic metabolic panel     Status: Abnormal   Collection Time: 12/11/14  5:37 AM  Result Value Ref Range   Sodium 145 135 - 145  mmol/L   Potassium 3.2 (L) 3.5 - 5.1 mmol/L   Chloride 115 (H) 101 - 111 mmol/L   CO2 24 22 - 32 mmol/L   Glucose, Bld 161 (H) 65 - 99 mg/dL   BUN 49 (H) 6 - 20 mg/dL   Creatinine, Ser 2.85 (H) 0.61 - 1.24 mg/dL   Calcium 7.8 (L) 8.9 - 10.3 mg/dL   GFR calc non Af Amer 21 (L) >60 mL/min   GFR calc Af Amer 25 (L) >60 mL/min    Comment: (NOTE) The eGFR has been calculated using the CKD EPI equation. This calculation has not been validated in all clinical situations. eGFR's persistently <60 mL/min signify possible Chronic Kidney Disease.    Anion gap 6 5 - 15  Magnesium     Status: None   Collection Time: 12/11/14  5:37 AM  Result Value Ref Range   Magnesium 2.0 1.7 - 2.4 mg/dL  Glucose, capillary     Status: Abnormal   Collection Time: 12/11/14  5:58 AM  Result Value Ref Range   Glucose-Capillary 142 (H) 65 - 99 mg/dL  Prepare Pheresed Platelets     Status: None   Collection Time: 12/11/14  9:09 AM  Result Value Ref Range   Unit Number [id number]    Blood Component Type PLTP LI1 PAS    Unit division 00    Status of Unit ISSUED,FINAL    Transfusion Status OK TO TRANSFUSE   Glucose, capillary     Status: Abnormal   Collection Time: 12/11/14 11:45 AM  Result Value Ref Range   Glucose-Capillary 144 (H) 65 - 99 mg/dL  Potassium     Status: None   Collection Time: 12/11/14  5:23 PM  Result Value Ref Range   Potassium 3.5 3.5 - 5.1 mmol/L  Glucose, capillary     Status: None   Collection Time: 12/11/14  5:49 PM  Result Value Ref Range   Glucose-Capillary 74 65 - 99 mg/dL   Comment 1 Document in Chart   Glucose, capillary     Status: None   Collection Time: 12/11/14  6:16 PM  Result Value Ref Range   Glucose-Capillary 92 65 - 99 mg/dL  Glucose, capillary     Status: Abnormal   Collection Time: 12/12/14 12:23 AM  Result Value Ref Range   Glucose-Capillary 150 (H) 65 - 99 mg/dL  CBC with Differential     Status: Abnormal   Collection Time: 12/12/14  5:35 AM  Result  Value Ref Range   WBC 6.4 3.8 - 10.6 K/uL   RBC 2.71 (L) 4.40 - 5.90 MIL/uL   Hemoglobin 7.9 (L) 13.0 - 18.0 g/dL   HCT 23.8 (L) 40.0 - 52.0 %   MCV 87.7  80.0 - 100.0 fL   MCH 29.0 26.0 - 34.0 pg   MCHC 33.1 32.0 - 36.0 g/dL   RDW 18.2 (H) 11.5 - 14.5 %   Platelets 16 (LL) 150 - 440 K/uL    Comment: RESULT REPEATED AND VERIFIED CRITICAL VALUE NOTED.  VALUE IS CONSISTENT WITH PREVIOUSLY REPORTED AND CALLED VALUE.    Neutrophils Relative % 70 43 - 77 %   Lymphocytes Relative 18 12 - 46 %   Monocytes Relative 2 (L) 3 - 12 %   Eosinophils Relative 2 0 - 5 %   Basophils Relative 0 0 - 1 %   Band Neutrophils 7 0 - 10 %   Metamyelocytes Relative 1 %   Myelocytes 0 %   Promyelocytes Absolute 0 %   Blasts 0 %   nRBC 0 0 /100 WBC   Other 0 %   Neutro Abs 5.0 1.7 - 7.7 K/uL   Lymphs Abs 1.2 0.7 - 4.0 K/uL   Monocytes Absolute 0.1 0.1 - 1.0 K/uL   Eosinophils Absolute 0.1 0.0 - 0.7 K/uL   Basophils Absolute 0.0 0.0 - 0.1 K/uL   RBC Morphology POLYCHROMASIA PRESENT    WBC Morphology VACUOLATED NEUTROPHILS   Comprehensive metabolic panel     Status: Abnormal   Collection Time: 12/12/14  5:35 AM  Result Value Ref Range   Sodium 143 135 - 145 mmol/L   Potassium 3.2 (L) 3.5 - 5.1 mmol/L   Chloride 112 (H) 101 - 111 mmol/L   CO2 24 22 - 32 mmol/L   Glucose, Bld 177 (H) 65 - 99 mg/dL   BUN 60 (H) 6 - 20 mg/dL   Creatinine, Ser 3.73 (H) 0.61 - 1.24 mg/dL   Calcium 7.8 (L) 8.9 - 10.3 mg/dL   Total Protein 5.2 (L) 6.5 - 8.1 g/dL   Albumin 2.1 (L) 3.5 - 5.0 g/dL   AST 13 (L) 15 - 41 U/L   ALT 9 (L) 17 - 63 U/L   Alkaline Phosphatase 112 38 - 126 U/L   Total Bilirubin 0.7 0.3 - 1.2 mg/dL   GFR calc non Af Amer 15 (L) >60 mL/min   GFR calc Af Amer 18 (L) >60 mL/min    Comment: (NOTE) The eGFR has been calculated using the CKD EPI equation. This calculation has not been validated in all clinical situations. eGFR's persistently <60 mL/min signify possible Chronic Kidney Disease.     Anion gap 7 5 - 15  Prealbumin     Status: Abnormal   Collection Time: 12/12/14  5:35 AM  Result Value Ref Range   Prealbumin 9.8 (L) 18 - 38 mg/dL    Comment: Performed at [facility]  Magnesium     Status: None   Collection Time: 12/12/14  5:35 AM  Result Value Ref Range   Magnesium 2.1 1.7 - 2.4 mg/dL  Phosphorus     Status: Abnormal   Collection Time: 12/12/14  5:35 AM  Result Value Ref Range   Phosphorus 5.0 (H) 2.5 - 4.6 mg/dL  Triglycerides     Status: None   Collection Time: 12/12/14  5:35 AM  Result Value Ref Range   Triglycerides 139 <150 mg/dL  Glucose, capillary     Status: Abnormal   Collection Time: 12/12/14  5:36 AM  Result Value Ref Range   Glucose-Capillary 164 (H) 65 - 99 mg/dL  Glucose, capillary     Status: Abnormal   Collection Time: 12/12/14 11:45 AM  Result Value Ref  Range   Glucose-Capillary 171 (H) 65 - 99 mg/dL  Prepare Pheresed Platelets     Status: None (Preliminary result)   Collection Time: 12/12/14 12:55 PM  Result Value Ref Range   Unit Number [id number]    Blood Component Type PLTP LI1 PAS    Unit division 00    Status of Unit ISSUED    Transfusion Status OK TO TRANSFUSE   Creatinine, serum     Status: Abnormal   Collection Time: 12/12/14  3:16 PM  Result Value Ref Range   Creatinine, Ser 3.91 (H) 0.61 - 1.24 mg/dL   GFR calc non Af Amer 15 (L) >60 mL/min   GFR calc Af Amer 17 (L) >60 mL/min    Comment: (NOTE) The eGFR has been calculated using the CKD EPI equation. This calculation has not been validated in all clinical situations. eGFR's persistently <60 mL/min signify possible Chronic Kidney Disease.   Potassium     Status: Abnormal   Collection Time: 12/12/14  3:16 PM  Result Value Ref Range   Potassium 3.2 (L) 3.5 - 5.1 mmol/L  Glucose, capillary     Status: None   Collection Time: 12/12/14  5:23 PM  Result Value Ref Range   Glucose-Capillary 81 65 - 99 mg/dL   Dg Abd 1 View  12/12/2014   CLINICAL DATA:  Ileus,  followup  EXAM: ABDOMEN - 1 VIEW  COMPARISON:  Abdomen film of 12/11/2014 and CT abdomen pelvis of 12/10/2014  FINDINGS: There is very little interval change in the considerable gaseous distention of the entire colon. In review of the CT recently, there is no evidence of distal distal colonic obstruction but a moderate amount of feces was present at that time within the colon. A left double-J ureteral stent is present and an NG tube extends below the hemidiaphragm.  IMPRESSION: No change in diffuse gaseous distention of the colon.   Electronically Signed   By: Ivar Drape M.D.   On: 12/12/2014 07:53   Dg Abd 1 View  12/11/2014   CLINICAL DATA:  Ileus follow-up  EXAM: ABDOMEN - 1 VIEW  COMPARISON:  CT scan of the chest as well as KUB of December 10, 2014  FINDINGS: There remains significant gaseous distention of much of the colon. There is contrast within the distal sigmoid and in the rectum. No free extraluminal gas collections are observed. The nasogastric tube has been advanced. The left ureteral stent is unchanged in position.  IMPRESSION: Stable appearance of the colon with gaseous distention compatible with diffuse colonic ileus. Contrast remains in the rectosigmoid.   Electronically Signed   By: David  Martinique M.D.   On: 12/11/2014 07:43    Assessment:  CHEY CHO is a 69 y.o. 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 He was admitted with Klebsiella sepsis due to pyelonephritis and associated altered mental status. He subsequently developed renal insufficiency with assciated hypernatremia. He remains in the ICU. He has some component of ICU psychosis.  He had a platelet transfusion reaction on 12/06/2014. Blood cultures from 12/06/2014 are growing staph auricularis (coag negative staph) . He had an upper GI bleed on 12/07/2014. Hematocrit has stabilized. Platelets remain low requiring transfusion support daily.  WBC and ANC are normal.  He has an  ileus and is s/p rectal tube placement today. Hypernatremia has resolved.  Renal failure has worsened.  He more interactive.  Plan: 1. Hematology/Oncology: Day 16 s/p chemotherapy. WBC is normal.  [place] 5000.  Patient no longer neutropenic.GCSF discontinued.  Platelets remain < 20,000. Platelet transfusion today.  Premed all blood products with Tylenol and Benadryl. 2. Infectious disease: Klebsiella oxytoca bacteremia treated.  Cetazidime discontinued.  Vancomycin continues until 07/09 for coag negative staph.  No evidence of endocarditis by echo. No further positive blood cultures. 3. Nephrology: Hypernatremia resolved.  Renal insufficiency worse.  Nephrology considering dialysis. 4. Neurology: Less lethargic. More interactive.  Patient with metabolic encephalopathy and ICU psychosis.   5. Gastroenterology: UGI bleed resolved. Ileus s/p rectal tube.  TPN started yesterday. 6. Disposition: Prognosis guarded.  DNR code status.  Lequita Asal, MD  12/12/2014, 11:41 PM

## 2014-12-12 NOTE — Progress Notes (Signed)
Patient more alert this shift able to follow commands by squeezing grips, speech still difficult to understand, sleeps between care.  Vital signs stable, sinus tachycardia on cardiac monitor.  Patient received 1 unit of platelet this shift with no signs of adverse reaction.  Rectal tube inserted per MD order for bowel decompression.  NG remains with total of 572ml this shift.  Foley in place and patent with 526ml of urine.  TPN increased to 21ml/hr per MD order.  Currently receiving potassium replacement. Patient currently resting in no apparent distress.  See SCM for further details.

## 2014-12-12 NOTE — Progress Notes (Signed)
Subjective:  Remains critically ill son at bedside UOP 725 cc. NG output 1050 cc S Cr is worsening significantly  Objective:  Vital signs in last 24 hours:  Temp:  [97.6 F (36.4 C)-98.9 F (37.2 C)] 98.1 F (36.7 C) (06/30 0800) Pulse Rate:  [63-128] 111 (06/30 0800) Resp:  [16-32] 19 (06/30 0800) BP: (77-106)/(45-69) 88/67 mmHg (06/30 0800) SpO2:  [97 %-100 %] 100 % (06/30 0800) Weight:  [67.5 kg (148 lb 13 oz)] 67.5 kg (148 lb 13 oz) (06/30 0500)  Weight change: -2.4 kg (-5 lb 4.7 oz) Filed Weights   12/10/14 0500 12/11/14 0700 12/12/14 0500  Weight: 65.8 kg (145 lb 1 oz) 69.9 kg (154 lb 1.6 oz) 67.5 kg (148 lb 13 oz)    Intake/Output: I/O last 3 completed shifts: In: 2635.1 [I.V.:1472.5; Blood:208; IV Piggyback:650] Out: 4097 [Urine:725; Emesis/NG output:1050]   Intake/Output this shift:     Physical Exam: General: lethargic, ill appearing  Head:  NG in place  Eyes: Anicteric  Neck: Supple, trachea midline  Lungs:  Clear to auscultation normal effort  Heart: S1S2 tachycardic  Abdomen:  Soft, significantly distended, mild diffuse tenderness, decreased bowel sounds  Extremities: no peripheral edema  Neurologic:  able to verbalize at times,   Skin: No acute rashes  Access:   Foley with clear urine  Basic Metabolic Panel:  Recent Labs Lab 12/07/14 0555 12/08/14 0530 12/09/14 0509  12/10/14 0500 12/10/14 2045 12/11/14 0537 12/11/14 1723 12/12/14 0535  NA 156* 155* 153*  --  149*  --  145  --  143  K 4.4 3.9 3.1*  < > 3.2* 3.2* 3.2* 3.5 3.2*  CL 125* 126* 125*  --  120*  --  115*  --  112*  CO2 21* 20* 22  --  24  --  24  --  24  GLUCOSE 253* 184* 131*  --  132*  --  161*  --  177*  BUN 46* 64* 53*  --  41*  --  49*  --  60*  CREATININE 2.57* 2.63* 2.25*  --  1.92*  --  2.85*  --  3.73*  CALCIUM 8.9 8.5* 8.5*  --  8.6*  --  7.8*  --  7.8*  MG 2.3 2.4 2.1  --  1.9  --  2.0  --  2.1  PHOS 4.0 6.0* 3.2  --  3.1  --   --   --  5.0*  < > = values in this  interval not displayed.  Liver Function Tests:  Recent Labs Lab 12/06/14 0437 12/12/14 0535  AST 28 13*  ALT 20 9*  ALKPHOS 141* 112  BILITOT 2.2* 0.7  PROT 5.9* 5.2*  ALBUMIN 2.2* 2.1*   No results for input(s): LIPASE, AMYLASE in the last 168 hours. No results for input(s): AMMONIA in the last 168 hours.  CBC:  Recent Labs Lab 12/08/14 1526 12/09/14 1242 12/10/14 0500 12/11/14 0537 12/12/14 0535  WBC 0.9* 1.3* 2.1* 3.6* 6.4  NEUTROABS  --   --  0.9* 2.8 5.0  HGB 7.9* 9.3* 8.9* 8.6* 7.9*  HCT 23.7* 27.8* 26.5* 25.8* 23.8*  MCV 84.2 85.9 86.2 87.4 87.7  PLT 16* 7* 13* 10* 16*    Cardiac Enzymes: No results for input(s): CKTOTAL, CKMB, CKMBINDEX, TROPONINI in the last 168 hours.  BNP: Invalid input(s): POCBNP  CBG:  Recent Labs Lab 12/11/14 0558 12/11/14 1145 12/11/14 1749 12/11/14 1816 12/12/14 0023  GLUCAP 142* 144* 74 92 150*  Microbiology: Results for orders placed or performed during the hospital encounter of 11/29/14  MRSA PCR Screening     Status: None   Collection Time: 11/29/14 12:13 PM  Result Value Ref Range Status   MRSA by PCR NEGATIVE NEGATIVE Final    Comment:        The GeneXpert MRSA Assay (FDA approved for NASAL specimens only), is one component of a comprehensive MRSA colonization surveillance program. It is not intended to diagnose MRSA infection nor to guide or monitor treatment for MRSA infections.   Culture, blood (routine x 2)     Status: None   Collection Time: 11/29/14  1:19 PM  Result Value Ref Range Status   Specimen Description BLOOD  Final   Special Requests Immunocompromised  Final   Culture NO GROWTH 5 DAYS  Final   Report Status 12/04/2014 FINAL  Final  Urine culture     Status: None   Collection Time: 11/30/14 10:12 AM  Result Value Ref Range Status   Specimen Description URINE, CATHETERIZED  Final   Special Requests Immunocompromised  Final   Culture NO GROWTH 2 DAYS  Final   Report Status  12/02/2014 FINAL  Final  Culture, blood (routine x 2)     Status: None   Collection Time: 12/06/14  8:08 PM  Result Value Ref Range Status   Specimen Description BLOOD  Final   Special Requests NONE  Final   Culture  Setup Time   Final    GRAM POSITIVE COCCI IN BOTH AEROBIC AND ANAEROBIC BOTTLES CRITICAL RESULT CALLED TO, READ BACK BY AND VERIFIED WITH: JENNIFER BEZARD AT 0947 12/08/14.PMH CONFIRMED BY [facility]    Culture   Final    STAPHYLOCOCCUS AURICULARIS IN BOTH AEROBIC AND ANAEROBIC BOTTLES    Report Status 12/11/2014 FINAL  Final   Organism ID, Bacteria STAPHYLOCOCCUS AURICULARIS  Final      Susceptibility   Staphylococcus auricularis - MIC*    CIPROFLOXACIN >=8 RESISTANT Resistant     ERYTHROMYCIN >=8 RESISTANT Resistant     GENTAMICIN <=0.5 SENSITIVE Sensitive     OXACILLIN >=4 RESISTANT Resistant     TETRACYCLINE <=1 SENSITIVE Sensitive     VANCOMYCIN 1 SENSITIVE Sensitive     CLINDAMYCIN <=0.25 SENSITIVE Sensitive     TRIMETH/SULFA Value in next row Sensitive      SENSITIVE<=20    LEVOFLOXACIN Value in next row Intermediate      INTERMEDIATE4    * STAPHYLOCOCCUS AURICULARIS  Culture, blood (routine x 2)     Status: None   Collection Time: 12/06/14  8:40 PM  Result Value Ref Range Status   Specimen Description BLOOD  Final   Special Requests NONE  Final   Culture NO GROWTH 5 DAYS  Final   Report Status 12/11/2014 FINAL  Final  Culture, blood (routine x 2)     Status: None (Preliminary result)   Collection Time: 12/08/14 11:40 AM  Result Value Ref Range Status   Specimen Description BLOOD  Final   Special Requests Normal  Final   Culture NO GROWTH 3 DAYS  Final   Report Status PENDING  Incomplete  Culture, blood (routine x 2)     Status: None (Preliminary result)   Collection Time: 12/08/14 11:49 AM  Result Value Ref Range Status   Specimen Description BLOOD  Final   Special Requests Normal  Final   Culture NO GROWTH 3 DAYS  Final   Report Status PENDING   Incomplete    Coagulation  Studies: No results for input(s): LABPROT, INR in the last 72 hours.  Urinalysis: No results for input(s): COLORURINE, LABSPEC, PHURINE, GLUCOSEU, HGBUR, BILIRUBINUR, KETONESUR, PROTEINUR, UROBILINOGEN, NITRITE, LEUKOCYTESUR in the last 72 hours.  Invalid input(s): APPERANCEUR    Imaging: Ct Abdomen Pelvis Wo Contrast  12/10/2014   CLINICAL DATA:  Constipation and abdominal distension. History of lymphoma.  EXAM: CT ABDOMEN AND PELVIS WITHOUT CONTRAST  TECHNIQUE: Multidetector CT imaging of the abdomen and pelvis was performed following the standard protocol without IV contrast.  COMPARISON:  CT scan 11/25/2014  FINDINGS: Lower chest: The heart is enlarged but stable. Small amount pericardial fluid is stable. There is an NG tube coursing down the esophagus and into the stomach. Mild diffuse esophageal wall thickening. There is a right pleural effusion and right lower lobe infiltrate. Aspiration would be a consideration. The left lung base is clear.  Hepatobiliary: No focal hepatic lesions or intrahepatic biliary dilatation numerous gallstones are noted in the gallbladder. No common bile duct dilatation.  Pancreas: Moderate pancreatic atrophy but no mass, inflammation or duct dilatation.  Spleen: Normal size.  No focal lesions.  Adrenals/Urinary Tract: The adrenal glands and kidneys are stable. There is a double-J ureteral stent noted on the left side. No hydronephrosis.  Stomach/Bowel: The stomach, duodenum and small bowel are grossly normal without oral contrast no inflammatory changes, mass lesions or obstructive findings. The colon is distended with air and stool all the way down to the rectum which contains contrast material likely from the prior CT scan. Findings suggest constipation and fecal impaction in the rectum. Suspect benign pneumatosis coli involving the right colon.  Vascular/Lymphatic: Advanced atherosclerotic calcifications involving the aorta and branch  vessels. No aneurysm. Bulky retroperitoneal and pelvic lymphadenopathy not significantly change when compartment to prior recent study.  Other: Foley catheter noted in the bladder. Bulky pelvic sidewall adenopathy. Bilateral inguinal adenopathy.  Musculoskeletal: No significant bony findings. Moderate osteoporosis.  IMPRESSION: 1. Large amount of air in stool throughout the colon and down into the rectum suggesting constipation and fecal impaction. 2. Stable bulky retrocrural, retroperitoneal, pelvic and inguinal adenopathy consistent with known lymphoma. No significant interval change since the prior study from 11/25/2014. 3. Cholelithiasis. 4. Right pleural effusion and right lower lobe infiltrate.   Electronically Signed   By: Marijo Sanes M.D.   On: 12/10/2014 18:24   Dg Abd 1 View  12/12/2014   CLINICAL DATA:  Ileus, followup  EXAM: ABDOMEN - 1 VIEW  COMPARISON:  Abdomen film of 12/11/2014 and CT abdomen pelvis of 12/10/2014  FINDINGS: There is very little interval change in the considerable gaseous distention of the entire colon. In review of the CT recently, there is no evidence of distal distal colonic obstruction but a moderate amount of feces was present at that time within the colon. A left double-J ureteral stent is present and an NG tube extends below the hemidiaphragm.  IMPRESSION: No change in diffuse gaseous distention of the colon.   Electronically Signed   By: Ivar Drape M.D.   On: 12/12/2014 07:53   Dg Abd 1 View  12/11/2014   CLINICAL DATA:  Ileus follow-up  EXAM: ABDOMEN - 1 VIEW  COMPARISON:  CT scan of the chest as well as KUB of December 10, 2014  FINDINGS: There remains significant gaseous distention of much of the colon. There is contrast within the distal sigmoid and in the rectum. No free extraluminal gas collections are observed. The nasogastric tube has been advanced. The left ureteral  stent is unchanged in position.  IMPRESSION: Stable appearance of the colon with gaseous  distention compatible with diffuse colonic ileus. Contrast remains in the rectosigmoid.   Electronically Signed   By: David  Martinique M.D.   On: 12/11/2014 07:43     Medications:   . Marland KitchenTPN (CLINIMIX-E) Adult 25 mL/hr at 12/11/14 1749  . dextrose 5 % with KCl 20 mEq / L 20 mEq (12/12/14 0345)   . antiseptic oral rinse  7 mL Mouth Rinse BID  . aspirin  324 mg Oral Once  . bisacodyl  10 mg Rectal Daily  . erythromycin  250 mg Intravenous 3 times per day  . filgrastim  480 mcg Subcutaneous Once  . filgrastim  480 mcg Subcutaneous Daily  . insulin aspart  1-3 Units Subcutaneous Q6H  . lactulose  30 g Oral BID  . methylPREDNISolone (SOLU-MEDROL) injection  60 mg Intravenous Once  . mometasone-formoterol  2 puff Inhalation BID  . potassium chloride  20 mEq Oral BID  . small volume/piggyback builder   Intravenous Once  . scopolamine  1 patch Transdermal Q72H  . sennosides  10 mL Oral BID  . thiamine IV  100 mg Intravenous Daily  . Vancomycin  750 mg Intravenous Q24H  . ziprasidone  10 mg Intramuscular QHS   acetaminophen **OR** acetaminophen, bethanechol, haloperidol lactate, heparin lock flush, magnesium hydroxide, metoprolol, morphine injection, ondansetron (ZOFRAN) IV, sodium chloride, ziprasidone  Assessment/ Plan:  64 AAM with progressive mantle cell lymphoma, RICE chemo in 11/2014, hx left sided hydronephrosis and hydroureter. S/p ureteral stent placement    1.  Acute renal failure due to ATN. 2.  Sepsis with klebsiella oxytoca. 3.  Hypernatremia.  4.  Hypokalemia.  Plan:  S Cr has worsened again with dropping UOP . Levels are getting critically high  Na in normal range now,   Replace Kcl prn Leukopenia and thrombocytopenia noted, mgmt per IM/oncology team Discussed possibility of needing dialysis with his son although that would be significant step in escalation of care with major risks such as bleeding from cathter site (low platelets) and hemodynamic instability due to critical  illness    LOS: 13 Samirah Scarpati 6/30/20169:04 AM

## 2014-12-12 NOTE — Progress Notes (Signed)
Subjective: 1 large soft nonbloody BM today.  Pt nonverbal.  No complaints of pain per family or RN.   Objective: Vital signs in last 24 hours: Temp:  [97.6 F (36.4 C)-98.9 F (37.2 C)] 98.1 F (36.7 C) (06/30 0800) Pulse Rate:  [63-128] 111 (06/30 0800) Resp:  [16-32] 19 (06/30 0800) BP: (77-106)/(45-69) 88/67 mmHg (06/30 0800) SpO2:  [97 %-100 %] 100 % (06/30 0800) Weight:  [148 lb 13 oz (67.5 kg)] 148 lb 13 oz (67.5 kg) (06/30 0500) Last BM Date: 12/12/14 No LMP for male patient. Body mass index is 20.18 kg/(m^2). General:  Lethargic, arouses to tactile stimulation, but does not converse.  Son at bedside. Head:  Normocephalic and atraumatic. Eyes:  +exopthalmos.  Sclera clear, no icterus.   Conjunctiva pink. Mouth:  No deformity or lesions, oropharynx pink & moist. Neck:  Supple; no masses or thyromegaly. Heart:  Regular rate and rhythm; no murmurs, clicks, rubs, or gallops. Abdomen:   +Very faint bowel sounds.  +massive distention.  No masses, hepatosplenomegaly or hernias noted.  No guarding or rebound tenderness.   Msk:  Symmetrical Pulses:  Normal pulses noted. Extremities:  1+ pedal edema bilat.  No cyanosis Neurologic:  Nonverbal, resting quietly Skin:  Intact without significant lesions or rashes.  Intake/Output from previous day: 06/29 0701 - 06/30 0700 In: 2635.1 [I.V.:1472.5; Blood:208; IV Piggyback:650; TPN:304.6] Out: 0630 [Urine:725; Emesis/NG output:1050]  Lab Results:  Recent Labs  12/10/14 0500 12/11/14 0537 12/12/14 0535  WBC 2.1* 3.6* 6.4  HGB 8.9* 8.6* 7.9*  HCT 26.5* 25.8* 23.8*  PLT 13* 10* 16*   BMET  Recent Labs  12/10/14 0500  12/11/14 0537 12/11/14 1723 12/12/14 0535  NA 149*  --  145  --  143  K 3.2*  < > 3.2* 3.5 3.2*  CL 120*  --  115*  --  112*  CO2 24  --  24  --  24  GLUCOSE 132*  --  161*  --  177*  BUN 41*  --  49*  --  60*  CREATININE 1.92*  --  2.85*  --  3.73*  CALCIUM 8.6*  --  7.8*  --  7.8*  < > = values in  this interval not displayed. LFT  Recent Labs  12/12/14 0535  PROT 5.2*  ALBUMIN 2.1*  AST 13*  ALT 9*  ALKPHOS 112  BILITOT 0.7   Studies/Results: Ct Abdomen Pelvis Wo Contrast  12/10/2014   CLINICAL DATA:  Constipation and abdominal distension. History of lymphoma.  EXAM: CT ABDOMEN AND PELVIS WITHOUT CONTRAST  TECHNIQUE: Multidetector CT imaging of the abdomen and pelvis was performed following the standard protocol without IV contrast.  COMPARISON:  CT scan 11/25/2014  FINDINGS: Lower chest: The heart is enlarged but stable. Small amount pericardial fluid is stable. There is an NG tube coursing down the esophagus and into the stomach. Mild diffuse esophageal wall thickening. There is a right pleural effusion and right lower lobe infiltrate. Aspiration would be a consideration. The left lung base is clear.  Hepatobiliary: No focal hepatic lesions or intrahepatic biliary dilatation numerous gallstones are noted in the gallbladder. No common bile duct dilatation.  Pancreas: Moderate pancreatic atrophy but no mass, inflammation or duct dilatation.  Spleen: Normal size.  No focal lesions.  Adrenals/Urinary Tract: The adrenal glands and kidneys are stable. There is a double-J ureteral stent noted on the left side. No hydronephrosis.  Stomach/Bowel: The stomach, duodenum and small bowel are grossly normal without oral  contrast no inflammatory changes, mass lesions or obstructive findings. The colon is distended with air and stool all the way down to the rectum which contains contrast material likely from the prior CT scan. Findings suggest constipation and fecal impaction in the rectum. Suspect benign pneumatosis coli involving the right colon.  Vascular/Lymphatic: Advanced atherosclerotic calcifications involving the aorta and branch vessels. No aneurysm. Bulky retroperitoneal and pelvic lymphadenopathy not significantly change when compartment to prior recent study.  Other: Foley catheter noted in the  bladder. Bulky pelvic sidewall adenopathy. Bilateral inguinal adenopathy.  Musculoskeletal: No significant bony findings. Moderate osteoporosis.  IMPRESSION: 1. Large amount of air in stool throughout the colon and down into the rectum suggesting constipation and fecal impaction. 2. Stable bulky retrocrural, retroperitoneal, pelvic and inguinal adenopathy consistent with known lymphoma. No significant interval change since the prior study from 11/25/2014. 3. Cholelithiasis. 4. Right pleural effusion and right lower lobe infiltrate.   Electronically Signed   By: Marijo Sanes M.D.   On: 12/10/2014 18:24   Dg Abd 1 View  12/12/2014   CLINICAL DATA:  Ileus, followup  EXAM: ABDOMEN - 1 VIEW  COMPARISON:  Abdomen film of 12/11/2014 and CT abdomen pelvis of 12/10/2014  FINDINGS: There is very little interval change in the considerable gaseous distention of the entire colon. In review of the CT recently, there is no evidence of distal distal colonic obstruction but a moderate amount of feces was present at that time within the colon. A left double-J ureteral stent is present and an NG tube extends below the hemidiaphragm.  IMPRESSION: No change in diffuse gaseous distention of the colon.   Electronically Signed   By: Ivar Drape M.D.   On: 12/12/2014 07:53   Dg Abd 1 View  12/11/2014   CLINICAL DATA:  Ileus follow-up  EXAM: ABDOMEN - 1 VIEW  COMPARISON:  CT scan of the chest as well as KUB of December 10, 2014  FINDINGS: There remains significant gaseous distention of much of the colon. There is contrast within the distal sigmoid and in the rectum. No free extraluminal gas collections are observed. The nasogastric tube has been advanced. The left ureteral stent is unchanged in position.  IMPRESSION: Stable appearance of the colon with gaseous distention compatible with diffuse colonic ileus. Contrast remains in the rectosigmoid.   Electronically Signed   By: David  Martinique M.D.   On: 12/11/2014 07:43    Assessment: #1   Colonic ileus:  No improvement despite some results with enema.  Will add rectal tube for decompression now that neutropenia precautions have been lifted.  Plan: #1  Rectal tube with position changes q2 hrs #2  Continue lactulose  The care of Caleb Francis will be discussed in direct collaboration with Dr Lucilla Lame, Attending Gastroenterologist.  LOS: 13 days  Vickey Huger  12/12/2014, 9:17 AM [facility]  [facility] [place], [place] [address] Phone: [phone] Fax : [phone]

## 2014-12-12 NOTE — Progress Notes (Signed)
[place] at [facility]                                                                                                                                                                                            Patient Demographics   Caleb Francis, is a 69 y.o. male, DOB - Jan 05, 1946, QAS:[id number]  Admit date - 11/29/2014   Admitting Physician Aldean Jewett, MD  Outpatient Primary MD for the patient is No PCP Per Patient   Subjective:  Patient's mental status continues to wax and wane. His creatinine has increased. Patient abdomen is more distended today.  Review of Systems:   Review of Systems  Unable to perform ROS: critical illness      Vitals:   Filed Vitals:   12/12/14 0800 12/12/14 0900 12/12/14 1000 12/12/14 1100  BP: 88/67 108/62 122/75 90/65  Pulse: 111 113  113  Temp: 98.1 F (36.7 C)     TempSrc:      Resp: 19 21 16 22   Height:      Weight:      SpO2: 100% 100%  100%    Wt Readings from Last 3 Encounters:  12/12/14 67.5 kg (148 lb 13 oz)  11/27/14 68.2 kg (150 lb 5.7 oz)  11/25/14 70.761 kg (156 lb)     Intake/Output Summary (Last 24 hours) at 12/12/14 1146 Last data filed at 12/12/14 0600  Gross per 24 hour  Intake 2635.08 ml  Output   1775 ml  Net 860.08 ml    Physical Exam:   GENERAL:  Has NGT placed critically ill-appearing HEAD, EYES, EARS, NOSE AND THROAT NGT placed PERRLA Atraumatic  NECK: Supple. There is no jugular venous distention. No bruits, no lymphadenopathy, no thyromegaly.  HEART:  Tachycardic 2/6 systolic ejection murmur no rubs LUNGS: Bilateral rhonchi and wheezing.  ABDOMEN: Distended bowel hypoactive without rebound or guarding  EXTREMITIES: No evidence of any cyanosis, clubbing,   NEUROLOGIC: Lethargic  SKIN: Moist and warm with no rashes appreciated.  Psych:    no agitation reported overnight.  Radiology Reports 12/10/2014: CT abdomen: Large distended colonic ileus. No  obstruction  12/12/2014: KUB   CBC  Recent Labs Lab 12/08/14 1526 12/09/14 1242 12/10/14 0500 12/11/14 0537 12/12/14 0535  WBC 0.9* 1.3* 2.1* 3.6* 6.4  HGB 7.9* 9.3* 8.9* 8.6* 7.9*  HCT 23.7* 27.8* 26.5* 25.8* 23.8*  PLT 16* 7* 13* 10* 16*  MCV 84.2 85.9 86.2 87.4 87.7  MCH 28.2 28.7 28.9 29.1 29.0  MCHC 33.4 33.4 33.6 33.3 33.1  RDW 18.1* 17.9* 17.7* 18.0* 18.2*  LYMPHSABS  --   --  0.9 0.4* 1.2  MONOABS  --   --  0.1 0.4 0.1  EOSABS  --   --  0.0 0.0 0.1  BASOSABS  --   --  0.0 0.0 0.0    Chemistries   Recent Labs Lab 12/06/14 0437  12/08/14 0530 12/09/14 0509  12/10/14 0500 12/10/14 2045 12/11/14 0537 12/11/14 1723 12/12/14 0535  NA 154*  < > 155* 153*  --  149*  --  145  --  143  K 4.2  < > 3.9 3.1*  < > 3.2* 3.2* 3.2* 3.5 3.2*  CL 130*  < > 126* 125*  --  120*  --  115*  --  112*  CO2 18*  < > 20* 22  --  24  --  24  --  24  GLUCOSE 208*  < > 184* 131*  --  132*  --  161*  --  177*  BUN 42*  < > 64* 53*  --  41*  --  49*  --  60*  CREATININE 2.04*  < > 2.63* 2.25*  --  1.92*  --  2.85*  --  3.73*  CALCIUM 8.6*  < > 8.5* 8.5*  --  8.6*  --  7.8*  --  7.8*  MG 1.9  < > 2.4 2.1  --  1.9  --  2.0  --  2.1  AST 28  --   --   --   --   --   --   --   --  13*  ALT 20  --   --   --   --   --   --   --   --  9*  ALKPHOS 141*  --   --   --   --   --   --   --   --  112  BILITOT 2.2*  --   --   --   --   --   --   --   --  0.7  < > = values in this interval not displayed. -------------------------------------------------------------------------------------------------------------------  Recent Labs  12/10/14 1247  TSH 0.556   ------------------------------------------------------------------------------------------------------------------  Coagulation profile  Recent Labs Lab 12/07/14 2129  INR 1.28     Assessment & Plan  This is a 69 year old male with mantle cell lymphoma undergoing chemotherapy who was admitted June 15 from oncology clinic with  altered mental status and chest pain.    #1 Sepsis: Patient's urine culture and blood cultures were positive for Klebsiella on admission. He  was initially placed on Rocephin.  Due to his low blood pressure and elevated heart rates on 12/06/2014 he was started on Zosyn and vancomycin.  Dr. Ola Spurr recommends changing Rocephin to ceftaz  to cover for KLEBSIELLA as well as neutropenia.  Repeat blood cx on 12/06/2014 are growing  STAPHYLOCOCCUS AURICULARIS . Patient has been adequately treated for Klebsiella and therefore I will discontinue ceftaz. Patient will  need be on vancomycin. At this time patient does not need his port removed. Repeat blood cultures thus far are negative to date. There is a suspicion that the platelets may have been the culprit for patient's hypotension and sepsis. He was temporarily placed on pressors on 6/24 with the transfusion reaction  but has now been off of this and BP stable. He remins CRITICALLY ILL   #2 altered mental status - This is due to metabolic encephalopathy due to sepsis and urinary tract infection/ARF/Hypernatremia along with ICU  delirium. - CT head/MRI brain showing no evidence of acute pathology.  -Patient remains to have some lethargic and mental status changes however he has some improvement.  #3 chest pain on admission: No EKG changes to suggest ischemia.  Continue to monitor for any further cardiac symptoms  #4 Klebsiella urinary tract infection with bacteremia: Patient has being treated for 2 weeks on appropriate antibiotics.  5. Mantle cell lymphoma: Patient is on salvage chemotherapy. Patient follows with Dr. Mike Gip.   He is s/p chemo He is on GCSF and no longer neutropenic.  he may no longer require G-CSF, however I will have oncology address this  medication.   #6. Panctytopenia: Due to chemotherapy and Mantle cell lymphoma, patient is currently on Neupogen. Patient is s/p 2 units PRBC on 6/25 and has received PLT transfusion almost  daily. Patient needs premedication prior to any transfusions including Benadryl and Tylenol.    #7. Acute renal failure due to ATN : Appreciate nephrology input.Patient's creatinine continues to worsen. As per nephrology if creatinine does not improve may have to consider possible dialysis.  8 Hypokalemia - continue supplementation when necessary #9 Thrombocytopenia This is due to sepsis.  #10. Sinus tachycardia: Continue telemetry monitor. 12 colonic Ileus with GI bleed: CT scan shows colonic ileus that has worsened. Patient is no longer neutropenic.  I have spoken with gastroenterology. Patient will have rectal tube placed today. KUB in the a.m. 12. Hypernatremia unclear etiology: Resolved with appropriate fluids.[name] Appreciate nephrology consultation. 13. GI bleed: Patient currently has an NG tube placed. We will continue PPI and CBC monitoring. At this time we will continue supportive therapy due to low platelets GI will be unable to do any invasive procedure. 14. Nutritiodate to have TPN. 18. Constipation: Patient not had a bowel movement in several days. Patient currently is NG tube plplan for rectal tube today. Overall Prognosis is very poor  Appreciate palliative care consultation    DNR  DVT Prophylaxis   SCD's   Lab Results  Component Value Date   PLT 16* 12/12/2014     Total Time Spent in minutes - 30 min     Greater than 50% of time spent in care and coordination  Caleb Francis M.D on 12/12/2014 at 11:46 AM  Between 7am to 6pm - Pager - [phone]  After 6pm go to [email or web address] - password [email or web address] [facility]   Office  [phone]

## 2014-12-12 NOTE — Consult Note (Signed)
Palliative Medicine Inpatient Consult Follow Up Note   Name: Caleb Francis Date: 12/12/2014 MRN: [id number]  DOB: [birth year]  Referring Physician: Bettey Costa, MD  Palliative Care consult requested for this 69 y.o. male for goals of medical therapy in patient with mantle cell lymphoma, getting chemotherapy, admitted with altered mental status  [name] is lying in bed in CCU, sleeping. Awakens to voice, tries to answer questions. Son at bedside.   REVIEW OF SYSTEMS:  Pain: None  Unable to complete ROS  CODE STATUS: DNR   PAST MEDICAL HISTORY: Past Medical History  Diagnosis Date  . Arthritis   . Hypertension   . RA (rheumatoid arthritis)   . Anemia   . Mantle cell lymphoma     Dr Caleb Francis  . History of nuclear stress test     a. 12/2013: low risk, no sig ischemia, no EKG changes, no artifact, EF 63%  . Chronic kidney disease   . Sepsis     Dr Caleb Francis    PAST SURGICAL HISTORY:  Past Surgical History  Procedure Laterality Date  . Back surgery    . Fracture surgery      ankle  . Portacath placement    . Cystoscopy w/ ureteral stent placement Left 10/24/2014    Procedure: CYSTOSCOPY WITH RETROGRADE PYELOGRAM/URETERAL STENT PLACEMENT;  Surgeon: Irine Seal, MD;  Location: [facility] ORS;  Service: Urology;  Laterality: Left;    Vital Signs: BP 88/67 mmHg  Pulse 111  Temp(Src) 98.1 F (36.7 C) (Axillary)  Resp 19  Ht 6' (1.829 m)  Wt 67.5 kg (148 lb 13 oz)  BMI 20.18 kg/m2  SpO2 100% Filed Weights   12/10/14 0500 12/11/14 0700 12/12/14 0500  Weight: 65.8 kg (145 lb 1 oz) 69.9 kg (154 lb 1.6 oz) 67.5 kg (148 lb 13 oz)    Estimated body mass index is 20.18 kg/(m^2) as calculated from the following:   Height as of this encounter: 6' (1.829 m).   Weight as of this encounter: 67.5 kg (148 lb 13 oz).  PHYSICAL EXAM: General: Critically ill appearing HEENT: OP clear, poor dentition, NG in place Neck: Trachea midline  Cardiovascular: regular rhythm,  tachycardic Pulmonary: fair air movemnt ant fields, no audible wheeze Abdominal: firm, distended, hypoactive BS's  GU: no suprapubic tenderness Extremities: no edema BLE's Neurological: opens eyes to voice, attempts to answer questions Skin: no rashes Psychiatric: unable to assess  LABS: CBC:    Component Value Date/Time   WBC 6.4 12/12/2014 0535   WBC 6.4 07/17/2014 1121   HGB 7.9* 12/12/2014 0535   HGB 11.8* 07/17/2014 1121   HCT 23.8* 12/12/2014 0535   HCT 35.0* 07/17/2014 1121   PLT 16* 12/12/2014 0535   PLT 263 07/17/2014 1121   MCV 87.7 12/12/2014 0535   MCV 83 07/17/2014 1121   NEUTROABS 5.0 12/12/2014 0535   NEUTROABS 3.4 07/17/2014 1121   LYMPHSABS 1.2 12/12/2014 0535   LYMPHSABS 1.9 07/17/2014 1121   MONOABS 0.1 12/12/2014 0535   MONOABS 0.8 07/17/2014 1121   EOSABS 0.1 12/12/2014 0535   EOSABS 0.2 07/17/2014 1121   BASOSABS 0.0 12/12/2014 0535   BASOSABS 0.0 07/17/2014 1121   Comprehensive Metabolic Panel:    Component Value Date/Time   NA 143 12/12/2014 0535   NA 141 07/17/2014 1121   K 3.2* 12/12/2014 0535   K 2.8* 07/17/2014 1121   CL 112* 12/12/2014 0535   CL 100 07/17/2014 1121   CO2 24 12/12/2014 0535   CO2 32 07/17/2014  1121   BUN 60* 12/12/2014 0535   BUN 9 07/17/2014 1121   CREATININE 3.73* 12/12/2014 0535   CREATININE 1.09 07/17/2014 1121   GLUCOSE 177* 12/12/2014 0535   GLUCOSE 105* 07/17/2014 1121   CALCIUM 7.8* 12/12/2014 0535   CALCIUM 8.9 07/17/2014 1121   AST 13* 12/12/2014 0535   AST 17 07/17/2014 1121   ALT 9* 12/12/2014 0535   ALT 12* 07/17/2014 1121   ALKPHOS 112 12/12/2014 0535   ALKPHOS 79 07/17/2014 1121   BILITOT 0.7 12/12/2014 0535   PROT 5.2* 12/12/2014 0535   PROT 7.3 07/17/2014 1121   ALBUMIN 2.1* 12/12/2014 0535   ALBUMIN 3.1* 07/17/2014 1121    IMPRESSION: [name] is a 69 yo man with PMH of mantle cell lymphoma dx 02/2013 on salvage chemo, HTN, RA, 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 He was  hospitalzed 5/27-11/19/14 with abd pain, sepsis, A/CKD. He was discharged to SNF and continued with chemotherapy. He was readmitted 11/29/14 with altered mental status. Urine and blood cx's + for Klebsiella. Mental status remains poor. MRI without contrast shows no acute change. EEG c/w metabolic encephalopathy. Developed hypernatremia which has corrected. Mental status waxes and wanes.  [name] mental status continues to wax and wane. Renal function worsening. Prognosis poor. Discussed with son.   PLAN: As above   More than 50% of the visit was spent in counseling/coordination of care: YES  Time spent: 25 minutes

## 2014-12-12 NOTE — Progress Notes (Signed)
[facility] INFECTIOUS DISE[facility] PROGRESS NOTE Date of Admission:  11/29/2014     ID: Caleb Francis Caleb Francis is a 69 y.o. male with sepsis and mantel cell lymphoma   Principal Problem:   Sepsis Active Problems:   Mantle cell lymphoma   Altered mental status   Chest pain   Metabolic encephalopathy   Ileus   Fecal impaction   Protein-calorie malnutrition, severe   Subjective: No fevers, neutropenia resolved. Worsening renal function  ROS  Eleven systems are reviewed and negative except per hpi  Medications:  Antibiotics Given (last 72 hours)    Date/Time Action Medication Dose Rate   12/09/14 1357 Given   erythromycin 250 mg in sodium chloride 0.9 % 100 mL IVPB 250 mg 100 mL/hr   12/09/14 1815 Given   metroNIDAZOLE (FLAGYL) IVPB 500 mg 500 mg 100 mL/hr   12/09/14 2209 Given   erythromycin 250 mg in sodium chloride 0.9 % 100 mL IVPB 250 mg 100 mL/hr   12/10/14 0300 Given   metroNIDAZOLE (FLAGYL) IVPB 500 mg 500 mg 100 mL/hr   12/10/14 0555 Given   erythromycin 250 mg in sodium chloride 0.9 % 100 mL IVPB 250 mg 100 mL/hr   12/10/14 0911 Given   vancomycin (VANCOCIN) IVPB 750 mg/150 ml premix 750 mg 150 mL/hr   12/10/14 1059 Given   cefTAZidime (FORTAZ) 2 g in dextrose 5 % 50 mL IVPB 2 g 100 mL/hr   12/10/14 1248 Given   metroNIDAZOLE (FLAGYL) IVPB 500 mg 500 mg 100 mL/hr   12/10/14 1629 Given   erythromycin 250 mg in sodium chloride 0.9 % 100 mL IVPB 250 mg 100 mL/hr   12/10/14 1812 Given   metroNIDAZOLE (FLAGYL) IVPB 500 mg 500 mg 100 mL/hr   12/10/14 2317 Given  [received med from [facility]. at 2315]   erythromycin 250 mg in sodium chloride 0.9 % 100 mL IVPB 250 mg 100 mL/hr   12/11/14 0300 Given   metroNIDAZOLE (FLAGYL) IVPB 500 mg 500 mg 100 mL/hr   12/11/14 0650 Given   erythromycin 250 mg in sodium chloride 0.9 % 100 mL IVPB 250 mg 100 mL/hr   12/11/14 1023 Given   vancomycin (VANCOCIN) IVPB 750 mg/150 ml premix 750 mg 150 mL/hr   12/11/14 1412 Given   erythromycin  250 mg in sodium chloride 0.9 % 100 mL IVPB 250 mg 100 mL/hr   12/11/14 2253 Given   erythromycin 250 mg in sodium chloride 0.9 % 100 mL IVPB 250 mg 100 mL/hr   12/12/14 0550 Given   erythromycin 250 mg in sodium chloride 0.9 % 100 mL IVPB 250 mg 100 mL/hr   12/12/14 0808 Given   vancomycin (VANCOCIN) IVPB 750 mg/150 ml premix 750 mg 150 mL/hr     . antiseptic oral rinse  7 mL Mouth Rinse BID  . aspirin  324 mg Oral Once  . bisacodyl  10 mg Rectal Daily  . erythromycin  250 mg Intravenous 3 times per day  . filgrastim  480 mcg Subcutaneous Once  . filgrastim  480 mcg Subcutaneous Daily  . insulin aspart  1-3 Units Subcutaneous Q6H  . lactulose  30 g Oral BID  . methylPREDNISolone (SOLU-MEDROL) injection  60 mg Intravenous Once  . mometasone-formoterol  2 puff Inhalation BID  . potassium chloride  20 mEq Oral BID  . small volume/piggyback builder   Intravenous Once  . scopolamine  1 patch Transdermal Q72H  . sennosides  10 mL Oral BID  . thiamine IV  100 mg Intravenous Daily  . ziprasidone  10 mg Intramuscular QHS    Objective: Vital signs in last 24 hours: Temp:  [97.6 F (36.4 C)-98.9 F (37.2 C)] 98.1 F (36.7 C) (06/30 0800) Pulse Rate:  [63-128] 108 (06/30 1200) Resp:  [16-32] 25 (06/30 1200) BP: (77-122)/(45-75) 104/70 mmHg (06/30 1200) SpO2:  [97 %-100 %] 100 % (06/30 1200) Weight:  [67.5 kg (148 lb 13 oz)] 67.5 kg (148 lb 13 oz) (06/30 0500) GEN - cachectic, able to answer questions Muddy sclera, ngt in place with dark drainage Reg Coarse bs bil Abd is distented tympanic- rectal tube in place No edema Neuro lethargic Access - portacath in place. No tenderness Lab Results  Recent Labs  12/11/14 0537 12/11/14 1723 12/12/14 0535  WBC 3.6*  --  6.4  HGB 8.6*  --  7.9*  HCT 25.8*  --  23.8*  NA 145  --  143  K 3.2* 3.5 3.2*  CL 115*  --  112*  CO2 24  --  24  BUN 49*  --  60*  CREATININE 2.85*  --  3.73*   CBC Latest Ref Rng 12/12/2014 12/11/2014  12/10/2014  WBC 3.8 - 10.6 K/uL 6.4 3.6(L) 2.1(L)  Hemoglobin 13.0 - 18.0 g/dL 7.9(L) 8.6(L) 8.9(L)  Hematocrit 40.0 - 52.0 % 23.8(L) 25.8(L) 26.5(L)  Platelets 150 - 440 K/uL 16(LL) 10(LL) 13(LL)     Microbiology: Results for orders placed or performed during the hospital encounter of 11/29/14  MRSA PCR Screening     Status: None   Collection Time: 11/29/14 12:13 PM  Result Value Ref Range Status   MRSA by PCR NEGATIVE NEGATIVE Final    Comment:        The GeneXpert MRSA Assay (FDA approved for NASAL specimens only), is one component of a comprehensive MRSA colonization surveillance program. It is not intended to diagnose MRSA infection nor to guide or monitor treatment for MRSA infections.   Culture, blood (routine x 2)     Status: None   Collection Time: 11/29/14  1:19 PM  Result Value Ref Range Status   Specimen Description BLOOD  Final   Special Requests Immunocompromised  Final   Culture NO GROWTH 5 DAYS  Final   Report Status 12/04/2014 FINAL  Final  Urine culture     Status: None   Collection Time: 11/30/14 10:12 AM  Result Value Ref Range Status   Specimen Description URINE, CATHETERIZED  Final   Special Requests Immunocompromised  Final   Culture NO GROWTH 2 DAYS  Final   Report Status 12/02/2014 FINAL  Final  Culture, blood (routine x 2)     Status: None   Collection Time: 12/06/14  8:08 PM  Result Value Ref Range Status   Specimen Description BLOOD  Final   Special Requests NONE  Final   Culture  Setup Time   Final    GRAM POSITIVE COCCI IN BOTH AEROBIC AND ANAEROBIC BOTTLES CRITICAL RESULT CALLED TO, READ BACK BY AND VERIFIED WITH: JENNIFER BEZARD AT 4709 12/08/14.PMH CONFIRMED BY RWW    Culture   Final    STAPHYLOCOCCUS AURICULARIS IN BOTH AEROBIC AND ANAEROBIC BOTTLES    Report Status 12/11/2014 FINAL  Final   Organism ID, Bacteria STAPHYLOCOCCUS AURICULARIS  Final      Susceptibility   Staphylococcus auricularis - MIC*    CIPROFLOXACIN >=8  RESISTANT Resistant     ERYTHROMYCIN >=8 RESISTANT Resistant     GENTAMICIN <=0.5 SENSITIVE Sensitive  OXACILLIN >=4 RESISTANT Resistant     TETRACYCLINE <=1 SENSITIVE Sensitive     VANCOMYCIN 1 SENSITIVE Sensitive     CLINDAMYCIN <=0.25 SENSITIVE Sensitive     TRIMETH/SULFA Value in next row Sensitive      SENSITIVE<=20    LEVOFLOXACIN Value in next row Intermediate      INTERMEDIATE4    * STAPHYLOCOCCUS AURICULARIS  Culture, blood (routine x 2)     Status: None   Collection Time: 12/06/14  8:40 PM  Result Value Ref Range Status   Specimen Description BLOOD  Final   Special Requests NONE  Final   Culture NO GROWTH 5 DAYS  Final   Report Status 12/11/2014 FINAL  Final  Culture, blood (routine x 2)     Status: None (Preliminary result)   Collection Time: 12/08/14 11:40 AM  Result Value Ref Range Status   Specimen Description BLOOD  Final   Special Requests Normal  Final   Culture NO GROWTH 3 DAYS  Final   Report Status PENDING  Incomplete  Culture, blood (routine x 2)     Status: None (Preliminary result)   Collection Time: 12/08/14 11:49 AM  Result Value Ref Range Status   Specimen Description BLOOD  Final   Special Requests Normal  Final   Culture NO GROWTH 3 DAYS  Final   Report Status PENDING  Incomplete    Studies/Results: Ct Abdomen Pelvis Wo Contrast  12/10/2014   CLINICAL DATA:  Constipation and abdominal distension. History of lymphoma.  EXAM: CT ABDOMEN AND PELVIS WITHOUT CONTRAST  TECHNIQUE: Multidetector CT imaging of the abdomen and pelvis was performed following the standard protocol without IV contrast.  COMPARISON:  CT scan 11/25/2014  FINDINGS: Lower chest: The heart is enlarged but stable. Small amount pericardial fluid is stable. There is an NG tube coursing down the esophagus and into the stomach. Mild diffuse esophageal wall thickening. There is a right pleural effusion and right lower lobe infiltrate. Aspiration would be a consideration. The left lung base  is clear.  Hepatobiliary: No focal hepatic lesions or intrahepatic biliary dilatation numerous gallstones are noted in the gallbladder. No common bile duct dilatation.  Pancreas: Moderate pancreatic atrophy but no mass, inflammation or duct dilatation.  Spleen: Normal size.  No focal lesions.  Adrenals/Urinary Tract: The adrenal glands and kidneys are stable. There is a double-J ureteral stent noted on the left side. No hydronephrosis.  Stomach/Bowel: The stomach, duodenum and small bowel are grossly normal without oral contrast no inflammatory changes, mass lesions or obstructive findings. The colon is distended with air and stool all the way down to the rectum which contains contrast material likely from the prior CT scan. Findings suggest constipation and fecal impaction in the rectum. Suspect benign pneumatosis coli involving the right colon.  Vascular/Lymphatic: Advanced atherosclerotic calcifications involving the aorta and branch vessels. No aneurysm. Bulky retroperitoneal and pelvic lymphadenopathy not significantly change when compartment to prior recent study.  Other: Foley catheter noted in the bladder. Bulky pelvic sidewall adenopathy. Bilateral inguinal adenopathy.  Musculoskeletal: No significant bony findings. Moderate osteoporosis.  IMPRESSION: 1. Large amount of air in stool throughout the colon and down into the rectum suggesting constipation and fecal impaction. 2. Stable bulky retrocrural, retroperitoneal, pelvic and inguinal adenopathy consistent with known lymphoma. No significant interval change since the prior study from 11/25/2014. 3. Cholelithiasis. 4. Right pleural effusion and right lower lobe infiltrate.   Electronically Signed   By: Marijo Sanes M.D.   On: 12/10/2014 18:24  Dg Abd 1 View  12/12/2014   CLINICAL DATA:  Ileus, followup  EXAM: ABDOMEN - 1 VIEW  COMPARISON:  Abdomen film of 12/11/2014 and CT abdomen pelvis of 12/10/2014  FINDINGS: There is very little interval change in  the considerable gaseous distention of the entire colon. In review of the CT recently, there is no evidence of distal distal colonic obstruction but a moderate amount of feces was present at that time within the colon. A left double-J ureteral stent is present and an NG tube extends below the hemidiaphragm.  IMPRESSION: No change in diffuse gaseous distention of the colon.   Electronically Signed   By: Ivar Drape M.D.   On: 12/12/2014 07:53   Dg Abd 1 View  12/11/2014   CLINICAL DATA:  Ileus follow-up  EXAM: ABDOMEN - 1 VIEW  COMPARISON:  CT scan of the chest as well as KUB of December 10, 2014  FINDINGS: There remains significant gaseous distention of much of the colon. There is contrast within the distal sigmoid and in the rectum. No free extraluminal gas collections are observed. The nasogastric tube has been advanced. The left ureteral stent is unchanged in position.  IMPRESSION: Stable appearance of the colon with gaseous distention compatible with diffuse colonic ileus. Contrast remains in the rectosigmoid.   Electronically Signed   By: David  Martinique M.D.   On: 12/11/2014 07:43   Echo - Left ventricle: The cavity size was moderately dilated. There was mild concentric hypertrophy. Systolic function was moderately to severely reduced. The estimated ejection fraction was 35%. Diffuse hypokinesis. Doppler parameters are consistent with abnormal left ventricular relaxation (grade 1 diastolic dysfunction). - Aortic valve: There was severe regurgitation. Valve area (Vmax): 3.45 cm^2. - Mitral valve: There was moderate regurgitation. - Atrial septum: No defect or patent foramen ovale was identified. - Tricuspid valve: There was moderate regurgitation. Impressions: - severe LV dysfunction with left radical ejection fraction approximately 02% and mild diastolic dysfunction. There is severe aortic regurgitation and moderate mitral regurgitation advise cardiac  evaluation.  Assessment/Plan: Caleb Francis is a 69 y.o. male with mantle cell lymphoma undergoing chemo admitted 6/15 from oncology clinic with AMS, chest pain. He was found to have a UTI with U CX + and bacteremia with Klebsiella oxytoca and had been on ceftriaxone until 6/24 when he developed worsening confusion, chills and hemodynamic instability at time of platelet transfusion. He has been neutropenic as well and is getting GCSF with his chemo. He also had metabolic derangement with hypernatremia (resolving) and ,ARF. He has also had an ileus/constipation and has NGT in place. Bcx are from 6/24 with Staph auricularis (a coag negative staph) in 1 of 2.. FU BCX 6/26 NGTD NTP resolved .Rectal tube in place 6/29. Started TPN 6/29  Recommendations He has completed a 2 week course of IV ceftazidime for  Klebsiella oxytoca - dcd ceftazidime 6/29 - monitor for recurrence of neutropenic fevers  GPC bacteremia with Staph auricularis 6/24 -  repeat bcx 6/26 ngtd -Echo neg for Endocarditis but has severe CHF   -Cont vanco for the Staph auricularis bacteremia- rec 2 week course - stop date 7/9. -portacath need not be removed at this time.  Cont supportive care - remains quite ill.  Thank you very much for the consult. Will follow with you.  [place], [place]   12/12/2014, 12:35 PM

## 2014-12-13 ENCOUNTER — Inpatient Hospital Stay: Payer: Medicare Other

## 2014-12-13 LAB — GLUCOSE, CAPILLARY
GLUCOSE-CAPILLARY: 188 mg/dL — AB (ref 65–99)
Glucose-Capillary: 134 mg/dL — ABNORMAL HIGH (ref 65–99)
Glucose-Capillary: 155 mg/dL — ABNORMAL HIGH (ref 65–99)
Glucose-Capillary: 175 mg/dL — ABNORMAL HIGH (ref 65–99)
Glucose-Capillary: 187 mg/dL — ABNORMAL HIGH (ref 65–99)

## 2014-12-13 LAB — CBC WITH DIFFERENTIAL/PLATELET
BASOS PCT: 0 % (ref 0–1)
Band Neutrophils: 4 % (ref 0–10)
Basophils Absolute: 0 10*3/uL (ref 0.0–0.1)
Blasts: 0 %
EOS ABS: 0 10*3/uL (ref 0.0–0.7)
Eosinophils Relative: 0 % (ref 0–5)
HCT: 25.1 % — ABNORMAL LOW (ref 40.0–52.0)
HEMOGLOBIN: 8.2 g/dL — AB (ref 13.0–18.0)
LYMPHS ABS: 2.3 10*3/uL (ref 0.7–4.0)
LYMPHS PCT: 21 % (ref 12–46)
MCH: 28.7 pg (ref 26.0–34.0)
MCHC: 32.7 g/dL (ref 32.0–36.0)
MCV: 87.7 fL (ref 80.0–100.0)
METAMYELOCYTES PCT: 0 %
MONOS PCT: 8 % (ref 3–12)
Monocytes Absolute: 0.9 10*3/uL (ref 0.1–1.0)
Myelocytes: 0 %
NRBC: 0 /100{WBCs}
Neutro Abs: 7.9 10*3/uL — ABNORMAL HIGH (ref 1.7–7.7)
Neutrophils Relative %: 67 % (ref 43–77)
Other: 0 %
PLATELETS: 36 10*3/uL — AB (ref 150–440)
Promyelocytes Absolute: 0 %
RBC: 2.86 MIL/uL — ABNORMAL LOW (ref 4.40–5.90)
RDW: 18.1 % — AB (ref 11.5–14.5)
WBC: 11.1 10*3/uL — ABNORMAL HIGH (ref 3.8–10.6)

## 2014-12-13 LAB — BASIC METABOLIC PANEL
Anion gap: 11 (ref 5–15)
BUN: 64 mg/dL — ABNORMAL HIGH (ref 6–20)
CHLORIDE: 111 mmol/L (ref 101–111)
CO2: 23 mmol/L (ref 22–32)
Calcium: 8.7 mg/dL — ABNORMAL LOW (ref 8.9–10.3)
Creatinine, Ser: 4.26 mg/dL — ABNORMAL HIGH (ref 0.61–1.24)
GFR calc Af Amer: 15 mL/min — ABNORMAL LOW (ref >60)
GFR, EST NON AFRICAN AMERICAN: 13 mL/min — AB (ref >60)
GLUCOSE: 192 mg/dL — AB (ref 65–99)
Potassium: 3.4 mmol/L — ABNORMAL LOW (ref 3.5–5.1)
SODIUM: 145 mmol/L (ref 135–145)

## 2014-12-13 LAB — PREPARE PLATELET PHERESIS: Unit division: 0

## 2014-12-13 LAB — MAGNESIUM: Magnesium: 2.2 mg/dL (ref 1.7–2.4)

## 2014-12-13 LAB — POCT GASTRIC OCCULT BLOOD (1-CARD TO LAB): OCCULT BLOOD, GASTRIC: POSITIVE — AB

## 2014-12-13 LAB — PHOSPHORUS: Phosphorus: 6.2 mg/dL — ABNORMAL HIGH (ref 2.5–4.6)

## 2014-12-13 LAB — VANCOMYCIN, TROUGH: Vancomycin Tr: 35 ug/mL (ref 10–20)

## 2014-12-13 LAB — POTASSIUM: POTASSIUM: 3.6 mmol/L (ref 3.5–5.1)

## 2014-12-13 MED ORDER — DEXTROSE 5 % IV SOLN
Freq: Once | INTRAVENOUS | Status: AC
  Administered 2014-12-13: 11:00:00 via INTRAVENOUS
  Filled 2014-12-13: qty 10

## 2014-12-13 MED ORDER — FAT EMULSION 20 % IV EMUL
500.0000 mL | INTRAVENOUS | Status: AC
  Administered 2014-12-14: 500 mL via INTRAVENOUS
  Filled 2014-12-13: qty 500

## 2014-12-13 MED ORDER — PROMETHAZINE HCL 25 MG/ML IJ SOLN
INTRAMUSCULAR | Status: AC
  Administered 2014-12-13: 25 mg
  Filled 2014-12-13: qty 1

## 2014-12-13 MED ORDER — TRACE MINERALS CR-CU-MN-SE-ZN 10-1000-500-60 MCG/ML IV SOLN
INTRAVENOUS | Status: AC
  Administered 2014-12-13: 18:00:00 via INTRAVENOUS
  Filled 2014-12-13: qty 1440

## 2014-12-13 MED ORDER — VANCOMYCIN HCL IN DEXTROSE 750-5 MG/150ML-% IV SOLN
750.0000 mg | INTRAVENOUS | Status: DC
  Filled 2014-12-13: qty 150

## 2014-12-13 MED ORDER — PROMETHAZINE HCL 25 MG/ML IJ SOLN
25.0000 mg | Freq: Four times a day (QID) | INTRAMUSCULAR | Status: DC | PRN
  Administered 2014-12-13 – 2015-01-16 (×4): 25 mg via INTRAVENOUS
  Filled 2014-12-13 (×3): qty 1

## 2014-12-13 NOTE — Progress Notes (Signed)
Patient vomiting continuously- in small amounts- Dr. Darvin Neighbours made aware- Ordered to replace NG tube and order of phenergan prn. Pt had 63ml of output once NG placed.  Family at bedside.  Pt resting at this time.

## 2014-12-13 NOTE — Clinical Social Work Note (Signed)
Patient remains in ICU, prognosis is poor. Patient from [facility] and to return to [facility] at discharge. Shela Leff MSW,LCSWA [phone]

## 2014-12-13 NOTE — Care Management (Signed)
Important Message  Patient Details  Name: Caleb Francis MRN: [id number] Date of Birth: 08-25-1945   Medicare Important Message Given:  Yes-second notification given    [name] 12/13/2014, 10:29 AM

## 2014-12-13 NOTE — Progress Notes (Signed)
[facility] NOTE  Pharmacy Consult for Vancomycin, TPN Electrolyte Management and Constipation Management Indication: ICU Status   No Known Allergies  Patient Measurements: Height: 6' (182.9 cm) Weight: 143 lb 4.8 oz (65 kg) IBW/kg (Calculated) : 77.6   Vital Signs: Temp: 97.8 F (36.6 C) (07/01 0700) Temp Source: Axillary (07/01 0700) BP: 92/59 mmHg (07/01 0800) Pulse Rate: 117 (07/01 0800) Intake/Output from previous day: 06/30 0701 - 07/01 0700 In: 2076.7 [NG/GT:500; IV Piggyback:300; TPN:1276.7] Out: 2200 [Urine:1050; Emesis/NG output:500; Stool:650] Intake/Output from this shift: Total I/O In: 50 [TPN:50] Out: -  Vent settings for last 24 hours:    Labs:  Recent Labs  12/11/14 0537 12/12/14 0535 12/12/14 1516 12/13/14 0448  WBC 3.6* 6.4  --  11.1*  HGB 8.6* 7.9*  --  8.2*  HCT 25.8* 23.8*  --  25.1*  PLT 10* 16*  --  36*  CREATININE 2.85* 3.73* 3.91* 4.26*  MG 2.0 2.1  --  2.2  PHOS  --  5.0*  --  6.2*  ALBUMIN  --  2.1*  --   --   PROT  --  5.2*  --   --   AST  --  13*  --   --   ALT  --  9*  --   --   ALKPHOS  --  112  --   --   BILITOT  --  0.7  --   --    Estimated Creatinine Clearance: 15.5 mL/min (by C-G formula based on Cr of 4.26). BMP Latest Ref Rng 12/13/2014 12/12/2014 12/12/2014  Glucose 65 - 99 mg/dL 192(H) - 177(H)  BUN 6 - 20 mg/dL 64(H) - 60(H)  Creatinine 0.61 - 1.24 mg/dL 4.26(H) 3.91(H) 3.73(H)  Sodium 135 - 145 mmol/L 145 - 143  Potassium 3.5 - 5.1 mmol/L 3.4(L) 3.2(L) 3.2(L)  Chloride 101 - 111 mmol/L 111 - 112(H)  CO2 22 - 32 mmol/L 23 - 24  Calcium 8.9 - 10.3 mg/dL 8.7(L) - 7.8(L)     Recent Labs  12/12/14 1723 12/13/14 0010 12/13/14 0549  GLUCAP 81 175* 188*    Microbiology: Recent Results (from the past 720 hour(s))  Culture, blood (single)     Status: None   Collection Time: 11/29/14 11:09 AM  Result Value Ref Range Status   Specimen Description BLOOD  Final   Special Requests NONE  Final    Culture  Setup Time   Final    GRAM NEGATIVE RODS AEROBIC BOTTLE ONLY CRITICAL RESULT CALLED TO, READ BACK BY AND VERIFIED WITH: SANDRA VORBA ON 11/30/14 AT 0825 BY JEF    Culture KLEBSIELLA OXYTOCA AEROBIC BOTTLE ONLY   Final   Report Status 12/04/2014 FINAL  Final   Organism ID, Bacteria KLEBSIELLA OXYTOCA  Final      Susceptibility   Klebsiella oxytoca - MIC*    AMPICILLIN >=32 RESISTANT Resistant     CEFTAZIDIME <=1 SENSITIVE Sensitive     CEFAZOLIN >=64 RESISTANT Resistant     CEFTRIAXONE <=1 SENSITIVE Sensitive     CIPROFLOXACIN <=0.25 SENSITIVE Sensitive     GENTAMICIN <=1 SENSITIVE Sensitive     IMIPENEM <=0.25 SENSITIVE Sensitive     TRIMETH/SULFA <=20 SENSITIVE Sensitive     CEFOXITIN <=4 SENSITIVE Sensitive     PIP/TAZO Value in next row Resistant      RESISTANT128    * KLEBSIELLA OXYTOCA  Urine culture     Status: None   Collection Time: 11/29/14 11:22 AM  Result Value Ref Range Status   Specimen Description URINE, CLEAN CATCH  Final   Special Requests NONE  Final   Culture >=100,000 COLONIES/mL KLEBSIELLA OXYTOCA  Final   Report Status 12/01/2014 FINAL  Final   Organism ID, Bacteria KLEBSIELLA OXYTOCA  Final      Susceptibility   Klebsiella oxytoca - MIC*    AMPICILLIN >=32 RESISTANT Resistant     CEFAZOLIN >=64 RESISTANT Resistant     CEFTRIAXONE 4 SENSITIVE Sensitive     CIPROFLOXACIN <=0.25 SENSITIVE Sensitive     GENTAMICIN <=1 SENSITIVE Sensitive     IMIPENEM <=0.25 SENSITIVE Sensitive     NITROFURANTOIN <=16 SENSITIVE Sensitive     TRIMETH/SULFA <=20 SENSITIVE Sensitive     CEFOXITIN <=4 SENSITIVE Sensitive     * >=100,000 COLONIES/mL KLEBSIELLA OXYTOCA  MRSA PCR Screening     Status: None   Collection Time: 11/29/14 12:13 PM  Result Value Ref Range Status   MRSA by PCR NEGATIVE NEGATIVE Final    Comment:        The GeneXpert MRSA Assay (FDA approved for NASAL specimens only), is one component of a comprehensive MRSA colonization surveillance  program. It is not intended to diagnose MRSA infection nor to guide or monitor treatment for MRSA infections.   Culture, blood (routine x 2)     Status: None   Collection Time: 11/29/14  1:19 PM  Result Value Ref Range Status   Specimen Description BLOOD  Final   Special Requests Immunocompromised  Final   Culture NO GROWTH 5 DAYS  Final   Report Status 12/04/2014 FINAL  Final  Urine culture     Status: None   Collection Time: 11/30/14 10:12 AM  Result Value Ref Range Status   Specimen Description URINE, CATHETERIZED  Final   Special Requests Immunocompromised  Final   Culture NO GROWTH 2 DAYS  Final   Report Status 12/02/2014 FINAL  Final  Culture, blood (routine x 2)     Status: None   Collection Time: 12/06/14  8:08 PM  Result Value Ref Range Status   Specimen Description BLOOD  Final   Special Requests NONE  Final   Culture  Setup Time   Final    GRAM POSITIVE COCCI IN BOTH AEROBIC AND ANAEROBIC BOTTLES CRITICAL RESULT CALLED TO, READ BACK BY AND VERIFIED WITH: JENNIFER BEZARD AT 2956 12/08/14.PMH CONFIRMED BY RWW    Culture   Final    STAPHYLOCOCCUS AURICULARIS IN BOTH AEROBIC AND ANAEROBIC BOTTLES    Report Status 12/11/2014 FINAL  Final   Organism ID, Bacteria STAPHYLOCOCCUS AURICULARIS  Final      Susceptibility   Staphylococcus auricularis - MIC*    CIPROFLOXACIN >=8 RESISTANT Resistant     ERYTHROMYCIN >=8 RESISTANT Resistant     GENTAMICIN <=0.5 SENSITIVE Sensitive     OXACILLIN >=4 RESISTANT Resistant     TETRACYCLINE <=1 SENSITIVE Sensitive     VANCOMYCIN 1 SENSITIVE Sensitive     CLINDAMYCIN <=0.25 SENSITIVE Sensitive     TRIMETH/SULFA Value in next row Sensitive      SENSITIVE<=20    LEVOFLOXACIN Value in next row Intermediate      INTERMEDIATE4    * STAPHYLOCOCCUS AURICULARIS  Culture, blood (routine x 2)     Status: None   Collection Time: 12/06/14  8:40 PM  Result Value Ref Range Status   Specimen Description BLOOD  Final   Special Requests NONE   Final   Culture NO GROWTH 5 DAYS  Final  Report Status 12/11/2014 FINAL  Final  Culture, blood (routine x 2)     Status: None (Preliminary result)   Collection Time: 12/08/14 11:40 AM  Result Value Ref Range Status   Specimen Description BLOOD  Final   Special Requests Normal  Final   Culture NO GROWTH 3 DAYS  Final   Report Status PENDING  Incomplete  Culture, blood (routine x 2)     Status: None (Preliminary result)   Collection Time: 12/08/14 11:49 AM  Result Value Ref Range Status   Specimen Description BLOOD  Final   Special Requests Normal  Final   Culture NO GROWTH 3 DAYS  Final   Report Status PENDING  Incomplete    Medications:  Scheduled:  . antiseptic oral rinse  7 mL Mouth Rinse BID  . aspirin  324 mg Oral Once  . bisacodyl  10 mg Rectal Daily  . erythromycin  250 mg Intravenous 3 times per day  . insulin aspart  1-3 Units Subcutaneous Q6H  . lactulose  30 g Oral BID  . methylPREDNISolone (SOLU-MEDROL) injection  60 mg Intravenous Once  . mometasone-formoterol  2 puff Inhalation BID  . potassium chloride  20 mEq Oral BID  . small volume/piggyback builder   Intravenous Once  . scopolamine  1 patch Transdermal Q72H  . sennosides  10 mL Oral BID  . thiamine IV  100 mg Intravenous Daily  . ziprasidone  10 mg Intramuscular QHS   Infusions:  . Marland KitchenTPN (CLINIMIX-E) Adult 50 mL/hr at 12/13/14 0800    Assessment: 69 yo male with progressive mantle cell lymphoma. Patient being treated for electrolyte imbalance and AMS. Patient currently on vancomycin for Staph auricularis bacteremia.   Goal of Therapy:  Resolution of Symptoms  Plan:   1. TPN/Electrolytes: Patient now receiving Clinimix E at 42mL/hr, patient will be changed to Clinimix without electrolytes on 38mL/min. Will order additional potassium chloride 46mEq IV x 1. Will obtain follow-up potassium at 1400. Will follow-up all other electrolytes with am labs. Will need to replace electrolytes outside of TPN and  will need to be mindful of patients fluid status when replacing electrolytes.    2. Constipation: Patient currently with rectal tube and ilues. Patient removed NG tube and per MD will not be replaced. Will leave PO orders active [facility] Patient currently ordered lactulose 71mL BID by neuro. Will continue senna to 17.6mg  VT BID. Will discontinue erythromycin at this time.   3. Vancomycin (day 8): Trough 35. Patient experiencing renal failure and may require dialysis. Will hold vancomycin and obtain follow-up level with am labs. Patient to be on vancomycin through 7/9.    Pharmacy will continue to monitor and adjust per consult.    Currie Paris Clinical Pharmacist 12/13/2014

## 2014-12-13 NOTE — Progress Notes (Signed)
Shared with Dr. Darvin Neighbours that patient had a couple of short SVT runs last night- creatinine functioning worsening at 4.26, Abdominal xray this morning showing improved distention from previous xray.  WBC 11.1- held suppository due to flexiseal in place- Pt had pulled out NG tube and I tried to reinsert and was unable to- pt started projectile vomiting- gave zofran- sent gastric occult down due to dark brown output- results pending.  Pt resting at this time.

## 2014-12-13 NOTE — Progress Notes (Signed)
Subjective:  Remains critically ill UOP 725-->1050 cc. NG output 500 cc stool 650 cc S Cr is worsening significantly Continued on TPN  Objective:  Vital signs in last 24 hours:  Temp:  [97.5 F (36.4 C)-98.5 F (36.9 C)] 97.8 F (36.6 C) (07/01 0700) Pulse Rate:  [90-118] 117 (07/01 0800) Resp:  [17-30] 30 (07/01 0800) BP: (90-163)/(53-111) 92/59 mmHg (07/01 0800) SpO2:  [97 %-100 %] 100 % (07/01 0800) Weight:  [65 kg (143 lb 4.8 oz)] 65 kg (143 lb 4.8 oz) (07/01 0500)  Weight change: -2.5 kg (-5 lb 8.2 oz) Filed Weights   12/11/14 0700 12/12/14 0500 12/13/14 0500  Weight: 69.9 kg (154 lb 1.6 oz) 67.5 kg (148 lb 13 oz) 65 kg (143 lb 4.8 oz)    Intake/Output: I/O last 3 completed shifts: In: 3476.7 [I.V.:900; NG/GT:500; IV Piggyback:500] Out: 3200 [Urine:1550; Emesis/NG output:1000; Stool:650]   Intake/Output this shift:  Total I/O In: 160 [IV Piggyback:110; TPN:50] Out: -   Physical Exam: General: lethargic, ill appearing  Head:  NG in place  Eyes: Anicteric  Neck: Supple, trachea midline  Lungs:  Clear to auscultation normal effort  Heart: S1S2 tachycardic  Abdomen:  significantly distended, mild diffuse tenderness, decreased bowel sounds  Extremities: + dependent peripheral edema  Neurologic:  able to verbalize at times,   Skin: No acute rashes  Access:   Foley with clear urine  Basic Metabolic Panel:  Recent Labs Lab 12/08/14 0530 12/09/14 0509  12/10/14 0500  12/11/14 0537 12/11/14 1723 12/12/14 0535 12/12/14 1516 12/13/14 0448  NA 155* 153*  --  149*  --  145  --  143  --  145  K 3.9 3.1*  < > 3.2*  < > 3.2* 3.5 3.2* 3.2* 3.4*  CL 126* 125*  --  120*  --  115*  --  112*  --  111  CO2 20* 22  --  24  --  24  --  24  --  23  GLUCOSE 184* 131*  --  132*  --  161*  --  177*  --  192*  BUN 64* 53*  --  41*  --  49*  --  60*  --  64*  CREATININE 2.63* 2.25*  --  1.92*  --  2.85*  --  3.73* 3.91* 4.26*  CALCIUM 8.5* 8.5*  --  8.6*  --  7.8*  --  7.8*   --  8.7*  MG 2.4 2.1  --  1.9  --  2.0  --  2.1  --  2.2  PHOS 6.0* 3.2  --  3.1  --   --   --  5.0*  --  6.2*  < > = values in this interval not displayed.  Liver Function Tests:  Recent Labs Lab 12/12/14 0535  AST 13*  ALT 9*  ALKPHOS 112  BILITOT 0.7  PROT 5.2*  ALBUMIN 2.1*   No results for input(s): LIPASE, AMYLASE in the last 168 hours. No results for input(s): AMMONIA in the last 168 hours.  CBC:  Recent Labs Lab 12/09/14 1242 12/10/14 0500 12/11/14 0537 12/12/14 0535 12/13/14 0448  WBC 1.3* 2.1* 3.6* 6.4 11.1*  NEUTROABS  --  0.9* 2.8 5.0 7.9*  HGB 9.3* 8.9* 8.6* 7.9* 8.2*  HCT 27.8* 26.5* 25.8* 23.8* 25.1*  MCV 85.9 86.2 87.4 87.7 87.7  PLT 7* 13* 10* 16* 36*    Cardiac Enzymes: No results for input(s): CKTOTAL, CKMB, CKMBINDEX, TROPONINI in the last  168 hours.  BNP: Invalid input(s): POCBNP  CBG:  Recent Labs Lab 12/12/14 0536 12/12/14 1145 12/12/14 1723 12/13/14 0010 12/13/14 0549  GLUCAP 164* 171* 14 175* 188*    Microbiology: Results for orders placed or performed during the hospital encounter of 11/29/14  MRSA PCR Screening     Status: None   Collection Time: 11/29/14 12:13 PM  Result Value Ref Range Status   MRSA by PCR NEGATIVE NEGATIVE Final    Comment:        The GeneXpert MRSA Assay (FDA approved for NASAL specimens only), is one component of a comprehensive MRSA colonization surveillance program. It is not intended to diagnose MRSA infection nor to guide or monitor treatment for MRSA infections.   Culture, blood (routine x 2)     Status: None   Collection Time: 11/29/14  1:19 PM  Result Value Ref Range Status   Specimen Description BLOOD  Final   Special Requests Immunocompromised  Final   Culture NO GROWTH 5 DAYS  Final   Report Status 12/04/2014 FINAL  Final  Urine culture     Status: None   Collection Time: 11/30/14 10:12 AM  Result Value Ref Range Status   Specimen Description URINE, CATHETERIZED  Final    Special Requests Immunocompromised  Final   Culture NO GROWTH 2 DAYS  Final   Report Status 12/02/2014 FINAL  Final  Culture, blood (routine x 2)     Status: None   Collection Time: 12/06/14  8:08 PM  Result Value Ref Range Status   Specimen Description BLOOD  Final   Special Requests NONE  Final   Culture  Setup Time   Final    GRAM POSITIVE COCCI IN BOTH AEROBIC AND ANAEROBIC BOTTLES CRITICAL RESULT CALLED TO, READ BACK BY AND VERIFIED WITH: JENNIFER BEZARD AT 9798 12/08/14.PMH CONFIRMED BY [facility]    Culture   Final    STAPHYLOCOCCUS AURICULARIS IN BOTH AEROBIC AND ANAEROBIC BOTTLES    Report Status 12/11/2014 FINAL  Final   Organism ID, Bacteria STAPHYLOCOCCUS AURICULARIS  Final      Susceptibility   Staphylococcus auricularis - MIC*    CIPROFLOXACIN >=8 RESISTANT Resistant     ERYTHROMYCIN >=8 RESISTANT Resistant     GENTAMICIN <=0.5 SENSITIVE Sensitive     OXACILLIN >=4 RESISTANT Resistant     TETRACYCLINE <=1 SENSITIVE Sensitive     VANCOMYCIN 1 SENSITIVE Sensitive     CLINDAMYCIN <=0.25 SENSITIVE Sensitive     TRIMETH/SULFA Value in next row Sensitive      SENSITIVE<=20    LEVOFLOXACIN Value in next row Intermediate      INTERMEDIATE4    * STAPHYLOCOCCUS AURICULARIS  Culture, blood (routine x 2)     Status: None   Collection Time: 12/06/14  8:40 PM  Result Value Ref Range Status   Specimen Description BLOOD  Final   Special Requests NONE  Final   Culture NO GROWTH 5 DAYS  Final   Report Status 12/11/2014 FINAL  Final  Culture, blood (routine x 2)     Status: None (Preliminary result)   Collection Time: 12/08/14 11:40 AM  Result Value Ref Range Status   Specimen Description BLOOD  Final   Special Requests Normal  Final   Culture NO GROWTH 3 DAYS  Final   Report Status PENDING  Incomplete  Culture, blood (routine x 2)     Status: None (Preliminary result)   Collection Time: 12/08/14 11:49 AM  Result Value Ref Range Status  Specimen Description BLOOD  Final    Special Requests Normal  Final   Culture NO GROWTH 3 DAYS  Final   Report Status PENDING  Incomplete    Coagulation Studies: No results for input(s): LABPROT, INR in the last 72 hours.  Urinalysis: No results for input(s): COLORURINE, LABSPEC, PHURINE, GLUCOSEU, HGBUR, BILIRUBINUR, KETONESUR, PROTEINUR, UROBILINOGEN, NITRITE, LEUKOCYTESUR in the last 72 hours.  Invalid input(s): APPERANCEUR    Imaging: Dg Abd 1 View  12/12/2014   CLINICAL DATA:  Ileus, followup  EXAM: ABDOMEN - 1 VIEW  COMPARISON:  Abdomen film of 12/11/2014 and CT abdomen pelvis of 12/10/2014  FINDINGS: There is very little interval change in the considerable gaseous distention of the entire colon. In review of the CT recently, there is no evidence of distal distal colonic obstruction but a moderate amount of feces was present at that time within the colon. A left double-J ureteral stent is present and an NG tube extends below the hemidiaphragm.  IMPRESSION: No change in diffuse gaseous distention of the colon.   Electronically Signed   By: Ivar Drape M.D.   On: 12/12/2014 07:53   Dg Abd Portable 1v  12/13/2014   CLINICAL DATA:  Ileus.  EXAM: PORTABLE ABDOMEN - 1 VIEW  COMPARISON:  December 12, 2014.  FINDINGS: Left-sided ureteral stent is unchanged in position. There is significant improvement involving the gaseous distention of the colon. No significant small bowel dilatation is noted. Mild degenerative changes seen in the lower lumbar spine.  IMPRESSION: Significantly improved colonic distention compared to prior exam. Left-sided ureteral stent is unchanged in position.   Electronically Signed   By: Marijo Conception, M.D.   On: 12/13/2014 10:30     Medications:   . Marland KitchenTPN (CLINIMIX-E) Adult 50 mL/hr at 12/13/14 0800   . antiseptic oral rinse  7 mL Mouth Rinse BID  . aspirin  324 mg Oral Once  . bisacodyl  10 mg Rectal Daily  . erythromycin  250 mg Intravenous 3 times per day  . insulin aspart  1-3 Units Subcutaneous Q6H   . lactulose  30 g Oral BID  . methylPREDNISolone (SOLU-MEDROL) injection  60 mg Intravenous Once  . mometasone-formoterol  2 puff Inhalation BID  . potassium chloride  20 mEq Oral BID  . small volume/piggyback builder   Intravenous Once  . scopolamine  1 patch Transdermal Q72H  . sennosides  10 mL Oral BID  . thiamine IV  100 mg Intravenous Daily  . ziprasidone  10 mg Intramuscular QHS   acetaminophen **OR** acetaminophen, bethanechol, haloperidol lactate, heparin lock flush, heparin lock flush, magnesium hydroxide, metoprolol, morphine injection, ondansetron (ZOFRAN) IV, sodium chloride, sodium chloride, sodium chloride, ziprasidone  Assessment/ Plan:  12 AAM with progressive mantle cell lymphoma, RICE chemo in 11/2014, hx left sided hydronephrosis and hydroureter. S/p ureteral stent placement    1.  Acute renal failure due to ATN. 2.  Sepsis with klebsiella oxytoca. 3.  Hypernatremia.  4.  Hypokalemia.  Plan:  S Cr has worsened again, but UOP picking up again. Levels are getting critically high  Na in normal range now,   Replace Kcl prn (likely GI losses) Leukopenia and thrombocytopenia noted, mgmt per IM/oncology team Discussed possibility of needing dialysis with his son although that would be significant step in escalation of care with major risks such as bleeding from cathter site (low platelets) and hemodynamic instability due to critical illness Continued on TPN - change to formula without lytes as phos is  high. D/w dietician    LOS: 14 Amiyah Shryock 7/1/201610:43 AM

## 2014-12-13 NOTE — Progress Notes (Signed)
Notified Talmage Nap, NP about postive occult gastric output.  No new orders at this time.

## 2014-12-13 NOTE — Progress Notes (Signed)
Subjective: Pt has had good results with rectal tube but abdomen remains distended.  NGT gastric contents appear darker today.  Objective: Vital signs in last 24 hours: Temp:  [97.5 F (36.4 C)-98.5 F (36.9 C)] 97.8 F (36.6 C) (07/01 0700) Pulse Rate:  [90-118] 117 (07/01 0800) Resp:  [16-30] 30 (07/01 0800) BP: (90-163)/(53-111) 92/59 mmHg (07/01 0800) SpO2:  [97 %-100 %] 100 % (07/01 0800) Weight:  [143 lb 4.8 oz (65 kg)] 143 lb 4.8 oz (65 kg) (07/01 0500) Last BM Date: 12/12/14 No LMP for male patient. Body mass index is 19.43 kg/(m^2). General:  Lethargic, arouses to tactile stimulation, but does not converse.   Head:  Normocephalic and atraumatic. Eyes:  +exopthalmos.  Sclera clear, no icterus.   Conjunctiva pink. Mouth:  No deformity or lesions, oropharynx pink & moist. Neck:  Supple; no masses or thyromegaly. Heart:  Regular rate and rhythm; no murmurs, clicks, rubs, or gallops. Abdomen:   +Very faint bowel sounds.  +massive distention slightly improved.  No masses, hepatosplenomegaly or hernias noted.  No guarding or rebound tenderness.   Msk:  Symmetrical Pulses:  Normal pulses noted. Extremities:  1+ pedal edema bilat.  No cyanosis Neurologic:  Nonverbal, resting quietly Skin:  Intact without significant lesions or rashes.  Intake/Output from previous day: 06/30 0701 - 07/01 0700 In: 2076.7 [NG/GT:500; IV Piggyback:300; TPN:1276.7] Out: 2200 [Urine:1050; Emesis/NG output:500; Stool:650]  Lab Results:  Recent Labs  12/11/14 0537 12/12/14 0535 12/13/14 0448  WBC 3.6* 6.4 11.1*  HGB 8.6* 7.9* 8.2*  HCT 25.8* 23.8* 25.1*  PLT 10* 16* 36*   BMET  Recent Labs  12/11/14 0537  12/12/14 0535 12/12/14 1516 12/13/14 0448  NA 145  --  143  --  145  K 3.2*  < > 3.2* 3.2* 3.4*  CL 115*  --  112*  --  111  CO2 24  --  24  --  23  GLUCOSE 161*  --  177*  --  192*  BUN 49*  --  60*  --  64*  CREATININE 2.85*  --  3.73* 3.91* 4.26*  CALCIUM 7.8*  --  7.8*  --   8.7*  < > = values in this interval not displayed. LFT  Recent Labs  12/12/14 0535  PROT 5.2*  ALBUMIN 2.1*  AST 13*  ALT 9*  ALKPHOS 112  BILITOT 0.7   Studies/Results: Dg Abd 1 View  12/12/2014   CLINICAL DATA:  Ileus, followup  EXAM: ABDOMEN - 1 VIEW  COMPARISON:  Abdomen film of 12/11/2014 and CT abdomen pelvis of 12/10/2014  FINDINGS: There is very little interval change in the considerable gaseous distention of the entire colon. In review of the CT recently, there is no evidence of distal distal colonic obstruction but a moderate amount of feces was present at that time within the colon. A left double-J ureteral stent is present and an NG tube extends below the hemidiaphragm.  IMPRESSION: No change in diffuse gaseous distention of the colon.   Electronically Signed   By: Ivar Drape M.D.   On: 12/12/2014 07:53    Assessment: #1  Colonic ileus: Minimal improvement with lactulose & rectal tube.   NGT was pulled out & to be reinserted.  Plan: #1  Continue rectal tube with position changes q2 hrs #2  gastroccult emesis & follow Hgb to look for GI bleed  The care of Caleb Francis will be discussed in direct collaboration with Dr Lucilla Lame, Attending Gastroenterologist.  LOS: 14 days  Vickey Huger  12/13/2014, 8:56 AM [facility]  [employer] [place], [place] [address] Phone: [phone] Fax : [phone]

## 2014-12-13 NOTE — Progress Notes (Signed)
Patient walked around unit 3 times no complaints of chest pain, of shortness of breath.  Groin site showing no more signs of bruising- No bleeding.

## 2014-12-13 NOTE — Progress Notes (Signed)
[place] at [facility]                                                                                                                                                                                            Patient Demographics   Caleb Francis, is a 69 y.o. male, DOB - September 10, 1945, IWP:[id number]  Admit date - 11/29/2014   Admitting Physician Aldean Jewett, MD  Outpatient Primary MD for the patient is No PCP Per Patient   Subjective:   No concerns from patient. Awake but drowzy. NG tube out.  Review of Systems:   ROS Poor historian. Confused  Vitals:   Filed Vitals:   12/13/14 0700 12/13/14 0800 12/13/14 0900 12/13/14 1000  BP: 128/71 92/59 96/77    Pulse: 118 117 115 117  Temp: 97.8 F (36.6 C)     TempSrc: Axillary     Resp: 19 30 26 24   Height:      Weight:      SpO2: 100% 100% 100% 100%    Wt Readings from Last 3 Encounters:  12/13/14 65 kg (143 lb 4.8 oz)  11/27/14 68.2 kg (150 lb 5.7 oz)  11/25/14 70.761 kg (156 lb)     Intake/Output Summary (Last 24 hours) at 12/13/14 1255 Last data filed at 12/13/14 1037  Gross per 24 hour  Intake 2236.67 ml  Output   2000 ml  Net 236.67 ml    Physical Exam:   GENERAL: critically ill-appearing PERRLA Atraumatic  NECK: Supple. There is no jugular venous distention. No bruits, no lymphadenopathy, no thyromegaly.  HEART:  Tachycardic 2/6 systolic ejection murmur no rubs LUNGS: Bilateral rhonchi and wheezing.  ABDOMEN: Distended bowel hypoactive without rebound or guarding  EXTREMITIES: No evidence of any cyanosis, clubbing,   NEUROLOGIC: Lethargic  SKIN: Moist and warm with no rashes appreciated.  Psych:    no agitation reported overnight.  Radiology Reports 12/10/2014: CT abdomen: Large distended colonic ileus. No obstruction  12/12/2014: KUB   CBC  Recent Labs Lab 12/09/14 1242 12/10/14 0500 12/11/14 0537 12/12/14 0535 12/13/14 0448  WBC 1.3* 2.1* 3.6* 6.4  11.1*  HGB 9.3* 8.9* 8.6* 7.9* 8.2*  HCT 27.8* 26.5* 25.8* 23.8* 25.1*  PLT 7* 13* 10* 16* 36*  MCV 85.9 86.2 87.4 87.7 87.7  MCH 28.7 28.9 29.1 29.0 28.7  MCHC 33.4 33.6 33.3 33.1 32.7  RDW 17.9* 17.7* 18.0* 18.2* 18.1*  LYMPHSABS  --  0.9 0.4* 1.2 2.3  MONOABS  --  0.1 0.4 0.1 0.9  EOSABS  --  0.0 0.0 0.1 0.0  BASOSABS  --  0.0 0.0  0.0 0.0    Chemistries   Recent Labs Lab 12/09/14 0509  12/10/14 0500  12/11/14 0537 12/11/14 1723 12/12/14 0535 12/12/14 1516 12/13/14 0448  NA 153*  --  149*  --  145  --  143  --  145  K 3.1*  < > 3.2*  < > 3.2* 3.5 3.2* 3.2* 3.4*  CL 125*  --  120*  --  115*  --  112*  --  111  CO2 22  --  24  --  24  --  24  --  23  GLUCOSE 131*  --  132*  --  161*  --  177*  --  192*  BUN 53*  --  41*  --  49*  --  60*  --  64*  CREATININE 2.25*  --  1.92*  --  2.85*  --  3.73* 3.91* 4.26*  CALCIUM 8.5*  --  8.6*  --  7.8*  --  7.8*  --  8.7*  MG 2.1  --  1.9  --  2.0  --  2.1  --  2.2  AST  --   --   --   --   --   --  13*  --   --   ALT  --   --   --   --   --   --  9*  --   --   ALKPHOS  --   --   --   --   --   --  112  --   --   BILITOT  --   --   --   --   --   --  0.7  --   --   < > = values in this interval not displayed. ------------------------------------------------------------------------------------------------------------------- No results for input(s): TSH, T4TOTAL, T3FREE, THYROIDAB in the last 72 hours.  Invalid input(s): FREET3 ------------------------------------------------------------------------------------------------------------------  Coagulation profile  Recent Labs Lab 12/07/14 2129  INR 1.28     Assessment & Plan  This is a 69 year old male with mantle cell lymphoma undergoing chemotherapy who was admitted June 15 from oncology clinic with altered mental status and chest pain.    # Sepsis: With Klebsiella UTI and bacteremia and Staph bacteremia Patient's urine culture and blood cultures were positive for  Klebsiella on admission. [place]. Repeat blood cx on 12/06/2014 are growing  STAPHYLOCOCCUS AURICULARIS . Vancomycin till 12/21/2014. He was temporarily placed on pressors on 6/24 with the transfusion reaction  but has now been off of this and BP stable.  # altered mental status - This is due to metabolic encephalopathy due to sepsis and urinary tract infection/ARF/Hypernatremia along with ICU delirium. - CT head/MRI brain showing no evidence of acute pathology.  - Mental status still waxing and waning  # chest pain on admission: No EKG changes to suggest ischemia.  Continue to monitor for any further cardiac symptoms  # Mantle cell lymphoma: Patient is on salvage chemotherapy. Patient follows with Dr. Mike Gip.   He is s/p chemo He is on GCSF and no longer neutropenic.  he may no longer require G-CSF, however I will have oncology address this  medication.   # Panctytopenia: Due to chemotherapy and Mantle cell lymphoma, patient is currently on Neupogen. Patient is s/p 2 units PRBC on 6/25 and has received PLT transfusion almost daily. Patient needs premedication prior to any transfusions including Benadryl and Tylenol.    # Acute renal failure due to ATN :  Appreciate nephrology input.Patient's creatinine continues to worsen. As per nephrology if creatinine does not improve may have to consider possible dialysis.  # Thrombocytopenia This is due to sepsis/lymphoma and chemo  # colonic Ileus with GI bleed: CT scan shows colonic ileus that has worsened. Patient is no longer neutropenic. Rectal tube in place. Repeat xray shows some improvement.  # Hypernatremia unclear etiology: Resolved with appropriate fluids.[name] Appreciate nephrology consultation.  # GI bleed: Patient currently has an NG tube placed. We will continue PPI and CBC monitoring. At this time we will continue supportive therapy due to low platelets GI will be unable to do any invasive procedure.  Overall Prognosis is very  poor    DNR  DVT Prophylaxis   SCD's   Lab Results  Component Value Date   PLT 36* 12/13/2014     Total critical care Time Spent in minutes - 35 min     Greater than 50% of time spent in care and coordination  Hillary Bow R M.D on 12/13/2014 at 12:55 PM  Between 7am to 6pm - Pager - [phone]  After 6pm go to [email or web address] - password [email or web address] [facility]   Office  [phone]

## 2014-12-13 NOTE — Progress Notes (Addendum)
Nutrition Follow-up  DOCUMENTATION CODES:  Severe malnutrition in context of acute illness/injury  INTERVENTION:  PN: discussed TPN with MD Candiss Norse. MD would like to change TPN to a formula without electrolytes due to hyperphosphatemia. Only formula without electrolytes is 5%AA/15%Dextrose. Max rate per MD Candiss Norse at present is 60 ml/hr, MD ok with addition of 20% lipids with plan for infusion tomorrow (additional 500 mL). Discussed with pharmacy as well. 5%AA/15%Dextrose at rate of 60 ml/hr provides 1440 mL fluid, 72 g of protein, 1022 kcals. 20% lipids 2 times weekly will provide additional 285 kcals/day on average. Meets >75% of protein needs, >50% of calorie needs. Continue to assess   NUTRITION DIAGNOSIS:  Inadequate oral intake related to inability to eat as evidenced by NPO status. Being addressed via TPN    GOAL:  Patient will meet greater than or equal to 90% of their needs  MONITOR:   (PN, Energy Intake, Anthropometrics, Electrolyte/Renal Profile, Glucose Profile, Digestive System)  ASSESSMENT:  Pt pulled NG tube out, per Doristine Johns RN, MD order to keep out at present.   PN: tolerating 5%AA/20%Dextrose at rate of 50 ml/hr  Digestive System: flexiseal with 650 mL, NG with 500 mL dark output, NG out presently, abdomen softer, colonic ileus improved per abdominal xray report, gastric occult positive  Urine Volume: 550 mL  Electrolyte and Renal Profile:  Recent Labs Lab 12/10/14 0500  12/11/14 0537  12/12/14 0535 12/12/14 1516 12/13/14 0448  BUN 41*  --  49*  --  60*  --  64*  CREATININE 1.92*  --  2.85*  --  3.73* 3.91* 4.26*  NA 149*  --  145  --  143  --  145  K 3.2*  < > 3.2*  < > 3.2* 3.2* 3.4*  MG 1.9  --  2.0  --  2.1  --  2.2  PHOS 3.1  --   --   --  5.0*  --  6.2*  < > = values in this interval not displayed. Glucose Profile:  Recent Labs  12/12/14 1723 12/13/14 0010 12/13/14 0549  GLUCAP 81 175* 188*   Nutritional Anemia Profile:  CBC Latest Ref Rng  12/13/2014 12/12/2014 12/11/2014  WBC 3.8 - 10.6 K/uL 11.1(H) 6.4 3.6(L)  Hemoglobin 13.0 - 18.0 g/dL 8.2(L) 7.9(L) 8.6(L)  Hematocrit 40.0 - 52.0 % 25.1(L) 23.8(L) 25.8(L)  Platelets 150 - 440 K/uL 36(L) 16(LL) 10(LL)    Meds: erythromycin discontinued, unable to give po meds via NG or by mouth at present, novolog  Height:  Ht Readings from Last 1 Encounters:  11/29/14 6' (1.829 m)    Weight:  Wt Readings from Last 1 Encounters:  12/13/14 143 lb 4.8 oz (65 kg)    Filed Weights   12/11/14 0700 12/12/14 0500 12/13/14 0500  Weight: 154 lb 1.6 oz (69.9 kg) 148 lb 13 oz (67.5 kg) 143 lb 4.8 oz (65 kg)     BMI:  Body mass index is 19.43 kg/(m^2).  Estimated Nutritional Needs:  Kcal:  1994-2357kcals, BEE: 1511kcals, TEE: IF 1.1-1.3)(AF 1.2)  Protein:  74-88g protein (1.0-1.2g/kg)  Fluid:  1838-2279mL of fluid (25-38mL/kg)  Diet Order:  Diet NPO time specified .TPN (CLINIMIX-E) Adult TPN (CLINIMIX) Adult without lytes  EDUCATION NEEDS:  No education needs identified at this time   [place], [place], LDN [phone] Pager

## 2014-12-14 ENCOUNTER — Inpatient Hospital Stay: Payer: Medicare Other

## 2014-12-14 LAB — CULTURE, BLOOD (ROUTINE X 2)
CULTURE: NO GROWTH
Culture: NO GROWTH
Special Requests: NORMAL
Special Requests: NORMAL

## 2014-12-14 LAB — CBC WITH DIFFERENTIAL/PLATELET
Band Neutrophils: 5 %
Basophils Absolute: 0 10*3/uL (ref 0–0.1)
Basophils Relative: 0 %
EOS PCT: 0 %
Eosinophils Absolute: 0 10*3/uL (ref 0–0.7)
HCT: 22.4 % — ABNORMAL LOW (ref 40.0–52.0)
HEMOGLOBIN: 7.3 g/dL — AB (ref 13.0–18.0)
Lymphocytes Relative: 14 %
Lymphs Abs: 1.4 10*3/uL (ref 1.0–3.6)
MCH: 28.6 pg (ref 26.0–34.0)
MCHC: 32.6 g/dL (ref 32.0–36.0)
MCV: 87.7 fL (ref 80.0–100.0)
Monocytes Absolute: 0.6 10*3/uL (ref 0.2–1.0)
Monocytes Relative: 6 %
NEUTROS PCT: 75 %
Neutro Abs: 8 10*3/uL — ABNORMAL HIGH (ref 1.4–6.5)
Platelets: 36 10*3/uL — ABNORMAL LOW (ref 150–440)
RBC: 2.55 MIL/uL — ABNORMAL LOW (ref 4.40–5.90)
RDW: 18.2 % — ABNORMAL HIGH (ref 11.5–14.5)
WBC: 10 10*3/uL (ref 3.8–10.6)

## 2014-12-14 LAB — BASIC METABOLIC PANEL
BUN: 83 mg/dL — ABNORMAL HIGH (ref 6–20)
CALCIUM: 8 mg/dL — AB (ref 8.9–10.3)
CHLORIDE: 110 mmol/L (ref 101–111)
CO2: 22 mmol/L (ref 22–32)
Creatinine, Ser: 5.34 mg/dL — ABNORMAL HIGH (ref 0.61–1.24)
GFR calc non Af Amer: 10 mL/min — ABNORMAL LOW (ref >60)
GFR, EST AFRICAN AMERICAN: 12 mL/min — AB (ref >60)
Glucose, Bld: 182 mg/dL — ABNORMAL HIGH (ref 65–99)
POTASSIUM: 3.5 mmol/L (ref 3.5–5.1)

## 2014-12-14 LAB — COMPREHENSIVE METABOLIC PANEL
ALK PHOS: 114 U/L (ref 38–126)
ALT: 9 U/L — ABNORMAL LOW (ref 17–63)
ANION GAP: 7 (ref 5–15)
AST: 12 U/L — ABNORMAL LOW (ref 15–41)
Albumin: 2.1 g/dL — ABNORMAL LOW (ref 3.5–5.0)
BUN: 78 mg/dL — ABNORMAL HIGH (ref 6–20)
CHLORIDE: 112 mmol/L — AB (ref 101–111)
CO2: 24 mmol/L (ref 22–32)
Calcium: 8 mg/dL — ABNORMAL LOW (ref 8.9–10.3)
Creatinine, Ser: 4.91 mg/dL — ABNORMAL HIGH (ref 0.61–1.24)
GFR calc Af Amer: 13 mL/min — ABNORMAL LOW (ref >60)
GFR calc non Af Amer: 11 mL/min — ABNORMAL LOW (ref >60)
Glucose, Bld: 158 mg/dL — ABNORMAL HIGH (ref 65–99)
POTASSIUM: 3.1 mmol/L — AB (ref 3.5–5.1)
SODIUM: 143 mmol/L (ref 135–145)
Total Bilirubin: 0.6 mg/dL (ref 0.3–1.2)
Total Protein: 5.3 g/dL — ABNORMAL LOW (ref 6.5–8.1)

## 2014-12-14 LAB — GLUCOSE, CAPILLARY
GLUCOSE-CAPILLARY: 145 mg/dL — AB (ref 65–99)
GLUCOSE-CAPILLARY: 166 mg/dL — AB (ref 65–99)
Glucose-Capillary: 142 mg/dL — ABNORMAL HIGH (ref 65–99)
Glucose-Capillary: 151 mg/dL — ABNORMAL HIGH (ref 65–99)
Glucose-Capillary: 153 mg/dL — ABNORMAL HIGH (ref 65–99)

## 2014-12-14 LAB — VANCOMYCIN, RANDOM: Vancomycin Rm: 26 ug/mL

## 2014-12-14 LAB — C DIFFICILE QUICK SCREEN W PCR REFLEX
C DIFFICILE (CDIFF) TOXIN: NEGATIVE
C DIFFICLE (CDIFF) ANTIGEN: NEGATIVE
C Diff interpretation: NEGATIVE

## 2014-12-14 LAB — MAGNESIUM: Magnesium: 2.3 mg/dL (ref 1.7–2.4)

## 2014-12-14 LAB — PREPARE RBC (CROSSMATCH)

## 2014-12-14 LAB — HEMOGLOBIN AND HEMATOCRIT, BLOOD
HCT: 23.4 % — ABNORMAL LOW (ref 40.0–52.0)
Hemoglobin: 8 g/dL — ABNORMAL LOW (ref 13.0–18.0)

## 2014-12-14 LAB — PHOSPHORUS: PHOSPHORUS: 5.9 mg/dL — AB (ref 2.5–4.6)

## 2014-12-14 MED ORDER — ALBUTEROL SULFATE (2.5 MG/3ML) 0.083% IN NEBU
2.5000 mg | INHALATION_SOLUTION | RESPIRATORY_TRACT | Status: DC | PRN
  Administered 2014-12-14: 2.5 mg via RESPIRATORY_TRACT
  Filled 2014-12-14: qty 3

## 2014-12-14 MED ORDER — POTASSIUM CHLORIDE 10 MEQ/100ML IV SOLN
10.0000 meq | INTRAVENOUS | Status: AC
  Administered 2014-12-14 (×4): 10 meq via INTRAVENOUS
  Filled 2014-12-14 (×4): qty 100

## 2014-12-14 MED ORDER — PANTOPRAZOLE SODIUM 40 MG IV SOLR
40.0000 mg | Freq: Two times a day (BID) | INTRAVENOUS | Status: DC
  Administered 2014-12-14 – 2015-01-08 (×51): 40 mg via INTRAVENOUS
  Filled 2014-12-14 (×51): qty 40

## 2014-12-14 MED ORDER — TRACE MINERALS CR-CU-MN-SE-ZN 10-1000-500-60 MCG/ML IV SOLN
INTRAVENOUS | Status: AC
  Administered 2014-12-14: 18:00:00 via INTRAVENOUS
  Filled 2014-12-14: qty 1680

## 2014-12-14 MED ORDER — SODIUM CHLORIDE 0.9 % IV SOLN
Freq: Once | INTRAVENOUS | Status: AC
  Administered 2014-12-14: 17:00:00 via INTRAVENOUS

## 2014-12-14 MED ORDER — SODIUM CHLORIDE 0.9 % IV SOLN
Freq: Once | INTRAVENOUS | Status: AC
  Administered 2014-12-14: 01:00:00 via INTRAVENOUS

## 2014-12-14 MED ORDER — SODIUM CHLORIDE 0.9 % IV BOLUS (SEPSIS)
1000.0000 mL | Freq: Once | INTRAVENOUS | Status: AC
  Administered 2014-12-14: 1000 mL via INTRAVENOUS

## 2014-12-14 MED ORDER — VANCOMYCIN HCL IN DEXTROSE 750-5 MG/150ML-% IV SOLN: 750.0000 mg | Freq: Every day | INTRAVENOUS | Status: DC | PRN

## 2014-12-14 NOTE — Progress Notes (Signed)
[facility] Hematology/Oncology Progress Note  Date of admission: 11/29/2014  Hospital day:  12/13/2014  Chief Complaint: Caleb Francis is a 69 y.o. 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  Subjective:  Patient asleep, difficult to arouse. Family by bedside.   Allergies: No Known Allergies  Scheduled Medications: . sodium chloride   Intravenous Once  . antiseptic oral rinse  7 mL Mouth Rinse BID  . bisacodyl  10 mg Rectal Daily  . insulin aspart  1-3 Units Subcutaneous Q6H  . lactulose  30 g Oral BID  . methylPREDNISolone (SOLU-MEDROL) injection  60 mg Intravenous Once  . mometasone-formoterol  2 puff Inhalation BID  . pantoprazole (PROTONIX) IV  40 mg Intravenous Q12H  . potassium chloride  20 mEq Oral BID  . scopolamine  1 patch Transdermal Q72H  . sennosides  10 mL Oral BID  . thiamine IV  100 mg Intravenous Daily  . ziprasidone  10 mg Intramuscular QHS   Review of Systems:  Unable to obtain today. GENERAL: Minimally interactive. Denies pain.  No fever. PERFORMANCE STATUS (ECOG): 4  Physical Exam: Blood pressure 123/67, pulse 46, temperature 98.4 F (36.9 C), temperature source Axillary, resp. rate 13, height 6' (1.829 m), weight 152 lb 5.4 oz (69.1 kg), SpO2 97 %.  GENERAL: Chronically ill appearing gentleman lying in bed in the ICU. MENTAL STATUS:Lethargic HEAD: Thin short graying hair. Normocephalic, atraumatic, face symmetric, no Cushingoid features. EYES: Brown eyes. Pupils equal round and reactive to light and accomodation. No conjunctivitis or scleral icterus. ENT: NG tube in place draining dark black green liquid. Oropharynx clear. Poor dentition.  RESPIRATORY: Upper airway sounds.  Decreased breath sounds at the bases.  No wheezes. CARDIOVASCULAR: Regular rate and rhythm without murmur, rub or gallop. ABDOMEN:  Distended.  Soft, bowel sounds, and no hepatosplenomegaly. No tenderness. No guarding or rebound tenderness.  No masses.  Rectal tube in place with brown stool SKIN: No rashes, ulcers or lesions. EXTREMITIES: No edema, no skin discoloration or tenderness. No palpable cords. NEUROLOGICAL:Less interactive. Does not follows commands.  Waxing and waning per nursing.  Results for orders placed or performed during the hospital encounter of 11/29/14 (from the past 48 hour(s))  Glucose, capillary     Status: None   Collection Time: 12/12/14  5:23 PM  Result Value Ref Range   Glucose-Capillary 81 65 - 99 mg/dL  Glucose, capillary     Status: Abnormal   Collection Time: 12/13/14 12:10 AM  Result Value Ref Range   Glucose-Capillary 175 (H) 65 - 99 mg/dL  CBC with Differential     Status: Abnormal   Collection Time: 12/13/14  4:48 AM  Result Value Ref Range   WBC 11.1 (H) 3.8 - 10.6 K/uL   RBC 2.86 (L) 4.40 - 5.90 MIL/uL   Hemoglobin 8.2 (L) 13.0 - 18.0 g/dL   HCT 25.1 (L) 40.0 - 52.0 %   MCV 87.7 80.0 - 100.0 fL   MCH 28.7 26.0 - 34.0 pg   MCHC 32.7 32.0 - 36.0 g/dL   RDW 18.1 (H) 11.5 - 14.5 %   Platelets 36 (L) 150 - 440 K/uL   Neutrophils Relative % 67 43 - 77 %   Lymphocytes Relative 21 12 - 46 %   Monocytes Relative 8 3 - 12 %   Eosinophils Relative 0 0 - 5 %   Basophils Relative 0  0 - 1 %   Band Neutrophils 4 0 - 10 %   Metamyelocytes Relative 0 %   Myelocytes 0 %   Promyelocytes Absolute 0 %   Blasts 0 %   nRBC 0 0 /100 WBC   Other 0 %   Neutro Abs 7.9 (H) 1.7 - 7.7 K/uL   Lymphs Abs 2.3 0.7 - 4.0 K/uL   Monocytes Absolute 0.9 0.1 - 1.0 K/uL   Eosinophils Absolute 0.0 0.0 - 0.7 K/uL   Basophils Absolute 0.0 0.0 - 0.1 K/uL   RBC Morphology MIXED RBC POPULATION     Comment: POLYCHROMASIA PRESENT   WBC Morphology DOHLE BODIES   Basic metabolic panel     Status: Abnormal   Collection Time: 12/13/14  4:48 AM  Result Value Ref Range   Sodium 145 135 - 145 mmol/L    Potassium 3.4 (L) 3.5 - 5.1 mmol/L   Chloride 111 101 - 111 mmol/L   CO2 23 22 - 32 mmol/L   Glucose, Bld 192 (H) 65 - 99 mg/dL   BUN 64 (H) 6 - 20 mg/dL   Creatinine, Ser 4.26 (H) 0.61 - 1.24 mg/dL   Calcium 8.7 (L) 8.9 - 10.3 mg/dL   GFR calc non Af Amer 13 (L) >60 mL/min   GFR calc Af Amer 15 (L) >60 mL/min    Comment: (NOTE) The eGFR has been calculated using the CKD EPI equation. This calculation has not been validated in all clinical situations. eGFR's persistently <60 mL/min signify possible Chronic Kidney Disease.    Anion gap 11 5 - 15  Magnesium     Status: None   Collection Time: 12/13/14  4:48 AM  Result Value Ref Range   Magnesium 2.2 1.7 - 2.4 mg/dL  Phosphorus     Status: Abnormal   Collection Time: 12/13/14  4:48 AM  Result Value Ref Range   Phosphorus 6.2 (H) 2.5 - 4.6 mg/dL  Glucose, capillary     Status: Abnormal   Collection Time: 12/13/14  5:49 AM  Result Value Ref Range   Glucose-Capillary 188 (H) 65 - 99 mg/dL  Vancomycin, trough     Status: Abnormal   Collection Time: 12/13/14  8:42 AM  Result Value Ref Range   Vancomycin Tr 35 (HH) 10 - 20 ug/mL    Comment: CRITICAL RESULT CALLED TO, READ BACK BY AND VERIFIED WITH MICHAEL SIMPSON ON 12/13/14 AT 0910 BY QSD   POCT gastric occult blood(1-card to lab)     Status: Abnormal   Collection Time: 12/13/14  9:38 AM  Result Value Ref Range   pH, Gastric 5 TO 7    Occult Blood, Gastric POSITIVE (A) NEGATIVE  Glucose, capillary     Status: Abnormal   Collection Time: 12/13/14  1:04 PM  Result Value Ref Range   Glucose-Capillary 187 (H) 65 - 99 mg/dL  Potassium     Status: None   Collection Time: 12/13/14  1:24 PM  Result Value Ref Range   Potassium 3.6 3.5 - 5.1 mmol/L  Glucose, capillary     Status: Abnormal   Collection Time: 12/13/14  6:12 PM  Result Value Ref Range   Glucose-Capillary 155 (H) 65 - 99 mg/dL  Glucose, capillary     Status: Abnormal   Collection Time: 12/13/14  7:51 PM  Result Value  Ref Range   Glucose-Capillary 134 (H) 65 - 99 mg/dL  Glucose, capillary     Status: Abnormal   Collection Time: 12/14/14 12:19 AM  Result Value Ref Range   Glucose-Capillary 142 (H) 65 - 99 mg/dL  CBC with Differential     Status: Abnormal   Collection Time: 12/14/14  5:02 AM  Result Value Ref Range   WBC 10.0 3.8 - 10.6 K/uL   RBC 2.55 (L) 4.40 - 5.90 MIL/uL   Hemoglobin 7.3 (L) 13.0 - 18.0 g/dL   HCT 22.4 (L) 40.0 - 52.0 %   MCV 87.7 80.0 - 100.0 fL   MCH 28.6 26.0 - 34.0 pg   MCHC 32.6 32.0 - 36.0 g/dL   RDW 18.2 (H) 11.5 - 14.5 %   Platelets 36 (L) 150 - 440 K/uL   Neutrophils Relative % 75 %   Lymphocytes Relative 14 %   Monocytes Relative 6 %   Eosinophils Relative 0 %   Basophils Relative 0 %   Band Neutrophils 5 %   Neutro Abs 8.0 (H) 1.4 - 6.5 K/uL   Lymphs Abs 1.4 1.0 - 3.6 K/uL   Monocytes Absolute 0.6 0.2 - 1.0 K/uL   Eosinophils Absolute 0.0 0 - 0.7 K/uL   Basophils Absolute 0.0 0 - 0.1 K/uL  Vancomycin, random     Status: None   Collection Time: 12/14/14  5:02 AM  Result Value Ref Range   Vancomycin Rm 26 ug/mL    Comment:        Random Vancomycin therapeutic range is dependent on dosage and time of specimen collection. A peak range is 20.0-40.0 ug/mL A trough range is 5.0-15.0 ug/mL          Magnesium     Status: None   Collection Time: 12/14/14  5:02 AM  Result Value Ref Range   Magnesium 2.3 1.7 - 2.4 mg/dL  Phosphorus     Status: Abnormal   Collection Time: 12/14/14  5:02 AM  Result Value Ref Range   Phosphorus 5.9 (H) 2.5 - 4.6 mg/dL  Comprehensive metabolic panel     Status: Abnormal   Collection Time: 12/14/14  5:02 AM  Result Value Ref Range   Sodium 143 135 - 145 mmol/L   Potassium 3.1 (L) 3.5 - 5.1 mmol/L   Chloride 112 (H) 101 - 111 mmol/L   CO2 24 22 - 32 mmol/L   Glucose, Bld 158 (H) 65 - 99 mg/dL   BUN 78 (H) 6 - 20 mg/dL   Creatinine, Ser 4.91 (H) 0.61 - 1.24 mg/dL   Calcium 8.0 (L) 8.9 - 10.3 mg/dL   Total Protein 5.3 (L) 6.5 -  8.1 g/dL   Albumin 2.1 (L) 3.5 - 5.0 g/dL   AST 12 (L) 15 - 41 U/L   ALT 9 (L) 17 - 63 U/L   Alkaline Phosphatase 114 38 - 126 U/L   Total Bilirubin 0.6 0.3 - 1.2 mg/dL   GFR calc non Af Amer 11 (L) >60 mL/min   GFR calc Af Amer 13 (L) >60 mL/min    Comment: (NOTE) The eGFR has been calculated using the CKD EPI equation. This calculation has not been validated in all clinical situations. eGFR's persistently <60 mL/min signify possible Chronic Kidney Disease.    Anion gap 7 5 - 15  Glucose, capillary     Status: Abnormal   Collection Time: 12/14/14  5:26 AM  Result Value Ref Range   Glucose-Capillary 145 (H) 65 - 99 mg/dL  C difficile quick scan w PCR reflex ([facility] only)     Status: None   Collection Time: 12/14/14 10:11 AM  Result Value Ref  Range   C Diff antigen NEGATIVE    C Diff toxin NEGATIVE    C Diff interpretation Negative for C. difficile   Glucose, capillary     Status: Abnormal   Collection Time: 12/14/14 10:56 AM  Result Value Ref Range   Glucose-Capillary 153 (H) 65 - 99 mg/dL  Type and screen     Status: None (Preliminary result)   Collection Time: 12/14/14  1:23 PM  Result Value Ref Range   ABO/RH(D) O POS    Antibody Screen PENDING    Sample Expiration 12/17/2014   Prepare RBC     Status: None   Collection Time: 12/14/14  1:23 PM  Result Value Ref Range   Order Confirmation ORDER PROCESSED BY BLOOD BANK    Dg Abd 1 View  12/14/2014   CLINICAL DATA:  Ileus  EXAM: ABDOMEN - 1 VIEW  COMPARISON:  12/13/2014  FINDINGS: NG tube tip is in the distal stomach. Left ureteral stent remains in place, unchanged. Small left lower pole renal stone noted.  Mild gaseous distention of bowel loops in the lower at the, likely distal small bowel loops and proximal colon. Decreasing distention of the transverse colon. Otherwise no real change. No free air organomegaly.  IMPRESSION: NG tube tip in the distal stomach.  Slight decreased distention of the colon with stable mild gaseous  distention of bowel loops in the lower abdomen and upper pelvis.   Electronically Signed   By: Rolm Baptise M.D.   On: 12/14/2014 09:43   Dg Abd Portable 1v  12/13/2014   CLINICAL DATA:  Ileus.  EXAM: PORTABLE ABDOMEN - 1 VIEW  COMPARISON:  December 12, 2014.  FINDINGS: Left-sided ureteral stent is unchanged in position. There is significant improvement involving the gaseous distention of the colon. No significant small bowel dilatation is noted. Mild degenerative changes seen in the lower lumbar spine.  IMPRESSION: Significantly improved colonic distention compared to prior exam. Left-sided ureteral stent is unchanged in position.   Electronically Signed   By: Marijo Conception, M.D.   On: 12/13/2014 10:30    Assessment:  Caleb Francis is a 69 y.o. 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 He was admitted with Klebsiella sepsis due to pyelonephritis and associated altered mental status. He subsequently developed renal insufficiency with assciated hypernatremia. He remains in the ICU. He has some component of ICU psychosis.  He had a platelet transfusion reaction on 12/06/2014. Blood cultures from 12/06/2014 grew staph auricularis (coag negative staph) . He had an upper GI bleed on 12/07/2014. Hematocrit is stable. Platelets adequate today.  WBC and ANC are normal off GCSF.  He has an ileus and is s/p rectal tube placement on 12/12/2014. Hypernatremia has resolved.  Renal failure continues to worsen.  He less interactive.  Plan: 1. Hematology/Oncology: Day 21 s/p chemotherapy. WBC is normal.  Patient no longer neutropenic.GCSF discontinued 06/30.  Platelets remain above above 20,000 without transfusion.  Patient's hemoglobin has decreased and he is receiving 1 unit of packed red blood cells today. Continue to monitor daily CBC. Premed all blood products with Tylenol and Benadryl. 2. Infectious disease: Klebsiella oxytoca bacteremia treated.   Cetazidime course completed.  Vancomycin continues until 07/09 for coag negative staph.  No evidence of endocarditis by echo. No further positive blood cultures. 3. Nephrology: Hypernatremia resolved.  Renal insufficiency worse.  Consideration of dialysis per nephology.  All medications renally adjusted. 4. Neurology:  Lethargic and less interactive.  Patient with metabolic encephalopathy, worsening renal function.   5. Gastroenterology: UGI bleed resolved. Ileus s/p rectal tube.  TPN started 06/30. 6. Disposition: Prognosis guarded.  Unclear if patient will return to baseline to be able to proceed to transplant.   7. DNR code status.  Will follow.  Lloyd Huger, MD  12/14/2014, 4:32 PM

## 2014-12-14 NOTE — Progress Notes (Signed)
Caleb Francis  Date of admission: 11/29/2014  Hospital day:  12/13/2014  Chief Complaint: Caleb Francis is a 69 y.o. 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  Subjective:  Drowsy.  Says "no" to pain.  No other verbalization.  Does not follow commands.   Social History: The patient is alone today.  Allergies: No Known Allergies  Scheduled Medications: . antiseptic oral rinse  7 mL Mouth Rinse BID  . aspirin  324 mg Oral Once  . bisacodyl  10 mg Rectal Daily  . insulin aspart  1-3 Units Subcutaneous Q6H  . lactulose  30 g Oral BID  . methylPREDNISolone (SOLU-MEDROL) injection  60 mg Intravenous Once  . mometasone-formoterol  2 puff Inhalation BID  . potassium chloride  20 mEq Oral BID  . scopolamine  1 patch Transdermal Q72H  . sennosides  10 mL Oral BID  . thiamine IV  100 mg Intravenous Daily  . vancomycin  750 mg Intravenous Q48H  . ziprasidone  10 mg Intramuscular QHS   Review of Systems: GENERAL: Minimally interactive. Denies pain.  No fever. PERFORMANCE STATUS (ECOG): 4  Physical Exam: Blood pressure 102/66, pulse 111, temperature 98.3 F (36.8 C), temperature source Axillary, resp. rate 25, height 6' (1.829 m), weight 143 lb 4.8 oz (65 kg), SpO2 100 %.  GENERAL: Chronically ill appearing gentleman lying in bed in the ICU. MENTAL STATUS:Open eyes minimally with prompting. Answers q"no" to pain. HEAD: Thin short graying hair. Normocephalic, atraumatic, face symmetric, no Cushingoid features. EYES: Brown eyes. Pupils equal round and reactive to light and accomodation. No conjunctivitis or scleral icterus. ENT: NG tube in place draining dark black green liquid. Oropharynx clear. Poor dentition.  RESPIRATORY: Upper airway sounds.  Decreased breath sounds at the bases.  No wheezes. CARDIOVASCULAR:  Regular rate and rhythm without murmur, rub or gallop. ABDOMEN: Distended.  Soft, bowel sounds, and no hepatosplenomegaly. No tenderness. No guarding or rebound tenderness.  No masses.  Rectal tube in place with brown stool SKIN: No rashes, ulcers or lesions. EXTREMITIES: No edema, no skin discoloration or tenderness. No palpable cords. NEUROLOGICAL:Less interactive. Does not follows commands.  Waxing and waning per nursing.  Results for orders placed or performed during the hospital encounter of 11/29/14 (from the past 48 hour(s))  CBC with Differential     Status: Abnormal   Collection Time: 12/12/14  5:35 AM  Result Value Ref Range   WBC 6.4 3.8 - 10.6 K/uL   RBC 2.71 (L) 4.40 - 5.90 MIL/uL   Hemoglobin 7.9 (L) 13.0 - 18.0 g/dL   HCT 23.8 (L) 40.0 - 52.0 %   MCV 87.7 80.0 - 100.0 fL   MCH 29.0 26.0 - 34.0 pg   MCHC 33.1 32.0 - 36.0 g/dL   RDW 18.2 (H) 11.5 - 14.5 %   Platelets 16 (LL) 150 - 440 K/uL    Comment: RESULT REPEATED AND VERIFIED CRITICAL VALUE NOTED.  VALUE IS CONSISTENT WITH PREVIOUSLY REPORTED AND CALLED VALUE.    Neutrophils Relative % 70 43 - 77 %   Lymphocytes Relative 18 12 - 46 %   Monocytes Relative 2 (L) 3 - 12 %   Eosinophils Relative 2 0 - 5 %   Basophils Relative 0 0 - 1 %   Band Neutrophils 7 0 - 10 %   Metamyelocytes Relative 1 %   Myelocytes  0 %   Promyelocytes Absolute 0 %   Blasts 0 %   nRBC 0 0 /100 WBC   Other 0 %   Neutro Abs 5.0 1.7 - 7.7 K/uL   Lymphs Abs 1.2 0.7 - 4.0 K/uL   Monocytes Absolute 0.1 0.1 - 1.0 K/uL   Eosinophils Absolute 0.1 0.0 - 0.7 K/uL   Basophils Absolute 0.0 0.0 - 0.1 K/uL   RBC Morphology POLYCHROMASIA PRESENT    WBC Morphology VACUOLATED NEUTROPHILS   Comprehensive metabolic panel     Status: Abnormal   Collection Time: 12/12/14  5:35 AM  Result Value Ref Range   Sodium 143 135 - 145 mmol/L   Potassium 3.2 (L) 3.5 - 5.1 mmol/L   Chloride 112 (H) 101 - 111 mmol/L   CO2 24 22 - 32 mmol/L   Glucose, Bld 177  (H) 65 - 99 mg/dL   BUN 60 (H) 6 - 20 mg/dL   Creatinine, Ser 3.73 (H) 0.61 - 1.24 mg/dL   Calcium 7.8 (L) 8.9 - 10.3 mg/dL   Total Protein 5.2 (L) 6.5 - 8.1 g/dL   Albumin 2.1 (L) 3.5 - 5.0 g/dL   AST 13 (L) 15 - 41 U/L   ALT 9 (L) 17 - 63 U/L   Alkaline Phosphatase 112 38 - 126 U/L   Total Bilirubin 0.7 0.3 - 1.2 mg/dL   GFR calc non Af Amer 15 (L) >60 mL/min   GFR calc Af Amer 18 (L) >60 mL/min    Comment: (Francis) The eGFR has been calculated using the CKD EPI equation. This calculation has not been validated in all clinical situations. eGFR's persistently <60 mL/min signify possible Chronic Kidney Disease.    Anion gap 7 5 - 15  Prealbumin     Status: Abnormal   Collection Time: 12/12/14  5:35 AM  Result Value Ref Range   Prealbumin 9.8 (L) 18 - 38 mg/dL    Comment: Performed at [facility]  Magnesium     Status: None   Collection Time: 12/12/14  5:35 AM  Result Value Ref Range   Magnesium 2.1 1.7 - 2.4 mg/dL  Phosphorus     Status: Abnormal   Collection Time: 12/12/14  5:35 AM  Result Value Ref Range   Phosphorus 5.0 (H) 2.5 - 4.6 mg/dL  Triglycerides     Status: None   Collection Time: 12/12/14  5:35 AM  Result Value Ref Range   Triglycerides 139 <150 mg/dL  Glucose, capillary     Status: Abnormal   Collection Time: 12/12/14  5:36 AM  Result Value Ref Range   Glucose-Capillary 164 (H) 65 - 99 mg/dL  Glucose, capillary     Status: Abnormal   Collection Time: 12/12/14 11:45 AM  Result Value Ref Range   Glucose-Capillary 171 (H) 65 - 99 mg/dL  Prepare Pheresed Platelets     Status: None   Collection Time: 12/12/14 12:55 PM  Result Value Ref Range   Unit Number [id number]    Blood Component Type PLTP LI1 PAS    Unit division 00    Status of Unit ISSUED,FINAL    Transfusion Status OK TO TRANSFUSE   Creatinine, serum     Status: Abnormal   Collection Time: 12/12/14  3:16 PM  Result Value Ref Range   Creatinine, Ser 3.91 (H) 0.61 - 1.24 mg/dL   GFR  calc non Af Amer 15 (L) >60 mL/min   GFR calc Af Amer 17 (L) >60 mL/min  Comment: (Francis) The eGFR has been calculated using the CKD EPI equation. This calculation has not been validated in all clinical situations. eGFR's persistently <60 mL/min signify possible Chronic Kidney Disease.   Potassium     Status: Abnormal   Collection Time: 12/12/14  3:16 PM  Result Value Ref Range   Potassium 3.2 (L) 3.5 - 5.1 mmol/L  Glucose, capillary     Status: None   Collection Time: 12/12/14  5:23 PM  Result Value Ref Range   Glucose-Capillary 81 65 - 99 mg/dL  Glucose, capillary     Status: Abnormal   Collection Time: 12/13/14 12:10 AM  Result Value Ref Range   Glucose-Capillary 175 (H) 65 - 99 mg/dL  CBC with Differential     Status: Abnormal   Collection Time: 12/13/14  4:48 AM  Result Value Ref Range   WBC 11.1 (H) 3.8 - 10.6 K/uL   RBC 2.86 (L) 4.40 - 5.90 MIL/uL   Hemoglobin 8.2 (L) 13.0 - 18.0 g/dL   HCT 25.1 (L) 40.0 - 52.0 %   MCV 87.7 80.0 - 100.0 fL   MCH 28.7 26.0 - 34.0 pg   MCHC 32.7 32.0 - 36.0 g/dL   RDW 18.1 (H) 11.5 - 14.5 %   Platelets 36 (L) 150 - 440 K/uL   Neutrophils Relative % 67 43 - 77 %   Lymphocytes Relative 21 12 - 46 %   Monocytes Relative 8 3 - 12 %   Eosinophils Relative 0 0 - 5 %   Basophils Relative 0 0 - 1 %   Band Neutrophils 4 0 - 10 %   Metamyelocytes Relative 0 %   Myelocytes 0 %   Promyelocytes Absolute 0 %   Blasts 0 %   nRBC 0 0 /100 WBC   Other 0 %   Neutro Abs 7.9 (H) 1.7 - 7.7 K/uL   Lymphs Abs 2.3 0.7 - 4.0 K/uL   Monocytes Absolute 0.9 0.1 - 1.0 K/uL   Eosinophils Absolute 0.0 0.0 - 0.7 K/uL   Basophils Absolute 0.0 0.0 - 0.1 K/uL   RBC Morphology MIXED RBC POPULATION     Comment: POLYCHROMASIA PRESENT   WBC Morphology DOHLE BODIES   Basic metabolic panel     Status: Abnormal   Collection Time: 12/13/14  4:48 AM  Result Value Ref Range   Sodium 145 135 - 145 mmol/L   Potassium 3.4 (L) 3.5 - 5.1 mmol/L   Chloride 111 101 - 111  mmol/L   CO2 23 22 - 32 mmol/L   Glucose, Bld 192 (H) 65 - 99 mg/dL   BUN 64 (H) 6 - 20 mg/dL   Creatinine, Ser 4.26 (H) 0.61 - 1.24 mg/dL   Calcium 8.7 (L) 8.9 - 10.3 mg/dL   GFR calc non Af Amer 13 (L) >60 mL/min   GFR calc Af Amer 15 (L) >60 mL/min    Comment: (Francis) The eGFR has been calculated using the CKD EPI equation. This calculation has not been validated in all clinical situations. eGFR's persistently <60 mL/min signify possible Chronic Kidney Disease.    Anion gap 11 5 - 15  Magnesium     Status: None   Collection Time: 12/13/14  4:48 AM  Result Value Ref Range   Magnesium 2.2 1.7 - 2.4 mg/dL  Phosphorus     Status: Abnormal   Collection Time: 12/13/14  4:48 AM  Result Value Ref Range   Phosphorus 6.2 (H) 2.5 - 4.6 mg/dL  Glucose, capillary  Status: Abnormal   Collection Time: 12/13/14  5:49 AM  Result Value Ref Range   Glucose-Capillary 188 (H) 65 - 99 mg/dL  Vancomycin, trough     Status: Abnormal   Collection Time: 12/13/14  8:42 AM  Result Value Ref Range   Vancomycin Tr 35 (HH) 10 - 20 ug/mL    Comment: CRITICAL RESULT CALLED TO, READ BACK BY AND VERIFIED WITH MICHAEL SIMPSON ON 12/13/14 AT 0910 BY QSD   POCT gastric occult blood(1-card to lab)     Status: Abnormal   Collection Time: 12/13/14  9:38 AM  Result Value Ref Range   pH, Gastric 5 TO 7    Occult Blood, Gastric POSITIVE (A) NEGATIVE  Glucose, capillary     Status: Abnormal   Collection Time: 12/13/14  1:04 PM  Result Value Ref Range   Glucose-Capillary 187 (H) 65 - 99 mg/dL  Potassium     Status: None   Collection Time: 12/13/14  1:24 PM  Result Value Ref Range   Potassium 3.6 3.5 - 5.1 mmol/L  Glucose, capillary     Status: Abnormal   Collection Time: 12/13/14  6:12 PM  Result Value Ref Range   Glucose-Capillary 155 (H) 65 - 99 mg/dL  Glucose, capillary     Status: Abnormal   Collection Time: 12/13/14  7:51 PM  Result Value Ref Range   Glucose-Capillary 134 (H) 65 - 99 mg/dL  Glucose,  capillary     Status: Abnormal   Collection Time: 12/14/14 12:19 AM  Result Value Ref Range   Glucose-Capillary 142 (H) 65 - 99 mg/dL   Dg Abd 1 View  12/12/2014   CLINICAL DATA:  Ileus, followup  EXAM: ABDOMEN - 1 VIEW  COMPARISON:  Abdomen film of 12/11/2014 and CT abdomen pelvis of 12/10/2014  FINDINGS: There is very little interval change in the considerable gaseous distention of the entire colon. In review of the CT recently, there is no evidence of distal distal colonic obstruction but a moderate amount of feces was present at that time within the colon. A left double-J ureteral stent is present and an NG tube extends below the hemidiaphragm.  IMPRESSION: No change in diffuse gaseous distention of the colon.   Electronically Signed   By: Ivar Drape M.D.   On: 12/12/2014 07:53   Dg Abd Portable 1v  12/13/2014   CLINICAL DATA:  Ileus.  EXAM: PORTABLE ABDOMEN - 1 VIEW  COMPARISON:  December 12, 2014.  FINDINGS: Left-sided ureteral stent is unchanged in position. There is significant improvement involving the gaseous distention of the colon. No significant small bowel dilatation is noted. Mild degenerative changes seen in the lower lumbar spine.  IMPRESSION: Significantly improved colonic distention compared to prior exam. Left-sided ureteral stent is unchanged in position.   Electronically Signed   By: Marijo Conception, M.D.   On: 12/13/2014 10:30    Assessment:  Caleb Francis is a 69 y.o. 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 He was admitted with Klebsiella sepsis due to pyelonephritis and associated altered mental status. He subsequently developed renal insufficiency with assciated hypernatremia. He remains in the ICU. He has some component of ICU psychosis.  He had a platelet transfusion reaction on 12/06/2014. Blood cultures from 12/06/2014 grew staph auricularis (coag negative staph) . He had an upper GI bleed on 12/07/2014. Hematocrit is  stable. Platelets adequate today.  WBC and ANC are normal off GCSF.  He has an  ileus and is s/p rectal tube placement on 12/12/2014. Hypernatremia has resolved.  Renal failure continues to worsen.  He less interactive.  Plan: 1. Hematology/Oncology: Day 17 s/p chemotherapy. WBC is normal.  Patient no longer neutropenic.GCSF discontinued 06/30.  Platelets above 20,000 today for the first time without transfusion.  May be self sustaining.Premed all blood products with Tylenol and Benadryl. 2. Infectious disease: Klebsiella oxytoca bacteremia treated.  Cetazidime course completed.  Vancomycin continues until 07/09 for coag negative staph.  No evidence of endocarditis by echo. No further positive blood cultures. 3. Nephrology: Hypernatremia resolved.  Renal insufficiency worse.  Consideration of dialysis per nephology.  All medications renally adjusted. 4. Neurology:  More lethargic and less interactive.  Patient with metabolic encephalopathy, worsening renal function and ICU psychosis.   5. Gastroenterology: UGI bleed resolved. Ileus s/p rectal tube.  TPN started 06/30. 6. Disposition: Prognosis guarded.  Unckear if patient will return to baseline to be able to proceed to transplant.  DNR code status.  Lequita Asal, MD  12/14/2014, 12:43 AM

## 2014-12-14 NOTE — Progress Notes (Signed)
Subjective:  UOP decreased in the past 24 hours.  Cr up to 4.91 at the moment.  Was significantly net positive yesterday.  Objective:  Vital signs in last 24 hours:  Temp:  [97.5 F (36.4 C)-98.4 F (36.9 C)] 98.4 F (36.9 C) (07/02 1200) Pulse Rate:  [106-125] 106 (07/02 1200) Resp:  [14-30] 21 (07/02 1200) BP: (79-116)/(50-83) 100/71 mmHg (07/02 1200) SpO2:  [92 %-100 %] 99 % (07/02 1200) Weight:  [69.1 kg (152 lb 5.4 oz)] 69.1 kg (152 lb 5.4 oz) (07/02 0500)  Weight change: 4.1 kg (9 lb 0.6 oz) Filed Weights   12/12/14 0500 12/13/14 0500 12/14/14 0500  Weight: 67.5 kg (148 lb 13 oz) 65 kg (143 lb 4.8 oz) 69.1 kg (152 lb 5.4 oz)    Intake/Output: I/O last 3 completed shifts: In: 6153.8 [Other:650; NG/GT:1700; IV Piggyback:1410] Out: 1900 [Urine:900; Emesis/NG output:150; Stool:850]   Intake/Output this shift:  Total I/O In: 350 [IV Piggyback:300; TPN:50] Out: 200 [Urine:200]  Physical Exam: General: lethargic, ill appearing  Head:  NG in place  Eyes: Anicteric  Neck: Supple, trachea midline  Lungs:  Clear to auscultation normal effort  Heart: S1S2 tachycardic  Abdomen:  significantly distended, mild diffuse tenderness, decreased bowel sounds  Extremities: + dependent peripheral edema  Neurologic: Currently awake, not consistently following commands  Skin: No acute rashes  Access:   Foley with scant urine in bag  Basic Metabolic Panel:  Recent Labs Lab 12/09/14 0509  12/10/14 0500  12/11/14 0537  12/12/14 0535 12/12/14 1516 12/13/14 0448 12/13/14 1324 12/14/14 0502  NA 153*  --  149*  --  145  --  143  --  145  --  143  K 3.1*  < > 3.2*  < > 3.2*  < > 3.2* 3.2* 3.4* 3.6 3.1*  CL 125*  --  120*  --  115*  --  112*  --  111  --  112*  CO2 22  --  24  --  24  --  24  --  23  --  24  GLUCOSE 131*  --  132*  --  161*  --  177*  --  192*  --  158*  BUN 53*  --  41*  --  49*  --  60*  --  64*  --  78*  CREATININE 2.25*  --  1.92*  --  2.85*  --  3.73*  3.91* 4.26*  --  4.91*  CALCIUM 8.5*  --  8.6*  --  7.8*  --  7.8*  --  8.7*  --  8.0*  MG 2.1  --  1.9  --  2.0  --  2.1  --  2.2  --  2.3  PHOS 3.2  --  3.1  --   --   --  5.0*  --  6.2*  --  5.9*  < > = values in this interval not displayed.  Liver Function Tests:  Recent Labs Lab 12/12/14 0535 12/14/14 0502  AST 13* 12*  ALT 9* 9*  ALKPHOS 112 114  BILITOT 0.7 0.6  PROT 5.2* 5.3*  ALBUMIN 2.1* 2.1*   No results for input(s): LIPASE, AMYLASE in the last 168 hours. No results for input(s): AMMONIA in the last 168 hours.  CBC:  Recent Labs Lab 12/10/14 0500 12/11/14 0537 12/12/14 0535 12/13/14 0448 12/14/14 0502  WBC 2.1* 3.6* 6.4 11.1* 10.0  NEUTROABS 0.9* 2.8 5.0 7.9* 8.0*  HGB 8.9* 8.6* 7.9* 8.2*  7.3*  HCT 26.5* 25.8* 23.8* 25.1* 22.4*  MCV 86.2 87.4 87.7 87.7 87.7  PLT 13* 10* 16* 36* 36*    Cardiac Enzymes: No results for input(s): CKTOTAL, CKMB, CKMBINDEX, TROPONINI in the last 168 hours.  BNP: Invalid input(s): POCBNP  CBG:  Recent Labs Lab 12/13/14 1812 12/13/14 1951 12/14/14 0019 12/14/14 0526 12/14/14 1056  GLUCAP 155* 134* 142* 145* 153*    Microbiology: Results for orders placed or performed during the hospital encounter of 11/29/14  MRSA PCR Screening     Status: None   Collection Time: 11/29/14 12:13 PM  Result Value Ref Range Status   MRSA by PCR NEGATIVE NEGATIVE Final    Comment:        The GeneXpert MRSA Assay (FDA approved for NASAL specimens only), is one component of a comprehensive MRSA colonization surveillance program. It is not intended to diagnose MRSA infection nor to guide or monitor treatment for MRSA infections.   Culture, blood (routine x 2)     Status: None   Collection Time: 11/29/14  1:19 PM  Result Value Ref Range Status   Specimen Description BLOOD  Final   Special Requests Immunocompromised  Final   Culture NO GROWTH 5 DAYS  Final   Report Status 12/04/2014 FINAL  Final  Urine culture     Status:  None   Collection Time: 11/30/14 10:12 AM  Result Value Ref Range Status   Specimen Description URINE, CATHETERIZED  Final   Special Requests Immunocompromised  Final   Culture NO GROWTH 2 DAYS  Final   Report Status 12/02/2014 FINAL  Final  Culture, blood (routine x 2)     Status: None   Collection Time: 12/06/14  8:08 PM  Result Value Ref Range Status   Specimen Description BLOOD  Final   Special Requests NONE  Final   Culture  Setup Time   Final    GRAM POSITIVE COCCI IN BOTH AEROBIC AND ANAEROBIC BOTTLES CRITICAL RESULT CALLED TO, READ BACK BY AND VERIFIED WITH: JENNIFER BEZARD AT 9924 12/08/14.PMH CONFIRMED BY [facility]    Culture   Final    STAPHYLOCOCCUS AURICULARIS IN BOTH AEROBIC AND ANAEROBIC BOTTLES    Report Status 12/11/2014 FINAL  Final   Organism ID, Bacteria STAPHYLOCOCCUS AURICULARIS  Final      Susceptibility   Staphylococcus auricularis - MIC*    CIPROFLOXACIN >=8 RESISTANT Resistant     ERYTHROMYCIN >=8 RESISTANT Resistant     GENTAMICIN <=0.5 SENSITIVE Sensitive     OXACILLIN >=4 RESISTANT Resistant     TETRACYCLINE <=1 SENSITIVE Sensitive     VANCOMYCIN 1 SENSITIVE Sensitive     CLINDAMYCIN <=0.25 SENSITIVE Sensitive     TRIMETH/SULFA Value in next row Sensitive      SENSITIVE<=20    LEVOFLOXACIN Value in next row Intermediate      INTERMEDIATE4    * STAPHYLOCOCCUS AURICULARIS  Culture, blood (routine x 2)     Status: None   Collection Time: 12/06/14  8:40 PM  Result Value Ref Range Status   Specimen Description BLOOD  Final   Special Requests NONE  Final   Culture NO GROWTH 5 DAYS  Final   Report Status 12/11/2014 FINAL  Final  Culture, blood (routine x 2)     Status: None   Collection Time: 12/08/14 11:40 AM  Result Value Ref Range Status   Specimen Description BLOOD  Final   Special Requests Normal  Final   Culture NO GROWTH 6 DAYS  Final  Report Status 12/14/2014 FINAL  Final  Culture, blood (routine x 2)     Status: None   Collection Time:  12/08/14 11:49 AM  Result Value Ref Range Status   Specimen Description BLOOD  Final   Special Requests Normal  Final   Culture NO GROWTH 6 DAYS  Final   Report Status 12/14/2014 FINAL  Final  C difficile quick scan w PCR reflex ([facility] only)     Status: None   Collection Time: 12/14/14 10:11 AM  Result Value Ref Range Status   C Diff antigen NEGATIVE  Final   C Diff toxin NEGATIVE  Final   C Diff interpretation Negative for C. difficile  Final    Coagulation Studies: No results for input(s): LABPROT, INR in the last 72 hours.  Urinalysis: No results for input(s): COLORURINE, LABSPEC, PHURINE, GLUCOSEU, HGBUR, BILIRUBINUR, KETONESUR, PROTEINUR, UROBILINOGEN, NITRITE, LEUKOCYTESUR in the last 72 hours.  Invalid input(s): APPERANCEUR    Imaging: Dg Abd 1 View  12/14/2014   CLINICAL DATA:  Ileus  EXAM: ABDOMEN - 1 VIEW  COMPARISON:  12/13/2014  FINDINGS: NG tube tip is in the distal stomach. Left ureteral stent remains in place, unchanged. Small left lower pole renal stone noted.  Mild gaseous distention of bowel loops in the lower at the, likely distal small bowel loops and proximal colon. Decreasing distention of the transverse colon. Otherwise no real change. No free air organomegaly.  IMPRESSION: NG tube tip in the distal stomach.  Slight decreased distention of the colon with stable mild gaseous distention of bowel loops in the lower abdomen and upper pelvis.   Electronically Signed   By: Rolm Baptise M.D.   On: 12/14/2014 09:43   Dg Abd Portable 1v  12/13/2014   CLINICAL DATA:  Ileus.  EXAM: PORTABLE ABDOMEN - 1 VIEW  COMPARISON:  December 12, 2014.  FINDINGS: Left-sided ureteral stent is unchanged in position. There is significant improvement involving the gaseous distention of the colon. No significant small bowel dilatation is noted. Mild degenerative changes seen in the lower lumbar spine.  IMPRESSION: Significantly improved colonic distention compared to prior exam. Left-sided ureteral  stent is unchanged in position.   Electronically Signed   By: Marijo Conception, M.D.   On: 12/13/2014 10:30     Medications:   . fat emulsion 500 mL (12/14/14 1250)  . TPN (CLINIMIX) Adult without lytes 60 mL/hr at 12/13/14 1900  . TPN (CLINIMIX) Adult without lytes     . sodium chloride   Intravenous Once  . antiseptic oral rinse  7 mL Mouth Rinse BID  . bisacodyl  10 mg Rectal Daily  . insulin aspart  1-3 Units Subcutaneous Q6H  . lactulose  30 g Oral BID  . methylPREDNISolone (SOLU-MEDROL) injection  60 mg Intravenous Once  . mometasone-formoterol  2 puff Inhalation BID  . pantoprazole (PROTONIX) IV  40 mg Intravenous Q12H  . potassium chloride  20 mEq Oral BID  . scopolamine  1 patch Transdermal Q72H  . sennosides  10 mL Oral BID  . thiamine IV  100 mg Intravenous Daily  . ziprasidone  10 mg Intramuscular QHS   acetaminophen **OR** acetaminophen, albuterol, bethanechol, haloperidol lactate, heparin lock flush, heparin lock flush, magnesium hydroxide, metoprolol, morphine injection, ondansetron (ZOFRAN) IV, promethazine, sodium chloride, sodium chloride, sodium chloride, vancomycin, ziprasidone  Assessment/ Plan:  34 AAM with progressive mantle cell lymphoma, RICE chemo in 11/2014, hx left sided hydronephrosis and hydroureter. S/p ureteral stent placement  1.  Acute renal failure due to ATN. 2.  Sepsis with klebsiella oxytoca. 3.  Hypernatremia.  4.  Hypokalemia.  Plan:  Overall no significant improvement since I last saw the patient. Intact renal function appears to be a bit worse at present with creatinine up to 4.91 and urine output appears to have dropped a bit. No urgent indication for dialysis at present. Patient would make a poor outpatient dialysis candidate given his current condition. Potassium noted to be low, however the patient is on repletion with potassium chloride 20 mEq twice a day. We will also administer small amount of IV potassium. As before the patient's  overall prognosis appears to be quite guarded.   LOS: [place], Kadir Azucena 7/2/20162:05 PM

## 2014-12-14 NOTE — Progress Notes (Addendum)
[place] NOTE  Pharmacy Consult for Vancomycin, TPN Electrolyte Management and Constipation Management Indication: ICU Status   No Known Allergies  Patient Measurements: Height: 6' (182.9 cm) Weight: 152 lb 5.4 oz (69.1 kg) IBW/kg (Calculated) : 77.6   Vital Signs: Temp: 98 F (36.7 C) (07/02 0400) Temp Source: Axillary (07/02 0400) BP: 114/78 mmHg (07/02 0500) Pulse Rate: 116 (07/02 0500) Intake/Output from previous day: 07/01 0701 - 07/02 0700 In: 2508.8 [IV Piggyback:1110; TPN:1398.8] Out: 700 [Urine:350; Emesis/NG output:150; Stool:200] Intake/Output from this shift: Total I/O In: 1600 [IV Piggyback:1000; TPN:600] Out: -  Vent settings for last 24 hours:    Labs:  Recent Labs  12/12/14 0535 12/12/14 1516 12/13/14 0448 12/14/14 0502  WBC 6.4  --  11.1*  --   HGB 7.9*  --  8.2*  --   HCT 23.8*  --  25.1*  --   PLT 16*  --  36*  --   CREATININE 3.73* 3.91* 4.26* 4.91*  MG 2.1  --  2.2 2.3  PHOS 5.0*  --  6.2* 5.9*  ALBUMIN 2.1*  --   --  2.1*  PROT 5.2*  --   --  5.3*  AST 13*  --   --  12*  ALT 9*  --   --  9*  ALKPHOS 112  --   --  114  BILITOT 0.7  --   --  0.6   Estimated Creatinine Clearance: 14.3 mL/min (by C-G formula based on Cr of 4.91). BMP Latest Ref Rng 12/14/2014 12/13/2014 12/13/2014  Glucose 65 - 99 mg/dL 158(H) - 192(H)  BUN 6 - 20 mg/dL 78(H) - 64(H)  Creatinine 0.61 - 1.24 mg/dL 4.91(H) - 4.26(H)  Sodium 135 - 145 mmol/L 143 - 145  Potassium 3.5 - 5.1 mmol/L 3.1(L) 3.6 3.4(L)  Chloride 101 - 111 mmol/L 112(H) - 111  CO2 22 - 32 mmol/L 24 - 23  Calcium 8.9 - 10.3 mg/dL 8.0(L) - 8.7(L)     Recent Labs  12/13/14 1951 12/14/14 0019 12/14/14 0526  GLUCAP 134* 142* 145*    Microbiology: Recent Results (from the past 720 hour(s))  Culture, blood (single)     Status: None   Collection Time: 11/29/14 11:09 AM  Result Value Ref Range Status   Specimen Description BLOOD  Final   Special Requests NONE  Final   Culture  Setup Time   Final    GRAM NEGATIVE RODS AEROBIC BOTTLE ONLY CRITICAL RESULT CALLED TO, READ BACK BY AND VERIFIED WITH: SANDRA VORBA ON 11/30/14 AT 0825 BY JEF    Culture KLEBSIELLA OXYTOCA AEROBIC BOTTLE ONLY   Final   Report Status 12/04/2014 FINAL  Final   Organism ID, Bacteria KLEBSIELLA OXYTOCA  Final      Susceptibility   Klebsiella oxytoca - MIC*    AMPICILLIN >=32 RESISTANT Resistant     CEFTAZIDIME <=1 SENSITIVE Sensitive     CEFAZOLIN >=64 RESISTANT Resistant     CEFTRIAXONE <=1 SENSITIVE Sensitive     CIPROFLOXACIN <=0.25 SENSITIVE Sensitive     GENTAMICIN <=1 SENSITIVE Sensitive     IMIPENEM <=0.25 SENSITIVE Sensitive     TRIMETH/SULFA <=20 SENSITIVE Sensitive     CEFOXITIN <=4 SENSITIVE Sensitive     PIP/TAZO Value in next row Resistant      RESISTANT128    * KLEBSIELLA OXYTOCA  Urine culture     Status: None   Collection Time: 11/29/14 11:22 AM  Result Value Ref Range Status  Specimen Description URINE, CLEAN CATCH  Final   Special Requests NONE  Final   Culture >=100,000 COLONIES/mL KLEBSIELLA OXYTOCA  Final   Report Status 12/01/2014 FINAL  Final   Organism ID, Bacteria KLEBSIELLA OXYTOCA  Final      Susceptibility   Klebsiella oxytoca - MIC*    AMPICILLIN >=32 RESISTANT Resistant     CEFAZOLIN >=64 RESISTANT Resistant     CEFTRIAXONE 4 SENSITIVE Sensitive     CIPROFLOXACIN <=0.25 SENSITIVE Sensitive     GENTAMICIN <=1 SENSITIVE Sensitive     IMIPENEM <=0.25 SENSITIVE Sensitive     NITROFURANTOIN <=16 SENSITIVE Sensitive     TRIMETH/SULFA <=20 SENSITIVE Sensitive     CEFOXITIN <=4 SENSITIVE Sensitive     * >=100,000 COLONIES/mL KLEBSIELLA OXYTOCA  MRSA PCR Screening     Status: None   Collection Time: 11/29/14 12:13 PM  Result Value Ref Range Status   MRSA by PCR NEGATIVE NEGATIVE Final    Comment:        The GeneXpert MRSA Assay (FDA approved for NASAL specimens only), is one component of a comprehensive MRSA colonization surveillance  program. It is not intended to diagnose MRSA infection nor to guide or monitor treatment for MRSA infections.   Culture, blood (routine x 2)     Status: None   Collection Time: 11/29/14  1:19 PM  Result Value Ref Range Status   Specimen Description BLOOD  Final   Special Requests Immunocompromised  Final   Culture NO GROWTH 5 DAYS  Final   Report Status 12/04/2014 FINAL  Final  Urine culture     Status: None   Collection Time: 11/30/14 10:12 AM  Result Value Ref Range Status   Specimen Description URINE, CATHETERIZED  Final   Special Requests Immunocompromised  Final   Culture NO GROWTH 2 DAYS  Final   Report Status 12/02/2014 FINAL  Final  Culture, blood (routine x 2)     Status: None   Collection Time: 12/06/14  8:08 PM  Result Value Ref Range Status   Specimen Description BLOOD  Final   Special Requests NONE  Final   Culture  Setup Time   Final    GRAM POSITIVE COCCI IN BOTH AEROBIC AND ANAEROBIC BOTTLES CRITICAL RESULT CALLED TO, READ BACK BY AND VERIFIED WITH: JENNIFER BEZARD AT 1517 12/08/14.PMH CONFIRMED BY RWW    Culture   Final    STAPHYLOCOCCUS AURICULARIS IN BOTH AEROBIC AND ANAEROBIC BOTTLES    Report Status 12/11/2014 FINAL  Final   Organism ID, Bacteria STAPHYLOCOCCUS AURICULARIS  Final      Susceptibility   Staphylococcus auricularis - MIC*    CIPROFLOXACIN >=8 RESISTANT Resistant     ERYTHROMYCIN >=8 RESISTANT Resistant     GENTAMICIN <=0.5 SENSITIVE Sensitive     OXACILLIN >=4 RESISTANT Resistant     TETRACYCLINE <=1 SENSITIVE Sensitive     VANCOMYCIN 1 SENSITIVE Sensitive     CLINDAMYCIN <=0.25 SENSITIVE Sensitive     TRIMETH/SULFA Value in next row Sensitive      SENSITIVE<=20    LEVOFLOXACIN Value in next row Intermediate      INTERMEDIATE4    * STAPHYLOCOCCUS AURICULARIS  Culture, blood (routine x 2)     Status: None   Collection Time: 12/06/14  8:40 PM  Result Value Ref Range Status   Specimen Description BLOOD  Final   Special Requests NONE   Final   Culture NO GROWTH 5 DAYS  Final   Report Status 12/11/2014 FINAL  Final  Culture, blood (routine x 2)     Status: None (Preliminary result)   Collection Time: 12/08/14 11:40 AM  Result Value Ref Range Status   Specimen Description BLOOD  Final   Special Requests Normal  Final   Culture NO GROWTH 3 DAYS  Final   Report Status PENDING  Incomplete  Culture, blood (routine x 2)     Status: None (Preliminary result)   Collection Time: 12/08/14 11:49 AM  Result Value Ref Range Status   Specimen Description BLOOD  Final   Special Requests Normal  Final   Culture NO GROWTH 3 DAYS  Final   Report Status PENDING  Incomplete    Medications:  Scheduled:  . antiseptic oral rinse  7 mL Mouth Rinse BID  . aspirin  324 mg Oral Once  . bisacodyl  10 mg Rectal Daily  . insulin aspart  1-3 Units Subcutaneous Q6H  . lactulose  30 g Oral BID  . methylPREDNISolone (SOLU-MEDROL) injection  60 mg Intravenous Once  . mometasone-formoterol  2 puff Inhalation BID  . potassium chloride  20 mEq Oral BID  . scopolamine  1 patch Transdermal Q72H  . sennosides  10 mL Oral BID  . thiamine IV  100 mg Intravenous Daily  . vancomycin  750 mg Intravenous Q48H  . ziprasidone  10 mg Intramuscular QHS   Infusions:  . fat emulsion    . TPN (CLINIMIX) Adult without lytes 60 mL/hr at 12/13/14 1900    Assessment: 69 yo male with progressive mantle cell lymphoma. Patient being treated for electrolyte imbalance and AMS. Patient currently on vancomycin for Staph auricularis bacteremia.   Goal of Therapy:  Resolution of Symptoms  Plan:   1. TPN/Electrolytes: Patient now receiving Clinimix E at 61mL/hr, patient will be changed to Clinimix without electrolytes on 31mL/min. Will order additional potassium chloride 48mEq IV x 1. Will obtain follow-up potassium at 1400. Will follow-up all other electrolytes with am labs. Will need to replace electrolytes outside of TPN and will need to be mindful of patients  fluid status when replacing electrolytes.    2. Constipation: Patient currently with rectal tube and ilues. Patient removed NG tube and per MD will not be replaced. Will leave PO orders active incase Patient currently ordered lactulose 64mL BID by neuro. Will continue senna to 17.6mg  VT BID. Will discontinue erythromycin at this time.   3. Vancomycin (day 8): Trough 35. Patient experiencing renal failure and may require dialysis. Will hold vancomycin and obtain follow-up level with am labs. Patient to be on vancomycin through 7/9.    Pharmacy will continue to monitor and adjust per consult.    Currie Paris Clinical Pharmacist 12/14/2014    7/2 AM Random vancomycin level 26. Will change order to PRN and order new level in AM.   K+ 3.1, Cl- 112 and PO4 5.9. No K acetate in house to supplement K+ level.   Sim Boast, PharmD, BCPS  12/14/2014   7/3 AM vanc level 17. 750 mg x1 ordered. New level ordered for 7/4 AM.  K+ 3.1. 40 mEq Kcl ordered.  Sim Boast, PharmD, BCPS  12/15/2014

## 2014-12-14 NOTE — Progress Notes (Addendum)
Updated Dr. Darvin Neighbours about patient's hgb, creatine, BUN, 1200 NG output, 650 stool output and 250 urine output this morning- and no bowel sounds.  Sent stool for cdiff which was negative- pt receiving one unit of RBC at this time- will recheck when infusion is complete.  Decreased urine output- foley in place. ST in mid teens on monitor.  Vitals stable.  One time complaint of pain.

## 2014-12-14 NOTE — Progress Notes (Signed)
[name] at [facility]                                                                                                                                                                                            Patient Demographics   Caleb Francis, is a 69 y.o. male, DOB - 01-01-1946, IZT:[id number]  Admit date - 11/29/2014   Admitting Physician Aldean Jewett, MD  Outpatient Primary MD for the patient is No PCP Per Patient   Subjective:   No concerns from patient. Awake but drowzy. NG reinserted due to vomiting, distension. Hem positive gastric output.  Review of Systems:   ROS Poor historian. Confused  Vitals:   Filed Vitals:   12/14/14 0400 12/14/14 0500 12/14/14 0600 12/14/14 0700  BP: 98/74 114/78 91/56 81/64   Pulse: 118 116 112   Temp: 98 F (36.7 C)   98.1 F (36.7 C)  TempSrc: Axillary   Axillary  Resp: 24 23 21    Height:      Weight:  69.1 kg (152 lb 5.4 oz)    SpO2: 100% 93% 92%     Wt Readings from Last 3 Encounters:  12/14/14 69.1 kg (152 lb 5.4 oz)  11/27/14 68.2 kg (150 lb 5.7 oz)  11/25/14 70.761 kg (156 lb)     Intake/Output Summary (Last 24 hours) at 12/14/14 1147 Last data filed at 12/14/14 1115  Gross per 24 hour  Intake 4518.75 ml  Output    700 ml  Net 3818.75 ml    Physical Exam:   GENERAL: critically ill-appearing PERRLA Atraumatic  NECK: Supple. There is no jugular venous distention. No bruits, no lymphadenopathy, no thyromegaly.  HEART:  Tachycardic 2/6 systolic ejection murmur no rubs LUNGS: Bilateral rhonchi and wheezing.  ABDOMEN: Distended bowel hypoactive without rebound or guarding  EXTREMITIES: No evidence of any cyanosis, clubbing,   NEUROLOGIC: Lethargic  SKIN: Moist and warm with no rashes appreciated.  Psych:    no agitation reported overnight.  Radiology Reports 12/10/2014: CT abdomen: Large distended colonic ileus. No obstruction  12/12/2014: KUB   CBC  Recent  Labs Lab 12/10/14 0500 12/11/14 0537 12/12/14 0535 12/13/14 0448 12/14/14 0502  WBC 2.1* 3.6* 6.4 11.1* 10.0  HGB 8.9* 8.6* 7.9* 8.2* 7.3*  HCT 26.5* 25.8* 23.8* 25.1* 22.4*  PLT 13* 10* 16* 36* 36*  MCV 86.2 87.4 87.7 87.7 87.7  MCH 28.9 29.1 29.0 28.7 28.6  MCHC 33.6 33.3 33.1 32.7 32.6  RDW 17.7* 18.0* 18.2* 18.1* 18.2*  LYMPHSABS 0.9 0.4* 1.2 2.3 1.4  MONOABS 0.1 0.4 0.1 0.9 0.6  EOSABS 0.0 0.0  0.1 0.0 0.0  BASOSABS 0.0 0.0 0.0 0.0 0.0    Chemistries   Recent Labs Lab 12/10/14 0500  12/11/14 0537  12/12/14 0535 12/12/14 1516 12/13/14 0448 12/13/14 1324 12/14/14 0502  NA 149*  --  145  --  143  --  145  --  143  K 3.2*  < > 3.2*  < > 3.2* 3.2* 3.4* 3.6 3.1*  CL 120*  --  115*  --  112*  --  111  --  112*  CO2 24  --  24  --  24  --  23  --  24  GLUCOSE 132*  --  161*  --  177*  --  192*  --  158*  BUN 41*  --  49*  --  60*  --  64*  --  78*  CREATININE 1.92*  --  2.85*  --  3.73* 3.91* 4.26*  --  4.91*  CALCIUM 8.6*  --  7.8*  --  7.8*  --  8.7*  --  8.0*  MG 1.9  --  2.0  --  2.1  --  2.2  --  2.3  AST  --   --   --   --  13*  --   --   --  12*  ALT  --   --   --   --  9*  --   --   --  9*  ALKPHOS  --   --   --   --  112  --   --   --  114  BILITOT  --   --   --   --  0.7  --   --   --  0.6  < > = values in this interval not displayed. ------------------------------------------------------------------------------------------------------------------- No results for input(s): TSH, T4TOTAL, T3FREE, THYROIDAB in the last 72 hours.  Invalid input(s): FREET3 ------------------------------------------------------------------------------------------------------------------  Coagulation profile  Recent Labs Lab 12/07/14 2129  INR 1.28     Assessment & Plan  This is a 69 year old male with mantle cell lymphoma undergoing chemotherapy who was admitted June 15 from oncology clinic with altered mental status and chest pain.    # Sepsis: With Klebsiella UTI and  bacteremia and Staph bacteremia Patient's urine culture and blood cultures were positive for Klebsiella on admission. [place]. Repeat blood cx on 12/06/2014 are growing  STAPHYLOCOCCUS AURICULARIS . Vancomycin till 12/21/2014. He was temporarily placed on pressors on 6/24 with the transfusion reaction  but has now been off of this and BP stable.  # altered mental status - This is due to metabolic encephalopathy due to sepsis and urinary tract infection/ARF/Hypernatremia along with ICU delirium. - CT head/MRI brain showing no evidence of acute pathology.  - Mental status still waxing and waning  # chest pain on admission: No EKG changes to suggest ischemia.  Continue to monitor for any further cardiac symptoms  # Mantle cell lymphoma: Patient is on salvage chemotherapy. Patient follows with Dr. Mike Gip.   He is s/p chemo He was on GCSF and no longer neutropenic.    # Panctytopenia: Due to chemotherapy and Mantle cell lymphoma, patient is currently on Neupogen. Patient is s/p 2 units PRBC on 6/25 and has received PLT transfusion almost daily. Patient needs premedication prior to any transfusions including Benadryl and Tylenol.   # Acute renal failure due to ATN : Appreciate nephrology input.Patient's creatinine continues to worsen. As per nephrology if creatinine does not improve may  have to consider possible dialysis.  # Thrombocytopenia This is due to sepsis/lymphoma and chemo  # colonic Ileus with GI bleed: CT scan shows colonic ileus that has worsened. Patient is no longer neutropenic. Rectal tube in place. Repeat xray showed some improvement. NG tube had to be replaced.  # Hypernatremia unclear etiology: Resolved with appropriate fluids.[name] Appreciate nephrology consultation.  # GI bleed: Patient currently has an NG tube placed.  Restart PPI. Discussed with Dr. Gustavo Lah. High risk for re bleeding due to thrombocytopenia.  # Acute blood loss anmeia over pancytopenia Due to Gi  losses. Transfuse 1 unit today  Overall Prognosis is very poor   DNR  DVT Prophylaxis   SCD's   Lab Results  Component Value Date   PLT 36* 12/14/2014    Overall poor prognosis and unlikely to return to baseline.  Total critical care Time Spent in minutes - 35 min   Greater than 50% of time spent in care and coordination  Hillary Bow R M.D on 12/14/2014 at 11:47 AM  Between 7am to 6pm - Pager - [phone]  After 6pm go to [email or web address] - password [email or web address] [address] [facility]   Office  [phone]

## 2014-12-14 NOTE — Progress Notes (Signed)
Nutrition Follow-up  DOCUMENTATION CODES:  Severe malnutrition in context of acute illness/injury  INTERVENTION:   (Coordination of Care): discussed current TPN, electrolytes, renal function with MD Lateef; per MD, continue TPN without electrolytes (only formula available is 5%AA/15%Dextrose), ok with increasing to rate of 70 ml/hr at this time. MD aware that not meeting calorie needs with current formula and rate PN: 5%AA/15%Dextrose at 70 ml/hr provides 84 g of protein, 1680 mL of fluid, 1193 kcals. Additional calories from 20% lipids today (if provide 2x weekly will provide on average 285 additional kcals per day)  NUTRITION DIAGNOSIS:  Inadequate oral intake related to inability to eat as evidenced by NPO status. Being addressed via TPN  GOAL:  Patient will meet greater than or equal to 90% of their needs. Continues  MONITOR:   (PN, Energy Intake, Anthropometrics, Electrolyte/Renal Profile, Glucose Profile, Digestive System)  ASSESSMENT:  Pt with small bout of emesis throughout the day yesterday, NG replaced.   PN: 5%AA/15%Dextrose without electrolytes at 60 ml/hr, 20% lipids infusing today  Digestive System: NG with 1200 mL out in 24 hours, flexiseal with 850 mL liquid stool  Urine Volume: UOP 600 mL in 24 hours  Electrolyte/Renal and Glucose profiles:  Recent Labs Lab 12/12/14 0535 12/12/14 1516 12/13/14 0448 12/13/14 1324 12/14/14 0502  NA 143  --  145  --  143  K 3.2* 3.2* 3.4* 3.6 3.1*  CL 112*  --  111  --  112*  CO2 24  --  23  --  24  BUN 60*  --  64*  --  78*  CREATININE 3.73* 3.91* 4.26*  --  4.91*  CALCIUM 7.8*  --  8.7*  --  8.0*  MG 2.1  --  2.2  --  2.3  PHOS 5.0*  --  6.2*  --  5.9*  GLUCOSE 177*  --  192*  --  158*    Height:  Ht Readings from Last 1 Encounters:  11/29/14 6' (1.829 m)    Weight:  Wt Readings from Last 1 Encounters:  12/14/14 152 lb 5.4 oz (69.1 kg)   Filed Weights   12/12/14 0500 12/13/14 0500 12/14/14 0500   Weight: 148 lb 13 oz (67.5 kg) 143 lb 4.8 oz (65 kg) 152 lb 5.4 oz (69.1 kg)    Wt Readings from Last 10 Encounters:  12/14/14 152 lb 5.4 oz (69.1 kg)  11/27/14 150 lb 5.7 oz (68.2 kg)  11/25/14 156 lb (70.761 kg)  11/18/14 157 lb 11.2 oz (71.532 kg)  11/05/14 164 lb 0.4 oz (74.4 kg)  10/24/14 165 lb (74.844 kg)  10/24/14 169 lb 5 oz (76.8 kg)  10/22/14 168 lb (76.204 kg)    BMI:  Body mass index is 20.66 kg/(m^2).  Estimated Nutritional Needs:  Kcal:  1994-2357kcals, BEE: 1511kcals, TEE: IF 1.1-1.3)(AF 1.2)  Protein:  74-88g protein (1.0-1.2g/kg)  Fluid:  1838-223mL of fluid (25-11mL/kg)  Skin:   reviewed  Diet Order:  Diet NPO time specified TPN (CLINIMIX) Adult without lytes TPN (CLINIMIX) Adult without lytes  EDUCATION NEEDS:  No education needs identified at this time   La Ward, RD, LDN [phone] Pager

## 2014-12-14 NOTE — Progress Notes (Signed)
Blood transfusion completed.

## 2014-12-14 NOTE — Progress Notes (Signed)
Hypotensive.  Spoke with Dr. Lavetta Nielsen.  NS bolus ordered.

## 2014-12-14 NOTE — Consult Note (Signed)
Subjective: Patient seen for colonic ileus. patient will answer questions appropriately, though is sleepy. He denies abdominal pain. He denies nausea. An NG tube is in place with a pale greenish material or evidence of blood.  Objective: Vital signs in last 24 hours: Temp:  [97.5 F (36.4 C)-98.4 F (36.9 C)] 98.2 F (36.8 C) (07/02 1725) Pulse Rate:  [46-125] 108 (07/02 1800) Resp:  [13-29] 16 (07/02 1800) BP: (79-123)/(50-83) 107/62 mmHg (07/02 1800) SpO2:  [92 %-100 %] 97 % (07/02 1700) Weight:  [69.1 kg (152 lb 5.4 oz)] 69.1 kg (152 lb 5.4 oz) (07/02 0500) Blood pressure 107/62, pulse 108, temperature 98.2 F (36.8 C), temperature source Axillary, resp. rate 16, height 6' (1.829 m), weight 69.1 kg (152 lb 5.4 oz), SpO2 97 %.   Intake/Output from previous day: 07/01 0701 - 07/02 0700 In: 4478.8 [NG/GT:1200; IV Piggyback:1110; TPN:1518.8] Out: 700 [Urine:350; Emesis/NG output:150; Stool:200]  Intake/Output this shift: Total I/O In: 2404.5 [I.V.:10; Blood:120; Other:375; NG/GT:500; IV Piggyback:400; TPN:999.5] Out: 200 [Urine:200]   General appearance:  Ill-appearing 69 year old male. Resp:  Clear to auscultation Cardio:  Regular rate and rhythm GI:  Mild to moderate distention bowel sounds are negative there is a mild tympany. He is nontender to palpation. Extremities:     Lab Results: Results for orders placed or performed during the hospital encounter of 11/29/14 (from the past 24 hour(s))  Glucose, capillary     Status: Abnormal   Collection Time: 12/13/14  7:51 PM  Result Value Ref Range   Glucose-Capillary 134 (H) 65 - 99 mg/dL  Glucose, capillary     Status: Abnormal   Collection Time: 12/14/14 12:19 AM  Result Value Ref Range   Glucose-Capillary 142 (H) 65 - 99 mg/dL  CBC with Differential     Status: Abnormal   Collection Time: 12/14/14  5:02 AM  Result Value Ref Range   WBC 10.0 3.8 - 10.6 K/uL   RBC 2.55 (L) 4.40 - 5.90 MIL/uL   Hemoglobin 7.3 (L) 13.0 -  18.0 g/dL   HCT 22.4 (L) 40.0 - 52.0 %   MCV 87.7 80.0 - 100.0 fL   MCH 28.6 26.0 - 34.0 pg   MCHC 32.6 32.0 - 36.0 g/dL   RDW 18.2 (H) 11.5 - 14.5 %   Platelets 36 (L) 150 - 440 K/uL   Neutrophils Relative % 75 %   Lymphocytes Relative 14 %   Monocytes Relative 6 %   Eosinophils Relative 0 %   Basophils Relative 0 %   Band Neutrophils 5 %   Neutro Abs 8.0 (H) 1.4 - 6.5 K/uL   Lymphs Abs 1.4 1.0 - 3.6 K/uL   Monocytes Absolute 0.6 0.2 - 1.0 K/uL   Eosinophils Absolute 0.0 0 - 0.7 K/uL   Basophils Absolute 0.0 0 - 0.1 K/uL  Vancomycin, random     Status: None   Collection Time: 12/14/14  5:02 AM  Result Value Ref Range   Vancomycin Rm 26 ug/mL  Magnesium     Status: None   Collection Time: 12/14/14  5:02 AM  Result Value Ref Range   Magnesium 2.3 1.7 - 2.4 mg/dL  Phosphorus     Status: Abnormal   Collection Time: 12/14/14  5:02 AM  Result Value Ref Range   Phosphorus 5.9 (H) 2.5 - 4.6 mg/dL  Comprehensive metabolic panel     Status: Abnormal   Collection Time: 12/14/14  5:02 AM  Result Value Ref Range   Sodium 143 135 - 145 mmol/L  Potassium 3.1 (L) 3.5 - 5.1 mmol/L   Chloride 112 (H) 101 - 111 mmol/L   CO2 24 22 - 32 mmol/L   Glucose, Bld 158 (H) 65 - 99 mg/dL   BUN 78 (H) 6 - 20 mg/dL   Creatinine, Ser 4.91 (H) 0.61 - 1.24 mg/dL   Calcium 8.0 (L) 8.9 - 10.3 mg/dL   Total Protein 5.3 (L) 6.5 - 8.1 g/dL   Albumin 2.1 (L) 3.5 - 5.0 g/dL   AST 12 (L) 15 - 41 U/L   ALT 9 (L) 17 - 63 U/L   Alkaline Phosphatase 114 38 - 126 U/L   Total Bilirubin 0.6 0.3 - 1.2 mg/dL   GFR calc non Af Amer 11 (L) >60 mL/min   GFR calc Af Amer 13 (L) >60 mL/min   Anion gap 7 5 - 15  Glucose, capillary     Status: Abnormal   Collection Time: 12/14/14  5:26 AM  Result Value Ref Range   Glucose-Capillary 145 (H) 65 - 99 mg/dL  C difficile quick scan w PCR reflex ([facility] only)     Status: None   Collection Time: 12/14/14 10:11 AM  Result Value Ref Range   C Diff antigen NEGATIVE    C Diff  toxin NEGATIVE    C Diff interpretation Negative for C. difficile   Glucose, capillary     Status: Abnormal   Collection Time: 12/14/14 10:56 AM  Result Value Ref Range   Glucose-Capillary 153 (H) 65 - 99 mg/dL  Type and screen     Status: None (Preliminary result)   Collection Time: 12/14/14  1:23 PM  Result Value Ref Range   ABO/RH(D) O POS    Antibody Screen NEG    Sample Expiration 12/17/2014    Unit Number [id number]    Blood Component Type RBC CPDA1,LR IRR    Unit division 00    Status of Unit ISSUED    Transfusion Status OK TO TRANSFUSE    Crossmatch Result Compatible   Prepare RBC     Status: None   Collection Time: 12/14/14  1:23 PM  Result Value Ref Range   Order Confirmation ORDER PROCESSED BY BLOOD BANK   Basic metabolic panel     Status: Abnormal   Collection Time: 12/14/14  4:20 PM  Result Value Ref Range   Sodium SEE COMMENTS 135 - 145 mmol/L   Potassium 3.5 3.5 - 5.1 mmol/L   Chloride 110 101 - 111 mmol/L   CO2 22 22 - 32 mmol/L   Glucose, Bld 182 (H) 65 - 99 mg/dL   BUN 83 (H) 6 - 20 mg/dL   Creatinine, Ser 5.34 (H) 0.61 - 1.24 mg/dL   Calcium 8.0 (L) 8.9 - 10.3 mg/dL   GFR calc non Af Amer 10 (L) >60 mL/min   GFR calc Af Amer 12 (L) >60 mL/min   Anion gap SEE COMMENTS 5 - 15  Glucose, capillary     Status: Abnormal   Collection Time: 12/14/14  6:00 PM  Result Value Ref Range   Glucose-Capillary 166 (H) 65 - 99 mg/dL      Recent Labs  12/12/14 0535 12/13/14 0448 12/14/14 0502  WBC 6.4 11.1* 10.0  HGB 7.9* 8.2* 7.3*  HCT 23.8* 25.1* 22.4*  PLT 16* 36* 36*   BMET  Recent Labs  12/13/14 0448 12/13/14 1324 12/14/14 0502 12/14/14 1620  NA 145  --  143 SEE COMMENTS  K 3.4* 3.6 3.1* 3.5  CL 111  --  112* 110  CO2 23  --  24 22  GLUCOSE 192*  --  158* 182*  BUN 64*  --  78* 83*  CREATININE 4.26*  --  4.91* 5.34*  CALCIUM 8.7*  --  8.0* 8.0*   LFT  Recent Labs  12/14/14 0502  PROT 5.3*  ALBUMIN 2.1*  AST 12*  ALT 9*  ALKPHOS  114  BILITOT 0.6   PT/INR No results for input(s): LABPROT, INR in the last 72 hours. Hepatitis Panel No results for input(s): HEPBSAG, HCVAB, HEPAIGM, HEPBIGM in the last 72 hours. C-Diff  Recent Labs  12/14/14 1011  CDIFFTOX NEGATIVE   No results for input(s): CDIFFPCR in the last 72 hours.   Studies/Results: Dg Abd 1 View  12/14/2014   CLINICAL DATA:  Ileus  EXAM: ABDOMEN - 1 VIEW  COMPARISON:  12/13/2014  FINDINGS: NG tube tip is in the distal stomach. Left ureteral stent remains in place, unchanged. Small left lower pole renal stone noted.  Mild gaseous distention of bowel loops in the lower at the, likely distal small bowel loops and proximal colon. Decreasing distention of the transverse colon. Otherwise no real change. No free air organomegaly.  IMPRESSION: NG tube tip in the distal stomach.  Slight decreased distention of the colon with stable mild gaseous distention of bowel loops in the lower abdomen and upper pelvis.   Electronically Signed   By: Rolm Baptise M.D.   On: 12/14/2014 09:43   Dg Abd Portable 1v  12/13/2014   CLINICAL DATA:  Ileus.  EXAM: PORTABLE ABDOMEN - 1 VIEW  COMPARISON:  December 12, 2014.  FINDINGS: Left-sided ureteral stent is unchanged in position. There is significant improvement involving the gaseous distention of the colon. No significant small bowel dilatation is noted. Mild degenerative changes seen in the lower lumbar spine.  IMPRESSION: Significantly improved colonic distention compared to prior exam. Left-sided ureteral stent is unchanged in position.   Electronically Signed   By: Marijo Conception, M.D.   On: 12/13/2014 10:30    Scheduled Inpatient Medications:   . antiseptic oral rinse  7 mL Mouth Rinse BID  . bisacodyl  10 mg Rectal Daily  . insulin aspart  1-3 Units Subcutaneous Q6H  . lactulose  30 g Oral BID  . methylPREDNISolone (SOLU-MEDROL) injection  60 mg Intravenous Once  . mometasone-formoterol  2 puff Inhalation BID  . pantoprazole  (PROTONIX) IV  40 mg Intravenous Q12H  . potassium chloride  20 mEq Oral BID  . scopolamine  1 patch Transdermal Q72H  . sennosides  10 mL Oral BID  . thiamine IV  100 mg Intravenous Daily  . ziprasidone  10 mg Intramuscular QHS    Continuous Inpatient Infusions:   . fat emulsion 500 mL (12/14/14 1250)  . TPN (CLINIMIX) Adult without lytes 70 mL/hr at 12/14/14 1817    PRN Inpatient Medications:  acetaminophen **OR** acetaminophen, albuterol, bethanechol, haloperidol lactate, heparin lock flush, heparin lock flush, magnesium hydroxide, metoprolol, morphine injection, ondansetron (ZOFRAN) IV, promethazine, sodium chloride, sodium chloride, sodium chloride, vancomycin, ziprasidone  Miscellaneous:   Assessment:  1. Colonic pseudoobstruction/ileus. Mild improvement over the last 3 days per serial films. Rectal tube and NG tube in place. 2. GI bleed in the setting of critically ill/icu patient with thrombocytopenia 3. Multiple medical issues including sepsis, recent salvage chemotherapy for mantle cell lymphoma, thrombocytopenia,pancytopenia, ATN, metabolic encephalopathy  Plan:  1. Continue serial abdominal film and decompression with NG and rectal tube. He is not a  candidate for sedated colonoscopic decompression.  Following.  Lollie Sails MD 12/14/2014, 6:27 PM

## 2014-12-15 ENCOUNTER — Inpatient Hospital Stay: Payer: Medicare Other

## 2014-12-15 LAB — CBC WITH DIFFERENTIAL/PLATELET
BASOS PCT: 0 % (ref 0–1)
Band Neutrophils: 1 % (ref 0–10)
Basophils Absolute: 0 10*3/uL (ref 0.0–0.1)
Blasts: 0 %
Eosinophils Absolute: 0 10*3/uL (ref 0.0–0.7)
Eosinophils Relative: 0 % (ref 0–5)
HCT: 22.6 % — ABNORMAL LOW (ref 40.0–52.0)
Hemoglobin: 7.5 g/dL — ABNORMAL LOW (ref 13.0–18.0)
LYMPHS PCT: 8 % — AB (ref 12–46)
Lymphs Abs: 0.8 10*3/uL (ref 0.7–4.0)
MCH: 27.9 pg (ref 26.0–34.0)
MCHC: 33 g/dL (ref 32.0–36.0)
MCV: 84.7 fL (ref 80.0–100.0)
MYELOCYTES: 0 %
Metamyelocytes Relative: 0 %
Monocytes Absolute: 0.2 10*3/uL (ref 0.1–1.0)
Monocytes Relative: 2 % — ABNORMAL LOW (ref 3–12)
Neutro Abs: 8.4 10*3/uL — ABNORMAL HIGH (ref 1.7–7.7)
Neutrophils Relative %: 89 % — ABNORMAL HIGH (ref 43–77)
OTHER: 0 %
PLATELETS: 43 10*3/uL — AB (ref 150–440)
Promyelocytes Absolute: 0 %
RBC: 2.67 MIL/uL — ABNORMAL LOW (ref 4.40–5.90)
RDW: 20.3 % — AB (ref 11.5–14.5)
WBC: 9.4 10*3/uL (ref 3.8–10.6)
nRBC: 0 /100 WBC

## 2014-12-15 LAB — TYPE AND SCREEN
ABO/RH(D): O POS
Antibody Screen: NEGATIVE
Unit division: 0

## 2014-12-15 LAB — BASIC METABOLIC PANEL
Anion gap: 11 (ref 5–15)
Anion gap: 8 (ref 5–15)
BUN: 92 mg/dL — ABNORMAL HIGH (ref 6–20)
BUN: 99 mg/dL — AB (ref 6–20)
CALCIUM: 8.2 mg/dL — AB (ref 8.9–10.3)
CO2: 20 mmol/L — ABNORMAL LOW (ref 22–32)
CO2: 20 mmol/L — ABNORMAL LOW (ref 22–32)
CREATININE: 5.74 mg/dL — AB (ref 0.61–1.24)
Calcium: 8 mg/dL — ABNORMAL LOW (ref 8.9–10.3)
Chloride: 112 mmol/L — ABNORMAL HIGH (ref 101–111)
Chloride: 114 mmol/L — ABNORMAL HIGH (ref 101–111)
Creatinine, Ser: 5.5 mg/dL — ABNORMAL HIGH (ref 0.61–1.24)
GFR calc Af Amer: 11 mL/min — ABNORMAL LOW (ref >60)
GFR calc Af Amer: 11 mL/min — ABNORMAL LOW (ref >60)
GFR calc non Af Amer: 10 mL/min — ABNORMAL LOW (ref >60)
GFR calc non Af Amer: 9 mL/min — ABNORMAL LOW (ref >60)
GLUCOSE: 148 mg/dL — AB (ref 65–99)
Glucose, Bld: 138 mg/dL — ABNORMAL HIGH (ref 65–99)
POTASSIUM: 3.1 mmol/L — AB (ref 3.5–5.1)
Potassium: 3.3 mmol/L — ABNORMAL LOW (ref 3.5–5.1)
Sodium: 143 mmol/L (ref 135–145)
Sodium: 142 mmol/L (ref 135–145)

## 2014-12-15 LAB — GLUCOSE, CAPILLARY
GLUCOSE-CAPILLARY: 137 mg/dL — AB (ref 65–99)
GLUCOSE-CAPILLARY: 119 mg/dL — AB (ref 65–99)
Glucose-Capillary: 131 mg/dL — ABNORMAL HIGH (ref 65–99)
Glucose-Capillary: 139 mg/dL — ABNORMAL HIGH (ref 65–99)
Glucose-Capillary: 150 mg/dL — ABNORMAL HIGH (ref 65–99)
Glucose-Capillary: 158 mg/dL — ABNORMAL HIGH (ref 65–99)

## 2014-12-15 LAB — VANCOMYCIN, RANDOM: VANCOMYCIN RM: 17 ug/mL

## 2014-12-15 MED ORDER — VANCOMYCIN HCL IN DEXTROSE 750-5 MG/150ML-% IV SOLN
750.0000 mg | Freq: Once | INTRAVENOUS | Status: AC
  Administered 2014-12-15: 750 mg via INTRAVENOUS
  Filled 2014-12-15: qty 150

## 2014-12-15 MED ORDER — TRACE MINERALS CR-CU-MN-SE-ZN 10-1000-500-60 MCG/ML IV SOLN
INTRAVENOUS | Status: AC
  Administered 2014-12-15: 19:00:00 via INTRAVENOUS
  Filled 2014-12-15: qty 1680

## 2014-12-15 MED ORDER — POTASSIUM CHLORIDE 10 MEQ/100ML IV SOLN
10.0000 meq | INTRAVENOUS | Status: AC
  Administered 2014-12-15 (×4): 10 meq via INTRAVENOUS
  Filled 2014-12-15 (×4): qty 100

## 2014-12-15 MED ORDER — POTASSIUM CHLORIDE 10 MEQ/100ML IV SOLN
10.0000 meq | INTRAVENOUS | Status: AC
  Administered 2014-12-15 (×2): 10 meq via INTRAVENOUS
  Filled 2014-12-15 (×2): qty 100

## 2014-12-15 NOTE — Consult Note (Signed)
Subjective: Patient seen for colonic pseudoobstruction.  abd films today showing increase of small bowel distension, however the colonic distension may be a little better.  Patient denies abdominal pain or nausea.  Effluent in the rectal tube bag is dark green.   Objective: Vital signs in last 24 hours: Temp:  [97.7 F (36.5 C)-98.5 F (36.9 C)] 97.7 F (36.5 C) (07/03 1100) Pulse Rate:  [46-117] 106 (07/03 1100) Resp:  [8-22] 22 (07/03 1200) BP: (85-126)/(54-83) 101/63 mmHg (07/03 1200) SpO2:  [96 %-100 %] 98 % (07/03 0900) Weight:  [68.9 kg (151 lb 14.4 oz)] 68.9 kg (151 lb 14.4 oz) (07/03 0500) Blood pressure 101/63, pulse 106, temperature 97.7 F (36.5 C), temperature source Axillary, resp. rate 22, height 6' (1.829 m), weight 68.9 kg (151 lb 14.4 oz), SpO2 98 %.   Intake/Output from previous day: 07/02 0701 - 07/03 0700 In: 3944.5 [I.V.:10; Blood:400; NG/GT:500; IV Piggyback:550; TPN:2109.5] Out: 2000 [Urine:900; Emesis/NG output:100; Stool:1000]  Intake/Output this shift: Total I/O In: 680 [IV Piggyback:400; TPN:280] Out: 550 [Urine:550]   General appearance:  Elderly appearing, cachectic/ Resp:  cta Cardio:  rrr GI:  Mild distension, positive tympany, minimal bowel sounds. Nontender,  Extremities:     Lab Results: Results for orders placed or performed during the hospital encounter of 11/29/14 (from the past 24 hour(s))  Basic metabolic panel     Status: Abnormal   Collection Time: 12/14/14  4:20 PM  Result Value Ref Range   Sodium SEE COMMENTS 135 - 145 mmol/L   Potassium 3.5 3.5 - 5.1 mmol/L   Chloride 110 101 - 111 mmol/L   CO2 22 22 - 32 mmol/L   Glucose, Bld 182 (H) 65 - 99 mg/dL   BUN 83 (H) 6 - 20 mg/dL   Creatinine, Ser 5.34 (H) 0.61 - 1.24 mg/dL   Calcium 8.0 (L) 8.9 - 10.3 mg/dL   GFR calc non Af Amer 10 (L) >60 mL/min   GFR calc Af Amer 12 (L) >60 mL/min   Anion gap SEE COMMENTS 5 - 15  Glucose, capillary     Status: Abnormal   Collection Time:  12/14/14  6:00 PM  Result Value Ref Range   Glucose-Capillary 166 (H) 65 - 99 mg/dL  Hemoglobin and hematocrit, blood     Status: Abnormal   Collection Time: 12/14/14  7:55 PM  Result Value Ref Range   Hemoglobin 8.0 (L) 13.0 - 18.0 g/dL   HCT 23.4 (L) 40.0 - 52.0 %  Glucose, capillary     Status: Abnormal   Collection Time: 12/14/14 11:36 PM  Result Value Ref Range   Glucose-Capillary 151 (H) 65 - 99 mg/dL  CBC with Differential     Status: Abnormal   Collection Time: 12/15/14  5:29 AM  Result Value Ref Range   WBC 9.4 3.8 - 10.6 K/uL   RBC 2.67 (L) 4.40 - 5.90 MIL/uL   Hemoglobin 7.5 (L) 13.0 - 18.0 g/dL   HCT 22.6 (L) 40.0 - 52.0 %   MCV 84.7 80.0 - 100.0 fL   MCH 27.9 26.0 - 34.0 pg   MCHC 33.0 32.0 - 36.0 g/dL   RDW 20.3 (H) 11.5 - 14.5 %   Platelets 43 (L) 150 - 440 K/uL   Neutrophils Relative % 89 (H) 43 - 77 %   Lymphocytes Relative 8 (L) 12 - 46 %   Monocytes Relative 2 (L) 3 - 12 %   Eosinophils Relative 0 0 - 5 %   Basophils Relative  0 0 - 1 %   Band Neutrophils 1 0 - 10 %   Metamyelocytes Relative 0 %   Myelocytes 0 %   Promyelocytes Absolute 0 %   Blasts 0 %   nRBC 0 0 /100 WBC   Other 0 %   Neutro Abs 8.4 (H) 1.7 - 7.7 K/uL   Lymphs Abs 0.8 0.7 - 4.0 K/uL   Monocytes Absolute 0.2 0.1 - 1.0 K/uL   Eosinophils Absolute 0.0 0.0 - 0.7 K/uL   Basophils Absolute 0.0 0.0 - 0.1 K/uL  Vancomycin, random     Status: None   Collection Time: 12/15/14  5:29 AM  Result Value Ref Range   Vancomycin Rm 17 ug/mL  Basic metabolic panel     Status: Abnormal   Collection Time: 12/15/14  5:29 AM  Result Value Ref Range   Sodium 143 135 - 145 mmol/L   Potassium 3.1 (L) 3.5 - 5.1 mmol/L   Chloride 112 (H) 101 - 111 mmol/L   CO2 20 (L) 22 - 32 mmol/L   Glucose, Bld 148 (H) 65 - 99 mg/dL   BUN 92 (H) 6 - 20 mg/dL   Creatinine, Ser 5.50 (H) 0.61 - 1.24 mg/dL   Calcium 8.0 (L) 8.9 - 10.3 mg/dL   GFR calc non Af Amer 10 (L) >60 mL/min   GFR calc Af Amer 11 (L) >60 mL/min    Anion gap 11 5 - 15  Glucose, capillary     Status: Abnormal   Collection Time: 12/15/14  5:38 AM  Result Value Ref Range   Glucose-Capillary 137 (H) 65 - 99 mg/dL  Glucose, capillary     Status: Abnormal   Collection Time: 12/15/14  7:07 AM  Result Value Ref Range   Glucose-Capillary 150 (H) 65 - 99 mg/dL  Glucose, capillary     Status: Abnormal   Collection Time: 12/15/14 11:17 AM  Result Value Ref Range   Glucose-Capillary 139 (H) 65 - 99 mg/dL      Recent Labs  12/13/14 0448 12/14/14 0502 12/14/14 1955 12/15/14 0529  WBC 11.1* 10.0  --  9.4  HGB 8.2* 7.3* 8.0* 7.5*  HCT 25.1* 22.4* 23.4* 22.6*  PLT 36* 36*  --  43*   BMET  Recent Labs  12/14/14 0502 12/14/14 1620 12/15/14 0529  NA 143 SEE COMMENTS 143  K 3.1* 3.5 3.1*  CL 112* 110 112*  CO2 24 22 20*  GLUCOSE 158* 182* 148*  BUN 78* 83* 92*  CREATININE 4.91* 5.34* 5.50*  CALCIUM 8.0* 8.0* 8.0*   LFT  Recent Labs  12/14/14 0502  PROT 5.3*  ALBUMIN 2.1*  AST 12*  ALT 9*  ALKPHOS 114  BILITOT 0.6   PT/INR No results for input(s): LABPROT, INR in the last 72 hours. Hepatitis Panel No results for input(s): HEPBSAG, HCVAB, HEPAIGM, HEPBIGM in the last 72 hours. C-Diff  Recent Labs  12/14/14 1011  CDIFFTOX NEGATIVE   No results for input(s): CDIFFPCR in the last 72 hours.   Studies/Results: Dg Abd 1 View  12/14/2014   CLINICAL DATA:  Ileus  EXAM: ABDOMEN - 1 VIEW  COMPARISON:  12/13/2014  FINDINGS: NG tube tip is in the distal stomach. Left ureteral stent remains in place, unchanged. Small left lower pole renal stone noted.  Mild gaseous distention of bowel loops in the lower at the, likely distal small bowel loops and proximal colon. Decreasing distention of the transverse colon. Otherwise no real change. No free air organomegaly.  IMPRESSION: NG tube tip in the distal stomach.  Slight decreased distention of the colon with stable mild gaseous distention of bowel loops in the lower abdomen and upper  pelvis.   Electronically Signed   By: Rolm Baptise M.D.   On: 12/14/2014 09:43   Dg Abd 2 Views  12/15/2014   CLINICAL DATA:  Pseudo obstruction of the colon  EXAM: ABDOMEN - 2 VIEW  COMPARISON:  12/14/2014  FINDINGS: NG tube is in the stomach. There is a left ureteral stent remaining in place. Increasing dilatation of small bowel loops throughout the abdomen and upper pelvis. Continued mild gaseous distention of the cecum. No free air organomegaly.  IMPRESSION: Increasing gaseous distention of small bowel loops, now moderately dilated.   Electronically Signed   By: Rolm Baptise M.D.   On: 12/15/2014 10:39    Scheduled Inpatient Medications:   . antiseptic oral rinse  7 mL Mouth Rinse BID  . bisacodyl  10 mg Rectal Daily  . insulin aspart  1-3 Units Subcutaneous Q6H  . lactulose  30 g Oral BID  . methylPREDNISolone (SOLU-MEDROL) injection  60 mg Intravenous Once  . mometasone-formoterol  2 puff Inhalation BID  . pantoprazole (PROTONIX) IV  40 mg Intravenous Q12H  . potassium chloride  20 mEq Oral BID  . scopolamine  1 patch Transdermal Q72H  . sennosides  10 mL Oral BID  . thiamine IV  100 mg Intravenous Daily  . ziprasidone  10 mg Intramuscular QHS    Continuous Inpatient Infusions:   . TPN (CLINIMIX) Adult without lytes 70 mL/hr at 12/14/14 1900  . TPN (CLINIMIX) Adult without lytes      PRN Inpatient Medications:  acetaminophen **OR** acetaminophen, albuterol, bethanechol, haloperidol lactate, heparin lock flush, heparin lock flush, magnesium hydroxide, metoprolol, morphine injection, ondansetron (ZOFRAN) IV, promethazine, sodium chloride, sodium chloride, sodium chloride, vancomycin, ziprasidone  Miscellaneous:   Assessment:  1) colonic pseudoobstruction/ileus, multiple electrolyte abnormalities. 2)Multiple medical issues including sepsis, recent salvage chemotherapy for mantle cell lymphoma, thrombocytopenia,pancytopenia, ATN, metabolic encephalopathy   Plan:  1) continue  rectal tube and ngt decompression.  He is not a candidate for colonoscopic decompression .   2) on ppi for recent black heme positive stool.  No new GI recs.   Lollie Sails MD 12/15/2014, 1:35 PM

## 2014-12-15 NOTE — Progress Notes (Signed)
Patient transferred from CCU today. No complaints of pain. Patient has rested comfortably, continuously asking for something to drink. NG tube is to intermittent suction, per CCU RN. Rectal tube still in place with significant output. Foley also in place. TPN bag changed. After 4 runs of potassium given by CCU RN, potassium was rechecked and came up from 3.1 to 3.3. Two more runs of K ordered, first one started. Will continue to monitor.

## 2014-12-15 NOTE — Progress Notes (Signed)
[place] at [facility]                                                                                                                                                                                            Patient Demographics   Caleb Francis, is a 69 y.o. male, DOB - [birth year], GGY:[id number]  Admit date - 11/29/2014   Admitting Physician Aldean Jewett, MD  Outpatient Primary MD for the patient is No PCP Per Patient   Subjective:   No concerns from patient. Awake but drowzy. NG reinserted due to vomiting, distension. Poor UOP  Review of Systems:   ROS drowzy  Vitals:   Filed Vitals:   12/15/14 0400 12/15/14 0500 12/15/14 0600 12/15/14 0700  BP: 123/70 108/69 108/71   Pulse: 109 93 110   Temp: 98.3 F (36.8 C)   98.4 F (36.9 C)  TempSrc: Oral   Oral  Resp: 21 20 8    Height:      Weight:  68.9 kg (151 lb 14.4 oz)    SpO2: 98% 100% 97%     Wt Readings from Last 3 Encounters:  12/15/14 68.9 kg (151 lb 14.4 oz)  11/27/14 68.2 kg (150 lb 5.7 oz)  11/25/14 70.761 kg (156 lb)     Intake/Output Summary (Last 24 hours) at 12/15/14 0911 Last data filed at 12/15/14 0350  Gross per 24 hour  Intake 3874.5 ml  Output   2000 ml  Net 1874.5 ml    Physical Exam:   GENERAL: critically ill-appearing PERRLA Atraumatic  NECK: Supple. There is no jugular venous distention. No bruits, no lymphadenopathy, no thyromegaly.  HEART:  Tachycardic 2/6 systolic ejection murmur no rubs LUNGS: Bilateral rhonchi and wheezing.  ABDOMEN: Distended bowel hypoactive without rebound or guarding  EXTREMITIES: No evidence of any cyanosis, clubbing,   NEUROLOGIC: Lethargic  SKIN: Moist and warm with no rashes appreciated.  Psych:    no agitation reported overnight.  Radiology Reports 12/10/2014: CT abdomen: Large distended colonic ileus. No obstruction  12/12/2014: KUB   CBC  Recent Labs Lab 12/11/14 0537 12/12/14 0535 12/13/14 0448  12/14/14 0502 12/14/14 1955 12/15/14 0529  WBC 3.6* 6.4 11.1* 10.0  --  9.4  HGB 8.6* 7.9* 8.2* 7.3* 8.0* 7.5*  HCT 25.8* 23.8* 25.1* 22.4* 23.4* 22.6*  PLT 10* 16* 36* 36*  --  43*  MCV 87.4 87.7 87.7 87.7  --  84.7  MCH 29.1 29.0 28.7 28.6  --  27.9  MCHC 33.3 33.1 32.7 32.6  --  33.0  RDW 18.0* 18.2* 18.1* 18.2*  --  20.3*  LYMPHSABS 0.4* 1.2 2.3 1.4  --  0.8  MONOABS 0.4 0.1 0.9 0.6  --  0.2  EOSABS 0.0 0.1 0.0 0.0  --  0.0  BASOSABS 0.0 0.0 0.0 0.0  --  0.0    Chemistries   Recent Labs Lab 12/10/14 0500  12/11/14 0537  12/12/14 0535 12/12/14 1516 12/13/14 0448 12/13/14 1324 12/14/14 0502 12/14/14 1620 12/15/14 0529  NA 149*  --  145  --  143  --  145  --  143 SEE COMMENTS 143  K 3.2*  < > 3.2*  < > 3.2* 3.2* 3.4* 3.6 3.1* 3.5 3.1*  CL 120*  --  115*  --  112*  --  111  --  112* 110 112*  CO2 24  --  24  --  24  --  23  --  24 22 20*  GLUCOSE 132*  --  161*  --  177*  --  192*  --  158* 182* 148*  BUN 41*  --  49*  --  60*  --  64*  --  78* 83* 92*  CREATININE 1.92*  --  2.85*  --  3.73* 3.91* 4.26*  --  4.91* 5.34* 5.50*  CALCIUM 8.6*  --  7.8*  --  7.8*  --  8.7*  --  8.0* 8.0* 8.0*  MG 1.9  --  2.0  --  2.1  --  2.2  --  2.3  --   --   AST  --   --   --   --  13*  --   --   --  12*  --   --   ALT  --   --   --   --  9*  --   --   --  9*  --   --   ALKPHOS  --   --   --   --  112  --   --   --  114  --   --   BILITOT  --   --   --   --  0.7  --   --   --  0.6  --   --   < > = values in this interval not displayed. ------------------------------------------------------------------------------------------------------------------- No results for input(s): TSH, T4TOTAL, T3FREE, THYROIDAB in the last 72 hours.  Invalid input(s): FREET3 ------------------------------------------------------------------------------------------------------------------  Coagulation profile No results for input(s): INR, PROTIME in the last 168 hours.   Assessment & Plan  This is a  69 year old male with mantle cell lymphoma undergoing chemotherapy who was admitted June 15 from oncology clinic with altered mental status and chest pain.   # Sepsis: With Klebsiella UTI and bacteremia and Staph bacteremia Patient's urine culture and blood cultures were positive for Klebsiella on admission. [place]. Repeat blood cx on 12/06/2014 are growing  STAPHYLOCOCCUS AURICULARIS . Vancomycin till 12/21/2014. He was temporarily placed on pressors on 6/24 with the transfusion reaction  but has now been off of this and BP stable.  # altered mental status - This is due to metabolic encephalopathy due to sepsis and urinary tract infection/ARF/Hypernatremia along with ICU delirium. - CT head/MRI brain showing no evidence of acute pathology.  - Mental status still waxing and waning  # chest pain on admission: No EKG changes to suggest ischemia.  Continue to monitor for any further cardiac symptoms  # Mantle cell lymphoma: Patient is on salvage chemotherapy. Patient follows with Dr. Mike Gip.   He is  s/p chemo He was on GCSF and no longer neutropenic.    # Panctytopenia: Due to chemotherapy and Mantle cell lymphoma, patient is currently on Neupogen. Patient is s/p 2 units PRBC on 6/25 and 1 unit 12/14/2014 and has received PLT transfusion almost daily. Patient needs premedication prior to any transfusions including Benadryl and Tylenol.   # Acute renal failure due to ATN : Appreciate nephrology input. Patient's creatinine continues to worsen. Patient is poor candidate for dialysis.  # Thrombocytopenia This is due to sepsis/lymphoma and chemo  # colonic Ileus with GI bleed: CT scan shows colonic ileus that has worsened. Patient is no longer neutropenic. Rectal tube in place. Repeat xray showed some improvement. Continue NG tube  # Hypernatremia - resolved  # GI bleed: Patient currently has an NG tube placed.  Restart PPI. Discussed with Dr. Gustavo Lah. High risk for re bleeding due  to thrombocytopenia.  # Acute blood loss anmeia over pancytopenia Due to GI losses and lymphoma.   Overall Prognosis is very poor   DNR  DVT Prophylaxis   SCD's    Lab Results  Component Value Date   PLT 43* 12/15/2014     Poor prognosis. Poor understanding from patient. Discussed with son and he is hoping patient will recover from this illness.  Total time spent 40 minutes  Greater than 50% of time spent in care and coordination  Hillary Bow R M.D on 12/15/2014 at 9:11 AM  Between 7am to 6pm - Pager - [phone]  After 6pm go to [email or web address] - password [email or web address] [facility]   Office  [phone]

## 2014-12-15 NOTE — Progress Notes (Signed)
Subjective:  UOP did pick up the past 24 hours, up to 900cc. Cr still rising.   Objective:  Vital signs in last 24 hours:  Temp:  [98.2 F (36.8 C)-98.5 F (36.9 C)] 98.4 F (36.9 C) (07/03 0700) Pulse Rate:  [46-117] 108 (07/03 0900) Resp:  [8-22] 20 (07/03 0900) BP: (88-126)/(55-83) 113/67 mmHg (07/03 0900) SpO2:  [96 %-100 %] 98 % (07/03 0900) Weight:  [68.9 kg (151 lb 14.4 oz)] 68.9 kg (151 lb 14.4 oz) (07/03 0500)  Weight change: -0.2 kg (-7.1 oz) Filed Weights   12/13/14 0500 12/14/14 0500 12/15/14 0500  Weight: 65 kg (143 lb 4.8 oz) 69.1 kg (152 lb 5.4 oz) 68.9 kg (151 lb 14.4 oz)    Intake/Output: I/O last 3 completed shifts: In: 7514.5 [I.V.:10; Blood:400; Other:1025; NG/GT:1700; IV Piggyback:1550] Out: 2000 [Urine:900; Emesis/NG output:100; Stool:1000]   Intake/Output this shift:  Total I/O In: 74 [IV Piggyback:200; TPN:210] Out: -   Physical Exam: General: lethargic, ill appearing  Head: NG in place  Eyes: Anicteric  Neck: Supple, trachea midline  Lungs:  Clear to auscultation normal effort  Heart: S1S2 no rubs  Abdomen:  significantly distended, mild diffuse tenderness, decreased bowel sounds  Extremities: Trace LE edema  Neurologic: Currently awake, not consistently following commands  Skin: No acute rashes  Access:   Foley with scant urine in bag  Basic Metabolic Panel:  Recent Labs Lab 12/09/14 0509  12/10/14 0500  12/11/14 0537  12/12/14 0535 12/12/14 1516 12/13/14 0448 12/13/14 1324 12/14/14 0502 12/14/14 1620 12/15/14 0529  NA 153*  --  149*  --  145  --  143  --  145  --  143 SEE COMMENTS 143  K 3.1*  < > 3.2*  < > 3.2*  < > 3.2* 3.2* 3.4* 3.6 3.1* 3.5 3.1*  CL 125*  --  120*  --  115*  --  112*  --  111  --  112* 110 112*  CO2 22  --  24  --  24  --  24  --  23  --  24 22 20*  GLUCOSE 131*  --  132*  --  161*  --  177*  --  192*  --  158* 182* 148*  BUN 53*  --  41*  --  49*  --  60*  --  64*  --  78* 83* 92*  CREATININE 2.25*   --  1.92*  --  2.85*  --  3.73* 3.91* 4.26*  --  4.91* 5.34* 5.50*  CALCIUM 8.5*  --  8.6*  --  7.8*  --  7.8*  --  8.7*  --  8.0* 8.0* 8.0*  MG 2.1  --  1.9  --  2.0  --  2.1  --  2.2  --  2.3  --   --   PHOS 3.2  --  3.1  --   --   --  5.0*  --  6.2*  --  5.9*  --   --   < > = values in this interval not displayed.  Liver Function Tests:  Recent Labs Lab 12/12/14 0535 12/14/14 0502  AST 13* 12*  ALT 9* 9*  ALKPHOS 112 114  BILITOT 0.7 0.6  PROT 5.2* 5.3*  ALBUMIN 2.1* 2.1*   No results for input(s): LIPASE, AMYLASE in the last 168 hours. No results for input(s): AMMONIA in the last 168 hours.  CBC:  Recent Labs Lab 12/11/14 0537 12/12/14 0535  12/13/14 0448 12/14/14 0502 12/14/14 1955 12/15/14 0529  WBC 3.6* 6.4 11.1* 10.0  --  9.4  NEUTROABS 2.8 5.0 7.9* 8.0*  --  8.4*  HGB 8.6* 7.9* 8.2* 7.3* 8.0* 7.5*  HCT 25.8* 23.8* 25.1* 22.4* 23.4* 22.6*  MCV 87.4 87.7 87.7 87.7  --  84.7  PLT 10* 16* 36* 36*  --  43*    Cardiac Enzymes: No results for input(s): CKTOTAL, CKMB, CKMBINDEX, TROPONINI in the last 168 hours.  BNP: Invalid input(s): POCBNP  CBG:  Recent Labs Lab 12/14/14 1056 12/14/14 1800 12/14/14 2336 12/15/14 0538 12/15/14 0707  GLUCAP 153* 166* 151* 137* 150*    Microbiology: Results for orders placed or performed during the hospital encounter of 11/29/14  MRSA PCR Screening     Status: None   Collection Time: 11/29/14 12:13 PM  Result Value Ref Range Status   MRSA by PCR NEGATIVE NEGATIVE Final    Comment:        The GeneXpert MRSA Assay (FDA approved for NASAL specimens only), is one component of a comprehensive MRSA colonization surveillance program. It is not intended to diagnose MRSA infection nor to guide or monitor treatment for MRSA infections.   Culture, blood (routine x 2)     Status: None   Collection Time: 11/29/14  1:19 PM  Result Value Ref Range Status   Specimen Description BLOOD  Final   Special Requests  Immunocompromised  Final   Culture NO GROWTH 5 DAYS  Final   Report Status 12/04/2014 FINAL  Final  Urine culture     Status: None   Collection Time: 11/30/14 10:12 AM  Result Value Ref Range Status   Specimen Description URINE, CATHETERIZED  Final   Special Requests Immunocompromised  Final   Culture NO GROWTH 2 DAYS  Final   Report Status 12/02/2014 FINAL  Final  Culture, blood (routine x 2)     Status: None   Collection Time: 12/06/14  8:08 PM  Result Value Ref Range Status   Specimen Description BLOOD  Final   Special Requests NONE  Final   Culture  Setup Time   Final    GRAM POSITIVE COCCI IN BOTH AEROBIC AND ANAEROBIC BOTTLES CRITICAL RESULT CALLED TO, READ BACK BY AND VERIFIED WITH: JENNIFER BEZARD AT 5277 12/08/14.PMH CONFIRMED BY RWW    Culture   Final    STAPHYLOCOCCUS AURICULARIS IN BOTH AEROBIC AND ANAEROBIC BOTTLES    Report Status 12/11/2014 FINAL  Final   Organism ID, Bacteria STAPHYLOCOCCUS AURICULARIS  Final      Susceptibility   Staphylococcus auricularis - MIC*    CIPROFLOXACIN >=8 RESISTANT Resistant     ERYTHROMYCIN >=8 RESISTANT Resistant     GENTAMICIN <=0.5 SENSITIVE Sensitive     OXACILLIN >=4 RESISTANT Resistant     TETRACYCLINE <=1 SENSITIVE Sensitive     VANCOMYCIN 1 SENSITIVE Sensitive     CLINDAMYCIN <=0.25 SENSITIVE Sensitive     TRIMETH/SULFA Value in next row Sensitive      SENSITIVE<=20    LEVOFLOXACIN Value in next row Intermediate      INTERMEDIATE4    * STAPHYLOCOCCUS AURICULARIS  Culture, blood (routine x 2)     Status: None   Collection Time: 12/06/14  8:40 PM  Result Value Ref Range Status   Specimen Description BLOOD  Final   Special Requests NONE  Final   Culture NO GROWTH 5 DAYS  Final   Report Status 12/11/2014 FINAL  Final  Culture, blood (routine x 2)  Status: None   Collection Time: 12/08/14 11:40 AM  Result Value Ref Range Status   Specimen Description BLOOD  Final   Special Requests Normal  Final   Culture NO  GROWTH 6 DAYS  Final   Report Status 12/14/2014 FINAL  Final  Culture, blood (routine x 2)     Status: None   Collection Time: 12/08/14 11:49 AM  Result Value Ref Range Status   Specimen Description BLOOD  Final   Special Requests Normal  Final   Culture NO GROWTH 6 DAYS  Final   Report Status 12/14/2014 FINAL  Final  C difficile quick scan w PCR reflex ([facility] only)     Status: None   Collection Time: 12/14/14 10:11 AM  Result Value Ref Range Status   C Diff antigen NEGATIVE  Final   C Diff toxin NEGATIVE  Final   C Diff interpretation Negative for C. difficile  Final    Coagulation Studies: No results for input(s): LABPROT, INR in the last 72 hours.  Urinalysis: No results for input(s): COLORURINE, LABSPEC, PHURINE, GLUCOSEU, HGBUR, BILIRUBINUR, KETONESUR, PROTEINUR, UROBILINOGEN, NITRITE, LEUKOCYTESUR in the last 72 hours.  Invalid input(s): APPERANCEUR    Imaging: Dg Abd 1 View  12/14/2014   CLINICAL DATA:  Ileus  EXAM: ABDOMEN - 1 VIEW  COMPARISON:  12/13/2014  FINDINGS: NG tube tip is in the distal stomach. Left ureteral stent remains in place, unchanged. Small left lower pole renal stone noted.  Mild gaseous distention of bowel loops in the lower at the, likely distal small bowel loops and proximal colon. Decreasing distention of the transverse colon. Otherwise no real change. No free air organomegaly.  IMPRESSION: NG tube tip in the distal stomach.  Slight decreased distention of the colon with stable mild gaseous distention of bowel loops in the lower abdomen and upper pelvis.   Electronically Signed   By: Rolm Baptise M.D.   On: 12/14/2014 09:43   Dg Abd 2 Views  12/15/2014   CLINICAL DATA:  Pseudo obstruction of the colon  EXAM: ABDOMEN - 2 VIEW  COMPARISON:  12/14/2014  FINDINGS: NG tube is in the stomach. There is a left ureteral stent remaining in place. Increasing dilatation of small bowel loops throughout the abdomen and upper pelvis. Continued mild gaseous distention of  the cecum. No free air organomegaly.  IMPRESSION: Increasing gaseous distention of small bowel loops, now moderately dilated.   Electronically Signed   By: Rolm Baptise M.D.   On: 12/15/2014 10:39     Medications:   . TPN (CLINIMIX) Adult without lytes 70 mL/hr at 12/14/14 1900  . TPN (CLINIMIX) Adult without lytes     . antiseptic oral rinse  7 mL Mouth Rinse BID  . bisacodyl  10 mg Rectal Daily  . insulin aspart  1-3 Units Subcutaneous Q6H  . lactulose  30 g Oral BID  . methylPREDNISolone (SOLU-MEDROL) injection  60 mg Intravenous Once  . mometasone-formoterol  2 puff Inhalation BID  . pantoprazole (PROTONIX) IV  40 mg Intravenous Q12H  . potassium chloride  20 mEq Oral BID  . potassium chloride  10 mEq Intravenous Q1 Hr x 4  . scopolamine  1 patch Transdermal Q72H  . sennosides  10 mL Oral BID  . thiamine IV  100 mg Intravenous Daily  . ziprasidone  10 mg Intramuscular QHS   acetaminophen **OR** acetaminophen, albuterol, bethanechol, haloperidol lactate, heparin lock flush, heparin lock flush, magnesium hydroxide, metoprolol, morphine injection, ondansetron (ZOFRAN) IV, promethazine, sodium  chloride, sodium chloride, sodium chloride, vancomycin, ziprasidone  Assessment/ Plan:  89 AAM with progressive mantle cell lymphoma, RICE chemo in 11/2014, hx left sided hydronephrosis and hydroureter. S/p ureteral stent placement    1.  Acute renal failure due to ATN. 2.  Sepsis with klebsiella oxytoca. 3.  Hypernatremia, resolved 4.  Hypokalemia. 5.  Anemia unspecified D64.9  Plan:   Renal function appears to be worse today.  Cr up 5.5, BUN also rising.   As before patient would be a poor candidate for long term dialysis as he would likely require stretcher dialysis. K reamins low.  Pt to receive KCL 28meq IV x 4 today which we agree with.  This should lead to a K of 3.5.  Pt remains on TPN which is contributing to azotemia.   Will follow progress closely, no urgent indication for HD  at present.    LOS: [place], Jaclene Bartelt 7/3/201610:57 AM

## 2014-12-15 NOTE — Progress Notes (Signed)
MEDICATION RELATED CONSULT NOTE - FOLLOW UP   Pharmacy Consult for Electrolytes Indication:   No Known Allergies  Patient Measurements: Height: 6' (182.9 cm) Weight: 151 lb 14.4 oz (68.9 kg) IBW/kg (Calculated) : 77.6 Adjusted Body Weight:   Vital Signs: Temp: 97.6 F (36.4 C) (07/03 1436) Temp Source: Oral (07/03 1436) BP: 116/61 mmHg (07/03 1436) Pulse Rate: 99 (07/03 1436) Intake/Output from previous day: 07/02 0701 - 07/03 0700 In: 3944.5 [I.V.:10; Blood:400; NG/GT:500; IV Piggyback:550; TPN:2109.5] Out: 2000 [Urine:900; Emesis/NG output:100; Stool:1000] Intake/Output from this shift: Total I/O In: 890 [IV Piggyback:400; TPN:490] Out: 550 [Urine:550]  Labs:  Recent Labs  12/13/14 0448 12/14/14 0502 12/14/14 1620 12/14/14 1955 12/15/14 0529 12/15/14 1508  WBC 11.1* 10.0  --   --  9.4  --   HGB 8.2* 7.3*  --  8.0* 7.5*  --   HCT 25.1* 22.4*  --  23.4* 22.6*  --   PLT 36* 36*  --   --  43*  --   CREATININE 4.26* 4.91* 5.34*  --  5.50* 5.74*  MG 2.2 2.3  --   --   --   --   PHOS 6.2* 5.9*  --   --   --   --   ALBUMIN  --  2.1*  --   --   --   --   PROT  --  5.3*  --   --   --   --   AST  --  12*  --   --   --   --   ALT  --  9*  --   --   --   --   ALKPHOS  --  114  --   --   --   --   BILITOT  --  0.6  --   --   --   --    Estimated Creatinine Clearance: 12.2 mL/min (by C-G formula based on Cr of 5.74).   Microbiology: Recent Results (from the past 720 hour(s))  Culture, blood (single)     Status: None   Collection Time: 11/29/14 11:09 AM  Result Value Ref Range Status   Specimen Description BLOOD  Final   Special Requests NONE  Final   Culture  Setup Time   Final    GRAM NEGATIVE RODS AEROBIC BOTTLE ONLY CRITICAL RESULT CALLED TO, READ BACK BY AND VERIFIED WITH: SANDRA VORBA ON 11/30/14 AT 0825 BY JEF    Culture KLEBSIELLA OXYTOCA AEROBIC BOTTLE ONLY   Final   Report Status 12/04/2014 FINAL  Final   Organism ID, Bacteria KLEBSIELLA OXYTOCA   Final      Susceptibility   Klebsiella oxytoca - MIC*    AMPICILLIN >=32 RESISTANT Resistant     CEFTAZIDIME <=1 SENSITIVE Sensitive     CEFAZOLIN >=64 RESISTANT Resistant     CEFTRIAXONE <=1 SENSITIVE Sensitive     CIPROFLOXACIN <=0.25 SENSITIVE Sensitive     GENTAMICIN <=1 SENSITIVE Sensitive     IMIPENEM <=0.25 SENSITIVE Sensitive     TRIMETH/SULFA <=20 SENSITIVE Sensitive     CEFOXITIN <=4 SENSITIVE Sensitive     PIP/TAZO Value in next row Resistant      RESISTANT128    * KLEBSIELLA OXYTOCA  Urine culture     Status: None   Collection Time: 11/29/14 11:22 AM  Result Value Ref Range Status   Specimen Description URINE, CLEAN CATCH  Final   Special Requests NONE  Final   Culture >=100,000 COLONIES/mL KLEBSIELLA  OXYTOCA  Final   Report Status 12/01/2014 FINAL  Final   Organism ID, Bacteria KLEBSIELLA OXYTOCA  Final      Susceptibility   Klebsiella oxytoca - MIC*    AMPICILLIN >=32 RESISTANT Resistant     CEFAZOLIN >=64 RESISTANT Resistant     CEFTRIAXONE 4 SENSITIVE Sensitive     CIPROFLOXACIN <=0.25 SENSITIVE Sensitive     GENTAMICIN <=1 SENSITIVE Sensitive     IMIPENEM <=0.25 SENSITIVE Sensitive     NITROFURANTOIN <=16 SENSITIVE Sensitive     TRIMETH/SULFA <=20 SENSITIVE Sensitive     CEFOXITIN <=4 SENSITIVE Sensitive     * >=100,000 COLONIES/mL KLEBSIELLA OXYTOCA  MRSA PCR Screening     Status: None   Collection Time: 11/29/14 12:13 PM  Result Value Ref Range Status   MRSA by PCR NEGATIVE NEGATIVE Final    Comment:        The GeneXpert MRSA Assay (FDA approved for NASAL specimens only), is one component of a comprehensive MRSA colonization surveillance program. It is not intended to diagnose MRSA infection nor to guide or monitor treatment for MRSA infections.   Culture, blood (routine x 2)     Status: None   Collection Time: 11/29/14  1:19 PM  Result Value Ref Range Status   Specimen Description BLOOD  Final   Special Requests Immunocompromised  Final    Culture NO GROWTH 5 DAYS  Final   Report Status 12/04/2014 FINAL  Final  Urine culture     Status: None   Collection Time: 11/30/14 10:12 AM  Result Value Ref Range Status   Specimen Description URINE, CATHETERIZED  Final   Special Requests Immunocompromised  Final   Culture NO GROWTH 2 DAYS  Final   Report Status 12/02/2014 FINAL  Final  Culture, blood (routine x 2)     Status: None   Collection Time: 12/06/14  8:08 PM  Result Value Ref Range Status   Specimen Description BLOOD  Final   Special Requests NONE  Final   Culture  Setup Time   Final    GRAM POSITIVE COCCI IN BOTH AEROBIC AND ANAEROBIC BOTTLES CRITICAL RESULT CALLED TO, READ BACK BY AND VERIFIED WITH: JENNIFER BEZARD AT 6629 12/08/14.PMH CONFIRMED BY RWW    Culture   Final    STAPHYLOCOCCUS AURICULARIS IN BOTH AEROBIC AND ANAEROBIC BOTTLES    Report Status 12/11/2014 FINAL  Final   Organism ID, Bacteria STAPHYLOCOCCUS AURICULARIS  Final      Susceptibility   Staphylococcus auricularis - MIC*    CIPROFLOXACIN >=8 RESISTANT Resistant     ERYTHROMYCIN >=8 RESISTANT Resistant     GENTAMICIN <=0.5 SENSITIVE Sensitive     OXACILLIN >=4 RESISTANT Resistant     TETRACYCLINE <=1 SENSITIVE Sensitive     VANCOMYCIN 1 SENSITIVE Sensitive     CLINDAMYCIN <=0.25 SENSITIVE Sensitive     TRIMETH/SULFA Value in next row Sensitive      SENSITIVE<=20    LEVOFLOXACIN Value in next row Intermediate      INTERMEDIATE4    * STAPHYLOCOCCUS AURICULARIS  Culture, blood (routine x 2)     Status: None   Collection Time: 12/06/14  8:40 PM  Result Value Ref Range Status   Specimen Description BLOOD  Final   Special Requests NONE  Final   Culture NO GROWTH 5 DAYS  Final   Report Status 12/11/2014 FINAL  Final  Culture, blood (routine x 2)     Status: None   Collection Time: 12/08/14 11:40 AM  Result Value Ref Range Status   Specimen Description BLOOD  Final   Special Requests Normal  Final   Culture NO GROWTH 6 DAYS  Final   Report  Status 12/14/2014 FINAL  Final  Culture, blood (routine x 2)     Status: None   Collection Time: 12/08/14 11:49 AM  Result Value Ref Range Status   Specimen Description BLOOD  Final   Special Requests Normal  Final   Culture NO GROWTH 6 DAYS  Final   Report Status 12/14/2014 FINAL  Final  C difficile quick scan w PCR reflex ([facility] only)     Status: None   Collection Time: 12/14/14 10:11 AM  Result Value Ref Range Status   C Diff antigen NEGATIVE  Final   C Diff toxin NEGATIVE  Final   C Diff interpretation Negative for C. difficile  Final    Medications:  Scheduled:  . antiseptic oral rinse  7 mL Mouth Rinse BID  . bisacodyl  10 mg Rectal Daily  . insulin aspart  1-3 Units Subcutaneous Q6H  . lactulose  30 g Oral BID  . methylPREDNISolone (SOLU-MEDROL) injection  60 mg Intravenous Once  . mometasone-formoterol  2 puff Inhalation BID  . pantoprazole (PROTONIX) IV  40 mg Intravenous Q12H  . potassium chloride  20 mEq Oral BID  . potassium chloride  10 mEq Intravenous Q1 Hr x 2  . scopolamine  1 patch Transdermal Q72H  . sennosides  10 mL Oral BID  . thiamine IV  100 mg Intravenous Daily  . ziprasidone  10 mg Intramuscular QHS    Assessment: 7/3 :  K @ 1600 = 3.3  Goal of Therapy:    Plan:  Will order additional KCl 20 mEq bolus and recheck electrolytes tomorrow with AM labs.   Britta Louth D 12/15/2014,5:12 PM

## 2014-12-15 NOTE — Progress Notes (Signed)
Informed Dr. Darvin Neighbours of hemoglobin of 7.5 and he said the goal hemoglobin is 7.0 or above.

## 2014-12-16 ENCOUNTER — Inpatient Hospital Stay: Payer: Medicare Other

## 2014-12-16 ENCOUNTER — Encounter: Payer: Self-pay | Admitting: Infectious Diseases

## 2014-12-16 LAB — MAGNESIUM: Magnesium: 1.8 mg/dL (ref 1.7–2.4)

## 2014-12-16 LAB — CBC WITH DIFFERENTIAL/PLATELET
BAND NEUTROPHILS: 2 % (ref 0–10)
BLASTS: 0 %
Basophils Absolute: 0 10*3/uL (ref 0.0–0.1)
Basophils Relative: 0 % (ref 0–1)
Eosinophils Absolute: 0 10*3/uL (ref 0.0–0.7)
Eosinophils Relative: 0 % (ref 0–5)
HCT: 22.1 % — ABNORMAL LOW (ref 40.0–52.0)
Hemoglobin: 7.1 g/dL — ABNORMAL LOW (ref 13.0–18.0)
LYMPHS ABS: 1.3 10*3/uL (ref 0.7–4.0)
Lymphocytes Relative: 14 % (ref 12–46)
MCH: 27.6 pg (ref 26.0–34.0)
MCHC: 32.1 g/dL (ref 32.0–36.0)
MCV: 85.9 fL (ref 80.0–100.0)
METAMYELOCYTES PCT: 5 %
MONO ABS: 0.5 10*3/uL (ref 0.1–1.0)
MONOS PCT: 5 % (ref 3–12)
Myelocytes: 1 %
Neutro Abs: 7.6 10*3/uL (ref 1.7–7.7)
Neutrophils Relative %: 73 % (ref 43–77)
PROMYELOCYTES ABS: 0 %
Platelets: 59 10*3/uL — ABNORMAL LOW (ref 150–440)
RBC: 2.57 MIL/uL — ABNORMAL LOW (ref 4.40–5.90)
RDW: 20.2 % — AB (ref 11.5–14.5)
WBC: 9.4 10*3/uL (ref 3.8–10.6)
nRBC: 0 /100 WBC

## 2014-12-16 LAB — GLUCOSE, CAPILLARY
Glucose-Capillary: 141 mg/dL — ABNORMAL HIGH (ref 65–99)
Glucose-Capillary: 159 mg/dL — ABNORMAL HIGH (ref 65–99)
Glucose-Capillary: 113 mg/dL — ABNORMAL HIGH (ref 65–99)

## 2014-12-16 LAB — BASIC METABOLIC PANEL
Anion gap: 9 (ref 5–15)
BUN: 89 mg/dL — AB (ref 6–20)
CO2: 19 mmol/L — ABNORMAL LOW (ref 22–32)
Calcium: 8.5 mg/dL — ABNORMAL LOW (ref 8.9–10.3)
Chloride: 116 mmol/L — ABNORMAL HIGH (ref 101–111)
Creatinine, Ser: 6 mg/dL — ABNORMAL HIGH (ref 0.61–1.24)
GFR calc Af Amer: 10 mL/min — ABNORMAL LOW (ref >60)
GFR calc non Af Amer: 9 mL/min — ABNORMAL LOW (ref >60)
Glucose, Bld: 145 mg/dL — ABNORMAL HIGH (ref 65–99)
Potassium: 3 mmol/L — ABNORMAL LOW (ref 3.5–5.1)
SODIUM: 144 mmol/L (ref 135–145)

## 2014-12-16 LAB — PHOSPHORUS: Phosphorus: 4.5 mg/dL (ref 2.5–4.6)

## 2014-12-16 LAB — POTASSIUM: Potassium: 3.2 mmol/L — ABNORMAL LOW (ref 3.5–5.1)

## 2014-12-16 LAB — VANCOMYCIN, TROUGH: VANCOMYCIN TR: 31 ug/mL — AB (ref 10–20)

## 2014-12-16 MED ORDER — POTASSIUM CHLORIDE 20 MEQ PO PACK: 40.0000 meq | PACK | Freq: Three times a day (TID) | ORAL | Status: DC

## 2014-12-16 MED ORDER — POTASSIUM CHLORIDE 20 MEQ PO PACK
40.0000 meq | PACK | Freq: Two times a day (BID) | ORAL | Status: DC
  Administered 2014-12-16 – 2014-12-18 (×5): 40 meq via ORAL
  Filled 2014-12-16 (×5): qty 2

## 2014-12-16 MED ORDER — SODIUM CHLORIDE 0.9 % IV SOLN
Freq: Once | INTRAVENOUS | Status: AC
  Administered 2014-12-16: 11:00:00 via INTRAVENOUS
  Filled 2014-12-16: qty 20

## 2014-12-16 MED ORDER — POTASSIUM CHLORIDE 10 MEQ/100ML IV SOLN
10.0000 meq | INTRAVENOUS | Status: AC
  Filled 2014-12-16 (×5): qty 100

## 2014-12-16 MED ORDER — TRACE MINERALS CR-CU-MN-SE-ZN 10-1000-500-60 MCG/ML IV SOLN
INTRAVENOUS | Status: AC
  Administered 2014-12-16: 19:00:00 via INTRAVENOUS
  Filled 2014-12-16: qty 1680

## 2014-12-16 MED ORDER — MAGNESIUM SULFATE IN D5W 10-5 MG/ML-% IV SOLN
1.0000 g | Freq: Once | INTRAVENOUS | Status: AC
  Administered 2014-12-16: 1 g via INTRAVENOUS
  Filled 2014-12-16: qty 100

## 2014-12-16 MED ORDER — POTASSIUM CHLORIDE 10 MEQ/100ML IV SOLN
10.0000 meq | INTRAVENOUS | Status: AC
  Administered 2014-12-16 (×2): 10 meq via INTRAVENOUS
  Filled 2014-12-16 (×2): qty 100

## 2014-12-16 MED ORDER — MAGNESIUM SULFATE 2 GM/50ML IV SOLN
2.0000 g | Freq: Once | INTRAVENOUS | Status: DC
  Filled 2014-12-16: qty 50

## 2014-12-16 NOTE — Consult Note (Signed)
Subjective: Patient seen for ileus, colonic pseudoobstruction.  Patient denies abdominal pain or nausea.    Objective: Vital signs in last 24 hours: Temp:  [97.9 F (36.6 C)-98.4 F (36.9 C)] 98 F (36.7 C) (07/04 1147) Pulse Rate:  [104-108] 106 (07/04 1147) Resp:  [12-20] 18 (07/04 1147) BP: (108-120)/(53-67) 119/67 mmHg (07/04 1147) SpO2:  [98 %-100 %] 98 % (07/04 1147) Weight:  [63.504 kg (140 lb)] 63.504 kg (140 lb) (07/04 0549) Blood pressure 119/67, pulse 106, temperature 98 F (36.7 C), temperature source Oral, resp. rate 18, height 6' (1.829 m), weight 63.504 kg (140 lb), SpO2 98 %.   Intake/Output from previous day: 07/03 0701 - 07/04 0700 In: 1298 [IV Piggyback:500; TPN:798] Out: 3100 [Urine:1950; Emesis/NG output:750; Stool:400]  Intake/Output this shift: Total I/O In: -  Out: 650 [Urine:600; Stool:50]   General appearance:  Critically ill appearing.  Resp:  cta Cardio:  rrr without r/g GI:  Soft, less distended than noted on yesterdays exam, nontender, bowel sounds positive intermittant, not high pitch.  Extremities:  No cce   Lab Results: Results for orders placed or performed during the hospital encounter of 11/29/14 (from the past 24 hour(s))  Basic metabolic panel     Status: Abnormal   Collection Time: 12/15/14  3:08 PM  Result Value Ref Range   Sodium 142 135 - 145 mmol/L   Potassium 3.3 (L) 3.5 - 5.1 mmol/L   Chloride 114 (H) 101 - 111 mmol/L   CO2 20 (L) 22 - 32 mmol/L   Glucose, Bld 138 (H) 65 - 99 mg/dL   BUN 99 (H) 6 - 20 mg/dL   Creatinine, Ser 5.74 (H) 0.61 - 1.24 mg/dL   Calcium 8.2 (L) 8.9 - 10.3 mg/dL   GFR calc non Af Amer 9 (L) >60 mL/min   GFR calc Af Amer 11 (L) >60 mL/min   Anion gap 8 5 - 15  Glucose, capillary     Status: Abnormal   Collection Time: 12/15/14  5:30 PM  Result Value Ref Range   Glucose-Capillary 119 (H) 65 - 99 mg/dL   Comment 1 Notify RN    Comment 2 Document in Chart   Glucose, capillary     Status: Abnormal    Collection Time: 12/15/14  7:50 PM  Result Value Ref Range   Glucose-Capillary 131 (H) 65 - 99 mg/dL  Glucose, capillary     Status: Abnormal   Collection Time: 12/15/14 11:49 PM  Result Value Ref Range   Glucose-Capillary 158 (H) 65 - 99 mg/dL  CBC with Differential     Status: Abnormal   Collection Time: 12/16/14  5:24 AM  Result Value Ref Range   WBC 9.4 3.8 - 10.6 K/uL   RBC 2.57 (L) 4.40 - 5.90 MIL/uL   Hemoglobin 7.1 (L) 13.0 - 18.0 g/dL   HCT 22.1 (L) 40.0 - 52.0 %   MCV 85.9 80.0 - 100.0 fL   MCH 27.6 26.0 - 34.0 pg   MCHC 32.1 32.0 - 36.0 g/dL   RDW 20.2 (H) 11.5 - 14.5 %   Platelets 59 (L) 150 - 440 K/uL   Neutrophils Relative % 73 43 - 77 %   Lymphocytes Relative 14 12 - 46 %   Monocytes Relative 5 3 - 12 %   Eosinophils Relative 0 0 - 5 %   Basophils Relative 0 0 - 1 %   Band Neutrophils 2 0 - 10 %   Metamyelocytes Relative 5 %   Myelocytes  1 %   Promyelocytes Absolute 0 %   Blasts 0 %   nRBC 0 0 /100 WBC   Neutro Abs 7.6 1.7 - 7.7 K/uL   Lymphs Abs 1.3 0.7 - 4.0 K/uL   Monocytes Absolute 0.5 0.1 - 1.0 K/uL   Eosinophils Absolute 0.0 0.0 - 0.7 K/uL   Basophils Absolute 0.0 0.0 - 0.1 K/uL   RBC Morphology POLYCHROMASIA PRESENT    WBC Morphology TOXIC GRANULATION   Vancomycin, trough     Status: Abnormal   Collection Time: 12/16/14  5:24 AM  Result Value Ref Range   Vancomycin Tr 31 (HH) 10 - 20 ug/mL  Basic metabolic panel     Status: Abnormal   Collection Time: 12/16/14  5:24 AM  Result Value Ref Range   Sodium 144 135 - 145 mmol/L   Potassium 3.0 (L) 3.5 - 5.1 mmol/L   Chloride 116 (H) 101 - 111 mmol/L   CO2 19 (L) 22 - 32 mmol/L   Glucose, Bld 145 (H) 65 - 99 mg/dL   BUN 89 (H) 6 - 20 mg/dL   Creatinine, Ser 6.00 (H) 0.61 - 1.24 mg/dL   Calcium 8.5 (L) 8.9 - 10.3 mg/dL   GFR calc non Af Amer 9 (L) >60 mL/min   GFR calc Af Amer 10 (L) >60 mL/min   Anion gap 9 5 - 15  Magnesium     Status: None   Collection Time: 12/16/14  5:24 AM  Result Value  Ref Range   Magnesium 1.8 1.7 - 2.4 mg/dL  Phosphorus     Status: None   Collection Time: 12/16/14  5:24 AM  Result Value Ref Range   Phosphorus 4.5 2.5 - 4.6 mg/dL  Glucose, capillary     Status: Abnormal   Collection Time: 12/16/14  5:46 AM  Result Value Ref Range   Glucose-Capillary 141 (H) 65 - 99 mg/dL  Glucose, capillary     Status: Abnormal   Collection Time: 12/16/14 11:48 AM  Result Value Ref Range   Glucose-Capillary 159 (H) 65 - 99 mg/dL   Comment 1 Notify RN   Potassium     Status: Abnormal   Collection Time: 12/16/14  2:14 PM  Result Value Ref Range   Potassium 3.2 (L) 3.5 - 5.1 mmol/L      Recent Labs  12/14/14 0502 12/14/14 1955 12/15/14 0529 12/16/14 0524  WBC 10.0  --  9.4 9.4  HGB 7.3* 8.0* 7.5* 7.1*  HCT 22.4* 23.4* 22.6* 22.1*  PLT 36*  --  43* 59*   BMET  Recent Labs  12/15/14 0529 12/15/14 1508 12/16/14 0524 12/16/14 1414  NA 143 142 144  --   K 3.1* 3.3* 3.0* 3.2*  CL 112* 114* 116*  --   CO2 20* 20* 19*  --   GLUCOSE 148* 138* 145*  --   BUN 92* 99* 89*  --   CREATININE 5.50* 5.74* 6.00*  --   CALCIUM 8.0* 8.2* 8.5*  --    LFT  Recent Labs  12/14/14 0502  PROT 5.3*  ALBUMIN 2.1*  AST 12*  ALT 9*  ALKPHOS 114  BILITOT 0.6   PT/INR No results for input(s): LABPROT, INR in the last 72 hours. Hepatitis Panel No results for input(s): HEPBSAG, HCVAB, HEPAIGM, HEPBIGM in the last 72 hours. C-Diff  Recent Labs  12/14/14 1011  CDIFFTOX NEGATIVE   No results for input(s): CDIFFPCR in the last 72 hours.   Studies/Results: Dg Abd 2 Views  12/16/2014   CLINICAL DATA:  Followup abdominal distension.  EXAM: ABDOMEN - 2 VIEW  COMPARISON:  Radiographs 12/15/2014  FINDINGS: Improved bowel gas pattern with less distended bowel. No free air. The left double-J ureteral stent is stable. The NG tube is stable.  IMPRESSION: Improved bowel gas pattern with less distended air-filled bowel.   Electronically Signed   By: Marijo Sanes M.D.    On: 12/16/2014 09:57   Dg Abd 2 Views  12/15/2014   CLINICAL DATA:  Pseudo obstruction of the colon  EXAM: ABDOMEN - 2 VIEW  COMPARISON:  12/14/2014  FINDINGS: NG tube is in the stomach. There is a left ureteral stent remaining in place. Increasing dilatation of small bowel loops throughout the abdomen and upper pelvis. Continued mild gaseous distention of the cecum. No free air organomegaly.  IMPRESSION: Increasing gaseous distention of small bowel loops, now moderately dilated.   Electronically Signed   By: Rolm Baptise M.D.   On: 12/15/2014 10:39   Dg Abd Portable 1v  12/16/2014   CLINICAL DATA:  Nasogastric tube placement  EXAM: PORTABLE ABDOMEN - 1 VIEW  COMPARISON:  Portable exam at 1157 hr compared to 12/16/2014 at 0940 hr  FINDINGS: Course of nasogastric tube is poorly visualized in the chest due to motion but appears to be coiled in the proximal stomach.  LEFT nephrostomy tube noted.  Bowel gas pattern poorly assessed due to motion.  RIGHT basilar atelectasis.  IMPRESSION: Nasogastric tube appears coiled in the proximal stomach.   Electronically Signed   By: Lavonia Dana M.D.   On: 12/16/2014 12:17    Scheduled Inpatient Medications:   . antiseptic oral rinse  7 mL Mouth Rinse BID  . insulin aspart  1-3 Units Subcutaneous Q6H  . methylPREDNISolone (SOLU-MEDROL) injection  60 mg Intravenous Once  . mometasone-formoterol  2 puff Inhalation BID  . pantoprazole (PROTONIX) IV  40 mg Intravenous Q12H  . potassium chloride  40 mEq Oral BID  . potassium chloride  10 mEq Intravenous Q1 Hr x 5  . scopolamine  1 patch Transdermal Q72H  . thiamine IV  100 mg Intravenous Daily  . ziprasidone  10 mg Intramuscular QHS    Continuous Inpatient Infusions:   . TPN (CLINIMIX) Adult without lytes 70 mL/hr at 12/15/14 1841  . TPN (CLINIMIX) Adult without lytes      PRN Inpatient Medications:  acetaminophen **OR** acetaminophen, albuterol, bethanechol, haloperidol lactate, heparin lock flush, heparin  lock flush, magnesium hydroxide, metoprolol, morphine injection, ondansetron (ZOFRAN) IV, promethazine, sodium chloride, sodium chloride, sodium chloride, vancomycin, ziprasidone  Miscellaneous:   Assessment:  1) ileus, colonic pseudoobstruction/ileus-mild clinical improvement today.  Note electrolyte abnormalities.   Plan:  1) continue current, no plans for endoscopic intervention. Following.   Lollie Sails MD 12/16/2014, 2:55 PM

## 2014-12-16 NOTE — Progress Notes (Signed)
[place] at [facility]                                                                                                                                                                                            Patient Demographics   Caleb Francis, is a 69 y.o. male, DOB - 1946-05-24, FKC:[id number]  Admit date - 11/29/2014   Admitting Physician Aldean Jewett, MD  Outpatient Primary MD for the patient is No PCP Per Patient   Subjective:   No concerns from patient. Awake but drowzy. NG reinserted due to vomiting, distension. Poor UOP  Review of Systems:   ROS drowzy  Vitals:   Filed Vitals:   12/15/14 1436 12/15/14 1953 12/16/14 0549 12/16/14 0840  BP: 116/61 108/65 108/53 120/56  Pulse: 99 108 104 108  Temp: 97.6 F (36.4 C) 97.9 F (36.6 C) 98.1 F (36.7 C) 98.4 F (36.9 C)  TempSrc: Oral Oral Oral Oral  Resp: 16 20 20 12   Height:      Weight:   63.504 kg (140 lb)   SpO2: 100% 100% 99% 100%    Wt Readings from Last 3 Encounters:  12/16/14 63.504 kg (140 lb)  11/27/14 68.2 kg (150 lb 5.7 oz)  11/25/14 70.761 kg (156 lb)     Intake/Output Summary (Last 24 hours) at 12/16/14 1115 Last data filed at 12/16/14 7494  Gross per 24 hour  Intake    618 ml  Output   3100 ml  Net  -2482 ml    Physical Exam:   GENERAL: critically ill-appearing PERRLA Atraumatic  NECK: Supple. There is no jugular venous distention. No bruits, no lymphadenopathy, no thyromegaly.  HEART:  Tachycardic 2/6 systolic ejection murmur no rubs LUNGS: Bilateral rhonchi and wheezing.  ABDOMEN: Distended bowel hypoactive without rebound or guarding  EXTREMITIES: No evidence of any cyanosis, clubbing,   NEUROLOGIC: Lethargic  SKIN: Moist and warm with no rashes appreciated.  Psych:    no agitation reported overnight.  Radiology Reports 12/10/2014: CT abdomen: Large distended colonic ileus. No obstruction  12/12/2014: KUB   CBC  Recent  Labs Lab 12/12/14 0535 12/13/14 0448 12/14/14 0502 12/14/14 1955 12/15/14 0529 12/16/14 0524  WBC 6.4 11.1* 10.0  --  9.4 9.4  HGB 7.9* 8.2* 7.3* 8.0* 7.5* 7.1*  HCT 23.8* 25.1* 22.4* 23.4* 22.6* 22.1*  PLT 16* 36* 36*  --  43* 59*  MCV 87.7 87.7 87.7  --  84.7 85.9  MCH 29.0 28.7 28.6  --  27.9 27.6  MCHC 33.1 32.7 32.6  --  33.0 32.1  RDW 18.2* 18.1* 18.2*  --  20.3* 20.2*  LYMPHSABS 1.2 2.3 1.4  --  0.8 1.3  MONOABS 0.1 0.9 0.6  --  0.2 0.5  EOSABS 0.1 0.0 0.0  --  0.0 0.0  BASOSABS 0.0 0.0 0.0  --  0.0 0.0    Chemistries   Recent Labs Lab 12/11/14 0537  12/12/14 0535  12/13/14 0448  12/14/14 0502 12/14/14 1620 12/15/14 0529 12/15/14 1508 12/16/14 0524  NA 145  --  143  --  145  --  143 SEE COMMENTS 143 142 144  K 3.2*  < > 3.2*  < > 3.4*  < > 3.1* 3.5 3.1* 3.3* 3.0*  CL 115*  --  112*  --  111  --  112* 110 112* 114* 116*  CO2 24  --  24  --  23  --  24 22 20* 20* 19*  GLUCOSE 161*  --  177*  --  192*  --  158* 182* 148* 138* 145*  BUN 49*  --  60*  --  64*  --  78* 83* 92* 99* 89*  CREATININE 2.85*  --  3.73*  < > 4.26*  --  4.91* 5.34* 5.50* 5.74* 6.00*  CALCIUM 7.8*  --  7.8*  --  8.7*  --  8.0* 8.0* 8.0* 8.2* 8.5*  MG 2.0  --  2.1  --  2.2  --  2.3  --   --   --  1.8  AST  --   --  13*  --   --   --  12*  --   --   --   --   ALT  --   --  9*  --   --   --  9*  --   --   --   --   ALKPHOS  --   --  112  --   --   --  114  --   --   --   --   BILITOT  --   --  0.7  --   --   --  0.6  --   --   --   --   < > = values in this interval not displayed. ------------------------------------------------------------------------------------------------------------------- No results for input(s): TSH, T4TOTAL, T3FREE, THYROIDAB in the last 72 hours.  Invalid input(s): FREET3 ------------------------------------------------------------------------------------------------------------------  Coagulation profile No results for input(s): INR, PROTIME in the last 168  hours.   Assessment & Plan  This is a 70 year old male with mantle cell lymphoma undergoing chemotherapy who was admitted June 15 from oncology clinic with altered mental status and chest pain.   # Sepsis: With Klebsiella UTI and bacteremia and Staph bacteremia Patient's urine culture and blood cultures were positive for Klebsiella on admission. House. Repeat blood cx on 12/06/2014 are growing  STAPHYLOCOCCUS AURICULARIS . Vancomycin till 12/21/2014.  # altered mental status - This is due to metabolic encephalopathy due to sepsis and urinary tract infection/ARF/Hypernatremia along with ICU delirium. - CT head/MRI brain showing no evidence of acute pathology.  - Mental status much improved  # chest pain on admission: No EKG changes to suggest ischemia.  Continue to monitor for any further cardiac symptoms  # Mantle cell lymphoma: Patient is on salvage chemotherapy. Patient follows with Dr. Mike Gip.   He is s/p chemo He was on GCSF and no longer neutropenic.    # Panctytopenia: Due to chemotherapy and Mantle cell lymphoma, patient is currently on Neupogen. Patient is s/p 2 units PRBC  on 6/25 and 1 unit 12/14/2014 and has received PLT transfusion almost daily. Patient needs premedication prior to any transfusions including Benadryl and Tylenol.   # Acute renal failure due to ATN : Appreciate nephrology input. Patient's creatinine continues to worsen. Patient is poor candidate for dialysis.  # Thrombocytopenia This is due to sepsis/lymphoma and chemo  # colonic Ileus with GI bleed: CT scan shows colonic ileus that has worsened. Patient is no longer neutropenic. Rectal tube in place. Repeat xray showing some improvement. Will d/c bisacodyl suppositories and lactulose due diarrhea. Start daily miralax.  # Hypernatremia - resolved  # GI bleed: Patient currently has an NG tube placed.  Restart PPI. Discussed with Dr. Gustavo Lah. High risk for re bleeding due to  thrombocytopenia.  # Acute blood loss anmeia over pancytopenia Due to GI losses and lymphoma.   Overall Prognosis is very poor   DNR  DVT Prophylaxis   SCD's    Lab Results  Component Value Date   PLT 59* 12/16/2014     Poor prognosis  Total time spent 35 minutes  Greater than 50% of time spent in care and coordination  Hillary Bow R M.D on 12/16/2014 at 11:15 AM  Between 7am to 6pm - Pager - [phone]  After 6pm go to [email or web address] - password [email or web address] [facility]   Office  [phone]

## 2014-12-16 NOTE — Care Management (Signed)
IM given °

## 2014-12-16 NOTE — Clinical Social Work Note (Signed)
FL2 sent to [place] at [facility] to confirm that pt could [facility] to [facility] once he was medically stable.

## 2014-12-16 NOTE — Progress Notes (Signed)
[facility] NOTE  Pharmacy Consult for Vancomycin, TPN Electrolyte Management and Constipation Management Indication: ICU Status   No Known Allergies  Patient Measurements: Height: 6' (182.9 cm) Weight: 140 lb (63.504 kg) IBW/kg (Calculated) : 77.6   Vital Signs: Temp: 98 F (36.7 C) (07/04 1147) Temp Source: Oral (07/04 0840) BP: 119/67 mmHg (07/04 1147) Pulse Rate: 106 (07/04 1147) Intake/Output from previous day: 07/03 0701 - 07/04 0700 In: 1298 [IV Piggyback:500; TPN:798] Out: 3100 [QPRFF:6384; Emesis/NG output:750; Stool:400] Intake/Output from this shift: Total I/O In: -  Out: 650 [Urine:600; Stool:50] Vent settings for last 24 hours:    Labs:  Recent Labs  12/14/14 0502  12/14/14 1955 12/15/14 0529 12/15/14 1508 12/16/14 0524  WBC 10.0  --   --  9.4  --  9.4  HGB 7.3*  --  8.0* 7.5*  --  7.1*  HCT 22.4*  --  23.4* 22.6*  --  22.1*  PLT 36*  --   --  43*  --  59*  CREATININE 4.91*  < >  --  5.50* 5.74* 6.00*  MG 2.3  --   --   --   --  1.8  PHOS 5.9*  --   --   --   --  4.5  ALBUMIN 2.1*  --   --   --   --   --   PROT 5.3*  --   --   --   --   --   AST 12*  --   --   --   --   --   ALT 9*  --   --   --   --   --   ALKPHOS 114  --   --   --   --   --   BILITOT 0.6  --   --   --   --   --   < > = values in this interval not displayed. Estimated Creatinine Clearance: 10.7 mL/min (by C-G formula based on Cr of 6). BMP Latest Ref Rng 12/16/2014 12/16/2014 12/15/2014  Glucose 65 - 99 mg/dL - 145(H) 138(H)  BUN 6 - 20 mg/dL - 89(H) 99(H)  Creatinine 0.61 - 1.24 mg/dL - 6.00(H) 5.74(H)  Sodium 135 - 145 mmol/L - 144 142  Potassium 3.5 - 5.1 mmol/L 3.2(L) 3.0(L) 3.3(L)  Chloride 101 - 111 mmol/L - 116(H) 114(H)  CO2 22 - 32 mmol/L - 19(L) 20(L)  Calcium 8.9 - 10.3 mg/dL - 8.5(L) 8.2(L)     Recent Labs  12/15/14 2349 12/16/14 0546 12/16/14 1148  GLUCAP 158* 141* 159*    Microbiology: Recent Results (from the past 720 hour(s))   Culture, blood (single)     Status: None   Collection Time: 11/29/14 11:09 AM  Result Value Ref Range Status   Specimen Description BLOOD  Final   Special Requests NONE  Final   Culture  Setup Time   Final    GRAM NEGATIVE RODS AEROBIC BOTTLE ONLY CRITICAL RESULT CALLED TO, READ BACK BY AND VERIFIED WITH: SANDRA VORBA ON 11/30/14 AT 0825 BY JEF    Culture KLEBSIELLA OXYTOCA AEROBIC BOTTLE ONLY   Final   Report Status 12/04/2014 FINAL  Final   Organism ID, Bacteria KLEBSIELLA OXYTOCA  Final      Susceptibility   Klebsiella oxytoca - MIC*    AMPICILLIN >=32 RESISTANT Resistant     CEFTAZIDIME <=1 SENSITIVE Sensitive     CEFAZOLIN >=64 RESISTANT Resistant  CEFTRIAXONE <=1 SENSITIVE Sensitive     CIPROFLOXACIN <=0.25 SENSITIVE Sensitive     GENTAMICIN <=1 SENSITIVE Sensitive     IMIPENEM <=0.25 SENSITIVE Sensitive     TRIMETH/SULFA <=20 SENSITIVE Sensitive     CEFOXITIN <=4 SENSITIVE Sensitive     PIP/TAZO Value in next row Resistant      RESISTANT128    * KLEBSIELLA OXYTOCA  Urine culture     Status: None   Collection Time: 11/29/14 11:22 AM  Result Value Ref Range Status   Specimen Description URINE, CLEAN CATCH  Final   Special Requests NONE  Final   Culture >=100,000 COLONIES/mL KLEBSIELLA OXYTOCA  Final   Report Status 12/01/2014 FINAL  Final   Organism ID, Bacteria KLEBSIELLA OXYTOCA  Final      Susceptibility   Klebsiella oxytoca - MIC*    AMPICILLIN >=32 RESISTANT Resistant     CEFAZOLIN >=64 RESISTANT Resistant     CEFTRIAXONE 4 SENSITIVE Sensitive     CIPROFLOXACIN <=0.25 SENSITIVE Sensitive     GENTAMICIN <=1 SENSITIVE Sensitive     IMIPENEM <=0.25 SENSITIVE Sensitive     NITROFURANTOIN <=16 SENSITIVE Sensitive     TRIMETH/SULFA <=20 SENSITIVE Sensitive     CEFOXITIN <=4 SENSITIVE Sensitive     * >=100,000 COLONIES/mL KLEBSIELLA OXYTOCA  MRSA PCR Screening     Status: None   Collection Time: 11/29/14 12:13 PM  Result Value Ref Range Status   MRSA by PCR  NEGATIVE NEGATIVE Final    Comment:        The GeneXpert MRSA Assay (FDA approved for NASAL specimens only), is one component of a comprehensive MRSA colonization surveillance program. It is not intended to diagnose MRSA infection nor to guide or monitor treatment for MRSA infections.   Culture, blood (routine x 2)     Status: None   Collection Time: 11/29/14  1:19 PM  Result Value Ref Range Status   Specimen Description BLOOD  Final   Special Requests Immunocompromised  Final   Culture NO GROWTH 5 DAYS  Final   Report Status 12/04/2014 FINAL  Final  Urine culture     Status: None   Collection Time: 11/30/14 10:12 AM  Result Value Ref Range Status   Specimen Description URINE, CATHETERIZED  Final   Special Requests Immunocompromised  Final   Culture NO GROWTH 2 DAYS  Final   Report Status 12/02/2014 FINAL  Final  Culture, blood (routine x 2)     Status: None   Collection Time: 12/06/14  8:08 PM  Result Value Ref Range Status   Specimen Description BLOOD  Final   Special Requests NONE  Final   Culture  Setup Time   Final    GRAM POSITIVE COCCI IN BOTH AEROBIC AND ANAEROBIC BOTTLES CRITICAL RESULT CALLED TO, READ BACK BY AND VERIFIED WITH: JENNIFER BEZARD AT 2774 12/08/14.PMH CONFIRMED BY RWW    Culture   Final    STAPHYLOCOCCUS AURICULARIS IN BOTH AEROBIC AND ANAEROBIC BOTTLES    Report Status 12/11/2014 FINAL  Final   Organism ID, Bacteria STAPHYLOCOCCUS AURICULARIS  Final      Susceptibility   Staphylococcus auricularis - MIC*    CIPROFLOXACIN >=8 RESISTANT Resistant     ERYTHROMYCIN >=8 RESISTANT Resistant     GENTAMICIN <=0.5 SENSITIVE Sensitive     OXACILLIN >=4 RESISTANT Resistant     TETRACYCLINE <=1 SENSITIVE Sensitive     VANCOMYCIN 1 SENSITIVE Sensitive     CLINDAMYCIN <=0.25 SENSITIVE Sensitive  TRIMETH/SULFA Value in next row Sensitive      SENSITIVE<=20    LEVOFLOXACIN Value in next row Intermediate      INTERMEDIATE4    * STAPHYLOCOCCUS  AURICULARIS  Culture, blood (routine x 2)     Status: None   Collection Time: 12/06/14  8:40 PM  Result Value Ref Range Status   Specimen Description BLOOD  Final   Special Requests NONE  Final   Culture NO GROWTH 5 DAYS  Final   Report Status 12/11/2014 FINAL  Final  Culture, blood (routine x 2)     Status: None   Collection Time: 12/08/14 11:40 AM  Result Value Ref Range Status   Specimen Description BLOOD  Final   Special Requests Normal  Final   Culture NO GROWTH 6 DAYS  Final   Report Status 12/14/2014 FINAL  Final  Culture, blood (routine x 2)     Status: None   Collection Time: 12/08/14 11:49 AM  Result Value Ref Range Status   Specimen Description BLOOD  Final   Special Requests Normal  Final   Culture NO GROWTH 6 DAYS  Final   Report Status 12/14/2014 FINAL  Final  C difficile quick scan w PCR reflex ([facility] only)     Status: None   Collection Time: 12/14/14 10:11 AM  Result Value Ref Range Status   C Diff antigen NEGATIVE  Final   C Diff toxin NEGATIVE  Final   C Diff interpretation Negative for C. difficile  Final    Medications:  Scheduled:  . antiseptic oral rinse  7 mL Mouth Rinse BID  . insulin aspart  1-3 Units Subcutaneous Q6H  . methylPREDNISolone (SOLU-MEDROL) injection  60 mg Intravenous Once  . mometasone-formoterol  2 puff Inhalation BID  . pantoprazole (PROTONIX) IV  40 mg Intravenous Q12H  . potassium chloride  40 mEq Oral BID  . potassium chloride  10 mEq Intravenous Q1 Hr x 5  . scopolamine  1 patch Transdermal Q72H  . thiamine IV  100 mg Intravenous Daily  . ziprasidone  10 mg Intramuscular QHS   Infusions:  . TPN (CLINIMIX) Adult without lytes 70 mL/hr at 12/15/14 1841  . TPN (CLINIMIX) Adult without lytes      Assessment: 69 yo male with progressive mantle cell lymphoma. Patient being treated for electrolyte imbalance and AMS. Patient currently on vancomycin for Staph auricularis bacteremia.   Goal of Therapy:  Resolution of  Symptoms  Plan:   1. TPN/Electrolytes: Patient now receiving Clinimix without electrolytes at 77mL/hr. Will order potassium 40 mEq IV x 1 and increase oral replacement to 40 mEq BID. Will obtain follow-up potassium at 1400. Will follow-up all other electrolytes with am labs. Will need to replace electrolytes outside of TPN and will need to be mindful of patients fluid status when replacing electrolytes.    Mg 1.8, order magnesium sulfate 1gm x 1. Potassium follow up 3.2 this afternoon, additional 91mEq ordered by MD. No additional supplementation at this time, will recheck electrolytes with AM labs and continue to supplement outside of TPN.   2. Constipation: Patient currently with rectal tube and ilues. Patient removed NG tube and per MD will not be replaced. Will leave PO orders active incase Patient currently ordered lactulose 55mL BID by neuro. Will discontinue senna. Will continue to monitor.    3. Vancomycin (day 11): Trough 31. Patient experiencing renal failure and may require dialysis. Patient last received vancomycin 750mg  IV x1 on 7/3. Patient has follow-up trough  with am labs on 7/5. Goal trough is 15-20. Patient to be on vancomycin through 7/9.    Pharmacy will continue to monitor and adjust per consult.    Rexene Edison, PharmD Clinical Pharmacist  12/16/2014 3:33 PM

## 2014-12-16 NOTE — Clinical Social Work Note (Signed)
Clinical Social Work Assessment  Patient Details  Name: Caleb Francis MRN: [id number] Date of Birth: 01-21-1946  Date of referral:  12/02/14               Reason for consult:  Facility Placement (return to [facility] once medically stable)                Permission sought to share information with:   (pt was not responsive) Permission granted to share information::     Name::        Agency::     Relationship::     Contact Information:     Housing/Transportation Living arrangements for the past 2 months:  [place] of Information:  Spouse, Adult Children Patient Interpreter Needed:  None Criminal Activity/Legal Involvement Pertinent to Current Situation/Hospitalization:  No - Comment as needed Significant Relationships:    Lives with:  Spouse Do you feel safe going back to the place where you live?  Yes Need for family participation in patient care:  Yes (Comment)  Care giving concerns:  Pt is currently not verbally responsive but did look at the CSW when she spoke to him.     Social Worker assessment / plan:  CSW spoke to pt's daughter Caleb Francis [phone].  She was in the room at the time of assessment.  Per the pt's daughter pt had attempted to pull out some tubing the previous night.  Pt also has medical mittens on his hand to prevent him from pulling on tubes.  CSW is currently waiting on a response from [facility] to confirm that he can return for rehab.  Per pt's daughter pt as previously independent before going to [facility] for the first time a few weeks ago.  CSW also called [facility] in [place] to see if pt would be eligible for Medicaid supplement.  CSW will continue to follow.    Employment status:  Retired Nurse, adult PT Recommendations:  Not assessed at this time Information / Referral to community resources:  [place]  Patient/Family's Response to care:  pt's daughter was in  agreement with DC to SNF.    Patient/Family's Understanding of and Emotional Response to Diagnosis, Current Treatment, and Prognosis:  Pt verbalized her understanding of DC plan and was in agreement with returning to [facility] for pt to finish rehab.  Emotional Assessment Appearance:  Appears stated age Attitude/Demeanor/Rapport:   (pleasant and cooperative) Affect (typically observed):    Orientation:    Alcohol / Substance use:  Not Applicable Psych involvement (Current and /or in the community):  No (Comment)  Discharge Needs  Concerns to be addressed:   (patient at [facility] for STR) Readmission within the last 30 days:  No Current discharge risk:  None Barriers to Discharge:  No Barriers Identified   Mathews Argyle, LCSW 12/16/2014, 2:34 PM

## 2014-12-16 NOTE — Progress Notes (Addendum)
[place] NOTE  Pharmacy Consult for Vancomycin, TPN Electrolyte Management and Constipation Management Indication: ICU Status   No Known Allergies  Patient Measurements: Height: 6' (182.9 cm) Weight: 140 lb (63.504 kg) IBW/kg (Calculated) : 77.6   Vital Signs: Temp: 98.1 F (36.7 C) (07/04 0549) Temp Source: Oral (07/04 0549) BP: 108/53 mmHg (07/04 0549) Pulse Rate: 104 (07/04 0549) Intake/Output from previous day: 07/03 0701 - 07/04 0700 In: 1298 [IV Piggyback:500; TPN:798] Out: 3100 [HDQQI:2979; Emesis/NG output:750; Stool:400] Intake/Output from this shift:   Vent settings for last 24 hours:    Labs:  Recent Labs  12/14/14 0502  12/14/14 1955 12/15/14 0529 12/15/14 1508 12/16/14 0524  WBC 10.0  --   --  9.4  --  9.4  HGB 7.3*  --  8.0* 7.5*  --  7.1*  HCT 22.4*  --  23.4* 22.6*  --  22.1*  PLT 36*  --   --  43*  --  59*  CREATININE 4.91*  < >  --  5.50* 5.74* 6.00*  MG 2.3  --   --   --   --   --   PHOS 5.9*  --   --   --   --   --   ALBUMIN 2.1*  --   --   --   --   --   PROT 5.3*  --   --   --   --   --   AST 12*  --   --   --   --   --   ALT 9*  --   --   --   --   --   ALKPHOS 114  --   --   --   --   --   BILITOT 0.6  --   --   --   --   --   < > = values in this interval not displayed. Estimated Creatinine Clearance: 10.7 mL/min (by C-G formula based on Cr of 6). BMP Latest Ref Rng 12/16/2014 12/15/2014 12/15/2014  Glucose 65 - 99 mg/dL 145(H) 138(H) 148(H)  BUN 6 - 20 mg/dL 89(H) 99(H) 92(H)  Creatinine 0.61 - 1.24 mg/dL 6.00(H) 5.74(H) 5.50(H)  Sodium 135 - 145 mmol/L 144 142 143  Potassium 3.5 - 5.1 mmol/L 3.0(L) 3.3(L) 3.1(L)  Chloride 101 - 111 mmol/L 116(H) 114(H) 112(H)  CO2 22 - 32 mmol/L 19(L) 20(L) 20(L)  Calcium 8.9 - 10.3 mg/dL 8.5(L) 8.2(L) 8.0(L)     Recent Labs  12/15/14 1950 12/15/14 2349 12/16/14 0546  GLUCAP 131* 158* 141*    Microbiology: Recent Results (from the past 720 hour(s))  Culture, blood  (single)     Status: None   Collection Time: 11/29/14 11:09 AM  Result Value Ref Range Status   Specimen Description BLOOD  Final   Special Requests NONE  Final   Culture  Setup Time   Final    GRAM NEGATIVE RODS AEROBIC BOTTLE ONLY CRITICAL RESULT CALLED TO, READ BACK BY AND VERIFIED WITH: SANDRA VORBA ON 11/30/14 AT 0825 BY JEF    Culture KLEBSIELLA OXYTOCA AEROBIC BOTTLE ONLY   Final   Report Status 12/04/2014 FINAL  Final   Organism ID, Bacteria KLEBSIELLA OXYTOCA  Final      Susceptibility   Klebsiella oxytoca - MIC*    AMPICILLIN >=32 RESISTANT Resistant     CEFTAZIDIME <=1 SENSITIVE Sensitive     CEFAZOLIN >=64 RESISTANT Resistant     CEFTRIAXONE <=  1 SENSITIVE Sensitive     CIPROFLOXACIN <=0.25 SENSITIVE Sensitive     GENTAMICIN <=1 SENSITIVE Sensitive     IMIPENEM <=0.25 SENSITIVE Sensitive     TRIMETH/SULFA <=20 SENSITIVE Sensitive     CEFOXITIN <=4 SENSITIVE Sensitive     PIP/TAZO Value in next row Resistant      RESISTANT128    * KLEBSIELLA OXYTOCA  Urine culture     Status: None   Collection Time: 11/29/14 11:22 AM  Result Value Ref Range Status   Specimen Description URINE, CLEAN CATCH  Final   Special Requests NONE  Final   Culture >=100,000 COLONIES/mL KLEBSIELLA OXYTOCA  Final   Report Status 12/01/2014 FINAL  Final   Organism ID, Bacteria KLEBSIELLA OXYTOCA  Final      Susceptibility   Klebsiella oxytoca - MIC*    AMPICILLIN >=32 RESISTANT Resistant     CEFAZOLIN >=64 RESISTANT Resistant     CEFTRIAXONE 4 SENSITIVE Sensitive     CIPROFLOXACIN <=0.25 SENSITIVE Sensitive     GENTAMICIN <=1 SENSITIVE Sensitive     IMIPENEM <=0.25 SENSITIVE Sensitive     NITROFURANTOIN <=16 SENSITIVE Sensitive     TRIMETH/SULFA <=20 SENSITIVE Sensitive     CEFOXITIN <=4 SENSITIVE Sensitive     * >=100,000 COLONIES/mL KLEBSIELLA OXYTOCA  MRSA PCR Screening     Status: None   Collection Time: 11/29/14 12:13 PM  Result Value Ref Range Status   MRSA by PCR NEGATIVE NEGATIVE  Final    Comment:        The GeneXpert MRSA Assay (FDA approved for NASAL specimens only), is one component of a comprehensive MRSA colonization surveillance program. It is not intended to diagnose MRSA infection nor to guide or monitor treatment for MRSA infections.   Culture, blood (routine x 2)     Status: None   Collection Time: 11/29/14  1:19 PM  Result Value Ref Range Status   Specimen Description BLOOD  Final   Special Requests Immunocompromised  Final   Culture NO GROWTH 5 DAYS  Final   Report Status 12/04/2014 FINAL  Final  Urine culture     Status: None   Collection Time: 11/30/14 10:12 AM  Result Value Ref Range Status   Specimen Description URINE, CATHETERIZED  Final   Special Requests Immunocompromised  Final   Culture NO GROWTH 2 DAYS  Final   Report Status 12/02/2014 FINAL  Final  Culture, blood (routine x 2)     Status: None   Collection Time: 12/06/14  8:08 PM  Result Value Ref Range Status   Specimen Description BLOOD  Final   Special Requests NONE  Final   Culture  Setup Time   Final    GRAM POSITIVE COCCI IN BOTH AEROBIC AND ANAEROBIC BOTTLES CRITICAL RESULT CALLED TO, READ BACK BY AND VERIFIED WITH: JENNIFER BEZARD AT 6962 12/08/14.PMH CONFIRMED BY RWW    Culture   Final    STAPHYLOCOCCUS AURICULARIS IN BOTH AEROBIC AND ANAEROBIC BOTTLES    Report Status 12/11/2014 FINAL  Final   Organism ID, Bacteria STAPHYLOCOCCUS AURICULARIS  Final      Susceptibility   Staphylococcus auricularis - MIC*    CIPROFLOXACIN >=8 RESISTANT Resistant     ERYTHROMYCIN >=8 RESISTANT Resistant     GENTAMICIN <=0.5 SENSITIVE Sensitive     OXACILLIN >=4 RESISTANT Resistant     TETRACYCLINE <=1 SENSITIVE Sensitive     VANCOMYCIN 1 SENSITIVE Sensitive     CLINDAMYCIN <=0.25 SENSITIVE Sensitive     TRIMETH/SULFA  Value in next row Sensitive      SENSITIVE<=20    LEVOFLOXACIN Value in next row Intermediate      INTERMEDIATE4    * STAPHYLOCOCCUS AURICULARIS  Culture,  blood (routine x 2)     Status: None   Collection Time: 12/06/14  8:40 PM  Result Value Ref Range Status   Specimen Description BLOOD  Final   Special Requests NONE  Final   Culture NO GROWTH 5 DAYS  Final   Report Status 12/11/2014 FINAL  Final  Culture, blood (routine x 2)     Status: None   Collection Time: 12/08/14 11:40 AM  Result Value Ref Range Status   Specimen Description BLOOD  Final   Special Requests Normal  Final   Culture NO GROWTH 6 DAYS  Final   Report Status 12/14/2014 FINAL  Final  Culture, blood (routine x 2)     Status: None   Collection Time: 12/08/14 11:49 AM  Result Value Ref Range Status   Specimen Description BLOOD  Final   Special Requests Normal  Final   Culture NO GROWTH 6 DAYS  Final   Report Status 12/14/2014 FINAL  Final  C difficile quick scan w PCR reflex ([facility] only)     Status: None   Collection Time: 12/14/14 10:11 AM  Result Value Ref Range Status   C Diff antigen NEGATIVE  Final   C Diff toxin NEGATIVE  Final   C Diff interpretation Negative for C. difficile  Final    Medications:  Scheduled:  . antiseptic oral rinse  7 mL Mouth Rinse BID  . bisacodyl  10 mg Rectal Daily  . insulin aspart  1-3 Units Subcutaneous Q6H  . lactulose  30 g Oral BID  . methylPREDNISolone (SOLU-MEDROL) injection  60 mg Intravenous Once  . mometasone-formoterol  2 puff Inhalation BID  . pantoprazole (PROTONIX) IV  40 mg Intravenous Q12H  . potassium chloride  40 mEq Oral BID  . small volume/piggyback builder   Intravenous Once  . scopolamine  1 patch Transdermal Q72H  . sennosides  10 mL Oral BID  . thiamine IV  100 mg Intravenous Daily  . ziprasidone  10 mg Intramuscular QHS   Infusions:  . 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 Patient being treated for electrolyte imbalance and AMS. Patient currently on vancomycin for Staph auricularis bacteremia.   Goal of  Therapy:  Resolution of Symptoms  Plan:   1. TPN/Electrolytes: Patient now receiving Clinimix without electrolytes at 55mL/hr. Patient received an additional 25mEq of potassium on 7/3.Will add on magnesium and phosphorus labs. Will order potassium 40 mEq IV x 1 and increase oral replacement to 40 mEq BID. Will obtain follow-up potassium at 1400. Will follow-up all other electrolytes with am labs. Will need to replace electrolytes outside of TPN and will need to be mindful of patients fluid status when replacing electrolytes.    2. Constipation: Patient currently with rectal tube and ilues. Patient removed NG tube and per MD will not be replaced. Will leave PO orders active [facility] Patient currently ordered lactulose 73mL BID by neuro. Will discontinue senna. Will continue to monitor.    3. Vancomycin (day 11): Trough 31. Patient experiencing renal failure and may require dialysis. Patient last received vancomycin 750mg  IV x1 on 7/3. Patient has follow-up trough with am labs on 7/5. Goal trough is 15-20. Patient to be on vancomycin  through 7/9.    Pharmacy will continue to monitor and adjust per consult.    Currie Paris Clinical Pharmacist 12/16/2014

## 2014-12-16 NOTE — Progress Notes (Signed)
Subjective:  UOP 1.9 liters over the past 24 hours. Cr up but BUN down a bit.    Objective:  Vital signs in last 24 hours:  Temp:  [97.6 F (36.4 C)-98.4 F (36.9 C)] 98.4 F (36.9 C) (07/04 0840) Pulse Rate:  [99-108] 108 (07/04 0840) Resp:  [12-22] 12 (07/04 0840) BP: (94-120)/(53-70) 120/56 mmHg (07/04 0840) SpO2:  [99 %-100 %] 100 % (07/04 0840) Weight:  [63.504 kg (140 lb)] 63.504 kg (140 lb) (07/04 0549)  Weight change: -5.396 kg (-11 lb 14.4 oz) Filed Weights   12/14/14 0500 12/15/14 0500 12/16/14 0549  Weight: 69.1 kg (152 lb 5.4 oz) 68.9 kg (151 lb 14.4 oz) 63.504 kg (140 lb)    Intake/Output: I/O last 3 completed shifts: In: 2718 [Blood:280; IV Piggyback:650] Out: 94 [Urine:2650; Emesis/NG output:850; PZWCH:8527]   Intake/Output this shift:     Physical Exam: General: awake, ill appearing  Head: NG in place  Eyes: Anicteric  Neck: Supple, trachea midline  Lungs:  Clear to auscultation normal effort  Heart: S1S2 no rubs  Abdomen:  distension, slightly tender, decreased bowel sounds  Extremities: Trace LE edema  Neurologic: Currently awake, not consistently following commands  Skin: No acute rashes  Access:     Basic Metabolic Panel:  Recent Labs Lab 12/10/14 0500  12/11/14 0537  12/12/14 0535  12/13/14 0448  12/14/14 0502 12/14/14 1620 12/15/14 0529 12/15/14 1508 12/16/14 0524  NA 149*  --  145  --  143  --  145  --  143 SEE COMMENTS 143 142 144  K 3.2*  < > 3.2*  < > 3.2*  < > 3.4*  < > 3.1* 3.5 3.1* 3.3* 3.0*  CL 120*  --  115*  --  112*  --  111  --  112* 110 112* 114* 116*  CO2 24  --  24  --  24  --  23  --  24 22 20* 20* 19*  GLUCOSE 132*  --  161*  --  177*  --  192*  --  158* 182* 148* 138* 145*  BUN 41*  --  49*  --  60*  --  64*  --  78* 83* 92* 99* 89*  CREATININE 1.92*  --  2.85*  --  3.73*  < > 4.26*  --  4.91* 5.34* 5.50* 5.74* 6.00*  CALCIUM 8.6*  --  7.8*  --  7.8*  --  8.7*  --  8.0* 8.0* 8.0* 8.2* 8.5*  MG 1.9  --  2.0   --  2.1  --  2.2  --  2.3  --   --   --  1.8  PHOS 3.1  --   --   --  5.0*  --  6.2*  --  5.9*  --   --   --  4.5  < > = values in this interval not displayed.  Liver Function Tests:  Recent Labs Lab 12/12/14 0535 12/14/14 0502  AST 13* 12*  ALT 9* 9*  ALKPHOS 112 114  BILITOT 0.7 0.6  PROT 5.2* 5.3*  ALBUMIN 2.1* 2.1*   No results for input(s): LIPASE, AMYLASE in the last 168 hours. No results for input(s): AMMONIA in the last 168 hours.  CBC:  Recent Labs Lab 12/12/14 0535 12/13/14 0448 12/14/14 0502 12/14/14 1955 12/15/14 0529 12/16/14 0524  WBC 6.4 11.1* 10.0  --  9.4 9.4  NEUTROABS 5.0 7.9* 8.0*  --  8.4* 7.6  HGB 7.9*  8.2* 7.3* 8.0* 7.5* 7.1*  HCT 23.8* 25.1* 22.4* 23.4* 22.6* 22.1*  MCV 87.7 87.7 87.7  --  84.7 85.9  PLT 16* 36* 36*  --  43* 59*    Cardiac Enzymes: No results for input(s): CKTOTAL, CKMB, CKMBINDEX, TROPONINI in the last 168 hours.  BNP: Invalid input(s): POCBNP  CBG:  Recent Labs Lab 12/15/14 1117 12/15/14 1730 12/15/14 1950 12/15/14 2349 12/16/14 0546  GLUCAP 139* 119* 131* 158* 141*    Microbiology: Results for orders placed or performed during the hospital encounter of 11/29/14  MRSA PCR Screening     Status: None   Collection Time: 11/29/14 12:13 PM  Result Value Ref Range Status   MRSA by PCR NEGATIVE NEGATIVE Final    Comment:        The GeneXpert MRSA Assay (FDA approved for NASAL specimens only), is one component of a comprehensive MRSA colonization surveillance program. It is not intended to diagnose MRSA infection nor to guide or monitor treatment for MRSA infections.   Culture, blood (routine x 2)     Status: None   Collection Time: 11/29/14  1:19 PM  Result Value Ref Range Status   Specimen Description BLOOD  Final   Special Requests Immunocompromised  Final   Culture NO GROWTH 5 DAYS  Final   Report Status 12/04/2014 FINAL  Final  Urine culture     Status: None   Collection Time: 11/30/14 10:12 AM   Result Value Ref Range Status   Specimen Description URINE, CATHETERIZED  Final   Special Requests Immunocompromised  Final   Culture NO GROWTH 2 DAYS  Final   Report Status 12/02/2014 FINAL  Final  Culture, blood (routine x 2)     Status: None   Collection Time: 12/06/14  8:08 PM  Result Value Ref Range Status   Specimen Description BLOOD  Final   Special Requests NONE  Final   Culture  Setup Time   Final    GRAM POSITIVE COCCI IN BOTH AEROBIC AND ANAEROBIC BOTTLES CRITICAL RESULT CALLED TO, READ BACK BY AND VERIFIED WITH: JENNIFER BEZARD AT 1027 12/08/14.PMH CONFIRMED BY RWW    Culture   Final    STAPHYLOCOCCUS AURICULARIS IN BOTH AEROBIC AND ANAEROBIC BOTTLES    Report Status 12/11/2014 FINAL  Final   Organism ID, Bacteria STAPHYLOCOCCUS AURICULARIS  Final      Susceptibility   Staphylococcus auricularis - MIC*    CIPROFLOXACIN >=8 RESISTANT Resistant     ERYTHROMYCIN >=8 RESISTANT Resistant     GENTAMICIN <=0.5 SENSITIVE Sensitive     OXACILLIN >=4 RESISTANT Resistant     TETRACYCLINE <=1 SENSITIVE Sensitive     VANCOMYCIN 1 SENSITIVE Sensitive     CLINDAMYCIN <=0.25 SENSITIVE Sensitive     TRIMETH/SULFA Value in next row Sensitive      SENSITIVE<=20    LEVOFLOXACIN Value in next row Intermediate      INTERMEDIATE4    * STAPHYLOCOCCUS AURICULARIS  Culture, blood (routine x 2)     Status: None   Collection Time: 12/06/14  8:40 PM  Result Value Ref Range Status   Specimen Description BLOOD  Final   Special Requests NONE  Final   Culture NO GROWTH 5 DAYS  Final   Report Status 12/11/2014 FINAL  Final  Culture, blood (routine x 2)     Status: None   Collection Time: 12/08/14 11:40 AM  Result Value Ref Range Status   Specimen Description BLOOD  Final   Special Requests Normal  Final   Culture NO GROWTH 6 DAYS  Final   Report Status 12/14/2014 FINAL  Final  Culture, blood (routine x 2)     Status: None   Collection Time: 12/08/14 11:49 AM  Result Value Ref Range  Status   Specimen Description BLOOD  Final   Special Requests Normal  Final   Culture NO GROWTH 6 DAYS  Final   Report Status 12/14/2014 FINAL  Final  C difficile quick scan w PCR reflex ([facility] only)     Status: None   Collection Time: 12/14/14 10:11 AM  Result Value Ref Range Status   C Diff antigen NEGATIVE  Final   C Diff toxin NEGATIVE  Final   C Diff interpretation Negative for C. difficile  Final    Coagulation Studies: No results for input(s): LABPROT, INR in the last 72 hours.  Urinalysis: No results for input(s): COLORURINE, LABSPEC, PHURINE, GLUCOSEU, HGBUR, BILIRUBINUR, KETONESUR, PROTEINUR, UROBILINOGEN, NITRITE, LEUKOCYTESUR in the last 72 hours.  Invalid input(s): APPERANCEUR    Imaging: Dg Abd 2 Views  12/16/2014   CLINICAL DATA:  Followup abdominal distension.  EXAM: ABDOMEN - 2 VIEW  COMPARISON:  Radiographs 12/15/2014  FINDINGS: Improved bowel gas pattern with less distended bowel. No free air. The left double-J ureteral stent is stable. The NG tube is stable.  IMPRESSION: Improved bowel gas pattern with less distended air-filled bowel.   Electronically Signed   By: Marijo Sanes M.D.   On: 12/16/2014 09:57   Dg Abd 2 Views  12/15/2014   CLINICAL DATA:  Pseudo obstruction of the colon  EXAM: ABDOMEN - 2 VIEW  COMPARISON:  12/14/2014  FINDINGS: NG tube is in the stomach. There is a left ureteral stent remaining in place. Increasing dilatation of small bowel loops throughout the abdomen and upper pelvis. Continued mild gaseous distention of the cecum. No free air organomegaly.  IMPRESSION: Increasing gaseous distention of small bowel loops, now moderately dilated.   Electronically Signed   By: Rolm Baptise M.D.   On: 12/15/2014 10:39     Medications:   . TPN (CLINIMIX) Adult without lytes 70 mL/hr at 12/15/14 1841   . antiseptic oral rinse  7 mL Mouth Rinse BID  . insulin aspart  1-3 Units Subcutaneous Q6H  . magnesium sulfate 1 - 4 g bolus IVPB  1 g Intravenous Once   . methylPREDNISolone (SOLU-MEDROL) injection  60 mg Intravenous Once  . mometasone-formoterol  2 puff Inhalation BID  . pantoprazole (PROTONIX) IV  40 mg Intravenous Q12H  . potassium chloride  40 mEq Oral BID  . small volume/piggyback builder   Intravenous Once  . scopolamine  1 patch Transdermal Q72H  . thiamine IV  100 mg Intravenous Daily  . ziprasidone  10 mg Intramuscular QHS   acetaminophen **OR** acetaminophen, albuterol, bethanechol, haloperidol lactate, heparin lock flush, heparin lock flush, magnesium hydroxide, metoprolol, morphine injection, ondansetron (ZOFRAN) IV, promethazine, sodium chloride, sodium chloride, sodium chloride, vancomycin, ziprasidone  Assessment/ Plan:  35 AAM with progressive mantle cell lymphoma, [facility] chemo in 11/2014, hx left sided hydronephrosis and hydroureter. S/p ureteral stent placement    1.  Acute renal failure due to ATN. 2.  Sepsis with klebsiella oxytoca. 3.  Hypernatremia, resolved 4.  Hypokalemia. 5.  Anemia unspecified D64.9  Plan:   BUN down, but Cr up today. Good UOP noted however. Discussed the potential of renal replacement therapy with son. He will discuss this further with his sister. Explained that dialysis wouldn't resolve his underlying  issues. Will replete low K again today. Overall pt appears to have poor prognosis.   LOS: Smyth, Cherrise Occhipinti 7/4/201611:42 AM

## 2014-12-17 ENCOUNTER — Other Ambulatory Visit (HOSPITAL_COMMUNITY): Payer: Self-pay

## 2014-12-17 DIAGNOSIS — K598 Other specified functional intestinal disorders: Secondary | ICD-10-CM

## 2014-12-17 DIAGNOSIS — K5981 Ogilvie syndrome: Secondary | ICD-10-CM | POA: Insufficient documentation

## 2014-12-17 DIAGNOSIS — K5669 Other intestinal obstruction: Secondary | ICD-10-CM

## 2014-12-17 LAB — BASIC METABOLIC PANEL
Anion gap: 4 — ABNORMAL LOW (ref 5–15)
BUN: 103 mg/dL — ABNORMAL HIGH (ref 6–20)
CALCIUM: 8.5 mg/dL — AB (ref 8.9–10.3)
CO2: 16 mmol/L — ABNORMAL LOW (ref 22–32)
Chloride: 123 mmol/L — ABNORMAL HIGH (ref 101–111)
Creatinine, Ser: 6.31 mg/dL — ABNORMAL HIGH (ref 0.61–1.24)
GFR, EST AFRICAN AMERICAN: 9 mL/min — AB (ref >60)
GFR, EST NON AFRICAN AMERICAN: 8 mL/min — AB (ref >60)
Glucose, Bld: 157 mg/dL — ABNORMAL HIGH (ref 65–99)
Potassium: 3.8 mmol/L (ref 3.5–5.1)
Sodium: 143 mmol/L (ref 135–145)

## 2014-12-17 LAB — CBC
HCT: 21.5 % — ABNORMAL LOW (ref 40.0–52.0)
HEMATOCRIT: 21.7 % — AB (ref 40.0–52.0)
HEMOGLOBIN: 6.9 g/dL — AB (ref 13.0–18.0)
Hemoglobin: 7 g/dL — ABNORMAL LOW (ref 13.0–18.0)
MCH: 27.7 pg (ref 26.0–34.0)
MCH: 28 pg (ref 26.0–34.0)
MCHC: 31.9 g/dL — AB (ref 32.0–36.0)
MCHC: 32.1 g/dL (ref 32.0–36.0)
MCV: 86.8 fL (ref 80.0–100.0)
MCV: 87.1 fL (ref 80.0–100.0)
Platelets: 72 10*3/uL — ABNORMAL LOW (ref 150–440)
Platelets: 86 10*3/uL — ABNORMAL LOW (ref 150–440)
RBC: 2.48 MIL/uL — ABNORMAL LOW (ref 4.40–5.90)
RBC: 2.49 MIL/uL — AB (ref 4.40–5.90)
RDW: 20 % — AB (ref 11.5–14.5)
RDW: 19.3 % — ABNORMAL HIGH (ref 11.5–14.5)
WBC: 7.7 10*3/uL (ref 3.8–10.6)
WBC: 8.3 10*3/uL (ref 3.8–10.6)

## 2014-12-17 LAB — GLUCOSE, CAPILLARY
GLUCOSE-CAPILLARY: 141 mg/dL — AB (ref 65–99)
GLUCOSE-CAPILLARY: 144 mg/dL — AB (ref 65–99)
Glucose-Capillary: 146 mg/dL — ABNORMAL HIGH (ref 65–99)
Glucose-Capillary: 155 mg/dL — ABNORMAL HIGH (ref 65–99)

## 2014-12-17 LAB — MAGNESIUM: Magnesium: 1.9 mg/dL (ref 1.7–2.4)

## 2014-12-17 LAB — VANCOMYCIN, RANDOM: VANCOMYCIN RM: 21 ug/mL

## 2014-12-17 LAB — VANCOMYCIN, TROUGH: Vancomycin Tr: 26 ug/mL (ref 10–20)

## 2014-12-17 LAB — PHOSPHORUS: Phosphorus: 3.7 mg/dL (ref 2.5–4.6)

## 2014-12-17 LAB — TSH: TSH: 0.698 u[IU]/mL (ref 0.350–4.500)

## 2014-12-17 MED ORDER — TRACE MINERALS CR-CU-MN-SE-ZN 10-1000-500-60 MCG/ML IV SOLN
INTRAVENOUS | Status: AC
  Administered 2014-12-17: 18:00:00 via INTRAVENOUS
  Filled 2014-12-17: qty 1680

## 2014-12-17 NOTE — Op Note (Signed)
  OPERATIVE NOTE   PROCEDURE: 1. Ultrasound guidance for vascular access right femoral vein 2. Placement of a 30 cm triple lumen dialysis catheter right femoral vein vein  PRE-OPERATIVE DIAGNOSIS: 1. Acute renal failure 2. lymphoma  POST-OPERATIVE DIAGNOSIS: Same  SURGEON: Leotis Pain, MD  ASSISTANT(S): None  ANESTHESIA: local  ESTIMATED BLOOD LOSS: Minimal   FINDING(S): 1. None  SPECIMEN(S): None  INDICATIONS:  Patient is a 69 yo AAM who presents with  AMS and ARF.  He needs dialysis at this time, and we are asked to place a catheter for dialysis.  Risks and benefits were discussed, and informed consent was obtained..  DESCRIPTION: After obtaining full informed written consent, the patient was laid flat in the bed. The right groin was sterilely prepped and draped in a sterile surgical field was created. The right femoral vein was visualized with ultrasound and found to be widely patent. It was then accessed under direct guidance without difficulty with a Seldinger needle and a permanent image was recorded. A J-wire was then placed. After skin nick and dilatation, a 30 cm Trialysis dialysis catheter was placed over the wire and the wire was removed. The lumens withdrew dark red nonpulsatile blood and flushed easily with sterile saline. The catheter was secured to the skin with 3 nylon sutures. Sterile dressing was placed.  COMPLICATIONS: None  CONDITION: Stable  Toccara Alford 12/17/2014 7:15 PM

## 2014-12-17 NOTE — Progress Notes (Signed)
[facility] Hematology/Oncology Progress Note  Date of admission: 11/29/2014  Hospital day:  12/17/2014  Chief Complaint: Caleb Francis is a 69 y.o. 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  Subjective:  More alert.  Words difficult to comprehend.  States name.  Says he is in [place].  Follows limited commands.  Unaware of dialysis issues.  Denies pain.    Social History: The patient is alone today.  Allergies: No Known Allergies  Scheduled Medications: . antiseptic oral rinse  7 mL Mouth Rinse BID  . insulin aspart  1-3 Units Subcutaneous Q6H  . methylPREDNISolone (SOLU-MEDROL) injection  60 mg Intravenous Once  . mometasone-formoterol  2 puff Inhalation BID  . pantoprazole (PROTONIX) IV  40 mg Intravenous Q12H  . potassium chloride  40 mEq Oral BID  . scopolamine  1 patch Transdermal Q72H  . thiamine IV  100 mg Intravenous Daily  . ziprasidone  10 mg Intramuscular QHS   Review of Systems: GENERAL: Minimally interactive. Denies pain.  No fever. PERFORMANCE STATUS (ECOG): 4  Physical Exam: Blood pressure 100/55, pulse 126, temperature 98.2 F (36.8 C), temperature source Oral, resp. rate 20, height 6' (1.829 m), weight 138 lb 9.6 oz (62.869 kg), SpO2 100 %.  GENERAL: Chronically ill appearing gentleman lying in bed on the telemetry unit. MENTAL STATUS:Open eyes with prompting. Answers some questions.  Difficult to understand. HEAD: Thin short graying hair. Normocephalic, atraumatic, face symmetric, no Cushingoid features. EYES: Brown eyes. Pupils equal round and reactive to light and accomodation. No conjunctivitis or scleral icterus. ENT: NG tube in place. Oropharynx clear. Poor dentition.  RESPIRATORY: Upper airway sounds.  Decreased breath sounds at the bases.  No wheezes. CARDIOVASCULAR: Regular rate and rhythm without murmur, rub  or gallop. ABDOMEN: Not distended.  Soft, bowel sounds, and no hepatosplenomegaly. No tenderness. No guarding or rebound tenderness.  No masses.  Rectal tube in place with brown stool SKIN: No rashes, ulcers or lesions. EXTREMITIES: No edema, no skin discoloration or tenderness. No palpable cords. NEUROLOGICAL:Somewhat interactive. Follows some commands.  Results for orders placed or performed during the hospital encounter of 11/29/14 (from the past 48 hour(s))  Glucose, capillary     Status: Abnormal   Collection Time: 12/15/14 11:49 PM  Result Value Ref Range   Glucose-Capillary 158 (H) 65 - 99 mg/dL  CBC with Differential     Status: Abnormal   Collection Time: 12/16/14  5:24 AM  Result Value Ref Range   WBC 9.4 3.8 - 10.6 K/uL   RBC 2.57 (L) 4.40 - 5.90 MIL/uL   Hemoglobin 7.1 (L) 13.0 - 18.0 g/dL   HCT 22.1 (L) 40.0 - 52.0 %   MCV 85.9 80.0 - 100.0 fL   MCH 27.6 26.0 - 34.0 pg   MCHC 32.1 32.0 - 36.0 g/dL   RDW 20.2 (H) 11.5 - 14.5 %   Platelets 59 (L) 150 - 440 K/uL   Neutrophils Relative % 73 43 - 77 %   Lymphocytes Relative 14 12 - 46 %   Monocytes Relative 5 3 - 12 %   Eosinophils Relative 0 0 - 5 %   Basophils Relative 0 0 - 1 %   Band Neutrophils 2 0 - 10 %   Metamyelocytes Relative 5 %   Myelocytes 1 %   Promyelocytes Absolute 0 %   Blasts 0 %   nRBC 0  0 /100 WBC   Neutro Abs 7.6 1.7 - 7.7 K/uL   Lymphs Abs 1.3 0.7 - 4.0 K/uL   Monocytes Absolute 0.5 0.1 - 1.0 K/uL   Eosinophils Absolute 0.0 0.0 - 0.7 K/uL   Basophils Absolute 0.0 0.0 - 0.1 K/uL   RBC Morphology POLYCHROMASIA PRESENT    WBC Morphology TOXIC GRANULATION     Comment: DOHLE BODIES  Vancomycin, trough     Status: Abnormal   Collection Time: 12/16/14  5:24 AM  Result Value Ref Range   Vancomycin Tr 31 (HH) 10 - 20 ug/mL    Comment: CRITICAL RESULT CALLED TO, READ BACK BY AND VERIFIED WITH  ADREAN ARNORLD @0639  ON 12/16/14 BY HKP   Basic metabolic panel     Status: Abnormal   Collection  Time: 12/16/14  5:24 AM  Result Value Ref Range   Sodium 144 135 - 145 mmol/L   Potassium 3.0 (L) 3.5 - 5.1 mmol/L   Chloride 116 (H) 101 - 111 mmol/L   CO2 19 (L) 22 - 32 mmol/L   Glucose, Bld 145 (H) 65 - 99 mg/dL   BUN 89 (H) 6 - 20 mg/dL   Creatinine, Ser 6.00 (H) 0.61 - 1.24 mg/dL   Calcium 8.5 (L) 8.9 - 10.3 mg/dL   GFR calc non Af Amer 9 (L) >60 mL/min   GFR calc Af Amer 10 (L) >60 mL/min    Comment: (NOTE) The eGFR has been calculated using the CKD EPI equation. This calculation has not been validated in all clinical situations. eGFR's persistently <60 mL/min signify possible Chronic Kidney Disease.    Anion gap 9 5 - 15  Magnesium     Status: None   Collection Time: 12/16/14  5:24 AM  Result Value Ref Range   Magnesium 1.8 1.7 - 2.4 mg/dL  Phosphorus     Status: None   Collection Time: 12/16/14  5:24 AM  Result Value Ref Range   Phosphorus 4.5 2.5 - 4.6 mg/dL  Glucose, capillary     Status: Abnormal   Collection Time: 12/16/14  5:46 AM  Result Value Ref Range   Glucose-Capillary 141 (H) 65 - 99 mg/dL  Glucose, capillary     Status: Abnormal   Collection Time: 12/16/14 11:48 AM  Result Value Ref Range   Glucose-Capillary 159 (H) 65 - 99 mg/dL   Comment 1 Notify RN   Potassium     Status: Abnormal   Collection Time: 12/16/14  2:14 PM  Result Value Ref Range   Potassium 3.2 (L) 3.5 - 5.1 mmol/L  Glucose, capillary     Status: Abnormal   Collection Time: 12/16/14  5:59 PM  Result Value Ref Range   Glucose-Capillary 113 (H) 65 - 99 mg/dL   Comment 1 Notify RN   Glucose, capillary     Status: Abnormal   Collection Time: 12/17/14 12:06 AM  Result Value Ref Range   Glucose-Capillary 141 (H) 65 - 99 mg/dL  Vancomycin, trough     Status: Abnormal   Collection Time: 12/17/14  4:53 AM  Result Value Ref Range   Vancomycin Tr 26 (HH) 10 - 20 ug/mL    Comment: CRITICAL RESULT CALLED TO, READ BACK BY AND VERIFIED WITH  SCOTT CHRISTY@0609  ON 12/16/14 BY HKP   Basic  metabolic panel     Status: Abnormal   Collection Time: 12/17/14  4:53 AM  Result Value Ref Range   Sodium 143 135 - 145 mmol/L   Potassium 3.8 3.5 -  5.1 mmol/L   Chloride 123 (H) 101 - 111 mmol/L   CO2 16 (L) 22 - 32 mmol/L   Glucose, Bld 157 (H) 65 - 99 mg/dL   BUN 103 (H) 6 - 20 mg/dL    Comment: RESULT CONFIRMED BY MANUAL DILUTION   Creatinine, Ser 6.31 (H) 0.61 - 1.24 mg/dL   Calcium 8.5 (L) 8.9 - 10.3 mg/dL   GFR calc non Af Amer 8 (L) >60 mL/min   GFR calc Af Amer 9 (L) >60 mL/min    Comment: (NOTE) The eGFR has been calculated using the CKD EPI equation. This calculation has not been validated in all clinical situations. eGFR's persistently <60 mL/min signify possible Chronic Kidney Disease.    Anion gap 4 (L) 5 - 15  Magnesium     Status: None   Collection Time: 12/17/14  4:53 AM  Result Value Ref Range   Magnesium 1.9 1.7 - 2.4 mg/dL  Phosphorus     Status: None   Collection Time: 12/17/14  4:53 AM  Result Value Ref Range   Phosphorus 3.7 2.5 - 4.6 mg/dL  CBC     Status: Abnormal   Collection Time: 12/17/14  4:53 AM  Result Value Ref Range   WBC 7.7 3.8 - 10.6 K/uL   RBC 2.48 (L) 4.40 - 5.90 MIL/uL   Hemoglobin 6.9 (L) 13.0 - 18.0 g/dL   HCT 21.5 (L) 40.0 - 52.0 %   MCV 86.8 80.0 - 100.0 fL   MCH 27.7 26.0 - 34.0 pg   MCHC 31.9 (L) 32.0 - 36.0 g/dL   RDW 20.0 (H) 11.5 - 14.5 %   Platelets 72 (L) 150 - 440 K/uL  Glucose, capillary     Status: Abnormal   Collection Time: 12/17/14  6:12 AM  Result Value Ref Range   Glucose-Capillary 146 (H) 65 - 99 mg/dL  Glucose, capillary     Status: Abnormal   Collection Time: 12/17/14 12:03 PM  Result Value Ref Range   Glucose-Capillary 144 (H) 65 - 99 mg/dL   Comment 1 Notify RN   TSH     Status: None   Collection Time: 12/17/14 12:26 PM  Result Value Ref Range   TSH 0.698 0.350 - 4.500 uIU/mL  Vancomycin, random     Status: None   Collection Time: 12/17/14  5:26 PM  Result Value Ref Range   Vancomycin Rm 21 ug/mL     Comment:        Random Vancomycin therapeutic range is dependent on dosage and time of specimen collection. A peak range is 20.0-40.0 ug/mL A trough range is 5.0-15.0 ug/mL          CBC     Status: Abnormal   Collection Time: 12/17/14  8:15 PM  Result Value Ref Range   WBC 8.3 3.8 - 10.6 K/uL   RBC 2.49 (L) 4.40 - 5.90 MIL/uL   Hemoglobin 7.0 (L) 13.0 - 18.0 g/dL   HCT 21.7 (L) 40.0 - 52.0 %   MCV 87.1 80.0 - 100.0 fL   MCH 28.0 26.0 - 34.0 pg   MCHC 32.1 32.0 - 36.0 g/dL   RDW 19.3 (H) 11.5 - 14.5 %   Platelets 86 (L) 150 - 440 K/uL   Dg Abd 2 Views  12/16/2014   CLINICAL DATA:  Followup abdominal distension.  EXAM: ABDOMEN - 2 VIEW  COMPARISON:  Radiographs 12/15/2014  FINDINGS: Improved bowel gas pattern with less distended bowel. No free air. The left double-J ureteral  stent is stable. The NG tube is stable.  IMPRESSION: Improved bowel gas pattern with less distended air-filled bowel.   Electronically Signed   By: Marijo Sanes M.D.   On: 12/16/2014 09:57   Dg Abd Portable 1v  12/16/2014   CLINICAL DATA:  Nasogastric tube placement  EXAM: PORTABLE ABDOMEN - 1 VIEW  COMPARISON:  Portable exam at 1157 hr compared to 12/16/2014 at 0940 hr  FINDINGS: Course of nasogastric tube is poorly visualized in the chest due to motion but appears to be coiled in the proximal stomach.  LEFT nephrostomy tube noted.  Bowel gas pattern poorly assessed due to motion.  RIGHT basilar atelectasis.  IMPRESSION: Nasogastric tube appears coiled in the proximal stomach.   Electronically Signed   By: Lavonia Dana M.D.   On: 12/16/2014 12:17    Assessment:  Caleb Francis is a 69 y.o. 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 He was admitted with Klebsiella sepsis due to pyelonephritis and associated altered mental status. He subsequently developed renal insufficiency with assciated hypernatremia. He is out of the ICU and on the telemetry unit.  He has a  history of ICU psychosis and altered mental status secondary to hypernatremia.  He has progressive renal failure.  He had a platelet transfusion reaction on 12/06/2014. Blood cultures from 12/06/2014 grew staph auricularis (coag negative staph) . He had an upper GI bleed on 12/07/2014. Hematocrit is slowly drifting down. Platelet count is adequate.  WBC and ANC are normal off GCSF.  He has an ileus and is s/p rectal tube placement on 12/12/2014. Renal failure with planned short term hemodialysis.  Plan: 1. Hematology/Oncology: Day 21 s/p chemotherapy. WBC is normal.  Patient not neutropenic.GCSF discontinued 12/12/2014.  Platelet count good.  Hematocrit slowly drifting down.  Type and screen with AM labs and likely transfusion PRBCs.Premed all blood products with Tylenol and Benadryl. 2. Infectious disease: Klebsiella oxytoca bacteremia treated.  Cetazidime completed.  Vancomycin continues until 07/09 for coag negative staph.  No evidence of endocarditis by echo. No further positive blood cultures. 3. Nephrology: Hypernatremia resolved.  Progressive renal insufficiency. After meeting with the family, decision with nephrology to proceed with short term dialysis (may improve mental status). Patient not a long term dialysis candidate.  All medications renally adjusted. 4. Neurology:  Lethargic.  Patient with metabolic encephalopathy possibly secondary to worsening renal function. Reassess after dialysis initiated.  5. Gastroenterology: UGI bleed resolved. Ileus s/p rectal tube.  TPN started 12/12/2014. 6. Disposition: Prognosis poor.  Unckear if patient will return to baseline to be able to proceed to transplant.  Will discuss case with bone marrow transplant team at [facility].  DNR code status.  Lequita Asal, MD  12/17/2014, 10:21 PM

## 2014-12-17 NOTE — Progress Notes (Signed)
Nutrition Follow-up  DOCUMENTATION CODES:  Severe malnutrition in context of acute illness/injury  INTERVENTION:   (Coordination of Care): recommend continuing current TPN (5%AA/15%Dextrose without electrolytes) at 70 ml/hr; noted UOP improved, plan for trial of HD once catheter placed. Phosphorus improved, with initiation of HD, may be able to transition to electrolyte containing TPN which will better meet pt's calorie needs. Continue to assess for ability to initiate trial of TF vs po diet as clinically feasible.   NUTRITION DIAGNOSIS:  Inadequate oral intake related to inability to eat as evidenced by NPO status.   GOAL:  Patient will meet greater than or equal to 90% of their needs   MONITOR:   (PN, Energy Intake, Anthropometrics, Electrolyte/Renal Profile, Glucose Profile, Digestive System)  ASSESSMENT:  Pt alert on visit this AM; again stated to writer, "hey, how are you?"  Diet Order: NPO  PN: 5%AA/15%Dextrose at rate of 70 ml/hr  Digestive System: NG with 800 mL in cannister; flexiseal with minimal output, ileus improving per MD documentation  Urine Volume: 2600 mL in 24 hours per documentation  Electrolyte/Renal and Glucose Profile:  Recent Labs Lab 12/14/14 0502  12/15/14 1508 12/16/14 0524 12/16/14 1414 12/17/14 0453  NA 143  < > 142 144  --  143  K 3.1*  < > 3.3* 3.0* 3.2* 3.8  CL 112*  < > 114* 116*  --  123*  CO2 24  < > 20* 19*  --  16*  BUN 78*  < > 99* 89*  --  103*  CREATININE 4.91*  < > 5.74* 6.00*  --  6.31*  CALCIUM 8.0*  < > 8.2* 8.5*  --  8.5*  MG 2.3  --   --  1.8  --  1.9  PHOS 5.9*  --   --  4.5  --  3.7  GLUCOSE 158*  < > 138* 145*  --  157*  < > = values in this interval not displayed.    Height:  Ht Readings from Last 1 Encounters:  11/29/14 6' (1.829 m)    Weight:  Wt Readings from Last 1 Encounters:  12/17/14 139 lb 15.9 oz (63.5 kg)    Ideal Body Weight:     Wt Readings from Last 10 Encounters:  12/17/14 139 lb  15.9 oz (63.5 kg)  11/27/14 150 lb 5.7 oz (68.2 kg)  11/25/14 156 lb (70.761 kg)  11/18/14 157 lb 11.2 oz (71.532 kg)  11/05/14 164 lb 0.4 oz (74.4 kg)  10/24/14 165 lb (74.844 kg)  10/24/14 169 lb 5 oz (76.8 kg)  10/22/14 168 lb (76.204 kg)    BMI:  Body mass index is 18.98 kg/(m^2).  Estimated Nutritional Needs:  Kcal:  1994-2357kcals, BEE: 1511kcals, TEE: IF 1.1-1.3)(AF 1.2)  Protein:  74-88g protein (1.0-1.2g/kg)  Fluid:  1838-2245mL of fluid (25-17mL/kg)  Skin:     Diet Order:  Diet NPO time specified TPN (CLINIMIX) Adult without lytes  EDUCATION NEEDS:  No education needs identified at this time   Intake/Output Summary (Last 24 hours) at 12/17/14 1437 Last data filed at 12/17/14 1146  Gross per 24 hour  Intake   2103 ml  Output   2840 ml  Net   -737 ml    HIGH Care Level  Kerman Passey MS, RD, LDN [phone] Pager

## 2014-12-17 NOTE — Clinical Social Work Note (Signed)
Case was reviewed with RNCM, LTAC will be attempted.  [facility] stated that they can take pt back to complete his rehab, but only once the TPN has been discontinued.  CSW will continue to follow

## 2014-12-17 NOTE — Progress Notes (Signed)
[place] at [facility]                                                                                                                                                                                            Patient Demographics   Jadan Hinojos, is a 69 y.o. male, DOB - 1946-04-28, VPX:[id number]  Admit date - 11/29/2014   Admitting Physician Aldean Jewett, MD  Outpatient Primary MD for the patient is No PCP Per Patient   Subjective:   No concerns from patient. Awake but drowzy. Seen earlier today. Family not in room  Review of Systems:   ROS drowzy  Vitals:   Filed Vitals:   12/16/14 2039 12/17/14 0614 12/17/14 1204 12/17/14 1206  BP: 110/58 125/60  113/67  Pulse: 102 48 103 105  Temp: 97.7 F (36.5 C) 97.8 F (36.6 C)  97.8 F (36.6 C)  TempSrc: Oral Oral  Oral  Resp: 18 22  18   Height:      Weight:  63.5 kg (139 lb 15.9 oz)    SpO2: 100% 100% 100%     Wt Readings from Last 3 Encounters:  12/17/14 63.5 kg (139 lb 15.9 oz)  11/27/14 68.2 kg (150 lb 5.7 oz)  11/25/14 70.761 kg (156 lb)     Intake/Output Summary (Last 24 hours) at 12/17/14 1607 Last data filed at 12/17/14 1146  Gross per 24 hour  Intake   2103 ml  Output   2840 ml  Net   -737 ml    Physical Exam:   GENERAL: critically ill-appearing PERRLA Atraumatic  NECK: Supple. There is no jugular venous distention. No bruits, no lymphadenopathy, no thyromegaly.  HEART:  Tachycardic 2/6 systolic ejection murmur no rubs LUNGS: Bilateral rhonchi and wheezing.  ABDOMEN: Distended bowel hypoactive without rebound or guarding  EXTREMITIES: No evidence of any cyanosis, clubbing,   NEUROLOGIC: Lethargic  SKIN: Moist and warm with no rashes appreciated.  Psych:    no agitation reported overnight.  Radiology Reports 12/10/2014: CT abdomen: Large distended colonic ileus. No obstruction  12/12/2014: KUB   CBC  Recent Labs Lab 12/12/14 0535 12/13/14 0448  12/14/14 0502 12/14/14 1955 12/15/14 0529 12/16/14 0524 12/17/14 0453  WBC 6.4 11.1* 10.0  --  9.4 9.4 7.7  HGB 7.9* 8.2* 7.3* 8.0* 7.5* 7.1* 6.9*  HCT 23.8* 25.1* 22.4* 23.4* 22.6* 22.1* 21.5*  PLT 16* 36* 36*  --  43* 59* 72*  MCV 87.7 87.7 87.7  --  84.7 85.9 86.8  MCH 29.0 28.7 28.6  --  27.9 27.6 27.7  MCHC 33.1 32.7 32.6  --  33.0 32.1 31.9*  RDW 18.2* 18.1* 18.2*  --  20.3* 20.2* 20.0*  LYMPHSABS 1.2 2.3 1.4  --  0.8 1.3  --   MONOABS 0.1 0.9 0.6  --  0.2 0.5  --   EOSABS 0.1 0.0 0.0  --  0.0 0.0  --   BASOSABS 0.0 0.0 0.0  --  0.0 0.0  --     Chemistries   Recent Labs Lab 12/12/14 0535  12/13/14 0448  12/14/14 0502 12/14/14 1620 12/15/14 0529 12/15/14 1508 12/16/14 0524 12/16/14 1414 12/17/14 0453  NA 143  --  145  --  143 SEE COMMENTS 143 142 144  --  143  K 3.2*  < > 3.4*  < > 3.1* 3.5 3.1* 3.3* 3.0* 3.2* 3.8  CL 112*  --  111  --  112* 110 112* 114* 116*  --  123*  CO2 24  --  23  --  24 22 20* 20* 19*  --  16*  GLUCOSE 177*  --  192*  --  158* 182* 148* 138* 145*  --  157*  BUN 60*  --  64*  --  78* 83* 92* 99* 89*  --  103*  CREATININE 3.73*  < > 4.26*  --  4.91* 5.34* 5.50* 5.74* 6.00*  --  6.31*  CALCIUM 7.8*  --  8.7*  --  8.0* 8.0* 8.0* 8.2* 8.5*  --  8.5*  MG 2.1  --  2.2  --  2.3  --   --   --  1.8  --  1.9  AST 13*  --   --   --  12*  --   --   --   --   --   --   ALT 9*  --   --   --  9*  --   --   --   --   --   --   ALKPHOS 112  --   --   --  114  --   --   --   --   --   --   BILITOT 0.7  --   --   --  0.6  --   --   --   --   --   --   < > = values in this interval not displayed. -------------------------------------------------------------------------------------------------------------------  Recent Labs  12/17/14 1226  TSH 0.698   ------------------------------------------------------------------------------------------------------------------  Coagulation profile No results for input(s): INR, PROTIME in the last 168  hours.   Assessment & Plan  This is a 69 year old male with mantle cell lymphoma undergoing chemotherapy who was admitted June 15 from oncology clinic with altered mental status and chest pain.   # Acute renal failure due to ATN : Worsening  Appreciate nephrology input. Patient's creatinine continues to worsen. To get started on HD per Dr. Holley Raring. Would not change is prognosis.  # Sepsis: With Klebsiella UTI and bacteremia and Staph bacteremia Patient's urine culture and blood cultures were positive for Klebsiella on admission. [place]. Repeat blood cx on 12/06/2014 are growing  STAPHYLOCOCCUS AURICULARIS . Vancomycin till 12/21/2014.  # altered mental status - This is due to metabolic encephalopathy due to sepsis and urinary tract infection/ARF/Hypernatremia along with ICU delirium. - CT head/MRI brain showing no evidence of acute pathology.  - Mental status much improved  # chest pain on admission: No EKG changes to suggest ischemia.  Continue to monitor for any further cardiac symptoms  # Mantle  cell lymphoma: Patient is on salvage chemotherapy. Patient follows with Dr. Mike Gip.   He is s/p chemo He was on GCSF and no longer neutropenic.    # Panctytopenia: Due to chemotherapy and Mantle cell lymphoma, patient is currently on Neupogen. Patient is s/p 2 units PRBC on 6/25 and 1 unit 12/14/2014 and has received PLT transfusion almost daily. Patient needs premedication prior to any transfusions including Benadryl and Tylenol.   # Thrombocytopenia This is due to sepsis/lymphoma and chemo  # colonic Ileus with GI bleed: CT scan shows colonic ileus that has worsened. Patient is no longer neutropenic. Rectal tube in place. Repeat xray showing some improvement. Will d/c bisacodyl suppositories and lactulose due diarrhea. Start daily miralax.  # Hypernatremia - resolved  # GI bleed: Patient currently has an NG tube placed.  Restart PPI. Discussed with Dr. Gustavo Lah. High risk for  re bleeding due to thrombocytopenia.  # Acute blood loss anmeia over pancytopenia Due to GI losses and lymphoma.   Overall Prognosis is very poor   DNR  DVT Prophylaxis   SCD's    Lab Results  Component Value Date   PLT 72* 12/17/2014     Poor prognosis  Total time spent 35 minutes  Greater than 50% of time spent in care and coordination  Hillary Bow R M.D on 12/17/2014 at 4:07 PM  Between 7am to 6pm - Pager - [phone]  After 6pm go to [email or web address] - password [email or web address] [facility]   Office  [phone]

## 2014-12-17 NOTE — Progress Notes (Signed)
ANTIBIOTIC CONSULT NOTE - FOLLOW UP  Pharmacy Consult for Vancomycin Indication: Staph auricularis bacteremia  No Known Allergies  Patient Measurements: Height: 6' (182.9 cm) Weight: 138 lb 9.6 oz (62.869 kg) IBW/kg (Calculated) : 77.6   Vital Signs: Temp: 98.2 F (36.8 C) (07/05 2150) Temp Source: Oral (07/05 2150) BP: 100/55 mmHg (07/05 2150) Pulse Rate: 126 (07/05 2150) Intake/Output from previous day: 07/04 0701 - 07/05 0700 In: -  Out: 2650 [Urine:2600; Stool:50] Intake/Output from this shift: Total I/O In: -  Out: 350 [Urine:350]  Labs:  Recent Labs  12/15/14 1508 12/16/14 0524 12/17/14 0453 12/17/14 2015  WBC  --  9.4 7.7 8.3  HGB  --  7.1* 6.9* 7.0*  PLT  --  59* 72* 86*  CREATININE 5.74* 6.00* 6.31*  --    Estimated Creatinine Clearance: 10.1 mL/min (by C-G formula based on Cr of 6.31).  Recent Labs  12/15/14 0529 12/16/14 0524 12/17/14 0453 12/17/14 1726  VANCOTROUGH  --  31* 26*  --   VANCORANDOM 17  --   --  21     Microbiology: Recent Results (from the past 720 hour(s))  Culture, blood (single)     Status: None   Collection Time: 11/29/14 11:09 AM  Result Value Ref Range Status   Specimen Description BLOOD  Final   Special Requests NONE  Final   Culture  Setup Time   Final    GRAM NEGATIVE RODS AEROBIC BOTTLE ONLY CRITICAL RESULT CALLED TO, READ BACK BY AND VERIFIED WITH: SANDRA VORBA ON 11/30/14 AT 0825 BY JEF    Culture KLEBSIELLA OXYTOCA AEROBIC BOTTLE ONLY   Final   Report Status 12/04/2014 FINAL  Final   Organism ID, Bacteria KLEBSIELLA OXYTOCA  Final      Susceptibility   Klebsiella oxytoca - MIC*    AMPICILLIN >=32 RESISTANT Resistant     CEFTAZIDIME <=1 SENSITIVE Sensitive     CEFAZOLIN >=64 RESISTANT Resistant     CEFTRIAXONE <=1 SENSITIVE Sensitive     CIPROFLOXACIN <=0.25 SENSITIVE Sensitive     GENTAMICIN <=1 SENSITIVE Sensitive     IMIPENEM <=0.25 SENSITIVE Sensitive     TRIMETH/SULFA <=20 SENSITIVE Sensitive      CEFOXITIN <=4 SENSITIVE Sensitive     PIP/TAZO Value in next row Resistant      RESISTANT128    * KLEBSIELLA OXYTOCA  Urine culture     Status: None   Collection Time: 11/29/14 11:22 AM  Result Value Ref Range Status   Specimen Description URINE, CLEAN CATCH  Final   Special Requests NONE  Final   Culture >=100,000 COLONIES/mL KLEBSIELLA OXYTOCA  Final   Report Status 12/01/2014 FINAL  Final   Organism ID, Bacteria KLEBSIELLA OXYTOCA  Final      Susceptibility   Klebsiella oxytoca - MIC*    AMPICILLIN >=32 RESISTANT Resistant     CEFAZOLIN >=64 RESISTANT Resistant     CEFTRIAXONE 4 SENSITIVE Sensitive     CIPROFLOXACIN <=0.25 SENSITIVE Sensitive     GENTAMICIN <=1 SENSITIVE Sensitive     IMIPENEM <=0.25 SENSITIVE Sensitive     NITROFURANTOIN <=16 SENSITIVE Sensitive     TRIMETH/SULFA <=20 SENSITIVE Sensitive     CEFOXITIN <=4 SENSITIVE Sensitive     * >=100,000 COLONIES/mL KLEBSIELLA OXYTOCA  MRSA PCR Screening     Status: None   Collection Time: 11/29/14 12:13 PM  Result Value Ref Range Status   MRSA by PCR NEGATIVE NEGATIVE Final    Comment:  The GeneXpert MRSA Assay (FDA approved for NASAL specimens only), is one component of a comprehensive MRSA colonization surveillance program. It is not intended to diagnose MRSA infection nor to guide or monitor treatment for MRSA infections.   Culture, blood (routine x 2)     Status: None   Collection Time: 11/29/14  1:19 PM  Result Value Ref Range Status   Specimen Description BLOOD  Final   Special Requests Immunocompromised  Final   Culture NO GROWTH 5 DAYS  Final   Report Status 12/04/2014 FINAL  Final  Urine culture     Status: None   Collection Time: 11/30/14 10:12 AM  Result Value Ref Range Status   Specimen Description URINE, CATHETERIZED  Final   Special Requests Immunocompromised  Final   Culture NO GROWTH 2 DAYS  Final   Report Status 12/02/2014 FINAL  Final  Culture, blood (routine x 2)     Status: None    Collection Time: 12/06/14  8:08 PM  Result Value Ref Range Status   Specimen Description BLOOD  Final   Special Requests NONE  Final   Culture  Setup Time   Final    GRAM POSITIVE COCCI IN BOTH AEROBIC AND ANAEROBIC BOTTLES CRITICAL RESULT CALLED TO, READ BACK BY AND VERIFIED WITH: JENNIFER BEZARD AT 3825 12/08/14.PMH CONFIRMED BY RWW    Culture   Final    STAPHYLOCOCCUS AURICULARIS IN BOTH AEROBIC AND ANAEROBIC BOTTLES    Report Status 12/11/2014 FINAL  Final   Organism ID, Bacteria STAPHYLOCOCCUS AURICULARIS  Final      Susceptibility   Staphylococcus auricularis - MIC*    CIPROFLOXACIN >=8 RESISTANT Resistant     ERYTHROMYCIN >=8 RESISTANT Resistant     GENTAMICIN <=0.5 SENSITIVE Sensitive     OXACILLIN >=4 RESISTANT Resistant     TETRACYCLINE <=1 SENSITIVE Sensitive     VANCOMYCIN 1 SENSITIVE Sensitive     CLINDAMYCIN <=0.25 SENSITIVE Sensitive     TRIMETH/SULFA Value in next row Sensitive      SENSITIVE<=20    LEVOFLOXACIN Value in next row Intermediate      INTERMEDIATE4    * STAPHYLOCOCCUS AURICULARIS  Culture, blood (routine x 2)     Status: None   Collection Time: 12/06/14  8:40 PM  Result Value Ref Range Status   Specimen Description BLOOD  Final   Special Requests NONE  Final   Culture NO GROWTH 5 DAYS  Final   Report Status 12/11/2014 FINAL  Final  Culture, blood (routine x 2)     Status: None   Collection Time: 12/08/14 11:40 AM  Result Value Ref Range Status   Specimen Description BLOOD  Final   Special Requests Normal  Final   Culture NO GROWTH 6 DAYS  Final   Report Status 12/14/2014 FINAL  Final  Culture, blood (routine x 2)     Status: None   Collection Time: 12/08/14 11:49 AM  Result Value Ref Range Status   Specimen Description BLOOD  Final   Special Requests Normal  Final   Culture NO GROWTH 6 DAYS  Final   Report Status 12/14/2014 FINAL  Final  C difficile quick scan w PCR reflex ([facility] only)     Status: None   Collection Time: 12/14/14 10:11  AM  Result Value Ref Range Status   C Diff antigen NEGATIVE  Final   C Diff toxin NEGATIVE  Final   C Diff interpretation Negative for C. difficile  Final    Anti-infectives  Start     Dose/Rate Route Frequency Ordered Stop   12/15/14 0630  vancomycin (VANCOCIN) IVPB 750 mg/150 ml premix     750 mg 150 mL/hr over 60 Minutes Intravenous  Once 12/15/14 0616 12/15/14 0726   12/14/14 1300  vancomycin (VANCOCIN) IVPB 750 mg/150 ml premix  Status:  Discontinued     750 mg 150 mL/hr over 60 Minutes Intravenous Every 48 hours 12/13/14 1107 12/14/14 0604   12/14/14 0603  vancomycin (VANCOCIN) IVPB 750 mg/150 ml premix     750 mg 150 mL/hr over 60 Minutes Intravenous Daily PRN 12/14/14 0604     12/09/14 0900  erythromycin 250 mg in sodium chloride 0.9 % 100 mL IVPB  Status:  Discontinued     250 mg 100 mL/hr over 60 Minutes Intravenous 3 times per day 12/09/14 0859 12/13/14 1054   12/08/14 1100  metroNIDAZOLE (FLAGYL) IVPB 500 mg  Status:  Discontinued     500 mg 100 mL/hr over 60 Minutes Intravenous Every 8 hours 12/08/14 0958 12/11/14 1048   12/08/14 0900  vancomycin (VANCOCIN) IVPB 750 mg/150 ml premix  Status:  Discontinued     750 mg 150 mL/hr over 60 Minutes Intravenous Every 24 hours 12/07/14 1102 12/12/14 0955   12/07/14 1030  cefTAZidime (FORTAZ) 2 g in dextrose 5 % 50 mL IVPB  Status:  Discontinued     2 g 100 mL/hr over 30 Minutes Intravenous Every 24 hours 12/07/14 1021 12/11/14 1048   12/07/14 0700  vancomycin (VANCOCIN) IVPB 1000 mg/200 mL premix  Status:  Discontinued     1,000 mg 200 mL/hr over 60 Minutes Intravenous Every 24 hours 12/06/14 2225 12/07/14 1102   12/06/14 2200  piperacillin-tazobactam (ZOSYN) IVPB 4.5 g  Status:  Discontinued     4.5 g 200 mL/hr over 30 Minutes Intravenous 3 times per day 12/06/14 1946 12/07/14 1011   12/06/14 2000  vancomycin (VANCOCIN) IVPB 1000 mg/200 mL premix     1,000 mg 200 mL/hr over 60 Minutes Intravenous STAT 12/06/14 1947  12/06/14 2113   12/06/14 2000  cefTRIAXone (ROCEPHIN) 1 g in dextrose 5 % 50 mL IVPB - Premix  Status:  Discontinued     1 g 100 mL/hr over 30 Minutes Intravenous Every 24 hours 12/06/14 1959 12/07/14 1011   12/06/14 1945  piperacillin-tazobactam (ZOSYN) IVPB 3.375 g  Status:  Discontinued     3.375 g 100 mL/hr over 30 Minutes Intravenous 4 times per day 12/06/14 1940 12/06/14 2007   12/06/14 1945  vancomycin (VANCOCIN) IVPB 1000 mg/200 mL premix  Status:  Discontinued     1,000 mg 200 mL/hr over 60 Minutes Intravenous Every 12 hours 12/06/14 1940 12/06/14 2008   12/01/14 0945  piperacillin-tazobactam (ZOSYN) IVPB 3.375 g  Status:  Discontinued     3.375 g 12.5 mL/hr over 240 Minutes Intravenous 3 times per day 12/01/14 0936 12/03/14 1337   11/30/14 1000  cefTRIAXone (ROCEPHIN) 2 g in dextrose 5 % 50 mL IVPB - Premix  Status:  Discontinued    Comments:  Entered per MD progress note   2 g 100 mL/hr over 30 Minutes Intravenous Every 24 hours 11/29/14 2157 12/06/14 1940   11/29/14 1415  cefTRIAXone (ROCEPHIN) 1 g in dextrose 5 % 50 mL IVPB - Premix     1 g 100 mL/hr over 30 Minutes Intravenous  Once 11/29/14 1406 11/29/14 1452   11/29/14 1414  cefTRIAXone (ROCEPHIN) 20 MG/ML IVPB 50 mL    Comments:  MONAR, NELLIE: cabinet  override      11/29/14 1414 11/29/14 1424      Assessment: Patient with renal failure that may require renal replacement therapy on vancomycin for Staph auricularis bacteremia with vancomycin trough above goal. Last dose of vancomycin was 7/3.   Random vancomycin level 7/5 at 17:30: 24mcg/ml, however patient taken to dialysis this evening. Per RN, patient had about 1.5 hrs of HD  Goal of Therapy:  Vancomycin trough level 15-20 mcg/ml  Plan:  Random vancomycin level 7/5 at 17:30: 53mcg/ml, however patient taken to dialysis this evening. Per RN, patient had about 1.5 hrs of HD (after level drawn)  Will recheck random vancomycin level with AM labs and plan to redose  if level is < 15.   Unclear what the plan is for dialysis at this point. Will need to follow up with nephrology in regards to plans continued HD and adjust dosing accordingly.  Pharmacy to follow per consult.   Morrell Fluke C 12/17/2014,10:27 PM

## 2014-12-17 NOTE — Consult Note (Signed)
[facility] Vascular Consult Note  MRN : [id number]  Caleb Francis is a 69 y.o. ([birth year]) male who presents with chief complaint of  Chief Complaint  Patient presents with  . Altered Mental Status  .  History of Present Illness: Patient is admitted to the hospital with altered mental status and has developed acute renal failure.  The patient can not provide history due to his AMS.  The patient is critically ill with lymphoma and ARF. The nephrology service has decided to initiate dialysis at this time, and we are asked to place a temporary dialysis catheter for immediate dialysis use.    Current Facility-Administered Medications  Medication Dose Route Frequency Provider Last Rate Last Dose  . acetaminophen (TYLENOL) tablet 650 mg  650 mg Oral Q6H PRN Aldean Jewett, MD       Or  . acetaminophen (TYLENOL) suppository 650 mg  650 mg Rectal Q6H PRN Aldean Jewett, MD   650 mg at 12/02/14 1932  . albuterol (PROVENTIL) (2.5 MG/3ML) 0.083% nebulizer solution 2.5 mg  2.5 mg Nebulization Q4H PRN Lytle Butte, MD   2.5 mg at 12/14/14 0155  . antiseptic oral rinse (CPC / CETYLPYRIDINIUM CHLORIDE 0.05%) solution 7 mL  7 mL Mouth Rinse BID Aldean Jewett, MD   7 mL at 12/17/14 0935  . bethanechol (URECHOLINE) tablet 5 mg  5 mg Per Tube Q1H PRN Andria Meuse, NP      . haloperidol lactate (HALDOL) injection 2.5 mg  2.5 mg Intravenous Q4H PRN Henreitta Leber, MD   2.5 mg at 12/10/14 1632  . heparin lock flush 100 unit/mL  500 Units Intracatheter Daily PRN Lequita Asal, MD      . heparin lock flush 100 unit/mL  250 Units Intracatheter PRN Lequita Asal, MD      . insulin aspart (novoLOG) injection 1-3 Units  1-3 Units Subcutaneous Q6H Bettey Costa, MD   1 Units at 12/17/14 1241  . magnesium hydroxide (MILK OF MAGNESIA) suspension 30 mL  30 mL Oral Daily PRN Aldean Jewett, MD      . methylPREDNISolone sodium succinate (SOLU-MEDROL) 125 mg/2 mL  injection 60 mg  60 mg Intravenous Once Dustin Flock, MD      . metoprolol (LOPRESSOR) injection 5 mg  5 mg Intravenous Q4H PRN Dustin Flock, MD   5 mg at 12/13/14 1818  . mometasone-formoterol (DULERA) 100-5 MCG/ACT inhaler 2 puff  2 puff Inhalation BID Bettey Costa, MD   2 puff at 12/17/14 0933  . morphine 2 MG/ML injection 1-2 mg  1-2 mg Intravenous Q2H PRN [place] Jack Phifer, MD   2 mg at 12/14/14 1007  . ondansetron (ZOFRAN) injection 4 mg  4 mg Intravenous Q6H PRN Bettey Costa, MD   4 mg at 12/13/14 1505  . pantoprazole (PROTONIX) injection 40 mg  40 mg Intravenous Q12H Srikar Sudini, MD   40 mg at 12/17/14 1023  . potassium chloride (KLOR-CON) packet 40 mEq  40 mEq Oral BID Charlett Nose, RPH   40 mEq at 12/17/14 1022  . promethazine (PHENERGAN) injection 25 mg  25 mg Intravenous Q6H PRN Hillary Bow, MD   25 mg at 12/13/14 1628  . scopolamine (TRANSDERM-SCOP) 1 MG/3DAYS 1.5 mg  1 patch Transdermal Q72H Dustin Flock, MD   1.5 mg at 12/15/14 1841  . sodium chloride 0.9 % injection 10 mL  10 mL Intracatheter PRN Lequita Asal, MD      .  sodium chloride 0.9 % injection 10 mL  10 mL Intracatheter PRN Lequita Asal, MD      . sodium chloride 0.9 % injection 3 mL  3 mL Intracatheter PRN Lequita Asal, MD      . thiamine (B-1) injection 100 mg  100 mg Intravenous Daily Valora Corporal, MD   100 mg at 12/17/14 1023  . TPN (CLINIMIX) Adult without lytes   Intravenous Continuous TPN Munsoor Lateef, MD 70 mL/hr at 12/17/14 1745    . vancomycin (VANCOCIN) IVPB 750 mg/150 ml premix  750 mg Intravenous Daily PRN Srikar Sudini, MD      . ziprasidone (GEODON) injection 10 mg  10 mg Intramuscular Q12H PRN Flora Lipps, MD   10 mg at 12/05/14 1515  . ziprasidone (GEODON) injection 10 mg  10 mg Intramuscular QHS Valora Corporal, MD   10 mg at 12/16/14 2242   Facility-Administered Medications Ordered in Other Encounters  Medication Dose Route Frequency Provider Last Rate Last Dose  . 0.9 %   sodium chloride infusion   Intravenous Continuous Lequita Asal, MD 20 mL/hr at 11/06/14 1010 20 mL/hr at 11/06/14 1010  . 0.9 %  sodium chloride infusion   Intravenous Continuous Lequita Asal, MD   Stopped at 11/28/14 1500  . morphine 2 MG/ML injection 2 mg  2 mg Intravenous Q2H PRN Lequita Asal, MD   2 mg at 11/05/14 1422  . sodium chloride 0.9 % injection 10 mL  10 mL Intracatheter PRN Lequita Asal, MD   10 mL at 11/06/14 1011    Past Medical History  Diagnosis Date  . Arthritis   . Hypertension   . RA (rheumatoid arthritis)   . Anemia   . Mantle cell lymphoma     Dr Mike Gip  . History of nuclear stress test     a. 12/2013: low risk, no sig ischemia, no EKG changes, no artifact, EF 63%  . Chronic kidney disease   . Sepsis     Dr Ola Spurr    Past Surgical History  Procedure Laterality Date  . Back surgery    . Fracture surgery      ankle  . Portacath placement    . Cystoscopy w/ ureteral stent placement Left 10/24/2014    Procedure: CYSTOSCOPY WITH RETROGRADE PYELOGRAM/URETERAL STENT PLACEMENT;  Surgeon: Irine Seal, MD;  Location: [facility] ORS;  Service: Urology;  Laterality: Left;    Social History History  Substance Use Topics  . Smoking status: Former Smoker -- 0.50 packs/day for 60 years    Types: Cigarettes    Quit date: 10/18/2014  . Smokeless tobacco: Not on file  . Alcohol Use: No  lives at home apparently.  Patient unable to provide more history  Family History Family History  Problem Relation Age of Onset  . Cancer Mother     breast  . Cancer Father     bone cancer  . Colon cancer Neg Hx   . Liver disease Neg Hx   no bleeding or clotting issues reported.  Patient unable to add history  No Known Allergies   REVIEW OF SYSTEMS (Negative unless checked)    Unable to obtain the review of systems due to the patient's severe systemic illness and altered mental status.       Physical Examination  Filed Vitals:   12/16/14 2039  12/17/14 0614 12/17/14 1204 12/17/14 1206  BP: 110/58 125/60  113/67  Pulse: 102 48 103 105  Temp: 97.7 F (36.5  C) 97.8 F (36.6 C)  97.8 F (36.6 C)  TempSrc: Oral Oral  Oral  Resp: 18 22  18   Height:      Weight:  139 lb 15.9 oz (63.5 kg)    SpO2: 100% 100% 100%    Body mass index is 18.98 kg/(m^2). Gen: WD/WN Head: [place]/AT, No temporalis wasting Ear/Nose/Throat: Hearing grossly intact, nares w/o erythema or drainage,  Eyes: PERRLA, EOMI.  Neck: Supple, no nuchal rigidity.  No bruit or JVD.  Pulmonary:  Good air movement, clear to auscultation bilaterally.  Cardiac: RRR, normal S1, S2, no Murmurs, rubs or gallops.  Gastrointestinal: soft, non-tender/non-distended. No guarding/reflex.  Musculoskeletal: M/S 5/5 throughout.  Extremities without ischemic changes.  No deformity or atrophy. minimal Edema in the lower extremities bilaterally Neurologic: patient somewhat combative, not alert or oriented.  Moves all four extremities Psychiatric: Difficult to assess due to the severity of patient's illness. Dermatologic: No rashes or ulcers noted.   Lymph : No Cervical, Axillary, or Inguinal lymphadenopathy.      CBC Lab Results  Component Value Date   WBC 7.7 12/17/2014   HGB 6.9* 12/17/2014   HCT 21.5* 12/17/2014   MCV 86.8 12/17/2014   PLT 72* 12/17/2014    BMET    Component Value Date/Time   NA 143 12/17/2014 0453   NA 141 07/17/2014 1121   K 3.8 12/17/2014 0453   K 2.8* 07/17/2014 1121   CL 123* 12/17/2014 0453   CL 100 07/17/2014 1121   CO2 16* 12/17/2014 0453   CO2 32 07/17/2014 1121   GLUCOSE 157* 12/17/2014 0453   GLUCOSE 105* 07/17/2014 1121   BUN 103* 12/17/2014 0453   BUN 9 07/17/2014 1121   CREATININE 6.31* 12/17/2014 0453   CREATININE 1.09 07/17/2014 1121   CALCIUM 8.5* 12/17/2014 0453   CALCIUM 8.9 07/17/2014 1121   GFRNONAA 8* 12/17/2014 0453   GFRNONAA >60 02/11/2014 0851   GFRAA 9* 12/17/2014 0453   GFRAA >60 02/11/2014 0851   Estimated  Creatinine Clearance: 10.2 mL/min (by C-G formula based on Cr of 6.31).  COAG Lab Results  Component Value Date   INR 1.28 12/07/2014   INR 1.33 11/29/2014   INR 1.11 11/15/2014        Assessment/Plan 1. ARF.  Temp cath today 2. Mantle cell lymphoma.  Oncology patient.  May have contributed to renal failure. 3. Anemia, thrombocytopenia.  Possibly from lymphoma and treatment thereof.  [place] to be used with catheter placement but higher risk of bleeding.   We will proceed with temporary dialysis catheter placement at this time.  Risks and benefits discussed with patient and/or family, and the catheter will be placed to allow immediate initiation of dialysis.  If the patient's renal function does not improve throughout the hospital course, we will be happy to place a tunneled dialysis catheter for long term use prior to discharge.     DEW,JASON, MD  12/17/2014 7:11 PM

## 2014-12-17 NOTE — Progress Notes (Signed)
Subjective: Pt denies abdominal pain or nausea.  NG contents much lighter.  Good results with flexiseal.  Objective: Vital signs in last 24 hours: Temp:  [97.7 F (36.5 C)-98 F (36.7 C)] 97.8 F (36.6 C) (07/05 0614) Pulse Rate:  [48-106] 48 (07/05 0614) Resp:  [18-22] 22 (07/05 0614) BP: (110-125)/(58-67) 125/60 mmHg (07/05 0614) SpO2:  [98 %-100 %] 100 % (07/05 0614) Weight:  [139 lb 15.9 oz (63.5 kg)] 139 lb 15.9 oz (63.5 kg) (07/05 4098) Last BM Date: 12/17/14 No LMP for male patient. Body mass index is 18.98 kg/(m^2). General:  Alert, Answers questions appropriately today, daughter @ bedside Head:  Normocephalic and atraumatic. Eyes:  +exopthalmos.  Sclera clear, no icterus.   Conjunctiva pink. Mouth:  No deformity or lesions, oropharynx pink & moist. Neck:  Supple; no masses or thyromegaly. Heart:  Regular rate and rhythm; no murmurs, clicks, rubs, or gallops. Abdomen:   +BS x4, soft, mildly distended, nontender  No masses, hepatosplenomegaly or hernias noted.  No guarding or rebound tenderness.   Msk:  Symmetrical Pulses:  Normal pulses noted. Extremities:  No edema.  No cyanosis Neurologic:  Nonverbal, resting quietly Skin:  Intact without significant lesions or rashes.  Intake/Output from previous day: Jan 01, 2023 0701 - 07/05 0700 In: -  Out: 2650 [Urine:2600; Stool:50]  Lab Results:  Recent Labs  12/15/14 0529 January 01, 2015 0524 12/17/14 0453  WBC 9.4 9.4 7.7  HGB 7.5* 7.1* 6.9*  HCT 22.6* 22.1* 21.5*  PLT 43* 59* 72*   BMET  Recent Labs  12/15/14 1508 2015/01/01 0524 01-01-15 1414 12/17/14 0453  NA 142 144  --  143  K 3.3* 3.0* 3.2* 3.8  CL 114* 116*  --  123*  CO2 20* 19*  --  16*  GLUCOSE 138* 145*  --  157*  BUN 99* 89*  --  103*  CREATININE 5.74* 6.00*  --  6.31*  CALCIUM 8.2* 8.5*  --  8.5*   Studies/Results: Dg Abd 2 Views  2015-01-01   CLINICAL DATA:  Followup abdominal distension.  EXAM: ABDOMEN - 2 VIEW  COMPARISON:  Radiographs 12/15/2014   FINDINGS: Improved bowel gas pattern with less distended bowel. No free air. The left double-J ureteral stent is stable. The NG tube is stable.  IMPRESSION: Improved bowel gas pattern with less distended air-filled bowel.   Electronically Signed   By: Marijo Sanes M.D.   On: 01-01-15 09:57   Dg Abd 2 Views  12/15/2014   CLINICAL DATA:  Pseudo obstruction of the colon  EXAM: ABDOMEN - 2 VIEW  COMPARISON:  12/14/2014  FINDINGS: NG tube is in the stomach. There is a left ureteral stent remaining in place. Increasing dilatation of small bowel loops throughout the abdomen and upper pelvis. Continued mild gaseous distention of the cecum. No free air organomegaly.  IMPRESSION: Increasing gaseous distention of small bowel loops, now moderately dilated.   Electronically Signed   By: Rolm Baptise M.D.   On: 12/15/2014 10:39   Dg Abd Portable 1v  January 01, 2015   CLINICAL DATA:  Nasogastric tube placement  EXAM: PORTABLE ABDOMEN - 1 VIEW  COMPARISON:  Portable exam at 1157 hr compared to Jan 01, 2015 at 0940 hr  FINDINGS: Course of nasogastric tube is poorly visualized in the chest due to motion but appears to be coiled in the proximal stomach.  LEFT nephrostomy tube noted.  Bowel gas pattern poorly assessed due to motion.  RIGHT basilar atelectasis.  IMPRESSION: Nasogastric tube appears coiled in the proximal stomach.  Electronically Signed   By: Lavonia Dana M.D.   On: 12/16/2014 12:17    Assessment: Colonic ileus & ileus:  Much improved with NGT/rectal tube.  Electrolytes remain derranged & plans for possible dialysis in near future  Plan: #1  Continue flexiseal #2  Check TSH  The care of MALE MINISH will be discussed in direct collaboration with Dr Lucilla Lame, Attending Gastroenterologist.  LOS: 18 days  Vickey Huger  12/17/2014, 9:23 AM [facility]  [place], [place] [address] Phone: [phone] Fax : [phone]

## 2014-12-17 NOTE — Progress Notes (Signed)
ANTIBIOTIC CONSULT NOTE - FOLLOW UP  Pharmacy Consult for Vancomycin Indication: Staph auricularis bacteremia  No Known Allergies  Patient Measurements: Height: 6' (182.9 cm) Weight: 140 lb (63.504 kg) IBW/kg (Calculated) : 77.6   Vital Signs: Temp: 97.8 F (36.6 C) (07/05 0614) Temp Source: Oral (07/05 0614) BP: 125/60 mmHg (07/05 0614) Pulse Rate: 48 (07/05 0614) Intake/Output from previous day: 07/04 0701 - 07/05 0700 In: -  Out: 1525 [HFWYO:3785; Stool:50] Intake/Output from this shift: Total I/O In: -  Out: 325 [Urine:325]  Labs:  Recent Labs  12/15/14 0529 12/15/14 1508 12/16/14 0524 12/17/14 0453  WBC 9.4  --  9.4 7.7  HGB 7.5*  --  7.1* 6.9*  PLT 43*  --  59* 72*  CREATININE 5.50* 5.74* 6.00* 6.31*   Estimated Creatinine Clearance: 10.2 mL/min (by C-G formula based on Cr of 6.31).  Recent Labs  12/15/14 0529 12/16/14 0524 12/17/14 0453  VANCOTROUGH  --  31* 26*  VANCORANDOM 17  --   --      Microbiology: Recent Results (from the past 720 hour(s))  Culture, blood (single)     Status: None   Collection Time: 11/29/14 11:09 AM  Result Value Ref Range Status   Specimen Description BLOOD  Final   Special Requests NONE  Final   Culture  Setup Time   Final    GRAM NEGATIVE RODS AEROBIC BOTTLE ONLY CRITICAL RESULT CALLED TO, READ BACK BY AND VERIFIED WITH: SANDRA VORBA ON 11/30/14 AT 0825 BY JEF    Culture KLEBSIELLA OXYTOCA AEROBIC BOTTLE ONLY   Final   Report Status 12/04/2014 FINAL  Final   Organism ID, Bacteria KLEBSIELLA OXYTOCA  Final      Susceptibility   Klebsiella oxytoca - MIC*    AMPICILLIN >=32 RESISTANT Resistant     CEFTAZIDIME <=1 SENSITIVE Sensitive     CEFAZOLIN >=64 RESISTANT Resistant     CEFTRIAXONE <=1 SENSITIVE Sensitive     CIPROFLOXACIN <=0.25 SENSITIVE Sensitive     GENTAMICIN <=1 SENSITIVE Sensitive     IMIPENEM <=0.25 SENSITIVE Sensitive     TRIMETH/SULFA <=20 SENSITIVE Sensitive     CEFOXITIN <=4 SENSITIVE  Sensitive     PIP/TAZO Value in next row Resistant      RESISTANT128    * KLEBSIELLA OXYTOCA  Urine culture     Status: None   Collection Time: 11/29/14 11:22 AM  Result Value Ref Range Status   Specimen Description URINE, CLEAN CATCH  Final   Special Requests NONE  Final   Culture >=100,000 COLONIES/mL KLEBSIELLA OXYTOCA  Final   Report Status 12/01/2014 FINAL  Final   Organism ID, Bacteria KLEBSIELLA OXYTOCA  Final      Susceptibility   Klebsiella oxytoca - MIC*    AMPICILLIN >=32 RESISTANT Resistant     CEFAZOLIN >=64 RESISTANT Resistant     CEFTRIAXONE 4 SENSITIVE Sensitive     CIPROFLOXACIN <=0.25 SENSITIVE Sensitive     GENTAMICIN <=1 SENSITIVE Sensitive     IMIPENEM <=0.25 SENSITIVE Sensitive     NITROFURANTOIN <=16 SENSITIVE Sensitive     TRIMETH/SULFA <=20 SENSITIVE Sensitive     CEFOXITIN <=4 SENSITIVE Sensitive     * >=100,000 COLONIES/mL KLEBSIELLA OXYTOCA  MRSA PCR Screening     Status: None   Collection Time: 11/29/14 12:13 PM  Result Value Ref Range Status   MRSA by PCR NEGATIVE NEGATIVE Final    Comment:        The GeneXpert MRSA Assay (FDA approved for NASAL  specimens only), is one component of a comprehensive MRSA colonization surveillance program. It is not intended to diagnose MRSA infection nor to guide or monitor treatment for MRSA infections.   Culture, blood (routine x 2)     Status: None   Collection Time: 11/29/14  1:19 PM  Result Value Ref Range Status   Specimen Description BLOOD  Final   Special Requests Immunocompromised  Final   Culture NO GROWTH 5 DAYS  Final   Report Status 12/04/2014 FINAL  Final  Urine culture     Status: None   Collection Time: 11/30/14 10:12 AM  Result Value Ref Range Status   Specimen Description URINE, CATHETERIZED  Final   Special Requests Immunocompromised  Final   Culture NO GROWTH 2 DAYS  Final   Report Status 12/02/2014 FINAL  Final  Culture, blood (routine x 2)     Status: None   Collection Time:  12/06/14  8:08 PM  Result Value Ref Range Status   Specimen Description BLOOD  Final   Special Requests NONE  Final   Culture  Setup Time   Final    GRAM POSITIVE COCCI IN BOTH AEROBIC AND ANAEROBIC BOTTLES CRITICAL RESULT CALLED TO, READ BACK BY AND VERIFIED WITH: JENNIFER BEZARD AT 1540 12/08/14.PMH CONFIRMED BY RWW    Culture   Final    STAPHYLOCOCCUS AURICULARIS IN BOTH AEROBIC AND ANAEROBIC BOTTLES    Report Status 12/11/2014 FINAL  Final   Organism ID, Bacteria STAPHYLOCOCCUS AURICULARIS  Final      Susceptibility   Staphylococcus auricularis - MIC*    CIPROFLOXACIN >=8 RESISTANT Resistant     ERYTHROMYCIN >=8 RESISTANT Resistant     GENTAMICIN <=0.5 SENSITIVE Sensitive     OXACILLIN >=4 RESISTANT Resistant     TETRACYCLINE <=1 SENSITIVE Sensitive     VANCOMYCIN 1 SENSITIVE Sensitive     CLINDAMYCIN <=0.25 SENSITIVE Sensitive     TRIMETH/SULFA Value in next row Sensitive      SENSITIVE<=20    LEVOFLOXACIN Value in next row Intermediate      INTERMEDIATE4    * STAPHYLOCOCCUS AURICULARIS  Culture, blood (routine x 2)     Status: None   Collection Time: 12/06/14  8:40 PM  Result Value Ref Range Status   Specimen Description BLOOD  Final   Special Requests NONE  Final   Culture NO GROWTH 5 DAYS  Final   Report Status 12/11/2014 FINAL  Final  Culture, blood (routine x 2)     Status: None   Collection Time: 12/08/14 11:40 AM  Result Value Ref Range Status   Specimen Description BLOOD  Final   Special Requests Normal  Final   Culture NO GROWTH 6 DAYS  Final   Report Status 12/14/2014 FINAL  Final  Culture, blood (routine x 2)     Status: None   Collection Time: 12/08/14 11:49 AM  Result Value Ref Range Status   Specimen Description BLOOD  Final   Special Requests Normal  Final   Culture NO GROWTH 6 DAYS  Final   Report Status 12/14/2014 FINAL  Final  C difficile quick scan w PCR reflex ([facility] only)     Status: None   Collection Time: 12/14/14 10:11 AM  Result Value  Ref Range Status   C Diff antigen NEGATIVE  Final   C Diff toxin NEGATIVE  Final   C Diff interpretation Negative for C. difficile  Final    Anti-infectives    Start     Dose/Rate  Route Frequency Ordered Stop   12/15/14 0630  vancomycin (VANCOCIN) IVPB 750 mg/150 ml premix     750 mg 150 mL/hr over 60 Minutes Intravenous  Once 12/15/14 0616 12/15/14 0726   12/14/14 1300  vancomycin (VANCOCIN) IVPB 750 mg/150 ml premix  Status:  Discontinued     750 mg 150 mL/hr over 60 Minutes Intravenous Every 48 hours 12/13/14 1107 12/14/14 0604   12/14/14 0603  vancomycin (VANCOCIN) IVPB 750 mg/150 ml premix     750 mg 150 mL/hr over 60 Minutes Intravenous Daily PRN 12/14/14 0604     12/09/14 0900  erythromycin 250 mg in sodium chloride 0.9 % 100 mL IVPB  Status:  Discontinued     250 mg 100 mL/hr over 60 Minutes Intravenous 3 times per day 12/09/14 0859 12/13/14 1054   12/08/14 1100  metroNIDAZOLE (FLAGYL) IVPB 500 mg  Status:  Discontinued     500 mg 100 mL/hr over 60 Minutes Intravenous Every 8 hours 12/08/14 0958 12/11/14 1048   12/08/14 0900  vancomycin (VANCOCIN) IVPB 750 mg/150 ml premix  Status:  Discontinued     750 mg 150 mL/hr over 60 Minutes Intravenous Every 24 hours 12/07/14 1102 12/12/14 0955   12/07/14 1030  cefTAZidime (FORTAZ) 2 g in dextrose 5 % 50 mL IVPB  Status:  Discontinued     2 g 100 mL/hr over 30 Minutes Intravenous Every 24 hours 12/07/14 1021 12/11/14 1048   12/07/14 0700  vancomycin (VANCOCIN) IVPB 1000 mg/200 mL premix  Status:  Discontinued     1,000 mg 200 mL/hr over 60 Minutes Intravenous Every 24 hours 12/06/14 2225 12/07/14 1102   12/06/14 2200  piperacillin-tazobactam (ZOSYN) IVPB 4.5 g  Status:  Discontinued     4.5 g 200 mL/hr over 30 Minutes Intravenous 3 times per day 12/06/14 1946 12/07/14 1011   12/06/14 2000  vancomycin (VANCOCIN) IVPB 1000 mg/200 mL premix     1,000 mg 200 mL/hr over 60 Minutes Intravenous STAT 12/06/14 1947 12/06/14 2113    12/06/14 2000  cefTRIAXone (ROCEPHIN) 1 g in dextrose 5 % 50 mL IVPB - Premix  Status:  Discontinued     1 g 100 mL/hr over 30 Minutes Intravenous Every 24 hours 12/06/14 1959 12/07/14 1011   12/06/14 1945  piperacillin-tazobactam (ZOSYN) IVPB 3.375 g  Status:  Discontinued     3.375 g 100 mL/hr over 30 Minutes Intravenous 4 times per day 12/06/14 1940 12/06/14 2007   12/06/14 1945  vancomycin (VANCOCIN) IVPB 1000 mg/200 mL premix  Status:  Discontinued     1,000 mg 200 mL/hr over 60 Minutes Intravenous Every 12 hours 12/06/14 1940 12/06/14 2008   12/01/14 0945  piperacillin-tazobactam (ZOSYN) IVPB 3.375 g  Status:  Discontinued     3.375 g 12.5 mL/hr over 240 Minutes Intravenous 3 times per day 12/01/14 0936 12/03/14 1337   11/30/14 1000  cefTRIAXone (ROCEPHIN) 2 g in dextrose 5 % 50 mL IVPB - Premix  Status:  Discontinued    Comments:  Entered per MD progress note   2 g 100 mL/hr over 30 Minutes Intravenous Every 24 hours 11/29/14 2157 12/06/14 1940   11/29/14 1415  cefTRIAXone (ROCEPHIN) 1 g in dextrose 5 % 50 mL IVPB - Premix     1 g 100 mL/hr over 30 Minutes Intravenous  Once 11/29/14 1406 11/29/14 1452   11/29/14 1414  cefTRIAXone (ROCEPHIN) 20 MG/ML IVPB 50 mL    Comments:  MONAR, NELLIE: cabinet override  11/29/14 1414 11/29/14 1424      Assessment: Patient with renal failure that may require renal replacement therapy on vancomycin for Staph auricularis bacteremia with vancomycin trough above goal. Last dose of vancomycin was 7/3.   Goal of Therapy:  Vancomycin trough level 15-20 mcg/ml  Plan:  Will check another vancomycin random level in about 12 hours to calculate patient specific kinetics.   Ulice Dash D 12/17/2014,6:17 AM

## 2014-12-17 NOTE — Progress Notes (Signed)
[facility] PROGRESS NOTE Date of Admission:  11/29/2014     ID: Caleb Francis is a 69 y.o. male with sepsis and mantel cell lymphoma   Principal Problem:   Sepsis Active Problems:   Mantle cell lymphoma   Altered mental status   Chest pain   Metabolic encephalopathy   Ileus   Fecal impaction   Protein-calorie malnutrition, severe   Ogilvie's syndrome   Subjective: Has transferred out of unit Remains confused.  No fevers and ntp resolved.   ROS  Eleven systems are reviewed and negative except per hpi  Medications:  Antibiotics Given (last 72 hours)    Date/Time Action Medication Dose Rate   12/15/14 0626 Given   vancomycin (VANCOCIN) IVPB 750 mg/150 ml premix 750 mg 150 mL/hr     . antiseptic oral rinse  7 mL Mouth Rinse BID  . insulin aspart  1-3 Units Subcutaneous Q6H  . methylPREDNISolone (SOLU-MEDROL) injection  60 mg Intravenous Once  . mometasone-formoterol  2 puff Inhalation BID  . pantoprazole (PROTONIX) IV  40 mg Intravenous Q12H  . potassium chloride  40 mEq Oral BID  . scopolamine  1 patch Transdermal Q72H  . thiamine IV  100 mg Intravenous Daily  . ziprasidone  10 mg Intramuscular QHS    Objective: Vital signs in last 24 hours: Temp:  [97.7 F (36.5 C)-97.8 F (36.6 C)] 97.8 F (36.6 C) (07/05 1206) Pulse Rate:  [48-105] 105 (07/05 1206) Resp:  [18-22] 18 (07/05 1206) BP: (110-125)/(58-67) 113/67 mmHg (07/05 1206) SpO2:  [100 %] 100 % (07/05 1204) Weight:  [63.5 kg (139 lb 15.9 oz)] 63.5 kg (139 lb 15.9 oz) (07/05 4680) GEN - cachectic, able to answer questions Muddy sclera, ngt in place with dark drainage Reg Coarse bs bil Abd is distented tympanic- rectal tube in place No edema Neuro lethargic Access - portacath in place. No tenderness Lab Results  Recent Labs  12/16/14 0524 12/16/14 1414 12/17/14 0453  WBC 9.4  --  7.7  HGB 7.1*  --  6.9*  HCT 22.1*  --  21.5*  NA 144  --  143  K 3.0* 3.2* 3.8  CL  116*  --  123*  CO2 19*  --  16*  BUN 89*  --  103*  CREATININE 6.00*  --  6.31*   CBC Latest Ref Rng 12/17/2014 12/16/2014 12/15/2014  WBC 3.8 - 10.6 K/uL 7.7 9.4 9.4  Hemoglobin 13.0 - 18.0 g/dL 6.9(L) 7.1(L) 7.5(L)  Hematocrit 40.0 - 52.0 % 21.5(L) 22.1(L) 22.6(L)  Platelets 150 - 440 K/uL 72(L) 59(L) 43(L)     Microbiology: Results for orders placed or performed during the hospital encounter of 11/29/14  MRSA PCR Screening     Status: None   Collection Time: 11/29/14 12:13 PM  Result Value Ref Range Status   MRSA by PCR NEGATIVE NEGATIVE Final    Comment:        The GeneXpert MRSA Assay (FDA approved for NASAL specimens only), is one component of a comprehensive MRSA colonization surveillance program. It is not intended to diagnose MRSA infection nor to guide or monitor treatment for MRSA infections.   Culture, blood (routine x 2)     Status: None   Collection Time: 11/29/14  1:19 PM  Result Value Ref Range Status   Specimen Description BLOOD  Final   Special Requests Immunocompromised  Final   Culture NO GROWTH 5 DAYS  Final   Report Status 12/04/2014 FINAL  Final  Urine culture     Status: None   Collection Time: 11/30/14 10:12 AM  Result Value Ref Range Status   Specimen Description URINE, CATHETERIZED  Final   Special Requests Immunocompromised  Final   Culture NO GROWTH 2 DAYS  Final   Report Status 12/02/2014 FINAL  Final  Culture, blood (routine x 2)     Status: None   Collection Time: 12/06/14  8:08 PM  Result Value Ref Range Status   Specimen Description BLOOD  Final   Special Requests NONE  Final   Culture  Setup Time   Final    GRAM POSITIVE COCCI IN BOTH AEROBIC AND ANAEROBIC BOTTLES CRITICAL RESULT CALLED TO, READ BACK BY AND VERIFIED WITH: JENNIFER BEZARD AT 0354 12/08/14.PMH CONFIRMED BY RWW    Culture   Final    STAPHYLOCOCCUS AURICULARIS IN BOTH AEROBIC AND ANAEROBIC BOTTLES    Report Status 12/11/2014 FINAL  Final   Organism ID, Bacteria  STAPHYLOCOCCUS AURICULARIS  Final      Susceptibility   Staphylococcus auricularis - MIC*    CIPROFLOXACIN >=8 RESISTANT Resistant     ERYTHROMYCIN >=8 RESISTANT Resistant     GENTAMICIN <=0.5 SENSITIVE Sensitive     OXACILLIN >=4 RESISTANT Resistant     TETRACYCLINE <=1 SENSITIVE Sensitive     VANCOMYCIN 1 SENSITIVE Sensitive     CLINDAMYCIN <=0.25 SENSITIVE Sensitive     TRIMETH/SULFA Value in next row Sensitive      SENSITIVE<=20    LEVOFLOXACIN Value in next row Intermediate      INTERMEDIATE4    * STAPHYLOCOCCUS AURICULARIS  Culture, blood (routine x 2)     Status: None   Collection Time: 12/06/14  8:40 PM  Result Value Ref Range Status   Specimen Description BLOOD  Final   Special Requests NONE  Final   Culture NO GROWTH 5 DAYS  Final   Report Status 12/11/2014 FINAL  Final  Culture, blood (routine x 2)     Status: None   Collection Time: 12/08/14 11:40 AM  Result Value Ref Range Status   Specimen Description BLOOD  Final   Special Requests Normal  Final   Culture NO GROWTH 6 DAYS  Final   Report Status 12/14/2014 FINAL  Final  Culture, blood (routine x 2)     Status: None   Collection Time: 12/08/14 11:49 AM  Result Value Ref Range Status   Specimen Description BLOOD  Final   Special Requests Normal  Final   Culture NO GROWTH 6 DAYS  Final   Report Status 12/14/2014 FINAL  Final  C difficile quick scan w PCR reflex ([facility] only)     Status: None   Collection Time: 12/14/14 10:11 AM  Result Value Ref Range Status   C Diff antigen NEGATIVE  Final   C Diff toxin NEGATIVE  Final   C Diff interpretation Negative for C. difficile  Final    Studies/Results: Dg Abd 2 Views  13-Jan-2015   CLINICAL DATA:  Followup abdominal distension.  EXAM: ABDOMEN - 2 VIEW  COMPARISON:  Radiographs 12/15/2014  FINDINGS: Improved bowel gas pattern with less distended bowel. No free air. The left double-J ureteral stent is stable. The NG tube is stable.  IMPRESSION: Improved bowel gas pattern  with less distended air-filled bowel.   Electronically Signed   By: Marijo Sanes M.D.   On: Jan 13, 2015 09:57   Dg Abd Portable 1v  2015-01-13   CLINICAL DATA:  Nasogastric tube placement  EXAM: PORTABLE ABDOMEN -  1 VIEW  COMPARISON:  Portable exam at 1157 hr compared to 12/16/2014 at 0940 hr  FINDINGS: Course of nasogastric tube is poorly visualized in the chest due to motion but appears to be coiled in the proximal stomach.  LEFT nephrostomy tube noted.  Bowel gas pattern poorly assessed due to motion.  RIGHT basilar atelectasis.  IMPRESSION: Nasogastric tube appears coiled in the proximal stomach.   Electronically Signed   By: Lavonia Dana M.D.   On: 12/16/2014 12:17   Echo - Left ventricle: The cavity size was moderately dilated. There was mild concentric hypertrophy. Systolic function was moderately to severely reduced. The estimated ejection fraction was 35%. Diffuse hypokinesis. Doppler parameters are consistent with abnormal left ventricular relaxation (grade 1 diastolic dysfunction). - Aortic valve: There was severe regurgitation. Valve area (Vmax): 3.45 cm^2. - Mitral valve: There was moderate regurgitation. - Atrial septum: No defect or patent foramen ovale was identified. - Tricuspid valve: There was moderate regurgitation. Impressions: - severe LV dysfunction with left radical ejection fraction approximately 96% and mild diastolic dysfunction. There is severe aortic regurgitation and moderate mitral regurgitation advise cardiac evaluation.  Assessment/Plan: GURNOOR URSUA is a 69 y.o. male with mantle cell lymphoma undergoing chemo admitted 6/15 from oncology clinic with AMS, chest pain. He was found to have a UTI with U CX + and bacteremia with Klebsiella oxytoca and had been on ceftriaxone until 6/24 when he developed worsening confusion, chills and hemodynamic instability at time of platelet transfusion. He has been neutropenic as well and is getting GCSF  with his chemo. He also had metabolic derangement with hypernatremia (resolving) and ,ARF. He has also had an ileus/constipation and has NGT in place. Bcx are from 6/24 with Staph auricularis (a coag negative staph) in 1 of 2.. FU BCX 6/26 NGTD NTP resolved .  Recommendations He has completed a 2 week course of IV ceftazidime for  Klebsiella oxytoca - dcd ceftazidime 6/29 - monitor for recurrence of neutropenic fevers  GPC bacteremia with Staph auricularis 6/24 -  repeat Bcx 6/26 ngtd -Echo neg for Endocarditis but has severe CHF. -Cont vanco for the Staph auricularis bacteremia- rec 2 week course - stop date 7/9. -portacath need not be removed at this time.  I will sign off now but please call with questions.  [place], [place]   12/17/2014, 5:05 PM

## 2014-12-17 NOTE — Care Management (Signed)
Spoke with nephrology and informed that family would like to pursue hemodialysis.  Confirmed this decision with patient's son - Rishi Vicario.    He is requiring mittens.  Patient continues to have nasogastric tube and rectal tube for ileus- that is reported to be improving .  He is on TPN.  It is reported that patient has not been out of bed since admission.  Is receiving chemo for mantle cell lymphoma.  Being treated for bacteremia with continued Vancomycin. (has finished course of Cetazidime.  Oncology has doubts that patient's condition will return to baseline in order for patient to be considered for bone marrow transplant.  REceived one unit of packed cells within the past 24 hours Spoke with patient's son Roby about LTAC level of care.  Patient's insurance would require that he have acute stay of at least 21 days.  He is currently on day 18.  [facility] will speak with family members to see if would like to speak with an LTAC representative about this level of care option.

## 2014-12-17 NOTE — Progress Notes (Signed)
[place] for Vancomycin, TPN Electrolyte Management and Constipation Management Indication: ICU Status   No Known Allergies  Patient Measurements: Height: 6' (182.9 cm) Weight: 139 lb 15.9 oz (63.5 kg) IBW/kg (Calculated) : 77.6   Vital Signs: Temp: 97.8 F (36.6 C) (07/05 1206) Temp Source: Oral (07/05 1206) BP: 113/67 mmHg (07/05 1206) Pulse Rate: 105 (07/05 1206) Intake/Output from previous day: 07/04 0701 - 07/05 0700 In: -  Out: 2650 [Urine:2600; Stool:50] Intake/Output from this shift: Total I/O In: 2103 [TPN:2103] Out: 840 [Urine:650; Emesis/NG output:150; Stool:40] Vent settings for last 24 hours:    Labs:  Recent Labs  12/15/14 0529 12/15/14 1508 12/16/14 0524 12/17/14 0453  WBC 9.4  --  9.4 7.7  HGB 7.5*  --  7.1* 6.9*  HCT 22.6*  --  22.1* 21.5*  PLT 43*  --  59* 72*  CREATININE 5.50* 5.74* 6.00* 6.31*  MG  --   --  1.8 1.9  PHOS  --   --  4.5 3.7   Estimated Creatinine Clearance: 10.2 mL/min (by C-G formula based on Cr of 6.31). BMP Latest Ref Rng 12/17/2014 12/16/2014 12/16/2014  Glucose 65 - 99 mg/dL 157(H) - 145(H)  BUN 6 - 20 mg/dL 103(H) - 89(H)  Creatinine 0.61 - 1.24 mg/dL 6.31(H) - 6.00(H)  Sodium 135 - 145 mmol/L 143 - 144  Potassium 3.5 - 5.1 mmol/L 3.8 3.2(L) 3.0(L)  Chloride 101 - 111 mmol/L 123(H) - 116(H)  CO2 22 - 32 mmol/L 16(L) - 19(L)  Calcium 8.9 - 10.3 mg/dL 8.5(L) - 8.5(L)     Recent Labs  12/17/14 0006 12/17/14 0612 12/17/14 1203  GLUCAP 141* 146* 144*    Microbiology: Recent Results (from the past 720 hour(s))  Culture, blood (single)     Status: None   Collection Time: 11/29/14 11:09 AM  Result Value Ref Range Status   Specimen Description BLOOD  Final   Special Requests NONE  Final   Culture  Setup Time   Final    GRAM NEGATIVE RODS AEROBIC BOTTLE ONLY CRITICAL RESULT CALLED TO, READ BACK BY AND VERIFIED WITH: SANDRA VORBA ON 11/30/14 AT 0825 BY JEF    Culture KLEBSIELLA  OXYTOCA AEROBIC BOTTLE ONLY   Final   Report Status 12/04/2014 FINAL  Final   Organism ID, Bacteria KLEBSIELLA OXYTOCA  Final      Susceptibility   Klebsiella oxytoca - MIC*    AMPICILLIN >=32 RESISTANT Resistant     CEFTAZIDIME <=1 SENSITIVE Sensitive     CEFAZOLIN >=64 RESISTANT Resistant     CEFTRIAXONE <=1 SENSITIVE Sensitive     CIPROFLOXACIN <=0.25 SENSITIVE Sensitive     GENTAMICIN <=1 SENSITIVE Sensitive     IMIPENEM <=0.25 SENSITIVE Sensitive     TRIMETH/SULFA <=20 SENSITIVE Sensitive     CEFOXITIN <=4 SENSITIVE Sensitive     PIP/TAZO Value in next row Resistant      RESISTANT128    * KLEBSIELLA OXYTOCA  Urine culture     Status: None   Collection Time: 11/29/14 11:22 AM  Result Value Ref Range Status   Specimen Description URINE, CLEAN CATCH  Final   Special Requests NONE  Final   Culture >=100,000 COLONIES/mL KLEBSIELLA OXYTOCA  Final   Report Status 12/01/2014 FINAL  Final   Organism ID, Bacteria KLEBSIELLA OXYTOCA  Final      Susceptibility   Klebsiella oxytoca - MIC*    AMPICILLIN >=32 RESISTANT Resistant     CEFAZOLIN >=64 RESISTANT Resistant  CEFTRIAXONE 4 SENSITIVE Sensitive     CIPROFLOXACIN <=0.25 SENSITIVE Sensitive     GENTAMICIN <=1 SENSITIVE Sensitive     IMIPENEM <=0.25 SENSITIVE Sensitive     NITROFURANTOIN <=16 SENSITIVE Sensitive     TRIMETH/SULFA <=20 SENSITIVE Sensitive     CEFOXITIN <=4 SENSITIVE Sensitive     * >=100,000 COLONIES/mL KLEBSIELLA OXYTOCA  MRSA PCR Screening     Status: None   Collection Time: 11/29/14 12:13 PM  Result Value Ref Range Status   MRSA by PCR NEGATIVE NEGATIVE Final    Comment:        The GeneXpert MRSA Assay (FDA approved for NASAL specimens only), is one component of a comprehensive MRSA colonization surveillance program. It is not intended to diagnose MRSA infection nor to guide or monitor treatment for MRSA infections.   Culture, blood (routine x 2)     Status: None   Collection Time: 11/29/14  1:19  PM  Result Value Ref Range Status   Specimen Description BLOOD  Final   Special Requests Immunocompromised  Final   Culture NO GROWTH 5 DAYS  Final   Report Status 12/04/2014 FINAL  Final  Urine culture     Status: None   Collection Time: 11/30/14 10:12 AM  Result Value Ref Range Status   Specimen Description URINE, CATHETERIZED  Final   Special Requests Immunocompromised  Final   Culture NO GROWTH 2 DAYS  Final   Report Status 12/02/2014 FINAL  Final  Culture, blood (routine x 2)     Status: None   Collection Time: 12/06/14  8:08 PM  Result Value Ref Range Status   Specimen Description BLOOD  Final   Special Requests NONE  Final   Culture  Setup Time   Final    GRAM POSITIVE COCCI IN BOTH AEROBIC AND ANAEROBIC BOTTLES CRITICAL RESULT CALLED TO, READ BACK BY AND VERIFIED WITH: JENNIFER BEZARD AT 4944 12/08/14.PMH CONFIRMED BY RWW    Culture   Final    STAPHYLOCOCCUS AURICULARIS IN BOTH AEROBIC AND ANAEROBIC BOTTLES    Report Status 12/11/2014 FINAL  Final   Organism ID, Bacteria STAPHYLOCOCCUS AURICULARIS  Final      Susceptibility   Staphylococcus auricularis - MIC*    CIPROFLOXACIN >=8 RESISTANT Resistant     ERYTHROMYCIN >=8 RESISTANT Resistant     GENTAMICIN <=0.5 SENSITIVE Sensitive     OXACILLIN >=4 RESISTANT Resistant     TETRACYCLINE <=1 SENSITIVE Sensitive     VANCOMYCIN 1 SENSITIVE Sensitive     CLINDAMYCIN <=0.25 SENSITIVE Sensitive     TRIMETH/SULFA Value in next row Sensitive      SENSITIVE<=20    LEVOFLOXACIN Value in next row Intermediate      INTERMEDIATE4    * STAPHYLOCOCCUS AURICULARIS  Culture, blood (routine x 2)     Status: None   Collection Time: 12/06/14  8:40 PM  Result Value Ref Range Status   Specimen Description BLOOD  Final   Special Requests NONE  Final   Culture NO GROWTH 5 DAYS  Final   Report Status 12/11/2014 FINAL  Final  Culture, blood (routine x 2)     Status: None   Collection Time: 12/08/14 11:40 AM  Result Value Ref Range  Status   Specimen Description BLOOD  Final   Special Requests Normal  Final   Culture NO GROWTH 6 DAYS  Final   Report Status 12/14/2014 FINAL  Final  Culture, blood (routine x 2)     Status: None  Collection Time: 12/08/14 11:49 AM  Result Value Ref Range Status   Specimen Description BLOOD  Final   Special Requests Normal  Final   Culture NO GROWTH 6 DAYS  Final   Report Status 12/14/2014 FINAL  Final  C difficile quick scan w PCR reflex ([facility] only)     Status: None   Collection Time: 12/14/14 10:11 AM  Result Value Ref Range Status   C Diff antigen NEGATIVE  Final   C Diff toxin NEGATIVE  Final   C Diff interpretation Negative for C. difficile  Final    Medications:  Scheduled:  . antiseptic oral rinse  7 mL Mouth Rinse BID  . insulin aspart  1-3 Units Subcutaneous Q6H  . methylPREDNISolone (SOLU-MEDROL) injection  60 mg Intravenous Once  . mometasone-formoterol  2 puff Inhalation BID  . pantoprazole (PROTONIX) IV  40 mg Intravenous Q12H  . potassium chloride  40 mEq Oral BID  . scopolamine  1 patch Transdermal Q72H  . thiamine IV  100 mg Intravenous Daily  . ziprasidone  10 mg Intramuscular QHS   Infusions:  . TPN (CLINIMIX) Adult without lytes 70 mL/hr at 12/17/14 0600  . TPN (CLINIMIX) Adult without lytes      Assessment: 69 yo male with progressive mantle cell lymphoma. Patient being treated for electrolyte imbalance and AMS. Patient currently on vancomycin for Staph auricularis bacteremia.   Goal of Therapy:  Resolution of Symptoms  Plan:   1. TPN/Electrolytes: Patient now receiving Clinimix without electrolytes at 49mL/hr.  Electrolytes within normal limits.  Will recheck in AM.    2.  Glucose levels ranged from 141-144.  Continue SSI orders.  No scheduled insulin required at this time.  Last bowel movement charted at 7/5.  Pharmacy will continue to monitor and adjust per consult.    Murrell Converse, PharmD Clinical Pharmacist 12/17/2014

## 2014-12-17 NOTE — Progress Notes (Signed)
Subjective:  UOP good but azotemia worsening. Had long discussion with pt's daugther. She wishes to proceed with HD to see if treating azotemia will improve pt's mental status.   Objective:  Vital signs in last 24 hours:  Temp:  [97.7 F (36.5 C)-97.8 F (36.6 C)] 97.8 F (36.6 C) (07/05 1206) Pulse Rate:  [48-105] 105 (07/05 1206) Resp:  [18-22] 18 (07/05 1206) BP: (110-125)/(58-67) 113/67 mmHg (07/05 1206) SpO2:  [100 %] 100 % (07/05 1204) Weight:  [63.5 kg (139 lb 15.9 oz)] 63.5 kg (139 lb 15.9 oz) (07/05 2595)  Weight change: -0.004 kg (-0.1 oz) Filed Weights   12/15/14 0500 12/16/14 0549 12/17/14 0614  Weight: 68.9 kg (151 lb 14.4 oz) 63.504 kg (140 lb) 63.5 kg (139 lb 15.9 oz)    Intake/Output: I/O last 3 completed shifts: In: -  Out: 6387 [Urine:3550; Emesis/NG output:750; Stool:450]   Intake/Output this shift:  Total I/O In: 2103 [TPN:2103] Out: 840 [Urine:650; Emesis/NG output:150; Stool:40]  Physical Exam: General: awake, ill appearing  Head: NG in place, oral mucosa dry  Eyes: Anicteric  Neck: Supple, trachea midline  Lungs:  Clear to auscultation normal effort  Heart: S1S2 no rubs  Abdomen:  No distension today, BS present, soft  Extremities: Trace LE edema  Neurologic: Lethargic but arousable to physical stimuli.  Skin: No acute rashes  Access:     Basic Metabolic Panel:  Recent Labs Lab 12/12/14 0535  12/13/14 0448  12/14/14 0502 12/14/14 1620 12/15/14 0529 12/15/14 1508 12/16/14 0524 12/16/14 1414 12/17/14 0453  NA 143  --  145  --  143 SEE COMMENTS 143 142 144  --  143  K 3.2*  < > 3.4*  < > 3.1* 3.5 3.1* 3.3* 3.0* 3.2* 3.8  CL 112*  --  111  --  112* 110 112* 114* 116*  --  123*  CO2 24  --  23  --  24 22 20* 20* 19*  --  16*  GLUCOSE 177*  --  192*  --  158* 182* 148* 138* 145*  --  157*  BUN 60*  --  64*  --  78* 83* 92* 99* 89*  --  103*  CREATININE 3.73*  < > 4.26*  --  4.91* 5.34* 5.50* 5.74* 6.00*  --  6.31*  CALCIUM 7.8*   --  8.7*  --  8.0* 8.0* 8.0* 8.2* 8.5*  --  8.5*  MG 2.1  --  2.2  --  2.3  --   --   --  1.8  --  1.9  PHOS 5.0*  --  6.2*  --  5.9*  --   --   --  4.5  --  3.7  < > = values in this interval not displayed.  Liver Function Tests:  Recent Labs Lab 12/12/14 0535 12/14/14 0502  AST 13* 12*  ALT 9* 9*  ALKPHOS 112 114  BILITOT 0.7 0.6  PROT 5.2* 5.3*  ALBUMIN 2.1* 2.1*   No results for input(s): LIPASE, AMYLASE in the last 168 hours. No results for input(s): AMMONIA in the last 168 hours.  CBC:  Recent Labs Lab 12/12/14 0535 12/13/14 0448 12/14/14 0502 12/14/14 1955 12/15/14 0529 12/16/14 0524 12/17/14 0453  WBC 6.4 11.1* 10.0  --  9.4 9.4 7.7  NEUTROABS 5.0 7.9* 8.0*  --  8.4* 7.6  --   HGB 7.9* 8.2* 7.3* 8.0* 7.5* 7.1* 6.9*  HCT 23.8* 25.1* 22.4* 23.4* 22.6* 22.1* 21.5*  MCV 87.7  87.7 87.7  --  84.7 85.9 86.8  PLT 16* 36* 36*  --  43* 59* 72*    Cardiac Enzymes: No results for input(s): CKTOTAL, CKMB, CKMBINDEX, TROPONINI in the last 168 hours.  BNP: Invalid input(s): POCBNP  CBG:  Recent Labs Lab 12/16/14 1148 12/16/14 1759 12/17/14 0006 12/17/14 0612 12/17/14 1203  GLUCAP 159* 113* 141* 146* 144*    Microbiology: Results for orders placed or performed during the hospital encounter of 11/29/14  MRSA PCR Screening     Status: None   Collection Time: 11/29/14 12:13 PM  Result Value Ref Range Status   MRSA by PCR NEGATIVE NEGATIVE Final    Comment:        The GeneXpert MRSA Assay (FDA approved for NASAL specimens only), is one component of a comprehensive MRSA colonization surveillance program. It is not intended to diagnose MRSA infection nor to guide or monitor treatment for MRSA infections.   Culture, blood (routine x 2)     Status: None   Collection Time: 11/29/14  1:19 PM  Result Value Ref Range Status   Specimen Description BLOOD  Final   Special Requests Immunocompromised  Final   Culture NO GROWTH 5 DAYS  Final   Report Status  12/04/2014 FINAL  Final  Urine culture     Status: None   Collection Time: 11/30/14 10:12 AM  Result Value Ref Range Status   Specimen Description URINE, CATHETERIZED  Final   Special Requests Immunocompromised  Final   Culture NO GROWTH 2 DAYS  Final   Report Status 12/02/2014 FINAL  Final  Culture, blood (routine x 2)     Status: None   Collection Time: 12/06/14  8:08 PM  Result Value Ref Range Status   Specimen Description BLOOD  Final   Special Requests NONE  Final   Culture  Setup Time   Final    GRAM POSITIVE COCCI IN BOTH AEROBIC AND ANAEROBIC BOTTLES CRITICAL RESULT CALLED TO, READ BACK BY AND VERIFIED WITH: JENNIFER BEZARD AT 4010 12/08/14.PMH CONFIRMED BY RWW    Culture   Final    STAPHYLOCOCCUS AURICULARIS IN BOTH AEROBIC AND ANAEROBIC BOTTLES    Report Status 12/11/2014 FINAL  Final   Organism ID, Bacteria STAPHYLOCOCCUS AURICULARIS  Final      Susceptibility   Staphylococcus auricularis - MIC*    CIPROFLOXACIN >=8 RESISTANT Resistant     ERYTHROMYCIN >=8 RESISTANT Resistant     GENTAMICIN <=0.5 SENSITIVE Sensitive     OXACILLIN >=4 RESISTANT Resistant     TETRACYCLINE <=1 SENSITIVE Sensitive     VANCOMYCIN 1 SENSITIVE Sensitive     CLINDAMYCIN <=0.25 SENSITIVE Sensitive     TRIMETH/SULFA Value in next row Sensitive      SENSITIVE<=20    LEVOFLOXACIN Value in next row Intermediate      INTERMEDIATE4    * STAPHYLOCOCCUS AURICULARIS  Culture, blood (routine x 2)     Status: None   Collection Time: 12/06/14  8:40 PM  Result Value Ref Range Status   Specimen Description BLOOD  Final   Special Requests NONE  Final   Culture NO GROWTH 5 DAYS  Final   Report Status 12/11/2014 FINAL  Final  Culture, blood (routine x 2)     Status: None   Collection Time: 12/08/14 11:40 AM  Result Value Ref Range Status   Specimen Description BLOOD  Final   Special Requests Normal  Final   Culture NO GROWTH 6 DAYS  Final   Report  Status 12/14/2014 FINAL  Final  Culture, blood  (routine x 2)     Status: None   Collection Time: 12/08/14 11:49 AM  Result Value Ref Range Status   Specimen Description BLOOD  Final   Special Requests Normal  Final   Culture NO GROWTH 6 DAYS  Final   Report Status 12/14/2014 FINAL  Final  C difficile quick scan w PCR reflex ([facility] only)     Status: None   Collection Time: 12/14/14 10:11 AM  Result Value Ref Range Status   C Diff antigen NEGATIVE  Final   C Diff toxin NEGATIVE  Final   C Diff interpretation Negative for C. difficile  Final    Coagulation Studies: No results for input(s): LABPROT, INR in the last 72 hours.  Urinalysis: No results for input(s): COLORURINE, LABSPEC, PHURINE, GLUCOSEU, HGBUR, BILIRUBINUR, KETONESUR, PROTEINUR, UROBILINOGEN, NITRITE, LEUKOCYTESUR in the last 72 hours.  Invalid input(s): APPERANCEUR    Imaging: Dg Abd 2 Views  12/16/2014   CLINICAL DATA:  Followup abdominal distension.  EXAM: ABDOMEN - 2 VIEW  COMPARISON:  Radiographs 12/15/2014  FINDINGS: Improved bowel gas pattern with less distended bowel. No free air. The left double-J ureteral stent is stable. The NG tube is stable.  IMPRESSION: Improved bowel gas pattern with less distended air-filled bowel.   Electronically Signed   By: Marijo Sanes M.D.   On: 12/16/2014 09:57   Dg Abd Portable 1v  12/16/2014   CLINICAL DATA:  Nasogastric tube placement  EXAM: PORTABLE ABDOMEN - 1 VIEW  COMPARISON:  Portable exam at 1157 hr compared to 12/16/2014 at 0940 hr  FINDINGS: Course of nasogastric tube is poorly visualized in the chest due to motion but appears to be coiled in the proximal stomach.  LEFT nephrostomy tube noted.  Bowel gas pattern poorly assessed due to motion.  RIGHT basilar atelectasis.  IMPRESSION: Nasogastric tube appears coiled in the proximal stomach.   Electronically Signed   By: Lavonia Dana M.D.   On: 12/16/2014 12:17     Medications:   . TPN (CLINIMIX) Adult without lytes 70 mL/hr at 12/17/14 0600   . antiseptic oral rinse  7  mL Mouth Rinse BID  . insulin aspart  1-3 Units Subcutaneous Q6H  . methylPREDNISolone (SOLU-MEDROL) injection  60 mg Intravenous Once  . mometasone-formoterol  2 puff Inhalation BID  . pantoprazole (PROTONIX) IV  40 mg Intravenous Q12H  . potassium chloride  40 mEq Oral BID  . scopolamine  1 patch Transdermal Q72H  . thiamine IV  100 mg Intravenous Daily  . ziprasidone  10 mg Intramuscular QHS   acetaminophen **OR** acetaminophen, albuterol, bethanechol, haloperidol lactate, heparin lock flush, heparin lock flush, magnesium hydroxide, metoprolol, morphine injection, ondansetron (ZOFRAN) IV, promethazine, sodium chloride, sodium chloride, sodium chloride, vancomycin, ziprasidone  Assessment/ Plan:  46 AAM with progressive mantle cell lymphoma, RICE chemo in 11/2014, hx left sided hydronephrosis and hydroureter. S/p ureteral stent placement    1.  Acute renal failure due to ATN. 2.  Sepsis with klebsiella oxytoca. 3.  Hypernatremia, resolved 4.  Hypokalemia. 5.  Anemia unspecified D64.9  Plan:   Azotemia worsening, and this is likley effecting pt's mental status. Had a long discussion with pt's daughter.   She wishes to proceed with a trial of dialysis to see if this will help his mental status. This is a reasonable approach for now.   Long term however pt would not make a good long term dialysis candidate given his current  medical condition.  LOS: [place], Curry Dulski 7/5/20161:28 PM

## 2014-12-18 ENCOUNTER — Inpatient Hospital Stay: Payer: Medicare Other

## 2014-12-18 DIAGNOSIS — K5981 Ogilvie syndrome: Secondary | ICD-10-CM | POA: Insufficient documentation

## 2014-12-18 DIAGNOSIS — K598 Other specified functional intestinal disorders: Secondary | ICD-10-CM

## 2014-12-18 DIAGNOSIS — K599 Functional intestinal disorder, unspecified: Secondary | ICD-10-CM

## 2014-12-18 LAB — BASIC METABOLIC PANEL
ANION GAP: 7 (ref 5–15)
Anion gap: 10 (ref 5–15)
BUN: 116 mg/dL — AB (ref 6–20)
BUN: 86 mg/dL — ABNORMAL HIGH (ref 6–20)
CHLORIDE: 120 mmol/L — AB (ref 101–111)
CO2: 14 mmol/L — AB (ref 22–32)
CO2: 22 mmol/L (ref 22–32)
CREATININE: 4.89 mg/dL — AB (ref 0.61–1.24)
Calcium: 8.8 mg/dL — ABNORMAL LOW (ref 8.9–10.3)
Calcium: 8.3 mg/dL — ABNORMAL LOW (ref 8.9–10.3)
Chloride: 112 mmol/L — ABNORMAL HIGH (ref 101–111)
Creatinine, Ser: 6.39 mg/dL — ABNORMAL HIGH (ref 0.61–1.24)
GFR calc Af Amer: 13 mL/min — ABNORMAL LOW (ref >60)
GFR calc non Af Amer: 8 mL/min — ABNORMAL LOW (ref >60)
GFR calc non Af Amer: 11 mL/min — ABNORMAL LOW (ref >60)
GFR, EST AFRICAN AMERICAN: 9 mL/min — AB (ref >60)
Glucose, Bld: 137 mg/dL — ABNORMAL HIGH (ref 65–99)
Glucose, Bld: 169 mg/dL — ABNORMAL HIGH (ref 65–99)
POTASSIUM: 3.8 mmol/L (ref 3.5–5.1)
Potassium: 3.8 mmol/L (ref 3.5–5.1)
Sodium: 144 mmol/L (ref 135–145)
Sodium: 141 mmol/L (ref 135–145)

## 2014-12-18 LAB — VANCOMYCIN, RANDOM: Vancomycin Rm: 24 ug/mL

## 2014-12-18 LAB — GLUCOSE, CAPILLARY
GLUCOSE-CAPILLARY: 138 mg/dL — AB (ref 65–99)
Glucose-Capillary: 166 mg/dL — ABNORMAL HIGH (ref 65–99)
Glucose-Capillary: 143 mg/dL — ABNORMAL HIGH (ref 65–99)

## 2014-12-18 LAB — RENAL FUNCTION PANEL
ALBUMIN: 2.2 g/dL — AB (ref 3.5–5.0)
ANION GAP: 9 (ref 5–15)
BUN: 90 mg/dL — ABNORMAL HIGH (ref 6–20)
CO2: 20 mmol/L — ABNORMAL LOW (ref 22–32)
Calcium: 8.1 mg/dL — ABNORMAL LOW (ref 8.9–10.3)
Chloride: 109 mmol/L (ref 101–111)
Creatinine, Ser: 5.11 mg/dL — ABNORMAL HIGH (ref 0.61–1.24)
GFR, EST AFRICAN AMERICAN: 12 mL/min — AB (ref >60)
GFR, EST NON AFRICAN AMERICAN: 11 mL/min — AB (ref >60)
Glucose, Bld: 133 mg/dL — ABNORMAL HIGH (ref 65–99)
PHOSPHORUS: 2.3 mg/dL — AB (ref 2.5–4.6)
POTASSIUM: 4.2 mmol/L (ref 3.5–5.1)
SODIUM: 138 mmol/L (ref 135–145)

## 2014-12-18 LAB — CBC WITH DIFFERENTIAL/PLATELET
Band Neutrophils: 1 % (ref 0–10)
Basophils Absolute: 0 10*3/uL (ref 0.0–0.1)
Basophils Relative: 0 % (ref 0–1)
Blasts: 0 %
Eosinophils Absolute: 0 10*3/uL (ref 0.0–0.7)
Eosinophils Relative: 0 % (ref 0–5)
HCT: 19.9 % — ABNORMAL LOW (ref 40.0–52.0)
Hemoglobin: 6.5 g/dL — ABNORMAL LOW (ref 13.0–18.0)
Lymphocytes Relative: 9 % — ABNORMAL LOW (ref 12–46)
Lymphs Abs: 0.7 10*3/uL (ref 0.7–4.0)
MCH: 27.9 pg (ref 26.0–34.0)
MCHC: 33 g/dL (ref 32.0–36.0)
MCV: 84.7 fL (ref 80.0–100.0)
Metamyelocytes Relative: 1 %
Monocytes Absolute: 0.8 10*3/uL (ref 0.1–1.0)
Monocytes Relative: 10 % (ref 3–12)
Myelocytes: 2 %
Neutro Abs: 6.5 10*3/uL (ref 1.7–7.7)
Neutrophils Relative %: 77 % (ref 43–77)
Other: 0 %
Platelets: 76 10*3/uL — ABNORMAL LOW (ref 150–440)
Promyelocytes Absolute: 0 %
RBC: 2.34 MIL/uL — ABNORMAL LOW (ref 4.40–5.90)
RDW: 19 % — ABNORMAL HIGH (ref 11.5–14.5)
Smear Review: DECREASED
WBC: 8 10*3/uL (ref 3.8–10.6)
nRBC: 0 /100 WBC

## 2014-12-18 LAB — CBC
HEMATOCRIT: 20.9 % — AB (ref 40.0–52.0)
HEMOGLOBIN: 6.8 g/dL — AB (ref 13.0–18.0)
MCH: 27.9 pg (ref 26.0–34.0)
MCHC: 32.5 g/dL (ref 32.0–36.0)
MCV: 85.9 fL (ref 80.0–100.0)
Platelets: 84 10*3/uL — ABNORMAL LOW (ref 150–440)
RBC: 2.43 MIL/uL — ABNORMAL LOW (ref 4.40–5.90)
RDW: 19 % — AB (ref 11.5–14.5)
WBC: 8.9 10*3/uL (ref 3.8–10.6)

## 2014-12-18 LAB — ALT: ALT: 15 U/L — ABNORMAL LOW (ref 17–63)

## 2014-12-18 LAB — PREPARE RBC (CROSSMATCH)

## 2014-12-18 LAB — MAGNESIUM: Magnesium: 1.7 mg/dL (ref 1.7–2.4)

## 2014-12-18 LAB — PHOSPHORUS: Phosphorus: 2.2 mg/dL — ABNORMAL LOW (ref 2.5–4.6)

## 2014-12-18 LAB — VANCOMYCIN, TROUGH: Vancomycin Tr: 17 ug/mL (ref 10–20)

## 2014-12-18 MED ORDER — ALTEPLASE 2 MG IJ SOLR: 2.0000 mg | Freq: Once | INTRAMUSCULAR | Status: DC | PRN

## 2014-12-18 MED ORDER — SODIUM CHLORIDE 0.9 % IV SOLN: 100.0000 mL | INTRAVENOUS | Status: DC | PRN

## 2014-12-18 MED ORDER — NEPRO/CARBSTEADY PO LIQD: 237.0000 mL | ORAL | Status: DC | PRN

## 2014-12-18 MED ORDER — POTASSIUM CHLORIDE 20 MEQ PO PACK
20.0000 meq | PACK | Freq: Two times a day (BID) | ORAL | Status: DC
  Administered 2014-12-18 – 2014-12-26 (×16): 20 meq via ORAL
  Filled 2014-12-18 (×17): qty 1

## 2014-12-18 MED ORDER — PENTAFLUOROPROP-TETRAFLUOROETH EX AERO: 1.0000 "application " | INHALATION_SPRAY | CUTANEOUS | Status: DC | PRN

## 2014-12-18 MED ORDER — VITAL 1.5 CAL PO LIQD: 1000.0000 mL | ORAL | Status: DC

## 2014-12-18 MED ORDER — LIDOCAINE HCL (PF) 1 % IJ SOLN
5.0000 mL | INTRAMUSCULAR | Status: DC | PRN
  Filled 2014-12-18: qty 5

## 2014-12-18 MED ORDER — TRACE MINERALS CR-CU-MN-SE-ZN 10-1000-500-60 MCG/ML IV SOLN
INTRAVENOUS | Status: AC
  Administered 2014-12-18: via INTRAVENOUS
  Filled 2014-12-18 (×3): qty 1680

## 2014-12-18 MED ORDER — HEPARIN SODIUM (PORCINE) 1000 UNIT/ML DIALYSIS: 1000.0000 [IU] | INTRAMUSCULAR | Status: DC | PRN

## 2014-12-18 MED ORDER — FREE WATER: 25.0000 mL | Status: DC

## 2014-12-18 MED ORDER — LIDOCAINE-PRILOCAINE 2.5-2.5 % EX CREA
1.0000 "application " | TOPICAL_CREAM | CUTANEOUS | Status: DC | PRN
  Filled 2014-12-18: qty 5

## 2014-12-18 MED ORDER — ALTEPLASE 2 MG IJ SOLR: 2.0000 mg | Freq: Once | INTRAMUSCULAR | Status: AC | PRN

## 2014-12-18 MED ORDER — SODIUM CHLORIDE 0.9 % IV SOLN
Freq: Once | INTRAVENOUS | Status: AC
  Administered 2014-12-18: 10:00:00 via INTRAVENOUS

## 2014-12-18 MED ORDER — FAT EMULSION 20 % IV EMUL
500.0000 mL | INTRAVENOUS | Status: AC
  Administered 2014-12-19: 500 mL via INTRAVENOUS
  Filled 2014-12-18: qty 500

## 2014-12-18 MED ORDER — JEVITY 1.2 CAL PO LIQD: 1000.0000 mL | ORAL | Status: DC

## 2014-12-18 MED ORDER — HEPARIN SODIUM (PORCINE) 1000 UNIT/ML IJ SOLN
1000.0000 [IU] | INTRAMUSCULAR | Status: DC | PRN
  Administered 2014-12-21: 3600 [IU] via INTRAVENOUS
  Filled 2014-12-18 (×2): qty 1

## 2014-12-18 MED ORDER — SODIUM CHLORIDE 0.9 % IV SOLN: Freq: Once | INTRAVENOUS | Status: DC

## 2014-12-18 NOTE — Progress Notes (Addendum)
Subjective: Pt denies abdominal pain or nausea.  Abdomen less distended.  Objective: Vital signs in last 24 hours: Temp:  [97.7 F (36.5 C)-98.6 F (37 C)] 98.1 F (36.7 C) (07/06 0911) Pulse Rate:  [103-126] 123 (07/06 0911) Resp:  [17-27] 22 (07/06 0911) BP: (90-113)/(55-81) 101/81 mmHg (07/06 0911) SpO2:  [100 %] 100 % (07/06 0911) Weight:  [138 lb 9.6 oz (62.869 kg)-139 lb 15.9 oz (63.5 kg)] 138 lb 9.9 oz (62.876 kg) (07/06 0550) Last BM Date: 12/17/14 No LMP for male patient. Body mass index is 18.8 kg/(m^2). General:  Alert, Answers questions appropriately today Head:  Normocephalic and atraumatic. Eyes:  +exopthalmos.  Sclera clear, no icterus.   Conjunctiva pink. Mouth:  No deformity or lesions, oropharynx pink & moist. Neck:  Supple; no masses or thyromegaly. Heart:  Regular rate and rhythm; no murmurs, clicks, rubs, or gallops. Abdomen:   +BS x4, soft, mildly distended, nontender  No masses, hepatosplenomegaly or hernias noted.  No guarding or rebound tenderness.   Msk:  Symmetrical Pulses:  Normal pulses noted. Extremities:  No edema.  No cyanosis Neurologic:  Nonverbal, resting quietly Skin:  Intact without significant lesions or rashes.  Intake/Output from previous day: 07/05 0701 - 07/06 0700 In: 3337 [QQP:6195] Out: 2135 [Urine:1850; Emesis/NG output:245; Stool:40]  Lab Results:  Recent Labs  12/17/14 0453 12/17/14 2015 12/18/14 0637  WBC 7.7 8.3 8.0  HGB 6.9* 7.0* 6.5*  HCT 21.5* 21.7* 19.9*  PLT 72* 86* 76*   BMET  Recent Labs  12/17/14 0453 12/17/14 2015 12/18/14 0637  NA 143 144 141  K 3.8 3.8 3.8  CL 123* 120* 112*  CO2 16* 14* 22  GLUCOSE 157* 137* 169*  BUN 103* 116* 86*  CREATININE 6.31* 6.39* 4.89*  CALCIUM 8.5* 8.8* 8.3*   Studies/Results: Dg Abd 2 Views  2015/01/02   CLINICAL DATA:  Followup abdominal distension.  EXAM: ABDOMEN - 2 VIEW  COMPARISON:  Radiographs 12/15/2014  FINDINGS: Improved bowel gas pattern with less  distended bowel. No free air. The left double-J ureteral stent is stable. The NG tube is stable.  IMPRESSION: Improved bowel gas pattern with less distended air-filled bowel.   Electronically Signed   By: Marijo Sanes M.D.   On: 2015-01-02 09:57   Dg Abd Portable 1v  01/02/15   CLINICAL DATA:  Nasogastric tube placement  EXAM: PORTABLE ABDOMEN - 1 VIEW  COMPARISON:  Portable exam at 1157 hr compared to 2015/01/02 at 0940 hr  FINDINGS: Course of nasogastric tube is poorly visualized in the chest due to motion but appears to be coiled in the proximal stomach.  LEFT nephrostomy tube noted.  Bowel gas pattern poorly assessed due to motion.  RIGHT basilar atelectasis.  IMPRESSION: Nasogastric tube appears coiled in the proximal stomach.   Electronically Signed   By: Lavonia Dana M.D.   On: 01-02-2015 12:17    Assessment: Colonic ileus & ileus:  Much improved with NGT/rectal tube.  Discussed care with Suzy Bouchard, Dietician. Plan: #1  OK for trial of tube feedings @ slow rate with residual checks #2 Check tube placement with KUB prior to beginning feeds  The care of MERVIN RAMIRES will be discussed in direct collaboration with Dr Lucilla Lame, Attending Gastroenterologist.  LOS: 19 days  Vickey Huger  12/18/2014, 9:39 AM [facility]  [place], [place] [address] Phone: [phone] Fax : [phone]   4:55 PM Addendum:  Discussed KUB results with Dr Darvin Neighbours & tube feeds held  for now. Vickey Huger, NP

## 2014-12-18 NOTE — Progress Notes (Signed)
ANTIBIOTIC CONSULT NOTE - FOLLOW UP  Pharmacy Consult for Vancomycin Indication: Staph auricularis bacteremia  No Known Allergies  Patient Measurements: Height: 6' (182.9 cm) Weight: 142 lb 13.7 oz (64.8 kg) IBW/kg (Calculated) : 77.6   Vital Signs: Temp: 98 F (36.7 C) (07/06 1608) Temp Source: Oral (07/06 1504) BP: 96/59 mmHg (07/06 1608) Pulse Rate: 92 (07/06 1608) Intake/Output from previous day: 07/05 0701 - 07/06 0700 In: 3337 [EYC:1448] Out: 2135 [Urine:1850; Emesis/NG output:245; Stool:40] Intake/Output from this shift: Total I/O In: 1311 [Blood:396; NG/GT:75; TPN:840] Out: 250 [Urine:500]  Labs:  Recent Labs  12/17/14 2015 12/18/14 0637 12/18/14 1353  WBC 8.3 8.0 8.9  HGB 7.0* 6.5* 6.8*  PLT 86* 76* 84*  CREATININE 6.39* 4.89* 5.11*   Estimated Creatinine Clearance: 12.9 mL/min (by C-G formula based on Cr of 5.11).  Recent Labs  12/17/14 0453 12/17/14 1726 12/18/14 0637 12/18/14 1601  VANCOTROUGH 26*  --   --  17  VANCORANDOM  --  21 24  --      Microbiology: Recent Results (from the past 720 hour(s))  Culture, blood (single)     Status: None   Collection Time: 11/29/14 11:09 AM  Result Value Ref Range Status   Specimen Description BLOOD  Final   Special Requests NONE  Final   Culture  Setup Time   Final    GRAM NEGATIVE RODS AEROBIC BOTTLE ONLY CRITICAL RESULT CALLED TO, READ BACK BY AND VERIFIED WITH: SANDRA VORBA ON 11/30/14 AT 0825 BY JEF    Culture KLEBSIELLA OXYTOCA AEROBIC BOTTLE ONLY   Final   Report Status 12/04/2014 FINAL  Final   Organism ID, Bacteria KLEBSIELLA OXYTOCA  Final      Susceptibility   Klebsiella oxytoca - MIC*    AMPICILLIN >=32 RESISTANT Resistant     CEFTAZIDIME <=1 SENSITIVE Sensitive     CEFAZOLIN >=64 RESISTANT Resistant     CEFTRIAXONE <=1 SENSITIVE Sensitive     CIPROFLOXACIN <=0.25 SENSITIVE Sensitive     GENTAMICIN <=1 SENSITIVE Sensitive     IMIPENEM <=0.25 SENSITIVE Sensitive     TRIMETH/SULFA  <=20 SENSITIVE Sensitive     CEFOXITIN <=4 SENSITIVE Sensitive     PIP/TAZO Value in next row Resistant      RESISTANT128    * KLEBSIELLA OXYTOCA  Urine culture     Status: None   Collection Time: 11/29/14 11:22 AM  Result Value Ref Range Status   Specimen Description URINE, CLEAN CATCH  Final   Special Requests NONE  Final   Culture >=100,000 COLONIES/mL KLEBSIELLA OXYTOCA  Final   Report Status 12/01/2014 FINAL  Final   Organism ID, Bacteria KLEBSIELLA OXYTOCA  Final      Susceptibility   Klebsiella oxytoca - MIC*    AMPICILLIN >=32 RESISTANT Resistant     CEFAZOLIN >=64 RESISTANT Resistant     CEFTRIAXONE 4 SENSITIVE Sensitive     CIPROFLOXACIN <=0.25 SENSITIVE Sensitive     GENTAMICIN <=1 SENSITIVE Sensitive     IMIPENEM <=0.25 SENSITIVE Sensitive     NITROFURANTOIN <=16 SENSITIVE Sensitive     TRIMETH/SULFA <=20 SENSITIVE Sensitive     CEFOXITIN <=4 SENSITIVE Sensitive     * >=100,000 COLONIES/mL KLEBSIELLA OXYTOCA  MRSA PCR Screening     Status: None   Collection Time: 11/29/14 12:13 PM  Result Value Ref Range Status   MRSA by PCR NEGATIVE NEGATIVE Final    Comment:        The GeneXpert MRSA Assay (FDA approved for NASAL  specimens only), is one component of a comprehensive MRSA colonization surveillance program. It is not intended to diagnose MRSA infection nor to guide or monitor treatment for MRSA infections.   Culture, blood (routine x 2)     Status: None   Collection Time: 11/29/14  1:19 PM  Result Value Ref Range Status   Specimen Description BLOOD  Final   Special Requests Immunocompromised  Final   Culture NO GROWTH 5 DAYS  Final   Report Status 12/04/2014 FINAL  Final  Urine culture     Status: None   Collection Time: 11/30/14 10:12 AM  Result Value Ref Range Status   Specimen Description URINE, CATHETERIZED  Final   Special Requests Immunocompromised  Final   Culture NO GROWTH 2 DAYS  Final   Report Status 12/02/2014 FINAL  Final  Culture, blood  (routine x 2)     Status: None   Collection Time: 12/06/14  8:08 PM  Result Value Ref Range Status   Specimen Description BLOOD  Final   Special Requests NONE  Final   Culture  Setup Time   Final    GRAM POSITIVE COCCI IN BOTH AEROBIC AND ANAEROBIC BOTTLES CRITICAL RESULT CALLED TO, READ BACK BY AND VERIFIED WITH: JENNIFER BEZARD AT 3716 12/08/14.PMH CONFIRMED BY RWW    Culture   Final    STAPHYLOCOCCUS AURICULARIS IN BOTH AEROBIC AND ANAEROBIC BOTTLES    Report Status 12/11/2014 FINAL  Final   Organism ID, Bacteria STAPHYLOCOCCUS AURICULARIS  Final      Susceptibility   Staphylococcus auricularis - MIC*    CIPROFLOXACIN >=8 RESISTANT Resistant     ERYTHROMYCIN >=8 RESISTANT Resistant     GENTAMICIN <=0.5 SENSITIVE Sensitive     OXACILLIN >=4 RESISTANT Resistant     TETRACYCLINE <=1 SENSITIVE Sensitive     VANCOMYCIN 1 SENSITIVE Sensitive     CLINDAMYCIN <=0.25 SENSITIVE Sensitive     TRIMETH/SULFA Value in next row Sensitive      SENSITIVE<=20    LEVOFLOXACIN Value in next row Intermediate      INTERMEDIATE4    * STAPHYLOCOCCUS AURICULARIS  Culture, blood (routine x 2)     Status: None   Collection Time: 12/06/14  8:40 PM  Result Value Ref Range Status   Specimen Description BLOOD  Final   Special Requests NONE  Final   Culture NO GROWTH 5 DAYS  Final   Report Status 12/11/2014 FINAL  Final  Culture, blood (routine x 2)     Status: None   Collection Time: 12/08/14 11:40 AM  Result Value Ref Range Status   Specimen Description BLOOD  Final   Special Requests Normal  Final   Culture NO GROWTH 6 DAYS  Final   Report Status 12/14/2014 FINAL  Final  Culture, blood (routine x 2)     Status: None   Collection Time: 12/08/14 11:49 AM  Result Value Ref Range Status   Specimen Description BLOOD  Final   Special Requests Normal  Final   Culture NO GROWTH 6 DAYS  Final   Report Status 12/14/2014 FINAL  Final  C difficile quick scan w PCR reflex ([facility] only)     Status: None    Collection Time: 12/14/14 10:11 AM  Result Value Ref Range Status   C Diff antigen NEGATIVE  Final   C Diff toxin NEGATIVE  Final   C Diff interpretation Negative for C. difficile  Final    Anti-infectives    Start     Dose/Rate  Route Frequency Ordered Stop   12/15/14 0630  vancomycin (VANCOCIN) IVPB 750 mg/150 ml premix     750 mg 150 mL/hr over 60 Minutes Intravenous  Once 12/15/14 0616 12/15/14 0726   12/14/14 1300  vancomycin (VANCOCIN) IVPB 750 mg/150 ml premix  Status:  Discontinued     750 mg 150 mL/hr over 60 Minutes Intravenous Every 48 hours 12/13/14 1107 12/14/14 0604   12/14/14 0603  vancomycin (VANCOCIN) IVPB 750 mg/150 ml premix     750 mg 150 mL/hr over 60 Minutes Intravenous Daily PRN 12/14/14 0604     12/09/14 0900  erythromycin 250 mg in sodium chloride 0.9 % 100 mL IVPB  Status:  Discontinued     250 mg 100 mL/hr over 60 Minutes Intravenous 3 times per day 12/09/14 0859 12/13/14 1054   12/08/14 1100  metroNIDAZOLE (FLAGYL) IVPB 500 mg  Status:  Discontinued     500 mg 100 mL/hr over 60 Minutes Intravenous Every 8 hours 12/08/14 0958 12/11/14 1048   12/08/14 0900  vancomycin (VANCOCIN) IVPB 750 mg/150 ml premix  Status:  Discontinued     750 mg 150 mL/hr over 60 Minutes Intravenous Every 24 hours 12/07/14 1102 12/12/14 0955   12/07/14 1030  cefTAZidime (FORTAZ) 2 g in dextrose 5 % 50 mL IVPB  Status:  Discontinued     2 g 100 mL/hr over 30 Minutes Intravenous Every 24 hours 12/07/14 1021 12/11/14 1048   12/07/14 0700  vancomycin (VANCOCIN) IVPB 1000 mg/200 mL premix  Status:  Discontinued     1,000 mg 200 mL/hr over 60 Minutes Intravenous Every 24 hours 12/06/14 2225 12/07/14 1102   12/06/14 2200  piperacillin-tazobactam (ZOSYN) IVPB 4.5 g  Status:  Discontinued     4.5 g 200 mL/hr over 30 Minutes Intravenous 3 times per day 12/06/14 1946 12/07/14 1011   12/06/14 2000  vancomycin (VANCOCIN) IVPB 1000 mg/200 mL premix     1,000 mg 200 mL/hr over 60 Minutes  Intravenous STAT 12/06/14 1947 12/06/14 2113   12/06/14 2000  cefTRIAXone (ROCEPHIN) 1 g in dextrose 5 % 50 mL IVPB - Premix  Status:  Discontinued     1 g 100 mL/hr over 30 Minutes Intravenous Every 24 hours 12/06/14 1959 12/07/14 1011   12/06/14 1945  piperacillin-tazobactam (ZOSYN) IVPB 3.375 g  Status:  Discontinued     3.375 g 100 mL/hr over 30 Minutes Intravenous 4 times per day 12/06/14 1940 12/06/14 2007   12/06/14 1945  vancomycin (VANCOCIN) IVPB 1000 mg/200 mL premix  Status:  Discontinued     1,000 mg 200 mL/hr over 60 Minutes Intravenous Every 12 hours 12/06/14 1940 12/06/14 2008   12/01/14 0945  piperacillin-tazobactam (ZOSYN) IVPB 3.375 g  Status:  Discontinued     3.375 g 12.5 mL/hr over 240 Minutes Intravenous 3 times per day 12/01/14 0936 12/03/14 1337   11/30/14 1000  cefTRIAXone (ROCEPHIN) 2 g in dextrose 5 % 50 mL IVPB - Premix  Status:  Discontinued    Comments:  Entered per MD progress note   2 g 100 mL/hr over 30 Minutes Intravenous Every 24 hours 11/29/14 2157 12/06/14 1940   11/29/14 1415  cefTRIAXone (ROCEPHIN) 1 g in dextrose 5 % 50 mL IVPB - Premix     1 g 100 mL/hr over 30 Minutes Intravenous  Once 11/29/14 1406 11/29/14 1452   11/29/14 1414  cefTRIAXone (ROCEPHIN) 20 MG/ML IVPB 50 mL    Comments:  MONAR, NELLIE: cabinet override  11/29/14 1414 11/29/14 1424      Assessment: Patient with renal failure that may require renal replacement therapy on vancomycin for Staph auricularis bacteremia with vancomycin trough above goal. Last dose of vancomycin was 7/3.   Random vancomycin level 7/5 at 17:30: 7mcg/ml, however patient taken to dialysis that evening. Per RN, patient had about 1.5 hrs of HD.  Random vancomycin level 7/6 at 0637: 24 mcg/ml  Goal of Therapy:  Vancomycin trough level 15-20 mcg/ml  Plan:  Random vancomycin level 7/6 at 0637: 24 mcg/ml.  Per notes, patient is to receive HD today (2.5 hours per orders).   Will recheck random  vancomycin level after dialysis today at 1600 and plan to redose if level is < 15.   Unclear what the plan is for dialysis at this point. Will need to follow up with nephrology in regards to plans continued HD and adjust dosing accordingly.  7/6:  Vanc level @ 16:00 = 17 mcg/mL Will not redose Vanc at this time.   Will need to reassess once HD schedule is established.   Pharmacy to follow per consult.   Xaria Judon D 12/18/2014,6:59 PM

## 2014-12-18 NOTE — Progress Notes (Signed)
Nutrition Follow-up  DOCUMENTATION CODES:  Severe malnutrition in context of acute illness/injury  INTERVENTION:   (Coordination of Care): discussed current TPN with MD Kolloru, phosphorus wdl yesterday, received HD yesterday and plan for today. MD ok with changing TPN to electrolyte containing formula which will better meet nutritional needs. Discussed current GI function and possible initiation of EN with Vickey Huger, GI NP. Received verbal order to start low rate TF PN: recommend 5%AA/20%Dextrose at rate of 70 ml/hr providing 1478 kcals, 84 g of protein, 1680 mL of fluid. Discussed change of formula with Crystal, Pharmacist. If unable to tolerate TF, recommend adding lipids again tomorrow or next day for additional kcals via lipids. EN: recommend starting Vital 1.5 TF at rate of 15 ml/hr; no titration rate recommended at present until pt demonstrating tolerance of TF. NP gave orders for strict aspiration precautions as well as repeat abdominal xray to confirm NG placement prior to initiate of TF as pt at high risk for aspiration.   NUTRITION DIAGNOSIS:  Inadequate oral intake related to inability to eat as evidenced by NPO status.  Being addressed via TPN, trial of EN   GOAL:  Patient will meet greater than or equal to 90% of their needs   MONITOR:   (PN, Energy Intake, Anthropometrics, Electrolyte/Renal Profile, Glucose Profile, Digestive System)  REASON FOR ASSESSMENT:  Consult Enteral/tube feeding initiation and management  ASSESSMENT:  Pt received HD yesterday, plan for HD again today.   PN: 5%AA/15%Dextrose without electrolytes at rate of 70 ml/hr  Diet Order: NPO  Gastrointestinal Profile: abdomen softer, less distended,  NG with 245 mL (less output), flexiseal with 40 mL, ileus improved , no abdominal pain, no N/V Last BM: rectal tube  Urine Volume: UOP 1850 mL  Medications: reviewed  Electrolyte/Renal Profile and Glucose Profile:   Recent Labs Lab  12/14/14 0502  12/16/14 0524  12/17/14 0453 12/17/14 2015 12/18/14 0637  NA 143  < > 144  --  143 144 141  K 3.1*  < > 3.0*  < > 3.8 3.8 3.8  CL 112*  < > 116*  --  123* 120* 112*  CO2 24  < > 19*  --  16* 14* 22  BUN 78*  < > 89*  --  103* 116* 86*  CREATININE 4.91*  < > 6.00*  --  6.31* 6.39* 4.89*  CALCIUM 8.0*  < > 8.5*  --  8.5* 8.8* 8.3*  MG 2.3  --  1.8  --  1.9  --  1.7  PHOS 5.9*  --  4.5  --  3.7  --   --   GLUCOSE 158*  < > 145*  --  157* 137* 169*  < > = values in this interval not displayed. Protein Profile:   Recent Labs Lab 12/12/14 0535 12/14/14 0502  ALBUMIN 2.1* 2.1*     Weight Trend since Admission: [facility] Weights   12/17/14 1928 12/17/14 2150 12/18/14 0550  Weight: 139 lb 15.9 oz (63.5 kg) 138 lb 9.6 oz (62.869 kg) 138 lb 9.9 oz (62.876 kg)      Height:  Ht Readings from Last 1 Encounters:  11/29/14 6' (1.829 m)    Weight:  Wt Readings from Last 1 Encounters:  12/18/14 138 lb 9.9 oz (62.876 kg)     BMI:  Body mass index is 18.8 kg/(m^2).  Estimated Nutritional Needs: Re-estimated today  Kcal:  2068-2412 kcals (BEE 1436, 1.2 AF, 1.2-1.4 IF) using current wt of 62.9 kg  Protein:  76-95 (1.2-1.5 g/kg)   Fluid:  1575-1890 mL (25-30 ml/kg)   Skin:     Diet Order:  Diet NPO time specified TPN (CLINIMIX) Adult without lytes .TPN (CLINIMIX-E) Adult  EDUCATION NEEDS:  No education needs identified at this time   [place], [place], LDN [phone] Pager

## 2014-12-18 NOTE — Progress Notes (Signed)
[facility]  Daily Progress Note   Subjective  - * No surgery found *  Temp cath placed yesterday.  Got HD last night and seen at end of HD treatment today.  Tolerating well.  Catheter worked without issues.  Objective Filed Vitals:   12/18/14 1500 12/18/14 1504 12/18/14 1518 12/18/14 1608  BP: 82/54 82/54 95/60  96/59  Pulse: 106 109 111 92  Temp:  98 F (36.7 C)  98 F (36.7 C)  TempSrc:  Oral    Resp: 23  19 17   Height:      Weight:   142 lb 13.7 oz (64.8 kg)   SpO2:        Intake/Output Summary (Last 24 hours) at 12/18/14 1629 Last data filed at 12/18/14 1504  Gross per 24 hour  Intake   1630 ml  Output   1045 ml  Net    585 ml    PULM  CTAB CV  RRR VASC  Catheter in place, C/D/I  Laboratory CBC    Component Value Date/Time   WBC 8.9 12/18/2014 1353   WBC 6.4 07/17/2014 1121   HGB 6.8* 12/18/2014 1353   HGB 11.8* 07/17/2014 1121   HCT 20.9* 12/18/2014 1353   HCT 35.0* 07/17/2014 1121   PLT 84* 12/18/2014 1353   PLT 263 07/17/2014 1121    BMET    Component Value Date/Time   NA 138 12/18/2014 1353   NA 141 07/17/2014 1121   K 4.2 12/18/2014 1353   K 2.8* 07/17/2014 1121   CL 109 12/18/2014 1353   CL 100 07/17/2014 1121   CO2 20* 12/18/2014 1353   CO2 32 07/17/2014 1121   GLUCOSE 133* 12/18/2014 1353   GLUCOSE 105* 07/17/2014 1121   BUN 90* 12/18/2014 1353   BUN 9 07/17/2014 1121   CREATININE 5.11* 12/18/2014 1353   CREATININE 1.09 07/17/2014 1121   CALCIUM 8.1* 12/18/2014 1353   CALCIUM 8.9 07/17/2014 1121   GFRNONAA 11* 12/18/2014 1353   GFRNONAA >60 02/11/2014 0851   GFRAA 12* 12/18/2014 1353   GFRAA >60 02/11/2014 0851    Assessment/Planning: POD # 1 s/p temp cath placement for ARF   Still with AMS despite two treatments with HD.    OK to use catheter as needed  If renal function does not improve and needs Permcath will be happy to place next week    Caleb Francis  12/18/2014, 4:29 PM

## 2014-12-18 NOTE — Care Management (Signed)
Family not available at present to pursue discussion regarding LTAC.  Patient has a temporary dialysis catheter in right groin and received 1.5 hours of hemodialysis 7/6.  Hgb down to 6.5 and will receive unit of packed cells during dialysis today.  Groin catheter will now prevent any ambulation attempts.

## 2014-12-18 NOTE — Progress Notes (Signed)
ANTIBIOTIC CONSULT NOTE - FOLLOW UP  Pharmacy Consult for Vancomycin Indication: Staph auricularis bacteremia  No Known Allergies  Patient Measurements: Height: 6' (182.9 cm) Weight: 138 lb 9.9 oz (62.876 kg) IBW/kg (Calculated) : 77.6   Vital Signs: Temp: 98 F (36.7 C) (07/06 1302) Temp Source: Oral (07/06 1302) BP: 93/66 mmHg (07/06 1302) Pulse Rate: 112 (07/06 1302) Intake/Output from previous day: 07/05 0701 - 07/06 0700 In: 3337 [FOY:[id number]] Out: 2135 [Urine:1850; Emesis/NG output:245; Stool:40] Intake/Output from this shift:    Labs:  Recent Labs  12/17/14 0453 12/17/14 2015 12/18/14 0637  WBC 7.7 8.3 8.0  HGB 6.9* 7.0* 6.5*  PLT 72* 86* 76*  CREATININE 6.31* 6.39* 4.89*   Estimated Creatinine Clearance: 13 mL/min (by C-G formula based on Cr of 4.89).  Recent Labs  12/16/14 0524 12/17/14 0453 12/17/14 1726 12/18/14 0637  VANCOTROUGH 31* 26*  --   --   VANCORANDOM  --   --  21 24     Microbiology: Recent Results (from the past 720 hour(s))  Culture, blood (single)     Status: None   Collection Time: 11/29/14 11:09 AM  Result Value Ref Range Status   Specimen Description BLOOD  Final   Special Requests NONE  Final   Culture  Setup Time   Final    GRAM NEGATIVE RODS AEROBIC BOTTLE ONLY CRITICAL RESULT CALLED TO, READ BACK BY AND VERIFIED WITH: SANDRA VORBA ON 11/30/14 AT 0825 BY JEF    Culture KLEBSIELLA OXYTOCA AEROBIC BOTTLE ONLY   Final   Report Status 12/04/2014 FINAL  Final   Organism ID, Bacteria KLEBSIELLA OXYTOCA  Final      Susceptibility   Klebsiella oxytoca - MIC*    AMPICILLIN >=32 RESISTANT Resistant     CEFTAZIDIME <=1 SENSITIVE Sensitive     CEFAZOLIN >=64 RESISTANT Resistant     CEFTRIAXONE <=1 SENSITIVE Sensitive     CIPROFLOXACIN <=0.25 SENSITIVE Sensitive     GENTAMICIN <=1 SENSITIVE Sensitive     IMIPENEM <=0.25 SENSITIVE Sensitive     TRIMETH/SULFA <=20 SENSITIVE Sensitive     CEFOXITIN <=4 SENSITIVE Sensitive      PIP/TAZO Value in next row Resistant      RESISTANT128    * KLEBSIELLA OXYTOCA  Urine culture     Status: None   Collection Time: 11/29/14 11:22 AM  Result Value Ref Range Status   Specimen Description URINE, CLEAN CATCH  Final   Special Requests NONE  Final   Culture >=100,000 COLONIES/mL KLEBSIELLA OXYTOCA  Final   Report Status 12/01/2014 FINAL  Final   Organism ID, Bacteria KLEBSIELLA OXYTOCA  Final      Susceptibility   Klebsiella oxytoca - MIC*    AMPICILLIN >=32 RESISTANT Resistant     CEFAZOLIN >=64 RESISTANT Resistant     CEFTRIAXONE 4 SENSITIVE Sensitive     CIPROFLOXACIN <=0.25 SENSITIVE Sensitive     GENTAMICIN <=1 SENSITIVE Sensitive     IMIPENEM <=0.25 SENSITIVE Sensitive     NITROFURANTOIN <=16 SENSITIVE Sensitive     TRIMETH/SULFA <=20 SENSITIVE Sensitive     CEFOXITIN <=4 SENSITIVE Sensitive     * >=100,000 COLONIES/mL KLEBSIELLA OXYTOCA  MRSA PCR Screening     Status: None   Collection Time: 11/29/14 12:13 PM  Result Value Ref Range Status   MRSA by PCR NEGATIVE NEGATIVE Final    Comment:        The GeneXpert MRSA Assay (FDA approved for NASAL specimens only), is one component of a comprehensive  MRSA colonization surveillance program. It is not intended to diagnose MRSA infection nor to guide or monitor treatment for MRSA infections.   Culture, blood (routine x 2)     Status: None   Collection Time: 11/29/14  1:19 PM  Result Value Ref Range Status   Specimen Description BLOOD  Final   Special Requests Immunocompromised  Final   Culture NO GROWTH 5 DAYS  Final   Report Status 12/04/2014 FINAL  Final  Urine culture     Status: None   Collection Time: 11/30/14 10:12 AM  Result Value Ref Range Status   Specimen Description URINE, CATHETERIZED  Final   Special Requests Immunocompromised  Final   Culture NO GROWTH 2 DAYS  Final   Report Status 12/02/2014 FINAL  Final  Culture, blood (routine x 2)     Status: None   Collection Time: 12/06/14  8:08 PM   Result Value Ref Range Status   Specimen Description BLOOD  Final   Special Requests NONE  Final   Culture  Setup Time   Final    GRAM POSITIVE COCCI IN BOTH AEROBIC AND ANAEROBIC BOTTLES CRITICAL RESULT CALLED TO, READ BACK BY AND VERIFIED WITH: JENNIFER BEZARD AT 2423 12/08/14.PMH CONFIRMED BY RWW    Culture   Final    STAPHYLOCOCCUS AURICULARIS IN BOTH AEROBIC AND ANAEROBIC BOTTLES    Report Status 12/11/2014 FINAL  Final   Organism ID, Bacteria STAPHYLOCOCCUS AURICULARIS  Final      Susceptibility   Staphylococcus auricularis - MIC*    CIPROFLOXACIN >=8 RESISTANT Resistant     ERYTHROMYCIN >=8 RESISTANT Resistant     GENTAMICIN <=0.5 SENSITIVE Sensitive     OXACILLIN >=4 RESISTANT Resistant     TETRACYCLINE <=1 SENSITIVE Sensitive     VANCOMYCIN 1 SENSITIVE Sensitive     CLINDAMYCIN <=0.25 SENSITIVE Sensitive     TRIMETH/SULFA Value in next row Sensitive      SENSITIVE<=20    LEVOFLOXACIN Value in next row Intermediate      INTERMEDIATE4    * STAPHYLOCOCCUS AURICULARIS  Culture, blood (routine x 2)     Status: None   Collection Time: 12/06/14  8:40 PM  Result Value Ref Range Status   Specimen Description BLOOD  Final   Special Requests NONE  Final   Culture NO GROWTH 5 DAYS  Final   Report Status 12/11/2014 FINAL  Final  Culture, blood (routine x 2)     Status: None   Collection Time: 12/08/14 11:40 AM  Result Value Ref Range Status   Specimen Description BLOOD  Final   Special Requests Normal  Final   Culture NO GROWTH 6 DAYS  Final   Report Status 12/14/2014 FINAL  Final  Culture, blood (routine x 2)     Status: None   Collection Time: 12/08/14 11:49 AM  Result Value Ref Range Status   Specimen Description BLOOD  Final   Special Requests Normal  Final   Culture NO GROWTH 6 DAYS  Final   Report Status 12/14/2014 FINAL  Final  C difficile quick scan w PCR reflex ([facility] only)     Status: None   Collection Time: 12/14/14 10:11 AM  Result Value Ref Range Status    C Diff antigen NEGATIVE  Final   C Diff toxin NEGATIVE  Final   C Diff interpretation Negative for C. difficile  Final    Anti-infectives    Start     Dose/Rate Route Frequency Ordered Stop   12/15/14 0630  vancomycin (VANCOCIN) IVPB 750 mg/150 ml premix     750 mg 150 mL/hr over 60 Minutes Intravenous  Once 12/15/14 0616 12/15/14 0726   12/14/14 1300  vancomycin (VANCOCIN) IVPB 750 mg/150 ml premix  Status:  Discontinued     750 mg 150 mL/hr over 60 Minutes Intravenous Every 48 hours 12/13/14 1107 12/14/14 0604   12/14/14 0603  vancomycin (VANCOCIN) IVPB 750 mg/150 ml premix     750 mg 150 mL/hr over 60 Minutes Intravenous Daily PRN 12/14/14 0604     12/09/14 0900  erythromycin 250 mg in sodium chloride 0.9 % 100 mL IVPB  Status:  Discontinued     250 mg 100 mL/hr over 60 Minutes Intravenous 3 times per day 12/09/14 0859 12/13/14 1054   12/08/14 1100  metroNIDAZOLE (FLAGYL) IVPB 500 mg  Status:  Discontinued     500 mg 100 mL/hr over 60 Minutes Intravenous Every 8 hours 12/08/14 0958 12/11/14 1048   12/08/14 0900  vancomycin (VANCOCIN) IVPB 750 mg/150 ml premix  Status:  Discontinued     750 mg 150 mL/hr over 60 Minutes Intravenous Every 24 hours 12/07/14 1102 12/12/14 0955   12/07/14 1030  cefTAZidime (FORTAZ) 2 g in dextrose 5 % 50 mL IVPB  Status:  Discontinued     2 g 100 mL/hr over 30 Minutes Intravenous Every 24 hours 12/07/14 1021 12/11/14 1048   12/07/14 0700  vancomycin (VANCOCIN) IVPB 1000 mg/200 mL premix  Status:  Discontinued     1,000 mg 200 mL/hr over 60 Minutes Intravenous Every 24 hours 12/06/14 2225 12/07/14 1102   12/06/14 2200  piperacillin-tazobactam (ZOSYN) IVPB 4.5 g  Status:  Discontinued     4.5 g 200 mL/hr over 30 Minutes Intravenous 3 times per day 12/06/14 1946 12/07/14 1011   12/06/14 2000  vancomycin (VANCOCIN) IVPB 1000 mg/200 mL premix     1,000 mg 200 mL/hr over 60 Minutes Intravenous STAT 12/06/14 1947 12/06/14 2113   12/06/14 2000  cefTRIAXone  (ROCEPHIN) 1 g in dextrose 5 % 50 mL IVPB - Premix  Status:  Discontinued     1 g 100 mL/hr over 30 Minutes Intravenous Every 24 hours 12/06/14 1959 12/07/14 1011   12/06/14 1945  piperacillin-tazobactam (ZOSYN) IVPB 3.375 g  Status:  Discontinued     3.375 g 100 mL/hr over 30 Minutes Intravenous 4 times per day 12/06/14 1940 12/06/14 2007   12/06/14 1945  vancomycin (VANCOCIN) IVPB 1000 mg/200 mL premix  Status:  Discontinued     1,000 mg 200 mL/hr over 60 Minutes Intravenous Every 12 hours 12/06/14 1940 12/06/14 2008   12/01/14 0945  piperacillin-tazobactam (ZOSYN) IVPB 3.375 g  Status:  Discontinued     3.375 g 12.5 mL/hr over 240 Minutes Intravenous 3 times per day 12/01/14 0936 12/03/14 1337   11/30/14 1000  cefTRIAXone (ROCEPHIN) 2 g in dextrose 5 % 50 mL IVPB - Premix  Status:  Discontinued    Comments:  Entered per MD progress note   2 g 100 mL/hr over 30 Minutes Intravenous Every 24 hours 11/29/14 2157 12/06/14 1940   11/29/14 1415  cefTRIAXone (ROCEPHIN) 1 g in dextrose 5 % 50 mL IVPB - Premix     1 g 100 mL/hr over 30 Minutes Intravenous  Once 11/29/14 1406 11/29/14 1452   11/29/14 1414  cefTRIAXone (ROCEPHIN) 20 MG/ML IVPB 50 mL    Comments:  MONAR, NELLIE: cabinet override      11/29/14 1414 11/29/14 1424  Assessment: Patient with renal failure that may require renal replacement therapy on vancomycin for Staph auricularis bacteremia with vancomycin trough above goal. Last dose of vancomycin was 7/3.   Random vancomycin level 7/5 at 17:30: 68mcg/ml, however patient taken to dialysis that evening. Per RN, patient had about 1.5 hrs of HD.  Random vancomycin level 7/6 at 0637: 24 mcg/ml  Goal of Therapy:  Vancomycin trough level 15-20 mcg/ml  Plan:  Random vancomycin level 7/6 at 0637: 24 mcg/ml.  Per notes, patient is to receive HD today (2.5 hours per orders).   Will recheck random vancomycin level after dialysis today at 1600 and plan to redose if level is < 15.    Unclear what the plan is for dialysis at this point. Will need to follow up with nephrology in regards to plans continued HD and adjust dosing accordingly.  Pharmacy to follow per consult.   Amir Fick G 12/18/2014,1:23 PM

## 2014-12-18 NOTE — Progress Notes (Signed)
Subjective:  Hemodialysis yesterday. First treatment. Tolerated well. As per nursing, more awake this morning.  Hemoglobin 6.5 today. Plan for PRBC transfusion.   Objective:  Vital signs in last 24 hours:  Temp:  [97.7 F (36.5 C)-98.6 F (37 C)] 98.1 F (36.7 C) (07/06 0911) Pulse Rate:  [103-126] 123 (07/06 0911) Resp:  [17-27] 22 (07/06 0911) BP: (90-113)/(55-81) 101/81 mmHg (07/06 0911) SpO2:  [100 %] 100 % (07/06 0911) Weight:  [62.869 kg (138 lb 9.6 oz)-63.5 kg (139 lb 15.9 oz)] 62.876 kg (138 lb 9.9 oz) (07/06 0550)  Weight change: 0 kg (0 lb) Filed Weights   12/17/14 1928 12/17/14 2150 12/18/14 0550  Weight: 63.5 kg (139 lb 15.9 oz) 62.869 kg (138 lb 9.6 oz) 62.876 kg (138 lb 9.9 oz)    Intake/Output: I/O last 3 completed shifts: In: 3337  Out: 3585 [Urine:3300; Emesis/NG output:245; Stool:40]   Intake/Output this shift:     Physical Exam: General: awake, ill appearing  Head: NG in place, oral mucosa dry  Eyes: Anicteric  Neck: Supple, trachea midline  Lungs:  Clear to auscultation normal effort  Heart: S1S2 no rubs  Abdomen:  No distension today, BS present, soft  Extremities: Trace LE edema  Neurologic: Answers yes and no questions, moves all four extremities, follows simple commands  Skin: No acute rashes  Access: Right femoral temp HD catheter    Basic Metabolic Panel:  Recent Labs Lab 12/12/14 0535  12/13/14 0448  12/14/14 0502  12/15/14 1508 12/16/14 0524 12/16/14 1414 12/17/14 0453 12/17/14 2015 12/18/14 0637  NA 143  --  145  --  143  < > 142 144  --  143 144 141  K 3.2*  < > 3.4*  < > 3.1*  < > 3.3* 3.0* 3.2* 3.8 3.8 3.8  CL 112*  --  111  --  112*  < > 114* 116*  --  123* 120* 112*  CO2 24  --  23  --  24  < > 20* 19*  --  16* 14* 22  GLUCOSE 177*  --  192*  --  158*  < > 138* 145*  --  157* 137* 169*  BUN 60*  --  64*  --  78*  < > 99* 89*  --  103* 116* 86*  CREATININE 3.73*  < > 4.26*  --  4.91*  < > 5.74* 6.00*  --  6.31* 6.39*  4.89*  CALCIUM 7.8*  --  8.7*  --  8.0*  < > 8.2* 8.5*  --  8.5* 8.8* 8.3*  MG 2.1  --  2.2  --  2.3  --   --  1.8  --  1.9  --  1.7  PHOS 5.0*  --  6.2*  --  5.9*  --   --  4.5  --  3.7  --   --   < > = values in this interval not displayed.  Liver Function Tests:  Recent Labs Lab 12/12/14 0535 12/14/14 0502 12/17/14 2015  AST 13* 12*  --   ALT 9* 9* 15*  ALKPHOS 112 114  --   BILITOT 0.7 0.6  --   PROT 5.2* 5.3*  --   ALBUMIN 2.1* 2.1*  --    No results for input(s): LIPASE, AMYLASE in the last 168 hours. No results for input(s): AMMONIA in the last 168 hours.  CBC:  Recent Labs Lab 12/13/14 0448 12/14/14 0502  12/15/14 3338 12/16/14 3291 12/17/14 9166  12/17/14 2015 12/18/14 0637  WBC 11.1* 10.0  --  9.4 9.4 7.7 8.3 8.0  NEUTROABS 7.9* 8.0*  --  8.4* 7.6  --   --  6.5  HGB 8.2* 7.3*  < > 7.5* 7.1* 6.9* 7.0* 6.5*  HCT 25.1* 22.4*  < > 22.6* 22.1* 21.5* 21.7* 19.9*  MCV 87.7 87.7  --  84.7 85.9 86.8 87.1 84.7  PLT 36* 36*  --  43* 59* 72* 86* 76*  < > = values in this interval not displayed.  Cardiac Enzymes: No results for input(s): CKTOTAL, CKMB, CKMBINDEX, TROPONINI in the last 168 hours.  BNP: Invalid input(s): POCBNP  CBG:  Recent Labs Lab 12/17/14 0006 12/17/14 0612 12/17/14 1203 12/17/14 2353 12/18/14 0601  GLUCAP 141* 146* 144* 155* 166*    Microbiology: Results for orders placed or performed during the hospital encounter of 11/29/14  MRSA PCR Screening     Status: None   Collection Time: 11/29/14 12:13 PM  Result Value Ref Range Status   MRSA by PCR NEGATIVE NEGATIVE Final    Comment:        The GeneXpert MRSA Assay (FDA approved for NASAL specimens only), is one component of a comprehensive MRSA colonization surveillance program. It is not intended to diagnose MRSA infection nor to guide or monitor treatment for MRSA infections.   Culture, blood (routine x 2)     Status: None   Collection Time: 11/29/14  1:19 PM  Result Value  Ref Range Status   Specimen Description BLOOD  Final   Special Requests Immunocompromised  Final   Culture NO GROWTH 5 DAYS  Final   Report Status 12/04/2014 FINAL  Final  Urine culture     Status: None   Collection Time: 11/30/14 10:12 AM  Result Value Ref Range Status   Specimen Description URINE, CATHETERIZED  Final   Special Requests Immunocompromised  Final   Culture NO GROWTH 2 DAYS  Final   Report Status 12/02/2014 FINAL  Final  Culture, blood (routine x 2)     Status: None   Collection Time: 12/06/14  8:08 PM  Result Value Ref Range Status   Specimen Description BLOOD  Final   Special Requests NONE  Final   Culture  Setup Time   Final    GRAM POSITIVE COCCI IN BOTH AEROBIC AND ANAEROBIC BOTTLES CRITICAL RESULT CALLED TO, READ BACK BY AND VERIFIED WITH: JENNIFER BEZARD AT 2202 12/08/14.PMH CONFIRMED BY RWW    Culture   Final    STAPHYLOCOCCUS AURICULARIS IN BOTH AEROBIC AND ANAEROBIC BOTTLES    Report Status 12/11/2014 FINAL  Final   Organism ID, Bacteria STAPHYLOCOCCUS AURICULARIS  Final      Susceptibility   Staphylococcus auricularis - MIC*    CIPROFLOXACIN >=8 RESISTANT Resistant     ERYTHROMYCIN >=8 RESISTANT Resistant     GENTAMICIN <=0.5 SENSITIVE Sensitive     OXACILLIN >=4 RESISTANT Resistant     TETRACYCLINE <=1 SENSITIVE Sensitive     VANCOMYCIN 1 SENSITIVE Sensitive     CLINDAMYCIN <=0.25 SENSITIVE Sensitive     TRIMETH/SULFA Value in next row Sensitive      SENSITIVE<=20    LEVOFLOXACIN Value in next row Intermediate      INTERMEDIATE4    * STAPHYLOCOCCUS AURICULARIS  Culture, blood (routine x 2)     Status: None   Collection Time: 12/06/14  8:40 PM  Result Value Ref Range Status   Specimen Description BLOOD  Final   Special Requests NONE  Final   Culture NO GROWTH 5 DAYS  Final   Report Status 12/11/2014 FINAL  Final  Culture, blood (routine x 2)     Status: None   Collection Time: 12/08/14 11:40 AM  Result Value Ref Range Status   Specimen  Description BLOOD  Final   Special Requests Normal  Final   Culture NO GROWTH 6 DAYS  Final   Report Status 12/14/2014 FINAL  Final  Culture, blood (routine x 2)     Status: None   Collection Time: 12/08/14 11:49 AM  Result Value Ref Range Status   Specimen Description BLOOD  Final   Special Requests Normal  Final   Culture NO GROWTH 6 DAYS  Final   Report Status 12/14/2014 FINAL  Final  C difficile quick scan w PCR reflex ([facility] only)     Status: None   Collection Time: 12/14/14 10:11 AM  Result Value Ref Range Status   C Diff antigen NEGATIVE  Final   C Diff toxin NEGATIVE  Final   C Diff interpretation Negative for C. difficile  Final    Coagulation Studies: No results for input(s): LABPROT, INR in the last 72 hours.  Urinalysis: No results for input(s): COLORURINE, LABSPEC, PHURINE, GLUCOSEU, HGBUR, BILIRUBINUR, KETONESUR, PROTEINUR, UROBILINOGEN, NITRITE, LEUKOCYTESUR in the last 72 hours.  Invalid input(s): APPERANCEUR    Imaging: Dg Abd 2 Views  12/16/2014   CLINICAL DATA:  Followup abdominal distension.  EXAM: ABDOMEN - 2 VIEW  COMPARISON:  Radiographs 12/15/2014  FINDINGS: Improved bowel gas pattern with less distended bowel. No free air. The left double-J ureteral stent is stable. The NG tube is stable.  IMPRESSION: Improved bowel gas pattern with less distended air-filled bowel.   Electronically Signed   By: Marijo Sanes M.D.   On: 12/16/2014 09:57   Dg Abd Portable 1v  12/16/2014   CLINICAL DATA:  Nasogastric tube placement  EXAM: PORTABLE ABDOMEN - 1 VIEW  COMPARISON:  Portable exam at 1157 hr compared to 12/16/2014 at 0940 hr  FINDINGS: Course of nasogastric tube is poorly visualized in the chest due to motion but appears to be coiled in the proximal stomach.  LEFT nephrostomy tube noted.  Bowel gas pattern poorly assessed due to motion.  RIGHT basilar atelectasis.  IMPRESSION: Nasogastric tube appears coiled in the proximal stomach.   Electronically Signed   By: Lavonia Dana M.D.   On: 12/16/2014 12:17     Medications:   . TPN (CLINIMIX) Adult without lytes 70 mL/hr at 12/18/14 0611   . antiseptic oral rinse  7 mL Mouth Rinse BID  . insulin aspart  1-3 Units Subcutaneous Q6H  . methylPREDNISolone (SOLU-MEDROL) injection  60 mg Intravenous Once  . mometasone-formoterol  2 puff Inhalation BID  . pantoprazole (PROTONIX) IV  40 mg Intravenous Q12H  . potassium chloride  40 mEq Oral BID  . scopolamine  1 patch Transdermal Q72H  . thiamine IV  100 mg Intravenous Daily  . ziprasidone  10 mg Intramuscular QHS   acetaminophen **OR** acetaminophen, albuterol, bethanechol, haloperidol lactate, heparin lock flush, heparin lock flush, magnesium hydroxide, metoprolol, morphine injection, ondansetron (ZOFRAN) IV, promethazine, sodium chloride, sodium chloride, sodium chloride, vancomycin, ziprasidone  Assessment/ Plan:  32 AAM with progressive mantle cell lymphoma, RICE chemo in 11/2014, hx left sided hydronephrosis and hydroureter. S/p ureteral stent placement    1.  Acute renal failure due to ATN: hemodialysis yesterday. Tolerated treatment well. Due to worsening azotemia, will provide second treatment today. Orders  prepared.  Methylprednisolone and TPN may be causing elevated BUN.   2.  Sepsis with klebsiella oxytoca: completed two weeks for IV ceftazidime. Appreciate ID input. Last vancomycin 7/3   3.  Anemia unspecified D64.9: plan for transfusion 1 unit PRBC with HD today.   4. Hypokalemia: no potassium in TPN. Getting potassium supplements.   Long term however pt would not make a good long term dialysis candidate given his current medical condition.  LOS: [place], [place] 7/6/20169:11 AM

## 2014-12-18 NOTE — Progress Notes (Signed)
[place] at [facility]                                                                                                                                                                                            Patient Demographics   Caleb Francis, is a 69 y.o. male, DOB - 1946/01/19, BLT:[id number]  Admit date - 11/29/2014   Admitting Physician Aldean Jewett, MD  Outpatient Primary MD for the patient is No PCP Per Patient   Subjective:   No concerns from patient. drowzy. NGT in place.  Review of Systems:   ROS Drowzy. confused  Vitals:   Filed Vitals:   12/17/14 2124 12/17/14 2150 12/18/14 0550 12/18/14 0911  BP: 95/66 100/55 90/60 101/81  Pulse: 108 126 123 123  Temp: 97.7 F (36.5 C) 98.2 F (36.8 C) 98.6 F (37 C) 98.1 F (36.7 C)  TempSrc: Axillary Oral Oral Oral  Resp: 23 20 20 22   Height:      Weight:  62.869 kg (138 lb 9.6 oz) 62.876 kg (138 lb 9.9 oz)   SpO2:  100% 100% 100%    Wt Readings from Last 3 Encounters:  12/18/14 62.876 kg (138 lb 9.9 oz)  11/27/14 68.2 kg (150 lb 5.7 oz)  11/25/14 70.761 kg (156 lb)     Intake/Output Summary (Last 24 hours) at 12/18/14 0947 Last data filed at 12/18/14 0076  Gross per 24 hour  Intake   1445 ml  Output   1885 ml  Net   -440 ml    Physical Exam:   GENERAL: critically ill-appearing PERRLA Atraumatic  NECK: Supple. There is no jugular venous distention. No bruits, no lymphadenopathy, no thyromegaly.  HEART:  Tachycardic 2/6 systolic ejection murmur no rubs LUNGS: Coarse breath sounds. ABDOMEN: Distended bowel hypoactive without rebound or guarding  EXTREMITIES: No evidence of any cyanosis, clubbing,   NEUROLOGIC: Lethargic  SKIN: Moist and warm with no rashes appreciated.  Psych:    no agitation reported overnight.  Radiology Reports 12/10/2014: CT abdomen: Large distended colonic ileus. No obstruction  12/12/2014: KUB   CBC  Recent Labs Lab  12/13/14 0448 12/14/14 0502  12/15/14 0529 12/16/14 0524 12/17/14 0453 12/17/14 2015 12/18/14 0637  WBC 11.1* 10.0  --  9.4 9.4 7.7 8.3 8.0  HGB 8.2* 7.3*  < > 7.5* 7.1* 6.9* 7.0* 6.5*  HCT 25.1* 22.4*  < > 22.6* 22.1* 21.5* 21.7* 19.9*  PLT 36* 36*  --  43* 59* 72* 86* 76*  MCV 87.7 87.7  --  84.7 85.9 86.8 87.1 84.7  MCH 28.7 28.6  --  27.9 27.6 27.7 28.0 27.9  MCHC 32.7 32.6  --  33.0 32.1 31.9* 32.1 33.0  RDW 18.1* 18.2*  --  20.3* 20.2* 20.0* 19.3* 19.0*  LYMPHSABS 2.3 1.4  --  0.8 1.3  --   --  0.7  MONOABS 0.9 0.6  --  0.2 0.5  --   --  0.8  EOSABS 0.0 0.0  --  0.0 0.0  --   --  0.0  BASOSABS 0.0 0.0  --  0.0 0.0  --   --  0.0  < > = values in this interval not displayed.  Chemistries   Recent Labs Lab 12/12/14 0535  12/13/14 0448  12/14/14 0502  12/15/14 1508 12/16/14 0524 12/16/14 1414 12/17/14 0453 12/17/14 2015 12/18/14 0637  NA 143  --  145  --  143  < > 142 144  --  143 144 141  K 3.2*  < > 3.4*  < > 3.1*  < > 3.3* 3.0* 3.2* 3.8 3.8 3.8  CL 112*  --  111  --  112*  < > 114* 116*  --  123* 120* 112*  CO2 24  --  23  --  24  < > 20* 19*  --  16* 14* 22  GLUCOSE 177*  --  192*  --  158*  < > 138* 145*  --  157* 137* 169*  BUN 60*  --  64*  --  78*  < > 99* 89*  --  103* 116* 86*  CREATININE 3.73*  < > 4.26*  --  4.91*  < > 5.74* 6.00*  --  6.31* 6.39* 4.89*  CALCIUM 7.8*  --  8.7*  --  8.0*  < > 8.2* 8.5*  --  8.5* 8.8* 8.3*  MG 2.1  --  2.2  --  2.3  --   --  1.8  --  1.9  --  1.7  AST 13*  --   --   --  12*  --   --   --   --   --   --   --   ALT 9*  --   --   --  9*  --   --   --   --   --  15*  --   ALKPHOS 112  --   --   --  114  --   --   --   --   --   --   --   BILITOT 0.7  --   --   --  0.6  --   --   --   --   --   --   --   < > = values in this interval not displayed. -------------------------------------------------------------------------------------------------------------------  Recent Labs  12/17/14 1226  TSH 0.698    ------------------------------------------------------------------------------------------------------------------  Coagulation profile No results for input(s): INR, PROTIME in the last 168 hours.   Assessment & Plan  This is a 69 year old male male with mantle cell lymphoma undergoing chemotherapy who was admitted June 15 from oncology clinic with altered mental status and chest pain.   # Acute renal failure due to ATN : Worsening Patient's creatinine continues to worsen. HD to start today.  # Sepsis: With Klebsiella UTI and bacteremia and Staph bacteremia Patient's urine culture and blood cultures were positive for Klebsiella on admission. Hypoluxo. Repeat blood cx on 12/06/2014 are growing  STAPHYLOCOCCUS AURICULARIS . Vancomycin till 12/21/2014.  # Acute encephalopathy - Metabolic  encephalopathy due to sepsis and urinary tract infection/ARF/Hypernatremia along with ICU delirium. - CT head/MRI brain showing no evidence of acute pathology.   # Chest pain on admission: No EKG changes to suggest ischemia.  Continue to monitor for any further cardiac symptoms  # Mantle cell lymphoma: Patient is on salvage chemotherapy. Patient follows with Dr. Mike Gip.   He is s/p chemo He was on GCSF and no longer neutropenic.    # Panctytopenia: Due to chemotherapy and Mantle cell lymphoma, patient is currently on Neupogen. Patient is s/p 2 units PRBC on 6/25 and 1 unit 12/14/2014 and has received PLT transfusion now stopped Patient needs premedication prior to any transfusions including Benadryl and Tylenol.   # Thrombocytopenia This is due to sepsis/lymphoma and chemo Improving  # colonic Ileus with GI bleed: CT scan shows colonic ileus that has worsened. Patient is no longer neutropenic. Rectal tube in place. Repeat xray showing some improvement. Started daily miralax.  # Hypernatremia - resolved  # GI bleed: Patient currently has an NG tube placed. PPI. High risk for re bleeding due to  thrombocytopenia.  # Acute blood loss anmeia over pancytopenia Due to GI losses and lymphoma.  Overall Prognosis is very poor   DNR  DVT Prophylaxis   SCD's    Lab Results  Component Value Date   PLT 76* 12/18/2014     Poor prognosis  Total time spent 35 minutes  Greater than 50% of time spent in care and coordination  Hillary Bow R M.D on 12/18/2014 at 9:47 AM  Between 7am to 6pm - Pager - [phone]  After 6pm go to [email or web address] - password [email or web address] [facility]   Office  [phone]

## 2014-12-18 NOTE — Progress Notes (Signed)
[place] for Vancomycin, TPN Electrolyte Management and Constipation Management Indication: ICU Status   No Known Allergies  Patient Measurements: Height: 6' (182.9 cm) Weight: 138 lb 9.9 oz (62.876 kg) IBW/kg (Calculated) : 77.6   Vital Signs: Temp: 98 F (36.7 C) (07/06 1302) Temp Source: Oral (07/06 1302) BP: 93/66 mmHg (07/06 1302) Pulse Rate: 112 (07/06 1302) Intake/Output from previous day: 07/05 0701 - 07/06 0700 In: 3337 [DDU:2025] Out: 2135 [Urine:1850; Emesis/NG output:245; Stool:40]  Labs:  Recent Labs  12/16/14 0524 12/17/14 0453 12/17/14 2015 12/18/14 0637  WBC 9.4 7.7 8.3 8.0  HGB 7.1* 6.9* 7.0* 6.5*  HCT 22.1* 21.5* 21.7* 19.9*  PLT 59* 72* 86* 76*  CREATININE 6.00* 6.31* 6.39* 4.89*  MG 1.8 1.9  --  1.7  PHOS 4.5 3.7  --   --   ALT  --   --  15*  --    Estimated Creatinine Clearance: 13 mL/min (by C-G formula based on Cr of 4.89). BMP Latest Ref Rng 12/18/2014 12/17/2014 12/17/2014  Glucose 65 - 99 mg/dL 169(H) 137(H) 157(H)  BUN 6 - 20 mg/dL 86(H) 116(H) 103(H)  Creatinine 0.61 - 1.24 mg/dL 4.89(H) 6.39(H) 6.31(H)  Sodium 135 - 145 mmol/L 141 144 143  Potassium 3.5 - 5.1 mmol/L 3.8 3.8 3.8  Chloride 101 - 111 mmol/L 112(H) 120(H) 123(H)  CO2 22 - 32 mmol/L 22 14(L) 16(L)  Calcium 8.9 - 10.3 mg/dL 8.3(L) 8.8(L) 8.5(L)     Recent Labs  12/17/14 2353 12/18/14 0601 12/18/14 1146  GLUCAP 155* 166* 138*    Microbiology: Recent Results (from the past 720 hour(s))  Culture, blood (single)     Status: None   Collection Time: 11/29/14 11:09 AM  Result Value Ref Range Status   Specimen Description BLOOD  Final   Special Requests NONE  Final   Culture  Setup Time   Final    GRAM NEGATIVE RODS AEROBIC BOTTLE ONLY CRITICAL RESULT CALLED TO, READ BACK BY AND VERIFIED WITH: SANDRA VORBA ON 11/30/14 AT 0825 BY JEF    Culture KLEBSIELLA OXYTOCA AEROBIC BOTTLE ONLY   Final   Report Status 12/04/2014 FINAL  Final    Organism ID, Bacteria KLEBSIELLA OXYTOCA  Final      Susceptibility   Klebsiella oxytoca - MIC*    AMPICILLIN >=32 RESISTANT Resistant     CEFTAZIDIME <=1 SENSITIVE Sensitive     CEFAZOLIN >=64 RESISTANT Resistant     CEFTRIAXONE <=1 SENSITIVE Sensitive     CIPROFLOXACIN <=0.25 SENSITIVE Sensitive     GENTAMICIN <=1 SENSITIVE Sensitive     IMIPENEM <=0.25 SENSITIVE Sensitive     TRIMETH/SULFA <=20 SENSITIVE Sensitive     CEFOXITIN <=4 SENSITIVE Sensitive     PIP/TAZO Value in next row Resistant      RESISTANT128    * KLEBSIELLA OXYTOCA  Urine culture     Status: None   Collection Time: 11/29/14 11:22 AM  Result Value Ref Range Status   Specimen Description URINE, CLEAN CATCH  Final   Special Requests NONE  Final   Culture >=100,000 COLONIES/mL KLEBSIELLA OXYTOCA  Final   Report Status 12/01/2014 FINAL  Final   Organism ID, Bacteria KLEBSIELLA OXYTOCA  Final      Susceptibility   Klebsiella oxytoca - MIC*    AMPICILLIN >=32 RESISTANT Resistant     CEFAZOLIN >=64 RESISTANT Resistant     CEFTRIAXONE 4 SENSITIVE Sensitive     CIPROFLOXACIN <=0.25 SENSITIVE Sensitive  GENTAMICIN <=1 SENSITIVE Sensitive     IMIPENEM <=0.25 SENSITIVE Sensitive     NITROFURANTOIN <=16 SENSITIVE Sensitive     TRIMETH/SULFA <=20 SENSITIVE Sensitive     CEFOXITIN <=4 SENSITIVE Sensitive     * >=100,000 COLONIES/mL KLEBSIELLA OXYTOCA  MRSA PCR Screening     Status: None   Collection Time: 11/29/14 12:13 PM  Result Value Ref Range Status   MRSA by PCR NEGATIVE NEGATIVE Final    Comment:        The GeneXpert MRSA Assay (FDA approved for NASAL specimens only), is one component of a comprehensive MRSA colonization surveillance program. It is not intended to diagnose MRSA infection nor to guide or monitor treatment for MRSA infections.   Culture, blood (routine x 2)     Status: None   Collection Time: 11/29/14  1:19 PM  Result Value Ref Range Status   Specimen Description BLOOD  Final    Special Requests Immunocompromised  Final   Culture NO GROWTH 5 DAYS  Final   Report Status 12/04/2014 FINAL  Final  Urine culture     Status: None   Collection Time: 11/30/14 10:12 AM  Result Value Ref Range Status   Specimen Description URINE, CATHETERIZED  Final   Special Requests Immunocompromised  Final   Culture NO GROWTH 2 DAYS  Final   Report Status 12/02/2014 FINAL  Final  Culture, blood (routine x 2)     Status: None   Collection Time: 12/06/14  8:08 PM  Result Value Ref Range Status   Specimen Description BLOOD  Final   Special Requests NONE  Final   Culture  Setup Time   Final    GRAM POSITIVE COCCI IN BOTH AEROBIC AND ANAEROBIC BOTTLES CRITICAL RESULT CALLED TO, READ BACK BY AND VERIFIED WITH: JENNIFER BEZARD AT 5852 12/08/14.PMH CONFIRMED BY RWW    Culture   Final    STAPHYLOCOCCUS AURICULARIS IN BOTH AEROBIC AND ANAEROBIC BOTTLES    Report Status 12/11/2014 FINAL  Final   Organism ID, Bacteria STAPHYLOCOCCUS AURICULARIS  Final      Susceptibility   Staphylococcus auricularis - MIC*    CIPROFLOXACIN >=8 RESISTANT Resistant     ERYTHROMYCIN >=8 RESISTANT Resistant     GENTAMICIN <=0.5 SENSITIVE Sensitive     OXACILLIN >=4 RESISTANT Resistant     TETRACYCLINE <=1 SENSITIVE Sensitive     VANCOMYCIN 1 SENSITIVE Sensitive     CLINDAMYCIN <=0.25 SENSITIVE Sensitive     TRIMETH/SULFA Value in next row Sensitive      SENSITIVE<=20    LEVOFLOXACIN Value in next row Intermediate      INTERMEDIATE4    * STAPHYLOCOCCUS AURICULARIS  Culture, blood (routine x 2)     Status: None   Collection Time: 12/06/14  8:40 PM  Result Value Ref Range Status   Specimen Description BLOOD  Final   Special Requests NONE  Final   Culture NO GROWTH 5 DAYS  Final   Report Status 12/11/2014 FINAL  Final  Culture, blood (routine x 2)     Status: None   Collection Time: 12/08/14 11:40 AM  Result Value Ref Range Status   Specimen Description BLOOD  Final   Special Requests Normal  Final    Culture NO GROWTH 6 DAYS  Final   Report Status 12/14/2014 FINAL  Final  Culture, blood (routine x 2)     Status: None   Collection Time: 12/08/14 11:49 AM  Result Value Ref Range Status   Specimen Description BLOOD  Final   Special Requests Normal  Final   Culture NO GROWTH 6 DAYS  Final   Report Status 12/14/2014 FINAL  Final  C difficile quick scan w PCR reflex ([facility] only)     Status: None   Collection Time: 12/14/14 10:11 AM  Result Value Ref Range Status   C Diff antigen NEGATIVE  Final   C Diff toxin NEGATIVE  Final   C Diff interpretation Negative for C. difficile  Final    Medications:  Scheduled:  . sodium chloride   Intravenous Once  . antiseptic oral rinse  7 mL Mouth Rinse BID  . feeding supplement (VITAL 1.5 CAL)  1,000 mL Per Tube Q24H  . insulin aspart  1-3 Units Subcutaneous Q6H  . methylPREDNISolone (SOLU-MEDROL) injection  60 mg Intravenous Once  . mometasone-formoterol  2 puff Inhalation BID  . pantoprazole (PROTONIX) IV  40 mg Intravenous Q12H  . potassium chloride  40 mEq Oral BID  . scopolamine  1 patch Transdermal Q72H  . thiamine IV  100 mg Intravenous Daily  . ziprasidone  10 mg Intramuscular QHS   Infusions:  . [place] KitchenTPN (CLINIMIX-E) Adult    . TPN (CLINIMIX) Adult without lytes 70 mL/hr at 12/18/14 0611    Assessment: 69 yo male with progressive mantle cell lymphoma. Patient being treated for electrolyte imbalance and AMS. Patient currently on vancomycin for Staph auricularis bacteremia.   Patient transitioned to Clinimix 5/20 with electrolytes today.  Goal of Therapy:  Resolution of Symptoms  Plan:   1. TPN/Electrolytes: Patient now receiving Clinimix5/20 with electrolytes at 50mL/hr.  Electrolytes within normal limits. Estimated that patient will receive 50 meq of potassium chloride from TPN.  Will decrease potassium supplement to 20 meq VT BID. Will recheck in AM.    2.  Glucose levels ranged from 138-166 .  Continue SSI orders.  No  scheduled insulin required at this time.  Last bowel movement charted on 7/5.  Pharmacy will continue to monitor and adjust per consult.    Murrell Converse, PharmD Clinical Pharmacist 12/18/2014

## 2014-12-18 NOTE — Care Management (Signed)
Important Message  Patient Details  Name: Caleb Francis MRN: [id number] Date of Birth: 11-07-1945   Medicare Important Message Given:  Yes-third notification given    [name] 12/18/2014, 9:49 AM

## 2014-12-18 NOTE — Progress Notes (Signed)
Brief Nutrition Note:   Results of abdominal xray noted. Per report, NG tube not visualized, increased gaseous distention of small bowel, small bowel obstruction cannot be excluded. Discussed with Vickey Huger, NP. Will discontinue TF orders at this time.  Continue TPN as ordered, recommend addition of 20% lipids tomorrow (recommended dose of 20% lipids 2x weekly as previously discussed with MD). Continue to assess.

## 2014-12-19 ENCOUNTER — Inpatient Hospital Stay: Payer: Medicare Other

## 2014-12-19 ENCOUNTER — Other Ambulatory Visit (HOSPITAL_COMMUNITY): Payer: Self-pay

## 2014-12-19 LAB — CBC WITH DIFFERENTIAL/PLATELET
Band Neutrophils: 2 % (ref 0–10)
Basophils Absolute: 0 10*3/uL (ref 0.0–0.1)
Basophils Relative: 0 % (ref 0–1)
Blasts: 0 %
Eosinophils Absolute: 0.2 10*3/uL (ref 0.0–0.7)
Eosinophils Relative: 2 % (ref 0–5)
HCT: 26.5 % — ABNORMAL LOW (ref 40.0–52.0)
Hemoglobin: 8.8 g/dL — ABNORMAL LOW (ref 13.0–18.0)
Lymphocytes Relative: 12 % (ref 12–46)
Lymphs Abs: 1.2 10*3/uL (ref 0.7–4.0)
MCH: 28.7 pg (ref 26.0–34.0)
MCHC: 33.2 g/dL (ref 32.0–36.0)
MCV: 86.6 fL (ref 80.0–100.0)
Metamyelocytes Relative: 2 %
Monocytes Absolute: 0.7 10*3/uL (ref 0.1–1.0)
Monocytes Relative: 7 % (ref 3–12)
Myelocytes: 1 %
Neutro Abs: 8.2 10*3/uL — ABNORMAL HIGH (ref 1.7–7.7)
Neutrophils Relative %: 74 % (ref 43–77)
Other: 0 %
Platelets: 77 10*3/uL — ABNORMAL LOW (ref 150–440)
Promyelocytes Absolute: 0 %
RBC: 3.06 MIL/uL — ABNORMAL LOW (ref 4.40–5.90)
RDW: 18.4 % — ABNORMAL HIGH (ref 11.5–14.5)
WBC: 10.3 10*3/uL (ref 3.8–10.6)
nRBC: 0 /100 WBC

## 2014-12-19 LAB — BASIC METABOLIC PANEL
Anion gap: 9 (ref 5–15)
BUN: 69 mg/dL — AB (ref 6–20)
CHLORIDE: 105 mmol/L (ref 101–111)
CO2: 25 mmol/L (ref 22–32)
Calcium: 8.7 mg/dL — ABNORMAL LOW (ref 8.9–10.3)
Creatinine, Ser: 4.19 mg/dL — ABNORMAL HIGH (ref 0.61–1.24)
GFR calc Af Amer: 16 mL/min — ABNORMAL LOW (ref >60)
GFR calc non Af Amer: 13 mL/min — ABNORMAL LOW (ref >60)
GLUCOSE: 369 mg/dL — AB (ref 65–99)
POTASSIUM: 3.8 mmol/L (ref 3.5–5.1)
Sodium: 139 mmol/L (ref 135–145)

## 2014-12-19 LAB — TYPE AND SCREEN
ABO/RH(D): O POS
Antibody Screen: NEGATIVE
Unit division: 0

## 2014-12-19 LAB — GLUCOSE, CAPILLARY
GLUCOSE-CAPILLARY: 107 mg/dL — AB (ref 65–99)
GLUCOSE-CAPILLARY: 129 mg/dL — AB (ref 65–99)
GLUCOSE-CAPILLARY: 155 mg/dL — AB (ref 65–99)
GLUCOSE-CAPILLARY: 172 mg/dL — AB (ref 65–99)
Glucose-Capillary: 147 mg/dL — ABNORMAL HIGH (ref 65–99)
Glucose-Capillary: 158 mg/dL — ABNORMAL HIGH (ref 65–99)

## 2014-12-19 LAB — HEPATITIS B CORE ANTIBODY, TOTAL: Hep B Core Total Ab: NEGATIVE

## 2014-12-19 LAB — HEPATITIS C ANTIBODY: HCV Ab: <0.1 {s_co_ratio} (ref 0.0–0.9)

## 2014-12-19 LAB — MAGNESIUM: Magnesium: 1.8 mg/dL (ref 1.7–2.4)

## 2014-12-19 LAB — HEPATITIS B SURFACE ANTIGEN: Hepatitis B Surface Ag: NEGATIVE

## 2014-12-19 MED ORDER — INSULIN ASPART 100 UNIT/ML ~~LOC~~ SOLN: 0.0000 [IU] | Freq: Three times a day (TID) | SUBCUTANEOUS | Status: DC

## 2014-12-19 MED ORDER — HEPARIN SODIUM (PORCINE) 1000 UNIT/ML IJ SOLN
3600.0000 [IU] | Freq: Once | INTRAMUSCULAR | Status: AC
  Administered 2014-12-19: 3600 [IU]

## 2014-12-19 MED ORDER — TRACE MINERALS CR-CU-MN-SE-ZN 10-1000-500-60 MCG/ML IV SOLN
INTRAVENOUS | Status: AC
  Administered 2014-12-19 – 2014-12-20 (×2): via INTRAVENOUS
  Filled 2014-12-19 (×7): qty 1680

## 2014-12-19 MED ORDER — VANCOMYCIN HCL IN DEXTROSE 750-5 MG/150ML-% IV SOLN
750.0000 mg | Freq: Once | INTRAVENOUS | Status: AC
  Administered 2014-12-19: 750 mg via INTRAVENOUS
  Filled 2014-12-19 (×2): qty 150

## 2014-12-19 MED ORDER — INSULIN ASPART 100 UNIT/ML ~~LOC~~ SOLN
0.0000 [IU] | SUBCUTANEOUS | Status: DC
  Administered 2014-12-19: 2 [IU] via SUBCUTANEOUS
  Administered 2014-12-19: 1 [IU] via SUBCUTANEOUS
  Administered 2014-12-19 – 2014-12-20 (×3): 2 [IU] via SUBCUTANEOUS
  Filled 2014-12-19 (×3): qty 2
  Filled 2014-12-19: qty 1
  Filled 2014-12-19: qty 2

## 2014-12-19 NOTE — Care Management Note (Signed)
I spoke with Caleb Francis this morning about possible placement for this patient.  At this time the patient is still not ready to determine if long term dialysis is needed and patient is not ESRD at this time. [name]  [phone]

## 2014-12-19 NOTE — Progress Notes (Deleted)
Pt. Discharged to home. Discharge instructions and medication regimen reviewed at bedside with patient and wife. Both able to verbalize understanding of instructions and medication regimen. Using teach back method, pt was able to verbalize at home care of cath site. Pt was ambulated around the nurses station this AM per MD request and tolerated well, see flowsheets. Patient assessment unchanged from this morning. TELE and IV discontinued per policy.

## 2014-12-19 NOTE — Progress Notes (Signed)
Nutrition Follow-up  DOCUMENTATION CODES:  Severe malnutrition in context of acute illness/injury  INTERVENTION:   (Coordination of Care): recommend continuing current TPN at current goal rate; 20% lipids running today, plan for 20% lipids again on Monday  NUTRITION DIAGNOSIS:  Inadequate oral intake related to inability to eat as evidenced by NPO status. Being addressed via TPN  GOAL:  Patient will meet greater than or equal to 90% of their needs   MONITOR:   (PN, Energy Intake, Anthropometrics, Electrolyte/Renal Profile, Glucose Profile, Digestive System)   ASSESSMENT:  Pt received HD yesterday  Diet Order: NPO  Current Nutrition: 5%AA/20%Dextrose at rate of 70 ml/hr with 20% Lipids 2 times weekly, Lipids to infuse today  Gastrointestinal Profile: no N/V, NG in place, flexiseal leaking and replaced this AM, noted 350 mL out since replaced  Medications: reviewed  Electrolyte/Renal Profile and Glucose Profile:   Recent Labs Lab 12/16/14 0524  12/17/14 0453  12/18/14 0637 12/18/14 1353 12/19/14 0800  NA 144  --  143  < > 141 138 139  K 3.0*  < > 3.8  < > 3.8 4.2 3.8  CL 116*  --  123*  < > 112* 109 105  CO2 19*  --  16*  < > 22 20* 25  BUN 89*  --  103*  < > 86* 90* 69*  CREATININE 6.00*  --  6.31*  < > 4.89* 5.11* 4.19*  CALCIUM 8.5*  --  8.5*  < > 8.3* 8.1* 8.7*  MG 1.8  --  1.9  --  1.7  --  1.8  PHOS 4.5  --  3.7  --   --  2.3*  2.2*  --   GLUCOSE 145*  --  157*  < > 169* 133* 369*  < > = values in this interval not displayed. Protein Profile:  Recent Labs Lab 12/14/14 0502 12/18/14 1353  ALBUMIN 2.1* 2.2*     Weight Trend since Admission: Filed Weights   12/18/14 0550 12/18/14 1518 12/19/14 0457  Weight: 138 lb 9.9 oz (62.876 kg) 142 lb 13.7 oz (64.8 kg) 136 lb 14.4 oz (62.097 kg)    Height:  Ht Readings from Last 1 Encounters:  11/29/14 6' (1.829 m)    Weight:  Wt Readings from Last 1 Encounters:  12/19/14 136 lb 14.4 oz (62.097  kg)    BMI:  Body mass index is 18.56 kg/(m^2).  Estimated Nutritional Needs:  Kcal:  2482-5003 kcals (BEE 1436, 1.2 AF, 1.2-1.4 IF) using current wt of 62.9 kg  Protein:  76-95 (1.2-1.5 g/kg)   Fluid:  1575-1890 mL (25-30 ml/kg)   Diet Order:  Diet NPO time specified .TPN (CLINIMIX-E) Adult .TPN (CLINIMIX-E) Adult  EDUCATION NEEDS:  No education needs identified at this time  [place], [place], LDN [phone] Pager

## 2014-12-19 NOTE — Progress Notes (Signed)
  Subjective: Pt denies abdominal pain or nausea.  Flexiseal replaced today  Objective: Vital signs in last 24 hours: Temp:  [97.7 F (36.5 C)-98.3 F (36.8 C)] 98.2 F (36.8 C) (07/07 0457) Pulse Rate:  [92-114] 110 (07/07 0457) Resp:  [17-28] 20 (07/07 0457) BP: (82-109)/(45-66) 109/60 mmHg (07/07 0457) SpO2:  [100 %] 100 % (07/07 0457) Weight:  [136 lb 14.4 oz (62.097 kg)-142 lb 13.7 oz (64.8 kg)] 136 lb 14.4 oz (62.097 kg) (07/07 0457) Last BM Date: 12/17/14 No LMP for male patient. Body mass index is 18.56 kg/(m^2). General:  Alert, Answers questions appropriately today Head:  Normocephalic and atraumatic. Eyes:  +exopthalmos.  Sclera clear, no icterus.   Conjunctiva pink. Mouth:  No deformity or lesions, oropharynx pink & moist. Neck:  Supple; no masses or thyromegaly. Heart:  Regular rate and rhythm; no murmurs, clicks, rubs, or gallops. Abdomen:   +BS x4, soft, mildly distended, nontender  No masses, hepatosplenomegaly or hernias noted.  No guarding or rebound tenderness.   Msk:  Symmetrical Pulses:  Normal pulses noted. Extremities:  No edema.  No cyanosis Neurologic:  Nonverbal, resting quietly Skin:  Intact without significant lesions or rashes.  Intake/Output from previous day: 07/06 0701 - 07/07 0700 In: 2151 [Blood:396; NG/GT:915; TPN:840] Out: 1025 [Urine:925; Emesis/NG output:350]  Lab Results:  Recent Labs  12/18/14 0637 12/18/14 1353 12/19/14 0800  WBC 8.0 8.9 10.3  HGB 6.5* 6.8* 8.8*  HCT 19.9* 20.9* 26.5*  PLT 76* 84* 77*   BMET  Recent Labs  12/18/14 0637 12/18/14 1353 12/19/14 0800  NA 141 138 139  K 3.8 4.2 3.8  CL 112* 109 105  CO2 22 20* 25  GLUCOSE 169* 133* 369*  BUN 86* 90* 69*  CREATININE 4.89* 5.11* 4.19*  CALCIUM 8.3* 8.1* 8.7*   Studies/Results: Dg Abd Portable 1v  12/18/2014   CLINICAL DATA:  NG tube placement  EXAM: PORTABLE ABDOMEN - 1 VIEW  COMPARISON:  12/16/2014  FINDINGS: Right groin dialysis catheter is in place  with the tip overlying the L1-2 disc space, likely within the upper IVC. Left ureteral stent is in place, unchanged. Mild gaseous distention of small bowel loops has increased. No NG tube is visualized.  IMPRESSION: Nonvisualization of the NG tube. Increasing gaseous distention of small bowel loops. Cannot exclude small bowel obstruction.   Electronically Signed   By: Rolm Baptise M.D.   On: 12/18/2014 11:18    Assessment: Colonic ileus & ileus:  Persistent bowel obstruction per KUB yesterday.  NGT/rectal tube intact.  Discussed with both Dr Allen Norris & Dr Darvin Neighbours yesterday.  Pt is not a surgical candidate at this time.  Continue supportive measures.  Plan: No new recommendations  The care of EMILO GRAS will be discussed in direct collaboration with Dr Lucilla Lame, Attending Gastroenterologist.  LOS: 20 days  Vickey Huger  12/19/2014, 11:23 AM [facility]  [address] [place], [place] [address] Phone: [phone] Fax : [phone]

## 2014-12-19 NOTE — Progress Notes (Signed)
Received report from Dialysis RN that pt returning from dialysis. No fluid taken off, BPs low and dialysis RN, Eduard Clos to notify the MD.

## 2014-12-19 NOTE — Progress Notes (Signed)
ANTIBIOTIC CONSULT NOTE - FOLLOW UP  Pharmacy Consult for Vancomycin  Indication: Sepsis  No Known Allergies  Patient Measurements: Height: 6' (182.9 cm) Weight: 136 lb 14.4 oz (62.097 kg) IBW/kg (Calculated) : 77.6 Adjusted Body Weight: n/a  Vital Signs: Temp: 97.5 F (36.4 C) (07/07 1247) Temp Source: Axillary (07/07 1247) BP: 90/67 mmHg (07/07 1504) Pulse Rate: 117 (07/07 1504) Intake/Output from previous day: 07/06 0701 - 07/07 0700 In: 2151 [Blood:396; NG/GT:915; TPN:840] Out: 1025 [Urine:925; Emesis/NG output:350] Intake/Output from this shift: Total I/O In: -  Out: 350 [Stool:350]  Labs:  Recent Labs  12/18/14 0637 12/18/14 1353 12/19/14 0800  WBC 8.0 8.9 10.3  HGB 6.5* 6.8* 8.8*  PLT 76* 84* 77*  CREATININE 4.89* 5.11* 4.19*   Estimated Creatinine Clearance: 15 mL/min (by C-G formula based on Cr of 4.19).  Recent Labs  12/17/14 0453 12/17/14 1726 12/18/14 0637 12/18/14 1601  VANCOTROUGH 26*  --   --  17  VANCORANDOM  --  21 24  --      Microbiology: Recent Results (from the past 720 hour(s))  Culture, blood (single)     Status: None   Collection Time: 11/29/14 11:09 AM  Result Value Ref Range Status   Specimen Description BLOOD  Final   Special Requests NONE  Final   Culture  Setup Time   Final    GRAM NEGATIVE RODS AEROBIC BOTTLE ONLY CRITICAL RESULT CALLED TO, READ BACK BY AND VERIFIED WITH: SANDRA VORBA ON 11/30/14 AT 0825 BY JEF    Culture KLEBSIELLA OXYTOCA AEROBIC BOTTLE ONLY   Final   Report Status 12/04/2014 FINAL  Final   Organism ID, Bacteria KLEBSIELLA OXYTOCA  Final      Susceptibility   Klebsiella oxytoca - MIC*    AMPICILLIN >=32 RESISTANT Resistant     CEFTAZIDIME <=1 SENSITIVE Sensitive     CEFAZOLIN >=64 RESISTANT Resistant     CEFTRIAXONE <=1 SENSITIVE Sensitive     CIPROFLOXACIN <=0.25 SENSITIVE Sensitive     GENTAMICIN <=1 SENSITIVE Sensitive     IMIPENEM <=0.25 SENSITIVE Sensitive     TRIMETH/SULFA <=20  SENSITIVE Sensitive     CEFOXITIN <=4 SENSITIVE Sensitive     PIP/TAZO Value in next row Resistant      RESISTANT128    * KLEBSIELLA OXYTOCA  Urine culture     Status: None   Collection Time: 11/29/14 11:22 AM  Result Value Ref Range Status   Specimen Description URINE, CLEAN CATCH  Final   Special Requests NONE  Final   Culture >=100,000 COLONIES/mL KLEBSIELLA OXYTOCA  Final   Report Status 12/01/2014 FINAL  Final   Organism ID, Bacteria KLEBSIELLA OXYTOCA  Final      Susceptibility   Klebsiella oxytoca - MIC*    AMPICILLIN >=32 RESISTANT Resistant     CEFAZOLIN >=64 RESISTANT Resistant     CEFTRIAXONE 4 SENSITIVE Sensitive     CIPROFLOXACIN <=0.25 SENSITIVE Sensitive     GENTAMICIN <=1 SENSITIVE Sensitive     IMIPENEM <=0.25 SENSITIVE Sensitive     NITROFURANTOIN <=16 SENSITIVE Sensitive     TRIMETH/SULFA <=20 SENSITIVE Sensitive     CEFOXITIN <=4 SENSITIVE Sensitive     * >=100,000 COLONIES/mL KLEBSIELLA OXYTOCA  MRSA PCR Screening     Status: None   Collection Time: 11/29/14 12:13 PM  Result Value Ref Range Status   MRSA by PCR NEGATIVE NEGATIVE Final    Comment:        The GeneXpert MRSA Assay (FDA approved for  NASAL specimens only), is one component of a comprehensive MRSA colonization surveillance program. It is not intended to diagnose MRSA infection nor to guide or monitor treatment for MRSA infections.   Culture, blood (routine x 2)     Status: None   Collection Time: 11/29/14  1:19 PM  Result Value Ref Range Status   Specimen Description BLOOD  Final   Special Requests Immunocompromised  Final   Culture NO GROWTH 5 DAYS  Final   Report Status 12/04/2014 FINAL  Final  Urine culture     Status: None   Collection Time: 11/30/14 10:12 AM  Result Value Ref Range Status   Specimen Description URINE, CATHETERIZED  Final   Special Requests Immunocompromised  Final   Culture NO GROWTH 2 DAYS  Final   Report Status 12/02/2014 FINAL  Final  Culture, blood  (routine x 2)     Status: None   Collection Time: 12/06/14  8:08 PM  Result Value Ref Range Status   Specimen Description BLOOD  Final   Special Requests NONE  Final   Culture  Setup Time   Final    GRAM POSITIVE COCCI IN BOTH AEROBIC AND ANAEROBIC BOTTLES CRITICAL RESULT CALLED TO, READ BACK BY AND VERIFIED WITH: JENNIFER BEZARD AT 3903 12/08/14.PMH CONFIRMED BY RWW    Culture   Final    STAPHYLOCOCCUS AURICULARIS IN BOTH AEROBIC AND ANAEROBIC BOTTLES    Report Status 12/11/2014 FINAL  Final   Organism ID, Bacteria STAPHYLOCOCCUS AURICULARIS  Final      Susceptibility   Staphylococcus auricularis - MIC*    CIPROFLOXACIN >=8 RESISTANT Resistant     ERYTHROMYCIN >=8 RESISTANT Resistant     GENTAMICIN <=0.5 SENSITIVE Sensitive     OXACILLIN >=4 RESISTANT Resistant     TETRACYCLINE <=1 SENSITIVE Sensitive     VANCOMYCIN 1 SENSITIVE Sensitive     CLINDAMYCIN <=0.25 SENSITIVE Sensitive     TRIMETH/SULFA Value in next row Sensitive      SENSITIVE<=20    LEVOFLOXACIN Value in next row Intermediate      INTERMEDIATE4    * STAPHYLOCOCCUS AURICULARIS  Culture, blood (routine x 2)     Status: None   Collection Time: 12/06/14  8:40 PM  Result Value Ref Range Status   Specimen Description BLOOD  Final   Special Requests NONE  Final   Culture NO GROWTH 5 DAYS  Final   Report Status 12/11/2014 FINAL  Final  Culture, blood (routine x 2)     Status: None   Collection Time: 12/08/14 11:40 AM  Result Value Ref Range Status   Specimen Description BLOOD  Final   Special Requests Normal  Final   Culture NO GROWTH 6 DAYS  Final   Report Status 12/14/2014 FINAL  Final  Culture, blood (routine x 2)     Status: None   Collection Time: 12/08/14 11:49 AM  Result Value Ref Range Status   Specimen Description BLOOD  Final   Special Requests Normal  Final   Culture NO GROWTH 6 DAYS  Final   Report Status 12/14/2014 FINAL  Final  C difficile quick scan w PCR reflex ([facility] only)     Status: None    Collection Time: 12/14/14 10:11 AM  Result Value Ref Range Status   C Diff antigen NEGATIVE  Final   C Diff toxin NEGATIVE  Final   C Diff interpretation Negative for C. difficile  Final    Anti-infectives    Start  Dose/Rate Route Frequency Ordered Stop   12/15/14 0630  vancomycin (VANCOCIN) IVPB 750 mg/150 ml premix     750 mg 150 mL/hr over 60 Minutes Intravenous  Once 12/15/14 0616 12/15/14 0726   12/14/14 1300  vancomycin (VANCOCIN) IVPB 750 mg/150 ml premix  Status:  Discontinued     750 mg 150 mL/hr over 60 Minutes Intravenous Every 48 hours 12/13/14 1107 12/14/14 0604   12/14/14 0603  vancomycin (VANCOCIN) IVPB 750 mg/150 ml premix     750 mg 150 mL/hr over 60 Minutes Intravenous Daily PRN 12/14/14 0604     12/09/14 0900  erythromycin 250 mg in sodium chloride 0.9 % 100 mL IVPB  Status:  Discontinued     250 mg 100 mL/hr over 60 Minutes Intravenous 3 times per day 12/09/14 0859 12/13/14 1054   12/08/14 1100  metroNIDAZOLE (FLAGYL) IVPB 500 mg  Status:  Discontinued     500 mg 100 mL/hr over 60 Minutes Intravenous Every 8 hours 12/08/14 0958 12/11/14 1048   12/08/14 0900  vancomycin (VANCOCIN) IVPB 750 mg/150 ml premix  Status:  Discontinued     750 mg 150 mL/hr over 60 Minutes Intravenous Every 24 hours 12/07/14 1102 12/12/14 0955   12/07/14 1030  cefTAZidime (FORTAZ) 2 g in dextrose 5 % 50 mL IVPB  Status:  Discontinued     2 g 100 mL/hr over 30 Minutes Intravenous Every 24 hours 12/07/14 1021 12/11/14 1048   12/07/14 0700  vancomycin (VANCOCIN) IVPB 1000 mg/200 mL premix  Status:  Discontinued     1,000 mg 200 mL/hr over 60 Minutes Intravenous Every 24 hours 12/06/14 2225 12/07/14 1102   12/06/14 2200  piperacillin-tazobactam (ZOSYN) IVPB 4.5 g  Status:  Discontinued     4.5 g 200 mL/hr over 30 Minutes Intravenous 3 times per day 12/06/14 1946 12/07/14 1011   12/06/14 2000  vancomycin (VANCOCIN) IVPB 1000 mg/200 mL premix     1,000 mg 200 mL/hr over 60 Minutes  Intravenous STAT 12/06/14 1947 12/06/14 2113   12/06/14 2000  cefTRIAXone (ROCEPHIN) 1 g in dextrose 5 % 50 mL IVPB - Premix  Status:  Discontinued     1 g 100 mL/hr over 30 Minutes Intravenous Every 24 hours 12/06/14 1959 12/07/14 1011   12/06/14 1945  piperacillin-tazobactam (ZOSYN) IVPB 3.375 g  Status:  Discontinued     3.375 g 100 mL/hr over 30 Minutes Intravenous 4 times per day 12/06/14 1940 12/06/14 2007   12/06/14 1945  vancomycin (VANCOCIN) IVPB 1000 mg/200 mL premix  Status:  Discontinued     1,000 mg 200 mL/hr over 60 Minutes Intravenous Every 12 hours 12/06/14 1940 12/06/14 2008   12/01/14 0945  piperacillin-tazobactam (ZOSYN) IVPB 3.375 g  Status:  Discontinued     3.375 g 12.5 mL/hr over 240 Minutes Intravenous 3 times per day 12/01/14 0936 12/03/14 1337   11/30/14 1000  cefTRIAXone (ROCEPHIN) 2 g in dextrose 5 % 50 mL IVPB - Premix  Status:  Discontinued    Comments:  Entered per MD progress note   2 g 100 mL/hr over 30 Minutes Intravenous Every 24 hours 11/29/14 2157 12/06/14 1940   11/29/14 1415  cefTRIAXone (ROCEPHIN) 1 g in dextrose 5 % 50 mL IVPB - Premix     1 g 100 mL/hr over 30 Minutes Intravenous  Once 11/29/14 1406 11/29/14 1452   11/29/14 1414  cefTRIAXone (ROCEPHIN) 20 MG/ML IVPB 50 mL    Comments:  MONAR, NELLIE: cabinet override  11/29/14 1414 11/29/14 1424      Assessment: Last dose of Vancomycin 750mg  IV was given on 7/3. Vancomycin level after HD on 7/6 was 76mcg/ml. Patient to receive HD again today.  Goal of Therapy:  Vancomycin trough level 15-20 mcg/ml  Plan:  Will give Vancomycin 750mg  IV once today. Patient is to continue Vancomycin through 7/9 so he may not need anymore doses after today, will depend on if he gets dialyzed on Friday.  [name] Fusi, PharmD, BCPS 12/19/2014 3:13 PM

## 2014-12-19 NOTE — Progress Notes (Signed)
PARENTERAL NUTRITION CONSULT NOTE - FOLLOW UP  Pharmacy Consult for TPN - electrolyte and glucose management Indication: Ileus  No Known Allergies  Patient Measurements: Height: 6' (182.9 cm) Weight: 136 lb 14.4 oz (62.097 kg) IBW/kg (Calculated) : 77.6  Vital Signs: Temp: 98.2 F (36.8 C) (07/07 0457) Temp Source: Oral (07/07 0457) BP: 109/60 mmHg (07/07 0457) Pulse Rate: 110 (07/07 0457) Intake/Output from previous day: 07/06 0701 - 07/07 0700 In: 2151 [Blood:396; NG/GT:915; TPN:840] Out: 1025 [Urine:925; Emesis/NG output:350] Intake/Output from this shift: Total I/O In: -  Out: 350 [Stool:350]  Labs:  Recent Labs  12/18/14 0637 12/18/14 1353 12/19/14 0800  WBC 8.0 8.9 10.3  HGB 6.5* 6.8* 8.8*  HCT 19.9* 20.9* 26.5*  PLT 76* 84* 77*     Recent Labs  12/17/14 0453 12/17/14 2015 12/18/14 0637 12/18/14 1353 12/19/14 0800  NA 143 144 141 138 139  K 3.8 3.8 3.8 4.2 3.8  CL 123* 120* 112* 109 105  CO2 16* 14* 22 20* 25  GLUCOSE 157* 137* 169* 133* 369*  BUN 103* 116* 86* 90* 69*  CREATININE 6.31* 6.39* 4.89* 5.11* 4.19*  CALCIUM 8.5* 8.8* 8.3* 8.1* 8.7*  MG 1.9  --  1.7  --  1.8  PHOS 3.7  --   --  2.3*  2.2*  --   ALBUMIN  --   --   --  2.2*  --   ALT  --  15*  --   --   --    Estimated Creatinine Clearance: 15 mL/min (by C-G formula based on Cr of 4.19).    Recent Labs  12/18/14 1807 12/19/14 0022 12/19/14 0558  GLUCAP 143* 147* 155*    Medications:  Scheduled:  . antiseptic oral rinse  7 mL Mouth Rinse BID  . insulin aspart  0-9 Units Subcutaneous TID WC  . methylPREDNISolone (SOLU-MEDROL) injection  60 mg Intravenous Once  . mometasone-formoterol  2 puff Inhalation BID  . pantoprazole (PROTONIX) IV  40 mg Intravenous Q12H  . potassium chloride  20 mEq Oral BID  . scopolamine  1 patch Transdermal Q72H  . thiamine IV  100 mg Intravenous Daily  . ziprasidone  10 mg Intramuscular QHS   Infusions:  . Marland KitchenTPN (CLINIMIX-E) Adult 70 mL/hr at  12/18/14 2356  . Marland KitchenTPN (CLINIMIX-E) Adult    . fat emulsion 500 mL (12/19/14 0807)    Insulin Requirements in the past 24 hours:  Required 7 units of Novolog SS, CBGs range from 143-155  Current Nutrition:  Clinimix E 5/20 at 84ml/hr  Assessment: Patient currently receiving Clinimix E 5/20% (with electrolytes) at 73ml/hr. Electrolytes are within normal limits and glucose is stable.  Plan:  Will continue with current regimen and follow up on am labs.  [name] Fusi, PharmD, BCPS 12/19/2014 11:50 AM

## 2014-12-19 NOTE — Progress Notes (Signed)
[place] [facility]                                                                                                                                                                                            Patient Demographics   Caleb Francis, is a 69 y.o. male, DOB - 04-29-1946, PNT:[id number]  Admit date - 11/29/2014   Admitting Physician Aldean Jewett, MD  Outpatient Primary MD for the patient is No PCP Per Patient   Subjective:   Lethargic, arousable but falling asleep. RN is reporting that she had a good conversation with the patient this a.m. NGT in place.  Review of Systems:   ROS Drowzy. confused  Vitals:   Filed Vitals:   12/19/14 1247 12/19/14 1300 12/19/14 1330 12/19/14 1400  BP: 99/61 95/66 100/61 99/49  Pulse: 112 113 117 110  Temp: 97.5 F (36.4 C)     TempSrc: Axillary     Resp: 20 24 22 18   Height:      Weight:      SpO2:        Wt Readings from Last 3 Encounters:  12/19/14 62.097 kg (136 lb 14.4 oz)  11/27/14 68.2 kg (150 lb 5.7 oz)  11/25/14 70.761 kg (156 lb)     Intake/Output Summary (Last 24 hours) at 12/19/14 1443 Last data filed at 12/19/14 1100  Gross per 24 hour  Intake   1755 ml  Output   1375 ml  Net    380 ml    Physical Exam:   GENERAL: critically ill-appearing PERRLA Atraumatic  NECK: Supple. There is no jugular venous distention. No bruits, no lymphadenopathy, no thyromegaly.  HEART:  Tachycardic 2/6 systolic ejection murmur no rubs LUNGS: Coarse breath sounds. ABDOMEN: Distended bowel hypoactive bowel sounds without rebound or guarding  EXTREMITIES: No evidence of any cyanosis, clubbing,   NEUROLOGIC: Lethargic  SKIN: Moist and warm with no rashes appreciated.  Psych:    no agitation reported overnight.  Radiology Reports 12/10/2014: CT abdomen: Large distended colonic ileus. No obstruction  12/12/2014: KUB   CBC  Recent Labs Lab 12/14/14 0502  12/15/14 0529  12/16/14 0524 12/17/14 0453 12/17/14 2015 12/18/14 0637 12/18/14 1353 12/19/14 0800  WBC 10.0  --  9.4 9.4 7.7 8.3 8.0 8.9 10.3  HGB 7.3*  < > 7.5* 7.1* 6.9* 7.0* 6.5* 6.8* 8.8*  HCT 22.4*  < > 22.6* 22.1* 21.5* 21.7* 19.9* 20.9* 26.5*  PLT 36*  --  43* 59* 72* 86* 76* 84* 77*  MCV 87.7  --  84.7 85.9 86.8 87.1 84.7 85.9 86.6  MCH  28.6  --  27.9 27.6 27.7 28.0 27.9 27.9 28.7  MCHC 32.6  --  33.0 32.1 31.9* 32.1 33.0 32.5 33.2  RDW 18.2*  --  20.3* 20.2* 20.0* 19.3* 19.0* 19.0* 18.4*  LYMPHSABS 1.4  --  0.8 1.3  --   --  0.7  --  1.2  MONOABS 0.6  --  0.2 0.5  --   --  0.8  --  0.7  EOSABS 0.0  --  0.0 0.0  --   --  0.0  --  0.2  BASOSABS 0.0  --  0.0 0.0  --   --  0.0  --  0.0  < > = values in this interval not displayed.  Chemistries   Recent Labs Lab 12/14/14 0502  12/16/14 0524  12/17/14 0453 12/17/14 2015 12/18/14 0637 12/18/14 1353 12/19/14 0800  NA 143  < > 144  --  143 144 141 138 139  K 3.1*  < > 3.0*  < > 3.8 3.8 3.8 4.2 3.8  CL 112*  < > 116*  --  123* 120* 112* 109 105  CO2 24  < > 19*  --  16* 14* 22 20* 25  GLUCOSE 158*  < > 145*  --  157* 137* 169* 133* 369*  BUN 78*  < > 89*  --  103* 116* 86* 90* 69*  CREATININE 4.91*  < > 6.00*  --  6.31* 6.39* 4.89* 5.11* 4.19*  CALCIUM 8.0*  < > 8.5*  --  8.5* 8.8* 8.3* 8.1* 8.7*  MG 2.3  --  1.8  --  1.9  --  1.7  --  1.8  AST 12*  --   --   --   --   --   --   --   --   ALT 9*  --   --   --   --  15*  --   --   --   ALKPHOS 114  --   --   --   --   --   --   --   --   BILITOT 0.6  --   --   --   --   --   --   --   --   < > = values in this interval not displayed. -------------------------------------------------------------------------------------------------------------------  Recent Labs  12/17/14 1226  TSH 0.698   ------------------------------------------------------------------------------------------------------------------  Coagulation profile No results for input(s): INR, PROTIME in the last 168  hours.   Assessment & Plan  This is a 69 year old male with mantle cell lymphoma undergoing chemotherapy who was admitted June 15 from oncology clinic with altered mental status and chest pain.   # Acute renal failure due to ATN : Worsening Patient's creatinine continues to worsen. Had hemodialysis yesterday. Scheduled for hemodialysis today as well Follow-up with nephrology.  # Sepsis: With Klebsiella UTI and bacteremia and Staph bacteremia Patient's urine culture and blood cultures were positive for Klebsiella on admission. [place]. Repeat blood cx on 12/06/2014 are growing  STAPHYLOCOCCUS AURICULARIS . Vancomycin till 12/21/2014.  # Acute encephalopathy - Metabolic encephalopathy due to sepsis and urinary tract infection/ARF/Hypernatremia along with ICU delirium. - CT head/MRI brain showing no evidence of acute pathology.   # Chest pain on admission: No EKG changes to suggest ischemia.  Continue to monitor for any further cardiac symptoms  # Mantle cell lymphoma: Patient is on salvage chemotherapy. Patient follows with Dr. Mike Gip.   He is s/p chemo He  was on GCSF and no longer neutropenic.    # Panctytopenia: Due to chemotherapy and Mantle cell lymphoma, patient is currently on Neupogen. Patient is s/p 2 units PRBC on 6/25 and 1 unit 12/14/2014 and has received PLT transfusion now stopped Patient needs premedication prior to any transfusions including Benadryl and Tylenol.   # Thrombocytopenia This is due to sepsis/lymphoma and chemo Improving, platelets -77,000  # colonic Ileus with GI bleed: CT scan shows colonic ileus that has worsened. Patient is no longer neutropenic. Rectal tube in place. Started daily miralax.  Persistent bowel obstruction per KUB yesterday.Pt is not a surgical candidate at this time. Continue supportive measures.   # Hypernatremia - resolved  # GI bleed: Patient currently has an NG tube placed. PPI. High risk for re bleeding due to  thrombocytopenia.  # Acute blood loss anmeia over pancytopenia Due to GI losses and lymphoma.  Overall Prognosis is very poor   DNR  DVT Prophylaxis   SCD's    Lab Results  Component Value Date   PLT 77* 12/19/2014     Poor prognosis  Total time spent 35 minutes  Greater than 50% of time spent in care and coordination  Nicholes Mango M.D on 12/19/2014 at 2:43 PM  Between 7am to 6pm - Pager - [phone]  After 6pm go to [email or web address] - password [email or web address] [facility]   Office  [phone]

## 2014-12-19 NOTE — Progress Notes (Addendum)
Notified Dr. Margaretmary Eddy of DG abd results from yesterday, asked about follow up xray to verify ng tube placement. MD to place orders.   Dr. Margaretmary Eddy updated with abd xray results.

## 2014-12-19 NOTE — Progress Notes (Signed)
Subjective:  Pt slightly more awake today.  Due for HD again today.  Has soft mitts on hands.   Objective:  Vital signs in last 24 hours:  Temp:  [97.5 F (36.4 C)-98.2 F (36.8 C)] 97.5 F (36.4 C) (07/07 1247) Pulse Rate:  [92-113] 113 (07/07 1300) Resp:  [17-28] 24 (07/07 1300) BP: (82-109)/(45-66) 95/66 mmHg (07/07 1300) SpO2:  [100 %] 100 % (07/07 0457) Weight:  [62.097 kg (136 lb 14.4 oz)-64.8 kg (142 lb 13.7 oz)] 62.097 kg (136 lb 14.4 oz) (07/07 0457)  Weight change: 1.3 kg (2 lb 13.9 oz) Filed Weights   12/18/14 0550 12/18/14 1518 12/19/14 0457  Weight: 62.876 kg (138 lb 9.9 oz) 64.8 kg (142 lb 13.7 oz) 62.097 kg (136 lb 14.4 oz)    Intake/Output: I/O last 3 completed shifts: In: 2991 [Blood:396; NG/GT:915] Out: 1720 [Urine:1525; Emesis/NG output:445]   Intake/Output this shift:  Total I/O In: -  Out: 350 [Stool:350]  Physical Exam: General: awake, ill appearing  Head: NG in place, oral mucosa dry  Eyes: Anicteric  Neck: Supple, trachea midline  Lungs:  Clear to auscultation normal effort  Heart: S1S2 no rubs  Abdomen:  Soft, NTND, BS present  Extremities: Trace LE edema  Neurologic: Awake, does respond verbally  Skin: No acute rashes  Access: Right femoral temp HD catheter    Basic Metabolic Panel:  Recent Labs Lab 12/13/14 0448  12/14/14 0502  12/16/14 0524  12/17/14 0453 12/17/14 2015 12/18/14 0637 12/18/14 1353 12/19/14 0800  NA 145  --  143  < > 144  --  143 144 141 138 139  K 3.4*  < > 3.1*  < > 3.0*  < > 3.8 3.8 3.8 4.2 3.8  CL 111  --  112*  < > 116*  --  123* 120* 112* 109 105  CO2 23  --  24  < > 19*  --  16* 14* 22 20* 25  GLUCOSE 192*  --  158*  < > 145*  --  157* 137* 169* 133* 369*  BUN 64*  --  78*  < > 89*  --  103* 116* 86* 90* 69*  CREATININE 4.26*  --  4.91*  < > 6.00*  --  6.31* 6.39* 4.89* 5.11* 4.19*  CALCIUM 8.7*  --  8.0*  < > 8.5*  --  8.5* 8.8* 8.3* 8.1* 8.7*  MG 2.2  --  2.3  --  1.8  --  1.9  --  1.7  --  1.8   PHOS 6.2*  --  5.9*  --  4.5  --  3.7  --   --  2.3*  2.2*  --   < > = values in this interval not displayed.  Liver Function Tests:  Recent Labs Lab 12/14/14 0502 12/17/14 2015 12/18/14 1353  AST 12*  --   --   ALT 9* 15*  --   ALKPHOS 114  --   --   BILITOT 0.6  --   --   PROT 5.3*  --   --   ALBUMIN 2.1*  --  2.2*   No results for input(s): LIPASE, AMYLASE in the last 168 hours. No results for input(s): AMMONIA in the last 168 hours.  CBC:  Recent Labs Lab 12/14/14 0502  12/15/14 0529 12/16/14 0524 12/17/14 0453 12/17/14 2015 12/18/14 0637 12/18/14 1353 12/19/14 0800  WBC 10.0  --  9.4 9.4 7.7 8.3 8.0 8.9 10.3  NEUTROABS 8.0*  --  8.4* 7.6  --   --  6.5  --  8.2*  HGB 7.3*  < > 7.5* 7.1* 6.9* 7.0* 6.5* 6.8* 8.8*  HCT 22.4*  < > 22.6* 22.1* 21.5* 21.7* 19.9* 20.9* 26.5*  MCV 87.7  --  84.7 85.9 86.8 87.1 84.7 85.9 86.6  PLT 36*  --  43* 59* 72* 86* 76* 84* 77*  < > = values in this interval not displayed.  Cardiac Enzymes: No results for input(s): CKTOTAL, CKMB, CKMBINDEX, TROPONINI in the last 168 hours.  BNP: Invalid input(s): POCBNP  CBG:  Recent Labs Lab 12/18/14 1146 12/18/14 1807 12/19/14 0022 12/19/14 0558 12/19/14 1156  GLUCAP 138* 143* 147* 155* 158*    Microbiology: Results for orders placed or performed during the hospital encounter of 11/29/14  MRSA PCR Screening     Status: None   Collection Time: 11/29/14 12:13 PM  Result Value Ref Range Status   MRSA by PCR NEGATIVE NEGATIVE Final    Comment:        The GeneXpert MRSA Assay (FDA approved for NASAL specimens only), is one component of a comprehensive MRSA colonization surveillance program. It is not intended to diagnose MRSA infection nor to guide or monitor treatment for MRSA infections.   Culture, blood (routine x 2)     Status: None   Collection Time: 11/29/14  1:19 PM  Result Value Ref Range Status   Specimen Description BLOOD  Final   Special Requests  Immunocompromised  Final   Culture NO GROWTH 5 DAYS  Final   Report Status 12/04/2014 FINAL  Final  Urine culture     Status: None   Collection Time: 11/30/14 10:12 AM  Result Value Ref Range Status   Specimen Description URINE, CATHETERIZED  Final   Special Requests Immunocompromised  Final   Culture NO GROWTH 2 DAYS  Final   Report Status 12/02/2014 FINAL  Final  Culture, blood (routine x 2)     Status: None   Collection Time: 12/06/14  8:08 PM  Result Value Ref Range Status   Specimen Description BLOOD  Final   Special Requests NONE  Final   Culture  Setup Time   Final    GRAM POSITIVE COCCI IN BOTH AEROBIC AND ANAEROBIC BOTTLES CRITICAL RESULT CALLED TO, READ BACK BY AND VERIFIED WITH: JENNIFER BEZARD AT 6948 12/08/14.PMH CONFIRMED BY [facility]    Culture   Final    STAPHYLOCOCCUS AURICULARIS IN BOTH AEROBIC AND ANAEROBIC BOTTLES    Report Status 12/11/2014 FINAL  Final   Organism ID, Bacteria STAPHYLOCOCCUS AURICULARIS  Final      Susceptibility   Staphylococcus auricularis - MIC*    CIPROFLOXACIN >=8 RESISTANT Resistant     ERYTHROMYCIN >=8 RESISTANT Resistant     GENTAMICIN <=0.5 SENSITIVE Sensitive     OXACILLIN >=4 RESISTANT Resistant     TETRACYCLINE <=1 SENSITIVE Sensitive     VANCOMYCIN 1 SENSITIVE Sensitive     CLINDAMYCIN <=0.25 SENSITIVE Sensitive     TRIMETH/SULFA Value in next row Sensitive      SENSITIVE<=20    LEVOFLOXACIN Value in next row Intermediate      INTERMEDIATE4    * STAPHYLOCOCCUS AURICULARIS  Culture, blood (routine x 2)     Status: None   Collection Time: 12/06/14  8:40 PM  Result Value Ref Range Status   Specimen Description BLOOD  Final   Special Requests NONE  Final   Culture NO GROWTH 5 DAYS  Final   Report Status 12/11/2014  FINAL  Final  Culture, blood (routine x 2)     Status: None   Collection Time: 12/08/14 11:40 AM  Result Value Ref Range Status   Specimen Description BLOOD  Final   Special Requests Normal  Final   Culture NO  GROWTH 6 DAYS  Final   Report Status 12/14/2014 FINAL  Final  Culture, blood (routine x 2)     Status: None   Collection Time: 12/08/14 11:49 AM  Result Value Ref Range Status   Specimen Description BLOOD  Final   Special Requests Normal  Final   Culture NO GROWTH 6 DAYS  Final   Report Status 12/14/2014 FINAL  Final  C difficile quick scan w PCR reflex ([facility] only)     Status: None   Collection Time: 12/14/14 10:11 AM  Result Value Ref Range Status   C Diff antigen NEGATIVE  Final   C Diff toxin NEGATIVE  Final   C Diff interpretation Negative for C. difficile  Final    Coagulation Studies: No results for input(s): LABPROT, INR in the last 72 hours.  Urinalysis: No results for input(s): COLORURINE, LABSPEC, PHURINE, GLUCOSEU, HGBUR, BILIRUBINUR, KETONESUR, PROTEINUR, UROBILINOGEN, NITRITE, LEUKOCYTESUR in the last 72 hours.  Invalid input(s): APPERANCEUR    Imaging: Dg Abd Portable 1v  12/19/2014   CLINICAL DATA:  69 year old male. Lymphoma. Hypertension. Nasogastric tube placement. Subsequent encounter.  EXAM: PORTABLE ABDOMEN - 1 VIEW  COMPARISON:  12/18/2014.  FINDINGS: Motion degraded portable examination reveals nasogastric tube tip just beyond gastroesophageal junction.  Persistent gas distended small bowel loops measuring up to 4.3 cm may represent result of small bowel obstruction.  The possibility of free intraperitoneal air cannot be assessed on a supine view.  Right femoral line tip to the right of the L1-2 disc space.  Left double-J ureteral stent in place.  IMPRESSION: Nasogastric  tube tip just beyond gastroesophageal junction.  Persistent gas distended small bowel loops measuring up to 4.3 cm may represent result of small bowel obstruction.  Please see above.   Electronically Signed   By: Genia Del M.D.   On: 12/19/2014 12:26   Dg Abd Portable 1v  12/18/2014   CLINICAL DATA:  NG tube placement  EXAM: PORTABLE ABDOMEN - 1 VIEW  COMPARISON:  12/16/2014  FINDINGS: Right  groin dialysis catheter is in place with the tip overlying the L1-2 disc space, likely within the upper IVC. Left ureteral stent is in place, unchanged. Mild gaseous distention of small bowel loops has increased. No NG tube is visualized.  IMPRESSION: Nonvisualization of the NG tube. Increasing gaseous distention of small bowel loops. Cannot exclude small bowel obstruction.   Electronically Signed   By: Rolm Baptise M.D.   On: 12/18/2014 11:18     Medications:   . Marland KitchenTPN (CLINIMIX-E) Adult 70 mL/hr at 12/18/14 2356  . Marland KitchenTPN (CLINIMIX-E) Adult    . fat emulsion 500 mL (12/19/14 0807)   . antiseptic oral rinse  7 mL Mouth Rinse BID  . insulin aspart  0-9 Units Subcutaneous Q4H  . methylPREDNISolone (SOLU-MEDROL) injection  60 mg Intravenous Once  . mometasone-formoterol  2 puff Inhalation BID  . pantoprazole (PROTONIX) IV  40 mg Intravenous Q12H  . potassium chloride  20 mEq Oral BID  . scopolamine  1 patch Transdermal Q72H  . thiamine IV  100 mg Intravenous Daily  . ziprasidone  10 mg Intramuscular QHS   sodium chloride, sodium chloride, sodium chloride, acetaminophen **OR** acetaminophen, albuterol, haloperidol lactate, heparin,  heparin lock flush, lidocaine (PF), lidocaine-prilocaine, magnesium hydroxide, metoprolol, morphine injection, ondansetron (ZOFRAN) IV, promethazine, sodium chloride, sodium chloride, sodium chloride, vancomycin, ziprasidone  Assessment/ Plan:  4 AAM with progressive mantle cell lymphoma, RICE chemo in 11/2014, hx left sided hydronephrosis and hydroureter. S/p ureteral stent placement    1.  Acute renal failure due to ATN: Pt for HD again today, continue to HD to see if this improves his mental status.  He does appear to be a bit more awake today.  2.  Sepsis with klebsiella oxytoca: completed two weeks for IV ceftazidime. Appreciate ID input. Last vancomycin 7/3   3.  Anemia unspecified D64.9: hgb up to 8.8 post transfusion, continue to monitor.  4. Hypokalemia:  no potassium in TPN. K 3.8, continue 4K bath with HD.      LOS: 20 Beva Remund 7/7/20161:27 PM

## 2014-12-19 NOTE — Care Management (Signed)
Patient is a little more alert this day.  Ileus is not resolving and gi continues with conservative treatment as patient is not a surgical candidate.  Continue to consider LTAC but at present, patient would not be medically stable for transfer.  Received HD today

## 2014-12-20 LAB — CBC WITH DIFFERENTIAL/PLATELET
Band Neutrophils: 4 % (ref 0–10)
Basophils Absolute: 0 10*3/uL (ref 0.0–0.1)
Basophils Relative: 0 % (ref 0–1)
Blasts: 0 %
Eosinophils Absolute: 0 10*3/uL (ref 0.0–0.7)
Eosinophils Relative: 0 % (ref 0–5)
HCT: 25.2 % — ABNORMAL LOW (ref 40.0–52.0)
Hemoglobin: 8.3 g/dL — ABNORMAL LOW (ref 13.0–18.0)
Lymphocytes Relative: 17 % (ref 12–46)
Lymphs Abs: 1.9 10*3/uL (ref 0.7–4.0)
MCH: 28.5 pg (ref 26.0–34.0)
MCHC: 33.1 g/dL (ref 32.0–36.0)
MCV: 86.2 fL (ref 80.0–100.0)
Metamyelocytes Relative: 0 %
Monocytes Absolute: 1.8 10*3/uL — ABNORMAL HIGH (ref 0.1–1.0)
Monocytes Relative: 16 % — ABNORMAL HIGH (ref 3–12)
Myelocytes: 1 %
Neutro Abs: 7.7 10*3/uL (ref 1.7–7.7)
Neutrophils Relative %: 62 % (ref 43–77)
Other: 0 %
Platelets: 104 10*3/uL — ABNORMAL LOW (ref 150–440)
Promyelocytes Absolute: 0 %
RBC: 2.93 MIL/uL — ABNORMAL LOW (ref 4.40–5.90)
RDW: 18.6 % — ABNORMAL HIGH (ref 11.5–14.5)
WBC: 11.4 10*3/uL — ABNORMAL HIGH (ref 3.8–10.6)
nRBC: 0 /100 WBC

## 2014-12-20 LAB — BASIC METABOLIC PANEL
Anion gap: 11 (ref 5–15)
BUN: 45 mg/dL — ABNORMAL HIGH (ref 6–20)
CALCIUM: 8.5 mg/dL — AB (ref 8.9–10.3)
CO2: 26 mmol/L (ref 22–32)
Chloride: 101 mmol/L (ref 101–111)
Creatinine, Ser: 3.1 mg/dL — ABNORMAL HIGH (ref 0.61–1.24)
GFR, EST AFRICAN AMERICAN: 22 mL/min — AB (ref >60)
GFR, EST NON AFRICAN AMERICAN: 19 mL/min — AB (ref >60)
Glucose, Bld: 141 mg/dL — ABNORMAL HIGH (ref 65–99)
POTASSIUM: 3.3 mmol/L — AB (ref 3.5–5.1)
Sodium: 138 mmol/L (ref 135–145)

## 2014-12-20 LAB — GLUCOSE, CAPILLARY
GLUCOSE-CAPILLARY: 116 mg/dL — AB (ref 65–99)
GLUCOSE-CAPILLARY: 163 mg/dL — AB (ref 65–99)
GLUCOSE-CAPILLARY: 118 mg/dL — AB (ref 65–99)
Glucose-Capillary: 157 mg/dL — ABNORMAL HIGH (ref 65–99)
Glucose-Capillary: 154 mg/dL — ABNORMAL HIGH (ref 65–99)
Glucose-Capillary: 136 mg/dL — ABNORMAL HIGH (ref 65–99)

## 2014-12-20 MED ORDER — INSULIN ASPART 100 UNIT/ML ~~LOC~~ SOLN
0.0000 [IU] | Freq: Four times a day (QID) | SUBCUTANEOUS | Status: DC
  Administered 2014-12-21: 2 [IU] via SUBCUTANEOUS
  Administered 2014-12-21: 1 [IU] via SUBCUTANEOUS
  Administered 2014-12-21: 2 [IU] via SUBCUTANEOUS
  Administered 2014-12-22 (×2): 1 [IU] via SUBCUTANEOUS
  Administered 2014-12-23: 2 [IU] via SUBCUTANEOUS
  Administered 2014-12-23 – 2014-12-24 (×3): 1 [IU] via SUBCUTANEOUS
  Administered 2014-12-24 (×2): 2 [IU] via SUBCUTANEOUS
  Administered 2014-12-24 – 2014-12-25 (×4): 1 [IU] via SUBCUTANEOUS
  Administered 2014-12-26 – 2014-12-29 (×9): 2 [IU] via SUBCUTANEOUS
  Administered 2014-12-29: 1 [IU] via SUBCUTANEOUS
  Administered 2014-12-31 – 2015-01-04 (×6): 2 [IU] via SUBCUTANEOUS
  Administered 2015-01-06: 1 [IU] via SUBCUTANEOUS
  Administered 2015-01-07: 2 [IU] via SUBCUTANEOUS
  Administered 2015-01-07: 1 [IU] via SUBCUTANEOUS
  Administered 2015-01-07: 2 [IU] via SUBCUTANEOUS
  Administered 2015-01-08: 1 [IU] via SUBCUTANEOUS
  Filled 2014-12-20 (×4): qty 1
  Filled 2014-12-20 (×3): qty 2
  Filled 2014-12-20 (×2): qty 1
  Filled 2014-12-20 (×5): qty 2
  Filled 2014-12-20 (×2): qty 1
  Filled 2014-12-20: qty 2
  Filled 2014-12-20: qty 1
  Filled 2014-12-20 (×2): qty 2
  Filled 2014-12-20: qty 1
  Filled 2014-12-20: qty 2
  Filled 2014-12-20: qty 1
  Filled 2014-12-20 (×2): qty 2
  Filled 2014-12-20 (×2): qty 1
  Filled 2014-12-20: qty 2
  Filled 2014-12-20: qty 1
  Filled 2014-12-20 (×6): qty 2
  Filled 2014-12-20: qty 1
  Filled 2014-12-20 (×2): qty 2

## 2014-12-20 MED ORDER — POTASSIUM CHLORIDE 20 MEQ PO PACK
20.0000 meq | PACK | Freq: Two times a day (BID) | ORAL | Status: AC
Start: 2014-12-20 — End: 2014-12-20
  Administered 2014-12-20 (×2): 20 meq via ORAL
  Filled 2014-12-20 (×2): qty 1

## 2014-12-20 MED ORDER — TRACE MINERALS CR-CU-MN-SE-ZN 10-1000-500-60 MCG/ML IV SOLN
INTRAVENOUS | Status: AC
  Administered 2014-12-20: 18:00:00 via INTRAVENOUS
  Filled 2014-12-20: qty 1680

## 2014-12-20 NOTE — Consult Note (Signed)
Palliative Medicine Inpatient Consult Follow Up Note   Name: Caleb Francis Date: 12/20/2014 MRN: [id number]  DOB: February 23, 1946  Referring Physician: Nicholes Mango, MD  Palliative Care consult requested for this 69 y.o. male for goals of medical therapy in patient with mantle cell lymphoma and complications of recent sepsis (now on HD and getting TPN and has NG for chronic ileus).       CODE STATUS: DNR   PAST MEDICAL HISTORY: Past Medical History  Diagnosis Date  . Arthritis   . Hypertension   . RA (rheumatoid arthritis)   . Anemia   . Mantle cell lymphoma     Dr Mike Gip  . History of nuclear stress test     a. 12/2013: low risk, no sig ischemia, no EKG changes, no artifact, EF 63%  . Chronic kidney disease   . Sepsis     Dr Ola Spurr    PAST SURGICAL HISTORY:  Past Surgical History  Procedure Laterality Date  . Back surgery    . Fracture surgery      ankle  . Portacath placement    . Cystoscopy w/ ureteral stent placement Left 10/24/2014    Procedure: CYSTOSCOPY WITH RETROGRADE PYELOGRAM/URETERAL STENT PLACEMENT;  Surgeon: Irine Seal, MD;  Location: [facility] ORS;  Service: Urology;  Laterality: Left;    Vital Signs: BP 93/51 mmHg  Pulse 102  Temp(Src) 97.9 F (36.6 C) (Oral)  Resp 18  Ht 6' (1.829 m)  Wt 58.196 kg (128 lb 4.8 oz)  BMI 17.40 kg/m2  SpO2 100% Filed Weights   12/19/14 0457 12/19/14 1629 12/20/14 0411  Weight: 62.097 kg (136 lb 14.4 oz) 59.784 kg (131 lb 12.8 oz) 58.196 kg (128 lb 4.8 oz)    Estimated body mass index is 17.4 kg/(m^2) as calculated from the following:   Height as of this encounter: 6' (1.829 m).   Weight as of this encounter: 58.196 kg (128 lb 4.8 oz).  PHYSICAL EXAM: NAD Verbal and alert but mildly confused  EOMI OP clear Neck w/o JVD or thyromegaly Heart rrr no mgr Lungs cta no rales Abd has NG sounds and is slightly distended Ext muscle wasting is evident  LABS: CBC:    Component Value Date/Time   WBC 11.4*  12/20/2014 0513   WBC 6.4 07/17/2014 1121   HGB 8.3* 12/20/2014 0513   HGB 11.8* 07/17/2014 1121   HCT 25.2* 12/20/2014 0513   HCT 35.0* 07/17/2014 1121   PLT 104* 12/20/2014 0513   PLT 263 07/17/2014 1121   MCV 86.2 12/20/2014 0513   MCV 83 07/17/2014 1121   NEUTROABS 7.7 12/20/2014 0513   NEUTROABS 3.4 07/17/2014 1121   LYMPHSABS 1.9 12/20/2014 0513   LYMPHSABS 1.9 07/17/2014 1121   MONOABS 1.8* 12/20/2014 0513   MONOABS 0.8 07/17/2014 1121   EOSABS 0.0 12/20/2014 0513   EOSABS 0.2 07/17/2014 1121   BASOSABS 0.0 12/20/2014 0513   BASOSABS 0.0 07/17/2014 1121   Comprehensive Metabolic Panel:    Component Value Date/Time   NA 138 12/20/2014 0513   NA 141 07/17/2014 1121   K 3.3* 12/20/2014 0513   K 2.8* 07/17/2014 1121   CL 101 12/20/2014 0513   CL 100 07/17/2014 1121   CO2 26 12/20/2014 0513   CO2 32 07/17/2014 1121   BUN 45* 12/20/2014 0513   BUN 9 07/17/2014 1121   CREATININE 3.10* 12/20/2014 0513   CREATININE 1.09 07/17/2014 1121   GLUCOSE 141* 12/20/2014 0513   GLUCOSE 105* 07/17/2014 1121  CALCIUM 8.5* 12/20/2014 0513   CALCIUM 8.9 07/17/2014 1121   AST 12* 12/14/2014 0502   AST 17 07/17/2014 1121   ALT 15* 12/17/2014 2015   ALT 12* 07/17/2014 1121   ALKPHOS 114 12/14/2014 0502   ALKPHOS 79 07/17/2014 1121   BILITOT 0.6 12/14/2014 0502   PROT 5.3* 12/14/2014 0502   PROT 7.3 07/17/2014 1121   ALBUMIN 2.2* 12/18/2014 1353   ALBUMIN 3.1* 07/17/2014 1121    IMPRESSION:  Mantle Cell Lymphoma  --responsive in early June to salvage chemo and so was to be considered for bone marrow transplant' --since then, he has become critically ill with sepsis , metabolic encephalopathy, acute renal failure causing him to need dialysis, and an ongoing colonic ileus keeping him with an NG to suction and keeping him on TPN.  --time is passing and he may not any longer be a candidate for BM transplant due to progression of tumor and due to very poor performance status. Dr.  Mike Gip is waiting to hear back from the [facility] transplant team at [facility].  --he is expected to have progression of his disease and patient's insurance insists on clear goals for progression of condition before he could be considered for [facility] placement, so this is not likely to take place --SNF placement could take place, but TPN is not usually provided in these settings and ongoing dialysis would limit placement also.    Sepsis and UTI (klebsiella UTI and Bacteremia dn Staph Bacteremia --treated with help of ID with last Vanco planned to be through 7/9.   Acute renal failure due to ATN -now on hemodialysis  Longlasting colonic Ileus --had associated GI bleed initially  --got rectal tube and miralax --not a surgical candidate --improved at first with NG to suction --now not improving rapidly and NG feedings not initiated  --getting TPN  Hypernatremia --resolvied  GI bleed --high risk due to low platelets --NG tube to suction --PPI  Probable ischemic cardiomyopathy --last echo showed EF 35% --Severe AR and Moderate MR   PLAN:  --IF he is NOT a candidate for BM transplant, and IF his lymphoma is progressively worsening,  then he would again be appropriate for Hospice Home (with TPN and dialysis discontinued). BUT, I spoke with pt's daughter, Caleb Francis, today and she stated that they would not want to stop the TPN or the NG tube since they feel this is keeping him from discomfort of a swollen stomach.    --There is also the dynamic of the previous decision to place him at [facility] --only to have that decision changed when he seemed to bounce back / rally so well in early June. They may not feel that [facility] is appropriate because they may hold onto the idea of him bouncing back a second time just like he did the first time. However, I have found family to be very receptive to having discussions about the reality of patient's situation.     Goals today were to simply explain  some of the issues as they were elaborated by Dr. Mike Gip when she and I spoke earlier in the day.  [facility] has not called back apparently, but it is thought likely they will state that he is not any longer a candidate for consideration of a BMT.  Patient is improving in terms of awareness, but his performance scale is still quite low.    NOTE: There will not be any Palliative Care coverage until June 25th.  But, if patient becomes appropriate again  for Hospice Care, please consult Care Management to arrange a Hospice Consult.     More than 50% of the visit was spent in counseling/coordination of care: YES  Time Spent: 45 minutes

## 2014-12-20 NOTE — Progress Notes (Signed)
[facility] Hematology/Oncology Progress Note  Date of admission: 11/29/2014  Hospital day:  12/19/2014  Chief Complaint: Caleb Francis is a 69 y.o. 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  Subjective:  Interactive.  Talking in sentences.  States watching some TV, but unable to provide specifics.  States eating and sitting in chair today (patient confused).  Unaware of dialysis issues but aware of catheter placement.  Denies pain.    Social History: The patient is alone today.  Allergies: No Known Allergies  Scheduled Medications: . antiseptic oral rinse  7 mL Mouth Rinse BID  . insulin aspart  0-9 Units Subcutaneous Q4H  . methylPREDNISolone (SOLU-MEDROL) injection  60 mg Intravenous Once  . mometasone-formoterol  2 puff Inhalation BID  . pantoprazole (PROTONIX) IV  40 mg Intravenous Q12H  . potassium chloride  20 mEq Oral BID  . scopolamine  1 patch Transdermal Q72H  . thiamine IV  100 mg Intravenous Daily  . ziprasidone  10 mg Intramuscular QHS   Review of Systems: GENERAL:  More talkative.  No fevers, sweats or weight loss.  Denies pain. PERFORMANCE STATUS (ECOG):  4 HEENT:  No visual changes, runny nose, sore throat, mouth sores or tenderness. Lungs: No shortness of breath or cough.  No hemoptysis. Cardiac:  No chest pain, palpitations, orthopnea, or PND. GI:  States that he has been eating soft food.  Denies abdominal pain. GU:  Denies urinary symptoms.  Musculoskeletal:  No back or joint pain.  Extremities:  No pain or swelling. Skin:  Denies skin issues. Neuro:  No headache.  General weakness. Pain:  No focal pain. Review of systems:  All other systems reviewed and found to be negative.  Physical Exam: Blood pressure 99/68, pulse 113, temperature 98.3 F (36.8 C), temperature source Oral, resp. rate 22, height 6' (1.829 m), weight 131 lb  12.8 oz (59.784 kg), SpO2 100 %.  GENERAL: Chronically ill appearing gentleman lying in bed on the telemetry unit in no acute distress. MENTAL STATUS:Answers questions.  More easily understood. HEAD: Thin short graying hair. Normocephalic, atraumatic, face symmetric, no Cushingoid features. EYES: Brown eyes. Pupils equal round and reactive to light and accomodation. No conjunctivitis or scleral icterus. ENT: NG tube in place. Oropharynx clear. Poor dentition.  RESPIRATORY: Decreased breath sounds at the bases.  No wheezes. CARDIOVASCULAR: Regular rate and rhythm without murmur, rub or gallop. ABDOMEN: Not distended.  Soft, minimal bowel sounds, and no hepatosplenomegaly. No tenderness. No guarding or rebound tenderness.  No masses.  Rectal tube in place. SKIN: No rashes, ulcers or lesions. EXTREMITIES: Mits on hands.  No edema, no skin discoloration or tenderness. No palpable cords. NEUROLOGICAL:Interactive. Follows commands.  Confused.  Results for orders placed or performed during the hospital encounter of 11/29/14 (from the past 48 hour(s))  Glucose, capillary     Status: Abnormal   Collection Time: 12/18/14  6:01 AM  Result Value Ref Range   Glucose-Capillary 166 (H) 65 - 99 mg/dL  Basic metabolic panel     Status: Abnormal   Collection Time: 12/18/14  6:37 AM  Result Value Ref Range   Sodium 141 135 - 145 mmol/L   Potassium 3.8 3.5 - 5.1 mmol/L   Chloride 112 (H) 101 - 111 mmol/L   CO2 22 22 - 32 mmol/L   Glucose, Bld 169 (H) 65 - 99 mg/dL   BUN 86 (  H) 6 - 20 mg/dL   Creatinine, Ser 4.89 (H) 0.61 - 1.24 mg/dL   Calcium 8.3 (L) 8.9 - 10.3 mg/dL   GFR calc non Af Amer 11 (L) >60 mL/min   GFR calc Af Amer 13 (L) >60 mL/min    Comment: (NOTE) The eGFR has been calculated using the CKD EPI equation. This calculation has not been validated in all clinical situations. eGFR's persistently <60 mL/min signify possible Chronic Kidney Disease.    Anion gap 7 5 - 15   Magnesium     Status: None   Collection Time: 12/18/14  6:37 AM  Result Value Ref Range   Magnesium 1.7 1.7 - 2.4 mg/dL  CBC with Differential     Status: Abnormal   Collection Time: 12/18/14  6:37 AM  Result Value Ref Range   WBC 8.0 3.8 - 10.6 K/uL   RBC 2.34 (L) 4.40 - 5.90 MIL/uL   Hemoglobin 6.5 (L) 13.0 - 18.0 g/dL   HCT 19.9 (L) 40.0 - 52.0 %   MCV 84.7 80.0 - 100.0 fL   MCH 27.9 26.0 - 34.0 pg   MCHC 33.0 32.0 - 36.0 g/dL   RDW 19.0 (H) 11.5 - 14.5 %   Platelets 76 (L) 150 - 440 K/uL   Neutrophils Relative % 77 43 - 77 %   Lymphocytes Relative 9 (L) 12 - 46 %   Monocytes Relative 10 3 - 12 %   Eosinophils Relative 0 0 - 5 %   Basophils Relative 0 0 - 1 %   Band Neutrophils 1 0 - 10 %   Metamyelocytes Relative 1 %   Myelocytes 2 %   Promyelocytes Absolute 0 %   Blasts 0 %   nRBC 0 0 /100 WBC   Other 0 %   Neutro Abs 6.5 1.7 - 7.7 K/uL   Lymphs Abs 0.7 0.7 - 4.0 K/uL   Monocytes Absolute 0.8 0.1 - 1.0 K/uL   Eosinophils Absolute 0.0 0.0 - 0.7 K/uL   Basophils Absolute 0.0 0.0 - 0.1 K/uL   RBC Morphology MIXED RBC POPULATION    Smear Review PLATELETS APPEAR DECREASED   Type and screen     Status: None   Collection Time: 12/18/14  6:37 AM  Result Value Ref Range   ABO/RH(D) O POS    Antibody Screen NEG    Sample Expiration 12/21/2014    Unit Number [id number]    Blood Component Type RBC, LR IRR    Unit division 00    Status of Unit ISSUED,FINAL    Transfusion Status OK TO TRANSFUSE    Crossmatch Result Compatible   Vancomycin, random     Status: None   Collection Time: 12/18/14  6:37 AM  Result Value Ref Range   Vancomycin Rm 24 ug/mL    Comment:        Random Vancomycin therapeutic range is dependent on dosage and time of specimen collection. A peak range is 20.0-40.0 ug/mL A trough range is 5.0-15.0 ug/mL          Prepare RBC     Status: None   Collection Time: 12/18/14  6:37 AM  Result Value Ref Range   Order Confirmation ORDER PROCESSED BY  BLOOD BANK   Glucose, capillary     Status: Abnormal   Collection Time: 12/18/14 11:46 AM  Result Value Ref Range   Glucose-Capillary 138 (H) 65 - 99 mg/dL   Comment 1 Notify RN   Renal function panel  Status: Abnormal   Collection Time: 12/18/14  1:53 PM  Result Value Ref Range   Sodium 138 135 - 145 mmol/L   Potassium 4.2 3.5 - 5.1 mmol/L   Chloride 109 101 - 111 mmol/L   CO2 20 (L) 22 - 32 mmol/L   Glucose, Bld 133 (H) 65 - 99 mg/dL   BUN 90 (H) 6 - 20 mg/dL   Creatinine, Ser 5.11 (H) 0.61 - 1.24 mg/dL   Calcium 8.1 (L) 8.9 - 10.3 mg/dL   Phosphorus 2.3 (L) 2.5 - 4.6 mg/dL   Albumin 2.2 (L) 3.5 - 5.0 g/dL   GFR calc non Af Amer 11 (L) >60 mL/min   GFR calc Af Amer 12 (L) >60 mL/min    Comment: (NOTE) The eGFR has been calculated using the CKD EPI equation. This calculation has not been validated in all clinical situations. eGFR's persistently <60 mL/min signify possible Chronic Kidney Disease.    Anion gap 9 5 - 15  CBC     Status: Abnormal   Collection Time: 12/18/14  1:53 PM  Result Value Ref Range   WBC 8.9 3.8 - 10.6 K/uL   RBC 2.43 (L) 4.40 - 5.90 MIL/uL   Hemoglobin 6.8 (L) 13.0 - 18.0 g/dL   HCT 20.9 (L) 40.0 - 52.0 %   MCV 85.9 80.0 - 100.0 fL   MCH 27.9 26.0 - 34.0 pg   MCHC 32.5 32.0 - 36.0 g/dL   RDW 19.0 (H) 11.5 - 14.5 %   Platelets 84 (L) 150 - 440 K/uL  Phosphorus     Status: Abnormal   Collection Time: 12/18/14  1:53 PM  Result Value Ref Range   Phosphorus 2.2 (L) 2.5 - 4.6 mg/dL  Vancomycin, trough     Status: None   Collection Time: 12/18/14  4:01 PM  Result Value Ref Range   Vancomycin Tr 17 10 - 20 ug/mL  Glucose, capillary     Status: Abnormal   Collection Time: 12/18/14  6:07 PM  Result Value Ref Range   Glucose-Capillary 143 (H) 65 - 99 mg/dL   Comment 1 Notify RN   Glucose, capillary     Status: Abnormal   Collection Time: 12/19/14 12:22 AM  Result Value Ref Range   Glucose-Capillary 147 (H) 65 - 99 mg/dL  Glucose, capillary      Status: Abnormal   Collection Time: 12/19/14  5:58 AM  Result Value Ref Range   Glucose-Capillary 155 (H) 65 - 99 mg/dL  CBC with Differential     Status: Abnormal   Collection Time: 12/19/14  8:00 AM  Result Value Ref Range   WBC 10.3 3.8 - 10.6 K/uL   RBC 3.06 (L) 4.40 - 5.90 MIL/uL   Hemoglobin 8.8 (L) 13.0 - 18.0 g/dL    Comment: RESULT REPEATED AND VERIFIED   HCT 26.5 (L) 40.0 - 52.0 %   MCV 86.6 80.0 - 100.0 fL   MCH 28.7 26.0 - 34.0 pg   MCHC 33.2 32.0 - 36.0 g/dL   RDW 18.4 (H) 11.5 - 14.5 %   Platelets 77 (L) 150 - 440 K/uL   Neutrophils Relative % 74 43 - 77 %   Lymphocytes Relative 12 12 - 46 %   Monocytes Relative 7 3 - 12 %   Eosinophils Relative 2 0 - 5 %   Basophils Relative 0 0 - 1 %   Band Neutrophils 2 0 - 10 %   Metamyelocytes Relative 2 %   Myelocytes 1 %  Promyelocytes Absolute 0 %   Blasts 0 %   nRBC 0 0 /100 WBC   Other 0 %   Neutro Abs 8.2 (H) 1.7 - 7.7 K/uL   Lymphs Abs 1.2 0.7 - 4.0 K/uL   Monocytes Absolute 0.7 0.1 - 1.0 K/uL   Eosinophils Absolute 0.2 0.0 - 0.7 K/uL   Basophils Absolute 0.0 0.0 - 0.1 K/uL  Basic metabolic panel     Status: Abnormal   Collection Time: 12/19/14  8:00 AM  Result Value Ref Range   Sodium 139 135 - 145 mmol/L   Potassium 3.8 3.5 - 5.1 mmol/L   Chloride 105 101 - 111 mmol/L   CO2 25 22 - 32 mmol/L   Glucose, Bld 369 (H) 65 - 99 mg/dL   BUN 69 (H) 6 - 20 mg/dL   Creatinine, Ser 4.19 (H) 0.61 - 1.24 mg/dL   Calcium 8.7 (L) 8.9 - 10.3 mg/dL   GFR calc non Af Amer 13 (L) >60 mL/min   GFR calc Af Amer 16 (L) >60 mL/min    Comment: (NOTE) The eGFR has been calculated using the CKD EPI equation. This calculation has not been validated in all clinical situations. eGFR's persistently <60 mL/min signify possible Chronic Kidney Disease.    Anion gap 9 5 - 15  Magnesium     Status: None   Collection Time: 12/19/14  8:00 AM  Result Value Ref Range   Magnesium 1.8 1.7 - 2.4 mg/dL  Glucose, capillary     Status:  Abnormal   Collection Time: 12/19/14 11:56 AM  Result Value Ref Range   Glucose-Capillary 158 (H) 65 - 99 mg/dL   Comment 1 Notify RN   Glucose, capillary     Status: Abnormal   Collection Time: 12/19/14  5:29 PM  Result Value Ref Range   Glucose-Capillary 107 (H) 65 - 99 mg/dL   Comment 1 Notify RN   Glucose, capillary     Status: Abnormal   Collection Time: 12/19/14  8:57 PM  Result Value Ref Range   Glucose-Capillary 172 (H) 65 - 99 mg/dL   Comment 1 Notify RN   Glucose, capillary     Status: Abnormal   Collection Time: 12/19/14 11:39 PM  Result Value Ref Range   Glucose-Capillary 129 (H) 65 - 99 mg/dL   Comment 1 Notify RN    Dg Abd Portable 1v  12/19/2014   CLINICAL DATA:  69 year old male. Lymphoma. Hypertension. Nasogastric tube placement. Subsequent encounter.  EXAM: PORTABLE ABDOMEN - 1 VIEW  COMPARISON:  12/18/2014.  FINDINGS: Motion degraded portable examination reveals nasogastric tube tip just beyond gastroesophageal junction.  Persistent gas distended small bowel loops measuring up to 4.3 cm may represent result of small bowel obstruction.  The possibility of free intraperitoneal air cannot be assessed on a supine view.  Right femoral line tip to the right of the L1-2 disc space.  Left double-J ureteral stent in place.  IMPRESSION: Nasogastric  tube tip just beyond gastroesophageal junction.  Persistent gas distended small bowel loops measuring up to 4.3 cm may represent result of small bowel obstruction.  Please see above.   Electronically Signed   By: Genia Del M.D.   On: 12/19/2014 12:26   Dg Abd Portable 1v  12/18/2014   CLINICAL DATA:  NG tube placement  EXAM: PORTABLE ABDOMEN - 1 VIEW  COMPARISON:  12/16/2014  FINDINGS: Right groin dialysis catheter is in place with the tip overlying the L1-2 disc space, likely within the  upper IVC. Left ureteral stent is in place, unchanged. Mild gaseous distention of small bowel loops has increased. No NG tube is visualized.   IMPRESSION: Nonvisualization of the NG tube. Increasing gaseous distention of small bowel loops. Cannot exclude small bowel obstruction.   Electronically Signed   By: Rolm Baptise M.D.   On: 12/18/2014 11:18    Assessment:  Caleb Francis is a 69 y.o. male with relapsed cell lymphoma currently day 23 status post cycle #2 RICE chemotherapy. He was admitted with Klebsiella sepsis due to pyelonephritis and associated altered mental status. He subsequently developed renal insufficiency with assciated hypernatremia. He is out of the ICU and on the telemetry unit.  He has a history of ICU psychosis and altered mental status secondary to hypernatremia.  He has progressive renal failure.  Dialysis was started on 12/18/2014.  He had a platelet transfusion reaction on 12/06/2014. Blood cultures from 12/06/2014 grew staph auricularis (coag negative staph) . He had an upper GI bleed on 12/07/2014. He last received PRBCs on 12/18/2014. Platelet count is adequate.  WBC and ANC are normal off GCSF.  He has an ileus and has G tube and is s/p rectal tube placement on 12/12/2014. Plan: 1. Hematology/Oncology: Day 34 s/p chemotherapy. WBC is normal.  Patient not neutropenic.GCSF discontinued 12/12/2014.  Platelet count adequate.  Received PRBCs on 12/18/2014.  Premed all blood products with Tylenol and Benadryl. 2. Infectious disease: Klebsiella oxytoca bacteremia treated.  Cetazidime completed.  Vancomycin continues until 07/09 for coag negative staph.  No evidence of endocarditis by echo. No further positive blood cultures. 3. Nephrology: Hypernatremia resolved.  Progressive renal insufficiency s/p initiation of dialysis. Mental status has improved, but still confused.  Do not feel that patient is a long term dialysis candidate.  All medications renally adjusted. 4. Neurology:  Confused, but improved on dialysis.  5. Gastroenterology: UGI bleed resolved. Ileus s/p rectal tube.  TPN started  12/12/2014. 6. Disposition: Prognosis extremely poor.  Hospital day 21.  Confused.  Patient on dialysis.  Unable to eat secondary to ileus. He has not been out of bed (debiliated).  Concern about patient's ability to proceed to transplant in future given aggressive nature of lymphoma and delay in therapy resulting in eventual growth of disease.  Await call back from bone marrow transplant team at [facility].  DNR code status.   Lequita Asal, MD

## 2014-12-20 NOTE — Progress Notes (Signed)
NPO. Tachy. Room air. NG tube on intermittment suction. Foley. Flexiseal. Pt has not reported any pain. TPN @ 70. Verbal at times. Pt has no further concerns at this time.

## 2014-12-20 NOTE — Consult Note (Signed)
Palliative Medicine Inpatient Consult Follow Up Note   Name: Caleb Francis Date: 12/20/2014 MRN: [id number]  DOB: 05-04-1946  Referring Physician: Nicholes Mango, MD  Palliative Care consult requested for this 69 y.o. male for goals of medical therapy in patient with mantle cell lymphoma, ARF on HD, sepsis in recovery phase, AMS, and a now chronic colonic ileus with TPN being given.  He is seen for palliative evaluation as goals of care may be changing as new developments and decisions about possible treatments arise or become impossible.  (See plan).    REVIEW OF SYSTEMS:  Patient is not able to provide ROS due to being nonverbal today  CODE STATUS: DNR   PAST MEDICAL HISTORY: Past Medical History  Diagnosis Date  . Arthritis   . Hypertension   . RA (rheumatoid arthritis)   . Anemia   . Mantle cell lymphoma     Dr Mike Gip  . History of nuclear stress test     a. 12/2013: low risk, no sig ischemia, no EKG changes, no artifact, EF 63%  . Chronic kidney disease   . Sepsis     Dr Ola Spurr    PAST SURGICAL HISTORY:  Past Surgical History  Procedure Laterality Date  . Back surgery    . Fracture surgery      ankle  . Portacath placement    . Cystoscopy w/ ureteral stent placement Left 10/24/2014    Procedure: CYSTOSCOPY WITH RETROGRADE PYELOGRAM/URETERAL STENT PLACEMENT;  Surgeon: Irine Seal, MD;  Location: [facility] ORS;  Service: Urology;  Laterality: Left;    Vital Signs: BP 93/51 mmHg  Pulse 102  Temp(Src) 97.9 F (36.6 C) (Oral)  Resp 18  Ht 6' (1.829 m)  Wt 58.196 kg (128 lb 4.8 oz)  BMI 17.40 kg/m2  SpO2 100% Filed Weights   12/19/14 0457 12/19/14 1629 12/20/14 0411  Weight: 62.097 kg (136 lb 14.4 oz) 59.784 kg (131 lb 12.8 oz) 58.196 kg (128 lb 4.8 oz)    Estimated body mass index is 17.4 kg/(m^2) as calculated from the following:   Height as of this encounter: 6' (1.829 m).   Weight as of this encounter: 58.196 kg (128 lb 4.8 oz).  PHYSICAL EXAM: He is  more alert today, but he was still nonverbal when I visited, though I heard he spoke with others.  No family is present today to talk with.  NAD NG to wall suction EOMI Focuses and nods No JVD or thyromegaly Heart rrr no mgr Lungs cta with some ronchi Abd soft and NT but somewhat distended Skin warm and dry   IMPRESSION: Mantle Cell Lymphoma  --responsive in early June to salvage chemo and so was to be considered for bone marrow transplant' --since then, he has become critically ill with sepsis , metabolic encephalopathy, acute renal failure causing him to need dialysis, and an ongoing colonic ileus keeping him with an NG to suction and keeping him on TPN.   --time is passing and he may not any longer be a candidate for BM transplant due to progression of tumor and due to very poor performance status.  Dr. Mike Gip is waiting to hear back from the [facility] transplant team at [facility].  --IF he is not a candidate for BM transplant, then he would again be appropriate for Hospice Home, with TPN and dialysis discontinued. Family (and patient --if patient become oriented enough to participate more in decisions) may be reluctant to discontinue feedings and dialysis.  And, if his ileus resolves, he  could eat, and still get dialysis until completely futile,which would not take long w/o a BM transplant apparently.  --There is also the dynamic of the previous decision to place him at [facility] --only to have that decision changed when he seemed to bounce back / rally and then opt for more chemo and consideration of BMT.  They may not feel that [place] is appropriate because they may hold onto the idea of him bouncing back a second time just like he did the first time.     PLAN: --IF he is not a candidate for BM transplant, then he would again be appropriate for Hospice Home, with TPN and dialysis discontinued. Family (and patient --if patient become oriented enough to participate more in decisions) may be  reluctant to discontinue feedings and dialysis.  And, if his ileus resolves, he could eat, and still get dialysis until completely futile,which would not take long w/o a BM transplant apparently.  --There is also the dynamic of the previous decision to place him at [facility] --only to have that decision changed when he seemed to bounce back / rally and then opt for more chemo and consideration of BMT.  They may not feel that [place] is appropriate because they may hold onto the idea of him bouncing back a second time just like he did the first time.   REFERRALS TO BE ORDERED:  None as yet.  Hospice Home and SNF are being talked about.   More than 50% of the visit was spent in counseling/coordination of care: YES  Time Spent: 35 min

## 2014-12-20 NOTE — Progress Notes (Signed)
Subjective:  Pt seems a bit more awake today. Had HD yesterday. It was terminated slightly early yesterday.  Objective:  Vital signs in last 24 hours:  Temp:  [97.5 F (36.4 C)-98.3 F (36.8 C)] 97.9 F (36.6 C) (07/08 1125) Pulse Rate:  [102-117] 102 (07/08 1125) Resp:  [14-29] 18 (07/08 1125) BP: (80-100)/(49-78) 93/51 mmHg (07/08 1125) SpO2:  [97 %-100 %] 100 % (07/08 0411) Weight:  [58.196 kg (128 lb 4.8 oz)-59.784 kg (131 lb 12.8 oz)] 58.196 kg (128 lb 4.8 oz) (07/08 0411)  Weight change: -5.016 kg (-11 lb 0.9 oz) Filed Weights   12/19/14 0457 12/19/14 1629 12/20/14 0411  Weight: 62.097 kg (136 lb 14.4 oz) 59.784 kg (131 lb 12.8 oz) 58.196 kg (128 lb 4.8 oz)    Intake/Output: I/O last 3 completed shifts: In: 3565.2 [NG/GT:840] Out: 2528 [Urine:1425; Emesis/NG output:500; Stool:625]   Intake/Output this shift:  Total I/O In: -  Out: 250 [Urine:250]  Physical Exam: General: awake, ill appearing  Head: NG in place, oral mucosa dry  Eyes: Anicteric  Neck: Supple, trachea midline  Lungs:  Clear to auscultation normal effort  Heart: S1S2 no rubs  Abdomen:  Soft, NTND, BS present  Extremities: Trace LE edema  Neurologic: Awake, following simple commands  Skin: No acute rashes  Access: Right femoral temp HD catheter    Basic Metabolic Panel:  Recent Labs Lab 12/14/14 0502  12/16/14 0524  12/17/14 0453 12/17/14 2015 12/18/14 0637 12/18/14 1353 12/19/14 0800 12/20/14 0513  NA 143  < > 144  --  143 144 141 138 139 138  K 3.1*  < > 3.0*  < > 3.8 3.8 3.8 4.2 3.8 3.3*  CL 112*  < > 116*  --  123* 120* 112* 109 105 101  CO2 24  < > 19*  --  16* 14* 22 20* 25 26  GLUCOSE 158*  < > 145*  --  157* 137* 169* 133* 369* 141*  BUN 78*  < > 89*  --  103* 116* 86* 90* 69* 45*  CREATININE 4.91*  < > 6.00*  --  6.31* 6.39* 4.89* 5.11* 4.19* 3.10*  CALCIUM 8.0*  < > 8.5*  --  8.5* 8.8* 8.3* 8.1* 8.7* 8.5*  MG 2.3  --  1.8  --  1.9  --  1.7  --  1.8  --   PHOS 5.9*  --   4.5  --  3.7  --   --  2.3*  2.2*  --   --   < > = values in this interval not displayed.  Liver Function Tests:  Recent Labs Lab 12/14/14 0502 12/17/14 2015 12/18/14 1353  AST 12*  --   --   ALT 9* 15*  --   ALKPHOS 114  --   --   BILITOT 0.6  --   --   PROT 5.3*  --   --   ALBUMIN 2.1*  --  2.2*   No results for input(s): LIPASE, AMYLASE in the last 168 hours. No results for input(s): AMMONIA in the last 168 hours.  CBC:  Recent Labs Lab 12/15/14 0529 12/16/14 0524  12/17/14 2015 12/18/14 0637 12/18/14 1353 12/19/14 0800 12/20/14 0513  WBC 9.4 9.4  < > 8.3 8.0 8.9 10.3 11.4*  NEUTROABS 8.4* 7.6  --   --  6.5  --  8.2* 7.7  HGB 7.5* 7.1*  < > 7.0* 6.5* 6.8* 8.8* 8.3*  HCT 22.6* 22.1*  < > 21.7*  19.9* 20.9* 26.5* 25.2*  MCV 84.7 85.9  < > 87.1 84.7 85.9 86.6 86.2  PLT 43* 59*  < > 86* 76* 84* 77* 104*  < > = values in this interval not displayed.  Cardiac Enzymes: No results for input(s): CKTOTAL, CKMB, CKMBINDEX, TROPONINI in the last 168 hours.  BNP: Invalid input(s): POCBNP  CBG:  Recent Labs Lab 12/19/14 2057 12/19/14 2339 12/20/14 0351 12/20/14 0748 12/20/14 1127  GLUCAP 172* 129* 163* 157* 118*    Microbiology: Results for orders placed or performed during the hospital encounter of 11/29/14  MRSA PCR Screening     Status: None   Collection Time: 11/29/14 12:13 PM  Result Value Ref Range Status   MRSA by PCR NEGATIVE NEGATIVE Final    Comment:        The GeneXpert MRSA Assay (FDA approved for NASAL specimens only), is one component of a comprehensive MRSA colonization surveillance program. It is not intended to diagnose MRSA infection nor to guide or monitor treatment for MRSA infections.   Culture, blood (routine x 2)     Status: None   Collection Time: 11/29/14  1:19 PM  Result Value Ref Range Status   Specimen Description BLOOD  Final   Special Requests Immunocompromised  Final   Culture NO GROWTH 5 DAYS  Final   Report Status  12/04/2014 FINAL  Final  Urine culture     Status: None   Collection Time: 11/30/14 10:12 AM  Result Value Ref Range Status   Specimen Description URINE, CATHETERIZED  Final   Special Requests Immunocompromised  Final   Culture NO GROWTH 2 DAYS  Final   Report Status 12/02/2014 FINAL  Final  Culture, blood (routine x 2)     Status: None   Collection Time: 12/06/14  8:08 PM  Result Value Ref Range Status   Specimen Description BLOOD  Final   Special Requests NONE  Final   Culture  Setup Time   Final    GRAM POSITIVE COCCI IN BOTH AEROBIC AND ANAEROBIC BOTTLES CRITICAL RESULT CALLED TO, READ BACK BY AND VERIFIED WITH: JENNIFER BEZARD AT 0814 12/08/14.PMH CONFIRMED BY RWW    Culture   Final    STAPHYLOCOCCUS AURICULARIS IN BOTH AEROBIC AND ANAEROBIC BOTTLES    Report Status 12/11/2014 FINAL  Final   Organism ID, Bacteria STAPHYLOCOCCUS AURICULARIS  Final      Susceptibility   Staphylococcus auricularis - MIC*    CIPROFLOXACIN >=8 RESISTANT Resistant     ERYTHROMYCIN >=8 RESISTANT Resistant     GENTAMICIN <=0.5 SENSITIVE Sensitive     OXACILLIN >=4 RESISTANT Resistant     TETRACYCLINE <=1 SENSITIVE Sensitive     VANCOMYCIN 1 SENSITIVE Sensitive     CLINDAMYCIN <=0.25 SENSITIVE Sensitive     TRIMETH/SULFA Value in next row Sensitive      SENSITIVE<=20    LEVOFLOXACIN Value in next row Intermediate      INTERMEDIATE4    * STAPHYLOCOCCUS AURICULARIS  Culture, blood (routine x 2)     Status: None   Collection Time: 12/06/14  8:40 PM  Result Value Ref Range Status   Specimen Description BLOOD  Final   Special Requests NONE  Final   Culture NO GROWTH 5 DAYS  Final   Report Status 12/11/2014 FINAL  Final  Culture, blood (routine x 2)     Status: None   Collection Time: 12/08/14 11:40 AM  Result Value Ref Range Status   Specimen Description BLOOD  Final  Special Requests Normal  Final   Culture NO GROWTH 6 DAYS  Final   Report Status 12/14/2014 FINAL  Final  Culture, blood  (routine x 2)     Status: None   Collection Time: 12/08/14 11:49 AM  Result Value Ref Range Status   Specimen Description BLOOD  Final   Special Requests Normal  Final   Culture NO GROWTH 6 DAYS  Final   Report Status 12/14/2014 FINAL  Final  C difficile quick scan w PCR reflex ([facility] only)     Status: None   Collection Time: 12/14/14 10:11 AM  Result Value Ref Range Status   C Diff antigen NEGATIVE  Final   C Diff toxin NEGATIVE  Final   C Diff interpretation Negative for C. difficile  Final    Coagulation Studies: No results for input(s): LABPROT, INR in the last 72 hours.  Urinalysis: No results for input(s): COLORURINE, LABSPEC, PHURINE, GLUCOSEU, HGBUR, BILIRUBINUR, KETONESUR, PROTEINUR, UROBILINOGEN, NITRITE, LEUKOCYTESUR in the last 72 hours.  Invalid input(s): APPERANCEUR    Imaging: Dg Abd Portable 1v  12/19/2014   CLINICAL DATA:  69 year old male. Lymphoma. Hypertension. Nasogastric tube placement. Subsequent encounter.  EXAM: PORTABLE ABDOMEN - 1 VIEW  COMPARISON:  12/18/2014.  FINDINGS: Motion degraded portable examination reveals nasogastric tube tip just beyond gastroesophageal junction.  Persistent gas distended small bowel loops measuring up to 4.3 cm may represent result of small bowel obstruction.  The possibility of free intraperitoneal air cannot be assessed on a supine view.  Right femoral line tip to the right of the L1-2 disc space.  Left double-J ureteral stent in place.  IMPRESSION: Nasogastric  tube tip just beyond gastroesophageal junction.  Persistent gas distended small bowel loops measuring up to 4.3 cm may represent result of small bowel obstruction.  Please see above.   Electronically Signed   By: Genia Del M.D.   On: 12/19/2014 12:26     Medications:   . Marland KitchenTPN (CLINIMIX-E) Adult 70 mL/hr at 12/20/14 0120  . Marland KitchenTPN (CLINIMIX-E) Adult     . antiseptic oral rinse  7 mL Mouth Rinse BID  . insulin aspart  0-9 Units Subcutaneous Q4H  . methylPREDNISolone  (SOLU-MEDROL) injection  60 mg Intravenous Once  . mometasone-formoterol  2 puff Inhalation BID  . pantoprazole (PROTONIX) IV  40 mg Intravenous Q12H  . potassium chloride  20 mEq Oral BID  . potassium chloride  20 mEq Oral BID  . scopolamine  1 patch Transdermal Q72H  . thiamine IV  100 mg Intravenous Daily  . ziprasidone  10 mg Intramuscular QHS   sodium chloride, sodium chloride, sodium chloride, acetaminophen **OR** acetaminophen, albuterol, haloperidol lactate, heparin, heparin lock flush, lidocaine (PF), lidocaine-prilocaine, magnesium hydroxide, metoprolol, morphine injection, ondansetron (ZOFRAN) IV, promethazine, sodium chloride, sodium chloride, sodium chloride, vancomycin, ziprasidone  Assessment/ Plan:  78 AAM with progressive mantle cell lymphoma, RICE chemo in 11/2014, hx left sided hydronephrosis and hydroureter. S/p ureteral stent placement    1.  Acute renal failure due to ATN: Pt has undergone 3 dialysis sessions.  Mental status does appear slightly improved.  Will consider HD again tomorrow. Pt has been making urine but not clearing toxin.    2.  Sepsis with klebsiella oxytoca: completed two weeks for IV ceftazidime.  Appears resolved.    3.  Anemia unspecified D64.9: hgb 8.3 at present.  Consider epogen, but defer this decision to hematology.  4. Hypokalemia: no potassium in TPN. K down to 3.3, however pt is on  K repletion.      LOS: [place], Cynthis Purington 7/8/201611:43 AM

## 2014-12-20 NOTE — Progress Notes (Signed)
  Subjective: Pt denies abdominal pain or nausea.  NG output decreased.    Objective: Vital signs in last 24 hours: Temp:  [97.9 F (36.6 C)-98.3 F (36.8 C)] 97.9 F (36.6 C) (07/08 1125) Pulse Rate:  [102-117] 102 (07/08 1125) Resp:  [14-29] 18 (07/08 1125) BP: (80-100)/(49-78) 93/51 mmHg (07/08 1125) SpO2:  [97 %-100 %] 100 % (07/08 0411) Weight:  [128 lb 4.8 oz (58.196 kg)-131 lb 12.8 oz (59.784 kg)] 128 lb 4.8 oz (58.196 kg) (07/08 0411) Last BM Date: 12/19/14 No LMP for male patient. Body mass index is 17.4 kg/(m^2). General:  Alert, Answers questions appropriately today Head:  Normocephalic and atraumatic. Eyes:  +exopthalmos.  Sclera clear, no icterus.   Conjunctiva pink. Mouth:  No deformity or lesions, oropharynx pink & moist. Neck:  Supple; no masses or thyromegaly. Heart:  Regular rate and rhythm; no murmurs, clicks, rubs, or gallops. Abdomen:   +BS x4, soft, mildly distended, nontender  No masses, hepatosplenomegaly or hernias noted.  No guarding or rebound tenderness.   Msk:  Symmetrical Pulses:  Normal pulses noted. Extremities:  No edema.  No cyanosis Neurologic:  Nonverbal, resting quietly Skin:  Intact without significant lesions or rashes.  Intake/Output from previous day: 07/07 0701 - 07/08 0700 In: 2725.2 [LKT:6256.3] Out: 8937 [Urine:1000; Emesis/NG output:150; Stool:625]  Lab Results:  Recent Labs  12/18/14 1353 12/19/14 0800 12/20/14 0513  WBC 8.9 10.3 11.4*  HGB 6.8* 8.8* 8.3*  HCT 20.9* 26.5* 25.2*  PLT 84* 77* 104*   BMET  Recent Labs  12/18/14 1353 12/19/14 0800 12/20/14 0513  NA 138 139 138  K 4.2 3.8 3.3*  CL 109 105 101  CO2 20* 25 26  GLUCOSE 133* 369* 141*  BUN 90* 69* 45*  CREATININE 5.11* 4.19* 3.10*  CALCIUM 8.1* 8.7* 8.5*   Studies/Results: Dg Abd Portable 1v  12/19/2014   CLINICAL DATA:  69 year old male. Lymphoma. Hypertension. Nasogastric tube placement. Subsequent encounter.  EXAM: PORTABLE ABDOMEN - 1 VIEW   COMPARISON:  12/18/2014.  FINDINGS: Motion degraded portable examination reveals nasogastric tube tip just beyond gastroesophageal junction.  Persistent gas distended small bowel loops measuring up to 4.3 cm may represent result of small bowel obstruction.  The possibility of free intraperitoneal air cannot be assessed on a supine view.  Right femoral line tip to the right of the L1-2 disc space.  Left double-J ureteral stent in place.  IMPRESSION: Nasogastric  tube tip just beyond gastroesophageal junction.  Persistent gas distended small bowel loops measuring up to 4.3 cm may represent result of small bowel obstruction.  Please see above.   Electronically Signed   By: Genia Del M.D.   On: 12/19/2014 12:26    Assessment: Colonic ileus & ileus:  Persistent bowel obstruction in complicated case.  NGT/rectal tube intact.  Discussed with Dr Allen Norris.  Pt is not a surgical candidate at this time.   Plan: Nothing new to add from a GI standpoint for bowel obstruction at this time. We will sign off.  Please call if any further GI concerns or questions.  We would like to thank you for the opportunity to participate in the care of SHIRO ELLERMAN.  LOS: 21 days  Vickey Huger  12/20/2014, 12:57 PM [facility]  [place], [place] [address] Phone: [phone] Fax : [phone]

## 2014-12-20 NOTE — Progress Notes (Signed)
ANTIBIOTIC CONSULT NOTE - FOLLOW UP  Pharmacy Consult for Vancomycin Indication: Sepsis  No Known Allergies  Patient Measurements: Height: 6' (182.9 cm) Weight: 128 lb 4.8 oz (58.196 kg) IBW/kg (Calculated) : 77.6 Adjusted Body Weight: n/a  Vital Signs: Temp: 97.9 F (36.6 C) (07/08 1125) Temp Source: Oral (07/08 0411) BP: 93/51 mmHg (07/08 1125) Pulse Rate: 102 (07/08 1125) Intake/Output from previous day: 07/07 0701 - 07/08 0700 In: 2725.2 [MVE:7209.4] Out: 7096 [Urine:1000; Emesis/NG output:150; Stool:625] Intake/Output from this shift: Total I/O In: -  Out: 250 [Urine:250]  Labs:  Recent Labs  12/18/14 1353 12/19/14 0800 12/20/14 0513  WBC 8.9 10.3 11.4*  HGB 6.8* 8.8* 8.3*  PLT 84* 77* 104*  CREATININE 5.11* 4.19* 3.10*   Estimated Creatinine Clearance: 19 mL/min (by C-G formula based on Cr of 3.1).  Recent Labs  12/17/14 1726 12/18/14 0637 12/18/14 1601  VANCOTROUGH  --   --  17  VANCORANDOM 21 24  --      Microbiology: Recent Results (from the past 720 hour(s))  Culture, blood (single)     Status: None   Collection Time: 11/29/14 11:09 AM  Result Value Ref Range Status   Specimen Description BLOOD  Final   Special Requests NONE  Final   Culture  Setup Time   Final    GRAM NEGATIVE RODS AEROBIC BOTTLE ONLY CRITICAL RESULT CALLED TO, READ BACK BY AND VERIFIED WITH: SANDRA VORBA ON 11/30/14 AT 0825 BY JEF    Culture KLEBSIELLA OXYTOCA AEROBIC BOTTLE ONLY   Final   Report Status 12/04/2014 FINAL  Final   Organism ID, Bacteria KLEBSIELLA OXYTOCA  Final      Susceptibility   Klebsiella oxytoca - MIC*    AMPICILLIN >=32 RESISTANT Resistant     CEFTAZIDIME <=1 SENSITIVE Sensitive     CEFAZOLIN >=64 RESISTANT Resistant     CEFTRIAXONE <=1 SENSITIVE Sensitive     CIPROFLOXACIN <=0.25 SENSITIVE Sensitive     GENTAMICIN <=1 SENSITIVE Sensitive     IMIPENEM <=0.25 SENSITIVE Sensitive     TRIMETH/SULFA <=20 SENSITIVE Sensitive     CEFOXITIN <=4  SENSITIVE Sensitive     PIP/TAZO Value in next row Resistant      RESISTANT128    * KLEBSIELLA OXYTOCA  Urine culture     Status: None   Collection Time: 11/29/14 11:22 AM  Result Value Ref Range Status   Specimen Description URINE, CLEAN CATCH  Final   Special Requests NONE  Final   Culture >=100,000 COLONIES/mL KLEBSIELLA OXYTOCA  Final   Report Status 12/01/2014 FINAL  Final   Organism ID, Bacteria KLEBSIELLA OXYTOCA  Final      Susceptibility   Klebsiella oxytoca - MIC*    AMPICILLIN >=32 RESISTANT Resistant     CEFAZOLIN >=64 RESISTANT Resistant     CEFTRIAXONE 4 SENSITIVE Sensitive     CIPROFLOXACIN <=0.25 SENSITIVE Sensitive     GENTAMICIN <=1 SENSITIVE Sensitive     IMIPENEM <=0.25 SENSITIVE Sensitive     NITROFURANTOIN <=16 SENSITIVE Sensitive     TRIMETH/SULFA <=20 SENSITIVE Sensitive     CEFOXITIN <=4 SENSITIVE Sensitive     * >=100,000 COLONIES/mL KLEBSIELLA OXYTOCA  MRSA PCR Screening     Status: None   Collection Time: 11/29/14 12:13 PM  Result Value Ref Range Status   MRSA by PCR NEGATIVE NEGATIVE Final    Comment:        The GeneXpert MRSA Assay (FDA approved for NASAL specimens only), is one component of a  comprehensive MRSA colonization surveillance program. It is not intended to diagnose MRSA infection nor to guide or monitor treatment for MRSA infections.   Culture, blood (routine x 2)     Status: None   Collection Time: 11/29/14  1:19 PM  Result Value Ref Range Status   Specimen Description BLOOD  Final   Special Requests Immunocompromised  Final   Culture NO GROWTH 5 DAYS  Final   Report Status 12/04/2014 FINAL  Final  Urine culture     Status: None   Collection Time: 11/30/14 10:12 AM  Result Value Ref Range Status   Specimen Description URINE, CATHETERIZED  Final   Special Requests Immunocompromised  Final   Culture NO GROWTH 2 DAYS  Final   Report Status 12/02/2014 FINAL  Final  Culture, blood (routine x 2)     Status: None   Collection  Time: 12/06/14  8:08 PM  Result Value Ref Range Status   Specimen Description BLOOD  Final   Special Requests NONE  Final   Culture  Setup Time   Final    GRAM POSITIVE COCCI IN BOTH AEROBIC AND ANAEROBIC BOTTLES CRITICAL RESULT CALLED TO, READ BACK BY AND VERIFIED WITH: JENNIFER BEZARD AT 8616 12/08/14.PMH CONFIRMED BY RWW    Culture   Final    STAPHYLOCOCCUS AURICULARIS IN BOTH AEROBIC AND ANAEROBIC BOTTLES    Report Status 12/11/2014 FINAL  Final   Organism ID, Bacteria STAPHYLOCOCCUS AURICULARIS  Final      Susceptibility   Staphylococcus auricularis - MIC*    CIPROFLOXACIN >=8 RESISTANT Resistant     ERYTHROMYCIN >=8 RESISTANT Resistant     GENTAMICIN <=0.5 SENSITIVE Sensitive     OXACILLIN >=4 RESISTANT Resistant     TETRACYCLINE <=1 SENSITIVE Sensitive     VANCOMYCIN 1 SENSITIVE Sensitive     CLINDAMYCIN <=0.25 SENSITIVE Sensitive     TRIMETH/SULFA Value in next row Sensitive      SENSITIVE<=20    LEVOFLOXACIN Value in next row Intermediate      INTERMEDIATE4    * STAPHYLOCOCCUS AURICULARIS  Culture, blood (routine x 2)     Status: None   Collection Time: 12/06/14  8:40 PM  Result Value Ref Range Status   Specimen Description BLOOD  Final   Special Requests NONE  Final   Culture NO GROWTH 5 DAYS  Final   Report Status 12/11/2014 FINAL  Final  Culture, blood (routine x 2)     Status: None   Collection Time: 12/08/14 11:40 AM  Result Value Ref Range Status   Specimen Description BLOOD  Final   Special Requests Normal  Final   Culture NO GROWTH 6 DAYS  Final   Report Status 12/14/2014 FINAL  Final  Culture, blood (routine x 2)     Status: None   Collection Time: 12/08/14 11:49 AM  Result Value Ref Range Status   Specimen Description BLOOD  Final   Special Requests Normal  Final   Culture NO GROWTH 6 DAYS  Final   Report Status 12/14/2014 FINAL  Final  C difficile quick scan w PCR reflex ([facility] only)     Status: None   Collection Time: 12/14/14 10:11 AM  Result  Value Ref Range Status   C Diff antigen NEGATIVE  Final   C Diff toxin NEGATIVE  Final   C Diff interpretation Negative for C. difficile  Final    Anti-infectives    Start     Dose/Rate Route Frequency Ordered Stop   12/19/14  1800  vancomycin (VANCOCIN) IVPB 750 mg/150 ml premix     750 mg 150 mL/hr over 60 Minutes Intravenous  Once 12/19/14 1515 12/19/14 1940   12/15/14 0630  vancomycin (VANCOCIN) IVPB 750 mg/150 ml premix     750 mg 150 mL/hr over 60 Minutes Intravenous  Once 12/15/14 0616 12/15/14 0726   12/14/14 1300  vancomycin (VANCOCIN) IVPB 750 mg/150 ml premix  Status:  Discontinued     750 mg 150 mL/hr over 60 Minutes Intravenous Every 48 hours 12/13/14 1107 12/14/14 0604   12/14/14 0603  vancomycin (VANCOCIN) IVPB 750 mg/150 ml premix     750 mg 150 mL/hr over 60 Minutes Intravenous Daily PRN 12/14/14 0604     12/09/14 0900  erythromycin 250 mg in sodium chloride 0.9 % 100 mL IVPB  Status:  Discontinued     250 mg 100 mL/hr over 60 Minutes Intravenous 3 times per day 12/09/14 0859 12/13/14 1054   12/08/14 1100  metroNIDAZOLE (FLAGYL) IVPB 500 mg  Status:  Discontinued     500 mg 100 mL/hr over 60 Minutes Intravenous Every 8 hours 12/08/14 0958 12/11/14 1048   12/08/14 0900  vancomycin (VANCOCIN) IVPB 750 mg/150 ml premix  Status:  Discontinued     750 mg 150 mL/hr over 60 Minutes Intravenous Every 24 hours 12/07/14 1102 12/12/14 0955   12/07/14 1030  cefTAZidime (FORTAZ) 2 g in dextrose 5 % 50 mL IVPB  Status:  Discontinued     2 g 100 mL/hr over 30 Minutes Intravenous Every 24 hours 12/07/14 1021 12/11/14 1048   12/07/14 0700  vancomycin (VANCOCIN) IVPB 1000 mg/200 mL premix  Status:  Discontinued     1,000 mg 200 mL/hr over 60 Minutes Intravenous Every 24 hours 12/06/14 2225 12/07/14 1102   12/06/14 2200  piperacillin-tazobactam (ZOSYN) IVPB 4.5 g  Status:  Discontinued     4.5 g 200 mL/hr over 30 Minutes Intravenous 3 times per day 12/06/14 1946 12/07/14 1011    12/06/14 2000  vancomycin (VANCOCIN) IVPB 1000 mg/200 mL premix     1,000 mg 200 mL/hr over 60 Minutes Intravenous STAT 12/06/14 1947 12/06/14 2113   12/06/14 2000  cefTRIAXone (ROCEPHIN) 1 g in dextrose 5 % 50 mL IVPB - Premix  Status:  Discontinued     1 g 100 mL/hr over 30 Minutes Intravenous Every 24 hours 12/06/14 1959 12/07/14 1011   12/06/14 1945  piperacillin-tazobactam (ZOSYN) IVPB 3.375 g  Status:  Discontinued     3.375 g 100 mL/hr over 30 Minutes Intravenous 4 times per day 12/06/14 1940 12/06/14 2007   12/06/14 1945  vancomycin (VANCOCIN) IVPB 1000 mg/200 mL premix  Status:  Discontinued     1,000 mg 200 mL/hr over 60 Minutes Intravenous Every 12 hours 12/06/14 1940 12/06/14 2008   12/01/14 0945  piperacillin-tazobactam (ZOSYN) IVPB 3.375 g  Status:  Discontinued     3.375 g 12.5 mL/hr over 240 Minutes Intravenous 3 times per day 12/01/14 0936 12/03/14 1337   11/30/14 1000  cefTRIAXone (ROCEPHIN) 2 g in dextrose 5 % 50 mL IVPB - Premix  Status:  Discontinued    Comments:  Entered per MD progress note   2 g 100 mL/hr over 30 Minutes Intravenous Every 24 hours 11/29/14 2157 12/06/14 1940   11/29/14 1415  cefTRIAXone (ROCEPHIN) 1 g in dextrose 5 % 50 mL IVPB - Premix     1 g 100 mL/hr over 30 Minutes Intravenous  Once 11/29/14 1406 11/29/14 1452   11/29/14  1414  cefTRIAXone (ROCEPHIN) 20 MG/ML IVPB 50 mL    Comments:  MONAR, NELLIE: cabinet override      11/29/14 1414 11/29/14 1424      Assessment: Patient received Vancomycin 750mg  IV on 12/19/14 post HD. Vancomycin to be continued through 12/21/14. Patient is not getting HD today.  Goal of Therapy:  Resolution of symptoms  Plan:  Will not give any Vancomycin today since patient is not going to receive HD. Tomorrow will be last day of therapy.  [name] Fusi, PharmD, BCPS 12/20/2014 2:08 PM

## 2014-12-20 NOTE — Progress Notes (Signed)
[place] at [facility]                                                                                                                                                                                            Patient Demographics   Caleb Francis, is a 69 y.o. male, DOB - 1945-09-04, TGY:[id number]  Admit date - 11/29/2014   Admitting Physician Aldean Jewett, MD  Outpatient Primary MD for the patient is No PCP Per Patient   Subjective:   More awake and alert today. Denies any complaint. Denies any abdominal discomfort. Status post hemodialysis yesterday NGT in place, with minimal suction  Review of Systems:   Review of Systems  Constitutional: Negative for fever, chills and malaise/fatigue.  HENT: Negative for ear pain, hearing loss, nosebleeds and tinnitus.   Eyes: Negative for blurred vision, photophobia and pain.  Respiratory: Negative for cough, hemoptysis and shortness of breath.   Cardiovascular: Negative for chest pain, palpitations and orthopnea.  Gastrointestinal: Negative for heartburn, abdominal pain and diarrhea.  Genitourinary: Negative for dysuria and flank pain.  Musculoskeletal: Negative for myalgias and back pain.  Skin: Negative for itching and rash.  Neurological: Negative for tremors, sensory change, seizures, weakness and headaches.  Endo/Heme/Allergies: Negative for environmental allergies. Does not bruise/bleed easily.  Psychiatric/Behavioral: The patient is not nervous/anxious and does not have insomnia.      Vitals:   Filed Vitals:   12/19/14 2035 12/19/14 2338 12/20/14 0411 12/20/14 1125  BP: 98/65 99/68 96/56  93/51  Pulse: 110 113 109 102  Temp: 98.3 F (36.8 C)  98.2 F (36.8 C) 97.9 F (36.6 C)  TempSrc: Oral  Oral   Resp: 20 22 20 18   Height:      Weight:   58.196 kg (128 lb 4.8 oz)   SpO2: 100% 100% 100%     Wt Readings from Last 3 Encounters:  12/20/14 58.196 kg (128 lb 4.8 oz)  11/27/14 68.2  kg (150 lb 5.7 oz)  11/25/14 70.761 kg (156 lb)     Intake/Output Summary (Last 24 hours) at 12/20/14 1528 Last data filed at 12/20/14 1108  Gross per 24 hour  Intake 2725.18 ml  Output   1653 ml  Net 1072.18 ml    Physical Exam:   GENERAL: critically ill-appearing PERRLA Atraumatic  NECK: Supple. There is no jugular venous distention. No bruits, no lymphadenopathy, no thyromegaly.  HEART:  Tachycardic 2/6 systolic ejection murmur no rubs LUNGS: Coarse breath sounds. ABDOMEN: hypoand active bowel sounds without rebound or guarding  EXTREMITIES: No evidence of any cyanosis, clubbing,   NEUROLOGIC: Lethargic  SKIN: Moist and warm with no rashes appreciated.  Psych:    no agitation reported overnight.  Radiology Reports 12/10/2014: CT abdomen: Large distended colonic ileus. No obstruction  12/12/2014: KUB   CBC  Recent Labs Lab 12/15/14 0529 12/16/14 0524  12/17/14 2015 12/18/14 0637 12/18/14 1353 12/19/14 0800 12/20/14 0513  WBC 9.4 9.4  < > 8.3 8.0 8.9 10.3 11.4*  HGB 7.5* 7.1*  < > 7.0* 6.5* 6.8* 8.8* 8.3*  HCT 22.6* 22.1*  < > 21.7* 19.9* 20.9* 26.5* 25.2*  PLT 43* 59*  < > 86* 76* 84* 77* 104*  MCV 84.7 85.9  < > 87.1 84.7 85.9 86.6 86.2  MCH 27.9 27.6  < > 28.0 27.9 27.9 28.7 28.5  MCHC 33.0 32.1  < > 32.1 33.0 32.5 33.2 33.1  RDW 20.3* 20.2*  < > 19.3* 19.0* 19.0* 18.4* 18.6*  LYMPHSABS 0.8 1.3  --   --  0.7  --  1.2 1.9  MONOABS 0.2 0.5  --   --  0.8  --  0.7 1.8*  EOSABS 0.0 0.0  --   --  0.0  --  0.2 0.0  BASOSABS 0.0 0.0  --   --  0.0  --  0.0 0.0  < > = values in this interval not displayed.  Chemistries   Recent Labs Lab 12/14/14 0502  12/16/14 0524  12/17/14 0453 12/17/14 2015 12/18/14 0637 12/18/14 1353 12/19/14 0800 12/20/14 0513  NA 143  < > 144  --  143 144 141 138 139 138  K 3.1*  < > 3.0*  < > 3.8 3.8 3.8 4.2 3.8 3.3*  CL 112*  < > 116*  --  123* 120* 112* 109 105 101  CO2 24  < > 19*  --  16* 14* 22 20* 25 26  GLUCOSE 158*  <  > 145*  --  157* 137* 169* 133* 369* 141*  BUN 78*  < > 89*  --  103* 116* 86* 90* 69* 45*  CREATININE 4.91*  < > 6.00*  --  6.31* 6.39* 4.89* 5.11* 4.19* 3.10*  CALCIUM 8.0*  < > 8.5*  --  8.5* 8.8* 8.3* 8.1* 8.7* 8.5*  MG 2.3  --  1.8  --  1.9  --  1.7  --  1.8  --   AST 12*  --   --   --   --   --   --   --   --   --   ALT 9*  --   --   --   --  15*  --   --   --   --   ALKPHOS 114  --   --   --   --   --   --   --   --   --   BILITOT 0.6  --   --   --   --   --   --   --   --   --   < > = values in this interval not displayed. ------------------------------------------------------------------------------------------------------------------- No results for input(s): TSH, T4TOTAL, T3FREE, THYROIDAB in the last 72 hours.  Invalid input(s): FREET3 ------------------------------------------------------------------------------------------------------------------  Coagulation profile No results for input(s): INR, PROTIME in the last 168 hours.   Assessment & Plan  This is a 69 year old male with mantle cell lymphoma undergoing chemotherapy who was admitted June 15 from oncology clinic with altered mental status and chest pain.   # Acute renal failure due  to ATN : Clinically improving Patient's creatinine is improving, had a dialysis 3 times so far Had hemodialysis yesterday. Scheduled for hemodialysis tomorrow as well Follow-up with nephrology.  # Sepsis: With Klebsiella UTI and bacteremia and Staph bacteremia Patient's urine culture and blood cultures were positive for Klebsiella on admission. [place]. Repeat blood cx on 12/06/2014 are growing  STAPHYLOCOCCUS AURICULARIS . Vancomycin till 12/21/2014.  # Acute encephalopathy - Metabolic encephalopathy due to sepsis and urinary tract infection/ARF/Hypernatremia along with ICU delirium. - CT head/MRI brain showing no evidence of acute pathology.  Clinically improving  # Chest pain on admission: No EKG changes to suggest  ischemia.  Continue to monitor for any further cardiac symptoms  # Mantle cell lymphoma: Patient is on salvage chemotherapy. Patient follows with Dr. Mike Gip.   He is s/p chemo He was on GCSF and no longer neutropenic.    # Panctytopenia: Due to chemotherapy and Mantle cell lymphoma, patient is currently on Neupogen. Patient is s/p 2 units PRBC on 6/25 and 1 unit 12/14/2014 and has received PLT transfusion now stopped Patient needs premedication prior to any transfusions including Benadryl and Tylenol.   # Thrombocytopenia This is due to sepsis/lymphoma and chemo Improving, platelets - 10 4000   # colonic Ileus with GI bleed: CT scan shows colonic ileus that has worsened. Patient is no longer neutropenic. Rectal tube in place. Started daily miralax. .Pt is not a surgical candidate at this time. Continue supportive measures. GI signed off as patient is clinically improving    # Hypernatremia - resolved  # GI bleed: Patient currently has an NG tube placed. PPI. High risk for re bleeding due to thrombocytopenia.  # Acute blood loss anmeia over pancytopenia Due to GI losses and lymphoma.  Overall Prognosis is very poor  May be a good candidate for LTAC , follow-up with case management   DNR  DVT Prophylaxis   SCD's    Lab Results  Component Value Date   PLT 104* 12/20/2014     Poor prognosis  Total time spent 35 minutes  Greater than 50% of time spent in care and coordination  Nicholes Mango M.D on 12/20/2014 at 3:28 PM  Between 7am to 6pm - Pager - [phone]  After 6pm go to [email or web address] - password [email or web address] [facility]   Office  [phone]

## 2014-12-20 NOTE — Care Management (Signed)
Important Message  Patient Details  Name: Caleb Francis MRN: [id number] Date of Birth: 1945/09/09   Medicare Important Message Given:  Yes-fourth notification given    [name] 12/20/2014, 9:51 AM

## 2014-12-20 NOTE — Progress Notes (Signed)
PARENTERAL NUTRITION CONSULT NOTE - FOLLOW UP  Pharmacy Consult for Electrolyte Mgmt   No Known Allergies  Patient Measurements: Height: 6' (182.9 cm) Weight: 128 lb 4.8 oz (58.196 kg) IBW/kg (Calculated) : 77.6  Vital Signs: Temp: 98.2 F (36.8 C) (07/08 0411) Temp Source: Oral (07/08 0411) BP: 96/56 mmHg (07/08 0411) Pulse Rate: 109 (07/08 0411) Intake/Output from previous day: 07/07 0701 - 07/08 0700 In: 2725.2 [BJY:7829.5] Out: 1978 [Urine:1000; Stool:1000] Intake/Output from this shift: Total I/O In: 1376.7 [TPN:1376.7] Out: 1350 [Urine:700; Stool:650]  Labs:  Recent Labs  12/18/14 0637 12/18/14 1353 12/19/14 0800  WBC 8.0 8.9 10.3  HGB 6.5* 6.8* 8.8*  HCT 19.9* 20.9* 26.5*  PLT 76* 84* 77*     Recent Labs  12/17/14 2015 12/18/14 0637 12/18/14 1353 12/19/14 0800 12/20/14 0513  NA 144 141 138 139 138  K 3.8 3.8 4.2 3.8 3.3*  CL 120* 112* 109 105 101  CO2 14* 22 20* 25 26  GLUCOSE 137* 169* 133* 369* 141*  BUN 116* 86* 90* 69* 45*  CREATININE 6.39* 4.89* 5.11* 4.19* 3.10*  CALCIUM 8.8* 8.3* 8.1* 8.7* 8.5*  MG  --  1.7  --  1.8  --   PHOS  --   --  2.3*  2.2*  --   --   ALBUMIN  --   --  2.2*  --   --   ALT 15*  --   --   --   --    Estimated Creatinine Clearance: 19 mL/min (by C-G formula based on Cr of 3.1).    Recent Labs  12/19/14 2057 12/19/14 2339 12/20/14 0351  GLUCAP 172* 129* 163*    Medications:  Scheduled:  . antiseptic oral rinse  7 mL Mouth Rinse BID  . insulin aspart  0-9 Units Subcutaneous Q4H  . methylPREDNISolone (SOLU-MEDROL) injection  60 mg Intravenous Once  . mometasone-formoterol  2 puff Inhalation BID  . pantoprazole (PROTONIX) IV  40 mg Intravenous Q12H  . potassium chloride  20 mEq Oral BID  . potassium chloride  20 mEq Oral BID  . scopolamine  1 patch Transdermal Q72H  . thiamine IV  100 mg Intravenous Daily  . ziprasidone  10 mg Intramuscular QHS   Infusions:  . Marland KitchenTPN (CLINIMIX-E) Adult 70 mL/hr at  12/20/14 0120   PRN: sodium chloride, sodium chloride, sodium chloride, acetaminophen **OR** acetaminophen, albuterol, haloperidol lactate, heparin, heparin lock flush, lidocaine (PF), lidocaine-prilocaine, magnesium hydroxide, metoprolol, morphine injection, ondansetron (ZOFRAN) IV, promethazine, sodium chloride, sodium chloride, sodium chloride, vancomycin, ziprasidone  Assessment: Pharmacy consulted to assist in managing electrolytes in this patient on TPN with low potassium. Patient currently receiving potassium 20 meq packets bid.   Plan:  Will order an additional potassium chloride 20 meq packets bid x 2 today and f/u AM labs.   Caleb Francis 12/20/2014,6:17 AM

## 2014-12-20 NOTE — Consult Note (Signed)
Palliative Medicine Inpatient Consult Note   Name: Caleb Francis Date: 12/20/2014 MRN: [id number]  DOB: 31-May-1946  Referring Physician: Nicholes Mango, MD  Palliative Care consult requested for this 69 y.o. male for goals of medical therapy in patient with relapsing mantle cell lymphoma, ARF on HD, sepsis, unremitting colonic ileus,  And a cardiomyopathy.  He had been seen by Palliative Care last month and was actually lined up to go to [facility], when it became apparent that there was another option for salvage chemotherapy for him and he and family elected to pursue that.  He did well for a couple of weeks or so, but then developed symptoms of sepsis and is now hospitalized with sepsis having been treated, but with the complications of the sepsis requiring ongoing very aggressive care in the form of dialysis and parenteral TPN nutrition.  He is DNR status. Son is bedside and I spoke with him letting him know we are available to again discuss palliative matters and approach, should it become time to do so again in the near or not so near future.     REVIEW OF SYSTEMS:  Patient is not able to provide ROS due to poor cognition   SPIRITUAL SUPPORT SYSTEM: Yes  Family. .  SOCIAL HISTORY:  reports that he quit smoking about 2 months ago. His smoking use included Cigarettes. He has a 30 pack-year smoking history. He does not have any smokeless tobacco history on file. He reports that he does not drink alcohol or use illicit drugs.  LEGAL DOCUMENTS:  None known about or located in the record  CODE STATUS: DNR  PAST MEDICAL HISTORY: Past Medical History  Diagnosis Date  . Arthritis   . Hypertension   . RA (rheumatoid arthritis)   . Anemia   . Mantle cell lymphoma     Dr Mike Gip  . History of nuclear stress test     a. 12/2013: low risk, no sig ischemia, no EKG changes, no artifact, EF 63%  . Chronic kidney disease   . Sepsis     Dr Ola Spurr    PAST SURGICAL HISTORY:  Past  Surgical History  Procedure Laterality Date  . Back surgery    . Fracture surgery      ankle  . Portacath placement    . Cystoscopy w/ ureteral stent placement Left 10/24/2014    Procedure: CYSTOSCOPY WITH RETROGRADE PYELOGRAM/URETERAL STENT PLACEMENT;  Surgeon: Irine Seal, MD;  Location: [facility] ORS;  Service: Urology;  Laterality: Left;    ALLERGIES:  has No Known Allergies.  MEDICATIONS:  Current Facility-Administered Medications  Medication Dose Route Frequency Provider Last Rate Last Dose  . Marland KitchenTPN (CLINIMIX-E) Adult   Intravenous Continuous TPN Lavonia Dana, MD 70 mL/hr at 12/20/14 0120    . 0.9 %  sodium chloride infusion  100 mL Intravenous PRN Munsoor Lateef, MD      . 0.9 %  sodium chloride infusion  100 mL Intravenous PRN Munsoor Lateef, MD      . 0.9 %  sodium chloride infusion  100 mL Intravenous PRN Munsoor Lateef, MD      . acetaminophen (TYLENOL) tablet 650 mg  650 mg Oral Q6H PRN Aldean Jewett, MD       Or  . acetaminophen (TYLENOL) suppository 650 mg  650 mg Rectal Q6H PRN Aldean Jewett, MD   650 mg at 12/02/14 1932  . albuterol (PROVENTIL) (2.5 MG/3ML) 0.083% nebulizer solution 2.5 mg  2.5 mg Nebulization Q4H PRN Shanon Brow  K Hower, MD   2.5 mg at 12/14/14 0155  . antiseptic oral rinse (CPC / CETYLPYRIDINIUM CHLORIDE 0.05%) solution 7 mL  7 mL Mouth Rinse BID Aldean Jewett, MD   7 mL at 12/19/14 2200  . haloperidol lactate (HALDOL) injection 2.5 mg  2.5 mg Intravenous Q4H PRN Henreitta Leber, MD   2.5 mg at 12/10/14 1632  . heparin injection 1,000 Units  1,000 Units Intravenous PRN Munsoor Lateef, MD      . heparin lock flush 100 unit/mL  250 Units Intracatheter PRN Lequita Asal, MD      . insulin aspart (novoLOG) injection 0-9 Units  0-9 Units Subcutaneous Q4H Nicholes Mango, MD   1 Units at 12/19/14 2350  . lidocaine (PF) (XYLOCAINE) 1 % injection 5 mL  5 mL Intradermal PRN Munsoor Lateef, MD      . lidocaine-prilocaine (EMLA) cream 1 application  1 application  Topical PRN Munsoor Lateef, MD      . magnesium hydroxide (MILK OF MAGNESIA) suspension 30 mL  30 mL Oral Daily PRN Aldean Jewett, MD      . methylPREDNISolone sodium succinate (SOLU-MEDROL) 125 mg/2 mL injection 60 mg  60 mg Intravenous Once Dustin Flock, MD      . metoprolol (LOPRESSOR) injection 5 mg  5 mg Intravenous Q4H PRN Dustin Flock, MD   5 mg at 12/13/14 1818  . mometasone-formoterol (DULERA) 100-5 MCG/ACT inhaler 2 puff  2 puff Inhalation BID Bettey Costa, MD   2 puff at 12/19/14 2054  . morphine 2 MG/ML injection 1-2 mg  1-2 mg Intravenous Q2H PRN [place] Jack Phifer, MD   2 mg at 12/14/14 1007  . ondansetron (ZOFRAN) injection 4 mg  4 mg Intravenous Q6H PRN Bettey Costa, MD   4 mg at 12/13/14 1505  . pantoprazole (PROTONIX) injection 40 mg  40 mg Intravenous Q12H Hillary Bow, MD   40 mg at 12/19/14 2126  . potassium chloride (KLOR-CON) packet 20 mEq  20 mEq Oral BID Hillary Bow, MD   20 mEq at 12/19/14 2136  . potassium chloride (KLOR-CON) packet 20 mEq  20 mEq Oral BID Nicholes Mango, MD      . promethazine (PHENERGAN) injection 25 mg  25 mg Intravenous Q6H PRN Hillary Bow, MD   25 mg at 12/13/14 1628  . scopolamine (TRANSDERM-SCOP) 1 MG/3DAYS 1.5 mg  1 patch Transdermal Q72H Dustin Flock, MD   1.5 mg at 12/18/14 2245  . sodium chloride 0.9 % injection 10 mL  10 mL Intracatheter PRN Lequita Asal, MD      . sodium chloride 0.9 % injection 10 mL  10 mL Intracatheter PRN Lequita Asal, MD      . sodium chloride 0.9 % injection 3 mL  3 mL Intracatheter PRN Lequita Asal, MD      . thiamine (B-1) injection 100 mg  100 mg Intravenous Daily Valora Corporal, MD   100 mg at 12/19/14 6967  . vancomycin (VANCOCIN) IVPB 750 mg/150 ml premix  750 mg Intravenous Daily PRN Srikar Sudini, MD      . ziprasidone (GEODON) injection 10 mg  10 mg Intramuscular Q12H PRN Flora Lipps, MD   10 mg at 12/05/14 1515  . ziprasidone (GEODON) injection 10 mg  10 mg Intramuscular QHS Valora Corporal,  MD   10 mg at 12/19/14 2208   Facility-Administered Medications Ordered in Other Encounters  Medication Dose Route Frequency Provider Last Rate Last Dose  . 0.9 %  sodium chloride infusion   Intravenous Continuous Lequita Asal, MD 20 mL/hr at 11/06/14 1010 20 mL/hr at 11/06/14 1010  . 0.9 %  sodium chloride infusion   Intravenous Continuous Lequita Asal, MD   Stopped at 11/28/14 1500  . morphine 2 MG/ML injection 2 mg  2 mg Intravenous Q2H PRN Lequita Asal, MD   2 mg at 11/05/14 1422  . sodium chloride 0.9 % injection 10 mL  10 mL Intracatheter PRN Lequita Asal, MD   10 mL at 11/06/14 1011    Vital Signs: BP 96/56 mmHg  Pulse 109  Temp(Src) 98.2 F (36.8 C) (Oral)  Resp 20  Ht 6' (1.829 m)  Wt 58.196 kg (128 lb 4.8 oz)  BMI 17.40 kg/m2  SpO2 100% Filed Weights   12/19/14 0457 12/19/14 1629 12/20/14 0411  Weight: 62.097 kg (136 lb 14.4 oz) 59.784 kg (131 lb 12.8 oz) 58.196 kg (128 lb 4.8 oz)    Estimated body mass index is 17.4 kg/(m^2) as calculated from the following:   Height as of this encounter: 6' (1.829 m).   Weight as of this encounter: 58.196 kg (128 lb 4.8 oz).  PERFORMANCE STATUS (ECOG) : 4 - Bedbound  PHYSICAL EXAM: NAD NG to wall suction EOMI Focuses and nods No JVD or thyromegaly Heart rrr no mgr Lungs cta with some ronchi Abd soft and NT Skin warm and dry Nonverbal during my visit.   Son is bedside.     IMPRESSION:   Mantle Cell Lymphoma --Relapsing  --Managed by Dr. Mike Gip with salvage chemo given last month  --Prognosis is very poor but is being considered for bone marrow transplant (though he may not any longer be a candidate) --GCSF DCd  --continues on Neupogen --Has been transfused for anemia --Transfused platelets but had reaction so needs pretreatment for transfusions in the future.  --Thrombocytopenia is improved (due to lymphoma, sepsis and chemo  Sepsis and UTI  (klebsiella UTI and Bacteremia dn Staph  Bacteremia --treated with help of ID with last Vanco planned to be through 7/9.    Acute renal failure due to ATN -now on hemodialysis  Longlasting clonic Ileus --had associated GI bleed initially  --got rectal tube and miralax --not a surgical candidate --improved at first with NG to suction --now not improving rapidly and NG feedings not initiated   Hypernatremia --resolvied  GI bleed --high risk due to low platelets --NG tube to suction --PPI  Probable ischemic cardiomyopathy --last echo showed EF 35% --Severe AR and Moderate MR     PLAN:  Continue DNR  Supportive Conversations are indicated over time.  Patient's son did not have any questions and he has been involved in palliative directed conversations off and on over the last couple of months.  So, we need to take their lead.  Pt was set up to go to [facility] on June 2nd, but on that date, he was actually given option for more chemo and that was chosen and transfer to [facility] did not take place.  He now may have reached a point where BM transplant (which is still being discussed) might not be recommended any longer, due to his ongoing recent illness and ileus, etc --with associated progression of disease.  Dr. Mike Gip is contacting the transplant center for update.  Should there be a decision to stop chemo and aggressive interventions for his mantle cell lymphoma, then he would be a candidate for Hospice again and Hospice could then be reconsulted.  For now, TPN is continuing which provides sustenence, but which makes it difficult for [place] to easily transfer him to a different level of care while things are being sorted out regarding how aggressive family / pt desire (since there are unkowns about transplant option, etc).       Social Work considering discussing option of LTAC  More than 50% of the visit was spent in counseling/coordination of care: Yes  Time Spent:  70 minutes

## 2014-12-20 NOTE — Progress Notes (Signed)
Initial Nutrition Assessment  DOCUMENTATION CODES:  Severe malnutrition in context of acute illness/injury  INTERVENTION:  PN: recommend continuing 5%AA/20% at goal rate of 70 ml/hr, 20% lipids infusions on Mondays and Thursdays. Recommend full TPN panel today as pt on TPN >1 week; discussed with Lattie Haw, PharmD. Continue to assess    NUTRITION DIAGNOSIS:  Inadequate oral intake related to inability to eat as evidenced by NPO status. Being addressed via TPN  GOAL:  Patient will meet greater than or equal to 90% of their needs   MONITOR:   (PN, Energy Intake, Anthropometrics, Electrolyte/Renal Profile, Glucose Profile, Digestive System)   ASSESSMENT:  Pt received HD yesterday, no fluid removed; no plan for HD today per Myra Gianotti. Noted elevated WBC, pt is afebrile  Diet Order: NPO  Current Nutrition: TPN 5%AA/20%Dextrose at rate of 70 ml/hr  Gastrointestinal Profile: NG and flexiseal with minimal output, abdomen remains distended/firm per Junious Dresser RN  Urine Volume: 1000 mL in 24 hours  Medications: solumedrol, novolog  Electrolyte/Renal Profile and Glucose Profile:   Recent Labs Lab 12/16/14 0524  12/17/14 0453  12/18/14 0637 12/18/14 1353 12/19/14 0800 12/20/14 0513  NA 144  --  143  < > 141 138 139 138  K 3.0*  < > 3.8  < > 3.8 4.2 3.8 3.3*  CL 116*  --  123*  < > 112* 109 105 101  CO2 19*  --  16*  < > 22 20* 25 26  BUN 89*  --  103*  < > 86* 90* 69* 45*  CREATININE 6.00*  --  6.31*  < > 4.89* 5.11* 4.19* 3.10*  CALCIUM 8.5*  --  8.5*  < > 8.3* 8.1* 8.7* 8.5*  MG 1.8  --  1.9  --  1.7  --  1.8  --   PHOS 4.5  --  3.7  --   --  2.3*  2.2*  --   --   GLUCOSE 145*  --  157*  < > 169* 133* 369* 141*  < > = values in this interval not displayed. Protein Profile:  Recent Labs Lab 12/14/14 0502 12/18/14 1353  ALBUMIN 2.1* 2.2*   Nutritional Anemia Profile:  CBC Latest Ref Rng 12/20/2014 12/19/2014 12/18/2014  WBC 3.8 - 10.6 K/uL 11.4(H) 10.3 8.9  Hemoglobin 13.0 -  18.0 g/dL 8.3(L) 8.8(L) 6.8(L)  Hematocrit 40.0 - 52.0 % 25.2(L) 26.5(L) 20.9(L)  Platelets 150 - 440 K/uL 104(L) 77(L) 84(L)   No new TG, no new liver function panel   Weight Trend since Admission: Filed Weights   12/19/14 0457 12/19/14 1629 12/20/14 0411  Weight: 136 lb 14.4 oz (62.097 kg) 131 lb 12.8 oz (59.784 kg) 128 lb 4.8 oz (58.196 kg)     Height:  Ht Readings from Last 1 Encounters:  11/29/14 6' (1.829 m)    Weight:  Wt Readings from Last 1 Encounters:  12/20/14 128 lb 4.8 oz (58.196 kg)      BMI:  Body mass index is 17.4 kg/(m^2).  Estimated Nutritional Needs:  Kcal:  4650-3546 kcals (BEE 1436, 1.2 AF, 1.2-1.4 IF) using current wt of 62.9 kg  Protein:  76-95 (1.2-1.5 g/kg)   Fluid:  1575-1890 mL (25-30 ml/kg)   Diet Order:  Diet NPO time specified .TPN (CLINIMIX-E) Adult .TPN (CLINIMIX-E) Adult  HIGH Care Level  Kerman Passey MS, RD, LDN [phone] Pager

## 2014-12-20 NOTE — Care Management Note (Signed)
Case Management Note  Patient Details  Name: FRANCE NOYCE MRN: [id number] Date of Birth: 1946/01/21  Subjective/Objective:  Spoke with Dr. Margaretmary Eddy regarding LTACH. She feels this would be the best plan at this point for patient. Dr. Margaretmary Eddy anticipates patient will be medically stable for transfer next Monday or Tues.  TC to [facility] requesting they evaluate patient.                   Action/Plan:   Expected Discharge Date:                  Expected Discharge Plan:  [place]  In-House Referral:  Clinical Social Work  Discharge planning Services     Post Acute Care Choice:    Choice offered to:     DME Arranged:    DME Agency:     HH Arranged:    [place] Agency:     Status of Service:     Medicare Important Message Given:  Yes-fourth notification given Date Medicare IM Given:  12/03/14 Medicare IM give by:  Orvan July Date Additional Medicare IM Given:  12/06/14 Additional Medicare Important Message give by:  Orvan July  If discussed at Long Length of Stay Meetings, dates discussed:    Additional Comments:  Jolly Mango, RN 12/20/2014, 11:33 AM

## 2014-12-20 NOTE — Care Management Note (Signed)
Case Management Note  Patient Details  Name: Caleb Francis MRN: [id number] Date of Birth: 03/19/1946  Subjective/Objective:  Spoke with karen with [facility]. [facility] requires clear goals for progression of care before [facility] will even consider LTACH.                  Action/Plan:   Expected Discharge Date:                  Expected Discharge Plan:  [place]  In-House Referral:  Clinical Social Work  Discharge planning Services     Post Acute Care Choice:    Choice offered to:     DME Arranged:    DME Agency:     HH Arranged:    [place] Agency:     Status of Service:     Medicare Important Message Given:  Yes-fourth notification given Date Medicare IM Given:  12/03/14 Medicare IM give by:  Orvan July Date Additional Medicare IM Given:  12/06/14 Additional Medicare Important Message give by:  Orvan July  If discussed at Long Length of Stay Meetings, dates discussed:    Additional Comments:  Jolly Mango, RN 12/20/2014, 2:25 PM

## 2014-12-21 LAB — PREALBUMIN: PREALBUMIN: 20 mg/dL (ref 18–38)

## 2014-12-21 LAB — GLUCOSE, CAPILLARY
GLUCOSE-CAPILLARY: 170 mg/dL — AB (ref 65–99)
GLUCOSE-CAPILLARY: 151 mg/dL — AB (ref 65–99)
GLUCOSE-CAPILLARY: 131 mg/dL — AB (ref 65–99)
Glucose-Capillary: 157 mg/dL — ABNORMAL HIGH (ref 65–99)
Glucose-Capillary: 147 mg/dL — ABNORMAL HIGH (ref 65–99)

## 2014-12-21 LAB — COMPREHENSIVE METABOLIC PANEL
ALBUMIN: 2.5 g/dL — AB (ref 3.5–5.0)
ALK PHOS: 216 U/L — AB (ref 38–126)
ALT: 23 U/L (ref 17–63)
ANION GAP: 9 (ref 5–15)
AST: 26 U/L (ref 15–41)
BILIRUBIN TOTAL: 0.8 mg/dL (ref 0.3–1.2)
BUN: 72 mg/dL — AB (ref 6–20)
CHLORIDE: 106 mmol/L (ref 101–111)
CO2: 24 mmol/L (ref 22–32)
Calcium: 8.5 mg/dL — ABNORMAL LOW (ref 8.9–10.3)
Creatinine, Ser: 4.5 mg/dL — ABNORMAL HIGH (ref 0.61–1.24)
GFR calc Af Amer: 14 mL/min — ABNORMAL LOW (ref >60)
GFR calc non Af Amer: 12 mL/min — ABNORMAL LOW (ref >60)
Glucose, Bld: 136 mg/dL — ABNORMAL HIGH (ref 65–99)
POTASSIUM: 3.6 mmol/L (ref 3.5–5.1)
SODIUM: 139 mmol/L (ref 135–145)
TOTAL PROTEIN: 6.3 g/dL — AB (ref 6.5–8.1)

## 2014-12-21 LAB — CBC WITH DIFFERENTIAL/PLATELET
Band Neutrophils: 2 % (ref 0–10)
Basophils Absolute: 0 10*3/uL (ref 0.0–0.1)
Basophils Relative: 0 % (ref 0–1)
Blasts: 0 %
Eosinophils Absolute: 0 10*3/uL (ref 0.0–0.7)
Eosinophils Relative: 0 % (ref 0–5)
HCT: 24.9 % — ABNORMAL LOW (ref 40.0–52.0)
Hemoglobin: 8.4 g/dL — ABNORMAL LOW (ref 13.0–18.0)
Lymphocytes Relative: 4 % — ABNORMAL LOW (ref 12–46)
Lymphs Abs: 0.4 10*3/uL — ABNORMAL LOW (ref 0.7–4.0)
MCH: 29.5 pg (ref 26.0–34.0)
MCHC: 33.9 g/dL (ref 32.0–36.0)
MCV: 87.1 fL (ref 80.0–100.0)
Metamyelocytes Relative: 0 %
Monocytes Absolute: 1.1 10*3/uL — ABNORMAL HIGH (ref 0.1–1.0)
Monocytes Relative: 11 % (ref 3–12)
Myelocytes: 0 %
Neutro Abs: 8.9 10*3/uL — ABNORMAL HIGH (ref 1.7–7.7)
Neutrophils Relative %: 83 % — ABNORMAL HIGH (ref 43–77)
Other: 0 %
Platelets: 146 10*3/uL — ABNORMAL LOW (ref 150–440)
Promyelocytes Absolute: 0 %
RBC: 2.86 MIL/uL — ABNORMAL LOW (ref 4.40–5.90)
RDW: 19.2 % — ABNORMAL HIGH (ref 11.5–14.5)
Smear Review: ADEQUATE
WBC: 10.4 10*3/uL (ref 3.8–10.6)
nRBC: 0 /100 WBC

## 2014-12-21 LAB — MAGNESIUM: MAGNESIUM: 1.9 mg/dL (ref 1.7–2.4)

## 2014-12-21 LAB — TRIGLYCERIDES: Triglycerides: 118 mg/dL (ref <150)

## 2014-12-21 LAB — PHOSPHORUS: Phosphorus: 4.7 mg/dL — ABNORMAL HIGH (ref 2.5–4.6)

## 2014-12-21 MED ORDER — TRACE MINERALS CR-CU-MN-SE-ZN 10-1000-500-60 MCG/ML IV SOLN
INTRAVENOUS | Status: AC
  Administered 2014-12-21: 18:00:00 via INTRAVENOUS
  Filled 2014-12-21: qty 1680

## 2014-12-21 NOTE — Progress Notes (Signed)
Nutrition Follow-up  DOCUMENTATION CODES:  Severe malnutrition in context of acute illness/injury  INTERVENTION:  PN: recommend continuing TPN regimen as ordered. Discussed with MD Candiss Norse.   NUTRITION DIAGNOSIS:  Inadequate oral intake related to inability to eat as evidenced by NPO status; being addressed with TPN  GOAL:  Patient will meet greater than or equal to 90% of their needs  MONITOR:   (PN, Energy Intake, Anthropometrics, Electrolyte/Renal Profile, Glucose Profile, Digestive System)   ASSESSMENT:  Pt having HD done today. NG tube and rectal tube in place.  Diet Order: NPO  Current Nutrition: 5%AA/20% Dextrose at 46m/hr with 20% lipids twice a week   Gastrointestinal Profile: Last BM: 4022moutput via rectal tube per Nsg documentation this am, 10074mut NG tube   Urine Volume: 1200m22m last 24 hours   Medications: solumedrol, novolog  Electrolyte/Renal Profile and Glucose Profile:   Recent Labs Lab 12/17/14 0453  12/18/14 0637 12/18/14 1353 12/19/14 0800 12/20/14 0513 12/21/14 0629  NA 143  < > 141 138 139 138 139  K 3.8  < > 3.8 4.2 3.8 3.3* 3.6  CL 123*  < > 112* 109 105 101 106  CO2 16*  < > 22 20* 25 26 24   BUN 103*  < > 86* 90* 69* 45* 72*  CREATININE 6.31*  < > 4.89* 5.11* 4.19* 3.10* 4.50*  CALCIUM 8.5*  < > 8.3* 8.1* 8.7* 8.5* 8.5*  MG 1.9  --  1.7  --  1.8  --  1.9  PHOS 3.7  --   --  2.3*  2.2*  --   --  4.7*  GLUCOSE 157*  < > 169* 133* 369* 141* 136*  < > = values in this interval not displayed. Protein Profile:  Recent Labs Lab 12/18/14 1353 12/21/14 0629  ALBUMIN 2.2* 2.5*   Lipid Panel     Component Value Date/Time   TRIG 118 12/21/2014 0629    Nutritional Anemia Profile:  CBC Latest Ref Rng 12/21/2014 12/20/2014 12/19/2014  WBC 3.8 - 10.6 K/uL 10.4 11.4(H) 10.3  Hemoglobin 13.0 - 18.0 g/dL 8.4(L) 8.3(L) 8.8(L)  Hematocrit 40.0 - 52.0 % 24.9(L) 25.2(L) 26.5(L)  Platelets 150 - 440 K/uL 146(L) 104(L) 77(L)   Hepatic  Profile: Hepatic Function Latest Ref Rng 12/21/2014 12/18/2014 12/17/2014  Total Protein 6.5 - 8.1 g/dL 6.3(L) - -  Albumin 3.5 - 5.0 g/dL 2.5(L) 2.2(L) -  AST 15 - 41 U/L 26 - -  ALT 17 - 63 U/L 23 - 15(L)  Alk Phosphatase 38 - 126 U/L 216(H) - -  Total Bilirubin 0.3 - 1.2 mg/dL 0.8 - -  Bilirubin, Direct 0.1 - 0.5 mg/dL - - -    Weight Trend since Admission: Filed Weights   12/19/14 1629 12/20/14 0411 12/21/14 0538  Weight: 131 lb 12.8 oz (59.784 kg) 128 lb 4.8 oz (58.196 kg) 129 lb 6.4 oz (58.695 kg)    BMI:  Body mass index is 17.55 kg/(m^2).  Estimated Nutritional Needs:  Kcal:  [id number] (BEE 1436, 1.2 AF, 1.2-1.4 IF) using current wt of 62.9 kg  Protein:  76-95 (1.2-1.5 g/kg)   Fluid:  1575-1890 mL (25-30 ml/kg)   Diet Order:  Diet NPO time specified .TPN (CLINIMIX-E) Adult .TPN (CLINIMIX-E) Adult  EDUCATION NEEDS:  No education needs identified at this time   HIGHVan Wert, LDN Pager [phone]

## 2014-12-21 NOTE — Progress Notes (Signed)
[place] at [facility]                                                                                                                                                                                            Patient Demographics   Caleb Francis, is a 69 y.o. male, DOB - [birth year], QVZ:[id number]  Admit date - 11/29/2014   Admitting Physician Aldean Jewett, MD  Outpatient Primary MD for the patient is No PCP Per Patient   More awake and alert today. Schedule for hemodialysis today Denies any abdominal discomfort. Status post hemodialysis yesterday NGT in place, with 300 mL suction  Review of Systems:   Review of Systems  Constitutional: Negative for fever, chills and malaise/fatigue.  HENT: Negative for ear pain, hearing loss, nosebleeds and tinnitus.   Eyes: Negative for blurred vision, photophobia and pain.  Respiratory: Negative for cough, hemoptysis and shortness of breath.   Cardiovascular: Negative for chest pain, palpitations and orthopnea.  Gastrointestinal: Negative for heartburn, abdominal pain and diarrhea.  Genitourinary: Negative for dysuria and flank pain.  Musculoskeletal: Negative for myalgias and back pain.  Skin: Negative for itching and rash.  Neurological: Negative for tremors, sensory change, seizures, weakness and headaches.  Endo/Heme/Allergies: Negative for environmental allergies. Does not bruise/bleed easily.  Psychiatric/Behavioral: The patient is not nervous/anxious and does not have insomnia.      Vitals:   Filed Vitals:   12/21/14 1339 12/21/14 1345 12/21/14 1358 12/21/14 1427  BP: 78/50 80/51 92/51  89/48  Pulse: 116 109 110 105  Temp:   98 F (36.7 C) 98 F (36.7 C)  TempSrc:      Resp: 18 21 20 18   Height:      Weight:    60.328 kg (133 lb)  SpO2:        Wt Readings from Last 3 Encounters:  12/21/14 60.328 kg (133 lb)  11/27/14 68.2 kg (150 lb 5.7 oz)  11/25/14 70.761 kg (156 lb)      Intake/Output Summary (Last 24 hours) at 12/21/14 1521 Last data filed at 12/21/14 1431  Gross per 24 hour  Intake    840 ml  Output   1969 ml  Net  -1129 ml    Physical Exam:   GENERAL: critically ill-appearing PERRLA Atraumatic  NECK: Supple. There is no jugular venous distention. No bruits, no lymphadenopathy, no thyromegaly.  HEART:  Tachycardic 2/6 systolic ejection murmur no rubs LUNGS: Coarse breath sounds. ABDOMEN: hypoand active bowel sounds without rebound or guarding  EXTREMITIES: No evidence of any cyanosis, clubbing,   NEUROLOGIC: Lethargic  SKIN: Moist and warm  with no rashes appreciated.  Psych:    no agitation reported overnight.  Radiology Reports 12/10/2014: CT abdomen: Large distended colonic ileus. No obstruction  12/12/2014: KUB   CBC  Recent Labs Lab 12/16/14 0524  12/18/14 0637 12/18/14 1353 12/19/14 0800 12/20/14 0513 12/21/14 0629  WBC 9.4  < > 8.0 8.9 10.3 11.4* 10.4  HGB 7.1*  < > 6.5* 6.8* 8.8* 8.3* 8.4*  HCT 22.1*  < > 19.9* 20.9* 26.5* 25.2* 24.9*  PLT 59*  < > 76* 84* 77* 104* 146*  MCV 85.9  < > 84.7 85.9 86.6 86.2 87.1  MCH 27.6  < > 27.9 27.9 28.7 28.5 29.5  MCHC 32.1  < > 33.0 32.5 33.2 33.1 33.9  RDW 20.2*  < > 19.0* 19.0* 18.4* 18.6* 19.2*  LYMPHSABS 1.3  --  0.7  --  1.2 1.9 0.4*  MONOABS 0.5  --  0.8  --  0.7 1.8* 1.1*  EOSABS 0.0  --  0.0  --  0.2 0.0 0.0  BASOSABS 0.0  --  0.0  --  0.0 0.0 0.0  < > = values in this interval not displayed.  Chemistries   Recent Labs Lab 12/16/14 0524  12/17/14 0453 12/17/14 2015 12/18/14 0637 12/18/14 1353 12/19/14 0800 12/20/14 0513 12/21/14 0629  NA 144  --  143 144 141 138 139 138 139  K 3.0*  < > 3.8 3.8 3.8 4.2 3.8 3.3* 3.6  CL 116*  --  123* 120* 112* 109 105 101 106  CO2 19*  --  16* 14* 22 20* 25 26 24   GLUCOSE 145*  --  157* 137* 169* 133* 369* 141* 136*  BUN 89*  --  103* 116* 86* 90* 69* 45* 72*  CREATININE 6.00*  --  6.31* 6.39* 4.89* 5.11* 4.19* 3.10*  4.50*  CALCIUM 8.5*  --  8.5* 8.8* 8.3* 8.1* 8.7* 8.5* 8.5*  MG 1.8  --  1.9  --  1.7  --  1.8  --  1.9  AST  --   --   --   --   --   --   --   --  26  ALT  --   --   --  15*  --   --   --   --  23  ALKPHOS  --   --   --   --   --   --   --   --  216*  BILITOT  --   --   --   --   --   --   --   --  0.8  < > = values in this interval not displayed. ------------------------------------------------------------------------------------------------------------------- No results for input(s): TSH, T4TOTAL, T3FREE, THYROIDAB in the last 72 hours.  Invalid input(s): FREET3 ------------------------------------------------------------------------------------------------------------------  Coagulation profile No results for input(s): INR, PROTIME in the last 168 hours.   Assessment & Plan  This is a 69 year old male with mantle cell lymphoma undergoing chemotherapy who was admitted June 15 from oncology clinic with altered mental status and chest pain.   # Acute renal failure due to ATN : Clinically improving Patient's creatinine is improving, had a dialysis today Had hemodialysis yesterday. Scheduled for hemodialysis on Monday Follow-up with nephrology.  # Sepsis: With Klebsiella UTI and bacteremia and Staph bacteremia Patient's urine culture and blood cultures were positive for Klebsiella on admission. [place]. Repeat blood cx on 12/06/2014 are growing  STAPHYLOCOCCUS AURICULARIS . Vancomycin till 12/21/2014.  # Acute  encephalopathy - Metabolic encephalopathy due to sepsis and urinary tract infection/ARF/Hypernatremia along with ICU delirium. - CT head/MRI brain showing no evidence of acute pathology.  Clinically improving  # Chest pain on admission: No EKG changes to suggest ischemia.  Continue to monitor for any further cardiac symptoms  # Mantle cell lymphoma: Patient is on salvage chemotherapy. Patient follows with Dr. Mike Gip.   He is s/p chemo He was on GCSF and no longer  neutropenic.    # Panctytopenia: Due to chemotherapy and Mantle cell lymphoma, patient is currently on Neupogen. Patient is s/p 2 units PRBC on 6/25 and 1 unit 12/14/2014 and has received PLT transfusion now stopped Patient needs premedication prior to any transfusions including Benadryl and Tylenol.   # Thrombocytopenia This is due to sepsis/lymphoma and chemo Improving, platelets - 10 4000   # colonic Ileus with GI bleed: CT scan shows colonic ileus that has worsened. Patient is no longer neutropenic. Rectal tube in place. Started daily miralax. .Pt is not a surgical candidate at this time. Continue supportive measures. GI signed off as patient is clinically improving  # Hypernatremia - resolved  # GI bleed: Patient currently has an NG tube placed. PPI. High risk for re bleeding due to thrombocytopenia.  # Acute blood loss anmeia over pancytopenia Due to GI losses and lymphoma.  Overall Prognosis is very poor  May be a good candidate for LTAC , follow-up with case management   DNR  DVT Prophylaxis   SCD's    Lab Results  Component Value Date   PLT 146* 12/21/2014     Poor prognosis  Total time spent 30 minutes  Greater than 50% of time spent in care and coordination  Nicholes Mango M.D on 12/21/2014 at 3:21 PM  Between 7am to 6pm - Pager - [phone]  After 6pm go to [email or web address] - password [email or web address] [place] [facility]   Office  [phone]

## 2014-12-21 NOTE — Progress Notes (Signed)
Pt went to HD today and nothing was removed. FS are stable. TPN @70 . Pt complained of HA and received tylenol. NPO. NG tube to intermittment suction. Temp HD cath to R groin. Pt had a 8 beat run of v tach. Family at the bedside. Pt has no further concerns at this time.

## 2014-12-21 NOTE — Progress Notes (Signed)
Subjective:  Pt seems a bit more awake today. Only oriented to self Patient seen during dialysis Tolerating well    Objective:  Vital signs in last 24 hours:  Temp:  [97.5 F (36.4 C)-98.3 F (36.8 C)] 97.5 F (36.4 C) (07/09 1045) Pulse Rate:  [106-111] 108 (07/09 1200) Resp:  [18-25] 20 (07/09 1200) BP: (93-110)/(55-64) 99/62 mmHg (07/09 1200) SpO2:  [100 %] 100 % (07/09 1045) Weight:  [58.695 kg (129 lb 6.4 oz)] 58.695 kg (129 lb 6.4 oz) (07/09 0538)  Weight change: -1.089 kg (-2 lb 6.4 oz) Filed Weights   12/19/14 1629 12/20/14 0411 12/21/14 0538  Weight: 59.784 kg (131 lb 12.8 oz) 58.196 kg (128 lb 4.8 oz) 58.695 kg (129 lb 6.4 oz)    Intake/Output: I/O last 3 completed shifts: In: 2216.7  Out: 2950 [Urine:1900; Emesis/NG output:250; Stool:800]   Intake/Output this shift:     Physical Exam: General: awake, ill appearing  Head: NG in place, oral mucosa dry  Eyes: Anicteric  Neck: Supple, trachea midline  Lungs:  Clear to auscultation normal effort  Heart: S1S2 no rubs  Abdomen:  Soft, NTND, BS present  Extremities: Trace LE edema  Neurologic: Awake, following simple commands  Skin: No acute rashes  Access: Right femoral temp HD catheter    Basic Metabolic Panel:  Recent Labs Lab 12/16/14 0524  12/17/14 0453  12/18/14 0637 12/18/14 1353 12/19/14 0800 12/20/14 0513 12/21/14 0629  NA 144  --  143  < > 141 138 139 138 139  K 3.0*  < > 3.8  < > 3.8 4.2 3.8 3.3* 3.6  CL 116*  --  123*  < > 112* 109 105 101 106  CO2 19*  --  16*  < > 22 20* 25 26 24   GLUCOSE 145*  --  157*  < > 169* 133* 369* 141* 136*  BUN 89*  --  103*  < > 86* 90* 69* 45* 72*  CREATININE 6.00*  --  6.31*  < > 4.89* 5.11* 4.19* 3.10* 4.50*  CALCIUM 8.5*  --  8.5*  < > 8.3* 8.1* 8.7* 8.5* 8.5*  MG 1.8  --  1.9  --  1.7  --  1.8  --  1.9  PHOS 4.5  --  3.7  --   --  2.3*  2.2*  --   --  4.7*  < > = values in this interval not displayed.  Liver Function Tests:  Recent Labs Lab  12/17/14 2015 12/18/14 1353 12/21/14 0629  AST  --   --  26  ALT 15*  --  23  ALKPHOS  --   --  216*  BILITOT  --   --  0.8  PROT  --   --  6.3*  ALBUMIN  --  2.2* 2.5*   No results for input(s): LIPASE, AMYLASE in the last 168 hours. No results for input(s): AMMONIA in the last 168 hours.  CBC:  Recent Labs Lab 12/16/14 0524  12/18/14 0637 12/18/14 1353 12/19/14 0800 12/20/14 0513 12/21/14 0629  WBC 9.4  < > 8.0 8.9 10.3 11.4* 10.4  NEUTROABS 7.6  --  6.5  --  8.2* 7.7 8.9*  HGB 7.1*  < > 6.5* 6.8* 8.8* 8.3* 8.4*  HCT 22.1*  < > 19.9* 20.9* 26.5* 25.2* 24.9*  MCV 85.9  < > 84.7 85.9 86.6 86.2 87.1  PLT 59*  < > 76* 84* 77* 104* 146*  < > = values in this  interval not displayed.  Cardiac Enzymes: No results for input(s): CKTOTAL, CKMB, CKMBINDEX, TROPONINI in the last 168 hours.  BNP: Invalid input(s): POCBNP  CBG:  Recent Labs Lab 12/20/14 1127 12/20/14 1644 12/20/14 2037 12/21/14 0159 12/21/14 0716  GLUCAP 118* 154* 136* 170* 157*    Microbiology: Results for orders placed or performed during the hospital encounter of 11/29/14  MRSA PCR Screening     Status: None   Collection Time: 11/29/14 12:13 PM  Result Value Ref Range Status   MRSA by PCR NEGATIVE NEGATIVE Final    Comment:        The GeneXpert MRSA Assay (FDA approved for NASAL specimens only), is one component of a comprehensive MRSA colonization surveillance program. It is not intended to diagnose MRSA infection nor to guide or monitor treatment for MRSA infections.   Culture, blood (routine x 2)     Status: None   Collection Time: 11/29/14  1:19 PM  Result Value Ref Range Status   Specimen Description BLOOD  Final   Special Requests Immunocompromised  Final   Culture NO GROWTH 5 DAYS  Final   Report Status 12/04/2014 FINAL  Final  Urine culture     Status: None   Collection Time: 11/30/14 10:12 AM  Result Value Ref Range Status   Specimen Description URINE, CATHETERIZED  Final    Special Requests Immunocompromised  Final   Culture NO GROWTH 2 DAYS  Final   Report Status 12/02/2014 FINAL  Final  Culture, blood (routine x 2)     Status: None   Collection Time: 12/06/14  8:08 PM  Result Value Ref Range Status   Specimen Description BLOOD  Final   Special Requests NONE  Final   Culture  Setup Time   Final    GRAM POSITIVE COCCI IN BOTH AEROBIC AND ANAEROBIC BOTTLES CRITICAL RESULT CALLED TO, READ BACK BY AND VERIFIED WITH: JENNIFER BEZARD AT 7829 12/08/14.PMH CONFIRMED BY [facility]    Culture   Final    STAPHYLOCOCCUS AURICULARIS IN BOTH AEROBIC AND ANAEROBIC BOTTLES    Report Status 12/11/2014 FINAL  Final   Organism ID, Bacteria STAPHYLOCOCCUS AURICULARIS  Final      Susceptibility   Staphylococcus auricularis - MIC*    CIPROFLOXACIN >=8 RESISTANT Resistant     ERYTHROMYCIN >=8 RESISTANT Resistant     GENTAMICIN <=0.5 SENSITIVE Sensitive     OXACILLIN >=4 RESISTANT Resistant     TETRACYCLINE <=1 SENSITIVE Sensitive     VANCOMYCIN 1 SENSITIVE Sensitive     CLINDAMYCIN <=0.25 SENSITIVE Sensitive     TRIMETH/SULFA Value in next row Sensitive      SENSITIVE<=20    LEVOFLOXACIN Value in next row Intermediate      INTERMEDIATE4    * STAPHYLOCOCCUS AURICULARIS  Culture, blood (routine x 2)     Status: None   Collection Time: 12/06/14  8:40 PM  Result Value Ref Range Status   Specimen Description BLOOD  Final   Special Requests NONE  Final   Culture NO GROWTH 5 DAYS  Final   Report Status 12/11/2014 FINAL  Final  Culture, blood (routine x 2)     Status: None   Collection Time: 12/08/14 11:40 AM  Result Value Ref Range Status   Specimen Description BLOOD  Final   Special Requests Normal  Final   Culture NO GROWTH 6 DAYS  Final   Report Status 12/14/2014 FINAL  Final  Culture, blood (routine x 2)     Status: None  Collection Time: 12/08/14 11:49 AM  Result Value Ref Range Status   Specimen Description BLOOD  Final   Special Requests Normal  Final   Culture  NO GROWTH 6 DAYS  Final   Report Status 12/14/2014 FINAL  Final  C difficile quick scan w PCR reflex ([facility] only)     Status: None   Collection Time: 12/14/14 10:11 AM  Result Value Ref Range Status   C Diff antigen NEGATIVE  Final   C Diff toxin NEGATIVE  Final   C Diff interpretation Negative for C. difficile  Final    Coagulation Studies: No results for input(s): LABPROT, INR in the last 72 hours.  Urinalysis: No results for input(s): COLORURINE, LABSPEC, PHURINE, GLUCOSEU, HGBUR, BILIRUBINUR, KETONESUR, PROTEINUR, UROBILINOGEN, NITRITE, LEUKOCYTESUR in the last 72 hours.  Invalid input(s): APPERANCEUR    Imaging: No results found.   Medications:   . Marland KitchenTPN (CLINIMIX-E) Adult 70 mL/hr at 12/20/14 1746  . Marland KitchenTPN (CLINIMIX-E) Adult     . antiseptic oral rinse  7 mL Mouth Rinse BID  . insulin aspart  0-9 Units Subcutaneous Q6H  . methylPREDNISolone (SOLU-MEDROL) injection  60 mg Intravenous Once  . mometasone-formoterol  2 puff Inhalation BID  . pantoprazole (PROTONIX) IV  40 mg Intravenous Q12H  . potassium chloride  20 mEq Oral BID  . scopolamine  1 patch Transdermal Q72H  . thiamine IV  100 mg Intravenous Daily  . ziprasidone  10 mg Intramuscular QHS   sodium chloride, sodium chloride, sodium chloride, acetaminophen **OR** acetaminophen, albuterol, haloperidol lactate, heparin, heparin lock flush, lidocaine (PF), lidocaine-prilocaine, magnesium hydroxide, metoprolol, morphine injection, ondansetron (ZOFRAN) IV, promethazine, sodium chloride, sodium chloride, sodium chloride, ziprasidone  Assessment/ Plan:  55 AAM with progressive mantle cell lymphoma, RICE chemo in 11/2014, hx left sided hydronephrosis and hydroureter. S/p ureteral stent placement    1.  Acute renal failure due to ATN: Pt has undergone 3 dialysis sessions.  Mental status does appear slightly improved.   Patient seen during dialysis Tolerating well  Next HD probably monday  2.  Sepsis with klebsiella  oxytoca: completed two weeks for IV ceftazidime.  Appears resolved.    3.  Anemia unspecified D64.9: hgb 8.4 at present.  Consider epogen, but defer this decision to hematology.  4. Hypokalemia: no potassium in TPN. K 3.6;  however pt is on K repletion.  Phos 4.6    LOS: 22 Ambrosio Reuter 7/9/201612:30 PM

## 2014-12-21 NOTE — Progress Notes (Signed)
HD tx completed.

## 2014-12-21 NOTE — Progress Notes (Signed)
Pt had a 8 beat run of SVT and bp is low at 89/58 after dialysis. MD Gouru was made aware. No further orders.

## 2014-12-21 NOTE — Progress Notes (Addendum)
PARENTERAL NUTRITION CONSULT NOTE - FOLLOW UP  Pharmacy Consult for Electrolyte supplementation and glucose management Indication:TPN  No Known Allergies  Patient Measurements: Height: 6' (182.9 cm) Weight: 129 lb 6.4 oz (58.695 kg) IBW/kg (Calculated) : 77.6   Vital Signs: Temp: 97.5 F (36.4 C) (07/09 1045) Temp Source: Axillary (07/09 1045) BP: 105/55 mmHg (07/09 1100) Pulse Rate: 110 (07/09 1100) Intake/Output from previous day: 07/08 0701 - 07/09 0700 In: 840 [TPN:840] Out: 1825 [Urine:1200; Emesis/NG output:100; Stool:525] Intake/Output from this shift:    Labs:  Recent Labs  12/19/14 0800 12/20/14 0513 12/21/14 0629  WBC 10.3 11.4* 10.4  HGB 8.8* 8.3* 8.4*  HCT 26.5* 25.2* 24.9*  PLT 77* 104* 146*     Recent Labs  12/18/14 1353 12/19/14 0800 12/20/14 0513 12/21/14 0629  NA 138 139 138 139  K 4.2 3.8 3.3* 3.6  CL 109 105 101 106  CO2 20* 25 26 24   GLUCOSE 133* 369* 141* 136*  BUN 90* 69* 45* 72*  CREATININE 5.11* 4.19* 3.10* 4.50*  CALCIUM 8.1* 8.7* 8.5* 8.5*  MG  --  1.8  --  1.9  PHOS 2.3*  2.2*  --   --  4.7*  PROT  --   --   --  6.3*  ALBUMIN 2.2*  --   --  2.5*  AST  --   --   --  26  ALT  --   --   --  23  ALKPHOS  --   --   --  216*  BILITOT  --   --   --  0.8  TRIG  --   --   --  118   Estimated Creatinine Clearance: 13.2 mL/min (by C-G formula based on Cr of 4.5).    Recent Labs  12/20/14 2037 12/21/14 0159 12/21/14 0716  GLUCAP 136* 170* 157*    Medications:  Scheduled:  . antiseptic oral rinse  7 mL Mouth Rinse BID  . insulin aspart  0-9 Units Subcutaneous Q6H  . methylPREDNISolone (SOLU-MEDROL) injection  60 mg Intravenous Once  . mometasone-formoterol  2 puff Inhalation BID  . pantoprazole (PROTONIX) IV  40 mg Intravenous Q12H  . potassium chloride  20 mEq Oral BID  . scopolamine  1 patch Transdermal Q72H  . thiamine IV  100 mg Intravenous Daily  . ziprasidone  10 mg Intramuscular QHS   Infusions:  . Marland KitchenTPN  (CLINIMIX-E) Adult 70 mL/hr at 12/20/14 1746  . Marland KitchenTPN (CLINIMIX-E) Adult      Insulin Requirements in the past 24 hours:  8 units of Novolog BG ranging 118-170  Current Nutrition:  TPN 5/20% with E at 70 mL/hr  Assessment: Patient is a 69 yo male admitted with mantle cell lymphoma and sepsis.  Pharmacy consulted to manage electrolytes and glucose while receiving TPN.   Electrolytes within normal limits with the exception of elevated Phosphorus level.    Plan:  Will order order scheduled insulin at this time. No electrolyte supplementation required at this time. Phosphorus elevated but patient has been receiving HD treatments.  Per nephrology notes, will evaluate for possible HD today.  Patient previously on TPN formulation with no E.  May need to reconsider using a formulation with no electrolytes if HD is not continued.   Murrell Converse, PharmD Clinical Pharmacist 12/21/2014

## 2014-12-22 LAB — BASIC METABOLIC PANEL
Anion gap: 6 (ref 5–15)
BUN: 40 mg/dL — AB (ref 6–20)
CHLORIDE: 102 mmol/L (ref 101–111)
CO2: 31 mmol/L (ref 22–32)
CREATININE: 3.22 mg/dL — AB (ref 0.61–1.24)
Calcium: 8.4 mg/dL — ABNORMAL LOW (ref 8.9–10.3)
GFR calc Af Amer: 21 mL/min — ABNORMAL LOW (ref >60)
GFR calc non Af Amer: 18 mL/min — ABNORMAL LOW (ref >60)
Glucose, Bld: 132 mg/dL — ABNORMAL HIGH (ref 65–99)
POTASSIUM: 3.8 mmol/L (ref 3.5–5.1)
Sodium: 139 mmol/L (ref 135–145)

## 2014-12-22 LAB — PHOSPHORUS: Phosphorus: 3.4 mg/dL (ref 2.5–4.6)

## 2014-12-22 LAB — MAGNESIUM: Magnesium: 1.9 mg/dL (ref 1.7–2.4)

## 2014-12-22 LAB — GLUCOSE, CAPILLARY
GLUCOSE-CAPILLARY: 143 mg/dL — AB (ref 65–99)
GLUCOSE-CAPILLARY: 150 mg/dL — AB (ref 65–99)
GLUCOSE-CAPILLARY: 143 mg/dL — AB (ref 65–99)
Glucose-Capillary: 150 mg/dL — ABNORMAL HIGH (ref 65–99)
Glucose-Capillary: 134 mg/dL — ABNORMAL HIGH (ref 65–99)

## 2014-12-22 MED ORDER — FAT EMULSION 20 % IV EMUL
500.0000 mL | INTRAVENOUS | Status: AC
  Administered 2014-12-23: 500 mL via INTRAVENOUS
  Filled 2014-12-22 (×3): qty 500

## 2014-12-22 MED ORDER — TRACE MINERALS CR-CU-MN-SE-ZN 10-1000-500-60 MCG/ML IV SOLN
INTRAVENOUS | Status: AC
  Administered 2014-12-22: 18:00:00 via INTRAVENOUS
  Filled 2014-12-22: qty 1680

## 2014-12-22 NOTE — Progress Notes (Signed)
Subjective:  Pt seems a bit more awake today. Only oriented to self Son at bedside TPN continued    Objective:  Vital signs in last 24 hours:  Temp:  [97.8 F (36.6 C)-98.2 F (36.8 C)] 97.8 F (36.6 C) (07/10 0431) Pulse Rate:  [105-116] 109 (07/10 0431) Resp:  [18-32] 20 (07/10 0431) BP: (73-100)/(48-63) 93/57 mmHg (07/10 0431) SpO2:  [100 %] 100 % (07/10 0431) Weight:  [59.784 kg (131 lb 12.8 oz)-60.328 kg (133 lb)] 59.784 kg (131 lb 12.8 oz) (07/10 0431)  Weight change: 1.633 kg (3 lb 9.6 oz) Filed Weights   12/21/14 0538 12/21/14 1427 12/22/14 0431  Weight: 58.695 kg (129 lb 6.4 oz) 60.328 kg (133 lb) 59.784 kg (131 lb 12.8 oz)    Intake/Output: I/O last 3 completed shifts: In: 840  Out: 2144 [OACZY:6063; Emesis/NG output:100; Other:44; Stool:425]   Intake/Output this shift:     Physical Exam: General: awake, ill appearing  Head: NG in place, oral mucosa dry  Eyes: Anicteric  Neck: Supple, trachea midline  Lungs:  Clear to auscultation normal effort  Heart: S1S2 no rubs  Abdomen:  Soft, NTND, BS present  Extremities: Trace LE edema  Neurologic: Awake, following simple commands  Skin: No acute rashes  Access: Right femoral temp HD catheter    Basic Metabolic Panel:  Recent Labs Lab 12/16/14 0524  12/17/14 0453  12/18/14 0637 12/18/14 1353 12/19/14 0800 12/20/14 0513 12/21/14 0629 12/22/14 0443  NA 144  --  143  < > 141 138 139 138 139 139  K 3.0*  < > 3.8  < > 3.8 4.2 3.8 3.3* 3.6 3.8  CL 116*  --  123*  < > 112* 109 105 101 106 102  CO2 19*  --  16*  < > 22 20* 25 26 24 31   GLUCOSE 145*  --  157*  < > 169* 133* 369* 141* 136* 132*  BUN 89*  --  103*  < > 86* 90* 69* 45* 72* 40*  CREATININE 6.00*  --  6.31*  < > 4.89* 5.11* 4.19* 3.10* 4.50* 3.22*  CALCIUM 8.5*  --  8.5*  < > 8.3* 8.1* 8.7* 8.5* 8.5* 8.4*  MG 1.8  --  1.9  --  1.7  --  1.8  --  1.9 1.9  PHOS 4.5  --  3.7  --   --  2.3*  2.2*  --   --  4.7* 3.4  < > = values in this interval  not displayed.  Liver Function Tests:  Recent Labs Lab 12/17/14 2015 12/18/14 1353 12/21/14 0629  AST  --   --  26  ALT 15*  --  23  ALKPHOS  --   --  216*  BILITOT  --   --  0.8  PROT  --   --  6.3*  ALBUMIN  --  2.2* 2.5*   No results for input(s): LIPASE, AMYLASE in the last 168 hours. No results for input(s): AMMONIA in the last 168 hours.  CBC:  Recent Labs Lab 12/16/14 0524  12/18/14 0637 12/18/14 1353 12/19/14 0800 12/20/14 0513 12/21/14 0629  WBC 9.4  < > 8.0 8.9 10.3 11.4* 10.4  NEUTROABS 7.6  --  6.5  --  8.2* 7.7 8.9*  HGB 7.1*  < > 6.5* 6.8* 8.8* 8.3* 8.4*  HCT 22.1*  < > 19.9* 20.9* 26.5* 25.2* 24.9*  MCV 85.9  < > 84.7 85.9 86.6 86.2 87.1  PLT 59*  < >  76* 84* 77* 104* 146*  < > = values in this interval not displayed.  Cardiac Enzymes: No results for input(s): CKTOTAL, CKMB, CKMBINDEX, TROPONINI in the last 168 hours.  BNP: Invalid input(s): POCBNP  CBG:  Recent Labs Lab 12/21/14 1425 12/21/14 1618 12/21/14 2032 12/22/14 0329 12/22/14 0740  GLUCAP 147* 151* 131* 143* 150*    Microbiology: Results for orders placed or performed during the hospital encounter of 11/29/14  MRSA PCR Screening     Status: None   Collection Time: 11/29/14 12:13 PM  Result Value Ref Range Status   MRSA by PCR NEGATIVE NEGATIVE Final    Comment:        The GeneXpert MRSA Assay (FDA approved for NASAL specimens only), is one component of a comprehensive MRSA colonization surveillance program. It is not intended to diagnose MRSA infection nor to guide or monitor treatment for MRSA infections.   Culture, blood (routine x 2)     Status: None   Collection Time: 11/29/14  1:19 PM  Result Value Ref Range Status   Specimen Description BLOOD  Final   Special Requests Immunocompromised  Final   Culture NO GROWTH 5 DAYS  Final   Report Status 12/04/2014 FINAL  Final  Urine culture     Status: None   Collection Time: 11/30/14 10:12 AM  Result Value Ref Range  Status   Specimen Description URINE, CATHETERIZED  Final   Special Requests Immunocompromised  Final   Culture NO GROWTH 2 DAYS  Final   Report Status 12/02/2014 FINAL  Final  Culture, blood (routine x 2)     Status: None   Collection Time: 12/06/14  8:08 PM  Result Value Ref Range Status   Specimen Description BLOOD  Final   Special Requests NONE  Final   Culture  Setup Time   Final    GRAM POSITIVE COCCI IN BOTH AEROBIC AND ANAEROBIC BOTTLES CRITICAL RESULT CALLED TO, READ BACK BY AND VERIFIED WITH: JENNIFER BEZARD AT 1937 12/08/14.PMH CONFIRMED BY [facility]    Culture   Final    STAPHYLOCOCCUS AURICULARIS IN BOTH AEROBIC AND ANAEROBIC BOTTLES    Report Status 12/11/2014 FINAL  Final   Organism ID, Bacteria STAPHYLOCOCCUS AURICULARIS  Final      Susceptibility   Staphylococcus auricularis - MIC*    CIPROFLOXACIN >=8 RESISTANT Resistant     ERYTHROMYCIN >=8 RESISTANT Resistant     GENTAMICIN <=0.5 SENSITIVE Sensitive     OXACILLIN >=4 RESISTANT Resistant     TETRACYCLINE <=1 SENSITIVE Sensitive     VANCOMYCIN 1 SENSITIVE Sensitive     CLINDAMYCIN <=0.25 SENSITIVE Sensitive     TRIMETH/SULFA Value in next row Sensitive      SENSITIVE<=20    LEVOFLOXACIN Value in next row Intermediate      INTERMEDIATE4    * STAPHYLOCOCCUS AURICULARIS  Culture, blood (routine x 2)     Status: None   Collection Time: 12/06/14  8:40 PM  Result Value Ref Range Status   Specimen Description BLOOD  Final   Special Requests NONE  Final   Culture NO GROWTH 5 DAYS  Final   Report Status 12/11/2014 FINAL  Final  Culture, blood (routine x 2)     Status: None   Collection Time: 12/08/14 11:40 AM  Result Value Ref Range Status   Specimen Description BLOOD  Final   Special Requests Normal  Final   Culture NO GROWTH 6 DAYS  Final   Report Status 12/14/2014 FINAL  Final  Culture, blood (routine x 2)     Status: None   Collection Time: 12/08/14 11:49 AM  Result Value Ref Range Status   Specimen  Description BLOOD  Final   Special Requests Normal  Final   Culture NO GROWTH 6 DAYS  Final   Report Status 12/14/2014 FINAL  Final  C difficile quick scan w PCR reflex ([facility] only)     Status: None   Collection Time: 12/14/14 10:11 AM  Result Value Ref Range Status   C Diff antigen NEGATIVE  Final   C Diff toxin NEGATIVE  Final   C Diff interpretation Negative for C. difficile  Final    Coagulation Studies: No results for input(s): LABPROT, INR in the last 72 hours.  Urinalysis: No results for input(s): COLORURINE, LABSPEC, PHURINE, GLUCOSEU, HGBUR, BILIRUBINUR, KETONESUR, PROTEINUR, UROBILINOGEN, NITRITE, LEUKOCYTESUR in the last 72 hours.  Invalid input(s): APPERANCEUR    Imaging: No results found.   Medications:   . Marland KitchenTPN (CLINIMIX-E) Adult 70 mL/hr at 12/21/14 1755  . Marland KitchenTPN (CLINIMIX-E) Adult    . [START ON 12/23/2014] fat emulsion     . antiseptic oral rinse  7 mL Mouth Rinse BID  . insulin aspart  0-9 Units Subcutaneous Q6H  . methylPREDNISolone (SOLU-MEDROL) injection  60 mg Intravenous Once  . mometasone-formoterol  2 puff Inhalation BID  . pantoprazole (PROTONIX) IV  40 mg Intravenous Q12H  . potassium chloride  20 mEq Oral BID  . scopolamine  1 patch Transdermal Q72H  . thiamine IV  100 mg Intravenous Daily  . ziprasidone  10 mg Intramuscular QHS   sodium chloride, sodium chloride, sodium chloride, acetaminophen **OR** acetaminophen, albuterol, haloperidol lactate, heparin, heparin lock flush, lidocaine (PF), lidocaine-prilocaine, magnesium hydroxide, metoprolol, morphine injection, ondansetron (ZOFRAN) IV, promethazine, sodium chloride, sodium chloride, sodium chloride, ziprasidone  Assessment/ Plan:  19 AAM with progressive mantle cell lymphoma, RICE chemo in 11/2014, hx left sided hydronephrosis and hydroureter. S/p ureteral stent placement    1.  Acute renal failure due to ATN: Pt has undergone multiple dialysis sessions.  Mental status does appear slightly  improved after dialysis Next HD probably monday  2.  Sepsis with klebsiella oxytoca: completed two weeks for IV ceftazidime.  Appears resolved.    3.  Anemia unspecified D64.9: hgb 8.4.  Consider epogen, but defer this decision to hematology.  4. Hypokalemia: no potassium in TPN. K 3.8;  Also getting K repletion.  Phos 3.4    LOS: 23 Caleb Francis 7/10/201611:09 AM

## 2014-12-22 NOTE — Progress Notes (Signed)
[place] at [facility]                                                                                                                                                                                            Patient Demographics   Caleb Francis, is a 69 y.o. male, DOB - [birth year], SEG:[id number]  Admit date - 11/29/2014   Admitting Physician Aldean Jewett, MD  Outpatient Primary MD for the patient is No PCP Per Patient   More awake and alert today. Schedule for hemodialysis tomorrow Denies any abdominal discomfort. Status post hemodialysis yesterday NGT in place  Review of Systems:   Review of Systems  Constitutional: Negative for fever, chills and malaise/fatigue.  HENT: Negative for ear pain, hearing loss, nosebleeds and tinnitus.   Eyes: Negative for blurred vision, photophobia and pain.  Respiratory: Negative for cough, hemoptysis and shortness of breath.   Cardiovascular: Negative for chest pain, palpitations and orthopnea.  Gastrointestinal: Negative for heartburn, abdominal pain and diarrhea.  Genitourinary: Negative for dysuria and flank pain.  Musculoskeletal: Negative for myalgias and back pain.  Skin: Negative for itching and rash.  Neurological: Negative for tremors, sensory change, seizures, weakness and headaches.  Endo/Heme/Allergies: Negative for environmental allergies. Does not bruise/bleed easily.  Psychiatric/Behavioral: The patient is not nervous/anxious and does not have insomnia.      Vitals:   Filed Vitals:   12/21/14 2029 12/22/14 0431 12/22/14 1203 12/22/14 1942  BP: 89/59 93/57 99/57  93/53  Pulse: 110 109 115 110  Temp: 98.2 F (36.8 C) 97.8 F (36.6 C) 97.9 F (36.6 C) 98.9 F (37.2 C)  TempSrc:  Oral Oral   Resp: 18 20 18 20   Height:      Weight:  59.784 kg (131 lb 12.8 oz)    SpO2: 100% 100% 98% 100%    Wt Readings from Last 3 Encounters:  12/22/14 59.784 kg (131 lb 12.8 oz)  11/27/14 68.2  kg (150 lb 5.7 oz)  11/25/14 70.761 kg (156 lb)     Intake/Output Summary (Last 24 hours) at 12/22/14 2252 Last data filed at 12/22/14 1925  Gross per 24 hour  Intake      0 ml  Output   1175 ml  Net  -1175 ml    Physical Exam:   GENERAL: critically ill-appearing PERRLA Atraumatic  NECK: Supple. There is no jugular venous distention. No bruits, no lymphadenopathy, no thyromegaly.  HEART:  Tachycardic 2/6 systolic ejection murmur no rubs LUNGS: Coarse breath sounds. ABDOMEN: hypoand active bowel sounds without rebound or guarding  EXTREMITIES: No evidence of any cyanosis, clubbing,  NEUROLOGIC: Lethargic  SKIN: Moist and warm with no rashes appreciated.  Psych:    no agitation reported overnight.  Radiology Reports 12/10/2014: CT abdomen: Large distended colonic ileus. No obstruction  12/12/2014: KUB   CBC  Recent Labs Lab 12/16/14 0524  12/18/14 0637 12/18/14 1353 12/19/14 0800 12/20/14 0513 12/21/14 0629  WBC 9.4  < > 8.0 8.9 10.3 11.4* 10.4  HGB 7.1*  < > 6.5* 6.8* 8.8* 8.3* 8.4*  HCT 22.1*  < > 19.9* 20.9* 26.5* 25.2* 24.9*  PLT 59*  < > 76* 84* 77* 104* 146*  MCV 85.9  < > 84.7 85.9 86.6 86.2 87.1  MCH 27.6  < > 27.9 27.9 28.7 28.5 29.5  MCHC 32.1  < > 33.0 32.5 33.2 33.1 33.9  RDW 20.2*  < > 19.0* 19.0* 18.4* 18.6* 19.2*  LYMPHSABS 1.3  --  0.7  --  1.2 1.9 0.4*  MONOABS 0.5  --  0.8  --  0.7 1.8* 1.1*  EOSABS 0.0  --  0.0  --  0.2 0.0 0.0  BASOSABS 0.0  --  0.0  --  0.0 0.0 0.0  < > = values in this interval not displayed.  Chemistries   Recent Labs Lab 12/17/14 0453 12/17/14 2015 12/18/14 0637 12/18/14 1353 12/19/14 0800 12/20/14 0513 12/21/14 0629 12/22/14 0443  NA 143 144 141 138 139 138 139 139  K 3.8 3.8 3.8 4.2 3.8 3.3* 3.6 3.8  CL 123* 120* 112* 109 105 101 106 102  CO2 16* 14* 22 20* 25 26 24 31   GLUCOSE 157* 137* 169* 133* 369* 141* 136* 132*  BUN 103* 116* 86* 90* 69* 45* 72* 40*  CREATININE 6.31* 6.39* 4.89* 5.11* 4.19*  3.10* 4.50* 3.22*  CALCIUM 8.5* 8.8* 8.3* 8.1* 8.7* 8.5* 8.5* 8.4*  MG 1.9  --  1.7  --  1.8  --  1.9 1.9  AST  --   --   --   --   --   --  26  --   ALT  --  15*  --   --   --   --  23  --   ALKPHOS  --   --   --   --   --   --  216*  --   BILITOT  --   --   --   --   --   --  0.8  --    ------------------------------------------------------------------------------------------------------------------- No results for input(s): TSH, T4TOTAL, T3FREE, THYROIDAB in the last 72 hours.  Invalid input(s): FREET3 ------------------------------------------------------------------------------------------------------------------  Coagulation profile No results for input(s): INR, PROTIME in the last 168 hours.   Assessment & Plan  This is a 69 year old male with mantle cell lymphoma undergoing chemotherapy who was admitted June 15 from oncology clinic with altered mental status and chest pain.   # Acute renal failure due to ATN : Clinically improving Patient's creatinine is improving, had a dialysis today Had hemodialysis yesterday. Scheduled for hemodialysis on Monday Follow-up with nephrology.  # Sepsis: With Klebsiella UTI and bacteremia and Staph bacteremia Patient's urine culture and blood cultures were positive for Klebsiella on admission. Daykin. Repeat blood cx on 12/06/2014 are growing  STAPHYLOCOCCUS AURICULARIS . Vancomycin till 12/21/2014.  # Acute encephalopathy - Metabolic encephalopathy due to sepsis and urinary tract infection/ARF/Hypernatremia along with ICU delirium. - CT head/MRI brain showing no evidence of acute pathology.  Clinically improving  # Chest pain on admission: No EKG changes to suggest ischemia.  Continue to monitor for any further cardiac symptoms  # Mantle cell lymphoma: Patient is on salvage chemotherapy. Patient follows with Dr. Mike Gip.   He is s/p chemo He was on GCSF and no longer neutropenic.    # Panctytopenia: Due to chemotherapy and  Mantle cell lymphoma, patient is currently on Neupogen. Patient is s/p 2 units PRBC on 6/25 and 1 unit 12/14/2014 and has received PLT transfusion now stopped Patient needs premedication prior to any transfusions including Benadryl and Tylenol.   # Thrombocytopenia This is due to sepsis/lymphoma and chemo Improving, platelets - 10 4000   # colonic Ileus with GI bleed: CT scan shows colonic ileus that has worsened. Patient is no longer neutropenic. Rectal tube in place. Started daily miralax. .Pt is not a surgical candidate at this time. Continue supportive measures. GI signed off as patient is clinically improving NGT -  # Hypernatremia - resolved  # GI bleed: Patient currently has an NG tube placed. PPI. High risk for re bleeding due to thrombocytopenia.  # Acute blood loss anmeia over pancytopenia Due to GI losses and lymphoma.  Overall Prognosis is very poor  May be a good candidate for LTAC , follow-up with case management   DNR  DVT Prophylaxis   SCD's    Lab Results  Component Value Date   PLT 146* 12/21/2014     Poor prognosis  Total time spent 30 minutes  Greater than 50% of time spent in care and coordination  Caleb Francis M.D on 12/22/2014 at 10:52 PM  Between 7am to 6pm - Pager - [phone]  After 6pm go to [email or web address] - password [email or web address] [facility]   Office  [phone]

## 2014-12-22 NOTE — Progress Notes (Addendum)
PARENTERAL NUTRITION CONSULT NOTE - FOLLOW UP  Pharmacy Consult for Electrolyte supplementation and glucose management Indication:TPN  No Known Allergies  Patient Measurements: Height: 6' (182.9 cm) Weight: 131 lb 12.8 oz (59.784 kg) IBW/kg (Calculated) : 77.6   Vital Signs: Temp: 97.8 F (36.6 C) (07/10 0431) Temp Source: Oral (07/10 0431) BP: 93/57 mmHg (07/10 0431) Pulse Rate: 109 (07/10 0431) Intake/Output from previous day: 07/09 0701 - 07/10 0700 In: -  Out: 869 [Urine:825] Intake/Output from this shift:    Labs:  Recent Labs  12/20/14 0513 12/21/14 0629  WBC 11.4* 10.4  HGB 8.3* 8.4*  HCT 25.2* 24.9*  PLT 104* 146*     Recent Labs  12/20/14 0513 12/21/14 0629 12/22/14 0443  NA 138 139 139  K 3.3* 3.6 3.8  CL 101 106 102  CO2 26 24 31   GLUCOSE 141* 136* 132*  BUN 45* 72* 40*  CREATININE 3.10* 4.50* 3.22*  CALCIUM 8.5* 8.5* 8.4*  MG  --  1.9 1.9  PHOS  --  4.7* 3.4  PROT  --  6.3*  --   ALBUMIN  --  2.5*  --   AST  --  26  --   ALT  --  23  --   ALKPHOS  --  216*  --   BILITOT  --  0.8  --   PREALBUMIN  --  20.0  --   TRIG  --  118  --    Estimated Creatinine Clearance: 18.8 mL/min (by C-G formula based on Cr of 3.22).    Recent Labs  12/21/14 2032 12/22/14 0329 12/22/14 0740  GLUCAP 131* 143* 150*    Medications:  Scheduled:  . antiseptic oral rinse  7 mL Mouth Rinse BID  . insulin aspart  0-9 Units Subcutaneous Q6H  . mometasone-formoterol  2 puff Inhalation BID  . pantoprazole (PROTONIX) IV  40 mg Intravenous Q12H  . potassium chloride  20 mEq Oral BID  . scopolamine  1 patch Transdermal Q72H  . thiamine IV  100 mg Intravenous Daily  . ziprasidone  10 mg Intramuscular QHS   Infusions:  . Marland KitchenTPN (CLINIMIX-E) Adult 70 mL/hr at 12/21/14 1755  . Marland KitchenTPN (CLINIMIX-E) Adult    . [START ON 12/23/2014] fat emulsion      Insulin Requirements in the past 24 hours:  4 units of Novolog BG ranging 131-151  Current Nutrition:  TPN  5/20% with E at 70 mL/hr  Assessment: Patient is a 69 yo male admitted with mantle cell lymphoma and sepsis.  Pharmacy consulted to manage electrolytes and glucose while receiving TPN.   Electrolytes within normal limits with the exception of elevated Phosphorus level.    Plan:  Will not order scheduled insulin at this time. No electrolyte supplementation required at this time.   Will recheck labs in AM.  Pharmacy will continue to follow.  Murrell Converse, PharmD Clinical Pharmacist 12/22/2014

## 2014-12-23 ENCOUNTER — Inpatient Hospital Stay: Payer: Medicare Other

## 2014-12-23 LAB — GLUCOSE, CAPILLARY
GLUCOSE-CAPILLARY: 124 mg/dL — AB (ref 65–99)
GLUCOSE-CAPILLARY: 152 mg/dL — AB (ref 65–99)
Glucose-Capillary: 140 mg/dL — ABNORMAL HIGH (ref 65–99)
Glucose-Capillary: 124 mg/dL — ABNORMAL HIGH (ref 65–99)
Glucose-Capillary: 150 mg/dL — ABNORMAL HIGH (ref 65–99)
Glucose-Capillary: 148 mg/dL — ABNORMAL HIGH (ref 65–99)

## 2014-12-23 LAB — MAGNESIUM: Magnesium: 1.9 mg/dL (ref 1.7–2.4)

## 2014-12-23 LAB — CBC
HCT: 22.8 % — ABNORMAL LOW (ref 40.0–52.0)
Hemoglobin: 7.4 g/dL — ABNORMAL LOW (ref 13.0–18.0)
MCH: 28.6 pg (ref 26.0–34.0)
MCHC: 32.5 g/dL (ref 32.0–36.0)
MCV: 88 fL (ref 80.0–100.0)
PLATELETS: 194 10*3/uL (ref 150–440)
RBC: 2.59 MIL/uL — AB (ref 4.40–5.90)
RDW: 19 % — ABNORMAL HIGH (ref 11.5–14.5)
WBC: 11 10*3/uL — AB (ref 3.8–10.6)

## 2014-12-23 LAB — BASIC METABOLIC PANEL
ANION GAP: 10 (ref 5–15)
BUN: 63 mg/dL — ABNORMAL HIGH (ref 6–20)
CO2: 27 mmol/L (ref 22–32)
Calcium: 8.8 mg/dL — ABNORMAL LOW (ref 8.9–10.3)
Chloride: 102 mmol/L (ref 101–111)
Creatinine, Ser: 4.58 mg/dL — ABNORMAL HIGH (ref 0.61–1.24)
GFR calc Af Amer: 14 mL/min — ABNORMAL LOW (ref >60)
GFR calc non Af Amer: 12 mL/min — ABNORMAL LOW (ref >60)
Glucose, Bld: 114 mg/dL — ABNORMAL HIGH (ref 65–99)
Potassium: 3.8 mmol/L (ref 3.5–5.1)
Sodium: 139 mmol/L (ref 135–145)

## 2014-12-23 LAB — ALBUMIN: Albumin: 2.3 g/dL — ABNORMAL LOW (ref 3.5–5.0)

## 2014-12-23 LAB — PHOSPHORUS: Phosphorus: 5.2 mg/dL — ABNORMAL HIGH (ref 2.5–4.6)

## 2014-12-23 MED ORDER — TRACE MINERALS CR-CU-MN-SE-ZN 10-1000-500-60 MCG/ML IV SOLN
INTRAVENOUS | Status: AC
  Administered 2014-12-23 – 2014-12-24 (×2): via INTRAVENOUS
  Filled 2014-12-23: qty 1680

## 2014-12-23 NOTE — Care Management (Signed)
Important Message  Patient Details  Name: Caleb Francis MRN: [id number] Date of Birth: August 17, 1945   Medicare Important Message Given:  Yes-second notification given    [name] 12/23/2014, 1:54 PM

## 2014-12-23 NOTE — Progress Notes (Signed)
Subjective:  Pt seems a bit more awake today. Only to place and self today TPN continued  UOP 1400 cc   Objective:  Vital signs in last 24 hours:  Temp:  [97.9 F (36.6 C)-98.9 F (37.2 C)] 98.2 F (36.8 C) (07/11 0806) Pulse Rate:  [110-115] 110 (07/11 0806) Resp:  [17-20] 17 (07/11 0806) BP: (93-112)/(53-62) 95/57 mmHg (07/11 0806) SpO2:  [98 %-100 %] 100 % (07/11 0806) Weight:  [60.056 kg (132 lb 6.4 oz)] 60.056 kg (132 lb 6.4 oz) (07/11 0408)  Weight change: -0.272 kg (-9.6 oz) Filed Weights   12/21/14 1427 12/22/14 0431 12/23/14 0408  Weight: 60.328 kg (133 lb) 59.784 kg (131 lb 12.8 oz) 60.056 kg (132 lb 6.4 oz)    Intake/Output: I/O last 3 completed shifts: In: -  Out: 9357 [Urine:1725; Emesis/NG output:100]   Intake/Output this shift:     Physical Exam: General: awake, ill appearing  Head: NG in place, oral mucosa dry  Eyes: Anicteric  Neck: Supple, trachea midline  Lungs:  Clear to auscultation normal effort  Heart: S1S2 no rubs  Abdomen:  Soft, NTND, BS present  Extremities: Trace LE edema  Neurologic: Awake, following simple commands  Skin: No acute rashes  Access: Right femoral temp HD catheter  foley  Basic Metabolic Panel:  Recent Labs Lab 12/17/14 0453  12/18/14 0637 12/18/14 1353 12/19/14 0800 12/20/14 0513 12/21/14 0629 12/22/14 0443  NA 143  < > 141 138 139 138 139 139  K 3.8  < > 3.8 4.2 3.8 3.3* 3.6 3.8  CL 123*  < > 112* 109 105 101 106 102  CO2 16*  < > 22 20* 25 26 24 31   GLUCOSE 157*  < > 169* 133* 369* 141* 136* 132*  BUN 103*  < > 86* 90* 69* 45* 72* 40*  CREATININE 6.31*  < > 4.89* 5.11* 4.19* 3.10* 4.50* 3.22*  CALCIUM 8.5*  < > 8.3* 8.1* 8.7* 8.5* 8.5* 8.4*  MG 1.9  --  1.7  --  1.8  --  1.9 1.9  PHOS 3.7  --   --  2.3*  2.2*  --   --  4.7* 3.4  < > = values in this interval not displayed.  Liver Function Tests:  Recent Labs Lab 12/17/14 2015 12/18/14 1353 12/21/14 0629  AST  --   --  26  ALT 15*  --  23   ALKPHOS  --   --  216*  BILITOT  --   --  0.8  PROT  --   --  6.3*  ALBUMIN  --  2.2* 2.5*   No results for input(s): LIPASE, AMYLASE in the last 168 hours. No results for input(s): AMMONIA in the last 168 hours.  CBC:  Recent Labs Lab 12/18/14 0637 12/18/14 1353 12/19/14 0800 12/20/14 0513 12/21/14 0629  WBC 8.0 8.9 10.3 11.4* 10.4  NEUTROABS 6.5  --  8.2* 7.7 8.9*  HGB 6.5* 6.8* 8.8* 8.3* 8.4*  HCT 19.9* 20.9* 26.5* 25.2* 24.9*  MCV 84.7 85.9 86.6 86.2 87.1  PLT 76* 84* 77* 104* 146*    Cardiac Enzymes: No results for input(s): CKTOTAL, CKMB, CKMBINDEX, TROPONINI in the last 168 hours.  BNP: Invalid input(s): POCBNP  CBG:  Recent Labs Lab 12/22/14 1631 12/22/14 1940 12/23/14 0239 12/23/14 0545 12/23/14 0744  GLUCAP 143* 134* 140* 26* 150*    Microbiology: Results for orders placed or performed during the hospital encounter of 11/29/14  MRSA PCR Screening  Status: None   Collection Time: 11/29/14 12:13 PM  Result Value Ref Range Status   MRSA by PCR NEGATIVE NEGATIVE Final    Comment:        The GeneXpert MRSA Assay (FDA approved for NASAL specimens only), is one component of a comprehensive MRSA colonization surveillance program. It is not intended to diagnose MRSA infection nor to guide or monitor treatment for MRSA infections.   Culture, blood (routine x 2)     Status: None   Collection Time: 11/29/14  1:19 PM  Result Value Ref Range Status   Specimen Description BLOOD  Final   Special Requests Immunocompromised  Final   Culture NO GROWTH 5 DAYS  Final   Report Status 12/04/2014 FINAL  Final  Urine culture     Status: None   Collection Time: 11/30/14 10:12 AM  Result Value Ref Range Status   Specimen Description URINE, CATHETERIZED  Final   Special Requests Immunocompromised  Final   Culture NO GROWTH 2 DAYS  Final   Report Status 12/02/2014 FINAL  Final  Culture, blood (routine x 2)     Status: None   Collection Time: 12/06/14   8:08 PM  Result Value Ref Range Status   Specimen Description BLOOD  Final   Special Requests NONE  Final   Culture  Setup Time   Final    GRAM POSITIVE COCCI IN BOTH AEROBIC AND ANAEROBIC BOTTLES CRITICAL RESULT CALLED TO, READ BACK BY AND VERIFIED WITH: JENNIFER BEZARD AT 2426 12/08/14.PMH CONFIRMED BY R[facility]    Culture   Final    STAPHYLOCOCCUS AURICULARIS IN BOTH AEROBIC AND ANAEROBIC BOTTLES    Report Status 12/11/2014 FINAL  Final   Organism ID, Bacteria STAPHYLOCOCCUS AURICULARIS  Final      Susceptibility   Staphylococcus auricularis - MIC*    CIPROFLOXACIN >=8 RESISTANT Resistant     ERYTHROMYCIN >=8 RESISTANT Resistant     GENTAMICIN <=0.5 SENSITIVE Sensitive     OXACILLIN >=4 RESISTANT Resistant     TETRACYCLINE <=1 SENSITIVE Sensitive     VANCOMYCIN 1 SENSITIVE Sensitive     CLINDAMYCIN <=0.25 SENSITIVE Sensitive     TRIMETH/SULFA Value in next row Sensitive      SENSITIVE<=20    LEVOFLOXACIN Value in next row Intermediate      INTERMEDIATE4    * STAPHYLOCOCCUS AURICULARIS  Culture, blood (routine x 2)     Status: None   Collection Time: 12/06/14  8:40 PM  Result Value Ref Range Status   Specimen Description BLOOD  Final   Special Requests NONE  Final   Culture NO GROWTH 5 DAYS  Final   Report Status 12/11/2014 FINAL  Final  Culture, blood (routine x 2)     Status: None   Collection Time: 12/08/14 11:40 AM  Result Value Ref Range Status   Specimen Description BLOOD  Final   Special Requests Normal  Final   Culture NO GROWTH 6 DAYS  Final   Report Status 12/14/2014 FINAL  Final  Culture, blood (routine x 2)     Status: None   Collection Time: 12/08/14 11:49 AM  Result Value Ref Range Status   Specimen Description BLOOD  Final   Special Requests Normal  Final   Culture NO GROWTH 6 DAYS  Final   Report Status 12/14/2014 FINAL  Final  C difficile quick scan w PCR reflex ([facility] only)     Status: None   Collection Time: 12/14/14 10:11 AM  Result Value Ref Range  Status   C Diff antigen NEGATIVE  Final   C Diff toxin NEGATIVE  Final   C Diff interpretation Negative for C. difficile  Final    Coagulation Studies: No results for input(s): LABPROT, INR in the last 72 hours.  Urinalysis: No results for input(s): COLORURINE, LABSPEC, PHURINE, GLUCOSEU, HGBUR, BILIRUBINUR, KETONESUR, PROTEINUR, UROBILINOGEN, NITRITE, LEUKOCYTESUR in the last 72 hours.  Invalid input(s): APPERANCEUR    Imaging: No results found.   Medications:   . Marland KitchenTPN (CLINIMIX-E) Adult 70 mL/hr at 12/22/14 1800  . fat emulsion     . antiseptic oral rinse  7 mL Mouth Rinse BID  . insulin aspart  0-9 Units Subcutaneous Q6H  . mometasone-formoterol  2 puff Inhalation BID  . pantoprazole (PROTONIX) IV  40 mg Intravenous Q12H  . potassium chloride  20 mEq Oral BID  . scopolamine  1 patch Transdermal Q72H  . thiamine IV  100 mg Intravenous Daily  . ziprasidone  10 mg Intramuscular QHS   acetaminophen **OR** acetaminophen, albuterol, haloperidol lactate, heparin lock flush, lidocaine (PF), lidocaine-prilocaine, magnesium hydroxide, metoprolol, morphine injection, ondansetron (ZOFRAN) IV, promethazine, sodium chloride, sodium chloride, sodium chloride, ziprasidone  Assessment/ Plan:  76 AAM with progressive mantle cell lymphoma, RICE chemo in 11/2014, hx left sided hydronephrosis and hydroureter. S/p ureteral stent placement    1.  Acute renal failure due to ATN: Pt has undergone multiple dialysis sessions.  Mental status does appear slightly improved after dialysis - Patient seen during dialysis Tolerating well  - monitor UOP now - If UOP remains > 1200 cc will try to hold dialysis and see if renal function improved spontansously  2.  Sepsis with klebsiella oxytoca: completed two weeks for IV ceftazidime.  Appears resolved.    3.  Anemia unspecified D64.9: hgb 8.4.  Consider epogen, but defer this decision to hematology.  4. Hypokalemia: no potassium in TPN. K 3.8;  Also  getting K repletion.  Phos 3.4    LOS: 24 Caleb Francis 7/11/201610:24 AM

## 2014-12-23 NOTE — Progress Notes (Signed)
PARENTERAL NUTRITION CONSULT NOTE - FOLLOW UP  Pharmacy Consult for Electrolyte supplementation and glucose management Indication:TPN  No Known Allergies  Patient Measurements: Height: 6' (182.9 cm) Weight: 135 lb 9.3 oz (61.5 kg) IBW/kg (Calculated) : 77.6   Vital Signs: Temp: 98.5 F (36.9 C) (07/11 1345) Temp Source: Oral (07/11 1345) BP: 101/68 mmHg (07/11 1345) Pulse Rate: 116 (07/11 1345) Intake/Output from previous day: 07/10 0701 - 07/11 0700 In: -  Out: 1500 [Urine:1400; Emesis/NG output:100] Intake/Output from this shift:    Labs:  Recent Labs  12/21/14 0629 12/23/14 1046  WBC 10.4 11.0*  HGB 8.4* 7.4*  HCT 24.9* 22.8*  PLT 146* 194     Recent Labs  12/21/14 0629 12/22/14 0443 12/23/14 1046  NA 139 139 139  K 3.6 3.8 3.8  CL 106 102 102  CO2 24 31 27   GLUCOSE 136* 132* 114*  BUN 72* 40* 63*  CREATININE 4.50* 3.22* 4.58*  CALCIUM 8.5* 8.4* 8.8*  MG 1.9 1.9 1.9  PHOS 4.7* 3.4 5.2*  PROT 6.3*  --   --   ALBUMIN 2.5*  --  2.3*  AST 26  --   --   ALT 23  --   --   ALKPHOS 216*  --   --   BILITOT 0.8  --   --   PREALBUMIN 20.0  --   --   TRIG 118  --   --    Estimated Creatinine Clearance: 13.6 mL/min (by C-G formula based on Cr of 4.58).    Recent Labs  12/23/14 0239 12/23/14 0545 12/23/14 0744  GLUCAP 140* 124* 150*    Medications:  Scheduled:  . antiseptic oral rinse  7 mL Mouth Rinse BID  . insulin aspart  0-9 Units Subcutaneous Q6H  . mometasone-formoterol  2 puff Inhalation BID  . pantoprazole (PROTONIX) IV  40 mg Intravenous Q12H  . potassium chloride  20 mEq Oral BID  . scopolamine  1 patch Transdermal Q72H  . thiamine IV  100 mg Intravenous Daily  . ziprasidone  10 mg Intramuscular QHS   Infusions:  . Marland KitchenTPN (CLINIMIX-E) Adult 70 mL/hr at 12/22/14 1800  . Marland KitchenTPN (CLINIMIX-E) Adult    . fat emulsion      Insulin Requirements in the past 24 hours:  3 units of Novolog BG ranging 114-150  Current Nutrition:  TPN  5/20% with E at 70 mL/hr  Assessment: Patient is a 69 yo male admitted with mantle cell lymphoma and sepsis.  Pharmacy consulted to manage electrolytes and glucose while receiving TPN.   Electrolytes within normal limits with the exception of elevated Phosphorus level.    Plan:  Will not order scheduled insulin at this time. No electrolyte supplementation required at this time.   Will recheck labs in AM.  Pharmacy will continue to follow.  Rexene Edison, PharmD Clinical Pharmacist   12/23/2014

## 2014-12-23 NOTE — Progress Notes (Signed)
HD END 

## 2014-12-23 NOTE — Progress Notes (Signed)
POST HD 

## 2014-12-23 NOTE — Progress Notes (Signed)
Nutrition Follow-up  DOCUMENTATION CODES:  Severe malnutrition in context of acute illness/injury  INTERVENTION:   (Coordination of Care): discussed nutritional poc with MD Gouru; plan to d/c NG and rectal tube today. MD hoping to advance diet.  Recommend SLP eval prior to diet advancement as pt has been NPO for 25 days (since admission), NG has been in place since 6/23, pt very decompensated, mental status waxes and wanes. MD agreeable for SLP to evaluate swallowing prior to diet advancement. Orders placed. Discussed with [place] RN PN: recommend continuing TPN at goal rate until diet advanced and pt able to demonstrate tolerance  NUTRITION DIAGNOSIS:  Inadequate oral intake related to inability to eat as evidenced by NPO status.   GOAL:  Patient will meet greater than or equal to 90% of their needs  MONITOR:   (PN, Energy Intake, Anthropometrics, Electrolyte/Renal Profile, Glucose Profile, Digestive System)  REASON FOR ASSESSMENT:  Consult Poor PO (diet advancement)  ASSESSMENT:  Pt receiving HD again today  Diet Order: NPO  PN: 5%AA/20%Dextrose at rate of 70 ml/hr, 20% lipids  Digestive System: no abdominal xray since 7/7, flexiseal and NG being removed at present; pt verbalizes no N/V, no abdominal pain. Minimal stool via flexiseal, NG with minimal output  Electrolyte and Renal Profile:  Recent Labs Lab 12/21/14 0629 12/22/14 0443 12/23/14 1046  BUN 72* 40* 63*  CREATININE 4.50* 3.22* 4.58*  NA 139 139 139  K 3.6 3.8 3.8  MG 1.9 1.9 1.9  PHOS 4.7* 3.4 5.2*   Glucose Profile:  Recent Labs  12/23/14 0545 12/23/14 0744 12/23/14 1418  GLUCAP 124* 150* 124*   Lipid Profile:     Component Value Date/Time   TRIG 118 12/21/2014 0629    Nutritional Anemia Profile:  CBC Latest Ref Rng 12/23/2014 12/21/2014 12/20/2014  WBC 3.8 - 10.6 K/uL 11.0(H) 10.4 11.4(H)  Hemoglobin 13.0 - 18.0 g/dL 7.4(L) 8.4(L) 8.3(L)  Hematocrit 40.0 - 52.0 % 22.8(L) 24.9(L) 25.2(L)   Platelets 150 - 440 K/uL 194 146(L) 104(L)    Height:  Ht Readings from Last 1 Encounters:  11/29/14 6' (1.829 m)    Weight:  Wt Readings from Last 1 Encounters:  12/23/14 135 lb 9.3 oz (61.5 kg)     Wt Readings from Last 10 Encounters:  12/23/14 135 lb 9.3 oz (61.5 kg)  11/27/14 150 lb 5.7 oz (68.2 kg)  11/25/14 156 lb (70.761 kg)  11/18/14 157 lb 11.2 oz (71.532 kg)  11/05/14 164 lb 0.4 oz (74.4 kg)  10/24/14 165 lb (74.844 kg)  10/24/14 169 lb 5 oz (76.8 kg)  10/22/14 168 lb (76.204 kg)    BMI:  Body mass index is 18.38 kg/(m^2).  Estimated Nutritional Needs:  Kcal:  1610-9604 kcals (BEE 1436, 1.2 AF, 1.2-1.4 IF) using current wt of 62.9 kg  Protein:  76-95 (1.2-1.5 g/kg)   Fluid:  1575-1890 mL (25-30 ml/kg)   Diet Order:  Diet NPO time specified .TPN (CLINIMIX-E) Adult .TPN (CLINIMIX-E) Adult  HIGH Care Level  Kerman Passey MS, RD, LDN [phone] Pager

## 2014-12-23 NOTE — Progress Notes (Signed)
PRE HD   

## 2014-12-23 NOTE — Progress Notes (Signed)
HD START 

## 2014-12-23 NOTE — Progress Notes (Signed)
[place] at [facility]                                                                                                                                                                                            Patient Demographics   Caleb Francis, is a 69 y.o. male, DOB - Sep 13, 1945, ZJQ:[id number]  Admit date - 11/29/2014   Admitting Physician Aldean Jewett, MD  Outpatient Primary MD for the patient is No PCP Per Patient   More awake and alert today. Denies any abdominal discomfort. Positive flatus .Status post hemodialysis  NGT in place  Review of Systems:   Review of Systems  Constitutional: Negative for fever, chills and malaise/fatigue.  HENT: Negative for ear pain, hearing loss, nosebleeds and tinnitus.   Eyes: Negative for blurred vision, photophobia and pain.  Respiratory: Negative for cough, hemoptysis and shortness of breath.   Cardiovascular: Negative for chest pain, palpitations and orthopnea.  Gastrointestinal: Negative for heartburn, abdominal pain and diarrhea.  Genitourinary: Negative for dysuria and flank pain.  Musculoskeletal: Negative for myalgias and back pain.  Skin: Negative for itching and rash.  Neurological: Negative for tremors, sensory change, seizures, weakness and headaches.  Endo/Heme/Allergies: Negative for environmental allergies. Does not bruise/bleed easily.  Psychiatric/Behavioral: The patient is not nervous/anxious and does not have insomnia.      Vitals:   Filed Vitals:   12/23/14 1330 12/23/14 1336 12/23/14 1345 12/23/14 1421  BP: 102/61 102/66 101/68 88/54  Pulse: 117 118 116 118  Temp:   98.5 F (36.9 C) 98.1 F (36.7 C)  TempSrc:   Oral Oral  Resp: 15 17 19 20   Height:      Weight:   61.5 kg (135 lb 9.3 oz)   SpO2:    97%    Wt Readings from Last 3 Encounters:  12/23/14 61.5 kg (135 lb 9.3 oz)  11/27/14 68.2 kg (150 lb 5.7 oz)  11/25/14 70.761 kg (156 lb)     Intake/Output  Summary (Last 24 hours) at 12/23/14 1456 Last data filed at 12/23/14 1345  Gross per 24 hour  Intake      0 ml  Output   1150 ml  Net  -1150 ml    Physical Exam:   GENERAL: critically ill-appearing PERRLA Atraumatic  NECK: Supple. There is no jugular venous distention. No bruits, no lymphadenopathy, no thyromegaly.  HEART:  Tachycardic 2/6 systolic ejection murmur no rubs LUNGS: Coarse breath sounds. ABDOMEN: hypoand active bowel sounds without rebound or guarding  EXTREMITIES: No evidence of any cyanosis, clubbing,   NEUROLOGIC: Lethargic  SKIN: Moist and warm  with no rashes appreciated.  Psych:    no agitation reported overnight.  Radiology Reports 12/10/2014: CT abdomen: Large distended colonic ileus. No obstruction  12/12/2014: KUB   CBC  Recent Labs Lab 12/18/14 0637 12/18/14 1353 12/19/14 0800 12/20/14 0513 12/21/14 0629 12/23/14 1046  WBC 8.0 8.9 10.3 11.4* 10.4 11.0*  HGB 6.5* 6.8* 8.8* 8.3* 8.4* 7.4*  HCT 19.9* 20.9* 26.5* 25.2* 24.9* 22.8*  PLT 76* 84* 77* 104* 146* 194  MCV 84.7 85.9 86.6 86.2 87.1 88.0  MCH 27.9 27.9 28.7 28.5 29.5 28.6  MCHC 33.0 32.5 33.2 33.1 33.9 32.5  RDW 19.0* 19.0* 18.4* 18.6* 19.2* 19.0*  LYMPHSABS 0.7  --  1.2 1.9 0.4*  --   MONOABS 0.8  --  0.7 1.8* 1.1*  --   EOSABS 0.0  --  0.2 0.0 0.0  --   BASOSABS 0.0  --  0.0 0.0 0.0  --     Chemistries   Recent Labs Lab 12/17/14 2015 12/18/14 0637  12/19/14 0800 12/20/14 0513 12/21/14 0629 12/22/14 0443 12/23/14 1046  NA 144 141  < > 139 138 139 139 139  K 3.8 3.8  < > 3.8 3.3* 3.6 3.8 3.8  CL 120* 112*  < > 105 101 106 102 102  CO2 14* 22  < > 25 26 24 31 27   GLUCOSE 137* 169*  < > 369* 141* 136* 132* 114*  BUN 116* 86*  < > 69* 45* 72* 40* 63*  CREATININE 6.39* 4.89*  < > 4.19* 3.10* 4.50* 3.22* 4.58*  CALCIUM 8.8* 8.3*  < > 8.7* 8.5* 8.5* 8.4* 8.8*  MG  --  1.7  --  1.8  --  1.9 1.9 1.9  AST  --   --   --   --   --  26  --   --   ALT 15*  --   --   --   --  23   --   --   ALKPHOS  --   --   --   --   --  216*  --   --   BILITOT  --   --   --   --   --  0.8  --   --   < > = values in this interval not displayed. ------------------------------------------------------------------------------------------------------------------- No results for input(s): TSH, T4TOTAL, T3FREE, THYROIDAB in the last 72 hours.  Invalid input(s): FREET3 ------------------------------------------------------------------------------------------------------------------  Coagulation profile No results for input(s): INR, PROTIME in the last 168 hours.   Assessment & Plan  This is a 69 year old male with mantle cell lymphoma undergoing chemotherapy who was admitted June 15 from oncology clinic with altered mental status and chest pain.   # Acute renal failure due to ATN : Clinically improving Patient's creatinine is improving, had a dialysis today Follow-up with nephrology.  # Sepsis: With Klebsiella UTI and bacteremia and Staph bacteremia Patient's urine culture and blood cultures were positive for Klebsiella on admission. [place]. Repeat blood cx on 12/06/2014 are growing  STAPHYLOCOCCUS AURICULARIS . Vancomycin given until  12/21/2014 as recommended  # Acute encephalopathy - Metabolic encephalopathy due to sepsis and urinary tract infection/ARF/Hypernatremia along with ICU delirium. - CT head/MRI brain showing no evidence of acute pathology.  Clinically improving  # Chest pain on admission: No EKG changes to suggest ischemia.  Continue to monitor for any further cardiac symptoms  # Mantle cell lymphoma: Patient is on salvage chemotherapy. Patient follows with Dr. Mike Gip.  He is s/p chemo He was on GCSF and no longer neutropenic.    # Panctytopenia: Due to chemotherapy and Mantle cell lymphoma, patient is currently on Neupogen. Patient is s/p 2 units PRBC on 6/25 and 1 unit 12/14/2014 and has received PLT transfusion now stopped Patient needs premedication prior  to any transfusions including Benadryl and Tylenol.   # Thrombocytopenia This is due to sepsis/lymphoma and chemo Improving,  # colonic Ileus with GI bleed: Patient is no longer neutropenic. Clinically improving. We will discontinue NG tube and rectal tube  Start by mouth as tolerated. Dietitian consult is placed for recommendations  GI signed off as patient is clinically improving   # Hypernatremia - resolved  # GI bleed: PPI. High risk for re bleeding due to thrombocytopenia.  # Acute blood loss anmeia over pancytopenia Due to GI losses and lymphoma. Stable at this time  #Deconditioning PT consult is placed  #Disposition Skilled nursing care with hospice/LTAC Follow-up with case management  Overall Prognosis is very poor   DNR  DVT Prophylaxis   SCD's    Lab Results  Component Value Date   PLT 194 12/23/2014     Poor prognosis  Total time spent 30 minutes  Greater than 50% of time spent in care and coordination  Nicholes Mango M.D on 12/23/2014 at 2:56 PM  Between 7am to 6pm - Pager - [phone]  After 6pm go to [email or web address] - password [email or web address] [facility]   Office  [phone]

## 2014-12-23 NOTE — Care Management (Signed)
Spoke with attending regarding progression of patient.  Today ng tube will be clamped and will be started on clear liquids.  Rectal tube will be discontinued.  Even though the xrays show that ileus is not improving, clinically attending says patient has improved.  He is more alert, and bowel sounds are present.  Discussed that at present with the temporary dialysis catheter in the groin, physical therapy can not work with patient.  Spoke with patient's son today regarding discussion of LTAC as possible option.  He will discuss with his sister and call this caremanager 7/12.  Patient continues to receive  Acute hemodialysis. It has not been determined that this will be chronic.

## 2014-12-24 LAB — GLUCOSE, CAPILLARY
GLUCOSE-CAPILLARY: 132 mg/dL — AB (ref 65–99)
GLUCOSE-CAPILLARY: 130 mg/dL — AB (ref 65–99)
Glucose-Capillary: 138 mg/dL — ABNORMAL HIGH (ref 65–99)
Glucose-Capillary: 151 mg/dL — ABNORMAL HIGH (ref 65–99)
Glucose-Capillary: 118 mg/dL — ABNORMAL HIGH (ref 65–99)
Glucose-Capillary: 151 mg/dL — ABNORMAL HIGH (ref 65–99)

## 2014-12-24 LAB — MAGNESIUM: MAGNESIUM: 1.9 mg/dL (ref 1.7–2.4)

## 2014-12-24 LAB — BASIC METABOLIC PANEL
ANION GAP: 9 (ref 5–15)
BUN: 33 mg/dL — AB (ref 6–20)
CHLORIDE: 99 mmol/L — AB (ref 101–111)
CO2: 30 mmol/L (ref 22–32)
Calcium: 8.3 mg/dL — ABNORMAL LOW (ref 8.9–10.3)
Creatinine, Ser: 2.88 mg/dL — ABNORMAL HIGH (ref 0.61–1.24)
GFR calc non Af Amer: 21 mL/min — ABNORMAL LOW (ref >60)
GFR, EST AFRICAN AMERICAN: 24 mL/min — AB (ref >60)
Glucose, Bld: 156 mg/dL — ABNORMAL HIGH (ref 65–99)
POTASSIUM: 3.6 mmol/L (ref 3.5–5.1)
SODIUM: 138 mmol/L (ref 135–145)

## 2014-12-24 LAB — PHOSPHORUS: Phosphorus: 3.6 mg/dL (ref 2.5–4.6)

## 2014-12-24 MED ORDER — TRACE MINERALS CR-CU-MN-SE-ZN 10-1000-500-60 MCG/ML IV SOLN
INTRAVENOUS | Status: AC
  Administered 2014-12-24: 18:00:00 via INTRAVENOUS
  Filled 2014-12-24: qty 1680

## 2014-12-24 NOTE — Care Management Note (Signed)
I have spoken with Dr. Candiss Norse about future need for dialysis at discharge and at this time he does not know if it will be required at discharge.  I will monitor patient daily and speak with Dr. Candiss Norse about dialysis needs at discharge. Caleb Francis  [phone]  Dialysis Liaison

## 2014-12-24 NOTE — Progress Notes (Signed)
Nutrition Follow-up  DOCUMENTATION CODES:  Severe malnutrition in context of acute illness/injury  INTERVENTION:  Meals and Snacks: Cater to patient preferences as SLP able to upgrade pt diet order. Medical Food Supplement Therapy: will send [employer] on lunch and dinner trays.  PN: recommend continuing TPN at goal rate until pt able to demonstrate tolerance and able to eat atleast 50-60% of estimated needs daily. Will continue to follow.  NUTRITION DIAGNOSIS:  Inadequate oral intake related to inability to eat as evidenced by NPO status; being addressed with TPN and diet advancement  GOAL:  Patient will meet greater than or equal to 90% of their needs; ongoing  MONITOR:   (PN, Energy Intake, Anthropometrics, Electrolyte/Renal Profile, Glucose Profile, Digestive System)  ASSESSMENT:   Pt s/p HD yesterday. SLP evaluation this am.  Current Nutrition: PN: 5%AA/20% Dextrose at 67mL/hr, 20% lipids twice a week, last received lipids yesterday. Diet Order:  .TPN (CLINIMIX-E) Adult .TPN (CLINIMIX-E) Adult DIET - DYS 1 Room service appropriate?: Yes with Assist; Fluid consistency:: Thin   Gastrointestinal Profile: Rectal tube out, no stool documented since yesterday. Abdomen soft distended with hypoactive BS   Medications: KCl, Protonix, thiamine, geodon, novolog  Electrolyte/Renal Profile and Glucose Profile:   Recent Labs Lab 12/22/14 0443 12/23/14 1046 12/24/14 0556  NA 139 139 138  K 3.8 3.8 3.6  CL 102 102 99*  CO2 31 27 30   BUN 40* 63* 33*  CREATININE 3.22* 4.58* 2.88*  CALCIUM 8.4* 8.8* 8.3*  MG 1.9 1.9 1.9  PHOS 3.4 5.2* 3.6  GLUCOSE 132* 114* 156*   Protein Profile:  Recent Labs Lab 12/18/14 1353 12/21/14 0629 12/23/14 1046  ALBUMIN 2.2* 2.5* 2.3*     Weight Trend since Admission: Filed Weights   12/23/14 0408 12/23/14 0945 12/23/14 1345  Weight: 132 lb 6.4 oz (60.056 kg) 132 lb 15 oz (60.3 kg) 135 lb 9.3 oz (61.5 kg)     BMI:  Body mass  index is 18.38 kg/(m^2).  Estimated Nutritional Needs:  Kcal:  9833-8250 kcals (BEE 1436, 1.2 AF, 1.2-1.4 IF) using current wt of 62.9 kg  Protein:  76-95 (1.2-1.5 g/kg)   Fluid:  1575-1890 mL (25-30 ml/kg)   EDUCATION NEEDS:  No education needs identified at this time  [place], RD, LDN Pager [phone]

## 2014-12-24 NOTE — Evaluation (Signed)
Clinical/Bedside Swallow Evaluation Patient Details  Name: Caleb Francis MRN: [id number] Date of Birth: May 17, 1946  Today's Date: 12/24/2014 Time: SLP Start Time (ACUTE ONLY): 0940 SLP Stop Time (ACUTE ONLY): 1040 SLP Time Calculation (min) (ACUTE ONLY): 60 min  Past Medical History:  Past Medical History  Diagnosis Date  . Arthritis   . Hypertension   . RA (rheumatoid arthritis)   . Anemia   . Mantle cell lymphoma     Dr Mike Gip  . History of nuclear stress test     a. 12/2013: low risk, no sig ischemia, no EKG changes, no artifact, EF 63%  . Chronic kidney disease   . Sepsis     Dr Ola Spurr   Past Surgical History:  Past Surgical History  Procedure Laterality Date  . Back surgery    . Fracture surgery      ankle  . Portacath placement    . Cystoscopy w/ ureteral stent placement Left 10/24/2014    Procedure: CYSTOSCOPY WITH RETROGRADE PYELOGRAM/URETERAL STENT PLACEMENT;  Surgeon: Irine Seal, MD;  Location: [facility] ORS;  Service: Urology;  Laterality: Left;   HPI:  Pt is a is a 69 y.o. 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 He was in his normal state of health this morning when he presented for chemotherapy. During his chemotherapy infusion he became acutely confused and also complained of chest pain. Pt became septic w/ colonic Ileus with GI bleed: Patient is no longer neutropenic. Clinically improving per MD. NG tube and rectal tube has been discontinued and order given for BSE/po's.    Assessment / Plan / Recommendation Clinical Impression  Pt appears to present w/ fairly adequate oropharngeal swallowing abilities to tolerate purees and thin liquids via cup/straw when assisted by SLP. Pt demo no overt s/s of aspiration  and no decline in respiratory status during/post trials given. Clear vocal quality noted during assessments. During the oral phase, pt presented w/ min. decreased overall awareness and min. slower A-P transit. Given time he did swallow and clear appropriately. SLP limitied bolus volume by pinching the straw when drinking when nec. and giving small bites of foods. No significant oral motor weakness noted during oral motor movements during bolus management. Pt appears able to tolerate a puree diet w/ thin liquids w/ general aspiration precautions; meds in Puree w/ NSG. Pt appears at min. increased risk for aspiration sec. to his Cognitive status and lengthy hospitalization.     Aspiration Risk   (reduced-mild)    Diet Recommendation Dysphagia 1 (Puree);Thin   Medication Administration: Whole meds with puree (crush if nec/able) Compensations: Slow rate;Small sips/bites    Other  Recommendations Recommended Consults:  (Dietician) Oral Care Recommendations: Oral care BID;Oral care before and after PO;Staff/trained caregiver to provide oral care   Follow Up Recommendations       Frequency and Duration min 3x week  1 week   Pertinent Vitals/Pain denied    SLP Swallow Goals  see care plan   Swallow Study Prior Functional Status   pt was at [facility] for Rehab post recent hospitalization; alert/oriented and tolerating po diet per report.     General Date of Onset: 11/29/14 Other Pertinent Information: Pt is a is a 69 y.o. male with a known history of mantle cell lymphoma, bilateral malignant hydronephrosis  with ureteral stent in place, rheumatoid arthritis, hypertension, recent admission from 5/27- 6/7 for sepsis and altered mental status due to urinary tract infection who presents today with altered mental status. He was in his normal state of health this morning when he presented for chemotherapy. During his chemotherapy infusion he became acutely confused and also complained of chest pain. Pt  became septic w/ colonic Ileus with GI bleed: Patient is no longer neutropenic. Clinically improving per MD. NG tube and rectal tube has been discontinued and order given for BSE/po's.  Type of Study: Bedside swallow evaluation Previous Swallow Assessment: last admission in 10/2014 Diet Prior to this Study: Thin liquids;Regular Temperature Spikes Noted: No (WBC 11.0 yesterday) Respiratory Status: Room air Behavior/Cognition: Alert;Cooperative;Requires cueing (min. confusion but has been hospitalized for ~3weeks+) Oral Cavity - Dentition: Adequate natural dentition/normal for age (few missing) Self-Feeding Abilities: Total assist (d/t weakness) Patient Positioning: Upright in bed Baseline Vocal Quality: Normal;Low vocal intensity Volitional Cough: Strong Volitional Swallow: Able to elicit    Oral/Motor/Sensory Function Overall Oral Motor/Sensory Function: Appears within functional limits for tasks assessed (inconsistent, min. decreased follow through) Labial ROM: Within Functional Limits Labial Symmetry: Within Functional Limits Labial Strength: Within Functional Limits Lingual ROM: Within Functional Limits Lingual Symmetry: Within Functional Limits Lingual Strength: Within Functional Limits Facial Symmetry: Within Functional Limits Mandible: Within Functional Limits   Ice Chips Ice chips: Within functional limits Presentation: Spoon (fed by SLP) Other Comments: x3 trials   Thin Liquid Thin Liquid: Within functional limits Presentation: Cup;Straw (assisted by SLP) Pharyngeal  Phase Impairments:  (none noted during trials) Other Comments: min. impulsiveness taking several sips in a row    Nectar Thick Nectar Thick Liquid: Not tested   Honey Thick Honey Thick Liquid: Not tested   Puree Puree: Impaired (min.) Presentation: Spoon (fed by SLP; ~3 ozs) Oral Phase Functional Implications: Prolonged oral transit Pharyngeal Phase Impairments:  (none)   Solid   GO    Solid: Not tested  (at this eval today - ongoing assessment)       Watson,Katherine 12/24/2014,10:46 AM

## 2014-12-24 NOTE — Progress Notes (Signed)
Subjective:  Sleepy today TPN continued  UOP 1200 cc via foley   Objective:  Vital signs in last 24 hours:  Temp:  [98.1 F (36.7 C)-99.2 F (37.3 C)] 98.5 F (36.9 C) (07/12 0811) Pulse Rate:  [58-129] 113 (07/12 0811) Resp:  [15-24] 16 (07/12 0811) BP: (88-117)/(45-75) 93/45 mmHg (07/12 0811) SpO2:  [97 %-100 %] 100 % (07/12 0811) Weight:  [61.5 kg (135 lb 9.3 oz)] 61.5 kg (135 lb 9.3 oz) (07/11 1345)  Weight change: 0.244 kg (8.6 oz) Filed Weights   12/23/14 0408 12/23/14 0945 12/23/14 1345  Weight: 60.056 kg (132 lb 6.4 oz) 60.3 kg (132 lb 15 oz) 61.5 kg (135 lb 9.3 oz)    Intake/Output: I/O last 3 completed shifts: In: 2068.8  Out: 2350 [Urine:2250; Emesis/NG output:100]   Intake/Output this shift:     Physical Exam: General: chronically ill appearing  Head: oral mucosa dry  Eyes: Anicteric  Neck: Supple, trachea midline  Lungs:  Clear to auscultation normal effort  Heart: S1S2 no rubs  Abdomen:  Soft, NTND, BS present  Extremities: Trace LE edema  Neurologic: Resting quietly today  Skin: No acute rashes  Access: Right femoral temp HD catheter  foley  Basic Metabolic Panel:  Recent Labs Lab 12/18/14 1353 12/19/14 0800 12/20/14 0513 12/21/14 0629 12/22/14 0443 12/23/14 1046 12/24/14 0556  NA 138 139 138 139 139 139 138  K 4.2 3.8 3.3* 3.6 3.8 3.8 3.6  CL 109 105 101 106 102 102 99*  CO2 20* 25 26 24 31 27 30   GLUCOSE 133* 369* 141* 136* 132* 114* 156*  BUN 90* 69* 45* 72* 40* 63* 33*  CREATININE 5.11* 4.19* 3.10* 4.50* 3.22* 4.58* 2.88*  CALCIUM 8.1* 8.7* 8.5* 8.5* 8.4* 8.8* 8.3*  MG  --  1.8  --  1.9 1.9 1.9 1.9  PHOS 2.3*  2.2*  --   --  4.7* 3.4 5.2* 3.6    Liver Function Tests:  Recent Labs Lab 12/17/14 2015 12/18/14 1353 12/21/14 0629 12/23/14 1046  AST  --   --  26  --   ALT 15*  --  23  --   ALKPHOS  --   --  216*  --   BILITOT  --   --  0.8  --   PROT  --   --  6.3*  --   ALBUMIN  --  2.2* 2.5* 2.3*   No results for  input(s): LIPASE, AMYLASE in the last 168 hours. No results for input(s): AMMONIA in the last 168 hours.  CBC:  Recent Labs Lab 12/18/14 0637 12/18/14 1353 12/19/14 0800 12/20/14 0513 12/21/14 0629 12/23/14 1046  WBC 8.0 8.9 10.3 11.4* 10.4 11.0*  NEUTROABS 6.5  --  8.2* 7.7 8.9*  --   HGB 6.5* 6.8* 8.8* 8.3* 8.4* 7.4*  HCT 19.9* 20.9* 26.5* 25.2* 24.9* 22.8*  MCV 84.7 85.9 86.6 86.2 87.1 88.0  PLT 76* 84* 77* 104* 146* 194    Cardiac Enzymes: No results for input(s): CKTOTAL, CKMB, CKMBINDEX, TROPONINI in the last 168 hours.  BNP: Invalid input(s): POCBNP  CBG:  Recent Labs Lab 12/23/14 1418 12/23/14 1812 12/23/14 2111 12/24/14 0544 12/24/14 0759  GLUCAP 124* 148* 152* 138* 132*    Microbiology: Results for orders placed or performed during the hospital encounter of 11/29/14  MRSA PCR Screening     Status: None   Collection Time: 11/29/14 12:13 PM  Result Value Ref Range Status   MRSA by PCR NEGATIVE NEGATIVE  Final    Comment:        The GeneXpert MRSA Assay (FDA approved for NASAL specimens only), is one component of a comprehensive MRSA colonization surveillance program. It is not intended to diagnose MRSA infection nor to guide or monitor treatment for MRSA infections.   Culture, blood (routine x 2)     Status: None   Collection Time: 11/29/14  1:19 PM  Result Value Ref Range Status   Specimen Description BLOOD  Final   Special Requests Immunocompromised  Final   Culture NO GROWTH 5 DAYS  Final   Report Status 12/04/2014 FINAL  Final  Urine culture     Status: None   Collection Time: 11/30/14 10:12 AM  Result Value Ref Range Status   Specimen Description URINE, CATHETERIZED  Final   Special Requests Immunocompromised  Final   Culture NO GROWTH 2 DAYS  Final   Report Status 12/02/2014 FINAL  Final  Culture, blood (routine x 2)     Status: None   Collection Time: 12/06/14  8:08 PM  Result Value Ref Range Status   Specimen Description BLOOD   Final   Special Requests NONE  Final   Culture  Setup Time   Final    GRAM POSITIVE COCCI IN BOTH AEROBIC AND ANAEROBIC BOTTLES CRITICAL RESULT CALLED TO, READ BACK BY AND VERIFIED WITH: JENNIFER BEZARD AT 1093 12/08/14.PMH CONFIRMED BY RWW    Culture   Final    STAPHYLOCOCCUS AURICULARIS IN BOTH AEROBIC AND ANAEROBIC BOTTLES    Report Status 12/11/2014 FINAL  Final   Organism ID, Bacteria STAPHYLOCOCCUS AURICULARIS  Final      Susceptibility   Staphylococcus auricularis - MIC*    CIPROFLOXACIN >=8 RESISTANT Resistant     ERYTHROMYCIN >=8 RESISTANT Resistant     GENTAMICIN <=0.5 SENSITIVE Sensitive     OXACILLIN >=4 RESISTANT Resistant     TETRACYCLINE <=1 SENSITIVE Sensitive     VANCOMYCIN 1 SENSITIVE Sensitive     CLINDAMYCIN <=0.25 SENSITIVE Sensitive     TRIMETH/SULFA Value in next row Sensitive      SENSITIVE<=20    LEVOFLOXACIN Value in next row Intermediate      INTERMEDIATE4    * STAPHYLOCOCCUS AURICULARIS  Culture, blood (routine x 2)     Status: None   Collection Time: 12/06/14  8:40 PM  Result Value Ref Range Status   Specimen Description BLOOD  Final   Special Requests NONE  Final   Culture NO GROWTH 5 DAYS  Final   Report Status 12/11/2014 FINAL  Final  Culture, blood (routine x 2)     Status: None   Collection Time: 12/08/14 11:40 AM  Result Value Ref Range Status   Specimen Description BLOOD  Final   Special Requests Normal  Final   Culture NO GROWTH 6 DAYS  Final   Report Status 12/14/2014 FINAL  Final  Culture, blood (routine x 2)     Status: None   Collection Time: 12/08/14 11:49 AM  Result Value Ref Range Status   Specimen Description BLOOD  Final   Special Requests Normal  Final   Culture NO GROWTH 6 DAYS  Final   Report Status 12/14/2014 FINAL  Final  C difficile quick scan w PCR reflex ([facility] only)     Status: None   Collection Time: 12/14/14 10:11 AM  Result Value Ref Range Status   C Diff antigen NEGATIVE  Final   C Diff toxin NEGATIVE  Final    C Diff  interpretation Negative for C. difficile  Final    Coagulation Studies: No results for input(s): LABPROT, INR in the last 72 hours.  Urinalysis: No results for input(s): COLORURINE, LABSPEC, PHURINE, GLUCOSEU, HGBUR, BILIRUBINUR, KETONESUR, PROTEINUR, UROBILINOGEN, NITRITE, LEUKOCYTESUR in the last 72 hours.  Invalid input(s): APPERANCEUR    Imaging: Dg Abd 2 Views  12/23/2014   CLINICAL DATA:  Abdominal pain.  EXAM: ABDOMEN - 2 VIEW  COMPARISON:  12/18/2014  FINDINGS: Left-sided nephro ureteral stent is identified. There is a right growing dialysis catheter with tip in the intrahepatic IVC. Gaseous distension of large and small bowel loops are noted. The small bowel loops measure up to 3.6 cm. When compared with the previous exam the degree of small bowel distention is improved in the interval.  IMPRESSION: 1. Interval improvement in small bowel distention.   Electronically Signed   By: Kerby Moors M.D.   On: 12/23/2014 16:42     Medications:   . Marland KitchenTPN (CLINIMIX-E) Adult 70 mL/hr at 12/23/14 1900   . antiseptic oral rinse  7 mL Mouth Rinse BID  . insulin aspart  0-9 Units Subcutaneous Q6H  . mometasone-formoterol  2 puff Inhalation BID  . pantoprazole (PROTONIX) IV  40 mg Intravenous Q12H  . potassium chloride  20 mEq Oral BID  . scopolamine  1 patch Transdermal Q72H  . thiamine IV  100 mg Intravenous Daily  . ziprasidone  10 mg Intramuscular QHS   acetaminophen **OR** acetaminophen, albuterol, haloperidol lactate, heparin lock flush, lidocaine (PF), lidocaine-prilocaine, magnesium hydroxide, metoprolol, morphine injection, ondansetron (ZOFRAN) IV, promethazine, sodium chloride, sodium chloride, sodium chloride, ziprasidone  Assessment/ Plan:  53 AAM with progressive mantle cell lymphoma, RICE chemo in 11/2014, hx left sided hydronephrosis and hydroureter. S/p ureteral stent placement    1.  Acute renal failure due to ATN: Pt has undergone multiple dialysis sessions.   Mental status does appear slightly improved after dialysis - monitor UOP now - If UOP remains > 1200 cc will try to hold dialysis and see if renal function improved spontansously  2.  Sepsis with klebsiella oxytoca: completed two weeks for IV ceftazidime.  Appears resolved.    3.  Anemia unspecified D64.9: hgb 7.4.  Consider epogen, but defer this decision to hematology.  4. Hypokalemia: no potassium in TPN. K 3.6;  Also getting K repletion PRN.  Phos 3.6    LOS: 25 Amarian Botero 7/12/201610:08 AM

## 2014-12-24 NOTE — Progress Notes (Signed)
[place] at [facility]                                                                                                                                                                                            Patient Demographics   Caleb Francis, is a 69 y.o. male, DOB - 06-Sep-1945, EXN:[id number]  Admit date - 11/29/2014   Admitting Physician Aldean Jewett, MD  Outpatient Primary MD for the patient is No PCP Per Patient   Status post speech therapy evaluation. Tolerating by mouth. Denies any abdominal discomfort. Positive flatus  Review of Systems:   Review of Systems  Constitutional: Negative for fever, chills and malaise/fatigue.  HENT: Negative for ear pain, hearing loss, nosebleeds and tinnitus.   Eyes: Negative for blurred vision, photophobia and pain.  Respiratory: Negative for cough, hemoptysis and shortness of breath.   Cardiovascular: Negative for chest pain, palpitations and orthopnea.  Gastrointestinal: Negative for heartburn, abdominal pain and diarrhea.  Genitourinary: Negative for dysuria and flank pain.  Musculoskeletal: Negative for myalgias and back pain.  Skin: Negative for itching and rash.  Neurological: Negative for tremors, sensory change, seizures, weakness and headaches.  Endo/Heme/Allergies: Negative for environmental allergies. Does not bruise/bleed easily.  Psychiatric/Behavioral: The patient is not nervous/anxious and does not have insomnia.      Vitals:   Filed Vitals:   12/24/14 0430 12/24/14 0811 12/24/14 1113 12/24/14 1329  BP: 109/62 93/45 97/78    Pulse: 111 113 109   Temp: 98.5 F (36.9 C) 98.5 F (36.9 C) 97.7 F (36.5 C)   TempSrc: Oral Oral Oral   Resp: 23 16 20    Height:      Weight:    61.462 kg (135 lb 8 oz)  SpO2: 100% 100% 100%     Wt Readings from Last 3 Encounters:  12/24/14 61.462 kg (135 lb 8 oz)  11/27/14 68.2 kg (150 lb 5.7 oz)  11/25/14 70.761 kg (156 lb)      Intake/Output Summary (Last 24 hours) at 12/24/14 1342 Last data filed at 12/24/14 0600  Gross per 24 hour  Intake 1788.83 ml  Output   1200 ml  Net 588.83 ml    Physical Exam:   GENERAL: critically ill-appearing PERRLA Atraumatic  NECK: Supple. There is no jugular venous distention. No bruits, no lymphadenopathy, no thyromegaly.  HEART:  Tachycardic 2/6 systolic ejection murmur no rubs LUNGS: nml  breath sounds. ABDOMEN: active bowel sounds in all 4 quadrants without rebound or guarding  EXTREMITIES: No evidence of any cyanosis, clubbing,   NEUROLOGIC: Awake alert and oriented 3. Motor and sensory are  grossly intact. Reflexes 2+ SKIN: Moist and warm with no rashes appreciated.  Psych:    no agitation reported overnight.  Radiology Reports 12/10/2014: CT abdomen: Large distended colonic ileus. No obstruction  12/12/2014: KUB   CBC  Recent Labs Lab 12/18/14 0637 12/18/14 1353 12/19/14 0800 12/20/14 0513 12/21/14 0629 12/23/14 1046  WBC 8.0 8.9 10.3 11.4* 10.4 11.0*  HGB 6.5* 6.8* 8.8* 8.3* 8.4* 7.4*  HCT 19.9* 20.9* 26.5* 25.2* 24.9* 22.8*  PLT 76* 84* 77* 104* 146* 194  MCV 84.7 85.9 86.6 86.2 87.1 88.0  MCH 27.9 27.9 28.7 28.5 29.5 28.6  MCHC 33.0 32.5 33.2 33.1 33.9 32.5  RDW 19.0* 19.0* 18.4* 18.6* 19.2* 19.0*  LYMPHSABS 0.7  --  1.2 1.9 0.4*  --   MONOABS 0.8  --  0.7 1.8* 1.1*  --   EOSABS 0.0  --  0.2 0.0 0.0  --   BASOSABS 0.0  --  0.0 0.0 0.0  --     Chemistries   Recent Labs Lab 12/17/14 2015  12/19/14 0800 12/20/14 0513 12/21/14 0629 12/22/14 0443 12/23/14 1046 12/24/14 0556  NA 144  < > 139 138 139 139 139 138  K 3.8  < > 3.8 3.3* 3.6 3.8 3.8 3.6  CL 120*  < > 105 101 106 102 102 99*  CO2 14*  < > 25 26 24 31 27 30   GLUCOSE 137*  < > 369* 141* 136* 132* 114* 156*  BUN 116*  < > 69* 45* 72* 40* 63* 33*  CREATININE 6.39*  < > 4.19* 3.10* 4.50* 3.22* 4.58* 2.88*  CALCIUM 8.8*  < > 8.7* 8.5* 8.5* 8.4* 8.8* 8.3*  MG  --   < > 1.8   --  1.9 1.9 1.9 1.9  AST  --   --   --   --  26  --   --   --   ALT 15*  --   --   --  23  --   --   --   ALKPHOS  --   --   --   --  216*  --   --   --   BILITOT  --   --   --   --  0.8  --   --   --   < > = values in this interval not displayed. ------------------------------------------------------------------------------------------------------------------- No results for input(s): TSH, T4TOTAL, T3FREE, THYROIDAB in the last 72 hours.  Invalid input(s): FREET3 ------------------------------------------------------------------------------------------------------------------  Coagulation profile No results for input(s): INR, PROTIME in the last 168 hours.   Assessment & Plan  This is a 69 year old male with mantle cell lymphoma undergoing chemotherapy who was admitted June 15 from oncology clinic with altered mental status and chest pain.   # Acute renal failure due to ATN : Clinically improving Patient's creatinine is improving, had a dialysis yesterday Discussed with nephrology, recommending permanent dialysis catheter placement. Vascular surgery consult is placed  # Sepsis: With Klebsiella UTI and bacteremia and Staph bacteremia Patient's urine culture and blood cultures were positive for Klebsiella on admission. [place]. Repeat blood cx on 12/06/2014 are growing  STAPHYLOCOCCUS AURICULARIS . Vancomycin given until  12/21/2014 as recommended  # Acute encephalopathy - Metabolic encephalopathy due to sepsis and urinary tract infection/ARF/Hypernatremia along with ICU delirium. - CT head/MRI brain showing no evidence of acute pathology.  Clinically improving  # Chest pain on admission: No EKG changes to suggest ischemia.  Continue to monitor for any further cardiac  symptoms  # Mantle cell lymphoma: Patient is on salvage chemotherapy. Patient follows with Dr. Mike Gip.   He is s/p chemo He was on GCSF and no longer neutropenic.    # Panctytopenia: Due to chemotherapy and  Mantle cell lymphoma, patient is currently on Neupogen. Patient is s/p 2 units PRBC on 6/25 and 1 unit 12/14/2014 and has received PLT transfusion now stopped Patient needs premedication prior to any transfusions including Benadryl and Tylenol.   # Thrombocytopenia This is due to sepsis/lymphoma and chemo Improving,  # colonic Ileus with GI bleed: Patient is no longer neutropenic. Clinically improving.  discontinued NG tube and rectal tube 12/24/14   Tolerating by mouth diet , speech therapy has recommended dysphagia type I diet  as tolerated. Dietitian consult is placed for recommendations. We will discontinue TPN once diet is advanced   GI signed off as patient is clinically improving   # Hypernatremia - resolved  # GI bleed: PPI. High risk for re bleeding due to thrombocytopenia.  # Acute blood loss anmeia over pancytopenia Due to GI losses and lymphoma. Stable at this time  #Deconditioning PT is recommending skilled nursing facility   #Disposition Skilled nursing care  Follow-up with case management  Overall Prognosis is very poor   DNR  DVT Prophylaxis   SCD's    Lab Results  Component Value Date   PLT 194 12/23/2014     Poor prognosis  Plan of care discussed in detail with the patient and his daughter at bedside. They're in agreement with the current plan  Total time spent 30 minutes  Greater than 50% of time spent in care and coordination  Nicholes Mango M.D on 12/24/2014 at 1:42 PM  Between 7am to 6pm - Pager - [phone]  After 6pm go to [email or web address] - password [email or web address] [facility]   Office  [phone]

## 2014-12-24 NOTE — Progress Notes (Signed)
PARENTERAL NUTRITION CONSULT NOTE - FOLLOW UP  Pharmacy Consult for Electrolyte supplementation and glucose management Indication:TPN  No Known Allergies  Patient Measurements: Height: 6' (182.9 cm) Weight: 135 lb 8 oz (61.462 kg) IBW/kg (Calculated) : 77.6   Vital Signs: Temp: 97.7 F (36.5 C) (07/12 1113) Temp Source: Oral (07/12 1113) BP: 97/78 mmHg (07/12 1113) Pulse Rate: 109 (07/12 1113) Intake/Output from previous day: 07/11 0701 - 07/12 0700 In: 2068.8 [WUJ:8119.1] Out: 1200 [Urine:1200] Intake/Output from this shift:    Labs:  Recent Labs  12/23/14 1046  WBC 11.0*  HGB 7.4*  HCT 22.8*  PLT 194     Recent Labs  12/22/14 0443 12/23/14 1046 12/24/14 0556  NA 139 139 138  K 3.8 3.8 3.6  CL 102 102 99*  CO2 31 27 30   GLUCOSE 132* 114* 156*  BUN 40* 63* 33*  CREATININE 3.22* 4.58* 2.88*  CALCIUM 8.4* 8.8* 8.3*  MG 1.9 1.9 1.9  PHOS 3.4 5.2* 3.6  ALBUMIN  --  2.3*  --    Estimated Creatinine Clearance: 21.7 mL/min (by C-G formula based on Cr of 2.88).    Recent Labs  12/24/14 0759 12/24/14 1115 12/24/14 1321  GLUCAP 132* 151* 130*    Medications:  Scheduled:  . antiseptic oral rinse  7 mL Mouth Rinse BID  . insulin aspart  0-9 Units Subcutaneous Q6H  . mometasone-formoterol  2 puff Inhalation BID  . pantoprazole (PROTONIX) IV  40 mg Intravenous Q12H  . potassium chloride  20 mEq Oral BID  . scopolamine  1 patch Transdermal Q72H  . thiamine IV  100 mg Intravenous Daily  . ziprasidone  10 mg Intramuscular QHS   Infusions:  . Marland KitchenTPN (CLINIMIX-E) Adult 70 mL/hr at 12/23/14 1900  . Marland KitchenTPN (CLINIMIX-E) Adult      Insulin Requirements in the past 24 hours:  6 units of Novolog BG ranging 114-156  Current Nutrition:  TPN 5/20% with E at 70 mL/hr, also started on diet - per MD/RD will continue TPN until eating an adequate amount.   Assessment: Patient is a 69 yo male admitted with mantle cell lymphoma and sepsis.  Pharmacy consulted to  manage electrolytes and glucose while receiving TPN.   Electrolytes within normal limits  Plan:  Will not order scheduled insulin at this time. No electrolyte supplementation required at this time.   Will recheck labs in AM.  Pharmacy will continue to follow.  Rexene Edison, PharmD Clinical Pharmacist   12/24/2014

## 2014-12-24 NOTE — Evaluation (Signed)
Physical Therapy Evaluation Patient Details Name: Caleb Francis MRN: [id number] DOB: July 10, 1945 Today's Date: 12/24/2014   History of Present Illness  Pt is a 69 y.o. 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  Temporary R dialysis catheter placed in R groin 12/17/14 and currently receiving dialysis.  Pt was in ICU but currently on floor.  Pt with recent admiission 11/08/14-11/19/14 for sepsis and AMS d/t UTI.  Pt went to [facility] prior to re-admission.   Clinical Impression  Currently pt demonstrates impairments with strength, activity tolerance, ROM, and anticipate pt will have limitations with functional mobility d/t extended hospital stay and immobility.  Prior to recent hospital admissions, (in May) pt was independent ambulating with SPC.  Pt lives alone in 1 level home with 5 STE with B railing.  Currently pt demonstrates LE weakness with bed level ex's (unable to assess OOB mobility and R LE strength d/t R groin temporary dialysis catheter).  Pt would benefit from skilled PT to address above noted impairments and anticipated functional limitations.  Recommend pt discharge to STR when medically appropriate.     Follow Up Recommendations SNF    Equipment Recommendations  Other (comment) (TBD)    Recommendations for Other Services       Precautions / Restrictions Precautions Precautions: Fall Precaution Comments: R groin temporary dialysis catheter Restrictions Weight Bearing Restrictions: No      Mobility  Bed Mobility               General bed mobility comments: Deferred mobility per policy d/t R groin temporary dialysis catheter  Transfers                    Ambulation/Gait                Stairs            Wheelchair Mobility    Modified Rankin (Stroke Patients Only)       Balance                                              Pertinent Vitals/Pain Pain Assessment: No/denies pain    Home Living Family/patient expects to be discharged to:: Skilled nursing facility Living Arrangements: Alone     Home Access: Stairs to enter Entrance Stairs-Rails: Right;Left;Can reach both Entrance Stairs-Number of Steps: 5 Home Layout: One level Home Equipment: Walker - 2 wheels;Cane - single point;Shower seat      Prior Function Level of Independence: Independent with assistive device(s)         Comments: using [place] independently in May     Hand Dominance        Extremity/Trunk Assessment   Upper Extremity Assessment: Generalized weakness           Lower Extremity Assessment: RLE deficits/detail;LLE deficits/detail RLE Deficits / Details: unable to assess fully d/t R groin temporary dialysis catheter; R DF ROM to neutral; R DF 3-/5; R PF 2/5 LLE Deficits / Details: L DF ROM to neutral; L DF 3-/5; L PF 3-/5; L knee extension 3-/5; L knee flexion 2+/5; L hip flexion 2+/5 (limited assessment d/t pt in bed)     Communication   Communication: No difficulties  Cognition Arousal/Alertness: Lethargic Behavior During Therapy: [facility] for  tasks assessed/performed Overall Cognitive Status: Impaired/Different from baseline Area of Impairment: Orientation (pt reported it was June 2016) Orientation Level: Time                  General Comments  Nursing cleared pt for participation in physical therapy.  [name] son present during session.  Pt agreeable to in bed ex's.    Exercises   Performed semi-supine B LE therapeutic exercise x 10 reps:  Ankle pumps (AROM B LE's); quad sets x3 second holds (AROM L LE); SAQ's (AROM L); heelslides (AROM L), hip abd/adduction (AROM L).  Pt required vc's and tactile cues for correct technique with exercises.       Assessment/Plan    PT Assessment Patient needs continued PT services  PT Diagnosis Generalized weakness   PT Problem List  Decreased strength;Decreased range of motion;Decreased activity tolerance;Decreased mobility  PT Treatment Interventions DME instruction;Gait training;Stair training;Functional mobility training;Therapeutic activities;Therapeutic exercise;Balance training;Patient/family education   PT Goals (Current goals can be found in the Care Plan section) Acute Rehab PT Goals Patient Stated Goal: to get stronger PT Goal Formulation: With patient Time For Goal Achievement: 01/07/15 Potential to Achieve Goals: Fair    Frequency Min 2X/week   Barriers to discharge Decreased caregiver support      Co-evaluation               End of Session   Activity Tolerance: Patient limited by fatigue Patient left: in bed;with call bell/phone within reach;with bed alarm set;with family/visitor present           Time: 6948-5462 PT Time Calculation (min) (ACUTE ONLY): 24 min   Charges:   PT Evaluation $Initial PT Evaluation Tier I: 1 Procedure PT Treatments $Therapeutic Exercise: 8-22 mins   PT G CodesLeitha Bleak 01/23/2015, 12:08 PM Leitha Bleak, [place]

## 2014-12-25 LAB — GLUCOSE, CAPILLARY
GLUCOSE-CAPILLARY: 142 mg/dL — AB (ref 65–99)
Glucose-Capillary: 131 mg/dL — ABNORMAL HIGH (ref 65–99)
Glucose-Capillary: 147 mg/dL — ABNORMAL HIGH (ref 65–99)
Glucose-Capillary: 123 mg/dL — ABNORMAL HIGH (ref 65–99)
Glucose-Capillary: 157 mg/dL — ABNORMAL HIGH (ref 65–99)

## 2014-12-25 LAB — CBC
HCT: 20.3 % — ABNORMAL LOW (ref 40.0–52.0)
Hemoglobin: 6.8 g/dL — ABNORMAL LOW (ref 13.0–18.0)
MCH: 29.8 pg (ref 26.0–34.0)
MCHC: 33.7 g/dL (ref 32.0–36.0)
MCV: 88.5 fL (ref 80.0–100.0)
Platelets: 167 10*3/uL (ref 150–440)
RBC: 2.3 MIL/uL — ABNORMAL LOW (ref 4.40–5.90)
RDW: 18.8 % — AB (ref 11.5–14.5)
WBC: 11.5 10*3/uL — ABNORMAL HIGH (ref 3.8–10.6)

## 2014-12-25 LAB — BASIC METABOLIC PANEL
ANION GAP: 5 (ref 5–15)
BUN: 57 mg/dL — AB (ref 6–20)
CALCIUM: 8.3 mg/dL — AB (ref 8.9–10.3)
CHLORIDE: 105 mmol/L (ref 101–111)
CO2: 27 mmol/L (ref 22–32)
Creatinine, Ser: 4.12 mg/dL — ABNORMAL HIGH (ref 0.61–1.24)
GFR calc Af Amer: 16 mL/min — ABNORMAL LOW (ref >60)
GFR, EST NON AFRICAN AMERICAN: 14 mL/min — AB (ref >60)
GLUCOSE: 142 mg/dL — AB (ref 65–99)
Potassium: 3.8 mmol/L (ref 3.5–5.1)
SODIUM: 137 mmol/L (ref 135–145)

## 2014-12-25 LAB — PHOSPHORUS: PHOSPHORUS: 5 mg/dL — AB (ref 2.5–4.6)

## 2014-12-25 LAB — PROTIME-INR
INR: 1.22
Prothrombin Time: 15.6 seconds — ABNORMAL HIGH (ref 11.4–15.0)

## 2014-12-25 LAB — MAGNESIUM: MAGNESIUM: 1.9 mg/dL (ref 1.7–2.4)

## 2014-12-25 MED ORDER — NEPRO/CARBSTEADY PO LIQD
237.0000 mL | Freq: Two times a day (BID) | ORAL | Status: DC
  Administered 2014-12-26 – 2015-01-24 (×23): 237 mL via ORAL

## 2014-12-25 MED ORDER — FAT EMULSION 20 % IV EMUL
500.0000 mL | INTRAVENOUS | Status: AC
  Administered 2014-12-26: 500 mL via INTRAVENOUS
  Filled 2014-12-25 (×2): qty 500

## 2014-12-25 MED ORDER — NEPRO/CARBSTEADY PO LIQD
237.0000 mL | Freq: Two times a day (BID) | ORAL | Status: DC
Start: 2014-12-25 — End: 2014-12-25

## 2014-12-25 MED ORDER — TRACE MINERALS CR-CU-MN-SE-ZN 10-1000-500-60 MCG/ML IV SOLN
INTRAVENOUS | Status: AC
  Administered 2014-12-25: 19:00:00 via INTRAVENOUS
  Filled 2014-12-25: qty 1680

## 2014-12-25 NOTE — Progress Notes (Signed)
PARENTERAL NUTRITION CONSULT NOTE - FOLLOW UP  Pharmacy Consult for Electrolyte supplementation and glucose management Indication:TPN  No Known Allergies  Patient Measurements: Height: 6' (182.9 cm) Weight: 133 lb 2.5 oz (60.4 kg) IBW/kg (Calculated) : 77.6   Vital Signs: Temp: 97.5 F (36.4 C) (07/13 0919) Temp Source: Oral (07/13 0919) BP: 104/55 mmHg (07/13 0930) Pulse Rate: 117 (07/13 0919) Intake/Output from previous day: 07/12 0701 - 07/13 0700 In: 0  Out: 1250 [Urine:1250] Intake/Output from this shift:    Labs:  Recent Labs  12/23/14 1046 12/25/14 0455  WBC 11.0* 11.5*  HGB 7.4* 6.8*  HCT 22.8* 20.3*  PLT 194 167  INR  --  1.22     Recent Labs  12/23/14 1046 12/24/14 0556 12/25/14 0455  NA 139 138 137  K 3.8 3.6 3.8  CL 102 99* 105  CO2 27 30 27   GLUCOSE 114* 156* 142*  BUN 63* 33* 57*  CREATININE 4.58* 2.88* 4.12*  CALCIUM 8.8* 8.3* 8.3*  MG 1.9 1.9 1.9  PHOS 5.2* 3.6 5.0*  ALBUMIN 2.3*  --   --    Estimated Creatinine Clearance: 14.9 mL/min (by C-G formula based on Cr of 4.12).    Recent Labs  12/24/14 2043 12/25/14 0154 12/25/14 0728  GLUCAP 151* 131* 147*    Medications:  Scheduled:  . antiseptic oral rinse  7 mL Mouth Rinse BID  . insulin aspart  0-9 Units Subcutaneous Q6H  . mometasone-formoterol  2 puff Inhalation BID  . pantoprazole (PROTONIX) IV  40 mg Intravenous Q12H  . potassium chloride  20 mEq Oral BID  . scopolamine  1 patch Transdermal Q72H  . thiamine IV  100 mg Intravenous Daily  . ziprasidone  10 mg Intramuscular QHS   Infusions:  . Marland KitchenTPN (CLINIMIX-E) Adult 70 mL/hr at 12/24/14 1752    Insulin Requirements in the past 24 hours:  5 units of Novolog BG ranging 118-151  Current Nutrition:  TPN 5/20% with E at 70 mL/hr, also started on diet but poor PO intake- per RD today will continue TPN until eating an adequate amount.   Assessment: Patient is a 69 yo male admitted with mantle cell lymphoma and  sepsis.  Pharmacy consulted to manage electrolytes and glucose while receiving TPN.   Electrolytes within normal limits except elevated phos  Plan:  Will not order scheduled insulin at this time.  No electrolyte supplementation required at this time.   Phosphorus elevated but patient has been receiving HD treatments; pt is currently in HD.Previous days with phos level of 4.7, 5.2 but with return to normal levels the following day. Per previous notes, patient previously on TPN formulation with no E. May need to reconsider using a formulation with no electrolytes if HD is not continued.   Will recheck labs in AM.  Pharmacy will continue to follow.   Rayna Sexton, PharmD, BCPS Clinical Pharmacist  12/25/2014 10:12 AM

## 2014-12-25 NOTE — Progress Notes (Signed)
[facility] Hematology/Oncology Progress Note  Date of admission: 11/29/2014  Hospital day:  12/24/2014  Chief Complaint: Caleb Francis is a 69 y.o. 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  Subjective:  Fully interactive.  Sitting up in bed watching TV.  Denies pain.    Social History: The patient is accompanied by his son today.  Allergies: No Known Allergies  Scheduled Medications: . antiseptic oral rinse  7 mL Mouth Rinse BID  . insulin aspart  0-9 Units Subcutaneous Q6H  . mometasone-formoterol  2 puff Inhalation BID  . pantoprazole (PROTONIX) IV  40 mg Intravenous Q12H  . potassium chloride  20 mEq Oral BID  . scopolamine  1 patch Transdermal Q72H  . thiamine IV  100 mg Intravenous Daily  . ziprasidone  10 mg Intramuscular QHS   Review of Systems: GENERAL:  Feels "pretty good".  No fevers, sweats or weight loss.  Denies pain. PERFORMANCE STATUS (ECOG):  4 HEENT:  No visual changes, runny nose, sore throat, mouth sores or tenderness. Lungs: No shortness of breath or cough.  No hemoptysis. Cardiac:  No chest pain, palpitations, orthopnea, or PND. GI:  No nausea, vomiting, diarrhea, constipation, melena or hematochezia.  Denies abdominal pain. GU:  Denies any symptoms. Foley catheter in place. Musculoskeletal:  No back or joint pain.  Extremities:  No pain or swelling. Skin:  Denies skin issues. Neuro:  General weakness.  No headache, focal weakness or numbness. Pain:  No focal pain. Review of systems:  All other systems reviewed and found to be negative.  Physical Exam: Blood pressure 85/53, pulse 117, temperature 100 F (37.8 C), temperature source Oral, resp. rate 18, height 6' (1.829 m), weight 135 lb (61.236 kg), SpO2 100 %.  GENERAL: Chronically ill appearing gentleman lying in bed on the telemetry unit in no acute distress. MENTAL  STATUS:Alert and oriented to person, place and time (believes it is June; unaware he has been in the hospital > 3 weeks).[name] HEAD: Thin short graying hair. Normocephalic, atraumatic, face symmetric, no Cushingoid features. EYES: Brown eyes. Pupils equal round and reactive to light and accomodation. No conjunctivitis or scleral icterus. ENT: Oropharynx clear. Poor dentition.  RESPIRATORY: Decreased breath sounds at the bases.  No wheezes. CARDIOVASCULAR: Regular rate and rhythm without murmur, rub or gallop. ABDOMEN: Soft, minimal bowel sounds, and no hepatosplenomegaly. No tenderness. No guarding or rebound tenderness.  No masses.  Rectal tube out. SKIN: No rashes, ulcers or lesions. EXTREMITIES: No edema, no skin discoloration or tenderness. No palpable cords. NEUROLOGICAL:  Appropriate.  Results for orders placed or performed during the hospital encounter of 11/29/14 (from the past 48 hour(s))  Glucose, capillary     Status: Abnormal   Collection Time: 12/23/14  7:44 AM  Result Value Ref Range   Glucose-Capillary 150 (H) 65 - 99 mg/dL   Comment 1 Notify RN   CBC     Status: Abnormal   Collection Time: 12/23/14 10:46 AM  Result Value Ref Range   WBC 11.0 (H) 3.8 - 10.6 K/uL   RBC 2.59 (L) 4.40 - 5.90 MIL/uL   Hemoglobin 7.4 (L) 13.0 - 18.0 g/dL   HCT 22.8 (L) 40.0 - 52.0 %   MCV 88.0 80.0 - 100.0 fL   MCH 28.6 26.0 - 34.0 pg   MCHC 32.5 32.0 - 36.0 g/dL   RDW 19.0 (H) 11.5 - 14.5 %  Platelets 194 150 - 440 K/uL  Basic metabolic panel     Status: Abnormal   Collection Time: 12/23/14 10:46 AM  Result Value Ref Range   Sodium 139 135 - 145 mmol/L   Potassium 3.8 3.5 - 5.1 mmol/L   Chloride 102 101 - 111 mmol/L   CO2 27 22 - 32 mmol/L   Glucose, Bld 114 (H) 65 - 99 mg/dL   BUN 63 (H) 6 - 20 mg/dL   Creatinine, Ser 4.58 (H) 0.61 - 1.24 mg/dL   Calcium 8.8 (L) 8.9 - 10.3 mg/dL   GFR calc non Af Amer 12 (L) >60 mL/min   GFR calc Af Amer 14 (L) >60 mL/min    Comment:  (NOTE) The eGFR has been calculated using the CKD EPI equation. This calculation has not been validated in all clinical situations. eGFR's persistently <60 mL/min signify possible Chronic Kidney Disease.    Anion gap 10 5 - 15  Magnesium     Status: None   Collection Time: 12/23/14 10:46 AM  Result Value Ref Range   Magnesium 1.9 1.7 - 2.4 mg/dL  Phosphorus     Status: Abnormal   Collection Time: 12/23/14 10:46 AM  Result Value Ref Range   Phosphorus 5.2 (H) 2.5 - 4.6 mg/dL  Albumin     Status: Abnormal   Collection Time: 12/23/14 10:46 AM  Result Value Ref Range   Albumin 2.3 (L) 3.5 - 5.0 g/dL  Glucose, capillary     Status: Abnormal   Collection Time: 12/23/14  2:18 PM  Result Value Ref Range   Glucose-Capillary 124 (H) 65 - 99 mg/dL   Comment 1 Notify RN   Glucose, capillary     Status: Abnormal   Collection Time: 12/23/14  6:12 PM  Result Value Ref Range   Glucose-Capillary 148 (H) 65 - 99 mg/dL   Comment 1 Notify RN   Glucose, capillary     Status: Abnormal   Collection Time: 12/23/14  9:11 PM  Result Value Ref Range   Glucose-Capillary 152 (H) 65 - 99 mg/dL  Glucose, capillary     Status: Abnormal   Collection Time: 12/24/14  5:44 AM  Result Value Ref Range   Glucose-Capillary 138 (H) 65 - 99 mg/dL  Basic metabolic panel     Status: Abnormal   Collection Time: 12/24/14  5:56 AM  Result Value Ref Range   Sodium 138 135 - 145 mmol/L   Potassium 3.6 3.5 - 5.1 mmol/L   Chloride 99 (L) 101 - 111 mmol/L   CO2 30 22 - 32 mmol/L   Glucose, Bld 156 (H) 65 - 99 mg/dL   BUN 33 (H) 6 - 20 mg/dL   Creatinine, Ser 2.88 (H) 0.61 - 1.24 mg/dL   Calcium 8.3 (L) 8.9 - 10.3 mg/dL   GFR calc non Af Amer 21 (L) >60 mL/min   GFR calc Af Amer 24 (L) >60 mL/min    Comment: (NOTE) The eGFR has been calculated using the CKD EPI equation. This calculation has not been validated in all clinical situations. eGFR's persistently <60 mL/min signify possible Chronic Kidney Disease.     Anion gap 9 5 - 15  Phosphorus     Status: None   Collection Time: 12/24/14  5:56 AM  Result Value Ref Range   Phosphorus 3.6 2.5 - 4.6 mg/dL  Magnesium     Status: None   Collection Time: 12/24/14  5:56 AM  Result Value Ref Range   Magnesium 1.9  1.7 - 2.4 mg/dL  Glucose, capillary     Status: Abnormal   Collection Time: 12/24/14  7:59 AM  Result Value Ref Range   Glucose-Capillary 132 (H) 65 - 99 mg/dL   Comment 1 Notify RN    Comment 2 Document in Chart   Glucose, capillary     Status: Abnormal   Collection Time: 12/24/14 11:15 AM  Result Value Ref Range   Glucose-Capillary 151 (H) 65 - 99 mg/dL   Comment 1 Notify RN    Comment 2 Document in Chart   Glucose, capillary     Status: Abnormal   Collection Time: 12/24/14  1:21 PM  Result Value Ref Range   Glucose-Capillary 130 (H) 65 - 99 mg/dL   Comment 1 Notify RN    Comment 2 Document in Chart   Glucose, capillary     Status: Abnormal   Collection Time: 12/24/14  5:42 PM  Result Value Ref Range   Glucose-Capillary 118 (H) 65 - 99 mg/dL  Glucose, capillary     Status: Abnormal   Collection Time: 12/24/14  8:43 PM  Result Value Ref Range   Glucose-Capillary 151 (H) 65 - 99 mg/dL   Comment 1 Notify RN   Glucose, capillary     Status: Abnormal   Collection Time: 12/25/14  1:54 AM  Result Value Ref Range   Glucose-Capillary 131 (H) 65 - 99 mg/dL   Dg Abd 2 Views  12/23/2014   CLINICAL DATA:  Abdominal pain.  EXAM: ABDOMEN - 2 VIEW  COMPARISON:  12/18/2014  FINDINGS: Left-sided nephro ureteral stent is identified. There is a right growing dialysis catheter with tip in the intrahepatic IVC. Gaseous distension of large and small bowel loops are noted. The small bowel loops measure up to 3.6 cm. When compared with the previous exam the degree of small bowel distention is improved in the interval.  IMPRESSION: 1. Interval improvement in small bowel distention.   Electronically Signed   By: Kerby Moors M.D.   On: 12/23/2014 16:42     Assessment:  Caleb Francis is a 69 y.o. 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 He was admitted with Klebsiella sepsis due to pyelonephritis and associated altered mental status. He subsequently developed renal insufficiency with assciated hypernatremia. He is out of the ICU and on the telemetry unit.  He has a history of ICU psychosis and altered mental status secondary to hypernatremia.  He has progressive renal failure.  Dialysis was started on 12/18/2014.  He had a platelet transfusion reaction on 12/06/2014. Blood cultures from 12/06/2014 grew staph auricularis (coag negative staph) . He had an upper GI bleed on 12/07/2014. He last received PRBCs on 12/18/2014. Platelet count is adequate.  WBC and ANC are normal off GCSF.  He had an ileus.  A G tube and rectal tube were placement and have now been removed.  He has been evaluated by speech therapy to allow him to eat.  Consideration is being made for permanent dialysis catheter.  Plan: 1. Hematology/Oncology: Day 77 s/p chemotherapy. WBC is normal.  Patient not neutropenic.GCSF discontinued 12/12/2014.  Platelet count adequate.  Received PRBCs on 12/18/2014.  Premed all blood products with Tylenol and Benadryl. 2. Infectious disease: Klebsiella oxytoca bacteremia treated.  Cetazidime completed.  Vancomycin completed for coag negative staph.  No evidence of endocarditis by echo. No further positive blood cultures. 3. Nephrology: Hypernatremia resolved.  Progressive renal insufficiency s/p initiation of dialysis. Notes indicate  plan for permanent dialysis catheter.  4. Neurology:  Back to baseline mentation.  5. Gastroenterology: UGI bleed resolved. Ileus s/p rectal tube, now discontinued.  TPN started 12/12/2014.  Patient has been cleared to eat per speech therapy. 6. Disposition: Prognosis extremely poor.  Hospital day 25.  He has not been out of bed (debiliated).  Concern about  patient's ability to proceed to transplant in future given aggressive nature of lymphoma and delay in therapy resulting in eventual growth of disease. I spoke with the bone marrow transplant team at [facility].  They will evaluate him in the outpatient department regarding his candidacy for transplant.  He needs to be back to caring for himself (Karnofsky status at least 70%) with a dedicated caregiver to assist through the transplant process.  Patient is considering whether he wishes to consider this option given his difficulties with his last 2 cycles of chemotherapy.  Short term plan, when he is able, is to go to a skilled nursing facility for rehabilitation.  I remain doubtful that he will be a suitable transplant candidate.  DNR code status.   Lequita Asal, MD

## 2014-12-25 NOTE — Progress Notes (Signed)
Machine check 

## 2014-12-25 NOTE — Care Management Important Message (Signed)
Important Message  Patient Details  Name: Caleb Francis MRN: [id number] Date of Birth: 26-Dec-1945   Medicare Important Message Given:  Yes-third notification given    [name] 12/25/2014, 9:39 AM

## 2014-12-25 NOTE — Care Management (Signed)
Patient will have dialysis catheter moved to a site that will enable him to work with physical therapy within next 24-48 hours per Dr Candiss Norse.  His ng tube and rectal tube have been removed and patient is taking pos but in a very limited quantity.   CM did not receive return call from son or daughter 7/12 as planned.  Registered Nutritionalist will assess today as patient remains on TPN.  Spoke with patient's son and he defers any discussion regarding LTAC to his sister- [name] Rickets- 1 [phone].  [name] is agreeable to speak with representative from [facility] regarding LTAC level of care.  She does not want to consider [facility]. Discussed need for insurance approval.  Corine from [employer] will contact [place].  Adalberto Metzgar says a medicaid application has been initiated

## 2014-12-25 NOTE — Progress Notes (Signed)
Speech Therapy note: attempted to see pt today, however, he is off unit for Dialysis for ~3 hours longer. Will attempt to f/u today or tomorrow.

## 2014-12-25 NOTE — Progress Notes (Signed)
S: more alert than last week and responds appropriately acknowledges need to continue dialysis.   Denies pain  O:  Afebrile  Vital signs stable       Groin catheter is CD&I       Right shoulder with porta cath       Left neck and should CD&I  A:  Acute renal failure requiring HD which does not seem to be resolving  P:  Will plan to place a left IJ permacath on Friday       Risks and benefits were reviewed and the patient agrees to proceed.

## 2014-12-25 NOTE — Progress Notes (Signed)
Nutrition Follow-up  DOCUMENTATION CODES:   Severe malnutrition in context of acute illness/injury  INTERVENTION:  1)  PN: recommend continuing TPN at goal rate, scheduled 20% lipids to infuse tomorrow; per documentation and per [place] RN, pt ate only bites yesterday at lunch and dinner. No Breakfast this AM as pt down for HD and has not had lunch yet. May benefit from calorie count to better assess nutritional intake. Continue to assess 2)Medical Food Supplement: noted order for Nepro BID between meals, will add preference of Vanilla per Son. Will continue Magic [employer] at [employer], will add [employer] on meal trays. Continue to assess 3) Meals/Snacks: cater to pt preferences; current diet order Dysphagia I, Son reports that pt may not be eating well as he does not like the current diet consistancy. Per Belenda Cruise SLP, hoping to be able to advance diet to at least Dysphagia II within the next few days. Will assist tolerance of current diet by sending foods on meal trays that are puree consistancy naturally like yogurt, mashed potatoes, applesauce etc to maximize intake   NUTRITION DIAGNOSIS:   Inadequate oral intake related to inability to eat as evidenced by NPO status.  GOAL:   Patient will meet greater than or equal to 90% of their needs  MONITOR:    (PN, Energy Intake, Anthropometrics, Electrolyte/Renal Profile, Glucose Profile, Digestive System)  ASSESSMENT:   Pt down for HD this AM, son at bedside on visit this afternoon, pt sleeping soundly  Diet Order:  .TPN (CLINIMIX-E) Adult DIET - DYS 1 Room service appropriate?: Yes with Assist; Fluid consistency:: Thin .TPN (CLINIMIX-E) Adult   Energy Intake: recorded po intake 0% at dinner last night; per [place] RN, pt ate bites at lunch and dinner yesterday. Pt did not get breakfast this AM due to dialysis, had not received lunch tray on visit this afternoon.   Digestive System:  No N/V per pt's son  Electrolyte  and Renal Profile:  Recent Labs Lab 12/23/14 1046 12/24/14 0556 12/25/14 0455  BUN 63* 33* 57*  CREATININE 4.58* 2.88* 4.12*  NA 139 138 137  K 3.8 3.6 3.8  MG 1.9 1.9 1.9  PHOS 5.2* 3.6 5.0*   Glucose Profile:  Recent Labs  12/25/14 0154 12/25/14 0728 12/25/14 1343  GLUCAP 131* 147* 123*   Protein Profile:  Recent Labs Lab 12/21/14 0629 12/23/14 1046  ALBUMIN 2.5* 2.3*    Height:   Ht Readings from Last 1 Encounters:  11/29/14 6' (1.829 m)    Weight:   Wt Readings from Last 1 Encounters:  12/25/14 134 lb 4.2 oz (60.9 kg)   Filed Weights   12/25/14 0509 12/25/14 0919 12/25/14 1320  Weight: 135 lb (61.236 kg) 133 lb 2.5 oz (60.4 kg) 134 lb 4.2 oz (60.9 kg)     Wt Readings from Last 10 Encounters:  12/25/14 134 lb 4.2 oz (60.9 kg)  11/27/14 150 lb 5.7 oz (68.2 kg)  11/25/14 156 lb (70.761 kg)  11/18/14 157 lb 11.2 oz (71.532 kg)  11/05/14 164 lb 0.4 oz (74.4 kg)  10/24/14 165 lb (74.844 kg)  10/24/14 169 lb 5 oz (76.8 kg)  10/22/14 168 lb (76.204 kg)    BMI:  Body mass index is 18.2 kg/(m^2).  Estimated Nutritional Needs:   Kcal:  6144-3154 kcals (BEE 1436, 1.2 AF, 1.2-1.4 IF) using current wt of 62.9 kg  Protein:  76-95 (1.2-1.5 g/kg)   Fluid:  1575-1890 mL (25-30 ml/kg)   EDUCATION NEEDS:  No education needs identified at this time  [place], [place], LDN [phone] Pager

## 2014-12-25 NOTE — Progress Notes (Signed)
[place] at [facility]                                                                                                                                                                                            Patient Demographics   Caleb Francis, is a 69 y.o. male, DOB - 29-Oct-1945, PPI:[id number]  Admit date - 11/29/2014   Admitting Physician Aldean Jewett, MD  Outpatient Primary MD for the patient is No PCP Per Patient   I subjective: ntake is better denied any complaints images during the dialysis. Has tachycardia heart rate up to 1 17 bpm  Review of Systems:   Review of Systems  Constitutional: Negative for fever, chills and malaise/fatigue.  HENT: Negative for ear pain, hearing loss, nosebleeds and tinnitus.   Eyes: Negative for blurred vision, double vision, photophobia and pain.  Respiratory: Negative for cough, hemoptysis and shortness of breath.   Cardiovascular: Negative for chest pain, palpitations, orthopnea and leg swelling.  Gastrointestinal: Negative for heartburn, vomiting, abdominal pain and diarrhea.  Genitourinary: Negative for dysuria, urgency and flank pain.  Musculoskeletal: Negative for myalgias, back pain and neck pain.  Skin: Negative for itching and rash.  Neurological: Negative for dizziness, tremors, sensory change, focal weakness, seizures, weakness and headaches.  Endo/Heme/Allergies: Negative for environmental allergies. Does not bruise/bleed easily.  Psychiatric/Behavioral: Negative for memory loss. The patient is not nervous/anxious and does not have insomnia.      Vitals:   Filed Vitals:   12/25/14 1030 12/25/14 1100 12/25/14 1130 12/25/14 1200  BP: 97/62 104/64 111/63 110/86  Pulse: 115 117 116 117  Temp:      TempSrc:      Resp: 21 23 23 24   Height:      Weight:      SpO2:        Wt Readings from Last 3 Encounters:  12/25/14 60.4 kg (133 lb 2.5 oz)  11/27/14 68.2 kg (150 lb 5.7 oz)  11/25/14  70.761 kg (156 lb)     Intake/Output Summary (Last 24 hours) at 12/25/14 1223 Last data filed at 12/25/14 0803  Gross per 24 hour  Intake      0 ml  Output   1250 ml  Net  -1250 ml    Physical Exam:   GENERAL: critically ill-appearing PERRLA: Pupils equal reacting to light extraocular movements are intact. HEENT head atraumatic normocephalic. ENT unremarkable  NECK: Supple. There is no jugular venous distention. No bruits, no lymphadenopathy, no thyromegaly.  HEART:  Tachycardic 2/6 systolic ejection murmur no rubs LUNGS: nml  breath sounds. ABDOMEN: active bowel sounds  in all 4 quadrants without rebound or guarding  EXTREMITIES: No evidence of any cyanosis, clubbing,   NEUROLOGIC: Awake alert and oriented 3. Motor and sensory are grossly intact. Reflexes 2+ SKIN: Moist and warm with no rashes appreciated.  Psych:    no agitation reported overnight.  Radiology Reports 12/10/2014: CT abdomen: Large distended colonic ileus. No obstruction  12/12/2014: KUB   CBC  Recent Labs Lab 12/19/14 0800 12/20/14 0513 12/21/14 0629 12/23/14 1046 12/25/14 0455  WBC 10.3 11.4* 10.4 11.0* 11.5*  HGB 8.8* 8.3* 8.4* 7.4* 6.8*  HCT 26.5* 25.2* 24.9* 22.8* 20.3*  PLT 77* 104* 146* 194 167  MCV 86.6 86.2 87.1 88.0 88.5  MCH 28.7 28.5 29.5 28.6 29.8  MCHC 33.2 33.1 33.9 32.5 33.7  RDW 18.4* 18.6* 19.2* 19.0* 18.8*  LYMPHSABS 1.2 1.9 0.4*  --   --   MONOABS 0.7 1.8* 1.1*  --   --   EOSABS 0.2 0.0 0.0  --   --   BASOSABS 0.0 0.0 0.0  --   --     Chemistries   Recent Labs Lab 12/21/14 0629 12/22/14 0443 12/23/14 1046 12/24/14 0556 12/25/14 0455  NA 139 139 139 138 137  K 3.6 3.8 3.8 3.6 3.8  CL 106 102 102 99* 105  CO2 24 31 27 30 27   GLUCOSE 136* 132* 114* 156* 142*  BUN 72* 40* 63* 33* 57*  CREATININE 4.50* 3.22* 4.58* 2.88* 4.12*  CALCIUM 8.5* 8.4* 8.8* 8.3* 8.3*  MG 1.9 1.9 1.9 1.9 1.9  AST 26  --   --   --   --   ALT 23  --   --   --   --   ALKPHOS 216*  --   --    --   --   BILITOT 0.8  --   --   --   --    ------------------------------------------------------------------------------------------------------------------- No results for input(s): TSH, T4TOTAL, T3FREE, THYROIDAB in the last 72 hours.  Invalid input(s): FREET3 ------------------------------------------------------------------------------------------------------------------  Coagulation profile  Recent Labs Lab 12/25/14 0455  INR 1.22     Assessment & Plan  This is a 69 year old male with mantle cell lymphoma undergoing chemotherapy who was admitted June 15 from oncology clinic with altered mental status and chest pain.   # Acute renal failure due to ATN : Clinically improving Patient's creatinine is improving, had a dialysis yesterday Discussed with nephrology, recommending permanent dialysis catheter placement. Vascular surgery consult is placed, Continue hemodialysis. Patient's serum creatinine is 4.12 resting non-recovery of renal function.   # Sepsis: With Klebsiella UTI and bacteremia and Staph bacteremia Patient's urine culture and blood cultures were positive for Klebsiella on admission. [place]. Repeat blood cx on 12/06/2014 are growing  STAPHYLOCOCCUS AURICULARIS . Vancomycin given until  12/21/2014 as recommended  # Acute encephalopathy - Metabolic encephalopathy due to sepsis and urinary tract infection/ARF/Hypernatremia along with ICU delirium. - CT head/MRI brain showing no evidence of acute pathology.  Clinically improving  # Chest pain on admission: No EKG changes to suggest ischemia.  Continue to monitor for any further cardiac symptoms  # Mantle cell lymphoma: Patient is on salvage chemotherapy. Patient prognosis is extremely poor secondary to aneurysm or tumor community difficulties with previous chemotherapy. Cor status is DO NOT RESUSCITATE. I reviewed oncologist NOTES.  # Panctytopenia: Due to chemotherapy and Mantle cell lymphoma, patient is  currently on Neupogen. Patient is s/p 2 units PRBC on 6/25 and 1 unit 12/14/2014 and has received PLT transfusion  now stopped Patient needs premedication prior to any transfusions including Benadryl and Tylenol.   # Thrombocytopenia This is due to sepsis/lymphoma and chemo Improving,  # colonic Ileus with GI bleed: Patient is no longer neutropenic. Clinically improving.  discontinued NG tube and rectal tube 12/24/14   Tolerating by mouth diet , speech therapy has recommended dysphagia type I diet  as tolerated. Dietitian consult is placed for recommendations. We will discontinue TPN once diet is advanced   GI signed off as patient is clinically improving   # Hypernatremia - resolved  # GI bleed: PPI. High risk for re bleeding due to thrombocytopenia.  # Acute blood loss anmeia over pancytopenia Due to GI losses and lymphoma. Stable at this time  #Deconditioning PT is recommending skilled nursing facility   #Disposition Skilled nursing care  Follow-up with case management  Overall Prognosis is very poor   DNR  DVT Prophylaxis   SCD's    Lab Results  Component Value Date   PLT 167 12/25/2014     Poor prognosis  Time spent 35 minutes.   Epifanio Lesches M.D on 12/25/2014 at 12:23 PM  Between 7am to 6pm - Pager - [phone]  After 6pm go to [email or web address] - password [email or web address] [facility]   Office  [phone]

## 2014-12-25 NOTE — Progress Notes (Signed)
Subjective:  Sleepy today TPN continued  UOP 1250 cc via foley Appetite remains poor   Objective:  Vital signs in last 24 hours:  Temp:  [97.5 F (36.4 C)-100 F (37.8 C)] 97.5 F (36.4 C) (07/13 0919) Pulse Rate:  [111-117] 117 (07/13 1100) Resp:  [18-24] 23 (07/13 1100) BP: (83-104)/(47-64) 104/64 mmHg (07/13 1100) SpO2:  [97 %-100 %] 98 % (07/13 0919) Weight:  [60.4 kg (133 lb 2.5 oz)-61.462 kg (135 lb 8 oz)] 60.4 kg (133 lb 2.5 oz) (07/13 0919)  Weight change: 1.162 kg (2 lb 9 oz) Filed Weights   12/24/14 1329 12/25/14 0509 12/25/14 0919  Weight: 61.462 kg (135 lb 8 oz) 61.236 kg (135 lb) 60.4 kg (133 lb 2.5 oz)    Intake/Output: I/O last 3 completed shifts: In: 1070  Out: 2450 [Urine:2450]   Intake/Output this shift:     Physical Exam: General: chronically ill appearing  Head: oral mucosa dry  Eyes: Anicteric  Neck: Supple, trachea midline  Lungs:  Clear to auscultation normal effort  Heart: S1S2 no rubs  Abdomen:  Soft, NTND, BS present  Extremities: Trace LE edema  Neurologic: Resting quietly today  Skin: No acute rashes  Access: Right femoral temp HD catheter  foley  Basic Metabolic Panel:  Recent Labs Lab 12/21/14 0629 12/22/14 0443 12/23/14 1046 12/24/14 0556 12/25/14 0455  NA 139 139 139 138 137  K 3.6 3.8 3.8 3.6 3.8  CL 106 102 102 99* 105  CO2 24 31 27 30 27   GLUCOSE 136* 132* 114* 156* 142*  BUN 72* 40* 63* 33* 57*  CREATININE 4.50* 3.22* 4.58* 2.88* 4.12*  CALCIUM 8.5* 8.4* 8.8* 8.3* 8.3*  MG 1.9 1.9 1.9 1.9 1.9  PHOS 4.7* 3.4 5.2* 3.6 5.0*    Liver Function Tests:  Recent Labs Lab 12/18/14 1353 12/21/14 0629 12/23/14 1046  AST  --  26  --   ALT  --  23  --   ALKPHOS  --  216*  --   BILITOT  --  0.8  --   PROT  --  6.3*  --   ALBUMIN 2.2* 2.5* 2.3*   No results for input(s): LIPASE, AMYLASE in the last 168 hours. No results for input(s): AMMONIA in the last 168 hours.  CBC:  Recent Labs Lab 12/19/14 0800  12/20/14 0513 12/21/14 0629 12/23/14 1046 12/25/14 0455  WBC 10.3 11.4* 10.4 11.0* 11.5*  NEUTROABS 8.2* 7.7 8.9*  --   --   HGB 8.8* 8.3* 8.4* 7.4* 6.8*  HCT 26.5* 25.2* 24.9* 22.8* 20.3*  MCV 86.6 86.2 87.1 88.0 88.5  PLT 77* 104* 146* 194 167    Cardiac Enzymes: No results for input(s): CKTOTAL, CKMB, CKMBINDEX, TROPONINI in the last 168 hours.  BNP: Invalid input(s): POCBNP  CBG:  Recent Labs Lab 12/24/14 1321 12/24/14 1742 12/24/14 2043 12/25/14 0154 12/25/14 0728  GLUCAP 130* 118* 151* 131* 147*    Microbiology: Results for orders placed or performed during the hospital encounter of 11/29/14  MRSA PCR Screening     Status: None   Collection Time: 11/29/14 12:13 PM  Result Value Ref Range Status   MRSA by PCR NEGATIVE NEGATIVE Final    Comment:        The GeneXpert MRSA Assay (FDA approved for NASAL specimens only), is one component of a comprehensive MRSA colonization surveillance program. It is not intended to diagnose MRSA infection nor to guide or monitor treatment for MRSA infections.   Culture, blood (routine  x 2)     Status: None   Collection Time: 11/29/14  1:19 PM  Result Value Ref Range Status   Specimen Description BLOOD  Final   Special Requests Immunocompromised  Final   Culture NO GROWTH 5 DAYS  Final   Report Status 12/04/2014 FINAL  Final  Urine culture     Status: None   Collection Time: 11/30/14 10:12 AM  Result Value Ref Range Status   Specimen Description URINE, CATHETERIZED  Final   Special Requests Immunocompromised  Final   Culture NO GROWTH 2 DAYS  Final   Report Status 12/02/2014 FINAL  Final  Culture, blood (routine x 2)     Status: None   Collection Time: 12/06/14  8:08 PM  Result Value Ref Range Status   Specimen Description BLOOD  Final   Special Requests NONE  Final   Culture  Setup Time   Final    GRAM POSITIVE COCCI IN BOTH AEROBIC AND ANAEROBIC BOTTLES CRITICAL RESULT CALLED TO, READ BACK BY AND VERIFIED WITH:  JENNIFER BEZARD AT 7106 12/08/14.PMH CONFIRMED BY [facility]    Culture   Final    STAPHYLOCOCCUS AURICULARIS IN BOTH AEROBIC AND ANAEROBIC BOTTLES    Report Status 12/11/2014 FINAL  Final   Organism ID, Bacteria STAPHYLOCOCCUS AURICULARIS  Final      Susceptibility   Staphylococcus auricularis - MIC*    CIPROFLOXACIN >=8 RESISTANT Resistant     ERYTHROMYCIN >=8 RESISTANT Resistant     GENTAMICIN <=0.5 SENSITIVE Sensitive     OXACILLIN >=4 RESISTANT Resistant     TETRACYCLINE <=1 SENSITIVE Sensitive     VANCOMYCIN 1 SENSITIVE Sensitive     CLINDAMYCIN <=0.25 SENSITIVE Sensitive     TRIMETH/SULFA Value in next row Sensitive      SENSITIVE<=20    LEVOFLOXACIN Value in next row Intermediate      INTERMEDIATE4    * STAPHYLOCOCCUS AURICULARIS  Culture, blood (routine x 2)     Status: None   Collection Time: 12/06/14  8:40 PM  Result Value Ref Range Status   Specimen Description BLOOD  Final   Special Requests NONE  Final   Culture NO GROWTH 5 DAYS  Final   Report Status 12/11/2014 FINAL  Final  Culture, blood (routine x 2)     Status: None   Collection Time: 12/08/14 11:40 AM  Result Value Ref Range Status   Specimen Description BLOOD  Final   Special Requests Normal  Final   Culture NO GROWTH 6 DAYS  Final   Report Status 12/14/2014 FINAL  Final  Culture, blood (routine x 2)     Status: None   Collection Time: 12/08/14 11:49 AM  Result Value Ref Range Status   Specimen Description BLOOD  Final   Special Requests Normal  Final   Culture NO GROWTH 6 DAYS  Final   Report Status 12/14/2014 FINAL  Final  C difficile quick scan w PCR reflex ([facility] only)     Status: None   Collection Time: 12/14/14 10:11 AM  Result Value Ref Range Status   C Diff antigen NEGATIVE  Final   C Diff toxin NEGATIVE  Final   C Diff interpretation Negative for C. difficile  Final    Coagulation Studies:  Recent Labs  12/25/14 0455  LABPROT 15.6*  INR 1.22    Urinalysis: No results for input(s):  COLORURINE, LABSPEC, PHURINE, GLUCOSEU, HGBUR, BILIRUBINUR, KETONESUR, PROTEINUR, UROBILINOGEN, NITRITE, LEUKOCYTESUR in the last 72 hours.  Invalid input(s): APPERANCEUR  Imaging: Dg Abd 2 Views  12/23/2014   CLINICAL DATA:  Abdominal pain.  EXAM: ABDOMEN - 2 VIEW  COMPARISON:  12/18/2014  FINDINGS: Left-sided nephro ureteral stent is identified. There is a right growing dialysis catheter with tip in the intrahepatic IVC. Gaseous distension of large and small bowel loops are noted. The small bowel loops measure up to 3.6 cm. When compared with the previous exam the degree of small bowel distention is improved in the interval.  IMPRESSION: 1. Interval improvement in small bowel distention.   Electronically Signed   By: Kerby Moors M.D.   On: 12/23/2014 16:42     Medications:   . Marland KitchenTPN (CLINIMIX-E) Adult 70 mL/hr at 12/24/14 1752   . antiseptic oral rinse  7 mL Mouth Rinse BID  . insulin aspart  0-9 Units Subcutaneous Q6H  . mometasone-formoterol  2 puff Inhalation BID  . pantoprazole (PROTONIX) IV  40 mg Intravenous Q12H  . potassium chloride  20 mEq Oral BID  . scopolamine  1 patch Transdermal Q72H  . thiamine IV  100 mg Intravenous Daily  . ziprasidone  10 mg Intramuscular QHS   acetaminophen **OR** acetaminophen, albuterol, haloperidol lactate, heparin lock flush, lidocaine (PF), lidocaine-prilocaine, magnesium hydroxide, metoprolol, morphine injection, ondansetron (ZOFRAN) IV, promethazine, sodium chloride, sodium chloride, sodium chloride, ziprasidone  Assessment/ Plan:  70 AAM with progressive mantle cell lymphoma, RICE chemo in 11/2014, hx left sided hydronephrosis and hydroureter. S/p ureteral stent placement   Hospital course complicated by sepsis with Klebsiella oxytoca, hypernatremia  1.  Acute renal failure due to ATN: Pt has undergone multiple dialysis sessions.  Mental status does appear slightly improved after dialysis - monitor UOP now. Dialysis treatments were  started July 6 - Serum creatinine worsened again to 4.12 suggesting nonrecovery of renal function - We will place a consult for PermCath placement - May consider LTAC as extended recovery is anticipated  2.  Sepsis with klebsiella oxytoca: completed two weeks for IV ceftazidime.  Appears resolved.    3.  Anemia unspecified D64.9: hgb 6.8.  Consider epogen, but defer this decision to hematology. Consider blood transfusion  4. Hypokalemia:  K 3.8;  Also getting K repletion PRN.  Phos 5.0    LOS: 26 Caleb Francis 7/13/201611:31 AM

## 2014-12-25 NOTE — Progress Notes (Signed)
This note also relates to the following rows which could not be included: Resp - Cannot attach notes to unvalidated device data   Post hd tx

## 2014-12-26 LAB — GLUCOSE, CAPILLARY
GLUCOSE-CAPILLARY: 191 mg/dL — AB (ref 65–99)
Glucose-Capillary: 119 mg/dL — ABNORMAL HIGH (ref 65–99)
Glucose-Capillary: 143 mg/dL — ABNORMAL HIGH (ref 65–99)
Glucose-Capillary: 129 mg/dL — ABNORMAL HIGH (ref 65–99)

## 2014-12-26 LAB — BASIC METABOLIC PANEL
Anion gap: 9 (ref 5–15)
BUN: 30 mg/dL — ABNORMAL HIGH (ref 6–20)
CALCIUM: 8.3 mg/dL — AB (ref 8.9–10.3)
CO2: 31 mmol/L (ref 22–32)
Chloride: 103 mmol/L (ref 101–111)
Creatinine, Ser: 2.5 mg/dL — ABNORMAL HIGH (ref 0.61–1.24)
GFR calc Af Amer: 29 mL/min — ABNORMAL LOW (ref >60)
GFR calc non Af Amer: 25 mL/min — ABNORMAL LOW (ref >60)
GLUCOSE: 115 mg/dL — AB (ref 65–99)
Potassium: 4 mmol/L (ref 3.5–5.1)
Sodium: 143 mmol/L (ref 135–145)

## 2014-12-26 LAB — CREATININE, URINE, RANDOM: CREATININE, URINE: 22 mg/dL

## 2014-12-26 LAB — HEMOGLOBIN AND HEMATOCRIT, BLOOD
HCT: 20.9 % — ABNORMAL LOW (ref 40.0–52.0)
HEMOGLOBIN: 6.9 g/dL — AB (ref 13.0–18.0)

## 2014-12-26 LAB — PREPARE RBC (CROSSMATCH)

## 2014-12-26 LAB — MAGNESIUM: Magnesium: 1.9 mg/dL (ref 1.7–2.4)

## 2014-12-26 LAB — PHOSPHORUS: PHOSPHORUS: 2.4 mg/dL — AB (ref 2.5–4.6)

## 2014-12-26 MED ORDER — SODIUM CHLORIDE 0.9 % IV SOLN: Freq: Once | INTRAVENOUS | Status: DC

## 2014-12-26 MED ORDER — CEFAZOLIN SODIUM 1-5 GM-% IV SOLN
1.0000 g | INTRAVENOUS | Status: AC
  Filled 2014-12-26: qty 50

## 2014-12-26 MED ORDER — TRACE MINERALS CR-CU-MN-SE-ZN 10-1000-500-60 MCG/ML IV SOLN
INTRAVENOUS | Status: AC
  Administered 2014-12-26: 19:00:00 via INTRAVENOUS
  Filled 2014-12-26: qty 1680

## 2014-12-26 MED ORDER — DRONABINOL 2.5 MG PO CAPS
2.5000 mg | ORAL_CAPSULE | Freq: Two times a day (BID) | ORAL | Status: DC
  Administered 2014-12-26 – 2015-01-22 (×43): 2.5 mg via ORAL
  Filled 2014-12-26 (×43): qty 1

## 2014-12-26 NOTE — Progress Notes (Signed)
PARENTERAL NUTRITION CONSULT NOTE - FOLLOW UP  Pharmacy Consult for Electrolyte supplementation and glucose management Indication:TPN  No Known Allergies  Patient Measurements: Height: 6' (182.9 cm) Weight: 134 lb 4.2 oz (60.9 kg) IBW/kg (Calculated) : 77.6   Vital Signs: Temp: 99.1 F (37.3 C) (07/14 0449) Temp Source: Oral (07/14 0449) BP: 105/54 mmHg (07/14 0449) Pulse Rate: 113 (07/14 0449) Intake/Output from previous day: 07/13 0701 - 07/14 0700 In: 1930 [P.O.:600; TPN:1330] Out: 1086 [Urine:1500] Intake/Output from this shift:    Labs:  Recent Labs  12/25/14 0455  WBC 11.5*  HGB 6.8*  HCT 20.3*  PLT 167  INR 1.22     Recent Labs  12/24/14 0556 12/25/14 0455 12/26/14 0528 12/26/14 1203  NA 138 137 143  --   K 3.6 3.8 4.0  --   CL 99* 105 103  --   CO2 30 27 31   --   GLUCOSE 156* 142* 115*  --   BUN 33* 57* 30*  --   CREATININE 2.88* 4.12* 2.50*  --   LABCREA  --   --   --  22  CALCIUM 8.3* 8.3* 8.3*  --   MG 1.9 1.9 1.9  --   PHOS 3.6 5.0* 2.4*  --    Estimated Creatinine Clearance: 24.7 mL/min (by C-G formula based on Cr of 2.5).    Recent Labs  12/26/14 0308 12/26/14 0831 12/26/14 1428  GLUCAP 119* 143* 129*    Medications:  Scheduled:  . sodium chloride   Intravenous Once  . antiseptic oral rinse  7 mL Mouth Rinse BID  . dronabinol  2.5 mg Oral BID AC  . feeding supplement (NEPRO CARB STEADY)  237 mL Oral BID BM  . insulin aspart  0-9 Units Subcutaneous Q6H  . mometasone-formoterol  2 puff Inhalation BID  . pantoprazole (PROTONIX) IV  40 mg Intravenous Q12H  . potassium chloride  20 mEq Oral BID  . scopolamine  1 patch Transdermal Q72H  . ziprasidone  10 mg Intramuscular QHS   Infusions:  . Marland KitchenTPN (CLINIMIX-E) Adult 70 mL/hr at 12/25/14 1842  . Marland KitchenTPN (CLINIMIX-E) Adult    . fat emulsion 500 mL (12/26/14 1202)    Insulin Requirements in the past 24 hours:  5 units of Novolog BG ranging 118-151  Current Nutrition:  TPN  5/20% with E at 70 mL/hr, also started on diet but continued poor PO intake- per RD will continue TPN until eating an adequate amount.  Marinol added today for appetite stimulation.   Assessment: Patient is a 69 yo male admitted with mantle cell lymphoma and sepsis.  Pharmacy consulted to manage electrolytes and glucose while receiving TPN.   Electrolytes within normal limits except elevated phos  Plan:  Will not order scheduled insulin at this time.  No electrolyte supplementation required at this time.   Per previous notes, patient previously on TPN formulation with no E. May need to reconsider using a formulation with no electrolytes if HD is not continued.   Will recheck labs in AM.  Pharmacy will continue to follow.   Rexene Edison, PharmD Clinical Pharmacist   12/26/2014 2:47 PM

## 2014-12-26 NOTE — Progress Notes (Signed)
PT came by asking about the patient's status for PT today. Contraindicated to work with patient if hemoglobin is below 7.1, patient's is currently 6.8. Dr. Vianne Bulls stated that patient will have trialysis catheter moved tomorrow and will get a blood transfusion in dialysis tomorrow. Will update PT.

## 2014-12-26 NOTE — Progress Notes (Signed)
Nutrition Follow-up  DOCUMENTATION CODES:   Severe malnutrition in context of acute illness/injury  INTERVENTION:   Coordination of Care: discussed nutritional poc with MD Lindaann Slough; po intake still minimal per documentation and per report. Pt  drinking liquids this AM very well including MightyShakes. Discussed starting calorie count to better assess nutritional intake, MD agrees with this plan. Discussed addition of appetite stimulant such as marinol with MD and Pharmacy; MD agrees with this plan as well. Recommend continuing TPN as tolerated at present as po intake minimal.  PN: continue at current goal rate, 20% lipids infusing today Medical Food Supplement: continue Nepro BID between meals with MightyShakes, Magic Cup on meal trays.   NUTRITION DIAGNOSIS:   Inadequate oral intake related to inability to eat as evidenced by NPO status. Being addressed as pt on TPN, diet advanced, supplements  GOAL:   Patient will meet greater than or equal to 90% of their needs  MONITOR:    (PN, Energy Intake, Anthropometrics, Electrolyte/Renal Profile, Glucose Profile, Digestive System)  ASSESSMENT:    Plan for permcath Friday; appetite remains poor   Diet Order:  DIET - DYS 1 Room service appropriate?: Yes with Assist; Fluid consistency:: Thin .TPN (CLINIMIX-E) Adult .TPN (CLINIMIX-E) Adult   Energy Intake: Pt requires assistance at meal times, per Abby CN, pt took bites of cream of wheat, did not like puree waffle this AM. Drinking liquids well; took 100% Orange Juice, Coffee and MightyShake this AM. Recorded po intake yesterday 0% at dinner; ate magic cup and pudding at lunch.  Digestive System: abdomen soft/distended, BS hypoactive; no N/V per pt report,    Skin:  Reviewed, no issues   Electrolyte and Renal Profile:  Recent Labs Lab 12/24/14 0556 12/25/14 0455 12/26/14 0528  BUN 33* 57* 30*  CREATININE 2.88* 4.12* 2.50*  NA 138 137 143  K 3.6 3.8 4.0  MG 1.9 1.9 1.9  PHOS  3.6 5.0* 2.4*   Glucose Profile:  Recent Labs  12/25/14 2043 12/26/14 0308 12/26/14 0831  GLUCAP 142* 119* 143*   Nutritional Anemia Profile:  CBC Latest Ref Rng 12/25/2014 12/23/2014 12/21/2014  WBC 3.8 - 10.6 K/uL 11.5(H) 11.0(H) 10.4  Hemoglobin 13.0 - 18.0 g/dL 6.8(L) 7.4(L) 8.4(L)  Hematocrit 40.0 - 52.0 % 20.3(L) 22.8(L) 24.9(L)  Platelets 150 - 440 K/uL 167 194 146(L)    Last BM:   documented 7/13  Height:   Ht Readings from Last 1 Encounters:  11/29/14 6' (1.829 m)    Weight:   Wt Readings from Last 1 Encounters:  12/25/14 134 lb 4.2 oz (60.9 kg)    Filed Weights   12/25/14 0509 12/25/14 0919 12/25/14 1320  Weight: 135 lb (61.236 kg) 133 lb 2.5 oz (60.4 kg) 134 lb 4.2 oz (60.9 kg)     Wt Readings from Last 10 Encounters:  12/25/14 134 lb 4.2 oz (60.9 kg)  11/27/14 150 lb 5.7 oz (68.2 kg)  11/25/14 156 lb (70.761 kg)  11/18/14 157 lb 11.2 oz (71.532 kg)  11/05/14 164 lb 0.4 oz (74.4 kg)  10/24/14 165 lb (74.844 kg)  10/24/14 169 lb 5 oz (76.8 kg)  10/22/14 168 lb (76.204 kg)    BMI:  Body mass index is 18.2 kg/(m^2).  Estimated Nutritional Needs:   Kcal:  4332-9518 kcals (BEE 1436, 1.2 AF, 1.2-1.4 IF) using current wt of 62.9 kg  Protein:  76-95 (1.2-1.5 g/kg)   Fluid:  1575-1890 mL (25-30 ml/kg)   EDUCATION NEEDS:   No education needs identified at  this time  HIGH Care Level  [facility] MS, RD, LDN [phone] Pager

## 2014-12-26 NOTE — Progress Notes (Signed)
Subjective:  Alert, able to answer a few questions TPN continued  UOP 1500 cc via foley Appetite remains poor. Calorie count in progress   Objective:  Vital signs in last 24 hours:  Temp:  [98 F (36.7 C)-99.1 F (37.3 C)] 99.1 F (37.3 C) (07/14 0449) Pulse Rate:  [113-122] 113 (07/14 0449) Resp:  [19-24] 19 (07/14 0449) BP: (84-117)/(52-86) 105/54 mmHg (07/14 0449) SpO2:  [99 %] 99 % (07/14 0449) Weight:  [60.9 kg (134 lb 4.2 oz)] 60.9 kg (134 lb 4.2 oz) (07/13 1320)  Weight change: -1.062 kg (-2 lb 5.5 oz) Filed Weights   12/25/14 0509 12/25/14 0919 12/25/14 1320  Weight: 61.236 kg (135 lb) 60.4 kg (133 lb 2.5 oz) 60.9 kg (134 lb 4.2 oz)    Intake/Output: I/O last 3 completed shifts: In: 1930 [P.O.:600] Out: 2136 [Urine:2550]   Intake/Output this shift:     Physical Exam: General: chronically ill appearing  Head: oral mucosa dry  Eyes: Anicteric  Neck: Supple, trachea midline  Lungs:  Clear to auscultation normal effort  Heart: S1S2 no rubs  Abdomen:  Soft, NTND, BS present  Extremities: Trace LE edema  Neurologic: Resting quietly today  Skin: No acute rashes  Access: Right femoral temp HD catheter  foley  Basic Metabolic Panel:  Recent Labs Lab 12/22/14 0443 12/23/14 1046 12/24/14 0556 12/25/14 0455 12/26/14 0528  NA 139 139 138 137 143  K 3.8 3.8 3.6 3.8 4.0  CL 102 102 99* 105 103  CO2 31 27 30 27 31   GLUCOSE 132* 114* 156* 142* 115*  BUN 40* 63* 33* 57* 30*  CREATININE 3.22* 4.58* 2.88* 4.12* 2.50*  CALCIUM 8.4* 8.8* 8.3* 8.3* 8.3*  MG 1.9 1.9 1.9 1.9 1.9  PHOS 3.4 5.2* 3.6 5.0* 2.4*    Liver Function Tests:  Recent Labs Lab 12/21/14 0629 12/23/14 1046  AST 26  --   ALT 23  --   ALKPHOS 216*  --   BILITOT 0.8  --   PROT 6.3*  --   ALBUMIN 2.5* 2.3*   No results for input(s): LIPASE, AMYLASE in the last 168 hours. No results for input(s): AMMONIA in the last 168 hours.  CBC:  Recent Labs Lab 12/20/14 0513 12/21/14 0629  12/23/14 1046 12/25/14 0455  WBC 11.4* 10.4 11.0* 11.5*  NEUTROABS 7.7 8.9*  --   --   HGB 8.3* 8.4* 7.4* 6.8*  HCT 25.2* 24.9* 22.8* 20.3*  MCV 86.2 87.1 88.0 88.5  PLT 104* 146* 194 167    Cardiac Enzymes: No results for input(s): CKTOTAL, CKMB, CKMBINDEX, TROPONINI in the last 168 hours.  BNP: Invalid input(s): POCBNP  CBG:  Recent Labs Lab 12/25/14 1343 12/25/14 1816 12/25/14 2043 12/26/14 0308 12/26/14 0831  GLUCAP 123* 157* 142* 119* 143*    Microbiology: Results for orders placed or performed during the hospital encounter of 11/29/14  MRSA PCR Screening     Status: None   Collection Time: 11/29/14 12:13 PM  Result Value Ref Range Status   MRSA by PCR NEGATIVE NEGATIVE Final    Comment:        The GeneXpert MRSA Assay (FDA approved for NASAL specimens only), is one component of a comprehensive MRSA colonization surveillance program. It is not intended to diagnose MRSA infection nor to guide or monitor treatment for MRSA infections.   Culture, blood (routine x 2)     Status: None   Collection Time: 11/29/14  1:19 PM  Result Value Ref Range Status  Specimen Description BLOOD  Final   Special Requests Immunocompromised  Final   Culture NO GROWTH 5 DAYS  Final   Report Status 12/04/2014 FINAL  Final  Urine culture     Status: None   Collection Time: 11/30/14 10:12 AM  Result Value Ref Range Status   Specimen Description URINE, CATHETERIZED  Final   Special Requests Immunocompromised  Final   Culture NO GROWTH 2 DAYS  Final   Report Status 12/02/2014 FINAL  Final  Culture, blood (routine x 2)     Status: None   Collection Time: 12/06/14  8:08 PM  Result Value Ref Range Status   Specimen Description BLOOD  Final   Special Requests NONE  Final   Culture  Setup Time   Final    GRAM POSITIVE COCCI IN BOTH AEROBIC AND ANAEROBIC BOTTLES CRITICAL RESULT CALLED TO, READ BACK BY AND VERIFIED WITH: JENNIFER BEZARD AT 9622 12/08/14.PMH CONFIRMED BY [facility]     Culture   Final    STAPHYLOCOCCUS AURICULARIS IN BOTH AEROBIC AND ANAEROBIC BOTTLES    Report Status 12/11/2014 FINAL  Final   Organism ID, Bacteria STAPHYLOCOCCUS AURICULARIS  Final      Susceptibility   Staphylococcus auricularis - MIC*    CIPROFLOXACIN >=8 RESISTANT Resistant     ERYTHROMYCIN >=8 RESISTANT Resistant     GENTAMICIN <=0.5 SENSITIVE Sensitive     OXACILLIN >=4 RESISTANT Resistant     TETRACYCLINE <=1 SENSITIVE Sensitive     VANCOMYCIN 1 SENSITIVE Sensitive     CLINDAMYCIN <=0.25 SENSITIVE Sensitive     TRIMETH/SULFA Value in next row Sensitive      SENSITIVE<=20    LEVOFLOXACIN Value in next row Intermediate      INTERMEDIATE4    * STAPHYLOCOCCUS AURICULARIS  Culture, blood (routine x 2)     Status: None   Collection Time: 12/06/14  8:40 PM  Result Value Ref Range Status   Specimen Description BLOOD  Final   Special Requests NONE  Final   Culture NO GROWTH 5 DAYS  Final   Report Status 12/11/2014 FINAL  Final  Culture, blood (routine x 2)     Status: None   Collection Time: 12/08/14 11:40 AM  Result Value Ref Range Status   Specimen Description BLOOD  Final   Special Requests Normal  Final   Culture NO GROWTH 6 DAYS  Final   Report Status 12/14/2014 FINAL  Final  Culture, blood (routine x 2)     Status: None   Collection Time: 12/08/14 11:49 AM  Result Value Ref Range Status   Specimen Description BLOOD  Final   Special Requests Normal  Final   Culture NO GROWTH 6 DAYS  Final   Report Status 12/14/2014 FINAL  Final  C difficile quick scan w PCR reflex ([facility] only)     Status: None   Collection Time: 12/14/14 10:11 AM  Result Value Ref Range Status   C Diff antigen NEGATIVE  Final   C Diff toxin NEGATIVE  Final   C Diff interpretation Negative for C. difficile  Final    Coagulation Studies:  Recent Labs  12/25/14 0455  LABPROT 15.6*  INR 1.22    Urinalysis: No results for input(s): COLORURINE, LABSPEC, PHURINE, GLUCOSEU, HGBUR, BILIRUBINUR,  KETONESUR, PROTEINUR, UROBILINOGEN, NITRITE, LEUKOCYTESUR in the last 72 hours.  Invalid input(s): APPERANCEUR    Imaging: No results found.   Medications:   . Marland KitchenTPN (CLINIMIX-E) Adult 70 mL/hr at 12/25/14 1842  . fat emulsion     .  antiseptic oral rinse  7 mL Mouth Rinse BID  . dronabinol  2.5 mg Oral BID AC  . feeding supplement (NEPRO CARB STEADY)  237 mL Oral BID BM  . insulin aspart  0-9 Units Subcutaneous Q6H  . mometasone-formoterol  2 puff Inhalation BID  . pantoprazole (PROTONIX) IV  40 mg Intravenous Q12H  . potassium chloride  20 mEq Oral BID  . scopolamine  1 patch Transdermal Q72H  . thiamine IV  100 mg Intravenous Daily  . ziprasidone  10 mg Intramuscular QHS   acetaminophen **OR** acetaminophen, albuterol, haloperidol lactate, heparin lock flush, lidocaine (PF), lidocaine-prilocaine, magnesium hydroxide, metoprolol, morphine injection, ondansetron (ZOFRAN) IV, promethazine, sodium chloride, sodium chloride, sodium chloride, ziprasidone  Assessment/ Plan:  67 AAM with progressive mantle cell lymphoma, RICE chemo in 11/2014, hx left sided hydronephrosis and hydroureter. S/p ureteral stent placement   Hospital course complicated by sepsis with Klebsiella oxytoca, hypernatremia  1.  Acute renal failure due to ATN: Pt has undergone multiple dialysis sessions.  Mental status does appear slightly improved after dialysis - monitor UOP now. Dialysis treatments were started July 6 - PermCath placement planned for tomorrow - May consider LTAC as extended recovery is anticipated - Dialysis when necessary  2.  Sepsis with klebsiella oxytoca: completed two weeks for IV ceftazidime.  Appears resolved.    3.  Anemia unspecified D64.9: hgb 6.8.  Consider epogen, but defer this decision to hematology. Consider blood transfusion  4. Hypokalemia:  K 4.0;  Also getting K repletion PRN.  Phos 2.4    LOS: 27 Caleb Francis 7/14/201611:33 AM

## 2014-12-26 NOTE — Progress Notes (Signed)
Speech Language Pathology Treatment: Dysphagia  Patient Details Name: BUBBER ROTHERT MRN: [id number] DOB: 1946/05/17 Today's Date: 12/26/2014 Time: 1600-1650 SLP Time Calculation (min) (ACUTE ONLY): 50 min  Assessment / Plan / Recommendation Clinical Impression  Pt appears to present w/ oral phase dysphagia c/b prolonged oral phase time w/ bolus trial - decreased attention to bolus, poor bolus formation and mastication, prolonged mastication effort and clearing time. Given time, pt eventually mashed and dissolved the bolus trial enough to swallow it as he drank sips of thin liquid via straw. No s/s of aspiration noted w/ liquids; 1 bolus of mech soft. Noted belching post drinking of liquids. Suspect the oral phase deficits are related to his Cognitive status and how he is viewing eating foods; he cannot name a food he would like to eat and when food was brought to the room, he did not want to try it. Rec. A Dys. 2 w/ thin liquids diet w/ aspiration precautions; encouragement at meals and feeding support as nec. Rec. To family to discuss w/ pt certain foods he would eat at home and these could be brought in. ST will continue to f/u w/ toleration of diet and further trials to upgrade to mech soft diet if pt demo. toleration of this new diet consistency.      HPI Other Pertinent Information: Pt is a is a 69 y.o. 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 He was in his normal state of health this morning when he presented for chemotherapy. During his chemotherapy infusion he became acutely confused and also complained of chest pain. Pt became septic w/ colonic Ileus with GI bleed: Patient is no longer neutropenic. Clinically improving per MD. NG tube and  rectal tube has been discontinued and pt placed evaluated for a pureed diet w/ thin liquids. Pt is tolerating this diet but taking less food but drinking all of his drinks.    Pertinent Vitals Pain Assessment: Faces Faces Pain Scale: Hurts even more Pain Location: foot Pain Descriptors / Indicators:  (called out stating "it hurts") Pain Intervention(s): Repositioned (NSG made aware)  SLP Plan  Continue with current plan of care    Recommendations Diet recommendations: Dysphagia 2 (fine chop);Thin liquid Liquids provided via: Cup;Straw Medication Administration: Whole meds with puree (or crushed if nec./able) Supervision: Staff to assist with self feeding;Full supervision/cueing for compensatory strategies Compensations: Slow rate;Small sips/bites;Check for pocketing (check for oral clearing) Postural Changes and/or Swallow Maneuvers: Seated upright 90 degrees              General recommendations:  (Dietician following) Oral Care Recommendations: Oral care BID;Oral care before and after PO;Staff/trained caregiver to provide oral care Follow up Recommendations: Skilled Nursing facility Plan: Continue with current plan of care    GO     [facility] 12/26/2014, 4:57 PM

## 2014-12-26 NOTE — Progress Notes (Signed)
[place] at [facility]                                                                                                                                                                                            Patient Demographics   Caleb Francis, is a 69 y.o. male, DOB - 1946/01/11, QJF:[id number]  Admit date - 11/29/2014   Admitting Physician Aldean Jewett, MD  Outpatient Primary MD for the patient is No PCP Per Patient   Duffy Rhody do patient denies any complaints, by mouth intake continues to be very poor request speech therapy evaluation to upgrade the  Diet.so he can eat. Continue TPN. Review of Systems:   Review of Systems  Constitutional: Negative for fever, chills and malaise/fatigue.  HENT: Negative for ear pain, hearing loss, nosebleeds and tinnitus.   Eyes: Negative for blurred vision, double vision, photophobia and pain.  Respiratory: Negative for cough, hemoptysis and shortness of breath.   Cardiovascular: Negative for chest pain, palpitations, orthopnea and leg swelling.  Gastrointestinal: Negative for heartburn, vomiting, abdominal pain and diarrhea.  Genitourinary: Negative for dysuria, urgency and flank pain.  Musculoskeletal: Negative for myalgias, back pain and neck pain.  Skin: Negative for itching and rash.  Neurological: Negative for dizziness, tremors, sensory change, focal weakness, seizures, weakness and headaches.  Endo/Heme/Allergies: Negative for environmental allergies. Does not bruise/bleed easily.  Psychiatric/Behavioral: Negative for memory loss. The patient is not nervous/anxious and does not have insomnia.      Vitals:   Filed Vitals:   12/25/14 1320 12/25/14 1341 12/25/14 1940 12/26/14 0449  BP: 105/68 103/58 84/52 105/54  Pulse: 117 117 117 113  Temp:  98 F (36.7 C) 99.1 F (37.3 C) 99.1 F (37.3 C)  TempSrc:  Oral Oral Oral  Resp: 19 20 20 19   Height:      Weight: 60.9 kg (134 lb 4.2 oz)     SpO2:   99% 99% 99%    Wt Readings from Last 3 Encounters:  12/25/14 60.9 kg (134 lb 4.2 oz)  11/27/14 68.2 kg (150 lb 5.7 oz)  11/25/14 70.761 kg (156 lb)     Intake/Output Summary (Last 24 hours) at 12/26/14 1223 Last data filed at 12/26/14 0830  Gross per 24 hour  Intake   1650 ml  Output   1086 ml  Net    564 ml    Physical Exam:   GENERAL: critically ill-appearing PERRLA: Pupils equal reacting to light extraocular movements are intact. HEENT head atraumatic normocephalic. ENT unremarkable  NECK: Supple. There is no jugular venous distention. No bruits, no lymphadenopathy, no thyromegaly.  HEART:  Tachycardic 2/6 systolic ejection murmur no rubs LUNGS: nml  breath sounds. ABDOMEN: active bowel sounds in all 4 quadrants without rebound or guarding  EXTREMITIES: No evidence of any cyanosis, clubbing,   NEUROLOGIC: Awake alert and oriented 3. Motor and sensory are grossly intact. Reflexes 2+ SKIN: Moist and warm with no rashes appreciated.  Psych:    no agitation reported overnight.  Radiology Reports 12/10/2014: CT abdomen: Large distended colonic ileus. No obstruction  12/12/2014: KUB   CBC  Recent Labs Lab 12/20/14 0513 12/21/14 0629 12/23/14 1046 12/25/14 0455  WBC 11.4* 10.4 11.0* 11.5*  HGB 8.3* 8.4* 7.4* 6.8*  HCT 25.2* 24.9* 22.8* 20.3*  PLT 104* 146* 194 167  MCV 86.2 87.1 88.0 88.5  MCH 28.5 29.5 28.6 29.8  MCHC 33.1 33.9 32.5 33.7  RDW 18.6* 19.2* 19.0* 18.8*  LYMPHSABS 1.9 0.4*  --   --   MONOABS 1.8* 1.1*  --   --   EOSABS 0.0 0.0  --   --   BASOSABS 0.0 0.0  --   --     Chemistries   Recent Labs Lab 12/21/14 0629 12/22/14 0443 12/23/14 1046 12/24/14 0556 12/25/14 0455 12/26/14 0528  NA 139 139 139 138 137 143  K 3.6 3.8 3.8 3.6 3.8 4.0  CL 106 102 102 99* 105 103  CO2 24 31 27 30 27 31   GLUCOSE 136* 132* 114* 156* 142* 115*  BUN 72* 40* 63* 33* 57* 30*  CREATININE 4.50* 3.22* 4.58* 2.88* 4.12* 2.50*  CALCIUM 8.5* 8.4* 8.8* 8.3* 8.3*  8.3*  MG 1.9 1.9 1.9 1.9 1.9 1.9  AST 26  --   --   --   --   --   ALT 23  --   --   --   --   --   ALKPHOS 216*  --   --   --   --   --   BILITOT 0.8  --   --   --   --   --    ------------------------------------------------------------------------------------------------------------------- No results for input(s): TSH, T4TOTAL, T3FREE, THYROIDAB in the last 72 hours.  Invalid input(s): FREET3 ------------------------------------------------------------------------------------------------------------------  Coagulation profile  Recent Labs Lab 12/25/14 0455  INR 1.22     Assessment & Plan  This is a 69 year old male with mantle cell lymphoma undergoing chemotherapy who was admitted June 15 from oncology clinic with altered mental status and chest pain.   # Acute renal failure due to ATN : Clinically improving Patient's creatinine is improving, had a dialysis yesterday Discussed with nephrology, recommending permanent dialysis catheter placement. Vascular surgery consult is placed, Continue hemodialysis.permanent HD catheter placement by vascular tomorrow Patient's serum creatinine is 4.12 resting non-recovery of renal function.   # Sepsis: With Klebsiella UTI and bacteremia and Staph bacteremia Patient's urine culture and blood cultures were positive for Klebsiella on admission. [place]. Repeat blood cx on 12/06/2014 are growing  STAPHYLOCOCCUS AURICULARIS . Vancomycin given until  12/21/2014 as recommended  # Acute encephalopathy - Metabolic encephalopathy due to sepsis and urinary tract infection/ARF/Hypernatremia along with ICU delirium. - CT head/MRI brain showing no evidence of acute pathology.  Clinically improving  # Chest pain on admission: No EKG changes to suggest ischemia.  Continue to monitor for any further cardiac symptoms  # Mantle cell lymphoma: Patient is on salvage chemotherapy. Patient prognosis is extremely poor secondary to aneurysm or tumor  community difficulties with previous chemotherapy. Cor status is DO NOT RESUSCITATE. I reviewed oncologist  NOTES.  # Panctytopenia: Due to chemotherapy and Mantle cell lymphoma, patient is currently on Neupogen. Patient is s/p 2 units PRBC on 6/25 and 1 unit 12/14/2014 and has received PLT transfusion now stopped Patient needs premedication prior to any transfusions including Benadryl and Tylenol.   # Thrombocytopenia This is due to sepsis/lymphoma and chemo Improving,  # colonic Ileus with GI bleed: Patient is no longer neutropenic. Clinically improving.  discontinued NG tube and rectal tube 12/24/14   TPN, start appetite stimulants with Marinol. # Hypernatremia - resolved  # GI bleed: PPI. High risk for re bleeding due to thrombocytopenia.  # Acute blood loss anmeia over pancytopenia Due to GI losses and lymphoma. Monitor packed RBC with hemodialysis tomorrow #Deconditioning PT is recommending skilled nursing facility   #Disposition Skilled nursing care  Follow-up with case management  Overall Prognosis is very poor   DNR  DVT Prophylaxis   SCD's    Lab Results  Component Value Date   PLT 167 12/25/2014     Poor prognosis  Time spent 35 minutes.   Epifanio Lesches M.D on 12/26/2014 at 12:23 PM  Between 7am to 6pm - Pager - [phone]  After 6pm go to [email or web address] - password [email or web address] [facility]   Office  [phone]

## 2014-12-26 NOTE — Progress Notes (Signed)
Patient has been alert, only oriented to himself. Neuro check is unchanged. TPN still going at 70. Patient has received fat injection today. Encouraging patient to take in PO. Patient is interested in liquids, but has showed no interested in solids. Will continue to encourage and pass on to oncoming RN. Patient has complained of discomfort of left foot. Heels floated and patient has not complained much anymore. Awaiting on pharmacy to send TPN replacement bag.

## 2014-12-26 NOTE — Progress Notes (Signed)
PT Cancellation Note  Patient Details Name: Caleb Francis MRN: [id number] DOB: 02/16/1946   Cancelled Treatment:    Reason Eval/Treat Not Completed: Medical issues which prohibited therapy ([name] Hg 6.8 (PT contraindicated <7.1)).  Nursing reports that MD plans for pt to have a blood transfusion in dialysis tomorrow and also have the trialysis catheter moved tomorrow.  Will re-attempt physical therapy session at a later date/time as medically appropriate and as able.   Leitha Bleak 12/26/2014, 2:10 PM Leitha Bleak, [place]

## 2014-12-26 NOTE — Plan of Care (Signed)
Problem: SLP Dysphagia Goals Goal: Misc Dysphagia Goal Pt demonstrated no overt s/s of aspiration w/ trials of ice chips, thin liquids, purees, and mech soft/solid foods given. Pt exhibited a clear vocal quality b/t all trials w/ no decline in respiratory status noted during/post trials.

## 2014-12-26 NOTE — Progress Notes (Signed)
Speech Therapy Note: Attempted to see pt today for diet consistency upgrade. Son present in room. NSG just finished helping pt w/ the bedpan. Pt positioned upright in bed for po trials. Upon presentation, pt shook his and said "no" to wanting to try bites of chicken salad sandwich or green beans. He did accept 2 sips of OJ. Son attempted to encourage pt as well to take 1-2 bites of the food w/out success; pt continued to shake his head "no" when asked if he could take a bite. Son indicated pt was too sleepy after eating his lunch meal as 12:30(earlier), although pt had his eyes opened when speaking to him giving him stimulation. NSG reported pt is often sleepy during the day.  Will attempt to f/u w/ pt this pm or agreed on a time w/ Son in room to attempt trials to upgrade diet at breakfast meal in the morning. NSG and Dietician updated.

## 2014-12-27 ENCOUNTER — Inpatient Hospital Stay: Payer: Medicare Other

## 2014-12-27 ENCOUNTER — Encounter
Admission: EM | Disposition: A | Discharge status: Skilled Nursing Facility | Payer: Self-pay | Source: Home / Self Care | Attending: Internal Medicine

## 2014-12-27 LAB — CBC WITH DIFFERENTIAL/PLATELET
Basophils Absolute: 0.1 10*3/uL (ref 0–0.1)
Basophils Relative: 1 %
Eosinophils Absolute: 0.2 10*3/uL (ref 0–0.7)
Eosinophils Relative: 2 %
HCT: 22 % — ABNORMAL LOW (ref 40.0–52.0)
Hemoglobin: 7.1 g/dL — ABNORMAL LOW (ref 13.0–18.0)
Lymphocytes Relative: 10 %
Lymphs Abs: 1.3 10*3/uL (ref 1.0–3.6)
MCH: 28.4 pg (ref 26.0–34.0)
MCHC: 32.2 g/dL (ref 32.0–36.0)
MCV: 88.1 fL (ref 80.0–100.0)
Monocytes Absolute: 2.1 10*3/uL — ABNORMAL HIGH (ref 0.2–1.0)
Monocytes Relative: 17 %
Neutro Abs: 8.8 10*3/uL — ABNORMAL HIGH (ref 1.4–6.5)
Neutrophils Relative %: 70 %
Platelets: 158 10*3/uL (ref 150–440)
RBC: 2.5 MIL/uL — ABNORMAL LOW (ref 4.40–5.90)
RDW: 19 % — ABNORMAL HIGH (ref 11.5–14.5)
WBC: 12.4 10*3/uL — ABNORMAL HIGH (ref 3.8–10.6)

## 2014-12-27 LAB — BASIC METABOLIC PANEL
Anion gap: 11 (ref 5–15)
BUN: 44 mg/dL — AB (ref 6–20)
CHLORIDE: 101 mmol/L (ref 101–111)
CO2: 26 mmol/L (ref 22–32)
Calcium: 8.6 mg/dL — ABNORMAL LOW (ref 8.9–10.3)
Creatinine, Ser: 3.68 mg/dL — ABNORMAL HIGH (ref 0.61–1.24)
GFR calc Af Amer: 18 mL/min — ABNORMAL LOW (ref >60)
GFR calc non Af Amer: 16 mL/min — ABNORMAL LOW (ref >60)
Glucose, Bld: 195 mg/dL — ABNORMAL HIGH (ref 65–99)
POTASSIUM: 4.4 mmol/L (ref 3.5–5.1)
Sodium: 138 mmol/L (ref 135–145)

## 2014-12-27 LAB — GLUCOSE, CAPILLARY
GLUCOSE-CAPILLARY: 175 mg/dL — AB (ref 65–99)
GLUCOSE-CAPILLARY: 154 mg/dL — AB (ref 65–99)
Glucose-Capillary: 163 mg/dL — ABNORMAL HIGH (ref 65–99)
Glucose-Capillary: 154 mg/dL — ABNORMAL HIGH (ref 65–99)
Glucose-Capillary: 153 mg/dL — ABNORMAL HIGH (ref 65–99)

## 2014-12-27 LAB — PHOSPHORUS: Phosphorus: 1.9 mg/dL — ABNORMAL LOW (ref 2.5–4.6)

## 2014-12-27 LAB — MAGNESIUM: Magnesium: 1.7 mg/dL (ref 1.7–2.4)

## 2014-12-27 SURGERY — DIALYSIS/PERMA CATHETER INSERTION
Anesthesia: Moderate Sedation

## 2014-12-27 MED ORDER — LORAZEPAM 2 MG/ML IJ SOLN
1.0000 mg | Freq: Once | INTRAMUSCULAR | Status: AC
  Administered 2014-12-27: 1 mg via INTRAVENOUS
  Filled 2014-12-27: qty 1

## 2014-12-27 MED ORDER — FLUCONAZOLE 100MG IVPB
100.0000 mg | INTRAVENOUS | Status: DC
  Administered 2014-12-27 – 2014-12-28 (×2): 100 mg via INTRAVENOUS
  Filled 2014-12-27 (×3): qty 50

## 2014-12-27 MED ORDER — PIPERACILLIN-TAZOBACTAM 3.375 G IVPB
3.3750 g | Freq: Two times a day (BID) | INTRAVENOUS | Status: DC
Start: 2014-12-27 — End: 2014-12-30
  Administered 2014-12-27 – 2014-12-30 (×7): 3.375 g via INTRAVENOUS
  Filled 2014-12-27 (×9): qty 50

## 2014-12-27 MED ORDER — AMIODARONE HCL IN DEXTROSE 360-4.14 MG/200ML-% IV SOLN
30.0000 mg/h | INTRAVENOUS | Status: DC
  Administered 2014-12-27 – 2015-01-03 (×11): 30 mg/h via INTRAVENOUS
  Filled 2014-12-27 (×26): qty 200

## 2014-12-27 MED ORDER — PIPERACILLIN-TAZOBACTAM 3.375 G IVPB: 3.3750 g | Freq: Three times a day (TID) | INTRAVENOUS | Status: DC

## 2014-12-27 MED ORDER — VANCOMYCIN HCL IN DEXTROSE 1-5 GM/200ML-% IV SOLN: 1000.0000 mg | INTRAVENOUS | Status: DC

## 2014-12-27 MED ORDER — SODIUM CHLORIDE 0.9 % IV BOLUS (SEPSIS)
1000.0000 mL | INTRAVENOUS | Status: DC | PRN
  Administered 2014-12-27: 1000 mL via INTRAVENOUS
  Filled 2014-12-27: qty 1000

## 2014-12-27 MED ORDER — VANCOMYCIN HCL 10 G IV SOLR
1500.0000 mg | Freq: Once | INTRAVENOUS | Status: AC
  Administered 2014-12-27: 1500 mg via INTRAVENOUS
  Filled 2014-12-27 (×2): qty 1500

## 2014-12-27 MED ORDER — TRACE MINERALS CR-CU-MN-SE-ZN 10-1000-500-60 MCG/ML IV SOLN
INTRAVENOUS | Status: AC
  Administered 2014-12-27: 18:00:00 via INTRAVENOUS
  Filled 2014-12-27: qty 1680

## 2014-12-27 MED ORDER — CARVEDILOL PHOSPHATE ER 10 MG PO CP24
20.0000 mg | ORAL_CAPSULE | Freq: Every day | ORAL | Status: DC
  Administered 2015-01-02 – 2015-01-19 (×9): 20 mg via ORAL
  Filled 2014-12-27 (×8): qty 2
  Filled 2014-12-27: qty 1
  Filled 2014-12-27 (×5): qty 2

## 2014-12-27 MED ORDER — FLUCONAZOLE IN SODIUM CHLORIDE 200-0.9 MG/100ML-% IV SOLN: 200.0000 mg | INTRAVENOUS | Status: DC

## 2014-12-27 MED ORDER — AMIODARONE HCL IN DEXTROSE 360-4.14 MG/200ML-% IV SOLN
60.0000 mg/h | INTRAVENOUS | Status: AC
  Administered 2014-12-27: 60 mg/h via INTRAVENOUS
  Filled 2014-12-27 (×2): qty 200

## 2014-12-27 MED ORDER — AMIODARONE LOAD VIA INFUSION
150.0000 mg | Freq: Once | INTRAVENOUS | Status: AC
Start: 2014-12-27 — End: 2014-12-27
  Administered 2014-12-27: 150 mg via INTRAVENOUS
  Filled 2014-12-27 (×3): qty 83.34

## 2014-12-27 MED ORDER — VANCOMYCIN HCL IN DEXTROSE 750-5 MG/150ML-% IV SOLN
750.0000 mg | INTRAVENOUS | Status: DC
  Administered 2014-12-28: 750 mg via INTRAVENOUS
  Filled 2014-12-27 (×2): qty 150

## 2014-12-27 MED ORDER — VANCOMYCIN HCL 1000 MG IV SOLR
1000.0000 mg | INTRAVENOUS | Status: DC
  Filled 2014-12-27: qty 1000

## 2014-12-27 NOTE — Progress Notes (Signed)
Pt appears to be restless,  Dr Vianne Bulls came in to assess the pt and ordered to give pt ativan 1mg  IV.  Will reassess that pt in one hour

## 2014-12-27 NOTE — Progress Notes (Signed)
Notified Dr. Marcille Blanco of heart rate in the 130s and 140s. Meds ordered.

## 2014-12-27 NOTE — Progress Notes (Signed)
Patient less interactive. Unable to take PO coreg. Heart rate still in 130s. AMiodorone drip started. Tylenol supp given for fever of 103.2. Ice packs placed. After an hour fever has not changed.

## 2014-12-27 NOTE — Progress Notes (Signed)
Pt resting.

## 2014-12-27 NOTE — Progress Notes (Signed)
PARENTERAL NUTRITION CONSULT NOTE - FOLLOW UP  Pharmacy Consult for Electrolyte supplementation and glucose management Indication:TPN  No Known Allergies  Patient Measurements: Height: 6' (182.9 cm) Weight: 138 lb 3.2 oz (62.687 kg) IBW/kg (Calculated) : 77.6   Vital Signs: Temp: 103.2 F (39.6 C) (07/15 0433) Temp Source: Axillary (07/15 0433) BP: 115/66 mmHg (07/15 0433) Pulse Rate: 139 (07/15 0433) Intake/Output from previous day: 07/14 0701 - 07/15 0700 In: 3614.3 [IV Piggyback:550; TPN:3064.3] Out: 2000 [Urine:2000] Intake/Output from this shift: Total I/O In: 2651.8 [IV Piggyback:550; TPN:2101.8] Out: 1100 [Urine:1100]  Labs:  Recent Labs  12/25/14 0455 12/26/14 1630 12/27/14 0255  WBC 11.5*  --  12.4*  HGB 6.8* 6.9* 7.1*  HCT 20.3* 20.9* 22.0*  PLT 167  --  158  INR 1.22  --   --      Recent Labs  12/25/14 0455 12/26/14 0528 12/26/14 1203 12/27/14 0255  NA 137 143  --  138  K 3.8 4.0  --  4.4  CL 105 103  --  101  CO2 27 31  --  26  GLUCOSE 142* 115*  --  195*  BUN 57* 30*  --  44*  CREATININE 4.12* 2.50*  --  3.68*  LABCREA  --   --  22  --   CALCIUM 8.3* 8.3*  --  8.6*  MG 1.9 1.9  --  1.7  PHOS 5.0* 2.4*  --  1.9*   Estimated Creatinine Clearance: 17.3 mL/min (by C-G formula based on Cr of 3.68).    Recent Labs  12/26/14 1428 12/26/14 2010 12/27/14 0240  GLUCAP 129* 191* 175*    Medications:  Scheduled:  . sodium chloride   Intravenous Once  . amiodarone  150 mg Intravenous Once  . antiseptic oral rinse  7 mL Mouth Rinse BID  . carvedilol  20 mg Oral Daily  .  ceFAZolin (ANCEF) IV  1 g Intravenous On Call  . dronabinol  2.5 mg Oral BID AC  . feeding supplement (NEPRO CARB STEADY)  237 mL Oral BID BM  . insulin aspart  0-9 Units Subcutaneous Q6H  . mometasone-formoterol  2 puff Inhalation BID  . pantoprazole (PROTONIX) IV  40 mg Intravenous Q12H  . scopolamine  1 patch Transdermal Q72H  . ziprasidone  10 mg Intramuscular  QHS   Infusions:  . Marland KitchenTPN (CLINIMIX-E) Adult 70 mL/hr at 12/26/14 1912    Insulin Requirements in the past 24 hours:  4 units of Novolog BG ranging 129-191  Current Nutrition:  TPN 5/20% with E at 70 mL/hr, also started on diet but continued poor PO intake- per RD will continue TPN until eating an adequate amount.  Marinol added for appetite stimulation.   Assessment: Patient is a 69 yo male admitted with mantle cell lymphoma and sepsis.  Pharmacy consulted to manage electrolytes and glucose while receiving TPN.   Electrolytes within normal limits except low phos  Plan:  Will not order scheduled insulin at this time.  No electrolyte supplementation required at this time.  Potassium level trending up.  Will discontinue KCl 20 meq BID supplement and re-evaluate in AM.  Will need to follow up with nephrology concerning need for phosphorus supplement in a patient receiving intermittent HD sessions as needed.       Will recheck labs in AM.  Pharmacy will continue to follow.   Rexene Edison, PharmD Clinical Pharmacist   12/27/2014 6:00 AM

## 2014-12-27 NOTE — Progress Notes (Signed)
Speech Therapy Note: reviewed chart notes; noted changes in pt's status during the night. Pt is currently NPO for procedure today. Will f/u w/ pt's status tomorrow. Rec. Aspiration precautions w/ any po's.

## 2014-12-27 NOTE — Progress Notes (Signed)
[place] at [facility]                                                                                                                                                                                            Patient Demographics   Caleb Francis, is a 69 y.o. male, DOB - Jan 26, 1946, LHT:[id number]  Admit date - 11/29/2014   Admitting Physician Aldean Jewett, MD  Outpatient Primary MD for the patient is No PCP Per Patient   Subjective: Seen today patient is a lethargic. Has had fevers 100-103 Fahrenheit since last night. And also had tachycardia. Started to eat since yesterday there is a question of aspiration. Received Coreg and amiodarone last note secondary to tachycardia. Review of Systems:   Review of Systems  Unable to perform ROS: mental acuity  Constitutional: Negative for fever, chills and malaise/fatigue.  HENT: Negative for ear pain, hearing loss, nosebleeds and tinnitus.   Eyes: Negative for blurred vision, double vision, photophobia and pain.  Respiratory: Negative for cough, hemoptysis and shortness of breath.   Cardiovascular: Negative for chest pain, palpitations, orthopnea and leg swelling.  Gastrointestinal: Negative for heartburn, vomiting, abdominal pain and diarrhea.  Genitourinary: Negative for dysuria, urgency and flank pain.  Musculoskeletal: Negative for myalgias, back pain and neck pain.  Skin: Negative for itching and rash.  Neurological: Negative for dizziness, tremors, sensory change, focal weakness, seizures, weakness and headaches.  Endo/Heme/Allergies: Negative for environmental allergies. Does not bruise/bleed easily.  Psychiatric/Behavioral: Negative for memory loss. The patient is not nervous/anxious and does not have insomnia.      Vitals:   Filed Vitals:   12/27/14 0629 12/27/14 0645 12/27/14 1023 12/27/14 1039  BP: 113/54 98/63  120/52  Pulse: 123 125  130  Temp: 102.2 F (39 C) 99.7 F (37.6 C)  97.5 F (36.4 C) 102 F (38.9 C)  TempSrc: Axillary Axillary Oral Axillary  Resp: 20 20  20   Height:      Weight:      SpO2:  100%      Wt Readings from Last 3 Encounters:  12/27/14 62.687 kg (138 lb 3.2 oz)  11/27/14 68.2 kg (150 lb 5.7 oz)  11/25/14 70.761 kg (156 lb)     Intake/Output Summary (Last 24 hours) at 12/27/14 1053 Last data filed at 12/27/14 0500  Gross per 24 hour  Intake 3614.33 ml  Output   2000 ml  Net 1614.33 ml    Physical Exam:   GENERAL: critically ill-appearing, shaking with chills secondary to high-grade temp. PERRLA: Pupils equal reacting to light extraocular movements are intact. HEENT head atraumatic  normocephalic. ENT unremarkable  NECK: Supple. There is no jugular venous distention. No bruits, no lymphadenopathy, no thyromegaly.  HEART:  Tachycardic 2/6 systolic ejection murmur no rubs LUNGS: nml  breath sounds. ABDOMEN: active bowel sounds in all 4 quadrants without rebound or guarding  EXTREMITIES: No evidence of any cyanosis, clubbing,   NEUROLOGIC: Awake alert and oriented 3. Motor and sensory are grossly intact. Reflexes 2+ SKIN: Moist and warm with no rashes appreciated.  Psych:    no agitation reported overnight.  Radiology Reports 12/10/2014: CT abdomen: Large distended colonic ileus. No obstruction  12/12/2014: KUB   CBC  Recent Labs Lab 12/21/14 0629 12/23/14 1046 12/25/14 0455 12/26/14 1630 12/27/14 0255  WBC 10.4 11.0* 11.5*  --  12.4*  HGB 8.4* 7.4* 6.8* 6.9* 7.1*  HCT 24.9* 22.8* 20.3* 20.9* 22.0*  PLT 146* 194 167  --  158  MCV 87.1 88.0 88.5  --  88.1  MCH 29.5 28.6 29.8  --  28.4  MCHC 33.9 32.5 33.7  --  32.2  RDW 19.2* 19.0* 18.8*  --  19.0*  LYMPHSABS 0.4*  --   --   --  1.3  MONOABS 1.1*  --   --   --  2.1*  EOSABS 0.0  --   --   --  0.2  BASOSABS 0.0  --   --   --  0.1    Chemistries   Recent Labs Lab 12/21/14 0629  12/23/14 1046 12/24/14 0556 12/25/14 0455 12/26/14 0528 12/27/14 0255  NA  139  < > 139 138 137 143 138  K 3.6  < > 3.8 3.6 3.8 4.0 4.4  CL 106  < > 102 99* 105 103 101  CO2 24  < > 27 30 27 31 26   GLUCOSE 136*  < > 114* 156* 142* 115* 195*  BUN 72*  < > 63* 33* 57* 30* 44*  CREATININE 4.50*  < > 4.58* 2.88* 4.12* 2.50* 3.68*  CALCIUM 8.5*  < > 8.8* 8.3* 8.3* 8.3* 8.6*  MG 1.9  < > 1.9 1.9 1.9 1.9 1.7  AST 26  --   --   --   --   --   --   ALT 23  --   --   --   --   --   --   ALKPHOS 216*  --   --   --   --   --   --   BILITOT 0.8  --   --   --   --   --   --   < > = values in this interval not displayed. ------------------------------------------------------------------------------------------------------------------- No results for input(s): TSH, T4TOTAL, T3FREE, THYROIDAB in the last 72 hours.  Invalid input(s): FREET3 ------------------------------------------------------------------------------------------------------------------  Coagulation profile  Recent Labs Lab 12/25/14 0455  INR 1.22     Assessment & Plan  This is a 69 year old male with mantle cell lymphoma undergoing chemotherapy who was admitted June 15 from oncology clinic with altered mental status and chest pain.   # Acute renal failure due to ATN ; with worsening renal failure Before permacath at this time secondary to fever. Requested site rotation for temporary dialysis catheter.  # Sepsis: With Klebsiella UTI and bacteremia and Staph bacteremia Patient's urine culture and blood cultures were positive for Klebsiella on admission. [place]. Repeat blood cx on 12/06/2014 are growing  STAPHYLOCOCCUS AURICULARIS . Vancomycin given until  12/21/2014 as recommended  Now patient has fever and possible sepsis: Continue  to follow blood cultures, urine cultures, chest x-ray. She started vancomycin and Zosyn to cover for aspiration pneumonia and also possible MRSA.  # Acute encephalopathy - Metabolic encephalopathy due to sepsis and urinary tract infection/ARF/Hypernatremia along  with ICU delirium. - CT head/MRI brain showing no evidence of acute pathology.  Has delirium at this time secondary to fever.  # Chest pain on admission: No EKG changes to suggest ischemia.  Continue to monitor for any further cardiac symptoms  # Mantle cell lymphoma: Patient is on salvage chemotherapy. Patient prognosis is extremely poor secondary to aneurysm or tumor community difficulties with previous chemotherapy. Cor status is DO NOT RESUSCITATE. I reviewed oncologist NOTES.  # Panctytopenia: Due to chemotherapy and Mantle cell lymphoma, patient is currently on Neupogen. Patient is s/p 2 units PRBC on 6/25 and 1 unit 12/14/2014 and has received PLT transfusion now stopped Patient needs premedication prior to any transfusions including Benadryl and Tylenol.   # Thrombocytopenia This is due to sepsis/lymphoma and chemo Improving,  # colonic Ileus with GI bleed: Patient is no longer neutropenic. Clinically improving.  discontinued NG tube and rectal tube 12/24/14   TPN, start appetite stimulants with Marinol. # Hypernatremia - resolved  # GI bleed: PPI. High risk for re bleeding due to thrombocytopenia.  # Acute blood loss anmeia over pancytopenia Due to GI losses and lymphoma. Monitor packed RBC with hemodialysis tomorrow #Deconditioning PT is recommending skilled nursing facility    critically ill secondary to sepsis again.  Overall Prognosis is very poor   DNR  DVT Prophylaxis   SCD's    Lab Results  Component Value Date   PLT 158 12/27/2014     Poor prognosis  Time spent 35 minutes.   Epifanio Lesches M.D on 12/27/2014 at 10:53 AM  Between 7am to 6pm - Pager - [phone]  After 6pm go to [email or web address] - password [email or web address] [place] [facility]   Office  [phone]

## 2014-12-27 NOTE — Progress Notes (Signed)
PT Cancellation Note  Patient Details Name: Caleb Francis MRN: [id number] DOB: 11/23/1945   Cancelled Treatment:    Reason Eval/Treat Not Completed: Medical issues which prohibited therapy.  Nursing reports pt is not medically appropriate to participate in physical therapy today (per notes, pt has had fevers since last night and tachycardia).  Will re-attempt PT session at a later date/time as medically appropriate and as able.   Raquel Sarna Eulan Heyward 12/27/2014, 3:00 PM Leitha Bleak, [place]

## 2014-12-27 NOTE — Progress Notes (Signed)
Called to bedside to evaluate tachycardia and shallow respirations.  Ordered po Coreg for additive effect with Toprol given earlier.  Pt refused po. Pt seems lethargic and is not verbal but shook his head no when asked if in any pain.  Still tachycardic to 130's. RR elevated and relatively shallow; no rales or rhonchi. Stat portable CXR ordered.  Also amio bolus ordered for tachycardia.  Added CBC for this morning.  Will order blood cultures if leukocytosis.  (Finished 2 courses of abx, one for Klebsiella in urine and blood, the other for staph auricularis in blood- completed 12/21/14).

## 2014-12-27 NOTE — Progress Notes (Signed)
Medication ativan appears to have settled the pt some.  Pt appears to be sleeping at the present. Will continue to monitor

## 2014-12-27 NOTE — Care Management (Signed)
Patient started spiking fevers during the night.  Spoke with attending regarding goals of treatment.  Discussed the need for attending to call patient daughter- Caleb Francis to discuss goals of treatment - to discuss if family still wishes to aggressively treat all of these sx of possible decline that continue to occur.

## 2014-12-27 NOTE — Progress Notes (Signed)
ANTIBIOTIC CONSULT NOTE - INITIAL  Pharmacy Consult for Vancomycin/Zosyn Indication: rule out sepsis/ Fever/ ?aspiration PNA  No Known Allergies  Patient Measurements: Height: 6' (182.9 cm) Weight: 138 lb 3.2 oz (62.687 kg) IBW/kg (Calculated) : 77.6 Adjusted Body Weight:   Vital Signs: Temp: 102 F (38.9 C) (07/15 1039) Temp Source: Axillary (07/15 1039) BP: 120/52 mmHg (07/15 1039) Pulse Rate: 130 (07/15 1039) Intake/Output from previous day: 07/14 0701 - 07/15 0700 In: 3614.3 [IV Piggyback:550; HDQ:2229.7] Out: 2000 [Urine:2000] Intake/Output from this shift:    Labs:  Recent Labs  12/25/14 0455 12/26/14 0528 12/26/14 1203 12/26/14 1630 12/27/14 0255  WBC 11.5*  --   --   --  12.4*  HGB 6.8*  --   --  6.9* 7.1*  PLT 167  --   --   --  158  LABCREA  --   --  22  --   --   CREATININE 4.12* 2.50*  --   --  3.68*   Estimated Creatinine Clearance: 17.3 mL/min (by C-G formula based on Cr of 3.68).   Microbiology: Recent Results (from the past 720 hour(s))  Culture, blood (single)     Status: None   Collection Time: 11/29/14 11:09 AM  Result Value Ref Range Status   Specimen Description BLOOD  Final   Special Requests NONE  Final   Culture  Setup Time   Final    GRAM NEGATIVE RODS AEROBIC BOTTLE ONLY CRITICAL RESULT CALLED TO, READ BACK BY AND VERIFIED WITH: SANDRA VORBA ON 11/30/14 AT 0825 BY JEF    Culture KLEBSIELLA OXYTOCA AEROBIC BOTTLE ONLY   Final   Report Status 12/04/2014 FINAL  Final   Organism ID, Bacteria KLEBSIELLA OXYTOCA  Final      Susceptibility   Klebsiella oxytoca - MIC*    AMPICILLIN >=32 RESISTANT Resistant     CEFTAZIDIME <=1 SENSITIVE Sensitive     CEFAZOLIN >=64 RESISTANT Resistant     CEFTRIAXONE <=1 SENSITIVE Sensitive     CIPROFLOXACIN <=0.25 SENSITIVE Sensitive     GENTAMICIN <=1 SENSITIVE Sensitive     IMIPENEM <=0.25 SENSITIVE Sensitive     TRIMETH/SULFA <=20 SENSITIVE Sensitive     CEFOXITIN <=4 SENSITIVE Sensitive      PIP/TAZO Value in next row Resistant      RESISTANT128    * KLEBSIELLA OXYTOCA  Urine culture     Status: None   Collection Time: 11/29/14 11:22 AM  Result Value Ref Range Status   Specimen Description URINE, CLEAN CATCH  Final   Special Requests NONE  Final   Culture >=100,000 COLONIES/mL KLEBSIELLA OXYTOCA  Final   Report Status 12/01/2014 FINAL  Final   Organism ID, Bacteria KLEBSIELLA OXYTOCA  Final      Susceptibility   Klebsiella oxytoca - MIC*    AMPICILLIN >=32 RESISTANT Resistant     CEFAZOLIN >=64 RESISTANT Resistant     CEFTRIAXONE 4 SENSITIVE Sensitive     CIPROFLOXACIN <=0.25 SENSITIVE Sensitive     GENTAMICIN <=1 SENSITIVE Sensitive     IMIPENEM <=0.25 SENSITIVE Sensitive     NITROFURANTOIN <=16 SENSITIVE Sensitive     TRIMETH/SULFA <=20 SENSITIVE Sensitive     CEFOXITIN <=4 SENSITIVE Sensitive     * >=100,000 COLONIES/mL KLEBSIELLA OXYTOCA  MRSA PCR Screening     Status: None   Collection Time: 11/29/14 12:13 PM  Result Value Ref Range Status   MRSA by PCR NEGATIVE NEGATIVE Final    Comment:  The GeneXpert MRSA Assay (FDA approved for NASAL specimens only), is one component of a comprehensive MRSA colonization surveillance program. It is not intended to diagnose MRSA infection nor to guide or monitor treatment for MRSA infections.   Culture, blood (routine x 2)     Status: None   Collection Time: 11/29/14  1:19 PM  Result Value Ref Range Status   Specimen Description BLOOD  Final   Special Requests Immunocompromised  Final   Culture NO GROWTH 5 DAYS  Final   Report Status 12/04/2014 FINAL  Final  Urine culture     Status: None   Collection Time: 11/30/14 10:12 AM  Result Value Ref Range Status   Specimen Description URINE, CATHETERIZED  Final   Special Requests Immunocompromised  Final   Culture NO GROWTH 2 DAYS  Final   Report Status 12/02/2014 FINAL  Final  Culture, blood (routine x 2)     Status: None   Collection Time: 12/06/14  8:08 PM   Result Value Ref Range Status   Specimen Description BLOOD  Final   Special Requests NONE  Final   Culture  Setup Time   Final    GRAM POSITIVE COCCI IN BOTH AEROBIC AND ANAEROBIC BOTTLES CRITICAL RESULT CALLED TO, READ BACK BY AND VERIFIED WITH: JENNIFER BEZARD AT 9169 12/08/14.PMH CONFIRMED BY RWW    Culture   Final    STAPHYLOCOCCUS AURICULARIS IN BOTH AEROBIC AND ANAEROBIC BOTTLES    Report Status 12/11/2014 FINAL  Final   Organism ID, Bacteria STAPHYLOCOCCUS AURICULARIS  Final      Susceptibility   Staphylococcus auricularis - MIC*    CIPROFLOXACIN >=8 RESISTANT Resistant     ERYTHROMYCIN >=8 RESISTANT Resistant     GENTAMICIN <=0.5 SENSITIVE Sensitive     OXACILLIN >=4 RESISTANT Resistant     TETRACYCLINE <=1 SENSITIVE Sensitive     VANCOMYCIN 1 SENSITIVE Sensitive     CLINDAMYCIN <=0.25 SENSITIVE Sensitive     TRIMETH/SULFA Value in next row Sensitive      SENSITIVE<=20    LEVOFLOXACIN Value in next row Intermediate      INTERMEDIATE4    * STAPHYLOCOCCUS AURICULARIS  Culture, blood (routine x 2)     Status: None   Collection Time: 12/06/14  8:40 PM  Result Value Ref Range Status   Specimen Description BLOOD  Final   Special Requests NONE  Final   Culture NO GROWTH 5 DAYS  Final   Report Status 12/11/2014 FINAL  Final  Culture, blood (routine x 2)     Status: None   Collection Time: 12/08/14 11:40 AM  Result Value Ref Range Status   Specimen Description BLOOD  Final   Special Requests Normal  Final   Culture NO GROWTH 6 DAYS  Final   Report Status 12/14/2014 FINAL  Final  Culture, blood (routine x 2)     Status: None   Collection Time: 12/08/14 11:49 AM  Result Value Ref Range Status   Specimen Description BLOOD  Final   Special Requests Normal  Final   Culture NO GROWTH 6 DAYS  Final   Report Status 12/14/2014 FINAL  Final  C difficile quick scan w PCR reflex ([facility] only)     Status: None   Collection Time: 12/14/14 10:11 AM  Result Value Ref Range Status    C Diff antigen NEGATIVE  Final   C Diff toxin NEGATIVE  Final   C Diff interpretation Negative for C. difficile  Final    Medical History:  Past Medical History  Diagnosis Date  . Arthritis   . Hypertension   . RA (rheumatoid arthritis)   . Anemia   . Mantle cell lymphoma     Dr Mike Gip  . History of nuclear stress test     a. 12/2013: low risk, no sig ischemia, no EKG changes, no artifact, EF 63%  . Chronic kidney disease   . Sepsis     Dr Ola Spurr    Medications:  Scheduled:  . sodium chloride   Intravenous Once  . antiseptic oral rinse  7 mL Mouth Rinse BID  . carvedilol  20 mg Oral Daily  .  ceFAZolin (ANCEF) IV  1 g Intravenous On Call  . dronabinol  2.5 mg Oral BID AC  . feeding supplement (NEPRO CARB STEADY)  237 mL Oral BID BM  . insulin aspart  0-9 Units Subcutaneous Q6H  . mometasone-formoterol  2 puff Inhalation BID  . pantoprazole (PROTONIX) IV  40 mg Intravenous Q12H  . piperacillin-tazobactam (ZOSYN)  IV  3.375 g Intravenous Q12H  . scopolamine  1 patch Transdermal Q72H  . vancomycin  1,500 mg Intravenous Once  . [START ON 12/28/2014] Vancomycin  750 mg Intravenous Once per day on Tue Thu Sat  . ziprasidone  10 mg Intramuscular QHS   Anti-infectives    Start     Dose/Rate Route Frequency Ordered Stop   12/28/14 1200  vancomycin (VANCOCIN) IVPB 750 mg/150 ml premix     750 mg 150 mL/hr over 60 Minutes Intravenous Once per day on Tue Thu Sat 12/27/14 1210     12/27/14 1400  piperacillin-tazobactam (ZOSYN) IVPB 3.375 g  Status:  Discontinued     3.375 g 12.5 mL/hr over 240 Minutes Intravenous 3 times per day 12/27/14 1049 12/27/14 1210   12/27/14 1300  vancomycin (VANCOCIN) 1,500 mg in sodium chloride 0.9 % 500 mL IVPB     1,500 mg 250 mL/hr over 120 Minutes Intravenous  Once 12/27/14 1210     12/27/14 1230  piperacillin-tazobactam (ZOSYN) IVPB 3.375 g     3.375 g 12.5 mL/hr over 240 Minutes Intravenous Every 12 hours 12/27/14 1210     12/27/14 1100   vancomycin (VANCOCIN) powder 1,000 mg  Status:  Discontinued     1,000 mg Other To Surgery 12/27/14 1049 12/27/14 1119   12/27/14 1100  vancomycin (VANCOCIN) IVPB 1000 mg/200 mL premix  Status:  Discontinued     1,000 mg 200 mL/hr over 60 Minutes Intravenous Every 24 hours 12/27/14 1049 12/27/14 1210   12/27/14 0000  ceFAZolin (ANCEF) IVPB 1 g/50 mL premix    Comments:  Send with pt to OR   1 g 100 mL/hr over 30 Minutes Intravenous On call 12/26/14 2208 12/28/14 0000   12/19/14 1800  vancomycin (VANCOCIN) IVPB 750 mg/150 ml premix     750 mg 150 mL/hr over 60 Minutes Intravenous  Once 12/19/14 1515 12/19/14 1940   12/15/14 0630  vancomycin (VANCOCIN) IVPB 750 mg/150 ml premix     750 mg 150 mL/hr over 60 Minutes Intravenous  Once 12/15/14 0616 12/15/14 0726   12/14/14 1300  vancomycin (VANCOCIN) IVPB 750 mg/150 ml premix  Status:  Discontinued     750 mg 150 mL/hr over 60 Minutes Intravenous Every 48 hours 12/13/14 1107 12/14/14 0604   12/14/14 0603  vancomycin (VANCOCIN) IVPB 750 mg/150 ml premix  Status:  Discontinued     750 mg 150 mL/hr over 60 Minutes Intravenous Daily PRN 12/14/14 0604 12/21/14 1110  12/09/14 0900  erythromycin 250 mg in sodium chloride 0.9 % 100 mL IVPB  Status:  Discontinued     250 mg 100 mL/hr over 60 Minutes Intravenous 3 times per day 12/09/14 0859 12/13/14 1054   12/08/14 1100  metroNIDAZOLE (FLAGYL) IVPB 500 mg  Status:  Discontinued     500 mg 100 mL/hr over 60 Minutes Intravenous Every 8 hours 12/08/14 0958 12/11/14 1048   12/08/14 0900  vancomycin (VANCOCIN) IVPB 750 mg/150 ml premix  Status:  Discontinued     750 mg 150 mL/hr over 60 Minutes Intravenous Every 24 hours 12/07/14 1102 12/12/14 0955   12/07/14 1030  cefTAZidime (FORTAZ) 2 g in dextrose 5 % 50 mL IVPB  Status:  Discontinued     2 g 100 mL/hr over 30 Minutes Intravenous Every 24 hours 12/07/14 1021 12/11/14 1048   12/07/14 0700  vancomycin (VANCOCIN) IVPB 1000 mg/200 mL premix  Status:   Discontinued     1,000 mg 200 mL/hr over 60 Minutes Intravenous Every 24 hours 12/06/14 2225 12/07/14 1102   12/06/14 2200  piperacillin-tazobactam (ZOSYN) IVPB 4.5 g  Status:  Discontinued     4.5 g 200 mL/hr over 30 Minutes Intravenous 3 times per day 12/06/14 1946 12/07/14 1011   12/06/14 2000  vancomycin (VANCOCIN) IVPB 1000 mg/200 mL premix     1,000 mg 200 mL/hr over 60 Minutes Intravenous STAT 12/06/14 1947 12/06/14 2113   12/06/14 2000  cefTRIAXone (ROCEPHIN) 1 g in dextrose 5 % 50 mL IVPB - Premix  Status:  Discontinued     1 g 100 mL/hr over 30 Minutes Intravenous Every 24 hours 12/06/14 1959 12/07/14 1011   12/06/14 1945  piperacillin-tazobactam (ZOSYN) IVPB 3.375 g  Status:  Discontinued     3.375 g 100 mL/hr over 30 Minutes Intravenous 4 times per day 12/06/14 1940 12/06/14 2007   12/06/14 1945  vancomycin (VANCOCIN) IVPB 1000 mg/200 mL premix  Status:  Discontinued     1,000 mg 200 mL/hr over 60 Minutes Intravenous Every 12 hours 12/06/14 1940 12/06/14 2008   12/01/14 0945  piperacillin-tazobactam (ZOSYN) IVPB 3.375 g  Status:  Discontinued     3.375 g 12.5 mL/hr over 240 Minutes Intravenous 3 times per day 12/01/14 0936 12/03/14 1337   11/30/14 1000  cefTRIAXone (ROCEPHIN) 2 g in dextrose 5 % 50 mL IVPB - Premix  Status:  Discontinued    Comments:  Entered per MD progress note   2 g 100 mL/hr over 30 Minutes Intravenous Every 24 hours 11/29/14 2157 12/06/14 1940   11/29/14 1415  cefTRIAXone (ROCEPHIN) 1 g in dextrose 5 % 50 mL IVPB - Premix     1 g 100 mL/hr over 30 Minutes Intravenous  Once 11/29/14 1406 11/29/14 1452   11/29/14 1414  cefTRIAXone (ROCEPHIN) 20 MG/ML IVPB 50 mL    Comments:  MONAR, NELLIE: cabinet override      11/29/14 1414 11/29/14 1424     Assessment: 69 yo male with mantle cell lymphoma on TPN, just started eating per MD note, now with fever, possible sepsis,?aspiration PNA,bacteremia. Patient on Hemodialysis( doesn't appear scheduled).  Goal of  Therapy:  Vancomycin trough level 15-20 mcg/ml  F/U cultures.  Plan:  Measure antibiotic drug levels at steady state  Will give Vancomycin 1500 mg IV x 1 as Loading dose in Hemodialysis patient. Will order vancomycin 750mg  IV as maintenance  Dose- will order Tue,Thur,Sat however patient appears to be receiving Dialysis as needed? Monitor for extra dialysis days  and order extra Vancomycin doses. Plan to order trough prior to 3rd dialysis session. Will change Zosyn to 3.375gm IV Q12h.  Chinita Greenland PharmD Clinical Pharmacist 12/27/2014

## 2014-12-27 NOTE — Progress Notes (Signed)
Nutrition Follow-up  DOCUMENTATION CODES:   Severe malnutrition in context of acute illness/injury  INTERVENTION:   PN: pt has been on TPN since 6/29, >2 weeks. Pt at risk for fungal/bacterial infection. Blood cultures pending;  Noted starting empiric antibiotics. TPN continued at present, continue to assess Coordination of Care: continue calorie count; ultimately, pt would likely benefit from feeding tube placement if medically feasible from GI/surgical standpoint if aggressive interventions are wanted. Will continue to assess  Medical Food supplements: continue Nepro, Art therapist, [employer] Meals/Snacks: cater to pt preferences  NUTRITION DIAGNOSIS:   Inadequate oral intake related to inability to eat as evidenced by NPO status. Being addressed via TPN, calorie count  GOAL:   Patient will meet greater than or equal to 90% of their needs   MONITOR:    (PN, Energy Intake, Anthropometrics, Electrolyte/Renal Profile, Glucose Profile, Digestive System)  REASON FOR ASSESSMENT:   Consult Poor PO (diet advancement)  ASSESSMENT:    Pt now febrile (max Temp 103), tachycardic, elevated WBC; noted orders for blood cultures, order to remove femoral HD cath, TPN infusing via portacath. Noted question of possible aspiration as well per MD note  Diet Order:  .TPN (CLINIMIX-E) Adult Diet NPO time specified Except for: Sips with Meds .TPN (CLINIMIX-E) Adult  Energy Intake:  NPO this AM for procedure, remains NPO at present. Calorie count in progress. Although no documentation of lunch and dinner meal yesterday via calorie count. SLP did assist pt at dinner last night, pt drank liquids well but took bite of grilled cheese and held in mouth. Pt ate some of puree diet yesterday per report from Son but nothing documented. When SLP tried to evaluate pt around lunchtime yesterday, pt did not want anything.  Skin:  Reviewed, no issues  Electrolyte and Renal Profile:  Recent Labs Lab  12/25/14 0455 12/26/14 0528 12/27/14 0255  BUN 57* 30* 44*  CREATININE 4.12* 2.50* 3.68*  NA 137 143 138  K 3.8 4.0 4.4  MG 1.9 1.9 1.7  PHOS 5.0* 2.4* 1.9*   Glucose Profile:  Recent Labs  12/27/14 0240 12/27/14 0726 12/27/14 1334  GLUCAP 175* 163* 154*   Nutritional Anemia Profile:  CBC Latest Ref Rng 12/27/2014 12/26/2014 12/25/2014  WBC 3.8 - 10.6 K/uL 12.4(H) - 11.5(H)  Hemoglobin 13.0 - 18.0 g/dL 7.1(L) 6.9(L) 6.8(L)  Hematocrit 40.0 - 52.0 % 22.0(L) 20.9(L) 20.3(L)  Platelets 150 - 440 K/uL 158 - 167    Protein Profile:  Recent Labs Lab 12/21/14 0629 12/23/14 1046  ALBUMIN 2.5* 2.3*    Height:   Ht Readings from Last 1 Encounters:  11/29/14 6' (1.829 m)    Weight:   Wt Readings from Last 1 Encounters:  12/27/14 138 lb 3.2 oz (62.687 kg)    Filed Weights   12/25/14 1320 12/26/14 1506 12/27/14 0433  Weight: 134 lb 4.2 oz (60.9 kg) 144 lb 4.8 oz (65.454 kg) 138 lb 3.2 oz (62.687 kg)     BMI:  Body mass index is 18.74 kg/(m^2).  Estimated Nutritional Needs:   Kcal:  3845-3646 kcals (BEE 1436, 1.2 AF, 1.2-1.4 IF) using current wt of 62.9 kg  Protein:  76-95 (1.2-1.5 g/kg)   Fluid:  1575-1890 mL (25-30 ml/kg)   EDUCATION NEEDS:   No education needs identified at this time  [place], RD, LDN [phone] Pager

## 2014-12-27 NOTE — Progress Notes (Signed)
Subjective:  Patient is not answering questions today. Appears to be confused which is a change from yesterday TPN continued  UOP 2000 cc via foley Appetite remains poor. Calorie count in progress Spiked fever last night. T Max 103.2   Objective:  Vital signs in last 24 hours:  Temp:  [97.8 F (36.6 C)-103.2 F (39.6 C)] 99.7 F (37.6 C) (07/15 0645) Pulse Rate:  [112-139] 125 (07/15 0645) Resp:  [18-24] 20 (07/15 0645) BP: (98-115)/(54-66) 98/63 mmHg (07/15 0645) SpO2:  [100 %] 100 % (07/15 0645) Weight:  [62.687 kg (138 lb 3.2 oz)-65.454 kg (144 lb 4.8 oz)] 62.687 kg (138 lb 3.2 oz) (07/15 0433)  Weight change: 5.054 kg (11 lb 2.3 oz) Filed Weights   12/25/14 1320 12/26/14 1506 12/27/14 0433  Weight: 60.9 kg (134 lb 4.2 oz) 65.454 kg (144 lb 4.8 oz) 62.687 kg (138 lb 3.2 oz)    Intake/Output: I/O last 3 completed shifts: In: 4434.3 [P.O.:120; IV Piggyback:550] Out: 2600 [Urine:2600]   Intake/Output this shift:     Physical Exam: General: chronically ill appearing  Head: oral mucosa dry  Eyes: Anicteric  Neck: Supple, trachea midline  Lungs:  Clear to auscultation normal effort  Heart: S1S2 no rubs  Abdomen:  Soft, NTND, BS present  Extremities: Trace LE edema  Neurologic: Confused, not following commands today  Skin: No acute rashes  Access: Right femoral temp HD catheter  foley  Basic Metabolic Panel:  Recent Labs Lab 12/23/14 1046 12/24/14 0556 12/25/14 0455 12/26/14 0528 12/27/14 0255  NA 139 138 137 143 138  K 3.8 3.6 3.8 4.0 4.4  CL 102 99* 105 103 101  CO2 27 30 27 31 26   GLUCOSE 114* 156* 142* 115* 195*  BUN 63* 33* 57* 30* 44*  CREATININE 4.58* 2.88* 4.12* 2.50* 3.68*  CALCIUM 8.8* 8.3* 8.3* 8.3* 8.6*  MG 1.9 1.9 1.9 1.9 1.7  PHOS 5.2* 3.6 5.0* 2.4* 1.9*    Liver Function Tests:  Recent Labs Lab 12/21/14 0629 12/23/14 1046  AST 26  --   ALT 23  --   ALKPHOS 216*  --   BILITOT 0.8  --   PROT 6.3*  --   ALBUMIN 2.5* 2.3*   No  results for input(s): LIPASE, AMYLASE in the last 168 hours. No results for input(s): AMMONIA in the last 168 hours.  CBC:  Recent Labs Lab 12/21/14 0629 12/23/14 1046 12/25/14 0455 12/26/14 1630 12/27/14 0255  WBC 10.4 11.0* 11.5*  --  12.4*  NEUTROABS 8.9*  --   --   --  8.8*  HGB 8.4* 7.4* 6.8* 6.9* 7.1*  HCT 24.9* 22.8* 20.3* 20.9* 22.0*  MCV 87.1 88.0 88.5  --  88.1  PLT 146* 194 167  --  158    Cardiac Enzymes: No results for input(s): CKTOTAL, CKMB, CKMBINDEX, TROPONINI in the last 168 hours.  BNP: Invalid input(s): POCBNP  CBG:  Recent Labs Lab 12/26/14 0831 12/26/14 1428 12/26/14 2010 12/27/14 0240 12/27/14 0726  GLUCAP 143* 129* 191* 175* 163*    Microbiology: Results for orders placed or performed during the hospital encounter of 11/29/14  MRSA PCR Screening     Status: None   Collection Time: 11/29/14 12:13 PM  Result Value Ref Range Status   MRSA by PCR NEGATIVE NEGATIVE Final    Comment:        The GeneXpert MRSA Assay (FDA approved for NASAL specimens only), is one component of a comprehensive MRSA colonization surveillance program. It is  not intended to diagnose MRSA infection nor to guide or monitor treatment for MRSA infections.   Culture, blood (routine x 2)     Status: None   Collection Time: 11/29/14  1:19 PM  Result Value Ref Range Status   Specimen Description BLOOD  Final   Special Requests Immunocompromised  Final   Culture NO GROWTH 5 DAYS  Final   Report Status 12/04/2014 FINAL  Final  Urine culture     Status: None   Collection Time: 11/30/14 10:12 AM  Result Value Ref Range Status   Specimen Description URINE, CATHETERIZED  Final   Special Requests Immunocompromised  Final   Culture NO GROWTH 2 DAYS  Final   Report Status 12/02/2014 FINAL  Final  Culture, blood (routine x 2)     Status: None   Collection Time: 12/06/14  8:08 PM  Result Value Ref Range Status   Specimen Description BLOOD  Final   Special Requests  NONE  Final   Culture  Setup Time   Final    GRAM POSITIVE COCCI IN BOTH AEROBIC AND ANAEROBIC BOTTLES CRITICAL RESULT CALLED TO, READ BACK BY AND VERIFIED WITH: JENNIFER BEZARD AT 7408 12/08/14.PMH CONFIRMED BY [facility]    Culture   Final    STAPHYLOCOCCUS AURICULARIS IN BOTH AEROBIC AND ANAEROBIC BOTTLES    Report Status 12/11/2014 FINAL  Final   Organism ID, Bacteria STAPHYLOCOCCUS AURICULARIS  Final      Susceptibility   Staphylococcus auricularis - MIC*    CIPROFLOXACIN >=8 RESISTANT Resistant     ERYTHROMYCIN >=8 RESISTANT Resistant     GENTAMICIN <=0.5 SENSITIVE Sensitive     OXACILLIN >=4 RESISTANT Resistant     TETRACYCLINE <=1 SENSITIVE Sensitive     VANCOMYCIN 1 SENSITIVE Sensitive     CLINDAMYCIN <=0.25 SENSITIVE Sensitive     TRIMETH/SULFA Value in next row Sensitive      SENSITIVE<=20    LEVOFLOXACIN Value in next row Intermediate      INTERMEDIATE4    * STAPHYLOCOCCUS AURICULARIS  Culture, blood (routine x 2)     Status: None   Collection Time: 12/06/14  8:40 PM  Result Value Ref Range Status   Specimen Description BLOOD  Final   Special Requests NONE  Final   Culture NO GROWTH 5 DAYS  Final   Report Status 12/11/2014 FINAL  Final  Culture, blood (routine x 2)     Status: None   Collection Time: 12/08/14 11:40 AM  Result Value Ref Range Status   Specimen Description BLOOD  Final   Special Requests Normal  Final   Culture NO GROWTH 6 DAYS  Final   Report Status 12/14/2014 FINAL  Final  Culture, blood (routine x 2)     Status: None   Collection Time: 12/08/14 11:49 AM  Result Value Ref Range Status   Specimen Description BLOOD  Final   Special Requests Normal  Final   Culture NO GROWTH 6 DAYS  Final   Report Status 12/14/2014 FINAL  Final  C difficile quick scan w PCR reflex ([facility] only)     Status: None   Collection Time: 12/14/14 10:11 AM  Result Value Ref Range Status   C Diff antigen NEGATIVE  Final   C Diff toxin NEGATIVE  Final   C Diff interpretation  Negative for C. difficile  Final    Coagulation Studies:  Recent Labs  12/25/14 0455  LABPROT 15.6*  INR 1.22    Urinalysis: No results for input(s): COLORURINE, LABSPEC,  PHURINE, GLUCOSEU, HGBUR, BILIRUBINUR, KETONESUR, PROTEINUR, UROBILINOGEN, NITRITE, LEUKOCYTESUR in the last 72 hours.  Invalid input(s): APPERANCEUR    Imaging: Dg Chest Port 1 View  12/27/2014   CLINICAL DATA:  Shortness of breath starting last night. Former smoker. Current lymphoma. Renal failure.  EXAM: PORTABLE CHEST - 1 VIEW  COMPARISON:  12/06/2014  FINDINGS: Shallow inspiration. Cardiac enlargement without vascular congestion. Power port type central venous catheter with tip over the low SVC region. No pneumothorax. No focal infiltration or consolidation. No blunting of costophrenic angles. No pneumothorax. Tortuous and calcified aorta.  IMPRESSION: Shallow inspiration. Cardiac enlargement. No evidence of active infiltration.   Electronically Signed   By: Lucienne Capers M.D.   On: 12/27/2014 05:33     Medications:   . Marland KitchenTPN (CLINIMIX-E) Adult 70 mL/hr at 12/26/14 1912  . amiodarone 60 mg/hr (12/27/14 0610)  . amiodarone     . sodium chloride   Intravenous Once  . antiseptic oral rinse  7 mL Mouth Rinse BID  . carvedilol  20 mg Oral Daily  .  ceFAZolin (ANCEF) IV  1 g Intravenous On Call  . dronabinol  2.5 mg Oral BID AC  . feeding supplement (NEPRO CARB STEADY)  237 mL Oral BID BM  . insulin aspart  0-9 Units Subcutaneous Q6H  . mometasone-formoterol  2 puff Inhalation BID  . pantoprazole (PROTONIX) IV  40 mg Intravenous Q12H  . scopolamine  1 patch Transdermal Q72H  . ziprasidone  10 mg Intramuscular QHS   acetaminophen **OR** acetaminophen, albuterol, haloperidol lactate, heparin lock flush, lidocaine (PF), lidocaine-prilocaine, magnesium hydroxide, metoprolol, morphine injection, ondansetron (ZOFRAN) IV, promethazine, sodium chloride, sodium chloride, sodium chloride, sodium  chloride  Assessment/ Plan:  52 AAM with progressive mantle cell lymphoma, RICE chemo in 11/2014, hx left sided hydronephrosis and hydroureter. S/p ureteral stent placement   Hospital course complicated by sepsis with Klebsiella oxytoca, hypernatremia  1.  Acute renal failure due to ATN: Pt has undergone multiple dialysis sessions.  Mental status does appear slightly improved after dialysis - monitor UOP now. Dialysis treatments were started July 6 - PermCath placement to be deferred since patient spiked a fever.  - Requested site rotation for temp dialysis catheter - May consider LTAC as extended recovery is anticipated - Dialysis when necessary - UOP 2000 cc but estimated CrCl  < 10   2.  Sepsis with klebsiella oxytoca: completed two weeks for IV ceftazidime.  Appears resolved.  - With new onset of fever, he may get septic again - Consider blood cultures, urine cultures, chest x-ray and empiric antibiotics   3.  Anemia unspecified D64.9: hgb 7.1.  Consider epogen, but defer this decision to hematology. Consider blood transfusion  4. Hypokalemia:  K 4.4  Also getting K repletion PRN.  Phos 1.9    LOS: 28 Lorie Cleckley 7/15/20168:46 AM

## 2014-12-27 NOTE — Progress Notes (Signed)
ALert and confused. Heart rate has been high. Dr. Marcille Blanco notified. Coreg and NS bolus ordered and given. Will continue to monitor. ON TPN. For [facility] cath placement today.

## 2014-12-27 NOTE — Progress Notes (Signed)
Assessed pt and pt found with temp and dr notified and tylenol supp will be given.  Pt will have temp monitored freq throughout the shift.  Pt appears to be uncomfortable and very rigid.  Pt repositioned and bathed in bed and pt relaxed and appears to be sleeping.

## 2014-12-27 NOTE — Care Management Important Message (Signed)
Important Message  Patient Details  Name: Caleb Francis MRN: [id number] Date of Birth: 06-27-1945   Medicare Important Message Given:  Yes-fourth notification given    [name] 12/27/2014, 10:41 AM

## 2014-12-28 DIAGNOSIS — L8990 Pressure ulcer of unspecified site, unspecified stage: Secondary | ICD-10-CM | POA: Insufficient documentation

## 2014-12-28 LAB — GLUCOSE, CAPILLARY
GLUCOSE-CAPILLARY: 159 mg/dL — AB (ref 65–99)
GLUCOSE-CAPILLARY: 162 mg/dL — AB (ref 65–99)
Glucose-Capillary: 164 mg/dL — ABNORMAL HIGH (ref 65–99)
Glucose-Capillary: 177 mg/dL — ABNORMAL HIGH (ref 65–99)

## 2014-12-28 LAB — BASIC METABOLIC PANEL
ANION GAP: 11 (ref 5–15)
BUN: 75 mg/dL — AB (ref 6–20)
CO2: 24 mmol/L (ref 22–32)
CREATININE: 5.33 mg/dL — AB (ref 0.61–1.24)
Calcium: 7.7 mg/dL — ABNORMAL LOW (ref 8.9–10.3)
Chloride: 102 mmol/L (ref 101–111)
GFR, EST AFRICAN AMERICAN: 12 mL/min — AB (ref >60)
GFR, EST NON AFRICAN AMERICAN: 10 mL/min — AB (ref >60)
Glucose, Bld: 290 mg/dL — ABNORMAL HIGH (ref 65–99)
Potassium: 4 mmol/L (ref 3.5–5.1)
Sodium: 137 mmol/L (ref 135–145)

## 2014-12-28 LAB — ALBUMIN: Albumin: 1.9 g/dL — ABNORMAL LOW (ref 3.5–5.0)

## 2014-12-28 LAB — MAGNESIUM: Magnesium: 1.9 mg/dL (ref 1.7–2.4)

## 2014-12-28 LAB — CBC
HCT: 19.4 % — ABNORMAL LOW (ref 40.0–52.0)
Hemoglobin: 6.3 g/dL — ABNORMAL LOW (ref 13.0–18.0)
MCH: 28.6 pg (ref 26.0–34.0)
MCHC: 32.6 g/dL (ref 32.0–36.0)
MCV: 87.8 fL (ref 80.0–100.0)
PLATELETS: 106 10*3/uL — AB (ref 150–440)
RBC: 2.21 MIL/uL — AB (ref 4.40–5.90)
RDW: 18.9 % — AB (ref 11.5–14.5)
WBC: 11.5 10*3/uL — AB (ref 3.8–10.6)

## 2014-12-28 LAB — PHOSPHORUS: Phosphorus: 6.2 mg/dL — ABNORMAL HIGH (ref 2.5–4.6)

## 2014-12-28 LAB — PREPARE RBC (CROSSMATCH)

## 2014-12-28 MED ORDER — TRACE MINERALS CR-CU-MN-SE-ZN 10-1000-500-60 MCG/ML IV SOLN
INTRAVENOUS | Status: DC
  Administered 2014-12-28: 18:00:00 via INTRAVENOUS
  Filled 2014-12-28: qty 1680

## 2014-12-28 MED ORDER — SODIUM CHLORIDE 0.9 % IV SOLN
Freq: Once | INTRAVENOUS | Status: AC
  Administered 2014-12-28: 11:00:00 via INTRAVENOUS

## 2014-12-28 NOTE — Progress Notes (Signed)
PARENTERAL NUTRITION CONSULT NOTE - FOLLOW UP  Pharmacy Consult for Electrolyte supplementation and glucose management Indication:TPN  No Known Allergies  Patient Measurements: Height: 6' (182.9 cm) Weight: 135 lb 14.4 oz (61.644 kg) IBW/kg (Calculated) : 77.6   Vital Signs: Temp: 100.2 F (37.9 C) (07/16 0315) Temp Source: Oral (07/16 0315) BP: 103/63 mmHg (07/16 0315) Pulse Rate: 127 (07/16 0315) Intake/Output from previous day: 07/15 0701 - 07/16 0700 In: 2721 [I.V.:2011; IV Piggyback:50; TPN:660] Out: 2200 [Urine:2200] Intake/Output from this shift:    Labs:  Recent Labs  12/26/14 1630 12/27/14 0255 12/28/14 0531  WBC  --  12.4* 11.5*  HGB 6.9* 7.1* 6.3*  HCT 20.9* 22.0* 19.4*  PLT  --  158 106*     Recent Labs  12/26/14 0528 12/26/14 1203 12/27/14 0255 12/28/14 0531  NA 143  --  138 137  K 4.0  --  4.4 4.0  CL 103  --  101 102  CO2 31  --  26 24  GLUCOSE 115*  --  195* 290*  BUN 30*  --  44* 75*  CREATININE 2.50*  --  3.68* 5.33*  LABCREA  --  22  --   --   CALCIUM 8.3*  --  8.6* 7.7*  MG 1.9  --  1.7 1.9  PHOS 2.4*  --  1.9* 6.2*  ALBUMIN  --   --   --  1.9*   Estimated Creatinine Clearance: 11.7 mL/min (by C-G formula based on Cr of 5.33).    Recent Labs  12/27/14 2013 12/28/14 0145 12/28/14 0730  GLUCAP 153* 164* 159*    Medications:  Scheduled:  . sodium chloride   Intravenous Once  . antiseptic oral rinse  7 mL Mouth Rinse BID  . carvedilol  20 mg Oral Daily  . dronabinol  2.5 mg Oral BID AC  . feeding supplement (NEPRO CARB STEADY)  237 mL Oral BID BM  . fluconazole (DIFLUCAN) IV  100 mg Intravenous Q24H  . insulin aspart  0-9 Units Subcutaneous Q6H  . mometasone-formoterol  2 puff Inhalation BID  . pantoprazole (PROTONIX) IV  40 mg Intravenous Q12H  . piperacillin-tazobactam (ZOSYN)  IV  3.375 g Intravenous Q12H  . scopolamine  1 patch Transdermal Q72H  . Vancomycin  750 mg Intravenous Once per day on Tue Thu Sat  .  ziprasidone  10 mg Intramuscular QHS   Infusions:  . Marland KitchenTPN (CLINIMIX-E) Adult 70 mL/hr at 12/28/14 0303  . amiodarone 30 mg/hr (12/28/14 0303)    Insulin Requirements in the past 24 hours:  4 units of Novolog BG ranging 129-191  Current Nutrition:  TPN 5/20% with E at 70 mL/hr, also started on diet but continued poor PO intake- per RD will continue TPN until eating an adequate amount.  Marinol added for appetite stimulation.   Assessment: Patient is a 69 yo male admitted with mantle cell lymphoma and sepsis.  Pharmacy consulted to manage electrolytes and glucose while receiving TPN.   Electrolytes within normal limits except low phos  Plan:  Will not order scheduled insulin at this time.  No electrolyte supplementation required at this time. Phosphorus elevated planned HD for today.  Patient receiving intermittent hemodialysis.   Will recheck labs in AM.  Pharmacy will continue to follow.  Larene Beach, PharmD Clinical Pharmacist   12/28/2014 8:48 AM

## 2014-12-28 NOTE — Progress Notes (Signed)
Nutrition Follow-up  DOCUMENTATION CODES:   Severe malnutrition in context of acute illness/injury  INTERVENTION:   PN: TPN to continue at present Coordination of care: Spoke with RN, Juliann Pulse and pt not alert enough to take po intake (diet order NPO), unable to complete calorie count Ultimately, pt would benefit from feeding tube placement if medically feasible from GI/surgical standpoint if aggressive interventions are wanted.   NUTRITION DIAGNOSIS:   Inadequate oral intake related to inability to eat as evidenced by NPO status. Being addressed with TPN    GOAL:   Patient will meet greater than or equal to 90% of their needs    MONITOR:    (PN, Energy Intake, Anthropometrics, Electrolyte/Renal Profile, Glucose Profile, Digestive System)  REASON FOR ASSESSMENT:   Consult Poor PO (diet advancement)  ASSESSMENT:   Pt with temp 100-103, femoral HD cath removed, TPN infusing via portacath.  Possible aspiration per MD note  Diet Order:  Diet NPO time specified Except for: Sips with Meds .TPN (CLINIMIX-E) Adult .TPN (CLINIMIX-E) Adult  Skin:  Noted stage I and II pressure ulcers  Medications: reviewed  Electrolyte/Renal Profile and Glucose Profile:   Recent Labs Lab 12/26/14 0528 12/27/14 0255 12/28/14 0531  NA 143 138 137  K 4.0 4.4 4.0  CL 103 101 102  CO2 31 26 24   BUN 30* 44* 75*  CREATININE 2.50* 3.68* 5.33*  CALCIUM 8.3* 8.6* 7.7*  MG 1.9 1.7 1.9  PHOS 2.4* 1.9* 6.2*  GLUCOSE 115* 195* 290*   Protein Profile:  Recent Labs Lab 12/23/14 1046 12/28/14 0531  ALBUMIN 2.3* 1.9*      Weight Trend since Admission: Filed Weights   12/26/14 1506 12/27/14 0433 12/28/14 0315  Weight: 144 lb 4.8 oz (65.454 kg) 138 lb 3.2 oz (62.687 kg) 135 lb 14.4 oz (61.644 kg)      Last BM:   7/15  Height:   Ht Readings from Last 1 Encounters:  11/29/14 6' (1.829 m)    Weight:   Wt Readings from Last 1 Encounters:  12/28/14 135 lb 14.4 oz (61.644 kg)     Ideal Body Weight:     Wt Readings from Last 10 Encounters:  12/28/14 135 lb 14.4 oz (61.644 kg)  11/27/14 150 lb 5.7 oz (68.2 kg)  11/25/14 156 lb (70.761 kg)  11/18/14 157 lb 11.2 oz (71.532 kg)  11/05/14 164 lb 0.4 oz (74.4 kg)  10/24/14 165 lb (74.844 kg)  10/24/14 169 lb 5 oz (76.8 kg)  10/22/14 168 lb (76.204 kg)    BMI:  Body mass index is 18.43 kg/(m^2).  Estimated Nutritional Needs:   Kcal:  3875-6433 kcals (BEE 1436, 1.2 AF, 1.2-1.4 IF) using current wt of 62.9 kg  Protein:  76-95 (1.2-1.5 g/kg)   Fluid:  1575-1890 mL (25-30 ml/kg)   EDUCATION NEEDS:   No education needs identified at this time  [place]. Zenia Resides, [place], [place] (pager)

## 2014-12-28 NOTE — Plan of Care (Signed)
Problem: Phase I Progression Outcomes Goal: Voiding-avoid urinary catheter unless indicated Outcome: Not Progressing Foley cath remains in place, cath care done.  Problem: Problem: Skin/Wound Progression Goal: OTHER SKIN/WOUND GOAL(S) Outcome: Not Progressing PU noted to left buttocks and old healing PU noted to mid sacrum. Foam pad applied post cleaning.

## 2014-12-28 NOTE — Progress Notes (Addendum)
[facility]                                                                                                                                                                                            Patient Demographics   Caleb Francis, is a 69 y.o. male, DOB - 03-27-1946, RKY:[id number]  Admit date - 11/29/2014   Admitting Physician Aldean Jewett, MD  Outpatient Primary MD for the patient is No PCP Per Patient   Subjective:  patient is still lethargic and not feeling well. No family members at bedside, daughter is in [place]. Has had fevers 100-103 Fahrenheit  And also had tachycardia. Started to feed by mouth 2 days ago, there is a question of aspiration.   Review of Systems:   Review of Systems  Unable to perform ROS: mental acuity  Constitutional: Negative for fever, chills and malaise/fatigue.  HENT: Negative for ear pain, hearing loss, nosebleeds and tinnitus.   Eyes: Negative for blurred vision, double vision, photophobia and pain.  Respiratory: Negative for cough, hemoptysis and shortness of breath.   Cardiovascular: Negative for chest pain, palpitations, orthopnea and leg swelling.  Gastrointestinal: Negative for heartburn, vomiting, abdominal pain and diarrhea.  Genitourinary: Negative for dysuria, urgency and flank pain.  Musculoskeletal: Negative for myalgias, back pain and neck pain.  Skin: Negative for itching and rash.  Neurological: Negative for dizziness, tremors, sensory change, focal weakness, seizures, weakness and headaches.  Endo/Heme/Allergies: Negative for environmental allergies. Does not bruise/bleed easily.  Psychiatric/Behavioral: Negative for memory loss. The patient is not nervous/anxious and does not have insomnia.      Vitals:   Filed Vitals:   12/27/14 1213 12/27/14 1935 12/27/14 1940 12/28/14 0315  BP: 91/51 104/58  103/63  Pulse: 123 128 126 127  Temp: 103.1 F (39.5 C) 100 F (37.8 C)   100.2 F (37.9 C)  TempSrc: Axillary Oral  Oral  Resp: 20 19    Height:      Weight:    61.644 kg (135 lb 14.4 oz)  SpO2:  100%  100%    Wt Readings from Last 3 Encounters:  12/28/14 61.644 kg (135 lb 14.4 oz)  11/27/14 68.2 kg (150 lb 5.7 oz)  11/25/14 70.761 kg (156 lb)     Intake/Output Summary (Last 24 hours) at 12/28/14 1214 Last data filed at 12/28/14 0740  Gross per 24 hour  Intake   2721 ml  Output   2350 ml  Net    371 ml    Physical Exam:   GENERAL: critically ill-appearing, shaking with chills secondary to  Temp.  lethargic  PERRLA: Pupils equal reacting to light extraocular movements are intact. HEENT head atraumatic normocephalic. ENT unremarkable  NECK: Supple. There is no jugular venous distention. No bruits, no lymphadenopathy, no thyromegaly.  HEART:  Tachycardic 2/6 systolic ejection murmur no rubs LUNGS: nml  breath sounds. ABDOMEN: active bowel sounds in all 4 quadrants without rebound or guarding  EXTREMITIES: No evidence of any cyanosis, clubbing,   NEUROLOGIC: Lethargic  Motor and sensory are grossly intact. Reflexes 2+ SKIN: Moist and warm with no rashes appreciated.  Psych:    no agitation reported overnight.  Radiology Reports 12/10/2014: CT abdomen: Large distended colonic ileus. No obstruction  12/12/2014: KUB   CBC  Recent Labs Lab 12/23/14 1046 12/25/14 0455 12/26/14 1630 12/27/14 0255 12/28/14 0531  WBC 11.0* 11.5*  --  12.4* 11.5*  HGB 7.4* 6.8* 6.9* 7.1* 6.3*  HCT 22.8* 20.3* 20.9* 22.0* 19.4*  PLT 194 167  --  158 106*  MCV 88.0 88.5  --  88.1 87.8  MCH 28.6 29.8  --  28.4 28.6  MCHC 32.5 33.7  --  32.2 32.6  RDW 19.0* 18.8*  --  19.0* 18.9*  LYMPHSABS  --   --   --  1.3  --   MONOABS  --   --   --  2.1*  --   EOSABS  --   --   --  0.2  --   BASOSABS  --   --   --  0.1  --     Chemistries   Recent Labs Lab 12/24/14 0556 12/25/14 0455 12/26/14 0528 12/27/14 0255 12/28/14 0531  NA 138 137 143 138 137  K 3.6 3.8  4.0 4.4 4.0  CL 99* 105 103 101 102  CO2 30 27 31 26 24   GLUCOSE 156* 142* 115* 195* 290*  BUN 33* 57* 30* 44* 75*  CREATININE 2.88* 4.12* 2.50* 3.68* 5.33*  CALCIUM 8.3* 8.3* 8.3* 8.6* 7.7*  MG 1.9 1.9 1.9 1.7 1.9   ------------------------------------------------------------------------------------------------------------------- No results for input(s): TSH, T4TOTAL, T3FREE, THYROIDAB in the last 72 hours.  Invalid input(s): FREET3 ------------------------------------------------------------------------------------------------------------------  Coagulation profile  Recent Labs Lab 12/25/14 0455  INR 1.22     Assessment & Plan  This is a 69 year old male with mantle cell lymphoma undergoing chemotherapy who was admitted June 15 from oncology clinic with altered mental status and chest pain.  # Acute encephalopathy - Has delirium at this time secondary to fever and sepsis   #  recurrent  Sepsis: Now patient has fever and possible sepsis: Continue to follow blood cultures, urine cultures, chest x-ray.  Started pt on  vancomycin and Zosyn to cover for aspiration pneumonia and also possible MRSA. Discontinued temporary dialysis catheter. Differential diagnosis .infected Port-A-Cath .Currently patient with no dialysis catheter Permacath placement is  deferred until Monday   Vancomycin given until  12/21/2014 as recommended by ID and d/ced for  Klebsiella UTI and bacteremia and Staph bacteremia Patient's urine culture and blood cultures were positive for Klebsiella on admission. [facility]. Repeat blood cx on 12/06/2014 showed  STAPHYLOCOCCUS AURICULARIS .   # Acute renal failure due to ATN ; Follow-up with nephrology Requested site rotation for temporary dialysis catheter. Currently with no temporary dialysis catheter, which was removed in view of recurrent sepsis   # Acute encephalopathy - Has delirium at this time secondary to fever and sepsis    # Mantle cell  lymphoma: Patient is on salvage chemotherapy. Patient prognosis is extremely poor secondary to  aneurysm or tumor community difficulties with previous chemotherapy. Cor status is DO NOT RESUSCITATE. I reviewed oncologist NOTES.  # Panctytopenia: Due to chemotherapy and Mantle cell lymphoma, patient is currently on Neupogen. Patient is s/p 2 units PRBC on 6/25 and 1 unit 12/14/2014 and has received PLT transfusion now stopped Patient needs premedication prior to any transfusions including Benadryl and Tylenol.   # Thrombocytopenia This is due to sepsis/lymphoma and chemo Improving,  # colonic Ileus with GI bleed: Patient is no longer neutropenic. Clinically improving.  discontinued NG tube and rectal tube 12/24/14   TPN, start appetite stimulants with Marinol. # Hypernatremia - resolved  # GI bleed: PPI. High risk for re bleeding due to thrombocytopenia.  # Acute blood loss anmeia over pancytopenia Due to GI losses and lymphoma. Monitor.    #Deconditioning PT    critically ill secondary to sepsis again.  Disposition- considering LTAC  Overall Prognosis is very poor   DNR Discussed with patient's daughter Baldo Ash over phone , currently in [place].   DVT Prophylaxis   SCD's    Lab Results  Component Value Date   PLT 106* 12/28/2014     Poor prognosis  Time spent 35 minutes.   Nicholes Mango M.D on 12/28/2014 at 12:14 PM  Between 7am to 6pm - Pager - [phone]  After 6pm go to [email or web address] - password [email or web address] [facility]   Office  [phone]

## 2014-12-28 NOTE — Progress Notes (Signed)
Subjective:  Unable to answer questions this morning. Wife at bedside.  TPN continued  UOP 2200 Creatinine increased to 5.33 (3.68) Tmax 103.1  HD catheter removed due to fever.  Objective:  Vital signs in last 24 hours:  Temp:  [97.5 F (36.4 C)-103.1 F (39.5 C)] 100.2 F (37.9 C) (07/16 0315) Pulse Rate:  [123-130] 127 (07/16 0315) Resp:  [19-20] 19 (07/15 1935) BP: (91-120)/(51-63) 103/63 mmHg (07/16 0315) SpO2:  [100 %] 100 % (07/16 0315) Weight:  [61.644 kg (135 lb 14.4 oz)] 61.644 kg (135 lb 14.4 oz) (07/16 0315)  Weight change: -3.81 kg (-8 lb 6.4 oz) Filed Weights   12/26/14 1506 12/27/14 0433 12/28/14 0315  Weight: 65.454 kg (144 lb 4.8 oz) 62.687 kg (138 lb 3.2 oz) 61.644 kg (135 lb 14.4 oz)    Intake/Output: I/O last 3 completed shifts: In: 5372.8 [I.V.:2011; IV Piggyback:600] Out: 3300 [Urine:3300]   Intake/Output this shift:     Physical Exam: General: chronically ill appearing  Head: oral mucosa dry  Eyes: Anicteric  Neck: Supple, trachea midline  Lungs:  Clear to auscultation normal effort  Heart: tachycardia  Abdomen:  Soft, NTND, BS present  Extremities: Trace LE edema  Neurologic: Confused, not following commands today  Skin: No acute rashes  GU foley  Access: No access     Basic Metabolic Panel:  Recent Labs Lab 12/24/14 0556 12/25/14 0455 12/26/14 0528 12/27/14 0255 12/28/14 0531  NA 138 137 143 138 137  K 3.6 3.8 4.0 4.4 4.0  CL 99* 105 103 101 102  CO2 30 27 31 26 24   GLUCOSE 156* 142* 115* 195* 290*  BUN 33* 57* 30* 44* 75*  CREATININE 2.88* 4.12* 2.50* 3.68* 5.33*  CALCIUM 8.3* 8.3* 8.3* 8.6* 7.7*  MG 1.9 1.9 1.9 1.7 1.9  PHOS 3.6 5.0* 2.4* 1.9* 6.2*    Liver Function Tests:  Recent Labs Lab 12/23/14 1046 12/28/14 0531  ALBUMIN 2.3* 1.9*   No results for input(s): LIPASE, AMYLASE in the last 168 hours. No results for input(s): AMMONIA in the last 168 hours.  CBC:  Recent Labs Lab 12/23/14 1046  12/25/14 0455 12/26/14 1630 12/27/14 0255 12/28/14 0531  WBC 11.0* 11.5*  --  12.4* 11.5*  NEUTROABS  --   --   --  8.8*  --   HGB 7.4* 6.8* 6.9* 7.1* 6.3*  HCT 22.8* 20.3* 20.9* 22.0* 19.4*  MCV 88.0 88.5  --  88.1 87.8  PLT 194 167  --  158 106*    Cardiac Enzymes: No results for input(s): CKTOTAL, CKMB, CKMBINDEX, TROPONINI in the last 168 hours.  BNP: Invalid input(s): POCBNP  CBG:  Recent Labs Lab 12/27/14 1334 12/27/14 1933 12/27/14 2013 12/28/14 0145 12/28/14 0730  GLUCAP 154* 154* 153* 164* 159*    Microbiology: Results for orders placed or performed during the hospital encounter of 11/29/14  MRSA PCR Screening     Status: None   Collection Time: 11/29/14 12:13 PM  Result Value Ref Range Status   MRSA by PCR NEGATIVE NEGATIVE Final    Comment:        The GeneXpert MRSA Assay (FDA approved for NASAL specimens only), is one component of a comprehensive MRSA colonization surveillance program. It is not intended to diagnose MRSA infection nor to guide or monitor treatment for MRSA infections.   Culture, blood (routine x 2)     Status: None   Collection Time: 11/29/14  1:19 PM  Result Value Ref Range Status  Specimen Description BLOOD  Final   Special Requests Immunocompromised  Final   Culture NO GROWTH 5 DAYS  Final   Report Status 12/04/2014 FINAL  Final  Urine culture     Status: None   Collection Time: 11/30/14 10:12 AM  Result Value Ref Range Status   Specimen Description URINE, CATHETERIZED  Final   Special Requests Immunocompromised  Final   Culture NO GROWTH 2 DAYS  Final   Report Status 12/02/2014 FINAL  Final  Culture, blood (routine x 2)     Status: None   Collection Time: 12/06/14  8:08 PM  Result Value Ref Range Status   Specimen Description BLOOD  Final   Special Requests NONE  Final   Culture  Setup Time   Final    GRAM POSITIVE COCCI IN BOTH AEROBIC AND ANAEROBIC BOTTLES CRITICAL RESULT CALLED TO, READ BACK BY AND VERIFIED  WITH: JENNIFER BEZARD AT 2355 12/08/14.PMH CONFIRMED BY [facility]    Culture   Final    STAPHYLOCOCCUS AURICULARIS IN BOTH AEROBIC AND ANAEROBIC BOTTLES    Report Status 12/11/2014 FINAL  Final   Organism ID, Bacteria STAPHYLOCOCCUS AURICULARIS  Final      Susceptibility   Staphylococcus auricularis - MIC*    CIPROFLOXACIN >=8 RESISTANT Resistant     ERYTHROMYCIN >=8 RESISTANT Resistant     GENTAMICIN <=0.5 SENSITIVE Sensitive     OXACILLIN >=4 RESISTANT Resistant     TETRACYCLINE <=1 SENSITIVE Sensitive     VANCOMYCIN 1 SENSITIVE Sensitive     CLINDAMYCIN <=0.25 SENSITIVE Sensitive     TRIMETH/SULFA Value in next row Sensitive      SENSITIVE<=20    LEVOFLOXACIN Value in next row Intermediate      INTERMEDIATE4    * STAPHYLOCOCCUS AURICULARIS  Culture, blood (routine x 2)     Status: None   Collection Time: 12/06/14  8:40 PM  Result Value Ref Range Status   Specimen Description BLOOD  Final   Special Requests NONE  Final   Culture NO GROWTH 5 DAYS  Final   Report Status 12/11/2014 FINAL  Final  Culture, blood (routine x 2)     Status: None   Collection Time: 12/08/14 11:40 AM  Result Value Ref Range Status   Specimen Description BLOOD  Final   Special Requests Normal  Final   Culture NO GROWTH 6 DAYS  Final   Report Status 12/14/2014 FINAL  Final  Culture, blood (routine x 2)     Status: None   Collection Time: 12/08/14 11:49 AM  Result Value Ref Range Status   Specimen Description BLOOD  Final   Special Requests Normal  Final   Culture NO GROWTH 6 DAYS  Final   Report Status 12/14/2014 FINAL  Final  C difficile quick scan w PCR reflex ([facility] only)     Status: None   Collection Time: 12/14/14 10:11 AM  Result Value Ref Range Status   C Diff antigen NEGATIVE  Final   C Diff toxin NEGATIVE  Final   C Diff interpretation Negative for C. difficile  Final  Cath Tip Culture     Status: None (Preliminary result)   Collection Time: 12/27/14  4:17 PM  Result Value Ref Range Status    Specimen Description CATH TIP  Final   Special Requests NONE  Final   Culture NO GROWTH <24 HRS  Final   Report Status PENDING  Incomplete    Coagulation Studies: No results for input(s): LABPROT, INR in the last  72 hours.  Urinalysis: No results for input(s): COLORURINE, LABSPEC, PHURINE, GLUCOSEU, HGBUR, BILIRUBINUR, KETONESUR, PROTEINUR, UROBILINOGEN, NITRITE, LEUKOCYTESUR in the last 72 hours.  Invalid input(s): APPERANCEUR    Imaging: Dg Chest 1 View  12/27/2014   CLINICAL DATA:  Patient with fever.  EXAM: CHEST  1 VIEW  COMPARISON:  Chest radiograph earlier same day  FINDINGS: Right anterior chest wall Port-A-Cath tip projects over the superior vena cava. Monitoring lead overlies the patient. Stable enlarged cardiac and mediastinal contours with tortuosity of the thoracic aorta. No consolidative pulmonary opacities. No pleural effusion or pneumothorax. Nonspecific band of sclerosis within the proximal right humerus, incompletely visualized.  IMPRESSION: Incompletely visualized band of sclerosis within the proximal right humerus. Recommend correlation with dedicated radiographs of the right shoulder/ humerus for further evaluation of probable subacute/healing fracture or osseous lesion.  No acute cardiopulmonary process.   Electronically Signed   By: Lovey Newcomer M.D.   On: 12/27/2014 11:19   Dg Chest Port 1 View  12/27/2014   CLINICAL DATA:  Shortness of breath starting last night. Former smoker. Current lymphoma. Renal failure.  EXAM: PORTABLE CHEST - 1 VIEW  COMPARISON:  12/06/2014  FINDINGS: Shallow inspiration. Cardiac enlargement without vascular congestion. Power port type central venous catheter with tip over the low SVC region. No pneumothorax. No focal infiltration or consolidation. No blunting of costophrenic angles. No pneumothorax. Tortuous and calcified aorta.  IMPRESSION: Shallow inspiration. Cardiac enlargement. No evidence of active infiltration.   Electronically Signed    By: Lucienne Capers M.D.   On: 12/27/2014 05:33     Medications:   . Marland KitchenTPN (CLINIMIX-E) Adult 70 mL/hr at 12/28/14 0303  . amiodarone 30 mg/hr (12/28/14 0303)   . sodium chloride   Intravenous Once  . antiseptic oral rinse  7 mL Mouth Rinse BID  . carvedilol  20 mg Oral Daily  . dronabinol  2.5 mg Oral BID AC  . feeding supplement (NEPRO CARB STEADY)  237 mL Oral BID BM  . fluconazole (DIFLUCAN) IV  100 mg Intravenous Q24H  . insulin aspart  0-9 Units Subcutaneous Q6H  . mometasone-formoterol  2 puff Inhalation BID  . pantoprazole (PROTONIX) IV  40 mg Intravenous Q12H  . piperacillin-tazobactam (ZOSYN)  IV  3.375 g Intravenous Q12H  . scopolamine  1 patch Transdermal Q72H  . Vancomycin  750 mg Intravenous Once per day on Tue Thu Sat  . ziprasidone  10 mg Intramuscular QHS   acetaminophen **OR** acetaminophen, albuterol, haloperidol lactate, heparin lock flush, lidocaine (PF), lidocaine-prilocaine, magnesium hydroxide, metoprolol, morphine injection, ondansetron (ZOFRAN) IV, promethazine, sodium chloride, sodium chloride  Assessment/ Plan:  32 AAM with progressive mantle cell lymphoma, RICE chemo in 11/2014, hx left sided hydronephrosis and hydroureter. S/p ureteral stent placement   Hospital course complicated by sepsis with Klebsiella oxytoca, hypernatremia  1.  Acute renal failure due to ATN: Pt has undergone multiple dialysis sessions.  Mental status does appear slightly improved after dialysis. Last hemodialysis on July 14. Nonoliguric but with poor creatinine clearance.  - monitor UOP now. Dialysis treatments were started July 6 - PermCath placement to be deferred since patient spiked a fever.  - Currently without catheter, will place if dialysis required. Plan for next treatment for July 18.  - May consider LTAC as extended recovery is anticipated  2.  Sepsis with klebsiella oxytoca: completed two weeks for IV ceftazidime.  Appears resolved.  - With new onset of fever,  concern for new source of infection.  - empiric  antibiotics: zosyn, vanc - on TPN, increased risk of fungal infections.    3.  Anemia unspecified D64.9: Hemoglobin of 6.3. With thrombocytopenia - plan on PRBC transfusion today.  - epo as per oncology.   4. Hypokalemia: continue to monitor. TPN to replace electrolytes carefully.      LOS: [place], [place] 7/16/20169:57 AM

## 2014-12-29 DIAGNOSIS — B49 Unspecified mycosis: Secondary | ICD-10-CM | POA: Diagnosis present

## 2014-12-29 DIAGNOSIS — N289 Disorder of kidney and ureter, unspecified: Secondary | ICD-10-CM

## 2014-12-29 LAB — GLUCOSE, CAPILLARY
GLUCOSE-CAPILLARY: 146 mg/dL — AB (ref 65–99)
GLUCOSE-CAPILLARY: 61 mg/dL — AB (ref 65–99)
GLUCOSE-CAPILLARY: 73 mg/dL (ref 65–99)
Glucose-Capillary: 157 mg/dL — ABNORMAL HIGH (ref 65–99)
Glucose-Capillary: 77 mg/dL (ref 65–99)
Glucose-Capillary: 89 mg/dL (ref 65–99)

## 2014-12-29 LAB — CBC
HCT: 21.4 % — ABNORMAL LOW (ref 40.0–52.0)
Hemoglobin: 7 g/dL — ABNORMAL LOW (ref 13.0–18.0)
MCH: 28.7 pg (ref 26.0–34.0)
MCHC: 32.8 g/dL (ref 32.0–36.0)
MCV: 87.5 fL (ref 80.0–100.0)
Platelets: 81 10*3/uL — ABNORMAL LOW (ref 150–440)
RBC: 2.45 MIL/uL — ABNORMAL LOW (ref 4.40–5.90)
RDW: 18.7 % — ABNORMAL HIGH (ref 11.5–14.5)
WBC: 14.1 10*3/uL — ABNORMAL HIGH (ref 3.8–10.6)

## 2014-12-29 LAB — BASIC METABOLIC PANEL
ANION GAP: 16 — AB (ref 5–15)
BUN: 96 mg/dL — ABNORMAL HIGH (ref 6–20)
CHLORIDE: 101 mmol/L (ref 101–111)
CO2: 22 mmol/L (ref 22–32)
Calcium: 7.8 mg/dL — ABNORMAL LOW (ref 8.9–10.3)
Creatinine, Ser: 6.51 mg/dL — ABNORMAL HIGH (ref 0.61–1.24)
GFR calc Af Amer: 9 mL/min — ABNORMAL LOW (ref >60)
GFR calc non Af Amer: 8 mL/min — ABNORMAL LOW (ref >60)
Glucose, Bld: 214 mg/dL — ABNORMAL HIGH (ref 65–99)
Potassium: 3.9 mmol/L (ref 3.5–5.1)
Sodium: 139 mmol/L (ref 135–145)

## 2014-12-29 LAB — PREPARE RBC (CROSSMATCH)

## 2014-12-29 LAB — PHOSPHORUS: PHOSPHORUS: 7.6 mg/dL — AB (ref 2.5–4.6)

## 2014-12-29 LAB — MAGNESIUM: MAGNESIUM: 2 mg/dL (ref 1.7–2.4)

## 2014-12-29 MED ORDER — VANCOMYCIN HCL IN DEXTROSE 750-5 MG/150ML-% IV SOLN
750.0000 mg | INTRAVENOUS | Status: DC
  Filled 2014-12-29: qty 150

## 2014-12-29 MED ORDER — ALBUMIN HUMAN 25 % IV SOLN
12.5000 g | Freq: Once | INTRAVENOUS | Status: AC
  Administered 2014-12-29: 12.5 g via INTRAVENOUS
  Filled 2014-12-29: qty 50

## 2014-12-29 MED ORDER — SODIUM CHLORIDE 0.9 % IV SOLN: Freq: Once | INTRAVENOUS | Status: DC

## 2014-12-29 MED ORDER — SODIUM CHLORIDE 0.9 % IV SOLN
100.0000 mg | Freq: Every day | INTRAVENOUS | Status: DC
  Administered 2014-12-29 – 2015-01-09 (×12): 100 mg via INTRAVENOUS
  Filled 2014-12-29 (×15): qty 100

## 2014-12-29 MED ORDER — DEXTROSE-NACL 5-0.45 % IV SOLN
INTRAVENOUS | Status: DC
  Administered 2014-12-29 – 2015-01-04 (×4): via INTRAVENOUS

## 2014-12-29 NOTE — Progress Notes (Signed)
Critical lab value reported to Dr. Lavetta Nielsen, yeast growing in aerobic bottle.

## 2014-12-29 NOTE — Consult Note (Signed)
[facility] for Infectious Disease    Date of Admission:  11/29/2014   Day 3 fluconazole        Day 3 vancomycin        Day 3 piperacillin tazobactam       Reason for Consult: Automatic consultation for fungemia     Principal Problem:   Fungemia Active Problems:   Mantle cell lymphoma   Sepsis   Thrombocytopenia   Absolute anemia   Rheumatoid arthritis with rheumatoid factor   Altered mental status   Chest pain   Metabolic encephalopathy   Ileus   Fecal impaction   Protein-calorie malnutrition, severe   Ogilvie's syndrome   Pseudo-obstruction of colon   Pressure ulcer   Acute renal insufficiency   . sodium chloride   Intravenous Once  . antiseptic oral rinse  7 mL Mouth Rinse BID  . carvedilol  20 mg Oral Daily  . dronabinol  2.5 mg Oral BID AC  . feeding supplement (NEPRO CARB STEADY)  237 mL Oral BID BM  . fluconazole (DIFLUCAN) IV  100 mg Intravenous Q24H  . insulin aspart  0-9 Units Subcutaneous Q6H  . mometasone-formoterol  2 puff Inhalation BID  . pantoprazole (PROTONIX) IV  40 mg Intravenous Q12H  . piperacillin-tazobactam (ZOSYN)  IV  3.375 g Intravenous Q12H  . scopolamine  1 patch Transdermal Q72H  . [START ON 12/30/2014] Vancomycin  750 mg Intravenous Once per day on Mon Thu Sat  . ziprasidone  10 mg Intramuscular QHS    Recommendations: 1. Change fluconazole to IV micafungin 2. Ask laboratory to obtain identification and susceptibilities on the yeast isolate 3. Repeat blood cultures 4. Consider stopping vancomycin and piperacillin tazobactam 5. Recommend removing Port-A-Cath and holding off on permanent hemodialysis catheter placement until blood cultures are documented negative   Assessment: Mr. Whitehorn has fungemia and given his recent prolonged hospitalizations, chemotherapy and exposure to antibiotics he is at high risk for infection with yeast that are likely to be resistant to azole antifungals such as fluconazole. I recommend  changing to micafungin pending identification and susceptibility testing. He needs repeat blood cultures and will need at least 14 days of effective antifungal therapy following the first negative blood culture. I fungemia is almost certainly the cause of his recent fever. He does not have clinical evidence of pneumonia I would stop his other empiric antibiotics. According to Infectious Disease Society of [place] guidelines his Port-A-Cath should be removed as it is likely the source of infection. If at all possible it would be best to hold off placing any permanent lines until followup blood cultures are documented to be negative.   HPI: Caleb Francis is a 69 y.o. male is currently being treated for mantle cell lymphoma. He was admitted on 11/29/2014 with confusion. He was found to have Klebsiella bacteremia complicating a urinary tract infection. He was treated with ceftriaxone. He became neutropenic and was treated for possible bacteremia with staph or reticularis. He completed a round of antibiotic therapy on 12/21/2014. He spiked a temperature to 103.2 on 12/26/2014 in both blood cultures from that date are growing yeast. He has a Port-A-Cath. It appears that his temporary hemodialysis catheter was removed yesterday. Catheter tip cultures are negative at 24 hours. Chest x-ray on 12/27/2014 did not show any evidence of pneumonia.   Review of Systems: Review of systems not obtained due to patient factors.  Past Medical History  Diagnosis Date  .  Arthritis   . Hypertension   . RA (rheumatoid arthritis)   . Anemia   . Mantle cell lymphoma     Dr Mike Gip  . History of nuclear stress test     a. 12/2013: low risk, no sig ischemia, no EKG changes, no artifact, EF 63%  . Chronic kidney disease   . Sepsis     Dr Ola Spurr    History  Substance Use Topics  . Smoking status: Former Smoker -- 0.50 packs/day for 60 years    Types: Cigarettes    Quit date: 10/18/2014  . Smokeless tobacco:  Not on file  . Alcohol Use: No    Family History  Problem Relation Age of Onset  . Cancer Mother     breast  . Cancer Father     bone cancer  . Colon cancer Neg Hx   . Liver disease Neg Hx    No Known Allergies  OBJECTIVE: Blood pressure 84/52, pulse 117, temperature 100.1 F (37.8 C), temperature source Axillary, resp. rate 22, height 6' (1.829 m), weight 140 lb 11.2 oz (63.821 kg), SpO2 97 %. General: this is a remote EPIC consultation so no patient examination was possible  Lab Results Lab Results  Component Value Date   WBC 14.1* 12/29/2014   HGB 7.0* 12/29/2014   HCT 21.4* 12/29/2014   MCV 87.5 12/29/2014   PLT 81* 12/29/2014    Lab Results  Component Value Date   CREATININE 6.51* 12/29/2014   BUN 96* 12/29/2014   NA 139 12/29/2014   K 3.9 12/29/2014   CL 101 12/29/2014   CO2 22 12/29/2014    Lab Results  Component Value Date   ALT 23 12/21/2014   AST 26 12/21/2014   ALKPHOS 216* 12/21/2014   BILITOT 0.8 12/21/2014     Microbiology: Recent Results (from the past 240 hour(s))  Culture, blood (routine x 2)     Status: None (Preliminary result)   Collection Time: 12/27/14 11:28 AM  Result Value Ref Range Status   Specimen Description BLOOD  Final   Special Requests Immunocompromised  Final   Culture  Setup Time   Final    YEAST IDENTIFICATION TO FOLLOW AEROBIC BOTTLE ONLY CRITICAL RESULT CALLED TO, READ BACK BY AND VERIFIED WITH: CATHY SUMMERLIN ON 12/29/14 AT 1000AM BY JEF    Culture YEAST IDENTIFICATION TO FOLLOW   Final   Report Status PENDING  Incomplete  Culture, blood (routine x 2)     Status: None (Preliminary result)   Collection Time: 12/27/14 11:36 AM  Result Value Ref Range Status   Specimen Description BLOOD  Final   Special Requests Immunocompromised  Final   Culture  Setup Time   Final    YEAST AEROBIC BOTTLE ONLY CRITICAL RESULT CALLED TO, READ BACK BY AND VERIFIED WITH: LISA ROMERO AT 0111 ON 12/29/14 [facility] CONFIRMED BY [place]     Culture   Final    YEAST AEROBIC BOTTLE ONLY IDENTIFICATION TO FOLLOW    Report Status PENDING  Incomplete  Cath Tip Culture     Status: None (Preliminary result)   Collection Time: 12/27/14  4:17 PM  Result Value Ref Range Status   Specimen Description CATH TIP  Final   Special Requests NONE  Final   Culture NO GROWTH <24 HRS  Final   Report Status PENDING  Incomplete    Michel Bickers, MD [facility] [place] [phone] pager   [phone] cell 12/29/2014, 10:52 AM

## 2014-12-29 NOTE — Progress Notes (Signed)
Dr. Marcille Blanco notified of increased BUN and creatine labs. Will continue to monitor patient.

## 2014-12-29 NOTE — Op Note (Signed)
    Patient name: Caleb Francis MRN: [id number] DOB: Sep 11, 1945 Sex: male  12/27/2014 Pre-operative Diagnosis: Renal Failure Post-operative diagnosis:  Same Surgeon:  Annamarie Major Procedure:   Right Femoral temporary dialysis catheter Anesthesia:  local Blood Loss:  See anesthesia record  Indications:  Patient with fungemia and renal failure.  Also needs blood transfusion  Procedure:  The patient was identified in his room.  The patient is confused and no family was available, therefore this was done under emergent consent.  The right groin was prepped and draped with sterile conditions.  1% lidocaine was administered.  The right femoral vein was canulated with an 18g needle.  A 035 wire was advanced without resistance.  The tract was dilated.  A 42F catheter was placed. Both ports flushed and aspirated without difficulty.  The catheter was sutured into position.  Sterile dressings applied  Disposition:  To PACU in stable condition.   Theotis Burrow, M.D. [facility] Office: [phone] Pager:  [phone]

## 2014-12-29 NOTE — Progress Notes (Signed)
HD END 

## 2014-12-29 NOTE — Progress Notes (Signed)
HD START 

## 2014-12-29 NOTE — Progress Notes (Signed)
PRE HD   

## 2014-12-29 NOTE — Progress Notes (Signed)
Nutrition Follow-up  DOCUMENTATION CODES:   Severe malnutrition in context of acute illness/injury  INTERVENTION:   Coordination of care: Spoke with Dr. Margaretmary Eddy, stopping TPN today secondary to yeast in blood culture.  Dr. Margaretmary Eddy wanting to keep NPO today and have SLP re-evaluate tomorrow for po intake. Discussed checking blood glucose with RN, Juliann Pulse after discontinuing TPN    NUTRITION DIAGNOSIS:   Inadequate oral intake related to inability to eat as evidenced by NPO status.   GOAL:   Patient will meet greater than or equal to 90% of their needs  Not meeting nutritional goals at this time  MONITOR:    (Energy intake, Anthropometrics, Electrolyte/renal profile, Glucose profile, Digestive System)  REASON FOR ASSESSMENT:   Consult Poor PO (diet advancement)  ASSESSMENT:    Placing HD cath today to re-start HD.     Current Nutrition: NPO   Medications: reviewed  Electrolyte/Renal Profile and Glucose Profile:   Recent Labs Lab 12/27/14 0255 12/28/14 0531 12/29/14 0430  NA 138 137 139  K 4.4 4.0 3.9  CL 101 102 101  CO2 26 24 22   BUN 44* 75* 96*  CREATININE 3.68* 5.33* 6.51*  CALCIUM 8.6* 7.7* 7.8*  MG 1.7 1.9 2.0  PHOS 1.9* 6.2* 7.6*  GLUCOSE 195* 290* 214*   Protein Profile:  Recent Labs Lab 12/23/14 1046 12/28/14 0531  ALBUMIN 2.3* 1.9*     Weight Trend since Admission: Filed Weights   12/27/14 0433 12/28/14 0315 12/29/14 0547  Weight: 138 lb 3.2 oz (62.687 kg) 135 lb 14.4 oz (61.644 kg) 140 lb 11.2 oz (63.821 kg)     Diet Order:  Diet NPO time specified Except for: Sips with Meds  Skin:  Reviewed, no issues   Height:   Ht Readings from Last 1 Encounters:  11/29/14 6' (1.829 m)    Weight:   Wt Readings from Last 1 Encounters:  12/29/14 140 lb 11.2 oz (63.821 kg)       Wt Readings from Last 10 Encounters:  12/29/14 140 lb 11.2 oz (63.821 kg)  11/27/14 150 lb 5.7 oz (68.2 kg)  11/25/14 156 lb (70.761 kg)  11/18/14 157 lb 11.2  oz (71.532 kg)  11/05/14 164 lb 0.4 oz (74.4 kg)  10/24/14 165 lb (74.844 kg)  10/24/14 169 lb 5 oz (76.8 kg)  10/22/14 168 lb (76.204 kg)    BMI:  Body mass index is 19.08 kg/(m^2).  Estimated Nutritional Needs:   Kcal:  5621-3086 kcals (BEE 1436, 1.2 AF, 1.2-1.4 IF) using current wt of 62.9 kg  Protein:  76-95 (1.2-1.5 g/kg)   Fluid:  1575-1890 mL (25-30 ml/kg)   EDUCATION NEEDS:   No education needs identified at this time  [place]. Zenia Resides, [place], [place] (pager)

## 2014-12-29 NOTE — Progress Notes (Signed)
POST HD 

## 2014-12-29 NOTE — Progress Notes (Addendum)
[place] at [facility]                                                                                                                                                                                            Patient Demographics   Caleb Francis, is a 69 y.o. male, DOB - [birth year], TMH:[id number]  Admit date - 11/29/2014   Admitting Physician Aldean Jewett, MD  Outpatient Primary MD for the patient is No PCP Per Patient   Subjective:  patient is more alert and  feeling relatively better todayl. Wife and son are at bedside, daughter is in [place].   Review of Systems:   Review of Systems  Constitutional: Negative for fever, chills and malaise/fatigue.  HENT: Negative for ear pain, hearing loss, nosebleeds and tinnitus.   Eyes: Negative for blurred vision, double vision, photophobia and pain.  Respiratory: Negative for cough, hemoptysis and shortness of breath.   Cardiovascular: Negative for chest pain, palpitations, orthopnea and leg swelling.  Gastrointestinal: Negative for heartburn, vomiting, abdominal pain and diarrhea.  Genitourinary: Negative for dysuria, urgency and flank pain.  Musculoskeletal: Negative for myalgias, back pain and neck pain.  Skin: Negative for itching and rash.  Neurological: Negative for dizziness, tremors, sensory change, focal weakness, seizures, weakness and headaches.  Endo/Heme/Allergies: Negative for environmental allergies. Does not bruise/bleed easily.  Psychiatric/Behavioral: Negative for memory loss. The patient is not nervous/anxious and does not have insomnia.      Vitals:   Filed Vitals:   12/29/14 1245 12/29/14 1259 12/29/14 1300 12/29/14 1330  BP: 87/53 71/45 77/47  83/51  Pulse: 108 118 116 118  Temp: 100.1 F (37.8 C)     TempSrc: Axillary     Resp: 29 30 34 23  Height:      Weight: 63 kg (138 lb 14.2 oz)     SpO2: 99%       Wt Readings from Last 3 Encounters:  12/29/14 63 kg (138 lb  14.2 oz)  11/27/14 68.2 kg (150 lb 5.7 oz)  11/25/14 70.761 kg (156 lb)     Intake/Output Summary (Last 24 hours) at 12/29/14 1337 Last data filed at 12/28/14 1804  Gross per 24 hour  Intake    267 ml  Output    200 ml  Net     67 ml    Physical Exam:   GENERAL: Not in acute distress more awake and alert today  PERRLA: Pupils equal reacting to light extraocular movements are intact. HEENT head atraumatic normocephalic. ENT unremarkable  NECK: Supple. There is no jugular venous distention. No bruits, no lymphadenopathy, no  thyromegaly.  HEART:  Tachycardic 2/6 systolic ejection murmur no rubs LUNGS: nml  breath sounds. ABDOMEN: active bowel sounds in all 4 quadrants without rebound or guarding  EXTREMITIES: No evidence of any cyanosis, clubbing,   NEUROLOGIC: Awake and alert, oriented 3  Motor and sensory are grossly intact. Reflexes 2+ SKIN: Moist and warm with no rashes appreciated.  Psych:    no agitation reported overnight.  Radiology Reports 12/10/2014: CT abdomen: Large distended colonic ileus. No obstruction  12/12/2014: KUB   CBC  Recent Labs Lab 12/23/14 1046 12/25/14 0455 12/26/14 1630 12/27/14 0255 12/28/14 0531 12/29/14 0430  WBC 11.0* 11.5*  --  12.4* 11.5* 14.1*  HGB 7.4* 6.8* 6.9* 7.1* 6.3* 7.0*  HCT 22.8* 20.3* 20.9* 22.0* 19.4* 21.4*  PLT 194 167  --  158 106* 81*  MCV 88.0 88.5  --  88.1 87.8 87.5  MCH 28.6 29.8  --  28.4 28.6 28.7  MCHC 32.5 33.7  --  32.2 32.6 32.8  RDW 19.0* 18.8*  --  19.0* 18.9* 18.7*  LYMPHSABS  --   --   --  1.3  --   --   MONOABS  --   --   --  2.1*  --   --   EOSABS  --   --   --  0.2  --   --   BASOSABS  --   --   --  0.1  --   --     Chemistries   Recent Labs Lab 12/25/14 0455 12/26/14 0528 12/27/14 0255 12/28/14 0531 12/29/14 0430  NA 137 143 138 137 139  K 3.8 4.0 4.4 4.0 3.9  CL 105 103 101 102 101  CO2 27 31 26 24 22   GLUCOSE 142* 115* 195* 290* 214*  BUN 57* 30* 44* 75* 96*  CREATININE 4.12*  2.50* 3.68* 5.33* 6.51*  CALCIUM 8.3* 8.3* 8.6* 7.7* 7.8*  MG 1.9 1.9 1.7 1.9 2.0   ------------------------------------------------------------------------------------------------------------------- No results for input(s): TSH, T4TOTAL, T3FREE, THYROIDAB in the last 72 hours.  Invalid input(s): FREET3 ------------------------------------------------------------------------------------------------------------------  Coagulation profile  Recent Labs Lab 12/25/14 0455  INR 1.22     Assessment & Plan  This is a 69 year old male with mantle cell lymphoma undergoing chemotherapy who was admitted June 15 from oncology clinic with altered mental status and chest pain.  # Acute encephalopathy - Has delirium at this time secondary to fever and sepsis   #  recurrent  Sepsis: Now patient has fever and septic with fungemia- Continue to follow blood cultures showing yeast, urine cultures, chest x-ray.  Started pt on mycofungin,  vancomycin and Zosyn to cover for aspiration pneumonia and also possible MRSA. Discontinued temporary dialysis catheter. New right femoral temporary dialysis catheter just placed today . Permacath placement is  deferred until Monday Discontinued TPN Currently patient is nothing by mouth until speech evaluation in a.m.   Vancomycin given until  12/21/2014 as recommended by ID and d/ced for  Klebsiella UTI and bacteremia and Staph bacteremia Patient's urine culture and blood cultures were positive for Klebsiella on admission. [place]. Repeat blood cx on 12/06/2014 showed  STAPHYLOCOCCUS AURICULARIS .   # Acute renal failure due to ATN ; Follow-up with nephrology Requested site rotation for temporary dialysis catheter. Planning to do hemodialysis after temporary dialysis catheter placement today  # Acute encephalopathy - Has delirium which is better currently    # Mantle cell lymphoma: Patient is on salvage chemotherapy. Patient prognosis is extremely poor  secondary to aneurysm or tumor community difficulties with previous chemotherapy. Cor status is DO NOT RESUSCITATE. I reviewed oncologist NOTES.  # Panctytopenia: Due to chemotherapy and Mantle cell lymphoma, patient is currently on Neupogen. Patient is s/p 2 units PRBC on 6/25 and 1 unit 12/14/2014 and has received PLT transfusion now stopped Patient needs premedication prior to any transfusions including Benadryl and Tylenol.   # Thrombocytopenia This is due to sepsis/lymphoma and chemo Improving,  # colonic Ileus with GI bleed: Patient is no longer neutropenic. Clinically improving.  discontinued NG tube and rectal tube 12/24/14    # Hypernatremia - resolved  # GI bleed: PPI. High risk for re bleeding due to thrombocytopenia.  # Acute blood loss anmeia over pancytopenia Due to GI losses and lymphoma. Monitor.  Status post PRBC transfusion yesterday and 1 unit today   #Deconditioning PT consult is pending    Disposition- considering LTAC  Overall Prognosis is very poor   DNR Discussed with patient's daughter Baldo Ash over phone , currently in [place].  Had a family meeting today with patient's wife and son at bedside. All their questions were answered. Agreeable with current plan of care. RN cathy was present during the discussion  DVT Prophylaxis   SCD's    Lab Results  Component Value Date   PLT 81* 12/29/2014     Poor prognosis  Time spent 35 minutes.   Nicholes Mango M.D on 12/29/2014 at 1:37 PM  Between 7am to 6pm - Pager - [phone]  After 6pm go to [email or web address] - password [email or web address] [place]   Office  [phone]

## 2014-12-29 NOTE — Progress Notes (Signed)
Subjective:  More alert this morning. However no family at bedside.  Yeast on blood culture. Fluconazole started.  TPN continued overnight UOP 650 (2200)  Creatinine increased to 6.51 (5.33) (3.68). BUN 96 (75) Tmax 100.1  Objective:  Vital signs in last 24 hours:  Temp:  [97.4 F (36.3 C)-100.6 F (38.1 C)] 100.1 F (37.8 C) (07/17 0547) Pulse Rate:  [76-123] 117 (07/17 0547) Resp:  [20-26] 22 (07/17 0547) BP: (84-113)/(51-67) 84/52 mmHg (07/17 0547) SpO2:  [97 %-100 %] 97 % (07/17 0547) Weight:  [63.821 kg (140 lb 11.2 oz)] 63.821 kg (140 lb 11.2 oz) (07/17 0547)  Weight change: 2.177 kg (4 lb 12.8 oz) Filed Weights   12/27/14 0433 12/28/14 0315 12/29/14 0547  Weight: 62.687 kg (138 lb 3.2 oz) 61.644 kg (135 lb 14.4 oz) 63.821 kg (140 lb 11.2 oz)    Intake/Output: I/O last 3 completed shifts: In: 2988 [I.V.:2011; Blood:267; IV Piggyback:50] Out: 5790 [Urine:1850]   Intake/Output this shift:     Physical Exam: General: chronically ill appearing  Head: oral mucosa dry  Eyes: Anicteric  Neck: Supple, trachea midline  Lungs:  Clear to auscultation normal effort  Heart: tachycardia  Abdomen:  Soft, NTND, BS present  Extremities: Trace LE edema  Neurologic: Confused, not following commands today  Skin: No acute rashes  GU foley  Access: No access     Basic Metabolic Panel:  Recent Labs Lab 12/25/14 0455 12/26/14 0528 12/27/14 0255 12/28/14 0531 12/29/14 0430  NA 137 143 138 137 139  K 3.8 4.0 4.4 4.0 3.9  CL 105 103 101 102 101  CO2 27 31 26 24 22   GLUCOSE 142* 115* 195* 290* 214*  BUN 57* 30* 44* 75* 96*  CREATININE 4.12* 2.50* 3.68* 5.33* 6.51*  CALCIUM 8.3* 8.3* 8.6* 7.7* 7.8*  MG 1.9 1.9 1.7 1.9 2.0  PHOS 5.0* 2.4* 1.9* 6.2* 7.6*    Liver Function Tests:  Recent Labs Lab 12/23/14 1046 12/28/14 0531  ALBUMIN 2.3* 1.9*   No results for input(s): LIPASE, AMYLASE in the last 168 hours. No results for input(s): AMMONIA in the last 168  hours.  CBC:  Recent Labs Lab 12/23/14 1046 12/25/14 0455 12/26/14 1630 12/27/14 0255 12/28/14 0531 12/29/14 0430  WBC 11.0* 11.5*  --  12.4* 11.5* 14.1*  NEUTROABS  --   --   --  8.8*  --   --   HGB 7.4* 6.8* 6.9* 7.1* 6.3* 7.0*  HCT 22.8* 20.3* 20.9* 22.0* 19.4* 21.4*  MCV 88.0 88.5  --  88.1 87.8 87.5  PLT 194 167  --  158 106* 81*    Cardiac Enzymes: No results for input(s): CKTOTAL, CKMB, CKMBINDEX, TROPONINI in the last 168 hours.  BNP: Invalid input(s): POCBNP  CBG:  Recent Labs Lab 12/28/14 0730 12/28/14 1527 12/28/14 2241 12/29/14 0202 12/29/14 0733  GLUCAP 159* 177* 162* 157* 146*    Microbiology: Results for orders placed or performed during the hospital encounter of 11/29/14  MRSA PCR Screening     Status: None   Collection Time: 11/29/14 12:13 PM  Result Value Ref Range Status   MRSA by PCR NEGATIVE NEGATIVE Final    Comment:        The GeneXpert MRSA Assay (FDA approved for NASAL specimens only), is one component of a comprehensive MRSA colonization surveillance program. It is not intended to diagnose MRSA infection nor to guide or monitor treatment for MRSA infections.   Culture, blood (routine x 2)  Status: None   Collection Time: 11/29/14  1:19 PM  Result Value Ref Range Status   Specimen Description BLOOD  Final   Special Requests Immunocompromised  Final   Culture NO GROWTH 5 DAYS  Final   Report Status 12/04/2014 FINAL  Final  Urine culture     Status: None   Collection Time: 11/30/14 10:12 AM  Result Value Ref Range Status   Specimen Description URINE, CATHETERIZED  Final   Special Requests Immunocompromised  Final   Culture NO GROWTH 2 DAYS  Final   Report Status 12/02/2014 FINAL  Final  Culture, blood (routine x 2)     Status: None   Collection Time: 12/06/14  8:08 PM  Result Value Ref Range Status   Specimen Description BLOOD  Final   Special Requests NONE  Final   Culture  Setup Time   Final    GRAM POSITIVE  COCCI IN BOTH AEROBIC AND ANAEROBIC BOTTLES CRITICAL RESULT CALLED TO, READ BACK BY AND VERIFIED WITH: JENNIFER BEZARD AT 7062 12/08/14.PMH CONFIRMED BY [facility]    Culture   Final    STAPHYLOCOCCUS AURICULARIS IN BOTH AEROBIC AND ANAEROBIC BOTTLES    Report Status 12/11/2014 FINAL  Final   Organism ID, Bacteria STAPHYLOCOCCUS AURICULARIS  Final      Susceptibility   Staphylococcus auricularis - MIC*    CIPROFLOXACIN >=8 RESISTANT Resistant     ERYTHROMYCIN >=8 RESISTANT Resistant     GENTAMICIN <=0.5 SENSITIVE Sensitive     OXACILLIN >=4 RESISTANT Resistant     TETRACYCLINE <=1 SENSITIVE Sensitive     VANCOMYCIN 1 SENSITIVE Sensitive     CLINDAMYCIN <=0.25 SENSITIVE Sensitive     TRIMETH/SULFA Value in next row Sensitive      SENSITIVE<=20    LEVOFLOXACIN Value in next row Intermediate      INTERMEDIATE4    * STAPHYLOCOCCUS AURICULARIS  Culture, blood (routine x 2)     Status: None   Collection Time: 12/06/14  8:40 PM  Result Value Ref Range Status   Specimen Description BLOOD  Final   Special Requests NONE  Final   Culture NO GROWTH 5 DAYS  Final   Report Status 12/11/2014 FINAL  Final  Culture, blood (routine x 2)     Status: None   Collection Time: 12/08/14 11:40 AM  Result Value Ref Range Status   Specimen Description BLOOD  Final   Special Requests Normal  Final   Culture NO GROWTH 6 DAYS  Final   Report Status 12/14/2014 FINAL  Final  Culture, blood (routine x 2)     Status: None   Collection Time: 12/08/14 11:49 AM  Result Value Ref Range Status   Specimen Description BLOOD  Final   Special Requests Normal  Final   Culture NO GROWTH 6 DAYS  Final   Report Status 12/14/2014 FINAL  Final  C difficile quick scan w PCR reflex ([facility] only)     Status: None   Collection Time: 12/14/14 10:11 AM  Result Value Ref Range Status   C Diff antigen NEGATIVE  Final   C Diff toxin NEGATIVE  Final   C Diff interpretation Negative for C. difficile  Final  Culture, blood (routine x  2)     Status: None (Preliminary result)   Collection Time: 12/27/14 11:28 AM  Result Value Ref Range Status   Specimen Description BLOOD  Final   Special Requests Immunocompromised  Final   Culture NO GROWTH 1 DAY  Final   Report  Status PENDING  Incomplete  Culture, blood (routine x 2)     Status: None (Preliminary result)   Collection Time: 12/27/14 11:36 AM  Result Value Ref Range Status   Specimen Description BLOOD  Final   Special Requests Immunocompromised  Final   Culture  Setup Time   Final    YEAST AEROBIC BOTTLE ONLY CRITICAL RESULT CALLED TO, READ BACK BY AND VERIFIED WITH: LISA ROMERO AT 0111 ON 12/29/14 [facility] CONFIRMED BY [place]    Culture   Final    YEAST AEROBIC BOTTLE ONLY IDENTIFICATION TO FOLLOW    Report Status PENDING  Incomplete  Cath Tip Culture     Status: None (Preliminary result)   Collection Time: 12/27/14  4:17 PM  Result Value Ref Range Status   Specimen Description CATH TIP  Final   Special Requests NONE  Final   Culture NO GROWTH <24 HRS  Final   Report Status PENDING  Incomplete    Coagulation Studies: No results for input(s): LABPROT, INR in the last 72 hours.  Urinalysis: No results for input(s): COLORURINE, LABSPEC, PHURINE, GLUCOSEU, HGBUR, BILIRUBINUR, KETONESUR, PROTEINUR, UROBILINOGEN, NITRITE, LEUKOCYTESUR in the last 72 hours.  Invalid input(s): APPERANCEUR    Imaging: Dg Chest 1 View  12/27/2014   CLINICAL DATA:  Patient with fever.  EXAM: CHEST  1 VIEW  COMPARISON:  Chest radiograph earlier same day  FINDINGS: Right anterior chest wall Port-A-Cath tip projects over the superior vena cava. Monitoring lead overlies the patient. Stable enlarged cardiac and mediastinal contours with tortuosity of the thoracic aorta. No consolidative pulmonary opacities. No pleural effusion or pneumothorax. Nonspecific band of sclerosis within the proximal right humerus, incompletely visualized.  IMPRESSION: Incompletely visualized band of sclerosis within the  proximal right humerus. Recommend correlation with dedicated radiographs of the right shoulder/ humerus for further evaluation of probable subacute/healing fracture or osseous lesion.  No acute cardiopulmonary process.   Electronically Signed   By: Lovey Newcomer M.D.   On: 12/27/2014 11:19     Medications:   . Marland KitchenTPN (CLINIMIX-E) Adult 70 mL/hr at 12/29/14 0527  . amiodarone 30 mg/hr (12/29/14 0152)   . sodium chloride   Intravenous Once  . antiseptic oral rinse  7 mL Mouth Rinse BID  . carvedilol  20 mg Oral Daily  . dronabinol  2.5 mg Oral BID AC  . feeding supplement (NEPRO CARB STEADY)  237 mL Oral BID BM  . fluconazole (DIFLUCAN) IV  100 mg Intravenous Q24H  . insulin aspart  0-9 Units Subcutaneous Q6H  . mometasone-formoterol  2 puff Inhalation BID  . pantoprazole (PROTONIX) IV  40 mg Intravenous Q12H  . piperacillin-tazobactam (ZOSYN)  IV  3.375 g Intravenous Q12H  . scopolamine  1 patch Transdermal Q72H  . Vancomycin  750 mg Intravenous Once per day on Tue Thu Sat  . ziprasidone  10 mg Intramuscular QHS   acetaminophen **OR** acetaminophen, albuterol, haloperidol lactate, heparin lock flush, lidocaine (PF), lidocaine-prilocaine, magnesium hydroxide, metoprolol, morphine injection, ondansetron (ZOFRAN) IV, promethazine, sodium chloride, sodium chloride  Assessment/ Plan:  Caleb Francis is a 78 black male with progressive mantle cell lymphoma, RICE chemo in 11/2014, hx left sided hydronephrosis and hydroureter. S/p ureteral stent placement   Hospital course complicated by sepsis with Klebsiella oxytoca, hypernatremia. Now with fungemia.   1.  Acute renal failure due to ATN: Pt has undergone multiple dialysis sessions.  Mental status does appear slightly improved after dialysis. Last hemodialysis on July 14. Nonoliguric but with poor creatinine clearance  and worsening renal failure.  - At this time, recommend placement of temp HD - have discussed with vascular surgery.  - Will need  hemodialysis treatment today. Possibly ESRD at this point.  - PermCath placement to be deferred due to active infection.   2.  Sepsis with fungemia. I41.9, B49. Status post bacteremiawith klebsiella oxytoca: completed two weeks for IV ceftazidime. Febrile again - now on fungemia and started on fluconazole.  - empiric antibiotics: zosyn, vanc - Recommend discontinuation of TPN at this time.    3.  Anemia of chronic kidney disease: status post transfusion on 7/16. Hemoglobin now 7. With worsening thrombocytopenia - plan on PRBC transfusion today.  - epo as per oncology.    LOS: [place], Melanie Openshaw 7/17/20168:45 AM

## 2014-12-29 NOTE — Progress Notes (Signed)
PARENTERAL NUTRITION CONSULT NOTE - FOLLOW UP  Pharmacy Consult for Electrolyte supplementation and glucose management Indication:TPN  No Known Allergies  Patient Measurements: Height: 6' (182.9 cm) Weight: 140 lb 11.2 oz (63.821 kg) IBW/kg (Calculated) : 77.6   Vital Signs: Temp: 100.1 F (37.8 C) (07/17 0547) Temp Source: Axillary (07/17 0547) BP: 84/52 mmHg (07/17 0547) Pulse Rate: 117 (07/17 0547) Intake/Output from previous day: 07/16 0701 - 07/17 0700 In: 267 [Blood:267] Out: 650 [Urine:650] Intake/Output from this shift:    Labs:  Recent Labs  12/27/14 0255 12/28/14 0531 12/29/14 0430  WBC 12.4* 11.5* 14.1*  HGB 7.1* 6.3* 7.0*  HCT 22.0* 19.4* 21.4*  PLT 158 106* 81*     Recent Labs  12/26/14 1203 12/27/14 0255 12/28/14 0531 12/29/14 0430  NA  --  138 137 139  K  --  4.4 4.0 3.9  CL  --  101 102 101  CO2  --  26 24 22   GLUCOSE  --  195* 290* 214*  BUN  --  44* 75* 96*  CREATININE  --  3.68* 5.33* 6.51*  LABCREA 22  --   --   --   CALCIUM  --  8.6* 7.7* 7.8*  MG  --  1.7 1.9 2.0  PHOS  --  1.9* 6.2* 7.6*  ALBUMIN  --   --  1.9*  --    Estimated Creatinine Clearance: 9.9 mL/min (by C-G formula based on Cr of 6.51).    Recent Labs  12/28/14 2241 12/29/14 0202 12/29/14 0733  GLUCAP 162* 157* 146*    Medications:  Scheduled:  . sodium chloride   Intravenous Once  . antiseptic oral rinse  7 mL Mouth Rinse BID  . carvedilol  20 mg Oral Daily  . dronabinol  2.5 mg Oral BID AC  . feeding supplement (NEPRO CARB STEADY)  237 mL Oral BID BM  . fluconazole (DIFLUCAN) IV  100 mg Intravenous Q24H  . insulin aspart  0-9 Units Subcutaneous Q6H  . mometasone-formoterol  2 puff Inhalation BID  . pantoprazole (PROTONIX) IV  40 mg Intravenous Q12H  . piperacillin-tazobactam (ZOSYN)  IV  3.375 g Intravenous Q12H  . scopolamine  1 patch Transdermal Q72H  . Vancomycin  750 mg Intravenous Once per day on Tue Thu Sat  . ziprasidone  10 mg Intramuscular  QHS   Infusions:  . Marland KitchenTPN (CLINIMIX-E) Adult 70 mL/hr at 12/29/14 0527  . amiodarone 30 mg/hr (12/29/14 0152)    Insulin Requirements in the past 24 hours:  4 units of Novolog BG ranging 129-191  Current Nutrition:  TPN 5/20% with E at 70 mL/hr, also started on diet but continued poor PO intake- per RD will continue TPN until eating an adequate amount.  Marinol added for appetite stimulation.   Assessment: Patient is a 69 yo male admitted with mantle cell lymphoma and sepsis.  Pharmacy consulted to manage electrolytes and glucose while receiving TPN.   Electrolytes within normal limits except low phos  Plan:  Will not order scheduled insulin at this time.  No electrolyte supplementation required at this time. Phosphorus elevated again today. Patient originally had dialysis scheduled for yesterday per RN on yesterday; however patient did not receive hemodialysis.   Will recheck labs in AM.  Pharmacy will continue to follow.  Larene Beach, PharmD Clinical Pharmacist   12/29/2014 8:55 AM

## 2014-12-29 NOTE — Progress Notes (Signed)
ANTIBIOTIC CONSULT NOTE - FOLLOW UP   Pharmacy Consult for Vancomycin/Zosyn Indication: rule out sepsis/ Fever/ ?aspiration PNA  No Known Allergies  Patient Measurements: Height: 6' (182.9 cm) Weight: 140 lb 11.2 oz (63.821 kg) IBW/kg (Calculated) : 77.6 Adjusted Body Weight:   Vital Signs: Temp: 100.1 F (37.8 C) (07/17 0547) Temp Source: Axillary (07/17 0547) BP: 84/52 mmHg (07/17 0547) Pulse Rate: 117 (07/17 0547) Intake/Output from previous day: 07/16 0701 - 07/17 0700 In: 267 [Blood:267] Out: 650 [Urine:650] Intake/Output from this shift:    Labs:  Recent Labs  12/26/14 1203  12/27/14 0255 12/28/14 0531 12/29/14 0430  WBC  --   --  12.4* 11.5* 14.1*  HGB  --   < > 7.1* 6.3* 7.0*  PLT  --   --  158 106* 81*  LABCREA 22  --   --   --   --   CREATININE  --   --  3.68* 5.33* 6.51*  < > = values in this interval not displayed. Estimated Creatinine Clearance: 9.9 mL/min (by C-G formula based on Cr of 6.51).   Microbiology: Recent Results (from the past 720 hour(s))  Culture, blood (single)     Status: None   Collection Time: 11/29/14 11:09 AM  Result Value Ref Range Status   Specimen Description BLOOD  Final   Special Requests NONE  Final   Culture  Setup Time   Final    GRAM NEGATIVE RODS AEROBIC BOTTLE ONLY CRITICAL RESULT CALLED TO, READ BACK BY AND VERIFIED WITH: SANDRA VORBA ON 11/30/14 AT 0825 BY JEF    Culture KLEBSIELLA OXYTOCA AEROBIC BOTTLE ONLY   Final   Report Status 12/04/2014 FINAL  Final   Organism ID, Bacteria KLEBSIELLA OXYTOCA  Final      Susceptibility   Klebsiella oxytoca - MIC*    AMPICILLIN >=32 RESISTANT Resistant     CEFTAZIDIME <=1 SENSITIVE Sensitive     CEFAZOLIN >=64 RESISTANT Resistant     CEFTRIAXONE <=1 SENSITIVE Sensitive     CIPROFLOXACIN <=0.25 SENSITIVE Sensitive     GENTAMICIN <=1 SENSITIVE Sensitive     IMIPENEM <=0.25 SENSITIVE Sensitive     TRIMETH/SULFA <=20 SENSITIVE Sensitive     CEFOXITIN <=4 SENSITIVE  Sensitive     PIP/TAZO Value in next row Resistant      RESISTANT128    * KLEBSIELLA OXYTOCA  Urine culture     Status: None   Collection Time: 11/29/14 11:22 AM  Result Value Ref Range Status   Specimen Description URINE, CLEAN CATCH  Final   Special Requests NONE  Final   Culture >=100,000 COLONIES/mL KLEBSIELLA OXYTOCA  Final   Report Status 12/01/2014 FINAL  Final   Organism ID, Bacteria KLEBSIELLA OXYTOCA  Final      Susceptibility   Klebsiella oxytoca - MIC*    AMPICILLIN >=32 RESISTANT Resistant     CEFAZOLIN >=64 RESISTANT Resistant     CEFTRIAXONE 4 SENSITIVE Sensitive     CIPROFLOXACIN <=0.25 SENSITIVE Sensitive     GENTAMICIN <=1 SENSITIVE Sensitive     IMIPENEM <=0.25 SENSITIVE Sensitive     NITROFURANTOIN <=16 SENSITIVE Sensitive     TRIMETH/SULFA <=20 SENSITIVE Sensitive     CEFOXITIN <=4 SENSITIVE Sensitive     * >=100,000 COLONIES/mL KLEBSIELLA OXYTOCA  MRSA PCR Screening     Status: None   Collection Time: 11/29/14 12:13 PM  Result Value Ref Range Status   MRSA by PCR NEGATIVE NEGATIVE Final    Comment:  The GeneXpert MRSA Assay (FDA approved for NASAL specimens only), is one component of a comprehensive MRSA colonization surveillance program. It is not intended to diagnose MRSA infection nor to guide or monitor treatment for MRSA infections.   Culture, blood (routine x 2)     Status: None   Collection Time: 11/29/14  1:19 PM  Result Value Ref Range Status   Specimen Description BLOOD  Final   Special Requests Immunocompromised  Final   Culture NO GROWTH 5 DAYS  Final   Report Status 12/04/2014 FINAL  Final  Urine culture     Status: None   Collection Time: 11/30/14 10:12 AM  Result Value Ref Range Status   Specimen Description URINE, CATHETERIZED  Final   Special Requests Immunocompromised  Final   Culture NO GROWTH 2 DAYS  Final   Report Status 12/02/2014 FINAL  Final  Culture, blood (routine x 2)     Status: None   Collection Time:  12/06/14  8:08 PM  Result Value Ref Range Status   Specimen Description BLOOD  Final   Special Requests NONE  Final   Culture  Setup Time   Final    GRAM POSITIVE COCCI IN BOTH AEROBIC AND ANAEROBIC BOTTLES CRITICAL RESULT CALLED TO, READ BACK BY AND VERIFIED WITH: JENNIFER BEZARD AT 1610 12/08/14.PMH CONFIRMED BY [facility]    Culture   Final    STAPHYLOCOCCUS AURICULARIS IN BOTH AEROBIC AND ANAEROBIC BOTTLES    Report Status 12/11/2014 FINAL  Final   Organism ID, Bacteria STAPHYLOCOCCUS AURICULARIS  Final      Susceptibility   Staphylococcus auricularis - MIC*    CIPROFLOXACIN >=8 RESISTANT Resistant     ERYTHROMYCIN >=8 RESISTANT Resistant     GENTAMICIN <=0.5 SENSITIVE Sensitive     OXACILLIN >=4 RESISTANT Resistant     TETRACYCLINE <=1 SENSITIVE Sensitive     VANCOMYCIN 1 SENSITIVE Sensitive     CLINDAMYCIN <=0.25 SENSITIVE Sensitive     TRIMETH/SULFA Value in next row Sensitive      SENSITIVE<=20    LEVOFLOXACIN Value in next row Intermediate      INTERMEDIATE4    * STAPHYLOCOCCUS AURICULARIS  Culture, blood (routine x 2)     Status: None   Collection Time: 12/06/14  8:40 PM  Result Value Ref Range Status   Specimen Description BLOOD  Final   Special Requests NONE  Final   Culture NO GROWTH 5 DAYS  Final   Report Status 12/11/2014 FINAL  Final  Culture, blood (routine x 2)     Status: None   Collection Time: 12/08/14 11:40 AM  Result Value Ref Range Status   Specimen Description BLOOD  Final   Special Requests Normal  Final   Culture NO GROWTH 6 DAYS  Final   Report Status 12/14/2014 FINAL  Final  Culture, blood (routine x 2)     Status: None   Collection Time: 12/08/14 11:49 AM  Result Value Ref Range Status   Specimen Description BLOOD  Final   Special Requests Normal  Final   Culture NO GROWTH 6 DAYS  Final   Report Status 12/14/2014 FINAL  Final  C difficile quick scan w PCR reflex ([facility] only)     Status: None   Collection Time: 12/14/14 10:11 AM  Result Value  Ref Range Status   C Diff antigen NEGATIVE  Final   C Diff toxin NEGATIVE  Final   C Diff interpretation Negative for C. difficile  Final  Culture, blood (routine x  2)     Status: None (Preliminary result)   Collection Time: 12/27/14 11:28 AM  Result Value Ref Range Status   Specimen Description BLOOD  Final   Special Requests Immunocompromised  Final   Culture NO GROWTH 1 DAY  Final   Report Status PENDING  Incomplete  Culture, blood (routine x 2)     Status: None (Preliminary result)   Collection Time: 12/27/14 11:36 AM  Result Value Ref Range Status   Specimen Description BLOOD  Final   Special Requests Immunocompromised  Final   Culture  Setup Time   Final    YEAST AEROBIC BOTTLE ONLY CRITICAL RESULT CALLED TO, READ BACK BY AND VERIFIED WITH: LISA ROMERO AT 0111 ON 12/29/14 [facility] CONFIRMED BY [place]    Culture   Final    YEAST AEROBIC BOTTLE ONLY IDENTIFICATION TO FOLLOW    Report Status PENDING  Incomplete  Cath Tip Culture     Status: None (Preliminary result)   Collection Time: 12/27/14  4:17 PM  Result Value Ref Range Status   Specimen Description CATH TIP  Final   Special Requests NONE  Final   Culture NO GROWTH <24 HRS  Final   Report Status PENDING  Incomplete    Medical History: Past Medical History  Diagnosis Date  . Arthritis   . Hypertension   . RA (rheumatoid arthritis)   . Anemia   . Mantle cell lymphoma     Dr Mike Gip  . History of nuclear stress test     a. 12/2013: low risk, no sig ischemia, no EKG changes, no artifact, EF 63%  . Chronic kidney disease   . Sepsis     Dr Ola Spurr    Medications:  Scheduled:  . sodium chloride   Intravenous Once  . antiseptic oral rinse  7 mL Mouth Rinse BID  . carvedilol  20 mg Oral Daily  . dronabinol  2.5 mg Oral BID AC  . feeding supplement (NEPRO CARB STEADY)  237 mL Oral BID BM  . fluconazole (DIFLUCAN) IV  100 mg Intravenous Q24H  . insulin aspart  0-9 Units Subcutaneous Q6H  . mometasone-formoterol  2  puff Inhalation BID  . pantoprazole (PROTONIX) IV  40 mg Intravenous Q12H  . piperacillin-tazobactam (ZOSYN)  IV  3.375 g Intravenous Q12H  . scopolamine  1 patch Transdermal Q72H  . Vancomycin  750 mg Intravenous Once per day on Tue Thu Sat  . ziprasidone  10 mg Intramuscular QHS   Anti-infectives    Start     Dose/Rate Route Frequency Ordered Stop   12/28/14 1200  vancomycin (VANCOCIN) IVPB 750 mg/150 ml premix     750 mg 150 mL/hr over 60 Minutes Intravenous Once per day on Tue Thu Sat 12/27/14 1210     12/27/14 1800  fluconazole (DIFLUCAN) IVPB 200 mg  Status:  Discontinued     200 mg 100 mL/hr over 60 Minutes Intravenous Every 24 hours 12/27/14 1402 12/27/14 1404   12/27/14 1800  fluconazole (DIFLUCAN) IVPB 100 mg     100 mg 50 mL/hr over 60 Minutes Intravenous Every 24 hours 12/27/14 1404     12/27/14 1400  piperacillin-tazobactam (ZOSYN) IVPB 3.375 g  Status:  Discontinued     3.375 g 12.5 mL/hr over 240 Minutes Intravenous 3 times per day 12/27/14 1049 12/27/14 1210   12/27/14 1300  vancomycin (VANCOCIN) 1,500 mg in sodium chloride 0.9 % 500 mL IVPB     1,500 mg 250 mL/hr over 120 Minutes  Intravenous  Once 12/27/14 1210 12/27/14 1521   12/27/14 1230  piperacillin-tazobactam (ZOSYN) IVPB 3.375 g     3.375 g 12.5 mL/hr over 240 Minutes Intravenous Every 12 hours 12/27/14 1210     12/27/14 1100  vancomycin (VANCOCIN) powder 1,000 mg  Status:  Discontinued     1,000 mg Other To Surgery 12/27/14 1049 12/27/14 1119   12/27/14 1100  vancomycin (VANCOCIN) IVPB 1000 mg/200 mL premix  Status:  Discontinued     1,000 mg 200 mL/hr over 60 Minutes Intravenous Every 24 hours 12/27/14 1049 12/27/14 1210   12/27/14 0000  ceFAZolin (ANCEF) IVPB 1 g/50 mL premix    Comments:  Send with pt to OR   1 g 100 mL/hr over 30 Minutes Intravenous On call 12/26/14 2208 12/28/14 0000   12/19/14 1800  vancomycin (VANCOCIN) IVPB 750 mg/150 ml premix     750 mg 150 mL/hr over 60 Minutes Intravenous   Once 12/19/14 1515 12/19/14 1940   12/15/14 0630  vancomycin (VANCOCIN) IVPB 750 mg/150 ml premix     750 mg 150 mL/hr over 60 Minutes Intravenous  Once 12/15/14 0616 12/15/14 0726   12/14/14 1300  vancomycin (VANCOCIN) IVPB 750 mg/150 ml premix  Status:  Discontinued     750 mg 150 mL/hr over 60 Minutes Intravenous Every 48 hours 12/13/14 1107 12/14/14 0604   12/14/14 0603  vancomycin (VANCOCIN) IVPB 750 mg/150 ml premix  Status:  Discontinued     750 mg 150 mL/hr over 60 Minutes Intravenous Daily PRN 12/14/14 0604 12/21/14 1110   12/09/14 0900  erythromycin 250 mg in sodium chloride 0.9 % 100 mL IVPB  Status:  Discontinued     250 mg 100 mL/hr over 60 Minutes Intravenous 3 times per day 12/09/14 0859 12/13/14 1054   12/08/14 1100  metroNIDAZOLE (FLAGYL) IVPB 500 mg  Status:  Discontinued     500 mg 100 mL/hr over 60 Minutes Intravenous Every 8 hours 12/08/14 0958 12/11/14 1048   12/08/14 0900  vancomycin (VANCOCIN) IVPB 750 mg/150 ml premix  Status:  Discontinued     750 mg 150 mL/hr over 60 Minutes Intravenous Every 24 hours 12/07/14 1102 12/12/14 0955   12/07/14 1030  cefTAZidime (FORTAZ) 2 g in dextrose 5 % 50 mL IVPB  Status:  Discontinued     2 g 100 mL/hr over 30 Minutes Intravenous Every 24 hours 12/07/14 1021 12/11/14 1048   12/07/14 0700  vancomycin (VANCOCIN) IVPB 1000 mg/200 mL premix  Status:  Discontinued     1,000 mg 200 mL/hr over 60 Minutes Intravenous Every 24 hours 12/06/14 2225 12/07/14 1102   12/06/14 2200  piperacillin-tazobactam (ZOSYN) IVPB 4.5 g  Status:  Discontinued     4.5 g 200 mL/hr over 30 Minutes Intravenous 3 times per day 12/06/14 1946 12/07/14 1011   12/06/14 2000  vancomycin (VANCOCIN) IVPB 1000 mg/200 mL premix     1,000 mg 200 mL/hr over 60 Minutes Intravenous STAT 12/06/14 1947 12/06/14 2113   12/06/14 2000  cefTRIAXone (ROCEPHIN) 1 g in dextrose 5 % 50 mL IVPB - Premix  Status:  Discontinued     1 g 100 mL/hr over 30 Minutes Intravenous Every  24 hours 12/06/14 1959 12/07/14 1011   12/06/14 1945  piperacillin-tazobactam (ZOSYN) IVPB 3.375 g  Status:  Discontinued     3.375 g 100 mL/hr over 30 Minutes Intravenous 4 times per day 12/06/14 1940 12/06/14 2007   12/06/14 1945  vancomycin (VANCOCIN) IVPB 1000 mg/200 mL premix  Status:  Discontinued     1,000 mg 200 mL/hr over 60 Minutes Intravenous Every 12 hours 12/06/14 1940 12/06/14 2008   12/01/14 0945  piperacillin-tazobactam (ZOSYN) IVPB 3.375 g  Status:  Discontinued     3.375 g 12.5 mL/hr over 240 Minutes Intravenous 3 times per day 12/01/14 0936 12/03/14 1337   11/30/14 1000  cefTRIAXone (ROCEPHIN) 2 g in dextrose 5 % 50 mL IVPB - Premix  Status:  Discontinued    Comments:  Entered per MD progress note   2 g 100 mL/hr over 30 Minutes Intravenous Every 24 hours 11/29/14 2157 12/06/14 1940   11/29/14 1415  cefTRIAXone (ROCEPHIN) 1 g in dextrose 5 % 50 mL IVPB - Premix     1 g 100 mL/hr over 30 Minutes Intravenous  Once 11/29/14 1406 11/29/14 1452   11/29/14 1414  cefTRIAXone (ROCEPHIN) 20 MG/ML IVPB 50 mL    Comments:  MONAR, NELLIE: cabinet override      11/29/14 1414 11/29/14 1424     Assessment: 69 yo male with mantle cell lymphoma on TPN, just started eating per MD note, now with fever, possible sepsis,?aspiration PNA,bacteremia. Patient on Hemodialysis( doesn't appear scheduled).   7/17: Spoke with RN on 7/16 and she stated patient was on schedule to receive hemodialysis on that day; however patient did not receive HD but did receive Vancomycin dose per MAR.   Per Nephrology patient to receive HD on 7/18.   Goal of Therapy:  Vancomycin trough level 15-20 mcg/ml  F/U cultures.  Plan:  Measure antibiotic drug levels at steady state  Will give Vancomycin 1500 mg IV x 1 as Loading dose in Hemodialysis patient. Will order vancomycin 750mg  IV as maintenance  Dose- Will change next maintenance dose to be given on 7/18. Follow closely as dialysis may change. Plan to order  trough prior to 3rd dialysis session. Will change Zosyn to 3.375gm IV Q12h.  Larene Beach, PharmD  Clinical Pharmacist 12/29/2014

## 2014-12-30 DIAGNOSIS — C8590 Non-Hodgkin lymphoma, unspecified, unspecified site: Secondary | ICD-10-CM

## 2014-12-30 DIAGNOSIS — Z960 Presence of urogenital implants: Secondary | ICD-10-CM

## 2014-12-30 DIAGNOSIS — N186 End stage renal disease: Secondary | ICD-10-CM

## 2014-12-30 DIAGNOSIS — Z95828 Presence of other vascular implants and grafts: Secondary | ICD-10-CM

## 2014-12-30 DIAGNOSIS — Z992 Dependence on renal dialysis: Secondary | ICD-10-CM

## 2014-12-30 LAB — BASIC METABOLIC PANEL
ANION GAP: 13 (ref 5–15)
BUN: 52 mg/dL — AB (ref 6–20)
CALCIUM: 7.5 mg/dL — AB (ref 8.9–10.3)
CHLORIDE: 98 mmol/L — AB (ref 101–111)
CO2: 28 mmol/L (ref 22–32)
CREATININE: 4.49 mg/dL — AB (ref 0.61–1.24)
GFR calc Af Amer: 14 mL/min — ABNORMAL LOW (ref >60)
GFR calc non Af Amer: 12 mL/min — ABNORMAL LOW (ref >60)
GLUCOSE: 92 mg/dL (ref 65–99)
Potassium: 3.6 mmol/L (ref 3.5–5.1)
Sodium: 139 mmol/L (ref 135–145)

## 2014-12-30 LAB — GLUCOSE, CAPILLARY
GLUCOSE-CAPILLARY: 108 mg/dL — AB (ref 65–99)
GLUCOSE-CAPILLARY: 123 mg/dL — AB (ref 65–99)
Glucose-Capillary: 84 mg/dL (ref 65–99)
Glucose-Capillary: 116 mg/dL — ABNORMAL HIGH (ref 65–99)
Glucose-Capillary: 95 mg/dL (ref 65–99)

## 2014-12-30 LAB — CBC
HCT: 22.8 % — ABNORMAL LOW (ref 40.0–52.0)
HEMOGLOBIN: 7.8 g/dL — AB (ref 13.0–18.0)
MCH: 28.9 pg (ref 26.0–34.0)
MCHC: 34.3 g/dL (ref 32.0–36.0)
MCV: 84.4 fL (ref 80.0–100.0)
PLATELETS: 69 10*3/uL — AB (ref 150–440)
RBC: 2.7 MIL/uL — AB (ref 4.40–5.90)
RDW: 18.5 % — ABNORMAL HIGH (ref 11.5–14.5)
WBC: 12 10*3/uL — ABNORMAL HIGH (ref 3.8–10.6)

## 2014-12-30 LAB — TYPE AND SCREEN
ABO/RH(D): O POS
ABO/RH(D): O POS
ANTIBODY SCREEN: NEGATIVE
Antibody Screen: NEGATIVE
UNIT DIVISION: 0
Unit division: 0

## 2014-12-30 LAB — CATH TIP CULTURE: Culture: NO GROWTH

## 2014-12-30 MED ORDER — ENSURE ENLIVE PO LIQD
237.0000 mL | Freq: Two times a day (BID) | ORAL | Status: DC
  Administered 2015-01-01 – 2015-01-04 (×4): 237 mL via ORAL

## 2014-12-30 MED ORDER — VANCOMYCIN HCL IN DEXTROSE 750-5 MG/150ML-% IV SOLN
750.0000 mg | INTRAVENOUS | Status: DC | PRN
  Filled 2014-12-30: qty 150

## 2014-12-30 MED ORDER — VANCOMYCIN HCL IN DEXTROSE 750-5 MG/150ML-% IV SOLN: 750.0000 mg | INTRAVENOUS | Status: DC

## 2014-12-30 NOTE — Care Management (Signed)
Insurance auth still pending for [employer].  Patient found to have new fungal infection in blood.  Temporary dialysis cath was replaced and the tip of the cath was cultured and so far- no growth..  There is discussion that patient port will be removed but this is not confirmed.

## 2014-12-30 NOTE — Progress Notes (Signed)
Speech Language Pathology Treatment: Dysphagia  Patient Details Name: Caleb Francis MRN: [id number] DOB: 12-Jun-1946 Today's Date: 12/30/2014 Time: 8469-6295 SLP Time Calculation (min) (ACUTE ONLY): 46 min  Assessment / Plan / Recommendation Clinical Impression  Pt appeared to safely tolerate trials of thin liquids, purees and soft solids cut small(pancake and peaches) w/ no overt s/s of aspiration noted; no change in respiratory status. Vocal quality remained clear b/t trials. Pt consumed ~8 ozs of liquids via cup and straw but ONLY 3 (1/2) tsp boluses of soft food cut for him. He exhibited moderate oral phase deficits c/b the increased oral time w/ prolonged mastication and increased mastication effort of the boluses; pt appeared to roll the boluses around in the mouth vs masticating the food boluses.  Pt appears at reduced risk for aspiration w/ a Dys. 2 (for easier mastication) and thin liquids w/ general aspiration precautions; meds in puree; supplements as ordered. ST will f/u w/ toleration of diet next 1-3 days.     HPI Other Pertinent Information: Pt is a is a 69 y.o. 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 He presented for chemotherapy appt. During his chemotherapy infusion he became acutely confused and also complained of chest pain. Pt became septic w/ colonic Ileus with GI bleed: Patient is no longer neutropenic. Clinically improving per MD. NG tube and rectal tube have been discontinued; pt receiving TPN. Pt evaluated for, and placed on, a pureed diet w/ thin liquids initially then was upgraded to a Dys. 2 w/ thin liquids. Pt was tolerating this diet but taking less food but drinking all of his drinks per report. Pt was NPO  for a procedure on Friday and continues his NPO status at this time per Dietician report. MD requested assessment of pt to begin the diet again. Pt continues to be easily distracted and interacts briefly w/ others in his responses.    Pertinent Vitals Pain Assessment: No/denies pain  SLP Plan  Continue with current plan of care    Recommendations Diet recommendations: Dysphagia 2 (fine chop);Thin liquid Liquids provided via: Cup;Straw Medication Administration: Whole meds with puree Supervision: Staff to assist with self feeding;Full supervision/cueing for compensatory strategies Compensations: Slow rate;Small sips/bites (check for oral clearing) Postural Changes and/or Swallow Maneuvers: Seated upright 90 degrees              General recommendations:  (Dietician) Oral Care Recommendations: Oral care BID;Oral care before and after PO;Staff/trained caregiver to provide oral care Follow up Recommendations: Skilled Nursing facility Plan: Continue with current plan of care    GO     [facility] 12/30/2014, 10:57 AM

## 2014-12-30 NOTE — Plan of Care (Signed)
Problem: Phase II Progression Outcomes Goal: Other Phase II Outcomes/Goals Outcome: Progressing Pt is alert with confusion, denies pain, bm throughout shift, foley in place, bed ridden, diet advanced, poor appetite, patient continues to have low grade fever, hypotensive, continues on IV fluids and IV antibiotics, blood sugar controlled, wbc improving, update given to patients wife via phone, dialysis anticipated for 7/19. Uneventful shift.

## 2014-12-30 NOTE — Progress Notes (Signed)
INFECTIOUS DISEASE PROGRESS NOTE  ID: Caleb Francis is a 69 y.o. male with  Principal Problem:   Fungemia Active Problems:   Mantle cell lymphoma   Sepsis   Thrombocytopenia   Absolute anemia   Rheumatoid arthritis with rheumatoid factor   Altered mental status   Chest pain   Metabolic encephalopathy   Ileus   Fecal impaction   Protein-calorie malnutrition, severe   Ogilvie's syndrome   Pseudo-obstruction of colon   Pressure ulcer   Acute renal insufficiency  Subjective: (remote eval)  Abtx:  Anti-infectives    Start     Dose/Rate Route Frequency Ordered Stop   12/31/14 0000  vancomycin (VANCOCIN) IVPB 750 mg/150 ml premix  Status:  Discontinued     750 mg 150 mL/hr over 60 Minutes Intravenous Once per day on Mon Thu Sat 12/30/14 1126 12/30/14 1131   12/30/14 1200  vancomycin (VANCOCIN) IVPB 750 mg/150 ml premix  Status:  Discontinued     750 mg 150 mL/hr over 60 Minutes Intravenous Once per day on Mon Thu Sat 12/29/14 0903 12/30/14 1126   12/30/14 1129  vancomycin (VANCOCIN) IVPB 750 mg/150 ml premix  Status:  Discontinued    Comments:  Administer dose during last hour of dialysis as long as pre-dialysis random level is less then 20 mcg/mL   750 mg 150 mL/hr over 60 Minutes Intravenous Every Dialysis 12/30/14 1131 12/30/14 1527   12/29/14 1130  micafungin (MYCAMINE) 100 mg in sodium chloride 0.9 % 100 mL IVPB     100 mg 100 mL/hr over 1 Hours Intravenous Daily 12/29/14 1117     12/28/14 1200  vancomycin (VANCOCIN) IVPB 750 mg/150 ml premix  Status:  Discontinued     750 mg 150 mL/hr over 60 Minutes Intravenous Once per day on Tue Thu Sat 12/27/14 1210 12/29/14 0903   12/27/14 1800  fluconazole (DIFLUCAN) IVPB 200 mg  Status:  Discontinued     200 mg 100 mL/hr over 60 Minutes Intravenous Every 24 hours 12/27/14 1402 12/27/14 1404   12/27/14 1800  fluconazole (DIFLUCAN) IVPB 100 mg  Status:  Discontinued     100 mg 50 mL/hr over 60 Minutes Intravenous Every 24  hours 12/27/14 1404 12/29/14 1117   12/27/14 1400  piperacillin-tazobactam (ZOSYN) IVPB 3.375 g  Status:  Discontinued     3.375 g 12.5 mL/hr over 240 Minutes Intravenous 3 times per day 12/27/14 1049 12/27/14 1210   12/27/14 1300  vancomycin (VANCOCIN) 1,500 mg in sodium chloride 0.9 % 500 mL IVPB     1,500 mg 250 mL/hr over 120 Minutes Intravenous  Once 12/27/14 1210 12/27/14 1521   12/27/14 1230  piperacillin-tazobactam (ZOSYN) IVPB 3.375 g  Status:  Discontinued     3.375 g 12.5 mL/hr over 240 Minutes Intravenous Every 12 hours 12/27/14 1210 12/30/14 1527   12/27/14 1100  vancomycin (VANCOCIN) powder 1,000 mg  Status:  Discontinued     1,000 mg Other To Surgery 12/27/14 1049 12/27/14 1119   12/27/14 1100  vancomycin (VANCOCIN) IVPB 1000 mg/200 mL premix  Status:  Discontinued     1,000 mg 200 mL/hr over 60 Minutes Intravenous Every 24 hours 12/27/14 1049 12/27/14 1210   12/27/14 0000  ceFAZolin (ANCEF) IVPB 1 g/50 mL premix    Comments:  Send with pt to OR   1 g 100 mL/hr over 30 Minutes Intravenous On call 12/26/14 2208 12/28/14 0000   12/19/14 1800  vancomycin (VANCOCIN) IVPB 750 mg/150 ml premix  750 mg 150 mL/hr over 60 Minutes Intravenous  Once 12/19/14 1515 12/19/14 1940   12/15/14 0630  vancomycin (VANCOCIN) IVPB 750 mg/150 ml premix     750 mg 150 mL/hr over 60 Minutes Intravenous  Once 12/15/14 0616 12/15/14 0726   12/14/14 1300  vancomycin (VANCOCIN) IVPB 750 mg/150 ml premix  Status:  Discontinued     750 mg 150 mL/hr over 60 Minutes Intravenous Every 48 hours 12/13/14 1107 12/14/14 0604   12/14/14 0603  vancomycin (VANCOCIN) IVPB 750 mg/150 ml premix  Status:  Discontinued     750 mg 150 mL/hr over 60 Minutes Intravenous Daily PRN 12/14/14 0604 12/21/14 1110   12/09/14 0900  erythromycin 250 mg in sodium chloride 0.9 % 100 mL IVPB  Status:  Discontinued     250 mg 100 mL/hr over 60 Minutes Intravenous 3 times per day 12/09/14 0859 12/13/14 1054   12/08/14 1100   metroNIDAZOLE (FLAGYL) IVPB 500 mg  Status:  Discontinued     500 mg 100 mL/hr over 60 Minutes Intravenous Every 8 hours 12/08/14 0958 12/11/14 1048   12/08/14 0900  vancomycin (VANCOCIN) IVPB 750 mg/150 ml premix  Status:  Discontinued     750 mg 150 mL/hr over 60 Minutes Intravenous Every 24 hours 12/07/14 1102 12/12/14 0955   12/07/14 1030  cefTAZidime (FORTAZ) 2 g in dextrose 5 % 50 mL IVPB  Status:  Discontinued     2 g 100 mL/hr over 30 Minutes Intravenous Every 24 hours 12/07/14 1021 12/11/14 1048   12/07/14 0700  vancomycin (VANCOCIN) IVPB 1000 mg/200 mL premix  Status:  Discontinued     1,000 mg 200 mL/hr over 60 Minutes Intravenous Every 24 hours 12/06/14 2225 12/07/14 1102   12/06/14 2200  piperacillin-tazobactam (ZOSYN) IVPB 4.5 g  Status:  Discontinued     4.5 g 200 mL/hr over 30 Minutes Intravenous 3 times per day 12/06/14 1946 12/07/14 1011   12/06/14 2000  vancomycin (VANCOCIN) IVPB 1000 mg/200 mL premix     1,000 mg 200 mL/hr over 60 Minutes Intravenous STAT 12/06/14 1947 12/06/14 2113   12/06/14 2000  cefTRIAXone (ROCEPHIN) 1 g in dextrose 5 % 50 mL IVPB - Premix  Status:  Discontinued     1 g 100 mL/hr over 30 Minutes Intravenous Every 24 hours 12/06/14 1959 12/07/14 1011   12/06/14 1945  piperacillin-tazobactam (ZOSYN) IVPB 3.375 g  Status:  Discontinued     3.375 g 100 mL/hr over 30 Minutes Intravenous 4 times per day 12/06/14 1940 12/06/14 2007   12/06/14 1945  vancomycin (VANCOCIN) IVPB 1000 mg/200 mL premix  Status:  Discontinued     1,000 mg 200 mL/hr over 60 Minutes Intravenous Every 12 hours 12/06/14 1940 12/06/14 2008   12/01/14 0945  piperacillin-tazobactam (ZOSYN) IVPB 3.375 g  Status:  Discontinued     3.375 g 12.5 mL/hr over 240 Minutes Intravenous 3 times per day 12/01/14 0936 12/03/14 1337   11/30/14 1000  cefTRIAXone (ROCEPHIN) 2 g in dextrose 5 % 50 mL IVPB - Premix  Status:  Discontinued    Comments:  Entered per MD progress note   2 g 100 mL/hr  over 30 Minutes Intravenous Every 24 hours 11/29/14 2157 12/06/14 1940   11/29/14 1415  cefTRIAXone (ROCEPHIN) 1 g in dextrose 5 % 50 mL IVPB - Premix     1 g 100 mL/hr over 30 Minutes Intravenous  Once 11/29/14 1406 11/29/14 1452   11/29/14 1414  cefTRIAXone (ROCEPHIN) 20 MG/ML IVPB 50  mL    Comments:  MONAR, NELLIE: cabinet override      11/29/14 1414 11/29/14 1424      Medications:  Scheduled: . sodium chloride   Intravenous Once  . sodium chloride   Intravenous Once  . antiseptic oral rinse  7 mL Mouth Rinse BID  . carvedilol  20 mg Oral Daily  . dronabinol  2.5 mg Oral BID AC  . feeding supplement (NEPRO CARB STEADY)  237 mL Oral BID BM  . insulin aspart  0-9 Units Subcutaneous Q6H  . micafungin (MYCAMINE) IV  100 mg Intravenous Daily  . mometasone-formoterol  2 puff Inhalation BID  . pantoprazole (PROTONIX) IV  40 mg Intravenous Q12H  . scopolamine  1 patch Transdermal Q72H  . ziprasidone  10 mg Intramuscular QHS    Objective: Vital signs in last 24 hours: Temp:  [98.3 F (36.8 C)-99.2 F (37.3 C)] 99.2 F (37.3 C) (07/18 1130) Pulse Rate:  [100-127] 100 (07/18 1406) Resp:  [16-22] 20 (07/18 1130) BP: (74-156)/(44-133) 74/46 mmHg (07/18 1406) SpO2:  [99 %-100 %] 99 % (07/18 0602) Weight:  [63.231 kg (139 lb 6.4 oz)-66.497 kg (146 lb 9.6 oz)] 66.497 kg (146 lb 9.6 oz) (07/18 0602)   none (remote)  Lab Results  Recent Labs  12/29/14 0430 12/30/14 0445  WBC 14.1* 12.0*  HGB 7.0* 7.8*  HCT 21.4* 22.8*  NA 139 139  K 3.9 3.6  CL 101 98*  CO2 22 28  BUN 96* 52*  CREATININE 6.51* 4.49*   Liver Panel  Recent Labs  12/28/14 0531  ALBUMIN 1.9*   Sedimentation Rate No results for input(s): ESRSEDRATE in the last 72 hours. C-Reactive Protein No results for input(s): CRP in the last 72 hours.  Microbiology: Recent Results (from the past 240 hour(s))  Culture, blood (routine x 2)     Status: None (Preliminary result)   Collection Time: 12/27/14 11:28 AM   Result Value Ref Range Status   Specimen Description BLOOD  Final   Special Requests Immunocompromised  Final   Culture  Setup Time   Final    YEAST AEROBIC BOTTLE ONLY CRITICAL RESULT CALLED TO, READ BACK BY AND VERIFIED WITH: CATHY SUMMERLIN ON 12/29/14 AT 1000AM BY JEF    Culture   Final    YEAST AEROBIC BOTTLE ONLY IDENTIFICATION TO FOLLOW    Report Status PENDING  Incomplete  Culture, blood (routine x 2)     Status: None (Preliminary result)   Collection Time: 12/27/14 11:36 AM  Result Value Ref Range Status   Specimen Description BLOOD  Final   Special Requests Immunocompromised  Final   Culture  Setup Time   Final    YEAST AEROBIC BOTTLE ONLY CRITICAL RESULT CALLED TO, READ BACK BY AND VERIFIED WITH: LISA ROMERO AT 0111 ON 12/29/14 [facility] CONFIRMED BY [place]    Culture   Final    YEAST AEROBIC BOTTLE ONLY IDENTIFICATION TO FOLLOW    Report Status PENDING  Incomplete  Cath Tip Culture     Status: None   Collection Time: 12/27/14  4:17 PM  Result Value Ref Range Status   Specimen Description CATH TIP  Final   Special Requests NONE  Final   Culture NO GROWTH 2 DAYS  Final   Report Status 12/30/2014 FINAL  Final  Culture, blood (routine x 2)     Status: None (Preliminary result)   Collection Time: 12/29/14 12:28 PM  Result Value Ref Range Status   Specimen Description BLOOD  RIGHT ARM  Final   Special Requests   Final    BOTTLES DRAWN AEROBIC AND ANAEROBIC  AER 4CC ANA 3CC   Culture NO GROWTH < 24 HOURS  Final   Report Status PENDING  Incomplete  Culture, blood (routine x 2)     Status: None (Preliminary result)   Collection Time: 12/29/14 12:28 PM  Result Value Ref Range Status   Specimen Description BLOOD RIGHT FATTY CASTS  Final   Special Requests BOTTLES DRAWN AEROBIC AND ANAEROBIC  5CC  Final   Culture NO GROWTH < 24 HOURS  Final   Report Status PENDING  Incomplete    Studies/Results: No results found.   Assessment/Plan: Fungemia Port Lymphoma Ureteral  Stent ESRD  R groin HD line placed 7-17 Total days of antibiotics: 2 micafungin  Await repeat BCx Await ID of yeast Pull port Not sure if he will clear HD line from fungemia, will watch.          Bobby Rumpf Infectious Diseases (pager) [phone] [email or web address] 12/30/2014, 3:51 PM  LOS: 31 days

## 2014-12-30 NOTE — Progress Notes (Signed)
Nutrition Follow-up  DOCUMENTATION CODES:   Severe malnutrition in context of acute illness/injury  INTERVENTION:   1) Coordination of Care: discussed nutritional poc with MD Gouru; calorie count over the weekend not appropriate as pt NPO all weekend. Continue to recommend feeding tube placement if aggressive intervention wanted however as appetite remains poor, po intake has been negligible on po diet, nutritional needs increased to acute illness, sepsis, wounds, dialysis. TPN has been discontinued due to fungemia; if GI tract functioning, TPN would no longer be appropriate anyway. Per MD order, will continue calorie count now that diet re-advanced and continue to assess 2) Medical Food Supplements: continue Mighty Shakes, Magic Cups on meal trays; Nepro between meals  NUTRITION DIAGNOSIS:   Inadequate oral intake related to inability to eat as evidenced by NPO status. Being addressed via calorie count, on diet, supplements  GOAL:   Patient will meet greater than or equal to 90% of their needs   MONITOR:    (Energy intake, Anthropometrics, Electrolyte/renal profile, Glucose profile, Digestive System)  ASSESSMENT:    Pt more alert today, able to participate with bedside swallowing eval, HD yesterday with plan for HD again tomorrow; noted documentation of pressure ulcers on 7/15, stage I/II   Diet Order:  DIET DYS 2 Room service appropriate?: Yes; Fluid consistency:: Thin   Energy Intake: per Belenda Cruise SLP, pt took a bite of pancakes, bite of peaches; drank 1/2 Mighty Shake, some juice. Per SLP, pt did not want anything else to eat, stated "no" to any other food items. Pt continues to drink liquids better than he eats solids but intake still remains minimal.   Skin:   (stage II on buttock, stage I on sacrum)  Last BM:   7/18  Meds: human albumin, D5-1/2 NS at 50 ml/hr, marinol, aspart,   Electrolyte and Renal Profile:  Recent Labs Lab 12/27/14 0255 12/28/14 0531  12/29/14 0430 12/30/14 0445  BUN 44* 75* 96* 52*  CREATININE 3.68* 5.33* 6.51* 4.49*  NA 138 137 139 139  K 4.4 4.0 3.9 3.6  MG 1.7 1.9 2.0  --   PHOS 1.9* 6.2* 7.6*  --    Glucose Profile:   Recent Labs  12/29/14 2335 12/30/14 0714 12/30/14 1052  GLUCAP 89 84 116*   Protein Profile:   Recent Labs Lab 12/28/14 0531  ALBUMIN 1.9*   Nutritional Anemia Profile:  CBC Latest Ref Rng 12/30/2014 12/29/2014 12/28/2014  WBC 3.8 - 10.6 K/uL 12.0(H) 14.1(H) 11.5(H)  Hemoglobin 13.0 - 18.0 g/dL 7.8(L) 7.0(L) 6.3(L)  Hematocrit 40.0 - 52.0 % 22.8(L) 21.4(L) 19.4(L)  Platelets 150 - 440 K/uL 69(L) 81(L) 106(L)     Height:   Ht Readings from Last 1 Encounters:  11/29/14 6' (1.829 m)    Weight:   Wt Readings from Last 1 Encounters:  12/30/14 146 lb 9.6 oz (66.497 kg)    Filed Weights   12/29/14 1245 12/29/14 1602 12/30/14 0602  Weight: 138 lb 14.2 oz (63 kg) 139 lb 6.4 oz (63.231 kg) 146 lb 9.6 oz (66.497 kg)    BMI:  Body mass index is 19.88 kg/(m^2).  Estimated Nutritional Needs:   Kcal:  9741-6384 kcals (BEE 1436, 1.2 AF, 1.2-1.4 IF) using current wt of 62.9 kg  Protein:  76-95 (1.2-1.5 g/kg)   Fluid:  1575-1890 mL (25-30 ml/kg)   EDUCATION NEEDS:   No education needs identified at this time  [place], RD, LDN [phone] Pager

## 2014-12-30 NOTE — Progress Notes (Signed)
Subjective:  Continues to more awake and alert than before. Oriented to self and place. UOP still in oliguric range.  Has temp dialysis catheter in right groin.   Objective:  Vital signs in last 24 hours:  Temp:  [98.3 F (36.8 C)-101.4 F (38.6 C)] 98.3 F (36.8 C) (07/18 0602) Pulse Rate:  [104-127] 104 (07/18 0602) Resp:  [16-34] 18 (07/18 0602) BP: (71-156)/(45-133) 88/56 mmHg (07/18 0602) SpO2:  [99 %-100 %] 99 % (07/18 0602) Weight:  [63 kg (138 lb 14.2 oz)-66.497 kg (146 lb 9.6 oz)] 66.497 kg (146 lb 9.6 oz) (07/18 0602)  Weight change: -0.821 kg (-1 lb 13 oz) Filed Weights   12/29/14 1245 12/29/14 1602 12/30/14 0602  Weight: 63 kg (138 lb 14.2 oz) 63.231 kg (139 lb 6.4 oz) 66.497 kg (146 lb 9.6 oz)    Intake/Output: I/O last 3 completed shifts: In: 350 [Blood:300; IV Piggyback:50] Out: 644 [Urine:500]   Intake/Output this shift:     Physical Exam: General: chronically ill appearing  Head: oral mucosa dry  Eyes: Anicteric  Neck: Supple, trachea midline  Lungs:  Clear to auscultation normal effort  Heart: Tachycardic, S1S2  Abdomen:  Soft, NTND, BS present  Extremities: Trace LE edema  Neurologic: Awake, alert, oriented to self/place, follows simple commands  Skin: No acute rashes  GU foley  Access: Right femoral dialysis catheter    Basic Metabolic Panel:  Recent Labs Lab 12/25/14 0455 12/26/14 0528 12/27/14 0255 12/28/14 0531 12/29/14 0430 12/30/14 0445  NA 137 143 138 137 139 139  K 3.8 4.0 4.4 4.0 3.9 3.6  CL 105 103 101 102 101 98*  CO2 27 31 26 24 22 28   GLUCOSE 142* 115* 195* 290* 214* 92  BUN 57* 30* 44* 75* 96* 52*  CREATININE 4.12* 2.50* 3.68* 5.33* 6.51* 4.49*  CALCIUM 8.3* 8.3* 8.6* 7.7* 7.8* 7.5*  MG 1.9 1.9 1.7 1.9 2.0  --   PHOS 5.0* 2.4* 1.9* 6.2* 7.6*  --     Liver Function Tests:  Recent Labs Lab 12/28/14 0531  ALBUMIN 1.9*   No results for input(s): LIPASE, AMYLASE in the last 168 hours. No results for input(s):  AMMONIA in the last 168 hours.  CBC:  Recent Labs Lab 12/25/14 0455 12/26/14 1630 12/27/14 0255 12/28/14 0531 12/29/14 0430 12/30/14 0445  WBC 11.5*  --  12.4* 11.5* 14.1* 12.0*  NEUTROABS  --   --  8.8*  --   --   --   HGB 6.8* 6.9* 7.1* 6.3* 7.0* 7.8*  HCT 20.3* 20.9* 22.0* 19.4* 21.4* 22.8*  MCV 88.5  --  88.1 87.8 87.5 84.4  PLT 167  --  158 106* 81* 69*    Cardiac Enzymes: No results for input(s): CKTOTAL, CKMB, CKMBINDEX, TROPONINI in the last 168 hours.  BNP: Invalid input(s): POCBNP  CBG:  Recent Labs Lab 12/29/14 1600 12/29/14 2038 12/29/14 2335 12/30/14 0714 12/30/14 1052  GLUCAP 73 77 89 35 116*    Microbiology: Results for orders placed or performed during the hospital encounter of 11/29/14  MRSA PCR Screening     Status: None   Collection Time: 11/29/14 12:13 PM  Result Value Ref Range Status   MRSA by PCR NEGATIVE NEGATIVE Final    Comment:        The GeneXpert MRSA Assay (FDA approved for NASAL specimens only), is one component of a comprehensive MRSA colonization surveillance program. It is not intended to diagnose MRSA infection nor to guide or monitor treatment  for MRSA infections.   Culture, blood (routine x 2)     Status: None   Collection Time: 11/29/14  1:19 PM  Result Value Ref Range Status   Specimen Description BLOOD  Final   Special Requests Immunocompromised  Final   Culture NO GROWTH 5 DAYS  Final   Report Status 12/04/2014 FINAL  Final  Urine culture     Status: None   Collection Time: 11/30/14 10:12 AM  Result Value Ref Range Status   Specimen Description URINE, CATHETERIZED  Final   Special Requests Immunocompromised  Final   Culture NO GROWTH 2 DAYS  Final   Report Status 12/02/2014 FINAL  Final  Culture, blood (routine x 2)     Status: None   Collection Time: 12/06/14  8:08 PM  Result Value Ref Range Status   Specimen Description BLOOD  Final   Special Requests NONE  Final   Culture  Setup Time   Final    GRAM  POSITIVE COCCI IN BOTH AEROBIC AND ANAEROBIC BOTTLES CRITICAL RESULT CALLED TO, READ BACK BY AND VERIFIED WITH: JENNIFER BEZARD AT 4193 12/08/14.PMH CONFIRMED BY [facility]    Culture   Final    STAPHYLOCOCCUS AURICULARIS IN BOTH AEROBIC AND ANAEROBIC BOTTLES    Report Status 12/11/2014 FINAL  Final   Organism ID, Bacteria STAPHYLOCOCCUS AURICULARIS  Final      Susceptibility   Staphylococcus auricularis - MIC*    CIPROFLOXACIN >=8 RESISTANT Resistant     ERYTHROMYCIN >=8 RESISTANT Resistant     GENTAMICIN <=0.5 SENSITIVE Sensitive     OXACILLIN >=4 RESISTANT Resistant     TETRACYCLINE <=1 SENSITIVE Sensitive     VANCOMYCIN 1 SENSITIVE Sensitive     CLINDAMYCIN <=0.25 SENSITIVE Sensitive     TRIMETH/SULFA Value in next row Sensitive      SENSITIVE<=20    LEVOFLOXACIN Value in next row Intermediate      INTERMEDIATE4    * STAPHYLOCOCCUS AURICULARIS  Culture, blood (routine x 2)     Status: None   Collection Time: 12/06/14  8:40 PM  Result Value Ref Range Status   Specimen Description BLOOD  Final   Special Requests NONE  Final   Culture NO GROWTH 5 DAYS  Final   Report Status 12/11/2014 FINAL  Final  Culture, blood (routine x 2)     Status: None   Collection Time: 12/08/14 11:40 AM  Result Value Ref Range Status   Specimen Description BLOOD  Final   Special Requests Normal  Final   Culture NO GROWTH 6 DAYS  Final   Report Status 12/14/2014 FINAL  Final  Culture, blood (routine x 2)     Status: None   Collection Time: 12/08/14 11:49 AM  Result Value Ref Range Status   Specimen Description BLOOD  Final   Special Requests Normal  Final   Culture NO GROWTH 6 DAYS  Final   Report Status 12/14/2014 FINAL  Final  C difficile quick scan w PCR reflex ([facility] only)     Status: None   Collection Time: 12/14/14 10:11 AM  Result Value Ref Range Status   C Diff antigen NEGATIVE  Final   C Diff toxin NEGATIVE  Final   C Diff interpretation Negative for C. difficile  Final  Culture, blood  (routine x 2)     Status: None (Preliminary result)   Collection Time: 12/27/14 11:28 AM  Result Value Ref Range Status   Specimen Description BLOOD  Final   Special Requests Immunocompromised  Final   Culture  Setup Time   Final    YEAST AEROBIC BOTTLE ONLY CRITICAL RESULT CALLED TO, READ BACK BY AND VERIFIED WITH: CATHY SUMMERLIN ON 12/29/14 AT 1000AM BY JEF    Culture   Final    YEAST AEROBIC BOTTLE ONLY IDENTIFICATION TO FOLLOW    Report Status PENDING  Incomplete  Culture, blood (routine x 2)     Status: None (Preliminary result)   Collection Time: 12/27/14 11:36 AM  Result Value Ref Range Status   Specimen Description BLOOD  Final   Special Requests Immunocompromised  Final   Culture  Setup Time   Final    YEAST AEROBIC BOTTLE ONLY CRITICAL RESULT CALLED TO, READ BACK BY AND VERIFIED WITH: LISA ROMERO AT 0111 ON 12/29/14 [facility] CONFIRMED BY [place]    Culture   Final    YEAST AEROBIC BOTTLE ONLY IDENTIFICATION TO FOLLOW    Report Status PENDING  Incomplete  Cath Tip Culture     Status: None   Collection Time: 12/27/14  4:17 PM  Result Value Ref Range Status   Specimen Description CATH TIP  Final   Special Requests NONE  Final   Culture NO GROWTH 2 DAYS  Final   Report Status 12/30/2014 FINAL  Final  Culture, blood (routine x 2)     Status: None (Preliminary result)   Collection Time: 12/29/14 12:28 PM  Result Value Ref Range Status   Specimen Description BLOOD RIGHT ARM  Final   Special Requests   Final    BOTTLES DRAWN AEROBIC AND ANAEROBIC  AER 4CC ANA 3CC   Culture NO GROWTH < 24 HOURS  Final   Report Status PENDING  Incomplete  Culture, blood (routine x 2)     Status: None (Preliminary result)   Collection Time: 12/29/14 12:28 PM  Result Value Ref Range Status   Specimen Description BLOOD RIGHT FATTY CASTS  Final   Special Requests BOTTLES DRAWN AEROBIC AND ANAEROBIC  5CC  Final   Culture NO GROWTH < 24 HOURS  Final   Report Status PENDING  Incomplete     Coagulation Studies: No results for input(s): LABPROT, INR in the last 72 hours.  Urinalysis: No results for input(s): COLORURINE, LABSPEC, PHURINE, GLUCOSEU, HGBUR, BILIRUBINUR, KETONESUR, PROTEINUR, UROBILINOGEN, NITRITE, LEUKOCYTESUR in the last 72 hours.  Invalid input(s): APPERANCEUR    Imaging: No results found.   Medications:   . amiodarone 30 mg/hr (12/29/14 2346)  . dextrose 5 % and 0.45% NaCl 50 mL/hr at 12/30/14 1120   . sodium chloride   Intravenous Once  . sodium chloride   Intravenous Once  . antiseptic oral rinse  7 mL Mouth Rinse BID  . carvedilol  20 mg Oral Daily  . dronabinol  2.5 mg Oral BID AC  . feeding supplement (NEPRO CARB STEADY)  237 mL Oral BID BM  . insulin aspart  0-9 Units Subcutaneous Q6H  . micafungin (MYCAMINE) IV  100 mg Intravenous Daily  . mometasone-formoterol  2 puff Inhalation BID  . pantoprazole (PROTONIX) IV  40 mg Intravenous Q12H  . piperacillin-tazobactam (ZOSYN)  IV  3.375 g Intravenous Q12H  . scopolamine  1 patch Transdermal Q72H  . ziprasidone  10 mg Intramuscular QHS   acetaminophen **OR** acetaminophen, albuterol, haloperidol lactate, heparin lock flush, lidocaine (PF), lidocaine-prilocaine, magnesium hydroxide, metoprolol, morphine injection, ondansetron (ZOFRAN) IV, promethazine, sodium chloride, sodium chloride, Vancomycin  Assessment/ Plan:  [name] is a 72 black male with progressive mantle cell lymphoma, RICE chemo  in 11/2014, hx left sided hydronephrosis and hydroureter. S/p ureteral stent placement   Hospital course complicated by sepsis with Klebsiella oxytoca, hypernatremia. Now with fungemia.   1.  Acute renal failure due to ATN: Pt has undergone multiple dialysis sessions.  Mental status does appear slightly improved after dialysis. Last dialysis 7/18.  - discussed with care management today, tolerated HD well yesterday, much more awake and alert as compared to before.  Will plan for HD again tomorrow.  -  will follow progress closely.  2.  Sepsis with fungemia. I41.9, B49. Status post bacteremiawith klebsiella oxytoca: completed two weeks for IV ceftazidime. Febrile again - has fungemia, on micafungin at the moment.    3.  Anemia of chronic kidney disease: hgb 7.8 today, up from 7.0 yesterday.    LOS: [place], Norbert Malkin 7/18/201611:56 AM

## 2014-12-30 NOTE — Progress Notes (Signed)
Physical Therapy Treatment Patient Details Name: Caleb Francis MRN: [id number] DOB: 05/13/1946 Today's Date: 12/30/2014    History of Present Illness Pt is a 69 y.o. 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  Temporary R dialysis catheter placed in R groin 12/17/14 (removed and replaced 7/17 d/t infection/fevers) and currently receiving dialysis.  Pt was in ICU but currently on floor.  Pt with recent admiission 11/08/14-11/19/14 for sepsis and AMS d/t UTI.  Pt went to [facility] prior to re-admission.     PT Comments    Pt demonstrating inconsistency with following commands for bed level ex's (appeared d/t fatigue/lethargy).  Unable to assess OOB mobility d/t R femoral temporary dialysis catheter (per policy).  Will continue to progress pt with bed ex's per pt tolerance.  Follow Up Recommendations  SNF     Equipment Recommendations  Other (comment) (TBD)    Recommendations for Other Services       Precautions / Restrictions Precautions Precautions: Fall Precaution Comments: R groin temporary dialysis catheter Restrictions Weight Bearing Restrictions: No    Mobility  Bed Mobility               General bed mobility comments: Deferred mobility per policy d/t R groin temporary dialysis catheter  Transfers                    Ambulation/Gait                 Stairs            Wheelchair Mobility    Modified Rankin (Stroke Patients Only)       Balance                                    Cognition Arousal/Alertness: Lethargic Behavior During Therapy: Flat affect Overall Cognitive Status: Impaired/Different from baseline Area of Impairment: Orientation Orientation Level: Disoriented to;Time                  Exercises   Performed semi-supine B LE therapeutic exercise x 10 reps:  Ankle pumps  (AROM B LE's); quad sets x3 second holds (AROM B LE's); SAQ's (AAROM L); heelslides (AAROM L), hip abd/adduction (AAROM L).  Most R LE ex's deferred d/t R temporary femoral dialysis catheter.  Pt required vc's and tactile cues for correct technique with exercises.     General Comments   Nursing cleared pt for participation in physical therapy.  Pt agreeable to PT session.      Pertinent Vitals/Pain Pain Assessment: No/denies pain  During session, pt's HR 100-101 bpm; pt's O2 >91%.    Home Living                      Prior Function            PT Goals (current goals can now be found in the care plan section) Acute Rehab PT Goals Patient Stated Goal: to get stronger PT Goal Formulation: With patient Time For Goal Achievement: 01/07/15 Potential to Achieve Goals: Fair Additional Goals Additional Goal #1: Assess OOB mobility and create functional goals at that time Progress towards PT goals: Not progressing toward goals - comment (pt with more difficulty following commands for LE ex's)    Frequency  Min 2X/week  PT Plan Current plan remains appropriate    Co-evaluation             End of Session   Activity Tolerance: Patient limited by fatigue;Patient limited by lethargy Patient left: in bed;with call bell/phone within reach;with bed alarm set     Time: 8412-8208 PT Time Calculation (min) (ACUTE ONLY): 8 min  Charges:  $Therapeutic Exercise: 8-22 mins                    G CodesLeitha Bleak 01/03/2015, 4:09 PM Leitha Bleak, [place]

## 2014-12-30 NOTE — Care Management Important Message (Signed)
Important Message  Patient Details  Name: Caleb Francis MRN: [id number] Date of Birth: 1946-04-10   Medicare Important Message Given:  Yes-second notification given    [name] 12/30/2014, 1:33 PM

## 2014-12-30 NOTE — Progress Notes (Signed)
[facility] at [facility]                                                                                                                                                                                            Patient Demographics   Caleb Francis, is a 69 y.o. male, DOB - 06/05/1946, QMV:[id number]  Admit date - 11/29/2014   Admitting Physician Aldean Jewett, MD  Outpatient Primary MD for the patient is No PCP Per Patient   Subjective:  patient is more alert and  feeling relatively better todayl. Had HD yesterday after new temp dialysis cath placement into rt femoral area. Drinks fluids but not eating much per RN Review of Systems:   Review of Systems  Constitutional: Negative for fever, chills and malaise/fatigue.  HENT: Negative for ear pain, hearing loss, nosebleeds and tinnitus.   Eyes: Negative for blurred vision, double vision, photophobia and pain.  Respiratory: Negative for cough, hemoptysis and shortness of breath.   Cardiovascular: Negative for chest pain, palpitations, orthopnea and leg swelling.  Gastrointestinal: Negative for heartburn, vomiting, abdominal pain and diarrhea.  Genitourinary: Negative for dysuria, urgency and flank pain.  Musculoskeletal: Negative for myalgias, back pain and neck pain.  Skin: Negative for itching and rash.  Neurological: Negative for dizziness, tremors, sensory change, focal weakness, seizures, weakness and headaches.  Endo/Heme/Allergies: Negative for environmental allergies. Does not bruise/bleed easily.  Psychiatric/Behavioral: Negative for memory loss. The patient is not nervous/anxious and does not have insomnia.      Vitals:   Filed Vitals:   12/30/14 1406 12/30/14 1741 12/30/14 1900 12/30/14 2007  BP: 74/46 74/47 73/43  73/43  Pulse: 100 100 102 97  Temp:  99.5 F (37.5 C) 98.8 F (37.1 C) 98.5 F (36.9 C)  TempSrc:  Oral Oral Oral  Resp:  20 20 20   Height:      Weight:      SpO2:   100%  100%    Wt Readings from Last 3 Encounters:  12/30/14 66.497 kg (146 lb 9.6 oz)  11/27/14 68.2 kg (150 lb 5.7 oz)  11/25/14 70.761 kg (156 lb)     Intake/Output Summary (Last 24 hours) at 12/30/14 2252 Last data filed at 12/30/14 2014  Gross per 24 hour  Intake 1000.4 ml  Output    200 ml  Net  800.4 ml    Physical Exam:   GENERAL: Not in acute distress more awake and alert today  PERRLA: Pupils equal reacting to light extraocular movements are intact. HEENT head atraumatic normocephalic. ENT unremarkable  NECK: Supple. There is no jugular venous distention. No  bruits, no lymphadenopathy, no thyromegaly.  HEART:  Tachycardic 2/6 systolic ejection murmur no rubs LUNGS: nml  breath sounds. ABDOMEN: active bowel sounds in all 4 quadrants without rebound or guarding  EXTREMITIES: No evidence of any cyanosis, clubbing,   Rt femoral temp dialysis catheter NEUROLOGIC: Awake and alert, oriented 3  Motor and sensory are grossly intact. Reflexes 2+ SKIN: Moist and warm with no rashes appreciated.  Psych:    no agitation reported overnight.  Radiology Reports 12/10/2014: CT abdomen: Large distended colonic ileus. No obstruction  12/12/2014: KUB   CBC  Recent Labs Lab 12/25/14 0455 12/26/14 1630 12/27/14 0255 12/28/14 0531 12/29/14 0430 12/30/14 0445  WBC 11.5*  --  12.4* 11.5* 14.1* 12.0*  HGB 6.8* 6.9* 7.1* 6.3* 7.0* 7.8*  HCT 20.3* 20.9* 22.0* 19.4* 21.4* 22.8*  PLT 167  --  158 106* 81* 69*  MCV 88.5  --  88.1 87.8 87.5 84.4  MCH 29.8  --  28.4 28.6 28.7 28.9  MCHC 33.7  --  32.2 32.6 32.8 34.3  RDW 18.8*  --  19.0* 18.9* 18.7* 18.5*  LYMPHSABS  --   --  1.3  --   --   --   MONOABS  --   --  2.1*  --   --   --   EOSABS  --   --  0.2  --   --   --   BASOSABS  --   --  0.1  --   --   --     Chemistries   Recent Labs Lab 12/25/14 0455 12/26/14 0528 12/27/14 0255 12/28/14 0531 12/29/14 0430 12/30/14 0445  NA 137 143 138 137 139 139  K 3.8 4.0 4.4 4.0  3.9 3.6  CL 105 103 101 102 101 98*  CO2 27 31 26 24 22 28   GLUCOSE 142* 115* 195* 290* 214* 92  BUN 57* 30* 44* 75* 96* 52*  CREATININE 4.12* 2.50* 3.68* 5.33* 6.51* 4.49*  CALCIUM 8.3* 8.3* 8.6* 7.7* 7.8* 7.5*  MG 1.9 1.9 1.7 1.9 2.0  --    ------------------------------------------------------------------------------------------------------------------- No results for input(s): TSH, T4TOTAL, T3FREE, THYROIDAB in the last 72 hours.  Invalid input(s): FREET3 ------------------------------------------------------------------------------------------------------------------  Coagulation profile  Recent Labs Lab 12/25/14 0455  INR 1.22     Assessment & Plan  This is a 69 year old male with mantle cell lymphoma undergoing chemotherapy who was admitted June 15 from oncology clinic with altered mental status and chest pain.  # Acute encephalopathy - Has delirium at this time secondary to fever and sepsis  Is better  #  recurrent  Sepsis: Now patient has fever and septic with fungemia- Continue to follow blood cultures showing yeast, urine cultures, chest x-ray.  Started pt on mycofungin day # 2,   D/ced vancomycin and Zosyn as pt has no aspiration per speech therapy Discontinued rt  groin temporary dialysis catheter. New left femoral temporary dialysis catheter just placed 7/17 . Permacath placement is  deferred  Discontinued TPN [facility] ID is recommending to d/c port F/u cultures    Vancomycin given until  12/21/2014 as recommended by ID and d/ced for  Klebsiella UTI and bacteremia and Staph bacteremia Patient's urine culture and blood cultures were positive for Klebsiella on admission. [facility]. Repeat blood cx on 12/06/2014 showed  STAPHYLOCOCCUS AURICULARIS .   # Acute renal failure due to ATN ; Follow-up with nephrology Requested site rotation for temporary dialysis catheter. Planning to do hemodialysis after temporary dialysis catheter placement today  #  Acute  encephalopathy - Has delirium which is better currently    # Mantle cell lymphoma: Patient is on salvage chemotherapy. Patient prognosis is extremely poor secondary to aneurysm or tumor community difficulties with previous chemotherapy. Cor status is DO NOT RESUSCITATE. I reviewed oncologist NOTES.  # Panctytopenia: Due to chemotherapy and Mantle cell lymphoma, patient is currently on Neupogen. Patient is s/p 2 units PRBC on 6/25 and 1 unit 12/14/2014 and has received PLT transfusion now stopped Patient needs premedication prior to any transfusions including Benadryl and Tylenol.   # Thrombocytopenia This is due to sepsis/lymphoma and chemo Improving,  # colonic Ileus with GI bleed: Patient is no longer neutropenic. Clinically improving.  discontinued NG tube and rectal tube 12/24/14    # Hypernatremia - resolved  # GI bleed: PPI. High risk for re bleeding due to thrombocytopenia.  # Acute blood loss anmeia over pancytopenia Due to GI losses and lymphoma. Monitor.  Status post PRBC transfusion   # Malnutrition with recurrent sepsis - poor PO intake ST is recommendiong PEG placement- d/w pt will think abt it Nutrition supplements, calorie counts Encourage po intake Will consider megace if no improvement   #Deconditioning PT consult is pending    Disposition- considering LTAC  Overall Prognosis is very poor   DNR DVT Prophylaxis   SCD's    Lab Results  Component Value Date   PLT 69* 12/30/2014     Poor prognosis  Time spent 35 minutes.   Nicholes Mango M.D on 12/30/2014 at 10:52 PM  Between 7am to 6pm - Pager - [phone]  After 6pm go to [email or web address] - password [email or web address] [facility]   Office  [phone]

## 2014-12-30 NOTE — Progress Notes (Signed)
Notified Dr. Margaretmary Eddy via telephone that bp is 74/46 and continues on amiodarone drip. Increase IV fluids to 60 cc/ hour per MD

## 2014-12-31 DIAGNOSIS — D696 Thrombocytopenia, unspecified: Secondary | ICD-10-CM

## 2014-12-31 DIAGNOSIS — D631 Anemia in chronic kidney disease: Secondary | ICD-10-CM

## 2014-12-31 LAB — BASIC METABOLIC PANEL
Anion gap: 16 — ABNORMAL HIGH (ref 5–15)
BUN: 66 mg/dL — ABNORMAL HIGH (ref 6–20)
CO2: 25 mmol/L (ref 22–32)
Calcium: 7.3 mg/dL — ABNORMAL LOW (ref 8.9–10.3)
Chloride: 96 mmol/L — ABNORMAL LOW (ref 101–111)
Creatinine, Ser: 5.98 mg/dL — ABNORMAL HIGH (ref 0.61–1.24)
GFR calc non Af Amer: 9 mL/min — ABNORMAL LOW (ref >60)
GFR, EST AFRICAN AMERICAN: 10 mL/min — AB (ref >60)
Glucose, Bld: 101 mg/dL — ABNORMAL HIGH (ref 65–99)
POTASSIUM: 3.1 mmol/L — AB (ref 3.5–5.1)
Sodium: 137 mmol/L (ref 135–145)

## 2014-12-31 LAB — GLUCOSE, CAPILLARY
GLUCOSE-CAPILLARY: 95 mg/dL (ref 65–99)
GLUCOSE-CAPILLARY: 79 mg/dL (ref 65–99)
Glucose-Capillary: 108 mg/dL — ABNORMAL HIGH (ref 65–99)
Glucose-Capillary: 117 mg/dL — ABNORMAL HIGH (ref 65–99)
Glucose-Capillary: 183 mg/dL — ABNORMAL HIGH (ref 65–99)
Glucose-Capillary: 93 mg/dL (ref 65–99)

## 2014-12-31 LAB — CBC
HEMATOCRIT: 21.8 % — AB (ref 40.0–52.0)
Hemoglobin: 7.3 g/dL — ABNORMAL LOW (ref 13.0–18.0)
MCH: 27.8 pg (ref 26.0–34.0)
MCHC: 33.3 g/dL (ref 32.0–36.0)
MCV: 83.4 fL (ref 80.0–100.0)
Platelets: 58 10*3/uL — ABNORMAL LOW (ref 150–440)
RBC: 2.62 MIL/uL — ABNORMAL LOW (ref 4.40–5.90)
RDW: 18.4 % — ABNORMAL HIGH (ref 11.5–14.5)
WBC: 11.1 10*3/uL — ABNORMAL HIGH (ref 3.8–10.6)

## 2014-12-31 LAB — VANCOMYCIN, TROUGH: Vancomycin Tr: 20 ug/mL (ref 10–20)

## 2014-12-31 MED ORDER — POTASSIUM CHLORIDE 20 MEQ PO PACK
40.0000 meq | PACK | Freq: Once | ORAL | Status: AC
Start: 2014-12-31 — End: 2014-12-31
  Administered 2014-12-31: 40 meq via ORAL
  Filled 2014-12-31: qty 2

## 2014-12-31 NOTE — Progress Notes (Signed)
INFECTIOUS DISEASE PROGRESS NOTE  ID: Caleb Francis is a 69 y.o. male with  Principal Problem:   Fungemia Active Problems:   Mantle cell lymphoma   Sepsis   Thrombocytopenia   Absolute anemia   Rheumatoid arthritis with rheumatoid factor   Altered mental status   Chest pain   Metabolic encephalopathy   Ileus   Fecal impaction   Protein-calorie malnutrition, severe   Ogilvie's syndrome   Pseudo-obstruction of colon   Pressure ulcer   Acute renal insufficiency  Subjective: E-visit  Abtx:  Anti-infectives    Start     Dose/Rate Route Frequency Ordered Stop   12/31/14 0000  vancomycin (VANCOCIN) IVPB 750 mg/150 ml premix  Status:  Discontinued     750 mg 150 mL/hr over 60 Minutes Intravenous Once per day on Mon Thu Sat 12/30/14 1126 12/30/14 1131   12/30/14 1200  vancomycin (VANCOCIN) IVPB 750 mg/150 ml premix  Status:  Discontinued     750 mg 150 mL/hr over 60 Minutes Intravenous Once per day on Mon Thu Sat 12/29/14 0903 12/30/14 1126   12/30/14 1129  vancomycin (VANCOCIN) IVPB 750 mg/150 ml premix  Status:  Discontinued    Comments:  Administer dose during last hour of dialysis as long as pre-dialysis random level is less then 20 mcg/mL   750 mg 150 mL/hr over 60 Minutes Intravenous Every Dialysis 12/30/14 1131 12/30/14 1527   12/29/14 1130  micafungin (MYCAMINE) 100 mg in sodium chloride 0.9 % 100 mL IVPB     100 mg 100 mL/hr over 1 Hours Intravenous Daily 12/29/14 1117     12/28/14 1200  vancomycin (VANCOCIN) IVPB 750 mg/150 ml premix  Status:  Discontinued     750 mg 150 mL/hr over 60 Minutes Intravenous Once per day on Tue Thu Sat 12/27/14 1210 12/29/14 0903   12/27/14 1800  fluconazole (DIFLUCAN) IVPB 200 mg  Status:  Discontinued     200 mg 100 mL/hr over 60 Minutes Intravenous Every 24 hours 12/27/14 1402 12/27/14 1404   12/27/14 1800  fluconazole (DIFLUCAN) IVPB 100 mg  Status:  Discontinued     100 mg 50 mL/hr over 60 Minutes Intravenous Every 24 hours  12/27/14 1404 12/29/14 1117   12/27/14 1400  piperacillin-tazobactam (ZOSYN) IVPB 3.375 g  Status:  Discontinued     3.375 g 12.5 mL/hr over 240 Minutes Intravenous 3 times per day 12/27/14 1049 12/27/14 1210   12/27/14 1300  vancomycin (VANCOCIN) 1,500 mg in sodium chloride 0.9 % 500 mL IVPB     1,500 mg 250 mL/hr over 120 Minutes Intravenous  Once 12/27/14 1210 12/27/14 1521   12/27/14 1230  piperacillin-tazobactam (ZOSYN) IVPB 3.375 g  Status:  Discontinued     3.375 g 12.5 mL/hr over 240 Minutes Intravenous Every 12 hours 12/27/14 1210 12/30/14 1527   12/27/14 1100  vancomycin (VANCOCIN) powder 1,000 mg  Status:  Discontinued     1,000 mg Other To Surgery 12/27/14 1049 12/27/14 1119   12/27/14 1100  vancomycin (VANCOCIN) IVPB 1000 mg/200 mL premix  Status:  Discontinued     1,000 mg 200 mL/hr over 60 Minutes Intravenous Every 24 hours 12/27/14 1049 12/27/14 1210   12/27/14 0000  ceFAZolin (ANCEF) IVPB 1 g/50 mL premix    Comments:  Send with pt to OR   1 g 100 mL/hr over 30 Minutes Intravenous On call 12/26/14 2208 12/28/14 0000   12/19/14 1800  vancomycin (VANCOCIN) IVPB 750 mg/150 ml premix  750 mg 150 mL/hr over 60 Minutes Intravenous  Once 12/19/14 1515 12/19/14 1940   12/15/14 0630  vancomycin (VANCOCIN) IVPB 750 mg/150 ml premix     750 mg 150 mL/hr over 60 Minutes Intravenous  Once 12/15/14 0616 12/15/14 0726   12/14/14 1300  vancomycin (VANCOCIN) IVPB 750 mg/150 ml premix  Status:  Discontinued     750 mg 150 mL/hr over 60 Minutes Intravenous Every 48 hours 12/13/14 1107 12/14/14 0604   12/14/14 0603  vancomycin (VANCOCIN) IVPB 750 mg/150 ml premix  Status:  Discontinued     750 mg 150 mL/hr over 60 Minutes Intravenous Daily PRN 12/14/14 0604 12/21/14 1110   12/09/14 0900  erythromycin 250 mg in sodium chloride 0.9 % 100 mL IVPB  Status:  Discontinued     250 mg 100 mL/hr over 60 Minutes Intravenous 3 times per day 12/09/14 0859 12/13/14 1054   12/08/14 1100   metroNIDAZOLE (FLAGYL) IVPB 500 mg  Status:  Discontinued     500 mg 100 mL/hr over 60 Minutes Intravenous Every 8 hours 12/08/14 0958 12/11/14 1048   12/08/14 0900  vancomycin (VANCOCIN) IVPB 750 mg/150 ml premix  Status:  Discontinued     750 mg 150 mL/hr over 60 Minutes Intravenous Every 24 hours 12/07/14 1102 12/12/14 0955   12/07/14 1030  cefTAZidime (FORTAZ) 2 g in dextrose 5 % 50 mL IVPB  Status:  Discontinued     2 g 100 mL/hr over 30 Minutes Intravenous Every 24 hours 12/07/14 1021 12/11/14 1048   12/07/14 0700  vancomycin (VANCOCIN) IVPB 1000 mg/200 mL premix  Status:  Discontinued     1,000 mg 200 mL/hr over 60 Minutes Intravenous Every 24 hours 12/06/14 2225 12/07/14 1102   12/06/14 2200  piperacillin-tazobactam (ZOSYN) IVPB 4.5 g  Status:  Discontinued     4.5 g 200 mL/hr over 30 Minutes Intravenous 3 times per day 12/06/14 1946 12/07/14 1011   12/06/14 2000  vancomycin (VANCOCIN) IVPB 1000 mg/200 mL premix     1,000 mg 200 mL/hr over 60 Minutes Intravenous STAT 12/06/14 1947 12/06/14 2113   12/06/14 2000  cefTRIAXone (ROCEPHIN) 1 g in dextrose 5 % 50 mL IVPB - Premix  Status:  Discontinued     1 g 100 mL/hr over 30 Minutes Intravenous Every 24 hours 12/06/14 1959 12/07/14 1011   12/06/14 1945  piperacillin-tazobactam (ZOSYN) IVPB 3.375 g  Status:  Discontinued     3.375 g 100 mL/hr over 30 Minutes Intravenous 4 times per day 12/06/14 1940 12/06/14 2007   12/06/14 1945  vancomycin (VANCOCIN) IVPB 1000 mg/200 mL premix  Status:  Discontinued     1,000 mg 200 mL/hr over 60 Minutes Intravenous Every 12 hours 12/06/14 1940 12/06/14 2008   12/01/14 0945  piperacillin-tazobactam (ZOSYN) IVPB 3.375 g  Status:  Discontinued     3.375 g 12.5 mL/hr over 240 Minutes Intravenous 3 times per day 12/01/14 0936 12/03/14 1337   11/30/14 1000  cefTRIAXone (ROCEPHIN) 2 g in dextrose 5 % 50 mL IVPB - Premix  Status:  Discontinued    Comments:  Entered per MD progress note   2 g 100 mL/hr  over 30 Minutes Intravenous Every 24 hours 11/29/14 2157 12/06/14 1940   11/29/14 1415  cefTRIAXone (ROCEPHIN) 1 g in dextrose 5 % 50 mL IVPB - Premix     1 g 100 mL/hr over 30 Minutes Intravenous  Once 11/29/14 1406 11/29/14 1452   11/29/14 1414  cefTRIAXone (ROCEPHIN) 20 MG/ML IVPB 50  mL    Comments:  MONAR, NELLIE: cabinet override      11/29/14 1414 11/29/14 1424      Medications:  Scheduled: . sodium chloride   Intravenous Once  . sodium chloride   Intravenous Once  . antiseptic oral rinse  7 mL Mouth Rinse BID  . carvedilol  20 mg Oral Daily  . dronabinol  2.5 mg Oral BID AC  . feeding supplement (ENSURE ENLIVE)  237 mL Oral BID BM  . feeding supplement (NEPRO CARB STEADY)  237 mL Oral BID BM  . insulin aspart  0-9 Units Subcutaneous Q6H  . micafungin (MYCAMINE) IV  100 mg Intravenous Daily  . mometasone-formoterol  2 puff Inhalation BID  . pantoprazole (PROTONIX) IV  40 mg Intravenous Q12H  . scopolamine  1 patch Transdermal Q72H  . ziprasidone  10 mg Intramuscular QHS    Objective: Vital signs in last 24 hours: Temp:  [97.6 F (36.4 C)-99.5 F (37.5 C)] 97.6 F (36.4 C) (07/19 1401) Pulse Rate:  [88-102] 96 (07/19 1401) Resp:  [14-23] 14 (07/19 1401) BP: (73-100)/(43-64) 82/50 mmHg (07/19 1401) SpO2:  [98 %-100 %] 98 % (07/19 1401) Weight:  [65.182 kg (143 lb 11.2 oz)-67.7 kg (149 lb 4 oz)] 67.7 kg (149 lb 4 oz) (07/19 1300)   e-visit  Lab Results  Recent Labs  12/30/14 0445 12/31/14 0455  WBC 12.0* 11.1*  HGB 7.8* 7.3*  HCT 22.8* 21.8*  NA 139 137  K 3.6 3.1*  CL 98* 96*  CO2 28 25  BUN 52* 66*  CREATININE 4.49* 5.98*   Liver Panel No results for input(s): PROT, ALBUMIN, AST, ALT, ALKPHOS, BILITOT, BILIDIR, IBILI in the last 72 hours. Sedimentation Rate No results for input(s): ESRSEDRATE in the last 72 hours. C-Reactive Protein No results for input(s): CRP in the last 72 hours.  Microbiology: Recent Results (from the past 240 hour(s))    Culture, blood (routine x 2)     Status: None (Preliminary result)   Collection Time: 12/27/14 11:28 AM  Result Value Ref Range Status   Specimen Description BLOOD  Final   Special Requests Immunocompromised  Final   Culture  Setup Time   Final    YEAST AEROBIC BOTTLE ONLY CRITICAL RESULT CALLED TO, READ BACK BY AND VERIFIED WITH: CATHY SUMMERLIN ON 12/29/14 AT 1000AM BY JEF    Culture CANDIDA PARAPSILOSIS AEROBIC BOTTLE ONLY   Final   Report Status PENDING  Incomplete  Culture, blood (routine x 2)     Status: None (Preliminary result)   Collection Time: 12/27/14 11:36 AM  Result Value Ref Range Status   Specimen Description BLOOD  Final   Special Requests Immunocompromised  Final   Culture  Setup Time   Final    YEAST AEROBIC BOTTLE ONLY CRITICAL RESULT CALLED TO, READ BACK BY AND VERIFIED WITH: LISA ROMERO AT 0111 ON 12/29/14 [facility] CONFIRMED BY [place]    Culture CANDIDA PARAPSILOSIS AEROBIC BOTTLE ONLY   Final   Report Status PENDING  Incomplete  Cath Tip Culture     Status: None   Collection Time: 12/27/14  4:17 PM  Result Value Ref Range Status   Specimen Description CATH TIP  Final   Special Requests NONE  Final   Culture NO GROWTH 2 DAYS  Final   Report Status 12/30/2014 FINAL  Final  Culture, blood (routine x 2)     Status: None (Preliminary result)   Collection Time: 12/29/14 12:28 PM  Result Value Ref Range  Status   Specimen Description BLOOD RIGHT ARM  Final   Special Requests   Final    BOTTLES DRAWN AEROBIC AND ANAEROBIC  AER 4CC ANA 3CC   Culture NO GROWTH 2 DAYS  Final   Report Status PENDING  Incomplete  Culture, blood (routine x 2)     Status: None (Preliminary result)   Collection Time: 12/29/14 12:28 PM  Result Value Ref Range Status   Specimen Description BLOOD RIGHT FATTY CASTS  Final   Special Requests BOTTLES DRAWN AEROBIC AND ANAEROBIC  5CC  Final   Culture NO GROWTH 2 DAYS  Final   Report Status PENDING  Incomplete    Studies/Results: No results  found.   Assessment/Plan: Fungemia- C parapsilosis Port Lymphoma Ureteral Stent ESRD R groin HD line placed 7-17 Total days of antibiotics: 3 micafungin  Await repeat BCx- so far no growth Will continue micafungin Pull port, my appreciation to surgery consult Not sure if he will clear HD line from fungemia, will watch.          Bobby Rumpf Infectious Diseases (pager) [phone] [email or web address] 12/31/2014, 4:13 PM  LOS: 32 days

## 2014-12-31 NOTE — Care Management (Signed)
Patient 's insurance has denied LTAC .

## 2014-12-31 NOTE — Progress Notes (Signed)
[place] at [facility]                                                                                                                                                                                            Patient Demographics   Park Beck, is a 69 y.o. male, DOB - Sep 10, 1945, ZOX:[id number]  Admit date - 11/29/2014   Admitting Physician Aldean Jewett, MD  Outpatient Primary MD for the patient is No PCP Per Patient   Subjective:  patient is  feeling better. Had HD today , new temp dialysis cath placement into rt femoral area. Drinks fluids but not eating much per RN, thinking about PEG placement Review of Systems:   Review of Systems  Constitutional: Negative for fever, chills and malaise/fatigue.  HENT: Negative for ear pain, hearing loss, nosebleeds and tinnitus.   Eyes: Negative for blurred vision, double vision, photophobia and pain.  Respiratory: Negative for cough, hemoptysis and shortness of breath.   Cardiovascular: Negative for chest pain, palpitations, orthopnea and leg swelling.  Gastrointestinal: Negative for heartburn, vomiting, abdominal pain and diarrhea.  Genitourinary: Negative for dysuria, urgency and flank pain.  Musculoskeletal: Negative for myalgias, back pain and neck pain.  Skin: Negative for itching and rash.  Neurological: Negative for dizziness, tremors, sensory change, focal weakness, seizures, weakness and headaches.  Endo/Heme/Allergies: Negative for environmental allergies. Does not bruise/bleed easily.  Psychiatric/Behavioral: Negative for memory loss. The patient is not nervous/anxious and does not have insomnia.      Vitals:   Filed Vitals:   12/31/14 1030 12/31/14 1100 12/31/14 1130 12/31/14 1200  BP: 88/62 92/59 93/64  100/60  Pulse: 89 88 88 89  Temp:      TempSrc:      Resp: 22 20 16 21   Height:      Weight:      SpO2: 100% 100% 100% 100%    Wt Readings from Last 3 Encounters:  12/31/14  67.4 kg (148 lb 9.4 oz)  11/27/14 68.2 kg (150 lb 5.7 oz)  11/25/14 70.761 kg (156 lb)     Intake/Output Summary (Last 24 hours) at 12/31/14 1313 Last data filed at 12/31/14 0602  Gross per 24 hour  Intake 1663.07 ml  Output    300 ml  Net 1363.07 ml    Physical Exam:   GENERAL: Not in acute distress more awake and alert today  PERRLA: Pupils equal reacting to light extraocular movements are intact. HEENT head atraumatic normocephalic. ENT unremarkable  NECK: Supple. There is no jugular venous distention. No bruits, no lymphadenopathy, no thyromegaly.  HEART:  Tachycardic 2/6 systolic  ejection murmur no rubs. Port site is intact on the anterior chest wall LUNGS: nml  breath sounds. ABDOMEN: active bowel sounds in all 4 quadrants without rebound or guarding  EXTREMITIES: No evidence of any cyanosis, clubbing,   Rt femoral temp dialysis catheter NEUROLOGIC: Awake and alert, oriented 3  Motor and sensory are grossly intact. Reflexes 2+ SKIN: Moist and warm with no rashes appreciated.  Psych:    no agitation reported overnight.  Radiology Reports 12/10/2014: CT abdomen: Large distended colonic ileus. No obstruction  12/12/2014: KUB   CBC  Recent Labs Lab 12/27/14 0255 12/28/14 0531 12/29/14 0430 12/30/14 0445 12/31/14 0455  WBC 12.4* 11.5* 14.1* 12.0* 11.1*  HGB 7.1* 6.3* 7.0* 7.8* 7.3*  HCT 22.0* 19.4* 21.4* 22.8* 21.8*  PLT 158 106* 81* 69* 58*  MCV 88.1 87.8 87.5 84.4 83.4  MCH 28.4 28.6 28.7 28.9 27.8  MCHC 32.2 32.6 32.8 34.3 33.3  RDW 19.0* 18.9* 18.7* 18.5* 18.4*  LYMPHSABS 1.3  --   --   --   --   MONOABS 2.1*  --   --   --   --   EOSABS 0.2  --   --   --   --   BASOSABS 0.1  --   --   --   --     Chemistries   Recent Labs Lab 12/25/14 0455 12/26/14 0528 12/27/14 0255 12/28/14 0531 12/29/14 0430 12/30/14 0445 12/31/14 0455  NA 137 143 138 137 139 139 137  K 3.8 4.0 4.4 4.0 3.9 3.6 3.1*  CL 105 103 101 102 101 98* 96*  CO2 27 31 26 24 22 28 25    GLUCOSE 142* 115* 195* 290* 214* 92 101*  BUN 57* 30* 44* 75* 96* 52* 66*  CREATININE 4.12* 2.50* 3.68* 5.33* 6.51* 4.49* 5.98*  CALCIUM 8.3* 8.3* 8.6* 7.7* 7.8* 7.5* 7.3*  MG 1.9 1.9 1.7 1.9 2.0  --   --    ------------------------------------------------------------------------------------------------------------------- No results for input(s): TSH, T4TOTAL, T3FREE, THYROIDAB in the last 72 hours.  Invalid input(s): FREET3 ------------------------------------------------------------------------------------------------------------------  Coagulation profile  Recent Labs Lab 12/25/14 0455  INR 1.22     Assessment & Plan  This is a 69 year old male with mantle cell lymphoma undergoing chemotherapy who was admitted June 15 from oncology clinic with altered mental status and chest pain.  # Acute encephalopathy - Has delirium at this time secondary to fever and sepsis  Is resolved #  recurrent  Sepsis: Now patient has fever and septic with fungemia- Continue to follow blood cultures showing yeast, urine cultures, chest x-ray.  Started pt on mycofungin day # 2,   D/ced vancomycin and Zosyn as pt has no aspiration per speech therapy Discontinued rt  groin temporary dialysis catheter. New left femoral temporary dialysis catheter just placed 7/17 . Permacath placement is  deferred  Discontinued TPN [facility] ID is recommending to d/c port, will discontinue Port-A-Cath F/u cultures    Vancomycin given until  12/21/2014 as recommended by ID and d/ced for  Klebsiella UTI and bacteremia and Staph bacteremia Patient's urine culture and blood cultures were positive for Klebsiella on admission. [place]. Repeat blood cx on 12/06/2014 showed  STAPHYLOCOCCUS AURICULARIS .   # Acute renal failure due to ATN ; Follow-up with nephrology Requested site rotation for temporary dialysis catheter. Planning to do hemodialysis after temporary dialysis catheter placement today  # Acute encephalopathy  - Has delirium which is better currently    # Mantle cell lymphoma: Patient is  on salvage chemotherapy. Patient prognosis is extremely poor secondary to aneurysm or tumor community difficulties with previous chemotherapy. Cor status is DO NOT RESUSCITATE. I reviewed oncologist NOTES.  # Panctytopenia: Due to chemotherapy and Mantle cell lymphoma, patient is currently on Neupogen. Patient is s/p 2 units PRBC on 6/25 and 1 unit 12/14/2014 and has received PLT transfusion now stopped Patient needs premedication prior to any transfusions including Benadryl and Tylenol.   # Thrombocytopenia This is due to sepsis/lymphoma and chemo Improving,  # colonic Ileus with GI bleed: Patient is no longer neutropenic. Clinically improving.  discontinued NG tube and rectal tube 12/24/14    # Hypernatremia - resolved  # GI bleed: PPI. High risk for re bleeding due to thrombocytopenia.  # Acute blood loss anmeia over pancytopenia Due to GI losses and lymphoma. Monitor.  Status post PRBC transfusion   # Malnutrition with recurrent sepsis - poor PO intake ST is recommendiong PEG placement- d/w, pt will think abt it, could not make any decision at this time regarding PEG placement. His daughter Baldo Ash , healthcare medical power of attorney is in [place] Nutrition supplements, calorie counts Encourage po intake Will consider megace if no improvement   #Deconditioning PT consult is pending    Disposition- considering LTAC  Overall Prognosis is very poor   DNR DVT Prophylaxis   SCD's    Lab Results  Component Value Date   PLT 58* 12/31/2014     Poor prognosis  Time spent 35 minutes.  Plan of care discussed in detail with the patient, RN and nephrology   Nicholes Mango M.D on 12/31/2014 at 1:13 PM  Between 7am to 6pm - Pager - [phone]  After 6pm go to [email or web address] - password [email or web address] [facility]   Office  [phone]

## 2014-12-31 NOTE — Progress Notes (Signed)
Subjective:  Seen during HD.  Toelrating well. No UF being performed.     Objective:  Vital signs in last 24 hours:  Temp:  [98.1 F (36.7 C)-99.5 F (37.5 C)] 98.4 F (36.9 C) (07/19 0930) Pulse Rate:  [89-105] 89 (07/19 1030) Resp:  [18-22] 22 (07/19 1030) BP: (73-91)/(43-62) 88/62 mmHg (07/19 1030) SpO2:  [98 %-100 %] 100 % (07/19 1030) Weight:  [65.182 kg (143 lb 11.2 oz)-67.4 kg (148 lb 9.4 oz)] 67.4 kg (148 lb 9.4 oz) (07/19 0930)  Weight change: 2.182 kg (4 lb 13 oz) Filed Weights   12/30/14 0602 12/31/14 0510 12/31/14 0930  Weight: 66.497 kg (146 lb 9.6 oz) 65.182 kg (143 lb 11.2 oz) 67.4 kg (148 lb 9.4 oz)    Intake/Output: I/O last 3 completed shifts: In: 2313.1 [I.V.:2163.1; IV Piggyback:150] Out: 300 [Urine:300]   Intake/Output this shift:     Physical Exam: General: chronically ill appearing  Head: oral mucosa dry  Eyes: Anicteric  Neck: Supple, trachea midline  Lungs:  Clear to auscultation normal effort  Heart: S1S2 RRR  Abdomen:  Soft, NTND, BS present  Extremities: Trace LE edema  Neurologic: Awake, alert, oriented to self/place, follows simple commands  Skin: No acute rashes  GU foley  Access: Right femoral dialysis catheter    Basic Metabolic Panel:  Recent Labs Lab 12/25/14 0455 12/26/14 0528 12/27/14 0255 12/28/14 0531 12/29/14 0430 12/30/14 0445 12/31/14 0455  NA 137 143 138 137 139 139 137  K 3.8 4.0 4.4 4.0 3.9 3.6 3.1*  CL 105 103 101 102 101 98* 96*  CO2 27 31 26 24 22 28 25   GLUCOSE 142* 115* 195* 290* 214* 92 101*  BUN 57* 30* 44* 75* 96* 52* 66*  CREATININE 4.12* 2.50* 3.68* 5.33* 6.51* 4.49* 5.98*  CALCIUM 8.3* 8.3* 8.6* 7.7* 7.8* 7.5* 7.3*  MG 1.9 1.9 1.7 1.9 2.0  --   --   PHOS 5.0* 2.4* 1.9* 6.2* 7.6*  --   --     Liver Function Tests:  Recent Labs Lab 12/28/14 0531  ALBUMIN 1.9*   No results for input(s): LIPASE, AMYLASE in the last 168 hours. No results for input(s): AMMONIA in the last 168  hours.  CBC:  Recent Labs Lab 12/27/14 0255 12/28/14 0531 12/29/14 0430 12/30/14 0445 12/31/14 0455  WBC 12.4* 11.5* 14.1* 12.0* 11.1*  NEUTROABS 8.8*  --   --   --   --   HGB 7.1* 6.3* 7.0* 7.8* 7.3*  HCT 22.0* 19.4* 21.4* 22.8* 21.8*  MCV 88.1 87.8 87.5 84.4 83.4  PLT 158 106* 81* 69* 58*    Cardiac Enzymes: No results for input(s): CKTOTAL, CKMB, CKMBINDEX, TROPONINI in the last 168 hours.  BNP: Invalid input(s): POCBNP  CBG:  Recent Labs Lab 12/30/14 2115 12/31/14 0003 12/31/14 0258 12/31/14 0601 12/31/14 0741  GLUCAP 123* 117* 108* 93 95    Microbiology: Results for orders placed or performed during the hospital encounter of 11/29/14  MRSA PCR Screening     Status: None   Collection Time: 11/29/14 12:13 PM  Result Value Ref Range Status   MRSA by PCR NEGATIVE NEGATIVE Final    Comment:        The GeneXpert MRSA Assay (FDA approved for NASAL specimens only), is one component of a comprehensive MRSA colonization surveillance program. It is not intended to diagnose MRSA infection nor to guide or monitor treatment for MRSA infections.   Culture, blood (routine x 2)  Status: None   Collection Time: 11/29/14  1:19 PM  Result Value Ref Range Status   Specimen Description BLOOD  Final   Special Requests Immunocompromised  Final   Culture NO GROWTH 5 DAYS  Final   Report Status 12/04/2014 FINAL  Final  Urine culture     Status: None   Collection Time: 11/30/14 10:12 AM  Result Value Ref Range Status   Specimen Description URINE, CATHETERIZED  Final   Special Requests Immunocompromised  Final   Culture NO GROWTH 2 DAYS  Final   Report Status 12/02/2014 FINAL  Final  Culture, blood (routine x 2)     Status: None   Collection Time: 12/06/14  8:08 PM  Result Value Ref Range Status   Specimen Description BLOOD  Final   Special Requests NONE  Final   Culture  Setup Time   Final    GRAM POSITIVE COCCI IN BOTH AEROBIC AND ANAEROBIC BOTTLES CRITICAL  RESULT CALLED TO, READ BACK BY AND VERIFIED WITH: JENNIFER BEZARD AT 3559 12/08/14.PMH CONFIRMED BY [facility]    Culture   Final    STAPHYLOCOCCUS AURICULARIS IN BOTH AEROBIC AND ANAEROBIC BOTTLES    Report Status 12/11/2014 FINAL  Final   Organism ID, Bacteria STAPHYLOCOCCUS AURICULARIS  Final      Susceptibility   Staphylococcus auricularis - MIC*    CIPROFLOXACIN >=8 RESISTANT Resistant     ERYTHROMYCIN >=8 RESISTANT Resistant     GENTAMICIN <=0.5 SENSITIVE Sensitive     OXACILLIN >=4 RESISTANT Resistant     TETRACYCLINE <=1 SENSITIVE Sensitive     VANCOMYCIN 1 SENSITIVE Sensitive     CLINDAMYCIN <=0.25 SENSITIVE Sensitive     TRIMETH/SULFA Value in next row Sensitive      SENSITIVE<=20    LEVOFLOXACIN Value in next row Intermediate      INTERMEDIATE4    * STAPHYLOCOCCUS AURICULARIS  Culture, blood (routine x 2)     Status: None   Collection Time: 12/06/14  8:40 PM  Result Value Ref Range Status   Specimen Description BLOOD  Final   Special Requests NONE  Final   Culture NO GROWTH 5 DAYS  Final   Report Status 12/11/2014 FINAL  Final  Culture, blood (routine x 2)     Status: None   Collection Time: 12/08/14 11:40 AM  Result Value Ref Range Status   Specimen Description BLOOD  Final   Special Requests Normal  Final   Culture NO GROWTH 6 DAYS  Final   Report Status 12/14/2014 FINAL  Final  Culture, blood (routine x 2)     Status: None   Collection Time: 12/08/14 11:49 AM  Result Value Ref Range Status   Specimen Description BLOOD  Final   Special Requests Normal  Final   Culture NO GROWTH 6 DAYS  Final   Report Status 12/14/2014 FINAL  Final  C difficile quick scan w PCR reflex ([facility] only)     Status: None   Collection Time: 12/14/14 10:11 AM  Result Value Ref Range Status   C Diff antigen NEGATIVE  Final   C Diff toxin NEGATIVE  Final   C Diff interpretation Negative for C. difficile  Final  Culture, blood (routine x 2)     Status: None (Preliminary result)   Collection  Time: 12/27/14 11:28 AM  Result Value Ref Range Status   Specimen Description BLOOD  Final   Special Requests Immunocompromised  Final   Culture  Setup Time   Final  YEAST AEROBIC BOTTLE ONLY CRITICAL RESULT CALLED TO, READ BACK BY AND VERIFIED WITH: CATHY SUMMERLIN ON 12/29/14 AT 1000AM BY JEF    Culture CANDIDA PARAPSILOSIS AEROBIC BOTTLE ONLY   Final   Report Status PENDING  Incomplete  Culture, blood (routine x 2)     Status: None (Preliminary result)   Collection Time: 12/27/14 11:36 AM  Result Value Ref Range Status   Specimen Description BLOOD  Final   Special Requests Immunocompromised  Final   Culture  Setup Time   Final    YEAST AEROBIC BOTTLE ONLY CRITICAL RESULT CALLED TO, READ BACK BY AND VERIFIED WITH: LISA ROMERO AT 0111 ON 12/29/14 [facility] CONFIRMED BY [place]    Culture CANDIDA PARAPSILOSIS AEROBIC BOTTLE ONLY   Final   Report Status PENDING  Incomplete  Cath Tip Culture     Status: None   Collection Time: 12/27/14  4:17 PM  Result Value Ref Range Status   Specimen Description CATH TIP  Final   Special Requests NONE  Final   Culture NO GROWTH 2 DAYS  Final   Report Status 12/30/2014 FINAL  Final  Culture, blood (routine x 2)     Status: None (Preliminary result)   Collection Time: 12/29/14 12:28 PM  Result Value Ref Range Status   Specimen Description BLOOD RIGHT ARM  Final   Special Requests   Final    BOTTLES DRAWN AEROBIC AND ANAEROBIC  AER 4CC ANA 3CC   Culture NO GROWTH 2 DAYS  Final   Report Status PENDING  Incomplete  Culture, blood (routine x 2)     Status: None (Preliminary result)   Collection Time: 12/29/14 12:28 PM  Result Value Ref Range Status   Specimen Description BLOOD RIGHT FATTY CASTS  Final   Special Requests BOTTLES DRAWN AEROBIC AND ANAEROBIC  5CC  Final   Culture NO GROWTH 2 DAYS  Final   Report Status PENDING  Incomplete    Coagulation Studies: No results for input(s): LABPROT, INR in the last 72 hours.  Urinalysis: No results  for input(s): COLORURINE, LABSPEC, PHURINE, GLUCOSEU, HGBUR, BILIRUBINUR, KETONESUR, PROTEINUR, UROBILINOGEN, NITRITE, LEUKOCYTESUR in the last 72 hours.  Invalid input(s): APPERANCEUR    Imaging: No results found.   Medications:   . amiodarone 30 mg/hr (12/31/14 0007)  . dextrose 5 % and 0.45% NaCl 60 mL/hr at 12/31/14 0602   . sodium chloride   Intravenous Once  . sodium chloride   Intravenous Once  . antiseptic oral rinse  7 mL Mouth Rinse BID  . carvedilol  20 mg Oral Daily  . dronabinol  2.5 mg Oral BID AC  . feeding supplement (ENSURE ENLIVE)  237 mL Oral BID BM  . feeding supplement (NEPRO CARB STEADY)  237 mL Oral BID BM  . insulin aspart  0-9 Units Subcutaneous Q6H  . micafungin (MYCAMINE) IV  100 mg Intravenous Daily  . mometasone-formoterol  2 puff Inhalation BID  . pantoprazole (PROTONIX) IV  40 mg Intravenous Q12H  . scopolamine  1 patch Transdermal Q72H  . ziprasidone  10 mg Intramuscular QHS   acetaminophen **OR** acetaminophen, albuterol, haloperidol lactate, lidocaine (PF), lidocaine-prilocaine, magnesium hydroxide, metoprolol, morphine injection, ondansetron (ZOFRAN) IV, promethazine, sodium chloride, sodium chloride  Assessment/ Plan:  Caleb Francis is a 89 black male with progressive mantle cell lymphoma, RICE chemo in 11/2014, hx left sided hydronephrosis and hydroureter. S/p ureteral stent placement   Hospital course complicated by sepsis with Klebsiella oxytoca, hypernatremia. Now with fungemia.   1.  Acute renal failure due to ATN: Pt has undergone multiple dialysis sessions.  Mental status does appear slightly improved after dialysis.  - Overall renal function was worse off of dialysis.  As before suspect that he may have progressed to ESRD.  Still appears quite frail.  Will continue HD on MWF schedule for now. Pt seen during HD today and seems to be tolerating well.  2.  Sepsis with fungemia. I41.9, B49. Status post bacteremia with klebsiella oxytoca:  completed two weeks for IV ceftazidime. Febrile again - Porta cath to be removed, dialysis catheter was recently replaced.  Pt on micafungin at present.   3.  Anemia of chronic kidney disease/mantle cell lymphoma:  Prior notes from oncology reviewed, will discuss use of epogen with Dr. Danella Deis.  LOS: [place], Calley Drenning 7/19/201610:43 AM

## 2014-12-31 NOTE — Progress Notes (Signed)
Brief Nutrition Note:   Calorie Count in progress. No documented intake from yesterday in calorie count folder, did note bites documented in I/O flowsheet from lunch and dinner last night with sips of supplement between meals. Brandi RN is pt's nurse today and also cared for patient yesterday. Per Velna Hatchet RN, pt took very little yesterday, RN to document po intake from yesterday and place in calorie count folder. Pt leaving for dialysis on visit today, meal tray in patient room, but meal untouched; Nepro opened and looked like pt maybe drank 20-25%. MightyShake untouched. Pt definitely needs feeding assistance and encouragement at meal times to promote po intake. Continue all supplements.  Discussed this as well as importance of documentation of po intake for calorie count with Brandi RN as well as Karn Pickler CNA; plan to call for lunch tray once pt gets back from dialysis, plan to encourage supplements.   If po intake continues as bites/sips at all meals, pt would definitely benefit from feeding tube pacement  HIGH Care Level  [facility] MS, RD, LDN [phone] Pager

## 2014-12-31 NOTE — Progress Notes (Signed)
[facility] [address] Hematology/Oncology Progress Note  Date of admission: 11/29/2014  Hospital day:  12/31/2014  Chief Complaint: Caleb Francis is a 69 y.o. 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  Subjective:  Sitting up in bed watching TV.  Interactive.  Denies pain.    Social History: The patient is accompanied by his son today.  Allergies: No Known Allergies  Scheduled Medications: . sodium chloride   Intravenous Once  . sodium chloride   Intravenous Once  . antiseptic oral rinse  7 mL Mouth Rinse BID  . carvedilol  20 mg Oral Daily  . dronabinol  2.5 mg Oral BID AC  . feeding supplement (ENSURE ENLIVE)  237 mL Oral BID BM  . feeding supplement (NEPRO CARB STEADY)  237 mL Oral BID BM  . insulin aspart  0-9 Units Subcutaneous Q6H  . micafungin (MYCAMINE) IV  100 mg Intravenous Daily  . mometasone-formoterol  2 puff Inhalation BID  . pantoprazole (PROTONIX) IV  40 mg Intravenous Q12H  . scopolamine  1 patch Transdermal Q72H  . ziprasidone  10 mg Intramuscular QHS   Review of Systems: GENERAL:  Feels "ok".  No fevers, sweats or weight loss.  Denies pain. PERFORMANCE STATUS (ECOG):  4 HEENT:  No visual changes, runny nose, sore throat, mouth sores or tenderness. Lungs: No shortness of breath or cough.  No hemoptysis. Cardiac:  No chest pain, palpitations, orthopnea, or PND. GI:  Appetite poor.  No nausea, vomiting, diarrhea, constipation, melena or hematochezia.  Denies abdominal pain. GU:  Denies any symptoms. On chronic dialysis. Musculoskeletal:  No back or joint pain.  Extremities:  No pain or swelling. Skin:  Denies any rashes or skin changes. Neuro:  General weakness.  No headache, focal weakness or numbness. Pain:  No focal pain. Review of systems:  All other systems reviewed and found to be negative.  Physical Exam: Blood pressure 90/55, pulse  100, temperature 98.8 F (37.1 C), temperature source Oral, resp. rate 18, height 6' (1.829 m), weight 149 lb 4.8 oz (67.722 kg), SpO2 100 %.  GENERAL: Chronically ill appearing gentleman lying in bed on the telemetry unit in no acute distress. MENTAL STATUS:Alert and oriented to person, place and time. HEAD: Thin short graying hair. Normocephalic, atraumatic, face symmetric, no Cushingoid features. EYES: Brown eyes. Pupils equal round and reactive to light and accomodation. No conjunctivitis or scleral icterus. ENT: Oropharynx clear. Poor dentition.  RESPIRATORY: Decreased breath sounds at the bases.  No wheezes. CARDIOVASCULAR: Regular rate and rhythm without murmur, rub or gallop. ABDOMEN: Soft, minimal bowel sounds, and no hepatosplenomegaly. No tenderness. No guarding or rebound tenderness.  No masses. LYMPH NODES:  Left axillary node 2-2.5 cm. SKIN: No rashes, ulcers or lesions. EXTREMITIES: No edema, no skin discoloration or tenderness. No palpable cords. NEUROLOGICAL:  Appropriate.  Results for orders placed or performed during the hospital encounter of 11/29/14 (from the past 48 hour(s))  Basic metabolic panel     Status: Abnormal   Collection Time: 12/30/14  4:45 AM  Result Value Ref Range   Sodium 139 135 - 145 mmol/L   Potassium 3.6 3.5 - 5.1 mmol/L   Chloride 98 (L) 101 - 111 mmol/L   CO2 28 22 - 32 mmol/L   Glucose, Bld 92 65 - 99 mg/dL   BUN 52 (H) 6 - 20 mg/dL   Creatinine, Ser 4.49 (H) 0.61 -  1.24 mg/dL   Calcium 7.5 (L) 8.9 - 10.3 mg/dL   GFR calc non Af Amer 12 (L) >60 mL/min   GFR calc Af Amer 14 (L) >60 mL/min    Comment: (NOTE) The eGFR has been calculated using the CKD EPI equation. This calculation has not been validated in all clinical situations. eGFR's persistently <60 mL/min signify possible Chronic Kidney Disease.    Anion gap 13 5 - 15  CBC     Status: Abnormal   Collection Time: 12/30/14  4:45 AM  Result Value Ref Range   WBC 12.0  (H) 3.8 - 10.6 K/uL   RBC 2.70 (L) 4.40 - 5.90 MIL/uL   Hemoglobin 7.8 (L) 13.0 - 18.0 g/dL   HCT 22.8 (L) 40.0 - 52.0 %   MCV 84.4 80.0 - 100.0 fL   MCH 28.9 26.0 - 34.0 pg   MCHC 34.3 32.0 - 36.0 g/dL   RDW 18.5 (H) 11.5 - 14.5 %   Platelets 69 (L) 150 - 440 K/uL  Glucose, capillary     Status: None   Collection Time: 12/30/14  7:14 AM  Result Value Ref Range   Glucose-Capillary 84 65 - 99 mg/dL   Comment 1 Notify RN   Glucose, capillary     Status: Abnormal   Collection Time: 12/30/14 10:52 AM  Result Value Ref Range   Glucose-Capillary 116 (H) 65 - 99 mg/dL   Comment 1 Notify RN   Glucose, capillary     Status: None   Collection Time: 12/30/14  4:42 PM  Result Value Ref Range   Glucose-Capillary 95 65 - 99 mg/dL   Comment 1 Notify RN   Glucose, capillary     Status: Abnormal   Collection Time: 12/30/14  8:13 PM  Result Value Ref Range   Glucose-Capillary 108 (H) 65 - 99 mg/dL  Glucose, capillary     Status: Abnormal   Collection Time: 12/30/14  9:15 PM  Result Value Ref Range   Glucose-Capillary 123 (H) 65 - 99 mg/dL  Glucose, capillary     Status: Abnormal   Collection Time: 12/31/14 12:03 AM  Result Value Ref Range   Glucose-Capillary 117 (H) 65 - 99 mg/dL  Glucose, capillary     Status: Abnormal   Collection Time: 12/31/14  2:58 AM  Result Value Ref Range   Glucose-Capillary 108 (H) 65 - 99 mg/dL  CBC     Status: Abnormal   Collection Time: 12/31/14  4:55 AM  Result Value Ref Range   WBC 11.1 (H) 3.8 - 10.6 K/uL   RBC 2.62 (L) 4.40 - 5.90 MIL/uL   Hemoglobin 7.3 (L) 13.0 - 18.0 g/dL   HCT 21.8 (L) 40.0 - 52.0 %   MCV 83.4 80.0 - 100.0 fL   MCH 27.8 26.0 - 34.0 pg   MCHC 33.3 32.0 - 36.0 g/dL   RDW 18.4 (H) 11.5 - 14.5 %   Platelets 58 (L) 150 - 440 K/uL  Basic metabolic panel     Status: Abnormal   Collection Time: 12/31/14  4:55 AM  Result Value Ref Range   Sodium 137 135 - 145 mmol/L   Potassium 3.1 (L) 3.5 - 5.1 mmol/L   Chloride 96 (L) 101 - 111  mmol/L   CO2 25 22 - 32 mmol/L   Glucose, Bld 101 (H) 65 - 99 mg/dL   BUN 66 (H) 6 - 20 mg/dL   Creatinine, Ser 5.98 (H) 0.61 - 1.24 mg/dL   Calcium 7.3 (L)  8.9 - 10.3 mg/dL   GFR calc non Af Amer 9 (L) >60 mL/min   GFR calc Af Amer 10 (L) >60 mL/min    Comment: (NOTE) The eGFR has been calculated using the CKD EPI equation. This calculation has not been validated in all clinical situations. eGFR's persistently <60 mL/min signify possible Chronic Kidney Disease.    Anion gap 16 (H) 5 - 15  Glucose, capillary     Status: None   Collection Time: 12/31/14  6:01 AM  Result Value Ref Range   Glucose-Capillary 93 65 - 99 mg/dL  Glucose, capillary     Status: None   Collection Time: 12/31/14  7:41 AM  Result Value Ref Range   Glucose-Capillary 95 65 - 99 mg/dL   Comment 1 Notify RN   Vancomycin, trough     Status: None   Collection Time: 12/31/14 10:14 AM  Result Value Ref Range   Vancomycin Tr 20 10 - 20 ug/mL  Glucose, capillary     Status: None   Collection Time: 12/31/14  2:01 PM  Result Value Ref Range   Glucose-Capillary 79 65 - 99 mg/dL   Comment 1 Notify RN    Comment 2 Document in Chart   Glucose, capillary     Status: Abnormal   Collection Time: 12/31/14  8:06 PM  Result Value Ref Range   Glucose-Capillary 183 (H) 65 - 99 mg/dL   No results found.  Assessment:  Caleb Francis is a 69 y.o. 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 He was admitted with Klebsiella sepsis due to pyelonephritis and associated altered mental status. He subsequently developed renal insufficiency with assciated hypernatremia. He is out of the ICU and on the telemetry unit.  He has a history of ICU psychosis and altered mental status secondary to hypernatremia.  He has progressive renal failure.  Dialysis was started on 12/18/2014.  He had a platelet transfusion reaction on 12/06/2014. Blood cultures from 12/06/2014 grew staph auricularis  (coag negative staph) . He had an upper GI bleed on 12/07/2014. He last received PRBCs on 12/18/2014. Platelet count is adequate.  WBC and ANC are normal off GCSF.  He had an ileus.  A G tube and rectal tube were placement and have now been removed.  He has been evaluated by speech therapy to allow him to eat.  A permanent dialysis catheter is on hold secondary to fungemia.  His port will be pulled.  Plan: 1. Hematology/Oncology: Day 63 s/p chemotherapy. WBC is normal.  GCSF discontinued 12/12/2014.  Platelet count low secondary to poor marrow reserve and current infection (consumption).  Received PRBCs on 12/18/2014.  Will type and screen him for possible transfusion with dialysis.  Discussed ok to give erythropoietin with nephrology.  Premed all blood products with Tylenol and Benadryl. 2. Infectious disease: Klebsiella oxytoca bacteremia treated.  Cetazidime completed.  Vancomycin completed for coag negative staph.  No evidence of endocarditis by echo. Blood culture from 12/27/2014 revealed candida parapsilosis.  Day #3 micafungin.  Port to be removed. 3. Nephrology: Hypernatremia resolved.  Chronic renal insufficiency on dialysis. Postponing permanent dialysis catheter until fungemia resolved.  4. Neurology:  Back to baseline mentation.  5. Gastroenterology: UGI bleed resolved. Ileus s/p rectal tube, now discontinued.  TPN started 12/12/2014.  Poor appetite on Marinol. 6. Disposition: Prognosis extremely poor.  Hospital day 32.  He has not been out of bed (debiliated).  Concern about patient's ability to proceed to  transplant in future .  Short term plan, when he is able, is to go to a skilled nursing facility for rehabilitation.  I remain doubtful that he will be a suitable transplant candidate.  DNR code status.   Lequita Asal, MD

## 2014-12-31 NOTE — Plan of Care (Signed)
Problem: Phase III Progression Outcomes Goal: Other Phase III Outcomes/Goals Outcome: Not Progressing Pt is alert and oriented x 3, denies pain, on bed rest, hd performed without fluid removal, bp remains hypotensive, on amiodarone drip, at dialysis during time of breakfast, only small bites of lunch but drank supplement drink, receiving IV fluids and IV antibiotics, port deaccessed per MD order, afebrile throughout shift, wbc improving, uneventful shift. Small bm throughout shift.

## 2014-12-31 NOTE — Progress Notes (Signed)
HD tx start 

## 2015-01-01 LAB — CBC WITH DIFFERENTIAL/PLATELET
Band Neutrophils: 2 % (ref 0–10)
Basophils Absolute: 0 10*3/uL (ref 0.0–0.1)
Basophils Relative: 0 % (ref 0–1)
Blasts: 0 %
Eosinophils Absolute: 0.7 10*3/uL (ref 0.0–0.7)
Eosinophils Relative: 6 % — ABNORMAL HIGH (ref 0–5)
HCT: 21.6 % — ABNORMAL LOW (ref 40.0–52.0)
Hemoglobin: 7.3 g/dL — ABNORMAL LOW (ref 13.0–18.0)
Lymphocytes Relative: 13 % (ref 12–46)
Lymphs Abs: 1.4 10*3/uL (ref 0.7–4.0)
MCH: 28.3 pg (ref 26.0–34.0)
MCHC: 33.7 g/dL (ref 32.0–36.0)
MCV: 84.1 fL (ref 80.0–100.0)
Metamyelocytes Relative: 0 %
Monocytes Absolute: 1.1 10*3/uL — ABNORMAL HIGH (ref 0.1–1.0)
Monocytes Relative: 10 % (ref 3–12)
Myelocytes: 0 %
Neutro Abs: 7.9 10*3/uL — ABNORMAL HIGH (ref 1.7–7.7)
Neutrophils Relative %: 69 % (ref 43–77)
Other: 0 %
Platelets: 56 10*3/uL — ABNORMAL LOW (ref 150–440)
Promyelocytes Absolute: 0 %
RBC: 2.56 MIL/uL — ABNORMAL LOW (ref 4.40–5.90)
RDW: 18 % — ABNORMAL HIGH (ref 11.5–14.5)
WBC: 11.1 10*3/uL — ABNORMAL HIGH (ref 3.8–10.6)
nRBC: 0 /100 WBC

## 2015-01-01 LAB — TYPE AND SCREEN
ABO/RH(D): O POS
Antibody Screen: NEGATIVE

## 2015-01-01 LAB — BASIC METABOLIC PANEL
Anion gap: 13 (ref 5–15)
BUN: 38 mg/dL — ABNORMAL HIGH (ref 6–20)
CALCIUM: 7.4 mg/dL — AB (ref 8.9–10.3)
CHLORIDE: 100 mmol/L — AB (ref 101–111)
CO2: 26 mmol/L (ref 22–32)
CREATININE: 4.31 mg/dL — AB (ref 0.61–1.24)
GFR, EST AFRICAN AMERICAN: 15 mL/min — AB (ref >60)
GFR, EST NON AFRICAN AMERICAN: 13 mL/min — AB (ref >60)
GLUCOSE: 112 mg/dL — AB (ref 65–99)
Potassium: 3 mmol/L — ABNORMAL LOW (ref 3.5–5.1)
Sodium: 139 mmol/L (ref 135–145)

## 2015-01-01 LAB — GLUCOSE, CAPILLARY
GLUCOSE-CAPILLARY: 115 mg/dL — AB (ref 65–99)
GLUCOSE-CAPILLARY: 146 mg/dL — AB (ref 65–99)
GLUCOSE-CAPILLARY: 168 mg/dL — AB (ref 65–99)
GLUCOSE-CAPILLARY: 149 mg/dL — AB (ref 65–99)
GLUCOSE-CAPILLARY: 200 mg/dL — AB (ref 65–99)
Glucose-Capillary: 121 mg/dL — ABNORMAL HIGH (ref 65–99)

## 2015-01-01 MED ORDER — EPOETIN ALFA 20000 UNIT/ML IJ SOLN
14000.0000 [IU] | INTRAMUSCULAR | Status: DC
  Administered 2015-01-04: 14000 [IU] via SUBCUTANEOUS
  Filled 2015-01-01 (×6): qty 1

## 2015-01-01 MED ORDER — POTASSIUM CHLORIDE 10 MEQ/100ML IV SOLN
10.0000 meq | INTRAVENOUS | Status: AC
  Administered 2015-01-01 (×4): 10 meq via INTRAVENOUS
  Filled 2015-01-01 (×4): qty 100

## 2015-01-01 NOTE — Progress Notes (Signed)
[facility] NAME: Caleb Francis    MR#:  [id number]  DATE OF BIRTH:  18-Jul-1945  SUBJECTIVE:  CHIEF COMPLAINT:   Chief Complaint  Patient presents with  . Altered Mental Status   No acute complaints today. Denies pain shortness of breath. Has just had a large watery bowel movements.  REVIEW OF SYSTEMS:   Review of Systems  Constitutional: Negative for fever.  Respiratory: Negative for shortness of breath.   Cardiovascular: Negative for chest pain and palpitations.  Gastrointestinal: Positive for diarrhea. Negative for nausea, vomiting and abdominal pain.  Genitourinary: Negative for dysuria.    DRUG ALLERGIES:  No Known Allergies  VITALS:  Blood pressure 93/49, pulse 100, temperature 98.8 F (37.1 C), temperature source Oral, resp. rate 20, height 6' (1.829 m), weight 67.268 kg (148 lb 4.8 oz), SpO2 100 %.  PHYSICAL EXAMINATION:  GENERAL:  68 y.o.-year-old patient lying in the bed with no acute distress.  EYES: Pupils equal, round, reactive to light and accommodation. No scleral icterus. Extraocular muscles intact.  HEENT: Head atraumatic, normocephalic. Oropharynx and nasopharynx clear.  NECK:  Supple, no jugular venous distention. No thyroid enlargement, no tenderness.  LUNGS: Normal breath sounds bilaterally, no wheezing, rales,rhonchi or crepitation. No use of accessory muscles of respiration. Good air movement CARDIOVASCULAR: S1, S2 normal. 2/6 systolic ejection murmur, no rubs, or gallops.  ABDOMEN: Soft, nontender, nondistended. Bowel sounds present. No organomegaly or mass.  EXTREMITIES: No pedal edema, cyanosis, or clubbing.  NEUROLOGIC: Cranial nerves II through XII are intact. Muscle strength 5/5 in all extremities. Sensation intact. Gait not checked.  PSYCHIATRIC: The patient is alert and calm  SKIN: No obvious rash, lesion, or ulcer.   There is a right femoral temp dialysis catheter in  place  There continues to be a port in the right chest wall   LABORATORY PANEL:   CBC  Recent Labs Lab 01/01/15 0453  WBC 11.1*  HGB 7.3*  HCT 21.6*  PLT 56*   ------------------------------------------------------------------------------------------------------------------  Chemistries   Recent Labs Lab 12/29/14 0430  01/01/15 0453  NA 139  < > 139  K 3.9  < > 3.0*  CL 101  < > 100*  CO2 22  < > 26  GLUCOSE 214*  < > 112*  BUN 96*  < > 38*  CREATININE 6.51*  < > 4.31*  CALCIUM 7.8*  < > 7.4*  MG 2.0  --   --   < > = values in this interval not displayed. ------------------------------------------------------------------------------------------------------------------  Cardiac Enzymes No results for input(s): TROPONINI in the last 168 hours. ------------------------------------------------------------------------------------------------------------------  RADIOLOGY:  No results found.  EKG:   Orders placed or performed during the hospital encounter of 11/29/14  . ED EKG  . ED EKG    ASSESSMENT AND PLAN:   #1 sepsis: - Fungemia with Candida parapsilosis blood cultures 7/15. Appreciate infectious disease consultation. Continue micafungin. Consult to vascular surgery placed to remove Port-A-Cath. Repeat cultures 7/17 so far negative - Bacteremia with Klebsiella oxytoca status post 2 weeks of IV ceftaz  - Bacteremia with coag negative Staphylococcus, treated with vancomycin, echo negative - Hemodialysis catheter placed on the 17th, urethral catheter placed 26 days ago  #2 acute renal failure due to ATN - Appreciate nephrology following, receiving hemodialysis  #3 Mantle cell lymphoma - Appreciate oncology following - Currently on Neupogen, status post 2 units packed red blood cells on 6/25 and 1 unit on 7/2 - Will need  premedication prior to any further transfusions including Benadryl and Tylenol - Subsequent pancytopenia  #4 chronic ileus with GI  bleeding: Clinically improving. NG tube and rectal tube discontinued 7/12 - Continue PPI, high risk for further bleeding due to thrombocytopenia  #5 deconditioning: Follow physical therapy recommendations, considering LTAC    CODE STATUS: DO NOT RESUSCITATE  TOTAL TIME TAKING CARE OF THIS PATIENT: 41 minutes.  Greater than 50% of time spent in care coordination and counseling.All the records are reviewed and case discussed with Care Management/Social Worker. Management plans discussed with the patient, family and they are in agreement.  POSSIBLE D/C IN 3-4 DAYS, DEPENDING ON CLINICAL CONDITION.   Myrtis Ser M.D on 01/01/2015 at 1:30 PM  Between 7am to 6pm - Pager - [phone]  After 6pm go to [email or web address] - password [email or web address] [facility]  [facility]  Office  [phone]  CC: Primary care physician; No PCP Per Patient

## 2015-01-01 NOTE — Progress Notes (Signed)
INFECTIOUS DISEASE PROGRESS NOTE  ID: Caleb Francis is a 69 y.o. male with  Principal Problem:   Fungemia Active Problems:   Mantle cell lymphoma   Sepsis   Thrombocytopenia   Absolute anemia   Rheumatoid arthritis with rheumatoid factor   Altered mental status   Chest pain   Metabolic encephalopathy   Ileus   Fecal impaction   Protein-calorie malnutrition, severe   Ogilvie's syndrome   Pseudo-obstruction of colon   Pressure ulcer   Acute renal insufficiency  Subjective: evisit  Abtx:  Anti-infectives    Start     Dose/Rate Route Frequency Ordered Stop   12/31/14 0000  vancomycin (VANCOCIN) IVPB 750 mg/150 ml premix  Status:  Discontinued     750 mg 150 mL/hr over 60 Minutes Intravenous Once per day on Mon Thu Sat 12/30/14 1126 12/30/14 1131   12/30/14 1200  vancomycin (VANCOCIN) IVPB 750 mg/150 ml premix  Status:  Discontinued     750 mg 150 mL/hr over 60 Minutes Intravenous Once per day on Mon Thu Sat 12/29/14 0903 12/30/14 1126   12/30/14 1129  vancomycin (VANCOCIN) IVPB 750 mg/150 ml premix  Status:  Discontinued    Comments:  Administer dose during last hour of dialysis as long as pre-dialysis random level is less then 20 mcg/mL   750 mg 150 mL/hr over 60 Minutes Intravenous Every Dialysis 12/30/14 1131 12/30/14 1527   12/29/14 1130  micafungin (MYCAMINE) 100 mg in sodium chloride 0.9 % 100 mL IVPB     100 mg 100 mL/hr over 1 Hours Intravenous Daily 12/29/14 1117     12/28/14 1200  vancomycin (VANCOCIN) IVPB 750 mg/150 ml premix  Status:  Discontinued     750 mg 150 mL/hr over 60 Minutes Intravenous Once per day on Tue Thu Sat 12/27/14 1210 12/29/14 0903   12/27/14 1800  fluconazole (DIFLUCAN) IVPB 200 mg  Status:  Discontinued     200 mg 100 mL/hr over 60 Minutes Intravenous Every 24 hours 12/27/14 1402 12/27/14 1404   12/27/14 1800  fluconazole (DIFLUCAN) IVPB 100 mg  Status:  Discontinued     100 mg 50 mL/hr over 60 Minutes Intravenous Every 24 hours  12/27/14 1404 12/29/14 1117   12/27/14 1400  piperacillin-tazobactam (ZOSYN) IVPB 3.375 g  Status:  Discontinued     3.375 g 12.5 mL/hr over 240 Minutes Intravenous 3 times per day 12/27/14 1049 12/27/14 1210   12/27/14 1300  vancomycin (VANCOCIN) 1,500 mg in sodium chloride 0.9 % 500 mL IVPB     1,500 mg 250 mL/hr over 120 Minutes Intravenous  Once 12/27/14 1210 12/27/14 1521   12/27/14 1230  piperacillin-tazobactam (ZOSYN) IVPB 3.375 g  Status:  Discontinued     3.375 g 12.5 mL/hr over 240 Minutes Intravenous Every 12 hours 12/27/14 1210 12/30/14 1527   12/27/14 1100  vancomycin (VANCOCIN) powder 1,000 mg  Status:  Discontinued     1,000 mg Other To Surgery 12/27/14 1049 12/27/14 1119   12/27/14 1100  vancomycin (VANCOCIN) IVPB 1000 mg/200 mL premix  Status:  Discontinued     1,000 mg 200 mL/hr over 60 Minutes Intravenous Every 24 hours 12/27/14 1049 12/27/14 1210   12/27/14 0000  ceFAZolin (ANCEF) IVPB 1 g/50 mL premix    Comments:  Send with pt to OR   1 g 100 mL/hr over 30 Minutes Intravenous On call 12/26/14 2208 12/28/14 0000   12/19/14 1800  vancomycin (VANCOCIN) IVPB 750 mg/150 ml premix  750 mg 150 mL/hr over 60 Minutes Intravenous  Once 12/19/14 1515 12/19/14 1940   12/15/14 0630  vancomycin (VANCOCIN) IVPB 750 mg/150 ml premix     750 mg 150 mL/hr over 60 Minutes Intravenous  Once 12/15/14 0616 12/15/14 0726   12/14/14 1300  vancomycin (VANCOCIN) IVPB 750 mg/150 ml premix  Status:  Discontinued     750 mg 150 mL/hr over 60 Minutes Intravenous Every 48 hours 12/13/14 1107 12/14/14 0604   12/14/14 0603  vancomycin (VANCOCIN) IVPB 750 mg/150 ml premix  Status:  Discontinued     750 mg 150 mL/hr over 60 Minutes Intravenous Daily PRN 12/14/14 0604 12/21/14 1110   12/09/14 0900  erythromycin 250 mg in sodium chloride 0.9 % 100 mL IVPB  Status:  Discontinued     250 mg 100 mL/hr over 60 Minutes Intravenous 3 times per day 12/09/14 0859 12/13/14 1054   12/08/14 1100   metroNIDAZOLE (FLAGYL) IVPB 500 mg  Status:  Discontinued     500 mg 100 mL/hr over 60 Minutes Intravenous Every 8 hours 12/08/14 0958 12/11/14 1048   12/08/14 0900  vancomycin (VANCOCIN) IVPB 750 mg/150 ml premix  Status:  Discontinued     750 mg 150 mL/hr over 60 Minutes Intravenous Every 24 hours 12/07/14 1102 12/12/14 0955   12/07/14 1030  cefTAZidime (FORTAZ) 2 g in dextrose 5 % 50 mL IVPB  Status:  Discontinued     2 g 100 mL/hr over 30 Minutes Intravenous Every 24 hours 12/07/14 1021 12/11/14 1048   12/07/14 0700  vancomycin (VANCOCIN) IVPB 1000 mg/200 mL premix  Status:  Discontinued     1,000 mg 200 mL/hr over 60 Minutes Intravenous Every 24 hours 12/06/14 2225 12/07/14 1102   12/06/14 2200  piperacillin-tazobactam (ZOSYN) IVPB 4.5 g  Status:  Discontinued     4.5 g 200 mL/hr over 30 Minutes Intravenous 3 times per day 12/06/14 1946 12/07/14 1011   12/06/14 2000  vancomycin (VANCOCIN) IVPB 1000 mg/200 mL premix     1,000 mg 200 mL/hr over 60 Minutes Intravenous STAT 12/06/14 1947 12/06/14 2113   12/06/14 2000  cefTRIAXone (ROCEPHIN) 1 g in dextrose 5 % 50 mL IVPB - Premix  Status:  Discontinued     1 g 100 mL/hr over 30 Minutes Intravenous Every 24 hours 12/06/14 1959 12/07/14 1011   12/06/14 1945  piperacillin-tazobactam (ZOSYN) IVPB 3.375 g  Status:  Discontinued     3.375 g 100 mL/hr over 30 Minutes Intravenous 4 times per day 12/06/14 1940 12/06/14 2007   12/06/14 1945  vancomycin (VANCOCIN) IVPB 1000 mg/200 mL premix  Status:  Discontinued     1,000 mg 200 mL/hr over 60 Minutes Intravenous Every 12 hours 12/06/14 1940 12/06/14 2008   12/01/14 0945  piperacillin-tazobactam (ZOSYN) IVPB 3.375 g  Status:  Discontinued     3.375 g 12.5 mL/hr over 240 Minutes Intravenous 3 times per day 12/01/14 0936 12/03/14 1337   11/30/14 1000  cefTRIAXone (ROCEPHIN) 2 g in dextrose 5 % 50 mL IVPB - Premix  Status:  Discontinued    Comments:  Entered per MD progress note   2 g 100 mL/hr  over 30 Minutes Intravenous Every 24 hours 11/29/14 2157 12/06/14 1940   11/29/14 1415  cefTRIAXone (ROCEPHIN) 1 g in dextrose 5 % 50 mL IVPB - Premix     1 g 100 mL/hr over 30 Minutes Intravenous  Once 11/29/14 1406 11/29/14 1452   11/29/14 1414  cefTRIAXone (ROCEPHIN) 20 MG/ML IVPB 50  mL    Comments:  MONAR, NELLIE: cabinet override      11/29/14 1414 11/29/14 1424      Medications:  Scheduled: . sodium chloride   Intravenous Once  . sodium chloride   Intravenous Once  . antiseptic oral rinse  7 mL Mouth Rinse BID  . carvedilol  20 mg Oral Daily  . dronabinol  2.5 mg Oral BID AC  . [START ON 01/02/2015] epoetin (EPOGEN/PROCRIT) injection  14,000 Units Subcutaneous Q T,Th,Sa-HD  . feeding supplement (ENSURE ENLIVE)  237 mL Oral BID BM  . feeding supplement (NEPRO CARB STEADY)  237 mL Oral BID BM  . insulin aspart  0-9 Units Subcutaneous Q6H  . micafungin (MYCAMINE) IV  100 mg Intravenous Daily  . mometasone-formoterol  2 puff Inhalation BID  . pantoprazole (PROTONIX) IV  40 mg Intravenous Q12H  . scopolamine  1 patch Transdermal Q72H  . ziprasidone  10 mg Intramuscular QHS    Objective: Vital signs in last 24 hours: Temp:  [97.8 F (36.6 C)-98.8 F (37.1 C)] 98.8 F (37.1 C) (07/20 1131) Pulse Rate:  [100-103] 100 (07/20 1131) Resp:  [18-20] 20 (07/20 1131) BP: (86-94)/(38-55) 93/49 mmHg (07/20 1131) SpO2:  [100 %] 100 % (07/20 1131) Weight:  [67.268 kg (148 lb 4.8 oz)] 67.268 kg (148 lb 4.8 oz) (07/20 0300)   evisit  Lab Results  Recent Labs  12/31/14 0455 01/01/15 0453  WBC 11.1* 11.1*  HGB 7.3* 7.3*  HCT 21.8* 21.6*  NA 137 139  K 3.1* 3.0*  CL 96* 100*  CO2 25 26  BUN 66* 38*  CREATININE 5.98* 4.31*   Liver Panel No results for input(s): PROT, ALBUMIN, AST, ALT, ALKPHOS, BILITOT, BILIDIR, IBILI in the last 72 hours. Sedimentation Rate No results for input(s): ESRSEDRATE in the last 72 hours. C-Reactive Protein No results for input(s): CRP in the  last 72 hours.  Microbiology: Recent Results (from the past 240 hour(s))  Culture, blood (routine x 2)     Status: None (Preliminary result)   Collection Time: 12/27/14 11:28 AM  Result Value Ref Range Status   Specimen Description BLOOD  Final   Special Requests Immunocompromised  Final   Culture  Setup Time   Final    YEAST AEROBIC BOTTLE ONLY CRITICAL RESULT CALLED TO, READ BACK BY AND VERIFIED WITH: CATHY SUMMERLIN ON 12/29/14 AT 1000AM BY JEF    Culture CANDIDA PARAPSILOSIS AEROBIC BOTTLE ONLY   Final   Report Status PENDING  Incomplete  Culture, blood (routine x 2)     Status: None (Preliminary result)   Collection Time: 12/27/14 11:36 AM  Result Value Ref Range Status   Specimen Description BLOOD  Final   Special Requests Immunocompromised  Final   Culture  Setup Time   Final    YEAST AEROBIC BOTTLE ONLY CRITICAL RESULT CALLED TO, READ BACK BY AND VERIFIED WITH: LISA ROMERO AT 0111 ON 12/29/14 [facility] CONFIRMED BY [place]    Culture CANDIDA PARAPSILOSIS AEROBIC BOTTLE ONLY   Final   Report Status PENDING  Incomplete  Cath Tip Culture     Status: None   Collection Time: 12/27/14  4:17 PM  Result Value Ref Range Status   Specimen Description CATH TIP  Final   Special Requests NONE  Final   Culture NO GROWTH 2 DAYS  Final   Report Status 12/30/2014 FINAL  Final  Culture, blood (routine x 2)     Status: None (Preliminary result)   Collection Time: 12/29/14  12:28 PM  Result Value Ref Range Status   Specimen Description BLOOD RIGHT ARM  Final   Special Requests   Final    BOTTLES DRAWN AEROBIC AND ANAEROBIC  AER 4CC ANA 3CC   Culture NO GROWTH 2 DAYS  Final   Report Status PENDING  Incomplete  Culture, blood (routine x 2)     Status: None (Preliminary result)   Collection Time: 12/29/14 12:28 PM  Result Value Ref Range Status   Specimen Description BLOOD RIGHT FATTY CASTS  Final   Special Requests BOTTLES DRAWN AEROBIC AND ANAEROBIC  5CC  Final   Culture NO GROWTH 2 DAYS   Final   Report Status PENDING  Incomplete    Studies/Results: No results found.   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  As long as his repeat BCx remain (-), continue micafungin for 2 weeks after port removal Will need to watch carefully with his HD line placed while he was fungemic.          Bobby Rumpf Infectious Diseases (pager) [phone] [email or web address] 01/01/2015, 4:25 PM  LOS: 33 days

## 2015-01-01 NOTE — Progress Notes (Signed)
Subjective:  Pt awake but confused this AM. Didn't recall that he had HD yesterday. Laying in bed comfortably at the moment.   Objective:  Vital signs in last 24 hours:  Temp:  [97.6 F (36.4 C)-98.8 F (37.1 C)] 97.8 F (36.6 C) (07/20 0458) Pulse Rate:  [88-103] 102 (07/20 0500) Resp:  [14-23] 18 (07/20 0458) BP: (82-100)/(38-64) 93/52 mmHg (07/20 0500) SpO2:  [98 %-100 %] 100 % (07/20 0458) Weight:  [67.268 kg (148 lb 4.8 oz)-67.722 kg (149 lb 4.8 oz)] 67.268 kg (148 lb 4.8 oz) (07/20 0300)  Weight change: 2.218 kg (4 lb 14.2 oz) Filed Weights   12/31/14 1300 12/31/14 1401 01/01/15 0300  Weight: 67.7 kg (149 lb 4 oz) 67.722 kg (149 lb 4.8 oz) 67.268 kg (148 lb 4.8 oz)    Intake/Output: I/O last 3 completed shifts: In: 3562.3 [P.O.:717; I.V.:2745.3; IV Piggyback:100] Out: 0 [Urine:500]   Intake/Output this shift:     Physical Exam: General: chronically ill appearing  Head: oral mucosa dry  Eyes: Anicteric  Neck: Supple, trachea midline  Lungs:  Clear to auscultation normal effort  Heart: S1S2 RRR  Abdomen:  Soft, NTND, BS present  Extremities: Trace LE edema  Neurologic: Awake, alert, oriented to self and place, but confused at times  Skin: No acute rashes  GU foley  Access: Right femoral dialysis catheter    Basic Metabolic Panel:  Recent Labs Lab 12/26/14 0528 12/27/14 0255 12/28/14 0531 12/29/14 0430 12/30/14 0445 12/31/14 0455 01/01/15 0453  NA 143 138 137 139 139 137 139  K 4.0 4.4 4.0 3.9 3.6 3.1* 3.0*  CL 103 101 102 101 98* 96* 100*  CO2 31 26 24 22 28 25 26   GLUCOSE 115* 195* 290* 214* 92 101* 112*  BUN 30* 44* 75* 96* 52* 66* 38*  CREATININE 2.50* 3.68* 5.33* 6.51* 4.49* 5.98* 4.31*  CALCIUM 8.3* 8.6* 7.7* 7.8* 7.5* 7.3* 7.4*  MG 1.9 1.7 1.9 2.0  --   --   --   PHOS 2.4* 1.9* 6.2* 7.6*  --   --   --     Liver Function Tests:  Recent Labs Lab 12/28/14 0531  ALBUMIN 1.9*   No results for input(s): LIPASE, AMYLASE in the last 168  hours. No results for input(s): AMMONIA in the last 168 hours.  CBC:  Recent Labs Lab 12/27/14 0255 12/28/14 0531 12/29/14 0430 12/30/14 0445 12/31/14 0455 01/01/15 0453  WBC 12.4* 11.5* 14.1* 12.0* 11.1* 11.1*  NEUTROABS 8.8*  --   --   --   --  7.9*  HGB 7.1* 6.3* 7.0* 7.8* 7.3* 7.3*  HCT 22.0* 19.4* 21.4* 22.8* 21.8* 21.6*  MCV 88.1 87.8 87.5 84.4 83.4 84.1  PLT 158 106* 81* 69* 58* 56*    Cardiac Enzymes: No results for input(s): CKTOTAL, CKMB, CKMBINDEX, TROPONINI in the last 168 hours.  BNP: Invalid input(s): POCBNP  CBG:  Recent Labs Lab 12/31/14 0741 12/31/14 1401 12/31/14 2006 01/01/15 0232 01/01/15 0726  GLUCAP 95 79 183* 121* 115*    Microbiology: Results for orders placed or performed during the hospital encounter of 11/29/14  MRSA PCR Screening     Status: None   Collection Time: 11/29/14 12:13 PM  Result Value Ref Range Status   MRSA by PCR NEGATIVE NEGATIVE Final    Comment:        The GeneXpert MRSA Assay (FDA approved for NASAL specimens only), is one component of a comprehensive MRSA colonization surveillance program. It is not intended  to diagnose MRSA infection nor to guide or monitor treatment for MRSA infections.   Culture, blood (routine x 2)     Status: None   Collection Time: 11/29/14  1:19 PM  Result Value Ref Range Status   Specimen Description BLOOD  Final   Special Requests Immunocompromised  Final   Culture NO GROWTH 5 DAYS  Final   Report Status 12/04/2014 FINAL  Final  Urine culture     Status: None   Collection Time: 11/30/14 10:12 AM  Result Value Ref Range Status   Specimen Description URINE, CATHETERIZED  Final   Special Requests Immunocompromised  Final   Culture NO GROWTH 2 DAYS  Final   Report Status 12/02/2014 FINAL  Final  Culture, blood (routine x 2)     Status: None   Collection Time: 12/06/14  8:08 PM  Result Value Ref Range Status   Specimen Description BLOOD  Final   Special Requests NONE  Final    Culture  Setup Time   Final    GRAM POSITIVE COCCI IN BOTH AEROBIC AND ANAEROBIC BOTTLES CRITICAL RESULT CALLED TO, READ BACK BY AND VERIFIED WITH: JENNIFER BEZARD AT 1448 12/08/14.PMH CONFIRMED BY [facility]    Culture   Final    STAPHYLOCOCCUS AURICULARIS IN BOTH AEROBIC AND ANAEROBIC BOTTLES    Report Status 12/11/2014 FINAL  Final   Organism ID, Bacteria STAPHYLOCOCCUS AURICULARIS  Final      Susceptibility   Staphylococcus auricularis - MIC*    CIPROFLOXACIN >=8 RESISTANT Resistant     ERYTHROMYCIN >=8 RESISTANT Resistant     GENTAMICIN <=0.5 SENSITIVE Sensitive     OXACILLIN >=4 RESISTANT Resistant     TETRACYCLINE <=1 SENSITIVE Sensitive     VANCOMYCIN 1 SENSITIVE Sensitive     CLINDAMYCIN <=0.25 SENSITIVE Sensitive     TRIMETH/SULFA Value in next row Sensitive      SENSITIVE<=20    LEVOFLOXACIN Value in next row Intermediate      INTERMEDIATE4    * STAPHYLOCOCCUS AURICULARIS  Culture, blood (routine x 2)     Status: None   Collection Time: 12/06/14  8:40 PM  Result Value Ref Range Status   Specimen Description BLOOD  Final   Special Requests NONE  Final   Culture NO GROWTH 5 DAYS  Final   Report Status 12/11/2014 FINAL  Final  Culture, blood (routine x 2)     Status: None   Collection Time: 12/08/14 11:40 AM  Result Value Ref Range Status   Specimen Description BLOOD  Final   Special Requests Normal  Final   Culture NO GROWTH 6 DAYS  Final   Report Status 12/14/2014 FINAL  Final  Culture, blood (routine x 2)     Status: None   Collection Time: 12/08/14 11:49 AM  Result Value Ref Range Status   Specimen Description BLOOD  Final   Special Requests Normal  Final   Culture NO GROWTH 6 DAYS  Final   Report Status 12/14/2014 FINAL  Final  C difficile quick scan w PCR reflex ([facility] only)     Status: None   Collection Time: 12/14/14 10:11 AM  Result Value Ref Range Status   C Diff antigen NEGATIVE  Final   C Diff toxin NEGATIVE  Final   C Diff interpretation Negative for C.  difficile  Final  Culture, blood (routine x 2)     Status: None (Preliminary result)   Collection Time: 12/27/14 11:28 AM  Result Value Ref Range Status  Specimen Description BLOOD  Final   Special Requests Immunocompromised  Final   Culture  Setup Time   Final    YEAST AEROBIC BOTTLE ONLY CRITICAL RESULT CALLED TO, READ BACK BY AND VERIFIED WITH: CATHY SUMMERLIN ON 12/29/14 AT 1000AM BY JEF    Culture CANDIDA PARAPSILOSIS AEROBIC BOTTLE ONLY   Final   Report Status PENDING  Incomplete  Culture, blood (routine x 2)     Status: None (Preliminary result)   Collection Time: 12/27/14 11:36 AM  Result Value Ref Range Status   Specimen Description BLOOD  Final   Special Requests Immunocompromised  Final   Culture  Setup Time   Final    YEAST AEROBIC BOTTLE ONLY CRITICAL RESULT CALLED TO, READ BACK BY AND VERIFIED WITH: LISA ROMERO AT 0111 ON 12/29/14 [facility] CONFIRMED BY [place]    Culture CANDIDA PARAPSILOSIS AEROBIC BOTTLE ONLY   Final   Report Status PENDING  Incomplete  Cath Tip Culture     Status: None   Collection Time: 12/27/14  4:17 PM  Result Value Ref Range Status   Specimen Description CATH TIP  Final   Special Requests NONE  Final   Culture NO GROWTH 2 DAYS  Final   Report Status 12/30/2014 FINAL  Final  Culture, blood (routine x 2)     Status: None (Preliminary result)   Collection Time: 12/29/14 12:28 PM  Result Value Ref Range Status   Specimen Description BLOOD RIGHT ARM  Final   Special Requests   Final    BOTTLES DRAWN AEROBIC AND ANAEROBIC  AER 4CC ANA 3CC   Culture NO GROWTH 2 DAYS  Final   Report Status PENDING  Incomplete  Culture, blood (routine x 2)     Status: None (Preliminary result)   Collection Time: 12/29/14 12:28 PM  Result Value Ref Range Status   Specimen Description BLOOD RIGHT FATTY CASTS  Final   Special Requests BOTTLES DRAWN AEROBIC AND ANAEROBIC  5CC  Final   Culture NO GROWTH 2 DAYS  Final   Report Status PENDING  Incomplete    Coagulation  Studies: No results for input(s): LABPROT, INR in the last 72 hours.  Urinalysis: No results for input(s): COLORURINE, LABSPEC, PHURINE, GLUCOSEU, HGBUR, BILIRUBINUR, KETONESUR, PROTEINUR, UROBILINOGEN, NITRITE, LEUKOCYTESUR in the last 72 hours.  Invalid input(s): APPERANCEUR    Imaging: No results found.   Medications:   . amiodarone 30 mg/hr (12/31/14 1900)  . dextrose 5 % and 0.45% NaCl 60 mL/hr at 12/31/14 1900   . sodium chloride   Intravenous Once  . sodium chloride   Intravenous Once  . antiseptic oral rinse  7 mL Mouth Rinse BID  . carvedilol  20 mg Oral Daily  . dronabinol  2.5 mg Oral BID AC  . feeding supplement (ENSURE ENLIVE)  237 mL Oral BID BM  . feeding supplement (NEPRO CARB STEADY)  237 mL Oral BID BM  . insulin aspart  0-9 Units Subcutaneous Q6H  . micafungin (MYCAMINE) IV  100 mg Intravenous Daily  . mometasone-formoterol  2 puff Inhalation BID  . pantoprazole (PROTONIX) IV  40 mg Intravenous Q12H  . potassium chloride  10 mEq Intravenous Q1 Hr x 4  . scopolamine  1 patch Transdermal Q72H  . ziprasidone  10 mg Intramuscular QHS   acetaminophen **OR** acetaminophen, albuterol, haloperidol lactate, lidocaine (PF), lidocaine-prilocaine, magnesium hydroxide, metoprolol, morphine injection, ondansetron (ZOFRAN) IV, promethazine, sodium chloride, sodium chloride  Assessment/ Plan:  Mr. Covin is a 6 black male with  progressive mantle cell lymphoma, RICE chemo in 11/2014, hx left sided hydronephrosis and hydroureter. S/p ureteral stent placement   Hospital course complicated by sepsis with Klebsiella oxytoca, hypernatremia. Now with fungemia.   1.  Acute renal failure due to ATN: Pt has undergone multiple dialysis sessions.  Mental status does appear slightly improved after dialysis.  - remains oliguric at the moment, pt had HD yesterday, no urgent indication for HD today, will plan to perform HD again tomorrow.  2.  Sepsis with fungemia. I41.9, B49. Status  post bacteremia with klebsiella oxytoca: completed two weeks for IV ceftazidime. Subsequently growing candida parapsilosis. Glori Luis cath to be removed, to be performed by surgery or vascular, recently had HD catheter replaced.  Continue micafungin.   3.  Anemia of chronic kidney disease/mantle cell lymphoma:  Discussed case with Dr. Mike Gip, she is ok with starting at patient on epogen. Will start epogen 14000 units IV with HD.   LOS: [place], Brysan Mcevoy 7/20/20169:15 AM

## 2015-01-01 NOTE — Care Management (Addendum)
Spoke with patient's son Terril Chestnut by phone.  Informed him that patient's insurance has denied LTAC referral.  Discussed that no verbal reason given and a written notice will follow.  Discussed that could be that patient has/is not showing consistent progressive progress.  Giavanni stated that would not need to appeal as family is not on board with this level of care.  Verbalizes that does not understand why patient improves then declines- improves then declines.  He wonders if it is because "doctors switch off."  Relayed that "a new doctor told me Sunday my Dad was dying and look at him now."    He relays that this care manager has not seen patient all through his icu stay and how sick patient became then improved.Discussed patient's nutritional status and that patient is taking very little oral nutrition.  Riku says that patient is receiving meds to stimulate patient's appetite and does not feel that physicians share, are aware of what patient is on or  document this information.  Discussed that patient is receiving ensure and nephro carb steady as supplements and marinol to stimulate appetite and this information is available for all physicians and staff to see, but this very specific information may not always be specifically documented in each physician note.   Zebedee verbalizes that he has been informed by physicians that "patient can not live here."  Informed Ianmichael that patient is not medically stable for discharge as long as aggressive treatment is pursued.  Jep says that he and his family are not giving up, that they want everything done so patient will get well.  Asked Dace to consider what if this is the patient's new baseline line- what would family want for the patient.  Shiheem is very firm in his statement that family is not accepting that this will be patient's baseline.  They are looking for the patient to get well and recover and do not understand why everyone is trying  to "push patient out." Informed Sie that facility, physicians and staff are not trying to push patient out but making every effort to progress patient as far as possible. Sometimes that means looking at other care options. Taeveon did not seem aware that patient temporary dialysis catheter site was changed.  CM informed him that per Op note- it was changed 7/15 and unable to purse permcath due to current infection.   Patient's albumin is down to 1.9.  If family is indeed wishing to be aggressive with treatment, need to consider overall nutritional status.  TPN has been discontinued.  A calorie count was started at the end of last week but then patient was made NPO. Discussed during progression to have this reinstated if it has not already been done. It is still planned for patient's  port to be removed to see if it is the source of patient new fungal infection in his blood.

## 2015-01-01 NOTE — Care Management Important Message (Signed)
Important Message  Patient Details  Name: Caleb Francis MRN: [id number] Date of Birth: [birth year]   Medicare Important Message Given:  Yes-third notification given    [name] 01/01/2015, 9:50 AM

## 2015-01-01 NOTE — Progress Notes (Signed)
Notified Dr. Marcille Blanco of potassium level of 3.0. IV K ordered.

## 2015-01-01 NOTE — Consult Note (Signed)
Reason for Consult: confusion Referring Physician: Dr. Lacretia Francis is an 69 y.o. male.  HPI: seen at request of Caleb Francis for confusion;  69 yo RHD M who presents to [facility] due to labs per his son.  Son is at bedside and adamant that he is still the best he has been in a long time as far as mental status.  Pt is also known to me as well and I agree that he is functioning very well compared to how he was three weeks ago.  Son is not happy about the lack of continuity at this hospital and would prefer to give him antibiotics at home if that is sufficient.    Past Medical History  Diagnosis Date  . Arthritis   . Hypertension   . RA (rheumatoid arthritis)   . Anemia   . Mantle cell lymphoma     Caleb Mike Francis  . History of nuclear stress test     a. 12/2013: low risk, no sig ischemia, no EKG changes, no artifact, EF 63%  . Chronic kidney disease   . Sepsis     Caleb Francis    Past Surgical History  Procedure Laterality Date  . Back surgery    . Fracture surgery      ankle  . Portacath placement    . Cystoscopy w/ ureteral stent placement Left 10/24/2014    Procedure: CYSTOSCOPY WITH RETROGRADE PYELOGRAM/URETERAL STENT PLACEMENT;  Surgeon: Irine Seal, MD;  Location: [facility] ORS;  Service: Urology;  Laterality: Left;    Family History  Problem Relation Age of Onset  . Cancer Mother     breast  . Cancer Father     bone cancer  . Colon cancer Neg Hx   . Liver disease Neg Hx     Social History:  reports that he quit smoking about 2 months ago. His smoking use included Cigarettes. He has a 30 pack-year smoking history. He does not have any smokeless tobacco history on file. He reports that he does not drink alcohol or use illicit drugs.  Allergies: No Known Allergies  Medications: personally reviewed by me as per chart  Results for orders placed or performed during the hospital encounter of 11/29/14 (from the past 48 hour(s))  Glucose, capillary     Status: Abnormal    Collection Time: 12/30/14  8:13 PM  Result Value Ref Range   Glucose-Capillary 108 (H) 65 - 99 mg/dL  Glucose, capillary     Status: Abnormal   Collection Time: 12/30/14  9:15 PM  Result Value Ref Range   Glucose-Capillary 123 (H) 65 - 99 mg/dL  Glucose, capillary     Status: Abnormal   Collection Time: 12/31/14 12:03 AM  Result Value Ref Range   Glucose-Capillary 117 (H) 65 - 99 mg/dL  Glucose, capillary     Status: Abnormal   Collection Time: 12/31/14  2:58 AM  Result Value Ref Range   Glucose-Capillary 108 (H) 65 - 99 mg/dL  CBC     Status: Abnormal   Collection Time: 12/31/14  4:55 AM  Result Value Ref Range   WBC 11.1 (H) 3.8 - 10.6 K/uL   RBC 2.62 (L) 4.40 - 5.90 MIL/uL   Hemoglobin 7.3 (L) 13.0 - 18.0 g/dL   HCT 21.8 (L) 40.0 - 52.0 %   MCV 83.4 80.0 - 100.0 fL   MCH 27.8 26.0 - 34.0 pg   MCHC 33.3 32.0 - 36.0 g/dL   RDW 18.4 (H) 11.5 -  14.5 %   Platelets 58 (L) 150 - 440 K/uL  Basic metabolic panel     Status: Abnormal   Collection Time: 12/31/14  4:55 AM  Result Value Ref Range   Sodium 137 135 - 145 mmol/L   Potassium 3.1 (L) 3.5 - 5.1 mmol/L   Chloride 96 (L) 101 - 111 mmol/L   CO2 25 22 - 32 mmol/L   Glucose, Bld 101 (H) 65 - 99 mg/dL   BUN 66 (H) 6 - 20 mg/dL   Creatinine, Ser 5.98 (H) 0.61 - 1.24 mg/dL   Calcium 7.3 (L) 8.9 - 10.3 mg/dL   GFR calc non Af Amer 9 (L) >60 mL/min   GFR calc Af Amer 10 (L) >60 mL/min    Comment: (NOTE) The eGFR has been calculated using the CKD EPI equation. This calculation has not been validated in all clinical situations. eGFR's persistently <60 mL/min signify possible Chronic Kidney Disease.    Anion gap 16 (H) 5 - 15  Glucose, capillary     Status: None   Collection Time: 12/31/14  6:01 AM  Result Value Ref Range   Glucose-Capillary 93 65 - 99 mg/dL  Glucose, capillary     Status: None   Collection Time: 12/31/14  7:41 AM  Result Value Ref Range   Glucose-Capillary 95 65 - 99 mg/dL   Comment 1 Notify RN    Vancomycin, trough     Status: None   Collection Time: 12/31/14 10:14 AM  Result Value Ref Range   Vancomycin Tr 20 10 - 20 ug/mL  Glucose, capillary     Status: None   Collection Time: 12/31/14  2:01 PM  Result Value Ref Range   Glucose-Capillary 79 65 - 99 mg/dL   Comment 1 Notify RN    Comment 2 Document in Chart   Glucose, capillary     Status: Abnormal   Collection Time: 12/31/14  8:06 PM  Result Value Ref Range   Glucose-Capillary 183 (H) 65 - 99 mg/dL  Glucose, capillary     Status: Abnormal   Collection Time: 01/01/15  2:32 AM  Result Value Ref Range   Glucose-Capillary 121 (H) 65 - 99 mg/dL  Basic metabolic panel     Status: Abnormal   Collection Time: 01/01/15  4:53 AM  Result Value Ref Range   Sodium 139 135 - 145 mmol/L   Potassium 3.0 (L) 3.5 - 5.1 mmol/L   Chloride 100 (L) 101 - 111 mmol/L   CO2 26 22 - 32 mmol/L   Glucose, Bld 112 (H) 65 - 99 mg/dL   BUN 38 (H) 6 - 20 mg/dL   Creatinine, Ser 4.31 (H) 0.61 - 1.24 mg/dL   Calcium 7.4 (L) 8.9 - 10.3 mg/dL   GFR calc non Af Amer 13 (L) >60 mL/min   GFR calc Af Amer 15 (L) >60 mL/min    Comment: (NOTE) The eGFR has been calculated using the CKD EPI equation. This calculation has not been validated in all clinical situations. eGFR's persistently <60 mL/min signify possible Chronic Kidney Disease.    Anion gap 13 5 - 15  CBC with Differential     Status: Abnormal   Collection Time: 01/01/15  4:53 AM  Result Value Ref Range   WBC 11.1 (H) 3.8 - 10.6 K/uL   RBC 2.56 (L) 4.40 - 5.90 MIL/uL   Hemoglobin 7.3 (L) 13.0 - 18.0 g/dL   HCT 21.6 (L) 40.0 - 52.0 %   MCV 84.1 80.0 -  100.0 fL   MCH 28.3 26.0 - 34.0 pg   MCHC 33.7 32.0 - 36.0 g/dL   RDW 18.0 (H) 11.5 - 14.5 %   Platelets 56 (L) 150 - 440 K/uL   Neutrophils Relative % 69 43 - 77 %   Lymphocytes Relative 13 12 - 46 %   Monocytes Relative 10 3 - 12 %   Eosinophils Relative 6 (H) 0 - 5 %   Basophils Relative 0 0 - 1 %   Band Neutrophils 2 0 - 10 %    Metamyelocytes Relative 0 %   Myelocytes 0 %   Promyelocytes Absolute 0 %   Blasts 0 %   nRBC 0 0 /100 WBC   Other 0 %   Neutro Abs 7.9 (H) 1.7 - 7.7 K/uL   Lymphs Abs 1.4 0.7 - 4.0 K/uL   Monocytes Absolute 1.1 (H) 0.1 - 1.0 K/uL   Eosinophils Absolute 0.7 0.0 - 0.7 K/uL   Basophils Absolute 0.0 0.0 - 0.1 K/uL  Glucose, capillary     Status: Abnormal   Collection Time: 01/01/15  7:26 AM  Result Value Ref Range   Glucose-Capillary 115 (H) 65 - 99 mg/dL  Glucose, capillary     Status: Abnormal   Collection Time: 01/01/15 11:33 AM  Result Value Ref Range   Glucose-Capillary 146 (H) 65 - 99 mg/dL  Glucose, capillary     Status: Abnormal   Collection Time: 01/01/15  3:05 PM  Result Value Ref Range   Glucose-Capillary 168 (H) 65 - 99 mg/dL  Glucose, capillary     Status: Abnormal   Collection Time: 01/01/15  4:53 PM  Result Value Ref Range   Glucose-Capillary 149 (H) 65 - 99 mg/dL    No results found.  Review of Systems  Constitutional: Positive for weight loss and malaise/fatigue. Negative for fever, chills and diaphoresis.  HENT: Negative.   Eyes: Negative.   Respiratory: Negative.   Cardiovascular: Negative.   Gastrointestinal: Negative.   Genitourinary: Negative.   Musculoskeletal: Negative.   Skin: Negative.   Neurological: Positive for weakness. Negative for speech change and focal weakness.   Blood pressure 93/49, pulse 100, temperature 98.8 F (37.1 C), temperature source Oral, resp. rate 20, height 6' (1.829 m), weight 67.268 kg (148 lb 4.8 oz), SpO2 100 %. Physical Exam  Constitutional: He appears well-developed and well-nourished. No distress.  HENT:  Head: Normocephalic and atraumatic.  Nose: Nose normal.  Mouth/Throat: Oropharynx is clear and moist.  Eyes: EOM are normal. Pupils are equal, round, and reactive to light.  Neck: Normal range of motion. Neck supple.  Cardiovascular: Normal rate, regular rhythm and normal heart sounds.   Respiratory: Effort  normal and breath sounds normal.  GI: Soft. Bowel sounds are normal.  Musculoskeletal: Normal range of motion.  Neurological: He is alert. He has normal strength. No cranial nerve deficit or sensory deficit.  Alert and oriented x 2 not time, mild dysarthria, good naming and no aphasia, follows all commands PERRLA, EOMI, nl VF, face symmetric 4/5 B, nl tone 1+/4 B, mute plantars  Nl sensation to light touch    Assessment/Plan: 1.  Encephalopathy-  Mild and much improved from previous state several weeks ago and is likely at a new baseline;  This is confounded by pt's active infection which can decrease his mental status as well. -  Continue all antibiotics -  PRN Geodon ok for agitation -  OK to continue Marinol for appetite -  Would suggest calorie  count and supplementation as needed -  Will sign off, please call with questions -  No neurology f/u needed at this time [facility], Rodman Key 01/01/2015, 7:57 PM

## 2015-01-02 ENCOUNTER — Inpatient Hospital Stay: Payer: Medicare Other

## 2015-01-02 ENCOUNTER — Encounter
Admission: EM | Disposition: A | Discharge status: Skilled Nursing Facility | Payer: Self-pay | Source: Home / Self Care | Attending: Internal Medicine

## 2015-01-02 HISTORY — PX: PERIPHERAL VASCULAR CATHETERIZATION: SHX172C

## 2015-01-02 LAB — BASIC METABOLIC PANEL
ANION GAP: 10 (ref 5–15)
BUN: 49 mg/dL — ABNORMAL HIGH (ref 6–20)
CALCIUM: 6.9 mg/dL — AB (ref 8.9–10.3)
CO2: 26 mmol/L (ref 22–32)
Chloride: 100 mmol/L — ABNORMAL LOW (ref 101–111)
Creatinine, Ser: 5.48 mg/dL — ABNORMAL HIGH (ref 0.61–1.24)
GFR calc Af Amer: 11 mL/min — ABNORMAL LOW (ref >60)
GFR calc non Af Amer: 10 mL/min — ABNORMAL LOW (ref >60)
Glucose, Bld: 98 mg/dL (ref 65–99)
Potassium: 2.8 mmol/L — CL (ref 3.5–5.1)
Sodium: 136 mmol/L (ref 135–145)

## 2015-01-02 LAB — CULTURE, BLOOD (ROUTINE X 2)

## 2015-01-02 LAB — GLUCOSE, CAPILLARY
GLUCOSE-CAPILLARY: 91 mg/dL (ref 65–99)
GLUCOSE-CAPILLARY: 97 mg/dL (ref 65–99)
Glucose-Capillary: 132 mg/dL — ABNORMAL HIGH (ref 65–99)
Glucose-Capillary: 194 mg/dL — ABNORMAL HIGH (ref 65–99)

## 2015-01-02 LAB — CBC
HCT: 22.2 % — ABNORMAL LOW (ref 40.0–52.0)
Hemoglobin: 7.4 g/dL — ABNORMAL LOW (ref 13.0–18.0)
MCH: 28.1 pg (ref 26.0–34.0)
MCHC: 33.3 g/dL (ref 32.0–36.0)
MCV: 84.4 fL (ref 80.0–100.0)
PLATELETS: 66 10*3/uL — AB (ref 150–440)
RBC: 2.63 MIL/uL — AB (ref 4.40–5.90)
RDW: 18.6 % — ABNORMAL HIGH (ref 11.5–14.5)
WBC: 14.5 10*3/uL — ABNORMAL HIGH (ref 3.8–10.6)

## 2015-01-02 LAB — PHOSPHORUS: PHOSPHORUS: 2.3 mg/dL — AB (ref 2.5–4.6)

## 2015-01-02 SURGERY — PORTA CATH REMOVAL
Anesthesia: Moderate Sedation

## 2015-01-02 MED ORDER — LIDOCAINE-EPINEPHRINE (PF) 1 %-1:200000 IJ SOLN
INTRAMUSCULAR | Status: AC
  Filled 2015-01-02: qty 30

## 2015-01-02 MED ORDER — CHLORHEXIDINE GLUCONATE CLOTH 2 % EX PADS: 6.0000 | MEDICATED_PAD | Freq: Once | CUTANEOUS | Status: DC

## 2015-01-02 MED ORDER — MIDAZOLAM HCL 2 MG/2ML IJ SOLN
INTRAMUSCULAR | Status: AC
Start: 2015-01-02 — End: 2015-01-02
  Filled 2015-01-02: qty 2

## 2015-01-02 MED ORDER — SODIUM CHLORIDE 0.9 % IV SOLN: INTRAVENOUS | Status: DC

## 2015-01-02 MED ORDER — MIDAZOLAM HCL 2 MG/2ML IJ SOLN
INTRAMUSCULAR | Status: DC | PRN
  Administered 2015-01-02: 1 mg via INTRAVENOUS

## 2015-01-02 MED ORDER — POTASSIUM CHLORIDE 10 MEQ/100ML IV SOLN
10.0000 meq | INTRAVENOUS | Status: DC
  Administered 2015-01-02 (×3): 10 meq via INTRAVENOUS
  Filled 2015-01-02 (×4): qty 100

## 2015-01-02 MED ORDER — CEFAZOLIN SODIUM 1-5 GM-% IV SOLN
1.0000 g | Freq: Once | INTRAVENOUS | Status: AC
  Administered 2015-01-02: 1 g via INTRAVENOUS

## 2015-01-02 MED ORDER — CEFAZOLIN SODIUM 1-5 GM-% IV SOLN
INTRAVENOUS | Status: AC
  Filled 2015-01-02: qty 50

## 2015-01-02 SURGICAL SUPPLY — 9 items
DERMABOND ADVANCED (GAUZE/BANDAGES/DRESSINGS) ×2
DERMABOND ADVANCED .7 DNX12 (GAUZE/BANDAGES/DRESSINGS) ×1 IMPLANT
DRAPE INCISE IOBAN 66X45 STRL (DRAPES) ×3 IMPLANT
ELECT CAUTERY BLADE 6.4 (BLADE) ×3 IMPLANT
PACK ANGIOGRAPHY (CUSTOM PROCEDURE TRAY) ×3 IMPLANT
PAD GROUND ADULT SPLIT (MISCELLANEOUS) ×3 IMPLANT
SUT MNCRL AB 4-0 PS2 18 (SUTURE) ×3 IMPLANT
SUTURE VIC 3-0 (SUTURE) ×3 IMPLANT
TOWEL OR 17X26 4PK STRL BLUE (TOWEL DISPOSABLE) ×3 IMPLANT

## 2015-01-02 NOTE — Care Management (Addendum)
Patient's port was surgically removed today.  There will be a care team meeting with family 7/21

## 2015-01-02 NOTE — Progress Notes (Signed)
INFECTIOUS DISEASE PROGRESS NOTE  ID: Caleb Francis is a 69 y.o. male with  Principal Problem:   Fungemia Active Problems:   Mantle cell lymphoma   Sepsis   Thrombocytopenia   Absolute anemia   Rheumatoid arthritis with rheumatoid factor   Altered mental status   Chest pain   Metabolic encephalopathy   Ileus   Fecal impaction   Protein-calorie malnutrition, severe   Ogilvie's syndrome   Pseudo-obstruction of colon   Pressure ulcer   Acute renal insufficiency  Subjective: E-visit  Abtx:  Anti-infectives    Start     Dose/Rate Route Frequency Ordered Stop   12/31/14 0000  vancomycin (VANCOCIN) IVPB 750 mg/150 ml premix  Status:  Discontinued     750 mg 150 mL/hr over 60 Minutes Intravenous Once per day on Mon Thu Sat 12/30/14 1126 12/30/14 1131   12/30/14 1200  vancomycin (VANCOCIN) IVPB 750 mg/150 ml premix  Status:  Discontinued     750 mg 150 mL/hr over 60 Minutes Intravenous Once per day on Mon Thu Sat 12/29/14 0903 12/30/14 1126   12/30/14 1129  vancomycin (VANCOCIN) IVPB 750 mg/150 ml premix  Status:  Discontinued    Comments:  Administer dose during last hour of dialysis as long as pre-dialysis random level is less then 20 mcg/mL   750 mg 150 mL/hr over 60 Minutes Intravenous Every Dialysis 12/30/14 1131 12/30/14 1527   12/29/14 1130  micafungin (MYCAMINE) 100 mg in sodium chloride 0.9 % 100 mL IVPB     100 mg 100 mL/hr over 1 Hours Intravenous Daily 12/29/14 1117     12/28/14 1200  vancomycin (VANCOCIN) IVPB 750 mg/150 ml premix  Status:  Discontinued     750 mg 150 mL/hr over 60 Minutes Intravenous Once per day on Tue Thu Sat 12/27/14 1210 12/29/14 0903   12/27/14 1800  fluconazole (DIFLUCAN) IVPB 200 mg  Status:  Discontinued     200 mg 100 mL/hr over 60 Minutes Intravenous Every 24 hours 12/27/14 1402 12/27/14 1404   12/27/14 1800  fluconazole (DIFLUCAN) IVPB 100 mg  Status:  Discontinued     100 mg 50 mL/hr over 60 Minutes Intravenous Every 24 hours  12/27/14 1404 12/29/14 1117   12/27/14 1400  piperacillin-tazobactam (ZOSYN) IVPB 3.375 g  Status:  Discontinued     3.375 g 12.5 mL/hr over 240 Minutes Intravenous 3 times per day 12/27/14 1049 12/27/14 1210   12/27/14 1300  vancomycin (VANCOCIN) 1,500 mg in sodium chloride 0.9 % 500 mL IVPB     1,500 mg 250 mL/hr over 120 Minutes Intravenous  Once 12/27/14 1210 12/27/14 1521   12/27/14 1230  piperacillin-tazobactam (ZOSYN) IVPB 3.375 g  Status:  Discontinued     3.375 g 12.5 mL/hr over 240 Minutes Intravenous Every 12 hours 12/27/14 1210 12/30/14 1527   12/27/14 1100  vancomycin (VANCOCIN) powder 1,000 mg  Status:  Discontinued     1,000 mg Other To Surgery 12/27/14 1049 12/27/14 1119   12/27/14 1100  vancomycin (VANCOCIN) IVPB 1000 mg/200 mL premix  Status:  Discontinued     1,000 mg 200 mL/hr over 60 Minutes Intravenous Every 24 hours 12/27/14 1049 12/27/14 1210   12/27/14 0000  ceFAZolin (ANCEF) IVPB 1 g/50 mL premix    Comments:  Send with pt to OR   1 g 100 mL/hr over 30 Minutes Intravenous On call 12/26/14 2208 12/28/14 0000   12/19/14 1800  vancomycin (VANCOCIN) IVPB 750 mg/150 ml premix  750 mg 150 mL/hr over 60 Minutes Intravenous  Once 12/19/14 1515 12/19/14 1940   12/15/14 0630  vancomycin (VANCOCIN) IVPB 750 mg/150 ml premix     750 mg 150 mL/hr over 60 Minutes Intravenous  Once 12/15/14 0616 12/15/14 0726   12/14/14 1300  vancomycin (VANCOCIN) IVPB 750 mg/150 ml premix  Status:  Discontinued     750 mg 150 mL/hr over 60 Minutes Intravenous Every 48 hours 12/13/14 1107 12/14/14 0604   12/14/14 0603  vancomycin (VANCOCIN) IVPB 750 mg/150 ml premix  Status:  Discontinued     750 mg 150 mL/hr over 60 Minutes Intravenous Daily PRN 12/14/14 0604 12/21/14 1110   12/09/14 0900  erythromycin 250 mg in sodium chloride 0.9 % 100 mL IVPB  Status:  Discontinued     250 mg 100 mL/hr over 60 Minutes Intravenous 3 times per day 12/09/14 0859 12/13/14 1054   12/08/14 1100   metroNIDAZOLE (FLAGYL) IVPB 500 mg  Status:  Discontinued     500 mg 100 mL/hr over 60 Minutes Intravenous Every 8 hours 12/08/14 0958 12/11/14 1048   12/08/14 0900  vancomycin (VANCOCIN) IVPB 750 mg/150 ml premix  Status:  Discontinued     750 mg 150 mL/hr over 60 Minutes Intravenous Every 24 hours 12/07/14 1102 12/12/14 0955   12/07/14 1030  cefTAZidime (FORTAZ) 2 g in dextrose 5 % 50 mL IVPB  Status:  Discontinued     2 g 100 mL/hr over 30 Minutes Intravenous Every 24 hours 12/07/14 1021 12/11/14 1048   12/07/14 0700  vancomycin (VANCOCIN) IVPB 1000 mg/200 mL premix  Status:  Discontinued     1,000 mg 200 mL/hr over 60 Minutes Intravenous Every 24 hours 12/06/14 2225 12/07/14 1102   12/06/14 2200  piperacillin-tazobactam (ZOSYN) IVPB 4.5 g  Status:  Discontinued     4.5 g 200 mL/hr over 30 Minutes Intravenous 3 times per day 12/06/14 1946 12/07/14 1011   12/06/14 2000  vancomycin (VANCOCIN) IVPB 1000 mg/200 mL premix     1,000 mg 200 mL/hr over 60 Minutes Intravenous STAT 12/06/14 1947 12/06/14 2113   12/06/14 2000  cefTRIAXone (ROCEPHIN) 1 g in dextrose 5 % 50 mL IVPB - Premix  Status:  Discontinued     1 g 100 mL/hr over 30 Minutes Intravenous Every 24 hours 12/06/14 1959 12/07/14 1011   12/06/14 1945  piperacillin-tazobactam (ZOSYN) IVPB 3.375 g  Status:  Discontinued     3.375 g 100 mL/hr over 30 Minutes Intravenous 4 times per day 12/06/14 1940 12/06/14 2007   12/06/14 1945  vancomycin (VANCOCIN) IVPB 1000 mg/200 mL premix  Status:  Discontinued     1,000 mg 200 mL/hr over 60 Minutes Intravenous Every 12 hours 12/06/14 1940 12/06/14 2008   12/01/14 0945  piperacillin-tazobactam (ZOSYN) IVPB 3.375 g  Status:  Discontinued     3.375 g 12.5 mL/hr over 240 Minutes Intravenous 3 times per day 12/01/14 0936 12/03/14 1337   11/30/14 1000  cefTRIAXone (ROCEPHIN) 2 g in dextrose 5 % 50 mL IVPB - Premix  Status:  Discontinued    Comments:  Entered per MD progress note   2 g 100 mL/hr  over 30 Minutes Intravenous Every 24 hours 11/29/14 2157 12/06/14 1940   11/29/14 1415  cefTRIAXone (ROCEPHIN) 1 g in dextrose 5 % 50 mL IVPB - Premix     1 g 100 mL/hr over 30 Minutes Intravenous  Once 11/29/14 1406 11/29/14 1452   11/29/14 1414  cefTRIAXone (ROCEPHIN) 20 MG/ML IVPB 50  mL    Comments:  MONAR, NELLIE: cabinet override      11/29/14 1414 11/29/14 1424      Medications:  Scheduled: . antiseptic oral rinse  7 mL Mouth Rinse BID  . carvedilol  20 mg Oral Daily  . dronabinol  2.5 mg Oral BID AC  . epoetin (EPOGEN/PROCRIT) injection  14,000 Units Subcutaneous Q T,Th,Sa-HD  . feeding supplement (ENSURE ENLIVE)  237 mL Oral BID BM  . feeding supplement (NEPRO CARB STEADY)  237 mL Oral BID BM  . insulin aspart  0-9 Units Subcutaneous Q6H  . micafungin (MYCAMINE) IV  100 mg Intravenous Daily  . mometasone-formoterol  2 puff Inhalation BID  . pantoprazole (PROTONIX) IV  40 mg Intravenous Q12H  . scopolamine  1 patch Transdermal Q72H  . ziprasidone  10 mg Intramuscular QHS    Objective: Vital signs in last 24 hours: Temp:  [98.8 F (37.1 C)-99.2 F (37.3 C)] 99.2 F (37.3 C) (07/21 0510) Pulse Rate:  [100-105] 105 (07/21 0510) Resp:  [18-20] 20 (07/21 0510) BP: (93-111)/(49-62) 103/59 mmHg (07/21 0510) SpO2:  [100 %] 100 % (07/21 0510) Weight:  [69.763 kg (153 lb 12.8 oz)] 69.763 kg (153 lb 12.8 oz) (07/21 0510)   General appearance: e-visit  Lab Results  Recent Labs  01/01/15 0453 01/02/15 0453  WBC 11.1* 14.5*  HGB 7.3* 7.4*  HCT 21.6* 22.2*  NA 139 136  K 3.0* 2.8*  CL 100* 100*  CO2 26 26  BUN 38* 49*  CREATININE 4.31* 5.48*   Liver Panel No results for input(s): PROT, ALBUMIN, AST, ALT, ALKPHOS, BILITOT, BILIDIR, IBILI in the last 72 hours. Sedimentation Rate No results for input(s): ESRSEDRATE in the last 72 hours. C-Reactive Protein No results for input(s): CRP in the last 72 hours.  Microbiology: Recent Results (from the past 240 hour(s))    Culture, blood (routine x 2)     Status: None   Collection Time: 12/27/14 11:28 AM  Result Value Ref Range Status   Specimen Description BLOOD  Final   Special Requests Immunocompromised  Final   Culture  Setup Time   Final    YEAST AEROBIC BOTTLE ONLY CRITICAL RESULT CALLED TO, READ BACK BY AND VERIFIED WITH: CATHY SUMMERLIN ON 12/29/14 AT 1000AM BY JEF    Culture CANDIDA PARAPSILOSIS AEROBIC BOTTLE ONLY   Final   Report Status 01/02/2015 FINAL  Final  Culture, blood (routine x 2)     Status: None   Collection Time: 12/27/14 11:36 AM  Result Value Ref Range Status   Specimen Description BLOOD  Final   Special Requests Immunocompromised  Final   Culture  Setup Time   Final    YEAST AEROBIC BOTTLE ONLY CRITICAL RESULT CALLED TO, READ BACK BY AND VERIFIED WITH: LISA ROMERO AT 0111 ON 12/29/14 [facility] CONFIRMED BY [place]    Culture CANDIDA PARAPSILOSIS AEROBIC BOTTLE ONLY   Final   Report Status 01/02/2015 FINAL  Final  Cath Tip Culture     Status: None   Collection Time: 12/27/14  4:17 PM  Result Value Ref Range Status   Specimen Description CATH TIP  Final   Special Requests NONE  Final   Culture NO GROWTH 2 DAYS  Final   Report Status 12/30/2014 FINAL  Final  Culture, blood (routine x 2)     Status: None (Preliminary result)   Collection Time: 12/29/14 12:28 PM  Result Value Ref Range Status   Specimen Description BLOOD RIGHT ARM  Final  Special Requests   Final    BOTTLES DRAWN AEROBIC AND ANAEROBIC  AER 4CC ANA 3CC   Culture  Setup Time   Final    BUDDING YEAST SEEN AEROBIC BOTTLE ONLY CRITICAL RESULT CALLED TO, READ BACK BY AND VERIFIED WITH: BROKE ROBERTSON AT 2230 01/01/15.TSH CONFIRMED BY [facility]    Culture   Final    BUDDING YEAST SEEN AEROBIC BOTTLE ONLY IDENTIFICATION TO FOLLOW    Report Status PENDING  Incomplete  Culture, blood (routine x 2)     Status: None (Preliminary result)   Collection Time: 12/29/14 12:28 PM  Result Value Ref Range Status   Specimen  Description BLOOD RIGHT FATTY CASTS  Final   Special Requests BOTTLES DRAWN AEROBIC AND ANAEROBIC  5CC  Final   Culture  Setup Time   Final    YEAST AEROBIC BOTTLE ONLY CRITICAL VALUE NOTED.  VALUE IS CONSISTENT WITH PREVIOUSLY REPORTED AND CALLED VALUE.    Culture YEAST IDENTIFICATION TO FOLLOW   Final   Report Status PENDING  Incomplete    Studies/Results: No results found.   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 Not surprising as port was still in place.  Has port been removed? Will repeat BCx Agree with holding permacath for now.  May need to consider removing temporary HD cather Would consider TEE          Bobby Rumpf Infectious Diseases (pager) [phone] [email or web address] 01/02/2015, 10:27 AM  LOS: 34 days

## 2015-01-02 NOTE — Progress Notes (Signed)
PT Cancellation Note  Patient Details Name: Caleb Francis MRN: [id number] DOB: 07-Nov-1945   Cancelled Treatment:    Reason Eval/Treat Not Completed: Patient declined, no reason specified. Pt recently returned from hemodialysis. Pt notes he is too fatigued to participate with PT. Attempt treatment tomorrow as schedule allows.    Erline Levine Bishop 01/02/2015, 3:04 PM

## 2015-01-02 NOTE — Op Note (Signed)
 VEIN AND VASCULAR SURGERY       Operative Note  Date: 01/02/2015  Preoperative diagnosis:  1. Fungemia with existing port  Postoperative diagnosis:  Same as above  Procedures: #1. Removal of right jugular port a cath   Surgeon: Leotis Pain, MD  Anesthesia: Local with moderate sedation.  Fluoroscopy time: none  Contrast used: 0  Estimated blood loss: Minimal  Indication for the procedure:  The patient is a 69 y.o. male who has fungemia and has an indwelling port previous placed for his malignancy. The port must be removed. Risks and benefits including need for potential replacement with recurrent disease were discussed and patient is agreeable to proceed.  Description of procedure: The patient was brought to the vascular and interventional radiology suite. The right neck chest and shoulder were sterilely prepped and draped, and a sterile surgical field was created. The area was then anesthetized with 1% lidocaine copiously. The previous incision was reopened and electrocautery used to dissected down to the port and the catheter. These were dissected free and the catheter was gently removed from the vein in its entirety. The port was dissected out from the fibrous connective tissue and the Prolene sutures were removed. The port was then removed in its entirety including the catheter. The wound was then closed with a 3-0 Vicryl and a 4-0 Monocryl and Dermabond was placed as a dressing. The patient was then taken to the recovery room in stable condition having tolerated the procedure well.  Complications: none  Condition: stable   [name], MD 01/02/2015

## 2015-01-02 NOTE — Progress Notes (Signed)
Pt dr. Lucky Cowboy, make patient NPO for port removal. Telephone order.

## 2015-01-02 NOTE — Progress Notes (Signed)
PT Cancellation Note  Patient Details Name: Caleb Francis MRN: [id number] DOB: September 25, 1945   Cancelled Treatment:    Reason Eval/Treat Not Completed: Patient at procedure or test/unavailable. Pt in hemodialysis.    Erline Levine Bishop 01/02/2015, 2:35 PM

## 2015-01-02 NOTE — Progress Notes (Signed)
Spoke with Dr Marcille Blanco regarding Geodon and patien'ts BP.  Pt not agitated at this time.  Told to hold and monitor pt for agitation.  Can give later if becomes agitated. Lynnda Shields, RN

## 2015-01-02 NOTE — Progress Notes (Signed)
Subjective:  Pt resting comfortably. Is interactive today. Due for dialysis today.  Remains oliguric.  Objective:  Vital signs in last 24 hours:  Temp:  [98.7 F (37.1 C)-99.2 F (37.3 C)] 99.2 F (37.3 C) (07/21 0510) Pulse Rate:  [100-105] 105 (07/21 0510) Resp:  [18-20] 20 (07/21 0510) BP: (93-111)/(49-62) 103/59 mmHg (07/21 0510) SpO2:  [100 %] 100 % (07/21 0510) Weight:  [69.763 kg (153 lb 12.8 oz)] 69.763 kg (153 lb 12.8 oz) (07/21 0510)  Weight change: 2.363 kg (5 lb 3.4 oz) Filed Weights   12/31/14 1401 01/01/15 0300 01/02/15 0510  Weight: 67.722 kg (149 lb 4.8 oz) 67.268 kg (148 lb 4.8 oz) 69.763 kg (153 lb 12.8 oz)    Intake/Output: I/O last 3 completed shifts: In: 2505.3 [P.O.:480; I.V.:2025.3] Out: 576 [Urine:575; Stool:1]   Intake/Output this shift:     Physical Exam: General: chronically ill appearing  Head: oral mucosa moist  Eyes: Anicteric  Neck: Supple, trachea midline  Lungs:  Clear to auscultation normal effort  Heart: S1S2 no rubs  Abdomen:  Soft, NTND, BS present  Extremities: Trace LE edema  Neurologic: Awake, alert, oriented to self and place, follows commands  Skin: No acute rashes  GU foley  Access: Right femoral dialysis catheter    Basic Metabolic Panel:  Recent Labs Lab 12/27/14 0255 12/28/14 0531 12/29/14 0430 12/30/14 0445 12/31/14 0455 01/01/15 0453 01/02/15 0453  NA 138 137 139 139 137 139 136  K 4.4 4.0 3.9 3.6 3.1* 3.0* 2.8*  CL 101 102 101 98* 96* 100* 100*  CO2 26 24 22 28 25 26 26   GLUCOSE 195* 290* 214* 92 101* 112* 98  BUN 44* 75* 96* 52* 66* 38* 49*  CREATININE 3.68* 5.33* 6.51* 4.49* 5.98* 4.31* 5.48*  CALCIUM 8.6* 7.7* 7.8* 7.5* 7.3* 7.4* 6.9*  MG 1.7 1.9 2.0  --   --   --   --   PHOS 1.9* 6.2* 7.6*  --   --   --   --     Liver Function Tests:  Recent Labs Lab 12/28/14 0531  ALBUMIN 1.9*   No results for input(s): LIPASE, AMYLASE in the last 168 hours. No results for input(s): AMMONIA in the  last 168 hours.  CBC:  Recent Labs Lab 12/27/14 0255  12/29/14 0430 12/30/14 0445 12/31/14 0455 01/01/15 0453 01/02/15 0453  WBC 12.4*  < > 14.1* 12.0* 11.1* 11.1* 14.5*  NEUTROABS 8.8*  --   --   --   --  7.9*  --   HGB 7.1*  < > 7.0* 7.8* 7.3* 7.3* 7.4*  HCT 22.0*  < > 21.4* 22.8* 21.8* 21.6* 22.2*  MCV 88.1  < > 87.5 84.4 83.4 84.1 84.4  PLT 158  < > 81* 69* 58* 56* 66*  < > = values in this interval not displayed.  Cardiac Enzymes: No results for input(s): CKTOTAL, CKMB, CKMBINDEX, TROPONINI in the last 168 hours.  BNP: Invalid input(s): POCBNP  CBG:  Recent Labs Lab 01/01/15 1505 01/01/15 1653 01/01/15 2006 01/02/15 0143 01/02/15 0735  GLUCAP 168* 149* 200* 91 97    Microbiology: Results for orders placed or performed during the hospital encounter of 11/29/14  MRSA PCR Screening     Status: None   Collection Time: 11/29/14 12:13 PM  Result Value Ref Range Status   MRSA by PCR NEGATIVE NEGATIVE Final    Comment:        The GeneXpert MRSA Assay (FDA approved for NASAL specimens  only), is one component of a comprehensive MRSA colonization surveillance program. It is not intended to diagnose MRSA infection nor to guide or monitor treatment for MRSA infections.   Culture, blood (routine x 2)     Status: None   Collection Time: 11/29/14  1:19 PM  Result Value Ref Range Status   Specimen Description BLOOD  Final   Special Requests Immunocompromised  Final   Culture NO GROWTH 5 DAYS  Final   Report Status 12/04/2014 FINAL  Final  Urine culture     Status: None   Collection Time: 11/30/14 10:12 AM  Result Value Ref Range Status   Specimen Description URINE, CATHETERIZED  Final   Special Requests Immunocompromised  Final   Culture NO GROWTH 2 DAYS  Final   Report Status 12/02/2014 FINAL  Final  Culture, blood (routine x 2)     Status: None   Collection Time: 12/06/14  8:08 PM  Result Value Ref Range Status   Specimen Description BLOOD  Final    Special Requests NONE  Final   Culture  Setup Time   Final    GRAM POSITIVE COCCI IN BOTH AEROBIC AND ANAEROBIC BOTTLES CRITICAL RESULT CALLED TO, READ BACK BY AND VERIFIED WITH: JENNIFER BEZARD AT 4765 12/08/14.PMH CONFIRMED BY [facility]    Culture   Final    STAPHYLOCOCCUS AURICULARIS IN BOTH AEROBIC AND ANAEROBIC BOTTLES    Report Status 12/11/2014 FINAL  Final   Organism ID, Bacteria STAPHYLOCOCCUS AURICULARIS  Final      Susceptibility   Staphylococcus auricularis - MIC*    CIPROFLOXACIN >=8 RESISTANT Resistant     ERYTHROMYCIN >=8 RESISTANT Resistant     GENTAMICIN <=0.5 SENSITIVE Sensitive     OXACILLIN >=4 RESISTANT Resistant     TETRACYCLINE <=1 SENSITIVE Sensitive     VANCOMYCIN 1 SENSITIVE Sensitive     CLINDAMYCIN <=0.25 SENSITIVE Sensitive     TRIMETH/SULFA Value in next row Sensitive      SENSITIVE<=20    LEVOFLOXACIN Value in next row Intermediate      INTERMEDIATE4    * STAPHYLOCOCCUS AURICULARIS  Culture, blood (routine x 2)     Status: None   Collection Time: 12/06/14  8:40 PM  Result Value Ref Range Status   Specimen Description BLOOD  Final   Special Requests NONE  Final   Culture NO GROWTH 5 DAYS  Final   Report Status 12/11/2014 FINAL  Final  Culture, blood (routine x 2)     Status: None   Collection Time: 12/08/14 11:40 AM  Result Value Ref Range Status   Specimen Description BLOOD  Final   Special Requests Normal  Final   Culture NO GROWTH 6 DAYS  Final   Report Status 12/14/2014 FINAL  Final  Culture, blood (routine x 2)     Status: None   Collection Time: 12/08/14 11:49 AM  Result Value Ref Range Status   Specimen Description BLOOD  Final   Special Requests Normal  Final   Culture NO GROWTH 6 DAYS  Final   Report Status 12/14/2014 FINAL  Final  C difficile quick scan w PCR reflex ([facility] only)     Status: None   Collection Time: 12/14/14 10:11 AM  Result Value Ref Range Status   C Diff antigen NEGATIVE  Final   C Diff toxin NEGATIVE  Final   C  Diff interpretation Negative for C. difficile  Final  Culture, blood (routine x 2)     Status: None  Collection Time: 12/27/14 11:28 AM  Result Value Ref Range Status   Specimen Description BLOOD  Final   Special Requests Immunocompromised  Final   Culture  Setup Time   Final    YEAST AEROBIC BOTTLE ONLY CRITICAL RESULT CALLED TO, READ BACK BY AND VERIFIED WITH: CATHY SUMMERLIN ON 12/29/14 AT 1000AM BY JEF    Culture CANDIDA PARAPSILOSIS AEROBIC BOTTLE ONLY   Final   Report Status 01/02/2015 FINAL  Final  Culture, blood (routine x 2)     Status: None   Collection Time: 12/27/14 11:36 AM  Result Value Ref Range Status   Specimen Description BLOOD  Final   Special Requests Immunocompromised  Final   Culture  Setup Time   Final    YEAST AEROBIC BOTTLE ONLY CRITICAL RESULT CALLED TO, READ BACK BY AND VERIFIED WITH: LISA ROMERO AT 0111 ON 12/29/14 [facility] CONFIRMED BY [place]    Culture CANDIDA PARAPSILOSIS AEROBIC BOTTLE ONLY   Final   Report Status 01/02/2015 FINAL  Final  Cath Tip Culture     Status: None   Collection Time: 12/27/14  4:17 PM  Result Value Ref Range Status   Specimen Description CATH TIP  Final   Special Requests NONE  Final   Culture NO GROWTH 2 DAYS  Final   Report Status 12/30/2014 FINAL  Final  Culture, blood (routine x 2)     Status: None (Preliminary result)   Collection Time: 12/29/14 12:28 PM  Result Value Ref Range Status   Specimen Description BLOOD RIGHT ARM  Final   Special Requests   Final    BOTTLES DRAWN AEROBIC AND ANAEROBIC  AER 4CC ANA 3CC   Culture  Setup Time   Final    BUDDING YEAST SEEN AEROBIC BOTTLE ONLY CRITICAL RESULT CALLED TO, READ BACK BY AND VERIFIED WITH: BROKE ROBERTSON AT 2230 01/01/15.TSH CONFIRMED BY SDR    Culture   Final    BUDDING YEAST SEEN AEROBIC BOTTLE ONLY IDENTIFICATION TO FOLLOW    Report Status PENDING  Incomplete  Culture, blood (routine x 2)     Status: None (Preliminary result)   Collection Time: 12/29/14  12:28 PM  Result Value Ref Range Status   Specimen Description BLOOD RIGHT FATTY CASTS  Final   Special Requests BOTTLES DRAWN AEROBIC AND ANAEROBIC  5CC  Final   Culture  Setup Time   Final    YEAST AEROBIC BOTTLE ONLY CRITICAL VALUE NOTED.  VALUE IS CONSISTENT WITH PREVIOUSLY REPORTED AND CALLED VALUE.    Culture YEAST IDENTIFICATION TO FOLLOW   Final   Report Status PENDING  Incomplete    Coagulation Studies: No results for input(s): LABPROT, INR in the last 72 hours.  Urinalysis: No results for input(s): COLORURINE, LABSPEC, PHURINE, GLUCOSEU, HGBUR, BILIRUBINUR, KETONESUR, PROTEINUR, UROBILINOGEN, NITRITE, LEUKOCYTESUR in the last 72 hours.  Invalid input(s): APPERANCEUR    Imaging: No results found.   Medications:   . amiodarone 30 mg/hr (01/02/15 0120)  . dextrose 5 % and 0.45% NaCl 40 mL/hr at 01/01/15 0936   . antiseptic oral rinse  7 mL Mouth Rinse BID  . carvedilol  20 mg Oral Daily  . dronabinol  2.5 mg Oral BID AC  . epoetin (EPOGEN/PROCRIT) injection  14,000 Units Subcutaneous Q T,Th,Sa-HD  . feeding supplement (ENSURE ENLIVE)  237 mL Oral BID BM  . feeding supplement (NEPRO CARB STEADY)  237 mL Oral BID BM  . insulin aspart  0-9 Units Subcutaneous Q6H  . micafungin (MYCAMINE) IV  100 mg Intravenous Daily  . mometasone-formoterol  2 puff Inhalation BID  . pantoprazole (PROTONIX) IV  40 mg Intravenous Q12H  . potassium chloride  10 mEq Intravenous Q1 Hr x 4  . scopolamine  1 patch Transdermal Q72H  . ziprasidone  10 mg Intramuscular QHS   acetaminophen **OR** acetaminophen, albuterol, haloperidol lactate, lidocaine (PF), lidocaine-prilocaine, magnesium hydroxide, metoprolol, morphine injection, ondansetron (ZOFRAN) IV, promethazine, sodium chloride, sodium chloride  Assessment/ Plan:  Mr. Spellman is a 67 black male with progressive mantle cell lymphoma, RICE chemo in 11/2014, hx left sided hydronephrosis and hydroureter. S/p ureteral stent placement    Hospital course complicated by sepsis with Klebsiella oxytoca, hypernatremia. Now with fungemia.   1.  Acute renal failure due to ATN: Pt has undergone multiple dialysis sessions.  Has temporary dialysis catheter in place which was replaced 7/17.  Can't place permcath now due to fungemia. - Pt due for HD today, orders prepared.  Most likely now has ESRD and will need continued HD.  As above hold off on permanent dialysis catheter placement.  2.  Sepsis with fungemia. I41.9, B49. Status post bacteremia with klebsiella oxytoca: completed two weeks for IV ceftazidime. Subsequently growing candida parapsilosis. - ID following, pt on micafungin, will need to monitor blood cultures as dialysis catheter remains in place.   3.  Anemia of chronic kidney disease/mantle cell lymphoma:  Continue epogen 14000 units IV with HD, use of epogen ok'd by hematology/onc.  LOS: [place] 7/21/20168:40 AM

## 2015-01-02 NOTE — Progress Notes (Addendum)
Nutrition Follow-up  DOCUMENTATION CODES:   Severe malnutrition in context of acute illness/injury  INTERVENTION:   Coordination of Care: pt continues with inadequate nutritional intake as per documentation from calorie count. Continue to recommend feeding tube placement if aggressive intervention is wanted. Discussed with MD Volanda Napoleon, also discussed with Care Management Team. Pt drinking some liquids but only bites of solid food. Calorie Count results since Monday (as pt NPO all weekend); 3 day average: 618 kcals, 23 grams of protein. If you only include the last 2 days (as po intake minimal on Monday), averaged 928 kcals, 34.5 grams of protein. Despite this improvement, po intake inadequate. Meets less than 50% of calorie and protein; pt with increased needs due to dialysis, wounds, active cancer; acutely needs increased due to fungal infection as well.     Pt has been diagnosed with severe malnutrition on this admission. Pt has a stage II pressure ulcer on buttock and as per Velna Hatchet RN looks worse today than it did no Monday. Pt very decompensated, bedbound essentially since admission. Per Care Management, pt unable to get out of bed with PT due to femoral HD cath per policy. Pt is receiving multiple supplements; magic cup and mighty shakes on meal trays; nepro and ensure between meals. Will discontinue Ensure; as pt not drinking these but is drinking the Nepro at times which contains more kcals/protein per volume than the Ensure (each supplement provides 425 kcal and 19 grams protein).Pt is also on Marinol. Will continue supplements, feeding assistance    NUTRITION DIAGNOSIS:   Inadequate oral intake related to inability to eat as evidenced by NPO status. Continues  GOAL:   Patient will meet greater than or equal to 90% of their needs  MONITOR:    (Energy intake, Anthropometrics, Electrolyte/renal profile, Glucose profile, Digestive System)   ASSESSMENT:    Calorie Count continues; pt  receiving intermittent HD. On micafungin for fungal infection  Diet Order:  DIET DYS 2 Room service appropriate?: Yes; Fluid consistency:: Thin   Energy Intake: this morning with assistance, pt took 100% of Orange Juice, 100% of Nepro and 1/2 banana. Calorie Count: documentation on 7/18 was bites of magic cup, sips of Ensure with bites of food at dinner; negligible intake: Intake negligable 7/19: pt did not eat breakfast due to dialysis; ate 2 bites each of chicken, potatoes and green beans. Drank 100% of juice, 75% of milk; no supplements. At dinner, pt again took 1-2 bites of beef, potatoes and green beans; drank 100% of milk, Did drink bedtime Nepro. Total: 750 kcals, 33 g of protein 7/20: Breakfast: 1 bite of egg, 100% of orange juice and mighty shake (360 kcals, 9 g of protein; Lunch: 1/4 mashed potatoes, 2 bites of chicken salad, 1 bite of bread, 3/4 Ensure ( 310, 15.5 g); Dinner: 100% of apple juice and mighty shake, 25% magic cup (435 kcals, 11 g of protein) Total: 1105 kcals, 35.5 g of protein Pt requires feeding assistance, needs encouragement  Skin:   (stage II on buttock, stage I on sacrum)-Per Brandi RN, pressure ulcers look worse than they did on Monday  Last BM:   +BM, of note, pt also with large amount of clear mucous via rectum  Electrolyte and Renal Profile:  Recent Labs Lab 12/27/14 0255 12/28/14 0531 12/29/14 0430  12/31/14 0455 01/01/15 0453 01/02/15 0453  BUN 44* 75* 96*  < > 66* 38* 49*  CREATININE 3.68* 5.33* 6.51*  < > 5.98* 4.31* 5.48*  NA 138 137 139  < >  137 139 136  K 4.4 4.0 3.9  < > 3.1* 3.0* 2.8*  MG 1.7 1.9 2.0  --   --   --   --   PHOS 1.9* 6.2* 7.6*  --   --   --   --   < > = values in this interval not displayed.  Glucose Profile:  Recent Labs  01/01/15 2006 01/02/15 0143 01/02/15 0735  GLUCAP 200* 91 97   Protein Profile:  Recent Labs Lab 12/28/14 0531  ALBUMIN 1.9*   Nutritional Anemia Profile:  CBC Latest Ref Rng 01/02/2015  01/01/2015 12/31/2014  WBC 3.8 - 10.6 K/uL 14.5(H) 11.1(H) 11.1(H)  Hemoglobin 13.0 - 18.0 g/dL 7.4(L) 7.3(L) 7.3(L)  Hematocrit 40.0 - 52.0 % 22.2(L) 21.6(L) 21.8(L)  Platelets 150 - 440 K/uL 66(L) 56(L) 58(L)    Meds: D5-1/2 NS at 40 ml/hr (163 kcals in 24 hours); marinol; ss novolog  Height:   Ht Readings from Last 1 Encounters:  11/29/14 6' (1.829 m)    Weight:   Wt Readings from Last 1 Encounters:  01/02/15 153 lb 12.8 oz (69.763 kg)   Filed Weights   12/31/14 1401 01/01/15 0300 01/02/15 0510  Weight: 149 lb 4.8 oz (67.722 kg) 148 lb 4.8 oz (67.268 kg) 153 lb 12.8 oz (69.763 kg)      Wt Readings from Last 10 Encounters:  01/02/15 153 lb 12.8 oz (69.763 kg)  11/27/14 150 lb 5.7 oz (68.2 kg)  11/25/14 156 lb (70.761 kg)  11/18/14 157 lb 11.2 oz (71.532 kg)  11/05/14 164 lb 0.4 oz (74.4 kg)  10/24/14 165 lb (74.844 kg)  10/24/14 169 lb 5 oz (76.8 kg)  10/22/14 168 lb (76.204 kg)    BMI:  Body mass index is 20.85 kg/(m^2).  Estimated Nutritional Needs:   Kcal:  2992-4268 kcals (BEE 1436, 1.2 AF, 1.2-1.4 IF) using current wt of 62.9 kg  Protein:  76-95 (1.2-1.5 g/kg)   Fluid:  1575-1890 mL (25-30 ml/kg)   EDUCATION NEEDS:   No education needs identified at this time  [place], RD, LDN [phone] Pager

## 2015-01-02 NOTE — Progress Notes (Signed)
Caleb Francis at [place] NAME: Caleb Francis    MR#:  [id number]  DATE OF BIRTH:  April 01, 1946  SUBJECTIVE:  CHIEF COMPLAINT:   Chief Complaint  Patient presents with  . Altered Mental Status   Seen  REVIEW OF SYSTEMS:   Review of Systems  Constitutional: Negative for fever.  Respiratory: Negative for shortness of breath.   Cardiovascular: Negative for chest pain and palpitations.  Gastrointestinal: Positive for diarrhea. Negative for nausea, vomiting and abdominal pain.  Genitourinary: Negative for dysuria.    DRUG ALLERGIES:  No Known Allergies  VITALS:  Blood pressure 106/58, pulse 96, temperature 97.5 F (36.4 C), temperature source Oral, resp. rate 18, height 6' (1.829 m), weight 72.5 kg (159 lb 13.3 oz), SpO2 98 %.  PHYSICAL EXAMINATION:  GENERAL:  69 y.o.-year-old patient lying in the bed with no acute distress.  EYES: Pupils equal, round, reactive to light and accommodation. No scleral icterus. Extraocular muscles intact.  HEENT: Head atraumatic, normocephalic. Oropharynx and nasopharynx clear.  NECK:  Supple, no jugular venous distention. No thyroid enlargement, no tenderness.  LUNGS: Normal breath sounds bilaterally, no wheezing, rales,rhonchi or crepitation. No use of accessory muscles of respiration. Good air movement CARDIOVASCULAR: S1, S2 normal. 2/6 systolic ejection murmur, no rubs, or gallops.  ABDOMEN: Soft, nontender, nondistended. Bowel sounds present. No organomegaly or mass.  EXTREMITIES: No pedal edema, cyanosis, or clubbing.  NEUROLOGIC: Cranial nerves II through XII are intact. Muscle strength 5/5 in all extremities. Sensation intact. Gait not checked.  PSYCHIATRIC: The patient is alert and calm  SKIN: No obvious rash, lesion, or ulcer.   There is a right femoral temp dialysis catheter in place  There continues to be a port in the right chest wall   LABORATORY PANEL:   CBC  Recent Labs Lab  01/02/15 0453  WBC 14.5*  HGB 7.4*  HCT 22.2*  PLT 66*   ------------------------------------------------------------------------------------------------------------------  Chemistries   Recent Labs Lab 12/29/14 0430  01/02/15 0453  NA 139  < > 136  K 3.9  < > 2.8*  CL 101  < > 100*  CO2 22  < > 26  GLUCOSE 214*  < > 98  BUN 96*  < > 49*  CREATININE 6.51*  < > 5.48*  CALCIUM 7.8*  < > 6.9*  MG 2.0  --   --   < > = values in this interval not displayed. ------------------------------------------------------------------------------------------------------------------  Cardiac Enzymes No results for input(s): TROPONINI in the last 168 hours. ------------------------------------------------------------------------------------------------------------------  RADIOLOGY:  No results found.  EKG:   Orders placed or performed during the hospital encounter of 11/29/14  . ED EKG  . ED EKG    ASSESSMENT AND PLAN:   #1 sepsis: - Fungemia with Candida parapsilosis blood cultures 7/15, and again on repeat culture 7/17. Appreciate infectious disease consultation. Continue micafungin.  - Consult to vascular surgery placed to remove Port-A-Cath, nursing spoke with Dr. Lucky Cowboy today. - Bacteremia with Klebsiella oxytoca status post 2 weeks of IV ceftaz  - Bacteremia with coag negative Staphylococcus, treated with vancomycin, echo negative - Hemodialysis catheter placed on the 17th, urethral catheter placed 26 days ago - Discussed femoral hemodialysis catheter with Dr. Holley Raring today. Once Port-A-Cath is removed and blood cultures are negative for 24-48 hours we can remove the femoral hemodialysis catheter and place an IJ for hemodialysis access. This would allow him to participate in physical therapy  #2 acute renal failure due to ATN -  Appreciate nephrology following, receiving hemodialysis - This is likely end stage renal disease, no improvement in renal function minimal urine output. He  will need long-term dialysis  #3 Mantle cell lymphoma - Appreciate oncology following - Currently on Neupogen, status post 2 units packed red blood cells on 6/25 and 1 unit on 7/2 - Will need premedication prior to any further transfusions including Benadryl and Tylenol - Subsequent pancytopenia  #4 chronic ileus with GI bleeding: Clinically improving. No further bleeding. NG tube and rectal tube discontinued 7/12 - Continue PPI, high risk for further bleeding due to thrombocytopenia - Last seen by GI on July 4. No further plans for endoscopy. - Has had several watery stools, abdomen still slightly distended, I will check a KUB today  #5 deconditioning: Unable to participate in physical therapy due to femoral hemodialysis catheter. Working on alternative placement for hemodialysis access that would allow him to participate in physical therapy and progress towards discharge.  #6 protein calorie malnutrition: Discussed at length with nutritionist today. He is taking in very little orally. I discussed this with the patient he states his appetite is depressed. He does not like the nutritional supplements provided. Discussed the possibility of PEG tube, he states he would not want a PEG tube placed. For now continue Marinol.  #7 encephalopathy: Appreciate neurology consultation. This is likely encephalopathy/delirium due to critical illness. Agree with when necessary Geodon for agitation.  CODE STATUS: DO NOT RESUSCITATE  TOTAL TIME TAKING CARE OF THIS PATIENT: 41 minutes.  Greater than 50% of time spent in care coordination and counseling.All the records are reviewed and case discussed with Care Management/Social Worker. I spoke with his son Caleb Francis on the phone today. Questions answered and update given. Plan is for family meeting tomorrow morning at 9 AM to include his daughter who is his medical power of attorney.  POSSIBLE D/C IN 3-4 DAYS, DEPENDING ON CLINICAL  CONDITION.   Myrtis Ser M.D on 01/02/2015 at 2:56 PM  Between 7am to 6pm - Pager - [phone]  After 6pm go to [email or web address] - password [email or web address] [facility]  [facility]  Office  [phone]  CC: Primary care physician; No PCP Per Patient

## 2015-01-02 NOTE — Plan of Care (Signed)
Problem: Phase III Progression Outcomes Goal: Other Phase III Outcomes/Goals Outcome: Not Progressing Pt is alert and oriented x 3, bed bound, poor mobility, poor appetite, ongoing calorie count, portacath removed throughuot shift, dialysis performed, son at bedside visiting, denies pain, 3 loose bm throughout shift, small urine output through foley catheter, refuses oral care with persistent attempts, IV fluids and amiadorone continue to infuse. Received 2 bags of IV potassium.

## 2015-01-02 NOTE — Progress Notes (Signed)
[facility] History & Physical Update  The patient was interviewed and re-examined.  The patient's previous History and Physical has been reviewed and is unchanged. He has fungemia, and his port needs to be removed.  Risks and benefits were discussed and he is agreeable to proceed.  Elliette Seabolt, MD  01/02/2015, 3:36 PM

## 2015-01-02 NOTE — OR Nursing (Signed)
Pt is diabetic, pt declined having FSBS done.

## 2015-01-03 ENCOUNTER — Other Ambulatory Visit: Payer: Self-pay | Admitting: General Practice

## 2015-01-03 ENCOUNTER — Encounter: Payer: Self-pay | Admitting: Vascular Surgery

## 2015-01-03 LAB — GLUCOSE, CAPILLARY
Glucose-Capillary: 97 mg/dL (ref 65–99)
Glucose-Capillary: 102 mg/dL — ABNORMAL HIGH (ref 65–99)
Glucose-Capillary: 169 mg/dL — ABNORMAL HIGH (ref 65–99)
Glucose-Capillary: 128 mg/dL — ABNORMAL HIGH (ref 65–99)

## 2015-01-03 LAB — CBC
HCT: 21.2 % — ABNORMAL LOW (ref 40.0–52.0)
Hemoglobin: 7 g/dL — ABNORMAL LOW (ref 13.0–18.0)
MCH: 28.1 pg (ref 26.0–34.0)
MCHC: 33 g/dL (ref 32.0–36.0)
MCV: 85.1 fL (ref 80.0–100.0)
Platelets: 88 10*3/uL — ABNORMAL LOW (ref 150–440)
RBC: 2.5 MIL/uL — AB (ref 4.40–5.90)
RDW: 18.4 % — ABNORMAL HIGH (ref 11.5–14.5)
WBC: 16.2 10*3/uL — AB (ref 3.8–10.6)

## 2015-01-03 LAB — BASIC METABOLIC PANEL
ANION GAP: 12 (ref 5–15)
BUN: 35 mg/dL — ABNORMAL HIGH (ref 6–20)
CHLORIDE: 100 mmol/L — AB (ref 101–111)
CO2: 28 mmol/L (ref 22–32)
Calcium: 7.4 mg/dL — ABNORMAL LOW (ref 8.9–10.3)
Creatinine, Ser: 4.75 mg/dL — ABNORMAL HIGH (ref 0.61–1.24)
GFR calc Af Amer: 13 mL/min — ABNORMAL LOW (ref >60)
GFR calc non Af Amer: 12 mL/min — ABNORMAL LOW (ref >60)
Glucose, Bld: 105 mg/dL — ABNORMAL HIGH (ref 65–99)
Potassium: 2.9 mmol/L — CL (ref 3.5–5.1)
SODIUM: 140 mmol/L (ref 135–145)

## 2015-01-03 LAB — CULTURE, BLOOD (ROUTINE X 2)

## 2015-01-03 LAB — PARATHYROID HORMONE, INTACT (NO CA): PTH: 126 pg/mL — ABNORMAL HIGH (ref 15–65)

## 2015-01-03 MED ORDER — HEPARIN SODIUM (PORCINE) 5000 UNIT/ML IJ SOLN
5000.0000 [IU] | Freq: Three times a day (TID) | INTRAMUSCULAR | Status: DC
  Administered 2015-01-03 – 2015-01-04 (×3): 5000 [IU] via SUBCUTANEOUS
  Filled 2015-01-03 (×3): qty 1

## 2015-01-03 MED ORDER — POTASSIUM CHLORIDE 20 MEQ/15ML (10%) PO SOLN
20.0000 meq | Freq: Once | ORAL | Status: AC
  Administered 2015-01-03: 20 meq via ORAL
  Filled 2015-01-03: qty 15

## 2015-01-03 MED ORDER — ZIPRASIDONE MESYLATE 20 MG IM SOLR
10.0000 mg | Freq: Every evening | INTRAMUSCULAR | Status: DC | PRN
  Filled 2015-01-03: qty 20

## 2015-01-03 MED ORDER — AMIODARONE HCL 200 MG PO TABS
200.0000 mg | ORAL_TABLET | Freq: Every day | ORAL | Status: DC
  Administered 2015-01-03 – 2015-01-23 (×17): 200 mg via ORAL
  Filled 2015-01-03 (×18): qty 1

## 2015-01-03 NOTE — Progress Notes (Addendum)
Brief Nutrition Note:   Spoke with MD Talitha Givens SLP regarding nutritional poc. Family does not want to pursue feeding tube placement at this time per MD. Per MD, family believes that pt is not eating because he is not receiving the foods he likes. Discussed upgrading diet to Regular with MD and SLP, MD in agreement. Met with pt, with family present, and provided regular menu and phone number to diet office for ordering meals (phone number also on white board in patient room). Pt could not specifically name any food that he would like to eat; lunch order was obtained from son and daughter. Encouraged family to bring in outside food for pt, pt allowed anything he wants per MD Volanda Napoleon. Also reviewed current supplements ordered with patient and family; pt states the supplements are "ok" but he is agreeable to continue them at present; explained the importance of the nutritional supplements for maximizing nutritional intake. MD inquiring about feeding assistance at meal times; order is already in place for feeding assistance; pt assisted at meal times either by nursing staff or family members. Continues on Marinol. Noted results of repeat abdominal xray with plan to re-consult GI for recommendations per MD; pt does not report any nausea, abdominal pain at this time. No vomitting reported. Pt +BM. Continue to assess  HIGH Care Level  Kerman Passey MS, RD, LDN [phone] Pager

## 2015-01-03 NOTE — Clinical Social Work Note (Signed)
CSW met with pt's family, attending MD, RN director, and RNCM.  Pt is not currently SNF appropriate.  His health needs to improve drastically before SNF can be an option.  Current goals are working to get pt to eat and working to improve his mobility.  CSW will continue to follow case.

## 2015-01-03 NOTE — Progress Notes (Signed)
[facility] INFECTIOUS DISEASE PROGRESS NOTE Date of Admission:  11/29/2014     ID: Caleb Francis is a 69 y.o. male with  Candida parapsolisis bloodstream infected.  Principal Problem:   Fungemia Active Problems:   Mantle cell lymphoma   Sepsis   Thrombocytopenia   Absolute anemia   Rheumatoid arthritis with rheumatoid factor   Altered mental status   Chest pain   Metabolic encephalopathy   Ileus   Fecal impaction   Protein-calorie malnutrition, severe   Ogilvie's syndrome   Pseudo-obstruction of colon   Pressure ulcer   Acute renal insufficiency  Subjective: Portacath removed 7/21.  Low grade fevers persist   ROS  Eleven systems are reviewed and negative except per hpi  Medications:  Antibiotics Given (last 72 hours)    Date/Time Action Medication Dose Rate   01/02/15 1551 Given   ceFAZolin (ANCEF) IVPB 1 g/50 mL premix 1 g 100 mL/hr     . antiseptic oral rinse  7 mL Mouth Rinse BID  . carvedilol  20 mg Oral Daily  . Chlorhexidine Gluconate Cloth  6 each Topical Once  . dronabinol  2.5 mg Oral BID AC  . epoetin (EPOGEN/PROCRIT) injection  14,000 Units Subcutaneous Q T,Th,Sa-HD  . feeding supplement (ENSURE ENLIVE)  237 mL Oral BID BM  . feeding supplement (NEPRO CARB STEADY)  237 mL Oral BID BM  . insulin aspart  0-9 Units Subcutaneous Q6H  . micafungin (MYCAMINE) IV  100 mg Intravenous Daily  . mometasone-formoterol  2 puff Inhalation BID  . pantoprazole (PROTONIX) IV  40 mg Intravenous Q12H  . scopolamine  1 patch Transdermal Q72H  . ziprasidone  10 mg Intramuscular QHS    Objective: Vital signs in last 24 hours: Temp:  [97.5 F (36.4 C)-100.4 F (38 C)] 100.1 F (37.8 C) (07/22 0651) Pulse Rate:  [94-104] 95 (07/22 0651) Resp:  [18-39] 20 (07/22 0651) BP: (91-109)/(44-65) 96/46 mmHg (07/22 0651) SpO2:  [94 %-100 %] 100 % (07/22 0651) Weight:  [72.5 kg (159 lb 13.3 oz)-74.3 kg (163 lb 12.8 oz)] 74.3 kg (163 lb 12.8 oz) (07/22 6295) Physical  Exam  Constitutional: He is awake and interactive, Chronically ill appearing.  HENT: Mouth/Throat: Oropharynx is clear and moist. No oropharyngeal exudate.  Cardiovascular: Normal rate, regular rhythm and normal heart sounds. Exam reveals no gallop and no friction rub.  Pulmonary/Chest: Effort normal and breath sounds normal. No respiratory distress. He has no wheezes.  Abdominal: Soft. Bowel sounds are normal. He exhibits no distension. There is no tenderness.  Lymphadenopathy: He has no cervical adenopathy.  Neurological: He is alert and interactive Skin: Skin is warm and dry. No rash noted. No erythema.  Psychiatric: He has a normal mood and affect. His behavior is normal.  Access - R HD cath in position, prior portacath site wnl   Lab Results  Recent Labs  01/02/15 0453 01/03/15 0444  WBC 14.5* 16.2*  HGB 7.4* 7.0*  HCT 22.2* 21.2*  NA 136 140  K 2.8* 2.9*  CL 100* 100*  CO2 26 28  BUN 49* 35*  CREATININE 5.48* 4.75*    Microbiology: Results for orders placed or performed during the hospital encounter of 11/29/14  MRSA PCR Screening     Status: None   Collection Time: 11/29/14 12:13 PM  Result Value Ref Range Status   MRSA by PCR NEGATIVE NEGATIVE Final    Comment:        The GeneXpert MRSA Assay (FDA approved for NASAL  specimens only), is one component of a comprehensive MRSA colonization surveillance program. It is not intended to diagnose MRSA infection nor to guide or monitor treatment for MRSA infections.   Culture, blood (routine x 2)     Status: None   Collection Time: 11/29/14  1:19 PM  Result Value Ref Range Status   Specimen Description BLOOD  Final   Special Requests Immunocompromised  Final   Culture NO GROWTH 5 DAYS  Final   Report Status 12/04/2014 FINAL  Final  Urine culture     Status: None   Collection Time: 11/30/14 10:12 AM  Result Value Ref Range Status   Specimen Description URINE, CATHETERIZED  Final   Special Requests  Immunocompromised  Final   Culture NO GROWTH 2 DAYS  Final   Report Status 12/02/2014 FINAL  Final  Culture, blood (routine x 2)     Status: None   Collection Time: 12/06/14  8:08 PM  Result Value Ref Range Status   Specimen Description BLOOD  Final   Special Requests NONE  Final   Culture  Setup Time   Final    GRAM POSITIVE COCCI IN BOTH AEROBIC AND ANAEROBIC BOTTLES CRITICAL RESULT CALLED TO, READ BACK BY AND VERIFIED WITH: JENNIFER BEZARD AT 1607 12/08/14.PMH CONFIRMED BY [facility]    Culture   Final    STAPHYLOCOCCUS AURICULARIS IN BOTH AEROBIC AND ANAEROBIC BOTTLES    Report Status 12/11/2014 FINAL  Final   Organism ID, Bacteria STAPHYLOCOCCUS AURICULARIS  Final      Susceptibility   Staphylococcus auricularis - MIC*    CIPROFLOXACIN >=8 RESISTANT Resistant     ERYTHROMYCIN >=8 RESISTANT Resistant     GENTAMICIN <=0.5 SENSITIVE Sensitive     OXACILLIN >=4 RESISTANT Resistant     TETRACYCLINE <=1 SENSITIVE Sensitive     VANCOMYCIN 1 SENSITIVE Sensitive     CLINDAMYCIN <=0.25 SENSITIVE Sensitive     TRIMETH/SULFA Value in next row Sensitive      SENSITIVE<=20    LEVOFLOXACIN Value in next row Intermediate      INTERMEDIATE4    * STAPHYLOCOCCUS AURICULARIS  Culture, blood (routine x 2)     Status: None   Collection Time: 12/06/14  8:40 PM  Result Value Ref Range Status   Specimen Description BLOOD  Final   Special Requests NONE  Final   Culture NO GROWTH 5 DAYS  Final   Report Status 12/11/2014 FINAL  Final  Culture, blood (routine x 2)     Status: None   Collection Time: 12/08/14 11:40 AM  Result Value Ref Range Status   Specimen Description BLOOD  Final   Special Requests Normal  Final   Culture NO GROWTH 6 DAYS  Final   Report Status 12/14/2014 FINAL  Final  Culture, blood (routine x 2)     Status: None   Collection Time: 12/08/14 11:49 AM  Result Value Ref Range Status   Specimen Description BLOOD  Final   Special Requests Normal  Final   Culture NO GROWTH 6 DAYS   Final   Report Status 12/14/2014 FINAL  Final  C difficile quick scan w PCR reflex ([facility] only)     Status: None   Collection Time: 12/14/14 10:11 AM  Result Value Ref Range Status   C Diff antigen NEGATIVE  Final   C Diff toxin NEGATIVE  Final   C Diff interpretation Negative for C. difficile  Final  Culture, blood (routine x 2)     Status: None  Collection Time: 12/27/14 11:28 AM  Result Value Ref Range Status   Specimen Description BLOOD  Final   Special Requests Immunocompromised  Final   Culture  Setup Time   Final    YEAST AEROBIC BOTTLE ONLY CRITICAL RESULT CALLED TO, READ BACK BY AND VERIFIED WITH: CATHY SUMMERLIN ON 12/29/14 AT 1000AM BY JEF    Culture CANDIDA PARAPSILOSIS AEROBIC BOTTLE ONLY   Final   Report Status 01/02/2015 FINAL  Final  Culture, blood (routine x 2)     Status: None   Collection Time: 12/27/14 11:36 AM  Result Value Ref Range Status   Specimen Description BLOOD  Final   Special Requests Immunocompromised  Final   Culture  Setup Time   Final    YEAST AEROBIC BOTTLE ONLY CRITICAL RESULT CALLED TO, READ BACK BY AND VERIFIED WITH: LISA ROMERO AT 0111 ON 12/29/14 [facility] CONFIRMED BY [place]    Culture CANDIDA PARAPSILOSIS AEROBIC BOTTLE ONLY   Final   Report Status 01/02/2015 FINAL  Final  Cath Tip Culture     Status: None   Collection Time: 12/27/14  4:17 PM  Result Value Ref Range Status   Specimen Description CATH TIP  Final   Special Requests NONE  Final   Culture NO GROWTH 2 DAYS  Final   Report Status 12/30/2014 FINAL  Final  Culture, blood (routine x 2)     Status: None (Preliminary result)   Collection Time: 12/29/14 12:28 PM  Result Value Ref Range Status   Specimen Description BLOOD RIGHT ARM  Final   Special Requests   Final    BOTTLES DRAWN AEROBIC AND ANAEROBIC  AER 4CC ANA 3CC   Culture  Setup Time   Final    BUDDING YEAST SEEN AEROBIC BOTTLE ONLY CRITICAL RESULT CALLED TO, READ BACK BY AND VERIFIED WITH: BROKE ROBERTSON AT 2230  01/01/15.TSH CONFIRMED BY SDR    Culture   Final    BUDDING YEAST SEEN AEROBIC BOTTLE ONLY IDENTIFICATION TO FOLLOW    Report Status PENDING  Incomplete  Culture, blood (routine x 2)     Status: None (Preliminary result)   Collection Time: 12/29/14 12:28 PM  Result Value Ref Range Status   Specimen Description BLOOD RIGHT FATTY CASTS  Final   Special Requests BOTTLES DRAWN AEROBIC AND ANAEROBIC  5CC  Final   Culture  Setup Time   Final    YEAST AEROBIC BOTTLE ONLY CRITICAL VALUE NOTED.  VALUE IS CONSISTENT WITH PREVIOUSLY REPORTED AND CALLED VALUE.    Culture   Final    YEAST IDENTIFICATION TO FOLLOW AEROBIC BOTTLE ONLY    Report Status PENDING  Incomplete  Cath Tip Culture     Status: None (Preliminary result)   Collection Time: 01/02/15 12:42 PM  Result Value Ref Range Status   Specimen Description CATH TIP  Final   Special Requests Normal  Final   Culture NO GROWTH < 24 HOURS  Final   Report Status PENDING  Incomplete    Studies/Results: Dg Abd 1 View  01/02/2015   CLINICAL DATA:  Abdominal distension, patient has been hospitalized for 4 days; history of chronic renal insufficiency and lymphoma and ileus.  EXAM: ABDOMEN - 1 VIEW  COMPARISON:  Abdominal series of December 23, 2014  FINDINGS: There are loops of mildly distended gas-filled small bowel predominantly in the right mid abdomen. There are loops of mildly distended gas-filled colon in the left mid abdomen. There is a femoral venous catheter in place with the tip  lying at approximately the L2 level. There is a double pigtail stent on the left. There are degenerative changes of the lumbar spine.  IMPRESSION: Bowel gas pattern suggests a generalized ileus though a partial mid colonic obstruction is not absolutely excluded. Abdominal and pelvic CT scanning is recommended if the patient's distention is worsening.   Electronically Signed   By: Satara Virella  [place] M.D.   On: 01/02/2015 17:03    Assessment/Plan: 1. 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 Port removed 7/21 3  Lymphoma Day 36 post CTX 4. Ureteral Stent 5. ESRD R groin HD line placed 7-17  Total days of antibiotics: 6 micafungin  Recommendations Continue micafungin but if remains neg cx 7/21 and no evidence metastatic infection or endocarditis can transition to oral fluconazole for a total 14 days of therapy since first negative blood culture Agree with holding permacath for now but if bcx 7/21 + or recurrent candidemia will need to consider removing temporary HD catheer Would consider TEE if FU BCX persistently + Thank you very much for the consult. Will follow with you.  [place], [place]   01/03/2015, 8:10 AM

## 2015-01-03 NOTE — Consult Note (Signed)
WOC wound consult note Reason for Consult:Stage II sacral ulcer, present on admission. Wound type:Stage II pressure ulcer Pressure Ulcer POA: Yes Measurement:1 cm x 1 cm x 0.1 cm Wound bed:100% pink and moist Drainage (amount, consistency, odor) Minimal serosanguinous drainage Periwound:Intact Dressing procedure/placement/frequency:Cleanse sacral ulcer with soap and water.  Apply silicone border foam dressing per protocol.  Change every 3 days and PRN soilage.  Will not follow at this time.  Please re-consult if needed.  Domenic Moras RN BSN [place] Pager [phone]

## 2015-01-03 NOTE — Progress Notes (Signed)
Lab called, states pt's K+ 2.9.  Dr Reece Levy notified.  K+ improved from yesterday.  No orders at this time.  Deferred to rounding physician on day shift.    Lynnda Shields, RN

## 2015-01-03 NOTE — Progress Notes (Signed)
Speech Therapy Note: MD and Dietician consulted SLP re: the option of offering pt a regular diet consistency d/t the fact that he is not eating but bites of food; he prefers to drink liquids. MD made aware that pt did not orally attend well to the bolus trial of the grilled cheese sandwich offered to him - he only accepted 1 bolus trial of the sandwich. This increases pt's risk for aspiration/choking of food not well masticated. MD agreed and suggested to inform family as they are requesting an upgrade of diet to regular in order for pt to "eat better". Family was in pt's room, and pt/family educated on the need to check for full mastication and oral clearing/swallowing of all boluses of foods/liquids during meals of regular foods. Pt/family agreed. Diet upgraded to regular; Dietician offered menu and discussed options. ST will f/u w/ further education as nec. next 2-3 days.

## 2015-01-03 NOTE — Care Management (Addendum)
Attending, unit director, CM, CSW son and daughter met.  Patient will remain DNR.    It was discussed that if patient's nutritional status does not improve then he will require hospice- that nothing is going to improve as long as patient is not taking adequate nutrition.  Caleb Francis and [place] are against peg as this is not what patient would want.  Patient declined peg this morning .  He is very alert and oriented this day.  It was discussed that most likely the renal failure is ESRD and patient most likely will require chronic HD.  Discussed the need for patient to be able to sit in a chair for dialysis.  When blood cultures return as negative, then will proceed move the temporary dialysis catheter out of the groin to allow for activity.

## 2015-01-03 NOTE — Progress Notes (Signed)
Took over care at 15:30, pt is resting in bed quietly, denies pain, no significant events.

## 2015-01-03 NOTE — Progress Notes (Signed)
[facility] NAME: Caleb Francis    MR#:  [id number]  DATE OF BIRTH:  [birth year]  SUBJECTIVE:  CHIEF COMPLAINT:   Chief Complaint  Patient presents with  . Altered Mental Status   No complaints. No chest pain shortness of breath. He is alert and oriented.  REVIEW OF SYSTEMS:   Review of Systems  Constitutional: Negative for fever.  Respiratory: Negative for shortness of breath.   Cardiovascular: Negative for chest pain and palpitations.  Gastrointestinal: Positive for diarrhea. Negative for nausea, vomiting and abdominal pain.  Genitourinary: Negative for dysuria.    DRUG ALLERGIES:  No Known Allergies  VITALS:  Blood pressure 90/55, pulse 91, temperature 98.8 F (37.1 C), temperature source Oral, resp. rate 18, height 6' (1.829 m), weight 74.3 kg (163 lb 12.8 oz), SpO2 100 %.  PHYSICAL EXAMINATION:  GENERAL:  69 y.o.-year-old patient lying in the bed with no acute distress.  EYES: Pupils equal, round, reactive to light and accommodation. No scleral icterus. Extraocular muscles intact.  HEENT: Head atraumatic, normocephalic. Oropharynx and nasopharynx clear.  NECK:  Supple, no jugular venous distention. No thyroid enlargement, no tenderness.  LUNGS: Normal breath sounds bilaterally, no wheezing, rales,rhonchi or crepitation. No use of accessory muscles of respiration. Good air movement CARDIOVASCULAR: S1, S2 normal. 2/6 systolic ejection murmur, no rubs, or gallops.  ABDOMEN: Soft, nontender, nondistended. Bowel sounds present. No organomegaly or mass.  EXTREMITIES: No pedal edema, cyanosis, or clubbing.  NEUROLOGIC: Cranial nerves II through XII are intact. Muscle strength 5/5 in all extremities. Sensation intact. Gait not checked.  PSYCHIATRIC: The patient is alert and calm  SKIN: No obvious rash, lesion, or ulcer.   There is a right femoral temp dialysis catheter in place  LABORATORY PANEL:    CBC  Recent Labs Lab 01/03/15 0444  WBC 16.2*  HGB 7.0*  HCT 21.2*  PLT 88*   ------------------------------------------------------------------------------------------------------------------  Chemistries   Recent Labs Lab 12/29/14 0430  01/03/15 0444  NA 139  < > 140  K 3.9  < > 2.9*  CL 101  < > 100*  CO2 22  < > 28  GLUCOSE 214*  < > 105*  BUN 96*  < > 35*  CREATININE 6.51*  < > 4.75*  CALCIUM 7.8*  < > 7.4*  MG 2.0  --   --   < > = values in this interval not displayed. ------------------------------------------------------------------------------------------------------------------  Cardiac Enzymes No results for input(s): TROPONINI in the last 168 hours. ------------------------------------------------------------------------------------------------------------------  RADIOLOGY:  Dg Abd 1 View  01/02/2015   CLINICAL DATA:  Abdominal distension, patient has been hospitalized for 4 days; history of chronic renal insufficiency and lymphoma and ileus.  EXAM: ABDOMEN - 1 VIEW  COMPARISON:  Abdominal series of December 23, 2014  FINDINGS: There are loops of mildly distended gas-filled small bowel predominantly in the right mid abdomen. There are loops of mildly distended gas-filled colon in the left mid abdomen. There is a femoral venous catheter in place with the tip lying at approximately the L2 level. There is a double pigtail stent on the left. There are degenerative changes of the lumbar spine.  IMPRESSION: Bowel gas pattern suggests a generalized ileus though a partial mid colonic obstruction is not absolutely excluded. Abdominal and pelvic CT scanning is recommended if the patient's distention is worsening.   Electronically Signed   By: David  Martinique M.D.   On: 01/02/2015 17:03    EKG:  Orders placed or performed during the hospital encounter of 11/29/14  . ED EKG  . ED EKG    ASSESSMENT AND PLAN:   #1 sepsis: - Fungemia with Candida parapsilosis blood  cultures 7/15, and again on repeat culture 7/17. Appreciate infectious disease consultation. Continue micafungin.  - Port-A-Cath removed on 7/21 by Dr. dew. - Bacteremia with Klebsiella oxytoca status post 2 weeks of IV ceftaz resolved - Bacteremia with coag negative Staphylococcus, treated with vancomycin, echo negative resolved - Hemodialysis catheter placed on the 17th, urethral catheter placed 26 days ago - If blood cultures from 7/21 remain negative we will remove hemodialysis catheter in the right groin and replace with PermCath in the chest for dialysis access.  #2 acute renal failure due to ATN - Appreciate nephrology following, receiving hemodialysis - This is likely end stage renal disease, no improvement in renal function minimal urine output. He will need long-term dialysis  #3 Mantle cell lymphoma - Appreciate oncology following - Currently on Neupogen, status post 2 units packed red blood cells on 6/25 and 1 unit on 7/2 - Will need premedication prior to any further transfusions including Benadryl and Tylenol - Subsequent pancytopenia - Prognosis is poor. The plan had been to continue chemotherapy with hopes of eventual stem cell transplant. At this point he is not eligible for continued treatment. Dr. Mike Gip feels that his Mantle cell lymphoma has been very aggressive and will likely recur.  #4 chronic ileus with GI bleeding: Clinically improving. No further bleeding. NG tube and rectal tube discontinued 7/12 - Continue PPI, high risk for further bleeding due to thrombocytopenia - Last seen by GI on July 4. No further plans for endoscopy. - Persistent ileus seen on KUB 7/21, is passing watery stools. We'll reconsult gastroenterology as the ileus may be contributing to his lack of appetite and low oral intake. He may need an NG tube in place. Not at this point be a surgical candidate.  #5 deconditioning: Unable to participate in physical therapy due to femoral hemodialysis  catheter. Working on alternative placement for hemodialysis access that would allow him to participate in physical therapy and progress towards discharge.  #6 protein calorie malnutrition: Continue Marinol. Encourage intake. Discussed with family and the patient they would not be interested in PEG tube placement. We'll discuss ileus with gastroenterology.  #7 encephalopathy: Appreciate neurology consultation. This is likely encephalopathy/delirium due to critical illness. Agree with when necessary Geodon for agitation. Has not been agitated  #8 pressure ulcers: Wound care consultation.  CODE STATUS: DO NOT RESUSCITATE  TOTAL TIME TAKING CARE OF THIS PATIENT: 60 minutes Greater than 50% of time spent in care coordination and counseling. Family meeting today with the patient's son and daughter as well as social work, Designer, jewellery and case Mudlogger. I have also discussed this case with nephrology, oncology, gastroenterology, infectious disease.  Disposition: At this point the patient is not eating and family would not like a PEG tube placed. If his oral intake does not improve he would be a reasonable candidate for hospice home. If intake does improve and he can make progress with physical therapy would be a reasonable LTAC candidate.  Myrtis Ser M.D on 01/03/2015 at 4:04 PM  Between 7am to 6pm - Pager - [phone]  After 6pm go to [email or web address] - password [email or web address]  [facility]  Office  [phone]  CC: Primary care physician; No PCP Per Patient

## 2015-01-03 NOTE — Progress Notes (Signed)
Subjective:  Family at bedside today. Had HD yesterday. Mental status appears to better than his usual baseline today.  Objective:  Vital signs in last 24 hours:  Temp:  [97.5 F (36.4 C)-100.4 F (38 C)] 98.8 F (37.1 C) (07/22 1145) Pulse Rate:  [91-96] 91 (07/22 1145) Resp:  [17-20] 18 (07/22 1145) BP: (90-106)/(44-58) 90/55 mmHg (07/22 1145) SpO2:  [94 %-100 %] 100 % (07/22 1145) Weight:  [74.3 kg (163 lb 12.8 oz)] 74.3 kg (163 lb 12.8 oz) (07/22 0651)  Weight change: 2.737 kg (6 lb 0.5 oz) Filed Weights   01/02/15 0510 01/02/15 1200 01/03/15 0651  Weight: 69.763 kg (153 lb 12.8 oz) 72.5 kg (159 lb 13.3 oz) 74.3 kg (163 lb 12.8 oz)    Intake/Output: I/O last 3 completed shifts: In: 247 [P.O.:247] Out: 676 [Urine:675; Stool:1]   Intake/Output this shift:  Total I/O In: 211 [I.V.:211] Out: -   Physical Exam: General: chronically ill appearing  Head: oral mucosa moist  Eyes: Anicteric  Neck: Supple, trachea midline  Lungs:  Clear to auscultation normal effort  Heart: S1S2 no rubs  Abdomen:  Soft, NTND, BS present  Extremities: Trace LE edema  Neurologic: Awake, alert, oriented to self and place, follows commands  Skin: No acute rashes  GU foley  Access: Right femoral dialysis catheter    Basic Metabolic Panel:  Recent Labs Lab 12/28/14 0531 12/29/14 0430 12/30/14 0445 12/31/14 0455 01/01/15 0453 01/02/15 0453 01/02/15 1303 01/03/15 0444  NA 137 139 139 137 139 136  --  140  K 4.0 3.9 3.6 3.1* 3.0* 2.8*  --  2.9*  CL 102 101 98* 96* 100* 100*  --  100*  CO2 24 22 28 25 26 26   --  28  GLUCOSE 290* 214* 92 101* 112* 98  --  105*  BUN 75* 96* 52* 66* 38* 49*  --  35*  CREATININE 5.33* 6.51* 4.49* 5.98* 4.31* 5.48*  --  4.75*  CALCIUM 7.7* 7.8* 7.5* 7.3* 7.4* 6.9*  --  7.4*  MG 1.9 2.0  --   --   --   --   --   --   PHOS 6.2* 7.6*  --   --   --   --  2.3*  --     Liver Function Tests:  Recent Labs Lab 12/28/14 0531  ALBUMIN 1.9*   No  results for input(s): LIPASE, AMYLASE in the last 168 hours. No results for input(s): AMMONIA in the last 168 hours.  CBC:  Recent Labs Lab 12/30/14 0445 12/31/14 0455 01/01/15 0453 01/02/15 0453 01/03/15 0444  WBC 12.0* 11.1* 11.1* 14.5* 16.2*  NEUTROABS  --   --  7.9*  --   --   HGB 7.8* 7.3* 7.3* 7.4* 7.0*  HCT 22.8* 21.8* 21.6* 22.2* 21.2*  MCV 84.4 83.4 84.1 84.4 85.1  PLT 69* 58* 56* 66* 88*    Cardiac Enzymes: No results for input(s): CKTOTAL, CKMB, CKMBINDEX, TROPONINI in the last 168 hours.  BNP: Invalid input(s): POCBNP  CBG:  Recent Labs Lab 01/02/15 1754 01/02/15 2017 01/03/15 0232 01/03/15 0720 01/03/15 1356  GLUCAP 132* 194* 97 102* 169*    Microbiology: Results for orders placed or performed during the hospital encounter of 11/29/14  MRSA PCR Screening     Status: None   Collection Time: 11/29/14 12:13 PM  Result Value Ref Range Status   MRSA by PCR NEGATIVE NEGATIVE Final    Comment:  The GeneXpert MRSA Assay (FDA approved for NASAL specimens only), is one component of a comprehensive MRSA colonization surveillance program. It is not intended to diagnose MRSA infection nor to guide or monitor treatment for MRSA infections.   Culture, blood (routine x 2)     Status: None   Collection Time: 11/29/14  1:19 PM  Result Value Ref Range Status   Specimen Description BLOOD  Final   Special Requests Immunocompromised  Final   Culture NO GROWTH 5 DAYS  Final   Report Status 12/04/2014 FINAL  Final  Culture, blood (routine x 2)     Status: None   Collection Time: 11/29/14  3:04 PM  Result Value Ref Range Status   Specimen Description Blood  Final   Special Requests Immunocompromised  Final   Report Status 01/03/2015 FINAL  Final  Urine culture     Status: None   Collection Time: 11/30/14 10:12 AM  Result Value Ref Range Status   Specimen Description URINE, CATHETERIZED  Final   Special Requests Immunocompromised  Final   Culture NO  GROWTH 2 DAYS  Final   Report Status 12/02/2014 FINAL  Final  Culture, blood (routine x 2)     Status: None   Collection Time: 12/06/14  8:08 PM  Result Value Ref Range Status   Specimen Description BLOOD  Final   Special Requests NONE  Final   Culture  Setup Time   Final    GRAM POSITIVE COCCI IN BOTH AEROBIC AND ANAEROBIC BOTTLES CRITICAL RESULT CALLED TO, READ BACK BY AND VERIFIED WITH: JENNIFER BEZARD AT 4259 12/08/14.PMH CONFIRMED BY [facility]    Culture   Final    STAPHYLOCOCCUS AURICULARIS IN BOTH AEROBIC AND ANAEROBIC BOTTLES    Report Status 12/11/2014 FINAL  Final   Organism ID, Bacteria STAPHYLOCOCCUS AURICULARIS  Final      Susceptibility   Staphylococcus auricularis - MIC*    CIPROFLOXACIN >=8 RESISTANT Resistant     ERYTHROMYCIN >=8 RESISTANT Resistant     GENTAMICIN <=0.5 SENSITIVE Sensitive     OXACILLIN >=4 RESISTANT Resistant     TETRACYCLINE <=1 SENSITIVE Sensitive     VANCOMYCIN 1 SENSITIVE Sensitive     CLINDAMYCIN <=0.25 SENSITIVE Sensitive     TRIMETH/SULFA Value in next row Sensitive      SENSITIVE<=20    LEVOFLOXACIN Value in next row Intermediate      INTERMEDIATE4    * STAPHYLOCOCCUS AURICULARIS  Culture, blood (routine x 2)     Status: None   Collection Time: 12/06/14  8:40 PM  Result Value Ref Range Status   Specimen Description BLOOD  Final   Special Requests NONE  Final   Culture NO GROWTH 5 DAYS  Final   Report Status 12/11/2014 FINAL  Final  Culture, blood (routine x 2)     Status: None   Collection Time: 12/08/14 11:40 AM  Result Value Ref Range Status   Specimen Description BLOOD  Final   Special Requests Normal  Final   Culture NO GROWTH 6 DAYS  Final   Report Status 12/14/2014 FINAL  Final  Culture, blood (routine x 2)     Status: None   Collection Time: 12/08/14 11:49 AM  Result Value Ref Range Status   Specimen Description BLOOD  Final   Special Requests Normal  Final   Culture NO GROWTH 6 DAYS  Final   Report Status 12/14/2014  FINAL  Final  C difficile quick scan w PCR reflex ([facility] only)  Status: None   Collection Time: 12/14/14 10:11 AM  Result Value Ref Range Status   C Diff antigen NEGATIVE  Final   C Diff toxin NEGATIVE  Final   C Diff interpretation Negative for C. difficile  Final  Culture, blood (routine x 2)     Status: None   Collection Time: 12/27/14 11:28 AM  Result Value Ref Range Status   Specimen Description BLOOD  Final   Special Requests Immunocompromised  Final   Culture  Setup Time   Final    YEAST AEROBIC BOTTLE ONLY CRITICAL RESULT CALLED TO, READ BACK BY AND VERIFIED WITH: CATHY SUMMERLIN ON 12/29/14 AT 1000AM BY JEF    Culture CANDIDA PARAPSILOSIS AEROBIC BOTTLE ONLY   Final   Report Status 01/02/2015 FINAL  Final  Culture, blood (routine x 2)     Status: None   Collection Time: 12/27/14 11:36 AM  Result Value Ref Range Status   Specimen Description BLOOD  Final   Special Requests Immunocompromised  Final   Culture  Setup Time   Final    YEAST AEROBIC BOTTLE ONLY CRITICAL RESULT CALLED TO, READ BACK BY AND VERIFIED WITH: LISA ROMERO AT 0111 ON 12/29/14 [facility] CONFIRMED BY [place]    Culture CANDIDA PARAPSILOSIS AEROBIC BOTTLE ONLY   Final   Report Status 01/02/2015 FINAL  Final  Cath Tip Culture     Status: None   Collection Time: 12/27/14  4:17 PM  Result Value Ref Range Status   Specimen Description CATH TIP  Final   Special Requests NONE  Final   Culture NO GROWTH 2 DAYS  Final   Report Status 12/30/2014 FINAL  Final  Culture, blood (routine x 2)     Status: None (Preliminary result)   Collection Time: 12/29/14 12:28 PM  Result Value Ref Range Status   Specimen Description BLOOD RIGHT ARM  Final   Special Requests   Final    BOTTLES DRAWN AEROBIC AND ANAEROBIC  AER 4CC ANA 3CC   Culture  Setup Time   Final    BUDDING YEAST SEEN AEROBIC BOTTLE ONLY CRITICAL RESULT CALLED TO, READ BACK BY AND VERIFIED WITH: BROKE ROBERTSON AT 2230 01/01/15.TSH CONFIRMED BY [facility]     Culture   Final    BUDDING YEAST SEEN AEROBIC BOTTLE ONLY IDENTIFICATION TO FOLLOW    Report Status PENDING  Incomplete  Culture, blood (routine x 2)     Status: None (Preliminary result)   Collection Time: 12/29/14 12:28 PM  Result Value Ref Range Status   Specimen Description BLOOD RIGHT FATTY CASTS  Final   Special Requests BOTTLES DRAWN AEROBIC AND ANAEROBIC  5CC  Final   Culture  Setup Time   Final    YEAST AEROBIC BOTTLE ONLY CRITICAL VALUE NOTED.  VALUE IS CONSISTENT WITH PREVIOUSLY REPORTED AND CALLED VALUE.    Culture   Final    YEAST IDENTIFICATION TO FOLLOW AEROBIC BOTTLE ONLY    Report Status PENDING  Incomplete  Cath Tip Culture     Status: None (Preliminary result)   Collection Time: 01/02/15 12:42 PM  Result Value Ref Range Status   Specimen Description CATH TIP  Final   Special Requests Normal  Final   Culture NO GROWTH < 24 HOURS  Final   Report Status PENDING  Incomplete  Culture, blood (routine x 2)     Status: None (Preliminary result)   Collection Time: 01/02/15  6:34 PM  Result Value Ref Range Status   Specimen Description BLOOD  RIGHT HAND  Final   Special Requests 5 BOTTLES DRAWN AEROBIC AND ANAEROBIC 5ML  Final   Culture NO GROWTH < 24 HOURS  Final   Report Status PENDING  Incomplete    Coagulation Studies: No results for input(s): LABPROT, INR in the last 72 hours.  Urinalysis: No results for input(s): COLORURINE, LABSPEC, PHURINE, GLUCOSEU, HGBUR, BILIRUBINUR, KETONESUR, PROTEINUR, UROBILINOGEN, NITRITE, LEUKOCYTESUR in the last 72 hours.  Invalid input(s): APPERANCEUR    Imaging: Dg Abd 1 View  01/02/2015   CLINICAL DATA:  Abdominal distension, patient has been hospitalized for 4 days; history of chronic renal insufficiency and lymphoma and ileus.  EXAM: ABDOMEN - 1 VIEW  COMPARISON:  Abdominal series of December 23, 2014  FINDINGS: There are loops of mildly distended gas-filled small bowel predominantly in the right mid abdomen. There are loops  of mildly distended gas-filled colon in the left mid abdomen. There is a femoral venous catheter in place with the tip lying at approximately the L2 level. There is a double pigtail stent on the left. There are degenerative changes of the lumbar spine.  IMPRESSION: Bowel gas pattern suggests a generalized ileus though a partial mid colonic obstruction is not absolutely excluded. Abdominal and pelvic CT scanning is recommended if the patient's distention is worsening.   Electronically Signed   By: David  Martinique M.D.   On: 01/02/2015 17:03     Medications:   . sodium chloride    . dextrose 5 % and 0.45% NaCl 40 mL/hr at 01/02/15 2208   . amiodarone  200 mg Oral Daily  . antiseptic oral rinse  7 mL Mouth Rinse BID  . carvedilol  20 mg Oral Daily  . Chlorhexidine Gluconate Cloth  6 each Topical Once  . dronabinol  2.5 mg Oral BID AC  . epoetin (EPOGEN/PROCRIT) injection  14,000 Units Subcutaneous Q T,Th,Sa-HD  . feeding supplement (ENSURE ENLIVE)  237 mL Oral BID BM  . feeding supplement (NEPRO CARB STEADY)  237 mL Oral BID BM  . heparin subcutaneous  5,000 Units Subcutaneous 3 times per day  . insulin aspart  0-9 Units Subcutaneous Q6H  . micafungin (MYCAMINE) IV  100 mg Intravenous Daily  . mometasone-formoterol  2 puff Inhalation BID  . pantoprazole (PROTONIX) IV  40 mg Intravenous Q12H  . scopolamine  1 patch Transdermal Q72H  . ziprasidone  10 mg Intramuscular QHS   acetaminophen **OR** acetaminophen, albuterol, haloperidol lactate, lidocaine (PF), lidocaine-prilocaine, magnesium hydroxide, metoprolol, morphine injection, ondansetron (ZOFRAN) IV, promethazine, sodium chloride, sodium chloride  Assessment/ Plan:  Mr. Risdon is a 24 black male with progressive mantle cell lymphoma, RICE chemo in 11/2014, hx left sided hydronephrosis and hydroureter. S/p ureteral stent placement   Hospital course complicated by sepsis with Klebsiella oxytoca, hypernatremia. Now with fungemia.   1.  Acute  renal failure due to ATN: Pt has undergone multiple dialysis sessions.  Has temporary dialysis catheter in place which was replaced 7/17.  Can't place permcath now due to fungemia. - UOP slightly greater than before, will plan for HD again tomorrow, no acute indication today.  2.  Sepsis with fungemia. A41.9, B49. Status post bacteremia with klebsiella oxytoca: completed two weeks for IV ceftazidime. Subsequently growing candida parapsilosis.  Subcutaneous port removed 01/02/15. - repeat blood cultures taken yesterday, port now removed, still has temp HD catheter in place, remains on micafungin.   3.  Anemia of chronic kidney disease/mantle cell lymphoma:  hgb 7.0, continue epogen 14000 units IV with HD.  LOS: [place], Suzi Hernan 7/22/20162:44 PM

## 2015-01-03 NOTE — Progress Notes (Signed)
[facility]  Daily Progress Note   Subjective: Post Op Day #1: Removal of Right Jugular Port-a-Cath.   Patient is without complaint. Denies any pain or drainage from port site.   Objective: Filed Vitals:   01/02/15 1632 01/02/15 2017 01/03/15 0651 01/03/15 0832  BP: 102/44 94/54 96/46  93/52  Pulse: 94 94 95 95  Temp: 97.6 F (36.4 C) 100.4 F (38 C) 100.1 F (37.8 C) 98.4 F (36.9 C)  TempSrc: Oral Axillary Oral Oral  Resp: 20 18 20 17   Height:      Weight:   74.3 kg (163 lb 12.8 oz)   SpO2: 94% 96% 100% 100%    Intake/Output Summary (Last 24 hours) at 01/03/15 1144 Last data filed at 01/03/15 0830  Gross per 24 hour  Intake      0 ml  Output    300 ml  Net   -300 ml    Physical Exam: A&Ox3, NAD CV: RRR Pulmonary: CTA Bilaterally Right Chest: Port has been removed. Incision clean, dry and intact. No erythema, swelling or drainage noted.    Laboratory: CBC    Component Value Date/Time   WBC 16.2* 01/03/2015 0444   WBC 6.4 07/17/2014 1121   HGB 7.0* 01/03/2015 0444   HGB 11.8* 07/17/2014 1121   HCT 21.2* 01/03/2015 0444   HCT 35.0* 07/17/2014 1121   PLT 88* 01/03/2015 0444   PLT 263 07/17/2014 1121    BMET    Component Value Date/Time   NA 140 01/03/2015 0444   NA 141 07/17/2014 1121   K 2.9* 01/03/2015 0444   K 2.8* 07/17/2014 1121   CL 100* 01/03/2015 0444   CL 100 07/17/2014 1121   CO2 28 01/03/2015 0444   CO2 32 07/17/2014 1121   GLUCOSE 105* 01/03/2015 0444   GLUCOSE 105* 07/17/2014 1121   BUN 35* 01/03/2015 0444   BUN 9 07/17/2014 1121   CREATININE 4.75* 01/03/2015 0444   CREATININE 1.09 07/17/2014 1121   CALCIUM 7.4* 01/03/2015 0444   CALCIUM 8.9 07/17/2014 1121   GFRNONAA 12* 01/03/2015 0444   GFRNONAA >60 02/11/2014 0851   GFRAA 13* 01/03/2015 0444   GFRAA >60 02/11/2014 0851    Assessment/Planning: The patient is a 69 y.o. male with fungemia post op day #1 of Right IJ Port-a-cath Removal. 1) Port site doing  well. Incision clean, dry and intact. No drainage, swelling or erythema noted.  Marcelle Overlie PA-C 01/03/2015 11:44 AM

## 2015-01-03 NOTE — Progress Notes (Signed)
Physical Therapy Treatment Patient Details Name: Caleb Francis MRN: [id number] DOB: [birth year] Today's Date: 01/03/2015    History of Present Illness Pt is a 69 y.o. 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  Temporary R dialysis catheter placed in R groin 12/17/14 (removed and replaced 7/17 d/t infection/fevers) and currently receiving dialysis.  Pt was in ICU but currently on floor.  Pt with recent admiission 11/08/14-11/19/14 for sepsis and AMS d/t UTI.  Pt went to [facility] prior to re-admission. Plan for pema cath next week. Port removed on 01/02/15 secondary to fungemia.     PT Comments    Pt is making slight progress towards goals, however continues to be limited secondary to temp cath in place. Per chart review, perm cath to be placed soon to assist in mobility. Pt agitated this session, frustrated that he is bedbound (per policy). Pt agreeable to limited there-ex on L LE, however requires mod assist for completion. Heavy encouragement required for participation. PT goals will expire on 01/07/15.  Follow Up Recommendations  SNF     Equipment Recommendations       Recommendations for Other Services       Precautions / Restrictions Precautions Precautions: Fall Restrictions Weight Bearing Restrictions: No    Mobility  Bed Mobility               General bed mobility comments: Deferred mobility per policy d/t R groin temporary dialysis catheter  Transfers                    Ambulation/Gait                 Stairs            Wheelchair Mobility    Modified Rankin (Stroke Patients Only)       Balance                                    Cognition Arousal/Alertness: Lethargic Behavior During Therapy: Flat affect Overall Cognitive Status: Impaired/Different from baseline (pt confused on HD temp  cath, however oriented to place/time)                      Exercises Other Exercises Other Exercises: Pt performed limited ther-ex this date including B ankle pumps, L SLRs, hip abd/add, and quad sets. Pt requires mod assist for completion and heavy encouragement for completion. Pt wished to use the bedpan, therefore further therapy interrupted.    General Comments        Pertinent Vitals/Pain Pain Assessment: No/denies pain    Home Living                      Prior Function            PT Goals (current goals can now be found in the care plan section) Acute Rehab PT Goals Patient Stated Goal: to use the bathroom PT Goal Formulation: With patient Time For Goal Achievement: 01/07/15 Potential to Achieve Goals: Fair Progress towards PT goals: Not progressing toward goals - comment    Frequency  Min 2X/week    PT Plan Current plan remains appropriate    Co-evaluation             End of Session  Activity Tolerance: Treatment limited secondary to agitation Patient left: in bed;with call bell/phone within reach;with bed alarm set;with nursing/sitter in room;with family/visitor present     Time: 5301-0404 PT Time Calculation (min) (ACUTE ONLY): 9 min  Charges:  $Therapeutic Exercise: 8-22 mins                    G Codes:      Jermine Bibbee Jan 06, 2015, 2:13 PM  Greggory Stallion, PT, DPT [phone]

## 2015-01-04 ENCOUNTER — Encounter: Payer: Self-pay | Admitting: Gastroenterology

## 2015-01-04 LAB — BASIC METABOLIC PANEL
Anion gap: 14 (ref 5–15)
BUN: 45 mg/dL — AB (ref 6–20)
CHLORIDE: 97 mmol/L — AB (ref 101–111)
CO2: 23 mmol/L (ref 22–32)
Calcium: 7.1 mg/dL — ABNORMAL LOW (ref 8.9–10.3)
Creatinine, Ser: 5.61 mg/dL — ABNORMAL HIGH (ref 0.61–1.24)
GFR calc Af Amer: 11 mL/min — ABNORMAL LOW (ref >60)
GFR calc non Af Amer: 9 mL/min — ABNORMAL LOW (ref >60)
GLUCOSE: 118 mg/dL — AB (ref 65–99)
POTASSIUM: 3.2 mmol/L — AB (ref 3.5–5.1)
Sodium: 134 mmol/L — ABNORMAL LOW (ref 135–145)

## 2015-01-04 LAB — CULTURE, BLOOD (ROUTINE X 2)

## 2015-01-04 LAB — CBC
HCT: 21.9 % — ABNORMAL LOW (ref 40.0–52.0)
Hemoglobin: 7.1 g/dL — ABNORMAL LOW (ref 13.0–18.0)
MCH: 28 pg (ref 26.0–34.0)
MCHC: 32.5 g/dL (ref 32.0–36.0)
MCV: 86.1 fL (ref 80.0–100.0)
PLATELETS: 122 10*3/uL — AB (ref 150–440)
RBC: 2.55 MIL/uL — AB (ref 4.40–5.90)
RDW: 19.1 % — ABNORMAL HIGH (ref 11.5–14.5)
WBC: 17.5 10*3/uL — ABNORMAL HIGH (ref 3.8–10.6)

## 2015-01-04 LAB — GLUCOSE, CAPILLARY
GLUCOSE-CAPILLARY: 96 mg/dL (ref 65–99)
GLUCOSE-CAPILLARY: 97 mg/dL (ref 65–99)
Glucose-Capillary: 162 mg/dL — ABNORMAL HIGH (ref 65–99)
Glucose-Capillary: 150 mg/dL — ABNORMAL HIGH (ref 65–99)

## 2015-01-04 MED ORDER — HEPARIN SODIUM (PORCINE) 1000 UNIT/ML IJ SOLN
3400.0000 [IU] | Freq: Once | INTRAMUSCULAR | Status: AC
  Administered 2015-01-04: 3400 [IU]

## 2015-01-04 NOTE — Progress Notes (Signed)
Subjective:  Pt seen during HD. Resting comfortably. No acute complaints at present, denies cramping.  Objective:  Vital signs in last 24 hours:  Temp:  [97.9 F (36.6 C)-99 F (37.2 C)] 97.9 F (36.6 C) (07/23 1030) Pulse Rate:  [86-98] 98 (07/23 1330) Resp:  [22-29] 24 (07/23 1330) BP: (94-124)/(55-65) 124/56 mmHg (07/23 1330) SpO2:  [96 %-100 %] 100 % (07/23 1130) Weight:  [74.1 kg (163 lb 5.8 oz)-74.2 kg (163 lb 9.3 oz)] 74.2 kg (163 lb 9.3 oz) (07/23 1030)  Weight change: 1.6 kg (3 lb 8.4 oz) Filed Weights   01/03/15 0651 01/04/15 0503 01/04/15 1030  Weight: 74.3 kg (163 lb 12.8 oz) 74.1 kg (163 lb 5.8 oz) 74.2 kg (163 lb 9.3 oz)    Intake/Output: I/O last 3 completed shifts: In: 211 [I.V.:211] Out: 300 [Urine:300]   Intake/Output this shift:  Total I/O In: 120 [P.O.:120] Out: -   Physical Exam: General: chronically ill appearing  Head: oral mucosa moist  Eyes: Anicteric  Neck: Supple, trachea midline  Lungs:  Clear to auscultation normal effort  Heart: S1S2 no rubs  Abdomen:  Soft, NTND, BS present  Extremities: No LE edema  Neurologic: Awake, alert, oriented to self and place, follows commands  Skin: No acute rashes  GU foley  Access: Right femoral dialysis catheter    Basic Metabolic Panel:  Recent Labs Lab 12/29/14 0430  12/31/14 0455 01/01/15 0453 01/02/15 0453 01/02/15 1303 01/03/15 0444 01/04/15 0418  NA 139  < > 137 139 136  --  140 134*  K 3.9  < > 3.1* 3.0* 2.8*  --  2.9* 3.2*  CL 101  < > 96* 100* 100*  --  100* 97*  CO2 22  < > 25 26 26   --  28 23  GLUCOSE 214*  < > 101* 112* 98  --  105* 118*  BUN 96*  < > 66* 38* 49*  --  35* 45*  CREATININE 6.51*  < > 5.98* 4.31* 5.48*  --  4.75* 5.61*  CALCIUM 7.8*  < > 7.3* 7.4* 6.9*  --  7.4* 7.1*  MG 2.0  --   --   --   --   --   --   --   PHOS 7.6*  --   --   --   --  2.3*  --   --   < > = values in this interval not displayed.  Liver Function Tests: No results for input(s): AST, ALT,  ALKPHOS, BILITOT, PROT, ALBUMIN in the last 168 hours. No results for input(s): LIPASE, AMYLASE in the last 168 hours. No results for input(s): AMMONIA in the last 168 hours.  CBC:  Recent Labs Lab 12/31/14 0455 01/01/15 0453 01/02/15 0453 01/03/15 0444 01/04/15 0418  WBC 11.1* 11.1* 14.5* 16.2* 17.5*  NEUTROABS  --  7.9*  --   --   --   HGB 7.3* 7.3* 7.4* 7.0* 7.1*  HCT 21.8* 21.6* 22.2* 21.2* 21.9*  MCV 83.4 84.1 84.4 85.1 86.1  PLT 58* 56* 66* 88* 122*    Cardiac Enzymes: No results for input(s): CKTOTAL, CKMB, CKMBINDEX, TROPONINI in the last 168 hours.  BNP: Invalid input(s): POCBNP  CBG:  Recent Labs Lab 01/03/15 0720 01/03/15 1356 01/03/15 1944 01/04/15 0232 01/04/15 0751  GLUCAP 102* 169* 128* 162* 96    Microbiology: Results for orders placed or performed during the hospital encounter of 11/29/14  MRSA PCR Screening     Status: None  Collection Time: 11/29/14 12:13 PM  Result Value Ref Range Status   MRSA by PCR NEGATIVE NEGATIVE Final    Comment:        The GeneXpert MRSA Assay (FDA approved for NASAL specimens only), is one component of a comprehensive MRSA colonization surveillance program. It is not intended to diagnose MRSA infection nor to guide or monitor treatment for MRSA infections.   Culture, blood (routine x 2)     Status: None   Collection Time: 11/29/14  1:19 PM  Result Value Ref Range Status   Specimen Description BLOOD  Final   Special Requests Immunocompromised  Final   Culture NO GROWTH 5 DAYS  Final   Report Status 12/04/2014 FINAL  Final  Culture, blood (routine x 2)     Status: None   Collection Time: 11/29/14  3:04 PM  Result Value Ref Range Status   Specimen Description Blood  Final   Special Requests Immunocompromised  Final   Report Status 01/03/2015 FINAL  Final  Urine culture     Status: None   Collection Time: 11/30/14 10:12 AM  Result Value Ref Range Status   Specimen Description URINE, CATHETERIZED  Final    Special Requests Immunocompromised  Final   Culture NO GROWTH 2 DAYS  Final   Report Status 12/02/2014 FINAL  Final  Culture, blood (routine x 2)     Status: None   Collection Time: 12/06/14  8:08 PM  Result Value Ref Range Status   Specimen Description BLOOD  Final   Special Requests NONE  Final   Culture  Setup Time   Final    GRAM POSITIVE COCCI IN BOTH AEROBIC AND ANAEROBIC BOTTLES CRITICAL RESULT CALLED TO, READ BACK BY AND VERIFIED WITH: JENNIFER BEZARD AT 4008 12/08/14.PM[facility] CONFIRMED BY [facility]    Culture   Final    STAPHYLOCOCCUS AURICULARIS IN BOTH AEROBIC AND ANAEROBIC BOTTLES    Report Status 12/11/2014 FINAL  Final   Organism ID, Bacteria STAPHYLOCOCCUS AURICULARIS  Final      Susceptibility   Staphylococcus auricularis - MIC*    CIPROFLOXACIN >=8 RESISTANT Resistant     ERYTHROMYCIN >=8 RESISTANT Resistant     GENTAMICIN <=0.5 SENSITIVE Sensitive     OXACILLIN >=4 RESISTANT Resistant     TETRACYCLINE <=1 SENSITIVE Sensitive     VANCOMYCIN 1 SENSITIVE Sensitive     CLINDAMYCIN <=0.25 SENSITIVE Sensitive     TRIMETH/SULFA Value in next row Sensitive      SENSITIVE<=20    LEVOFLOXACIN Value in next row Intermediate      INTERMEDIATE4    * STAPHYLOCOCCUS AURICULARIS  Culture, blood (routine x 2)     Status: None   Collection Time: 12/06/14  8:40 PM  Result Value Ref Range Status   Specimen Description BLOOD  Final   Special Requests NONE  Final   Culture NO GROWTH 5 DAYS  Final   Report Status 12/11/2014 FINAL  Final  Culture, blood (routine x 2)     Status: None   Collection Time: 12/08/14 11:40 AM  Result Value Ref Range Status   Specimen Description BLOOD  Final   Special Requests Normal  Final   Culture NO GROWTH 6 DAYS  Final   Report Status 12/14/2014 FINAL  Final  Culture, blood (routine x 2)     Status: None   Collection Time: 12/08/14 11:49 AM  Result Value Ref Range Status   Specimen Description BLOOD  Final   Special Requests Normal  Final  Culture NO GROWTH 6 DAYS  Final   Report Status 12/14/2014 FINAL  Final  C difficile quick scan w PCR reflex ([facility] only)     Status: None   Collection Time: 12/14/14 10:11 AM  Result Value Ref Range Status   C Diff antigen NEGATIVE  Final   C Diff toxin NEGATIVE  Final   C Diff interpretation Negative for C. difficile  Final  Culture, blood (routine x 2)     Status: None   Collection Time: 12/27/14 11:28 AM  Result Value Ref Range Status   Specimen Description BLOOD  Final   Special Requests Immunocompromised  Final   Culture  Setup Time   Final    YEAST AEROBIC BOTTLE ONLY CRITICAL RESULT CALLED TO, READ BACK BY AND VERIFIED WITH: CATHY SUMMERLIN ON 12/29/14 AT 1000AM BY JEF    Culture CANDIDA PARAPSILOSIS AEROBIC BOTTLE ONLY   Final   Report Status 01/02/2015 FINAL  Final  Culture, blood (routine x 2)     Status: None   Collection Time: 12/27/14 11:36 AM  Result Value Ref Range Status   Specimen Description BLOOD  Final   Special Requests Immunocompromised  Final   Culture  Setup Time   Final    YEAST AEROBIC BOTTLE ONLY CRITICAL RESULT CALLED TO, READ BACK BY AND VERIFIED WITH: LISA ROMERO AT 0111 ON 12/29/14 [facility] CONFIRMED BY [place]    Culture CANDIDA PARAPSILOSIS AEROBIC BOTTLE ONLY   Final   Report Status 01/02/2015 FINAL  Final  Cath Tip Culture     Status: None   Collection Time: 12/27/14  4:17 PM  Result Value Ref Range Status   Specimen Description CATH TIP  Final   Special Requests NONE  Final   Culture NO GROWTH 2 DAYS  Final   Report Status 12/30/2014 FINAL  Final  Culture, blood (routine x 2)     Status: None   Collection Time: 12/29/14 12:28 PM  Result Value Ref Range Status   Specimen Description BLOOD RIGHT ARM  Final   Special Requests   Final    BOTTLES DRAWN AEROBIC AND ANAEROBIC  AER 4CC ANA 3CC   Culture  Setup Time   Final    BUDDING YEAST SEEN AEROBIC BOTTLE ONLY CRITICAL RESULT CALLED TO, READ BACK BY AND VERIFIED WITH: BROKE ROBERTSON AT 2230  01/01/15.[facility] CONFIRMED BY [facility]    Culture CANDIDA PARAPSILOSIS AEROBIC BOTTLE ONLY   Final   Report Status 01/04/2015 FINAL  Final  Culture, blood (routine x 2)     Status: None   Collection Time: 12/29/14 12:28 PM  Result Value Ref Range Status   Specimen Description BLOOD RIGHT FATTY CASTS  Final   Special Requests BOTTLES DRAWN AEROBIC AND ANAEROBIC  5CC  Final   Culture  Setup Time   Final    YEAST AEROBIC BOTTLE ONLY CRITICAL VALUE NOTED.  VALUE IS CONSISTENT WITH PREVIOUSLY REPORTED AND CALLED VALUE.    Culture CANDIDA PARAPSILOSIS  Final   Report Status 01/04/2015 FINAL  Final  Cath Tip Culture     Status: None (Preliminary result)   Collection Time: 01/02/15 12:42 PM  Result Value Ref Range Status   Specimen Description CATH TIP  Final   Special Requests Normal  Final   Culture NO GROWTH 2 DAYS  Final   Report Status PENDING  Incomplete  Culture, blood (routine x 2)     Status: None (Preliminary result)   Collection Time: 01/02/15  6:34 PM  Result Value  Ref Range Status   Specimen Description BLOOD RIGHT HAND  Final   Special Requests 5 BOTTLES DRAWN AEROBIC AND ANAEROBIC 5ML  Final   Culture NO GROWTH 2 DAYS  Final   Report Status PENDING  Incomplete    Coagulation Studies: No results for input(s): LABPROT, INR in the last 72 hours.  Urinalysis: No results for input(s): COLORURINE, LABSPEC, PHURINE, GLUCOSEU, HGBUR, BILIRUBINUR, KETONESUR, PROTEINUR, UROBILINOGEN, NITRITE, LEUKOCYTESUR in the last 72 hours.  Invalid input(s): APPERANCEUR    Imaging: Dg Abd 1 View  01/02/2015   CLINICAL DATA:  Abdominal distension, patient has been hospitalized for 4 days; history of chronic renal insufficiency and lymphoma and ileus.  EXAM: ABDOMEN - 1 VIEW  COMPARISON:  Abdominal series of December 23, 2014  FINDINGS: There are loops of mildly distended gas-filled small bowel predominantly in the right mid abdomen. There are loops of mildly distended gas-filled colon in the left mid  abdomen. There is a femoral venous catheter in place with the tip lying at approximately the L2 level. There is a double pigtail stent on the left. There are degenerative changes of the lumbar spine.  IMPRESSION: Bowel gas pattern suggests a generalized ileus though a partial mid colonic obstruction is not absolutely excluded. Abdominal and pelvic CT scanning is recommended if the patient's distention is worsening.   Electronically Signed   By: David  Martinique M.D.   On: 01/02/2015 17:03     Medications:   . dextrose 5 % and 0.45% NaCl 40 mL/hr at 01/04/15 0256   . amiodarone  200 mg Oral Daily  . antiseptic oral rinse  7 mL Mouth Rinse BID  . carvedilol  20 mg Oral Daily  . Chlorhexidine Gluconate Cloth  6 each Topical Once  . dronabinol  2.5 mg Oral BID AC  . epoetin (EPOGEN/PROCRIT) injection  14,000 Units Subcutaneous Q T,Th,Sa-HD  . feeding supplement (ENSURE ENLIVE)  237 mL Oral BID BM  . feeding supplement (NEPRO CARB STEADY)  237 mL Oral BID BM  . heparin  3,400 Units Intracatheter Once  . heparin subcutaneous  5,000 Units Subcutaneous 3 times per day  . insulin aspart  0-9 Units Subcutaneous Q6H  . micafungin (MYCAMINE) IV  100 mg Intravenous Daily  . mometasone-formoterol  2 puff Inhalation BID  . pantoprazole (PROTONIX) IV  40 mg Intravenous Q12H  . scopolamine  1 patch Transdermal Q72H   acetaminophen **OR** acetaminophen, albuterol, haloperidol lactate, lidocaine (PF), lidocaine-prilocaine, magnesium hydroxide, metoprolol, morphine injection, ondansetron (ZOFRAN) IV, promethazine, sodium chloride, ziprasidone  Assessment/ Plan:  Caleb Francis is a 60 black male with progressive mantle cell lymphoma, RICE chemo in 11/2014, hx left sided hydronephrosis and hydroureter. S/p ureteral stent placement   Hospital course complicated by sepsis with Klebsiella oxytoca, hypernatremia. Now with fungemia.   1.  Acute renal failure due to ATN: Pt has undergone multiple dialysis sessions.  Has  temporary dialysis catheter in place which was replaced 7/17.  Can't place permcath now due to fungemia. - Pt seen during HD, tolerating well, UF target 0.5kg, will plan for HD again on Tuesday.  2.  Sepsis with fungemia. A41.9, B49. Status post bacteremia with klebsiella oxytoca: completed two weeks for IV ceftazidime. Subsequently growing candida parapsilosis.  Subcutaneous port removed 01/02/15. - repeat blood cultures negative, continue micafungin.  Had dialysis catheter in place that was changed out on 12/29/14.    3.  Anemia of chronic kidney disease/mantle cell lymphoma:  hgb 7.1 today, continue epogen 14000 units IV  with HD.   LOS: [place], Buckley Bradly 7/23/20161:35 PM

## 2015-01-04 NOTE — Progress Notes (Addendum)
[facility] [place] NAME: Caleb Francis    MR#:  [id number]  DATE OF BIRTH:  01-Mar-1946  SUBJECTIVE:  Trying to eat. Very weak. Not much interest in food  REVIEW OF SYSTEMS:   ROS Tolerating Diet:very little Tolerating PT: not yet due   DRUG ALLERGIES:  No Known Allergies  VITALS:  Blood pressure 107/66, pulse 100, temperature 97.9 F (36.6 C), temperature source Oral, resp. rate 29, height 6' (1.829 m), weight 74.2 kg (163 lb 9.3 oz), SpO2 100 %.  PHYSICAL EXAMINATION:   Physical Exam  GENERAL:  69 y.o.-year-old patient lying in the bed with no acute distress. Appears chronically ill EYES: Pupils equal, round, reactive to light and accommodation. No scleral icterus. Extraocular muscles intact.  HEENT: Head atraumatic, normocephalic. Oropharynx and nasopharynx clear.  NECK:  Supple, no jugular venous distention. No thyroid enlargement, no tenderness.  LUNGS: Normal breath sounds bilaterally, no wheezing, rales, rhonchi. No use of accessory muscles of respiration.  CARDIOVASCULAR: S1, S2 normal. No murmurs, rubs, or gallops.  ABDOMEN: Soft, nontender, nondistended. Bowel sounds present. No organomegaly or mass.  EXTREMITIES: No cyanosis, clubbing or edema b/l.   Right groin catheter NEUROLOGIC: Cranial nerves II through XII are intact. No focal Motor or sensory deficits b/l.  weak PSYCHIATRIC: The patient is alert and oriented x 3.  SKIN: stage II sacral ulcer.    LABORATORY PANEL:   CBC  Recent Labs Lab 01/04/15 0418  WBC 17.5*  HGB 7.1*  HCT 21.9*  PLT 122*    Chemistries   Recent Labs Lab 12/29/14 0430  01/04/15 0418  NA 139  < > 134*  K 3.9  < > 3.2*  CL 101  < > 97*  CO2 22  < > 23  GLUCOSE 214*  < > 118*  BUN 96*  < > 45*  CREATININE 6.51*  < > 5.61*  CALCIUM 7.8*  < > 7.1*  MG 2.0  --   --   < > = values in this interval not displayed.  Cardiac Enzymes No results for input(s): TROPONINI  in the last 168 hours.  RADIOLOGY:  Dg Abd 1 View  01/02/2015   CLINICAL DATA:  Abdominal distension, patient has been hospitalized for 4 days; history of chronic renal insufficiency and lymphoma and ileus.  EXAM: ABDOMEN - 1 VIEW  COMPARISON:  Abdominal series of December 23, 2014  FINDINGS: There are loops of mildly distended gas-filled small bowel predominantly in the right mid abdomen. There are loops of mildly distended gas-filled colon in the left mid abdomen. There is a femoral venous catheter in place with the tip lying at approximately the L2 level. There is a double pigtail stent on the left. There are degenerative changes of the lumbar spine.  IMPRESSION: Bowel gas pattern suggests a generalized ileus though a partial mid colonic obstruction is not absolutely excluded. Abdominal and pelvic CT scanning is recommended if the patient's distention is worsening.   Electronically Signed   By: David  Martinique M.D.   On: 01/02/2015 17:03     ASSESSMENT AND PLAN:   #1 sepsis: - Fungemia with Candida parapsilosis blood cultures 7/15, and again on repeat culture 7/17. Appreciate infectious disease consultation. Continue micafungin.  - Port-A-Cath removed on 7/21 by Dr. dew. - Bacteremia with Klebsiella oxytoca status post 2 weeks of IV ceftaz resolved - Bacteremia with coag negative Staphylococcus, treated with vancomycin, echo negative resolved - Hemodialysis catheter placed on the  17th, urethral catheter placed 26 days ago -  blood cultures from 7/21 remain negative so far we will remove hemodialysis catheter in the right groin and replace with PermCath in the chest for dialysis access ne t week  #2 acute renal failure due to ATN - Appreciate nephrology following, receiving hemodialysis - This is likely end stage renal disease, no improvement in renal function minimal urine output. He will need long-term dialysis  #3 Mantle cell lymphoma - Appreciate oncology following - Currently on Neupogen,  status post 2 units packed red blood cells on 6/25 and 1 unit on 7/2 - Will need premedication prior to any further transfusions including Benadryl and Tylenol - Subsequent pancytopenia - Prognosis is poor. The plan had been to continue chemotherapy with hopes of eventual stem cell transplant. At this point he is not eligible for continued treatment. Dr. Mike Gip feels that his Mantle cell lymphoma has been very aggressive and will likely recur.  #4 chronic ileus with GI bleeding: Clinically improving. No further bleeding. NG tube and rectal tube discontinued 7/12 - Continue PPI, high risk for further bleeding due to thrombocytopenia - Last seen by GI on July 4. No further plans for endoscopy. - Persistent ileus seen on KUB 7/21, is passing watery stools. We'll reconsult gastroenterology as the ileus may be contributing to his lack of appetite and low oral intake. He may need an NG tube in place. Not at this point be a surgical candidate.  #5 deconditioning: Unable to participate in physical therapy due to femoral hemodialysis catheter. Working on alternative placement for hemodialysis access that would allow him to participate in physical therapy and progress towards discharge.  #6 protein calorie malnutrition: Continue Marinol. Encourage intake. Discussed with family and the patient they would not be interested in PEG tube placement. We'll discuss ileus with gastroenterology.  #7 encephalopathy: Appreciate neurology consultation. This is likely encephalopathy/delirium due to critical illness. Agree with when necessary Geodon for agitation. Has not been agitated  #8 pressure ulcers: Wound care consultation.  CODE STATUS: DO NOT RESUSCITATE  Case discussed with Care Management/Social Worker. Management plans discussed with the patient, family and they are in agreement.  TOTAL TIME TAKING CARE OF THIS PATIENT: 25 minutes.  >50% time spent on counselling and coordination of care  [name]  M.D on 01/04/2015 at 3:17 PM  Between 7am to 6pm - Pager - [phone]  After 6pm go to [email or web address] - password [email or web address] [facility]  [facility]  Office  [phone]  CC: Primary care physician; No PCP Per Patient

## 2015-01-04 NOTE — Progress Notes (Signed)
Brief Nutrition Note:   Spoke with MD Posey Pronto regarding nutritional poc. MD would like strict calorie count over the weekend as this will determine discharge plan; calorie count remains active from previous order.  Diet advanced to Regular yesterday, appears per documentation in calorie count folder that pt continues to eat little, mostly bites of solid food, drinks some liquid. This AM, pt with breakfast tray in front of him, no one assisting pt. Pt appeared to habe not eaten anything. With MD Posey Pronto present, pt took a bite of applesauce with encouragement. Pt very weel and needs assistance at meal times. Pt told writer that he did not want anything to drink, did not appear to want anything to eat. Asked pt if family brought in food yesterday from outside for pt to eat; pt reports, "if they did, I didn't see it." No family present on visit today. Calorie count to continue; pt requires feeding assistance at meal times. Orders in place. Continues all supplements. Discussed with Nelida Gores.   Kerman Passey [place], [place], LDN [phone] Pager

## 2015-01-04 NOTE — Progress Notes (Signed)
Spoke with Dr Bridgett Larsson.  [name] Geodon was to be changed to PRN per Dr Jayme Cloud on 01/01/15, but order never put in.  Pt not agitated at this time and sleeps well.  Dr Bridgett Larsson okay'd change to PRN.   Lynnda Shields, RN

## 2015-01-04 NOTE — Progress Notes (Signed)
Spoke with Dr Wynetta Emery regarding positive blood cultures.  MD acknowledged.  Lynnda Shields, RN

## 2015-01-04 NOTE — Consult Note (Signed)
  Asked to see pt for poor oral intake and to see if ileus contributing to this. Pt originally seen by Dr. Allen Norris. Last seen by Ranell Patrick NP on 7/8. NGT pulled on 7/12. Unfortunately, pt not in room. Went down for HD. Usually, ileus is not the main reason for poor oral intake. Pt and family had refused PEG placement in the past. Pt with multiple medical issues, including fungemia, renal failure, and poorly controlled lymphoma. These are probably all contributing to pt's failure to thrive. Agree with strict calorie count. Will check back on pt tomorrow AM. Thanks.

## 2015-01-05 ENCOUNTER — Inpatient Hospital Stay
Admit: 2015-01-05 | Discharge: 2015-01-05 | Disposition: A | Discharge status: Home / Self Care | Payer: Medicare Other | Attending: Infectious Diseases | Admitting: Infectious Diseases

## 2015-01-05 ENCOUNTER — Inpatient Hospital Stay: Payer: Medicare Other

## 2015-01-05 LAB — CATH TIP CULTURE
CULTURE: NO GROWTH
SPECIAL REQUESTS: NORMAL

## 2015-01-05 LAB — GLUCOSE, CAPILLARY
GLUCOSE-CAPILLARY: 92 mg/dL (ref 65–99)
Glucose-Capillary: 113 mg/dL — ABNORMAL HIGH (ref 65–99)
Glucose-Capillary: 105 mg/dL — ABNORMAL HIGH (ref 65–99)
Glucose-Capillary: 144 mg/dL — ABNORMAL HIGH (ref 65–99)

## 2015-01-05 LAB — PROTIME-INR
INR: 1.25
Prothrombin Time: 15.9 seconds — ABNORMAL HIGH (ref 11.4–15.0)

## 2015-01-05 MED ORDER — WARFARIN - PHARMACIST DOSING INPATIENT: Freq: Every day | Status: DC

## 2015-01-05 MED ORDER — WARFARIN SODIUM 5 MG PO TABS
5.0000 mg | ORAL_TABLET | Freq: Every day | ORAL | Status: DC
  Administered 2015-01-05: 5 mg via ORAL
  Filled 2015-01-05: qty 1

## 2015-01-05 MED ORDER — APIXABAN 5 MG PO TABS
2.5000 mg | ORAL_TABLET | Freq: Two times a day (BID) | ORAL | Status: DC
  Administered 2015-01-05 – 2015-01-21 (×32): 2.5 mg via ORAL
  Filled 2015-01-05 (×33): qty 1

## 2015-01-05 NOTE — Consult Note (Signed)
  Pt again not in room. Went down for U/S. According to nurse, oral intake is slowly improving. Pt having bowel movements, mostly diarrhea. Abdomen distended. No nausea/vomiting. Will either check back later today or tomorrow when pt is available. Would repeat another KUB by tomorrow to compare to. Thanks.

## 2015-01-05 NOTE — Progress Notes (Signed)
ID E note FU bcx from 7/21 turned + for yeast.  Repeat 7/24 pending.  Low grade temps persist.  WBC remains elevated. No echo done.  UE Superficail clot also noted. I reviewed Dr Elmon Else note.  Rec Check TTE - ordered Agree with removal of temp cath. Would repeat bcx after removed and not replace until neg x 48-72 hours if possible Cont micafungin

## 2015-01-05 NOTE — Progress Notes (Signed)
Subjective:  Pt had HD yesterday. Blood cultures from 7/21 still showing yeast. Discussed with Dr. Posey Pronto. Will plan to remove temp HD catheter today. Urine outpt was higher yesterday.  Objective:  Vital signs in last 24 hours:  Temp:  [97.9 F (36.6 C)-100.5 F (38.1 C)] 98.7 F (37.1 C) (07/24 0510) Pulse Rate:  [91-108] 101 (07/24 0510) Resp:  [18-30] 18 (07/24 0510) BP: (93-124)/(49-66) 103/49 mmHg (07/24 0510) SpO2:  [100 %] 100 % (07/24 0510) Weight:  [71.351 kg (157 lb 4.8 oz)-74.2 kg (163 lb 9.3 oz)] 71.351 kg (157 lb 4.8 oz) (07/24 0510)  Weight change: 0.1 kg (3.5 oz) Filed Weights   01/04/15 1030 01/04/15 1536 01/05/15 0510  Weight: 74.2 kg (163 lb 9.3 oz) 74.072 kg (163 lb 4.8 oz) 71.351 kg (157 lb 4.8 oz)    Intake/Output: I/O last 3 completed shifts: In: 240 [P.O.:240] Out: 1400 [Urine:900; Other:500]   Intake/Output this shift:  Total I/O In: 360 [P.O.:360] Out: -   Physical Exam: General: chronically ill appearing  Head: oral mucosa moist  Eyes: Anicteric  Neck: Supple, trachea midline  Lungs:  Clear to auscultation normal effort  Heart: S1S2 no rubs  Abdomen:  Soft, NTND, BS present  Extremities: No LE edema  Neurologic: Awake, alert, oriented to self and place, follows commands  Skin: No acute rashes  GU foley  Access: Right femoral dialysis catheter    Basic Metabolic Panel:  Recent Labs Lab 12/31/14 0455 01/01/15 0453 01/02/15 0453 01/02/15 1303 01/03/15 0444 01/04/15 0418  NA 137 139 136  --  140 134*  K 3.1* 3.0* 2.8*  --  2.9* 3.2*  CL 96* 100* 100*  --  100* 97*  CO2 25 26 26   --  28 23  GLUCOSE 101* 112* 98  --  105* 118*  BUN 66* 38* 49*  --  35* 45*  CREATININE 5.98* 4.31* 5.48*  --  4.75* 5.61*  CALCIUM 7.3* 7.4* 6.9*  --  7.4* 7.1*  PHOS  --   --   --  2.3*  --   --     Liver Function Tests: No results for input(s): AST, ALT, ALKPHOS, BILITOT, PROT, ALBUMIN in the last 168 hours. No results for input(s): LIPASE,  AMYLASE in the last 168 hours. No results for input(s): AMMONIA in the last 168 hours.  CBC:  Recent Labs Lab 12/31/14 0455 01/01/15 0453 01/02/15 0453 01/03/15 0444 01/04/15 0418  WBC 11.1* 11.1* 14.5* 16.2* 17.5*  NEUTROABS  --  7.9*  --   --   --   HGB 7.3* 7.3* 7.4* 7.0* 7.1*  HCT 21.8* 21.6* 22.2* 21.2* 21.9*  MCV 83.4 84.1 84.4 85.1 86.1  PLT 58* 56* 66* 88* 122*    Cardiac Enzymes: No results for input(s): CKTOTAL, CKMB, CKMBINDEX, TROPONINI in the last 168 hours.  BNP: Invalid input(s): POCBNP  CBG:  Recent Labs Lab 01/04/15 0751 01/04/15 1535 01/04/15 1956 01/05/15 0209 01/05/15 0748  GLUCAP 96 97 150* 113* 12    Microbiology: Results for orders placed or performed during the hospital encounter of 11/29/14  MRSA PCR Screening     Status: None   Collection Time: 11/29/14 12:13 PM  Result Value Ref Range Status   MRSA by PCR NEGATIVE NEGATIVE Final    Comment:        The GeneXpert MRSA Assay (FDA approved for NASAL specimens only), is one component of a comprehensive MRSA colonization surveillance program. It is not intended to diagnose MRSA  infection nor to guide or monitor treatment for MRSA infections.   Culture, blood (routine x 2)     Status: None   Collection Time: 11/29/14  1:19 PM  Result Value Ref Range Status   Specimen Description BLOOD  Final   Special Requests Immunocompromised  Final   Culture NO GROWTH 5 DAYS  Final   Report Status 12/04/2014 FINAL  Final  Culture, blood (routine x 2)     Status: None   Collection Time: 11/29/14  3:04 PM  Result Value Ref Range Status   Specimen Description Blood  Final   Special Requests Immunocompromised  Final   Report Status 01/03/2015 FINAL  Final  Urine culture     Status: None   Collection Time: 11/30/14 10:12 AM  Result Value Ref Range Status   Specimen Description URINE, CATHETERIZED  Final   Special Requests Immunocompromised  Final   Culture NO GROWTH 2 DAYS  Final   Report  Status 12/02/2014 FINAL  Final  Culture, blood (routine x 2)     Status: None   Collection Time: 12/06/14  8:08 PM  Result Value Ref Range Status   Specimen Description BLOOD  Final   Special Requests NONE  Final   Culture  Setup Time   Final    GRAM POSITIVE COCCI IN BOTH AEROBIC AND ANAEROBIC BOTTLES CRITICAL RESULT CALLED TO, READ BACK BY AND VERIFIED WITH: JENNIFER BEZARD AT 5277 12/08/14.PMH CONFIRMED BY RWW    Culture   Final    STAPHYLOCOCCUS AURICULARIS IN BOTH AEROBIC AND ANAEROBIC BOTTLES    Report Status 12/11/2014 FINAL  Final   Organism ID, Bacteria STAPHYLOCOCCUS AURICULARIS  Final      Susceptibility   Staphylococcus auricularis - MIC*    CIPROFLOXACIN >=8 RESISTANT Resistant     ERYTHROMYCIN >=8 RESISTANT Resistant     GENTAMICIN <=0.5 SENSITIVE Sensitive     OXACILLIN >=4 RESISTANT Resistant     TETRACYCLINE <=1 SENSITIVE Sensitive     VANCOMYCIN 1 SENSITIVE Sensitive     CLINDAMYCIN <=0.25 SENSITIVE Sensitive     TRIMETH/SULFA Value in next row Sensitive      SENSITIVE<=20    LEVOFLOXACIN Value in next row Intermediate      INTERMEDIATE4    * STAPHYLOCOCCUS AURICULARIS  Culture, blood (routine x 2)     Status: None   Collection Time: 12/06/14  8:40 PM  Result Value Ref Range Status   Specimen Description BLOOD  Final   Special Requests NONE  Final   Culture NO GROWTH 5 DAYS  Final   Report Status 12/11/2014 FINAL  Final  Culture, blood (routine x 2)     Status: None   Collection Time: 12/08/14 11:40 AM  Result Value Ref Range Status   Specimen Description BLOOD  Final   Special Requests Normal  Final   Culture NO GROWTH 6 DAYS  Final   Report Status 12/14/2014 FINAL  Final  Culture, blood (routine x 2)     Status: None   Collection Time: 12/08/14 11:49 AM  Result Value Ref Range Status   Specimen Description BLOOD  Final   Special Requests Normal  Final   Culture NO GROWTH 6 DAYS  Final   Report Status 12/14/2014 FINAL  Final  C difficile quick  scan w PCR reflex ([facility] only)     Status: None   Collection Time: 12/14/14 10:11 AM  Result Value Ref Range Status   C Diff antigen NEGATIVE  Final   C  Diff toxin NEGATIVE  Final   C Diff interpretation Negative for C. difficile  Final  Culture, blood (routine x 2)     Status: None   Collection Time: 12/27/14 11:28 AM  Result Value Ref Range Status   Specimen Description BLOOD  Final   Special Requests Immunocompromised  Final   Culture  Setup Time   Final    YEAST AEROBIC BOTTLE ONLY CRITICAL RESULT CALLED TO, READ BACK BY AND VERIFIED WITH: CATHY SUMMERLIN ON 12/29/14 AT 1000AM BY JEF    Culture CANDIDA PARAPSILOSIS AEROBIC BOTTLE ONLY   Final   Report Status 01/02/2015 FINAL  Final  Culture, blood (routine x 2)     Status: None   Collection Time: 12/27/14 11:36 AM  Result Value Ref Range Status   Specimen Description BLOOD  Final   Special Requests Immunocompromised  Final   Culture  Setup Time   Final    YEAST AEROBIC BOTTLE ONLY CRITICAL RESULT CALLED TO, READ BACK BY AND VERIFIED WITH: LISA ROMERO AT 0111 ON 12/29/14 RWW CONFIRMED BY [place]    Culture CANDIDA PARAPSILOSIS AEROBIC BOTTLE ONLY   Final   Report Status 01/02/2015 FINAL  Final  Cath Tip Culture     Status: None   Collection Time: 12/27/14  4:17 PM  Result Value Ref Range Status   Specimen Description CATH TIP  Final   Special Requests NONE  Final   Culture NO GROWTH 2 DAYS  Final   Report Status 12/30/2014 FINAL  Final  Culture, blood (routine x 2)     Status: None   Collection Time: 12/29/14 12:28 PM  Result Value Ref Range Status   Specimen Description BLOOD RIGHT ARM  Final   Special Requests   Final    BOTTLES DRAWN AEROBIC AND ANAEROBIC  AER 4CC ANA 3CC   Culture  Setup Time   Final    BUDDING YEAST SEEN AEROBIC BOTTLE ONLY CRITICAL RESULT CALLED TO, READ BACK BY AND VERIFIED WITH: BROKE ROBERTSON AT 2230 01/01/15.TSH CONFIRMED BY SDR    Culture CANDIDA PARAPSILOSIS AEROBIC BOTTLE ONLY   Final    Report Status 01/04/2015 FINAL  Final  Culture, blood (routine x 2)     Status: None   Collection Time: 12/29/14 12:28 PM  Result Value Ref Range Status   Specimen Description BLOOD RIGHT FATTY CASTS  Final   Special Requests BOTTLES DRAWN AEROBIC AND ANAEROBIC  5CC  Final   Culture  Setup Time   Final    YEAST AEROBIC BOTTLE ONLY CRITICAL VALUE NOTED.  VALUE IS CONSISTENT WITH PREVIOUSLY REPORTED AND CALLED VALUE.    Culture CANDIDA PARAPSILOSIS  Final   Report Status 01/04/2015 FINAL  Final  Cath Tip Culture     Status: None   Collection Time: 01/02/15 12:42 PM  Result Value Ref Range Status   Specimen Description CATH TIP  Final   Special Requests Normal  Final   Culture NO GROWTH 3 DAYS  Final   Report Status 01/05/2015 FINAL  Final  Culture, blood (routine x 2)     Status: None (Preliminary result)   Collection Time: 01/02/15  6:34 PM  Result Value Ref Range Status   Specimen Description BLOOD RIGHT HAND  Final   Special Requests 5 BOTTLES DRAWN AEROBIC AND ANAEROBIC 5ML  Final   Culture  Setup Time   Final    BUDDING YEAST SEEN ANAEROBIC BOTTLE ONLY CRITICAL RESULT CALLED TO, READ BACK BY AND VERIFIED WITH: IRIS GUIDRY AT  2030 01/04/15 SDR  CONFIRMED BY RW    Culture YEAST IDENTIFICATION TO FOLLOW   Final   Report Status PENDING  Incomplete    Coagulation Studies: No results for input(s): LABPROT, INR in the last 72 hours.  Urinalysis: No results for input(s): COLORURINE, LABSPEC, PHURINE, GLUCOSEU, HGBUR, BILIRUBINUR, KETONESUR, PROTEINUR, UROBILINOGEN, NITRITE, LEUKOCYTESUR in the last 72 hours.  Invalid input(s): APPERANCEUR    Imaging: No results found.   Medications:     . amiodarone  200 mg Oral Daily  . antiseptic oral rinse  7 mL Mouth Rinse BID  . carvedilol  20 mg Oral Daily  . Chlorhexidine Gluconate Cloth  6 each Topical Once  . dronabinol  2.5 mg Oral BID AC  . epoetin (EPOGEN/PROCRIT) injection  14,000 Units Subcutaneous Q T,Th,Sa-HD  .  feeding supplement (NEPRO CARB STEADY)  237 mL Oral BID BM  . heparin subcutaneous  5,000 Units Subcutaneous 3 times per day  . insulin aspart  0-9 Units Subcutaneous Q6H  . micafungin (MYCAMINE) IV  100 mg Intravenous Daily  . mometasone-formoterol  2 puff Inhalation BID  . pantoprazole (PROTONIX) IV  40 mg Intravenous Q12H  . scopolamine  1 patch Transdermal Q72H   acetaminophen **OR** acetaminophen, albuterol, haloperidol lactate, lidocaine (PF), lidocaine-prilocaine, magnesium hydroxide, metoprolol, ondansetron (ZOFRAN) IV, promethazine, sodium chloride, ziprasidone  Assessment/ Plan:  Mr. Pedley is a 77 black male with progressive mantle cell lymphoma, RICE chemo in 11/2014, hx left sided hydronephrosis and hydroureter. S/p ureteral stent placement   Hospital course complicated by sepsis with Klebsiella oxytoca, hypernatremia. Now with fungemia.   1.  Acute renal failure due to ATN N17.0: Pt has undergone multiple dialysis sessions.  Has temporary dialysis catheter in place which was replaced 7/17.  Can't place permcath now due to fungemia. - Pt had HD yesterday, UOP up to 900.  Still pt most likely has ESRD.  However as above can't place permcath now given fungemia.  Cultures from 7/21 still showing yeast.  Therefore we will d/c temp HD catheter in right groin and try to leave catheter free for 3-4 days as tolerated.   2.  Sepsis with fungemia. A41.9, B49. Status post bacteremia with klebsiella oxytoca: completed two weeks for IV ceftazidime. Subsequently growing candida parapsilosis.  Subcutaneous port removed 01/02/15. - blood cultures from 7/21 now showing yeast despite micafungin.  Will d/c temp dialysis catheter at this time.  Leave catheter free for 3-4 days as tolerated.  Reassess blood cultures.   3.  Anemia of chronic kidney disease/mantle cell lymphoma:  continue epogen 14000 units IV with HD, use OK'd by hematology/onc.   LOS: [place], Parneet Glantz 7/24/201610:21 AM

## 2015-01-05 NOTE — Progress Notes (Signed)
[facility] [place] NAME: Caleb Francis    MR#:  [id number]  DATE OF BIRTH:  Mar 28, 1946  SUBJECTIVE:  Trying to eat. Very weak. in better spirits today!!  REVIEW OF SYSTEMS:   Review of Systems  Constitutional: Positive for malaise/fatigue. Negative for fever, chills and weight loss.  HENT: Negative for ear discharge, ear pain and nosebleeds.   Eyes: Negative for blurred vision, pain and discharge.  Respiratory: Negative for sputum production, shortness of breath, wheezing and stridor.   Cardiovascular: Negative for chest pain, palpitations, orthopnea and PND.  Gastrointestinal: Negative for nausea, vomiting, abdominal pain and diarrhea.  Genitourinary: Negative for urgency and frequency.  Musculoskeletal: Negative for back pain and joint pain.  Neurological: Positive for weakness. Negative for sensory change, speech change and focal weakness.  Psychiatric/Behavioral: Negative for depression. The patient is not nervous/anxious.    Tolerating Diet:very little Tolerating PT: not yet due to groin HD cath   DRUG ALLERGIES:  No Known Allergies  VITALS:  Blood pressure 103/49, pulse 101, temperature 98.7 F (37.1 C), temperature source Oral, resp. rate 18, height 6' (1.829 m), weight 71.351 kg (157 lb 4.8 oz), SpO2 100 %.  PHYSICAL EXAMINATION:   Physical Exam  GENERAL:  69 y.o.-year-old patient lying in the bed with no acute distress. Appears chronically ill EYES: Pupils equal, round, reactive to light and accommodation. No scleral icterus. Extraocular muscles intact.  HEENT: Head atraumatic, normocephalic. Oropharynx and nasopharynx clear.  NECK:  Supple, no jugular venous distention. No thyroid enlargement, no tenderness.  LUNGS: Normal breath sounds bilaterally, no wheezing, rales, rhonchi. No use of accessory muscles of respiration.  CARDIOVASCULAR: S1, S2 normal. No murmurs, rubs, or gallops.  ABDOMEN: Soft, nontender,  nondistended. Bowel sounds present. No organomegaly or mass.  EXTREMITIES: No cyanosis, clubbing or edema b/l.   Right groin catheter Left arm swelling + NEUROLOGIC: Cranial nerves II through XII are intact. No focal Motor or sensory deficits b/l.  weak PSYCHIATRIC: The patient is alert and oriented x 3.  SKIN: stage II sacral ulcer.    LABORATORY PANEL:   CBC  Recent Labs Lab 01/04/15 0418  WBC 17.5*  HGB 7.1*  HCT 21.9*  PLT 122*    Chemistries   Recent Labs Lab 01/04/15 0418  NA 134*  K 3.2*  CL 97*  CO2 23  GLUCOSE 118*  BUN 45*  CREATININE 5.61*  CALCIUM 7.1*   ASSESSMENT AND PLAN:   #1 sepsis: - Fungemia with Candida parapsilosis blood cultures 7/15, and again on repeat culture 7/17and 7/21 Appreciate infectious disease consultation. Continue micafungin.  - Port-A-Cath removed on 7/21 by Dr. Lucky Cowboy. Cath tip neg - Bacteremia with Klebsiella oxytoca status post 2 weeks of IV ceftaz resolved - Bacteremia with coag negative Staphylococcus, treated with vancomycin, echo negative resolved - Hemodialysis catheter placed on the 17th, urethral catheter placed 26 days ago -will do bladder training and try removed foley -  D/w dr Holley Raring. Removed HD from right groin today 01/05/15   #2 acute renal failure due to ATN-->now progressed to ESRD - Appreciate nephrology following, receiving hemodialysis - This is likely end stage renal disease, no improvement in renal function minimal urine output. He will need long-term dialysis  #3 Mantle cell lymphoma - Appreciate oncology following - Currently on Neupogen, status post 2 units packed red blood cells on 6/25 and 1 unit on 7/2 - Will need premedication prior to any further transfusions including Benadryl and Tylenol -  Subsequent pancytopenia - Prognosis is poor. The plan had been to continue chemotherapy with hopes of eventual stem cell transplant. At this point he is not eligible for continued treatment. Dr. Mike Gip feels  that his Mantle cell lymphoma has been very aggressive and will likely recur.  #4 chronic ileus with GI bleeding: Clinically improving. No further bleeding. NG tube and rectal tube discontinued 7/12 - Continue PPI, high risk for further bleeding due to thrombocytopenia - Last seen by GI on July 4. No further plans for endoscopy. - Persistent ileus seen on KUB 7/21, is passing watery stools. We'll reconsult gastroenterology as the ileus may be contributing to his lack of appetite and low oral intake. He may need an NG tube in place. Not at this point be a surgical candidate.  #5 deconditioning: Unable to participate in physical therapy due to femoral hemodialysis catheter. Working on alternative placement for hemodialysis access that would allow him to participate in physical therapy and progress towards discharge.  #6 protein calorie malnutrition: Continue Marinol. Encourage intake. Discussed with family and the patient they would not be interested in PEG tube placement.   #7 encephalopathy: Appreciate neurology consultation. This is likely encephalopathy/delirium due to critical illness. Agree with when necessary Geodon for agitation. Has not been agitated -pt is improving with his mood slowly  #8 pressure ulcers: Wound care consultation appreciated  CODE STATUS: DO NOT RESUSCITATE  Case discussed with Care Management/Social Worker. Management plans discussed with the patient, family and they are in agreement.  TOTAL TIME TAKING CARE OF THIS PATIENT: 25 minutes.  >50% time spent on counselling and coordination of care  Rayder Sullenger M.D on 01/05/2015 at 10:54 AM  Between 7am to 6pm - Pager - [phone]  After 6pm go to [email or web address] - password [email or web address] [facility]  [facility]  Office  [phone]  CC: Primary care physician; No PCP Per Patient

## 2015-01-05 NOTE — Progress Notes (Signed)
*  PRELIMINARY RESULTS* Echocardiogram 2D Echocardiogram has been performed. This was a Limited Echo with Doppler to assess for endocarditis, a recent complete Echocardiogram was performed on 12/08/14.  Caleb Francis 01/05/2015, 2:43 PM

## 2015-01-05 NOTE — Progress Notes (Addendum)
ANTICOAGULATION CONSULT NOTE - Initial Consult  Pharmacy Consult for Warfarin Indication: DVT  No Known Allergies  Patient Measurements: Height: 6' (182.9 cm) Weight: 157 lb 4.8 oz (71.351 kg) IBW/kg (Calculated) : 77.6  Vital Signs: Temp: 97.6 F (36.4 C) (07/24 1132) Temp Source: Oral (07/24 0510) BP: 86/48 mmHg (07/24 1132) Pulse Rate: 99 (07/24 1132)  Labs:  Recent Labs  01/03/15 0444 01/04/15 0418 01/05/15 1300  HGB 7.0* 7.1*  --   HCT 21.2* 21.9*  --   PLT 88* 122*  --   LABPROT  --   --  15.9*  INR  --   --  1.25  CREATININE 4.75* 5.61*  --     Estimated Creatinine Clearance: 12.9 mL/min (by C-G formula based on Cr of 5.61).   Medical History: Past Medical History  Diagnosis Date  . Arthritis   . Hypertension   . RA (rheumatoid arthritis)   . Anemia   . Mantle cell lymphoma     Dr Mike Gip  . History of nuclear stress test     a. 12/2013: low risk, no sig ischemia, no EKG changes, no artifact, EF 63%  . Chronic kidney disease   . Sepsis     Dr Ola Spurr     Goal of Therapy:  INR 2-3    Plan:  MD called pharmacy to request consult for either Eliquis or Coumadin for DVT treatment in this dialysis pt.  Ruled out Eliquis since it is not recommend for use in dialysis patients.  Ordered stat INR.  Pt currently ordered Heparin 5000 units q8h.  Due to age and multiple comorbidities, started patient with warfarin 5 mg to be given at 18:00 today.  Ordered next INR for tomorrow AM.  Pharmacy will D/C heparin once INR is in desired therapeutic range.  Pharmacy will continue to follow.  Spero Gunnels,Natha S 01/05/2015,2:29 PM   Spoke with MD.  MD switching pt to Eliquis 2.5mg  BID and requested warfarin being discontinued at this time.

## 2015-01-05 NOTE — Progress Notes (Signed)
Dr. Posey Pronto notified of positive LUE [place] via telephone.  Acknowledged, awaiting new orders.

## 2015-01-05 NOTE — Progress Notes (Addendum)
spoke with dr Delana Meyer about Souerficial thrombosis in the left arm. rrecommends for now cont eliquis 2.5 mg bid and repeat Doppler in 3-4 days to ensure the thrombus is not extending any further. No active bleeding at present and pt has anemia of CKD

## 2015-01-06 DIAGNOSIS — B49 Unspecified mycosis: Secondary | ICD-10-CM

## 2015-01-06 LAB — GLUCOSE, CAPILLARY
GLUCOSE-CAPILLARY: 92 mg/dL (ref 65–99)
GLUCOSE-CAPILLARY: 137 mg/dL — AB (ref 65–99)
Glucose-Capillary: 105 mg/dL — ABNORMAL HIGH (ref 65–99)
Glucose-Capillary: 131 mg/dL — ABNORMAL HIGH (ref 65–99)
Glucose-Capillary: 121 mg/dL — ABNORMAL HIGH (ref 65–99)

## 2015-01-06 LAB — POTASSIUM: Potassium: 2.2 mmol/L — CL (ref 3.5–5.1)

## 2015-01-06 MED ORDER — POTASSIUM CHLORIDE 10 MEQ/100ML IV SOLN
10.0000 meq | INTRAVENOUS | Status: AC
  Administered 2015-01-06 (×6): 10 meq via INTRAVENOUS
  Filled 2015-01-06 (×6): qty 100

## 2015-01-06 NOTE — Plan of Care (Signed)
Problem: Phase III Progression Outcomes Goal: IV/normal saline lock discontinued Outcome: Not Progressing Pt is alert and oriented with forgetfullness, denies pain, bedridden, physical therapy sat patient up to edge of bed, on room air, remains hypotensive, SR on tele, poor appetite, followed closely by dietary, refusing nutritional supplements, son assisting patient with dinner tray, rotated q4 hours between clamping/ unclamping foley for bladder training, wound care followed up with stage 2 on coccyx , foley likely to be d/c on 7/26 per MD, 2 loose bm throughout shift.

## 2015-01-06 NOTE — Progress Notes (Signed)
Notify Dr. Posey Pronto that postassium serum is 2.2, MD acknowledged, pharmacy will order replacement.

## 2015-01-06 NOTE — Consult Note (Signed)
GI Inpatient Consult Note  Reason for Consult:   Attending Requesting Consult:  History of Present Illness: Caleb Francis is a 69 y.o. male with chronic ileus, poor oral intake, fungemia, renal failure, and lymphoma. KUB done on 7/21 shows ileus. According to pt, appetite slowly improving. PO intake slowly improving though still poor. No abdominal pain. Denies diarrhea, though nurse mentioned it yesterday.  No interest in PEG placement if intake remains poor.  Past Medical History:  Past Medical History  Diagnosis Date  . Arthritis   . Hypertension   . RA (rheumatoid arthritis)   . Anemia   . Mantle cell lymphoma     Dr Mike Gip  . History of nuclear stress test     a. 12/2013: low risk, no sig ischemia, no EKG changes, no artifact, EF 63%  . Chronic kidney disease   . Sepsis     Dr Ola Spurr    Problem List: Patient Active Problem List   Diagnosis Date Noted  . Fungemia 12/29/2014  . Acute renal insufficiency 12/29/2014  . Pressure ulcer 12/28/2014  . Pseudo-obstruction of colon   . Ogilvie's syndrome   . Protein-calorie malnutrition, severe 12/12/2014  . Ileus   . Fecal impaction   . Metabolic encephalopathy   . Altered mental status 11/29/2014  . Chest pain 11/29/2014  . Absolute anemia 11/27/2014  . BP (high blood pressure) 11/27/2014  . Lymphoma 11/27/2014  . Thrombocytopenia   . Sepsis 11/09/2014  . Abdominal pain 11/09/2014  . Hydronephrosis 11/09/2014  . Hypokalemia 10/31/2014  . Neoplasm related pain 10/31/2014  . Sinus tachycardia 10/31/2014  . Abdominal distention   . Hydronephrosis of left kidney 10/24/2014  . Mantle cell lymphoma 10/24/2014  . CAFL (chronic airflow limitation) 07/26/2014  . Rheumatoid arthritis with rheumatoid factor 01/01/2014    Past Surgical History: Past Surgical History  Procedure Laterality Date  . Back surgery    . Fracture surgery      ankle  . Portacath placement    . Cystoscopy w/ ureteral stent placement Left  10/24/2014    Procedure: CYSTOSCOPY WITH RETROGRADE PYELOGRAM/URETERAL STENT PLACEMENT;  Surgeon: Irine Seal, MD;  Location: [facility];  Service: Urology;  Laterality: Left;  . Peripheral vascular catheterization N/A 01/02/2015    Procedure: Porta Cath Removal;  Surgeon: Algernon Huxley, MD;  Location: [facility];  Service: Cardiovascular;  Laterality: N/A;    Allergies: No Known Allergies  Home Medications: Prescriptions prior to admission  Medication Sig Dispense Refill Last Dose  . amLODipine (NORVASC) 5 MG tablet Take 5 mg by mouth daily.   unknown at unknown  . docusate sodium (COLACE) 100 MG capsule Take 1 capsule (100 mg total) by mouth 2 (two) times daily. 10 capsule 0 unknown at unknown  . folic acid (FOLVITE) 1 MG tablet Take 1 mg by mouth daily.   unknown at unknown  . hydroxychloroquine (PLAQUENIL) 200 MG tablet Take 400 mg by mouth daily.   unknown at unknown  . magnesium oxide (MAG-OX) 400 (241.3 MG) MG tablet Take 1 tablet (400 mg total) by mouth 2 (two) times daily. 30 tablet 1 unknown at unknown  . meloxicam (MOBIC) 7.5 MG tablet Take 7.5 mg by mouth daily.   unknown at unknown  . metoprolol tartrate (LOPRESSOR) 25 MG tablet Take 1 tablet (25 mg total) by mouth 2 (two) times daily. 60 tablet 0 unknown at unknown  . oxybutynin (DITROPAN) 5 MG tablet Take 1 tablet (5 mg total) by mouth every  8 (eight) hours as needed for bladder spasms (spasms or stent related pain). (Patient taking differently: Take 5 mg by mouth every 8 (eight) hours as needed for bladder spasms (or stent related pain). ) 30 tablet 0 PRN at PRN  . oxyCODONE-acetaminophen (PERCOCET/ROXICET) 5-325 MG per tablet Take 1-2 tablets by mouth every 4 (four) hours as needed for moderate pain. (Patient taking differently: Take 1-2 tablets by mouth every 6 (six) hours as needed for moderate pain. ) 30 tablet 0 unknown at unknown  . potassium chloride (K-DUR) 10 MEQ tablet Take 4 tablets (40 mEq total) by mouth 2 (two)  times daily. 20 tablet 0 unknown at unknown  . predniSONE (DELTASONE) 5 MG tablet Take 5 mg by mouth daily.   unknown at unknown  . senna-docusate (SENOKOT-S) 8.6-50 MG per tablet Take 2 tablets by mouth 2 (two) times daily. (Patient taking differently: Take 2 tablets by mouth 2 (two) times daily as needed for mild constipation. )   PRN at PRN  . spironolactone (ALDACTONE) 25 MG tablet Take 25 mg by mouth daily.   unknown at unknown  . acetaminophen (TYLENOL) 325 MG tablet Take 2 tablets (650 mg total) by mouth every 6 (six) hours as needed for mild pain or fever. (Patient not taking: Reported on 11/29/2014) 30 tablet 0 Taking  . allopurinol (ZYLOPRIM) 300 MG tablet Take 1 tablet (300 mg total) by mouth daily. (Patient not taking: Reported on 11/29/2014) 30 tablet 2 Taking  . alum & mag hydroxide-simeth (MAALOX/MYLANTA) 200-200-20 MG/5ML suspension Take 30 mLs by mouth every 6 (six) hours as needed for indigestion or heartburn. (Patient not taking: Reported on 11/29/2014) 355 mL 0 Taking  . antiseptic oral rinse (CPC / CETYLPYRIDINIUM CHLORIDE 0.05%) 0.05 % LIQD solution 7 mLs by Mouth Rinse route 2 times daily at 12 noon and 4 pm. (Patient not taking: Reported on 11/29/2014) 50 mL 0 Taking  . feeding supplement, ENSURE ENLIVE, (ENSURE ENLIVE) LIQD Take 237 mLs by mouth 2 (two) times daily between meals. (Patient not taking: Reported on 11/29/2014) 237 mL 12 Taking   Home medication reconciliation was completed with the patient.   Scheduled Inpatient Medications:   . amiodarone  200 mg Oral Daily  . antiseptic oral rinse  7 mL Mouth Rinse BID  . apixaban  2.5 mg Oral BID  . carvedilol  20 mg Oral Daily  . Chlorhexidine Gluconate Cloth  6 each Topical Once  . dronabinol  2.5 mg Oral BID AC  . epoetin (EPOGEN/PROCRIT) injection  14,000 Units Subcutaneous Q T,Th,Sa-HD  . feeding supplement (NEPRO CARB STEADY)  237 mL Oral BID BM  . insulin aspart  0-9 Units Subcutaneous Q6H  . micafungin (MYCAMINE) IV   100 mg Intravenous Daily  . mometasone-formoterol  2 puff Inhalation BID  . pantoprazole (PROTONIX) IV  40 mg Intravenous Q12H  . potassium chloride  10 mEq Intravenous Q1 Hr x 6  . scopolamine  1 patch Transdermal Q72H    Continuous Inpatient Infusions:     PRN Inpatient Medications:  acetaminophen **OR** acetaminophen, albuterol, haloperidol lactate, lidocaine (PF), lidocaine-prilocaine, magnesium hydroxide, metoprolol, ondansetron (ZOFRAN) IV, promethazine, sodium chloride, ziprasidone  Family History: family history includes Cancer in his father and mother. There is no history of Colon cancer or Liver disease.  The patient's family history is negative for inflammatory bowel disorders, GI malignancy, or solid organ transplantation.  Social History:   reports that he quit smoking about 2 months ago. His smoking use included Cigarettes. He  has a 30 pack-year smoking history. He does not have any smokeless tobacco history on file. He reports that he does not drink alcohol or use illicit drugs. The patient denies ETOH, tobacco, or drug use.   Review of Systems: Constitutional: Weight is stable.  Eyes: No changes in vision. ENT: No oral lesions, sore throat.  GI: see HPI.  Heme/Lymph: No easy bruising.  CV: No chest pain.  GU: No hematuria.  Integumentary: No rashes.  Neuro: No headaches.  Psych: No depression/anxiety.  Endocrine: No heat/cold intolerance.  Allergic/Immunologic: No urticaria.  Resp: No cough, SOB.  Musculoskeletal: No joint swelling.    Physical Examination: BP 92/49 mmHg  Pulse 97  Temp(Src) 98.1 F (36.7 C) (Oral)  Resp 17  Ht 6' (1.829 m)  Wt 71.079 kg (156 lb 11.2 oz)  BMI 21.25 kg/m2  SpO2 100% Gen: NAD, alert and oriented x 4 HEENT: PEERLA, EOMI, Neck: supple, no JVD or thyromegaly Chest: CTA bilaterally, no wheezes, crackles, or other adventitious sounds CV: RRR, no m/g/c/r Abd: soft, NT,mild distension, +BS in all four quadrants; no HSM,  guarding, ridigity, or rebound tenderness Ext: no edema, well perfused with 2+ pulses, Skin: no rash or lesions noted Lymph: no LAD  Data: Lab Results  Component Value Date   WBC 17.5* 01/04/2015   HGB 7.1* 01/04/2015   HCT 21.9* 01/04/2015   MCV 86.1 01/04/2015   PLT 122* 01/04/2015    Recent Labs Lab 01/02/15 0453 01/03/15 0444 01/04/15 0418  HGB 7.4* 7.0* 7.1*   Lab Results  Component Value Date   NA 134* 01/04/2015   K 2.2* 01/06/2015   CL 97* 01/04/2015   CO2 23 01/04/2015   BUN 45* 01/04/2015   CREATININE 5.61* 01/04/2015   Lab Results  Component Value Date   ALT 23 12/21/2014   AST 26 12/21/2014   ALKPHOS 216* 12/21/2014   BILITOT 0.8 12/21/2014    Recent Labs Lab 01/05/15 1300  INR 1.25   Assessment/Plan: Mr. Degidio is a 69 y.o. male with failure to thrive. See my previous notes. Ileus is unlikely the cause of his poor oral intake. He has many other reasons for poor appetite.   Recommendations: Continue to encourage food. May need supplements, such as instant carnation breakfast, ensure, sustacal, etc. Can periodically check KUB to assess ileus. Otherwise, there is nothing more to add from GI.  Thank you for the consult. Please call with questions or concerns.  Amanda Steuart, Lupita Dawn, MD

## 2015-01-06 NOTE — Progress Notes (Signed)
Pharmacy Note - Potassium Supplementation  Orders to check and replace potassium per Dr. Posey Pronto  Potassium 7/25 @ 13:00 = 2.2  Ordered KCl 67mEq IV x 6, will recheck potassium this evening and supplement if needed.  Rexene Edison, PharmD Clinical Pharmacist

## 2015-01-06 NOTE — Progress Notes (Signed)
Brief Nutrition Note:   Follow-up with regards to calorie count; calorie count not completed on Saturday and Sunday. Some documentation in I/O flow sheet but actual calorie count not completed. Unable to accurately calculate kcals/protein over the weekend based on documentation.  On visit this AM, pt alert and communicative. Pt verbalizes that he does not want anything solid to eat (offered up many options including pancakes, bacon, biscuits, grits, etc). Pt only took orange juice and sips of mighty shakes.  Per I/O flow sheet, yesterday recorded po intake 20% at breakfast, nothing documented for lunch and pt ate 75% of mashed potatoes and pinto beans that family brought in last night (of note, when Probation officer asked pt this AM, if family brought in any food for him over the weekend, pt again replied, "if they did, I did not see it" but nursing staff reporting that family brought this in for patient last night). Pt report regarding po intake does not appear to be reliable  based on this observation. On Saturday, recorded po intake 30% at breakfast, sips of Nepro, sips of Mighty Shake at Lunch and 40% of dinner. No family present on visit today.   Pt continues to demonstrate no interest in eating. Appears to continue with inadequate po intake, minimal solid food intake, taking some liquids.  Discussed with MD Posey Pronto; MD to discuss further poc with family today and will let writer know if calorie count to continue.  Kerman Passey [place], [place], LDN [phone] Pager

## 2015-01-06 NOTE — Progress Notes (Signed)
Physical Therapy Treatment Patient Details Name: Caleb Francis MRN: [id number] DOB: 09-26-1945 Today's Date: 01/06/2015    History of Present Illness Pt is a 69 y.o. 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  Temporary R dialysis catheter placed in R groin 12/17/14 (removed and replaced 7/17 d/t infection/fevers) and currently receiving dialysis.  Pt was in ICU but currently on floor.  Pt with recent admiission 11/08/14-11/19/14 for sepsis and AMS d/t UTI.  Pt went to [facility] prior to re-admission. Temp HD cath removed 01/05/15. Will place perm cath in next few days, per chart.    PT Comments    Pt had temp HD catheter removed today, therefore he was able to engage in mobility with PT. His bed mobility requires mod assist for trunk support and LE management, secondary to weakness and deconditioning. Pt was willing to participate with therapy this date. He will continue to benefit from skilled PT in order address these deficits and return to premorbid state.   Follow Up Recommendations  SNF     Equipment Recommendations  Other (comment) (TBD)    Recommendations for Other Services       Precautions / Restrictions Precautions Precautions: Fall Restrictions Weight Bearing Restrictions: No    Mobility  Bed Mobility Overal bed mobility: Needs Assistance Bed Mobility: Supine to Sit     Supine to sit: Mod assist;+2 for physical assistance     General bed mobility comments: Pt performed bed mobility with mod assist +2 for LE management and trunk control. Avoided excessive use of L arm due to thrombosis.   Transfers Overall transfer level:  (Not attempted due to weakness and hypotension. )               General transfer comment: In supine, pt's blood pressure  was 87/49 and once in sitting his BP was 89/67. After one minute of sitting pt complained  of slight dizziness. When returned to supine, his BP was 88/55 and he was non-symptomatic   Ambulation/Gait Ambulation/Gait assistance:  (Not attempted due to weakness and hypotension)               Stairs            Wheelchair Mobility    Modified Rankin (Stroke Patients Only)       Balance Overall balance assessment: Needs assistance Sitting-balance support: Single extremity supported;Feet supported Sitting balance-Leahy Scale: Fair                              Cognition Arousal/Alertness: Awake/alert Behavior During Therapy: Flat affect Overall Cognitive Status: Within Functional Limits for tasks assessed                      Exercises Other Exercises Other Exercises: Temp HD cath removed this AM, therefore mobility no longer contraindicated. Pt performed bilateral LE exercise x10 with min assist for facilitation of proper movement. Pt gets distracted easily, so he requires cues to attend to task. Exercises included: ankle pumps, quad sets, heel slides, glute sets, SAQ, hip abd/add. Avoided exercise on LUE     General Comments        Pertinent Vitals/Pain Pain Assessment: No/denies pain    Home Living  Prior Function            PT Goals (current goals can now be found in the care plan section) Acute Rehab PT Goals Patient Stated Goal: to participate in therapy PT Goal Formulation: With patient Time For Goal Achievement: 01/07/15 Potential to Achieve Goals: Fair Progress towards PT goals: Progressing toward goals    Frequency  Min 2X/week    PT Plan Current plan remains appropriate    Co-evaluation             End of Session   Activity Tolerance: Other (comment) (Limited by hypotension and weakness) Patient left: in bed;with call bell/phone within reach;with bed alarm set     Time: 1115-1140 PT Time Calculation (min) (ACUTE ONLY): 25 min  Charges:                       G CodesJanyth Contes 01/13/2015, 1:26 PM  Janyth Contes, SPT. [phone]

## 2015-01-06 NOTE — Consult Note (Signed)
WOC wound follow up Reason for Consult:Stage II sacral ulcer, present on admission. Wound type:Stage II pressure ulcer Pressure Ulcer POA: Yes Measurement:1 cm x 1 cm x 0.1 cm Wound bed:100% pink and moist Drainage (amount, consistency, odor) Minimal serosanguinous drainage Periwound:Intact Dressing procedure/placement/frequency:Cleanse sacral ulcer with soap and water. Apply silicone border foam dressing per protocol. Change every 3 days and PRN soilage.

## 2015-01-06 NOTE — Progress Notes (Signed)
Pillow at [place] NAME: Caleb Francis    MR#:  [id number]  DATE OF BIRTH:  [birth year]  SUBJECTIVE:  Trying to eat. Very weak. Not much po intake other than few sips of mighty shakes  REVIEW OF SYSTEMS:   Review of Systems  Constitutional: Positive for malaise/fatigue. Negative for fever, chills and weight loss.  HENT: Negative for ear discharge, ear pain and nosebleeds.   Eyes: Negative for blurred vision, pain and discharge.  Respiratory: Negative for sputum production, shortness of breath, wheezing and stridor.   Cardiovascular: Negative for chest pain, palpitations, orthopnea and PND.  Gastrointestinal: Negative for nausea, vomiting, abdominal pain and diarrhea.  Genitourinary: Negative for urgency and frequency.  Musculoskeletal: Negative for back pain and joint pain.  Neurological: Positive for weakness. Negative for sensory change, speech change and focal weakness.  Psychiatric/Behavioral: Negative for depression. The patient is not nervous/anxious.    Tolerating Diet:very little Tolerating PT: eval pending   DRUG ALLERGIES:  No Known Allergies  VITALS:  Blood pressure 92/49, pulse 97, temperature 98.1 F (36.7 C), temperature source Oral, resp. rate 17, height 6' (1.829 m), weight 71.079 kg (156 lb 11.2 oz), SpO2 100 %.  PHYSICAL EXAMINATION:   Physical Exam  GENERAL:  69 y.o.-year-old patient lying in the bed with no acute distress. Appears chronically ill EYES: Pupils equal, round, reactive to light and accommodation. No scleral icterus. Extraocular muscles intact.  HEENT: Head atraumatic, normocephalic. Oropharynx and nasopharynx clear.  NECK:  Supple, no jugular venous distention. No thyroid enlargement, no tenderness.  LUNGS: Normal breath sounds bilaterally, no wheezing, rales, rhonchi. No use of accessory muscles of respiration.  CARDIOVASCULAR: S1, S2 normal. No murmurs, rubs, or gallops.  ABDOMEN: Soft,  nontender, nondistended. Bowel sounds present. No organomegaly or mass.  EXTREMITIES: No cyanosis, clubbing or edema b/l.   Right groin catheter Left arm swelling + NEUROLOGIC: Cranial nerves II through XII are intact. No focal Motor or sensory deficits b/l.  weak PSYCHIATRIC: The patient is alert and oriented x 3.  SKIN: stage II sacral ulcer.    LABORATORY PANEL:   CBC  Recent Labs Lab 01/04/15 0418  WBC 17.5*  HGB 7.1*  HCT 21.9*  PLT 122*    Chemistries   Recent Labs Lab 01/04/15 0418 01/06/15 1307  NA 134*  --   K 3.2* 2.2*  CL 97*  --   CO2 23  --   GLUCOSE 118*  --   BUN 45*  --   CREATININE 5.61*  --   CALCIUM 7.1*  --    ASSESSMENT AND PLAN:   #1 sepsis: - Fungemia with Candida parapsilosis blood cultures 7/15, and again on repeat culture 7/17and 7/21 Appreciate infectious disease consultation. Continue micafungin.  - Port-A-Cath removed on 7/21 by Dr. Lucky Cowboy. Cath tip neg - Bacteremia with Klebsiella oxytoca status post 2 weeks of IV ceftaz resolved - Bacteremia with coag negative Staphylococcus, treated with vancomycin, echo negative resolved - Hemodialysis catheter placed on the 17th, urethral catheter placed 26 days ago -will do bladder training and try removed foley -  D/w dr Holley Raring. Removed HD from right groin  01/05/15   #2 acute renal failure due to ATN-->now progressed to ESRD - Appreciate nephrology following, receiving hemodialysis - This is likely end stage renal disease, no improvement in renal function minimal urine output. He will need long-term dialysis  #3 Mantle cell lymphoma - Appreciate oncology following - Currently on Neupogen, status post  2 units packed red blood cells on 6/25 and 1 unit on 7/2 - Will need premedication prior to any further transfusions including Benadryl and Tylenol - Subsequent pancytopenia - Prognosis is poor. The plan had been to continue chemotherapy with hopes of eventual stem cell transplant. At this point he  is not eligible for continued treatment. Dr. Mike Gip feels that his Mantle cell lymphoma has been very aggressive and will likely recur.  #4 chronic ileus with GI bleeding: Clinically improving. No further bleeding. NG tube and rectal tube discontinued 7/12 - Continue PPI, high risk for further bleeding due to thrombocytopenia - Last seen by GI on July 4. No further plans for endoscopy. - Persistent ileus seen on KUB 7/21, is passing watery stools. We'll reconsult gastroenterology as the ileus may be contributing to his lack of appetite and low oral intake. He may need an NG tube in place. Not at this point be a surgical candidate.  #5 deconditioning: Unable to participate in physical therapy due to femoral hemodialysis catheter. Working on alternative placement for hemodialysis access that would allow him to participate in physical therapy and progress towards discharge.  #6 protein calorie malnutrition: Continue Marinol. Encourage intake. Discussed with family and the patient they would not be interested in PEG tube placement.   #7 encephalopathy: Appreciate neurology consultation. This is likely encephalopathy/delirium due to critical illness. Agree with when necessary Geodon for agitation. Has not been agitated -pt is improving with his mood slowly  #8 pressure ulcers: Wound care consultation appreciated  #9 left basilic vein superficial extensive thrombosis Spoke with dr Delana Meyer and rec give eliquis 2.5 mg bid and repeat US upper extremity in 3-4 days to ensure thrombus not extending further  CODE STATUS: DO NOT RESUSCITATE  Case discussed with Care Management/Social Worker. Management plans discussed with the patient, family and they are in agreement.  TOTAL TIME TAKING CARE OF THIS PATIENT: 25 minutes.  >50% time spent on counselling and coordination of care  Clementine Soulliere M.D on 01/06/2015 at 3:45 PM  Between 7am to 6pm - Pager - [phone]  After 6pm go to [email or web address] -  password [email or web address] [facility]  [facility]  Office  [phone]  CC: Primary care physician; No PCP Per Patient

## 2015-01-06 NOTE — Progress Notes (Signed)
Subjective:  Patient awake and alert.  Last hemodialysis treatment on Saturday.  Currently getting bladder training with foley catheter.  UOP 900 Blood cultures from 7/21 showing yeast. HD catheter removed.  Found to have superficial thrombosis. Started on apixaban  Objective:  Vital signs in last 24 hours:  Temp:  [97.6 F (36.4 C)-98.7 F (37.1 C)] 98.2 F (36.8 C) (07/25 0431) Pulse Rate:  [98-101] 101 (07/25 0431) Resp:  [17-20] 20 (07/25 0431) BP: (86-105)/(48-57) 104/51 mmHg (07/25 0431) SpO2:  [95 %-100 %] 100 % (07/25 0431) Weight:  [71.079 kg (156 lb 11.2 oz)] 71.079 kg (156 lb 11.2 oz) (07/25 0431)  Weight change: -3.121 kg (-6 lb 14.1 oz) Filed Weights   01/04/15 1536 01/05/15 0510 01/06/15 0431  Weight: 74.072 kg (163 lb 4.8 oz) 71.351 kg (157 lb 4.8 oz) 71.079 kg (156 lb 11.2 oz)    Intake/Output: I/O last 3 completed shifts: In: 630 [P.O.:630] Out: 1100 [Urine:1100]   Intake/Output this shift:  Total I/O In: 360 [P.O.:360] Out: -   Physical Exam: General: chronically ill appearing  Head: oral mucosa moist  Eyes: Anicteric  Neck: Supple, trachea midline  Lungs:  Clear to auscultation normal effort  Heart: S1S2 no rubs  Abdomen:  Soft, NTND, BS present  Extremities: No LE edema  Neurologic: Awake, alert, oriented to self and place, follows commands  Skin: No acute rashes  GU foley  Access: none    Basic Metabolic Panel:  Recent Labs Lab 12/31/14 0455 01/01/15 0453 01/02/15 0453 01/02/15 1303 01/03/15 0444 01/04/15 0418  NA 137 139 136  --  140 134*  K 3.1* 3.0* 2.8*  --  2.9* 3.2*  CL 96* 100* 100*  --  100* 97*  CO2 25 26 26   --  28 23  GLUCOSE 101* 112* 98  --  105* 118*  BUN 66* 38* 49*  --  35* 45*  CREATININE 5.98* 4.31* 5.48*  --  4.75* 5.61*  CALCIUM 7.3* 7.4* 6.9*  --  7.4* 7.1*  PHOS  --   --   --  2.3*  --   --     Liver Function Tests: No results for input(s): AST, ALT, ALKPHOS, BILITOT, PROT, ALBUMIN in the last 168  hours. No results for input(s): LIPASE, AMYLASE in the last 168 hours. No results for input(s): AMMONIA in the last 168 hours.  CBC:  Recent Labs Lab 12/31/14 0455 01/01/15 0453 01/02/15 0453 01/03/15 0444 01/04/15 0418  WBC 11.1* 11.1* 14.5* 16.2* 17.5*  NEUTROABS  --  7.9*  --   --   --   HGB 7.3* 7.3* 7.4* 7.0* 7.1*  HCT 21.8* 21.6* 22.2* 21.2* 21.9*  MCV 83.4 84.1 84.4 85.1 86.1  PLT 58* 56* 66* 88* 122*    Cardiac Enzymes: No results for input(s): CKTOTAL, CKMB, CKMBINDEX, TROPONINI in the last 168 hours.  BNP: Invalid input(s): POCBNP  CBG:  Recent Labs Lab 01/05/15 0748 01/05/15 1502 01/05/15 2009 01/06/15 0223 01/06/15 0854  GLUCAP 92 105* 144* 105* 92    Microbiology: Results for orders placed or performed during the hospital encounter of 11/29/14  MRSA PCR Screening     Status: None   Collection Time: 11/29/14 12:13 PM  Result Value Ref Range Status   MRSA by PCR NEGATIVE NEGATIVE Final    Comment:        The GeneXpert MRSA Assay (FDA approved for NASAL specimens only), is one component of a comprehensive MRSA colonization surveillance program. It is  not intended to diagnose MRSA infection nor to guide or monitor treatment for MRSA infections.   Culture, blood (routine x 2)     Status: None   Collection Time: 11/29/14  1:19 PM  Result Value Ref Range Status   Specimen Description BLOOD  Final   Special Requests Immunocompromised  Final   Culture NO GROWTH 5 DAYS  Final   Report Status 12/04/2014 FINAL  Final  Culture, blood (routine x 2)     Status: None   Collection Time: 11/29/14  3:04 PM  Result Value Ref Range Status   Specimen Description Blood  Final   Special Requests Immunocompromised  Final   Report Status 01/03/2015 FINAL  Final  Urine culture     Status: None   Collection Time: 11/30/14 10:12 AM  Result Value Ref Range Status   Specimen Description URINE, CATHETERIZED  Final   Special Requests Immunocompromised  Final    Culture NO GROWTH 2 DAYS  Final   Report Status 12/02/2014 FINAL  Final  Culture, blood (routine x 2)     Status: None   Collection Time: 12/06/14  8:08 PM  Result Value Ref Range Status   Specimen Description BLOOD  Final   Special Requests NONE  Final   Culture  Setup Time   Final    GRAM POSITIVE COCCI IN BOTH AEROBIC AND ANAEROBIC BOTTLES CRITICAL RESULT CALLED TO, READ BACK BY AND VERIFIED WITH: JENNIFER BEZARD AT 8546 12/08/14.PMH CONFIRMED BY RWW    Culture   Final    STAPHYLOCOCCUS AURICULARIS IN BOTH AEROBIC AND ANAEROBIC BOTTLES    Report Status 12/11/2014 FINAL  Final   Organism ID, Bacteria STAPHYLOCOCCUS AURICULARIS  Final      Susceptibility   Staphylococcus auricularis - MIC*    CIPROFLOXACIN >=8 RESISTANT Resistant     ERYTHROMYCIN >=8 RESISTANT Resistant     GENTAMICIN <=0.5 SENSITIVE Sensitive     OXACILLIN >=4 RESISTANT Resistant     TETRACYCLINE <=1 SENSITIVE Sensitive     VANCOMYCIN 1 SENSITIVE Sensitive     CLINDAMYCIN <=0.25 SENSITIVE Sensitive     TRIMETH/SULFA Value in next row Sensitive      SENSITIVE<=20    LEVOFLOXACIN Value in next row Intermediate      INTERMEDIATE4    * STAPHYLOCOCCUS AURICULARIS  Culture, blood (routine x 2)     Status: None   Collection Time: 12/06/14  8:40 PM  Result Value Ref Range Status   Specimen Description BLOOD  Final   Special Requests NONE  Final   Culture NO GROWTH 5 DAYS  Final   Report Status 12/11/2014 FINAL  Final  Culture, blood (routine x 2)     Status: None   Collection Time: 12/08/14 11:40 AM  Result Value Ref Range Status   Specimen Description BLOOD  Final   Special Requests Normal  Final   Culture NO GROWTH 6 DAYS  Final   Report Status 12/14/2014 FINAL  Final  Culture, blood (routine x 2)     Status: None   Collection Time: 12/08/14 11:49 AM  Result Value Ref Range Status   Specimen Description BLOOD  Final   Special Requests Normal  Final   Culture NO GROWTH 6 DAYS  Final   Report Status  12/14/2014 FINAL  Final  C difficile quick scan w PCR reflex ([facility] only)     Status: None   Collection Time: 12/14/14 10:11 AM  Result Value Ref Range Status   C Diff antigen NEGATIVE  Final   C Diff toxin NEGATIVE  Final   C Diff interpretation Negative for C. difficile  Final  Culture, blood (routine x 2)     Status: None   Collection Time: 12/27/14 11:28 AM  Result Value Ref Range Status   Specimen Description BLOOD  Final   Special Requests Immunocompromised  Final   Culture  Setup Time   Final    YEAST AEROBIC BOTTLE ONLY CRITICAL RESULT CALLED TO, READ BACK BY AND VERIFIED WITH: CATHY SUMMERLIN ON 12/29/14 AT 1000AM BY JEF    Culture CANDIDA PARAPSILOSIS AEROBIC BOTTLE ONLY   Final   Report Status 01/02/2015 FINAL  Final  Culture, blood (routine x 2)     Status: None   Collection Time: 12/27/14 11:36 AM  Result Value Ref Range Status   Specimen Description BLOOD  Final   Special Requests Immunocompromised  Final   Culture  Setup Time   Final    YEAST AEROBIC BOTTLE ONLY CRITICAL RESULT CALLED TO, READ BACK BY AND VERIFIED WITH: LISA ROMERO AT 0111 ON 12/29/14 RWW CONFIRMED BY [place]    Culture CANDIDA PARAPSILOSIS AEROBIC BOTTLE ONLY   Final   Report Status 01/02/2015 FINAL  Final  Cath Tip Culture     Status: None   Collection Time: 12/27/14  4:17 PM  Result Value Ref Range Status   Specimen Description CATH TIP  Final   Special Requests NONE  Final   Culture NO GROWTH 2 DAYS  Final   Report Status 12/30/2014 FINAL  Final  Culture, blood (routine x 2)     Status: None   Collection Time: 12/29/14 12:28 PM  Result Value Ref Range Status   Specimen Description BLOOD RIGHT ARM  Final   Special Requests   Final    BOTTLES DRAWN AEROBIC AND ANAEROBIC  AER 4CC ANA 3CC   Culture  Setup Time   Final    BUDDING YEAST SEEN AEROBIC BOTTLE ONLY CRITICAL RESULT CALLED TO, READ BACK BY AND VERIFIED WITH: BROKE ROBERTSON AT 2230 01/01/15.TSH CONFIRMED BY SDR    Culture CANDIDA  PARAPSILOSIS AEROBIC BOTTLE ONLY   Final   Report Status 01/04/2015 FINAL  Final  Culture, blood (routine x 2)     Status: None   Collection Time: 12/29/14 12:28 PM  Result Value Ref Range Status   Specimen Description BLOOD RIGHT FATTY CASTS  Final   Special Requests BOTTLES DRAWN AEROBIC AND ANAEROBIC  5CC  Final   Culture  Setup Time   Final    YEAST AEROBIC BOTTLE ONLY CRITICAL VALUE NOTED.  VALUE IS CONSISTENT WITH PREVIOUSLY REPORTED AND CALLED VALUE.    Culture CANDIDA PARAPSILOSIS  Final   Report Status 01/04/2015 FINAL  Final  Cath Tip Culture     Status: None   Collection Time: 01/02/15 12:42 PM  Result Value Ref Range Status   Specimen Description CATH TIP  Final   Special Requests Normal  Final   Culture NO GROWTH 3 DAYS  Final   Report Status 01/05/2015 FINAL  Final  Culture, blood (routine x 2)     Status: None (Preliminary result)   Collection Time: 01/02/15  6:34 PM  Result Value Ref Range Status   Specimen Description BLOOD RIGHT HAND  Final   Special Requests 5 BOTTLES DRAWN AEROBIC AND ANAEROBIC 5ML  Final   Culture  Setup Time   Final    BUDDING YEAST SEEN ANAEROBIC BOTTLE ONLY CRITICAL RESULT CALLED TO, READ BACK BY AND VERIFIED  WITH: IRIS GUIDRY AT 2030 01/04/15 SDR  CONFIRMED BY RW    Culture YEAST IDENTIFICATION TO FOLLOW   Final   Report Status PENDING  Incomplete  Culture, blood (routine x 2)     Status: None (Preliminary result)   Collection Time: 01/05/15  8:59 AM  Result Value Ref Range Status   Specimen Description BLOOD RIGHT ASSIST CONTROL  Final   Special Requests BOTTLES DRAWN AEROBIC AND ANAEROBIC 1ML  Final   Culture NO GROWTH < 24 HOURS  Final   Report Status PENDING  Incomplete  Culture, blood (routine x 2)     Status: None (Preliminary result)   Collection Time: 01/05/15  9:10 AM  Result Value Ref Range Status   Specimen Description BLOOD LEFT HAND  Final   Special Requests BOTTLES DRAWN AEROBIC AND ANAEROBIC 1ML  Final   Culture  NO GROWTH < 24 HOURS  Final   Report Status PENDING  Incomplete    Coagulation Studies:  Recent Labs  01/05/15 1300  LABPROT 15.9*  INR 1.25    Urinalysis: No results for input(s): COLORURINE, LABSPEC, PHURINE, GLUCOSEU, HGBUR, BILIRUBINUR, KETONESUR, PROTEINUR, UROBILINOGEN, NITRITE, LEUKOCYTESUR in the last 72 hours.  Invalid input(s): APPERANCEUR    Imaging: US Venous Img Upper Uni Left  01/05/2015   CLINICAL DATA:  Left upper extremity edema and history of recent left arm IV access. History of lymphoma.  EXAM: LEFT UPPER EXTREMITY VENOUS DOPPLER ULTRASOUND  TECHNIQUE: Gray-scale sonography with graded compression, as well as color Doppler and duplex ultrasound were performed to evaluate the upper extremity deep venous system from the level of the subclavian vein and including the jugular, axillary, basilic, radial, ulnar and upper cephalic vein. Spectral Doppler was utilized to evaluate flow at rest and with distal augmentation maneuvers.  COMPARISON:  None.  FINDINGS: Contralateral Subclavian Vein: Respiratory phasicity is normal and symmetric with the symptomatic side. No evidence of thrombus. Normal compressibility.  Internal Jugular Vein: No evidence of thrombus. Normal compressibility, respiratory phasicity and response to augmentation.  Subclavian Vein: No evidence of thrombus. Normal compressibility, respiratory phasicity and response to augmentation.  Axillary Vein: No evidence of thrombus. Normal compressibility, respiratory phasicity and response to augmentation.  Cephalic Vein: No evidence of thrombus. Normal compressibility, respiratory phasicity and response to augmentation.  Basilic Vein: Extensive occlusive thrombus is identified throughout most of the visualized basilic vein in the upper arm. The vein is noncompressible.  Brachial Veins: No evidence of thrombus. Normal compressibility, respiratory phasicity and response to augmentation.  Radial Veins: No evidence of thrombus.  Normal compressibility, respiratory phasicity and response to augmentation.  Ulnar Veins: No evidence of thrombus. Normal compressibility, respiratory phasicity and response to augmentation.  Venous Reflux:  None visualized.  Other Findings: Abnormal soft tissue structure is identified in the supraclavicular region measuring roughly 3.1 x 1.5 x 3.1 cm. This has the appearance of an enlarged pathologic lymph node.  IMPRESSION: 1. Superficial thrombophlebitis with extensive thrombus in the left basilic vein of the upper arm which would be considered a superficial vein. 2. No true deep vein thrombosis in the left upper extremity. 3. Abnormal soft tissue mass in the supraclavicular region is likely a pathologic lymph node related to the known diagnosis of lymphoma.   Electronically Signed   By: Aletta Edouard M.D.   On: 01/05/2015 11:17     Medications:     . amiodarone  200 mg Oral Daily  . antiseptic oral rinse  7 mL Mouth Rinse BID  .  apixaban  2.5 mg Oral BID  . carvedilol  20 mg Oral Daily  . Chlorhexidine Gluconate Cloth  6 each Topical Once  . dronabinol  2.5 mg Oral BID AC  . epoetin (EPOGEN/PROCRIT) injection  14,000 Units Subcutaneous Q T,Th,Sa-HD  . feeding supplement (NEPRO CARB STEADY)  237 mL Oral BID BM  . insulin aspart  0-9 Units Subcutaneous Q6H  . micafungin (MYCAMINE) IV  100 mg Intravenous Daily  . mometasone-formoterol  2 puff Inhalation BID  . pantoprazole (PROTONIX) IV  40 mg Intravenous Q12H  . scopolamine  1 patch Transdermal Q72H   acetaminophen **OR** acetaminophen, albuterol, haloperidol lactate, lidocaine (PF), lidocaine-prilocaine, magnesium hydroxide, metoprolol, ondansetron (ZOFRAN) IV, promethazine, sodium chloride, ziprasidone  Assessment/ Plan:  Mr. Lange is a 43 black male with progressive mantle cell lymphoma, RICE chemo in 11/2014, hx left sided hydronephrosis and hydroureter. S/p ureteral stent placement   Hospital course complicated by sepsis with  Klebsiella oxytoca, hypernatremia. Now with fungemia.   1.  Acute renal failure due to ATN N17.0: Pt has undergone multiple dialysis sessions.  Has temporary dialysis catheter removed due to fungemia and now without access.  Urine output is nonoliguric. No new labs.  Last HD treatment on 7/23 - Check renal function panel. Continue to monitor renal function, urine output and volume status. - temp catheter to be placed when dialysis needed. Currently want to keep patient catheter free for 3-4 days.   2.  Sepsis with fungemia. A41.9, B49. Status post bacteremia with klebsiella oxytoca: completed two weeks for IV ceftazidime. Subsequently growing candida parapsilosis.  Subcutaneous port removed 01/02/15. - blood cultures from 7/21 now showing yeast despite micafungin.  7/24 culture pending.  - Appreciate ID input.  Currently on micafungin.   3.  Anemia of chronic kidney disease/mantle cell lymphoma:  continue epogen 14000 units IV with HD, use OK'd by hematology/onc. Currently on hold since no HD.   4. Secondary Hyperparathyroidism: PTh 126. Phos 2.3.    LOS: [place] 7/25/201610:38 AM

## 2015-01-06 NOTE — Progress Notes (Signed)
ID E note:  Fungemia with Candida parapsilosis, history of lymphoma, ESRD, port a cath, removed 7/21.   Repeat blood culture 7/21 1/1 with yeast again, HD catheter removed.  Repeat blood cultures 7/24 NGTD.   On micafungin.  Recommend: TEE due to persistently positive blood culture.   [place], [place]

## 2015-01-06 NOTE — Care Management Important Message (Signed)
Important Message  Patient Details  Name: Caleb Francis MRN: [id number] Date of Birth: Oct 11, 1945   Medicare Important Message Given:  Yes-fourth notification given    [name] 01/06/2015, 11:54 AM

## 2015-01-06 NOTE — Progress Notes (Signed)
Physical Therapy Treatment Patient Details Name: Caleb Francis MRN: [id number] DOB: 12-27-1945 Today's Date: 01/06/2015    History of Present Illness Pt is a 69 y.o. 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  Temporary R dialysis catheter placed in R groin 12/17/14 (removed and replaced 7/17 d/t infection/fevers) and currently receiving dialysis.  Pt was in ICU but currently on floor.  Pt with recent admiission 11/08/14-11/19/14 for sepsis and AMS d/t UTI.  Pt went to [facility] prior to re-admission. Temp HD cath removed 01/05/15. Will place perm cath in next few days, per chart.    PT Comments      Pt had temp HD catheter removed yesterday (01/05/15), therefore he was able to engage in mobility with PT. It was also found through chart review that the pt had a thrombosis of the RUE which was being monitored, and it was cleared by nursing to mobilize pt. RUE activities were limited.  His bed mobility requires mod assist for trunk support and LE management, secondary to weakness and deconditioning. Pt was willing to participate with therapy this date. He will continue to benefit from skilled PT in order address these deficits and return to premorbid state.          Follow Up Recommendations  SNF     Equipment Recommendations  Other (comment) (TBD)    Recommendations for Other Services       Precautions / Restrictions Precautions Precautions: Fall Restrictions Weight Bearing Restrictions: No    Mobility  Bed Mobility Overal bed mobility: Needs Assistance Bed Mobility: Supine to Sit     Supine to sit: Mod assist;+2 for physical assistance     General bed mobility comments: Pt performed bed mobility with mod assist +2 for LE management and trunk control. Avoided excessive use of L arm due to thrombosis.   Transfers Overall transfer level:  (Not  attempted due to weakness and hypotension. )               General transfer comment: In supine, pt's blood pressure  was 87/49 and once in sitting his BP was 89/67. After one minute of sitting pt complained of slight dizziness. When returned to supine, his BP was 88/55 and he was non-symptomatic   Ambulation/Gait Ambulation/Gait assistance:  (Not attempted due to weakness and hypotension)               Stairs            Wheelchair Mobility    Modified Rankin (Stroke Patients Only)       Balance Overall balance assessment: Needs assistance Sitting-balance support: Single extremity supported;Feet supported Sitting balance-Leahy Scale: Fair                              Cognition Arousal/Alertness: Awake/alert Behavior During Therapy: Flat affect Overall Cognitive Status: Within Functional Limits for tasks assessed                      Exercises Other Exercises Other Exercises: Temp HD cath removed this AM, therefore mobility no longer contraindicated. Pt performed bilateral LE exercise x10 with min assist for facilitation of proper movement. Pt gets distracted easily, so he requires cues to attend to task. Exercises included: ankle pumps, quad sets, heel slides, glute sets, SAQ, hip abd/add. Avoided exercise on  LUE     General Comments        Pertinent Vitals/Pain Pain Assessment: No/denies pain    Home Living                      Prior Function            PT Goals (current goals can now be found in the care plan section) Acute Rehab PT Goals Patient Stated Goal: to participate in therapy PT Goal Formulation: With patient Time For Goal Achievement: 01/07/15 Potential to Achieve Goals: Fair Progress towards PT goals: Progressing toward goals    Frequency  Min 2X/week    PT Plan Current plan remains appropriate    Co-evaluation             End of Session   Activity Tolerance: Other (comment) (Limited by  hypotension and weakness) Patient left: in bed;with call bell/phone within reach;with bed alarm set     Time: 1115-1140 PT Time Calculation (min) (ACUTE ONLY): 25 min  Charges:                       G CodesJanyth Contes January 17, 2015, 3:02 PM  Janyth Contes, SPT. [phone]

## 2015-01-06 NOTE — Consult Note (Signed)
Palliative Care Update   Pt is known to me as I saw him for several consult visits 2-3 weeks ago.  I am unable to see him today, but will see him and talk with family tomorrow.  I was able to review the chart and also was able to speak with Dr. Mike Gip about his condition.  I have noted that the son has made comments (documented in some  notes) that he would like the patient to be at home.  Of course, the patient is full care and is getting IV antifungals, periodic hemodialysis for what now appears to be ESRD (not reversible AKI).   Pt also has a sacral ulcer.  Last but not least important is the fact that his lymphoma is likely progressing or will be soon.  Going home --without being a Hospice patient seems unrealistic.  Dr. Mike Gip will be updated after I talk with pt/ family and we may together meet with them to discuss what the likely natural course will be in the near future for the patient.      Kirby Funk, MD

## 2015-01-07 DIAGNOSIS — J449 Chronic obstructive pulmonary disease, unspecified: Secondary | ICD-10-CM

## 2015-01-07 DIAGNOSIS — R531 Weakness: Secondary | ICD-10-CM

## 2015-01-07 LAB — CULTURE, BLOOD (ROUTINE X 2)

## 2015-01-07 LAB — MAGNESIUM: MAGNESIUM: 1.8 mg/dL (ref 1.7–2.4)

## 2015-01-07 LAB — GLUCOSE, CAPILLARY
GLUCOSE-CAPILLARY: 172 mg/dL — AB (ref 65–99)
Glucose-Capillary: 147 mg/dL — ABNORMAL HIGH (ref 65–99)
Glucose-Capillary: 86 mg/dL (ref 65–99)
Glucose-Capillary: 183 mg/dL — ABNORMAL HIGH (ref 65–99)

## 2015-01-07 LAB — POTASSIUM: Potassium: 2.8 mmol/L — CL (ref 3.5–5.1)

## 2015-01-07 MED ORDER — POTASSIUM CHLORIDE 20 MEQ PO PACK
40.0000 meq | PACK | Freq: Once | ORAL | Status: AC
  Administered 2015-01-07: 40 meq via ORAL
  Filled 2015-01-07: qty 2

## 2015-01-07 NOTE — Care Management (Signed)
Patient no longer has any lines or ports other than a peripheral IV for the antifungals.  Nutritional status remains poor.  He is no longer able to receive dialysis as the temporary catheter to be removed.  The blood cultures from 7/24 so far are negative.  If able to have permcath inserted for dialysis which appears to be for esrd now, patient will have to be able to sit up for dialysis. Discussed another family meeting with patient's son, daughter, attending and palliative care, CM and CSW for 7/27.

## 2015-01-07 NOTE — Progress Notes (Signed)
[facility] [place] NAME: Caleb Francis    MR#:  [id number]  DATE OF BIRTH:  01/14/1946  SUBJECTIVE:  Very weak. Not much po intake other than few sips of mighty shakes Became hypotensive when attempted to make him sit. Pt not much motivated to do anythig due to severe deconditioning REVIEW OF SYSTEMS:   Review of Systems  Constitutional: Positive for malaise/fatigue. Negative for fever, chills and weight loss.  HENT: Negative for ear discharge, ear pain and nosebleeds.   Eyes: Negative for blurred vision, pain and discharge.  Respiratory: Negative for sputum production, shortness of breath, wheezing and stridor.   Cardiovascular: Negative for chest pain, palpitations, orthopnea and PND.  Gastrointestinal: Negative for nausea, vomiting, abdominal pain and diarrhea.  Genitourinary: Negative for urgency and frequency.  Musculoskeletal: Positive for back pain. Negative for joint pain.  Neurological: Positive for weakness. Negative for sensory change, speech change and focal weakness.  Psychiatric/Behavioral: Negative for depression. The patient is not nervous/anxious.    Tolerating Diet:very little Tolerating PT: eval pending   DRUG ALLERGIES:  No Known Allergies  VITALS:  Blood pressure 73/50, pulse 97, temperature 97.9 F (36.6 C), temperature source Oral, resp. rate 20, height 6' (1.829 m), weight 72.576 kg (160 lb), SpO2 100 %.  PHYSICAL EXAMINATION:   Physical Exam  GENERAL:  69 y.o.-year-old patient lying in the bed with no acute distress. Appears chronically ill EYES: Pupils equal, round, reactive to light and accommodation. No scleral icterus. Extraocular muscles intact.  HEENT: Head atraumatic, normocephalic. Oropharynx and nasopharynx clear.  NECK:  Supple, no jugular venous distention. No thyroid enlargement, no tenderness.  LUNGS: Normal breath sounds bilaterally, no wheezing, rales, rhonchi. No use of accessory  muscles of respiration.  CARDIOVASCULAR: S1, S2 normal. No murmurs, rubs, or gallops.  ABDOMEN: Soft, nontender, mild distended. Bowel sounds present. No organomegaly or mass.  EXTREMITIES: No cyanosis, clubbing or edema b/l.   Right groin catheter Left arm swelling + NEUROLOGIC: Cranial nerves II through XII are intact. No focal Motor or sensory deficits b/l.  Weak,severe deconditioning PSYCHIATRIC:  patient is alert and oriented x 2.  SKIN: stage II sacral ulcer.    LABORATORY PANEL:   CBC  Recent Labs Lab 01/04/15 0418  WBC 17.5*  HGB 7.1*  HCT 21.9*  PLT 122*    Chemistries   Recent Labs Lab 01/04/15 0418  01/07/15 0033 01/07/15 0546  NA 134*  --   --   --   K 3.2*  < > 2.8*  --   CL 97*  --   --   --   CO2 23  --   --   --   GLUCOSE 118*  --   --   --   BUN 45*  --   --   --   CREATININE 5.61*  --   --   --   CALCIUM 7.1*  --   --   --   MG  --   --   --  1.8  < > = values in this interval not displayed. ASSESSMENT AND PLAN:   #1 sepsis: - Fungemia with Candida parapsilosis blood cultures 7/15, and again on repeat culture 7/17and 7/21 Appreciate infectious disease consultation. Continue micafungin.  - Port-A-Cath removed on 7/21 by Dr. Lucky Cowboy. Cath tip neg - Bacteremia with Klebsiella oxytoca status post 2 weeks of IV ceftaz resolved - Bacteremia with coag negative Staphylococcus, treated with vancomycin, echo negative resolved -  Hemodialysis catheter placed on the 17th, urethral catheter placed 26 days ago - bladder training completed  and removed foley 01/07/15 - Removed HD from right groin  01/05/15. Cath tip GNR. awiat ID recommendation   #2 acute renal failure due to ATN-->now progressed to ESRD - Appreciate nephrology following, receiving hemodialysis - This is likely end stage renal disease, no improvement in renal function minimal urine output. He will need long-term dialysis -pt currently has no HD access  #3 Mantle cell lymphoma -recvdNeupogen,  status post 2 units packed red blood cells on 6/25 and 1 unit on 7/2 - Prognosis is poor.-followed by Dr Mike Gip -at present given overall pt's decline and varioius infections not sure if pt is even a candidate for further Chemotherapy  #4 chronic ileus with h/o GI bleeding: Clinically improving. No further bleeding. NG tube and rectal tube discontinued 7/12 - Continue PPI, high risk for further bleeding due to thrombocytopenia - Last seen by GI on July 4. No further plans for endoscopy. - Persistent ileus seen on KUB 7/21, is passing watery stools.   #5 deconditioning: Unable to participate in physical therapy due to poor motivation and severe deconditioning. Spoke with PT to see if he improves  #6 protein calorie malnutrition: Continue Marinol. Encourage intake. Discussed with family and the patient they would not be interested in PEG tube placement.  -pt's calorie count now stopped due to severe poor po intake  #7 encephalopathy: Appreciate neurology consultation. This is likely encephalopathy/delirium due to critical illness. Agree with when necessary Geodon for agitation. Has not been agitated -pt is improving with his mood slowly  #8 pressure ulcers: Wound care consultation appreciated  #9 left basilic vein superficial extensive thrombosis Spoke with dr Delana Meyer and rec give eliquis 2.5 mg bid and repeat US upper extremity in 3-4 days to ensure thrombus not extending further  Overall very unfortunate pt with progressive decline.  Await family meeting with [facility] on 7/27  CODE STATUS: DO NOT RESUSCITATE  Case discussed with Care Management/Social Worker. Management plans discussed with the patient, CSW and Dr Megan Salon TOTAL TIME TAKING CARE OF THIS PATIENT: 30 minutes.    Glendine Swetz M.D on 01/07/2015 at 11:16 AM  Between 7am to 6pm - Pager - [phone]  After 6pm go to [email or web address] - password [email or web address] [facility]  [facility]  Office   [phone]  CC: Primary care physician; No PCP Per Patient

## 2015-01-07 NOTE — Progress Notes (Signed)
Patient continues to be very weak but alert and oriented,potassium improved after 6 runs of kcl but requiring additional supplement.

## 2015-01-07 NOTE — Progress Notes (Signed)
Nutrition Follow-up  DOCUMENTATION CODES:   Severe malnutrition in context of acute illness/injury  INTERVENTION:   1) Coordination of Care: discussed nutritional poc with MD Posey Pronto; calorie count completed yesterday; Per Calorie Count, took Orange Juice and some Mighty Shake at Breakfast (sips of Nepro mid morning); 1/4 of sandwich at lunch with bites of soup and 0% at dinner. PO intake remains poor as previously documented; pt unable to tell writer what he would like to eat or why he is not eating. MD ok with discontinuing calorie count at present. As previously documented, noted family/pt do not want to feeding tube placed. Receiving supplements, on appetite stimulant, being assisted at meal times. Will continue to follow but will not see pt daily unless consulted.  2) Meals and Snacks: Cater to patient preferences; pt unable to tell writer anything he wants to eat. Pt is on a Regular Diet; may order whatever he likes. Family may place orders for pt as well; family may bring in outside food for pt  3) Medical Food Supplement Therapy: continue all supplements (Magic Cup, [employer], Nepro) at present despite pt reporting at times to nsg staff that he does not like them and does not want to drink them; discussed with pt this AM as pt drank 100% of mighty shake at breakfast and reports its ok.    NUTRITION DIAGNOSIS:   Inadequate oral intake related to inability to eat as evidenced by NPO status. Continues but Being addressed as pt on diet, receiving supplements, on appetite stimulant, being assisted at meal times.    GOAL:   Patient will meet greater than or equal to 90% of their needs   MONITOR:    (Energy intake, Anthropometrics, Electrolyte/renal profile, Glucose profile, Digestive System)  ASSESSMENT:     Pt alert this AM, trying to eat breakfast. Pt attempted to sit on side of bed to eat breakfast, able to take 1 bite and then fell back in the bed. Very weak.   Diet Order:   Diet regular Room service appropriate?: Yes; Fluid consistency:: Thin   Energy Intake: po intake remains poor as noted above; pt eating mostly bites/sips; occasionally will drink an entire supplement. This AM, pt drank Mighty Shake, took bites of breakfast sandwich (biscuit, egg, cheese, ham).   Skin:   (stage II on buttock, stage I on sacrum)  Last BM:   +BM, pt reports no N/V, no abdominal pain  Electrolyte and Renal Profile:  Recent Labs Lab 01/02/15 0453 01/02/15 1303 01/03/15 0444 01/04/15 0418 01/06/15 1307 01/07/15 0033 01/07/15 0546  BUN 49*  --  35* 45*  --   --   --   CREATININE 5.48*  --  4.75* 5.61*  --   --   --   NA 136  --  140 134*  --   --   --   K 2.8*  --  2.9* 3.2* 2.2* 2.8*  --   MG  --   --   --   --   --   --  1.8  PHOS  --  2.3*  --   --   --   --   --    Glucose Profile:  Recent Labs  01/06/15 2018 01/07/15 0149 01/07/15 0727  GLUCAP 121* 147* 86   Protein Profile: No results for input(s): ALBUMIN in the last 168 hours.  Nutritional Anemia Profile:  CBC Latest Ref Rng 01/04/2015 01/03/2015 01/02/2015  WBC 3.8 - 10.6 K/uL 17.5(H) 16.2(H) 14.5(H)  Hemoglobin 13.0 - 18.0 g/dL 7.1(L) 7.0(L) 7.4(L)  Hematocrit 40.0 - 52.0 % 21.9(L) 21.2(L) 22.2(L)  Platelets 150 - 440 K/uL 122(L) 88(L) 66(L)    Meds:   Height:   Ht Readings from Last 1 Encounters:  11/29/14 6' (1.829 m)    Weight:   Wt Readings from Last 1 Encounters:  01/07/15 160 lb (72.576 kg)     Wt Readings from Last 10 Encounters:  01/07/15 160 lb (72.576 kg)  11/27/14 150 lb 5.7 oz (68.2 kg)  11/25/14 156 lb (70.761 kg)  11/18/14 157 lb 11.2 oz (71.532 kg)  11/05/14 164 lb 0.4 oz (74.4 kg)  10/24/14 165 lb (74.844 kg)  10/24/14 169 lb 5 oz (76.8 kg)  10/22/14 168 lb (76.204 kg)    BMI:  Body mass index is 21.7 kg/(m^2).  Estimated Nutritional Needs:   Kcal:  3338-3291 kcals (BEE 1436, 1.2 AF, 1.2-1.4 IF) using current wt of 62.9 kg  Protein:  76-95 (1.2-1.5 g/kg)    Fluid:  1575-1890 mL (25-30 ml/kg)   EDUCATION NEEDS:   No education needs identified at this time  [place], RD, LDN [phone] Pager

## 2015-01-07 NOTE — Progress Notes (Signed)
ID E note:  Events noted.  Remains afebrile.   Fungemia - C parapsilosis again growing 1/1 from 7/21.  HD catheter out.  Repeat 7/24 ngtd.   Depending on goals of care discussion, would do TEE if full care desired.  If minimizing interventions, treatments, could just use fluconazole 400 mg oral daily for 4-6 weeks total from 7/24 without searching for focus of persistent infection.   GNR on cath tip.  - no positive cultures, does not indicate need for treatment with antibacterials.  Continue to observe off of antibiotics.    Scharlene Gloss, [place]

## 2015-01-07 NOTE — Progress Notes (Signed)
Physical Therapy Treatment Patient Details Name: Caleb Francis MRN: [id number] DOB: 1945/10/25 Today's Date: 01/07/2015    History of Present Illness Pt is a 69 y.o. 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  Temporary R dialysis catheter placed in R groin 12/17/14 (removed and replaced 7/17 d/t infection/fevers) and currently receiving dialysis.  Pt was in ICU but currently on floor.  Pt with recent admiission 11/08/14-11/19/14 for sepsis and AMS d/t UTI.  Pt went to [facility] prior to re-admission. Temp HD cath removed 01/05/15. Will place perm cath in next few days, per chart.    PT Comments    Pt is progressively more motivated to participate in therapy. Pt was willing and able to perform and advance his bed exercises, but was reluctant to try standing due to fatigue. Conversation was had with pt about wishing to walk and return function, and the idea of going to a rehabilitation facility, and he agreed with that recommendation. [name] general deconditioning and decreased strength will benefit from skilled PT in order to return pt to premorbid state.   Follow Up Recommendations  SNF     Equipment Recommendations  Other (comment) (TBD)    Recommendations for Other Services       Precautions / Restrictions Restrictions Weight Bearing Restrictions: No    Mobility  Bed Mobility Overal bed mobility: Needs Assistance Bed Mobility: Supine to Sit     Supine to sit: Mod assist;+2 for physical assistance     General bed mobility comments: Pt requires bodyweight support as well as management of LEs. Pt needs cues to lean forward and use his hands to keep his balance.    Transfers                 General transfer comment: Not attempted today due to pt weakness an, although blood pressure responded better today once the pt was in sitting. Vitals: supine  BP of 87/58, sitting BP of 95/64   Ambulation/Gait             General Gait Details: Not safe to attempt this date   Stairs            Wheelchair Mobility    Modified Rankin (Stroke Patients Only)       Balance Overall balance assessment: Needs assistance Sitting-balance support: Bilateral upper extremity supported;Feet unsupported Sitting balance-Leahy Scale: Fair Sitting balance - Comments: Requires cues for hand placement and min assist from therapist for latero-posterior lean.  Postural control: Left lateral lean;Posterior lean                          Cognition Arousal/Alertness: Awake/alert Behavior During Therapy: WFL for tasks assessed/performed Overall Cognitive Status: Within Functional Limits for tasks assessed                      Exercises Other Exercises Other Exercises: Pt performed bilateral LE exercise x12. He required min assist for faciliation of movement. Repetitions have increased this date and pt is motivated to perform ther-ex. Exercises included: ankle pumps, quad sets, glute sets, heel slides, SLR, hip abd/add,      General Comments        Pertinent Vitals/Pain Pain Assessment: No/denies pain    Home Living  Prior Function            PT Goals (current goals can now be found in the care plan section) Acute Rehab PT Goals Patient Stated Goal: To work with therapy PT Goal Formulation: With patient Time For Goal Achievement: 01-25-2015 Potential to Achieve Goals: Fair Progress towards PT goals: Progressing toward goals    Frequency  Min 2X/week    PT Plan Current plan remains appropriate    Co-evaluation             End of Session   Activity Tolerance: Patient tolerated treatment well (Motivated this date, but still limited by fatigue. ) Patient left: in bed;with call bell/phone within reach;with bed alarm set     Time: 1135-1205 PT Time Calculation (min) (ACUTE  ONLY): 30 min  Charges:                       G CodesJanyth Contes 01-25-2015, 3:51 PM  Janyth Contes, SPT. [phone]

## 2015-01-07 NOTE — Progress Notes (Signed)
Subjective:  Patient awake and alert.  Last hemodialysis treatment on Saturday 7/23 UOP 2150 (900)   Objective:  Vital signs in last 24 hours:  Temp:  [98.1 F (36.7 C)-99.3 F (37.4 C)] 98.6 F (37 C) (07/26 0917) Pulse Rate:  [91-101] 101 (07/26 0917) Resp:  [16-20] 16 (07/26 0917) BP: (92-105)/(49-71) 104/71 mmHg (07/26 0917) SpO2:  [96 %-100 %] 100 % (07/26 0917) Weight:  [72.576 kg (160 lb)] 72.576 kg (160 lb) (07/26 0433)  Weight change: 1.497 kg (3 lb 4.8 oz) Filed Weights   01/05/15 0510 01/06/15 0431 01/07/15 0433  Weight: 71.351 kg (157 lb 4.8 oz) 71.079 kg (156 lb 11.2 oz) 72.576 kg (160 lb)    Intake/Output: I/O last 3 completed shifts: In: 1260 [P.O.:960; I.V.:200; IV Piggyback:100] Out: 2351 [Urine:2350; Stool:1]   Intake/Output this shift:     Physical Exam: General: chronically ill appearing  Head: oral mucosa moist  Eyes: Anicteric  Neck: Supple, trachea midline  Lungs:  Clear to auscultation normal effort  Heart: S1S2 no rubs  Abdomen:  Soft, NTND, BS present  Extremities: No LE edema  Neurologic: Awake, alert, oriented to self and place, follows commands  Skin: No acute rashes  GU foley  Access: none    Basic Metabolic Panel:  Recent Labs Lab 01/01/15 0453 01/02/15 0453 01/02/15 1303 01/03/15 0444 01/04/15 0418 01/06/15 1307 01/07/15 0033 01/07/15 0546  NA 139 136  --  140 134*  --   --   --   K 3.0* 2.8*  --  2.9* 3.2* 2.2* 2.8*  --   CL 100* 100*  --  100* 97*  --   --   --   CO2 26 26  --  28 23  --   --   --   GLUCOSE 112* 98  --  105* 118*  --   --   --   BUN 38* 49*  --  35* 45*  --   --   --   CREATININE 4.31* 5.48*  --  4.75* 5.61*  --   --   --   CALCIUM 7.4* 6.9*  --  7.4* 7.1*  --   --   --   MG  --   --   --   --   --   --   --  1.8  PHOS  --   --  2.3*  --   --   --   --   --     Liver Function Tests: No results for input(s): AST, ALT, ALKPHOS, BILITOT, PROT, ALBUMIN in the last 168 hours. No results for  input(s): LIPASE, AMYLASE in the last 168 hours. No results for input(s): AMMONIA in the last 168 hours.  CBC:  Recent Labs Lab 01/01/15 0453 01/02/15 0453 01/03/15 0444 01/04/15 0418  WBC 11.1* 14.5* 16.2* 17.5*  NEUTROABS 7.9*  --   --   --   HGB 7.3* 7.4* 7.0* 7.1*  HCT 21.6* 22.2* 21.2* 21.9*  MCV 84.1 84.4 85.1 86.1  PLT 56* 66* 88* 122*    Cardiac Enzymes: No results for input(s): CKTOTAL, CKMB, CKMBINDEX, TROPONINI in the last 168 hours.  BNP: Invalid input(s): POCBNP  CBG:  Recent Labs Lab 01/06/15 1225 01/06/15 1642 01/06/15 2018 01/07/15 0149 01/07/15 0727  GLUCAP 131* 137* 121* 147* 17    Microbiology: Results for orders placed or performed during the hospital encounter of 11/29/14  MRSA PCR Screening     Status: None  Collection Time: 11/29/14 12:13 PM  Result Value Ref Range Status   MRSA by PCR NEGATIVE NEGATIVE Final    Comment:        The GeneXpert MRSA Assay (FDA approved for NASAL specimens only), is one component of a comprehensive MRSA colonization surveillance program. It is not intended to diagnose MRSA infection nor to guide or monitor treatment for MRSA infections.   Culture, blood (routine x 2)     Status: None   Collection Time: 11/29/14  1:19 PM  Result Value Ref Range Status   Specimen Description BLOOD  Final   Special Requests Immunocompromised  Final   Culture NO GROWTH 5 DAYS  Final   Report Status 12/04/2014 FINAL  Final  Culture, blood (routine x 2)     Status: None   Collection Time: 11/29/14  3:04 PM  Result Value Ref Range Status   Specimen Description Blood  Final   Special Requests Immunocompromised  Final   Report Status 01/03/2015 FINAL  Final  Urine culture     Status: None   Collection Time: 11/30/14 10:12 AM  Result Value Ref Range Status   Specimen Description URINE, CATHETERIZED  Final   Special Requests Immunocompromised  Final   Culture NO GROWTH 2 DAYS  Final   Report Status 12/02/2014 FINAL   Final  Culture, blood (routine x 2)     Status: None   Collection Time: 12/06/14  8:08 PM  Result Value Ref Range Status   Specimen Description BLOOD  Final   Special Requests NONE  Final   Culture  Setup Time   Final    GRAM POSITIVE COCCI IN BOTH AEROBIC AND ANAEROBIC BOTTLES CRITICAL RESULT CALLED TO, READ BACK BY AND VERIFIED WITH: JENNIFER BEZARD AT 1610 12/08/14.PMH CONFIRMED BY RWW    Culture   Final    STAPHYLOCOCCUS AURICULARIS IN BOTH AEROBIC AND ANAEROBIC BOTTLES    Report Status 12/11/2014 FINAL  Final   Organism ID, Bacteria STAPHYLOCOCCUS AURICULARIS  Final      Susceptibility   Staphylococcus auricularis - MIC*    CIPROFLOXACIN >=8 RESISTANT Resistant     ERYTHROMYCIN >=8 RESISTANT Resistant     GENTAMICIN <=0.5 SENSITIVE Sensitive     OXACILLIN >=4 RESISTANT Resistant     TETRACYCLINE <=1 SENSITIVE Sensitive     VANCOMYCIN 1 SENSITIVE Sensitive     CLINDAMYCIN <=0.25 SENSITIVE Sensitive     TRIMETH/SULFA Value in next row Sensitive      SENSITIVE<=20    LEVOFLOXACIN Value in next row Intermediate      INTERMEDIATE4    * STAPHYLOCOCCUS AURICULARIS  Culture, blood (routine x 2)     Status: None   Collection Time: 12/06/14  8:40 PM  Result Value Ref Range Status   Specimen Description BLOOD  Final   Special Requests NONE  Final   Culture NO GROWTH 5 DAYS  Final   Report Status 12/11/2014 FINAL  Final  Culture, blood (routine x 2)     Status: None   Collection Time: 12/08/14 11:40 AM  Result Value Ref Range Status   Specimen Description BLOOD  Final   Special Requests Normal  Final   Culture NO GROWTH 6 DAYS  Final   Report Status 12/14/2014 FINAL  Final  Culture, blood (routine x 2)     Status: None   Collection Time: 12/08/14 11:49 AM  Result Value Ref Range Status   Specimen Description BLOOD  Final   Special Requests Normal  Final  Culture NO GROWTH 6 DAYS  Final   Report Status 12/14/2014 FINAL  Final  C difficile quick scan w PCR reflex ([facility]  only)     Status: None   Collection Time: 12/14/14 10:11 AM  Result Value Ref Range Status   C Diff antigen NEGATIVE  Final   C Diff toxin NEGATIVE  Final   C Diff interpretation Negative for C. difficile  Final  Culture, blood (routine x 2)     Status: None   Collection Time: 12/27/14 11:28 AM  Result Value Ref Range Status   Specimen Description BLOOD  Final   Special Requests Immunocompromised  Final   Culture  Setup Time   Final    YEAST AEROBIC BOTTLE ONLY CRITICAL RESULT CALLED TO, READ BACK BY AND VERIFIED WITH: CATHY SUMMERLIN ON 12/29/14 AT 1000AM BY JEF    Culture CANDIDA PARAPSILOSIS AEROBIC BOTTLE ONLY   Final   Report Status 01/02/2015 FINAL  Final  Culture, blood (routine x 2)     Status: None   Collection Time: 12/27/14 11:36 AM  Result Value Ref Range Status   Specimen Description BLOOD  Final   Special Requests Immunocompromised  Final   Culture  Setup Time   Final    YEAST AEROBIC BOTTLE ONLY CRITICAL RESULT CALLED TO, READ BACK BY AND VERIFIED WITH: LISA ROMERO AT 0111 ON 12/29/14 RWW CONFIRMED BY [place]    Culture CANDIDA PARAPSILOSIS AEROBIC BOTTLE ONLY   Final   Report Status 01/02/2015 FINAL  Final  Cath Tip Culture     Status: None   Collection Time: 12/27/14  4:17 PM  Result Value Ref Range Status   Specimen Description CATH TIP  Final   Special Requests NONE  Final   Culture NO GROWTH 2 DAYS  Final   Report Status 12/30/2014 FINAL  Final  Culture, blood (routine x 2)     Status: None   Collection Time: 12/29/14 12:28 PM  Result Value Ref Range Status   Specimen Description BLOOD RIGHT ARM  Final   Special Requests   Final    BOTTLES DRAWN AEROBIC AND ANAEROBIC  AER 4CC ANA 3CC   Culture  Setup Time   Final    BUDDING YEAST SEEN AEROBIC BOTTLE ONLY CRITICAL RESULT CALLED TO, READ BACK BY AND VERIFIED WITH: BROKE ROBERTSON AT 2230 01/01/15.TSH CONFIRMED BY SDR    Culture CANDIDA PARAPSILOSIS AEROBIC BOTTLE ONLY   Final   Report Status  01/04/2015 FINAL  Final  Culture, blood (routine x 2)     Status: None   Collection Time: 12/29/14 12:28 PM  Result Value Ref Range Status   Specimen Description BLOOD RIGHT FATTY CASTS  Final   Special Requests BOTTLES DRAWN AEROBIC AND ANAEROBIC  5CC  Final   Culture  Setup Time   Final    YEAST AEROBIC BOTTLE ONLY CRITICAL VALUE NOTED.  VALUE IS CONSISTENT WITH PREVIOUSLY REPORTED AND CALLED VALUE.    Culture CANDIDA PARAPSILOSIS  Final   Report Status 01/04/2015 FINAL  Final  Cath Tip Culture     Status: None   Collection Time: 01/02/15 12:42 PM  Result Value Ref Range Status   Specimen Description CATH TIP  Final   Special Requests Normal  Final   Culture NO GROWTH 3 DAYS  Final   Report Status 01/05/2015 FINAL  Final  Culture, blood (routine x 2)     Status: None (Preliminary result)   Collection Time: 01/02/15  6:34 PM  Result Value  Ref Range Status   Specimen Description BLOOD RIGHT HAND  Final   Special Requests 5 BOTTLES DRAWN AEROBIC AND ANAEROBIC 5ML  Final   Culture  Setup Time   Final    BUDDING YEAST SEEN ANAEROBIC BOTTLE ONLY CRITICAL RESULT CALLED TO, READ BACK BY AND VERIFIED WITH: IRIS GUIDRY AT 2030 01/04/15 SDR  CONFIRMED BY RW    Culture YEAST IDENTIFICATION TO FOLLOW   Final   Report Status PENDING  Incomplete  Culture, blood (routine x 2)     Status: None (Preliminary result)   Collection Time: 01/05/15  8:59 AM  Result Value Ref Range Status   Specimen Description BLOOD RIGHT ASSIST CONTROL  Final   Special Requests BOTTLES DRAWN AEROBIC AND ANAEROBIC 1ML  Final   Culture NO GROWTH 2 DAYS  Final   Report Status PENDING  Incomplete  Culture, blood (routine x 2)     Status: None (Preliminary result)   Collection Time: 01/05/15  9:10 AM  Result Value Ref Range Status   Specimen Description BLOOD LEFT HAND  Final   Special Requests BOTTLES DRAWN AEROBIC AND ANAEROBIC 1ML  Final   Culture NO GROWTH 2 DAYS  Final   Report Status PENDING  Incomplete   Cath Tip Culture     Status: None (Preliminary result)   Collection Time: 01/05/15 11:30 AM  Result Value Ref Range Status   Specimen Description CATH TIP  Final   Special Requests NONE  Final   Culture   Final    GRAM NEGATIVE RODS 2 DIFFERENT GRAM NEGATIVE RODS PRESENT  IDENTIFICATION TO FOLLOW SUSCEPTIBILITIES TO FOLLOW ONCE ISOLATED    Report Status PENDING  Incomplete    Coagulation Studies:  Recent Labs  01/05/15 1300  LABPROT 15.9*  INR 1.25    Urinalysis: No results for input(s): COLORURINE, LABSPEC, PHURINE, GLUCOSEU, HGBUR, BILIRUBINUR, KETONESUR, PROTEINUR, UROBILINOGEN, NITRITE, LEUKOCYTESUR in the last 72 hours.  Invalid input(s): APPERANCEUR    Imaging: US Venous Img Upper Uni Left  01/05/2015   CLINICAL DATA:  Left upper extremity edema and history of recent left arm IV access. History of lymphoma.  EXAM: LEFT UPPER EXTREMITY VENOUS DOPPLER ULTRASOUND  TECHNIQUE: Gray-scale sonography with graded compression, as well as color Doppler and duplex ultrasound were performed to evaluate the upper extremity deep venous system from the level of the subclavian vein and including the jugular, axillary, basilic, radial, ulnar and upper cephalic vein. Spectral Doppler was utilized to evaluate flow at rest and with distal augmentation maneuvers.  COMPARISON:  None.  FINDINGS: Contralateral Subclavian Vein: Respiratory phasicity is normal and symmetric with the symptomatic side. No evidence of thrombus. Normal compressibility.  Internal Jugular Vein: No evidence of thrombus. Normal compressibility, respiratory phasicity and response to augmentation.  Subclavian Vein: No evidence of thrombus. Normal compressibility, respiratory phasicity and response to augmentation.  Axillary Vein: No evidence of thrombus. Normal compressibility, respiratory phasicity and response to augmentation.  Cephalic Vein: No evidence of thrombus. Normal compressibility, respiratory phasicity and response to  augmentation.  Basilic Vein: Extensive occlusive thrombus is identified throughout most of the visualized basilic vein in the upper arm. The vein is noncompressible.  Brachial Veins: No evidence of thrombus. Normal compressibility, respiratory phasicity and response to augmentation.  Radial Veins: No evidence of thrombus. Normal compressibility, respiratory phasicity and response to augmentation.  Ulnar Veins: No evidence of thrombus. Normal compressibility, respiratory phasicity and response to augmentation.  Venous Reflux:  None visualized.  Other Findings: Abnormal soft tissue structure is  identified in the supraclavicular region measuring roughly 3.1 x 1.5 x 3.1 cm. This has the appearance of an enlarged pathologic lymph node.  IMPRESSION: 1. Superficial thrombophlebitis with extensive thrombus in the left basilic vein of the upper arm which would be considered a superficial vein. 2. No true deep vein thrombosis in the left upper extremity. 3. Abnormal soft tissue mass in the supraclavicular region is likely a pathologic lymph node related to the known diagnosis of lymphoma.   Electronically Signed   By: Aletta Edouard M.D.   On: 01/05/2015 11:17     Medications:     . amiodarone  200 mg Oral Daily  . antiseptic oral rinse  7 mL Mouth Rinse BID  . apixaban  2.5 mg Oral BID  . carvedilol  20 mg Oral Daily  . Chlorhexidine Gluconate Cloth  6 each Topical Once  . dronabinol  2.5 mg Oral BID AC  . epoetin (EPOGEN/PROCRIT) injection  14,000 Units Subcutaneous Q T,Th,Sa-HD  . feeding supplement (NEPRO CARB STEADY)  237 mL Oral BID BM  . insulin aspart  0-9 Units Subcutaneous Q6H  . micafungin (MYCAMINE) IV  100 mg Intravenous Daily  . mometasone-formoterol  2 puff Inhalation BID  . pantoprazole (PROTONIX) IV  40 mg Intravenous Q12H  . scopolamine  1 patch Transdermal Q72H   acetaminophen **OR** acetaminophen, albuterol, haloperidol lactate, lidocaine (PF), lidocaine-prilocaine, magnesium  hydroxide, metoprolol, ondansetron (ZOFRAN) IV, promethazine, sodium chloride, ziprasidone  Assessment/ Plan:  Mr. Pyon is a 23 black male with progressive mantle cell lymphoma, RICE chemo in 11/2014, hx left sided hydronephrosis and hydroureter. S/p ureteral stent placement   Hospital course complicated by sepsis with Klebsiella oxytoca, hypernatremia. Now with fungemia.   1.  Acute renal failure due to ATN N17.0: Pt has undergone multiple dialysis sessions.  Has temporary dialysis catheter removed due to fungemia and now without access.  Urine output is nonoliguric. No new labs. Next labs for tomorrow.   Last HD treatment on 7/23 - Check renal function panel. Continue to monitor renal function, urine output and volume status. - temp catheter to be placed when dialysis needed. Currently want to keep patient catheter free for 3-4 days.  - Urine output measurement will be difficult once foley removed. Will need labs more frequently.   2.  Sepsis with fungemia. A41.9, B49. Status post bacteremia with klebsiella oxytoca: completed two weeks for IV ceftazidime. Subsequently growing candida parapsilosis.  Subcutaneous port removed 01/02/15. - blood cultures from 7/21 now showing yeast despite micafungin.   - 7/24 culture gram negative rods.  - Appreciate ID input.  Currently on micafungin.   3.  Anemia of chronic kidney disease/mantle cell lymphoma:  continue epogen 14000 units IV with HD, use OK'd by hematology/onc. Currently on hold since no HD.   4. Secondary Hyperparathyroidism: PTH 126. Phos 2.3.    LOS: [place], [place] 7/26/20169:43 AM

## 2015-01-08 LAB — CBC
HEMATOCRIT: 18.8 % — AB (ref 40.0–52.0)
HEMOGLOBIN: 6.1 g/dL — AB (ref 13.0–18.0)
MCH: 28.2 pg (ref 26.0–34.0)
MCHC: 32.6 g/dL (ref 32.0–36.0)
MCV: 86.4 fL (ref 80.0–100.0)
PLATELETS: 131 10*3/uL — AB (ref 150–440)
RBC: 2.17 MIL/uL — ABNORMAL LOW (ref 4.40–5.90)
RDW: 19.1 % — ABNORMAL HIGH (ref 11.5–14.5)
WBC: 19.3 10*3/uL — ABNORMAL HIGH (ref 3.8–10.6)

## 2015-01-08 LAB — BASIC METABOLIC PANEL
Anion gap: 12 (ref 5–15)
BUN: 57 mg/dL — ABNORMAL HIGH (ref 6–20)
CALCIUM: 7.3 mg/dL — AB (ref 8.9–10.3)
CO2: 23 mmol/L (ref 22–32)
Chloride: 99 mmol/L — ABNORMAL LOW (ref 101–111)
Creatinine, Ser: 6.13 mg/dL — ABNORMAL HIGH (ref 0.61–1.24)
GFR calc non Af Amer: 8 mL/min — ABNORMAL LOW (ref >60)
GFR, EST AFRICAN AMERICAN: 10 mL/min — AB (ref >60)
GLUCOSE: 117 mg/dL — AB (ref 65–99)
POTASSIUM: 3.2 mmol/L — AB (ref 3.5–5.1)
Sodium: 134 mmol/L — ABNORMAL LOW (ref 135–145)

## 2015-01-08 LAB — PREPARE RBC (CROSSMATCH)

## 2015-01-08 LAB — GLUCOSE, CAPILLARY
Glucose-Capillary: 120 mg/dL — ABNORMAL HIGH (ref 65–99)
Glucose-Capillary: 109 mg/dL — ABNORMAL HIGH (ref 65–99)
Glucose-Capillary: 127 mg/dL — ABNORMAL HIGH (ref 65–99)
Glucose-Capillary: 119 mg/dL — ABNORMAL HIGH (ref 65–99)

## 2015-01-08 LAB — CATH TIP CULTURE

## 2015-01-08 LAB — HEMOGLOBIN AND HEMATOCRIT, BLOOD
HEMATOCRIT: 22.2 % — AB (ref 40.0–52.0)
HEMOGLOBIN: 7.2 g/dL — AB (ref 13.0–18.0)

## 2015-01-08 MED ORDER — POTASSIUM CHLORIDE CRYS ER 20 MEQ PO TBCR
40.0000 meq | EXTENDED_RELEASE_TABLET | Freq: Once | ORAL | Status: AC
  Administered 2015-01-08: 40 meq via ORAL
  Filled 2015-01-08: qty 2

## 2015-01-08 MED ORDER — SODIUM CHLORIDE 0.9 % IV SOLN
Freq: Once | INTRAVENOUS | Status: AC
  Administered 2015-01-08: 12:00:00 via INTRAVENOUS

## 2015-01-08 MED ORDER — PANTOPRAZOLE SODIUM 40 MG PO TBEC
40.0000 mg | DELAYED_RELEASE_TABLET | Freq: Two times a day (BID) | ORAL | Status: DC
  Administered 2015-01-08 – 2015-01-24 (×31): 40 mg via ORAL
  Filled 2015-01-08 (×31): qty 1

## 2015-01-08 NOTE — Progress Notes (Signed)
[place] [facility] [place] NAME: Caleb Francis    MR#:  [id number]  DATE OF BIRTH:  [birth year]  SUBJECTIVE:  About same - Very weak. Not much po intake other than few sips of mighty shakes. Pt not much motivated to do anythig due to severe deconditioning. Son and family at bedside. REVIEW OF SYSTEMS:   Review of Systems  Constitutional: Positive for malaise/fatigue. Negative for fever, chills and weight loss.  HENT: Negative for ear discharge, ear pain and nosebleeds.   Eyes: Negative for blurred vision, pain and discharge.  Respiratory: Negative for sputum production, shortness of breath, wheezing and stridor.   Cardiovascular: Negative for chest pain, palpitations, orthopnea and PND.  Gastrointestinal: Negative for nausea, vomiting, abdominal pain and diarrhea.  Genitourinary: Negative for urgency and frequency.  Musculoskeletal: Positive for back pain. Negative for joint pain.  Neurological: Positive for weakness. Negative for sensory change, speech change and focal weakness.  Psychiatric/Behavioral: Negative for depression. The patient is not nervous/anxious.    Tolerating Diet:very little Tolerating PT: eval pending   DRUG ALLERGIES:  No Known Allergies VITALS:  Blood pressure 110/58, pulse 100, temperature 98.6 F (37 C), temperature source Oral, resp. rate 16, height 6' (1.829 m), weight 72.303 kg (159 lb 6.4 oz), SpO2 99 %. PHYSICAL EXAMINATION:   Physical Exam GENERAL:  69 y.o.-year-old patient lying in the bed with no acute distress. Appears chronically ill EYES: Pupils equal, round, reactive to light and accommodation. No scleral icterus. Extraocular muscles intact.  HEENT: Head atraumatic, normocephalic. Oropharynx and nasopharynx clear.  NECK:  Supple, no jugular venous distention. No thyroid enlargement, no tenderness.  LUNGS: Normal breath sounds bilaterally, no wheezing, rales, rhonchi. No use of accessory muscles of  respiration.  CARDIOVASCULAR: S1, S2 normal. No murmurs, rubs, or gallops.  ABDOMEN: Soft, nontender, mild distended. Bowel sounds present. No organomegaly or mass.  EXTREMITIES: No cyanosis, clubbing or edema b/l.   Right groin catheter Left arm swelling + NEUROLOGIC: Cranial nerves II through XII are intact. No focal Motor or sensory deficits b/l.  Weak,severe deconditioning PSYCHIATRIC:  patient is alert and oriented x 2.  SKIN: stage II sacral ulcer.  LABORATORY PANEL:   CBC  Recent Labs Lab 01/08/15 0355 01/08/15 1443  WBC 19.3*  --   HGB 6.1* 7.2*  HCT 18.8* 22.2*  PLT 131*  --     Chemistries   Recent Labs Lab 01/07/15 0546 01/08/15 0355  NA  --  134*  K  --  3.2*  CL  --  99*  CO2  --  23  GLUCOSE  --  117*  BUN  --  57*  CREATININE  --  6.13*  CALCIUM  --  7.3*  MG 1.8  --    ASSESSMENT AND PLAN:   #1 sepsis: - Fungemia with Candida parapsilosis blood cultures 7/15, and again on repeat culture 7/17and 7/21 Appreciate infectious disease consultation. Continue micafungin.  - Port-A-Cath removed on 7/21 by Dr. Lucky Cowboy. Cath tip neg - Bacteremia with Klebsiella oxytoca status post 2 weeks of IV ceftaz resolved - Bacteremia with coag negative Staphylococcus, treated with vancomycin, echo negative resolved - Hemodialysis catheter placed on the 17th, urethral catheter placed 26 days ago - bladder training completed  and removed foley 01/07/15 - Removed HD from right groin  01/05/15. Cath tip GNR.  - Appreciate ID input: C parapsilosis again growing 1/1 from 7/21. HD catheter out. Repeat 7/24 ngtd.  - Palliative care and  Onco to meet with family tomorrow at 12:30 to discuss goals of care: could do fluconazole 400 mg oral daily for 4-6 weeks total from 7/24 without searching for focus of persistent infection per ID    #2 acute renal failure due to ATN-->now progressed to ESRD - Appreciate nephrology following, receiving hemodialysis - This is likely end stage renal  disease, no improvement in renal function minimal urine output. He will need long-term dialysis -pt currently has no HD access  #3 Mantle cell lymphoma -recvdNeupogen, status post 2 units packed red blood cells on 6/25 and 1 unit on 7/2 - Prognosis is poor.-followed by Dr Mike Gip -at present given overall pt's decline and varioius infections not sure if pt is even a candidate for further Chemotherapy  #4 chronic ileus with h/o GI bleeding: Clinically improving. No further bleeding. NG tube and rectal tube discontinued 7/12 - Continue PPI, high risk for further bleeding due to thrombocytopenia - Last seen by GI on July 4. No further plans for endoscopy. - Persistent ileus seen on KUB 7/21, is passing watery stools.   #5 deconditioning: Unable to participate in physical therapy due to poor motivation and severe deconditioning. Spoke with PT to see if he improves  #6 protein calorie malnutrition: Continue Marinol. Encourage intake. Discussed with family and the patient they would not be interested in PEG tube placement.  -pt's calorie count now stopped due to severe poor po intake  #7 encephalopathy: Appreciate neurology consultation. This is likely encephalopathy/delirium due to critical illness. Agree with when necessary Geodon for agitation. Has not been agitated -pt is improving with his mood slowly  #8 pressure ulcers: Wound care consultation appreciated  #9 left basilic vein superficial extensive thrombosis Spoke with dr Delana Meyer and rec give eliquis 2.5 mg bid and repeat US upper extremity in 3-4 days to ensure thrombus not extending further  Overall very unfortunate pt with progressive decline.  Await family meeting with Palliaive care on 7/27  CODE STATUS: DO NOT RESUSCITATE  Case discussed with Care Management/Social Worker. Management plans discussed with the patient, CSW and Dr Megan Salon TOTAL TIME TAKING CARE OF THIS PATIENT: 30 minutes.    [facility], Vian Fluegel M.D on 01/08/2015 at  5:46 PM  Between 7am to 6pm - Pager - [phone]  After 6pm go to [email or web address] - password [email or web address] [facility]  [place] [facility]  Office  [phone]  CC: Primary care physician; No PCP Per Patient

## 2015-01-08 NOTE — Care Management Important Message (Signed)
Important Message  Patient Details  Name: Caleb Francis MRN: [id number] Date of Birth: Oct 18, 1945   Medicare Important Message Given:  Yes-second notification given    [name] 01/08/2015, 11:03 AM

## 2015-01-08 NOTE — Progress Notes (Signed)
ID E note:  Events noted.  Fungemia - C parapsilosis again growing 1/1 from 7/21.  HD catheter out.  Repeat 7/24 ngtd.   Noted goals of care discussion and follow up discussion tomorrow.  If minimizing interventions, treatments, could just use fluconazole 400 mg oral daily for 4-6 weeks total from 7/24 without searching for focus of persistent infection.   Dr. Ola Spurr back tomorrow    Scharlene Gloss, [place]  Pg [phone]

## 2015-01-08 NOTE — Consult Note (Signed)
Palliative Medicine Inpatient Consult Follow Up Note   Name: Caleb Francis Date: 01/08/2015 MRN: [id number]  DOB: Apr 20, 1946  Referring Physician: Max Sane, MD  Palliative Care consult requested for this 69 y.o. male for goals of medical therapy in patient with Mantle Cell Lymphoma, ESRD, numerous ongoing infections, weakness, and poor intake.    REVIEW OF SYSTEMS:  I feel fine.    CODE STATUS: DNR   PAST MEDICAL HISTORY: Past Medical History  Diagnosis Date  . Arthritis   . Hypertension   . RA (rheumatoid arthritis)   . Anemia   . Mantle cell lymphoma     Dr Mike Gip  . History of nuclear stress test     a. 12/2013: low risk, no sig ischemia, no EKG changes, no artifact, EF 63%  . Chronic kidney disease   . Sepsis     Dr Ola Spurr    PAST SURGICAL HISTORY:  Past Surgical History  Procedure Laterality Date  . Back surgery    . Fracture surgery      ankle  . Portacath placement    . Cystoscopy w/ ureteral stent placement Left 10/24/2014    Procedure: CYSTOSCOPY WITH RETROGRADE PYELOGRAM/URETERAL STENT PLACEMENT;  Surgeon: Irine Seal, MD;  Location: [facility];  Service: Urology;  Laterality: Left;  . Peripheral vascular catheterization N/A 01/02/2015    Procedure: Porta Cath Removal;  Surgeon: Algernon Huxley, MD;  Location: [facility];  Service: Cardiovascular;  Laterality: N/A;    Vital Signs: BP 110/58 mmHg  Pulse 100  Temp(Src) 98.6 F (37 C) (Oral)  Resp 16  Ht 6' (1.829 m)  Wt 72.303 kg (159 lb 6.4 oz)  BMI 21.61 kg/m2  SpO2 99% Filed Weights   01/06/15 0431 01/07/15 0433 01/08/15 0414  Weight: 71.079 kg (156 lb 11.2 oz) 72.576 kg (160 lb) 72.303 kg (159 lb 6.4 oz)    Estimated body mass index is 21.61 kg/(m^2) as calculated from the following:   Height as of this encounter: 6' (1.829 m).   Weight as of this encounter: 72.303 kg (159 lb 6.4 oz).  PHYSICAL EXAM: Cachectic Alert and states, "I feel fine" EOMI OP clear NO JVD or  thyromegaly Heart rrr no mgr Lungs clear anteriorly Abd soft and NT Ext cachectic --moves all 4/s  LABS: CBC:    Component Value Date/Time   WBC 19.3* 01/08/2015 0355   WBC 6.4 07/17/2014 1121   HGB 7.2* 01/08/2015 1443   HGB 11.8* 07/17/2014 1121   HCT 22.2* 01/08/2015 1443   HCT 35.0* 07/17/2014 1121   PLT 131* 01/08/2015 0355   PLT 263 07/17/2014 1121   MCV 86.4 01/08/2015 0355   MCV 83 07/17/2014 1121   NEUTROABS 7.9* 01/01/2015 0453   NEUTROABS 3.4 07/17/2014 1121   LYMPHSABS 1.4 01/01/2015 0453   LYMPHSABS 1.9 07/17/2014 1121   MONOABS 1.1* 01/01/2015 0453   MONOABS 0.8 07/17/2014 1121   EOSABS 0.7 01/01/2015 0453   EOSABS 0.2 07/17/2014 1121   BASOSABS 0.0 01/01/2015 0453   BASOSABS 0.0 07/17/2014 1121   Comprehensive Metabolic Panel:    Component Value Date/Time   NA 134* 01/08/2015 0355   NA 141 07/17/2014 1121   K 3.2* 01/08/2015 0355   K 2.8* 07/17/2014 1121   CL 99* 01/08/2015 0355   CL 100 07/17/2014 1121   CO2 23 01/08/2015 0355   CO2 32 07/17/2014 1121   BUN 57* 01/08/2015 0355   BUN 9 07/17/2014 1121   CREATININE 6.13* 01/08/2015 0355  CREATININE 1.09 07/17/2014 1121   GLUCOSE 117* 01/08/2015 0355   GLUCOSE 105* 07/17/2014 1121   CALCIUM 7.3* 01/08/2015 0355   CALCIUM 8.9 07/17/2014 1121   AST 26 12/21/2014 0629   AST 17 07/17/2014 1121   ALT 23 12/21/2014 0629   ALT 12* 07/17/2014 1121   ALKPHOS 216* 12/21/2014 0629   ALKPHOS 79 07/17/2014 1121   BILITOT 0.8 12/21/2014 0629   BILITOT 0.8 07/17/2014 1121   PROT 6.3* 12/21/2014 0629   PROT 7.3 07/17/2014 1121   ALBUMIN 1.9* 12/28/2014 0531   ALBUMIN 3.1* 07/17/2014 1121    IMPRESSION: Mantle Cell Lymphoma --Managed by Dr. Mike Gip with salvage chemo last given over 5 weeks ago --Prognosis is grim -- and there are no plans to resume chemo or refer for BM transplant  --Has been transfused for anemia AND is requiring transfusion again today for Hgb 6.1 --getting EPO per nephro (with  hematology/ oncology approval)  --Transfused platelets but had reaction so needs pretreatment for transfusions in the future.  --Thrombocytopenia has resolved  Ongoing infections --Sepsis and UTI (klebsiella UTI and Bacteremia dn Staph Bacteremia) --Fungemia requiring Mycofungin  --Now with positive cx of pulled HD catheter access line (2 different GNR) --ID involved with note today stating the following:   Fungemia - C parapsilosis again growing 1/1 from 7/21. HD catheter out. Repeat 7/24 ngtd.  Depending on goals of care discussion, would do TEE if full care desired. If minimizing interventions, treatments, could just use fluconazole 400 mg oral daily for 4-6 weeks total from 7/24 without searching for focus of persistent infection. GNR on cath tip. - no positive cultures, does not indicate need for treatment with antibacterials. Continue to observe off of antibiotics.  ESRD  --first had acute renal failure due to ATN --started on Hemodialysis  --now felt to have ESRD --HD access line pulled due to ongoing bacteremia/ infections  Secondary Hyperparathyroidism with PTH 126 and Phosphorus around 2.3  Superficial Thrombus left arm --Eliquis 2.5 mg bid   Longlasting colonic Ileus --had associated GI bleed initially  --got rectal tube and miralax --not a surgical candidate --improved at first with NG to suction --failed to improve  until TPN was stopped -- since then, some improvement is noted  Anorexia, loss of appetite, severe malnutrition due to presence of cancer/ lymphoma --not eating enough to meat minimal calorie intake needs --has orders to be assisted with feedings --on Marinol --requires others to order meals and family is encouraged to bring in outside food that he likes --Nutritionist note today states: 1) Coordination of Care: discussed nutritional poc with MD Posey Pronto; calorie count completed yesterday; Per Calorie Count, took Orange Juice and some Mighty  Shake at Breakfast (sips of Nepro mid morning); 1/4 of sandwich at lunch with bites of soup and 0% at dinner. PO intake remains poor as previously documented; pt unable to tell writer what he would like to eat or why he is not eating. MD ok with discontinuing calorie count at present. As previously documented, noted family/pt do not want to feeding tube placed. Receiving supplements, on appetite stimulant, being assisted at meal times. Will continue to follow but will not see pt daily unless consulted.  2) Meals and Snacks: Cater to patient preferences; pt unable to tell writer anything he wants to eat. Pt is on a Regular Diet; may order whatever he likes. Family may place orders for pt as well; family may bring in outside food for pt  3) Medical Food Supplement Therapy:  continue all supplements [employer], [employer], Nepro) at present despite pt reporting at times to nsg staff that he does not like them and does not want to drink them; discussed with pt this AM as pt drank 100% of mighty shake at breakfast and reports its ok.   Hypernatremia --resolvied  GI bleed --high risk due to low platelets --NG tube to suction --PPI  Probable ischemic cardiomyopathy --latest echo shows EF 40-45% --last echo showed EF 35% --Severe AR and Moderate MR   PLAN:  Meeting with Dr. Mike Gip and myself tomorrow with son at 12:30 pm.  I spoke today with Gretta Began, who was present today in the room. He says he can be present for a meeting tomorrow with Dr. Mike Gip and myself --but his sister won't be able to be present. He says he can call/ skype his sister if he needs to.  I informed him that the focus will be to address how the patient's lymphoma is doing and how that affects everything else going on.  I have been made aware that mentioning the possibility of the patient not recovering may not be something that the son is receptive to hearing.  There are few options other than Hospice at this  point, since he is not stabilizing long enough to have a lower level of care, and since he continues to decline and not eat.  The son had at one time mentioned that he would like his father to be on pill antibiotics AT HOME.  Pt is total care, but I imagine that he could go to an alternate level of care on oral ABX --but continuing ABX and HD while he is not eating and while his cancer is progressing seems to be a plan of care that does not make sense.    REFERRALS TO BE ORDERED:  None as yet   More than 50% of the visit was spent in counseling/coordination of care: YES  Time Spent: 40 min

## 2015-01-08 NOTE — Consult Note (Signed)
Palliative Medicine Inpatient Consult Follow Up Note   Name: Caleb Francis Date: 01/08/2015 MRN: [id number]  DOB: 1945/08/25  Referring Physician: Max Sane, MD  Palliative Care consult requested for this 69 y.o. male for goals of medical therapy in patient with Mantle Cell Lymphoma, ESRD, numerous ongoing infections, weakness, and poor intake.     CODE STATUS: DNR   PAST MEDICAL HISTORY: Past Medical History  Diagnosis Date  . Arthritis   . Hypertension   . RA (rheumatoid arthritis)   . Anemia   . Mantle cell lymphoma     Dr Mike Gip  . History of nuclear stress test     a. 12/2013: low risk, no sig ischemia, no EKG changes, no artifact, EF 63%  . Chronic kidney disease   . Sepsis     Dr Ola Spurr    PAST SURGICAL HISTORY:  Past Surgical History  Procedure Laterality Date  . Back surgery    . Fracture surgery      ankle  . Portacath placement    . Cystoscopy w/ ureteral stent placement Left 10/24/2014    Procedure: CYSTOSCOPY WITH RETROGRADE PYELOGRAM/URETERAL STENT PLACEMENT;  Surgeon: Irine Seal, MD;  Location: [facility];  Service: Urology;  Laterality: Left;  . Peripheral vascular catheterization N/A 01/02/2015    Procedure: Porta Cath Removal;  Surgeon: Algernon Huxley, MD;  Location: [facility];  Service: Cardiovascular;  Laterality: N/A;    Vital Signs: BP 100/57 mmHg  Pulse 98  Temp(Src) 98.5 F (36.9 C) (Oral)  Resp 18  Ht 6' (1.829 m)  Wt 72.303 kg (159 lb 6.4 oz)  BMI 21.61 kg/m2  SpO2 98% Filed Weights   01/06/15 0431 01/07/15 0433 01/08/15 0414  Weight: 71.079 kg (156 lb 11.2 oz) 72.576 kg (160 lb) 72.303 kg (159 lb 6.4 oz)    Estimated body mass index is 21.61 kg/(m^2) as calculated from the following:   Height as of this encounter: 6' (1.829 m).   Weight as of this encounter: 72.303 kg (159 lb 6.4 oz).  PHYSICAL EXAM: NAD Weak OP clear No JVD Hrt rrr no mgr Lungs with decreased bs in bases , no wheezing Abd soft and NT Skin  warm and dry He is awake but lethargic  LABS: CBC:    Component Value Date/Time   WBC 19.3* 01/08/2015 0355   WBC 6.4 07/17/2014 1121   HGB 6.1* 01/08/2015 0355   HGB 11.8* 07/17/2014 1121   HCT 18.8* 01/08/2015 0355   HCT 35.0* 07/17/2014 1121   PLT 131* 01/08/2015 0355   PLT 263 07/17/2014 1121   MCV 86.4 01/08/2015 0355   MCV 83 07/17/2014 1121   NEUTROABS 7.9* 01/01/2015 0453   NEUTROABS 3.4 07/17/2014 1121   LYMPHSABS 1.4 01/01/2015 0453   LYMPHSABS 1.9 07/17/2014 1121   MONOABS 1.1* 01/01/2015 0453   MONOABS 0.8 07/17/2014 1121   EOSABS 0.7 01/01/2015 0453   EOSABS 0.2 07/17/2014 1121   BASOSABS 0.0 01/01/2015 0453   BASOSABS 0.0 07/17/2014 1121   Comprehensive Metabolic Panel:    Component Value Date/Time   NA 134* 01/08/2015 0355   NA 141 07/17/2014 1121   K 3.2* 01/08/2015 0355   K 2.8* 07/17/2014 1121   CL 99* 01/08/2015 0355   CL 100 07/17/2014 1121   CO2 23 01/08/2015 0355   CO2 32 07/17/2014 1121   BUN 57* 01/08/2015 0355   BUN 9 07/17/2014 1121   CREATININE 6.13* 01/08/2015 0355   CREATININE 1.09 07/17/2014 1121  GLUCOSE 117* 01/08/2015 0355   GLUCOSE 105* 07/17/2014 1121   CALCIUM 7.3* 01/08/2015 0355   CALCIUM 8.9 07/17/2014 1121   AST 26 12/21/2014 0629   AST 17 07/17/2014 1121   ALT 23 12/21/2014 0629   ALT 12* 07/17/2014 1121   ALKPHOS 216* 12/21/2014 0629   ALKPHOS 79 07/17/2014 1121   BILITOT 0.8 12/21/2014 0629   BILITOT 0.8 07/17/2014 1121   PROT 6.3* 12/21/2014 0629   PROT 7.3 07/17/2014 1121   ALBUMIN 1.9* 12/28/2014 0531   ALBUMIN 3.1* 07/17/2014 1121    IMPRESSION: Mantle Cell Lymphoma --Managed by Dr. Mike Gip with salvage chemo last given over 5 weeks ago --Prognosis is very poor and there are no plans to resume chemo or refer for BM transplant  --GCSF DCd  --continues on Neupogen when needed --Has been transfused for anemia --getting EPO with hematology/ oncology approval as per nephrologist --Transfused platelets  but had reaction so needs pretreatment for transfusions in the future.  --Thrombocytopenia has resolved  Ongoing infections --Sepsis and UTI (klebsiella UTI and Bacteremia dn Staph Bacteremia) --Fungemia requiring Mycofungin  --Now with positive cx of pulled HD catheter access line (2 different GNR)  ESRD  --first had acute renal failure due to ATN --started on Hemodialysis  --now felt to have ESRD --HD access line pulled due to ongoing bacteremia/ infections  Secondary Hyperparathyroidism with PTH 126 and Phosphorus around 2.3  Superficial Thrombus left arm --Eliquis 2.5 mg bid   Longlasting colonic Ileus --had associated GI bleed initially  --got rectal tube and miralax --not a surgical candidate --improved at first with NG to suction --then failed to improve further until TPN was stopped and since then, some improvement is noted  Anorexia, loss of appetite, severe malnutrition --not eating enough to meat minimal calorie intake needs --has orders to be assisted with feedings --on Marinol --requires others to order meals and family is encouraged to bring in outside food that he likes  Hypernatremia --resolvied  GI bleed --high risk due to low platelets --NG tube to suction --PPI  Probable ischemic cardiomyopathy --last echo showed EF 35% --Severe AR and Moderate MR   PLAN: I am aware that his doctors and others had a family meeting just this past Friday.  Apparently, at that time, the focus was on his poor prognosis due to poor intake.  There was, as far as i can tell, no decision made to alter the goals of care (aggressive goals of recovery) following that meeting. It was communicated that he was not a SNF appropriate patient due to his poor mobility and poor intake.  And it was confirmed that family does not desire for pt to have a PEG tube placed.     I am told that the patient's son has been resistant to any concept of the patient failing to improve (based on  report of one conversation between case mgr and son).  I had spoken with both the son and the daughter a few weeks ago and did not find them to be inclined to withdraw care at any point --mostly because he had 'rallied' just prior to going to [facility], and now they feel he will again bounce back.  I have talked with Dr. Mike Gip, who would be glad to be involved in a conference with the family.  She can be available on Thursday at around 12:30 --if we can get family together at that time.  There are no options that give [place] hope given that his lymphoma  is likely worsening and he is not a candidate at this time for further chemo.  No family was in room when I saw pt.   REFERRALS TO BE ORDERED:  None as yet   More than 50% of the visit was spent in counseling/coordination of care: YES  Time Spent: 45 min

## 2015-01-08 NOTE — Progress Notes (Signed)
Subjective:  Patient awake and alert.  Last hemodialysis treatment on Saturday 7/23 UOP not accurate since foley removed. Creatinine and BUN rising.  Cath tip with gram negative rods, ID on board.   Objective:  Vital signs in last 24 hours:  Temp:  [97.9 F (36.6 C)-100 F (37.8 C)] 98.2 F (36.8 C) (07/27 0756) Pulse Rate:  [93-103] 103 (07/27 0756) Resp:  [16-21] 18 (07/27 0756) BP: (73-119)/(50-71) 100/55 mmHg (07/27 0756) SpO2:  [99 %-100 %] 99 % (07/27 0756) Weight:  [72.303 kg (159 lb 6.4 oz)] 72.303 kg (159 lb 6.4 oz) (07/27 0414)  Weight change: -0.272 kg (-9.6 oz) Filed Weights   01/06/15 0431 01/07/15 0433 01/08/15 0414  Weight: 71.079 kg (156 lb 11.2 oz) 72.576 kg (160 lb) 72.303 kg (159 lb 6.4 oz)    Intake/Output: I/O last 3 completed shifts: In: 829 [P.O.:720; I.V.:200] Out: 1850 [Urine:1850]   Intake/Output this shift:     Physical Exam: General: chronically ill appearing  Head: oral mucosa moist  Eyes: Anicteric  Neck: Supple, trachea midline  Lungs:  Clear to auscultation normal effort  Heart: S1S2 no rubs  Abdomen:  Soft, NTND, BS present  Extremities: No LE edema  Neurologic: Awake, alert, oriented to self and place, follows commands  Skin: No acute rashes     Access: none    Basic Metabolic Panel:  Recent Labs Lab 01/02/15 0453 01/02/15 1303 01/03/15 0444 01/04/15 0418 01/06/15 1307 01/07/15 0033 01/07/15 0546 01/08/15 0355  NA 136  --  140 134*  --   --   --  134*  K 2.8*  --  2.9* 3.2* 2.2* 2.8*  --  3.2*  CL 100*  --  100* 97*  --   --   --  99*  CO2 26  --  28 23  --   --   --  23  GLUCOSE 98  --  105* 118*  --   --   --  117*  BUN 49*  --  35* 45*  --   --   --  57*  CREATININE 5.48*  --  4.75* 5.61*  --   --   --  6.13*  CALCIUM 6.9*  --  7.4* 7.1*  --   --   --  7.3*  MG  --   --   --   --   --   --  1.8  --   PHOS  --  2.3*  --   --   --   --   --   --     Liver Function Tests: No results for input(s): AST, ALT,  ALKPHOS, BILITOT, PROT, ALBUMIN in the last 168 hours. No results for input(s): LIPASE, AMYLASE in the last 168 hours. No results for input(s): AMMONIA in the last 168 hours.  CBC:  Recent Labs Lab 01/02/15 0453 01/03/15 0444 01/04/15 0418 01/08/15 0355  WBC 14.5* 16.2* 17.5* 19.3*  HGB 7.4* 7.0* 7.1* 6.1*  HCT 22.2* 21.2* 21.9* 18.8*  MCV 84.4 85.1 86.1 86.4  PLT 66* 88* 122* 131*    Cardiac Enzymes: No results for input(s): CKTOTAL, CKMB, CKMBINDEX, TROPONINI in the last 168 hours.  BNP: Invalid input(s): POCBNP  CBG:  Recent Labs Lab 01/07/15 0727 01/07/15 1403 01/07/15 2007 01/08/15 0214 01/08/15 0731  GLUCAP 86 172* 183* 120* 109*    Microbiology: Results for orders placed or performed during the hospital encounter of 11/29/14  MRSA PCR Screening  Status: None   Collection Time: 11/29/14 12:13 PM  Result Value Ref Range Status   MRSA by PCR NEGATIVE NEGATIVE Final    Comment:        The GeneXpert MRSA Assay (FDA approved for NASAL specimens only), is one component of a comprehensive MRSA colonization surveillance program. It is not intended to diagnose MRSA infection nor to guide or monitor treatment for MRSA infections.   Culture, blood (routine x 2)     Status: None   Collection Time: 11/29/14  1:19 PM  Result Value Ref Range Status   Specimen Description BLOOD  Final   Special Requests Immunocompromised  Final   Culture NO GROWTH 5 DAYS  Final   Report Status 12/04/2014 FINAL  Final  Culture, blood (routine x 2)     Status: None   Collection Time: 11/29/14  3:04 PM  Result Value Ref Range Status   Specimen Description Blood  Final   Special Requests Immunocompromised  Final   Report Status 01/03/2015 FINAL  Final  Urine culture     Status: None   Collection Time: 11/30/14 10:12 AM  Result Value Ref Range Status   Specimen Description URINE, CATHETERIZED  Final   Special Requests Immunocompromised  Final   Culture NO GROWTH 2 DAYS   Final   Report Status 12/02/2014 FINAL  Final  Culture, blood (routine x 2)     Status: None   Collection Time: 12/06/14  8:08 PM  Result Value Ref Range Status   Specimen Description BLOOD  Final   Special Requests NONE  Final   Culture  Setup Time   Final    GRAM POSITIVE COCCI IN BOTH AEROBIC AND ANAEROBIC BOTTLES CRITICAL RESULT CALLED TO, READ BACK BY AND VERIFIED WITH: Caleb Francis AT 6712 12/08/14.PMH CONFIRMED BY RWW    Culture   Final    STAPHYLOCOCCUS AURICULARIS IN BOTH AEROBIC AND ANAEROBIC BOTTLES    Report Status 12/11/2014 FINAL  Final   Organism ID, Bacteria STAPHYLOCOCCUS AURICULARIS  Final      Susceptibility   Staphylococcus auricularis - MIC*    CIPROFLOXACIN >=8 RESISTANT Resistant     ERYTHROMYCIN >=8 RESISTANT Resistant     GENTAMICIN <=0.5 SENSITIVE Sensitive     OXACILLIN >=4 RESISTANT Resistant     TETRACYCLINE <=1 SENSITIVE Sensitive     VANCOMYCIN 1 SENSITIVE Sensitive     CLINDAMYCIN <=0.25 SENSITIVE Sensitive     TRIMETH/SULFA Value in next row Sensitive      SENSITIVE<=20    LEVOFLOXACIN Value in next row Intermediate      INTERMEDIATE4    * STAPHYLOCOCCUS AURICULARIS  Culture, blood (routine x 2)     Status: None   Collection Time: 12/06/14  8:40 PM  Result Value Ref Range Status   Specimen Description BLOOD  Final   Special Requests NONE  Final   Culture NO GROWTH 5 DAYS  Final   Report Status 12/11/2014 FINAL  Final  Culture, blood (routine x 2)     Status: None   Collection Time: 12/08/14 11:40 AM  Result Value Ref Range Status   Specimen Description BLOOD  Final   Special Requests Normal  Final   Culture NO GROWTH 6 DAYS  Final   Report Status 12/14/2014 FINAL  Final  Culture, blood (routine x 2)     Status: None   Collection Time: 12/08/14 11:49 AM  Result Value Ref Range Status   Specimen Description BLOOD  Final   Special Requests  Normal  Final   Culture NO GROWTH 6 DAYS  Final   Report Status 12/14/2014 FINAL  Final  C  difficile quick scan w PCR reflex ([facility] only)     Status: None   Collection Time: 12/14/14 10:11 AM  Result Value Ref Range Status   C Diff antigen NEGATIVE  Final   C Diff toxin NEGATIVE  Final   C Diff interpretation Negative for C. difficile  Final  Culture, blood (routine x 2)     Status: None   Collection Time: 12/27/14 11:28 AM  Result Value Ref Range Status   Specimen Description BLOOD  Final   Special Requests Immunocompromised  Final   Culture  Setup Time   Final    YEAST AEROBIC BOTTLE ONLY CRITICAL RESULT CALLED TO, READ BACK BY AND VERIFIED WITH: Caleb Francis ON 12/29/14 AT 1000AM BY JEF    Culture CANDIDA PARAPSILOSIS AEROBIC BOTTLE ONLY   Final   Report Status 01/02/2015 FINAL  Final  Culture, blood (routine x 2)     Status: None   Collection Time: 12/27/14 11:36 AM  Result Value Ref Range Status   Specimen Description BLOOD  Final   Special Requests Immunocompromised  Final   Culture  Setup Time   Final    YEAST AEROBIC BOTTLE ONLY CRITICAL RESULT CALLED TO, READ BACK BY AND VERIFIED WITH: Caleb Francis AT 0111 ON 12/29/14 RWW CONFIRMED BY [place]    Culture CANDIDA PARAPSILOSIS AEROBIC BOTTLE ONLY   Final   Report Status 01/02/2015 FINAL  Final  Cath Tip Culture     Status: None   Collection Time: 12/27/14  4:17 PM  Result Value Ref Range Status   Specimen Description CATH TIP  Final   Special Requests NONE  Final   Culture NO GROWTH 2 DAYS  Final   Report Status 12/30/2014 FINAL  Final  Culture, blood (routine x 2)     Status: None   Collection Time: 12/29/14 12:28 PM  Result Value Ref Range Status   Specimen Description BLOOD RIGHT ARM  Final   Special Requests   Final    BOTTLES DRAWN AEROBIC AND ANAEROBIC  AER 4CC ANA 3CC   Culture  Setup Time   Final    BUDDING YEAST SEEN AEROBIC BOTTLE ONLY CRITICAL RESULT CALLED TO, READ BACK BY AND VERIFIED WITH: Caleb Francis AT 2230 01/01/15.TSH CONFIRMED BY SDR    Culture CANDIDA PARAPSILOSIS AEROBIC BOTTLE  ONLY   Final   Report Status 01/04/2015 FINAL  Final  Culture, blood (routine x 2)     Status: None   Collection Time: 12/29/14 12:28 PM  Result Value Ref Range Status   Specimen Description BLOOD RIGHT FATTY CASTS  Final   Special Requests BOTTLES DRAWN AEROBIC AND ANAEROBIC  5CC  Final   Culture  Setup Time   Final    YEAST AEROBIC BOTTLE ONLY CRITICAL VALUE NOTED.  VALUE IS CONSISTENT WITH PREVIOUSLY REPORTED AND CALLED VALUE.    Culture CANDIDA PARAPSILOSIS  Final   Report Status 01/04/2015 FINAL  Final  Cath Tip Culture     Status: None   Collection Time: 01/02/15 12:42 PM  Result Value Ref Range Status   Specimen Description CATH TIP  Final   Special Requests Normal  Final   Culture NO GROWTH 3 DAYS  Final   Report Status 01/05/2015 FINAL  Final  Culture, blood (routine x 2)     Status: None   Collection Time: 01/02/15  6:34 PM  Result Value Ref Range Status   Specimen Description BLOOD RIGHT HAND  Final   Special Requests 5 BOTTLES DRAWN AEROBIC AND ANAEROBIC 5ML  Final   Culture  Setup Time   Final    BUDDING YEAST SEEN ANAEROBIC BOTTLE ONLY CRITICAL RESULT CALLED TO, READ BACK BY AND VERIFIED WITH: Caleb Francis AT 2030 01/04/15 SDR  CONFIRMED BY RW    Culture CANDIDA PARAPSILOSIS  Final   Report Status 01/07/2015 FINAL  Final  Culture, blood (routine x 2)     Status: None (Preliminary result)   Collection Time: 01/05/15  8:59 AM  Result Value Ref Range Status   Specimen Description BLOOD RIGHT ASSIST CONTROL  Final   Special Requests BOTTLES DRAWN AEROBIC AND ANAEROBIC 1ML  Final   Culture NO GROWTH 2 DAYS  Final   Report Status PENDING  Incomplete  Culture, blood (routine x 2)     Status: None (Preliminary result)   Collection Time: 01/05/15  9:10 AM  Result Value Ref Range Status   Specimen Description BLOOD LEFT HAND  Final   Special Requests BOTTLES DRAWN AEROBIC AND ANAEROBIC 1ML  Final   Culture NO GROWTH 2 DAYS  Final   Report Status PENDING  Incomplete   Cath Tip Culture     Status: None (Preliminary result)   Collection Time: 01/05/15 11:30 AM  Result Value Ref Range Status   Specimen Description CATH TIP  Final   Special Requests NONE  Final   Culture   Final    GRAM NEGATIVE RODS 2 DIFFERENT GRAM NEGATIVE RODS PRESENT  IDENTIFICATION TO FOLLOW SUSCEPTIBILITIES TO FOLLOW    Report Status PENDING  Incomplete    Coagulation Studies:  Recent Labs  01/05/15 1300  LABPROT 15.9*  INR 1.25    Urinalysis: No results for input(s): COLORURINE, LABSPEC, PHURINE, GLUCOSEU, HGBUR, BILIRUBINUR, KETONESUR, PROTEINUR, UROBILINOGEN, NITRITE, LEUKOCYTESUR in the last 72 hours.  Invalid input(s): APPERANCEUR    Imaging: No results found.   Medications:     . amiodarone  200 mg Oral Daily  . antiseptic oral rinse  7 mL Mouth Rinse BID  . apixaban  2.5 mg Oral BID  . carvedilol  20 mg Oral Daily  . Chlorhexidine Gluconate Cloth  6 each Topical Once  . dronabinol  2.5 mg Oral BID AC  . epoetin (EPOGEN/PROCRIT) injection  14,000 Units Subcutaneous Q T,Th,Sa-HD  . feeding supplement (NEPRO CARB STEADY)  237 mL Oral BID BM  . insulin aspart  0-9 Units Subcutaneous Q6H  . micafungin (MYCAMINE) IV  100 mg Intravenous Daily  . mometasone-formoterol  2 puff Inhalation BID  . pantoprazole (PROTONIX) IV  40 mg Intravenous Q12H  . scopolamine  1 patch Transdermal Q72H   acetaminophen **OR** acetaminophen, albuterol, haloperidol lactate, lidocaine (PF), lidocaine-prilocaine, magnesium hydroxide, metoprolol, ondansetron (ZOFRAN) IV, promethazine, ziprasidone  Assessment/ Plan:  Caleb Francis is a 7 black male with progressive mantle cell lymphoma, RICE chemo in 11/2014, hx left sided hydronephrosis and hydroureter. S/p ureteral stent placement   Hospital course complicated by sepsis with Klebsiella oxytoca, hypernatremia. Now with fungemia.   1. End Stage Renal Failure:  Acute renal failure due to ATN and now without recovery. Pt has undergone  multiple dialysis sessions.  Has temporary dialysis catheter removed due to fungemia and now without access.  Urine output is nonoliguric. However creatinine and BUN rising.  Last HD treatment on 7/23 - Plan for dialysis access placement tomorrow, and dialysis to resume tomorrow.   2.  Sepsis with fungemia. A41.9, B49. Status post bacteremia with klebsiella oxytoca: completed two weeks for IV ceftazidime. Subsequently growing candida parapsilosis.  Subcutaneous port removed 01/02/15. - blood cultures from 7/21 now showing yeast despite micafungin.   - 7/24 culture gram negative rods.  - Appreciate ID input.  Currently on micafungin.   3.  Anemia of chronic kidney disease/mantle cell lymphoma:  Hemoglobin of 6.1 continue epogen 14000 units IV with HD, use OK'd by hematology/onc. Currently on hold since no HD.  - Transfuse 1 unit PRBC today.   4. Secondary Hyperparathyroidism: PTH 126. Phos 2.3.  - not on binders.    LOS: [place], [place] 7/27/20168:22 AM

## 2015-01-08 NOTE — Progress Notes (Signed)
PT Cancellation Note  Patient Details Name: Caleb Francis MRN: [id number] DOB: 01/08/1946   Cancelled Treatment:    Reason Eval/Treat Not Completed: Medical issues which prohibited therapy (see PT cancellation note for further details) Pt with decreased Hgb (6.1) and is contraindicated to participate in PT this date. Will re-attempt next available date.   Janeah Kovacich 01/08/2015, 8:48 AM Greggory Stallion, PT, DPT [phone]

## 2015-01-09 ENCOUNTER — Encounter
Admission: EM | Disposition: A | Discharge status: Skilled Nursing Facility | Payer: Self-pay | Source: Home / Self Care | Attending: Internal Medicine

## 2015-01-09 LAB — RENAL FUNCTION PANEL
ALBUMIN: 1.7 g/dL — AB (ref 3.5–5.0)
ANION GAP: 13 (ref 5–15)
BUN: 64 mg/dL — ABNORMAL HIGH (ref 6–20)
CO2: 22 mmol/L (ref 22–32)
CREATININE: 7.04 mg/dL — AB (ref 0.61–1.24)
Calcium: 7.8 mg/dL — ABNORMAL LOW (ref 8.9–10.3)
Chloride: 100 mmol/L — ABNORMAL LOW (ref 101–111)
GFR, EST AFRICAN AMERICAN: 8 mL/min — AB (ref >60)
GFR, EST NON AFRICAN AMERICAN: 7 mL/min — AB (ref >60)
Glucose, Bld: 84 mg/dL (ref 65–99)
PHOSPHORUS: 2.5 mg/dL (ref 2.5–4.6)
Potassium: 3.4 mmol/L — ABNORMAL LOW (ref 3.5–5.1)
Sodium: 135 mmol/L (ref 135–145)

## 2015-01-09 LAB — CBC
HEMATOCRIT: 20.9 % — AB (ref 40.0–52.0)
Hemoglobin: 6.9 g/dL — ABNORMAL LOW (ref 13.0–18.0)
MCH: 28.4 pg (ref 26.0–34.0)
MCHC: 33 g/dL (ref 32.0–36.0)
MCV: 86.2 fL (ref 80.0–100.0)
Platelets: 117 10*3/uL — ABNORMAL LOW (ref 150–440)
RBC: 2.43 MIL/uL — ABNORMAL LOW (ref 4.40–5.90)
RDW: 18.5 % — AB (ref 11.5–14.5)
WBC: 17.1 10*3/uL — ABNORMAL HIGH (ref 3.8–10.6)

## 2015-01-09 LAB — GLUCOSE, CAPILLARY
GLUCOSE-CAPILLARY: 87 mg/dL (ref 65–99)
Glucose-Capillary: 102 mg/dL — ABNORMAL HIGH (ref 65–99)
Glucose-Capillary: 81 mg/dL (ref 65–99)
Glucose-Capillary: 104 mg/dL — ABNORMAL HIGH (ref 65–99)

## 2015-01-09 LAB — MAGNESIUM: Magnesium: 1.7 mg/dL (ref 1.7–2.4)

## 2015-01-09 SURGERY — DIALYSIS/PERMA CATHETER INSERTION
Anesthesia: Moderate Sedation

## 2015-01-09 MED ORDER — POTASSIUM CHLORIDE CRYS ER 20 MEQ PO TBCR
40.0000 meq | EXTENDED_RELEASE_TABLET | Freq: Once | ORAL | Status: AC
  Administered 2015-01-09: 40 meq via ORAL
  Filled 2015-01-09: qty 2

## 2015-01-09 NOTE — Progress Notes (Signed)
Physical Therapy Treatment Patient Details Name: Caleb Francis MRN: [id number] DOB: [birth year] Today's Date: 01/09/2015    History of Present Illness Pt is a 68 y.o. 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  Temporary R dialysis catheter placed in R groin 12/17/14 (removed and replaced 7/17 d/t infection/fevers) and currently receiving dialysis.  Pt was in ICU but currently on floor.  Pt with recent admiission 11/08/14-11/19/14 for sepsis and AMS d/t UTI.  Pt went to [facility] prior to re-admission. Temp HD cath removed 01/05/15. Will place catheter 01/09/15, per chart.    PT Comments    Consulted with MD prior to treatment regarding low hemoglobin, given approval for mobility with pt. Pt progressing towards goals this date. Pt is now able to practice transfers and is increasing exercise tolerance. He will continue to benefit from skilled PT in order to improve strength and activity tolerance deficits. Pt still motivated to participate in therapy.   Follow Up Recommendations  SNF     Equipment Recommendations  Other (comment)    Recommendations for Other Services       Precautions / Restrictions Restrictions Weight Bearing Restrictions: No    Mobility  Bed Mobility               General bed mobility comments: Pt seated in recliner already. Bed mobility not assessed this session   Transfers Overall transfer level: Needs assistance Equipment used: Rolling walker (2 wheeled) Transfers: Sit to/from Stand Sit to Stand: Max assist;+2 physical assistance         General transfer comment: Pt requires assist for bodyweight support as well as tactile cues on posterior hips to get hips under him and stand tall.   Ambulation/Gait             General Gait Details: Not safe to attempt this date   Stairs            Wheelchair Mobility     Modified Rankin (Stroke Patients Only)       Balance           Standing balance support: Bilateral upper extremity supported Standing balance-Leahy Scale: Poor Standing balance comment: Pt requires max assist with RW to balance, including tactile cues for correction of posterior lean.                      Cognition Arousal/Alertness: Awake/alert Behavior During Therapy: WFL for tasks assessed/performed Overall Cognitive Status: Within Functional Limits for tasks assessed                      Exercises Other Exercises Other Exercises: Pt performed bilateral LE exercise x15. He required min assist for faciliation of movement. Repetitions have increased this date and pt is motivated to perform ther-ex. Exercises included: ankle pumps, quad sets, glute sets, heel slides, SLR, hip abd/add, LUE avoided secondary to thrombosis   Other Exercises: Pt performed 3 sit-to-stands from recliner with max assist +2. He required breaks inbetween but did not report any lightheadedness during standing for any of the 3, just weakness. Preliminary BP prior to standing activity: 103/67     General Comments        Pertinent Vitals/Pain Pain Assessment: No/denies pain    Home Living  Prior Function            PT Goals (current goals can now be found in the care plan section) Acute Rehab PT Goals Patient Stated Goal: to try to stand  PT Goal Formulation: With patient Time For Goal Achievement: 01/21/15 Potential to Achieve Goals: Fair Progress towards PT goals: Progressing toward goals    Frequency  Min 2X/week    PT Plan Current plan remains appropriate    Co-evaluation             End of Session Equipment Utilized During Treatment: Gait belt Activity Tolerance: Patient tolerated treatment well Patient left: in chair;with chair alarm set;with call bell/phone within reach     Time: 6440-3474 PT Time Calculation (min) (ACUTE  ONLY): 24 min  Charges:                       G CodesJanyth Contes 01/27/2015, 1:05 PM  Janyth Contes, SPT. [phone]

## 2015-01-09 NOTE — Progress Notes (Signed)
Subjective:  Patient awake and alert. Has been up to a chair today.  Last hemodialysis treatment on Saturday 7/23  Objective:  Vital signs in last 24 hours:  Temp:  [97.3 F (36.3 C)-99.1 F (37.3 C)] 99.1 F (37.3 C) (07/28 0443) Pulse Rate:  [98-100] 100 (07/28 0443) Resp:  [16-18] 18 (07/28 0443) BP: (100-110)/(54-67) 103/67 mmHg (07/28 0443) SpO2:  [96 %-100 %] 100 % (07/28 0443) Weight:  [72.122 kg (159 lb)] 72.122 kg (159 lb) (07/28 0443)  Weight change: -0.181 kg (-6.4 oz) Filed Weights   01/07/15 0433 01/08/15 0414 01/09/15 0443  Weight: 72.576 kg (160 lb) 72.303 kg (159 lb 6.4 oz) 72.122 kg (159 lb)    Intake/Output: I/O last 3 completed shifts: In: 960 [P.O.:240; Blood:720] Out: 250 [Urine:250]   Intake/Output this shift:     Physical Exam: General: chronically ill appearing  Head: oral mucosa moist  Eyes: Anicteric  Neck: Supple, trachea midline  Lungs:  Clear to auscultation normal effort  Heart: S1S2 no rubs  Abdomen:  Soft, NTND, BS present  Extremities: No LE edema  Neurologic: Awake, alert, oriented to self and place, follows commands  Skin: No acute rashes     Access: none    Basic Metabolic Panel:  Recent Labs Lab 01/02/15 1303  01/03/15 0444 01/04/15 0418 01/06/15 1307 01/07/15 0033 01/07/15 0546 01/08/15 0355 01/09/15 0752  NA  --   --  140 134*  --   --   --  134* 135  K  --   < > 2.9* 3.2* 2.2* 2.8*  --  3.2* 3.4*  CL  --   --  100* 97*  --   --   --  99* 100*  CO2  --   --  28 23  --   --   --  23 22  GLUCOSE  --   --  105* 118*  --   --   --  117* 84  BUN  --   --  35* 45*  --   --   --  57* 64*  CREATININE  --   --  4.75* 5.61*  --   --   --  6.13* 7.04*  CALCIUM  --   < > 7.4* 7.1*  --   --   --  7.3* 7.8*  MG  --   --   --   --   --   --  1.8  --   --   PHOS 2.3*  --   --   --   --   --   --   --  2.5  < > = values in this interval not displayed.  Liver Function Tests:  Recent Labs Lab 01/09/15 0752  ALBUMIN 1.7*    No results for input(s): LIPASE, AMYLASE in the last 168 hours. No results for input(s): AMMONIA in the last 168 hours.  CBC:  Recent Labs Lab 01/03/15 0444 01/04/15 0418 01/08/15 0355 01/08/15 1443 01/09/15 0752  WBC 16.2* 17.5* 19.3*  --  17.1*  HGB 7.0* 7.1* 6.1* 7.2* 6.9*  HCT 21.2* 21.9* 18.8* 22.2* 20.9*  MCV 85.1 86.1 86.4  --  86.2  PLT 88* 122* 131*  --  117*    Cardiac Enzymes: No results for input(s): CKTOTAL, CKMB, CKMBINDEX, TROPONINI in the last 168 hours.  BNP: Invalid input(s): POCBNP  CBG:  Recent Labs Lab 01/08/15 0731 01/08/15 1403 01/08/15 1953 01/09/15 0144 01/09/15 0732  GLUCAP 109* 127* 119*  102* 81    Microbiology: Results for orders placed or performed during the hospital encounter of 11/29/14  MRSA PCR Screening     Status: None   Collection Time: 11/29/14 12:13 PM  Result Value Ref Range Status   MRSA by PCR NEGATIVE NEGATIVE Final    Comment:        The GeneXpert MRSA Assay (FDA approved for NASAL specimens only), is one component of a comprehensive MRSA colonization surveillance program. It is not intended to diagnose MRSA infection nor to guide or monitor treatment for MRSA infections.   Culture, blood (routine x 2)     Status: None   Collection Time: 11/29/14  1:19 PM  Result Value Ref Range Status   Specimen Description BLOOD  Final   Special Requests Immunocompromised  Final   Culture NO GROWTH 5 DAYS  Final   Report Status 12/04/2014 FINAL  Final  Culture, blood (routine x 2)     Status: None   Collection Time: 11/29/14  3:04 PM  Result Value Ref Range Status   Specimen Description Blood  Final   Special Requests Immunocompromised  Final   Report Status 01/03/2015 FINAL  Final  Urine culture     Status: None   Collection Time: 11/30/14 10:12 AM  Result Value Ref Range Status   Specimen Description URINE, CATHETERIZED  Final   Special Requests Immunocompromised  Final   Culture NO GROWTH 2 DAYS  Final    Report Status 12/02/2014 FINAL  Final  Culture, blood (routine x 2)     Status: None   Collection Time: 12/06/14  8:08 PM  Result Value Ref Range Status   Specimen Description BLOOD  Final   Special Requests NONE  Final   Culture  Setup Time   Final    GRAM POSITIVE COCCI IN BOTH AEROBIC AND ANAEROBIC BOTTLES CRITICAL RESULT CALLED TO, READ BACK BY AND VERIFIED WITH: JENNIFER BEZARD AT 4481 12/08/14.PMH CONFIRMED BY [facility]    Culture   Final    STAPHYLOCOCCUS AURICULARIS IN BOTH AEROBIC AND ANAEROBIC BOTTLES    Report Status 12/11/2014 FINAL  Final   Organism ID, Bacteria STAPHYLOCOCCUS AURICULARIS  Final      Susceptibility   Staphylococcus auricularis - MIC*    CIPROFLOXACIN >=8 RESISTANT Resistant     ERYTHROMYCIN >=8 RESISTANT Resistant     GENTAMICIN <=0.5 SENSITIVE Sensitive     OXACILLIN >=4 RESISTANT Resistant     TETRACYCLINE <=1 SENSITIVE Sensitive     VANCOMYCIN 1 SENSITIVE Sensitive     CLINDAMYCIN <=0.25 SENSITIVE Sensitive     TRIMETH/SULFA Value in next row Sensitive      SENSITIVE<=20    LEVOFLOXACIN Value in next row Intermediate      INTERMEDIATE4    * STAPHYLOCOCCUS AURICULARIS  Culture, blood (routine x 2)     Status: None   Collection Time: 12/06/14  8:40 PM  Result Value Ref Range Status   Specimen Description BLOOD  Final   Special Requests NONE  Final   Culture NO GROWTH 5 DAYS  Final   Report Status 12/11/2014 FINAL  Final  Culture, blood (routine x 2)     Status: None   Collection Time: 12/08/14 11:40 AM  Result Value Ref Range Status   Specimen Description BLOOD  Final   Special Requests Normal  Final   Culture NO GROWTH 6 DAYS  Final   Report Status 12/14/2014 FINAL  Final  Culture, blood (routine x 2)  Status: None   Collection Time: 12/08/14 11:49 AM  Result Value Ref Range Status   Specimen Description BLOOD  Final   Special Requests Normal  Final   Culture NO GROWTH 6 DAYS  Final   Report Status 12/14/2014 FINAL  Final  C difficile  quick scan w PCR reflex ([facility] only)     Status: None   Collection Time: 12/14/14 10:11 AM  Result Value Ref Range Status   C Diff antigen NEGATIVE  Final   C Diff toxin NEGATIVE  Final   C Diff interpretation Negative for C. difficile  Final  Culture, blood (routine x 2)     Status: None   Collection Time: 12/27/14 11:28 AM  Result Value Ref Range Status   Specimen Description BLOOD  Final   Special Requests Immunocompromised  Final   Culture  Setup Time   Final    YEAST AEROBIC BOTTLE ONLY CRITICAL RESULT CALLED TO, READ BACK BY AND VERIFIED WITH: CATHY SUMMERLIN ON 12/29/14 AT 1000AM BY JEF    Culture CANDIDA PARAPSILOSIS AEROBIC BOTTLE ONLY   Final   Report Status 01/02/2015 FINAL  Final  Culture, blood (routine x 2)     Status: None   Collection Time: 12/27/14 11:36 AM  Result Value Ref Range Status   Specimen Description BLOOD  Final   Special Requests Immunocompromised  Final   Culture  Setup Time   Final    YEAST AEROBIC BOTTLE ONLY CRITICAL RESULT CALLED TO, READ BACK BY AND VERIFIED WITH: LISA ROMERO AT 0111 ON 12/29/14 RWW CONFIRMED BY [place]    Culture CANDIDA PARAPSILOSIS AEROBIC BOTTLE ONLY   Final   Report Status 01/02/2015 FINAL  Final  Cath Tip Culture     Status: None   Collection Time: 12/27/14  4:17 PM  Result Value Ref Range Status   Specimen Description CATH TIP  Final   Special Requests NONE  Final   Culture NO GROWTH 2 DAYS  Final   Report Status 12/30/2014 FINAL  Final  Culture, blood (routine x 2)     Status: None   Collection Time: 12/29/14 12:28 PM  Result Value Ref Range Status   Specimen Description BLOOD RIGHT ARM  Final   Special Requests   Final    BOTTLES DRAWN AEROBIC AND ANAEROBIC  AER 4CC ANA 3CC   Culture  Setup Time   Final    BUDDING YEAST SEEN AEROBIC BOTTLE ONLY CRITICAL RESULT CALLED TO, READ BACK BY AND VERIFIED WITH: BROKE ROBERTSON AT 2230 01/01/15.TSH CONFIRMED BY SDR    Culture CANDIDA PARAPSILOSIS AEROBIC BOTTLE ONLY    Final   Report Status 01/04/2015 FINAL  Final  Culture, blood (routine x 2)     Status: None   Collection Time: 12/29/14 12:28 PM  Result Value Ref Range Status   Specimen Description BLOOD RIGHT FATTY CASTS  Final   Special Requests BOTTLES DRAWN AEROBIC AND ANAEROBIC  5CC  Final   Culture  Setup Time   Final    YEAST AEROBIC BOTTLE ONLY CRITICAL VALUE NOTED.  VALUE IS CONSISTENT WITH PREVIOUSLY REPORTED AND CALLED VALUE.    Culture CANDIDA PARAPSILOSIS  Final   Report Status 01/04/2015 FINAL  Final  Cath Tip Culture     Status: None   Collection Time: 01/02/15 12:42 PM  Result Value Ref Range Status   Specimen Description CATH TIP  Final   Special Requests Normal  Final   Culture NO GROWTH 3 DAYS  Final  Report Status 01/05/2015 FINAL  Final  Culture, blood (routine x 2)     Status: None   Collection Time: 01/02/15  6:34 PM  Result Value Ref Range Status   Specimen Description BLOOD RIGHT HAND  Final   Special Requests 5 BOTTLES DRAWN AEROBIC AND ANAEROBIC 5ML  Final   Culture  Setup Time   Final    BUDDING YEAST SEEN ANAEROBIC BOTTLE ONLY CRITICAL RESULT CALLED TO, READ BACK BY AND VERIFIED WITH: IRIS GUIDRY AT 2030 01/04/15 SDR  CONFIRMED BY RW    Culture CANDIDA PARAPSILOSIS  Final   Report Status 01/07/2015 FINAL  Final  Culture, blood (routine x 2)     Status: None (Preliminary result)   Collection Time: 01/05/15  8:59 AM  Result Value Ref Range Status   Specimen Description BLOOD RIGHT ASSIST CONTROL  Final   Special Requests BOTTLES DRAWN AEROBIC AND ANAEROBIC 1ML  Final   Culture NO GROWTH 3 DAYS  Final   Report Status PENDING  Incomplete  Culture, blood (routine x 2)     Status: None (Preliminary result)   Collection Time: 01/05/15  9:10 AM  Result Value Ref Range Status   Specimen Description BLOOD LEFT HAND  Final   Special Requests BOTTLES DRAWN AEROBIC AND ANAEROBIC 1ML  Final   Culture NO GROWTH 3 DAYS  Final   Report Status PENDING  Incomplete  Cath  Tip Culture     Status: None   Collection Time: 01/05/15 11:30 AM  Result Value Ref Range Status   Specimen Description CATH TIP  Final   Special Requests NONE  Final   Culture PSEUDOMONAS AERUGINOSA KLEBSIELLA OXYTOCA   Final   Report Status 01/08/2015 FINAL  Final   Organism ID, Bacteria PSEUDOMONAS AERUGINOSA  Final   Organism ID, Bacteria KLEBSIELLA OXYTOCA  Final      Susceptibility   Klebsiella oxytoca - MIC*    AMPICILLIN >=32 RESISTANT Resistant     CEFTAZIDIME <=1 SENSITIVE Sensitive     CEFAZOLIN >=64 RESISTANT Resistant     CEFTRIAXONE 8 SENSITIVE Sensitive     CIPROFLOXACIN <=0.25 SENSITIVE Sensitive     GENTAMICIN <=1 SENSITIVE Sensitive     IMIPENEM <=0.25 SENSITIVE Sensitive     TRIMETH/SULFA <=20 SENSITIVE Sensitive     PIP/TAZO Value in next row Resistant      RESISTANT>=128    * KLEBSIELLA OXYTOCA   Pseudomonas aeruginosa - MIC*    CEFTAZIDIME Value in next row Sensitive      RESISTANT>=128    CIPROFLOXACIN Value in next row Resistant      RESISTANT>=128    GENTAMICIN Value in next row Sensitive      RESISTANT>=128    IMIPENEM Value in next row Resistant      RESISTANT>=128    PIP/TAZO Value in next row Sensitive      SENSITIVE8    * PSEUDOMONAS AERUGINOSA    Coagulation Studies: No results for input(s): LABPROT, INR in the last 72 hours.  Urinalysis: No results for input(s): COLORURINE, LABSPEC, PHURINE, GLUCOSEU, HGBUR, BILIRUBINUR, KETONESUR, PROTEINUR, UROBILINOGEN, NITRITE, LEUKOCYTESUR in the last 72 hours.  Invalid input(s): APPERANCEUR    Imaging: No results found.   Medications:     . amiodarone  200 mg Oral Daily  . antiseptic oral rinse  7 mL Mouth Rinse BID  . apixaban  2.5 mg Oral BID  . carvedilol  20 mg Oral Daily  . Chlorhexidine Gluconate Cloth  6 each Topical Once  .  dronabinol  2.5 mg Oral BID AC  . epoetin (EPOGEN/PROCRIT) injection  14,000 Units Subcutaneous Q T,Th,Sa-HD  . feeding supplement (NEPRO CARB STEADY)  237  mL Oral BID BM  . insulin aspart  0-9 Units Subcutaneous Q6H  . micafungin (MYCAMINE) IV  100 mg Intravenous Daily  . mometasone-formoterol  2 puff Inhalation BID  . pantoprazole  40 mg Oral BID  . scopolamine  1 patch Transdermal Q72H   acetaminophen **OR** acetaminophen, albuterol, haloperidol lactate, lidocaine (PF), lidocaine-prilocaine, magnesium hydroxide, metoprolol, ondansetron (ZOFRAN) IV, promethazine, ziprasidone  Assessment/ Plan:  Mr. Achor is a 37 black male with progressive mantle cell lymphoma, RICE chemo in 11/2014, hx left sided hydronephrosis and hydroureter. S/p ureteral stent placement   Hospital course complicated by sepsis with Klebsiella oxytoca, hypernatremia. Now with fungemia.   1. End Stage Renal Failure:  Acute renal failure due to ATN and now without recovery. Pt has undergone multiple dialysis sessions.  Has temporary dialysis catheter removed due to fungemia and now without access.  Urine output is nonoliguric. However creatinine and BUN rising.  Last HD treatment on 7/23 - Plan for dialysis access placement today, and dialysis to resume today  2.  Sepsis with fungemia. A41.9, B49. Status post bacteremia with klebsiella oxytoca: completed two weeks for IV ceftazidime. Subsequently growing candida parapsilosis.  Subcutaneous port removed 01/02/15. - blood cultures from 7/21 now showing yeast despite micafungin.   - 7/24 culture: klebsiella and pseudomonas - Appreciate ID input.  Currently on micafungin.   3.  Anemia of chronic kidney disease/mantle cell lymphoma:  Hemoglobin of 6.9.  continue epogen 14000 units IV with HD, use OK'd by hematology/onc. Currently on hold since no HD.  - Transfused 1 unit PRBC yesterday.  - Will transfuse 1 unit PRBC today on dialysis.   4. Secondary Hyperparathyroidism: PTH 126. Phos 2.3.  - not on binders.    LOS: [place], [place] 7/28/20169:11 AM

## 2015-01-09 NOTE — Progress Notes (Signed)
   01/09/15 1015  Clinical Encounter Type  Visited With Patient  Visit Type Follow-up  Referral From Chaplain  Consult/Referral To Chaplain  Spiritual Encounters  Spiritual Needs Emotional  Visited with patient to provide company and compassionate support.  Chap. Nidia Grogan G. [place]

## 2015-01-09 NOTE — Consult Note (Signed)
Palliative Medicine Inpatient Consult Follow Up Note   Name: Caleb Francis Date: 01/09/2015 MRN: [id number]  DOB: 1946-01-01  Referring Physician: Max Sane, MD  Palliative Care consult requested for this 69 y.o. male for goals of medical therapy in patient with lymphoma, ESRD, fungemia, recurrent bactermia/ sepsis episodes, and a superficial thrombus in his left arm.  He has new positive cultures from HD catheter growing klebsiella and pseudomonas.  He no doubt still has fungemia.    PLAN: Pt was talked to by myself and by Dr. Manuella Ghazi this am. He was informed that we are running out of options to treat him and that we believe he is dying. He said he understood. He also stated, when asked, that he wants to have his children make decisions for him.  We then had a conference with the son and pts wife (they are separated and have been for many years).  Patient's daughter was on the son's cell phone during this meeting.  Social Worker, Case Mgmt, Dr. Manuella Ghazi and myself were all present as well and contributed information.   The focus was to tell them that his cancer was 'active' whether or not it is 'bigger' or the same size.  His cancer acts on the body to cause  These three 'invisible things':  1--a loss of appetite 2--death or the immune system so that infections are not fought well and antibiotics do not work well 3--blood clots  Today, his cath tip cx grew 2 GNR organisms (one which was present before).   The problem of continuing dialysis means he would need a new dialysis catheter. This would mean it would go in while he has active infections and the bacteria (and fungi) would use the catheter as a growth medium.    His daughter insisted many times during our talk, that we needed to  'clear up the infection' and then place the catheter and then start dialysis back'.  She was informed by me that I do not feel his infection will EVER clear and if it were to clear up by some miracle it would  take at least 2 weeks for that to happen and by then, he would be dead from kidney failure. She is made aware that the fungus is present because of the use of antibiotics and the poor immune system and more antibiotics may mean that this does not resolve.    She did not understand this at first, but eventually, she became very quiet as though she might have begun to grasp that we are not able to clear the infection and doing dialysis is possible --but we would be worsening his infections and not really accomplishing anything.    His son and his estranged wife seemed to understand that the patient is going to die no matter what we do. And that we simply want to be honest about the reasons why we cannot get anywhere and are running out of options.  It all comes down to the lymphoma causing many of his problems and we no longer see that we are having any effect on his problems.  His daughter wants to be here Saturday (she cannot be here till then).  I confirmed that we will do NOTHING today into tomorrow UNTIl we talk with her.  I will try to be the point person and will communicate with the other doctors.      REVIEW OF SYSTEMS:  Says he feels 'lousy'.    CODE STATUS: DNR and NO  FEEDING TUBE   PAST MEDICAL HISTORY: Past Medical History  Diagnosis Date  . Arthritis   . Hypertension   . RA (rheumatoid arthritis)   . Anemia   . Mantle cell lymphoma     Dr Mike Gip  . History of nuclear stress test     a. 12/2013: low risk, no sig ischemia, no EKG changes, no artifact, EF 63%  . Chronic kidney disease   . Sepsis     Dr Ola Spurr    PAST SURGICAL HISTORY:  Past Surgical History  Procedure Laterality Date  . Back surgery    . Fracture surgery      ankle  . Portacath placement    . Cystoscopy w/ ureteral stent placement Left 10/24/2014    Procedure: CYSTOSCOPY WITH RETROGRADE PYELOGRAM/URETERAL STENT PLACEMENT;  Surgeon: Irine Seal, MD;  Location: [facility];  Service: Urology;  Laterality:  Left;  . Peripheral vascular catheterization N/A 01/02/2015    Procedure: Porta Cath Removal;  Surgeon: Algernon Huxley, MD;  Location: [place] [facility];  Service: Cardiovascular;  Laterality: N/A;    Vital Signs: BP 100/54 mmHg  Pulse 103  Temp(Src) 97.7 F (36.5 C) (Oral)  Resp 18  Ht 6' (1.829 m)  Wt 72.122 kg (159 lb)  BMI 21.56 kg/m2  SpO2 100% Filed Weights   01/07/15 0433 01/08/15 0414 01/09/15 0443  Weight: 72.576 kg (160 lb) 72.303 kg (159 lb 6.4 oz) 72.122 kg (159 lb)    Estimated body mass index is 21.56 kg/(m^2) as calculated from the following:   Height as of this encounter: 6' (1.829 m).   Weight as of this encounter: 72.122 kg (159 lb).  PHYSICAL EXAM: Patient is alert and able to indicate he understands what we say to him.  He does not have any questions. He says he feels 'lousy' but cannot say why. Cachectic NAD EOMI OP clear Neck no JVD or TM Heart rrr no mgr Lungs cta anteriorly Abd soft and NT Ext cachectic He has a sacral ulcer.   LABS: CBC:    Component Value Date/Time   WBC 17.1* 01/09/2015 0752   WBC 6.4 07/17/2014 1121   HGB 6.9* 01/09/2015 0752   HGB 11.8* 07/17/2014 1121   HCT 20.9* 01/09/2015 0752   HCT 35.0* 07/17/2014 1121   PLT 117* 01/09/2015 0752   PLT 263 07/17/2014 1121   MCV 86.2 01/09/2015 0752   MCV 83 07/17/2014 1121   NEUTROABS 7.9* 01/01/2015 0453   NEUTROABS 3.4 07/17/2014 1121   LYMPHSABS 1.4 01/01/2015 0453   LYMPHSABS 1.9 07/17/2014 1121   MONOABS 1.1* 01/01/2015 0453   MONOABS 0.8 07/17/2014 1121   EOSABS 0.7 01/01/2015 0453   EOSABS 0.2 07/17/2014 1121   BASOSABS 0.0 01/01/2015 0453   BASOSABS 0.0 07/17/2014 1121   Comprehensive Metabolic Panel:    Component Value Date/Time   NA 135 01/09/2015 0752   NA 141 07/17/2014 1121   K 3.4* 01/09/2015 0752   K 2.8* 07/17/2014 1121   CL 100* 01/09/2015 0752   CL 100 07/17/2014 1121   CO2 22 01/09/2015 0752   CO2 32 07/17/2014 1121   BUN 64* 01/09/2015 0752    BUN 9 07/17/2014 1121   CREATININE 7.04* 01/09/2015 0752   CREATININE 1.09 07/17/2014 1121   GLUCOSE 84 01/09/2015 0752   GLUCOSE 105* 07/17/2014 1121   CALCIUM 7.8* 01/09/2015 0752   CALCIUM 8.9 07/17/2014 1121   AST 26 12/21/2014 0629   AST 17 07/17/2014 1121   ALT  23 12/21/2014 0629   ALT 12* 07/17/2014 1121   ALKPHOS 216* 12/21/2014 0629   ALKPHOS 79 07/17/2014 1121   BILITOT 0.8 12/21/2014 0629   BILITOT 0.8 07/17/2014 1121   PROT 6.3* 12/21/2014 0629   PROT 7.3 07/17/2014 1121   ALBUMIN 1.7* 01/09/2015 0752   ALBUMIN 3.1* 07/17/2014 1121    IMPRESSION: Mantle Cell Lymphoma --Managed by Dr. Mike Gip with salvage chemo last given over 5 weeks ago --Prognosis is grim -- and there are no plans to resume chemo or refer for BM transplant  --Has been transfused for anemia AND is requiring transfusion again today for Hgb 6.1 --getting EPO per nephro (with hematology/ oncology approval)  --Transfused platelets but had reaction so needs pretreatment for transfusions in the future.  --Thrombocytopenia has resolved  Ongoing infections --Sepsis and UTI (klebsiella UTI and Bacteremia dn Staph Bacteremia) --Fungemia requiring Mycofungin  --Now with positive cx of pulled HD catheter access line (2 different GNR) --ID involved with note today stating the following:  Fungemia - C parapsilosis again growing 1/1 from 7/21. HD catheter out. Repeat 7/24 ngtd.  Depending on goals of care discussion, would do TEE if full care desired. If minimizing interventions, treatments, could just use fluconazole 400 mg oral daily for 4-6 weeks total from 7/24 without searching for focus of persistent infection. GNR on cath tip. - no positive cultures, does not indicate need for treatment with antibacterials. Continue to observe off of antibiotics.  ESRD  --first had acute renal failure due to ATN --started on Hemodialysis  --now felt to have ESRD --HD access line pulled due to ongoing  bacteremia/ infections  Secondary Hyperparathyroidism with PTH 126 and Phosphorus around 2.3  Superficial Thrombus left arm --Eliquis 2.5 mg bid   Longlasting colonic Ileus --had associated GI bleed initially  --got rectal tube and miralax --not a surgical candidate --improved at first with NG to suction --failed to improve until TPN was stopped -- since then, some improvement is noted  Anorexia, loss of appetite, severe malnutrition due to presence of cancer/ lymphoma --not eating enough to meat minimal calorie intake needs --has orders to be assisted with feedings --on Marinol --requires others to order meals and family is encouraged to bring in outside food that he likes --Nutritionist note today states: 1) Coordination of Care: discussed nutritional poc with MD Posey Pronto; calorie count completed yesterday; Per Calorie Count, took Orange Juice and some Mighty Shake at Breakfast (sips of Nepro mid morning); 1/4 of sandwich at lunch with bites of soup and 0% at dinner. PO intake remains poor as previously documented; pt unable to tell writer what he would like to eat or why he is not eating. MD ok with discontinuing calorie count at present. As previously documented, noted family/pt do not want to feeding tube placed. Receiving supplements, on appetite stimulant, being assisted at meal times. Will continue to follow but will not see pt daily unless consulted.  2) Meals and Snacks: Cater to patient preferences; pt unable to tell writer anything he wants to eat. Pt is on a Regular Diet; may order whatever he likes. Family may place orders for pt as well; family may bring in outside food for pt  3) Medical Food Supplement Therapy: continue all supplements [employer], [employer], Nepro) at present despite pt reporting at times to nsg staff that he does not like them and does not want to drink them; discussed with pt this AM as pt drank 100% of mighty shake at breakfast and  reports its ok.    Hypernatremia --resolvied  GI bleed --high risk due to low platelets --NG tube to suction --PPI  Probable ischemic cardiomyopathy --latest echo shows EF 40-45% --last echo showed EF 35% --Severe AR and Moderate MR     SEE PLAN ABOVE  REFERRALS TO BE ORDERED:  None as yet.    More than 50% of the visit was spent in counseling/coordination of care: YES  Time Spent: 90 min

## 2015-01-09 NOTE — Progress Notes (Signed)
Speech Therapy Note: reviewed chart notes; consulted [place] and Dietician. Pt remains on a regular diet after family request to upgrade to such from a Dys. 2/3 diet. Pt appears to be tolerating such per NSG w/ no reports of overt s/s of aspiration during swallowing at meals/meds; lungs CTA per MD note and no recent temp. spikes. Dietician reported overall intake is still inconsistent. No further skilled ST services indicated at this time. NSG to reconsult ST services if [place]. NSG agreed.

## 2015-01-09 NOTE — Progress Notes (Signed)
[place] INFECTIOUS DISEASE PROGRESS NOTE Date of Admission:  11/29/2014     ID: Caleb Francis is a 69 y.o. male with  Candida parapsolisis bloodstream infected.  Principal Problem:   Fungemia Active Problems:   Mantle cell lymphoma   Sepsis   Thrombocytopenia   Absolute anemia   Rheumatoid arthritis with rheumatoid factor   Altered mental status   Chest pain   Metabolic encephalopathy   Ileus   Fecal impaction   Protein-calorie malnutrition, severe   Ogilvie's syndrome   Pseudo-obstruction of colon   Pressure ulcer   Acute renal insufficiency  Subjective: All lines out, still very weak. No fevers. bcx 7/24 neg  ROS  Eleven systems are reviewed and negative except per hpi  Medications:  Antibiotics Given (last 72 hours)    None     . amiodarone  200 mg Oral Daily  . antiseptic oral rinse  7 mL Mouth Rinse BID  . apixaban  2.5 mg Oral BID  . carvedilol  20 mg Oral Daily  . Chlorhexidine Gluconate Cloth  6 each Topical Once  . dronabinol  2.5 mg Oral BID AC  . epoetin (EPOGEN/PROCRIT) injection  14,000 Units Subcutaneous Q T,Th,Sa-HD  . feeding supplement (NEPRO CARB STEADY)  237 mL Oral BID BM  . micafungin (MYCAMINE) IV  100 mg Intravenous Daily  . mometasone-formoterol  2 puff Inhalation BID  . pantoprazole  40 mg Oral BID  . scopolamine  1 patch Transdermal Q72H    Objective: Vital signs in last 24 hours: Temp:  [97.7 F (36.5 C)-99.1 F (37.3 C)] 98.3 F (36.8 C) (07/28 2003) Pulse Rate:  [97-103] 97 (07/28 2005) Resp:  [18-22] 22 (07/28 2003) BP: (89-103)/(49-67) 96/59 mmHg (07/28 2041) SpO2:  [100 %] 100 % (07/28 2005) Weight:  [72.122 kg (159 lb)] 72.122 kg (159 lb) (07/28 0443) Physical Exam  Constitutional: He is awake and interactive, Chronically ill appearing.  HENT: Mouth/Throat: Oropharynx is clear and moist. No oropharyngeal exudate.  Cardiovascular: Normal rate, regular rhythm and normal heart sounds. Exam reveals no gallop and no  friction rub.  Pulmonary/Chest: Effort normal and breath sounds normal. No respiratory distress. He has no wheezes.  Abdominal: Soft. Bowel sounds are normal. He exhibits no distension. There is no tenderness.  Lymphadenopathy: He has no cervical adenopathy.  Neurological: He is alert and interactive Skin: Skin is warm and dry. No rash noted. No erythema.  Psychiatric: He has a normal mood and affect. His behavior is normal.    Lab Results  Recent Labs  01/08/15 0355 01/08/15 1443 01/09/15 0752  WBC 19.3*  --  17.1*  HGB 6.1* 7.2* 6.9*  HCT 18.8* 22.2* 20.9*  NA 134*  --  135  K 3.2*  --  3.4*  CL 99*  --  100*  CO2 23  --  22  BUN 57*  --  64*  CREATININE 6.13*  --  7.04*    Microbiology: Results for orders placed or performed during the hospital encounter of 11/29/14  MRSA PCR Screening     Status: None   Collection Time: 11/29/14 12:13 PM  Result Value Ref Range Status   MRSA by PCR NEGATIVE NEGATIVE Final    Comment:        The GeneXpert MRSA Assay (FDA approved for NASAL specimens only), is one component of a comprehensive MRSA colonization surveillance program. It is not intended to diagnose MRSA infection nor to guide or monitor treatment for MRSA infections.  Culture, blood (routine x 2)     Status: None   Collection Time: 11/29/14  1:19 PM  Result Value Ref Range Status   Specimen Description BLOOD  Final   Special Requests Immunocompromised  Final   Culture NO GROWTH 5 DAYS  Final   Report Status 12/04/2014 FINAL  Final  Culture, blood (routine x 2)     Status: None   Collection Time: 11/29/14  3:04 PM  Result Value Ref Range Status   Specimen Description Blood  Final   Special Requests Immunocompromised  Final   Report Status 01/03/2015 FINAL  Final  Urine culture     Status: None   Collection Time: 11/30/14 10:12 AM  Result Value Ref Range Status   Specimen Description URINE, CATHETERIZED  Final   Special Requests Immunocompromised  Final    Culture NO GROWTH 2 DAYS  Final   Report Status 12/02/2014 FINAL  Final  Culture, blood (routine x 2)     Status: None   Collection Time: 12/06/14  8:08 PM  Result Value Ref Range Status   Specimen Description BLOOD  Final   Special Requests NONE  Final   Culture  Setup Time   Final    GRAM POSITIVE COCCI IN BOTH AEROBIC AND ANAEROBIC BOTTLES CRITICAL RESULT CALLED TO, READ BACK BY AND VERIFIED WITH: JENNIFER BEZARD AT 2440 12/08/14.PMH CONFIRMED BY RWW    Culture   Final    STAPHYLOCOCCUS AURICULARIS IN BOTH AEROBIC AND ANAEROBIC BOTTLES    Report Status 12/11/2014 FINAL  Final   Organism ID, Bacteria STAPHYLOCOCCUS AURICULARIS  Final      Susceptibility   Staphylococcus auricularis - MIC*    CIPROFLOXACIN >=8 RESISTANT Resistant     ERYTHROMYCIN >=8 RESISTANT Resistant     GENTAMICIN <=0.5 SENSITIVE Sensitive     OXACILLIN >=4 RESISTANT Resistant     TETRACYCLINE <=1 SENSITIVE Sensitive     VANCOMYCIN 1 SENSITIVE Sensitive     CLINDAMYCIN <=0.25 SENSITIVE Sensitive     TRIMETH/SULFA Value in next row Sensitive      SENSITIVE<=20    LEVOFLOXACIN Value in next row Intermediate      INTERMEDIATE4    * STAPHYLOCOCCUS AURICULARIS  Culture, blood (routine x 2)     Status: None   Collection Time: 12/06/14  8:40 PM  Result Value Ref Range Status   Specimen Description BLOOD  Final   Special Requests NONE  Final   Culture NO GROWTH 5 DAYS  Final   Report Status 12/11/2014 FINAL  Final  Culture, blood (routine x 2)     Status: None   Collection Time: 12/08/14 11:40 AM  Result Value Ref Range Status   Specimen Description BLOOD  Final   Special Requests Normal  Final   Culture NO GROWTH 6 DAYS  Final   Report Status 12/14/2014 FINAL  Final  Culture, blood (routine x 2)     Status: None   Collection Time: 12/08/14 11:49 AM  Result Value Ref Range Status   Specimen Description BLOOD  Final   Special Requests Normal  Final   Culture NO GROWTH 6 DAYS  Final   Report Status  12/14/2014 FINAL  Final  C difficile quick scan w PCR reflex ([facility] only)     Status: None   Collection Time: 12/14/14 10:11 AM  Result Value Ref Range Status   C Diff antigen NEGATIVE  Final   C Diff toxin NEGATIVE  Final   C Diff interpretation Negative for  C. difficile  Final  Culture, blood (routine x 2)     Status: None   Collection Time: 12/27/14 11:28 AM  Result Value Ref Range Status   Specimen Description BLOOD  Final   Special Requests Immunocompromised  Final   Culture  Setup Time   Final    YEAST AEROBIC BOTTLE ONLY CRITICAL RESULT CALLED TO, READ BACK BY AND VERIFIED WITH: CATHY SUMMERLIN ON 12/29/14 AT 1000AM BY JEF    Culture CANDIDA PARAPSILOSIS AEROBIC BOTTLE ONLY   Final   Report Status 01/02/2015 FINAL  Final  Culture, blood (routine x 2)     Status: None   Collection Time: 12/27/14 11:36 AM  Result Value Ref Range Status   Specimen Description BLOOD  Final   Special Requests Immunocompromised  Final   Culture  Setup Time   Final    YEAST AEROBIC BOTTLE ONLY CRITICAL RESULT CALLED TO, READ BACK BY AND VERIFIED WITH: LISA ROMERO AT 0111 ON 12/29/14 RWW CONFIRMED BY [place]    Culture CANDIDA PARAPSILOSIS AEROBIC BOTTLE ONLY   Final   Report Status 01/02/2015 FINAL  Final  Cath Tip Culture     Status: None   Collection Time: 12/27/14  4:17 PM  Result Value Ref Range Status   Specimen Description CATH TIP  Final   Special Requests NONE  Final   Culture NO GROWTH 2 DAYS  Final   Report Status 12/30/2014 FINAL  Final  Culture, blood (routine x 2)     Status: None   Collection Time: 12/29/14 12:28 PM  Result Value Ref Range Status   Specimen Description BLOOD RIGHT ARM  Final   Special Requests   Final    BOTTLES DRAWN AEROBIC AND ANAEROBIC  AER 4CC ANA 3CC   Culture  Setup Time   Final    BUDDING YEAST SEEN AEROBIC BOTTLE ONLY CRITICAL RESULT CALLED TO, READ BACK BY AND VERIFIED WITH: BROKE ROBERTSON AT 2230 01/01/15.TSH CONFIRMED BY SDR    Culture CANDIDA  PARAPSILOSIS AEROBIC BOTTLE ONLY   Final   Report Status 01/04/2015 FINAL  Final  Culture, blood (routine x 2)     Status: None   Collection Time: 12/29/14 12:28 PM  Result Value Ref Range Status   Specimen Description BLOOD RIGHT FATTY CASTS  Final   Special Requests BOTTLES DRAWN AEROBIC AND ANAEROBIC  5CC  Final   Culture  Setup Time   Final    YEAST AEROBIC BOTTLE ONLY CRITICAL VALUE NOTED.  VALUE IS CONSISTENT WITH PREVIOUSLY REPORTED AND CALLED VALUE.    Culture CANDIDA PARAPSILOSIS  Final   Report Status 01/04/2015 FINAL  Final  Cath Tip Culture     Status: None   Collection Time: 01/02/15 12:42 PM  Result Value Ref Range Status   Specimen Description CATH TIP  Final   Special Requests Normal  Final   Culture NO GROWTH 3 DAYS  Final   Report Status 01/05/2015 FINAL  Final  Culture, blood (routine x 2)     Status: None   Collection Time: 01/02/15  6:34 PM  Result Value Ref Range Status   Specimen Description BLOOD RIGHT HAND  Final   Special Requests 5 BOTTLES DRAWN AEROBIC AND ANAEROBIC 5ML  Final   Culture  Setup Time   Final    BUDDING YEAST SEEN ANAEROBIC BOTTLE ONLY CRITICAL RESULT CALLED TO, READ BACK BY AND VERIFIED WITH: IRIS GUIDRY AT 2030 01/04/15 SDR  CONFIRMED BY RW    Culture CANDIDA PARAPSILOSIS  Final   Report Status 01/07/2015 FINAL  Final  Culture, blood (routine x 2)     Status: None (Preliminary result)   Collection Time: 01/05/15  8:59 AM  Result Value Ref Range Status   Specimen Description BLOOD RIGHT ASSIST CONTROL  Final   Special Requests BOTTLES DRAWN AEROBIC AND ANAEROBIC 1ML  Final   Culture NO GROWTH 4 DAYS  Final   Report Status PENDING  Incomplete  Culture, blood (routine x 2)     Status: None (Preliminary result)   Collection Time: 01/05/15  9:10 AM  Result Value Ref Range Status   Specimen Description BLOOD LEFT HAND  Final   Special Requests BOTTLES DRAWN AEROBIC AND ANAEROBIC 1ML  Final   Culture NO GROWTH 4 DAYS  Final   Report  Status PENDING  Incomplete  Cath Tip Culture     Status: None   Collection Time: 01/05/15 11:30 AM  Result Value Ref Range Status   Specimen Description CATH TIP  Final   Special Requests NONE  Final   Culture PSEUDOMONAS AERUGINOSA KLEBSIELLA OXYTOCA   Final   Report Status 01/08/2015 FINAL  Final   Organism ID, Bacteria PSEUDOMONAS AERUGINOSA  Final   Organism ID, Bacteria KLEBSIELLA OXYTOCA  Final      Susceptibility   Klebsiella oxytoca - MIC*    AMPICILLIN >=32 RESISTANT Resistant     CEFTAZIDIME <=1 SENSITIVE Sensitive     CEFAZOLIN >=64 RESISTANT Resistant     CEFTRIAXONE 8 SENSITIVE Sensitive     CIPROFLOXACIN <=0.25 SENSITIVE Sensitive     GENTAMICIN <=1 SENSITIVE Sensitive     IMIPENEM <=0.25 SENSITIVE Sensitive     TRIMETH/SULFA <=20 SENSITIVE Sensitive     PIP/TAZO Value in next row Resistant      RESISTANT>=128    * KLEBSIELLA OXYTOCA   Pseudomonas aeruginosa - MIC*    CEFTAZIDIME Value in next row Sensitive      RESISTANT>=128    CIPROFLOXACIN Value in next row Resistant      RESISTANT>=128    GENTAMICIN Value in next row Sensitive      RESISTANT>=128    IMIPENEM Value in next row Resistant      RESISTANT>=128    PIP/TAZO Value in next row Sensitive      SENSITIVE8    * PSEUDOMONAS AERUGINOSA    Studies/Results: No results found.  Assessment/Plan: 1. 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 Port removed 7/21, HD cath removed 3  Lymphoma 4. Ureteral Stent 5. ESRD  Recommendations Can change micafungin to fluconazole 400 IV or Po for 4 weeks from negative culture I do not think the cath tip cx is significant as not on targeted abx.  Would not treat unless bcx turns + Agree with palliative care Thank you very much for the consult. Will follow with you.  [place], [place]   01/09/2015, 9:21 PM

## 2015-01-09 NOTE — Progress Notes (Addendum)
[facility] [place] NAME: Caleb Francis    MR#:  [id number]  DATE OF BIRTH:  [birth year]  SUBJECTIVE:  About same - Very weak. Not much po intake other than few sips of mighty shakes. Pt not much motivated to do anythig due to severe deconditioning. Discussed with him for goals of care but he's not able to decide and wants family to make decision for him.  REVIEW OF SYSTEMS:   Review of Systems  Constitutional: Positive for malaise/fatigue. Negative for fever, chills and weight loss.  HENT: Negative for ear discharge, ear pain and nosebleeds.   Eyes: Negative for blurred vision, pain and discharge.  Respiratory: Negative for sputum production, shortness of breath, wheezing and stridor.   Cardiovascular: Negative for chest pain, palpitations, orthopnea and PND.  Gastrointestinal: Negative for nausea, vomiting, abdominal pain and diarrhea.  Genitourinary: Negative for urgency and frequency.  Musculoskeletal: Positive for back pain. Negative for joint pain.  Neurological: Positive for weakness. Negative for sensory change, speech change and focal weakness.  Psychiatric/Behavioral: Negative for depression. The patient is not nervous/anxious.    Tolerating Diet:very little Tolerating PT: eval pending   DRUG ALLERGIES:  No Known Allergies VITALS:  Blood pressure 100/54, pulse 103, temperature 97.7 F (36.5 C), temperature source Oral, resp. rate 18, height 6' (1.829 m), weight 72.122 kg (159 lb), SpO2 100 %. PHYSICAL EXAMINATION:   Physical Exam GENERAL:  69 y.o.-year-old patient lying in the bed with no acute distress. Appears chronically ill EYES: Pupils equal, round, reactive to light and accommodation. No scleral icterus. Extraocular muscles intact.  HEENT: Head atraumatic, normocephalic. Oropharynx and nasopharynx clear.  NECK:  Supple, no jugular venous distention. No thyroid enlargement, no tenderness.  LUNGS: Normal breath  sounds bilaterally, no wheezing, rales, rhonchi. No use of accessory muscles of respiration.  CARDIOVASCULAR: S1, S2 normal. No murmurs, rubs, or gallops.  ABDOMEN: Soft, nontender, mild distended. Bowel sounds present. No organomegaly or mass.  EXTREMITIES: No cyanosis, clubbing or edema b/l.   Right groin catheter Left arm swelling + NEUROLOGIC: Cranial nerves II through XII are intact. No focal Motor or sensory deficits b/l.  Weak,severe deconditioning PSYCHIATRIC:  patient is alert and oriented x 2.  SKIN: stage II sacral ulcer.  LABORATORY PANEL:   CBC  Recent Labs Lab 01/09/15 0752  WBC 17.1*  HGB 6.9*  HCT 20.9*  PLT 117*    Chemistries   Recent Labs Lab 01/09/15 0752  NA 135  K 3.4*  CL 100*  CO2 22  GLUCOSE 84  BUN 64*  CREATININE 7.04*  CALCIUM 7.8*  MG 1.7   ASSESSMENT AND PLAN:   #1 sepsis: - Fungemia with Candida parapsilosis blood cultures 7/15, and again on repeat culture 7/17and 7/21 Appreciate infectious disease consultation. Continue micafungin.  - Port-A-Cath removed on 7/21 by Dr. Lucky Cowboy. Cath tip neg - Bacteremia with Klebsiella oxytoca status post 2 weeks of IV ceftaz resolved - Bacteremia with coag negative Staphylococcus, treated with vancomycin, echo negative resolved - Hemodialysis catheter placed on the 17th, urethral catheter placed 26 days ago - bladder training completed  and removed foley 01/07/15 - Removed HD from right groin  01/05/15. Cath tip growing Klebsiella and Pseudomonas (resulted on 7/27) - Appreciate ID input: C parapsilosis again growing 1/1 from 7/21. HD catheter out. Repeat 7/24 ngtd.  - Palliative care and Onco to meet with family tomorrow at 12:30 to discuss goals of care: could do fluconazole 400 mg oral  daily for 4-6 weeks total from 7/24 without searching for focus of persistent infection - met with all family this afternoon with Palliative care, CM/SW and had long discussion but they're still undecided and requests  another 24 hrs - my feeling based on her sister's talk is they want everything to be done but they will confirm this tomorrow. - we have given them all options including possible best - Hospice Home.   #2 acute renal failure due to ATN-->now progressed to ESRD - Appreciate nephrology following, receiving hemodialysis - This is likely end stage renal disease, no improvement in renal function minimal urine output. He will need long-term dialysis -pt currently has no HD access -if family choose full care - will need temp cathetar to start HD  #3 Mantle cell lymphoma -recvdNeupogen, status post 2 units packed red blood cells on 6/25 and 1 unit on 7/2 - Prognosis is poor.-followed by Dr Mike Gip -at present given overall pt's decline and varioius infections not sure if pt is even a candidate for further Chemotherapy  #4 chronic ileus with h/o GI bleeding: Clinically improving. No further bleeding. NG tube and rectal tube discontinued 7/12 - Continue PPI, high risk for further bleeding due to thrombocytopenia - Last seen by GI on July 4. No further plans for endoscopy. - Persistent ileus seen on KUB 7/21, is passing watery stools.   #5 deconditioning: Unable to participate in physical therapy due to poor motivation and severe deconditioning. Spoke with PT to see if he improves  #6 protein calorie malnutrition: Continue Marinol. Encourage intake. Discussed with family and the patient they would not be interested in PEG tube placement.  -pt's calorie count now stopped due to severe poor po intake  #7 encephalopathy: Appreciate neurology consultation. This is likely encephalopathy/delirium due to critical illness. Agree with when necessary Geodon for agitation. Has not been agitated -pt is improving with his mood slowly  #8 pressure ulcers: Wound care consultation appreciated  #9 left basilic vein superficial extensive thrombosis Spoke with dr Delana Meyer and rec give eliquis 2.5 mg bid and repeat  US upper extremity in 3-4 days to ensure thrombus not extending further  Overall very unfortunate pt with progressive decline.  Await family meeting with Palliaive care on 7/27  CODE STATUS: DO NOT RESUSCITATE  Case discussed with Care Management/Social Worker. Management plans discussed with the patient, CSW, Family and Dr Megan Salon  TOTAL TIME TAKING CARE OF THIS PATIENT: 45 minutes.   More than 50% of the time was spent in counseling/coordination of care: Augustina Mood, Anikah Hogge M.D on 01/09/2015 at 5:59 PM  Between 7am to 6pm - Pager - [phone]  After 6pm go to [email or web address] - password [email or web address] [facility]  [facility]  Office  [phone]  CC: Primary care physician; No PCP Per Patient

## 2015-01-09 NOTE — Progress Notes (Signed)
In to inform patient of NPO status as dialysis catheter placement is ordered for this afternoon.  Pt states "what catheter? Dialysis, shit"  Pt appears angry.  Reassured patient.

## 2015-01-09 NOTE — Clinical Social Work Note (Signed)
CSW attended the family meeting to discuss plan of care per request of Dr. Megan Salon.  The family decided to talk to pt and make a decision about the direction of treatment by 01/10/15.  CSW will continue to follow and assist with d/c planning needs.  [place], [place]

## 2015-01-09 NOTE — Care Management (Signed)
Palliative care, attending, CSW, son Sharvil and wife Katharine Look met.  PAtient's daughter [name] was on speaker phone.  Discussed that prognosis is poor- would further compromise the patient to have another dialysis catheter inserted (last last one inserted and removed was + for bacteria). Patient unable to fight the continuous infections due to lack of immunity from the lymphoma, inserting a new dialysis catheter before systemic infection clears provides another host for infections and if try to clear infection before inserting dialysis cath, will most likely die from renal failure.  FAmily wants to speak with patient and amongst themselves to make a decision.  Initially, daugther verbalized it would be her wish to pursue dialysis.  Anticipate family will meet with Dr Megan Salon  and discuss  decision regarding how plan will proceed 7/29.  Primary nurse reports that patient is alert and oriented today and when he was informed that another dialysis catheter was being considered, he became angry.  Family says that patient would not be able to return home under hospice care due to the lack of available caregivers.  Briefly discussed hospice home and offered discussion with a representive of hospice home and or tour the facility

## 2015-01-10 ENCOUNTER — Encounter
Admission: EM | Disposition: A | Discharge status: Skilled Nursing Facility | Payer: Self-pay | Source: Home / Self Care | Attending: Internal Medicine

## 2015-01-10 HISTORY — PX: PERIPHERAL VASCULAR CATHETERIZATION: SHX172C

## 2015-01-10 LAB — RENAL FUNCTION PANEL
ALBUMIN: 1.6 g/dL — AB (ref 3.5–5.0)
ANION GAP: 13 (ref 5–15)
BUN: 68 mg/dL — AB (ref 6–20)
CALCIUM: 7.6 mg/dL — AB (ref 8.9–10.3)
CHLORIDE: 98 mmol/L — AB (ref 101–111)
CO2: 22 mmol/L (ref 22–32)
Creatinine, Ser: 7.11 mg/dL — ABNORMAL HIGH (ref 0.61–1.24)
GFR calc non Af Amer: 7 mL/min — ABNORMAL LOW (ref >60)
GFR, EST AFRICAN AMERICAN: 8 mL/min — AB (ref >60)
Glucose, Bld: 101 mg/dL — ABNORMAL HIGH (ref 65–99)
PHOSPHORUS: 3.2 mg/dL (ref 2.5–4.6)
POTASSIUM: 3.2 mmol/L — AB (ref 3.5–5.1)
SODIUM: 133 mmol/L — AB (ref 135–145)

## 2015-01-10 LAB — CULTURE, BLOOD (ROUTINE X 2)
CULTURE: NO GROWTH
Culture: NO GROWTH

## 2015-01-10 LAB — CBC
HEMATOCRIT: 19.1 % — AB (ref 40.0–52.0)
Hemoglobin: 6.3 g/dL — ABNORMAL LOW (ref 13.0–18.0)
MCH: 28.2 pg (ref 26.0–34.0)
MCHC: 32.9 g/dL (ref 32.0–36.0)
MCV: 85.7 fL (ref 80.0–100.0)
PLATELETS: 118 10*3/uL — AB (ref 150–440)
RBC: 2.23 MIL/uL — AB (ref 4.40–5.90)
RDW: 18 % — ABNORMAL HIGH (ref 11.5–14.5)
WBC: 14 10*3/uL — ABNORMAL HIGH (ref 3.8–10.6)

## 2015-01-10 LAB — GLUCOSE, CAPILLARY
GLUCOSE-CAPILLARY: 119 mg/dL — AB (ref 65–99)
Glucose-Capillary: 95 mg/dL (ref 65–99)
Glucose-Capillary: 74 mg/dL (ref 65–99)
Glucose-Capillary: 96 mg/dL (ref 65–99)

## 2015-01-10 SURGERY — DIALYSIS/PERMA CATHETER INSERTION
Anesthesia: Moderate Sedation

## 2015-01-10 SURGERY — DIALYSIS/PERMA CATHETER INSERTION
Anesthesia: Moderate Sedation | Laterality: Left

## 2015-01-10 MED ORDER — FLUCONAZOLE 100 MG PO TABS
200.0000 mg | ORAL_TABLET | ORAL | Status: DC
  Administered 2015-01-11 – 2015-01-23 (×8): 200 mg via ORAL
  Filled 2015-01-10 (×7): qty 2

## 2015-01-10 MED ORDER — HEPARIN (PORCINE) IN NACL 2-0.9 UNIT/ML-% IJ SOLN
INTRAMUSCULAR | Status: AC
  Filled 2015-01-10: qty 500

## 2015-01-10 MED ORDER — IOHEXOL 300 MG/ML  SOLN
INTRAMUSCULAR | Status: DC | PRN
Start: 2015-01-10 — End: 2015-01-10
  Administered 2015-01-10: 5 mL via INTRAVENOUS

## 2015-01-10 MED ORDER — FLUCONAZOLE 100 MG PO TABS: 400.0000 mg | ORAL_TABLET | Freq: Every day | ORAL | Status: DC

## 2015-01-10 MED ORDER — SODIUM CHLORIDE 0.9 % IV SOLN: 100.0000 mL | INTRAVENOUS | Status: DC | PRN

## 2015-01-10 MED ORDER — MIDAZOLAM HCL 2 MG/2ML IJ SOLN
INTRAMUSCULAR | Status: DC | PRN
  Administered 2015-01-10: 1 mg via INTRAVENOUS
  Administered 2015-01-10: 2 mg via INTRAVENOUS

## 2015-01-10 MED ORDER — MIDAZOLAM HCL 2 MG/2ML IJ SOLN
INTRAMUSCULAR | Status: AC
  Filled 2015-01-10: qty 2

## 2015-01-10 MED ORDER — FENTANYL CITRATE (PF) 100 MCG/2ML IJ SOLN
INTRAMUSCULAR | Status: AC
  Filled 2015-01-10: qty 2

## 2015-01-10 MED ORDER — HEPARIN SODIUM (PORCINE) 10000 UNIT/ML IJ SOLN
INTRAMUSCULAR | Status: AC
  Filled 2015-01-10: qty 1

## 2015-01-10 MED ORDER — FENTANYL CITRATE (PF) 100 MCG/2ML IJ SOLN
INTRAMUSCULAR | Status: DC | PRN
  Administered 2015-01-10 (×2): 50 ug via INTRAVENOUS

## 2015-01-10 MED ORDER — SODIUM CHLORIDE 0.9 % IV SOLN: INTRAVENOUS | Status: DC

## 2015-01-10 MED ORDER — ALTEPLASE 2 MG IJ SOLR
2.0000 mg | Freq: Once | INTRAMUSCULAR | Status: AC | PRN
  Filled 2015-01-10: qty 2

## 2015-01-10 MED ORDER — HEPARIN SODIUM (PORCINE) 1000 UNIT/ML DIALYSIS
1000.0000 [IU] | INTRAMUSCULAR | Status: DC | PRN
  Administered 2015-01-10: 5000 [IU] via INTRAVENOUS_CENTRAL
  Administered 2015-01-13: 1000 [IU] via INTRAVENOUS_CENTRAL
  Administered 2015-01-24: 3400 [IU] via INTRAVENOUS_CENTRAL
  Filled 2015-01-10 (×4): qty 1

## 2015-01-10 MED ORDER — FLUCONAZOLE 100 MG PO TABS
400.0000 mg | ORAL_TABLET | ORAL | Status: DC
  Administered 2015-01-10 – 2015-01-24 (×7): 400 mg via ORAL
  Filled 2015-01-10 (×6): qty 4
  Filled 2015-01-10: qty 2
  Filled 2015-01-10 (×4): qty 4

## 2015-01-10 MED ORDER — LIDOCAINE-EPINEPHRINE (PF) 1 %-1:200000 IJ SOLN
INTRAMUSCULAR | Status: AC
  Filled 2015-01-10: qty 30

## 2015-01-10 SURGICAL SUPPLY — 6 items
CATH CANNON HEMO 15FR 32CM (HEMODIALYSIS SUPPLIES) ×3 IMPLANT
CATH PALINDROME RT-P 15FX23CM (CATHETERS) ×3 IMPLANT
GLIDEWIRE STIFF .35X180X3 HYDR (WIRE) ×3 IMPLANT
PACK ANGIOGRAPHY (CUSTOM PROCEDURE TRAY) ×3 IMPLANT
SHEATH BRITE TIP 6FRX11 (SHEATH) ×3 IMPLANT
TOWEL OR 17X26 4PK STRL BLUE (TOWEL DISPOSABLE) ×3 IMPLANT

## 2015-01-10 NOTE — Consult Note (Signed)
Palliative Care Update  Was paged this am and told to come talk to son.  I came directly to speak with Caleb Francis, and we talked outside the patient's room.  He was upset and confrontational about these matters:  1)  He says that Dr. Manuella Ghazi and I had 'no right' to talk to patient without talking to him or his sister first.  He does not feel that we should ever say things to patient that we don't run by them first. Patient this am is telling his son that we are taking him to get a stent put in today.  Son says if he is delusional now why did we talk to him in the first place.  Patient was grossly oriented yesterday and was able to understand basic communication  I accompanied Dr Manuella Ghazi into the room at his invitation, and we both did tell the patient we were running out of good options to make him better and we wanted to make sure he stayed comfortable.   2) He is upset about being told 'the cultures are positive' by some doctors and being told by ID that 'the cultures are negative."   This is the issue of the cath tip cultures being positive (which is true)  vs the blood cxs being negative (also accurate).  Apparently, soon after our conference, Dr. Ola Spurr came to tell them that the cultures were negative.  They had been told during the conference about the positive cath tip cultures by Dr. Manuella Ghazi and I--and we also mentioned that catheters will provide a place for bacteria to grow.     All his physicians have felt that the patient is reaching a point of futile care.  The meeting yesterday was originally designed to focus on the patient's underlying cancer --which is causing many of his medical problems:    --cancer causes a loss of appetite and his appetite is not likely to improve    --cancer has damaged / suppressed his immune system significanlty making it difficult if not impossible for him to 'clear infections'    --cancer causes clotting and has done so with him.   Unfortunately, Dr.  Mike Gip was unable to attend yesterday due to a major conflict in her schedule. The idea was to focus on the cancer at this meeting. The meeting somehow evolved into a focus on infection and dialysis issues, despite the intended focus, partly because the family feels that the cancer is a completely separate issue (that they seem to think is not a significant problem unless the tumors grow ) --and all these other matters are separate 'medical problems' that can be treated since they have nothing to do with his cancer in their minds.  I had hoped to help them see that his cancer is ACTIVE whether or not the tumors are enlarging off chemo/ treatment for about 6 weeks now.    Yesterday, it was made clearby family  that they cannot take him home 'because there is no one who can be with him there'.I had mentioned him going home on Diflucan and either on or not on dialysis / with or not with Hospice.  But they say he cannot go home.   It also appears that he cannot go to a SNF because he does not have skilled care needs.  Pt has a Medicaid application in, but no active Medicaid and apparently cannot go to a facility under long term care arrangements without Medicaid OR private pay covering room  and board.  Social Work and Case Mgmt were present at the meeting and explained this.  Family declined Hospice Home, he can't go home according to family, and he can't go to a SNF. At least not at this time.  Daughter went to [place] but is coming back Saturday.  Son is here most days.   I have talked with Dr. Abigail Butts, Dr. Ola Spurr, and will talk with Dr. Manuella Ghazi about the communication issues related to the culture results and about son's insistence that his doctors 'have no right to talk to his father'.   As the son was angry, loud, and somewhat threatening, I will withdraw from this patient's care at this time. I have been informed that he also has spoken in similar tone with a (male) case manager recently when she  simply said, " what if this is as good as he gets?".     The son did not tell me to stop seeing his father.  Rather, I told the son that I will withdraw from being involved at this time.    Kirby Funk, MD Palliative Care

## 2015-01-10 NOTE — Progress Notes (Signed)
Report called to nurse cathy, pt sleepy/arousable, rr 28 to 32/mim, sat 94% room air, dressing left chest cdi

## 2015-01-10 NOTE — Op Note (Signed)
OPERATIVE NOTE    PRE-OPERATIVE DIAGNOSIS: 1. ESRD 2. Recent fungemia  POST-OPERATIVE DIAGNOSIS: same as above  PROCEDURE: 1. Ultrasound guidance for vascular access to the right internal jugular vein 2. Fluoroscopic guidance for placement of catheter 3. Catheter placement into the SVC from left jugular vein 4. Left jugular venogram and SVC gram 5. Placement of a 27 cm tip to cuff tunneled hemodialysis catheter via the left internal jugular vein  SURGEON: Leotis Pain, MD  ANESTHESIA:  Local/MCS  ESTIMATED BLOOD LOSS: 25 cc  CONTRAST: 10 cc  FLUORO TIME: 3 minutes  FINDING(S): 1.  Patent left internal jugular vein, tortuous innominate/SVC junction  SPECIMEN(S):  None  INDICATIONS:   Caleb Francis is a 69 y.o. male who presents with a prolonged hospital course and has not recovered his renal function.  The patient needs long term dialysis access for their ESRD, and a Permcath is necessary.  Risks and benefits are discussed and informed consent is obtained.    DESCRIPTION: After obtaining full informed written consent, the patient was brought back to the vascular suited. The patient's left neck and chest were sterilely prepped and draped in a sterile surgical field was created.  The left internal jugular vein was visualized with ultrasound and found to be patent. It was then accessed under direct ultrasound guidance and a permanent image was recorded. A wire was placed, but wound not initially traverse down the SVC and kept going into the azygous system.  With a previous right port a cath, the concern for central venous stenosis as present and I placed a 6 Fr sheath in the jugular vein and performed a venogram.  This showed tortuosity in the innominate/SVC junction, and even after this shot a wire did not pass easily.  I then used a Kumpe catheter and a glidewire taking a picture at the SVC/innominate junction with the Kumpe catheter and then advancing into the SVC and right  atrium, and then parking the wire in the IVC. After skin nick and dilatation, the peel-away sheath was placed over the wire. I then turned my attention to an area under the clavicle. Approximately 1-2 fingerbreadths below the clavicle a small counterincision was created and tunneled from the subclavicular incision to the access site. Using fluoroscopic guidance, a 27cm centimeter tip to cuff tunneled hemodialysis catheter was selected, and tunneled from the subclavicular incision to the access site. It was then placed through the peel-away sheath and the peel-away sheath was removed. Using fluoroscopic guidance the catheter tips were parked in the right atrium. The appropriate distal connectors were placed. It withdrew blood well and flushed easily with heparinized saline and a concentrated heparin solution was then placed. It was secured to the chest wall with 2 Prolene sutures. The access incision was closed single 4-0 Monocryl. A 4-0 Monocryl pursestring suture was placed around the exit site. Sterile dressings were placed. The patient tolerated the procedure well and was taken to the recovery room in stable condition.  COMPLICATIONS: None  CONDITION: Stable  Caleb Francis  01/10/2015, 9:31 AM

## 2015-01-10 NOTE — Progress Notes (Signed)
[facility] [place] NAME: Caleb Francis    MR#:  [id number]  DATE OF BIRTH:  Dec 22, 1945  SUBJECTIVE:  Family quite upset this am, wanting full care and not to talk about discharge per them  REVIEW OF SYSTEMS:   Review of Systems  Constitutional: Positive for malaise/fatigue. Negative for fever, chills and weight loss.  HENT: Negative for ear discharge, ear pain and nosebleeds.   Eyes: Negative for blurred vision, pain and discharge.  Respiratory: Negative for sputum production, shortness of breath, wheezing and stridor.   Cardiovascular: Negative for chest pain, palpitations, orthopnea and PND.  Gastrointestinal: Negative for nausea, vomiting, abdominal pain and diarrhea.  Genitourinary: Negative for urgency and frequency.  Musculoskeletal: Positive for back pain. Negative for joint pain.  Neurological: Positive for weakness. Negative for sensory change, speech change and focal weakness.  Psychiatric/Behavioral: Negative for depression. The patient is not nervous/anxious.    Tolerating Diet:very little Tolerating PT: eval pending   DRUG ALLERGIES:  No Known Allergies VITALS:  Blood pressure 98/61, pulse 103, temperature 98.6 F (37 C), temperature source Oral, resp. rate 30, height 6' (1.829 m), weight 71.532 kg (157 lb 11.2 oz), SpO2 94 %. PHYSICAL EXAMINATION:   Physical Exam GENERAL:  69 y.o.-year-old patient lying in the bed with no acute distress. Appears chronically ill EYES: Pupils equal, round, reactive to light and accommodation. No scleral icterus. Extraocular muscles intact.  HEENT: Head atraumatic, normocephalic. Oropharynx and nasopharynx clear.  NECK:  Supple, no jugular venous distention. No thyroid enlargement, no tenderness.  LUNGS: Normal breath sounds bilaterally, no wheezing, rales, rhonchi. No use of accessory muscles of respiration.  CARDIOVASCULAR: S1, S2 normal. No murmurs, rubs, or gallops.  ABDOMEN:  Soft, nontender, mild distended. Bowel sounds present. No organomegaly or mass.  EXTREMITIES: No cyanosis, clubbing or edema b/l.   Right groin catheter Left arm swelling + NEUROLOGIC: Cranial nerves II through XII are intact. No focal Motor or sensory deficits b/l.  Weak,severe deconditioning PSYCHIATRIC:  patient is alert and oriented x 2.  SKIN: stage II sacral ulcer.  LABORATORY PANEL:   CBC  Recent Labs Lab 01/09/15 0752  WBC 17.1*  HGB 6.9*  HCT 20.9*  PLT 117*    Chemistries   Recent Labs Lab 01/09/15 0752  NA 135  K 3.4*  CL 100*  CO2 22  GLUCOSE 84  BUN 64*  CREATININE 7.04*  CALCIUM 7.8*  MG 1.7   ASSESSMENT AND PLAN:   #1 sepsis: Fungemia with Candida parapsilosis blood cultures 7/15, and again on repeat culture 7/17 + and 7/21 + Appreciate infectious disease consultation. Will change micafungin to PO fluconazole 400 mg PO for total 4 weeks. - Port-A-Cath removed on 7/21 by Dr. Lucky Cowboy. Cath tip growing Klebsiella and Pseudomonas (resulted on 7/27) which is likely not significant per ID so not recommending any abx for same unless clinical condition changes and blood c/s turns +  #2 acute renal failure due to ATN-->now progressed to ESRD s/p cuff tunneled hemodialysis catheter via the left internal jugular vein by Dr Lucky Cowboy on 7/29 to start HD  #3 Mantle cell lymphoma -recvd Neupogen, status post 2 units packed red blood cells on 6/25 and 1 unit on 7/2 - Prognosis is poor.-followed by Dr Mike Gip -at present given overall pt's decline and varioius infections not sure if pt is even a candidate for further Chemotherapy - further mgmt per Onco  #4 Chronic ileus with h/o GI bleeding: Clinically improving.  No further bleeding. NG tube and rectal tube discontinued 7/12 - Continue PPI, high risk for further bleeding due to thrombocytopenia - Last seen by GI on July 4. No further plans for endoscopy.  #5 deconditioning: Unable to participate in physical therapy due to  poor motivation and severe deconditioning. Spoke with PT to see if he improves  #6 Severe protein calorie malnutrition: Continue Marinol. Encourage intake. Discussed with family and the patient they would not be interested in PEG tube placement. pt's calorie count now stopped due to severe poor po intake  #7 encephalopathy: Appreciate neurology consultation. This is likely encephalopathy/delirium due to critical illness. Agree with when necessary Geodon for agitation. Has not been agitated -pt is improving with his mood slowly and seems close to baseline.  #8 pressure ulcers: Wound care consultation appreciated  #9 left basilic vein superficial extensive thrombosis Spoke with dr Delana Meyer and rec give eliquis 2.5 mg bid and repeat US upper extremity in 3-4 days to ensure thrombus not extending further   CODE STATUS: DO NOT RESUSCITATE  Case discussed with Care Management/Social Worker. Management plans discussed with the patient, CSW, Family and Dr Megan Salon - at this point family wanting full care including dialysis.   Will be a very challenging case with long term stay and difficult discharge.  TOTAL TIME TAKING CARE OF THIS PATIENT: 45 minutes.   More than 50% of the time was spent in counseling/coordination of care: YES - had discussion with patient's son this am on phone.  [facility], Tanina Barb M.D on 01/10/2015 at 10:08 AM  Between 7am to 6pm - Pager - [phone]  After 6pm go to [email or web address] - password [email or web address] [facility]  [facility]  Office  [phone]  CC: Primary care physician; No PCP Per Patient

## 2015-01-10 NOTE — Progress Notes (Signed)
Subjective:  Seen and examined on hemodialysis. New [facility] permcath placed today by Dr. Lucky Cowboy.  Family meeting yesterday and today.   Objective:  Vital signs in last 24 hours:  Temp:  [97.5 F (36.4 C)-98.6 F (37 C)] 97.5 F (36.4 C) (07/29 1129) Pulse Rate:  [97-104] 102 (07/29 1129) Resp:  [19-31] 20 (07/29 1129) BP: (89-102)/(49-63) 102/57 mmHg (07/29 1129) SpO2:  [94 %-100 %] 100 % (07/29 1129) Weight:  [71.532 kg (157 lb 11.2 oz)] 71.532 kg (157 lb 11.2 oz) (07/29 0500)  Weight change: -0.59 kg (-1 lb 4.8 oz) Filed Weights   01/08/15 0414 01/09/15 0443 01/10/15 0500  Weight: 72.303 kg (159 lb 6.4 oz) 72.122 kg (159 lb) 71.532 kg (157 lb 11.2 oz)    Intake/Output: I/O last 3 completed shifts: In: -  Out: 625 [Urine:625]   Intake/Output this shift:     Physical Exam: General: chronically ill appearing  Head: oral mucosa moist  Eyes: Anicteric  Neck: Supple, trachea midline  Lungs:  Clear to auscultation normal effort  Heart: S1S2 no rubs  Abdomen:  Soft, NTND, BS present  Extremities: No LE edema  Neurologic: Awake, alert, oriented to self and place, follows commands  Skin: No acute rashes      Access: [facility] permcath  5/63/14    Basic Metabolic Panel:  Recent Labs Lab 01/04/15 0418 01/06/15 1307 01/07/15 0033 01/07/15 0546 01/08/15 0355 01/09/15 0752  NA 134*  --   --   --  134* 135  K 3.2* 2.2* 2.8*  --  3.2* 3.4*  CL 97*  --   --   --  99* 100*  CO2 23  --   --   --  23 22  GLUCOSE 118*  --   --   --  117* 84  BUN 45*  --   --   --  57* 64*  CREATININE 5.61*  --   --   --  6.13* 7.04*  CALCIUM 7.1*  --   --   --  7.3* 7.8*  MG  --   --   --  1.8  --  1.7  PHOS  --   --   --   --   --  2.5    Liver Function Tests:  Recent Labs Lab 01/09/15 0752  ALBUMIN 1.7*   No results for input(s): LIPASE, AMYLASE in the last 168 hours. No results for input(s): AMMONIA in the last 168 hours.  CBC:  Recent Labs Lab 01/04/15 0418 01/08/15 0355  01/08/15 1443 01/09/15 0752  WBC 17.5* 19.3*  --  17.1*  HGB 7.1* 6.1* 7.2* 6.9*  HCT 21.9* 18.8* 22.2* 20.9*  MCV 86.1 86.4  --  86.2  PLT 122* 131*  --  117*    Cardiac Enzymes: No results for input(s): CKTOTAL, CKMB, CKMBINDEX, TROPONINI in the last 168 hours.  BNP: Invalid input(s): POCBNP  CBG:  Recent Labs Lab 01/09/15 0732 01/09/15 1357 01/09/15 2110 01/10/15 0201 01/10/15 1128  GLUCAP 81 87 104* 95 119*    Microbiology: Results for orders placed or performed during the hospital encounter of 11/29/14  MRSA PCR Screening     Status: None   Collection Time: 11/29/14 12:13 PM  Result Value Ref Range Status   MRSA by PCR NEGATIVE NEGATIVE Final    Comment:        The GeneXpert MRSA Assay (FDA approved for NASAL specimens only), is one component of a comprehensive MRSA colonization surveillance program. It  is not intended to diagnose MRSA infection nor to guide or monitor treatment for MRSA infections.   Culture, blood (routine x 2)     Status: None   Collection Time: 11/29/14  1:19 PM  Result Value Ref Range Status   Specimen Description BLOOD  Final   Special Requests Immunocompromised  Final   Culture NO GROWTH 5 DAYS  Final   Report Status 12/04/2014 FINAL  Final  Culture, blood (routine x 2)     Status: None   Collection Time: 11/29/14  3:04 PM  Result Value Ref Range Status   Specimen Description Blood  Final   Special Requests Immunocompromised  Final   Report Status 01/03/2015 FINAL  Final  Urine culture     Status: None   Collection Time: 11/30/14 10:12 AM  Result Value Ref Range Status   Specimen Description URINE, CATHETERIZED  Final   Special Requests Immunocompromised  Final   Culture NO GROWTH 2 DAYS  Final   Report Status 12/02/2014 FINAL  Final  Culture, blood (routine x 2)     Status: None   Collection Time: 12/06/14  8:08 PM  Result Value Ref Range Status   Specimen Description BLOOD  Final   Special Requests NONE  Final    Culture  Setup Time   Final    GRAM POSITIVE COCCI IN BOTH AEROBIC AND ANAEROBIC BOTTLES CRITICAL RESULT CALLED TO, READ BACK BY AND VERIFIED WITH: JENNIFER BEZARD AT 5400 12/08/14.PMH CONFIRMED BY [facility]    Culture   Final    STAPHYLOCOCCUS AURICULARIS IN BOTH AEROBIC AND ANAEROBIC BOTTLES    Report Status 12/11/2014 FINAL  Final   Organism ID, Bacteria STAPHYLOCOCCUS AURICULARIS  Final      Susceptibility   Staphylococcus auricularis - MIC*    CIPROFLOXACIN >=8 RESISTANT Resistant     ERYTHROMYCIN >=8 RESISTANT Resistant     GENTAMICIN <=0.5 SENSITIVE Sensitive     OXACILLIN >=4 RESISTANT Resistant     TETRACYCLINE <=1 SENSITIVE Sensitive     VANCOMYCIN 1 SENSITIVE Sensitive     CLINDAMYCIN <=0.25 SENSITIVE Sensitive     TRIMETH/SULFA Value in next row Sensitive      SENSITIVE<=20    LEVOFLOXACIN Value in next row Intermediate      INTERMEDIATE4    * STAPHYLOCOCCUS AURICULARIS  Culture, blood (routine x 2)     Status: None   Collection Time: 12/06/14  8:40 PM  Result Value Ref Range Status   Specimen Description BLOOD  Final   Special Requests NONE  Final   Culture NO GROWTH 5 DAYS  Final   Report Status 12/11/2014 FINAL  Final  Culture, blood (routine x 2)     Status: None   Collection Time: 12/08/14 11:40 AM  Result Value Ref Range Status   Specimen Description BLOOD  Final   Special Requests Normal  Final   Culture NO GROWTH 6 DAYS  Final   Report Status 12/14/2014 FINAL  Final  Culture, blood (routine x 2)     Status: None   Collection Time: 12/08/14 11:49 AM  Result Value Ref Range Status   Specimen Description BLOOD  Final   Special Requests Normal  Final   Culture NO GROWTH 6 DAYS  Final   Report Status 12/14/2014 FINAL  Final  C difficile quick scan w PCR reflex ([facility] only)     Status: None   Collection Time: 12/14/14 10:11 AM  Result Value Ref Range Status   C Diff antigen NEGATIVE  Final   C Diff toxin NEGATIVE  Final   C Diff interpretation Negative for C.  difficile  Final  Culture, blood (routine x 2)     Status: None   Collection Time: 12/27/14 11:28 AM  Result Value Ref Range Status   Specimen Description BLOOD  Final   Special Requests Immunocompromised  Final   Culture  Setup Time   Final    YEAST AEROBIC BOTTLE ONLY CRITICAL RESULT CALLED TO, READ BACK BY AND VERIFIED WITH: CATHY SUMMERLIN ON 12/29/14 AT 1000AM BY JEF    Culture CANDIDA PARAPSILOSIS AEROBIC BOTTLE ONLY   Final   Report Status 01/02/2015 FINAL  Final  Culture, blood (routine x 2)     Status: None   Collection Time: 12/27/14 11:36 AM  Result Value Ref Range Status   Specimen Description BLOOD  Final   Special Requests Immunocompromised  Final   Culture  Setup Time   Final    YEAST AEROBIC BOTTLE ONLY CRITICAL RESULT CALLED TO, READ BACK BY AND VERIFIED WITH: LISA ROMERO AT 0111 ON 12/29/14 [facility] CONFIRMED BY [place]    Culture CANDIDA PARAPSILOSIS AEROBIC BOTTLE ONLY   Final   Report Status 01/02/2015 FINAL  Final  Cath Tip Culture     Status: None   Collection Time: 12/27/14  4:17 PM  Result Value Ref Range Status   Specimen Description CATH TIP  Final   Special Requests NONE  Final   Culture NO GROWTH 2 DAYS  Final   Report Status 12/30/2014 FINAL  Final  Culture, blood (routine x 2)     Status: None   Collection Time: 12/29/14 12:28 PM  Result Value Ref Range Status   Specimen Description BLOOD RIGHT ARM  Final   Special Requests   Final    BOTTLES DRAWN AEROBIC AND ANAEROBIC  AER 4CC ANA 3CC   Culture  Setup Time   Final    BUDDING YEAST SEEN AEROBIC BOTTLE ONLY CRITICAL RESULT CALLED TO, READ BACK BY AND VERIFIED WITH: BROKE ROBERTSON AT 2230 01/01/15.TSH CONFIRMED BY SDR    Culture CANDIDA PARAPSILOSIS AEROBIC BOTTLE ONLY   Final   Report Status 01/04/2015 FINAL  Final  Culture, blood (routine x 2)     Status: None   Collection Time: 12/29/14 12:28 PM  Result Value Ref Range Status   Specimen Description BLOOD RIGHT FATTY CASTS  Final   Special  Requests BOTTLES DRAWN AEROBIC AND ANAEROBIC  5CC  Final   Culture  Setup Time   Final    YEAST AEROBIC BOTTLE ONLY CRITICAL VALUE NOTED.  VALUE IS CONSISTENT WITH PREVIOUSLY REPORTED AND CALLED VALUE.    Culture CANDIDA PARAPSILOSIS  Final   Report Status 01/04/2015 FINAL  Final  Cath Tip Culture     Status: None   Collection Time: 01/02/15 12:42 PM  Result Value Ref Range Status   Specimen Description CATH TIP  Final   Special Requests Normal  Final   Culture NO GROWTH 3 DAYS  Final   Report Status 01/05/2015 FINAL  Final  Culture, blood (routine x 2)     Status: None   Collection Time: 01/02/15  6:34 PM  Result Value Ref Range Status   Specimen Description BLOOD RIGHT HAND  Final   Special Requests 5 BOTTLES DRAWN AEROBIC AND ANAEROBIC 5ML  Final   Culture  Setup Time   Final    BUDDING YEAST SEEN ANAEROBIC BOTTLE ONLY CRITICAL RESULT CALLED TO, READ BACK BY AND VERIFIED WITH:  IRIS GUIDRY AT 2030 01/04/15 SDR  CONFIRMED BY RW    Culture CANDIDA PARAPSILOSIS  Final   Report Status 01/07/2015 FINAL  Final  Culture, blood (routine x 2)     Status: None   Collection Time: 01/05/15  8:59 AM  Result Value Ref Range Status   Specimen Description BLOOD RIGHT ASSIST CONTROL  Final   Special Requests BOTTLES DRAWN AEROBIC AND ANAEROBIC 1ML  Final   Culture NO GROWTH 5 DAYS  Final   Report Status 01/10/2015 FINAL  Final  Culture, blood (routine x 2)     Status: None   Collection Time: 01/05/15  9:10 AM  Result Value Ref Range Status   Specimen Description BLOOD LEFT HAND  Final   Special Requests BOTTLES DRAWN AEROBIC AND ANAEROBIC 1ML  Final   Culture NO GROWTH 5 DAYS  Final   Report Status 01/10/2015 FINAL  Final  Cath Tip Culture     Status: None   Collection Time: 01/05/15 11:30 AM  Result Value Ref Range Status   Specimen Description CATH TIP  Final   Special Requests NONE  Final   Culture PSEUDOMONAS AERUGINOSA KLEBSIELLA OXYTOCA   Final   Report Status 01/08/2015 FINAL   Final   Organism ID, Bacteria PSEUDOMONAS AERUGINOSA  Final   Organism ID, Bacteria KLEBSIELLA OXYTOCA  Final      Susceptibility   Klebsiella oxytoca - MIC*    AMPICILLIN >=32 RESISTANT Resistant     CEFTAZIDIME <=1 SENSITIVE Sensitive     CEFAZOLIN >=64 RESISTANT Resistant     CEFTRIAXONE 8 SENSITIVE Sensitive     CIPROFLOXACIN <=0.25 SENSITIVE Sensitive     GENTAMICIN <=1 SENSITIVE Sensitive     IMIPENEM <=0.25 SENSITIVE Sensitive     TRIMETH/SULFA <=20 SENSITIVE Sensitive     PIP/TAZO Value in next row Resistant      RESISTANT>=128    * KLEBSIELLA OXYTOCA   Pseudomonas aeruginosa - MIC*    CEFTAZIDIME Value in next row Sensitive      RESISTANT>=128    CIPROFLOXACIN Value in next row Resistant      RESISTANT>=128    GENTAMICIN Value in next row Sensitive      RESISTANT>=128    IMIPENEM Value in next row Resistant      RESISTANT>=128    PIP/TAZO Value in next row Sensitive      SENSITIVE8    * PSEUDOMONAS AERUGINOSA    Coagulation Studies: No results for input(s): LABPROT, INR in the last 72 hours.  Urinalysis: No results for input(s): COLORURINE, LABSPEC, PHURINE, GLUCOSEU, HGBUR, BILIRUBINUR, KETONESUR, PROTEINUR, UROBILINOGEN, NITRITE, LEUKOCYTESUR in the last 72 hours.  Invalid input(s): APPERANCEUR    Imaging: No results found.   Medications:     . amiodarone  200 mg Oral Daily  . antiseptic oral rinse  7 mL Mouth Rinse BID  . apixaban  2.5 mg Oral BID  . carvedilol  20 mg Oral Daily  . dronabinol  2.5 mg Oral BID AC  . epoetin (EPOGEN/PROCRIT) injection  14,000 Units Subcutaneous Q T,Th,Sa-HD  . feeding supplement (NEPRO CARB STEADY)  237 mL Oral BID BM  . fluconazole  400 mg Oral Daily  . mometasone-formoterol  2 puff Inhalation BID  . pantoprazole  40 mg Oral BID  . scopolamine  1 patch Transdermal Q72H   sodium chloride, sodium chloride, acetaminophen **OR** acetaminophen, albuterol, alteplase, haloperidol lactate, heparin, lidocaine (PF),  lidocaine-prilocaine, magnesium hydroxide, metoprolol, ondansetron (ZOFRAN) IV, promethazine, ziprasidone  Assessment/ Plan:  Caleb Francis is a 25 black male with progressive mantle cell lymphoma, RICE chemo in 11/2014, hx left sided hydronephrosis and hydroureter. S/p ureteral stent placement   Hospital course complicated by sepsis with Klebsiella oxytoca, hypernatremia. Now with fungemia.   1. End Stage Renal Failure:  Acute renal failure due to ATN and now without recovery. Pt has undergone multiple dialysis sessions.  Has temporary dialysis catheter removed due to fungemia and now without access. Today new [facility] tunnelled catheter placed.  Urine output is nonoliguric.  - Tolerating treatment well. Will tentatively place on MWF schedule. Evaluate daily for dialysis.   2.  Sepsis with fungemia. A41.9, B49. Status post bacteremia with klebsiella oxytoca: completed two weeks for IV ceftazidime. Subsequently growing candida parapsilosis.  Subcutaneous port removed 01/02/15. - blood cultures from 7/21 now showing yeast despite micafungin.   - 7/24 culture: klebsiella and pseudomonas on cath tip - Appreciate ID input.  Currently on fluconazole.   3.  Anemia of chronic kidney disease/mantle cell lymphoma:   continue epogen 14000 units IV with HD, use OK'd by hematology/onc. Currently on hold since no HD.  - Transfused 1 unit PRBC 7/27.  - CBC today  4. Secondary Hyperparathyroidism: PTH 126. Phos 2.3.  - not on binders.    LOS: Gas, [place]am 7/29/20161:32 PM

## 2015-01-10 NOTE — Care Management (Signed)
Patient had permcath inserted today and received hemodialysis.  Discussed during progression of the need for patient to be able to sit for his dialysis sessions. Discussed the need to start progressing patient in preparation to go to a skilled nursing facility

## 2015-01-10 NOTE — Progress Notes (Addendum)
Nutrition Follow-up  DOCUMENTATION CODES:   Severe malnutrition in context of acute illness/injury  INTERVENTION:   Coordination of Care: noted family wanting full care including dialysis per MD notes. Continue to recommend feeding tube placement if family/pt agreeable; but as previously noted, family and pt have not wanted PEG tube in the past discussions. No new recommendations, Will follow pt weekly but please re-consult RD if change in poc and aggressive nutritional intervention is wanted Medical Food Supplement Therapy: continue supplements Meals and Snacks: Cater to patient preferences, on regular diet, appetite stimulant  NUTRITION DIAGNOSIS:   Inadequate oral intake related to inability to eat as evidenced by NPO status. Continues  GOAL:   Patient will meet greater than or equal to 90% of their needs   MONITOR:    (Energy intake, Anthropometrics, Electrolyte/renal profile, Glucose profile, Digestive System)  ASSESSMENT:   Noted permcath placed today; pt down for dialysis on visit today. Noted recent events with several meetings with family.   Diet Order:  Diet regular Room service appropriate?: Yes; Fluid consistency:: Thin   Energy Intake: po intake remains poor, although nothing documented yesterday. Per Juliann Pulse RN (pt's nurse yesterday and today); pt ate only bites/sips yesterday. Pt appears to eat a little more when family brings food in but nothing significant. No po intake today as pt NPO this AM for procedure, at present pt in dialysis  Skin:   (stage II on buttock, stage I on sacrum)  Height:   Ht Readings from Last 1 Encounters:  11/29/14 6' (1.829 m)    Weight:   Wt Readings from Last 1 Encounters:  01/10/15 157 lb 11.2 oz (71.532 kg)    BMI:  Body mass index is 21.38 kg/(m^2).  Estimated Nutritional Needs:   Kcal:  2831-5176 kcals (BEE 1436, 1.2 AF, 1.2-1.4 IF) using current wt of 62.9 kg  Protein:  76-95 (1.2-1.5 g/kg)   Fluid:  1575-1890 mL  (25-30 ml/kg)   EDUCATION NEEDS:   No education needs identified at this time  [place], RD, LDN [phone] Pager

## 2015-01-10 NOTE — Progress Notes (Signed)
ANTIBIOTIC CONSULT NOTE - INITIAL  Pharmacy Consult for renal dose adjustment of fluconazole Indication: Fungemia candida parapsilosis   No Known Allergies  Patient Measurements: Height: 6' (182.9 cm) Weight: 157 lb 11.2 oz (71.532 kg) IBW/kg (Calculated) : 77.6  Vital Signs: Temp: 97.5 F (36.4 C) (07/29 1325) Temp Source: Oral (07/29 1325) BP: 102/57 mmHg (07/29 1424) Pulse Rate: 108 (07/29 1424) Intake/Output from previous day: 07/28 0701 - 07/29 0700 In: -  Out: 375 [Urine:375] Intake/Output from this shift:    Labs:  Recent Labs  01/08/15 0355 01/08/15 1443 01/09/15 0752 01/10/15 1333  WBC 19.3*  --  17.1* 14.0*  HGB 6.1* 7.2* 6.9* 6.3*  PLT 131*  --  117* 118*  CREATININE 6.13*  --  7.04* 7.11*   Estimated Creatinine Clearance: 10.2 mL/min (by C-G formula based on Cr of 7.11). No results for input(s): VANCOTROUGH, VANCOPEAK, VANCORANDOM, GENTTROUGH, GENTPEAK, GENTRANDOM, TOBRATROUGH, TOBRAPEAK, TOBRARND, AMIKACINPEAK, AMIKACINTROU, AMIKACIN in the last 72 hours.   Microbiology: Recent Results (from the past 720 hour(s))  C difficile quick scan w PCR reflex ([facility] only)     Status: None   Collection Time: 12/14/14 10:11 AM  Result Value Ref Range Status   C Diff antigen NEGATIVE  Final   C Diff toxin NEGATIVE  Final   C Diff interpretation Negative for C. difficile  Final  Culture, blood (routine x 2)     Status: None   Collection Time: 12/27/14 11:28 AM  Result Value Ref Range Status   Specimen Description BLOOD  Final   Special Requests Immunocompromised  Final   Culture  Setup Time   Final    YEAST AEROBIC BOTTLE ONLY CRITICAL RESULT CALLED TO, READ BACK BY AND VERIFIED WITH: CATHY SUMMERLIN ON 12/29/14 AT 1000AM BY JEF    Culture CANDIDA PARAPSILOSIS AEROBIC BOTTLE ONLY   Final   Report Status 01/02/2015 FINAL  Final  Culture, blood (routine x 2)     Status: None   Collection Time: 12/27/14 11:36 AM  Result Value Ref Range Status   Specimen  Description BLOOD  Final   Special Requests Immunocompromised  Final   Culture  Setup Time   Final    YEAST AEROBIC BOTTLE ONLY CRITICAL RESULT CALLED TO, READ BACK BY AND VERIFIED WITH: LISA ROMERO AT 0111 ON 12/29/14 [facility] CONFIRMED BY Aguila    Culture CANDIDA PARAPSILOSIS AEROBIC BOTTLE ONLY   Final   Report Status 01/02/2015 FINAL  Final  Cath Tip Culture     Status: None   Collection Time: 12/27/14  4:17 PM  Result Value Ref Range Status   Specimen Description CATH TIP  Final   Special Requests NONE  Final   Culture NO GROWTH 2 DAYS  Final   Report Status 12/30/2014 FINAL  Final  Culture, blood (routine x 2)     Status: None   Collection Time: 12/29/14 12:28 PM  Result Value Ref Range Status   Specimen Description BLOOD RIGHT ARM  Final   Special Requests   Final    BOTTLES DRAWN AEROBIC AND ANAEROBIC  AER 4CC ANA 3CC   Culture  Setup Time   Final    BUDDING YEAST SEEN AEROBIC BOTTLE ONLY CRITICAL RESULT CALLED TO, READ BACK BY AND VERIFIED WITH: BROKE ROBERTSON AT 2230 01/01/15.TSH CONFIRMED BY SDR    Culture CANDIDA PARAPSILOSIS AEROBIC BOTTLE ONLY   Final   Report Status 01/04/2015 FINAL  Final  Culture, blood (routine x 2)     Status: None  Collection Time: 12/29/14 12:28 PM  Result Value Ref Range Status   Specimen Description BLOOD RIGHT FATTY CASTS  Final   Special Requests BOTTLES DRAWN AEROBIC AND ANAEROBIC  5CC  Final   Culture  Setup Time   Final    YEAST AEROBIC BOTTLE ONLY CRITICAL VALUE NOTED.  VALUE IS CONSISTENT WITH PREVIOUSLY REPORTED AND CALLED VALUE.    Culture CANDIDA PARAPSILOSIS  Final   Report Status 01/04/2015 FINAL  Final  Cath Tip Culture     Status: None   Collection Time: 01/02/15 12:42 PM  Result Value Ref Range Status   Specimen Description CATH TIP  Final   Special Requests Normal  Final   Culture NO GROWTH 3 DAYS  Final   Report Status 01/05/2015 FINAL  Final  Culture, blood (routine x 2)     Status: None   Collection Time:  01/02/15  6:34 PM  Result Value Ref Range Status   Specimen Description BLOOD RIGHT HAND  Final   Special Requests 5 BOTTLES DRAWN AEROBIC AND ANAEROBIC 5ML  Final   Culture  Setup Time   Final    BUDDING YEAST SEEN ANAEROBIC BOTTLE ONLY CRITICAL RESULT CALLED TO, READ BACK BY AND VERIFIED WITH: IRIS GUIDRY AT 2030 01/04/15 SDR  CONFIRMED BY RW    Culture CANDIDA PARAPSILOSIS  Final   Report Status 01/07/2015 FINAL  Final  Culture, blood (routine x 2)     Status: None   Collection Time: 01/05/15  8:59 AM  Result Value Ref Range Status   Specimen Description BLOOD RIGHT ASSIST CONTROL  Final   Special Requests BOTTLES DRAWN AEROBIC AND ANAEROBIC 1ML  Final   Culture NO GROWTH 5 DAYS  Final   Report Status 01/10/2015 FINAL  Final  Culture, blood (routine x 2)     Status: None   Collection Time: 01/05/15  9:10 AM  Result Value Ref Range Status   Specimen Description BLOOD LEFT HAND  Final   Special Requests BOTTLES DRAWN AEROBIC AND ANAEROBIC 1ML  Final   Culture NO GROWTH 5 DAYS  Final   Report Status 01/10/2015 FINAL  Final  Cath Tip Culture     Status: None   Collection Time: 01/05/15 11:30 AM  Result Value Ref Range Status   Specimen Description CATH TIP  Final   Special Requests NONE  Final   Culture PSEUDOMONAS AERUGINOSA KLEBSIELLA OXYTOCA   Final   Report Status 01/08/2015 FINAL  Final   Organism ID, Bacteria PSEUDOMONAS AERUGINOSA  Final   Organism ID, Bacteria KLEBSIELLA OXYTOCA  Final      Susceptibility   Klebsiella oxytoca - MIC*    AMPICILLIN >=32 RESISTANT Resistant     CEFTAZIDIME <=1 SENSITIVE Sensitive     CEFAZOLIN >=64 RESISTANT Resistant     CEFTRIAXONE 8 SENSITIVE Sensitive     CIPROFLOXACIN <=0.25 SENSITIVE Sensitive     GENTAMICIN <=1 SENSITIVE Sensitive     IMIPENEM <=0.25 SENSITIVE Sensitive     TRIMETH/SULFA <=20 SENSITIVE Sensitive     PIP/TAZO Value in next row Resistant      RESISTANT>=128    * KLEBSIELLA OXYTOCA   Pseudomonas aeruginosa -  MIC*    CEFTAZIDIME Value in next row Sensitive      RESISTANT>=128    CIPROFLOXACIN Value in next row Resistant      RESISTANT>=128    GENTAMICIN Value in next row Sensitive      RESISTANT>=128    IMIPENEM Value in next row Resistant  RESISTANT>=128    PIP/TAZO Value in next row Sensitive      SENSITIVE8    * PSEUDOMONAS AERUGINOSA    Medical History: Past Medical History  Diagnosis Date  . Arthritis   . Hypertension   . RA (rheumatoid arthritis)   . Anemia   . Mantle cell lymphoma     Dr Mike Gip  . History of nuclear stress test     a. 12/2013: low risk, no sig ischemia, no EKG changes, no artifact, EF 63%  . Chronic kidney disease   . Sepsis     Dr Ola Spurr    Medications:  Anti-infectives    Start     Dose/Rate Route Frequency Ordered Stop   01/11/15 1800  fluconazole (DIFLUCAN) tablet 200 mg     200 mg Oral Once per day on Sun Tue Thu Sat 01/10/15 1423 02/02/15 2359   01/10/15 1800  fluconazole (DIFLUCAN) tablet 400 mg     400 mg Oral Once per day on Mon Wed Fri 01/10/15 1400 02/02/15 2359   01/10/15 1230  fluconazole (DIFLUCAN) tablet 400 mg  Status:  Discontinued     400 mg Oral Daily 01/10/15 1219 01/10/15 1400   01/02/15 1600  ceFAZolin (ANCEF) IVPB 1 g/50 mL premix     1 g 100 mL/hr over 30 Minutes Intravenous  Once 01/02/15 1550 01/02/15 1621   12/31/14 0000  vancomycin (VANCOCIN) IVPB 750 mg/150 ml premix  Status:  Discontinued     750 mg 150 mL/hr over 60 Minutes Intravenous Once per day on Mon Thu Sat 12/30/14 1126 12/30/14 1131   12/30/14 1200  vancomycin (VANCOCIN) IVPB 750 mg/150 ml premix  Status:  Discontinued     750 mg 150 mL/hr over 60 Minutes Intravenous Once per day on Mon Thu Sat 12/29/14 0903 12/30/14 1126   12/30/14 1129  vancomycin (VANCOCIN) IVPB 750 mg/150 ml premix  Status:  Discontinued    Comments:  Administer dose during last hour of dialysis as long as pre-dialysis random level is less then 20 mcg/mL   750 mg 150 mL/hr over  60 Minutes Intravenous Every Dialysis 12/30/14 1131 12/30/14 1527   12/29/14 1130  micafungin (MYCAMINE) 100 mg in sodium chloride 0.9 % 100 mL IVPB  Status:  Discontinued     100 mg 100 mL/hr over 1 Hours Intravenous Daily 12/29/14 1117 01/10/15 1219   12/28/14 1200  vancomycin (VANCOCIN) IVPB 750 mg/150 ml premix  Status:  Discontinued     750 mg 150 mL/hr over 60 Minutes Intravenous Once per day on Tue Thu Sat 12/27/14 1210 12/29/14 0903   12/27/14 1800  fluconazole (DIFLUCAN) IVPB 200 mg  Status:  Discontinued     200 mg 100 mL/hr over 60 Minutes Intravenous Every 24 hours 12/27/14 1402 12/27/14 1404   12/27/14 1800  fluconazole (DIFLUCAN) IVPB 100 mg  Status:  Discontinued     100 mg 50 mL/hr over 60 Minutes Intravenous Every 24 hours 12/27/14 1404 12/29/14 1117   12/27/14 1400  piperacillin-tazobactam (ZOSYN) IVPB 3.375 g  Status:  Discontinued     3.375 g 12.5 mL/hr over 240 Minutes Intravenous 3 times per day 12/27/14 1049 12/27/14 1210   12/27/14 1300  vancomycin (VANCOCIN) 1,500 mg in sodium chloride 0.9 % 500 mL IVPB     1,500 mg 250 mL/hr over 120 Minutes Intravenous  Once 12/27/14 1210 12/27/14 1521   12/27/14 1230  piperacillin-tazobactam (ZOSYN) IVPB 3.375 g  Status:  Discontinued     3.375 g  12.5 mL/hr over 240 Minutes Intravenous Every 12 hours 12/27/14 1210 12/30/14 1527   12/27/14 1100  vancomycin (VANCOCIN) powder 1,000 mg  Status:  Discontinued     1,000 mg Other To Surgery 12/27/14 1049 12/27/14 1119   12/27/14 1100  vancomycin (VANCOCIN) IVPB 1000 mg/200 mL premix  Status:  Discontinued     1,000 mg 200 mL/hr over 60 Minutes Intravenous Every 24 hours 12/27/14 1049 12/27/14 1210   12/27/14 0000  ceFAZolin (ANCEF) IVPB 1 g/50 mL premix    Comments:  Send with pt to OR   1 g 100 mL/hr over 30 Minutes Intravenous On call 12/26/14 2208 12/28/14 0000   12/19/14 1800  vancomycin (VANCOCIN) IVPB 750 mg/150 ml premix     750 mg 150 mL/hr over 60 Minutes Intravenous   Once 12/19/14 1515 12/19/14 1940   12/15/14 0630  vancomycin (VANCOCIN) IVPB 750 mg/150 ml premix     750 mg 150 mL/hr over 60 Minutes Intravenous  Once 12/15/14 0616 12/15/14 0726   12/14/14 1300  vancomycin (VANCOCIN) IVPB 750 mg/150 ml premix  Status:  Discontinued     750 mg 150 mL/hr over 60 Minutes Intravenous Every 48 hours 12/13/14 1107 12/14/14 0604   12/14/14 0603  vancomycin (VANCOCIN) IVPB 750 mg/150 ml premix  Status:  Discontinued     750 mg 150 mL/hr over 60 Minutes Intravenous Daily PRN 12/14/14 0604 12/21/14 1110   12/09/14 0900  erythromycin 250 mg in sodium chloride 0.9 % 100 mL IVPB  Status:  Discontinued     250 mg 100 mL/hr over 60 Minutes Intravenous 3 times per day 12/09/14 0859 12/13/14 1054   12/08/14 1100  metroNIDAZOLE (FLAGYL) IVPB 500 mg  Status:  Discontinued     500 mg 100 mL/hr over 60 Minutes Intravenous Every 8 hours 12/08/14 0958 12/11/14 1048   12/08/14 0900  vancomycin (VANCOCIN) IVPB 750 mg/150 ml premix  Status:  Discontinued     750 mg 150 mL/hr over 60 Minutes Intravenous Every 24 hours 12/07/14 1102 12/12/14 0955   12/07/14 1030  cefTAZidime (FORTAZ) 2 g in dextrose 5 % 50 mL IVPB  Status:  Discontinued     2 g 100 mL/hr over 30 Minutes Intravenous Every 24 hours 12/07/14 1021 12/11/14 1048   12/07/14 0700  vancomycin (VANCOCIN) IVPB 1000 mg/200 mL premix  Status:  Discontinued     1,000 mg 200 mL/hr over 60 Minutes Intravenous Every 24 hours 12/06/14 2225 12/07/14 1102   12/06/14 2200  piperacillin-tazobactam (ZOSYN) IVPB 4.5 g  Status:  Discontinued     4.5 g 200 mL/hr over 30 Minutes Intravenous 3 times per day 12/06/14 1946 12/07/14 1011   12/06/14 2000  vancomycin (VANCOCIN) IVPB 1000 mg/200 mL premix     1,000 mg 200 mL/hr over 60 Minutes Intravenous STAT 12/06/14 1947 12/06/14 2113   12/06/14 2000  cefTRIAXone (ROCEPHIN) 1 g in dextrose 5 % 50 mL IVPB - Premix  Status:  Discontinued     1 g 100 mL/hr over 30 Minutes Intravenous Every  24 hours 12/06/14 1959 12/07/14 1011   12/06/14 1945  piperacillin-tazobactam (ZOSYN) IVPB 3.375 g  Status:  Discontinued     3.375 g 100 mL/hr over 30 Minutes Intravenous 4 times per day 12/06/14 1940 12/06/14 2007   12/06/14 1945  vancomycin (VANCOCIN) IVPB 1000 mg/200 mL premix  Status:  Discontinued     1,000 mg 200 mL/hr over 60 Minutes Intravenous Every 12 hours 12/06/14 1940 12/06/14 2008  12/01/14 0945  piperacillin-tazobactam (ZOSYN) IVPB 3.375 g  Status:  Discontinued     3.375 g 12.5 mL/hr over 240 Minutes Intravenous 3 times per day 12/01/14 0936 12/03/14 1337   11/30/14 1000  cefTRIAXone (ROCEPHIN) 2 g in dextrose 5 % 50 mL IVPB - Premix  Status:  Discontinued    Comments:  Entered per MD progress note   2 g 100 mL/hr over 30 Minutes Intravenous Every 24 hours 11/29/14 2157 12/06/14 1940   11/29/14 1415  cefTRIAXone (ROCEPHIN) 1 g in dextrose 5 % 50 mL IVPB - Premix     1 g 100 mL/hr over 30 Minutes Intravenous  Once 11/29/14 1406 11/29/14 1452   11/29/14 1414  cefTRIAXone (ROCEPHIN) 20 MG/ML IVPB 50 mL    Comments:  MONAR, NELLIE: cabinet override      11/29/14 1414 11/29/14 1424     Assessment: Pharmacy consulted to adjust fluconazole dose for renal function in this 69 year old male with ESRD on HD 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  Per ID will plan to treat for 4 week since first negative blood culture on 7/24  Plan:  Current orders for fluconazole 400mg  PO daily. Will adjust for renal function to 400mg  PO QPM on HD days and 200mg  PO QPM on all other days. Doses to be given after dialysis. Scheduled to stop on 8/21, 4 weeks from negative blood culture.  Patient tentatively scheduled for HD on MWF, however nephrology plans to evaluate daily for dialysis need. Will order doses according to current HD schedule (MWF). Pharmacy will need to follow closely and adjust dose schedule if HD schedule changes or extra sessions needed.  Rexene Edison,  PharmD Clinical Pharmacist  01/10/2015,2:25 PM

## 2015-01-10 NOTE — Progress Notes (Signed)
PT Cancellation Note  Patient Details Name: Caleb Francis MRN: [id number] DOB: [birth year]   Cancelled Treatment:    Reason Eval/Treat Not Completed:  (See PT note for further details). PT attempted to see pt this AM, however per nursing staff, pt lethargic secondary to recently having perm cath placed by MDs. Will attempt this PM pending time/schedule.    Janyth Contes 01/10/2015, 11:59 AM  Janyth Contes, SPT. [phone]

## 2015-01-11 LAB — CBC
HCT: 19.5 % — ABNORMAL LOW (ref 40.0–52.0)
Hemoglobin: 6.4 g/dL — ABNORMAL LOW (ref 13.0–18.0)
MCH: 28.3 pg (ref 26.0–34.0)
MCHC: 32.5 g/dL (ref 32.0–36.0)
MCV: 87.1 fL (ref 80.0–100.0)
Platelets: 115 10*3/uL — ABNORMAL LOW (ref 150–440)
RBC: 2.24 MIL/uL — AB (ref 4.40–5.90)
RDW: 17.9 % — AB (ref 11.5–14.5)
WBC: 15.5 10*3/uL — AB (ref 3.8–10.6)

## 2015-01-11 LAB — BASIC METABOLIC PANEL
ANION GAP: 12 (ref 5–15)
BUN: 32 mg/dL — ABNORMAL HIGH (ref 6–20)
CALCIUM: 7.5 mg/dL — AB (ref 8.9–10.3)
CO2: 27 mmol/L (ref 22–32)
CREATININE: 4 mg/dL — AB (ref 0.61–1.24)
Chloride: 99 mmol/L — ABNORMAL LOW (ref 101–111)
GFR calc non Af Amer: 14 mL/min — ABNORMAL LOW (ref >60)
GFR, EST AFRICAN AMERICAN: 16 mL/min — AB (ref >60)
Glucose, Bld: 106 mg/dL — ABNORMAL HIGH (ref 65–99)
Potassium: 2.8 mmol/L — CL (ref 3.5–5.1)
Sodium: 138 mmol/L (ref 135–145)

## 2015-01-11 LAB — PTH, INTACT AND CALCIUM
Calcium, Total (PTH): 7.7 mg/dL — ABNORMAL LOW (ref 8.6–10.2)
PTH: 71 pg/mL — ABNORMAL HIGH (ref 15–65)

## 2015-01-11 LAB — GLUCOSE, CAPILLARY
Glucose-Capillary: 148 mg/dL — ABNORMAL HIGH (ref 65–99)
Glucose-Capillary: 130 mg/dL — ABNORMAL HIGH (ref 65–99)
Glucose-Capillary: 110 mg/dL — ABNORMAL HIGH (ref 65–99)

## 2015-01-11 LAB — PREPARE RBC (CROSSMATCH)

## 2015-01-11 LAB — HEMOGLOBIN AND HEMATOCRIT, BLOOD
HEMATOCRIT: 24 % — AB (ref 40.0–52.0)
Hemoglobin: 7.9 g/dL — ABNORMAL LOW (ref 13.0–18.0)

## 2015-01-11 LAB — HEPATITIS B SURFACE ANTIGEN: Hepatitis B Surface Ag: NEGATIVE

## 2015-01-11 LAB — HEPATITIS B SURFACE ANTIBODY,QUALITATIVE: Hep B S Ab: NONREACTIVE

## 2015-01-11 MED ORDER — SODIUM CHLORIDE 0.9 % IV SOLN
Freq: Once | INTRAVENOUS | Status: AC
  Administered 2015-01-11: 08:00:00 via INTRAVENOUS

## 2015-01-11 NOTE — Progress Notes (Signed)
Subjective:  Hemodialysis yesterday. Tolerated procedure well.  Patient laying in bed, not eating his breakfast tray.   Objective:  Vital signs in last 24 hours:  Temp:  [97.5 F (36.4 C)-99.3 F (37.4 C)] 97.8 F (36.6 C) (07/30 0827) Pulse Rate:  [96-117] 102 (07/30 0827) Resp:  [18-31] 19 (07/30 0827) BP: (75-116)/(53-64) 105/58 mmHg (07/30 0827) SpO2:  [94 %-100 %] 100 % (07/30 0827) Weight:  [71.124 kg (156 lb 12.8 oz)-71.4 kg (157 lb 6.5 oz)] 71.124 kg (156 lb 12.8 oz) (07/30 0500)  Weight change: -0.132 kg (-4.7 oz) Filed Weights   01/10/15 0500 01/10/15 1300 01/11/15 0500  Weight: 71.532 kg (157 lb 11.2 oz) 71.4 kg (157 lb 6.5 oz) 71.124 kg (156 lb 12.8 oz)    Intake/Output: I/O last 3 completed shifts: In: -  Out: 444 [Urine:325; Other:119]   Intake/Output this shift:     Physical Exam: General: chronically ill appearing  Head: oral mucosa moist  Eyes: Anicteric  Neck: Supple, trachea midline  Lungs:  Clear to auscultation normal effort  Heart: S1S2 no rubs  Abdomen:  Soft, NTND, BS present  Extremities: No LE edema  Neurologic: Awake, alert, oriented to self and place, follows commands  Skin: No acute rashes      Access: [facility] permcath  01/10/15 Dr. Lucky Cowboy    Basic Metabolic Panel:  Recent Labs Lab 01/07/15 0033 01/07/15 4098  01/08/15 0355 01/09/15 0752 01/10/15 1333 01/11/15 0552  NA  --   --   --  134* 135 133* 138  K 2.8*  --   --  3.2* 3.4* 3.2* 2.8*  CL  --   --   --  99* 100* 98* 99*  CO2  --   --   --  23 22 22 27   GLUCOSE  --   --   --  117* 84 101* 106*  BUN  --   --   --  57* 64* 68* 32*  CREATININE  --   --   --  6.13* 7.04* 7.11* 4.00*  CALCIUM  --   --   < > 7.3* 7.8* 7.6*  7.7* 7.5*  MG  --  1.8  --   --  1.7  --   --   PHOS  --   --   --   --  2.5 3.2  --   < > = values in this interval not displayed.  Liver Function Tests:  Recent Labs Lab 01/09/15 0752 01/10/15 1333  ALBUMIN 1.7* 1.6*   No results for input(s):  LIPASE, AMYLASE in the last 168 hours. No results for input(s): AMMONIA in the last 168 hours.  CBC:  Recent Labs Lab 01/08/15 0355 01/08/15 1443 01/09/15 0752 01/10/15 1333 01/11/15 0552  WBC 19.3*  --  17.1* 14.0* 15.5*  HGB 6.1* 7.2* 6.9* 6.3* 6.4*  HCT 18.8* 22.2* 20.9* 19.1* 19.5*  MCV 86.4  --  86.2 85.7 87.1  PLT 131*  --  117* 118* 115*    Cardiac Enzymes: No results for input(s): CKTOTAL, CKMB, CKMBINDEX, TROPONINI in the last 168 hours.  BNP: Invalid input(s): POCBNP  CBG:  Recent Labs Lab 01/10/15 0201 01/10/15 1128 01/10/15 1713 01/10/15 2038 01/11/15 0158  GLUCAP 95 119* 74 96 148*    Microbiology: Results for orders placed or performed during the hospital encounter of 11/29/14  MRSA PCR Screening     Status: None   Collection Time: 11/29/14 12:13 PM  Result Value Ref Range Status  MRSA by PCR NEGATIVE NEGATIVE Final    Comment:        The GeneXpert MRSA Assay (FDA approved for NASAL specimens only), is one component of a comprehensive MRSA colonization surveillance program. It is not intended to diagnose MRSA infection nor to guide or monitor treatment for MRSA infections.   Culture, blood (routine x 2)     Status: None   Collection Time: 11/29/14  1:19 PM  Result Value Ref Range Status   Specimen Description BLOOD  Final   Special Requests Immunocompromised  Final   Culture NO GROWTH 5 DAYS  Final   Report Status 12/04/2014 FINAL  Final  Culture, blood (routine x 2)     Status: None   Collection Time: 11/29/14  3:04 PM  Result Value Ref Range Status   Specimen Description Blood  Final   Special Requests Immunocompromised  Final   Report Status 01/03/2015 FINAL  Final  Urine culture     Status: None   Collection Time: 11/30/14 10:12 AM  Result Value Ref Range Status   Specimen Description URINE, CATHETERIZED  Final   Special Requests Immunocompromised  Final   Culture NO GROWTH 2 DAYS  Final   Report Status 12/02/2014 FINAL   Final  Culture, blood (routine x 2)     Status: None   Collection Time: 12/06/14  8:08 PM  Result Value Ref Range Status   Specimen Description BLOOD  Final   Special Requests NONE  Final   Culture  Setup Time   Final    GRAM POSITIVE COCCI IN BOTH AEROBIC AND ANAEROBIC BOTTLES CRITICAL RESULT CALLED TO, READ BACK BY AND VERIFIED WITH: JENNIFER BEZARD AT 7371 12/08/14.PMH CONFIRMED BY RWW    Culture   Final    STAPHYLOCOCCUS AURICULARIS IN BOTH AEROBIC AND ANAEROBIC BOTTLES    Report Status 12/11/2014 FINAL  Final   Organism ID, Bacteria STAPHYLOCOCCUS AURICULARIS  Final      Susceptibility   Staphylococcus auricularis - MIC*    CIPROFLOXACIN >=8 RESISTANT Resistant     ERYTHROMYCIN >=8 RESISTANT Resistant     GENTAMICIN <=0.5 SENSITIVE Sensitive     OXACILLIN >=4 RESISTANT Resistant     TETRACYCLINE <=1 SENSITIVE Sensitive     VANCOMYCIN 1 SENSITIVE Sensitive     CLINDAMYCIN <=0.25 SENSITIVE Sensitive     TRIMETH/SULFA Value in next row Sensitive      SENSITIVE<=20    LEVOFLOXACIN Value in next row Intermediate      INTERMEDIATE4    * STAPHYLOCOCCUS AURICULARIS  Culture, blood (routine x 2)     Status: None   Collection Time: 12/06/14  8:40 PM  Result Value Ref Range Status   Specimen Description BLOOD  Final   Special Requests NONE  Final   Culture NO GROWTH 5 DAYS  Final   Report Status 12/11/2014 FINAL  Final  Culture, blood (routine x 2)     Status: None   Collection Time: 12/08/14 11:40 AM  Result Value Ref Range Status   Specimen Description BLOOD  Final   Special Requests Normal  Final   Culture NO GROWTH 6 DAYS  Final   Report Status 12/14/2014 FINAL  Final  Culture, blood (routine x 2)     Status: None   Collection Time: 12/08/14 11:49 AM  Result Value Ref Range Status   Specimen Description BLOOD  Final   Special Requests Normal  Final   Culture NO GROWTH 6 DAYS  Final   Report Status 12/14/2014  FINAL  Final  C difficile quick scan w PCR reflex ([facility]  only)     Status: None   Collection Time: 12/14/14 10:11 AM  Result Value Ref Range Status   C Diff antigen NEGATIVE  Final   C Diff toxin NEGATIVE  Final   C Diff interpretation Negative for C. difficile  Final  Culture, blood (routine x 2)     Status: None   Collection Time: 12/27/14 11:28 AM  Result Value Ref Range Status   Specimen Description BLOOD  Final   Special Requests Immunocompromised  Final   Culture  Setup Time   Final    YEAST AEROBIC BOTTLE ONLY CRITICAL RESULT CALLED TO, READ BACK BY AND VERIFIED WITH: CATHY SUMMERLIN ON 12/29/14 AT 1000AM BY JEF    Culture CANDIDA PARAPSILOSIS AEROBIC BOTTLE ONLY   Final   Report Status 01/02/2015 FINAL  Final  Culture, blood (routine x 2)     Status: None   Collection Time: 12/27/14 11:36 AM  Result Value Ref Range Status   Specimen Description BLOOD  Final   Special Requests Immunocompromised  Final   Culture  Setup Time   Final    YEAST AEROBIC BOTTLE ONLY CRITICAL RESULT CALLED TO, READ BACK BY AND VERIFIED WITH: LISA ROMERO AT 0111 ON 12/29/14 RWW CONFIRMED BY Harris    Culture CANDIDA PARAPSILOSIS AEROBIC BOTTLE ONLY   Final   Report Status 01/02/2015 FINAL  Final  Cath Tip Culture     Status: None   Collection Time: 12/27/14  4:17 PM  Result Value Ref Range Status   Specimen Description CATH TIP  Final   Special Requests NONE  Final   Culture NO GROWTH 2 DAYS  Final   Report Status 12/30/2014 FINAL  Final  Culture, blood (routine x 2)     Status: None   Collection Time: 12/29/14 12:28 PM  Result Value Ref Range Status   Specimen Description BLOOD RIGHT ARM  Final   Special Requests   Final    BOTTLES DRAWN AEROBIC AND ANAEROBIC  AER 4CC ANA 3CC   Culture  Setup Time   Final    BUDDING YEAST SEEN AEROBIC BOTTLE ONLY CRITICAL RESULT CALLED TO, READ BACK BY AND VERIFIED WITH: BROKE ROBERTSON AT 2230 01/01/15.TSH CONFIRMED BY SDR    Culture CANDIDA PARAPSILOSIS AEROBIC BOTTLE ONLY   Final   Report Status  01/04/2015 FINAL  Final  Culture, blood (routine x 2)     Status: None   Collection Time: 12/29/14 12:28 PM  Result Value Ref Range Status   Specimen Description BLOOD RIGHT FATTY CASTS  Final   Special Requests BOTTLES DRAWN AEROBIC AND ANAEROBIC  5CC  Final   Culture  Setup Time   Final    YEAST AEROBIC BOTTLE ONLY CRITICAL VALUE NOTED.  VALUE IS CONSISTENT WITH PREVIOUSLY REPORTED AND CALLED VALUE.    Culture CANDIDA PARAPSILOSIS  Final   Report Status 01/04/2015 FINAL  Final  Cath Tip Culture     Status: None   Collection Time: 01/02/15 12:42 PM  Result Value Ref Range Status   Specimen Description CATH TIP  Final   Special Requests Normal  Final   Culture NO GROWTH 3 DAYS  Final   Report Status 01/05/2015 FINAL  Final  Culture, blood (routine x 2)     Status: None   Collection Time: 01/02/15  6:34 PM  Result Value Ref Range Status   Specimen Description BLOOD RIGHT HAND  Final  Special Requests 5 BOTTLES DRAWN AEROBIC AND ANAEROBIC 5ML  Final   Culture  Setup Time   Final    BUDDING YEAST SEEN ANAEROBIC BOTTLE ONLY CRITICAL RESULT CALLED TO, READ BACK BY AND VERIFIED WITH: IRIS GUIDRY AT 2030 01/04/15 SDR  CONFIRMED BY RW    Culture CANDIDA PARAPSILOSIS  Final   Report Status 01/07/2015 FINAL  Final  Culture, blood (routine x 2)     Status: None   Collection Time: 01/05/15  8:59 AM  Result Value Ref Range Status   Specimen Description BLOOD RIGHT ASSIST CONTROL  Final   Special Requests BOTTLES DRAWN AEROBIC AND ANAEROBIC 1ML  Final   Culture NO GROWTH 5 DAYS  Final   Report Status 01/10/2015 FINAL  Final  Culture, blood (routine x 2)     Status: None   Collection Time: 01/05/15  9:10 AM  Result Value Ref Range Status   Specimen Description BLOOD LEFT HAND  Final   Special Requests BOTTLES DRAWN AEROBIC AND ANAEROBIC 1ML  Final   Culture NO GROWTH 5 DAYS  Final   Report Status 01/10/2015 FINAL  Final  Cath Tip Culture     Status: None   Collection Time: 01/05/15  11:30 AM  Result Value Ref Range Status   Specimen Description CATH TIP  Final   Special Requests NONE  Final   Culture PSEUDOMONAS AERUGINOSA KLEBSIELLA OXYTOCA   Final   Report Status 01/08/2015 FINAL  Final   Organism ID, Bacteria PSEUDOMONAS AERUGINOSA  Final   Organism ID, Bacteria KLEBSIELLA OXYTOCA  Final      Susceptibility   Klebsiella oxytoca - MIC*    AMPICILLIN >=32 RESISTANT Resistant     CEFTAZIDIME <=1 SENSITIVE Sensitive     CEFAZOLIN >=64 RESISTANT Resistant     CEFTRIAXONE 8 SENSITIVE Sensitive     CIPROFLOXACIN <=0.25 SENSITIVE Sensitive     GENTAMICIN <=1 SENSITIVE Sensitive     IMIPENEM <=0.25 SENSITIVE Sensitive     TRIMETH/SULFA <=20 SENSITIVE Sensitive     PIP/TAZO Value in next row Resistant      RESISTANT>=128    * KLEBSIELLA OXYTOCA   Pseudomonas aeruginosa - MIC*    CEFTAZIDIME Value in next row Sensitive      RESISTANT>=128    CIPROFLOXACIN Value in next row Resistant      RESISTANT>=128    GENTAMICIN Value in next row Sensitive      RESISTANT>=128    IMIPENEM Value in next row Resistant      RESISTANT>=128    PIP/TAZO Value in next row Sensitive      SENSITIVE8    * PSEUDOMONAS AERUGINOSA    Coagulation Studies: No results for input(s): LABPROT, INR in the last 72 hours.  Urinalysis: No results for input(s): COLORURINE, LABSPEC, PHURINE, GLUCOSEU, HGBUR, BILIRUBINUR, KETONESUR, PROTEINUR, UROBILINOGEN, NITRITE, LEUKOCYTESUR in the last 72 hours.  Invalid input(s): APPERANCEUR    Imaging: No results found.   Medications:     . sodium chloride   Intravenous Once  . amiodarone  200 mg Oral Daily  . antiseptic oral rinse  7 mL Mouth Rinse BID  . apixaban  2.5 mg Oral BID  . carvedilol  20 mg Oral Daily  . dronabinol  2.5 mg Oral BID AC  . epoetin (EPOGEN/PROCRIT) injection  14,000 Units Subcutaneous Q T,Th,Sa-HD  . feeding supplement (NEPRO CARB STEADY)  237 mL Oral BID BM  . fluconazole  200 mg Oral Once per day on Sun Tue Thu  Sat  .  fluconazole  400 mg Oral Once per day on Mon Wed Fri  . mometasone-formoterol  2 puff Inhalation BID  . pantoprazole  40 mg Oral BID  . scopolamine  1 patch Transdermal Q72H   sodium chloride, sodium chloride, acetaminophen **OR** acetaminophen, albuterol, haloperidol lactate, heparin, lidocaine (PF), lidocaine-prilocaine, magnesium hydroxide, metoprolol, ondansetron (ZOFRAN) IV, promethazine, ziprasidone  Assessment/ Plan:  Caleb Francis is a 64 black male with progressive mantle cell lymphoma, RICE chemo in 11/2014, hx left sided hydronephrosis and hydroureter. S/p ureteral stent placement   Hospital course complicated by sepsis with Klebsiella oxytoca, hypernatremia. Now with fungemia.   1. End Stage Renal Failure:  Acute renal failure due to ATN and now without recovery.  - Hemodialysis yesterday. Will monitor for dialysis need daily. Next treatment for Monday tentatively.  - Urine output is nonoliguric.   2.  Sepsis with fungemia. A41.9, B49. Status post bacteremia with klebsiella oxytoca: completed two weeks for IV ceftazidime. Subsequently growing candida parapsilosis.  Subcutaneous port removed 01/02/15. - blood cultures from 7/21 now showing yeast despite micafungin.   - 7/24 culture: klebsiella and pseudomonas on cath tip - Appreciate ID input.  Currently on fluconazole.   3.  Anemia of chronic kidney disease/mantle cell lymphoma:  - order transfusion 1 unit PRBC  continue epogen 14000 units IV with HD, use OK'd by hematology/onc. Currently on hold since no HD.  - Transfused 1 unit PRBC 7/27.   4. Secondary Hyperparathyroidism: PTH 126. Phos 2.3.  - not on binders.   5. Hypokalemia: will monitor, no indication for potassium replacement when getting 1 unit PRBC today.    LOS: [place], [place] 7/30/20169:39 AM

## 2015-01-11 NOTE — Progress Notes (Signed)
Patient resting in the bed at this time, VSS mood calm , critical result K=2.8 Dr Marcille Blanco was notified , no new order received .

## 2015-01-11 NOTE — Progress Notes (Signed)
[facility] [place] NAME: Caleb Francis    MR#:  [id number]  DATE OF BIRTH:  1945-09-23  SUBJECTIVE:  Weakness.  REVIEW OF SYSTEMS:   Review of Systems  Constitutional: Positive for malaise/fatigue. Negative for fever, chills and weight loss.  HENT: Negative for ear discharge, ear pain and nosebleeds.   Eyes: Negative for blurred vision, pain and discharge.  Respiratory: Negative for sputum production, shortness of breath, wheezing and stridor.   Cardiovascular: Negative for chest pain, palpitations, orthopnea and PND.  Gastrointestinal: Negative for nausea, vomiting, abdominal pain and diarrhea.  Genitourinary: Negative for urgency and frequency.  Musculoskeletal: Negative for back pain and joint pain.  Neurological: Positive for weakness. Negative for sensory change, speech change and focal weakness.  Psychiatric/Behavioral: Negative for depression. The patient is not nervous/anxious.    Tolerating Diet:very little Tolerating PT: eval pending   DRUG ALLERGIES:  No Known Allergies VITALS:  Blood pressure 114/64, pulse 109, temperature 98.2 F (36.8 C), temperature source Oral, resp. rate 18, height 6' (1.829 m), weight 71.124 kg (156 lb 12.8 oz), SpO2 100 %. PHYSICAL EXAMINATION:   Physical Exam GENERAL:  69 y.o.-year-old patient lying in the bed with no acute distress. Appears chronically ill EYES: Pupils equal, round, reactive to light and accommodation. No scleral icterus. Extraocular muscles intact.  HEENT: Head atraumatic, normocephalic. Oropharynx and nasopharynx clear.  NECK:  Supple, no jugular venous distention. No thyroid enlargement, no tenderness.  LUNGS: Normal breath sounds bilaterally, no wheezing, rales, rhonchi. No use of accessory muscles of respiration.  CARDIOVASCULAR: S1, S2 normal. No murmurs, rubs, or gallops.  ABDOMEN: Soft, nontender, mild distended. Bowel sounds present. No organomegaly or mass.   EXTREMITIES: No cyanosis, clubbing or edema b/l.  NEUROLOGIC: Cranial nerves II through XII are intact. No focal Motor or sensory deficits b/l.  Weak,severe deconditioning PSYCHIATRIC:  patient is alert and oriented x 2.  SKIN: stage II sacral ulcer.  LABORATORY PANEL:   CBC  Recent Labs Lab 01/11/15 0552  WBC 15.5*  HGB 6.4*  HCT 19.5*  PLT 115*    Chemistries   Recent Labs Lab 01/09/15 0752  01/11/15 0552  NA 135  < > 138  K 3.4*  < > 2.8*  CL 100*  < > 99*  CO2 22  < > 27  GLUCOSE 84  < > 106*  BUN 64*  < > 32*  CREATININE 7.04*  < > 4.00*  CALCIUM 7.8*  < > 7.5*  MG 1.7  --   --   < > = values in this interval not displayed. ASSESSMENT AND PLAN:   #1 sepsis: Fungemia with Candida parapsilosis blood cultures 7/15, and again on repeat culture 7/17 + and 7/21 + Appreciate infectious disease consultation.  continue micafungin to PO fluconazole 400 mg PO for total 4 weeks.  - Port-A-Cath removed on 7/21 by Dr. Lucky Cowboy. Cath tip growing Klebsiella and Pseudomonas (resulted on 7/27) which is likely not significant per ID so not recommending any abx for same unless clinical condition changes and blood c/s turns +  #2 acute renal failure due to ATN-->now progressed to ESRD s/p permcath on 7/29, continue HD on M/W/F per Dr. Juleen China.   * Anemia of chronic disease. Hb 6.4 today, no active bleeding, get 1 unit of PRBC, f/u Hb in am.  continue epogen 14000 units IV with HD.  #3 Mantle cell lymphoma -recvd Neupogen, status post 2 units packed red blood cells on  6/25 and 1 unit on 7/2 - Prognosis is poor.-followed by Dr Mike Gip -at present given overall pt's decline and varioius infections not sure if pt is even a candidate for further Chemotherapy - further mgmt per Onco   #4 Chronic ileus with h/o GI bleeding: Clinically improving. No further bleeding. NG tube and rectal tube discontinued 7/12 - Continue PPI, high risk for further bleeding due to thrombocytopenia - Last  seen by GI on July 4. No further plans for endoscopy.  #5 deconditioning: Unable to participate in physical therapy due to poor motivation and severe deconditioning. PT to see if he improves  #6 Severe protein calorie malnutrition: Continue Marinol.  Dr. Manuella Ghazi discussed with family and the patient they would not be interested in PEG tube placement. pt's calorie count now stopped due to severe poor po intake. Encourage intake.  #7 encephalopathy: Appreciate neurology consultation. This is likely encephalopathy/delirium due to critical illness. Agree with when necessary Geodon for agitation. Has not been agitated -pt is improving with his mood slowly and seems close to baseline.  #8 pressure ulcers: Wound care consultation appreciated  #9 left basilic vein superficial extensive thrombosis On eliquis 2.5 mg bid and repeat US upper extremity in 3-4 days to ensure thrombus not extending further  * Hypokalemia. no indication for potassium replacement when getting 1 unit PRBC today per Dr. Juleen China. F/u BMP.    CODE STATUS: DO NOT RESUSCITATE  Case discussed with Care Management/Social Worker. Management plans discussed with the patient, CSW, Dr. Juleen China.  Will be a very challenging case with long term stay and difficult discharge.  TOTAL TIME TAKING CARE OF THIS PATIENT: 46 minutes.   More than 50% of the time was spent in counseling/coordination of care.   [name] M.D on 01/11/2015 at 2:08 PM  Between 7am to 6pm - Pager - [phone]  After 6pm go to [email or web address] - password [email or web address] [facility]  [facility]  Office  [phone]  CC: Primary care physician; No PCP Per Patient

## 2015-01-11 NOTE — Progress Notes (Signed)
PT Cancellation Note  Patient Details Name: Caleb Francis MRN: [id number] DOB: [birth year]   Cancelled Treatment:    Reason Eval/Treat Not Completed: Medical issues which prohibited therapy (Hemoglobin 6.4 - contraindicated )  Caryl Pina C. Calyn Sivils, PT, DPT #[id number]  Tayvia Faughnan 01/11/2015, 3:24 PM

## 2015-01-12 LAB — TYPE AND SCREEN
ABO/RH(D): O POS
Antibody Screen: NEGATIVE
UNIT DIVISION: 0
Unit division: 0

## 2015-01-12 LAB — BASIC METABOLIC PANEL
Anion gap: 13 (ref 5–15)
BUN: 35 mg/dL — ABNORMAL HIGH (ref 6–20)
CHLORIDE: 101 mmol/L (ref 101–111)
CO2: 26 mmol/L (ref 22–32)
CREATININE: 4.91 mg/dL — AB (ref 0.61–1.24)
Calcium: 7.9 mg/dL — ABNORMAL LOW (ref 8.9–10.3)
GFR calc Af Amer: 13 mL/min — ABNORMAL LOW (ref >60)
GFR calc non Af Amer: 11 mL/min — ABNORMAL LOW (ref >60)
GLUCOSE: 105 mg/dL — AB (ref 65–99)
POTASSIUM: 2.5 mmol/L — AB (ref 3.5–5.1)
Sodium: 140 mmol/L (ref 135–145)

## 2015-01-12 LAB — CBC
HCT: 23.3 % — ABNORMAL LOW (ref 40.0–52.0)
Hemoglobin: 7.7 g/dL — ABNORMAL LOW (ref 13.0–18.0)
MCH: 28.8 pg (ref 26.0–34.0)
MCHC: 32.9 g/dL (ref 32.0–36.0)
MCV: 87.5 fL (ref 80.0–100.0)
PLATELETS: 108 10*3/uL — AB (ref 150–440)
RBC: 2.66 MIL/uL — AB (ref 4.40–5.90)
RDW: 17.1 % — ABNORMAL HIGH (ref 11.5–14.5)
WBC: 17.4 10*3/uL — AB (ref 3.8–10.6)

## 2015-01-12 LAB — MAGNESIUM: Magnesium: 1.6 mg/dL — ABNORMAL LOW (ref 1.7–2.4)

## 2015-01-12 MED ORDER — MAGNESIUM SULFATE 2 GM/50ML IV SOLN
2.0000 g | Freq: Once | INTRAVENOUS | Status: AC
  Administered 2015-01-12: 2 g via INTRAVENOUS
  Filled 2015-01-12 (×2): qty 50

## 2015-01-12 MED ORDER — POTASSIUM CHLORIDE CRYS ER 20 MEQ PO TBCR
40.0000 meq | EXTENDED_RELEASE_TABLET | Freq: Two times a day (BID) | ORAL | Status: AC
  Administered 2015-01-12 (×2): 40 meq via ORAL
  Filled 2015-01-12 (×2): qty 2

## 2015-01-12 MED ORDER — POTASSIUM CHLORIDE 10 MEQ/100ML IV SOLN
10.0000 meq | INTRAVENOUS | Status: AC
  Administered 2015-01-12 (×4): 10 meq via INTRAVENOUS
  Filled 2015-01-12 (×5): qty 100

## 2015-01-12 NOTE — Progress Notes (Addendum)
[facility] NAME: Caleb Francis    MR#:  [id number]  DATE OF BIRTH:  10-08-1945  SUBJECTIVE:  Weakness and poor oral intake. REVIEW OF SYSTEMS:   Review of Systems  Constitutional: Positive for malaise/fatigue. Negative for fever, chills and weight loss.  HENT: Negative for ear discharge, ear pain and nosebleeds.   Eyes: Negative for blurred vision, pain and discharge.  Respiratory: Negative for sputum production, shortness of breath, wheezing and stridor.   Cardiovascular: Negative for chest pain, palpitations, orthopnea and PND.  Gastrointestinal: Negative for nausea, vomiting, abdominal pain and diarrhea.  Genitourinary: Negative for urgency and frequency.  Musculoskeletal: Negative for back pain and joint pain.  Neurological: Positive for weakness. Negative for sensory change, speech change and focal weakness.  Psychiatric/Behavioral: Negative for depression. The patient is not nervous/anxious.    Tolerating Diet:very little Tolerating PT: eval pending   DRUG ALLERGIES:  No Known Allergies VITALS:  Blood pressure 105/62, pulse 99, temperature 98.2 F (36.8 C), temperature source Oral, resp. rate 18, height 6' (1.829 m), weight 69.7 kg (153 lb 10.6 oz), SpO2 100 %. PHYSICAL EXAMINATION:   Physical Exam GENERAL:  69 y.o.-year-old patient lying in the bed with no acute distress. Appears chronically ill EYES: Pupils equal, round, reactive to light and accommodation. No scleral icterus. Extraocular muscles intact.  HEENT: Head atraumatic, normocephalic. Oropharynx and nasopharynx clear.  NECK:  Supple, no jugular venous distention. No thyroid enlargement, no tenderness.  LUNGS: Normal breath sounds bilaterally, no wheezing, rales, rhonchi. No use of accessory muscles of respiration.  CARDIOVASCULAR: S1, S2 normal. No murmurs, rubs, or gallops.  ABDOMEN: Soft, nontender, mild distended. Bowel sounds present. No  organomegaly or mass.  EXTREMITIES: No cyanosis, clubbing or edema b/l.  NEUROLOGIC: Cranial nerves II through XII are intact. No focal Motor or sensory deficits b/l.  Weak,severe deconditioning PSYCHIATRIC:  patient is alert and oriented x 3.  SKIN: stage II sacral ulcer.  LABORATORY PANEL:   CBC  Recent Labs Lab 01/12/15 0437  WBC 17.4*  HGB 7.7*  HCT 23.3*  PLT 108*    Chemistries   Recent Labs Lab 01/12/15 0437  NA 140  K 2.5*  CL 101  CO2 26  GLUCOSE 105*  BUN 35*  CREATININE 4.91*  CALCIUM 7.9*  MG 1.6*   ASSESSMENT AND PLAN:   #1 sepsis: Fungemia with Candida parapsilosis blood cultures 7/15, and again on repeat culture 7/17 + and 7/21 + Appreciate infectious disease consultation.  continue micafungin to PO fluconazole 400 mg PO for total 4 weeks. * Leukocytosis. Worsening. Follow-up CBC.  - Port-A-Cath removed on 7/21 by Dr. Lucky Cowboy. Cath tip growing Klebsiella and Pseudomonas (resulted on 7/27) which is likely not significant per ID so not recommending any abx for same unless clinical condition changes and blood c/s turns +  #2 acute renal failure due to ATN-->now progressed to ESRD s/p permcath on 7/29, continue HD on M/W/F per Dr. Juleen China.   * Anemia of chronic disease. Hb up to 7.7 today, no active bleeding, s/p total 4 unit of PRBC, f/u Hb in am.  continue epogen 14000 units IV with HD.  #3 Mantle cell lymphoma -recvd Neupogen, status post 2 units packed red blood cells on 6/25 and 1 unit on 7/2 - Prognosis is poor.-followed by Dr Mike Gip -at present given overall pt's decline and varioius infections not sure if pt is even a candidate for further Chemotherapy - further mgmt per [facility]   #  4 Chronic ileus with h/o GI bleeding: Clinically improving. No further bleeding. NG tube and rectal tube discontinued 7/12 - Continue PPI, high risk for further bleeding due to thrombocytopenia - Last seen by GI on July 4. No further plans for endoscopy.  #5  deconditioning: Unable to participate in physical therapy due to poor motivation and severe deconditioning. PT to see if he improves  #6 Severe protein calorie malnutrition: Continue Marinol.  Dr. Manuella Ghazi discussed with family and the patient they would not be interested in PEG tube placement. pt's calorie count now stopped due to severe poor po intake. Encourage intake.  #7 encephalopathy: Appreciate neurology consultation. This is likely encephalopathy/delirium due to critical illness. Agree with when necessary Geodon for agitation. Has not been agitated  improved to baseline.  #8 pressure ulcers: Wound care.  #9 left basilic vein superficial extensive thrombosis On eliquis 2.5 mg bid and repeat US upper extremity in 2 days to ensure thrombus not extending further  * Hypokalemia and hypomagnesemia. Give potassium and magnesium replacement per Dr. Juleen China. F/u BMP.    CODE STATUS: DO NOT RESUSCITATE  Case discussed with Care Management/Social Worker. Management plans discussed with the patient, CSW, Dr. Juleen China.  Will be a very challenging case with long term stay and difficult discharge. PT evaluation suggested skilled nursing facility placement.  TOTAL TIME TAKING CARE OF THIS PATIENT: 38 minutes.   More than 50% of the time was spent in counseling/coordination of care.   [name] M.D on 01/12/2015 at 2:06 PM  Between 7am to 6pm - Pager - [phone]  After 6pm go to [email or web address] - password [email or web address] [facility]  [facility]  Office  [phone]  CC: Primary care physician; No PCP Per Patient

## 2015-01-12 NOTE — Progress Notes (Signed)
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    PT Comments    Pt very limited with PT today, 3 separate attempts at sitting at EOB, on the last attempt with much assist he is able to get to sitting and remains there for ~1 minute with heavy assist, but ultimately is very weak, limited, unable to attempt standing and quickly lays back down.    Follow Up Recommendations  SNF     Equipment Recommendations       Recommendations for Other Services       Precautions / Restrictions Precautions Precautions: Fall Restrictions Weight Bearing Restrictions: No    Mobility  Bed Mobility Overal bed mobility: Needs Assistance Bed Mobility: Supine to Sit     Supine to sit: Max assist     General bed mobility comments: Pt struggles to get to fully sitting upright and is unable to hold himself up at all needing heavy assist.  Pt requests not to try to stand and quickly tries to lay back down after 3 seperate attempts at sitting  Transfers                    Ambulation/Gait                 Stairs            Wheelchair Mobility    Modified Rankin (Stroke Patients Only)       Balance Overall balance assessment: Needs assistance     Sitting balance - Comments: Pt is unable to maintain sitting w/o at least mod assist and heavy encouragement.  Pt struggles to get to fully upright sitting even with heavy assist                            Cognition Arousal/Alertness: Lethargic Behavior During Therapy: Restless Overall Cognitive Status: Within Functional Limits for tasks assessed                      Exercises General Exercises - Lower Extremity Ankle Circles/Pumps: AROM;10 reps;Both Heel Slides: AROM;Strengthening;10 reps;Both Hip ABduction/ADduction:  AROM;Strengthening;10 reps;Both Straight Leg Raises: AROM;Both;10 reps    General Comments        Pertinent Vitals/Pain Pain Assessment:  (generalized pain, not rated)    Home Living                      Prior Function            PT Goals (current goals can now be found in the care plan section) Progress towards PT goals:  (Pt continues to be very weak and limited)    Frequency  Min 2X/week    PT Plan Current plan remains appropriate    Co-evaluation             End of Session   Activity Tolerance: Patient limited by fatigue Patient left: with bed alarm set     Time: 1032-1100 PT Time Calculation (min) (ACUTE ONLY): 28 min  Charges:  $Therapeutic Exercise: 8-22 mins $Therapeutic Activity: 8-22 mins                    G Codes:     Wayne Both, PT, DPT [phone]  Kreg Shropshire 01/12/2015, 12:45  PM   

## 2015-01-12 NOTE — Progress Notes (Signed)
Subjective:   Working with PT. Oriented times three this morning.  Replacing potassium and magnesium this morning.  Has not touched his breakfast tray.   Objective:  Vital signs in last 24 hours:  Temp:  [97.8 F (36.6 C)-98.5 F (36.9 C)] 98.2 F (36.8 C) (07/31 1145) Pulse Rate:  [99-109] 99 (07/31 1145) Resp:  [17-19] 18 (07/31 1145) BP: (105-140)/(61-70) 105/62 mmHg (07/31 1145) SpO2:  [100 %] 100 % (07/31 1145) Weight:  [69.7 kg (153 lb 10.6 oz)] 69.7 kg (153 lb 10.6 oz) (07/31 0347)  Weight change: -1.7 kg (-3 lb 12 oz) Filed Weights   01/10/15 1300 01/11/15 0500 01/12/15 0347  Weight: 71.4 kg (157 lb 6.5 oz) 71.124 kg (156 lb 12.8 oz) 69.7 kg (153 lb 10.6 oz)    Intake/Output: I/O last 3 completed shifts: In: 1520 [P.O.:1200; Blood:320] Out: 851 [Urine:850; Stool:1]   Intake/Output this shift:     Physical Exam: General: chronically ill appearing  Head: oral mucosa moist  Eyes: Anicteric  Neck: Supple, trachea midline  Lungs:  Clear to auscultation normal effort  Heart: S1S2 no rubs  Abdomen:  Soft, NTND, BS present  Extremities: No LE edema  Neurologic: Awake, alert, oriented today  Skin: No acute rashes      Access: LIJ permcath  01/10/15 Dr. Lucky Cowboy    Basic Metabolic Panel:  Recent Labs Lab 01/07/15 1007  01/08/15 0355 01/09/15 1219 01/10/15 1333 01/11/15 0552 01/12/15 0437  NA  --   --  134* 135 133* 138 140  K  --   --  3.2* 3.4* 3.2* 2.8* 2.5*  CL  --   --  99* 100* 98* 99* 101  CO2  --   --  23 22 22 27 26   GLUCOSE  --   --  117* 84 101* 106* 105*  BUN  --   --  57* 64* 68* 32* 35*  CREATININE  --   --  6.13* 7.04* 7.11* 4.00* 4.91*  CALCIUM  --   < > 7.3* 7.8* 7.6*  7.7* 7.5* 7.9*  MG 1.8  --   --  1.7  --   --  1.6*  PHOS  --   --   --  2.5 3.2  --   --   < > = values in this interval not displayed.  Liver Function Tests:  Recent Labs Lab 01/09/15 0752 01/10/15 1333  ALBUMIN 1.7* 1.6*   No results for input(s): LIPASE,  AMYLASE in the last 168 hours. No results for input(s): AMMONIA in the last 168 hours.  CBC:  Recent Labs Lab 01/08/15 0355  01/09/15 0752 01/10/15 1333 01/11/15 0552 01/11/15 2107 01/12/15 0437  WBC 19.3*  --  17.1* 14.0* 15.5*  --  17.4*  HGB 6.1*  < > 6.9* 6.3* 6.4* 7.9* 7.7*  HCT 18.8*  < > 20.9* 19.1* 19.5* 24.0* 23.3*  MCV 86.4  --  86.2 85.7 87.1  --  87.5  PLT 131*  --  117* 118* 115*  --  108*  < > = values in this interval not displayed.  Cardiac Enzymes: No results for input(s): CKTOTAL, CKMB, CKMBINDEX, TROPONINI in the last 168 hours.  BNP: Invalid input(s): POCBNP  CBG:  Recent Labs Lab 01/10/15 1713 01/10/15 2038 01/11/15 0158 01/11/15 1155 01/11/15 1622  GLUCAP 74 96 148* 130* 110*    Microbiology: Results for orders placed or performed during the hospital encounter of 11/29/14  MRSA PCR Screening     Status:  None   Collection Time: 11/29/14 12:13 PM  Result Value Ref Range Status   MRSA by PCR NEGATIVE NEGATIVE Final    Comment:        The GeneXpert MRSA Assay (FDA approved for NASAL specimens only), is one component of a comprehensive MRSA colonization surveillance program. It is not intended to diagnose MRSA infection nor to guide or monitor treatment for MRSA infections.   Culture, blood (routine x 2)     Status: None   Collection Time: 11/29/14  1:19 PM  Result Value Ref Range Status   Specimen Description BLOOD  Final   Special Requests Immunocompromised  Final   Culture NO GROWTH 5 DAYS  Final   Report Status 12/04/2014 FINAL  Final  Culture, blood (routine x 2)     Status: None   Collection Time: 11/29/14  3:04 PM  Result Value Ref Range Status   Specimen Description Blood  Final   Special Requests Immunocompromised  Final   Report Status 01/03/2015 FINAL  Final  Urine culture     Status: None   Collection Time: 11/30/14 10:12 AM  Result Value Ref Range Status   Specimen Description URINE, CATHETERIZED  Final   Special  Requests Immunocompromised  Final   Culture NO GROWTH 2 DAYS  Final   Report Status 12/02/2014 FINAL  Final  Culture, blood (routine x 2)     Status: None   Collection Time: 12/06/14  8:08 PM  Result Value Ref Range Status   Specimen Description BLOOD  Final   Special Requests NONE  Final   Culture  Setup Time   Final    GRAM POSITIVE COCCI IN BOTH AEROBIC AND ANAEROBIC BOTTLES CRITICAL RESULT CALLED TO, READ BACK BY AND VERIFIED WITH: JENNIFER BEZARD AT 2355 12/08/14.PMH CONFIRMED BY RWW    Culture   Final    STAPHYLOCOCCUS AURICULARIS IN BOTH AEROBIC AND ANAEROBIC BOTTLES    Report Status 12/11/2014 FINAL  Final   Organism ID, Bacteria STAPHYLOCOCCUS AURICULARIS  Final      Susceptibility   Staphylococcus auricularis - MIC*    CIPROFLOXACIN >=8 RESISTANT Resistant     ERYTHROMYCIN >=8 RESISTANT Resistant     GENTAMICIN <=0.5 SENSITIVE Sensitive     OXACILLIN >=4 RESISTANT Resistant     TETRACYCLINE <=1 SENSITIVE Sensitive     VANCOMYCIN 1 SENSITIVE Sensitive     CLINDAMYCIN <=0.25 SENSITIVE Sensitive     TRIMETH/SULFA Value in next row Sensitive      SENSITIVE<=20    LEVOFLOXACIN Value in next row Intermediate      INTERMEDIATE4    * STAPHYLOCOCCUS AURICULARIS  Culture, blood (routine x 2)     Status: None   Collection Time: 12/06/14  8:40 PM  Result Value Ref Range Status   Specimen Description BLOOD  Final   Special Requests NONE  Final   Culture NO GROWTH 5 DAYS  Final   Report Status 12/11/2014 FINAL  Final  Culture, blood (routine x 2)     Status: None   Collection Time: 12/08/14 11:40 AM  Result Value Ref Range Status   Specimen Description BLOOD  Final   Special Requests Normal  Final   Culture NO GROWTH 6 DAYS  Final   Report Status 12/14/2014 FINAL  Final  Culture, blood (routine x 2)     Status: None   Collection Time: 12/08/14 11:49 AM  Result Value Ref Range Status   Specimen Description BLOOD  Final   Special Requests Normal  Final   Culture NO  GROWTH 6 DAYS  Final   Report Status 12/14/2014 FINAL  Final  C difficile quick scan w PCR reflex ([facility] only)     Status: None   Collection Time: 12/14/14 10:11 AM  Result Value Ref Range Status   C Diff antigen NEGATIVE  Final   C Diff toxin NEGATIVE  Final   C Diff interpretation Negative for C. difficile  Final  Culture, blood (routine x 2)     Status: None   Collection Time: 12/27/14 11:28 AM  Result Value Ref Range Status   Specimen Description BLOOD  Final   Special Requests Immunocompromised  Final   Culture  Setup Time   Final    YEAST AEROBIC BOTTLE ONLY CRITICAL RESULT CALLED TO, READ BACK BY AND VERIFIED WITH: CATHY SUMMERLIN ON 12/29/14 AT 1000AM BY JEF    Culture CANDIDA PARAPSILOSIS AEROBIC BOTTLE ONLY   Final   Report Status 01/02/2015 FINAL  Final  Culture, blood (routine x 2)     Status: None   Collection Time: 12/27/14 11:36 AM  Result Value Ref Range Status   Specimen Description BLOOD  Final   Special Requests Immunocompromised  Final   Culture  Setup Time   Final    YEAST AEROBIC BOTTLE ONLY CRITICAL RESULT CALLED TO, READ BACK BY AND VERIFIED WITH: LISA ROMERO AT 0111 ON 12/29/14 RWW CONFIRMED BY [place]    Culture CANDIDA PARAPSILOSIS AEROBIC BOTTLE ONLY   Final   Report Status 01/02/2015 FINAL  Final  Cath Tip Culture     Status: None   Collection Time: 12/27/14  4:17 PM  Result Value Ref Range Status   Specimen Description CATH TIP  Final   Special Requests NONE  Final   Culture NO GROWTH 2 DAYS  Final   Report Status 12/30/2014 FINAL  Final  Culture, blood (routine x 2)     Status: None   Collection Time: 12/29/14 12:28 PM  Result Value Ref Range Status   Specimen Description BLOOD RIGHT ARM  Final   Special Requests   Final    BOTTLES DRAWN AEROBIC AND ANAEROBIC  AER 4CC ANA 3CC   Culture  Setup Time   Final    BUDDING YEAST SEEN AEROBIC BOTTLE ONLY CRITICAL RESULT CALLED TO, READ BACK BY AND VERIFIED WITH: BROKE ROBERTSON AT 2230  01/01/15.TSH CONFIRMED BY SDR    Culture CANDIDA PARAPSILOSIS AEROBIC BOTTLE ONLY   Final   Report Status 01/04/2015 FINAL  Final  Culture, blood (routine x 2)     Status: None   Collection Time: 12/29/14 12:28 PM  Result Value Ref Range Status   Specimen Description BLOOD RIGHT FATTY CASTS  Final   Special Requests BOTTLES DRAWN AEROBIC AND ANAEROBIC  5CC  Final   Culture  Setup Time   Final    YEAST AEROBIC BOTTLE ONLY CRITICAL VALUE NOTED.  VALUE IS CONSISTENT WITH PREVIOUSLY REPORTED AND CALLED VALUE.    Culture CANDIDA PARAPSILOSIS  Final   Report Status 01/04/2015 FINAL  Final  Cath Tip Culture     Status: None   Collection Time: 01/02/15 12:42 PM  Result Value Ref Range Status   Specimen Description CATH TIP  Final   Special Requests Normal  Final   Culture NO GROWTH 3 DAYS  Final   Report Status 01/05/2015 FINAL  Final  Culture, blood (routine x 2)     Status: None   Collection Time: 01/02/15  6:34 PM  Result  Value Ref Range Status   Specimen Description BLOOD RIGHT HAND  Final   Special Requests 5 BOTTLES DRAWN AEROBIC AND ANAEROBIC 5ML  Final   Culture  Setup Time   Final    BUDDING YEAST SEEN ANAEROBIC BOTTLE ONLY CRITICAL RESULT CALLED TO, READ BACK BY AND VERIFIED WITH: IRIS GUIDRY AT 2030 01/04/15 SDR  CONFIRMED BY RW    Culture CANDIDA PARAPSILOSIS  Final   Report Status 01/07/2015 FINAL  Final  Culture, blood (routine x 2)     Status: None   Collection Time: 01/05/15  8:59 AM  Result Value Ref Range Status   Specimen Description BLOOD RIGHT ASSIST CONTROL  Final   Special Requests BOTTLES DRAWN AEROBIC AND ANAEROBIC 1ML  Final   Culture NO GROWTH 5 DAYS  Final   Report Status 01/10/2015 FINAL  Final  Culture, blood (routine x 2)     Status: None   Collection Time: 01/05/15  9:10 AM  Result Value Ref Range Status   Specimen Description BLOOD LEFT HAND  Final   Special Requests BOTTLES DRAWN AEROBIC AND ANAEROBIC 1ML  Final   Culture NO GROWTH 5 DAYS   Final   Report Status 01/10/2015 FINAL  Final  Cath Tip Culture     Status: None   Collection Time: 01/05/15 11:30 AM  Result Value Ref Range Status   Specimen Description CATH TIP  Final   Special Requests NONE  Final   Culture PSEUDOMONAS AERUGINOSA KLEBSIELLA OXYTOCA   Final   Report Status 01/08/2015 FINAL  Final   Organism ID, Bacteria PSEUDOMONAS AERUGINOSA  Final   Organism ID, Bacteria KLEBSIELLA OXYTOCA  Final      Susceptibility   Klebsiella oxytoca - MIC*    AMPICILLIN >=32 RESISTANT Resistant     CEFTAZIDIME <=1 SENSITIVE Sensitive     CEFAZOLIN >=64 RESISTANT Resistant     CEFTRIAXONE 8 SENSITIVE Sensitive     CIPROFLOXACIN <=0.25 SENSITIVE Sensitive     GENTAMICIN <=1 SENSITIVE Sensitive     IMIPENEM <=0.25 SENSITIVE Sensitive     TRIMETH/SULFA <=20 SENSITIVE Sensitive     PIP/TAZO Value in next row Resistant      RESISTANT>=128    * KLEBSIELLA OXYTOCA   Pseudomonas aeruginosa - MIC*    CEFTAZIDIME Value in next row Sensitive      RESISTANT>=128    CIPROFLOXACIN Value in next row Resistant      RESISTANT>=128    GENTAMICIN Value in next row Sensitive      RESISTANT>=128    IMIPENEM Value in next row Resistant      RESISTANT>=128    PIP/TAZO Value in next row Sensitive      SENSITIVE8    * PSEUDOMONAS AERUGINOSA    Coagulation Studies: No results for input(s): LABPROT, INR in the last 72 hours.  Urinalysis: No results for input(s): COLORURINE, LABSPEC, PHURINE, GLUCOSEU, HGBUR, BILIRUBINUR, KETONESUR, PROTEINUR, UROBILINOGEN, NITRITE, LEUKOCYTESUR in the last 72 hours.  Invalid input(s): APPERANCEUR    Imaging: No results found.   Medications:     . amiodarone  200 mg Oral Daily  . antiseptic oral rinse  7 mL Mouth Rinse BID  . apixaban  2.5 mg Oral BID  . carvedilol  20 mg Oral Daily  . dronabinol  2.5 mg Oral BID AC  . epoetin (EPOGEN/PROCRIT) injection  14,000 Units Subcutaneous Q T,Th,Sa-HD  . feeding supplement (NEPRO CARB STEADY)  237  mL Oral BID BM  . fluconazole  200 mg Oral Once  per day on Sun Tue Thu Sat  . fluconazole  400 mg Oral Once per day on Mon Wed Fri  . mometasone-formoterol  2 puff Inhalation BID  . pantoprazole  40 mg Oral BID  . potassium chloride  10 mEq Intravenous Q1 Hr x 4  . potassium chloride  40 mEq Oral BID  . scopolamine  1 patch Transdermal Q72H   sodium chloride, sodium chloride, acetaminophen **OR** acetaminophen, albuterol, haloperidol lactate, heparin, lidocaine (PF), lidocaine-prilocaine, magnesium hydroxide, metoprolol, ondansetron (ZOFRAN) IV, promethazine, ziprasidone  Assessment/ Plan:  Mr. Solanki is a 66 black male with progressive mantle cell lymphoma, RICE chemo in 11/2014, hx left sided hydronephrosis and hydroureter. S/p ureteral stent placement   Hospital course complicated by sepsis with Klebsiella oxytoca, hypernatremia. Now with fungemia.   1. End Stage Renal Failure:  Acute renal failure due to ATN and now without recovery.  - Hemodialysis on MWF. Will monitor for dialysis need daily. Next treatment for Monday tentatively. Orders prepared.  - Urine output is nonoliguric.   2.  Sepsis with fungemia. A41.9, B49. Status post bacteremia with klebsiella oxytoca: completed two weeks for IV ceftazidime. Subsequently growing candida parapsilosis.  Subcutaneous port removed 01/02/15. - blood cultures from 7/21 now showing yeast despite micafungin.   - 7/24 culture: klebsiella and pseudomonas on cath tip - Appreciate ID input.  Currently on fluconazole.   3.  Anemia of chronic kidney disease/mantle cell lymphoma:  - status post transfusion 1 unit PRBC on 7/30 and 7/27 - continue epogen 14000 units IV with HD, use OK'd by hematology/onc.   4. Secondary Hyperparathyroidism: PTH 126. Phos 2.3.  - not on binders. No indication for activated vitamin D   5. Hypokalemia and hypomagnesemia: not with diarrhea nor diuretics.  Concerned for poor PO intake. - IV electrolytes replacement  today.    LOS: [place], [place] 7/31/201611:50 AM

## 2015-01-13 ENCOUNTER — Inpatient Hospital Stay: Payer: Medicare Other

## 2015-01-13 ENCOUNTER — Encounter: Payer: Self-pay | Admitting: Vascular Surgery

## 2015-01-13 LAB — BASIC METABOLIC PANEL
Anion gap: 12 (ref 5–15)
BUN: 41 mg/dL — AB (ref 6–20)
CALCIUM: 7.8 mg/dL — AB (ref 8.9–10.3)
CO2: 25 mmol/L (ref 22–32)
Chloride: 101 mmol/L (ref 101–111)
Creatinine, Ser: 5.02 mg/dL — ABNORMAL HIGH (ref 0.61–1.24)
GFR calc non Af Amer: 11 mL/min — ABNORMAL LOW (ref >60)
GFR, EST AFRICAN AMERICAN: 12 mL/min — AB (ref >60)
Glucose, Bld: 91 mg/dL (ref 65–99)
Potassium: 3.3 mmol/L — ABNORMAL LOW (ref 3.5–5.1)
Sodium: 138 mmol/L (ref 135–145)

## 2015-01-13 LAB — CBC
HEMATOCRIT: 22.1 % — AB (ref 40.0–52.0)
Hemoglobin: 7.5 g/dL — ABNORMAL LOW (ref 13.0–18.0)
MCH: 29.8 pg (ref 26.0–34.0)
MCHC: 33.8 g/dL (ref 32.0–36.0)
MCV: 88.2 fL (ref 80.0–100.0)
Platelets: 87 10*3/uL — ABNORMAL LOW (ref 150–440)
RBC: 2.5 MIL/uL — AB (ref 4.40–5.90)
RDW: 17.2 % — ABNORMAL HIGH (ref 11.5–14.5)
WBC: 17.5 10*3/uL — ABNORMAL HIGH (ref 3.8–10.6)

## 2015-01-13 LAB — MAGNESIUM: Magnesium: 1.9 mg/dL (ref 1.7–2.4)

## 2015-01-13 MED ORDER — EPOETIN ALFA 20000 UNIT/ML IJ SOLN
14000.0000 [IU] | INTRAMUSCULAR | Status: DC
  Administered 2015-01-13: 10000 [IU] via SUBCUTANEOUS
  Administered 2015-01-15 – 2015-01-17 (×2): 14000 [IU] via SUBCUTANEOUS
  Filled 2015-01-13 (×3): qty 1

## 2015-01-13 MED ORDER — EPOETIN ALFA 10000 UNIT/ML IJ SOLN
10000.0000 [IU] | Freq: Once | INTRAMUSCULAR | Status: DC
Start: 2015-01-13 — End: 2015-01-13

## 2015-01-13 NOTE — Progress Notes (Signed)
Subjective:   More alert this morning. Poor insight. Has not touched his breakfast tray.    Objective:  Vital signs in last 24 hours:  Temp:  [98 F (36.7 C)-98.2 F (36.8 C)] 98 F (36.7 C) (08/01 0449) Pulse Rate:  [88-99] 94 (08/01 0449) Resp:  [18-20] 20 (08/01 0449) BP: (87-108)/(52-65) 108/65 mmHg (08/01 0449) SpO2:  [100 %] 100 % (08/01 0449) Weight:  [69.5 kg (153 lb 3.5 oz)] 69.5 kg (153 lb 3.5 oz) (08/01 0449)  Weight change: -0.2 kg (-7.1 oz) Filed Weights   01/11/15 0500 01/12/15 0347 01/13/15 0449  Weight: 71.124 kg (156 lb 12.8 oz) 69.7 kg (153 lb 10.6 oz) 69.5 kg (153 lb 3.5 oz)    Intake/Output: I/O last 3 completed shifts: In: 960 [P.O.:960] Out: 1126 [Urine:1125; Stool:1]   Intake/Output this shift:     Physical Exam: General: chronically ill appearing  Head: oral mucosa moist  Eyes: Anicteric  Neck: Supple, trachea midline  Lungs:  Clear to auscultation normal effort  Heart: S1S2 no rubs  Abdomen:  Soft, BS present  Extremities: No LE edema  Neurologic: Awake, alert, oriented today  Skin: No acute rashes  Access: [facility] permcath  01/10/15 Dr. Lucky Cowboy    Basic Metabolic Panel:  Recent Labs Lab 01/07/15 8250  01/09/15 0370 01/10/15 1333 01/11/15 4888 01/12/15 0437 01/13/15 0427  NA  --   < > 135 133* 138 140 138  K  --   < > 3.4* 3.2* 2.8* 2.5* 3.3*  CL  --   < > 100* 98* 99* 101 101  CO2  --   < > 22 22 27 26 25   GLUCOSE  --   < > 84 101* 106* 105* 91  BUN  --   < > 64* 68* 32* 35* 41*  CREATININE  --   < > 7.04* 7.11* 4.00* 4.91* 5.02*  CALCIUM  --   < > 7.8* 7.6*  7.7* 7.5* 7.9* 7.8*  MG 1.8  --  1.7  --   --  1.6* 1.9  PHOS  --   --  2.5 3.2  --   --   --   < > = values in this interval not displayed.  Liver Function Tests:  Recent Labs Lab 01/09/15 0752 01/10/15 1333  ALBUMIN 1.7* 1.6*   No results for input(s): LIPASE, AMYLASE in the last 168 hours. No results for input(s): AMMONIA in the last 168 hours.  CBC:  Recent  Labs Lab 01/09/15 0752 01/10/15 1333 01/11/15 0552 01/11/15 2107 01/12/15 0437 01/13/15 0427  WBC 17.1* 14.0* 15.5*  --  17.4* 17.5*  HGB 6.9* 6.3* 6.4* 7.9* 7.7* 7.5*  HCT 20.9* 19.1* 19.5* 24.0* 23.3* 22.1*  MCV 86.2 85.7 87.1  --  87.5 88.2  PLT 117* 118* 115*  --  108* 87*    Cardiac Enzymes: No results for input(s): CKTOTAL, CKMB, CKMBINDEX, TROPONINI in the last 168 hours.  BNP: Invalid input(s): POCBNP  CBG:  Recent Labs Lab 01/10/15 1713 01/10/15 2038 01/11/15 0158 01/11/15 1155 01/11/15 1622  GLUCAP 74 96 148* 130* 110*    Microbiology: Results for orders placed or performed during the hospital encounter of 11/29/14  MRSA PCR Screening     Status: None   Collection Time: 11/29/14 12:13 PM  Result Value Ref Range Status   MRSA by PCR NEGATIVE NEGATIVE Final    Comment:        The GeneXpert MRSA Assay (FDA approved for NASAL specimens only),  is one component of a comprehensive MRSA colonization surveillance program. It is not intended to diagnose MRSA infection nor to guide or monitor treatment for MRSA infections.   Culture, blood (routine x 2)     Status: None   Collection Time: 11/29/14  1:19 PM  Result Value Ref Range Status   Specimen Description BLOOD  Final   Special Requests Immunocompromised  Final   Culture NO GROWTH 5 DAYS  Final   Report Status 12/04/2014 FINAL  Final  Culture, blood (routine x 2)     Status: None   Collection Time: 11/29/14  3:04 PM  Result Value Ref Range Status   Specimen Description Blood  Final   Special Requests Immunocompromised  Final   Report Status 01/03/2015 FINAL  Final  Urine culture     Status: None   Collection Time: 11/30/14 10:12 AM  Result Value Ref Range Status   Specimen Description URINE, CATHETERIZED  Final   Special Requests Immunocompromised  Final   Culture NO GROWTH 2 DAYS  Final   Report Status 12/02/2014 FINAL  Final  Culture, blood (routine x 2)     Status: None   Collection Time:  12/06/14  8:08 PM  Result Value Ref Range Status   Specimen Description BLOOD  Final   Special Requests NONE  Final   Culture  Setup Time   Final    GRAM POSITIVE COCCI IN BOTH AEROBIC AND ANAEROBIC BOTTLES CRITICAL RESULT CALLED TO, READ BACK BY AND VERIFIED WITH: JENNIFER BEZARD AT 3154 12/08/14.PMH CONFIRMED BY [facility]    Culture   Final    STAPHYLOCOCCUS AURICULARIS IN BOTH AEROBIC AND ANAEROBIC BOTTLES    Report Status 12/11/2014 FINAL  Final   Organism ID, Bacteria STAPHYLOCOCCUS AURICULARIS  Final      Susceptibility   Staphylococcus auricularis - MIC*    CIPROFLOXACIN >=8 RESISTANT Resistant     ERYTHROMYCIN >=8 RESISTANT Resistant     GENTAMICIN <=0.5 SENSITIVE Sensitive     OXACILLIN >=4 RESISTANT Resistant     TETRACYCLINE <=1 SENSITIVE Sensitive     VANCOMYCIN 1 SENSITIVE Sensitive     CLINDAMYCIN <=0.25 SENSITIVE Sensitive     TRIMETH/SULFA Value in next row Sensitive      SENSITIVE<=20    LEVOFLOXACIN Value in next row Intermediate      INTERMEDIATE4    * STAPHYLOCOCCUS AURICULARIS  Culture, blood (routine x 2)     Status: None   Collection Time: 12/06/14  8:40 PM  Result Value Ref Range Status   Specimen Description BLOOD  Final   Special Requests NONE  Final   Culture NO GROWTH 5 DAYS  Final   Report Status 12/11/2014 FINAL  Final  Culture, blood (routine x 2)     Status: None   Collection Time: 12/08/14 11:40 AM  Result Value Ref Range Status   Specimen Description BLOOD  Final   Special Requests Normal  Final   Culture NO GROWTH 6 DAYS  Final   Report Status 12/14/2014 FINAL  Final  Culture, blood (routine x 2)     Status: None   Collection Time: 12/08/14 11:49 AM  Result Value Ref Range Status   Specimen Description BLOOD  Final   Special Requests Normal  Final   Culture NO GROWTH 6 DAYS  Final   Report Status 12/14/2014 FINAL  Final  C difficile quick scan w PCR reflex ([facility] only)     Status: None   Collection Time: 12/14/14 10:11 AM  Result Value  Ref Range Status   C Diff antigen NEGATIVE  Final   C Diff toxin NEGATIVE  Final   C Diff interpretation Negative for C. difficile  Final  Culture, blood (routine x 2)     Status: None   Collection Time: 12/27/14 11:28 AM  Result Value Ref Range Status   Specimen Description BLOOD  Final   Special Requests Immunocompromised  Final   Culture  Setup Time   Final    YEAST AEROBIC BOTTLE ONLY CRITICAL RESULT CALLED TO, READ BACK BY AND VERIFIED WITH: CATHY SUMMERLIN ON 12/29/14 AT 1000AM BY JEF    Culture CANDIDA PARAPSILOSIS AEROBIC BOTTLE ONLY   Final   Report Status 01/02/2015 FINAL  Final  Culture, blood (routine x 2)     Status: None   Collection Time: 12/27/14 11:36 AM  Result Value Ref Range Status   Specimen Description BLOOD  Final   Special Requests Immunocompromised  Final   Culture  Setup Time   Final    YEAST AEROBIC BOTTLE ONLY CRITICAL RESULT CALLED TO, READ BACK BY AND VERIFIED WITH: LISA ROMERO AT 0111 ON 12/29/14 [facility] CONFIRMED BY Center    Culture CANDIDA PARAPSILOSIS AEROBIC BOTTLE ONLY   Final   Report Status 01/02/2015 FINAL  Final  Cath Tip Culture     Status: None   Collection Time: 12/27/14  4:17 PM  Result Value Ref Range Status   Specimen Description CATH TIP  Final   Special Requests NONE  Final   Culture NO GROWTH 2 DAYS  Final   Report Status 12/30/2014 FINAL  Final  Culture, blood (routine x 2)     Status: None   Collection Time: 12/29/14 12:28 PM  Result Value Ref Range Status   Specimen Description BLOOD RIGHT ARM  Final   Special Requests   Final    BOTTLES DRAWN AEROBIC AND ANAEROBIC  AER 4CC ANA 3CC   Culture  Setup Time   Final    BUDDING YEAST SEEN AEROBIC BOTTLE ONLY CRITICAL RESULT CALLED TO, READ BACK BY AND VERIFIED WITH: BROKE ROBERTSON AT 2230 01/01/15.TSH CONFIRMED BY SDR    Culture CANDIDA PARAPSILOSIS AEROBIC BOTTLE ONLY   Final   Report Status 01/04/2015 FINAL  Final  Culture, blood (routine x 2)     Status: None    Collection Time: 12/29/14 12:28 PM  Result Value Ref Range Status   Specimen Description BLOOD RIGHT FATTY CASTS  Final   Special Requests BOTTLES DRAWN AEROBIC AND ANAEROBIC  5CC  Final   Culture  Setup Time   Final    YEAST AEROBIC BOTTLE ONLY CRITICAL VALUE NOTED.  VALUE IS CONSISTENT WITH PREVIOUSLY REPORTED AND CALLED VALUE.    Culture CANDIDA PARAPSILOSIS  Final   Report Status 01/04/2015 FINAL  Final  Cath Tip Culture     Status: None   Collection Time: 01/02/15 12:42 PM  Result Value Ref Range Status   Specimen Description CATH TIP  Final   Special Requests Normal  Final   Culture NO GROWTH 3 DAYS  Final   Report Status 01/05/2015 FINAL  Final  Culture, blood (routine x 2)     Status: None   Collection Time: 01/02/15  6:34 PM  Result Value Ref Range Status   Specimen Description BLOOD RIGHT HAND  Final   Special Requests 5 BOTTLES DRAWN AEROBIC AND ANAEROBIC 5ML  Final   Culture  Setup Time   Final    BUDDING YEAST SEEN ANAEROBIC  BOTTLE ONLY CRITICAL RESULT CALLED TO, READ BACK BY AND VERIFIED WITH: IRIS GUIDRY AT 2030 01/04/15 SDR  CONFIRMED BY RW    Culture CANDIDA PARAPSILOSIS  Final   Report Status 01/07/2015 FINAL  Final  Culture, blood (routine x 2)     Status: None   Collection Time: 01/05/15  8:59 AM  Result Value Ref Range Status   Specimen Description BLOOD RIGHT ASSIST CONTROL  Final   Special Requests BOTTLES DRAWN AEROBIC AND ANAEROBIC 1ML  Final   Culture NO GROWTH 5 DAYS  Final   Report Status 01/10/2015 FINAL  Final  Culture, blood (routine x 2)     Status: None   Collection Time: 01/05/15  9:10 AM  Result Value Ref Range Status   Specimen Description BLOOD LEFT HAND  Final   Special Requests BOTTLES DRAWN AEROBIC AND ANAEROBIC 1ML  Final   Culture NO GROWTH 5 DAYS  Final   Report Status 01/10/2015 FINAL  Final  Cath Tip Culture     Status: None   Collection Time: 01/05/15 11:30 AM  Result Value Ref Range Status   Specimen Description CATH TIP   Final   Special Requests NONE  Final   Culture PSEUDOMONAS AERUGINOSA KLEBSIELLA OXYTOCA   Final   Report Status 01/08/2015 FINAL  Final   Organism ID, Bacteria PSEUDOMONAS AERUGINOSA  Final   Organism ID, Bacteria KLEBSIELLA OXYTOCA  Final      Susceptibility   Klebsiella oxytoca - MIC*    AMPICILLIN >=32 RESISTANT Resistant     CEFTAZIDIME <=1 SENSITIVE Sensitive     CEFAZOLIN >=64 RESISTANT Resistant     CEFTRIAXONE 8 SENSITIVE Sensitive     CIPROFLOXACIN <=0.25 SENSITIVE Sensitive     GENTAMICIN <=1 SENSITIVE Sensitive     IMIPENEM <=0.25 SENSITIVE Sensitive     TRIMETH/SULFA <=20 SENSITIVE Sensitive     PIP/TAZO Value in next row Resistant      RESISTANT>=128    * KLEBSIELLA OXYTOCA   Pseudomonas aeruginosa - MIC*    CEFTAZIDIME Value in next row Sensitive      RESISTANT>=128    CIPROFLOXACIN Value in next row Resistant      RESISTANT>=128    GENTAMICIN Value in next row Sensitive      RESISTANT>=128    IMIPENEM Value in next row Resistant      RESISTANT>=128    PIP/TAZO Value in next row Sensitive      SENSITIVE8    * PSEUDOMONAS AERUGINOSA    Coagulation Studies: No results for input(s): LABPROT, INR in the last 72 hours.  Urinalysis: No results for input(s): COLORURINE, LABSPEC, PHURINE, GLUCOSEU, HGBUR, BILIRUBINUR, KETONESUR, PROTEINUR, UROBILINOGEN, NITRITE, LEUKOCYTESUR in the last 72 hours.  Invalid input(s): APPERANCEUR    Imaging: US Venous Img Upper Uni Left  01/13/2015   CLINICAL DATA:  Thrombophlebitis.  EXAM: Left UPPER EXTREMITY VENOUS DOPPLER ULTRASOUND  TECHNIQUE: Gray-scale sonography with graded compression, as well as color Doppler and duplex ultrasound were performed to evaluate the upper extremity deep venous system from the level of the subclavian vein and including the jugular, axillary, basilic, radial, ulnar and upper cephalic vein. Spectral Doppler was utilized to evaluate flow at rest and with distal augmentation maneuvers.  COMPARISON:   01/05/2015.  FINDINGS: Contralateral Subclavian Vein: Respiratory phasicity is normal and symmetric with the symptomatic side. No evidence of thrombus. Normal compressibility.  Internal Jugular Vein: No evidence of thrombus. Normal compressibility, respiratory phasicity and response to augmentation.  Subclavian Vein: No evidence of  thrombus. Normal compressibility, respiratory phasicity and response to augmentation.  Axillary Vein: No evidence of thrombus. Normal compressibility, respiratory phasicity and response to augmentation.  Cephalic Vein: Not visualized.  Basilic Vein: Thrombosis again noted throughout the basilic vein.  Brachial Veins: No evidence of thrombus. Normal compressibility, respiratory phasicity and response to augmentation.  Radial Veins: No evidence of thrombus. Normal compressibility, respiratory phasicity and response to augmentation.  Ulnar Veins: No evidence of thrombus. Normal compressibility, respiratory phasicity and response to augmentation.  Venous Reflux:  None visualized.  Other Findings: Soft tissue fullness of the left neck is subclavian region consistent with known adenopathy.  IMPRESSION: 1. Persistent superficial venous thrombosis noted in the basilic vein. 2. No evidence of DVT. 3. Soft tissue fullness in the left neck and subclavian region consistent with known adenopathy.   Electronically Signed   By: Sandy   On: 01/13/2015 09:01     Medications:     . amiodarone  200 mg Oral Daily  . antiseptic oral rinse  7 mL Mouth Rinse BID  . apixaban  2.5 mg Oral BID  . carvedilol  20 mg Oral Daily  . dronabinol  2.5 mg Oral BID AC  . epoetin (EPOGEN/PROCRIT) injection  14,000 Units Subcutaneous Q M,W,F-HD  . feeding supplement (NEPRO CARB STEADY)  237 mL Oral BID BM  . fluconazole  200 mg Oral Once per day on Sun Tue Thu Sat  . fluconazole  400 mg Oral Once per day on Mon Wed Fri  . mometasone-formoterol  2 puff Inhalation BID  . pantoprazole  40 mg Oral BID   . scopolamine  1 patch Transdermal Q72H   sodium chloride, sodium chloride, acetaminophen **OR** acetaminophen, albuterol, haloperidol lactate, heparin, lidocaine (PF), lidocaine-prilocaine, magnesium hydroxide, metoprolol, ondansetron (ZOFRAN) IV, promethazine, ziprasidone  Assessment/ Plan:  Mr. Puthoff is a 57 black male with progressive mantle cell lymphoma, RICE chemo in 11/2014, hx left sided hydronephrosis and hydroureter. S/p ureteral stent placement   Hospital course complicated by sepsis with Klebsiella oxytoca, hypernatremia. Now with fungemia.   1. End Stage Renal Failure:  Acute renal failure due to ATN and now without recovery.  - Hemodialysis on MWF. Will monitor for dialysis need daily. Next treatment for later today, - Urine output is nonoliguric.   2.  Sepsis with fungemia. A41.9, B49. Status post bacteremia with klebsiella oxytoca: completed two weeks for IV ceftazidime. Subsequently growing candida parapsilosis.  Subcutaneous port removed 01/02/15. - blood cultures from 7/21 now showing yeast despite micafungin.   - 7/24 culture: klebsiella and pseudomonas on cath tip - Appreciate ID input.  Currently on fluconazole.   3.  Anemia of chronic kidney disease/mantle cell lymphoma:  - status post transfusion 1 unit PRBC on 7/30 and 7/27 - continue epogen 14000 units IV with HD, use OK'd by hematology/onc.   4. Secondary Hyperparathyroidism: PTH 126. Phos 2.3.  - not on binders. No indication for activated vitamin D   5. Hypokalemia and hypomagnesemia: not with diarrhea nor diuretics.  Concerned for poor PO intake. - IV electrolytes replacement yesterday   LOS: [place], [place] 8/1/20169:23 AM

## 2015-01-13 NOTE — Progress Notes (Signed)
PRE HD   

## 2015-01-13 NOTE — Care Management Important Message (Signed)
Important Message  Patient Details  Name: Caleb Francis MRN: [id number] Date of Birth: Mar 29, 1946   Medicare Important Message Given:  Yes-third notification given    [name] 01/13/2015, 10:10 AM

## 2015-01-13 NOTE — Progress Notes (Signed)
HD START 

## 2015-01-13 NOTE — Progress Notes (Signed)
[facility] [place] NAME: Caleb Francis    MR#:  [id number]  DATE OF BIRTH:  1945-07-12  SUBJECTIVE:  better oral intake. REVIEW OF SYSTEMS:   Review of Systems  Constitutional: Positive for malaise/fatigue. Negative for fever, chills and weight loss.  HENT: Negative for ear discharge, ear pain and nosebleeds.   Eyes: Negative for blurred vision, pain and discharge.  Respiratory: Negative for sputum production, shortness of breath, wheezing and stridor.   Cardiovascular: Negative for chest pain, palpitations, orthopnea and PND.  Gastrointestinal: Negative for nausea, vomiting, abdominal pain and diarrhea.  Genitourinary: Negative for urgency and frequency.  Musculoskeletal: Negative for back pain and joint pain.  Neurological: Positive for weakness. Negative for sensory change, speech change and focal weakness.  Psychiatric/Behavioral: Negative for depression. The patient is not nervous/anxious.    Tolerating Diet:very little Tolerating PT: eval pending   DRUG ALLERGIES:  No Known Allergies VITALS:  Blood pressure 104/63, pulse 90, temperature 98.2 F (36.8 C), temperature source Oral, resp. rate 16, height 6' (1.829 m), weight 69.5 kg (153 lb 3.5 oz), SpO2 100 %. PHYSICAL EXAMINATION:   Physical Exam GENERAL:  69 y.o.-year-old patient lying in the bed with no acute distress. Appears chronically ill EYES: Pupils equal, round, reactive to light and accommodation. No scleral icterus. Extraocular muscles intact.  HEENT: Head atraumatic, normocephalic. Oropharynx and nasopharynx clear.  NECK:  Supple, no jugular venous distention. No thyroid enlargement, no tenderness.  LUNGS: Normal breath sounds bilaterally, no wheezing, rales, rhonchi. No use of accessory muscles of respiration.  CARDIOVASCULAR: S1, S2 normal. No murmurs, rubs, or gallops.  ABDOMEN: Soft, nontender, mild distended. Bowel sounds present. No organomegaly or  mass.  EXTREMITIES: No cyanosis, clubbing or edema b/l.  NEUROLOGIC: Cranial nerves II through XII are intact. No focal Motor or sensory deficits b/l.  Weak,severe deconditioning PSYCHIATRIC:  patient is alert and oriented x 3.  SKIN: stage II sacral ulcer.  LABORATORY PANEL:   CBC  Recent Labs Lab 01/13/15 0427  WBC 17.5*  HGB 7.5*  HCT 22.1*  PLT 87*    Chemistries   Recent Labs Lab 01/13/15 0427  NA 138  K 3.3*  CL 101  CO2 25  GLUCOSE 91  BUN 41*  CREATININE 5.02*  CALCIUM 7.8*  MG 1.9   ASSESSMENT AND PLAN:   #1 sepsis: Fungemia with Candida parapsilosis blood cultures 7/15, and again on repeat culture 7/17 + and 7/21 + Appreciate infectious disease consultation.  continue micafungin to PO fluconazole 400 mg PO for total 4 weeks. * Leukocytosis. Worsening. Follow-up CBC.  - Port-A-Cath removed on 7/21 by Dr. Lucky Cowboy. Cath tip growing Klebsiella and Pseudomonas (resulted on 7/27) which is likely not significant per ID so not recommending any abx for same unless clinical condition changes and blood c/s turns +  #2 acute renal failure due to ATN-->now progressed to ESRD s/p permcath on 7/29, continue HD on M/W/F per Dr. Juleen China.   * Anemia of chronic disease. Hb 7.5 today, no active bleeding, s/p total 4 unit of PRBC, f/u Hb in am.  continue epogen 14000 units IV with HD.  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 No further bleeding. NG tube and rectal tube discontinued 7/12 - Continue PPI, high risk for further bleeding due to thrombocytopenia - Last seen by GI on July 4. No further plans for endoscopy.  #5 deconditioning: Unable to  participate in physical therapy due to poor motivation and severe deconditioning. PT to see if he improves  #6 Severe protein calorie malnutrition: Continue Marinol.  Dr. Manuella Ghazi discussed with family and the patient they would not be interested in PEG tube placement. pt's calorie count now stopped due to severe poor po intake. Encourage intake.  #7 encephalopathy: Appreciate neurology consultation. This is likely encephalopathy/delirium due to critical illness. Agree with when necessary Geodon for agitation. Has not been agitated  improved to baseline.  #8 pressure ulcers: Wound care.  #9 left basilic vein superficial extensive thrombosis On eliquis 2.5 mg bid and repeat US upper extremity shows Persistent superficial venous thrombosis noted in the basilic vein.  * Hypokalemia and hypomagnesemia. Better after given potassium and magnesium replacement. F/u BMP.    CODE STATUS: DO NOT RESUSCITATE  Case discussed with Care Management/Social Worker. Management plans discussed with the patient, CSW, Dr. Juleen China.  Physical therapy was not able to sit up for greater than one minute on the side of the bed without max assist. Will be a very challenging case with long term stay and difficult discharge.  PT evaluation suggested skilled nursing facility placement.  TOTAL TIME TAKING CARE OF THIS PATIENT: 38 minutes.   More than 50% of the time was spent in counseling/coordination of care.   [name] M.D on 01/13/2015 at 12:46 PM  Between 7am to 6pm - Pager - [phone]  After 6pm go to [email or web address] - password [email or web address] [facility]  [facility]  Office  [phone]  CC: Primary care physician; No PCP Per Patient

## 2015-01-13 NOTE — Progress Notes (Signed)
ANTIBIOTIC CONSULT NOTE  Pharmacy Consult for renal dose adjustment of fluconazole Indication: Fungemia - candida parapsilosis   No Known Allergies  Patient Measurements: Height: 6' (182.9 cm) Weight: 154 lb 15.7 oz (70.3 kg) IBW/kg (Calculated) : 77.6  Vital Signs: Temp: 97.6 F (36.4 C) (08/01 1256) Temp Source: Axillary (08/01 1256) BP: 98/60 mmHg (08/01 1430) Pulse Rate: 83 (08/01 1430) Intake/Output from previous day: 07/31 0701 - 08/01 0700 In: 480 [P.O.:480] Out: 600 [Urine:600] Intake/Output from this shift: Total I/O In: -  Out: 350 [Urine:350]  Labs:  Recent Labs  01/11/15 0552 01/11/15 2107 01/12/15 0437 01/13/15 0427  WBC 15.5*  --  17.4* 17.5*  HGB 6.4* 7.9* 7.7* 7.5*  PLT 115*  --  108* 87*  CREATININE 4.00*  --  4.91* 5.02*   Estimated Creatinine Clearance: 14.2 mL/min (by C-G formula based on Cr of 5.02). No results for input(s): VANCOTROUGH, VANCOPEAK, VANCORANDOM, GENTTROUGH, GENTPEAK, GENTRANDOM, TOBRATROUGH, TOBRAPEAK, TOBRARND, AMIKACINPEAK, AMIKACINTROU, AMIKACIN in the last 72 hours.   Microbiology: Recent Results (from the past 720 hour(s))  Culture, blood (routine x 2)     Status: None   Collection Time: 12/27/14 11:28 AM  Result Value Ref Range Status   Specimen Description BLOOD  Final   Special Requests Immunocompromised  Final   Culture  Setup Time   Final    YEAST AEROBIC BOTTLE ONLY CRITICAL RESULT CALLED TO, READ BACK BY AND VERIFIED WITH: CATHY SUMMERLIN ON 12/29/14 AT 1000AM BY JEF    Culture CANDIDA PARAPSILOSIS AEROBIC BOTTLE ONLY   Final   Report Status 01/02/2015 FINAL  Final  Culture, blood (routine x 2)     Status: None   Collection Time: 12/27/14 11:36 AM  Result Value Ref Range Status   Specimen Description BLOOD  Final   Special Requests Immunocompromised  Final   Culture  Setup Time   Final    YEAST AEROBIC BOTTLE ONLY CRITICAL RESULT CALLED TO, READ BACK BY AND VERIFIED WITH: LISA ROMERO AT 0111 ON 12/29/14  RWW CONFIRMED BY [place]    Culture CANDIDA PARAPSILOSIS AEROBIC BOTTLE ONLY   Final   Report Status 01/02/2015 FINAL  Final  Cath Tip Culture     Status: None   Collection Time: 12/27/14  4:17 PM  Result Value Ref Range Status   Specimen Description CATH TIP  Final   Special Requests NONE  Final   Culture NO GROWTH 2 DAYS  Final   Report Status 12/30/2014 FINAL  Final  Culture, blood (routine x 2)     Status: None   Collection Time: 12/29/14 12:28 PM  Result Value Ref Range Status   Specimen Description BLOOD RIGHT ARM  Final   Special Requests   Final    BOTTLES DRAWN AEROBIC AND ANAEROBIC  AER 4CC ANA 3CC   Culture  Setup Time   Final    BUDDING YEAST SEEN AEROBIC BOTTLE ONLY CRITICAL RESULT CALLED TO, READ BACK BY AND VERIFIED WITH: BROKE ROBERTSON AT 2230 01/01/15.TSH CONFIRMED BY SDR    Culture CANDIDA PARAPSILOSIS AEROBIC BOTTLE ONLY   Final   Report Status 01/04/2015 FINAL  Final  Culture, blood (routine x 2)     Status: None   Collection Time: 12/29/14 12:28 PM  Result Value Ref Range Status   Specimen Description BLOOD RIGHT FATTY CASTS  Final   Special Requests BOTTLES DRAWN AEROBIC AND ANAEROBIC  5CC  Final   Culture  Setup Time   Final    YEAST AEROBIC BOTTLE  ONLY CRITICAL VALUE NOTED.  VALUE IS CONSISTENT WITH PREVIOUSLY REPORTED AND CALLED VALUE.    Culture CANDIDA PARAPSILOSIS  Final   Report Status 01/04/2015 FINAL  Final  Cath Tip Culture     Status: None   Collection Time: 01/02/15 12:42 PM  Result Value Ref Range Status   Specimen Description CATH TIP  Final   Special Requests Normal  Final   Culture NO GROWTH 3 DAYS  Final   Report Status 01/05/2015 FINAL  Final  Culture, blood (routine x 2)     Status: None   Collection Time: 01/02/15  6:34 PM  Result Value Ref Range Status   Specimen Description BLOOD RIGHT HAND  Final   Special Requests 5 BOTTLES DRAWN AEROBIC AND ANAEROBIC 5ML  Final   Culture  Setup Time   Final    BUDDING YEAST  SEEN ANAEROBIC BOTTLE ONLY CRITICAL RESULT CALLED TO, READ BACK BY AND VERIFIED WITH: IRIS GUIDRY AT 2030 01/04/15 SDR  CONFIRMED BY RW    Culture CANDIDA PARAPSILOSIS  Final   Report Status 01/07/2015 FINAL  Final  Culture, blood (routine x 2)     Status: None   Collection Time: 01/05/15  8:59 AM  Result Value Ref Range Status   Specimen Description BLOOD RIGHT ASSIST CONTROL  Final   Special Requests BOTTLES DRAWN AEROBIC AND ANAEROBIC 1ML  Final   Culture NO GROWTH 5 DAYS  Final   Report Status 01/10/2015 FINAL  Final  Culture, blood (routine x 2)     Status: None   Collection Time: 01/05/15  9:10 AM  Result Value Ref Range Status   Specimen Description BLOOD LEFT HAND  Final   Special Requests BOTTLES DRAWN AEROBIC AND ANAEROBIC 1ML  Final   Culture NO GROWTH 5 DAYS  Final   Report Status 01/10/2015 FINAL  Final  Cath Tip Culture     Status: None   Collection Time: 01/05/15 11:30 AM  Result Value Ref Range Status   Specimen Description CATH TIP  Final   Special Requests NONE  Final   Culture PSEUDOMONAS AERUGINOSA KLEBSIELLA OXYTOCA   Final   Report Status 01/08/2015 FINAL  Final   Organism ID, Bacteria PSEUDOMONAS AERUGINOSA  Final   Organism ID, Bacteria KLEBSIELLA OXYTOCA  Final      Susceptibility   Klebsiella oxytoca - MIC*    AMPICILLIN >=32 RESISTANT Resistant     CEFTAZIDIME <=1 SENSITIVE Sensitive     CEFAZOLIN >=64 RESISTANT Resistant     CEFTRIAXONE 8 SENSITIVE Sensitive     CIPROFLOXACIN <=0.25 SENSITIVE Sensitive     GENTAMICIN <=1 SENSITIVE Sensitive     IMIPENEM <=0.25 SENSITIVE Sensitive     TRIMETH/SULFA <=20 SENSITIVE Sensitive     PIP/TAZO Value in next row Resistant      RESISTANT>=128    * KLEBSIELLA OXYTOCA   Pseudomonas aeruginosa - MIC*    CEFTAZIDIME Value in next row Sensitive      RESISTANT>=128    CIPROFLOXACIN Value in next row Resistant      RESISTANT>=128    GENTAMICIN Value in next row Sensitive      RESISTANT>=128    IMIPENEM  Value in next row Resistant      RESISTANT>=128    PIP/TAZO Value in next row Sensitive      SENSITIVE8    * PSEUDOMONAS AERUGINOSA    Medical History: Past Medical History  Diagnosis Date  . Arthritis   . Hypertension   . RA (rheumatoid arthritis)   .  Anemia   . Mantle cell lymphoma     Dr Mike Gip  . History of nuclear stress test     a. 12/2013: low risk, no sig ischemia, no EKG changes, no artifact, EF 63%  . Chronic kidney disease   . Sepsis     Dr Ola Spurr    Medications:  Anti-infectives    Start     Dose/Rate Route Frequency Ordered Stop   01/11/15 1800  fluconazole (DIFLUCAN) tablet 200 mg     200 mg Oral Once per day on Sun Tue Thu Sat 01/10/15 1423 02/02/15 2359   01/10/15 1800  fluconazole (DIFLUCAN) tablet 400 mg     400 mg Oral Once per day on Mon Wed Fri 01/10/15 1400 02/02/15 2359   01/10/15 1230  fluconazole (DIFLUCAN) tablet 400 mg  Status:  Discontinued     400 mg Oral Daily 01/10/15 1219 01/10/15 1400   01/02/15 1600  ceFAZolin (ANCEF) IVPB 1 g/50 mL premix     1 g 100 mL/hr over 30 Minutes Intravenous  Once 01/02/15 1550 01/02/15 1621   12/31/14 0000  vancomycin (VANCOCIN) IVPB 750 mg/150 ml premix  Status:  Discontinued     750 mg 150 mL/hr over 60 Minutes Intravenous Once per day on Mon Thu Sat 12/30/14 1126 12/30/14 1131   12/30/14 1200  vancomycin (VANCOCIN) IVPB 750 mg/150 ml premix  Status:  Discontinued     750 mg 150 mL/hr over 60 Minutes Intravenous Once per day on Mon Thu Sat 12/29/14 0903 12/30/14 1126   12/30/14 1129  vancomycin (VANCOCIN) IVPB 750 mg/150 ml premix  Status:  Discontinued    Comments:  Administer dose during last hour of dialysis as long as pre-dialysis random level is less then 20 mcg/mL   750 mg 150 mL/hr over 60 Minutes Intravenous Every Dialysis 12/30/14 1131 12/30/14 1527   12/29/14 1130  micafungin (MYCAMINE) 100 mg in sodium chloride 0.9 % 100 mL IVPB  Status:  Discontinued     100 mg 100 mL/hr over 1 Hours  Intravenous Daily 12/29/14 1117 01/10/15 1219   12/28/14 1200  vancomycin (VANCOCIN) IVPB 750 mg/150 ml premix  Status:  Discontinued     750 mg 150 mL/hr over 60 Minutes Intravenous Once per day on Tue Thu Sat 12/27/14 1210 12/29/14 0903   12/27/14 1800  fluconazole (DIFLUCAN) IVPB 200 mg  Status:  Discontinued     200 mg 100 mL/hr over 60 Minutes Intravenous Every 24 hours 12/27/14 1402 12/27/14 1404   12/27/14 1800  fluconazole (DIFLUCAN) IVPB 100 mg  Status:  Discontinued     100 mg 50 mL/hr over 60 Minutes Intravenous Every 24 hours 12/27/14 1404 12/29/14 1117   12/27/14 1400  piperacillin-tazobactam (ZOSYN) IVPB 3.375 g  Status:  Discontinued     3.375 g 12.5 mL/hr over 240 Minutes Intravenous 3 times per day 12/27/14 1049 12/27/14 1210   12/27/14 1300  vancomycin (VANCOCIN) 1,500 mg in sodium chloride 0.9 % 500 mL IVPB     1,500 mg 250 mL/hr over 120 Minutes Intravenous  Once 12/27/14 1210 12/27/14 1521   12/27/14 1230  piperacillin-tazobactam (ZOSYN) IVPB 3.375 g  Status:  Discontinued     3.375 g 12.5 mL/hr over 240 Minutes Intravenous Every 12 hours 12/27/14 1210 12/30/14 1527   12/27/14 1100  vancomycin (VANCOCIN) powder 1,000 mg  Status:  Discontinued     1,000 mg Other To Surgery 12/27/14 1049 12/27/14 1119   12/27/14 1100  vancomycin (VANCOCIN) IVPB 1000  mg/200 mL premix  Status:  Discontinued     1,000 mg 200 mL/hr over 60 Minutes Intravenous Every 24 hours 12/27/14 1049 12/27/14 1210   12/27/14 0000  ceFAZolin (ANCEF) IVPB 1 g/50 mL premix    Comments:  Send with pt to OR   1 g 100 mL/hr over 30 Minutes Intravenous On call 12/26/14 2208 12/28/14 0000   12/19/14 1800  vancomycin (VANCOCIN) IVPB 750 mg/150 ml premix     750 mg 150 mL/hr over 60 Minutes Intravenous  Once 12/19/14 1515 12/19/14 1940   12/15/14 0630  vancomycin (VANCOCIN) IVPB 750 mg/150 ml premix     750 mg 150 mL/hr over 60 Minutes Intravenous  Once 12/15/14 0616 12/15/14 0726   12/14/14 1300   vancomycin (VANCOCIN) IVPB 750 mg/150 ml premix  Status:  Discontinued     750 mg 150 mL/hr over 60 Minutes Intravenous Every 48 hours 12/13/14 1107 12/14/14 0604   12/14/14 0603  vancomycin (VANCOCIN) IVPB 750 mg/150 ml premix  Status:  Discontinued     750 mg 150 mL/hr over 60 Minutes Intravenous Daily PRN 12/14/14 0604 12/21/14 1110   12/09/14 0900  erythromycin 250 mg in sodium chloride 0.9 % 100 mL IVPB  Status:  Discontinued     250 mg 100 mL/hr over 60 Minutes Intravenous 3 times per day 12/09/14 0859 12/13/14 1054   12/08/14 1100  metroNIDAZOLE (FLAGYL) IVPB 500 mg  Status:  Discontinued     500 mg 100 mL/hr over 60 Minutes Intravenous Every 8 hours 12/08/14 0958 12/11/14 1048   12/08/14 0900  vancomycin (VANCOCIN) IVPB 750 mg/150 ml premix  Status:  Discontinued     750 mg 150 mL/hr over 60 Minutes Intravenous Every 24 hours 12/07/14 1102 12/12/14 0955   12/07/14 1030  cefTAZidime (FORTAZ) 2 g in dextrose 5 % 50 mL IVPB  Status:  Discontinued     2 g 100 mL/hr over 30 Minutes Intravenous Every 24 hours 12/07/14 1021 12/11/14 1048   12/07/14 0700  vancomycin (VANCOCIN) IVPB 1000 mg/200 mL premix  Status:  Discontinued     1,000 mg 200 mL/hr over 60 Minutes Intravenous Every 24 hours 12/06/14 2225 12/07/14 1102   12/06/14 2200  piperacillin-tazobactam (ZOSYN) IVPB 4.5 g  Status:  Discontinued     4.5 g 200 mL/hr over 30 Minutes Intravenous 3 times per day 12/06/14 1946 12/07/14 1011   12/06/14 2000  vancomycin (VANCOCIN) IVPB 1000 mg/200 mL premix     1,000 mg 200 mL/hr over 60 Minutes Intravenous STAT 12/06/14 1947 12/06/14 2113   12/06/14 2000  cefTRIAXone (ROCEPHIN) 1 g in dextrose 5 % 50 mL IVPB - Premix  Status:  Discontinued     1 g 100 mL/hr over 30 Minutes Intravenous Every 24 hours 12/06/14 1959 12/07/14 1011   12/06/14 1945  piperacillin-tazobactam (ZOSYN) IVPB 3.375 g  Status:  Discontinued     3.375 g 100 mL/hr over 30 Minutes Intravenous 4 times per day 12/06/14  1940 12/06/14 2007   12/06/14 1945  vancomycin (VANCOCIN) IVPB 1000 mg/200 mL premix  Status:  Discontinued     1,000 mg 200 mL/hr over 60 Minutes Intravenous Every 12 hours 12/06/14 1940 12/06/14 2008   12/01/14 0945  piperacillin-tazobactam (ZOSYN) IVPB 3.375 g  Status:  Discontinued     3.375 g 12.5 mL/hr over 240 Minutes Intravenous 3 times per day 12/01/14 0936 12/03/14 1337   11/30/14 1000  cefTRIAXone (ROCEPHIN) 2 g in dextrose 5 % 50 mL IVPB -  Premix  Status:  Discontinued    Comments:  Entered per MD progress note   2 g 100 mL/hr over 30 Minutes Intravenous Every 24 hours 11/29/14 2157 12/06/14 1940   11/29/14 1415  cefTRIAXone (ROCEPHIN) 1 g in dextrose 5 % 50 mL IVPB - Premix     1 g 100 mL/hr over 30 Minutes Intravenous  Once 11/29/14 1406 11/29/14 1452   11/29/14 1414  cefTRIAXone (ROCEPHIN) 20 MG/ML IVPB 50 mL    Comments:  MONAR, NELLIE: cabinet override      11/29/14 1414 11/29/14 1424     Assessment: Pharmacy consulted to adjust fluconazole dose for renal function in this 69 year old male with ESRD on HD 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  Per ID will plan to treat for 4 week since first negative blood culture on 7/24  Plan:  Current orders for fluconazole 400mg  PO daily. Will adjust for renal function to 400mg  PO QPM on HD days and 200mg  PO QPM on all other days. Doses to be given after dialysis. Scheduled to stop on 8/21, 4 weeks from negative blood culture.  Patient tentatively scheduled for HD on MWF, however nephrology plans to evaluate daily for dialysis need. Will order doses according to current HD schedule (MWF). Pharmacy will need to follow closely and adjust dose schedule if HD schedule changes or extra sessions needed.  Rexene Edison, PharmD Clinical Pharmacist  01/13/2015,3:19 PM

## 2015-01-13 NOTE — Plan of Care (Signed)
Problem: Phase III Progression Outcomes Goal: Activity at appropriate level-compared to baseline (UP IN CHAIR FOR HEMODIALYSIS)  Outcome: Not Progressing Pt is alert and oriented with confusion at times, denies pain, bed ridden, poor appetite, dialysis performed, using urinal, incontinent at times, no bm throughout shift, son at bedside, resting in between care, potassium serum improved, BUN and creatinine worsened since previous day, WBC remains elevated receiving po antibiotics. Discharge plans still remain uncertain.

## 2015-01-13 NOTE — Care Management (Signed)
Physical therapy was not able to sit up for greater than one minute on the side of the bed without max assist.  Reinforced the need that patient must be able to sit up for dialysis.  If continues to require dialysis and not able to sit up for the duration of the treatment, would require out of state placement for stretcher dialysis. Patient is still not eating. There has not been forward progression in patient status.

## 2015-01-13 NOTE — Progress Notes (Signed)
ID E note Reviewed notes from renal and medicine. No fevers. WBC stably elevated REc Cont fluconazole for 4 weeks from first negative bcx

## 2015-01-13 NOTE — Progress Notes (Signed)
PT Attempt Note  Patient Details Name: CAMERYN CHRISLEY MRN: [id number] DOB: 02-13-1946   Cancelled Treatment:    Reason Eval/Treat Not Completed: Patient at procedure or test/unavailable Pt out of room for dialysis. Will attempt on later date/time as patient is available.  Lyndel Safe [place] PT, DPT   Joaquina Nissen 01/13/2015, 3:46 PM

## 2015-01-14 LAB — BASIC METABOLIC PANEL
ANION GAP: 10 (ref 5–15)
BUN: 30 mg/dL — ABNORMAL HIGH (ref 6–20)
CO2: 28 mmol/L (ref 22–32)
CREATININE: 3.93 mg/dL — AB (ref 0.61–1.24)
Calcium: 7.8 mg/dL — ABNORMAL LOW (ref 8.9–10.3)
Chloride: 101 mmol/L (ref 101–111)
GFR calc Af Amer: 17 mL/min — ABNORMAL LOW (ref >60)
GFR calc non Af Amer: 14 mL/min — ABNORMAL LOW (ref >60)
Glucose, Bld: 98 mg/dL (ref 65–99)
Potassium: 3 mmol/L — ABNORMAL LOW (ref 3.5–5.1)
SODIUM: 139 mmol/L (ref 135–145)

## 2015-01-14 LAB — CBC
HCT: 21.7 % — ABNORMAL LOW (ref 40.0–52.0)
HEMOGLOBIN: 7.1 g/dL — AB (ref 13.0–18.0)
MCH: 29.1 pg (ref 26.0–34.0)
MCHC: 32.8 g/dL (ref 32.0–36.0)
MCV: 88.7 fL (ref 80.0–100.0)
Platelets: 80 10*3/uL — ABNORMAL LOW (ref 150–440)
RBC: 2.45 MIL/uL — ABNORMAL LOW (ref 4.40–5.90)
RDW: 17.4 % — AB (ref 11.5–14.5)
WBC: 14.7 10*3/uL — ABNORMAL HIGH (ref 3.8–10.6)

## 2015-01-14 NOTE — Progress Notes (Signed)
Caleb Francis at [place] NAME: Caleb Francis    MR#:  [id number]  DATE OF BIRTH:  1945/08/14  SUBJECTIVE:  Still poor oral intake. No complaint but he is still unable to sit up. REVIEW OF SYSTEMS:   Review of Systems  Constitutional: Positive for malaise/fatigue. Negative for fever, chills and weight loss.  HENT: Negative for ear discharge, ear pain and nosebleeds.   Eyes: Negative for blurred vision, pain and discharge.  Respiratory: Negative for sputum production, shortness of breath, wheezing and stridor.   Cardiovascular: Negative for chest pain, palpitations, orthopnea and PND.  Gastrointestinal: Negative for nausea, vomiting, abdominal pain and diarrhea.  Genitourinary: Negative for urgency and frequency.  Musculoskeletal: Negative for back pain and joint pain.  Neurological: Positive for weakness. Negative for sensory change, speech change and focal weakness.  Psychiatric/Behavioral: Negative for depression. The patient is not nervous/anxious.    Tolerating Diet:very little Tolerating PT: eval pending   DRUG ALLERGIES:  No Known Allergies VITALS:  Blood pressure 103/56, pulse 97, temperature 98.1 F (36.7 C), temperature source Oral, resp. rate 18, height 6' (1.829 m), weight 70.444 kg (155 lb 4.8 oz), SpO2 100 %. PHYSICAL EXAMINATION:   Physical Exam GENERAL:  69 y.o.-year-old patient lying in the bed with no acute distress. Appears chronically ill EYES: Pupils equal, round, reactive to light and accommodation. No scleral icterus. Extraocular muscles intact.  HEENT: Head atraumatic, normocephalic. Oropharynx and nasopharynx clear.  NECK:  Supple, no jugular venous distention. No thyroid enlargement, no tenderness.  LUNGS: Normal breath sounds bilaterally, no wheezing, rales, rhonchi. No use of accessory muscles of respiration.  CARDIOVASCULAR: S1, S2 normal. No murmurs, rubs, or gallops.  ABDOMEN: Soft, nontender, mild  distended. Bowel sounds present. No organomegaly or mass.  EXTREMITIES: No cyanosis, clubbing or edema b/l.  NEUROLOGIC: Cranial nerves II through XII are intact. No focal Motor or sensory deficits b/l.  Weak,severe deconditioning PSYCHIATRIC:  patient is alert and oriented x 3.  SKIN: stage II sacral ulcer.  LABORATORY PANEL:   CBC  Recent Labs Lab 01/14/15 0427  WBC 14.7*  HGB 7.1*  HCT 21.7*  PLT 80*    Chemistries   Recent Labs Lab 01/13/15 0427 01/14/15 0427  NA 138 139  K 3.3* 3.0*  CL 101 101  CO2 25 28  GLUCOSE 91 98  BUN 41* 30*  CREATININE 5.02* 3.93*  CALCIUM 7.8* 7.8*  MG 1.9  --    ASSESSMENT AND PLAN:   #1 sepsis: Fungemia with Candida parapsilosis blood cultures 7/15, and again on repeat culture 7/17 + and 7/21 + Appreciate infectious disease consultation.  continue PO fluconazole 400 mg PO for total 4 weeks from first negative bcx per Dr. Ola Francis.       . * Leukocytosis. better. Follow-up CBC.  - Port-A-Cath removed on 7/21 by Dr. Lucky Francis. Cath tip growing Klebsiella and Pseudomonas (resulted on 7/27) which is likely not significant per ID so not recommending any abx for same unless clinical condition changes and blood c/s turns +  #2 acute renal failure due to ATN,  progressed to ESRD s/p permcath on 7/29, continue HD on M/W/F per Dr. Juleen Francis.   * Anemia of chronic disease. Hb down 7.1 today, no active bleeding, s/p total 4 unit of PRBC, f/u Hb in am.  continue epogen 14000 units IV with HD.  #3 Mantle cell lymphoma -recvd Neupogen, status post 2 units packed red blood cells on 6/25 and 1 unit  on 7/2 - Prognosis is poor.-followed by Dr Caleb Francis -at present given overall pt's decline and varioius infections not sure if pt is even a candidate for further Chemotherapy - further mgmt per Onco   #4 Chronic ileus with h/o GI bleeding: Clinically improving. No further bleeding. NG tube and rectal tube discontinued 7/12 - Continue PPI, high risk  for further bleeding due to thrombocytopenia - Last seen by GI on July 4. No further plans for endoscopy.  #5 deconditioning: Unable to participate in physical therapy due to poor motivation and severe deconditioning. PT to see if he improves  #6 Severe protein calorie malnutrition: Continue Marinol.  Dr. Manuella Francis discussed with family and the patient they would not be interested in PEG tube placement. pt's calorie count now stopped due to severe poor po intake. Encourage intake.  #7 encephalopathy: Appreciate neurology consultation. This is likely encephalopathy/delirium due to critical illness. Agree with when necessary Geodon for agitation. Has not been agitated  improved to baseline.  #8 pressure ulcers: Wound care.  #9 left basilic vein superficial extensive thrombosis On eliquis 2.5 mg bid and repeat US upper extremity shows Persistent superficial venous thrombosis noted in the basilic vein.  * Hypokalemia and hypomagnesemia. Concerned for poor PO intake. - 4 K bath on HD per Dr. Juleen Francis.  CODE STATUS: DO NOT RESUSCITATE  Case discussed with Care Management/Social Worker. Management plans discussed with the patient, CSW, Dr. Juleen Francis.  Physical therapy was not able to sit up for greater than one minute on the side of the bed without max assist. Will be a very challenging case with long term stay and difficult discharge.  PT evaluation suggested skilled nursing facility placement. Per CM, may need out of state placement.  TOTAL TIME TAKING CARE OF THIS PATIENT: 39 minutes.   More than 50% of the time was spent in counseling/coordination of care.   Caleb Francis M.D on 01/14/2015 at 2:21 PM  Between 7am to 6pm - Pager - [phone]  After 6pm go to [email or web address] - password [email or web address] [facility]  [facility]  Office  [phone]  CC: Primary care physician; No PCP Per Patient

## 2015-01-14 NOTE — Progress Notes (Signed)
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    PT Comments    Pt agreeable to PT with gentle encouragement, but only bed exercises. Pt demonstrates good range; fatigues quickly with some exercises requiring active assisted range of motion for last several repetitions for the exercises. Encouraged patient to perform LE exercises several times a day. Pt states he understands.   Follow Up Recommendations  SNF     Equipment Recommendations       Recommendations for Other Services       Precautions / Restrictions Precautions Precautions: Fall Restrictions Weight Bearing Restrictions: No    Mobility  Bed Mobility               General bed mobility comments: Pt refused out of bed; notes he just returned to bed from chair  Transfers                 General transfer comment: Not tested  Ambulation/Gait                 Stairs            Wheelchair Mobility    Modified Rankin (Stroke Patients Only)       Balance                                    Cognition Arousal/Alertness: Awake/alert Behavior During Therapy: WFL for tasks assessed/performed Overall Cognitive Status: Within Functional Limits for tasks assessed                      Exercises General Exercises - Lower Extremity Ankle Circles/Pumps: AROM;Both;15 reps;Supine Quad Sets: Strengthening;Both;10 reps;Supine Gluteal Sets: Strengthening;Both;10 reps;Supine Short Arc Quad: Both;10 reps;Supine;AROM (last several were AAROM) Heel Slides: AAROM;Both;10 reps;Supine Hip ABduction/ADduction: AAROM;Both;Supine;10 reps Straight Leg Raises: AAROM;Both;10 reps;Supine    General Comments        Pertinent Vitals/Pain      Home Living                      Prior Function            PT  Goals (current goals can now be found in the care plan section)      Frequency  Min 2X/week    PT Plan Current plan remains appropriate    Co-evaluation             End of Session   Activity Tolerance: Patient tolerated treatment well Patient left: in bed;with call bell/phone within reach;with bed alarm set;with family/visitor present     Time: 7322-0254 PT Time Calculation (min) (ACUTE ONLY): 17 min  Charges:  $Therapeutic Exercise: 8-22 mins                    G Codes:      Charlaine Dalton 01/14/2015, 12:20 PM

## 2015-01-14 NOTE — Progress Notes (Signed)
Subjective:   Hemodialysis yesterday. Tolerated treatment well.  Not working with PT. Not eating well. [employer] at bedside.   Objective:  Vital signs in last 24 hours:  Temp:  [97.6 F (36.4 C)-98.7 F (37.1 C)] 98.2 F (36.8 C) (08/02 0354) Pulse Rate:  [81-90] 88 (08/02 0354) Resp:  [16-33] 18 (08/02 0354) BP: (86-104)/(50-69) 100/57 mmHg (08/02 0354) SpO2:  [99 %-100 %] 99 % (08/02 0354) FiO2 (%):  [2 %] 2 % (08/01 1726) Weight:  [70.3 kg (154 lb 15.7 oz)-71.2 kg (156 lb 15.5 oz)] 70.444 kg (155 lb 4.8 oz) (08/02 0354)  Weight change: 0.8 kg (1 lb 12.2 oz) Filed Weights   01/13/15 1245 01/13/15 1500 01/14/15 0354  Weight: 70.3 kg (154 lb 15.7 oz) 71.2 kg (156 lb 15.5 oz) 70.444 kg (155 lb 4.8 oz)    Intake/Output: I/O last 3 completed shifts: In: -  Out: 830 [Urine:1340]   Intake/Output this shift:  Total I/O In: 121 [P.O.:121] Out: 100 [Urine:100]  Physical Exam: General: chronically ill appearing  Head: oral mucosa moist  Eyes: Anicteric  Neck: Supple, trachea midline  Lungs:  Clear to auscultation normal effort  Heart: S1S2 no rubs  Abdomen:  Soft, BS present  Extremities: No LE edema  Neurologic: Awake, alert, oriented today  Skin: No acute rashes  Access: [facility] permcath  01/10/15 Dr. Lucky Cowboy    Basic Metabolic Panel:  Recent Labs Lab 01/09/15 2505 01/10/15 1333 01/11/15 0552 01/12/15 0437 01/13/15 0427 01/14/15 0427  NA 135 133* 138 140 138 139  K 3.4* 3.2* 2.8* 2.5* 3.3* 3.0*  CL 100* 98* 99* 101 101 101  CO2 22 22 27 26 25 28   GLUCOSE 84 101* 106* 105* 91 98  BUN 64* 68* 32* 35* 41* 30*  CREATININE 7.04* 7.11* 4.00* 4.91* 5.02* 3.93*  CALCIUM 7.8* 7.6*  7.7* 7.5* 7.9* 7.8* 7.8*  MG 1.7  --   --  1.6* 1.9  --   PHOS 2.5 3.2  --   --   --   --     Liver Function Tests:  Recent Labs Lab 01/09/15 0752 01/10/15 1333  ALBUMIN 1.7* 1.6*   No results for input(s): LIPASE, AMYLASE in the last 168 hours. No results for input(s): AMMONIA in  the last 168 hours.  CBC:  Recent Labs Lab 01/10/15 1333 01/11/15 0552 01/11/15 2107 01/12/15 0437 01/13/15 0427 01/14/15 0427  WBC 14.0* 15.5*  --  17.4* 17.5* 14.7*  HGB 6.3* 6.4* 7.9* 7.7* 7.5* 7.1*  HCT 19.1* 19.5* 24.0* 23.3* 22.1* 21.7*  MCV 85.7 87.1  --  87.5 88.2 88.7  PLT 118* 115*  --  108* 87* 80*    Cardiac Enzymes: No results for input(s): CKTOTAL, CKMB, CKMBINDEX, TROPONINI in the last 168 hours.  BNP: Invalid input(s): POCBNP  CBG:  Recent Labs Lab 01/10/15 1713 01/10/15 2038 01/11/15 0158 01/11/15 1155 01/11/15 1622  GLUCAP 74 96 148* 130* 110*    Microbiology: Results for orders placed or performed during the hospital encounter of 11/29/14  MRSA PCR Screening     Status: None   Collection Time: 11/29/14 12:13 PM  Result Value Ref Range Status   MRSA by PCR NEGATIVE NEGATIVE Final    Comment:        The GeneXpert MRSA Assay (FDA approved for NASAL specimens only), is one component of a comprehensive MRSA colonization surveillance program. It is not intended to diagnose MRSA infection nor to guide or monitor treatment for MRSA infections.  Culture, blood (routine x 2)     Status: None   Collection Time: 11/29/14  1:19 PM  Result Value Ref Range Status   Specimen Description BLOOD  Final   Special Requests Immunocompromised  Final   Culture NO GROWTH 5 DAYS  Final   Report Status 12/04/2014 FINAL  Final  Culture, blood (routine x 2)     Status: None   Collection Time: 11/29/14  3:04 PM  Result Value Ref Range Status   Specimen Description Blood  Final   Special Requests Immunocompromised  Final   Report Status 01/03/2015 FINAL  Final  Urine culture     Status: None   Collection Time: 11/30/14 10:12 AM  Result Value Ref Range Status   Specimen Description URINE, CATHETERIZED  Final   Special Requests Immunocompromised  Final   Culture NO GROWTH 2 DAYS  Final   Report Status 12/02/2014 FINAL  Final  Culture, blood (routine x 2)      Status: None   Collection Time: 12/06/14  8:08 PM  Result Value Ref Range Status   Specimen Description BLOOD  Final   Special Requests NONE  Final   Culture  Setup Time   Final    GRAM POSITIVE COCCI IN BOTH AEROBIC AND ANAEROBIC BOTTLES CRITICAL RESULT CALLED TO, READ BACK BY AND VERIFIED WITH: JENNIFER BEZARD AT 2229 12/08/14.PMH CONFIRMED BY RWW    Culture   Final    STAPHYLOCOCCUS AURICULARIS IN BOTH AEROBIC AND ANAEROBIC BOTTLES    Report Status 12/11/2014 FINAL  Final   Organism ID, Bacteria STAPHYLOCOCCUS AURICULARIS  Final      Susceptibility   Staphylococcus auricularis - MIC*    CIPROFLOXACIN >=8 RESISTANT Resistant     ERYTHROMYCIN >=8 RESISTANT Resistant     GENTAMICIN <=0.5 SENSITIVE Sensitive     OXACILLIN >=4 RESISTANT Resistant     TETRACYCLINE <=1 SENSITIVE Sensitive     VANCOMYCIN 1 SENSITIVE Sensitive     CLINDAMYCIN <=0.25 SENSITIVE Sensitive     TRIMETH/SULFA Value in next row Sensitive      SENSITIVE<=20    LEVOFLOXACIN Value in next row Intermediate      INTERMEDIATE4    * STAPHYLOCOCCUS AURICULARIS  Culture, blood (routine x 2)     Status: None   Collection Time: 12/06/14  8:40 PM  Result Value Ref Range Status   Specimen Description BLOOD  Final   Special Requests NONE  Final   Culture NO GROWTH 5 DAYS  Final   Report Status 12/11/2014 FINAL  Final  Culture, blood (routine x 2)     Status: None   Collection Time: 12/08/14 11:40 AM  Result Value Ref Range Status   Specimen Description BLOOD  Final   Special Requests Normal  Final   Culture NO GROWTH 6 DAYS  Final   Report Status 12/14/2014 FINAL  Final  Culture, blood (routine x 2)     Status: None   Collection Time: 12/08/14 11:49 AM  Result Value Ref Range Status   Specimen Description BLOOD  Final   Special Requests Normal  Final   Culture NO GROWTH 6 DAYS  Final   Report Status 12/14/2014 FINAL  Final  C difficile quick scan w PCR reflex ([facility] only)     Status: None   Collection Time:  12/14/14 10:11 AM  Result Value Ref Range Status   C Diff antigen NEGATIVE  Final   C Diff toxin NEGATIVE  Final   C Diff interpretation Negative for  C. difficile  Final  Culture, blood (routine x 2)     Status: None   Collection Time: 12/27/14 11:28 AM  Result Value Ref Range Status   Specimen Description BLOOD  Final   Special Requests Immunocompromised  Final   Culture  Setup Time   Final    YEAST AEROBIC BOTTLE ONLY CRITICAL RESULT CALLED TO, READ BACK BY AND VERIFIED WITH: CATHY SUMMERLIN ON 12/29/14 AT 1000AM BY JEF    Culture CANDIDA PARAPSILOSIS AEROBIC BOTTLE ONLY   Final   Report Status 01/02/2015 FINAL  Final  Culture, blood (routine x 2)     Status: None   Collection Time: 12/27/14 11:36 AM  Result Value Ref Range Status   Specimen Description BLOOD  Final   Special Requests Immunocompromised  Final   Culture  Setup Time   Final    YEAST AEROBIC BOTTLE ONLY CRITICAL RESULT CALLED TO, READ BACK BY AND VERIFIED WITH: LISA ROMERO AT 0111 ON 12/29/14 RWW CONFIRMED BY [place]    Culture CANDIDA PARAPSILOSIS AEROBIC BOTTLE ONLY   Final   Report Status 01/02/2015 FINAL  Final  Cath Tip Culture     Status: None   Collection Time: 12/27/14  4:17 PM  Result Value Ref Range Status   Specimen Description CATH TIP  Final   Special Requests NONE  Final   Culture NO GROWTH 2 DAYS  Final   Report Status 12/30/2014 FINAL  Final  Culture, blood (routine x 2)     Status: None   Collection Time: 12/29/14 12:28 PM  Result Value Ref Range Status   Specimen Description BLOOD RIGHT ARM  Final   Special Requests   Final    BOTTLES DRAWN AEROBIC AND ANAEROBIC  AER 4CC ANA 3CC   Culture  Setup Time   Final    BUDDING YEAST SEEN AEROBIC BOTTLE ONLY CRITICAL RESULT CALLED TO, READ BACK BY AND VERIFIED WITH: BROKE ROBERTSON AT 2230 01/01/15.TSH CONFIRMED BY SDR    Culture CANDIDA PARAPSILOSIS AEROBIC BOTTLE ONLY   Final   Report Status 01/04/2015 FINAL  Final  Culture, blood (routine x  2)     Status: None   Collection Time: 12/29/14 12:28 PM  Result Value Ref Range Status   Specimen Description BLOOD RIGHT FATTY CASTS  Final   Special Requests BOTTLES DRAWN AEROBIC AND ANAEROBIC  5CC  Final   Culture  Setup Time   Final    YEAST AEROBIC BOTTLE ONLY CRITICAL VALUE NOTED.  VALUE IS CONSISTENT WITH PREVIOUSLY REPORTED AND CALLED VALUE.    Culture CANDIDA PARAPSILOSIS  Final   Report Status 01/04/2015 FINAL  Final  Cath Tip Culture     Status: None   Collection Time: 01/02/15 12:42 PM  Result Value Ref Range Status   Specimen Description CATH TIP  Final   Special Requests Normal  Final   Culture NO GROWTH 3 DAYS  Final   Report Status 01/05/2015 FINAL  Final  Culture, blood (routine x 2)     Status: None   Collection Time: 01/02/15  6:34 PM  Result Value Ref Range Status   Specimen Description BLOOD RIGHT HAND  Final   Special Requests 5 BOTTLES DRAWN AEROBIC AND ANAEROBIC 5ML  Final   Culture  Setup Time   Final    BUDDING YEAST SEEN ANAEROBIC BOTTLE ONLY CRITICAL RESULT CALLED TO, READ BACK BY AND VERIFIED WITH: IRIS GUIDRY AT 2030 01/04/15 SDR  CONFIRMED BY RW    Culture CANDIDA PARAPSILOSIS  Final   Report Status 01/07/2015 FINAL  Final  Culture, blood (routine x 2)     Status: None   Collection Time: 01/05/15  8:59 AM  Result Value Ref Range Status   Specimen Description BLOOD RIGHT ASSIST CONTROL  Final   Special Requests BOTTLES DRAWN AEROBIC AND ANAEROBIC 1ML  Final   Culture NO GROWTH 5 DAYS  Final   Report Status 01/10/2015 FINAL  Final  Culture, blood (routine x 2)     Status: None   Collection Time: 01/05/15  9:10 AM  Result Value Ref Range Status   Specimen Description BLOOD LEFT HAND  Final   Special Requests BOTTLES DRAWN AEROBIC AND ANAEROBIC 1ML  Final   Culture NO GROWTH 5 DAYS  Final   Report Status 01/10/2015 FINAL  Final  Cath Tip Culture     Status: None   Collection Time: 01/05/15 11:30 AM  Result Value Ref Range Status   Specimen  Description CATH TIP  Final   Special Requests NONE  Final   Culture PSEUDOMONAS AERUGINOSA KLEBSIELLA OXYTOCA   Final   Report Status 01/08/2015 FINAL  Final   Organism ID, Bacteria PSEUDOMONAS AERUGINOSA  Final   Organism ID, Bacteria KLEBSIELLA OXYTOCA  Final      Susceptibility   Klebsiella oxytoca - MIC*    AMPICILLIN >=32 RESISTANT Resistant     CEFTAZIDIME <=1 SENSITIVE Sensitive     CEFAZOLIN >=64 RESISTANT Resistant     CEFTRIAXONE 8 SENSITIVE Sensitive     CIPROFLOXACIN <=0.25 SENSITIVE Sensitive     GENTAMICIN <=1 SENSITIVE Sensitive     IMIPENEM <=0.25 SENSITIVE Sensitive     TRIMETH/SULFA <=20 SENSITIVE Sensitive     PIP/TAZO Value in next row Resistant      RESISTANT>=128    * KLEBSIELLA OXYTOCA   Pseudomonas aeruginosa - MIC*    CEFTAZIDIME Value in next row Sensitive      RESISTANT>=128    CIPROFLOXACIN Value in next row Resistant      RESISTANT>=128    GENTAMICIN Value in next row Sensitive      RESISTANT>=128    IMIPENEM Value in next row Resistant      RESISTANT>=128    PIP/TAZO Value in next row Sensitive      SENSITIVE8    * PSEUDOMONAS AERUGINOSA    Coagulation Studies: No results for input(s): LABPROT, INR in the last 72 hours.  Urinalysis: No results for input(s): COLORURINE, LABSPEC, PHURINE, GLUCOSEU, HGBUR, BILIRUBINUR, KETONESUR, PROTEINUR, UROBILINOGEN, NITRITE, LEUKOCYTESUR in the last 72 hours.  Invalid input(s): APPERANCEUR    Imaging: US Venous Img Upper Uni Left  01/13/2015   CLINICAL DATA:  Thrombophlebitis.  EXAM: Left UPPER EXTREMITY VENOUS DOPPLER ULTRASOUND  TECHNIQUE: Gray-scale sonography with graded compression, as well as color Doppler and duplex ultrasound were performed to evaluate the upper extremity deep venous system from the level of the subclavian vein and including the jugular, axillary, basilic, radial, ulnar and upper cephalic vein. Spectral Doppler was utilized to evaluate flow at rest and with distal augmentation  maneuvers.  COMPARISON:  01/05/2015.  FINDINGS: Contralateral Subclavian Vein: Respiratory phasicity is normal and symmetric with the symptomatic side. No evidence of thrombus. Normal compressibility.  Internal Jugular Vein: No evidence of thrombus. Normal compressibility, respiratory phasicity and response to augmentation.  Subclavian Vein: No evidence of thrombus. Normal compressibility, respiratory phasicity and response to augmentation.  Axillary Vein: No evidence of thrombus. Normal compressibility, respiratory phasicity and response to augmentation.  Cephalic Vein: Not visualized.  Basilic Vein: Thrombosis again noted throughout the basilic vein.  Brachial Veins: No evidence of thrombus. Normal compressibility, respiratory phasicity and response to augmentation.  Radial Veins: No evidence of thrombus. Normal compressibility, respiratory phasicity and response to augmentation.  Ulnar Veins: No evidence of thrombus. Normal compressibility, respiratory phasicity and response to augmentation.  Venous Reflux:  None visualized.  Other Findings: Soft tissue fullness of the left neck is subclavian region consistent with known adenopathy.  IMPRESSION: 1. Persistent superficial venous thrombosis noted in the basilic vein. 2. No evidence of DVT. 3. Soft tissue fullness in the left neck and subclavian region consistent with known adenopathy.   Electronically Signed   By: [place]   On: 01/13/2015 09:01     Medications:     . amiodarone  200 mg Oral Daily  . antiseptic oral rinse  7 mL Mouth Rinse BID  . apixaban  2.5 mg Oral BID  . carvedilol  20 mg Oral Daily  . dronabinol  2.5 mg Oral BID AC  . epoetin (EPOGEN/PROCRIT) injection  14,000 Units Subcutaneous Q M,W,F-HD  . feeding supplement (NEPRO CARB STEADY)  237 mL Oral BID BM  . fluconazole  200 mg Oral Once per day on Sun Tue Thu Sat  . fluconazole  400 mg Oral Once per day on Mon Wed Fri  . mometasone-formoterol  2 puff Inhalation BID  .  pantoprazole  40 mg Oral BID  . scopolamine  1 patch Transdermal Q72H   sodium chloride, sodium chloride, acetaminophen **OR** acetaminophen, albuterol, haloperidol lactate, heparin, lidocaine (PF), lidocaine-prilocaine, magnesium hydroxide, metoprolol, ondansetron (ZOFRAN) IV, promethazine, ziprasidone  Assessment/ Plan:  Mr. Avina is a 52 black male with progressive mantle cell lymphoma, RICE chemo in 11/2014, hx left sided hydronephrosis and hydroureter. S/p ureteral stent placement   Hospital course complicated by sepsis with Klebsiella oxytoca, hypernatremia. Now with fungemia.   1. End Stage Renal Failure:  Acute renal failure due to ATN and now without recovery.  - Hemodialysis on MWF. Will monitor for dialysis need daily. - Urine output is nonoliguric.   2.  Sepsis with fungemia. A41.9, B49. Status post bacteremia with klebsiella oxytoca: completed two weeks for IV ceftazidime. Subsequently growing candida parapsilosis.  Subcutaneous port removed 01/02/15. - Appreciate ID input.  Currently on fluconazole for 4 weeks. End on 8/21.   3.  Anemia of chronic kidney disease/mantle cell lymphoma: hemoglobin 7.1. May need another transfusion, will hold off until tomorrow on HD treatment.  - status post transfusion 1 unit PRBC on 7/30 and 7/27 - continue epogen 14000 units IV with HD, use cleared by hematology/onc.   4. Secondary Hyperparathyroidism: PTH 126. Phos 2.3.  - not on binders. No indication for activated vitamin D   5. Hypokalemia and hypomagnesemia:  Concerned for poor PO intake. - 4 K bath on HD.    LOS: [place], [place] 8/2/201610:22 AM

## 2015-01-14 NOTE — Plan of Care (Signed)
Problem: Discharge Progression Outcomes Goal: Tolerating diet Outcome: Not Progressing Pt is alert and oriented x 4, denies pain, on room air, vital signs stable, up to chair with maximum assist, small bm throughout shift, using urinal, poor appetite, family at bedside, SR on tele, uneventful shift.

## 2015-01-15 LAB — CBC
HEMATOCRIT: 19.8 % — AB (ref 40.0–52.0)
HEMOGLOBIN: 6.5 g/dL — AB (ref 13.0–18.0)
MCH: 29.5 pg (ref 26.0–34.0)
MCHC: 33.1 g/dL (ref 32.0–36.0)
MCV: 89.2 fL (ref 80.0–100.0)
PLATELETS: 73 10*3/uL — AB (ref 150–440)
RBC: 2.22 MIL/uL — ABNORMAL LOW (ref 4.40–5.90)
RDW: 17.7 % — AB (ref 11.5–14.5)
WBC: 15.5 10*3/uL — ABNORMAL HIGH (ref 3.8–10.6)

## 2015-01-15 LAB — BASIC METABOLIC PANEL
Anion gap: 12 (ref 5–15)
BUN: 34 mg/dL — AB (ref 6–20)
CO2: 26 mmol/L (ref 22–32)
CREATININE: 4.5 mg/dL — AB (ref 0.61–1.24)
Calcium: 7.9 mg/dL — ABNORMAL LOW (ref 8.9–10.3)
Chloride: 100 mmol/L — ABNORMAL LOW (ref 101–111)
GFR calc non Af Amer: 12 mL/min — ABNORMAL LOW (ref >60)
GFR, EST AFRICAN AMERICAN: 14 mL/min — AB (ref >60)
Glucose, Bld: 120 mg/dL — ABNORMAL HIGH (ref 65–99)
POTASSIUM: 2.6 mmol/L — AB (ref 3.5–5.1)
SODIUM: 138 mmol/L (ref 135–145)

## 2015-01-15 LAB — PREPARE RBC (CROSSMATCH)

## 2015-01-15 LAB — MAGNESIUM: MAGNESIUM: 1.8 mg/dL (ref 1.7–2.4)

## 2015-01-15 MED ORDER — POTASSIUM CHLORIDE 10 MEQ/100ML IV SOLN
10.0000 meq | INTRAVENOUS | Status: AC
  Administered 2015-01-15 (×3): 10 meq via INTRAVENOUS
  Filled 2015-01-15 (×6): qty 100

## 2015-01-15 MED ORDER — SODIUM CHLORIDE 0.9 % IV SOLN
Freq: Once | INTRAVENOUS | Status: AC
  Administered 2015-01-15: 16:00:00 via INTRAVENOUS

## 2015-01-15 MED ORDER — POTASSIUM CHLORIDE CRYS ER 20 MEQ PO TBCR
40.0000 meq | EXTENDED_RELEASE_TABLET | Freq: Two times a day (BID) | ORAL | Status: DC
  Administered 2015-01-16 – 2015-01-20 (×10): 40 meq via ORAL
  Filled 2015-01-15 (×10): qty 2

## 2015-01-15 MED ORDER — ALBUMIN HUMAN 25 % IV SOLN
12.5000 g | Freq: Once | INTRAVENOUS | Status: AC
  Administered 2015-01-15: 12.5 g via INTRAVENOUS
  Filled 2015-01-15: qty 50

## 2015-01-15 NOTE — Progress Notes (Signed)
PRE HD   

## 2015-01-15 NOTE — Progress Notes (Signed)
Dr. Bridgett Larsson aware that hgb is 6.5, Verbal conversation.

## 2015-01-15 NOTE — Care Management Important Message (Signed)
Important Message  Patient Details  Name: GRAYCEN SADLON MRN: [id number] Date of Birth: 07-Apr-1946   Medicare Important Message Given:  Yes-fourth notification given    [name] 01/15/2015, 10:08 AM

## 2015-01-15 NOTE — Progress Notes (Signed)
ANTIBIOTIC CONSULT NOTE  Pharmacy Consult for renal dose adjustment of fluconazole Indication: Fungemia - candida parapsilosis   No Known Allergies  Patient Measurements: Height: 6' (182.9 cm) Weight: 148 lb 9.4 oz (67.4 kg) IBW/kg (Calculated) : 77.6  Vital Signs: Temp: 98.2 F (36.8 C) (08/03 1304) Temp Source: Oral (08/03 1230) BP: 92/56 mmHg (08/03 1304) Pulse Rate: 89 (08/03 1230) Intake/Output from previous day: 08/02 0701 - 08/03 0700 In: 221 [P.O.:221] Out: 401 [Urine:401] Intake/Output from this shift: Total I/O In: -  Out: 1000 [Other:1000]  Labs:  Recent Labs  01/13/15 0427 01/14/15 0427 01/15/15 0423  WBC 17.5* 14.7* 15.5*  HGB 7.5* 7.1* 6.5*  PLT 87* 80* 73*  CREATININE 5.02* 3.93* 4.50*   Estimated Creatinine Clearance: 15.2 mL/min (by C-G formula based on Cr of 4.5). No results for input(s): VANCOTROUGH, VANCOPEAK, VANCORANDOM, GENTTROUGH, GENTPEAK, GENTRANDOM, TOBRATROUGH, TOBRAPEAK, TOBRARND, AMIKACINPEAK, AMIKACINTROU, AMIKACIN in the last 72 hours.   Microbiology: Recent Results (from the past 720 hour(s))  Culture, blood (routine x 2)     Status: None   Collection Time: 12/27/14 11:28 AM  Result Value Ref Range Status   Specimen Description BLOOD  Final   Special Requests Immunocompromised  Final   Culture  Setup Time   Final    YEAST AEROBIC BOTTLE ONLY CRITICAL RESULT CALLED TO, READ BACK BY AND VERIFIED WITH: CATHY SUMMERLIN ON 12/29/14 AT 1000AM BY JEF    Culture CANDIDA PARAPSILOSIS AEROBIC BOTTLE ONLY   Final   Report Status 01/02/2015 FINAL  Final  Culture, blood (routine x 2)     Status: None   Collection Time: 12/27/14 11:36 AM  Result Value Ref Range Status   Specimen Description BLOOD  Final   Special Requests Immunocompromised  Final   Culture  Setup Time   Final    YEAST AEROBIC BOTTLE ONLY CRITICAL RESULT CALLED TO, READ BACK BY AND VERIFIED WITH: LISA ROMERO AT 0111 ON 12/29/14 [facility] CONFIRMED BY [place]    Culture CANDIDA  PARAPSILOSIS AEROBIC BOTTLE ONLY   Final   Report Status 01/02/2015 FINAL  Final  Cath Tip Culture     Status: None   Collection Time: 12/27/14  4:17 PM  Result Value Ref Range Status   Specimen Description CATH TIP  Final   Special Requests NONE  Final   Culture NO GROWTH 2 DAYS  Final   Report Status 12/30/2014 FINAL  Final  Culture, blood (routine x 2)     Status: None   Collection Time: 12/29/14 12:28 PM  Result Value Ref Range Status   Specimen Description BLOOD RIGHT ARM  Final   Special Requests   Final    BOTTLES DRAWN AEROBIC AND ANAEROBIC  AER 4CC ANA 3CC   Culture  Setup Time   Final    BUDDING YEAST SEEN AEROBIC BOTTLE ONLY CRITICAL RESULT CALLED TO, READ BACK BY AND VERIFIED WITH: BROKE ROBERTSON AT 2230 01/01/15.[facility]SH CONFIRMED BY [facility]    Culture CANDIDA PARAPSILOSIS AEROBIC BOTTLE ONLY   Final   Report Status 01/04/2015 FINAL  Final  Culture, blood (routine x 2)     Status: None   Collection Time: 12/29/14 12:28 PM  Result Value Ref Range Status   Specimen Description BLOOD RIGHT FATTY CASTS  Final   Special Requests BOTTLES DRAWN AEROBIC AND ANAEROBIC  5CC  Final   Culture  Setup Time   Final    YEAST AEROBIC BOTTLE ONLY CRITICAL VALUE NOTED.  VALUE IS CONSISTENT WITH PREVIOUSLY REPORTED AND  CALLED VALUE.    Culture CANDIDA PARAPSILOSIS  Final   Report Status 01/04/2015 FINAL  Final  Cath Tip Culture     Status: None   Collection Time: 01/02/15 12:42 PM  Result Value Ref Range Status   Specimen Description CATH TIP  Final   Special Requests Normal  Final   Culture NO GROWTH 3 DAYS  Final   Report Status 01/05/2015 FINAL  Final  Culture, blood (routine x 2)     Status: None   Collection Time: 01/02/15  6:34 PM  Result Value Ref Range Status   Specimen Description BLOOD RIGHT HAND  Final   Special Requests 5 BOTTLES DRAWN AEROBIC AND ANAEROBIC 5ML  Final   Culture  Setup Time   Final    BUDDING YEAST SEEN ANAEROBIC BOTTLE ONLY CRITICAL RESULT CALLED TO,  READ BACK BY AND VERIFIED WITH: IRIS GUIDRY AT 2030 01/04/15 [facility]  CONFIRMED BY [facility]W    Culture CANDIDA PARAPSILOSIS  Final   Report Status 01/07/2015 FINAL  Final  Culture, blood (routine x 2)     Status: None   Collection Time: 01/05/15  8:59 AM  Result Value Ref Range Status   Specimen Description BLOOD RIGHT ASSIST CONTROL  Final   Special Requests BOTTLES DRAWN AEROBIC AND ANAEROBIC 1ML  Final   Culture NO GROWTH 5 DAYS  Final   Report Status 01/10/2015 FINAL  Final  Culture, blood (routine x 2)     Status: None   Collection Time: 01/05/15  9:10 AM  Result Value Ref Range Status   Specimen Description BLOOD LEFT HAND  Final   Special Requests BOTTLES DRAWN AEROBIC AND ANAEROBIC 1ML  Final   Culture NO GROWTH 5 DAYS  Final   Report Status 01/10/2015 FINAL  Final  Cath Tip Culture     Status: None   Collection Time: 01/05/15 11:30 AM  Result Value Ref Range Status   Specimen Description CATH TIP  Final   Special Requests NONE  Final   Culture PSEUDOMONAS AERUGINOSA KLEBSIELLA OXYTOCA   Final   Report Status 01/08/2015 FINAL  Final   Organism ID, Bacteria PSEUDOMONAS AERUGINOSA  Final   Organism ID, Bacteria KLEBSIELLA OXYTOCA  Final      Susceptibility   Klebsiella oxytoca - MIC*    AMPICILLIN >=32 RESISTANT Resistant     CEFTAZIDIME <=1 SENSITIVE Sensitive     CEFAZOLIN >=64 RESISTANT Resistant     CEFTRIAXONE 8 SENSITIVE Sensitive     CIPROFLOXACIN <=0.25 SENSITIVE Sensitive     GENTAMICIN <=1 SENSITIVE Sensitive     IMIPENEM <=0.25 SENSITIVE Sensitive     TRIMETH/SULFA <=20 SENSITIVE Sensitive     PIP/TAZO Value in next row Resistant      RESISTANT>=128    * KLEBSIELLA OXYTOCA   Pseudomonas aeruginosa - MIC*    CEFTAZIDIME Value in next row Sensitive      RESISTANT>=128    CIPROFLOXACIN Value in next row Resistant      RESISTANT>=128    GENTAMICIN Value in next row Sensitive      RESISTANT>=128    IMIPENEM Value in next row Resistant      RESISTANT>=128     PIP/TAZO Value in next row Sensitive      SENSITIVE8    * PSEUDOMONAS AERUGINOSA    Medical History: Past Medical History  Diagnosis Date  . Arthritis   . Hypertension   . RA (rheumatoid arthritis)   . Anemia   . Mantle cell lymphoma  Dr Mike Gip  . History of nuclear stress test     a. 12/2013: low risk, no sig ischemia, no EKG changes, no artifact, EF 63%  . Chronic kidney disease   . Sepsis     Dr Ola Spurr    Medications:  Anti-infectives    Start     Dose/Rate Route Frequency Ordered Stop   01/11/15 1800  fluconazole (DIFLUCAN) tablet 200 mg     200 mg Oral Once per day on Sun Tue Thu Sat 01/10/15 1423 02/02/15 2359   01/10/15 1800  fluconazole (DIFLUCAN) tablet 400 mg     400 mg Oral Once per day on Mon Wed Fri 01/10/15 1400 02/02/15 2359   01/10/15 1230  fluconazole (DIFLUCAN) tablet 400 mg  Status:  Discontinued     400 mg Oral Daily 01/10/15 1219 01/10/15 1400   01/02/15 1600  ceFAZolin (ANCEF) IVPB 1 g/50 mL premix     1 g 100 mL/hr over 30 Minutes Intravenous  Once 01/02/15 1550 01/02/15 1621   12/31/14 0000  vancomycin (VANCOCIN) IVPB 750 mg/150 ml premix  Status:  Discontinued     750 mg 150 mL/hr over 60 Minutes Intravenous Once per day on Mon Thu Sat 12/30/14 1126 12/30/14 1131   12/30/14 1200  vancomycin (VANCOCIN) IVPB 750 mg/150 ml premix  Status:  Discontinued     750 mg 150 mL/hr over 60 Minutes Intravenous Once per day on Mon Thu Sat 12/29/14 0903 12/30/14 1126   12/30/14 1129  vancomycin (VANCOCIN) IVPB 750 mg/150 ml premix  Status:  Discontinued    Comments:  Administer dose during last hour of dialysis as long as pre-dialysis random level is less then 20 mcg/mL   750 mg 150 mL/hr over 60 Minutes Intravenous Every Dialysis 12/30/14 1131 12/30/14 1527   12/29/14 1130  micafungin (MYCAMINE) 100 mg in sodium chloride 0.9 % 100 mL IVPB  Status:  Discontinued     100 mg 100 mL/hr over 1 Hours Intravenous Daily 12/29/14 1117 01/10/15 1219   12/28/14  1200  vancomycin (VANCOCIN) IVPB 750 mg/150 ml premix  Status:  Discontinued     750 mg 150 mL/hr over 60 Minutes Intravenous Once per day on Tue Thu Sat 12/27/14 1210 12/29/14 0903   12/27/14 1800  fluconazole (DIFLUCAN) IVPB 200 mg  Status:  Discontinued     200 mg 100 mL/hr over 60 Minutes Intravenous Every 24 hours 12/27/14 1402 12/27/14 1404   12/27/14 1800  fluconazole (DIFLUCAN) IVPB 100 mg  Status:  Discontinued     100 mg 50 mL/hr over 60 Minutes Intravenous Every 24 hours 12/27/14 1404 12/29/14 1117   12/27/14 1400  piperacillin-tazobactam (ZOSYN) IVPB 3.375 g  Status:  Discontinued     3.375 g 12.5 mL/hr over 240 Minutes Intravenous 3 times per day 12/27/14 1049 12/27/14 1210   12/27/14 1300  vancomycin (VANCOCIN) 1,500 mg in sodium chloride 0.9 % 500 mL IVPB     1,500 mg 250 mL/hr over 120 Minutes Intravenous  Once 12/27/14 1210 12/27/14 1521   12/27/14 1230  piperacillin-tazobactam (ZOSYN) IVPB 3.375 g  Status:  Discontinued     3.375 g 12.5 mL/hr over 240 Minutes Intravenous Every 12 hours 12/27/14 1210 12/30/14 1527   12/27/14 1100  vancomycin (VANCOCIN) powder 1,000 mg  Status:  Discontinued     1,000 mg Other To Surgery 12/27/14 1049 12/27/14 1119   12/27/14 1100  vancomycin (VANCOCIN) IVPB 1000 mg/200 mL premix  Status:  Discontinued  1,000 mg 200 mL/hr over 60 Minutes Intravenous Every 24 hours 12/27/14 1049 12/27/14 1210   12/27/14 0000  ceFAZolin (ANCEF) IVPB 1 g/50 mL premix    Comments:  Send with pt to OR   1 g 100 mL/hr over 30 Minutes Intravenous On call 12/26/14 2208 12/28/14 0000   12/19/14 1800  vancomycin (VANCOCIN) IVPB 750 mg/150 ml premix     750 mg 150 mL/hr over 60 Minutes Intravenous  Once 12/19/14 1515 12/19/14 1940   12/15/14 0630  vancomycin (VANCOCIN) IVPB 750 mg/150 ml premix     750 mg 150 mL/hr over 60 Minutes Intravenous  Once 12/15/14 0616 12/15/14 0726   12/14/14 1300  vancomycin (VANCOCIN) IVPB 750 mg/150 ml premix  Status:   Discontinued     750 mg 150 mL/hr over 60 Minutes Intravenous Every 48 hours 12/13/14 1107 12/14/14 0604   12/14/14 0603  vancomycin (VANCOCIN) IVPB 750 mg/150 ml premix  Status:  Discontinued     750 mg 150 mL/hr over 60 Minutes Intravenous Daily PRN 12/14/14 0604 12/21/14 1110   12/09/14 0900  erythromycin 250 mg in sodium chloride 0.9 % 100 mL IVPB  Status:  Discontinued     250 mg 100 mL/hr over 60 Minutes Intravenous 3 times per day 12/09/14 0859 12/13/14 1054   12/08/14 1100  metroNIDAZOLE (FLAGYL) IVPB 500 mg  Status:  Discontinued     500 mg 100 mL/hr over 60 Minutes Intravenous Every 8 hours 12/08/14 0958 12/11/14 1048   12/08/14 0900  vancomycin (VANCOCIN) IVPB 750 mg/150 ml premix  Status:  Discontinued     750 mg 150 mL/hr over 60 Minutes Intravenous Every 24 hours 12/07/14 1102 12/12/14 0955   12/07/14 1030  cefTAZidime (FORTAZ) 2 g in dextrose 5 % 50 mL IVPB  Status:  Discontinued     2 g 100 mL/hr over 30 Minutes Intravenous Every 24 hours 12/07/14 1021 12/11/14 1048   12/07/14 0700  vancomycin (VANCOCIN) IVPB 1000 mg/200 mL premix  Status:  Discontinued     1,000 mg 200 mL/hr over 60 Minutes Intravenous Every 24 hours 12/06/14 2225 12/07/14 1102   12/06/14 2200  piperacillin-tazobactam (ZOSYN) IVPB 4.5 g  Status:  Discontinued     4.5 g 200 mL/hr over 30 Minutes Intravenous 3 times per day 12/06/14 1946 12/07/14 1011   12/06/14 2000  vancomycin (VANCOCIN) IVPB 1000 mg/200 mL premix     1,000 mg 200 mL/hr over 60 Minutes Intravenous STAT 12/06/14 1947 12/06/14 2113   12/06/14 2000  cefTRIAXone (ROCEPHIN) 1 g in dextrose 5 % 50 mL IVPB - Premix  Status:  Discontinued     1 g 100 mL/hr over 30 Minutes Intravenous Every 24 hours 12/06/14 1959 12/07/14 1011   12/06/14 1945  piperacillin-tazobactam (ZOSYN) IVPB 3.375 g  Status:  Discontinued     3.375 g 100 mL/hr over 30 Minutes Intravenous 4 times per day 12/06/14 1940 12/06/14 2007   12/06/14 1945  vancomycin (VANCOCIN)  IVPB 1000 mg/200 mL premix  Status:  Discontinued     1,000 mg 200 mL/hr over 60 Minutes Intravenous Every 12 hours 12/06/14 1940 12/06/14 2008   12/01/14 0945  piperacillin-tazobactam (ZOSYN) IVPB 3.375 g  Status:  Discontinued     3.375 g 12.5 mL/hr over 240 Minutes Intravenous 3 times per day 12/01/14 0936 12/03/14 1337   11/30/14 1000  cefTRIAXone (ROCEPHIN) 2 g in dextrose 5 % 50 mL IVPB - Premix  Status:  Discontinued    Comments:  Entered per MD progress note   2 g 100 mL/hr over 30 Minutes Intravenous Every 24 hours 11/29/14 2157 12/06/14 1940   11/29/14 1415  cefTRIAXone (ROCEPHIN) 1 g in dextrose 5 % 50 mL IVPB - Premix     1 g 100 mL/hr over 30 Minutes Intravenous  Once 11/29/14 1406 11/29/14 1452   11/29/14 1414  cefTRIAXone (ROCEPHIN) 20 MG/ML IVPB 50 mL    Comments:  MONAR, NELLIE: cabinet override      11/29/14 1414 11/29/14 1424     Assessment: Pharmacy consulted to adjust fluconazole dose for renal function in this 69 year old male with ESRD on HD 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  Per ID will plan to treat for 4 week since first negative blood culture on 7/24  Plan:  Current orders for fluconazole 400mg  PO daily. Will adjust for renal function to 400mg  PO QPM on HD days and 200mg  PO QPM on all other days. Doses to be given after dialysis. Scheduled to stop on 8/21, 4 weeks from negative blood culture.  Patient tentatively scheduled for HD on MWF, however nephrology plans to evaluate daily for dialysis need. Will order doses according to current HD schedule (MWF). Pharmacy will need to follow closely and adjust dose schedule if HD schedule changes or extra sessions needed.  Rexene Edison, PharmD Clinical Pharmacist  01/15/2015,1:53 PM

## 2015-01-15 NOTE — Progress Notes (Signed)
Subjective:   Seen and examined on hemodialysis. Albumin given for hypotension. 4K bath for hypokalemia Tolerating treatment well.    Objective:  Vital signs in last 24 hours:  Temp:  [98.1 F (36.7 C)-98.9 F (37.2 C)] 98.2 F (36.8 C) (08/03 0517) Pulse Rate:  [83-97] 84 (08/03 1030) Resp:  [18-30] 30 (08/03 1030) BP: (91-114)/(45-92) 104/57 mmHg (08/03 1030) SpO2:  [94 %-100 %] 94 % (08/03 0517) Weight:  [70.171 kg (154 lb 11.2 oz)] 70.171 kg (154 lb 11.2 oz) (08/03 0517)  Weight change: -0.128 kg (-4.5 oz) Filed Weights   01/13/15 1500 01/14/15 0354 01/15/15 0517  Weight: 71.2 kg (156 lb 15.5 oz) 70.444 kg (155 lb 4.8 oz) 70.171 kg (154 lb 11.2 oz)    Intake/Output: I/O last 3 completed shifts: In: 221 [P.O.:221] Out: 591 [Urine:591]   Intake/Output this shift:     Physical Exam: General: chronically ill appearing  Head: oral mucosa moist  Eyes: Anicteric  Neck: Supple, trachea midline  Lungs:  Clear to auscultation normal effort  Heart: S1S2 no rubs  Abdomen:  Soft, BS present  Extremities: No LE edema  Neurologic: Awake, alert, oriented today  Skin: No acute rashes  Access: LIJ permcath  01/10/15 Dr. Lucky Cowboy    Basic Metabolic Panel:  Recent Labs Lab 01/09/15 4665 01/10/15 1333 01/11/15 9935 01/12/15 0437 01/13/15 0427 01/14/15 0427 01/15/15 0423  NA 135 133* 138 140 138 139 138  K 3.4* 3.2* 2.8* 2.5* 3.3* 3.0* 2.6*  CL 100* 98* 99* 101 101 101 100*  CO2 22 22 27 26 25 28 26   GLUCOSE 84 101* 106* 105* 91 98 120*  BUN 64* 68* 32* 35* 41* 30* 34*  CREATININE 7.04* 7.11* 4.00* 4.91* 5.02* 3.93* 4.50*  CALCIUM 7.8* 7.6*  7.7* 7.5* 7.9* 7.8* 7.8* 7.9*  MG 1.7  --   --  1.6* 1.9  --  1.8  PHOS 2.5 3.2  --   --   --   --   --     Liver Function Tests:  Recent Labs Lab 01/09/15 0752 01/10/15 1333  ALBUMIN 1.7* 1.6*   No results for input(s): LIPASE, AMYLASE in the last 168 hours. No results for input(s): AMMONIA in the last 168  hours.  CBC:  Recent Labs Lab 01/11/15 0552 01/11/15 2107 01/12/15 0437 01/13/15 0427 01/14/15 0427 01/15/15 0423  WBC 15.5*  --  17.4* 17.5* 14.7* 15.5*  HGB 6.4* 7.9* 7.7* 7.5* 7.1* 6.5*  HCT 19.5* 24.0* 23.3* 22.1* 21.7* 19.8*  MCV 87.1  --  87.5 88.2 88.7 89.2  PLT 115*  --  108* 87* 80* 73*    Cardiac Enzymes: No results for input(s): CKTOTAL, CKMB, CKMBINDEX, TROPONINI in the last 168 hours.  BNP: Invalid input(s): POCBNP  CBG:  Recent Labs Lab 01/10/15 1713 01/10/15 2038 01/11/15 0158 01/11/15 1155 01/11/15 1622  GLUCAP 74 96 148* 130* 110*    Microbiology: Results for orders placed or performed during the hospital encounter of 11/29/14  MRSA PCR Screening     Status: None   Collection Time: 11/29/14 12:13 PM  Result Value Ref Range Status   MRSA by PCR NEGATIVE NEGATIVE Final    Comment:        The GeneXpert MRSA Assay (FDA approved for NASAL specimens only), is one component of a comprehensive MRSA colonization surveillance program. It is not intended to diagnose MRSA infection nor to guide or monitor treatment for MRSA infections.   Culture, blood (routine x 2)  Status: None   Collection Time: 11/29/14  1:19 PM  Result Value Ref Range Status   Specimen Description BLOOD  Final   Special Requests Immunocompromised  Final   Culture NO GROWTH 5 DAYS  Final   Report Status 12/04/2014 FINAL  Final  Culture, blood (routine x 2)     Status: None   Collection Time: 11/29/14  3:04 PM  Result Value Ref Range Status   Specimen Description Blood  Final   Special Requests Immunocompromised  Final   Report Status 01/03/2015 FINAL  Final  Urine culture     Status: None   Collection Time: 11/30/14 10:12 AM  Result Value Ref Range Status   Specimen Description URINE, CATHETERIZED  Final   Special Requests Immunocompromised  Final   Culture NO GROWTH 2 DAYS  Final   Report Status 12/02/2014 FINAL  Final  Culture, blood (routine x 2)     Status:  None   Collection Time: 12/06/14  8:08 PM  Result Value Ref Range Status   Specimen Description BLOOD  Final   Special Requests NONE  Final   Culture  Setup Time   Final    GRAM POSITIVE COCCI IN BOTH AEROBIC AND ANAEROBIC BOTTLES CRITICAL RESULT CALLED TO, READ BACK BY AND VERIFIED WITH: JENNIFER BEZARD AT 7106 12/08/14.PMH CONFIRMED BY [facility]    Culture   Final    STAPHYLOCOCCUS AURICULARIS IN BOTH AEROBIC AND ANAEROBIC BOTTLES    Report Status 12/11/2014 FINAL  Final   Organism ID, Bacteria STAPHYLOCOCCUS AURICULARIS  Final      Susceptibility   Staphylococcus auricularis - MIC*    CIPROFLOXACIN >=8 RESISTANT Resistant     ERYTHROMYCIN >=8 RESISTANT Resistant     GENTAMICIN <=0.5 SENSITIVE Sensitive     OXACILLIN >=4 RESISTANT Resistant     TETRACYCLINE <=1 SENSITIVE Sensitive     VANCOMYCIN 1 SENSITIVE Sensitive     CLINDAMYCIN <=0.25 SENSITIVE Sensitive     TRIMETH/SULFA Value in next row Sensitive      SENSITIVE<=20    LEVOFLOXACIN Value in next row Intermediate      INTERMEDIATE4    * STAPHYLOCOCCUS AURICULARIS  Culture, blood (routine x 2)     Status: None   Collection Time: 12/06/14  8:40 PM  Result Value Ref Range Status   Specimen Description BLOOD  Final   Special Requests NONE  Final   Culture NO GROWTH 5 DAYS  Final   Report Status 12/11/2014 FINAL  Final  Culture, blood (routine x 2)     Status: None   Collection Time: 12/08/14 11:40 AM  Result Value Ref Range Status   Specimen Description BLOOD  Final   Special Requests Normal  Final   Culture NO GROWTH 6 DAYS  Final   Report Status 12/14/2014 FINAL  Final  Culture, blood (routine x 2)     Status: None   Collection Time: 12/08/14 11:49 AM  Result Value Ref Range Status   Specimen Description BLOOD  Final   Special Requests Normal  Final   Culture NO GROWTH 6 DAYS  Final   Report Status 12/14/2014 FINAL  Final  C difficile quick scan w PCR reflex ([facility] only)     Status: None   Collection Time: 12/14/14  10:11 AM  Result Value Ref Range Status   C Diff antigen NEGATIVE  Final   C Diff toxin NEGATIVE  Final   C Diff interpretation Negative for C. difficile  Final  Culture, blood (routine x  2)     Status: None   Collection Time: 12/27/14 11:28 AM  Result Value Ref Range Status   Specimen Description BLOOD  Final   Special Requests Immunocompromised  Final   Culture  Setup Time   Final    YEAST AEROBIC BOTTLE ONLY CRITICAL RESULT CALLED TO, READ BACK BY AND VERIFIED WITH: CATHY SUMMERLIN ON 12/29/14 AT 1000AM BY JEF    Culture CANDIDA PARAPSILOSIS AEROBIC BOTTLE ONLY   Final   Report Status 01/02/2015 FINAL  Final  Culture, blood (routine x 2)     Status: None   Collection Time: 12/27/14 11:36 AM  Result Value Ref Range Status   Specimen Description BLOOD  Final   Special Requests Immunocompromised  Final   Culture  Setup Time   Final    YEAST AEROBIC BOTTLE ONLY CRITICAL RESULT CALLED TO, READ BACK BY AND VERIFIED WITH: LISA ROMERO AT 0111 ON 12/29/14 RWW CONFIRMED BY [place]    Culture CANDIDA PARAPSILOSIS AEROBIC BOTTLE ONLY   Final   Report Status 01/02/2015 FINAL  Final  Cath Tip Culture     Status: None   Collection Time: 12/27/14  4:17 PM  Result Value Ref Range Status   Specimen Description CATH TIP  Final   Special Requests NONE  Final   Culture NO GROWTH 2 DAYS  Final   Report Status 12/30/2014 FINAL  Final  Culture, blood (routine x 2)     Status: None   Collection Time: 12/29/14 12:28 PM  Result Value Ref Range Status   Specimen Description BLOOD RIGHT ARM  Final   Special Requests   Final    BOTTLES DRAWN AEROBIC AND ANAEROBIC  AER 4CC ANA 3CC   Culture  Setup Time   Final    BUDDING YEAST SEEN AEROBIC BOTTLE ONLY CRITICAL RESULT CALLED TO, READ BACK BY AND VERIFIED WITH: BROKE ROBERTSON AT 2230 01/01/15.TSH CONFIRMED BY SDR    Culture CANDIDA PARAPSILOSIS AEROBIC BOTTLE ONLY   Final   Report Status 01/04/2015 FINAL  Final  Culture, blood (routine x 2)      Status: None   Collection Time: 12/29/14 12:28 PM  Result Value Ref Range Status   Specimen Description BLOOD RIGHT FATTY CASTS  Final   Special Requests BOTTLES DRAWN AEROBIC AND ANAEROBIC  5CC  Final   Culture  Setup Time   Final    YEAST AEROBIC BOTTLE ONLY CRITICAL VALUE NOTED.  VALUE IS CONSISTENT WITH PREVIOUSLY REPORTED AND CALLED VALUE.    Culture CANDIDA PARAPSILOSIS  Final   Report Status 01/04/2015 FINAL  Final  Cath Tip Culture     Status: None   Collection Time: 01/02/15 12:42 PM  Result Value Ref Range Status   Specimen Description CATH TIP  Final   Special Requests Normal  Final   Culture NO GROWTH 3 DAYS  Final   Report Status 01/05/2015 FINAL  Final  Culture, blood (routine x 2)     Status: None   Collection Time: 01/02/15  6:34 PM  Result Value Ref Range Status   Specimen Description BLOOD RIGHT HAND  Final   Special Requests 5 BOTTLES DRAWN AEROBIC AND ANAEROBIC 5ML  Final   Culture  Setup Time   Final    BUDDING YEAST SEEN ANAEROBIC BOTTLE ONLY CRITICAL RESULT CALLED TO, READ BACK BY AND VERIFIED WITH: IRIS GUIDRY AT 2030 01/04/15 SDR  CONFIRMED BY RW    Culture CANDIDA PARAPSILOSIS  Final   Report Status 01/07/2015 FINAL  Final  Culture, blood (routine x 2)     Status: None   Collection Time: 01/05/15  8:59 AM  Result Value Ref Range Status   Specimen Description BLOOD RIGHT ASSIST CONTROL  Final   Special Requests BOTTLES DRAWN AEROBIC AND ANAEROBIC 1ML  Final   Culture NO GROWTH 5 DAYS  Final   Report Status 01/10/2015 FINAL  Final  Culture, blood (routine x 2)     Status: None   Collection Time: 01/05/15  9:10 AM  Result Value Ref Range Status   Specimen Description BLOOD LEFT HAND  Final   Special Requests BOTTLES DRAWN AEROBIC AND ANAEROBIC 1ML  Final   Culture NO GROWTH 5 DAYS  Final   Report Status 01/10/2015 FINAL  Final  Cath Tip Culture     Status: None   Collection Time: 01/05/15 11:30 AM  Result Value Ref Range Status   Specimen  Description CATH TIP  Final   Special Requests NONE  Final   Culture PSEUDOMONAS AERUGINOSA KLEBSIELLA OXYTOCA   Final   Report Status 01/08/2015 FINAL  Final   Organism ID, Bacteria PSEUDOMONAS AERUGINOSA  Final   Organism ID, Bacteria KLEBSIELLA OXYTOCA  Final      Susceptibility   Klebsiella oxytoca - MIC*    AMPICILLIN >=32 RESISTANT Resistant     CEFTAZIDIME <=1 SENSITIVE Sensitive     CEFAZOLIN >=64 RESISTANT Resistant     CEFTRIAXONE 8 SENSITIVE Sensitive     CIPROFLOXACIN <=0.25 SENSITIVE Sensitive     GENTAMICIN <=1 SENSITIVE Sensitive     IMIPENEM <=0.25 SENSITIVE Sensitive     TRIMETH/SULFA <=20 SENSITIVE Sensitive     PIP/TAZO Value in next row Resistant      RESISTANT>=128    * KLEBSIELLA OXYTOCA   Pseudomonas aeruginosa - MIC*    CEFTAZIDIME Value in next row Sensitive      RESISTANT>=128    CIPROFLOXACIN Value in next row Resistant      RESISTANT>=128    GENTAMICIN Value in next row Sensitive      RESISTANT>=128    IMIPENEM Value in next row Resistant      RESISTANT>=128    PIP/TAZO Value in next row Sensitive      SENSITIVE8    * PSEUDOMONAS AERUGINOSA    Coagulation Studies: No results for input(s): LABPROT, INR in the last 72 hours.  Urinalysis: No results for input(s): COLORURINE, LABSPEC, PHURINE, GLUCOSEU, HGBUR, BILIRUBINUR, KETONESUR, PROTEINUR, UROBILINOGEN, NITRITE, LEUKOCYTESUR in the last 72 hours.  Invalid input(s): APPERANCEUR    Imaging: No results found.   Medications:     . albumin human  12.5 g Intravenous Once  . amiodarone  200 mg Oral Daily  . antiseptic oral rinse  7 mL Mouth Rinse BID  . apixaban  2.5 mg Oral BID  . carvedilol  20 mg Oral Daily  . dronabinol  2.5 mg Oral BID AC  . epoetin (EPOGEN/PROCRIT) injection  14,000 Units Subcutaneous Q M,W,F-HD  . feeding supplement (NEPRO CARB STEADY)  237 mL Oral BID BM  . fluconazole  200 mg Oral Once per day on Sun Tue Thu Sat  . fluconazole  400 mg Oral Once per day on Mon  Wed Fri  . mometasone-formoterol  2 puff Inhalation BID  . pantoprazole  40 mg Oral BID  . [START ON 01/16/2015] potassium chloride  40 mEq Oral BID  . scopolamine  1 patch Transdermal Q72H   sodium chloride, sodium chloride, acetaminophen **OR** acetaminophen, albuterol, haloperidol lactate, heparin, lidocaine (PF),  lidocaine-prilocaine, magnesium hydroxide, metoprolol, ondansetron (ZOFRAN) IV, promethazine, ziprasidone  Assessment/ Plan:  Caleb Francis is a 35 black male with progressive mantle cell lymphoma, RICE chemo in 11/2014, hx left sided hydronephrosis and hydroureter. S/p ureteral stent placement   Hospital course complicated by sepsis with Klebsiella oxytoca, hypernatremia. Now with fungemia.   1. End Stage Renal Failure:  Acute renal failure due to ATN and now without recovery.  - Hemodialysis on MWF. Seen and examined on hemodialysis. Ultrafiltration at 1kg. Iv albumin given.  - Urine output is nonoliguric.  - 4K bath for hypokalemia.   2.  Sepsis with fungemia. A41.9, B49. Status post bacteremia with klebsiella oxytoca: completed two weeks for IV ceftazidime. Subsequently growing candida parapsilosis.  Subcutaneous port removed 01/02/15. - Appreciate ID input.  Currently on fluconazole for 4 weeks. End on 8/21.   3.  Anemia of chronic kidney disease/mantle cell lymphoma: hemoglobin 6.5. May need another transfusion.  - status post transfusion 1 unit PRBC on 7/30 and 7/27 - continue epogen 14000 units IV with HD, use cleared by hematology/onc.   4. Secondary Hyperparathyroidism: PTH 126. Phos 2.3.  - not on binders. No indication for activated vitamin D    LOS: [place], [place] 8/3/201610:44 AM

## 2015-01-15 NOTE — Progress Notes (Signed)
Caleb Francis at [place] NAME: Caleb Francis    MR#:  [id number]  DATE OF BIRTH:  1945-07-18  SUBJECTIVE:  Still poor oral intake. No complaint but still unable to sit up. Got HD today. REVIEW OF SYSTEMS:   Review of Systems  Constitutional: Positive for malaise/fatigue. Negative for fever, chills and weight loss.  HENT: Negative for ear discharge, ear pain and nosebleeds.   Eyes: Negative for blurred vision, pain and discharge.  Respiratory: Negative for sputum production, shortness of breath, wheezing and stridor.   Cardiovascular: Negative for chest pain, palpitations, orthopnea and PND.  Gastrointestinal: Negative for nausea, vomiting, abdominal pain and diarrhea.  Genitourinary: Negative for urgency and frequency.  Musculoskeletal: Negative for back pain and joint pain.  Neurological: Positive for weakness. Negative for sensory change, speech change and focal weakness.  Psychiatric/Behavioral: Negative for depression. The patient is not nervous/anxious.    Tolerating Diet:very little Tolerating PT: eval pending   DRUG ALLERGIES:  No Known Allergies VITALS:  Blood pressure 103/50, pulse 103, temperature 98.1 F (36.7 C), temperature source Oral, resp. rate 18, height 6' (1.829 m), weight 67.4 kg (148 lb 9.4 oz), SpO2 100 %. PHYSICAL EXAMINATION:   Physical Exam GENERAL:  69 y.o.-year-old patient lying in the bed with no acute distress. Appears chronically ill EYES: Pupils equal, round, reactive to light and accommodation. No scleral icterus. Extraocular muscles intact.  HEENT: Head atraumatic, normocephalic. Oropharynx and nasopharynx clear.  NECK:  Supple, no jugular venous distention. No thyroid enlargement, no tenderness.  LUNGS: Normal breath sounds bilaterally, no wheezing, rales, rhonchi. No use of accessory muscles of respiration.  CARDIOVASCULAR: S1, S2 normal. No murmurs, rubs, or gallops.  ABDOMEN: Soft, nontender,  mild distended. Bowel sounds present. No organomegaly or mass.  EXTREMITIES: No cyanosis, clubbing or edema b/l.  NEUROLOGIC: Cranial nerves II through XII are intact. No focal Motor or sensory deficits b/l.  Weak,severe deconditioning PSYCHIATRIC:  patient is alert and oriented x 3.  SKIN: stage II sacral ulcer.  LABORATORY PANEL:   CBC  Recent Labs Lab 01/15/15 0423  WBC 15.5*  HGB 6.5*  HCT 19.8*  PLT 73*    Chemistries   Recent Labs Lab 01/15/15 0423  NA 138  K 2.6*  CL 100*  CO2 26  GLUCOSE 120*  BUN 34*  CREATININE 4.50*  CALCIUM 7.9*  MG 1.8   ASSESSMENT AND PLAN:   #1 sepsis: Fungemia with Candida parapsilosis blood cultures 7/15, and again on repeat culture 7/17 + and 7/21 + Appreciate infectious disease consultation.  continue PO fluconazole 400 mg PO for total 4 weeks from first negative bcx per Dr. Ola Spurr.       . * Leukocytosis. better. Follow-up CBC.  - Port-A-Cath removed on 7/21 by Dr. Lucky Cowboy. Cath tip growing Klebsiella and Pseudomonas (resulted on 7/27) which is likely not significant per ID so not recommending any abx for same unless clinical condition changes and blood c/s turns +  #2 acute renal failure due to ATN,  progressed to ESRD s/p permcath on 7/29, continue HD on M/W/F per Dr. Juleen China.   * Anemia of chronic disease. Hb down 6.5 today, no active bleeding, get 1 unit PRBC transfusion.  s/p total 5 unit of PRBC, f/u Hb in am.  continue epogen 14000 units IV with HD.  #3 Mantle cell lymphoma -recvd Neupogen, status post 2 units packed red blood cells on 6/25 and 1 unit on 7/2 - Prognosis is poor.-followed  by Dr Mike Gip -at present given overall pt's decline and varioius infections not sure if pt is even a candidate for further Chemotherapy - further mgmt per Onco   #4 Chronic ileus with h/o GI bleeding: Clinically improving. No further bleeding. NG tube and rectal tube discontinued 7/12 - Continue PPI, high risk for further  bleeding due to thrombocytopenia - Last seen by GI on July 4. No further plans for endoscopy.  #5 deconditioning: Unable to participate in physical therapy due to poor motivation and severe deconditioning. PT to see if he improves  #6 Severe protein calorie malnutrition: Continue Marinol.  Dr. Manuella Ghazi discussed with family and the patient they would not be interested in PEG tube placement. pt's calorie count now stopped due to severe poor po intake. Encourage intake.  #7 encephalopathy: Appreciate neurology consultation. This is likely encephalopathy/delirium due to critical illness. Agree with when necessary Geodon for agitation. Has not been agitated  improved to baseline.  #8 pressure ulcers: Wound care.  #9 left basilic vein superficial extensive thrombosis On eliquis 2.5 mg bid and repeat US upper extremity shows Persistent superficial venous thrombosis noted in the basilic vein.  * Hypokalemia. K 2.6 today, gave KCL supplement and f/u BMP per Dr. Juleen China.  CODE STATUS: DO NOT RESUSCITATE  Case discussed with Care Management/Social Worker. Management plans discussed with the patient, CSW, Dr. Juleen China.  Physical therapy was not able to sit up for greater than one minute on the side of the bed without max assist. Will be a very challenging case with long term stay and difficult discharge.  PT evaluation suggested skilled nursing facility placement. Per CM, may need out of state placement.  TOTAL TIME TAKING CARE OF THIS PATIENT: 42 minutes.   More than 50% of the time was spent in counseling/coordination of care.   [name] M.D on 01/15/2015 at 3:33 PM  Between 7am to 6pm - Pager - [phone]  After 6pm go to [email or web address] - password [email or web address] [facility]  [facility]  Office  [phone]  CC: Primary care physician; No PCP Per Patient

## 2015-01-15 NOTE — Progress Notes (Signed)
POST HD 

## 2015-01-15 NOTE — Progress Notes (Signed)
HD END 

## 2015-01-15 NOTE — Care Management (Signed)
Spoke with Dr Juleen China and says that it will be a goal to have patient sitting up for his dialysis by Friday.  His intake remains poor.  He did sit up in a chair for approx 1.5 hours within last 24 hours.  Discussed during progression to continue to increase duration of time patient is up in chair.

## 2015-01-15 NOTE — Progress Notes (Signed)
Pt remains alert and oriented, poor appetite, denies pain, bm throughout shift, using urinal, down for dialysis in am, hgb 6.5, 1 unit rbc infusing, pt did not receive potassium as scheduled due to extended time in dialysis and order for blood transfusion when returned from dialysis, poor IV access, reamins in bed, sleeping in between care. Vital signs remains stable, on room air, afebrile throughout shift.

## 2015-01-15 NOTE — Progress Notes (Signed)
HD START 

## 2015-01-16 LAB — BASIC METABOLIC PANEL
Anion gap: 12 (ref 5–15)
BUN: 21 mg/dL — AB (ref 6–20)
CHLORIDE: 99 mmol/L — AB (ref 101–111)
CO2: 27 mmol/L (ref 22–32)
Calcium: 7.8 mg/dL — ABNORMAL LOW (ref 8.9–10.3)
Creatinine, Ser: 3.09 mg/dL — ABNORMAL HIGH (ref 0.61–1.24)
GFR calc non Af Amer: 19 mL/min — ABNORMAL LOW (ref >60)
GFR, EST AFRICAN AMERICAN: 22 mL/min — AB (ref >60)
Glucose, Bld: 113 mg/dL — ABNORMAL HIGH (ref 65–99)
Potassium: 3 mmol/L — ABNORMAL LOW (ref 3.5–5.1)
Sodium: 138 mmol/L (ref 135–145)

## 2015-01-16 LAB — TYPE AND SCREEN
ABO/RH(D): O POS
Antibody Screen: NEGATIVE
Unit division: 0

## 2015-01-16 LAB — CBC
HEMATOCRIT: 23.7 % — AB (ref 40.0–52.0)
Hemoglobin: 7.9 g/dL — ABNORMAL LOW (ref 13.0–18.0)
MCH: 29.7 pg (ref 26.0–34.0)
MCHC: 33.4 g/dL (ref 32.0–36.0)
MCV: 89.1 fL (ref 80.0–100.0)
Platelets: 64 10*3/uL — ABNORMAL LOW (ref 150–440)
RBC: 2.66 MIL/uL — AB (ref 4.40–5.90)
RDW: 16.4 % — ABNORMAL HIGH (ref 11.5–14.5)
WBC: 17.5 10*3/uL — ABNORMAL HIGH (ref 3.8–10.6)

## 2015-01-16 NOTE — Progress Notes (Signed)
Patient had an episode of emesis x2 quickly after eating some breakfast, per nursing assistant. Moderate amount of emesis with food contents. Patient given phenergen and nausea has been relieved. Patient is arousable to voice, but is now very drowsy and would like to wait to take his oral medications. Will follow up.

## 2015-01-16 NOTE — Progress Notes (Signed)
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    PT Comments    Pt lethargic. Agreeable to bed exercises only initially. Pt has difficulty remaining awake during exercises requiring constant cueing and reawakening. Nursing in during treatment and notes pt needs to work on sitting in chair for purposes of tolerating dialysis. With strong encouragement, pt agreeable. Heavy assist required for stand pivot transfer. Continue PT to progress strength, endurance, bed mobility and transfers.   Follow Up Recommendations  SNF     Equipment Recommendations       Recommendations for Other Services       Precautions / Restrictions Precautions Precautions: Fall Restrictions Weight Bearing Restrictions: No    Mobility  Bed Mobility Overal bed mobility: Needs Assistance Bed Mobility: Supine to Sit     Supine to sit: Mod assist;+2 for physical assistance     General bed mobility comments: Assist for trunk and LEs;   Transfers Overall transfer level: Needs assistance Equipment used:  (3 person assist) Transfers: Sit to/from Stand Sit to Stand: Max assist;+2 physical assistance (3rd person for safety) Stand pivot transfers: +2 physical assistance (additional person min A/safety)          Ambulation/Gait                 Stairs            Wheelchair Mobility    Modified Rankin (Stroke Patients Only)       Balance           Standing balance support: During functional activity Standing balance-Leahy Scale: Zero                      Cognition Arousal/Alertness: Lethargic Behavior During Therapy: Flat affect Overall Cognitive Status: Within Functional Limits for tasks assessed                      Exercises General Exercises - Lower Extremity Ankle Circles/Pumps:  AROM;Both;Supine;10 reps Quad Sets: Strengthening;Both;10 reps;Supine Short Arc Quad: Both;10 reps;Supine;AROM;AAROM Heel Slides: AAROM;Both;10 reps;Supine Hip ABduction/ADduction: AAROM;Both;Supine;10 reps Straight Leg Raises: AAROM;Both;10 reps;Supine    General Comments        Pertinent Vitals/Pain Pain Assessment: No/denies pain    Home Living                      Prior Function            PT Goals (current goals can now be found in the care plan section) Progress towards PT goals: Progressing toward goals (slowly)    Frequency  Min 2X/week    PT Plan Current plan remains appropriate    Co-evaluation             End of Session   Activity Tolerance: Patient limited by fatigue (strong encouragement to agree to transfer to chair) Patient left: in chair;with call bell/phone within reach;with chair alarm set;with nursing/sitter in room     Time: 1414-1439 PT Time Calculation (min) (ACUTE ONLY): 25 min  Charges:  $Therapeutic Exercise: 8-22 mins $Therapeutic Activity: 8-22 mins                    G Codes:      Charlaine Dalton 01/16/2015, 2:48 PM

## 2015-01-16 NOTE — Progress Notes (Signed)
Subjective:   Hemodialysis yesterday. Tolerated well. Ultrafiltration 1kg.  1 unit PRBC yesterday.  Nausea and vomiting this morning.    Objective:  Vital signs in last 24 hours:  Temp:  [97.7 F (36.5 C)-99.1 F (37.3 C)] 97.7 F (36.5 C) (08/04 1149) Pulse Rate:  [89-103] 100 (08/04 1149) Resp:  [16-32] 26 (08/04 1149) BP: (90-125)/(49-65) 125/65 mmHg (08/04 1149) SpO2:  [99 %-100 %] 100 % (08/04 1149) Weight:  [67.4 kg (148 lb 9.4 oz)-70.67 kg (155 lb 12.8 oz)] 70.67 kg (155 lb 12.8 oz) (08/04 0329)  Weight change: -0.871 kg (-1 lb 14.7 oz) Filed Weights   01/15/15 0900 01/15/15 1230 01/16/15 0329  Weight: 69.3 kg (152 lb 12.5 oz) 67.4 kg (148 lb 9.4 oz) 70.67 kg (155 lb 12.8 oz)    Intake/Output: I/O last 3 completed shifts: In: 627.2 [P.O.:200; I.V.:24.2; Blood:403] Out: 1650 [Urine:650; Other:1000]   Intake/Output this shift:  Total I/O In: -  Out: 102 [Urine:100; Emesis/NG output:2]  Physical Exam: General: chronically ill appearing  Head: oral mucosa moist  Eyes: Anicteric  Neck: Supple, trachea midline  Lungs:  Clear to auscultation normal effort  Heart: S1S2 no rubs  Abdomen:  Soft, BS present  Extremities: No LE edema  Neurologic: Awake, alert, oriented today  Skin: No acute rashes  Access: [facility] permcath  01/10/15 Dr. Lucky Cowboy    Basic Metabolic Panel:  Recent Labs Lab 01/10/15 1333  01/12/15 2778 01/13/15 0427 01/14/15 0427 01/15/15 0423 01/16/15 0821  NA 133*  < > 140 138 139 138 138  K 3.2*  < > 2.5* 3.3* 3.0* 2.6* 3.0*  CL 98*  < > 101 101 101 100* 99*  CO2 22  < > 26 25 28 26 27   GLUCOSE 101*  < > 105* 91 98 120* 113*  BUN 68*  < > 35* 41* 30* 34* 21*  CREATININE 7.11*  < > 4.91* 5.02* 3.93* 4.50* 3.09*  CALCIUM 7.6*  7.7*  < > 7.9* 7.8* 7.8* 7.9* 7.8*  MG  --   --  1.6* 1.9  --  1.8  --   PHOS 3.2  --   --   --   --   --   --   < > = values in this interval not displayed.  Liver Function Tests:  Recent Labs Lab 01/10/15 1333   ALBUMIN 1.6*   No results for input(s): LIPASE, AMYLASE in the last 168 hours. No results for input(s): AMMONIA in the last 168 hours.  CBC:  Recent Labs Lab 01/12/15 0437 01/13/15 0427 01/14/15 0427 01/15/15 0423 01/16/15 0821  WBC 17.4* 17.5* 14.7* 15.5* 17.5*  HGB 7.7* 7.5* 7.1* 6.5* 7.9*  HCT 23.3* 22.1* 21.7* 19.8* 23.7*  MCV 87.5 88.2 88.7 89.2 89.1  PLT 108* 87* 80* 73* 64*    Cardiac Enzymes: No results for input(s): CKTOTAL, CKMB, CKMBINDEX, TROPONINI in the last 168 hours.  BNP: Invalid input(s): POCBNP  CBG:  Recent Labs Lab 01/10/15 1713 01/10/15 2038 01/11/15 0158 01/11/15 1155 01/11/15 1622  GLUCAP 74 96 148* 130* 110*    Microbiology: Results for orders placed or performed during the hospital encounter of 11/29/14  MRSA PCR Screening     Status: None   Collection Time: 11/29/14 12:13 PM  Result Value Ref Range Status   MRSA by PCR NEGATIVE NEGATIVE Final    Comment:        The GeneXpert MRSA Assay (FDA approved for NASAL specimens only), is one component of a  comprehensive MRSA colonization surveillance program. It is not intended to diagnose MRSA infection nor to guide or monitor treatment for MRSA infections.   Culture, blood (routine x 2)     Status: None   Collection Time: 11/29/14  1:19 PM  Result Value Ref Range Status   Specimen Description BLOOD  Final   Special Requests Immunocompromised  Final   Culture NO GROWTH 5 DAYS  Final   Report Status 12/04/2014 FINAL  Final  Culture, blood (routine x 2)     Status: None   Collection Time: 11/29/14  3:04 PM  Result Value Ref Range Status   Specimen Description Blood  Final   Special Requests Immunocompromised  Final   Report Status 01/03/2015 FINAL  Final  Urine culture     Status: None   Collection Time: 11/30/14 10:12 AM  Result Value Ref Range Status   Specimen Description URINE, CATHETERIZED  Final   Special Requests Immunocompromised  Final   Culture NO GROWTH 2 DAYS   Final   Report Status 12/02/2014 FINAL  Final  Culture, blood (routine x 2)     Status: None   Collection Time: 12/06/14  8:08 PM  Result Value Ref Range Status   Specimen Description BLOOD  Final   Special Requests NONE  Final   Culture  Setup Time   Final    GRAM POSITIVE COCCI IN BOTH AEROBIC AND ANAEROBIC BOTTLES CRITICAL RESULT CALLED TO, READ BACK BY AND VERIFIED WITH: JENNIFER BEZARD AT 2725 12/08/14.PMH CONFIRMED BY RWW    Culture   Final    STAPHYLOCOCCUS AURICULARIS IN BOTH AEROBIC AND ANAEROBIC BOTTLES    Report Status 12/11/2014 FINAL  Final   Organism ID, Bacteria STAPHYLOCOCCUS AURICULARIS  Final      Susceptibility   Staphylococcus auricularis - MIC*    CIPROFLOXACIN >=8 RESISTANT Resistant     ERYTHROMYCIN >=8 RESISTANT Resistant     GENTAMICIN <=0.5 SENSITIVE Sensitive     OXACILLIN >=4 RESISTANT Resistant     TETRACYCLINE <=1 SENSITIVE Sensitive     VANCOMYCIN 1 SENSITIVE Sensitive     CLINDAMYCIN <=0.25 SENSITIVE Sensitive     TRIMETH/SULFA Value in next row Sensitive      SENSITIVE<=20    LEVOFLOXACIN Value in next row Intermediate      INTERMEDIATE4    * STAPHYLOCOCCUS AURICULARIS  Culture, blood (routine x 2)     Status: None   Collection Time: 12/06/14  8:40 PM  Result Value Ref Range Status   Specimen Description BLOOD  Final   Special Requests NONE  Final   Culture NO GROWTH 5 DAYS  Final   Report Status 12/11/2014 FINAL  Final  Culture, blood (routine x 2)     Status: None   Collection Time: 12/08/14 11:40 AM  Result Value Ref Range Status   Specimen Description BLOOD  Final   Special Requests Normal  Final   Culture NO GROWTH 6 DAYS  Final   Report Status 12/14/2014 FINAL  Final  Culture, blood (routine x 2)     Status: None   Collection Time: 12/08/14 11:49 AM  Result Value Ref Range Status   Specimen Description BLOOD  Final   Special Requests Normal  Final   Culture NO GROWTH 6 DAYS  Final   Report Status 12/14/2014 FINAL  Final  C  difficile quick scan w PCR reflex ([facility] only)     Status: None   Collection Time: 12/14/14 10:11 AM  Result Value Ref Range  Status   C Diff antigen NEGATIVE  Final   C Diff toxin NEGATIVE  Final   C Diff interpretation Negative for C. difficile  Final  Culture, blood (routine x 2)     Status: None   Collection Time: 12/27/14 11:28 AM  Result Value Ref Range Status   Specimen Description BLOOD  Final   Special Requests Immunocompromised  Final   Culture  Setup Time   Final    YEAST AEROBIC BOTTLE ONLY CRITICAL RESULT CALLED TO, READ BACK BY AND VERIFIED WITH: CATHY SUMMERLIN ON 12/29/14 AT 1000AM BY JEF    Culture CANDIDA PARAPSILOSIS AEROBIC BOTTLE ONLY   Final   Report Status 01/02/2015 FINAL  Final  Culture, blood (routine x 2)     Status: None   Collection Time: 12/27/14 11:36 AM  Result Value Ref Range Status   Specimen Description BLOOD  Final   Special Requests Immunocompromised  Final   Culture  Setup Time   Final    YEAST AEROBIC BOTTLE ONLY CRITICAL RESULT CALLED TO, READ BACK BY AND VERIFIED WITH: LISA ROMERO AT 0111 ON 12/29/14 RWW CONFIRMED BY [place]    Culture CANDIDA PARAPSILOSIS AEROBIC BOTTLE ONLY   Final   Report Status 01/02/2015 FINAL  Final  Cath Tip Culture     Status: None   Collection Time: 12/27/14  4:17 PM  Result Value Ref Range Status   Specimen Description CATH TIP  Final   Special Requests NONE  Final   Culture NO GROWTH 2 DAYS  Final   Report Status 12/30/2014 FINAL  Final  Culture, blood (routine x 2)     Status: None   Collection Time: 12/29/14 12:28 PM  Result Value Ref Range Status   Specimen Description BLOOD RIGHT ARM  Final   Special Requests   Final    BOTTLES DRAWN AEROBIC AND ANAEROBIC  AER 4CC ANA 3CC   Culture  Setup Time   Final    BUDDING YEAST SEEN AEROBIC BOTTLE ONLY CRITICAL RESULT CALLED TO, READ BACK BY AND VERIFIED WITH: BROKE ROBERTSON AT 2230 01/01/15.TSH CONFIRMED BY SDR    Culture CANDIDA PARAPSILOSIS AEROBIC BOTTLE  ONLY   Final   Report Status 01/04/2015 FINAL  Final  Culture, blood (routine x 2)     Status: None   Collection Time: 12/29/14 12:28 PM  Result Value Ref Range Status   Specimen Description BLOOD RIGHT FATTY CASTS  Final   Special Requests BOTTLES DRAWN AEROBIC AND ANAEROBIC  5CC  Final   Culture  Setup Time   Final    YEAST AEROBIC BOTTLE ONLY CRITICAL VALUE NOTED.  VALUE IS CONSISTENT WITH PREVIOUSLY REPORTED AND CALLED VALUE.    Culture CANDIDA PARAPSILOSIS  Final   Report Status 01/04/2015 FINAL  Final  Cath Tip Culture     Status: None   Collection Time: 01/02/15 12:42 PM  Result Value Ref Range Status   Specimen Description CATH TIP  Final   Special Requests Normal  Final   Culture NO GROWTH 3 DAYS  Final   Report Status 01/05/2015 FINAL  Final  Culture, blood (routine x 2)     Status: None   Collection Time: 01/02/15  6:34 PM  Result Value Ref Range Status   Specimen Description BLOOD RIGHT HAND  Final   Special Requests 5 BOTTLES DRAWN AEROBIC AND ANAEROBIC 5ML  Final   Culture  Setup Time   Final    BUDDING YEAST SEEN ANAEROBIC BOTTLE ONLY CRITICAL RESULT CALLED  TO, READ BACK BY AND VERIFIED WITH: IRIS GUIDRY AT 2030 01/04/15 SDR  CONFIRMED BY RW    Culture CANDIDA PARAPSILOSIS  Final   Report Status 01/07/2015 FINAL  Final  Culture, blood (routine x 2)     Status: None   Collection Time: 01/05/15  8:59 AM  Result Value Ref Range Status   Specimen Description BLOOD RIGHT ASSIST CONTROL  Final   Special Requests BOTTLES DRAWN AEROBIC AND ANAEROBIC 1ML  Final   Culture NO GROWTH 5 DAYS  Final   Report Status 01/10/2015 FINAL  Final  Culture, blood (routine x 2)     Status: None   Collection Time: 01/05/15  9:10 AM  Result Value Ref Range Status   Specimen Description BLOOD LEFT HAND  Final   Special Requests BOTTLES DRAWN AEROBIC AND ANAEROBIC 1ML  Final   Culture NO GROWTH 5 DAYS  Final   Report Status 01/10/2015 FINAL  Final  Cath Tip Culture     Status: None    Collection Time: 01/05/15 11:30 AM  Result Value Ref Range Status   Specimen Description CATH TIP  Final   Special Requests NONE  Final   Culture PSEUDOMONAS AERUGINOSA KLEBSIELLA OXYTOCA   Final   Report Status 01/08/2015 FINAL  Final   Organism ID, Bacteria PSEUDOMONAS AERUGINOSA  Final   Organism ID, Bacteria KLEBSIELLA OXYTOCA  Final      Susceptibility   Klebsiella oxytoca - MIC*    AMPICILLIN >=32 RESISTANT Resistant     CEFTAZIDIME <=1 SENSITIVE Sensitive     CEFAZOLIN >=64 RESISTANT Resistant     CEFTRIAXONE 8 SENSITIVE Sensitive     CIPROFLOXACIN <=0.25 SENSITIVE Sensitive     GENTAMICIN <=1 SENSITIVE Sensitive     IMIPENEM <=0.25 SENSITIVE Sensitive     TRIMETH/SULFA <=20 SENSITIVE Sensitive     PIP/TAZO Value in next row Resistant      RESISTANT>=128    * KLEBSIELLA OXYTOCA   Pseudomonas aeruginosa - MIC*    CEFTAZIDIME Value in next row Sensitive      RESISTANT>=128    CIPROFLOXACIN Value in next row Resistant      RESISTANT>=128    GENTAMICIN Value in next row Sensitive      RESISTANT>=128    IMIPENEM Value in next row Resistant      RESISTANT>=128    PIP/TAZO Value in next row Sensitive      SENSITIVE8    * PSEUDOMONAS AERUGINOSA    Coagulation Studies: No results for input(s): LABPROT, INR in the last 72 hours.  Urinalysis: No results for input(s): COLORURINE, LABSPEC, PHURINE, GLUCOSEU, HGBUR, BILIRUBINUR, KETONESUR, PROTEINUR, UROBILINOGEN, NITRITE, LEUKOCYTESUR in the last 72 hours.  Invalid input(s): APPERANCEUR    Imaging: No results found.   Medications:     . amiodarone  200 mg Oral Daily  . antiseptic oral rinse  7 mL Mouth Rinse BID  . apixaban  2.5 mg Oral BID  . carvedilol  20 mg Oral Daily  . dronabinol  2.5 mg Oral BID AC  . epoetin (EPOGEN/PROCRIT) injection  14,000 Units Subcutaneous Q M,W,F-HD  . feeding supplement (NEPRO CARB STEADY)  237 mL Oral BID BM  . fluconazole  200 mg Oral Once per day on Sun Tue Thu Sat  .  fluconazole  400 mg Oral Once per day on Mon Wed Fri  . mometasone-formoterol  2 puff Inhalation BID  . pantoprazole  40 mg Oral BID  . potassium chloride  40 mEq Oral BID  .  scopolamine  1 patch Transdermal Q72H   sodium chloride, sodium chloride, acetaminophen **OR** acetaminophen, albuterol, haloperidol lactate, heparin, lidocaine (PF), lidocaine-prilocaine, magnesium hydroxide, metoprolol, ondansetron (ZOFRAN) IV, promethazine, ziprasidone  Assessment/ Plan:  Caleb Francis is a 72 black male with progressive mantle cell lymphoma, RICE chemo in 11/2014, hx left sided hydronephrosis and hydroureter. S/p ureteral stent placement   Hospital course complicated by sepsis with Klebsiella oxytoca, hypernatremia. Now with fungemia.   1. End Stage Renal Failure:  Acute renal failure due to ATN and now without recovery.  - Hemodialysis on MWF. Hemodialysis for tomorrow. Minimal UF. - IV albumin given.  - Urine output is nonoliguric.  - 4K bath for hypokalemia.   2.  Sepsis with fungemia. A41.9, B49. Status post bacteremia with klebsiella oxytoca: completed two weeks for IV ceftazidime. Subsequently growing candida parapsilosis.  Subcutaneous port removed 01/02/15. - Appreciate ID input.  Currently on fluconazole for 4 weeks. End on 8/21.   3.  Anemia of chronic kidney disease/mantle cell lymphoma: hemoglobin 7.9. May need another transfusion.  - status post transfusion 1 unit PRBC on 7/27, 7/30, 8*3 - continue epogen 14000 units IV with HD, use cleared by hematology/onc.   4. Secondary Hyperparathyroidism: PTH 126. Phos 2.3.  - not on binders. No indication for activated vitamin D    LOS: [place], [place] 8/4/201612:08 PM

## 2015-01-16 NOTE — Care Management (Signed)
Spoke with attending and nephrology.  Patient has not been able to sit up today.  He required IV phenergan for vomiting this morning.  Now he is too drowsy to sit up but primary nurse is going to try.  Discussed duration of dialysis with nephrology.  Discussed four sessions weekly with shorter duration instead of three treatments a week with longer duration with nephorlogy.  MD stated that  would be able to shorten sessions to 3 hour sessions three times weekly.  Discussed BUN and Creatines and nephrology informed CM that the decline in the levels is due to loss of body mass rather than an "improvement" due to dialysis. Disccused receiving dialysis in the recliner.  CM is reaching out to the family to discuss goal of chair dialysis.  Discussed revisiting LT[facility] authorization during long stay.

## 2015-01-16 NOTE — Progress Notes (Signed)
[place] [facility] [place] NAME: Caleb Francis    MR#:  [id number]  DATE OF BIRTH:  01-20-1946  SUBJECTIVE:  Still poor oral intake. still unable to sit up. Vomited once this am.  REVIEW OF SYSTEMS:   Review of Systems  Constitutional: Positive for malaise/fatigue. Negative for fever, chills and weight loss.  HENT: Negative for ear discharge, ear pain and nosebleeds.   Eyes: Negative for blurred vision, pain and discharge.  Respiratory: Negative for sputum production, shortness of breath, wheezing and stridor.   Cardiovascular: Negative for chest pain, palpitations, orthopnea and PND.  Gastrointestinal: Positive for vomiting. Negative for nausea, abdominal pain and diarrhea.  Genitourinary: Negative for urgency and frequency.  Musculoskeletal: Negative for back pain and joint pain.  Neurological: Positive for weakness. Negative for sensory change, speech change and focal weakness.  Psychiatric/Behavioral: Negative for depression. The patient is not nervous/anxious.    Tolerating Diet:very little Tolerating PT: eval pending   DRUG ALLERGIES:  No Known Allergies VITALS:  Blood pressure 125/65, pulse 100, temperature 97.7 F (36.5 C), temperature source Oral, resp. rate 26, height 6' (1.829 m), weight 70.67 kg (155 lb 12.8 oz), SpO2 100 %. PHYSICAL EXAMINATION:   Physical Exam GENERAL:  69 y.o.-year-old patient lying in the bed with no acute distress. Appears chronically ill EYES: Pupils equal, round, reactive to light and accommodation. No scleral icterus. Extraocular muscles intact.  HEENT: Head atraumatic, normocephalic. Oropharynx and nasopharynx clear.  NECK:  Supple, no jugular venous distention. No thyroid enlargement, no tenderness.  LUNGS: Normal breath sounds bilaterally, no wheezing, rales, rhonchi. No use of accessory muscles of respiration.  CARDIOVASCULAR: S1, S2 normal. No murmurs, rubs, or gallops.  ABDOMEN: Soft,  nontender, mild distended. Bowel sounds present. No organomegaly or mass.  EXTREMITIES: No cyanosis, clubbing or edema b/l.  NEUROLOGIC: Cranial nerves II through XII are intact. No focal Motor or sensory deficits b/l.  Weak,severe deconditioning PSYCHIATRIC:  patient is alert and oriented x 3.  SKIN: stage II sacral ulcer.  LABORATORY PANEL:   CBC  Recent Labs Lab 01/16/15 0821  WBC 17.5*  HGB 7.9*  HCT 23.7*  PLT 64*    Chemistries   Recent Labs Lab 01/15/15 0423 01/16/15 0821  NA 138 138  K 2.6* 3.0*  CL 100* 99*  CO2 26 27  GLUCOSE 120* 113*  BUN 34* 21*  CREATININE 4.50* 3.09*  CALCIUM 7.9* 7.8*  MG 1.8  --    ASSESSMENT AND PLAN:   #1 sepsis: Fungemia with Candida parapsilosis blood cultures 7/15, and again on repeat culture 7/17 + and 7/21 + Appreciate infectious disease consultation.  continue PO fluconazole 400 mg PO for total 4 weeks from first negative bcx per Dr. Ola Spurr.       . * Leukocytosis. Worse, follow-up CBC.  - Port-A-Cath removed on 7/21 by Dr. Lucky Cowboy. Cath tip growing Klebsiella and Pseudomonas (resulted on 7/27) which is likely not significant per ID so not recommending any abx for same unless clinical condition changes and blood c/s turns +  #2 acute renal failure due to ATN,  progressed to ESRD s/p permcath on 7/29, continue HD on M/W/F per Dr. Juleen China.   * Anemia of chronic disease. Hb down 6.5 and up to 7.9 today after 1 unit PRBC transfusion.  no active bleeding,  s/p total 6 unit of PRBC, f/u Hb in am.  continue epogen 14000 units IV with HD.  #3 Mantle cell lymphoma -recvd Neupogen,  Prognosis is poor.-followed by Dr Mike Gip -at present given overall pt's decline and varioius infections not sure if pt is even a candidate for further Chemotherapy - further mgmt per Onco   #4 Chronic ileus with h/o GI bleeding: Clinically improving. No further bleeding. NG tube and rectal tube discontinued 7/12 - Continue PPI, high risk for  further bleeding due to thrombocytopenia - Last seen by GI on July 4. No further plans for endoscopy.  #5 deconditioning: Unable to participate in physical therapy due to poor motivation and severe deconditioning. PT to see if he improves  #6 Severe protein calorie malnutrition: Continue Marinol.  Dr. Manuella Ghazi discussed with family and the patient they would not be interested in PEG tube placement. pt's calorie count now stopped due to severe poor po intake. Encourage intake.  #7 encephalopathy: Appreciate neurology consultation. This is likely encephalopathy/delirium due to critical illness. Agree with when necessary Geodon for agitation. Has not been agitated  improved to baseline.  #8 pressure ulcers: Wound care.  #9 left basilic vein superficial extensive thrombosis On eliquis 2.5 mg bid and repeat US upper extremity shows Persistent superficial venous thrombosis noted in the basilic vein.  * Hypokalemia. K 3.0 today, gave KCL supplement and f/u BMP per Dr. Juleen China.  CODE STATUS: DO NOT RESUSCITATE  Case discussed with Care Management/Social Worker. Management plans discussed with the patient, CSW, Dr. Juleen China.  Physical therapy was not able to sit up for greater than one minute on the side of the bed without max assist. Will be a very challenging case with long term stay and difficult discharge.  PT evaluation suggested skilled nursing facility placement. Per CM, may need out of state placement.  TOTAL TIME TAKING CARE OF THIS PATIENT: 37 minutes.   More than 50% of the time was spent in counseling/coordination of care.   [name] M.D on 01/16/2015 at 2:10 PM  Between 7am to 6pm - Pager - [phone]  After 6pm go to [email or web address] - password [email or web address] [facility]  [facility]  Office  [phone]  CC: Primary care physician; No PCP Per Patient

## 2015-01-17 LAB — RENAL FUNCTION PANEL
Albumin: 1.9 g/dL — ABNORMAL LOW (ref 3.5–5.0)
Anion gap: 12 (ref 5–15)
BUN: 28 mg/dL — ABNORMAL HIGH (ref 6–20)
CO2: 26 mmol/L (ref 22–32)
Calcium: 7.8 mg/dL — ABNORMAL LOW (ref 8.9–10.3)
Chloride: 98 mmol/L — ABNORMAL LOW (ref 101–111)
Creatinine, Ser: 3.9 mg/dL — ABNORMAL HIGH (ref 0.61–1.24)
GFR calc Af Amer: 17 mL/min — ABNORMAL LOW
GFR calc non Af Amer: 15 mL/min — ABNORMAL LOW
Glucose, Bld: 90 mg/dL (ref 65–99)
Phosphorus: 1.4 mg/dL — ABNORMAL LOW (ref 2.5–4.6)
Potassium: 3.5 mmol/L (ref 3.5–5.1)
Sodium: 136 mmol/L (ref 135–145)

## 2015-01-17 LAB — CBC
HCT: 23.3 % — ABNORMAL LOW (ref 40.0–52.0)
HEMOGLOBIN: 7.6 g/dL — AB (ref 13.0–18.0)
MCH: 29.2 pg (ref 26.0–34.0)
MCHC: 32.5 g/dL (ref 32.0–36.0)
MCV: 89.9 fL (ref 80.0–100.0)
PLATELETS: 55 10*3/uL — AB (ref 150–440)
RBC: 2.59 MIL/uL — AB (ref 4.40–5.90)
RDW: 17.2 % — AB (ref 11.5–14.5)
WBC: 16.6 10*3/uL — ABNORMAL HIGH (ref 3.8–10.6)

## 2015-01-17 MED ORDER — ALBUMIN HUMAN 25 % IV SOLN
25.0000 g | Freq: Once | INTRAVENOUS | Status: AC
  Administered 2015-01-17: 25 g via INTRAVENOUS
  Filled 2015-01-17: qty 100

## 2015-01-17 MED ORDER — TUBERCULIN PPD 5 UNIT/0.1ML ID SOLN
5.0000 [IU] | Freq: Once | INTRADERMAL | Status: AC
  Administered 2015-01-17: 5 [IU] via INTRADERMAL
  Filled 2015-01-17: qty 0.1

## 2015-01-17 NOTE — Progress Notes (Signed)
PRE HD   

## 2015-01-17 NOTE — Progress Notes (Signed)
PT Cancellation Note  Patient Details Name: Caleb Francis MRN: [id number] DOB: [birth year]   Cancelled Treatment:    Reason Eval/Treat Not Completed: Patient declined, no reason specified Pt recently returned from HD states too tired to participate   [name] 01/17/2015, 2:21 PM  Pharrell Ledford, PTA

## 2015-01-17 NOTE — Progress Notes (Signed)
Report was given to [facility] in 1C. HD was updated on pt change of rooms. Pt was ok in HD. Pt has no further concerns at this time.

## 2015-01-17 NOTE — Progress Notes (Signed)
Subjective:   Seated in chair. Hemodialysis treatment. Tolerating HD treatment and chair sitting well at this time.  UF minimal. IV albumin for hypotension given.    Objective:  Vital signs in last 24 hours:  Temp:  [97.4 F (36.3 C)-98.1 F (36.7 C)] 98.1 F (36.7 C) (08/05 0446) Pulse Rate:  [86-100] 86 (08/05 0733) Resp:  [20-26] 20 (08/05 0733) BP: (88-125)/(51-65) 103/61 mmHg (08/05 0733) SpO2:  [96 %-100 %] 100 % (08/05 0733) Weight:  [68.04 kg (150 lb)] 68.04 kg (150 lb) (08/05 0446)  Weight change: -1.26 kg (-2 lb 12.5 oz) Filed Weights   01/15/15 1230 01/16/15 0329 01/17/15 0446  Weight: 67.4 kg (148 lb 9.4 oz) 70.67 kg (155 lb 12.8 oz) 68.04 kg (150 lb)    Intake/Output: I/O last 3 completed shifts: In: 270 [P.O.:270] Out: 777 [Urine:775; Emesis/NG output:2]   Intake/Output this shift:     Physical Exam: General: chronically ill appearing  Head: oral mucosa moist  Eyes: Anicteric  Neck: Supple, trachea midline  Lungs:  Clear to auscultation normal effort  Heart: S1S2 no rubs  Abdomen:  Soft, BS present  Extremities: No LE edema  Neurologic: Awake, alert, oriented today  Skin: No acute rashes  Access: [facility] permcath  01/10/15 Dr. Lucky Cowboy    Basic Metabolic Panel:  Recent Labs Lab 01/10/15 1333  01/12/15 7035 01/13/15 0427 01/14/15 0427 01/15/15 0423 01/16/15 0821  NA 133*  < > 140 138 139 138 138  K 3.2*  < > 2.5* 3.3* 3.0* 2.6* 3.0*  CL 98*  < > 101 101 101 100* 99*  CO2 22  < > 26 25 28 26 27   GLUCOSE 101*  < > 105* 91 98 120* 113*  BUN 68*  < > 35* 41* 30* 34* 21*  CREATININE 7.11*  < > 4.91* 5.02* 3.93* 4.50* 3.09*  CALCIUM 7.6*  7.7*  < > 7.9* 7.8* 7.8* 7.9* 7.8*  MG  --   --  1.6* 1.9  --  1.8  --   PHOS 3.2  --   --   --   --   --   --   < > = values in this interval not displayed.  Liver Function Tests:  Recent Labs Lab 01/10/15 1333  ALBUMIN 1.6*   No results for input(s): LIPASE, AMYLASE in the last 168 hours. No results for  input(s): AMMONIA in the last 168 hours.  CBC:  Recent Labs Lab 01/12/15 0437 01/13/15 0427 01/14/15 0427 01/15/15 0423 01/16/15 0821  WBC 17.4* 17.5* 14.7* 15.5* 17.5*  HGB 7.7* 7.5* 7.1* 6.5* 7.9*  HCT 23.3* 22.1* 21.7* 19.8* 23.7*  MCV 87.5 88.2 88.7 89.2 89.1  PLT 108* 87* 80* 73* 64*    Cardiac Enzymes: No results for input(s): CKTOTAL, CKMB, CKMBINDEX, TROPONINI in the last 168 hours.  BNP: Invalid input(s): POCBNP  CBG:  Recent Labs Lab 01/10/15 1713 01/10/15 2038 01/11/15 0158 01/11/15 1155 01/11/15 1622  GLUCAP 74 96 148* 130* 110*    Microbiology: Results for orders placed or performed during the hospital encounter of 11/29/14  MRSA PCR Screening     Status: None   Collection Time: 11/29/14 12:13 PM  Result Value Ref Range Status   MRSA by PCR NEGATIVE NEGATIVE Final    Comment:        The GeneXpert MRSA Assay (FDA approved for NASAL specimens only), is one component of a comprehensive MRSA colonization surveillance program. It is not intended to diagnose MRSA infection nor  to guide or monitor treatment for MRSA infections.   Culture, blood (routine x 2)     Status: None   Collection Time: 11/29/14  1:19 PM  Result Value Ref Range Status   Specimen Description BLOOD  Final   Special Requests Immunocompromised  Final   Culture NO GROWTH 5 DAYS  Final   Report Status 12/04/2014 FINAL  Final  Culture, blood (routine x 2)     Status: None   Collection Time: 11/29/14  3:04 PM  Result Value Ref Range Status   Specimen Description Blood  Final   Special Requests Immunocompromised  Final   Report Status 01/03/2015 FINAL  Final  Urine culture     Status: None   Collection Time: 11/30/14 10:12 AM  Result Value Ref Range Status   Specimen Description URINE, CATHETERIZED  Final   Special Requests Immunocompromised  Final   Culture NO GROWTH 2 DAYS  Final   Report Status 12/02/2014 FINAL  Final  Culture, blood (routine x 2)     Status: None    Collection Time: 12/06/14  8:08 PM  Result Value Ref Range Status   Specimen Description BLOOD  Final   Special Requests NONE  Final   Culture  Setup Time   Final    GRAM POSITIVE COCCI IN BOTH AEROBIC AND ANAEROBIC BOTTLES CRITICAL RESULT CALLED TO, READ BACK BY AND VERIFIED WITH: JENNIFER BEZARD AT 1443 12/08/14.PMH CONFIRMED BY [facility]    Culture   Final    STAPHYLOCOCCUS AURICULARIS IN BOTH AEROBIC AND ANAEROBIC BOTTLES    Report Status 12/11/2014 FINAL  Final   Organism ID, Bacteria STAPHYLOCOCCUS AURICULARIS  Final      Susceptibility   Staphylococcus auricularis - MIC*    CIPROFLOXACIN >=8 RESISTANT Resistant     ERYTHROMYCIN >=8 RESISTANT Resistant     GENTAMICIN <=0.5 SENSITIVE Sensitive     OXACILLIN >=4 RESISTANT Resistant     TETRACYCLINE <=1 SENSITIVE Sensitive     VANCOMYCIN 1 SENSITIVE Sensitive     CLINDAMYCIN <=0.25 SENSITIVE Sensitive     TRIMETH/SULFA Value in next row Sensitive      SENSITIVE<=20    LEVOFLOXACIN Value in next row Intermediate      INTERMEDIATE4    * STAPHYLOCOCCUS AURICULARIS  Culture, blood (routine x 2)     Status: None   Collection Time: 12/06/14  8:40 PM  Result Value Ref Range Status   Specimen Description BLOOD  Final   Special Requests NONE  Final   Culture NO GROWTH 5 DAYS  Final   Report Status 12/11/2014 FINAL  Final  Culture, blood (routine x 2)     Status: None   Collection Time: 12/08/14 11:40 AM  Result Value Ref Range Status   Specimen Description BLOOD  Final   Special Requests Normal  Final   Culture NO GROWTH 6 DAYS  Final   Report Status 12/14/2014 FINAL  Final  Culture, blood (routine x 2)     Status: None   Collection Time: 12/08/14 11:49 AM  Result Value Ref Range Status   Specimen Description BLOOD  Final   Special Requests Normal  Final   Culture NO GROWTH 6 DAYS  Final   Report Status 12/14/2014 FINAL  Final  C difficile quick scan w PCR reflex ([facility] only)     Status: None   Collection Time: 12/14/14 10:11 AM   Result Value Ref Range Status   C Diff antigen NEGATIVE  Final   C Diff toxin  NEGATIVE  Final   C Diff interpretation Negative for C. difficile  Final  Culture, blood (routine x 2)     Status: None   Collection Time: 12/27/14 11:28 AM  Result Value Ref Range Status   Specimen Description BLOOD  Final   Special Requests Immunocompromised  Final   Culture  Setup Time   Final    YEAST AEROBIC BOTTLE ONLY CRITICAL RESULT CALLED TO, READ BACK BY AND VERIFIED WITH: CATHY SUMMERLIN ON 12/29/14 AT 1000AM BY JEF    Culture CANDIDA PARAPSILOSIS AEROBIC BOTTLE ONLY   Final   Report Status 01/02/2015 FINAL  Final  Culture, blood (routine x 2)     Status: None   Collection Time: 12/27/14 11:36 AM  Result Value Ref Range Status   Specimen Description BLOOD  Final   Special Requests Immunocompromised  Final   Culture  Setup Time   Final    YEAST AEROBIC BOTTLE ONLY CRITICAL RESULT CALLED TO, READ BACK BY AND VERIFIED WITH: LISA ROMERO AT 0111 ON 12/29/14 [facility]WW CONFIRMED BY [place]    Culture CANDIDA PARAPSILOSIS AEROBIC BOTTLE ONLY   Final   Report Status 01/02/2015 FINAL  Final  Cath Tip Culture     Status: None   Collection Time: 12/27/14  4:17 PM  Result Value Ref Range Status   Specimen Description CATH TIP  Final   Special Requests NONE  Final   Culture NO GROWTH 2 DAYS  Final   Report Status 12/30/2014 FINAL  Final  Culture, blood (routine x 2)     Status: None   Collection Time: 12/29/14 12:28 PM  Result Value Ref Range Status   Specimen Description BLOOD RIGHT ARM  Final   Special Requests   Final    BOTTLES DRAWN AEROBIC AND ANAEROBIC  AER 4CC ANA 3CC   Culture  Setup Time   Final    BUDDING YEAST SEEN AEROBIC BOTTLE ONLY CRITICAL RESULT CALLED TO, READ BACK BY AND VERIFIED WITH: BROKE ROBERTSON AT 2230 01/01/15.TSH CONFIRMED BY SDR    Culture CANDIDA PARAPSILOSIS AEROBIC BOTTLE ONLY   Final   Report Status 01/04/2015 FINAL  Final  Culture, blood (routine x 2)     Status:  None   Collection Time: 12/29/14 12:28 PM  Result Value Ref Range Status   Specimen Description BLOOD RIGHT FATTY CASTS  Final   Special Requests BOTTLES DRAWN AEROBIC AND ANAEROBIC  5CC  Final   Culture  Setup Time   Final    YEAST AEROBIC BOTTLE ONLY CRITICAL VALUE NOTED.  VALUE IS CONSISTENT WITH PREVIOUSLY REPORTED AND CALLED VALUE.    Culture CANDIDA PARAPSILOSIS  Final   Report Status 01/04/2015 FINAL  Final  Cath Tip Culture     Status: None   Collection Time: 01/02/15 12:42 PM  Result Value Ref Range Status   Specimen Description CATH TIP  Final   Special Requests Normal  Final   Culture NO GROWTH 3 DAYS  Final   Report Status 01/05/2015 FINAL  Final  Culture, blood (routine x 2)     Status: None   Collection Time: 01/02/15  6:34 PM  Result Value Ref Range Status   Specimen Description BLOOD RIGHT HAND  Final   Special Requests 5 BOTTLES DRAWN AEROBIC AND ANAEROBIC 5ML  Final   Culture  Setup Time   Final    BUDDING YEAST SEEN ANAEROBIC BOTTLE ONLY CRITICAL RESULT CALLED TO, READ BACK BY AND VERIFIED WITH: IRIS GUIDRY AT 2030 01/04/15 SDR  CONFIRMED BY RW    Culture CANDIDA PARAPSILOSIS  Final   Report Status 01/07/2015 FINAL  Final  Culture, blood (routine x 2)     Status: None   Collection Time: 01/05/15  8:59 AM  Result Value Ref Range Status   Specimen Description BLOOD RIGHT ASSIST CONTROL  Final   Special Requests BOTTLES DRAWN AEROBIC AND ANAEROBIC 1ML  Final   Culture NO GROWTH 5 DAYS  Final   Report Status 01/10/2015 FINAL  Final  Culture, blood (routine x 2)     Status: None   Collection Time: 01/05/15  9:10 AM  Result Value Ref Range Status   Specimen Description BLOOD LEFT HAND  Final   Special Requests BOTTLES DRAWN AEROBIC AND ANAEROBIC 1ML  Final   Culture NO GROWTH 5 DAYS  Final   Report Status 01/10/2015 FINAL  Final  Cath Tip Culture     Status: None   Collection Time: 01/05/15 11:30 AM  Result Value Ref Range Status   Specimen Description  CATH TIP  Final   Special Requests NONE  Final   Culture PSEUDOMONAS AERUGINOSA KLEBSIELLA OXYTOCA   Final   Report Status 01/08/2015 FINAL  Final   Organism ID, Bacteria PSEUDOMONAS AERUGINOSA  Final   Organism ID, Bacteria KLEBSIELLA OXYTOCA  Final      Susceptibility   Klebsiella oxytoca - MIC*    AMPICILLIN >=32 RESISTANT Resistant     CEFTAZIDIME <=1 SENSITIVE Sensitive     CEFAZOLIN >=64 RESISTANT Resistant     CEFTRIAXONE 8 SENSITIVE Sensitive     CIPROFLOXACIN <=0.25 SENSITIVE Sensitive     GENTAMICIN <=1 SENSITIVE Sensitive     IMIPENEM <=0.25 SENSITIVE Sensitive     TRIMETH/SULFA <=20 SENSITIVE Sensitive     PIP/TAZO Value in next row Resistant      RESISTANT>=128    * KLEBSIELLA OXYTOCA   Pseudomonas aeruginosa - MIC*    CEFTAZIDIME Value in next row Sensitive      RESISTANT>=128    CIPROFLOXACIN Value in next row Resistant      RESISTANT>=128    GENTAMICIN Value in next row Sensitive      RESISTANT>=128    IMIPENEM Value in next row Resistant      RESISTANT>=128    PIP/TAZO Value in next row Sensitive      SENSITIVE8    * PSEUDOMONAS AERUGINOSA    Coagulation Studies: No results for input(s): LABPROT, INR in the last 72 hours.  Urinalysis: No results for input(s): COLORURINE, LABSPEC, PHURINE, GLUCOSEU, HGBUR, BILIRUBINUR, KETONESUR, PROTEINUR, UROBILINOGEN, NITRITE, LEUKOCYTESUR in the last 72 hours.  Invalid input(s): APPERANCEUR    Imaging: No results found.   Medications:     . albumin human  25 g Intravenous Once  . amiodarone  200 mg Oral Daily  . antiseptic oral rinse  7 mL Mouth Rinse BID  . apixaban  2.5 mg Oral BID  . carvedilol  20 mg Oral Daily  . dronabinol  2.5 mg Oral BID AC  . epoetin (EPOGEN/PROCRIT) injection  14,000 Units Subcutaneous Q M,W,F-HD  . feeding supplement (NEPRO CARB STEADY)  237 mL Oral BID BM  . fluconazole  200 mg Oral Once per day on Sun Tue Thu Sat  . fluconazole  400 mg Oral Once per day on Mon Wed Fri  .  mometasone-formoterol  2 puff Inhalation BID  . pantoprazole  40 mg Oral BID  . potassium chloride  40 mEq Oral BID  . scopolamine  1 patch  Transdermal Q72H   sodium chloride, sodium chloride, acetaminophen **OR** acetaminophen, albuterol, haloperidol lactate, heparin, lidocaine (PF), lidocaine-prilocaine, magnesium hydroxide, metoprolol, ondansetron (ZOFRAN) IV, promethazine, ziprasidone  Assessment/ Plan:  Mr. Narramore is a 17 black male with progressive mantle cell lymphoma, RICE chemo in 11/2014, hx left sided hydronephrosis and hydroureter. S/p ureteral stent placement   Hospital course complicated by sepsis with Klebsiella oxytoca, hypernatremia. Now with fungemia.   1. End Stage Renal Failure:  Acute renal failure due to ATN and now without recovery. Seen and examined on hemodialysis. Sitting chair. Tolerating well.  - Hemodialysis on MWF. Minimal UF. - IV albumin given.  - 4K bath for hypokalemia.   2.  Sepsis with fungemia. A41.9, B49. Status post bacteremia with klebsiella oxytoca: completed two weeks for IV ceftazidime. Subsequently growing candida parapsilosis.  Subcutaneous port removed 01/02/15. - Appreciate ID input.  Currently on fluconazole for 4 weeks. End on 8/21.   3.  Anemia of chronic kidney disease/mantle cell lymphoma: hemoglobin 7.9.  - status post transfusion 1 unit PRBC on 7/27, 7/30, 8/3 - continue epogen 14000 units IV with HD, use cleared by hematology/onc.   4. Secondary Hyperparathyroidism: PTH 126. Phos 2.3.  - not on binders. No indication for activated vitamin D    LOS: [place], [place] 8/5/201610:29 AM

## 2015-01-17 NOTE — Progress Notes (Signed)
[facility] [place] NAME: Caleb Francis    MR#:  [id number]  DATE OF BIRTH:  04/25/1946  SUBJECTIVE:  Still poor oral intake.  sitting up in chair during HD just now. REVIEW OF SYSTEMS:   Review of Systems  Constitutional: Positive for malaise/fatigue. Negative for fever, chills and weight loss.  HENT: Negative for ear discharge, ear pain and nosebleeds.   Eyes: Negative for blurred vision, pain and discharge.  Respiratory: Negative for sputum production, shortness of breath, wheezing and stridor.   Cardiovascular: Negative for chest pain, palpitations, orthopnea and PND.  Gastrointestinal: Negative for nausea, vomiting, abdominal pain and diarrhea.  Genitourinary: Negative for urgency and frequency.  Musculoskeletal: Negative for back pain and joint pain.  Neurological: Positive for weakness. Negative for sensory change, speech change and focal weakness.  Psychiatric/Behavioral: Negative for depression. The patient is not nervous/anxious.    Tolerating Diet:very little Tolerating PT: eval pending   DRUG ALLERGIES:  No Known Allergies VITALS:  Blood pressure 109/56, pulse 97, temperature 97.8 F (36.6 C), temperature source Oral, resp. rate 21, height 6' (1.829 m), weight 68.4 kg (150 lb 12.7 oz), SpO2 100 %. PHYSICAL EXAMINATION:   Physical Exam GENERAL:  69 y.o.-year-old patient lying in the bed with no acute distress. Appears chronically ill and the lethargic. Sitting in the chair during dialysis just now. EYES: Pupils equal, round, reactive to light and accommodation. No scleral icterus. Extraocular muscles intact.  HEENT: Head atraumatic, normocephalic. Oropharynx and nasopharynx clear.  NECK:  Supple, no jugular venous distention. No thyroid enlargement, no tenderness.  LUNGS: Normal breath sounds bilaterally, no wheezing, rales, rhonchi. No use of accessory muscles of respiration.  CARDIOVASCULAR: S1, S2 normal. No  murmurs, rubs, or gallops.  ABDOMEN: Soft, nontender, mild distended. Bowel sounds present. No organomegaly or mass.  EXTREMITIES: No cyanosis, clubbing or edema b/l.  NEUROLOGIC: Cranial nerves II through XII are intact. No focal Motor or sensory deficits b/l.  Weak,severe deconditioning PSYCHIATRIC:  patient is alert and oriented x 3.  SKIN: stage II sacral ulcer.  LABORATORY PANEL:   CBC  Recent Labs Lab 01/17/15 0949  WBC 16.6*  HGB 7.6*  HCT 23.3*  PLT 55*    Chemistries   Recent Labs Lab 01/15/15 0423  01/17/15 0950  NA 138  < > 136  K 2.6*  < > 3.5  CL 100*  < > 98*  CO2 26  < > 26  GLUCOSE 120*  < > 90  BUN 34*  < > 28*  CREATININE 4.50*  < > 3.90*  CALCIUM 7.9*  < > 7.8*  MG 1.8  --   --   < > = values in this interval not displayed. ASSESSMENT AND PLAN:   #1 sepsis: Fungemia with Candida parapsilosis blood cultures 7/15, and again on repeat culture 7/17 + and 7/21 + Appreciate infectious disease consultation.  continue PO fluconazole 400 mg PO for total 4 weeks from first negative bcx per Dr. Ola Spurr.       . * Leukocytosis. Worse, follow-up CBC.  - Port-A-Cath removed on 7/21 by Dr. Lucky Cowboy. Cath tip growing Klebsiella and Pseudomonas (resulted on 7/27) which is likely not significant per ID so not recommending any abx for same unless clinical condition changes and blood c/s turns +  #2 acute renal failure due to ATN,  progressed to ESRD s/p permcath on 7/29, continue HD on M/W/F per Dr. Juleen China.   * Anemia of chronic  disease. Hb down 6.5 and up to 7.9 after 1 unit PRBC transfusion.  no active bleeding,  s/p total 6 unit of PRBC, f/u Hb in am.  continue epogen 14000 units IV with HD.  #3 Mantle cell lymphoma -recvd Neupogen, Prognosis is poor.-followed by Dr Mike Gip -at present given overall pt's decline and varioius infections not sure if pt is even a candidate for further Chemotherapy - further mgmt per Onco   #4 Chronic ileus with h/o GI  bleeding: Clinically improving. No further bleeding. NG tube and rectal tube discontinued 7/12 - Continue PPI, high risk for further bleeding due to thrombocytopenia - Last seen by GI on July 4. No further plans for endoscopy.  #5 deconditioning: Unable to participate in physical therapy due to poor motivation and severe deconditioning. PT to see if he improves  #6 Severe protein calorie malnutrition: Continue Marinol.  Dr. Manuella Ghazi discussed with family and the patient they would not be interested in PEG tube placement. pt's calorie count now stopped due to severe poor po intake. Encourage intake.  #7 encephalopathy: Appreciate neurology consultation. This is likely encephalopathy/delirium due to critical illness. Agree with when necessary Geodon for agitation. Has not been agitated  improved to baseline.  #8 pressure ulcers: Wound care.  #9 left basilic vein superficial extensive thrombosis On eliquis 2.5 mg bid and repeat US upper extremity shows Persistent superficial venous thrombosis noted in the basilic vein.  * Hypokalemia. Improved. K 3.5 today, f/u BMP.   CODE STATUS: DO NOT RESUSCITATE  Case discussed with Care Management/Social Worker. Management plans discussed with the patient, CSW, Dr. Juleen China.  Physical therapy was not able to sit up for greater than one minute on the side of the bed without max assist. He could to sit up for more than one hour during dialysis just now. If patient can continue to sit up during dialysis, he will be discharged to skilled nursing facility this coming week. Will be a very challenging case with long term stay and difficult discharge.  PT evaluation suggested skilled nursing facility placement. Per CM, may need out of state placement.  TOTAL TIME TAKING CARE OF THIS PATIENT: 38 minutes.   More than 50% of the time was spent in counseling/coordination of care.   [name] M.D on 01/17/2015 at 1:54 PM  Between 7am to 6pm - Pager -  [phone]  After 6pm go to [email or web address] - password [email or web address] [facility]  [facility]  Office  [phone]  CC: Primary care physician; No PCP Per Patient

## 2015-01-17 NOTE — Progress Notes (Signed)
ANTIBIOTIC CONSULT NOTE  Pharmacy Consult for renal dose adjustment of fluconazole Indication: Fungemia - candida parapsilosis   No Known Allergies  Patient Measurements: Height: 6' (182.9 cm) Weight: 150 lb 12.7 oz (68.4 kg) IBW/kg (Calculated) : 77.6  Vital Signs: Temp: 98.4 F (36.9 C) (08/05 0945) Temp Source: Oral (08/05 0945) BP: 90/53 mmHg (08/05 1230) Pulse Rate: 88 (08/05 1230)  Labs:  Recent Labs  01/15/15 0423 01/16/15 0821 01/17/15 0949 01/17/15 0950  WBC 15.5* 17.5* 16.6*  --   HGB 6.5* 7.9* 7.6*  --   PLT 73* 64* 55*  --   CREATININE 4.50* 3.09*  --  3.90*   Estimated Creatinine Clearance: 17.8 mL/min (by C-G formula based on Cr of 3.9). No results for input(s): VANCOTROUGH, VANCOPEAK, VANCORANDOM, GENTTROUGH, GENTPEAK, GENTRANDOM, TOBRATROUGH, TOBRAPEAK, TOBRARND, AMIKACINPEAK, AMIKACINTROU, AMIKACIN in the last 72 hours.   Microbiology: Recent Results (from the past 720 hour(s))  Culture, blood (routine x 2)     Status: None   Collection Time: 12/27/14 11:28 AM  Result Value Ref Range Status   Specimen Description BLOOD  Final   Special Requests Immunocompromised  Final   Culture  Setup Time   Final    YEAST AEROBIC BOTTLE ONLY CRITICAL RESULT CALLED TO, READ BACK BY AND VERIFIED WITH: CATHY SUMMERLIN ON 12/29/14 AT 1000AM BY JEF    Culture CANDIDA PARAPSILOSIS AEROBIC BOTTLE ONLY   Final   Report Status 01/02/2015 FINAL  Final  Culture, blood (routine x 2)     Status: None   Collection Time: 12/27/14 11:36 AM  Result Value Ref Range Status   Specimen Description BLOOD  Final   Special Requests Immunocompromised  Final   Culture  Setup Time   Final    YEAST AEROBIC BOTTLE ONLY CRITICAL RESULT CALLED TO, READ BACK BY AND VERIFIED WITH: LISA ROMERO AT 0111 ON 12/29/14 [facility] CONFIRMED BY [place]    Culture CANDIDA PARAPSILOSIS AEROBIC BOTTLE ONLY   Final   Report Status 01/02/2015 FINAL  Final  Cath Tip Culture     Status: None   Collection Time:  12/27/14  4:17 PM  Result Value Ref Range Status   Specimen Description CATH TIP  Final   Special Requests NONE  Final   Culture NO GROWTH 2 DAYS  Final   Report Status 12/30/2014 FINAL  Final  Culture, blood (routine x 2)     Status: None   Collection Time: 12/29/14 12:28 PM  Result Value Ref Range Status   Specimen Description BLOOD RIGHT ARM  Final   Special Requests   Final    BOTTLES DRAWN AEROBIC AND ANAEROBIC  AER 4CC ANA 3CC   Culture  Setup Time   Final    BUDDING YEAST SEEN AEROBIC BOTTLE ONLY CRITICAL RESULT CALLED TO, READ BACK BY AND VERIFIED WITH: BROKE ROBERTSON AT 2230 01/01/15.TSH CONFIRMED BY [facility]    Culture CANDIDA PARAPSILOSIS AEROBIC BOTTLE ONLY   Final   Report Status 01/04/2015 FINAL  Final  Culture, blood (routine x 2)     Status: None   Collection Time: 12/29/14 12:28 PM  Result Value Ref Range Status   Specimen Description BLOOD RIGHT FATTY CASTS  Final   Special Requests BOTTLES DRAWN AEROBIC AND ANAEROBIC  5CC  Final   Culture  Setup Time   Final    YEAST AEROBIC BOTTLE ONLY CRITICAL VALUE NOTED.  VALUE IS CONSISTENT WITH PREVIOUSLY REPORTED AND CALLED VALUE.    Culture CANDIDA PARAPSILOSIS  Final   Report  Status 01/04/2015 FINAL  Final  Cath Tip Culture     Status: None   Collection Time: 01/02/15 12:42 PM  Result Value Ref Range Status   Specimen Description CATH TIP  Final   Special Requests Normal  Final   Culture NO GROWTH 3 DAYS  Final   Report Status 01/05/2015 FINAL  Final  Culture, blood (routine x 2)     Status: None   Collection Time: 01/02/15  6:34 PM  Result Value Ref Range Status   Specimen Description BLOOD RIGHT HAND  Final   Special Requests 5 BOTTLES DRAWN AEROBIC AND ANAEROBIC 5ML  Final   Culture  Setup Time   Final    BUDDING YEAST SEEN ANAEROBIC BOTTLE ONLY CRITICAL RESULT CALLED TO, READ BACK BY AND VERIFIED WITH: IRIS GUIDRY AT 2030 01/04/15 SDR  CONFIRMED BY RW    Culture CANDIDA PARAPSILOSIS  Final   Report Status  01/07/2015 FINAL  Final  Culture, blood (routine x 2)     Status: None   Collection Time: 01/05/15  8:59 AM  Result Value Ref Range Status   Specimen Description BLOOD RIGHT ASSIST CONTROL  Final   Special Requests BOTTLES DRAWN AEROBIC AND ANAEROBIC 1ML  Final   Culture NO GROWTH 5 DAYS  Final   Report Status 01/10/2015 FINAL  Final  Culture, blood (routine x 2)     Status: None   Collection Time: 01/05/15  9:10 AM  Result Value Ref Range Status   Specimen Description BLOOD LEFT HAND  Final   Special Requests BOTTLES DRAWN AEROBIC AND ANAEROBIC 1ML  Final   Culture NO GROWTH 5 DAYS  Final   Report Status 01/10/2015 FINAL  Final  Cath Tip Culture     Status: None   Collection Time: 01/05/15 11:30 AM  Result Value Ref Range Status   Specimen Description CATH TIP  Final   Special Requests NONE  Final   Culture PSEUDOMONAS AERUGINOSA KLEBSIELLA OXYTOCA   Final   Report Status 01/08/2015 FINAL  Final   Organism ID, Bacteria PSEUDOMONAS AERUGINOSA  Final   Organism ID, Bacteria KLEBSIELLA OXYTOCA  Final      Susceptibility   Klebsiella oxytoca - MIC*    AMPICILLIN >=32 RESISTANT Resistant     CEFTAZIDIME <=1 SENSITIVE Sensitive     CEFAZOLIN >=64 RESISTANT Resistant     CEFTRIAXONE 8 SENSITIVE Sensitive     CIPROFLOXACIN <=0.25 SENSITIVE Sensitive     GENTAMICIN <=1 SENSITIVE Sensitive     IMIPENEM <=0.25 SENSITIVE Sensitive     TRIMETH/SULFA <=20 SENSITIVE Sensitive     PIP/TAZO Value in next row Resistant      RESISTANT>=128    * KLEBSIELLA OXYTOCA   Pseudomonas aeruginosa - MIC*    CEFTAZIDIME Value in next row Sensitive      RESISTANT>=128    CIPROFLOXACIN Value in next row Resistant      RESISTANT>=128    GENTAMICIN Value in next row Sensitive      RESISTANT>=128    IMIPENEM Value in next row Resistant      RESISTANT>=128    PIP/TAZO Value in next row Sensitive      SENSITIVE8    * PSEUDOMONAS AERUGINOSA    Medical History: Past Medical History  Diagnosis Date   . Arthritis   . Hypertension   . RA (rheumatoid arthritis)   . Anemia   . Mantle cell lymphoma     Dr Mike Gip  . History of nuclear stress test  a. 12/2013: low risk, no sig ischemia, no EKG changes, no artifact, EF 63%  . Chronic kidney disease   . Sepsis     Dr Ola Spurr    Medications:  Anti-infectives    Start     Dose/Rate Route Frequency Ordered Stop   01/11/15 1800  fluconazole (DIFLUCAN) tablet 200 mg     200 mg Oral Once per day on Sun Tue Thu Sat 01/10/15 1423 02/02/15 2359   01/10/15 1800  fluconazole (DIFLUCAN) tablet 400 mg     400 mg Oral Once per day on Mon Wed Fri 01/10/15 1400 02/02/15 2359   01/10/15 1230  fluconazole (DIFLUCAN) tablet 400 mg  Status:  Discontinued     400 mg Oral Daily 01/10/15 1219 01/10/15 1400   01/02/15 1600  ceFAZolin (ANCEF) IVPB 1 g/50 mL premix     1 g 100 mL/hr over 30 Minutes Intravenous  Once 01/02/15 1550 01/02/15 1621   12/31/14 0000  vancomycin (VANCOCIN) IVPB 750 mg/150 ml premix  Status:  Discontinued     750 mg 150 mL/hr over 60 Minutes Intravenous Once per day on Mon Thu Sat 12/30/14 1126 12/30/14 1131   12/30/14 1200  vancomycin (VANCOCIN) IVPB 750 mg/150 ml premix  Status:  Discontinued     750 mg 150 mL/hr over 60 Minutes Intravenous Once per day on Mon Thu Sat 12/29/14 0903 12/30/14 1126   12/30/14 1129  vancomycin (VANCOCIN) IVPB 750 mg/150 ml premix  Status:  Discontinued    Comments:  Administer dose during last hour of dialysis as long as pre-dialysis random level is less then 20 mcg/mL   750 mg 150 mL/hr over 60 Minutes Intravenous Every Dialysis 12/30/14 1131 12/30/14 1527   12/29/14 1130  micafungin (MYCAMINE) 100 mg in sodium chloride 0.9 % 100 mL IVPB  Status:  Discontinued     100 mg 100 mL/hr over 1 Hours Intravenous Daily 12/29/14 1117 01/10/15 1219   12/28/14 1200  vancomycin (VANCOCIN) IVPB 750 mg/150 ml premix  Status:  Discontinued     750 mg 150 mL/hr over 60 Minutes Intravenous Once per day on  Tue Thu Sat 12/27/14 1210 12/29/14 0903   12/27/14 1800  fluconazole (DIFLUCAN) IVPB 200 mg  Status:  Discontinued     200 mg 100 mL/hr over 60 Minutes Intravenous Every 24 hours 12/27/14 1402 12/27/14 1404   12/27/14 1800  fluconazole (DIFLUCAN) IVPB 100 mg  Status:  Discontinued     100 mg 50 mL/hr over 60 Minutes Intravenous Every 24 hours 12/27/14 1404 12/29/14 1117   12/27/14 1400  piperacillin-tazobactam (ZOSYN) IVPB 3.375 g  Status:  Discontinued     3.375 g 12.5 mL/hr over 240 Minutes Intravenous 3 times per day 12/27/14 1049 12/27/14 1210   12/27/14 1300  vancomycin (VANCOCIN) 1,500 mg in sodium chloride 0.9 % 500 mL IVPB     1,500 mg 250 mL/hr over 120 Minutes Intravenous  Once 12/27/14 1210 12/27/14 1521   12/27/14 1230  piperacillin-tazobactam (ZOSYN) IVPB 3.375 g  Status:  Discontinued     3.375 g 12.5 mL/hr over 240 Minutes Intravenous Every 12 hours 12/27/14 1210 12/30/14 1527   12/27/14 1100  vancomycin (VANCOCIN) powder 1,000 mg  Status:  Discontinued     1,000 mg Other To Surgery 12/27/14 1049 12/27/14 1119   12/27/14 1100  vancomycin (VANCOCIN) IVPB 1000 mg/200 mL premix  Status:  Discontinued     1,000 mg 200 mL/hr over 60 Minutes Intravenous Every 24 hours 12/27/14 1049  12/27/14 1210   12/27/14 0000  ceFAZolin (ANCEF) IVPB 1 g/50 mL premix    Comments:  Send with pt to OR   1 g 100 mL/hr over 30 Minutes Intravenous On call 12/26/14 2208 12/28/14 0000   12/19/14 1800  vancomycin (VANCOCIN) IVPB 750 mg/150 ml premix     750 mg 150 mL/hr over 60 Minutes Intravenous  Once 12/19/14 1515 12/19/14 1940   12/15/14 0630  vancomycin (VANCOCIN) IVPB 750 mg/150 ml premix     750 mg 150 mL/hr over 60 Minutes Intravenous  Once 12/15/14 0616 12/15/14 0726   12/14/14 1300  vancomycin (VANCOCIN) IVPB 750 mg/150 ml premix  Status:  Discontinued     750 mg 150 mL/hr over 60 Minutes Intravenous Every 48 hours 12/13/14 1107 12/14/14 0604   12/14/14 0603  vancomycin (VANCOCIN) IVPB  750 mg/150 ml premix  Status:  Discontinued     750 mg 150 mL/hr over 60 Minutes Intravenous Daily PRN 12/14/14 0604 12/21/14 1110   12/09/14 0900  erythromycin 250 mg in sodium chloride 0.9 % 100 mL IVPB  Status:  Discontinued     250 mg 100 mL/hr over 60 Minutes Intravenous 3 times per day 12/09/14 0859 12/13/14 1054   12/08/14 1100  metroNIDAZOLE (FLAGYL) IVPB 500 mg  Status:  Discontinued     500 mg 100 mL/hr over 60 Minutes Intravenous Every 8 hours 12/08/14 0958 12/11/14 1048   12/08/14 0900  vancomycin (VANCOCIN) IVPB 750 mg/150 ml premix  Status:  Discontinued     750 mg 150 mL/hr over 60 Minutes Intravenous Every 24 hours 12/07/14 1102 12/12/14 0955   12/07/14 1030  cefTAZidime (FORTAZ) 2 g in dextrose 5 % 50 mL IVPB  Status:  Discontinued     2 g 100 mL/hr over 30 Minutes Intravenous Every 24 hours 12/07/14 1021 12/11/14 1048   12/07/14 0700  vancomycin (VANCOCIN) IVPB 1000 mg/200 mL premix  Status:  Discontinued     1,000 mg 200 mL/hr over 60 Minutes Intravenous Every 24 hours 12/06/14 2225 12/07/14 1102   12/06/14 2200  piperacillin-tazobactam (ZOSYN) IVPB 4.5 g  Status:  Discontinued     4.5 g 200 mL/hr over 30 Minutes Intravenous 3 times per day 12/06/14 1946 12/07/14 1011   12/06/14 2000  vancomycin (VANCOCIN) IVPB 1000 mg/200 mL premix     1,000 mg 200 mL/hr over 60 Minutes Intravenous STAT 12/06/14 1947 12/06/14 2113   12/06/14 2000  cefTRIAXone (ROCEPHIN) 1 g in dextrose 5 % 50 mL IVPB - Premix  Status:  Discontinued     1 g 100 mL/hr over 30 Minutes Intravenous Every 24 hours 12/06/14 1959 12/07/14 1011   12/06/14 1945  piperacillin-tazobactam (ZOSYN) IVPB 3.375 g  Status:  Discontinued     3.375 g 100 mL/hr over 30 Minutes Intravenous 4 times per day 12/06/14 1940 12/06/14 2007   12/06/14 1945  vancomycin (VANCOCIN) IVPB 1000 mg/200 mL premix  Status:  Discontinued     1,000 mg 200 mL/hr over 60 Minutes Intravenous Every 12 hours 12/06/14 1940 12/06/14 2008    12/01/14 0945  piperacillin-tazobactam (ZOSYN) IVPB 3.375 g  Status:  Discontinued     3.375 g 12.5 mL/hr over 240 Minutes Intravenous 3 times per day 12/01/14 0936 12/03/14 1337   11/30/14 1000  cefTRIAXone (ROCEPHIN) 2 g in dextrose 5 % 50 mL IVPB - Premix  Status:  Discontinued    Comments:  Entered per MD progress note   2 g 100 mL/hr over 30  Minutes Intravenous Every 24 hours 11/29/14 2157 12/06/14 1940   11/29/14 1415  cefTRIAXone (ROCEPHIN) 1 g in dextrose 5 % 50 mL IVPB - Premix     1 g 100 mL/hr over 30 Minutes Intravenous  Once 11/29/14 1406 11/29/14 1452   11/29/14 1414  cefTRIAXone (ROCEPHIN) 20 MG/ML IVPB 50 mL    Comments:  MONAR, NELLIE: cabinet override      11/29/14 1414 11/29/14 1424     Assessment: Pharmacy consulted to adjust fluconazole dose for renal function in this 69 year old male with ESRD on HD 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  Per ID will plan to treat for 4 week since first negative blood culture on 7/24  Plan:  Continue current orders for fluconazole 400mg  PO QPM on HD days and 200mg  PO QPM on all other days. Doses to be given after dialysis. Scheduled to stop on 8/21, 4 weeks from negative blood culture.  Patient tentatively scheduled for HD on MWF, however nephrology plans to evaluate daily for dialysis need. Will order doses according to current HD schedule (MWF). Pharmacy will need to follow closely and adjust dose schedule if HD schedule changes or extra sessions needed.  Rexene Edison, PharmD Clinical Pharmacist  01/17/2015,12:49 PM

## 2015-01-17 NOTE — Progress Notes (Signed)
Son Arav was updated of pt move to room 122. Updated that pt had just went to HD. No further concerns.

## 2015-01-17 NOTE — Progress Notes (Signed)
HD START 

## 2015-01-17 NOTE — Care Management (Addendum)
Spoke with Sherlon Handing on the phone.  Discussed that patient at present time is sitting for his dialysis .  Discussed that patient if continues to tolerate sitting for dialysis may could anticipate discharge to a skilled nursing facility within the next week.  Udated CSW.  Facility preference would be [facility].  Notified dialysis coordinator and she will proceed with scheduling chair time in an outpatient clinic in anticipation that patient will discharge to skilled nursing.    Patient to transfer to 1C. Informed Spenser that patient is transferring to room 122 and of current plan.  H does not verbalize any concerns.  Reported off to unit CM

## 2015-01-17 NOTE — Care Management (Signed)
Primary nurse relays that family has stated that they did not know goal was for patient to be able to sit up for dialysis.  This has been discussed with family in the past.   Have asked all staff to notify CM when family arrives on unit so CM can have face to face discussion rather than discuss current goals of treatment over the phone.  Dr Juleen China states that patient is go down to dialysis in the chair today- not the bed.  Informed primary nurse.

## 2015-01-17 NOTE — Plan of Care (Signed)
Problem: Discharge Progression Outcomes Goal: Other Discharge Outcomes/Goals Outcome: Progressing Patient was transferred to 1C from  Telemetry after Dialysis today No fluid removed in dialysis, but albumin was given Patient tolerated sitting for his entire stay in dialysis  Patient uses urinal, bedpan and BSC and is a 1 to 2 person assist with gait belt  No c/o pain this shift VSS Foam Dressing changed on coccyx Patient still has a poor appetite Marinol given as scheduled  PPD test ordered per case manager awaiting possible discharge to SNF

## 2015-01-17 NOTE — Care Management Important Message (Signed)
Important Message  Patient Details  Name: Caleb Francis MRN: [id number] Date of Birth: 03-Aug-1945   Medicare Important Message Given:  Yes-second notification given    [name] 01/17/2015, 10:22 AM

## 2015-01-17 NOTE — Progress Notes (Signed)
Brief Nutrition Note:   Pt appetite remains poor; noted episode of emesis yesterday. Documentation of po intake limited but per documented intake pt continues with bites/sips. Noted pt sat up in chair for dialysis today; noted plan for discharge to SNF sometime next week. Supplements continued, on Regular Diet, on marinol.   Will sign off. Please re-consult RD if needed.  Kerman Passey [place], [place], LDN [phone] Pager

## 2015-01-18 LAB — CBC
HCT: 19.3 % — ABNORMAL LOW (ref 40.0–52.0)
Hemoglobin: 6.6 g/dL — ABNORMAL LOW (ref 13.0–18.0)
MCH: 30.6 pg (ref 26.0–34.0)
MCHC: 34 g/dL (ref 32.0–36.0)
MCV: 89.9 fL (ref 80.0–100.0)
Platelets: 40 10*3/uL — ABNORMAL LOW (ref 150–440)
RBC: 2.15 MIL/uL — ABNORMAL LOW (ref 4.40–5.90)
RDW: 17.2 % — ABNORMAL HIGH (ref 11.5–14.5)
WBC: 13 10*3/uL — ABNORMAL HIGH (ref 3.8–10.6)

## 2015-01-18 NOTE — Plan of Care (Signed)
Problem: Discharge Progression Outcomes Goal: Other Discharge Outcomes/Goals Outcome: Progressing Plan of care progress to goal: Notified MD of HGB at 6.6. No interventions. Poor PO intake still.  C/o pain on left abdomen, tylenol given PRN for pain.  Had dialysis yesterday, patient put out 231ml of urine today.

## 2015-01-18 NOTE — Progress Notes (Signed)
[place] [facility] [place] NAME: Caleb Francis    MR#:  [id number]  DATE OF BIRTH:  May 01, 1946  SUBJECTIVE:  Still poor oral intake.  No complains. HB droped today.  REVIEW OF SYSTEMS:   Review of Systems  Constitutional: Positive for malaise/fatigue. Negative for fever, chills and weight loss.  HENT: Negative for ear discharge, ear pain and nosebleeds.   Eyes: Negative for blurred vision, pain and discharge.  Respiratory: Negative for sputum production, shortness of breath, wheezing and stridor.   Cardiovascular: Negative for chest pain, palpitations, orthopnea and PND.  Gastrointestinal: Negative for nausea, vomiting, abdominal pain and diarrhea.  Genitourinary: Negative for urgency and frequency.  Musculoskeletal: Negative for back pain and joint pain.  Neurological: Positive for weakness. Negative for sensory change, speech change and focal weakness.  Psychiatric/Behavioral: Negative for depression. The patient is not nervous/anxious.    Tolerating Diet:very little Tolerating PT: eval pending   DRUG ALLERGIES:  No Known Allergies VITALS:  Blood pressure 95/42, pulse 88, temperature 97.5 F (36.4 C), temperature source Oral, resp. rate 18, height 6' (1.829 m), weight 69.128 kg (152 lb 6.4 oz), SpO2 93 %. PHYSICAL EXAMINATION:   Physical Exam GENERAL:  69 y.o.-year-old patient lying in the bed with no acute distress. Appears chronically ill and the lethargic. Sitting in the chair during dialysis just now. EYES: Pupils equal, round, reactive to light and accommodation. No scleral icterus. Extraocular muscles intact.  HEENT: Head atraumatic, normocephalic. Oropharynx and nasopharynx clear.  NECK:  Supple, no jugular venous distention. No thyroid enlargement, no tenderness.  LUNGS: Normal breath sounds bilaterally, no wheezing, rales, rhonchi. No use of accessory muscles of respiration.  CARDIOVASCULAR: S1, S2 normal. No murmurs,  rubs, or gallops.  ABDOMEN: Soft, nontender, mild distended. Bowel sounds present. No organomegaly or mass.  EXTREMITIES: No cyanosis, clubbing or edema b/l.  NEUROLOGIC: Cranial nerves II through XII are intact. No focal Motor or sensory deficits b/l.  Weak,severe deconditioning PSYCHIATRIC:  patient is alert and oriented x 3.  SKIN: stage II sacral ulcer.  LABORATORY PANEL:   CBC  Recent Labs Lab 01/18/15 0543  WBC 13.0*  HGB 6.6*  HCT 19.3*  PLT 40*    Chemistries   Recent Labs Lab 01/15/15 0423  01/17/15 0950  NA 138  < > 136  K 2.6*  < > 3.5  CL 100*  < > 98*  CO2 26  < > 26  GLUCOSE 120*  < > 90  BUN 34*  < > 28*  CREATININE 4.50*  < > 3.90*  CALCIUM 7.9*  < > 7.8*  MG 1.8  --   --   < > = values in this interval not displayed. ASSESSMENT AND PLAN:   #1 sepsis: Fungemia with Candida parapsilosis blood cultures 7/15, and again on repeat culture 7/17 + and 7/21 + Appreciate infectious disease consultation.  continue PO fluconazole 400 mg PO for total 4 weeks from first negative bcx per Dr. Ola Spurr.       . * Leukocytosis. Worse, follow-up CBC.  - Port-A-Cath removed on 7/21 by Dr. Lucky Cowboy. Cath tip growing Klebsiella and Pseudomonas (resulted on 7/27) which is likely not significant per ID so not recommending any abx for same unless clinical condition changes and blood c/s turns +  #2 acute renal failure due to ATN,  progressed to ESRD s/p permcath on 7/29, continue HD on M/W/F per Dr. Juleen China.   * Anemia of chronic disease. Hb  down 6.5 and up to 7.9 after 1 unit PRBC transfusion.  no active bleeding,  s/p total 6 unit of PRBC, f/u Hb in am.  continue epogen 14000 units IV with HD.  Hb low today again- will follow.  #3 Mantle cell lymphoma -recvd Neupogen, Prognosis is poor.-followed by Dr Mike Gip -at present given overall pt's decline and varioius infections not sure if pt is even a candidate for further Chemotherapy - further mgmt per Onco   #4  Chronic ileus with h/o GI bleeding: Clinically improving. No further bleeding. NG tube and rectal tube discontinued 7/12 - Continue PPI, high risk for further bleeding due to thrombocytopenia - Last seen by GI on July 4. No further plans for endoscopy.  #5 deconditioning: Unable to participate in physical therapy due to poor motivation and severe deconditioning. PT to see if he improves  #6 Severe protein calorie malnutrition: Continue Marinol.  Dr. Manuella Ghazi discussed with family and the patient they would not be interested in PEG tube placement. pt's calorie count now stopped due to severe poor po intake. Encourage intake.  #7 encephalopathy: Appreciate neurology consultation. This is likely encephalopathy/delirium due to critical illness. Agree with when necessary Geodon for agitation. Has not been agitated  improved to baseline.  #8 pressure ulcers: Wound care.  #9 left basilic vein superficial extensive thrombosis On eliquis 2.5 mg bid and repeat US upper extremity shows Persistent superficial venous thrombosis noted in the basilic vein.  * Hypokalemia. Improved. K 3.5 today, f/u BMP.   CODE STATUS: DO NOT RESUSCITATE  Case discussed with Care Management/Social Worker. Management plans discussed with the patient,   Physical therapy was not able to sit up for greater than one minute on the side of the bed without max assist. He could to sit up for more than one hour during dialysis just now. If patient can continue to sit up during dialysis, he will be discharged to skilled nursing facility this coming week. Will be a very challenging case with long term stay and difficult discharge.  PT evaluation suggested skilled nursing facility placement. Per CM, may need out of state placement.  TOTAL TIME TAKING CARE OF THIS PATIENT: 38 minutes.   More than 50% of the time was spent in counseling/coordination of care.   Vaughan Basta M.D on 01/18/2015 at 11:23 PM  Between 7am to 6pm -  Pager - [phone]  After 6pm go to [email or web address] - password [email or web address] [facility]  [facility]  Office  [phone]  CC: Primary care physician; No PCP Per Patient

## 2015-01-18 NOTE — Progress Notes (Signed)
Subjective:   Underwent dialysis yesterday.  Tolerated fair.  No acute complaints this morning. Appetite appears to be poor as he ate very little breakfast.    Objective:  Vital signs in last 24 hours:  Temp:  [97.8 F (36.6 C)-98.5 F (36.9 C)] 98.2 F (36.8 C) (08/06 0803) Pulse Rate:  [85-97] 89 (08/06 0803) Resp:  [16-28] 21 (08/05 1300) BP: (85-109)/(51-64) 108/55 mmHg (08/06 0803) SpO2:  [97 %-100 %] 100 % (08/06 0803) Weight:  [69.128 kg (152 lb 6.4 oz)] 69.128 kg (152 lb 6.4 oz) (08/06 0500)  Weight change: 0.36 kg (12.7 oz) Filed Weights   01/17/15 0446 01/17/15 0945 01/18/15 0500  Weight: 68.04 kg (150 lb) 68.4 kg (150 lb 12.7 oz) 69.128 kg (152 lb 6.4 oz)    Intake/Output: I/O last 3 completed shifts: In: -  Out: 625 [Urine:625]   Intake/Output this shift:  Total I/O In: -  Out: 200 [Urine:200]  Physical Exam: General: chronically ill appearing  Head: oral mucosa moist  Eyes: Anicteric  Neck: Supple, trachea midline  Lungs:  Clear to auscultation normal effort  Heart: S1S2 no rubs  Abdomen:  Soft, BS present  Extremities: No LE edema  Neurologic: Awake, alert, oriented today  Skin: No acute rashes  Access: [facility] permcath  01/10/15 Dr. Lucky Cowboy    Basic Metabolic Panel:  Recent Labs Lab 01/12/15 4765 01/13/15 0427 01/14/15 0427 01/15/15 0423 01/16/15 0821 01/17/15 0950  NA 140 138 139 138 138 136  K 2.5* 3.3* 3.0* 2.6* 3.0* 3.5  CL 101 101 101 100* 99* 98*  CO2 26 25 28 26 27 26   GLUCOSE 105* 91 98 120* 113* 90  BUN 35* 41* 30* 34* 21* 28*  CREATININE 4.91* 5.02* 3.93* 4.50* 3.09* 3.90*  CALCIUM 7.9* 7.8* 7.8* 7.9* 7.8* 7.8*  MG 1.6* 1.9  --  1.8  --   --   PHOS  --   --   --   --   --  1.4*    Liver Function Tests:  Recent Labs Lab 01/17/15 0950  ALBUMIN 1.9*   No results for input(s): LIPASE, AMYLASE in the last 168 hours. No results for input(s): AMMONIA in the last 168 hours.  CBC:  Recent Labs Lab 01/14/15 0427 01/15/15 0423  01/16/15 0821 01/17/15 0949 01/18/15 0543  WBC 14.7* 15.5* 17.5* 16.6* 13.0*  HGB 7.1* 6.5* 7.9* 7.6* 6.6*  HCT 21.7* 19.8* 23.7* 23.3* 19.3*  MCV 88.7 89.2 89.1 89.9 89.9  PLT 80* 73* 64* 55* 40*    Cardiac Enzymes: No results for input(s): CKTOTAL, CKMB, CKMBINDEX, TROPONINI in the last 168 hours.  BNP: Invalid input(s): POCBNP  CBG:  Recent Labs Lab 01/11/15 1155 01/11/15 1622  GLUCAP 130* 110*    Microbiology: Results for orders placed or performed during the hospital encounter of 11/29/14  MRSA PCR Screening     Status: None   Collection Time: 11/29/14 12:13 PM  Result Value Ref Range Status   MRSA by PCR NEGATIVE NEGATIVE Final    Comment:        The GeneXpert MRSA Assay (FDA approved for NASAL specimens only), is one component of a comprehensive MRSA colonization surveillance program. It is not intended to diagnose MRSA infection nor to guide or monitor treatment for MRSA infections.   Culture, blood (routine x 2)     Status: None   Collection Time: 11/29/14  1:19 PM  Result Value Ref Range Status   Specimen Description BLOOD  Final  Special Requests Immunocompromised  Final   Culture NO GROWTH 5 DAYS  Final   Report Status 12/04/2014 FINAL  Final  Culture, blood (routine x 2)     Status: None   Collection Time: 11/29/14  3:04 PM  Result Value Ref Range Status   Specimen Description Blood  Final   Special Requests Immunocompromised  Final   Report Status 01/03/2015 FINAL  Final  Urine culture     Status: None   Collection Time: 11/30/14 10:12 AM  Result Value Ref Range Status   Specimen Description URINE, CATHETERIZED  Final   Special Requests Immunocompromised  Final   Culture NO GROWTH 2 DAYS  Final   Report Status 12/02/2014 FINAL  Final  Culture, blood (routine x 2)     Status: None   Collection Time: 12/06/14  8:08 PM  Result Value Ref Range Status   Specimen Description BLOOD  Final   Special Requests NONE  Final   Culture  Setup Time    Final    GRAM POSITIVE COCCI IN BOTH AEROBIC AND ANAEROBIC BOTTLES CRITICAL RESULT CALLED TO, READ BACK BY AND VERIFIED WITH: JENNIFER BEZARD AT 1157 12/08/14.PMH CONFIRMED BY [facility]    Culture   Final    STAPHYLOCOCCUS AURICULARIS IN BOTH AEROBIC AND ANAEROBIC BOTTLES    Report Status 12/11/2014 FINAL  Final   Organism ID, Bacteria STAPHYLOCOCCUS AURICULARIS  Final      Susceptibility   Staphylococcus auricularis - MIC*    CIPROFLOXACIN >=8 RESISTANT Resistant     ERYTHROMYCIN >=8 RESISTANT Resistant     GENTAMICIN <=0.5 SENSITIVE Sensitive     OXACILLIN >=4 RESISTANT Resistant     TETRACYCLINE <=1 SENSITIVE Sensitive     VANCOMYCIN 1 SENSITIVE Sensitive     CLINDAMYCIN <=0.25 SENSITIVE Sensitive     TRIMETH/SULFA Value in next row Sensitive      SENSITIVE<=20    LEVOFLOXACIN Value in next row Intermediate      INTERMEDIATE4    * STAPHYLOCOCCUS AURICULARIS  Culture, blood (routine x 2)     Status: None   Collection Time: 12/06/14  8:40 PM  Result Value Ref Range Status   Specimen Description BLOOD  Final   Special Requests NONE  Final   Culture NO GROWTH 5 DAYS  Final   Report Status 12/11/2014 FINAL  Final  Culture, blood (routine x 2)     Status: None   Collection Time: 12/08/14 11:40 AM  Result Value Ref Range Status   Specimen Description BLOOD  Final   Special Requests Normal  Final   Culture NO GROWTH 6 DAYS  Final   Report Status 12/14/2014 FINAL  Final  Culture, blood (routine x 2)     Status: None   Collection Time: 12/08/14 11:49 AM  Result Value Ref Range Status   Specimen Description BLOOD  Final   Special Requests Normal  Final   Culture NO GROWTH 6 DAYS  Final   Report Status 12/14/2014 FINAL  Final  C difficile quick scan w PCR reflex ([facility] only)     Status: None   Collection Time: 12/14/14 10:11 AM  Result Value Ref Range Status   C Diff antigen NEGATIVE  Final   C Diff toxin NEGATIVE  Final   C Diff interpretation Negative for C. difficile  Final   Culture, blood (routine x 2)     Status: None   Collection Time: 12/27/14 11:28 AM  Result Value Ref Range Status   Specimen Description BLOOD  Final   Special Requests Immunocompromised  Final   Culture  Setup Time   Final    YEAST AEROBIC BOTTLE ONLY CRITICAL RESULT CALLED TO, READ BACK BY AND VERIFIED WITH: CATHY SUMMERLIN ON 12/29/14 AT 1000AM BY JEF    Culture CANDIDA PARAPSILOSIS AEROBIC BOTTLE ONLY   Final   Report Status 01/02/2015 FINAL  Final  Culture, blood (routine x 2)     Status: None   Collection Time: 12/27/14 11:36 AM  Result Value Ref Range Status   Specimen Description BLOOD  Final   Special Requests Immunocompromised  Final   Culture  Setup Time   Final    YEAST AEROBIC BOTTLE ONLY CRITICAL RESULT CALLED TO, READ BACK BY AND VERIFIED WITH: LISA ROMERO AT 0111 ON 12/29/14 [facility] CONFIRMED BY [place]    Culture CANDIDA PARAPSILOSIS AEROBIC BOTTLE ONLY   Final   Report Status 01/02/2015 FINAL  Final  Cath Tip Culture     Status: None   Collection Time: 12/27/14  4:17 PM  Result Value Ref Range Status   Specimen Description CATH TIP  Final   Special Requests NONE  Final   Culture NO GROWTH 2 DAYS  Final   Report Status 12/30/2014 FINAL  Final  Culture, blood (routine x 2)     Status: None   Collection Time: 12/29/14 12:28 PM  Result Value Ref Range Status   Specimen Description BLOOD RIGHT ARM  Final   Special Requests   Final    BOTTLES DRAWN AEROBIC AND ANAEROBIC  AER 4CC ANA 3CC   Culture  Setup Time   Final    BUDDING YEAST SEEN AEROBIC BOTTLE ONLY CRITICAL RESULT CALLED TO, READ BACK BY AND VERIFIED WITH: BROKE ROBERTSON AT 2230 01/01/15.TSH CONFIRMED BY SDR    Culture CANDIDA PARAPSILOSIS AEROBIC BOTTLE ONLY   Final   Report Status 01/04/2015 FINAL  Final  Culture, blood (routine x 2)     Status: None   Collection Time: 12/29/14 12:28 PM  Result Value Ref Range Status   Specimen Description BLOOD RIGHT FATTY CASTS  Final   Special Requests BOTTLES  DRAWN AEROBIC AND ANAEROBIC  5CC  Final   Culture  Setup Time   Final    YEAST AEROBIC BOTTLE ONLY CRITICAL VALUE NOTED.  VALUE IS CONSISTENT WITH PREVIOUSLY REPORTED AND CALLED VALUE.    Culture CANDIDA PARAPSILOSIS  Final   Report Status 01/04/2015 FINAL  Final  Cath Tip Culture     Status: None   Collection Time: 01/02/15 12:42 PM  Result Value Ref Range Status   Specimen Description CATH TIP  Final   Special Requests Normal  Final   Culture NO GROWTH 3 DAYS  Final   Report Status 01/05/2015 FINAL  Final  Culture, blood (routine x 2)     Status: None   Collection Time: 01/02/15  6:34 PM  Result Value Ref Range Status   Specimen Description BLOOD RIGHT HAND  Final   Special Requests 5 BOTTLES DRAWN AEROBIC AND ANAEROBIC 5ML  Final   Culture  Setup Time   Final    BUDDING YEAST SEEN ANAEROBIC BOTTLE ONLY CRITICAL RESULT CALLED TO, READ BACK BY AND VERIFIED WITH: IRIS GUIDRY AT 2030 01/04/15 SDR  CONFIRMED BY RW    Culture CANDIDA PARAPSILOSIS  Final   Report Status 01/07/2015 FINAL  Final  Culture, blood (routine x 2)     Status: None   Collection Time: 01/05/15  8:59 AM  Result Value Ref Range Status  Specimen Description BLOOD RIGHT ASSIST CONTROL  Final   Special Requests BOTTLES DRAWN AEROBIC AND ANAEROBIC 1ML  Final   Culture NO GROWTH 5 DAYS  Final   Report Status 01/10/2015 FINAL  Final  Culture, blood (routine x 2)     Status: None   Collection Time: 01/05/15  9:10 AM  Result Value Ref Range Status   Specimen Description BLOOD LEFT HAND  Final   Special Requests BOTTLES DRAWN AEROBIC AND ANAEROBIC 1ML  Final   Culture NO GROWTH 5 DAYS  Final   Report Status 01/10/2015 FINAL  Final  Cath Tip Culture     Status: None   Collection Time: 01/05/15 11:30 AM  Result Value Ref Range Status   Specimen Description CATH TIP  Final   Special Requests NONE  Final   Culture PSEUDOMONAS AERUGINOSA KLEBSIELLA OXYTOCA   Final   Report Status 01/08/2015 FINAL  Final    Organism ID, Bacteria PSEUDOMONAS AERUGINOSA  Final   Organism ID, Bacteria KLEBSIELLA OXYTOCA  Final      Susceptibility   Klebsiella oxytoca - MIC*    AMPICILLIN >=32 RESISTANT Resistant     CEFTAZIDIME <=1 SENSITIVE Sensitive     CEFAZOLIN >=64 RESISTANT Resistant     CEFTRIAXONE 8 SENSITIVE Sensitive     CIPROFLOXACIN <=0.25 SENSITIVE Sensitive     GENTAMICIN <=1 SENSITIVE Sensitive     IMIPENEM <=0.25 SENSITIVE Sensitive     TRIMETH/SULFA <=20 SENSITIVE Sensitive     PIP/TAZO Value in next row Resistant      RESISTANT>=128    * KLEBSIELLA OXYTOCA   Pseudomonas aeruginosa - MIC*    CEFTAZIDIME Value in next row Sensitive      RESISTANT>=128    CIPROFLOXACIN Value in next row Resistant      RESISTANT>=128    GENTAMICIN Value in next row Sensitive      RESISTANT>=128    IMIPENEM Value in next row Resistant      RESISTANT>=128    PIP/TAZO Value in next row Sensitive      SENSITIVE8    * PSEUDOMONAS AERUGINOSA    Coagulation Studies: No results for input(s): LABPROT, INR in the last 72 hours.  Urinalysis: No results for input(s): COLORURINE, LABSPEC, PHURINE, GLUCOSEU, HGBUR, BILIRUBINUR, KETONESUR, PROTEINUR, UROBILINOGEN, NITRITE, LEUKOCYTESUR in the last 72 hours.  Invalid input(s): APPERANCEUR    Imaging: No results found.   Medications:     . amiodarone  200 mg Oral Daily  . antiseptic oral rinse  7 mL Mouth Rinse BID  . apixaban  2.5 mg Oral BID  . carvedilol  20 mg Oral Daily  . dronabinol  2.5 mg Oral BID AC  . epoetin (EPOGEN/PROCRIT) injection  14,000 Units Subcutaneous Q M,W,F-HD  . feeding supplement (NEPRO CARB STEADY)  237 mL Oral BID BM  . fluconazole  200 mg Oral Once per day on Sun Tue Thu Sat  . fluconazole  400 mg Oral Once per day on Mon Wed Fri  . mometasone-formoterol  2 puff Inhalation BID  . pantoprazole  40 mg Oral BID  . potassium chloride  40 mEq Oral BID  . scopolamine  1 patch Transdermal Q72H  . tuberculin  5 Units Intradermal  Once   sodium chloride, sodium chloride, acetaminophen **OR** acetaminophen, albuterol, haloperidol lactate, heparin, lidocaine (PF), lidocaine-prilocaine, magnesium hydroxide, metoprolol, ondansetron (ZOFRAN) IV, promethazine, ziprasidone  Assessment/ Plan:  Mr. Rout is a 89 black male with progressive mantle cell lymphoma, RICE chemo in 11/2014, hx left  sided hydronephrosis and hydroureter. S/p ureteral stent placement   Hospital course complicated by sepsis with Klebsiella oxytoca, hypernatremia. Now with fungemia.   1. End Stage Renal Failure:  Acute renal failure due to ATN and now without recovery.  - next HD on Monday   2.  Sepsis with fungemia. A41.9, B49. Status post bacteremia with klebsiella oxytoca: completed two weeks for IV ceftazidime. Subsequently growing candida parapsilosis.  Subcutaneous port removed 01/02/15. - Appreciate ID input.  Currently on fluconazole for 4 weeks. End on 8/21.   3.  Anemia of chronic kidney disease/mantle cell lymphoma: hemoglobin 6.6.  - status post transfusion 1 unit PRBC on 7/27, 7/30, 8/3 - continue epogen 14000 units IV with HD, use cleared by hematology/onc.   4. Secondary Hyperparathyroidism: PTH 126. Phos 1.4  - not on binders. No indication for activated vitamin D    LOS: 39 Rubel Heckard 8/6/201611:34 AM

## 2015-01-18 NOTE — Plan of Care (Signed)
Problem: Discharge Progression Outcomes Goal: Other Discharge Outcomes/Goals Outcome: Progressing Pt has been alert but tries to get out of bed occasionally. Needs frequent repositioning and remains incontinent of bowel. Occasionally uses the urinal.

## 2015-01-19 MED ORDER — EPOETIN ALFA 10000 UNIT/ML IJ SOLN
10000.0000 [IU] | INTRAMUSCULAR | Status: DC
  Administered 2015-01-20 – 2015-01-24 (×3): 10000 [IU] via INTRAVENOUS

## 2015-01-19 NOTE — Plan of Care (Addendum)
Problem: Discharge Progression Outcomes Goal: Discharge plan in place and appropriate Outcome: Progressing 1. Discharge planners still coordinating discharge plans with pt condition limiting progress to discharge. 2.  Pt unable to tolerate any activity today other than bedrest. 3. Lab unable to obtain specimen for CBC with pt refusing CBC today with Dr. Anselm Jungling notifed with order changed to tomorrow. Pt refused lunch and only took sips of supplement. Generalized weakness, color pale, noted SOB without distress on exertion. Incontinent bladder/bowels. "I want to go to my bed"-reoriented to hospital. 4. Diet- refused lunch and dinner including some outside food that friend brought in/ only tooks sips of supplements. Reports no appetite. 5. Plan for HD tomorrow. BP running lower ranges of normal limits.

## 2015-01-19 NOTE — Progress Notes (Signed)
Subjective:   More awake this morning. No acute complaints. No shortness of breath..    Objective:  Vital signs in last 24 hours:  Temp:  [97.4 F (36.3 C)-98.3 F (36.8 C)] 97.6 F (36.4 C) (08/07 0929) Pulse Rate:  [82-95] 92 (08/07 0929) Resp:  [18-20] 18 (08/07 0929) BP: (94-105)/(42-59) 95/59 mmHg (08/07 0929) SpO2:  [93 %-100 %] 98 % (08/07 0929) Weight:  [68.811 kg (151 lb 11.2 oz)] 68.811 kg (151 lb 11.2 oz) (08/07 0500)  Weight change: 0.411 kg (14.5 oz) Filed Weights   01/17/15 0945 01/18/15 0500 01/19/15 0500  Weight: 68.4 kg (150 lb 12.7 oz) 69.128 kg (152 lb 6.4 oz) 68.811 kg (151 lb 11.2 oz)    Intake/Output: I/O last 3 completed shifts: In: 120 [P.O.:120] Out: 600 [Urine:600]   Intake/Output this shift:  Total I/O In: 120 [P.O.:120] Out: -   Physical Exam: General: chronically ill appearing  Head: oral mucosa moist  Eyes: Anicteric  Neck: Supple, trachea midline  Lungs:  Clear to auscultation normal effort  Heart: S1S2 no rubs  Abdomen:  Soft, BS present  Extremities: No LE edema  Neurologic: Awake, alert, oriented today  Skin: No acute rashes  Access: [facility] permcath  01/10/15 Dr. Lucky Cowboy    Basic Metabolic Panel:  Recent Labs Lab 01/13/15 0427 01/14/15 0427 01/15/15 0423 01/16/15 0821 01/17/15 0950  NA 138 139 138 138 136  K 3.3* 3.0* 2.6* 3.0* 3.5  CL 101 101 100* 99* 98*  CO2 25 28 26 27 26   GLUCOSE 91 98 120* 113* 90  BUN 41* 30* 34* 21* 28*  CREATININE 5.02* 3.93* 4.50* 3.09* 3.90*  CALCIUM 7.8* 7.8* 7.9* 7.8* 7.8*  MG 1.9  --  1.8  --   --   PHOS  --   --   --   --  1.4*    Liver Function Tests:  Recent Labs Lab 01/17/15 0950  ALBUMIN 1.9*   No results for input(s): LIPASE, AMYLASE in the last 168 hours. No results for input(s): AMMONIA in the last 168 hours.  CBC:  Recent Labs Lab 01/14/15 0427 01/15/15 0423 01/16/15 0821 01/17/15 0949 01/18/15 0543  WBC 14.7* 15.5* 17.5* 16.6* 13.0*  HGB 7.1* 6.5* 7.9* 7.6* 6.6*   HCT 21.7* 19.8* 23.7* 23.3* 19.3*  MCV 88.7 89.2 89.1 89.9 89.9  PLT 80* 73* 64* 55* 40*    Cardiac Enzymes: No results for input(s): CKTOTAL, CKMB, CKMBINDEX, TROPONINI in the last 168 hours.  BNP: Invalid input(s): POCBNP  CBG: No results for input(s): GLUCAP in the last 168 hours.  Microbiology: Results for orders placed or performed during the hospital encounter of 11/29/14  MRSA PCR Screening     Status: None   Collection Time: 11/29/14 12:13 PM  Result Value Ref Range Status   MRSA by PCR NEGATIVE NEGATIVE Final    Comment:        The GeneXpert MRSA Assay (FDA approved for NASAL specimens only), is one component of a comprehensive MRSA colonization surveillance program. It is not intended to diagnose MRSA infection nor to guide or monitor treatment for MRSA infections.   Culture, blood (routine x 2)     Status: None   Collection Time: 11/29/14  1:19 PM  Result Value Ref Range Status   Specimen Description BLOOD  Final   Special Requests Immunocompromised  Final   Culture NO GROWTH 5 DAYS  Final   Report Status 12/04/2014 FINAL  Final  Culture, blood (routine x 2)  Status: None   Collection Time: 11/29/14  3:04 PM  Result Value Ref Range Status   Specimen Description Blood  Final   Special Requests Immunocompromised  Final   Report Status 01/03/2015 FINAL  Final  Urine culture     Status: None   Collection Time: 11/30/14 10:12 AM  Result Value Ref Range Status   Specimen Description URINE, CATHETERIZED  Final   Special Requests Immunocompromised  Final   Culture NO GROWTH 2 DAYS  Final   Report Status 12/02/2014 FINAL  Final  Culture, blood (routine x 2)     Status: None   Collection Time: 12/06/14  8:08 PM  Result Value Ref Range Status   Specimen Description BLOOD  Final   Special Requests NONE  Final   Culture  Setup Time   Final    GRAM POSITIVE COCCI IN BOTH AEROBIC AND ANAEROBIC BOTTLES CRITICAL RESULT CALLED TO, READ BACK BY AND VERIFIED  WITH: JENNIFER BEZARD AT 6063 12/08/14.[facility] CONFIRMED BY [facility]    Culture   Final    STAPHYLOCOCCUS AURICULARIS IN BOTH AEROBIC AND ANAEROBIC BOTTLES    Report Status 12/11/2014 FINAL  Final   Organism ID, Bacteria STAPHYLOCOCCUS AURICULARIS  Final      Susceptibility   Staphylococcus auricularis - MIC*    CIPROFLOXACIN >=8 RESISTANT Resistant     ERYTHROMYCIN >=8 RESISTANT Resistant     GENTAMICIN <=0.5 SENSITIVE Sensitive     OXACILLIN >=4 RESISTANT Resistant     TETRACYCLINE <=1 SENSITIVE Sensitive     VANCOMYCIN 1 SENSITIVE Sensitive     CLINDAMYCIN <=0.25 SENSITIVE Sensitive     TRIMETH/SULFA Value in next row Sensitive      SENSITIVE<=20    LEVOFLOXACIN Value in next row Intermediate      INTERMEDIATE4    * STAPHYLOCOCCUS AURICULARIS  Culture, blood (routine x 2)     Status: None   Collection Time: 12/06/14  8:40 PM  Result Value Ref Range Status   Specimen Description BLOOD  Final   Special Requests NONE  Final   Culture NO GROWTH 5 DAYS  Final   Report Status 12/11/2014 FINAL  Final  Culture, blood (routine x 2)     Status: None   Collection Time: 12/08/14 11:40 AM  Result Value Ref Range Status   Specimen Description BLOOD  Final   Special Requests Normal  Final   Culture NO GROWTH 6 DAYS  Final   Report Status 12/14/2014 FINAL  Final  Culture, blood (routine x 2)     Status: None   Collection Time: 12/08/14 11:49 AM  Result Value Ref Range Status   Specimen Description BLOOD  Final   Special Requests Normal  Final   Culture NO GROWTH 6 DAYS  Final   Report Status 12/14/2014 FINAL  Final  C difficile quick scan w PCR reflex ([facility] only)     Status: None   Collection Time: 12/14/14 10:11 AM  Result Value Ref Range Status   C Diff antigen NEGATIVE  Final   C Diff toxin NEGATIVE  Final   C Diff interpretation Negative for C. difficile  Final  Culture, blood (routine x 2)     Status: None   Collection Time: 12/27/14 11:28 AM  Result Value Ref Range Status    Specimen Description BLOOD  Final   Special Requests Immunocompromised  Final   Culture  Setup Time   Final    YEAST AEROBIC BOTTLE ONLY CRITICAL RESULT CALLED TO, READ BACK BY  AND VERIFIED WITH: CATHY SUMMERLIN ON 12/29/14 AT 1000AM BY JEF    Culture CANDIDA PARAPSILOSIS AEROBIC BOTTLE ONLY   Final   Report Status 01/02/2015 FINAL  Final  Culture, blood (routine x 2)     Status: None   Collection Time: 12/27/14 11:36 AM  Result Value Ref Range Status   Specimen Description BLOOD  Final   Special Requests Immunocompromised  Final   Culture  Setup Time   Final    YEAST AEROBIC BOTTLE ONLY CRITICAL RESULT CALLED TO, READ BACK BY AND VERIFIED WITH: LISA ROMERO AT 0111 ON 12/29/14 [facility] CONFIRMED BY [place]    Culture CANDIDA PARAPSILOSIS AEROBIC BOTTLE ONLY   Final   Report Status 01/02/2015 FINAL  Final  Cath Tip Culture     Status: None   Collection Time: 12/27/14  4:17 PM  Result Value Ref Range Status   Specimen Description CATH TIP  Final   Special Requests NONE  Final   Culture NO GROWTH 2 DAYS  Final   Report Status 12/30/2014 FINAL  Final  Culture, blood (routine x 2)     Status: None   Collection Time: 12/29/14 12:28 PM  Result Value Ref Range Status   Specimen Description BLOOD RIGHT ARM  Final   Special Requests   Final    BOTTLES DRAWN AEROBIC AND ANAEROBIC  AER 4CC ANA 3CC   Culture  Setup Time   Final    BUDDING YEAST SEEN AEROBIC BOTTLE ONLY CRITICAL RESULT CALLED TO, READ BACK BY AND VERIFIED WITH: BROKE ROBERTSON AT 2230 01/01/15.TSH CONFIRMED BY SDR    Culture CANDIDA PARAPSILOSIS AEROBIC BOTTLE ONLY   Final   Report Status 01/04/2015 FINAL  Final  Culture, blood (routine x 2)     Status: None   Collection Time: 12/29/14 12:28 PM  Result Value Ref Range Status   Specimen Description BLOOD RIGHT FATTY CASTS  Final   Special Requests BOTTLES DRAWN AEROBIC AND ANAEROBIC  5CC  Final   Culture  Setup Time   Final    YEAST AEROBIC BOTTLE ONLY CRITICAL VALUE  NOTED.  VALUE IS CONSISTENT WITH PREVIOUSLY REPORTED AND CALLED VALUE.    Culture CANDIDA PARAPSILOSIS  Final   Report Status 01/04/2015 FINAL  Final  Cath Tip Culture     Status: None   Collection Time: 01/02/15 12:42 PM  Result Value Ref Range Status   Specimen Description CATH TIP  Final   Special Requests Normal  Final   Culture NO GROWTH 3 DAYS  Final   Report Status 01/05/2015 FINAL  Final  Culture, blood (routine x 2)     Status: None   Collection Time: 01/02/15  6:34 PM  Result Value Ref Range Status   Specimen Description BLOOD RIGHT HAND  Final   Special Requests 5 BOTTLES DRAWN AEROBIC AND ANAEROBIC 5ML  Final   Culture  Setup Time   Final    BUDDING YEAST SEEN ANAEROBIC BOTTLE ONLY CRITICAL RESULT CALLED TO, READ BACK BY AND VERIFIED WITH: IRIS GUIDRY AT 2030 01/04/15 SDR  CONFIRMED BY RW    Culture CANDIDA PARAPSILOSIS  Final   Report Status 01/07/2015 FINAL  Final  Culture, blood (routine x 2)     Status: None   Collection Time: 01/05/15  8:59 AM  Result Value Ref Range Status   Specimen Description BLOOD RIGHT ASSIST CONTROL  Final   Special Requests BOTTLES DRAWN AEROBIC AND ANAEROBIC 1ML  Final   Culture NO GROWTH 5 DAYS  Final  Report Status 01/10/2015 FINAL  Final  Culture, blood (routine x 2)     Status: None   Collection Time: 01/05/15  9:10 AM  Result Value Ref Range Status   Specimen Description BLOOD LEFT HAND  Final   Special Requests BOTTLES DRAWN AEROBIC AND ANAEROBIC 1ML  Final   Culture NO GROWTH 5 DAYS  Final   Report Status 01/10/2015 FINAL  Final  Cath Tip Culture     Status: None   Collection Time: 01/05/15 11:30 AM  Result Value Ref Range Status   Specimen Description CATH TIP  Final   Special Requests NONE  Final   Culture PSEUDOMONAS AERUGINOSA KLEBSIELLA OXYTOCA   Final   Report Status 01/08/2015 FINAL  Final   Organism ID, Bacteria PSEUDOMONAS AERUGINOSA  Final   Organism ID, Bacteria KLEBSIELLA OXYTOCA  Final      Susceptibility    Klebsiella oxytoca - MIC*    AMPICILLIN >=32 RESISTANT Resistant     CEFTAZIDIME <=1 SENSITIVE Sensitive     CEFAZOLIN >=64 RESISTANT Resistant     CEFTRIAXONE 8 SENSITIVE Sensitive     CIPROFLOXACIN <=0.25 SENSITIVE Sensitive     GENTAMICIN <=1 SENSITIVE Sensitive     IMIPENEM <=0.25 SENSITIVE Sensitive     TRIMETH/SULFA <=20 SENSITIVE Sensitive     PIP/TAZO Value in next row Resistant      RESISTANT>=128    * KLEBSIELLA OXYTOCA   Pseudomonas aeruginosa - MIC*    CEFTAZIDIME Value in next row Sensitive      RESISTANT>=128    CIPROFLOXACIN Value in next row Resistant      RESISTANT>=128    GENTAMICIN Value in next row Sensitive      RESISTANT>=128    IMIPENEM Value in next row Resistant      RESISTANT>=128    PIP/TAZO Value in next row Sensitive      SENSITIVE8    * PSEUDOMONAS AERUGINOSA    Coagulation Studies: No results for input(s): LABPROT, INR in the last 72 hours.  Urinalysis: No results for input(s): COLORURINE, LABSPEC, PHURINE, GLUCOSEU, HGBUR, BILIRUBINUR, KETONESUR, PROTEINUR, UROBILINOGEN, NITRITE, LEUKOCYTESUR in the last 72 hours.  Invalid input(s): APPERANCEUR    Imaging: No results found.   Medications:     . amiodarone  200 mg Oral Daily  . antiseptic oral rinse  7 mL Mouth Rinse BID  . apixaban  2.5 mg Oral BID  . carvedilol  20 mg Oral Daily  . dronabinol  2.5 mg Oral BID AC  . epoetin (EPOGEN/PROCRIT) injection  14,000 Units Subcutaneous Q M,W,F-HD  . feeding supplement (NEPRO CARB STEADY)  237 mL Oral BID BM  . fluconazole  200 mg Oral Once per day on Sun Tue Thu Sat  . fluconazole  400 mg Oral Once per day on Mon Wed Fri  . mometasone-formoterol  2 puff Inhalation BID  . pantoprazole  40 mg Oral BID  . potassium chloride  40 mEq Oral BID  . scopolamine  1 patch Transdermal Q72H  . tuberculin  5 Units Intradermal Once   sodium chloride, sodium chloride, acetaminophen **OR** acetaminophen, albuterol, haloperidol lactate, heparin,  lidocaine (PF), lidocaine-prilocaine, magnesium hydroxide, metoprolol, ondansetron (ZOFRAN) IV, promethazine, ziprasidone  Assessment/ Plan:  Caleb Francis is a 64 black male with progressive mantle cell lymphoma, RICE chemo in 11/2014, hx left sided hydronephrosis and hydroureter. S/p ureteral stent placement   Hospital course complicated by sepsis with Klebsiella oxytoca, hypernatremia. Now with fungemia.   1. End Stage Renal Failure:  Acute  renal failure due to ATN and now without recovery.  - next HD on Monday   2.  Sepsis with fungemia. A41.9, B49. Status post bacteremia with klebsiella oxytoca: completed two weeks for IV ceftazidime. Subsequently growing candida parapsilosis.  Subcutaneous port removed 01/02/15. - Appreciate ID input.  Currently on fluconazole for 4 weeks. End on 8/21.   3.  Anemia of chronic kidney disease/mantle cell lymphoma: hemoglobin 6.6.  - status post transfusion 1 unit PRBC on 7/27, 7/30, 8/3 - continue epogen 10,000 units IV with HD, use cleared by hematology/onc.  - Continue to monitor hemoglobin closely. Transfuse as necessary. - Also noted to have thrombocytopenia  4. Secondary Hyperparathyroidism: PTH 126. Phos 1.4  - not on binders. No indication for activated vitamin D    LOS: 51 Caleb Francis 8/7/201611:28 AM

## 2015-01-19 NOTE — Progress Notes (Signed)
Left IJ dialysis catheter at left upper chest with dressing dry/intact with area normal in appearance.

## 2015-01-19 NOTE — Progress Notes (Signed)
[facility] [place] NAME: Caleb Francis    MR#:  [id number]  DATE OF BIRTH:  06/23/1945  SUBJECTIVE:  Still poor oral intake.  No complains. HB droped ,    He refused fro blood work today. REVIEW OF SYSTEMS:   Review of Systems  Constitutional: Positive for malaise/fatigue. Negative for fever, chills and weight loss.  HENT: Negative for ear discharge, ear pain and nosebleeds.   Eyes: Negative for blurred vision, pain and discharge.  Respiratory: Negative for sputum production, shortness of breath, wheezing and stridor.   Cardiovascular: Negative for chest pain, palpitations, orthopnea and PND.  Gastrointestinal: Negative for nausea, vomiting, abdominal pain and diarrhea.  Genitourinary: Negative for urgency and frequency.  Musculoskeletal: Negative for back pain and joint pain.  Neurological: Positive for weakness. Negative for sensory change, speech change and focal weakness.  Psychiatric/Behavioral: Negative for depression. The patient is not nervous/anxious.    Tolerating Diet:very little Tolerating PT: eval pending   DRUG ALLERGIES:  No Known Allergies VITALS:  Blood pressure 96/55, pulse 93, temperature 97.6 F (36.4 C), temperature source Oral, resp. rate 20, height 6' (1.829 m), weight 68.811 kg (151 lb 11.2 oz), SpO2 100 %. PHYSICAL EXAMINATION:   Physical Exam GENERAL:  69 y.o.-year-old patient lying in the bed with no acute distress. Appears chronically ill and the lethargic. Sitting in the chair during dialysis just now. EYES: Pupils equal, round, reactive to light and accommodation. No scleral icterus. Extraocular muscles intact.  HEENT: Head atraumatic, normocephalic. Oropharynx and nasopharynx clear.  NECK:  Supple, no jugular venous distention. No thyroid enlargement, no tenderness.  LUNGS: Normal breath sounds bilaterally, no wheezing, rales, rhonchi. No use of accessory muscles of respiration.   CARDIOVASCULAR: S1, S2 normal. No murmurs, rubs, or gallops.  ABDOMEN: Soft, nontender, mild distended. Bowel sounds present. No organomegaly or mass.  EXTREMITIES: No cyanosis, clubbing or edema b/l.  NEUROLOGIC: Cranial nerves II through XII are intact. No focal Motor or sensory deficits b/l.  Weak,severe deconditioning PSYCHIATRIC:  patient is alert and oriented x 3.  SKIN: stage II sacral ulcer.  LABORATORY PANEL:   CBC  Recent Labs Lab 01/18/15 0543  WBC 13.0*  HGB 6.6*  HCT 19.3*  PLT 40*    Chemistries   Recent Labs Lab 01/15/15 0423  01/17/15 0950  NA 138  < > 136  K 2.6*  < > 3.5  CL 100*  < > 98*  CO2 26  < > 26  GLUCOSE 120*  < > 90  BUN 34*  < > 28*  CREATININE 4.50*  < > 3.90*  CALCIUM 7.9*  < > 7.8*  MG 1.8  --   --   < > = values in this interval not displayed. ASSESSMENT AND PLAN:   #1 sepsis: Fungemia with Candida parapsilosis blood cultures 7/15, and again on repeat culture 7/17 + and 7/21 + Appreciate infectious disease consultation.  continue PO fluconazole 400 mg PO for total 4 weeks from first negative( 01/05/15) bcx per Dr. Ola Spurr.  * Leukocytosis. Worse, follow-up CBC.  - Port-A-Cath removed on 7/21 by Dr. Lucky Cowboy. Cath tip growing Klebsiella and Pseudomonas (resulted on 7/27) which is likely not significant per ID so not recommending any abx for same unless clinical condition changes and blood c/s turns +  #2 acute renal failure due to ATN,  progressed to ESRD s/p permcath on 7/29, continue HD on M/W/F per Dr. Juleen China.  Waiting for placement arrangement  by CSW.  * Anemia of chronic disease. Hb down 6.5 and up to 7.9 after 1 unit PRBC transfusion.  no active bleeding,  s/p total 6 unit of PRBC, f/u Hb in am.  continue epogen 14000 units IV with HD.  Hb low today again- will follow.  #3 Mantle cell lymphoma -recvd Neupogen, Prognosis is poor.-followed by Dr Mike Gip -at present given overall pt's decline and varioius infections not sure if  pt is even a candidate for further Chemotherapy - further mgmt per Onco   #4 Chronic ileus with h/o GI bleeding: Clinically improving. No further bleeding. NG tube and rectal tube discontinued 7/12 - Continue PPI, high risk for further bleeding due to thrombocytopenia - Last seen by GI on July 4. No further plans for endoscopy. - tolerating diet now.  #5 deconditioning: Unable to participate in physical therapy due to poor motivation and severe deconditioning. PT to see if he improves  #6 Severe protein calorie malnutrition: Continue Marinol.  Dr. Manuella Ghazi discussed with family and the patient they would not be interested in PEG tube placement. pt's calorie count now stopped due to severe poor po intake. Encourage intake.  #7 encephalopathy: Appreciate neurology consultation. This is likely encephalopathy/delirium due to critical illness. Agree with when necessary Geodon for agitation. Has not been agitated  improved to baseline.  #8 pressure ulcers: Wound care.  #9 left basilic vein superficial extensive thrombosis On eliquis 2.5 mg bid and repeat US upper extremity shows Persistent superficial venous thrombosis noted in the basilic vein.  * Hypokalemia. Improved. K 3.5 today, f/u BMP.   CODE STATUS: DO NOT RESUSCITATE  Case discussed with Care Management/Social Worker. Management plans discussed with the patient,   Physical therapy was not able to sit up for greater than one minute on the side of the bed without max assist. He could to sit up for more than one hour during dialysis just now. If patient can continue to sit up during dialysis, he will be discharged to skilled nursing facility this coming week. Will be a very challenging case with long term stay and difficult discharge.  PT evaluation suggested skilled nursing facility placement. Per CM, may need out of state placement.  TOTAL TIME TAKING CARE OF THIS PATIENT: 38 minutes.   More than 50% of the time was spent in  counseling/coordination of care.   Vaughan Basta M.D on 01/19/2015 at 2:20 PM  Between 7am to 6pm - Pager - [phone]  After 6pm go to [email or web address] - password [email or web address] [facility]  [facility]  Office  [phone]  CC: Primary care physician; No PCP Per Patient

## 2015-01-19 NOTE — Plan of Care (Signed)
Problem: Discharge Progression Outcomes Goal: Other Discharge Outcomes/Goals Outcome: Progressing Plan of care progress to goal:  Poor PO intake still.   C/o pain on left abdomen, tylenol given PRN for pain.   BP runs low. MD aware.

## 2015-01-20 LAB — BASIC METABOLIC PANEL
ANION GAP: 11 (ref 5–15)
BUN: 35 mg/dL — AB (ref 6–20)
CALCIUM: 8.2 mg/dL — AB (ref 8.9–10.3)
CHLORIDE: 103 mmol/L (ref 101–111)
CO2: 24 mmol/L (ref 22–32)
CREATININE: 4.35 mg/dL — AB (ref 0.61–1.24)
GFR calc Af Amer: 15 mL/min — ABNORMAL LOW (ref >60)
GFR calc non Af Amer: 13 mL/min — ABNORMAL LOW (ref >60)
Glucose, Bld: 82 mg/dL (ref 65–99)
Potassium: 6.1 mmol/L — ABNORMAL HIGH (ref 3.5–5.1)
SODIUM: 138 mmol/L (ref 135–145)

## 2015-01-20 LAB — CBC
HCT: 19.7 % — ABNORMAL LOW (ref 40.0–52.0)
HEMOGLOBIN: 6.5 g/dL — AB (ref 13.0–18.0)
MCH: 30.2 pg (ref 26.0–34.0)
MCHC: 32.9 g/dL (ref 32.0–36.0)
MCV: 91.7 fL (ref 80.0–100.0)
Platelets: 31 10*3/uL — ABNORMAL LOW (ref 150–440)
RBC: 2.15 MIL/uL — AB (ref 4.40–5.90)
RDW: 17.4 % — AB (ref 11.5–14.5)
WBC: 13.2 10*3/uL — ABNORMAL HIGH (ref 3.8–10.6)

## 2015-01-20 LAB — PREPARE RBC (CROSSMATCH)

## 2015-01-20 MED ORDER — SODIUM CHLORIDE 0.9 % IV SOLN
Freq: Once | INTRAVENOUS | Status: AC
  Administered 2015-01-20: 11:00:00 via INTRAVENOUS

## 2015-01-20 MED ORDER — HEPARIN SODIUM (PORCINE) 1000 UNIT/ML IJ SOLN
5000.0000 [IU] | Freq: Once | INTRAMUSCULAR | Status: AC
  Administered 2015-01-20: 1000 [IU]

## 2015-01-20 MED ORDER — SODIUM CHLORIDE 0.9 % IJ SOLN
3.0000 mL | Freq: Two times a day (BID) | INTRAMUSCULAR | Status: DC
  Administered 2015-01-20 – 2015-01-24 (×8): 3 mL via INTRAVENOUS

## 2015-01-20 NOTE — Plan of Care (Signed)
Problem: Discharge Progression Outcomes Goal: Other Discharge Outcomes/Goals Outcome: Progressing Plan of care progress to goal: patient admitted with lymphoma, He is a DNR,   Poor appetite, takes medications whole with water.  Patient with left perm cath for dialysis intact Hgb low, recheck in am.  Patient is incontinent of bowel and bladder, confused at times.  Patient without complaints of pain this shift.

## 2015-01-20 NOTE — Progress Notes (Signed)
[facility] [place] NAME: Caleb Francis    MR#:  [id number]  DATE OF BIRTH:  November 28, 1945  SUBJECTIVE:  Still poor oral intake.  No complains. HB droped ,    He refused fro blood work , today have HD.  HB is lower and platelets are dropping fuirther.   REVIEW OF SYSTEMS:   Review of Systems  Constitutional: Positive for malaise/fatigue. Negative for fever, chills and weight loss.  HENT: Negative for ear discharge, ear pain and nosebleeds.   Eyes: Negative for blurred vision, pain and discharge.  Respiratory: Negative for sputum production, shortness of breath, wheezing and stridor.   Cardiovascular: Negative for chest pain, palpitations, orthopnea and PND.  Gastrointestinal: Negative for nausea, vomiting, abdominal pain and diarrhea.  Genitourinary: Negative for urgency and frequency.  Musculoskeletal: Negative for back pain and joint pain.  Neurological: Positive for weakness. Negative for sensory change, speech change and focal weakness.  Psychiatric/Behavioral: Negative for depression. The patient is not nervous/anxious.    Tolerating Diet:very little Tolerating PT: eval pending   DRUG ALLERGIES:  No Known Allergies VITALS:  Blood pressure 93/46, pulse 81, temperature 98.1 F (36.7 C), temperature source Oral, resp. rate 18, height 6' (1.829 m), weight 68.675 kg (151 lb 6.4 oz), SpO2 100 %. PHYSICAL EXAMINATION:   Physical Exam GENERAL:  69 y.o.-year-old patient lying in the bed with no acute distress. Appears chronically ill and the lethargic. Sitting in the chair during dialysis just now. EYES: Pupils equal, round, reactive to light and accommodation. No scleral icterus. Extraocular muscles intact. Conjunctiva pale. HEENT: Head atraumatic, normocephalic. Oropharynx and nasopharynx clear.  NECK:  Supple, no jugular venous distention. No thyroid enlargement, no tenderness.  LUNGS: Normal breath sounds bilaterally, no  wheezing, rales, rhonchi. No use of accessory muscles of respiration.  CARDIOVASCULAR: S1, S2 normal. No murmurs, rubs, or gallops.  ABDOMEN: Soft, nontender, mild distended. Bowel sounds present. No organomegaly or mass.  EXTREMITIES: No cyanosis, clubbing or edema b/l.  NEUROLOGIC: Cranial nerves II through XII are intact. No focal Motor or sensory deficits b/l.  Weak,severe deconditioning PSYCHIATRIC:  patient is alert and oriented x 3.  SKIN: stage II sacral ulcer.  LABORATORY PANEL:   CBC  Recent Labs Lab 01/20/15 0826  WBC 13.2*  HGB 6.5*  HCT 19.7*  PLT 31*    Chemistries   Recent Labs Lab 01/15/15 0423  01/20/15 0826  NA 138  < > 138  K 2.6*  < > 6.1*  CL 100*  < > 103  CO2 26  < > 24  GLUCOSE 120*  < > 82  BUN 34*  < > 35*  CREATININE 4.50*  < > 4.35*  CALCIUM 7.9*  < > 8.2*  MG 1.8  --   --   < > = values in this interval not displayed. ASSESSMENT AND PLAN:   * sepsis: Fungemia with Candida parapsilosis blood cultures 7/15, and again on repeat culture 7/17 + and 7/21 + Appreciate infectious disease consultation.  continue PO fluconazole 400 mg PO for total 4 weeks from first negative( 01/05/15) bcx per Dr. Ola Spurr.  * Leukocytosis. Worse, follow-up CBC.  - Port-A-Cath removed on 7/21 by Dr. Lucky Cowboy. Cath tip growing Klebsiella and Pseudomonas (resulted on 7/27) which is likely not significant per ID so not recommending any abx for same unless clinical condition changes and blood c/s turns +  * acute renal failure due to ATN,  progressed to ESRD s/p  permcath on 7/29, continue HD on M/W/F per Dr. Juleen China.  Waiting for placement arrangement by CSW.  * Anemia of chronic disease. Hb down 6.5 and up to 7.9 after 1 unit PRBC transfusion.  no active bleeding,  s/p total 6 unit of PRBC,  continue epogen 14000 units IV with HD.  Hb low again on 01/20/15- transfuse 2 units.    * thrombocytopenia   As also have Low Hb   Called hematology consult.  * Mantle cell  lymphoma -recvd Neupogen, Prognosis is poor.-followed by Dr Mike Gip -at present given overall pt's decline and varioius infections not sure if pt is even a candidate for further Chemotherapy - further mgmt per Onco   * Chronic ileus with h/o GI bleeding: Clinically improving. No further bleeding. NG tube and rectal tube discontinued 7/12 - Continue PPI, high risk for further bleeding due to thrombocytopenia - Last seen by GI on July 4. No further plans for endoscopy. - tolerating diet now.  * deconditioning: Unable to participate in physical therapy due to poor motivation and severe deconditioning. PT to see if he improves  * Severe protein calorie malnutrition: Continue Marinol.  Dr. Manuella Ghazi discussed with family and the patient they would not be interested in PEG tube placement. pt's calorie count now stopped due to severe poor po intake. Encourage intake.  * encephalopathy: Appreciate neurology consultation. This is likely encephalopathy/delirium due to critical illness. Agree with when necessary Geodon for agitation. Has not been agitated  improved to baseline.  * pressure ulcers: Wound care.  * left basilic vein superficial extensive thrombosis On eliquis 2.5 mg bid and repeat US upper extremity shows Persistent superficial venous thrombosis noted in the basilic vein.  * Hypokalemia. Improved. K 3.5 today, f/u BMP.   CODE STATUS: DO NOT RESUSCITATE  Case discussed with Care Management/Social Worker. Management plans discussed with the patient,   Physical therapy was not able to sit up for greater than one minute on the side of the bed without max assist. He could to sit up for more than one hour during dialysis just now. If patient can continue to sit up during dialysis, he will be discharged to skilled nursing facility this coming week. Will be a very challenging case with long term stay and difficult discharge.  PT evaluation suggested skilled nursing facility placement. Per  CM, may need out of state placement.  TOTAL TIME TAKING CARE OF THIS PATIENT: 38 minutes.   More than 50% of the time was spent in counseling/coordination of care.   Vaughan Basta M.D on 01/20/2015 at 9:14 AM  Between 7am to 6pm - Pager - [phone]  After 6pm go to [email or web address] - password [email or web address]  [facility]  Office  [phone]  CC: Primary care physician; No PCP Per Patient

## 2015-01-20 NOTE — Clinical Social Work Note (Addendum)
Clinical Education officer, museum received report from covering [place]. Pt will discharge to [facility] when stable. Pt is able to sit for an hour for HD. Per Dialysis Liaision, she is working on establishing pt at an outpatient HD center locally. There is no confirmation at time of note. CSW will continue to follow.   Darden Dates, MSW, LCSW Clinical Social Worker  [phone]  Addendum: CSW was updated by [facility] Pt was able to sit up for HD today and could be ready for discharge tomorrow. CSW left a voicemail for [name], Dialysis Liaison as well as responded to her daily email with this update. CSW will continue to follow.  Darden Dates, MSW, LCSW

## 2015-01-20 NOTE — Progress Notes (Signed)
PT Cancellation Note  Patient Details Name: JOSHUAJAMES MOEHRING MRN: [id number] DOB: 1946/04/25   Cancelled Treatment:    Reason Eval/Treat Not Completed: Patient declined, no reason specified. Spoke with nursing prior to attempting treatment, as patient's potassium level critically high on last lab from this a.m; nursing notes PT may try although pt just returned from dialysis and has not been participating much. Pt did refuse all treatment offers. Re attempt treatment at a later time this week.    Erline Levine Bishop 01/20/2015, 3:22 PM

## 2015-01-20 NOTE — Plan of Care (Addendum)
Problem: Discharge Progression Outcomes Goal: Other Discharge Outcomes/Goals Outcome: Progressing Plan of care progress to goal: Afebrile. No c/o pain. Pt continues to not eat well at all. Had dialysis today, received 2 units pRBCs for low HGB during dialysis. BP low during dialysis treatment, no fluid removed, but patient received about 763ml of blood per Eduard Clos, RN in dialysis. Plan to d/c to [facility].

## 2015-01-20 NOTE — Progress Notes (Signed)
Subjective:   Patient seen prior to dialysis. No acute complaints. He has been seated in a chair for dialysis. His hemoglobin has drifted down again to 6.5 despite recent transfusion. His platelet count is also low at 31 Potassium level was noted to be critically high today.   Objective:  Vital signs in last 24 hours:  Temp:  [97.6 F (36.4 C)-98.6 F (37 C)] 98.1 F (36.7 C) (08/08 0717) Pulse Rate:  [63-93] 81 (08/08 0717) Resp:  [18-24] 18 (08/08 0456) BP: (81-96)/(46-56) 93/46 mmHg (08/08 0717) SpO2:  [88 %-100 %] 100 % (08/08 0717) Weight:  [68.675 kg (151 lb 6.4 oz)] 68.675 kg (151 lb 6.4 oz) (08/08 0500)  Weight change: -0.136 kg (-4.8 oz) Filed Weights   01/18/15 0500 01/19/15 0500 01/20/15 0500  Weight: 69.128 kg (152 lb 6.4 oz) 68.811 kg (151 lb 11.2 oz) 68.675 kg (151 lb 6.4 oz)    Intake/Output: I/O last 3 completed shifts: In: 210 [P.O.:210] Out: 103 [Urine:101; Stool:2]   Intake/Output this shift:     Physical Exam: General: chronically ill appearing  Head: oral mucosa moist  Eyes: Anicteric  Neck: Supple, trachea midline  Lungs:  Clear to auscultation normal effort  Heart: S1S2 no rubs  Abdomen:  Soft, BS present  Extremities: No LE edema  Neurologic: Awake, alert, oriented today  Skin: No acute rashes  Access: [facility] permcath  01/10/15 Dr. Lucky Cowboy    Basic Metabolic Panel:  Recent Labs Lab 01/14/15 0427 01/15/15 0423 01/16/15 6256 01/17/15 0950 01/20/15 0826  NA 139 138 138 136 138  K 3.0* 2.6* 3.0* 3.5 6.1*  CL 101 100* 99* 98* 103  CO2 28 26 27 26 24   GLUCOSE 98 120* 113* 90 82  BUN 30* 34* 21* 28* 35*  CREATININE 3.93* 4.50* 3.09* 3.90* 4.35*  CALCIUM 7.8* 7.9* 7.8* 7.8* 8.2*  MG  --  1.8  --   --   --   PHOS  --   --   --  1.4*  --     Liver Function Tests:  Recent Labs Lab 01/17/15 0950  ALBUMIN 1.9*   No results for input(s): LIPASE, AMYLASE in the last 168 hours. No results for input(s): AMMONIA in the last 168  hours.  CBC:  Recent Labs Lab 01/15/15 0423 01/16/15 0821 01/17/15 0949 01/18/15 0543 01/20/15 0826  WBC 15.5* 17.5* 16.6* 13.0* 13.2*  HGB 6.5* 7.9* 7.6* 6.6* 6.5*  HCT 19.8* 23.7* 23.3* 19.3* 19.7*  MCV 89.2 89.1 89.9 89.9 91.7  PLT 73* 64* 55* 40* 31*    Cardiac Enzymes: No results for input(s): CKTOTAL, CKMB, CKMBINDEX, TROPONINI in the last 168 hours.  BNP: Invalid input(s): POCBNP  CBG: No results for input(s): GLUCAP in the last 168 hours.  Microbiology: Results for orders placed or performed during the hospital encounter of 11/29/14  MRSA PCR Screening     Status: None   Collection Time: 11/29/14 12:13 PM  Result Value Ref Range Status   MRSA by PCR NEGATIVE NEGATIVE Final    Comment:        The GeneXpert MRSA Assay (FDA approved for NASAL specimens only), is one component of a comprehensive MRSA colonization surveillance program. It is not intended to diagnose MRSA infection nor to guide or monitor treatment for MRSA infections.   Culture, blood (routine x 2)     Status: None   Collection Time: 11/29/14  1:19 PM  Result Value Ref Range Status   Specimen Description BLOOD  Final  Special Requests Immunocompromised  Final   Culture NO GROWTH 5 DAYS  Final   Report Status 12/04/2014 FINAL  Final  Culture, blood (routine x 2)     Status: None   Collection Time: 11/29/14  3:04 PM  Result Value Ref Range Status   Specimen Description Blood  Final   Special Requests Immunocompromised  Final   Report Status 01/03/2015 FINAL  Final  Urine culture     Status: None   Collection Time: 11/30/14 10:12 AM  Result Value Ref Range Status   Specimen Description URINE, CATHETERIZED  Final   Special Requests Immunocompromised  Final   Culture NO GROWTH 2 DAYS  Final   Report Status 12/02/2014 FINAL  Final  Culture, blood (routine x 2)     Status: None   Collection Time: 12/06/14  8:08 PM  Result Value Ref Range Status   Specimen Description BLOOD  Final    Special Requests NONE  Final   Culture  Setup Time   Final    GRAM POSITIVE COCCI IN BOTH AEROBIC AND ANAEROBIC BOTTLES CRITICAL RESULT CALLED TO, READ BACK BY AND VERIFIED WITH: JENNIFER BEZARD AT 1610 12/08/14.PMH CONFIRMED BY RWW    Culture   Final    STAPHYLOCOCCUS AURICULARIS IN BOTH AEROBIC AND ANAEROBIC BOTTLES    Report Status 12/11/2014 FINAL  Final   Organism ID, Bacteria STAPHYLOCOCCUS AURICULARIS  Final      Susceptibility   Staphylococcus auricularis - MIC*    CIPROFLOXACIN >=8 RESISTANT Resistant     ERYTHROMYCIN >=8 RESISTANT Resistant     GENTAMICIN <=0.5 SENSITIVE Sensitive     OXACILLIN >=4 RESISTANT Resistant     TETRACYCLINE <=1 SENSITIVE Sensitive     VANCOMYCIN 1 SENSITIVE Sensitive     CLINDAMYCIN <=0.25 SENSITIVE Sensitive     TRIMETH/SULFA Value in next row Sensitive      SENSITIVE<=20    LEVOFLOXACIN Value in next row Intermediate      INTERMEDIATE4    * STAPHYLOCOCCUS AURICULARIS  Culture, blood (routine x 2)     Status: None   Collection Time: 12/06/14  8:40 PM  Result Value Ref Range Status   Specimen Description BLOOD  Final   Special Requests NONE  Final   Culture NO GROWTH 5 DAYS  Final   Report Status 12/11/2014 FINAL  Final  Culture, blood (routine x 2)     Status: None   Collection Time: 12/08/14 11:40 AM  Result Value Ref Range Status   Specimen Description BLOOD  Final   Special Requests Normal  Final   Culture NO GROWTH 6 DAYS  Final   Report Status 12/14/2014 FINAL  Final  Culture, blood (routine x 2)     Status: None   Collection Time: 12/08/14 11:49 AM  Result Value Ref Range Status   Specimen Description BLOOD  Final   Special Requests Normal  Final   Culture NO GROWTH 6 DAYS  Final   Report Status 12/14/2014 FINAL  Final  C difficile quick scan w PCR reflex ([facility] only)     Status: None   Collection Time: 12/14/14 10:11 AM  Result Value Ref Range Status   C Diff antigen NEGATIVE  Final   C Diff toxin NEGATIVE  Final   C  Diff interpretation Negative for C. difficile  Final  Culture, blood (routine x 2)     Status: None   Collection Time: 12/27/14 11:28 AM  Result Value Ref Range Status   Specimen Description BLOOD  Final   Special Requests Immunocompromised  Final   Culture  Setup Time   Final    YEAST AEROBIC BOTTLE ONLY CRITICAL RESULT CALLED TO, READ BACK BY AND VERIFIED WITH: CATHY SUMMERLIN ON 12/29/14 AT 1000AM BY JEF    Culture CANDIDA PARAPSILOSIS AEROBIC BOTTLE ONLY   Final   Report Status 01/02/2015 FINAL  Final  Culture, blood (routine x 2)     Status: None   Collection Time: 12/27/14 11:36 AM  Result Value Ref Range Status   Specimen Description BLOOD  Final   Special Requests Immunocompromised  Final   Culture  Setup Time   Final    YEAST AEROBIC BOTTLE ONLY CRITICAL RESULT CALLED TO, READ BACK BY AND VERIFIED WITH: LISA ROMERO AT 0111 ON 12/29/14 RWW CONFIRMED BY [place]    Culture CANDIDA PARAPSILOSIS AEROBIC BOTTLE ONLY   Final   Report Status 01/02/2015 FINAL  Final  Cath Tip Culture     Status: None   Collection Time: 12/27/14  4:17 PM  Result Value Ref Range Status   Specimen Description CATH TIP  Final   Special Requests NONE  Final   Culture NO GROWTH 2 DAYS  Final   Report Status 12/30/2014 FINAL  Final  Culture, blood (routine x 2)     Status: None   Collection Time: 12/29/14 12:28 PM  Result Value Ref Range Status   Specimen Description BLOOD RIGHT ARM  Final   Special Requests   Final    BOTTLES DRAWN AEROBIC AND ANAEROBIC  AER 4CC ANA 3CC   Culture  Setup Time   Final    BUDDING YEAST SEEN AEROBIC BOTTLE ONLY CRITICAL RESULT CALLED TO, READ BACK BY AND VERIFIED WITH: BROKE ROBERTSON AT 2230 01/01/15.TSH CONFIRMED BY SDR    Culture CANDIDA PARAPSILOSIS AEROBIC BOTTLE ONLY   Final   Report Status 01/04/2015 FINAL  Final  Culture, blood (routine x 2)     Status: None   Collection Time: 12/29/14 12:28 PM  Result Value Ref Range Status   Specimen Description BLOOD  RIGHT FATTY CASTS  Final   Special Requests BOTTLES DRAWN AEROBIC AND ANAEROBIC  5CC  Final   Culture  Setup Time   Final    YEAST AEROBIC BOTTLE ONLY CRITICAL VALUE NOTED.  VALUE IS CONSISTENT WITH PREVIOUSLY REPORTED AND CALLED VALUE.    Culture CANDIDA PARAPSILOSIS  Final   Report Status 01/04/2015 FINAL  Final  Cath Tip Culture     Status: None   Collection Time: 01/02/15 12:42 PM  Result Value Ref Range Status   Specimen Description CATH TIP  Final   Special Requests Normal  Final   Culture NO GROWTH 3 DAYS  Final   Report Status 01/05/2015 FINAL  Final  Culture, blood (routine x 2)     Status: None   Collection Time: 01/02/15  6:34 PM  Result Value Ref Range Status   Specimen Description BLOOD RIGHT HAND  Final   Special Requests 5 BOTTLES DRAWN AEROBIC AND ANAEROBIC 5ML  Final   Culture  Setup Time   Final    BUDDING YEAST SEEN ANAEROBIC BOTTLE ONLY CRITICAL RESULT CALLED TO, READ BACK BY AND VERIFIED WITH: IRIS GUIDRY AT 2030 01/04/15 SDR  CONFIRMED BY RW    Culture CANDIDA PARAPSILOSIS  Final   Report Status 01/07/2015 FINAL  Final  Culture, blood (routine x 2)     Status: None   Collection Time: 01/05/15  8:59 AM  Result Value Ref Range Status  Specimen Description BLOOD RIGHT ASSIST CONTROL  Final   Special Requests BOTTLES DRAWN AEROBIC AND ANAEROBIC 1ML  Final   Culture NO GROWTH 5 DAYS  Final   Report Status 01/10/2015 FINAL  Final  Culture, blood (routine x 2)     Status: None   Collection Time: 01/05/15  9:10 AM  Result Value Ref Range Status   Specimen Description BLOOD LEFT HAND  Final   Special Requests BOTTLES DRAWN AEROBIC AND ANAEROBIC 1ML  Final   Culture NO GROWTH 5 DAYS  Final   Report Status 01/10/2015 FINAL  Final  Cath Tip Culture     Status: None   Collection Time: 01/05/15 11:30 AM  Result Value Ref Range Status   Specimen Description CATH TIP  Final   Special Requests NONE  Final   Culture PSEUDOMONAS AERUGINOSA KLEBSIELLA OXYTOCA    Final   Report Status 01/08/2015 FINAL  Final   Organism ID, Bacteria PSEUDOMONAS AERUGINOSA  Final   Organism ID, Bacteria KLEBSIELLA OXYTOCA  Final      Susceptibility   Klebsiella oxytoca - MIC*    AMPICILLIN >=32 RESISTANT Resistant     CEFTAZIDIME <=1 SENSITIVE Sensitive     CEFAZOLIN >=64 RESISTANT Resistant     CEFTRIAXONE 8 SENSITIVE Sensitive     CIPROFLOXACIN <=0.25 SENSITIVE Sensitive     GENTAMICIN <=1 SENSITIVE Sensitive     IMIPENEM <=0.25 SENSITIVE Sensitive     TRIMETH/SULFA <=20 SENSITIVE Sensitive     PIP/TAZO Value in next row Resistant      RESISTANT>=128    * KLEBSIELLA OXYTOCA   Pseudomonas aeruginosa - MIC*    CEFTAZIDIME Value in next row Sensitive      RESISTANT>=128    CIPROFLOXACIN Value in next row Resistant      RESISTANT>=128    GENTAMICIN Value in next row Sensitive      RESISTANT>=128    IMIPENEM Value in next row Resistant      RESISTANT>=128    PIP/TAZO Value in next row Sensitive      SENSITIVE8    * PSEUDOMONAS AERUGINOSA    Coagulation Studies: No results for input(s): LABPROT, INR in the last 72 hours.  Urinalysis: No results for input(s): COLORURINE, LABSPEC, PHURINE, GLUCOSEU, HGBUR, BILIRUBINUR, KETONESUR, PROTEINUR, UROBILINOGEN, NITRITE, LEUKOCYTESUR in the last 72 hours.  Invalid input(s): APPERANCEUR    Imaging: No results found.   Medications:     . sodium chloride   Intravenous Once  . amiodarone  200 mg Oral Daily  . antiseptic oral rinse  7 mL Mouth Rinse BID  . apixaban  2.5 mg Oral BID  . carvedilol  20 mg Oral Daily  . dronabinol  2.5 mg Oral BID AC  . epoetin (EPOGEN/PROCRIT) injection  10,000 Units Intravenous Q M,W,F-HD  . feeding supplement (NEPRO CARB STEADY)  237 mL Oral BID BM  . fluconazole  200 mg Oral Once per day on Sun Tue Thu Sat  . fluconazole  400 mg Oral Once per day on Mon Wed Fri  . mometasone-formoterol  2 puff Inhalation BID  . pantoprazole  40 mg Oral BID  . potassium chloride  40 mEq  Oral BID  . scopolamine  1 patch Transdermal Q72H   sodium chloride, sodium chloride, acetaminophen **OR** acetaminophen, albuterol, haloperidol lactate, heparin, lidocaine (PF), lidocaine-prilocaine, magnesium hydroxide, metoprolol, ondansetron (ZOFRAN) IV, promethazine, ziprasidone  Assessment/ Plan:  Caleb Francis is a 52 black male with progressive mantle cell lymphoma, RICE chemo in 11/2014, hx left  sided hydronephrosis and hydroureter. S/p ureteral stent placement   Hospital course complicated by sepsis with Klebsiella oxytoca, hypernatremia. Now with fungemia.   1. End Stage Renal Failure:  Acute renal failure due to ATN and now without recovery.  - next HD Today  - To be dialyzed in a chair  2.  Sepsis with fungemia. A41.9, B49. Status post bacteremia with klebsiella oxytoca: completed two weeks for IV ceftazidime. Subsequently growing candida parapsilosis.  Subcutaneous port removed 01/02/15. - Appreciate ID input.  Currently on fluconazole for 4 weeks. End on 8/21.   3.  Anemia of chronic kidney disease/mantle cell lymphoma: hemoglobin 6.6.  - status post transfusion 1 unit PRBC on 7/27, 7/30, 8/3 - continue epogen 10,000 units IV with HD, use cleared by hematology/onc.  - Continue to monitor hemoglobin closely. Transfuse as necessary. 2 units to be given at dialysis today - Also noted to have thrombocytopenia  4. Secondary Hyperparathyroidism: PTH 126. Phos 1.4  - not on binders. No indication for activated vitamin D    LOS: 52 Caleb Francis 8/8/201610:25 AM

## 2015-01-20 NOTE — Care Management Important Message (Signed)
Important Message  Patient Details  Name: Caleb Francis MRN: [id number] Date of Birth: 1945-10-24   Medicare Important Message Given:  Yes-third notification given    [name] 01/20/2015, 11:06 AM

## 2015-01-21 LAB — TYPE AND SCREEN
ABO/RH(D): O POS
Antibody Screen: NEGATIVE
UNIT DIVISION: 0
Unit division: 0

## 2015-01-21 LAB — CBC
HCT: 24.5 % — ABNORMAL LOW (ref 40.0–52.0)
HEMATOCRIT: 24.3 % — AB (ref 40.0–52.0)
HEMOGLOBIN: 8.1 g/dL — AB (ref 13.0–18.0)
Hemoglobin: 8 g/dL — ABNORMAL LOW (ref 13.0–18.0)
MCH: 29.5 pg (ref 26.0–34.0)
MCH: 29 pg (ref 26.0–34.0)
MCHC: 33.1 g/dL (ref 32.0–36.0)
MCHC: 32.7 g/dL (ref 32.0–36.0)
MCV: 89 fL (ref 80.0–100.0)
MCV: 88.7 fL (ref 80.0–100.0)
PLATELETS: 24 10*3/uL — AB (ref 150–440)
Platelets: 26 10*3/uL — CL (ref 150–440)
RBC: 2.73 MIL/uL — AB (ref 4.40–5.90)
RBC: 2.76 MIL/uL — ABNORMAL LOW (ref 4.40–5.90)
RDW: 18.4 % — ABNORMAL HIGH (ref 11.5–14.5)
RDW: 17.7 % — ABNORMAL HIGH (ref 11.5–14.5)
WBC: 11.3 10*3/uL — AB (ref 3.8–10.6)
WBC: 12.3 10*3/uL — ABNORMAL HIGH (ref 3.8–10.6)

## 2015-01-21 LAB — RENAL FUNCTION PANEL
Albumin: 1.9 g/dL — ABNORMAL LOW (ref 3.5–5.0)
Anion gap: 13 (ref 5–15)
BUN: 21 mg/dL — ABNORMAL HIGH (ref 6–20)
CHLORIDE: 96 mmol/L — AB (ref 101–111)
CO2: 25 mmol/L (ref 22–32)
Calcium: 7.6 mg/dL — ABNORMAL LOW (ref 8.9–10.3)
Creatinine, Ser: 3.29 mg/dL — ABNORMAL HIGH (ref 0.61–1.24)
GFR calc Af Amer: 21 mL/min — ABNORMAL LOW (ref >60)
GFR calc non Af Amer: 18 mL/min — ABNORMAL LOW (ref >60)
GLUCOSE: 115 mg/dL — AB (ref 65–99)
Phosphorus: <1 mg/dL — CL (ref 2.5–4.6)
Potassium: 4.2 mmol/L (ref 3.5–5.1)
SODIUM: 134 mmol/L — AB (ref 135–145)

## 2015-01-21 MED ORDER — DEXTROSE 5 % IV SOLN
30.0000 mmol | Freq: Once | INTRAVENOUS | Status: AC
  Administered 2015-01-21: 22:00:00 30 mmol via INTRAVENOUS
  Filled 2015-01-21: qty 10

## 2015-01-21 MED ORDER — HYDROCODONE-ACETAMINOPHEN 5-325 MG PO TABS
1.0000 | ORAL_TABLET | ORAL | Status: DC | PRN
  Administered 2015-01-22 – 2015-01-24 (×4): 1 via ORAL
  Filled 2015-01-21 (×4): qty 1

## 2015-01-21 NOTE — Progress Notes (Signed)
PT Cancellation Note  Patient Details Name: TIMARION AGCAOILI MRN: [id number] DOB: 1945-12-06   Cancelled Treatment:    Reason Eval/Treat Not Completed: Fatigue/lethargy limiting ability to participate. Treatment attempted; unable to awaken patient. Will re attempt as schedule allows.    Erline Levine Bishop 01/21/2015, 11:51 AM

## 2015-01-21 NOTE — Progress Notes (Signed)
ANTIBIOTIC CONSULT NOTE  Pharmacy Consult for renal dose adjustment of fluconazole Indication: Fungemia - candida parapsilosis   No Known Allergies  Patient Measurements: Height: 6' (182.9 cm) Weight: 152 lb 9.6 oz (69.219 kg) IBW/kg (Calculated) : 77.6  Vital Signs: Temp: 98.2 F (36.8 C) (08/09 0500) Temp Source: Oral (08/09 0500) BP: 98/55 mmHg (08/09 0500) Pulse Rate: 81 (08/09 0500)  Labs:  Recent Labs  01/20/15 0826  WBC 13.2*  HGB 6.5*  PLT 31*  CREATININE 4.35*   Estimated Creatinine Clearance: 16.1 mL/min (by C-G formula based on Cr of 4.35). No results for input(s): VANCOTROUGH, VANCOPEAK, VANCORANDOM, GENTTROUGH, GENTPEAK, GENTRANDOM, TOBRATROUGH, TOBRAPEAK, TOBRARND, AMIKACINPEAK, AMIKACINTROU, AMIKACIN in the last 72 hours.   Microbiology: Recent Results (from the past 720 hour(s))  Culture, blood (routine x 2)     Status: None   Collection Time: 12/27/14 11:28 AM  Result Value Ref Range Status   Specimen Description BLOOD  Final   Special Requests Immunocompromised  Final   Culture  Setup Time   Final    YEAST AEROBIC BOTTLE ONLY CRITICAL RESULT CALLED TO, READ BACK BY AND VERIFIED WITH: CATHY SUMMERLIN ON 12/29/14 AT 1000AM BY JEF    Culture CANDIDA PARAPSILOSIS AEROBIC BOTTLE ONLY   Final   Report Status 01/02/2015 FINAL  Final  Culture, blood (routine x 2)     Status: None   Collection Time: 12/27/14 11:36 AM  Result Value Ref Range Status   Specimen Description BLOOD  Final   Special Requests Immunocompromised  Final   Culture  Setup Time   Final    YEAST AEROBIC BOTTLE ONLY CRITICAL RESULT CALLED TO, READ BACK BY AND VERIFIED WITH: LISA ROMERO AT 0111 ON 12/29/14 [facility] CONFIRMED BY [place]    Culture CANDIDA PARAPSILOSIS AEROBIC BOTTLE ONLY   Final   Report Status 01/02/2015 FINAL  Final  Cath Tip Culture     Status: None   Collection Time: 12/27/14  4:17 PM  Result Value Ref Range Status   Specimen Description CATH TIP  Final   Special  Requests NONE  Final   Culture NO GROWTH 2 DAYS  Final   Report Status 12/30/2014 FINAL  Final  Culture, blood (routine x 2)     Status: None   Collection Time: 12/29/14 12:28 PM  Result Value Ref Range Status   Specimen Description BLOOD RIGHT ARM  Final   Special Requests   Final    BOTTLES DRAWN AEROBIC AND ANAEROBIC  AER 4CC ANA 3CC   Culture  Setup Time   Final    BUDDING YEAST SEEN AEROBIC BOTTLE ONLY CRITICAL RESULT CALLED TO, READ BACK BY AND VERIFIED WITH: BROKE ROBERTSON AT 2230 01/01/15.[facility] CONFIRMED BY [facility]    Culture CANDIDA PARAPSILOSIS AEROBIC BOTTLE ONLY   Final   Report Status 01/04/2015 FINAL  Final  Culture, blood (routine x 2)     Status: None   Collection Time: 12/29/14 12:28 PM  Result Value Ref Range Status   Specimen Description BLOOD RIGHT FATTY CASTS  Final   Special Requests BOTTLES DRAWN AEROBIC AND ANAEROBIC  5CC  Final   Culture  Setup Time   Final    YEAST AEROBIC BOTTLE ONLY CRITICAL VALUE NOTED.  VALUE IS CONSISTENT WITH PREVIOUSLY REPORTED AND CALLED VALUE.    Culture CANDIDA PARAPSILOSIS  Final   Report Status 01/04/2015 FINAL  Final  Cath Tip Culture     Status: None   Collection Time: 01/02/15 12:42 PM  Result Value Ref  Range Status   Specimen Description CATH TIP  Final   Special Requests Normal  Final   Culture NO GROWTH 3 DAYS  Final   Report Status 01/05/2015 FINAL  Final  Culture, blood (routine x 2)     Status: None   Collection Time: 01/02/15  6:34 PM  Result Value Ref Range Status   Specimen Description BLOOD RIGHT HAND  Final   Special Requests 5 BOTTLES DRAWN AEROBIC AND ANAEROBIC 5ML  Final   Culture  Setup Time   Final    BUDDING YEAST SEEN ANAEROBIC BOTTLE ONLY CRITICAL RESULT CALLED TO, READ BACK BY AND VERIFIED WITH: IRIS GUIDRY AT 2030 01/04/15 [facility]  CONFIRMED BY [facility]W    Culture CANDIDA PARAPSILOSIS  Final   Report Status 01/07/2015 FINAL  Final  Culture, blood (routine x 2)     Status: None   Collection Time: 01/05/15   8:59 AM  Result Value Ref Range Status   Specimen Description BLOOD RIGHT ASSIST CONTROL  Final   Special Requests BOTTLES DRAWN AEROBIC AND ANAEROBIC 1ML  Final   Culture NO GROWTH 5 DAYS  Final   Report Status 01/10/2015 FINAL  Final  Culture, blood (routine x 2)     Status: None   Collection Time: 01/05/15  9:10 AM  Result Value Ref Range Status   Specimen Description BLOOD LEFT HAND  Final   Special Requests BOTTLES DRAWN AEROBIC AND ANAEROBIC 1ML  Final   Culture NO GROWTH 5 DAYS  Final   Report Status 01/10/2015 FINAL  Final  Cath Tip Culture     Status: None   Collection Time: 01/05/15 11:30 AM  Result Value Ref Range Status   Specimen Description CATH TIP  Final   Special Requests NONE  Final   Culture PSEUDOMONAS AERUGINOSA KLEBSIELLA OXYTOCA   Final   Report Status 01/08/2015 FINAL  Final   Organism ID, Bacteria PSEUDOMONAS AERUGINOSA  Final   Organism ID, Bacteria KLEBSIELLA OXYTOCA  Final      Susceptibility   Klebsiella oxytoca - MIC*    AMPICILLIN >=32 RESISTANT Resistant     CEFTAZIDIME <=1 SENSITIVE Sensitive     CEFAZOLIN >=64 RESISTANT Resistant     CEFTRIAXONE 8 SENSITIVE Sensitive     CIPROFLOXACIN <=0.25 SENSITIVE Sensitive     GENTAMICIN <=1 SENSITIVE Sensitive     IMIPENEM <=0.25 SENSITIVE Sensitive     TRIMETH/SULFA <=20 SENSITIVE Sensitive     PIP/TAZO Value in next row Resistant      RESISTANT>=128    * KLEBSIELLA OXYTOCA   Pseudomonas aeruginosa - MIC*    CEFTAZIDIME Value in next row Sensitive      RESISTANT>=128    CIPROFLOXACIN Value in next row Resistant      RESISTANT>=128    GENTAMICIN Value in next row Sensitive      RESISTANT>=128    IMIPENEM Value in next row Resistant      RESISTANT>=128    PIP/TAZO Value in next row Sensitive      SENSITIVE8    * PSEUDOMONAS AERUGINOSA    Medical History: Past Medical History  Diagnosis Date  . Arthritis   . Hypertension   . RA (rheumatoid arthritis)   . Anemia   . Mantle cell lymphoma      Dr Mike Gip  . History of nuclear stress test     a. 12/2013: low risk, no sig ischemia, no EKG changes, no artifact, EF 63%  . Chronic kidney disease   . Sepsis  Dr Ola Spurr    Medications:  Anti-infectives    Start     Dose/Rate Route Frequency Ordered Stop   01/11/15 1800  fluconazole (DIFLUCAN) tablet 200 mg     200 mg Oral Once per day on Sun Tue Thu Sat 01/10/15 1423 02/02/15 2359   01/10/15 1800  fluconazole (DIFLUCAN) tablet 400 mg     400 mg Oral Once per day on Mon Wed Fri 01/10/15 1400 02/02/15 2359   01/10/15 1230  fluconazole (DIFLUCAN) tablet 400 mg  Status:  Discontinued     400 mg Oral Daily 01/10/15 1219 01/10/15 1400   01/02/15 1600  ceFAZolin (ANCEF) IVPB 1 g/50 mL premix     1 g 100 mL/hr over 30 Minutes Intravenous  Once 01/02/15 1550 01/02/15 1621   12/31/14 0000  vancomycin (VANCOCIN) IVPB 750 mg/150 ml premix  Status:  Discontinued     750 mg 150 mL/hr over 60 Minutes Intravenous Once per day on Mon Thu Sat 12/30/14 1126 12/30/14 1131   12/30/14 1200  vancomycin (VANCOCIN) IVPB 750 mg/150 ml premix  Status:  Discontinued     750 mg 150 mL/hr over 60 Minutes Intravenous Once per day on Mon Thu Sat 12/29/14 0903 12/30/14 1126   12/30/14 1129  vancomycin (VANCOCIN) IVPB 750 mg/150 ml premix  Status:  Discontinued    Comments:  Administer dose during last hour of dialysis as long as pre-dialysis random level is less then 20 mcg/mL   750 mg 150 mL/hr over 60 Minutes Intravenous Every Dialysis 12/30/14 1131 12/30/14 1527   12/29/14 1130  micafungin (MYCAMINE) 100 mg in sodium chloride 0.9 % 100 mL IVPB  Status:  Discontinued     100 mg 100 mL/hr over 1 Hours Intravenous Daily 12/29/14 1117 01/10/15 1219   12/28/14 1200  vancomycin (VANCOCIN) IVPB 750 mg/150 ml premix  Status:  Discontinued     750 mg 150 mL/hr over 60 Minutes Intravenous Once per day on Tue Thu Sat 12/27/14 1210 12/29/14 0903   12/27/14 1800  fluconazole (DIFLUCAN) IVPB 200 mg  Status:   Discontinued     200 mg 100 mL/hr over 60 Minutes Intravenous Every 24 hours 12/27/14 1402 12/27/14 1404   12/27/14 1800  fluconazole (DIFLUCAN) IVPB 100 mg  Status:  Discontinued     100 mg 50 mL/hr over 60 Minutes Intravenous Every 24 hours 12/27/14 1404 12/29/14 1117   12/27/14 1400  piperacillin-tazobactam (ZOSYN) IVPB 3.375 g  Status:  Discontinued     3.375 g 12.5 mL/hr over 240 Minutes Intravenous 3 times per day 12/27/14 1049 12/27/14 1210   12/27/14 1300  vancomycin (VANCOCIN) 1,500 mg in sodium chloride 0.9 % 500 mL IVPB     1,500 mg 250 mL/hr over 120 Minutes Intravenous  Once 12/27/14 1210 12/27/14 1521   12/27/14 1230  piperacillin-tazobactam (ZOSYN) IVPB 3.375 g  Status:  Discontinued     3.375 g 12.5 mL/hr over 240 Minutes Intravenous Every 12 hours 12/27/14 1210 12/30/14 1527   12/27/14 1100  vancomycin (VANCOCIN) powder 1,000 mg  Status:  Discontinued     1,000 mg Other To Surgery 12/27/14 1049 12/27/14 1119   12/27/14 1100  vancomycin (VANCOCIN) IVPB 1000 mg/200 mL premix  Status:  Discontinued     1,000 mg 200 mL/hr over 60 Minutes Intravenous Every 24 hours 12/27/14 1049 12/27/14 1210   12/27/14 0000  ceFAZolin (ANCEF) IVPB 1 g/50 mL premix    Comments:  Send with pt to OR   1  g 100 mL/hr over 30 Minutes Intravenous On call 12/26/14 2208 12/28/14 0000   12/19/14 1800  vancomycin (VANCOCIN) IVPB 750 mg/150 ml premix     750 mg 150 mL/hr over 60 Minutes Intravenous  Once 12/19/14 1515 12/19/14 1940   12/15/14 0630  vancomycin (VANCOCIN) IVPB 750 mg/150 ml premix     750 mg 150 mL/hr over 60 Minutes Intravenous  Once 12/15/14 0616 12/15/14 0726   12/14/14 1300  vancomycin (VANCOCIN) IVPB 750 mg/150 ml premix  Status:  Discontinued     750 mg 150 mL/hr over 60 Minutes Intravenous Every 48 hours 12/13/14 1107 12/14/14 0604   12/14/14 0603  vancomycin (VANCOCIN) IVPB 750 mg/150 ml premix  Status:  Discontinued     750 mg 150 mL/hr over 60 Minutes Intravenous Daily PRN  12/14/14 0604 12/21/14 1110   12/09/14 0900  erythromycin 250 mg in sodium chloride 0.9 % 100 mL IVPB  Status:  Discontinued     250 mg 100 mL/hr over 60 Minutes Intravenous 3 times per day 12/09/14 0859 12/13/14 1054   12/08/14 1100  metroNIDAZOLE (FLAGYL) IVPB 500 mg  Status:  Discontinued     500 mg 100 mL/hr over 60 Minutes Intravenous Every 8 hours 12/08/14 0958 12/11/14 1048   12/08/14 0900  vancomycin (VANCOCIN) IVPB 750 mg/150 ml premix  Status:  Discontinued     750 mg 150 mL/hr over 60 Minutes Intravenous Every 24 hours 12/07/14 1102 12/12/14 0955   12/07/14 1030  cefTAZidime (FORTAZ) 2 g in dextrose 5 % 50 mL IVPB  Status:  Discontinued     2 g 100 mL/hr over 30 Minutes Intravenous Every 24 hours 12/07/14 1021 12/11/14 1048   12/07/14 0700  vancomycin (VANCOCIN) IVPB 1000 mg/200 mL premix  Status:  Discontinued     1,000 mg 200 mL/hr over 60 Minutes Intravenous Every 24 hours 12/06/14 2225 12/07/14 1102   12/06/14 2200  piperacillin-tazobactam (ZOSYN) IVPB 4.5 g  Status:  Discontinued     4.5 g 200 mL/hr over 30 Minutes Intravenous 3 times per day 12/06/14 1946 12/07/14 1011   12/06/14 2000  vancomycin (VANCOCIN) IVPB 1000 mg/200 mL premix     1,000 mg 200 mL/hr over 60 Minutes Intravenous STAT 12/06/14 1947 12/06/14 2113   12/06/14 2000  cefTRIAXone (ROCEPHIN) 1 g in dextrose 5 % 50 mL IVPB - Premix  Status:  Discontinued     1 g 100 mL/hr over 30 Minutes Intravenous Every 24 hours 12/06/14 1959 12/07/14 1011   12/06/14 1945  piperacillin-tazobactam (ZOSYN) IVPB 3.375 g  Status:  Discontinued     3.375 g 100 mL/hr over 30 Minutes Intravenous 4 times per day 12/06/14 1940 12/06/14 2007   12/06/14 1945  vancomycin (VANCOCIN) IVPB 1000 mg/200 mL premix  Status:  Discontinued     1,000 mg 200 mL/hr over 60 Minutes Intravenous Every 12 hours 12/06/14 1940 12/06/14 2008   12/01/14 0945  piperacillin-tazobactam (ZOSYN) IVPB 3.375 g  Status:  Discontinued     3.375 g 12.5 mL/hr  over 240 Minutes Intravenous 3 times per day 12/01/14 0936 12/03/14 1337   11/30/14 1000  cefTRIAXone (ROCEPHIN) 2 g in dextrose 5 % 50 mL IVPB - Premix  Status:  Discontinued    Comments:  Entered per MD progress note   2 g 100 mL/hr over 30 Minutes Intravenous Every 24 hours 11/29/14 2157 12/06/14 1940   11/29/14 1415  cefTRIAXone (ROCEPHIN) 1 g in dextrose 5 % 50 mL IVPB - Premix  1 g 100 mL/hr over 30 Minutes Intravenous  Once 11/29/14 1406 11/29/14 1452   11/29/14 1414  cefTRIAXone (ROCEPHIN) 20 MG/ML IVPB 50 mL    Comments:  MONAR, NELLIE: cabinet override      11/29/14 1414 11/29/14 1424     Assessment: Pharmacy consulted to adjust fluconazole dose for renal function in this 69 year old male with ESRD on HD 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  Per ID will plan to treat for 4 week since first negative blood culture on 7/24  Plan:  Continue current orders for fluconazole 400mg  PO QPM on HD days and 200mg  PO QPM on all other days. Doses to be given after dialysis. Scheduled to stop on 8/21, 4 weeks from negative blood culture.  Patient tentatively scheduled for HD on MWF, however nephrology plans to evaluate daily for dialysis need. Will continue doses according to current HD schedule (MWF). Pharmacy will need to follow closely and adjust dose schedule if HD schedule changes or extra sessions needed.  Murrell Converse, PharmD Clinical Pharmacist 01/21/2015

## 2015-01-21 NOTE — Progress Notes (Signed)
Subjective:   Patient is doing fair. Received blood transfusion. Hematology assessment ongoing for evaluation of anemia and thrombocytopenia. Today he does not have any acute complaints. Son is at bedside.   Objective:  Vital signs in last 24 hours:  Temp:  [97.7 F (36.5 C)-98.2 F (36.8 C)] 97.8 F (36.6 C) (08/09 1308) Pulse Rate:  [59-93] 72 (08/09 1327) Resp:  [20] 20 (08/09 1308) BP: (85-98)/(48-65) 95/65 mmHg (08/09 1327) SpO2:  [98 %-100 %] 100 % (08/09 1308) Weight:  [69.219 kg (152 lb 9.6 oz)] 69.219 kg (152 lb 9.6 oz) (08/09 0500)  Weight change: 0.544 kg (1 lb 3.2 oz) Filed Weights   01/19/15 0500 01/20/15 0500 01/21/15 0500  Weight: 68.811 kg (151 lb 11.2 oz) 68.675 kg (151 lb 6.4 oz) 69.219 kg (152 lb 9.6 oz)    Intake/Output: I/O last 3 completed shifts: In: -  Out: -782 [Urine:1; Stool:1]   Intake/Output this shift:     Physical Exam: General: chronically ill appearing  Head: oral mucosa moist  Eyes: Anicteric  Neck: Supple, trachea midline  Lungs:  Clear to auscultation normal effort  Heart: S1S2 no rubs  Abdomen:  Soft, BS present  Extremities: No LE edema  Neurologic: Awake, alert, oriented today  Skin: No acute rashes  Access: LIJ permcath  01/10/15 Dr. Lucky Cowboy    Basic Metabolic Panel:  Recent Labs Lab 01/15/15 0423 01/16/15 0821 01/17/15 0950 01/20/15 0826  NA 138 138 136 138  K 2.6* 3.0* 3.5 6.1*  CL 100* 99* 98* 103  CO2 26 27 26 24   GLUCOSE 120* 113* 90 82  BUN 34* 21* 28* 35*  CREATININE 4.50* 3.09* 3.90* 4.35*  CALCIUM 7.9* 7.8* 7.8* 8.2*  MG 1.8  --   --   --   PHOS  --   --  1.4*  --     Liver Function Tests:  Recent Labs Lab 01/17/15 0950  ALBUMIN 1.9*   No results for input(s): LIPASE, AMYLASE in the last 168 hours. No results for input(s): AMMONIA in the last 168 hours.  CBC:  Recent Labs Lab 01/16/15 0821 01/17/15 0949 01/18/15 0543 01/20/15 0826 01/21/15 1232  WBC 17.5* 16.6* 13.0* 13.2* 11.3*  HGB 7.9*  7.6* 6.6* 6.5* 8.1*  HCT 23.7* 23.3* 19.3* 19.7* 24.3*  MCV 89.1 89.9 89.9 91.7 89.0  PLT 64* 55* 40* 31* 26*    Cardiac Enzymes: No results for input(s): CKTOTAL, CKMB, CKMBINDEX, TROPONINI in the last 168 hours.  BNP: Invalid input(s): POCBNP  CBG: No results for input(s): GLUCAP in the last 168 hours.  Microbiology: Results for orders placed or performed during the hospital encounter of 11/29/14  MRSA PCR Screening     Status: None   Collection Time: 11/29/14 12:13 PM  Result Value Ref Range Status   MRSA by PCR NEGATIVE NEGATIVE Final    Comment:        The GeneXpert MRSA Assay (FDA approved for NASAL specimens only), is one component of a comprehensive MRSA colonization surveillance program. It is not intended to diagnose MRSA infection nor to guide or monitor treatment for MRSA infections.   Culture, blood (routine x 2)     Status: None   Collection Time: 11/29/14  1:19 PM  Result Value Ref Range Status   Specimen Description BLOOD  Final   Special Requests Immunocompromised  Final   Culture NO GROWTH 5 DAYS  Final   Report Status 12/04/2014 FINAL  Final  Culture, blood (routine x 2)  Status: None   Collection Time: 11/29/14  3:04 PM  Result Value Ref Range Status   Specimen Description Blood  Final   Special Requests Immunocompromised  Final   Report Status 01/03/2015 FINAL  Final  Urine culture     Status: None   Collection Time: 11/30/14 10:12 AM  Result Value Ref Range Status   Specimen Description URINE, CATHETERIZED  Final   Special Requests Immunocompromised  Final   Culture NO GROWTH 2 DAYS  Final   Report Status 12/02/2014 FINAL  Final  Culture, blood (routine x 2)     Status: None   Collection Time: 12/06/14  8:08 PM  Result Value Ref Range Status   Specimen Description BLOOD  Final   Special Requests NONE  Final   Culture  Setup Time   Final    GRAM POSITIVE COCCI IN BOTH AEROBIC AND ANAEROBIC BOTTLES CRITICAL RESULT CALLED TO, READ BACK  BY AND VERIFIED WITH: JENNIFER BEZARD AT 2979 12/08/14.[facility] CONFIRMED BY [facility]    Culture   Final    STAPHYLOCOCCUS AURICULARIS IN BOTH AEROBIC AND ANAEROBIC BOTTLES    Report Status 12/11/2014 FINAL  Final   Organism ID, Bacteria STAPHYLOCOCCUS AURICULARIS  Final      Susceptibility   Staphylococcus auricularis - MIC*    CIPROFLOXACIN >=8 RESISTANT Resistant     ERYTHROMYCIN >=8 RESISTANT Resistant     GENTAMICIN <=0.5 SENSITIVE Sensitive     OXACILLIN >=4 RESISTANT Resistant     TETRACYCLINE <=1 SENSITIVE Sensitive     VANCOMYCIN 1 SENSITIVE Sensitive     CLINDAMYCIN <=0.25 SENSITIVE Sensitive     TRIMETH/SULFA Value in next row Sensitive      SENSITIVE<=20    LEVOFLOXACIN Value in next row Intermediate      INTERMEDIATE4    * STAPHYLOCOCCUS AURICULARIS  Culture, blood (routine x 2)     Status: None   Collection Time: 12/06/14  8:40 PM  Result Value Ref Range Status   Specimen Description BLOOD  Final   Special Requests NONE  Final   Culture NO GROWTH 5 DAYS  Final   Report Status 12/11/2014 FINAL  Final  Culture, blood (routine x 2)     Status: None   Collection Time: 12/08/14 11:40 AM  Result Value Ref Range Status   Specimen Description BLOOD  Final   Special Requests Normal  Final   Culture NO GROWTH 6 DAYS  Final   Report Status 12/14/2014 FINAL  Final  Culture, blood (routine x 2)     Status: None   Collection Time: 12/08/14 11:49 AM  Result Value Ref Range Status   Specimen Description BLOOD  Final   Special Requests Normal  Final   Culture NO GROWTH 6 DAYS  Final   Report Status 12/14/2014 FINAL  Final  C difficile quick scan w PCR reflex ([facility] only)     Status: None   Collection Time: 12/14/14 10:11 AM  Result Value Ref Range Status   C Diff antigen NEGATIVE  Final   C Diff toxin NEGATIVE  Final   C Diff interpretation Negative for C. difficile  Final  Culture, blood (routine x 2)     Status: None   Collection Time: 12/27/14 11:28 AM  Result Value Ref Range  Status   Specimen Description BLOOD  Final   Special Requests Immunocompromised  Final   Culture  Setup Time   Final    YEAST AEROBIC BOTTLE ONLY CRITICAL RESULT CALLED TO, READ BACK BY  AND VERIFIED WITH: CATHY SUMMERLIN ON 12/29/14 AT 1000AM BY JEF    Culture CANDIDA PARAPSILOSIS AEROBIC BOTTLE ONLY   Final   Report Status 01/02/2015 FINAL  Final  Culture, blood (routine x 2)     Status: None   Collection Time: 12/27/14 11:36 AM  Result Value Ref Range Status   Specimen Description BLOOD  Final   Special Requests Immunocompromised  Final   Culture  Setup Time   Final    YEAST AEROBIC BOTTLE ONLY CRITICAL RESULT CALLED TO, READ BACK BY AND VERIFIED WITH: LISA ROMERO AT 0111 ON 12/29/14 [facility] CONFIRMED BY [place]    Culture CANDIDA PARAPSILOSIS AEROBIC BOTTLE ONLY   Final   Report Status 01/02/2015 FINAL  Final  Cath Tip Culture     Status: None   Collection Time: 12/27/14  4:17 PM  Result Value Ref Range Status   Specimen Description CATH TIP  Final   Special Requests NONE  Final   Culture NO GROWTH 2 DAYS  Final   Report Status 12/30/2014 FINAL  Final  Culture, blood (routine x 2)     Status: None   Collection Time: 12/29/14 12:28 PM  Result Value Ref Range Status   Specimen Description BLOOD RIGHT ARM  Final   Special Requests   Final    BOTTLES DRAWN AEROBIC AND ANAEROBIC  AER 4CC ANA 3CC   Culture  Setup Time   Final    BUDDING YEAST SEEN AEROBIC BOTTLE ONLY CRITICAL RESULT CALLED TO, READ BACK BY AND VERIFIED WITH: BROKE ROBERTSON AT 2230 01/01/15.[facility]SH CONFIRMED BY SDR    Culture CANDIDA PARAPSILOSIS AEROBIC BOTTLE ONLY   Final   Report Status 01/04/2015 FINAL  Final  Culture, blood (routine x 2)     Status: None   Collection Time: 12/29/14 12:28 PM  Result Value Ref Range Status   Specimen Description BLOOD RIGHT FATTY CASTS  Final   Special Requests BOTTLES DRAWN AEROBIC AND ANAEROBIC  5CC  Final   Culture  Setup Time   Final    YEAST AEROBIC BOTTLE ONLY CRITICAL  VALUE NOTED.  VALUE IS CONSISTENT WITH PREVIOUSLY REPORTED AND CALLED VALUE.    Culture CANDIDA PARAPSILOSIS  Final   Report Status 01/04/2015 FINAL  Final  Cath Tip Culture     Status: None   Collection Time: 01/02/15 12:42 PM  Result Value Ref Range Status   Specimen Description CATH TIP  Final   Special Requests Normal  Final   Culture NO GROWTH 3 DAYS  Final   Report Status 01/05/2015 FINAL  Final  Culture, blood (routine x 2)     Status: None   Collection Time: 01/02/15  6:34 PM  Result Value Ref Range Status   Specimen Description BLOOD RIGHT HAND  Final   Special Requests 5 BOTTLES DRAWN AEROBIC AND ANAEROBIC 5ML  Final   Culture  Setup Time   Final    BUDDING YEAST SEEN ANAEROBIC BOTTLE ONLY CRITICAL RESULT CALLED TO, READ BACK BY AND VERIFIED WITH: IRIS GUIDRY AT 2030 01/04/15 SDR  CONFIRMED BY RW    Culture CANDIDA PARAPSILOSIS  Final   Report Status 01/07/2015 FINAL  Final  Culture, blood (routine x 2)     Status: None   Collection Time: 01/05/15  8:59 AM  Result Value Ref Range Status   Specimen Description BLOOD RIGHT ASSIST CONTROL  Final   Special Requests BOTTLES DRAWN AEROBIC AND ANAEROBIC 1ML  Final   Culture NO GROWTH 5 DAYS  Final  Report Status 01/10/2015 FINAL  Final  Culture, blood (routine x 2)     Status: None   Collection Time: 01/05/15  9:10 AM  Result Value Ref Range Status   Specimen Description BLOOD LEFT HAND  Final   Special Requests BOTTLES DRAWN AEROBIC AND ANAEROBIC 1ML  Final   Culture NO GROWTH 5 DAYS  Final   Report Status 01/10/2015 FINAL  Final  Cath Tip Culture     Status: None   Collection Time: 01/05/15 11:30 AM  Result Value Ref Range Status   Specimen Description CATH TIP  Final   Special Requests NONE  Final   Culture PSEUDOMONAS AERUGINOSA KLEBSIELLA OXYTOCA   Final   Report Status 01/08/2015 FINAL  Final   Organism ID, Bacteria PSEUDOMONAS AERUGINOSA  Final   Organism ID, Bacteria KLEBSIELLA OXYTOCA  Final       Susceptibility   Klebsiella oxytoca - MIC*    AMPICILLIN >=32 RESISTANT Resistant     CEFTAZIDIME <=1 SENSITIVE Sensitive     CEFAZOLIN >=64 RESISTANT Resistant     CEFTRIAXONE 8 SENSITIVE Sensitive     CIPROFLOXACIN <=0.25 SENSITIVE Sensitive     GENTAMICIN <=1 SENSITIVE Sensitive     IMIPENEM <=0.25 SENSITIVE Sensitive     TRIMETH/SULFA <=20 SENSITIVE Sensitive     PIP/TAZO Value in next row Resistant      RESISTANT>=128    * KLEBSIELLA OXYTOCA   Pseudomonas aeruginosa - MIC*    CEFTAZIDIME Value in next row Sensitive      RESISTANT>=128    CIPROFLOXACIN Value in next row Resistant      RESISTANT>=128    GENTAMICIN Value in next row Sensitive      RESISTANT>=128    IMIPENEM Value in next row Resistant      RESISTANT>=128    PIP/TAZO Value in next row Sensitive      SENSITIVE8    * PSEUDOMONAS AERUGINOSA    Coagulation Studies: No results for input(s): LABPROT, INR in the last 72 hours.  Urinalysis: No results for input(s): COLORURINE, LABSPEC, PHURINE, GLUCOSEU, HGBUR, BILIRUBINUR, KETONESUR, PROTEINUR, UROBILINOGEN, NITRITE, LEUKOCYTESUR in the last 72 hours.  Invalid input(s): APPERANCEUR    Imaging: No results found.   Medications:     . amiodarone  200 mg Oral Daily  . antiseptic oral rinse  7 mL Mouth Rinse BID  . apixaban  2.5 mg Oral BID  . dronabinol  2.5 mg Oral BID AC  . epoetin (EPOGEN/PROCRIT) injection  10,000 Units Intravenous Q M,W,F-HD  . feeding supplement (NEPRO CARB STEADY)  237 mL Oral BID BM  . fluconazole  200 mg Oral Once per day on Sun Tue Thu Sat  . fluconazole  400 mg Oral Once per day on Mon Wed Fri  . mometasone-formoterol  2 puff Inhalation BID  . pantoprazole  40 mg Oral BID  . scopolamine  1 patch Transdermal Q72H  . sodium chloride  3 mL Intravenous Q12H   sodium chloride, sodium chloride, acetaminophen **OR** acetaminophen, albuterol, heparin, HYDROcodone-acetaminophen, lidocaine (PF), lidocaine-prilocaine, magnesium  hydroxide, metoprolol, ondansetron (ZOFRAN) IV, promethazine  Assessment/ Plan:  Mr. Colello is a 63 black male with progressive mantle cell lymphoma, RICE chemo in 11/2014, hx left sided hydronephrosis and hydroureter. S/p ureteral stent placement   Hospital course complicated by sepsis with Klebsiella oxytoca, hypernatremia. Now with fungemia.   1. End Stage Renal Failure:  Acute renal failure due to ATN and now without recovery.  - next HD Tomorrow - To be dialyzed  in a chair  2.  Sepsis with fungemia. A41.9, B49. Status post bacteremia with klebsiella oxytoca: completed two weeks for IV ceftazidime. Subsequently growing candida parapsilosis.  Subcutaneous port removed 01/02/15. - Appreciate ID input.  Currently on fluconazole for 4 weeks. End on 8/21.   3.  Anemia of chronic kidney disease/mantle cell lymphoma: - status post transfusion on 7/27, 7/30, 8/3, 8/7 - continue epogen  IV with HD, use cleared by hematology/onc.  - Continue to monitor hemoglobin closely. Transfuse as necessary.   - Also noted to have thrombocytopenia - hematology assessment ongoing  4. Secondary Hyperparathyroidism: PTH 126. Phos 1.4  - not on binders. No indication for activated vitamin D    LOS: 18 Josean Lycan 8/9/20164:41 PM

## 2015-01-21 NOTE — Progress Notes (Signed)
Notified MD of critical lab value - phosphorus <1.  Dr. Jannifer Franklin is ordering phosphorus replacement.  Platelet count also low at 24, we will continue to monitor per MD.

## 2015-01-21 NOTE — Progress Notes (Signed)
Spoke with May, [facility] rep at [phone], to notify of non-emergent EMS transport.  Auth notification reference given as [id number].   Service date range good from 01/21/15 - 04/21/15.   Gap exception requested to determine if services can be considered at an in-network level.

## 2015-01-21 NOTE — Plan of Care (Signed)
Problem: Discharge Progression Outcomes Goal: Other Discharge Outcomes/Goals Outcome: Progressing Plan of care progress to goal< patient admitted on 10/29/9840 with metabolic encephalopathy, UTI, and history of mantle cell lymphoma. Patient is oriented x 2, confused and forgetful at times. Patient with left perm catheter for dialysis, had dialysis today, and 2 units of PRBC. Patient with poor appetite, takes medications whole with water, no trouble with swallowing.  Patient with state 2 to sacral area, foam dressing intact.  Patient complained of left foot pain, nurse administered tylenol.  Hemodynamically: v/s stable.

## 2015-01-21 NOTE — Progress Notes (Signed)
Notified Dr. Mike Gip of platelet count. No interventions at this time. More lab work ordered for tomorrow am.

## 2015-01-21 NOTE — Care Management Note (Signed)
Placement is confirmed at [facility] RD TTS schedule.  He is to start Thursday 01/23/15 between 11:30 and 12.  I am following up on orders from Dr. Candiss Norse (I have sent him a message) for outpatient dialysis orders that will give his run time. Then I will have a exact time from the clinic. [name] Liaison [phone]

## 2015-01-21 NOTE — Progress Notes (Signed)
[facility] Hematology/Oncology Progress Note  Date of admission: 11/29/2014  Hospital day:  01/21/2015   Chief Complaint: Caleb Francis is a 69 y.o. 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  Subjective:  Sitting up in bed watching TV. Conversant.  Denies any complaint.  States that he is eating well and working with physical therapy.  Nursing notes minimal oral intake.  Patient has not gotten out of bed since admission.  Social History: The patient is alone today.  Allergies: No Known Allergies  Scheduled Medications: . amiodarone  200 mg Oral Daily  . antiseptic oral rinse  7 mL Mouth Rinse BID  . apixaban  2.5 mg Oral BID  . dronabinol  2.5 mg Oral BID AC  . epoetin (EPOGEN/PROCRIT) injection  10,000 Units Intravenous Q M,W,F-HD  . feeding supplement (NEPRO CARB STEADY)  237 mL Oral BID BM  . fluconazole  200 mg Oral Once per day on Sun Tue Thu Sat  . fluconazole  400 mg Oral Once per day on Mon Wed Fri  . mometasone-formoterol  2 puff Inhalation BID  . pantoprazole  40 mg Oral BID  . potassium phosphate IVPB (mmol)  30 mmol Intravenous Once  . scopolamine  1 patch Transdermal Q72H  . sodium chloride  3 mL Intravenous Q12H   Review of Systems: GENERAL:  Denies any complaint.  No fevers or sweats.  Weight loss of 10 pounds since admission.  Denies pain. PERFORMANCE STATUS (ECOG):  4 HEENT:  No visual changes, runny nose, sore throat, mouth sores or tenderness. Lungs: No shortness of breath or cough.  No hemoptysis. Cardiac:  No chest pain, palpitations, orthopnea, or PND. GI:  Appetite remains poor.  Minimal intake.  No nausea, vomiting, diarrhea, constipation, melena or hematochezia. No abdominal pain. GU:  Denies any symptoms. On chronic dialysis (Mondays, Wednesdays, and Fridays). Musculoskeletal:  No back or joint pain.  Extremities:  No pain or  swelling. Skin:  Denies any rashes or skin changes. Neuro:  General weakness.  No headache, focal weakness or numbness. Pain:  No focal pain. Review of systems:  All other systems reviewed and found to be negative.  Physical Exam: Blood pressure 97/47, pulse 102, temperature 98.3 F (36.8 C), temperature source Oral, resp. rate 18, height 6' (1.829 m), weight 152 lb 9.6 oz (69.219 kg), SpO2 100 %.  GENERAL: Chronically ill appearing gentleman lying in bed on the medical oncology unit in no acute distress. MENTAL STATUS:Alert and oriented to person, place and time. HEAD: Temporal wasting.  Normocephalic, atraumatic, face symmetric, no Cushingoid features. EYES: Brown eyes. Pupils equal round and reactive to light and accomodation. No conjunctivitis or scleral icterus. ENT: Oropharynx clear. Poor dentition.  RESPIRATORY: Decreased breath sounds at the bases.  No wheezes or rhonchi. CARDIOVASCULAR: Regular rate and rhythm without murmur, rub or gallop. ABDOMEN: Soft, minimal bowel sounds, and no hepatosplenomegaly. No tenderness. No guarding or rebound tenderness.  No masses. LYMPH NODES:  Left axillary node conglomerate 6 cm. SKIN: No rashes, ulcers or lesions. EXTREMITIES: Trace ankle edema.  No skin discoloration or tenderness. No palpable cords. NEUROLOGICAL:  Appropriate.  Results for orders placed or performed during the hospital encounter of 11/29/14 (from the past 48 hour(s))  CBC     Status: Abnormal   Collection Time: 01/20/15  8:26 AM  Result Value Ref Range   WBC 13.2 (H) 3.8 -  10.6 K/uL   RBC 2.15 (L) 4.40 - 5.90 MIL/uL   Hemoglobin 6.5 (L) 13.0 - 18.0 g/dL   HCT 19.7 (L) 40.0 - 52.0 %   MCV 91.7 80.0 - 100.0 fL   MCH 30.2 26.0 - 34.0 pg   MCHC 32.9 32.0 - 36.0 g/dL   RDW 17.4 (H) 11.5 - 14.5 %   Platelets 31 (L) 150 - 440 K/uL  Basic metabolic panel     Status: Abnormal   Collection Time: 01/20/15  8:26 AM  Result Value Ref Range   Sodium 138 135 - 145  mmol/L   Potassium 6.1 (H) 3.5 - 5.1 mmol/L   Chloride 103 101 - 111 mmol/L   CO2 24 22 - 32 mmol/L   Glucose, Bld 82 65 - 99 mg/dL   BUN 35 (H) 6 - 20 mg/dL   Creatinine, Ser 4.35 (H) 0.61 - 1.24 mg/dL   Calcium 8.2 (L) 8.9 - 10.3 mg/dL   GFR calc non Af Amer 13 (L) >60 mL/min   GFR calc Af Amer 15 (L) >60 mL/min    Comment: (NOTE) The eGFR has been calculated using the CKD EPI equation. This calculation has not been validated in all clinical situations. eGFR's persistently <60 mL/min signify possible Chronic Kidney Disease.    Anion gap 11 5 - 15  Prepare RBC     Status: None   Collection Time: 01/20/15 10:59 AM  Result Value Ref Range   Order Confirmation ORDER PROCESSED BY BLOOD BANK   Type and screen     Status: None   Collection Time: 01/20/15 10:59 AM  Result Value Ref Range   ABO/RH(D) O POS    Antibody Screen NEG    Sample Expiration 01/23/2015    Unit Number [id number]    Blood Component Type RBC, LR IRR    Unit division 00    Status of Unit ISSUED,FINAL    Transfusion Status OK TO TRANSFUSE    Crossmatch Result Compatible    Unit Number [id number]    Blood Component Type RBC, LR IRR    Unit division 00    Status of Unit ISSUED,FINAL    Transfusion Status OK TO TRANSFUSE    Crossmatch Result Compatible   CBC     Status: Abnormal   Collection Time: 01/21/15 12:32 PM  Result Value Ref Range   WBC 11.3 (H) 3.8 - 10.6 K/uL   RBC 2.73 (L) 4.40 - 5.90 MIL/uL   Hemoglobin 8.1 (L) 13.0 - 18.0 g/dL   HCT 24.3 (L) 40.0 - 52.0 %   MCV 89.0 80.0 - 100.0 fL   MCH 29.5 26.0 - 34.0 pg   MCHC 33.1 32.0 - 36.0 g/dL   RDW 18.4 (H) 11.5 - 14.5 %   Platelets 26 (LL) 150 - 440 K/uL    Comment: PLATELET COUNT CONFIRMED BY SMEAR CRITICAL RESULT CALLED TO, READ BACK BY AND VERIFIED WITH: ERIN PARDINI ON 01/20/15 AT 1410 BY TB.   Renal function panel     Status: Abnormal   Collection Time: 01/21/15  8:06 PM  Result Value Ref Range   Sodium 134 (L) 135 - 145 mmol/L    Potassium 4.2 3.5 - 5.1 mmol/L   Chloride 96 (L) 101 - 111 mmol/L   CO2 25 22 - 32 mmol/L   Glucose, Bld 115 (H) 65 - 99 mg/dL   BUN 21 (H) 6 - 20 mg/dL   Creatinine, Ser 3.29 (H) 0.61 - 1.24 mg/dL   Calcium  7.6 (L) 8.9 - 10.3 mg/dL   Phosphorus <1.0 (LL) 2.5 - 4.6 mg/dL    Comment: CRITICAL RESULT CALLED TO, READ BACK BY AND VERIFIED WITH  CYNTHIA BURGESS AT 2054 01/21/15 [facility] RESULTS VERIFIED BY REPEAT TESTING    Albumin 1.9 (L) 3.5 - 5.0 g/dL   GFR calc non Af Amer 18 (L) >60 mL/min   GFR calc Af Amer 21 (L) >60 mL/min    Comment: (NOTE) The eGFR has been calculated using the CKD EPI equation. This calculation has not been validated in all clinical situations. eGFR's persistently <60 mL/min signify possible Chronic Kidney Disease.    Anion gap 13 5 - 15  CBC     Status: Abnormal   Collection Time: 01/21/15  8:06 PM  Result Value Ref Range   WBC 12.3 (H) 3.8 - 10.6 K/uL   RBC 2.76 (L) 4.40 - 5.90 MIL/uL   Hemoglobin 8.0 (L) 13.0 - 18.0 g/dL   HCT 24.5 (L) 40.0 - 52.0 %   MCV 88.7 80.0 - 100.0 fL   MCH 29.0 26.0 - 34.0 pg   MCHC 32.7 32.0 - 36.0 g/dL   RDW 17.7 (H) 11.5 - 14.5 %   Platelets 24 (LL) 150 - 440 K/uL    Comment: CRITICAL RESULT CALLED TO, READ BACK BY AND VERIFIED WITH: CALLED TO CYNTHIA BURGESS 01/21/2015 [facility] PLATELET COUNT CONFIRMED BY SMEAR    No results found.  Assessment:  KHALEED HOLAN is a 69 y.o. 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 He was admitted with Klebsiella sepsis due to pyelonephritis and associated altered mental status. He subsequently developed renal insufficiency with assciated hypernatremia. He was initially in the ICU then on the telemetry unit.  He has a history of ICU psychosis and altered mental status secondary to hypernatremia.  He developed progressive renal failure.  Dialysis was started on 12/18/2014.  He has remained on chronic dialysis.  Dialysis catheters have been  exchanged secondary to fungemia.  His port-a-cath was removed.  He had a platelet transfusion reaction on 12/06/2014. Blood cultures from 12/06/2014 grew staph auricularis (coag negative staph) . He had an upper GI bleed on 12/07/2014. He has received multiple PRBCs transfusions (last  01/20/2015).  His anemia is multifactorial.  He has been receiving Procrit. Platelet count normalized on 12/23/2014 and has slowly drifted down since 01/12/2015.  He has had no obvious bleeding.  WBC and ANC are adequate.  He is off Neupogen (GCSF).  He had an ileus.  A G tube and rectal tube were placed and removed.  He was temporarily on TPN.  His oral intake is minimal.  He has not gotten out of bed since admission.  Plan: 1.  Hematology/Oncology: Day 65 s/p chemotherapy. Clinically he appears to have progressive disease given the palpable enlarging left axillary lymph node.  He is not a candidate for further chemotherapy at this time given his performance status.  I doubt that he will ever be a candidate for bone marrow transplant as he is unable to care for himself (performance status) and progressive disease.  I will discuss this with his son.  He is a candidate for Hospice.  He has anemia likely secondary to marrow replacement and anemia of chronic renal disease.  He is receiving Procrit 10,000 units with dialysis.  WBC is slightly elevated.  He has known circulating mantle cell lymphoma.  Platelet count has been decreasing steadily since 01/12/2015 likely secondary to progressive  marrow disease.  Will rule out heparin induced thrombocytopenia (HIT).  Doubt DIC (check PT and fibrinogen).  No current evidence of infection leading to increased consumption.    Patient remains transfusion dependent (last PRBC transfusion 01/20/2015).  Maintain platelets > 20,000.  Patient started on Eliquis on 19/62/2297 for left basilic vein superficial extensive thrombosis.  Would hold Eliquis with platelet count < 30-50,000  secondary to increased risk of bleeding.  Premed all blood products with Tylenol and Benadryl.  2.  Infectious disease: Klebsiella oxytoca bacteremia treated.  Vancomycin completed for coag negative staph.  No evidence of endocarditis by echo. Blood culture from 12/27/2014, 12/29/2014, and 01/02/2015 revealed candida parapsilosis.  Port-a-cath and dialysis lines removed.  Patient on fluconazole for a total of 4 weeks from 1st negative culture (01/05/2015).  3.  Nephrology: Chronic renal insufficiency on dialysis on Mondays, Wednesdays, and Fridays.  Permcath placed on 01/10/2015.   4.  Gastroenterology: UGI bleed resolved. Ileus s/p rectal tube, discontinued 12/24/2014.  TPN started 12/12/2014.  Off TPN.  Poor appetite on Marinol.  Family and patient have no interest in PEG tube placement.  Patient has severe protein malnutrition.  Albumen 1.9.  He has lost 10 pounds since admission.  Patient has little desire to eat.  Calorie count stopped.  Discussed importance of eating with patient.  Without adequate nutrition, patient will continue to dwindle.  5.  Disposition: Prognosis extremely poor.  Hospital day 93.  He has not been out of bed (debilitated).  He has participated minimally with physical therapy.  Given progressive adenopathy and declining counts, patient not a candidate for bone marrow transplant.  He is not a candidate for further chemotherapy given his performance status.  Will arrange family meeting with palliative care.  Short term plan is to go to a skilled nursing facility this week for rehabilitation. He is a Hospice candidate.  DNR code status.   Lequita Asal, MD  01/21/2015, 10:38 PM

## 2015-01-21 NOTE — Plan of Care (Signed)
Problem: Discharge Progression Outcomes Goal: Other Discharge Outcomes/Goals Outcome: Progressing Plan of care progress to goal: Pts BP remains on lower side. MD aware. Held BP meds this morning. Onco saw patient today, new labs ordered. No c/o pain. Refuses to move out of bed for nursing. Poor appetite.

## 2015-01-21 NOTE — Progress Notes (Signed)
[place] at [place] NAME: Tanvir Hipple    MR#:  [id number]  DATE OF BIRTH:  04/05/1946  SUBJECTIVE:  Still poor oral intake.  No complains. HB droped ,    platelets are dropping further.  given 2 units PRBC transfusion yesterday in HD.  He refused to have PT yesterday.   REVIEW OF SYSTEMS:   Review of Systems  Constitutional: Positive for malaise/fatigue. Negative for fever, chills and weight loss.  HENT: Negative for ear discharge, ear pain and nosebleeds.   Eyes: Negative for blurred vision, pain and discharge.  Respiratory: Negative for sputum production, shortness of breath, wheezing and stridor.   Cardiovascular: Negative for chest pain, palpitations, orthopnea and PND.  Gastrointestinal: Negative for nausea, vomiting, abdominal pain and diarrhea.  Genitourinary: Negative for urgency and frequency.  Musculoskeletal: Negative for back pain and joint pain.  Neurological: Positive for weakness. Negative for sensory change, speech change and focal weakness.  Psychiatric/Behavioral: Negative for depression. The patient is not nervous/anxious.    Tolerating Diet:very little Tolerating PT: eval pending   DRUG ALLERGIES:  No Known Allergies VITALS:  Blood pressure 98/55, pulse 81, temperature 98.2 F (36.8 C), temperature source Oral, resp. rate 20, height 6' (1.829 m), weight 69.219 kg (152 lb 9.6 oz), SpO2 98 %. PHYSICAL EXAMINATION:   Physical Exam GENERAL:  69 y.o.-year-old patient lying in the bed with no acute distress. Appears chronically ill and the lethargic. Sitting in the chair during dialysis just now. EYES: Pupils equal, round, reactive to light and accommodation. No scleral icterus. Extraocular muscles intact. Conjunctiva pale. HEENT: Head atraumatic, normocephalic. Oropharynx and nasopharynx clear.  NECK:  Supple, no jugular venous distention. No thyroid enlargement, no tenderness.  LUNGS: Normal breath sounds  bilaterally, no wheezing, rales, rhonchi. No use of accessory muscles of respiration.  CARDIOVASCULAR: S1, S2 normal. No murmurs, rubs, or gallops.  ABDOMEN: Soft, nontender, mild distended. Bowel sounds present. No organomegaly or mass.  EXTREMITIES: No cyanosis, clubbing or edema b/l.  NEUROLOGIC: Cranial nerves II through XII are intact. No focal Motor or sensory deficits b/l.  Weak,severe deconditioning PSYCHIATRIC:  patient is alert and oriented x 3.  SKIN: stage II sacral ulcer.  LABORATORY PANEL:   CBC  Recent Labs Lab 01/20/15 0826  WBC 13.2*  HGB 6.5*  HCT 19.7*  PLT 31*    Chemistries   Recent Labs Lab 01/15/15 0423  01/20/15 0826  NA 138  < > 138  K 2.6*  < > 6.1*  CL 100*  < > 103  CO2 26  < > 24  GLUCOSE 120*  < > 82  BUN 34*  < > 35*  CREATININE 4.50*  < > 4.35*  CALCIUM 7.9*  < > 8.2*  MG 1.8  --   --   < > = values in this interval not displayed. ASSESSMENT AND PLAN:   * sepsis: Fungemia with Candida parapsilosis blood cultures 7/15, and again on repeat culture 7/17 + and 7/21 + Appreciate infectious disease consultation.  continue PO fluconazole 400 mg PO for total 4 weeks from first negative( 01/05/15) bcx per Dr. Ola Spurr.  * Leukocytosis. Worse, follow-up CBC.  - Port-A-Cath removed on 7/21 by Dr. Lucky Cowboy. 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 Juleen China.  Waiting for placement arrangement by CSW. He need to be able to sit for hemodialysis.  * Anemia of chronic disease.  no active bleeding,  s/p total 8 unit of PRBC,  continue epogen 14000 units IV with HD.  last transfusion was on 01/20/15- transfused 2 units.    * thrombocytopenia   As also have Low Hb   Called hematology consult.  *  Mantle cell lymphoma -recvd Neupogen, Prognosis is poor.-followed by Dr Mike Gip -at present given overall pt's decline and varioius infections not sure if pt is even a candidate for further Chemotherapy - further mgmt per Onco  * Chronic ileus with h/o GI bleeding: Clinically improving. No further bleeding. NG tube and rectal tube discontinued 7/12 - Continue PPI, high risk for further bleeding due to thrombocytopenia - Last seen by GI on July 4. No further plans for endoscopy. - tolerating diet now.  * deconditioning: Unable to participate in physical therapy due to poor motivation and severe deconditioning. PT to see if he improves  * Severe protein calorie malnutrition: Continue Marinol.  Dr. Manuella Ghazi discussed with family and the patient they would not be interested in PEG tube placement. pt's calorie count now stopped due to severe poor po intake. Encourage intake.  * encephalopathy: Appreciate neurology consultation. This is likely encephalopathy/delirium due to critical illness. Agree with when necessary Geodon for agitation. Has not been agitated  improved to baseline.  * pressure ulcers: Wound care.  * left basilic vein superficial extensive thrombosis On eliquis 2.5 mg bid and repeat US upper extremity shows Persistent superficial venous thrombosis noted in the basilic vein.  * Hypokalemia. Improved. With Potassium supplement- he actually had hyperkalemia on 01/20/15- stopped supplement.[name]   CODE STATUS: DO NOT RESUSCITATE  Case discussed with Care Management/Social Worker. Management plans discussed with the patient,   Physical therapy was not able to sit up for greater than one minute on the side of the bed without max assist. He could to sit up for more than one hour during dialysis just now. If patient can continue to sit up during dialysis, he will be discharged to skilled nursing facility this coming week. Will be a very challenging case with long term stay and difficult  discharge.  PT evaluation suggested skilled nursing facility placement. Per CM, may need out of state placement.  TOTAL TIME TAKING CARE OF THIS PATIENT: 30 minutes.   More than 50% of the time was spent in counseling/coordination of care.   Vaughan Basta M.D on 01/21/2015 at 8:40 AM  Between 7am to 6pm - Pager - [phone]  After 6pm go to [email or web address] - password [email or web address] [facility]  [place] [facility]  Office  [phone]  CC: Primary care physician; No PCP Per Patient

## 2015-01-22 LAB — PHOSPHORUS: Phosphorus: 1.6 mg/dL — ABNORMAL LOW (ref 2.5–4.6)

## 2015-01-22 LAB — CBC
HEMATOCRIT: 23.1 % — AB (ref 40.0–52.0)
Hemoglobin: 7.5 g/dL — ABNORMAL LOW (ref 13.0–18.0)
MCH: 29.3 pg (ref 26.0–34.0)
MCHC: 32.7 g/dL (ref 32.0–36.0)
MCV: 89.7 fL (ref 80.0–100.0)
Platelets: 18 10*3/uL — CL (ref 150–440)
RBC: 2.57 MIL/uL — ABNORMAL LOW (ref 4.40–5.90)
RDW: 18.2 % — ABNORMAL HIGH (ref 11.5–14.5)
WBC: 11.9 10*3/uL — ABNORMAL HIGH (ref 3.8–10.6)

## 2015-01-22 LAB — URIC ACID: Uric Acid, Serum: 4.1 mg/dL — ABNORMAL LOW (ref 4.4–7.6)

## 2015-01-22 LAB — PROTIME-INR
INR: 1.89
Prothrombin Time: 21.9 seconds — ABNORMAL HIGH (ref 11.4–15.0)

## 2015-01-22 LAB — VITAMIN B12: Vitamin B-12: 969 pg/mL — ABNORMAL HIGH (ref 180–914)

## 2015-01-22 LAB — RENAL FUNCTION PANEL
ALBUMIN: 1.9 g/dL — AB (ref 3.5–5.0)
ANION GAP: 14 (ref 5–15)
BUN: 25 mg/dL — ABNORMAL HIGH (ref 6–20)
CO2: 24 mmol/L (ref 22–32)
Calcium: 7.5 mg/dL — ABNORMAL LOW (ref 8.9–10.3)
Chloride: 96 mmol/L — ABNORMAL LOW (ref 101–111)
Creatinine, Ser: 3.7 mg/dL — ABNORMAL HIGH (ref 0.61–1.24)
GFR calc non Af Amer: 16 mL/min — ABNORMAL LOW (ref >60)
GFR, EST AFRICAN AMERICAN: 18 mL/min — AB (ref >60)
GLUCOSE: 125 mg/dL — AB (ref 65–99)
POTASSIUM: 4.3 mmol/L (ref 3.5–5.1)
Phosphorus: 1.6 mg/dL — ABNORMAL LOW (ref 2.5–4.6)
SODIUM: 134 mmol/L — AB (ref 135–145)

## 2015-01-22 LAB — RETICULOCYTES
RBC.: 2.57 MIL/uL — ABNORMAL LOW (ref 4.40–5.90)
Retic Count, Absolute: 69.4 10*3/uL (ref 19.0–183.0)
Retic Ct Pct: 2.7 % (ref 0.4–3.1)

## 2015-01-22 LAB — FOLATE: Folate: 6.2 ng/mL (ref >5.9)

## 2015-01-22 LAB — LACTATE DEHYDROGENASE: LDH: 506 U/L — ABNORMAL HIGH (ref 98–192)

## 2015-01-22 LAB — FIBRINOGEN: Fibrinogen: 264 mg/dL (ref 210–470)

## 2015-01-22 MED ORDER — DEXTROSE 5 % IV SOLN
Status: DC
  Filled 2015-01-22 (×2): qty 1500

## 2015-01-22 MED ORDER — DRONABINOL 2.5 MG PO CAPS
2.5000 mg | ORAL_CAPSULE | Freq: Two times a day (BID) | ORAL | Status: DC
  Administered 2015-01-23 – 2015-01-24 (×4): 2.5 mg via ORAL
  Filled 2015-01-22 (×4): qty 1

## 2015-01-22 MED ORDER — ANTICOAGULANT SODIUM CITRATE 4% (200MG/5ML) IV SOLN
5.0000 mL | Status: DC | PRN
  Filled 2015-01-22: qty 250

## 2015-01-22 MED ORDER — POTASSIUM & SODIUM PHOSPHATES 280-160-250 MG PO PACK
1.0000 | PACK | Freq: Three times a day (TID) | ORAL | Status: DC
  Administered 2015-01-22 – 2015-01-23 (×2): 1 via ORAL
  Filled 2015-01-22 (×6): qty 1

## 2015-01-22 NOTE — Consult Note (Signed)
Palliative Care Update  I am aware that another consult has been placed for Palliative Care.  Please refer to my last note, dated 01/10/15.   I have spoken with Dr. Mike Gip briefly and will update patient's attending and other members of the care team.  Kirby Funk, MD

## 2015-01-22 NOTE — Care Management Important Message (Signed)
Important Message  Patient Details  Name: Caleb Francis MRN: [id number] Date of Birth: 07-14-1945   Medicare Important Message Given:  Yes-fourth notification given    [name] 01/22/2015, 11:48 AM

## 2015-01-22 NOTE — Progress Notes (Signed)
   01/22/15 1547  Vitals  Temp 97.7 F (36.5 C)   Spoke with Dr. Darvin Neighbours about pt's current low BP.  [name] BP has been trending low and MD aware.  No new orders at this time.  Pt is resting comfortably in bed with no complaint.  Clarise Cruz, RN

## 2015-01-22 NOTE — Progress Notes (Signed)
[place] at [place] NAME: Caleb Francis    MR#:  [id number]  DATE OF BIRTH:  1945-10-13  SUBJECTIVE:  Still poor oral intake.  No complains. Seen during HD   REVIEW OF SYSTEMS:   Review of Systems  Constitutional: Positive for malaise/fatigue. Negative for fever, chills and weight loss.  HENT: Negative for ear discharge, ear pain and nosebleeds.   Eyes: Negative for blurred vision, pain and discharge.  Respiratory: Negative for sputum production, shortness of breath, wheezing and stridor.   Cardiovascular: Negative for chest pain, palpitations, orthopnea and PND.  Gastrointestinal: Negative for nausea, vomiting, abdominal pain and diarrhea.  Genitourinary: Negative for urgency and frequency.  Musculoskeletal: Negative for back pain and joint pain.  Neurological: Positive for weakness. Negative for sensory change, speech change and focal weakness.  Psychiatric/Behavioral: Positive for depression. The patient is not nervous/anxious.      DRUG ALLERGIES:  No Known Allergies VITALS:  Blood pressure 72/48, pulse 92, temperature 98.1 F (36.7 C), temperature source Oral, resp. rate 23, height 6' (1.829 m), weight 70.2 kg (154 lb 12.2 oz), SpO2 100 %. PHYSICAL EXAMINATION:   Physical Exam GENERAL:  69 y.o.-year-old patient lying in the bed with no acute distress. Appears chronically ill and the lethargic. Sitting in the chair during dialysis just now. EYES: Pupils equal, round, reactive to light and accommodation. No scleral icterus. Extraocular muscles intact. Conjunctiva pale. HEENT: Head atraumatic, normocephalic. Oropharynx and nasopharynx clear.  NECK:  Supple, no jugular venous distention. No thyroid enlargement, no tenderness.  LUNGS: Normal breath sounds bilaterally, no wheezing, rales, rhonchi. No use of accessory muscles of respiration.  CARDIOVASCULAR: S1, S2 normal. No murmurs, rubs, or gallops.  ABDOMEN: Soft,  nontender, mild distended. Bowel sounds present. No organomegaly or mass.  EXTREMITIES: No cyanosis, clubbing or edema b/l.  NEUROLOGIC: Cranial nerves II through XII are intact. No focal Motor or sensory deficits b/l.  Weak,severe deconditioning PSYCHIATRIC:  patient is alert and oriented x 3.  SKIN: stage II sacral ulcer.  LABORATORY PANEL:   CBC  Recent Labs Lab 01/22/15 1017  WBC 11.9*  HGB 7.5*  HCT 23.1*  PLT 18*    Chemistries   Recent Labs Lab 01/22/15 1019  NA 134*  K 4.3  CL 96*  CO2 24  GLUCOSE 125*  BUN 25*  CREATININE 3.70*  CALCIUM 7.5*   ASSESSMENT AND PLAN:   * sepsis: Fungemia with Candida parapsilosis blood cultures 7/15, and again on repeat culture 7/17 + and 7/21  Appreciate infectious disease consultation.  continue PO fluconazole 400 mg PO for total 4 weeks from first negative( 01/05/15) bcx per Dr. Ola Spurr.  * Leukocytosis. Worse, follow-up CBC.  - Port-A-Cath removed on 7/21 by Dr. Lucky Cowboy. Cath tip grew Klebsiella and Pseudomonas (resulted on 7/27) which is likely not significant per ID so not recommending any abx for same unless clinical condition changes and blood c/s turns +  * acute renal failure due to ATN,  progressed to ESRD s/p permcath on 7/29, continue HD on M/W/F  Waiting for placement arrangement by CSW. He need to be able to sit for hemodialysis.  * Anemia of chronic disease.  no active bleeding,  s/p total 8 unit of PRBC,  continue epogen 14000 units IV with HD.  last transfusion was on 01/20/15- transfused 2 units.    * thrombocytopenia   Due to mantle cell lymphoma  * Mantle cell lymphoma -recvd Neupogen, Prognosis is poor.-followed by  Dr Mike Gip -Not a candidate for Chemotherapy  * Chronic ileus with h/o GI bleeding Improved  * Severe protein calorie malnutrition: Continue Marinol.  Dr. Manuella Ghazi discussed with family and the patient they would not be interested in PEG tube placement. pt's calorie count now stopped due to  severe poor po intake. Encourage intake.  * Encephalopathy: Appreciate neurology consultation. This is likely encephalopathy/delirium due to critical illness. Agree with when necessary Geodon for agitation. Has not been agitated  improved to baseline.  * pressure ulcers: Wound care.  * left basilic vein superficial extensive thrombosis Was On eliquis 2.5 mg bid. Stopped due to thrombocytopenia.  CODE STATUS: DO NOT RESUSCITATE  Case discussed with Care Management/Social Worker. Management plans discussed with the patient,   TOTAL TIME TAKING CARE OF THIS PATIENT: 30 minutes.     Hillary Bow R M.D on 01/22/2015 at 1:20 PM  Between 7am to 6pm - Pager - [phone]  After 6pm go to [email or web address] - password [email or web address] [facility]  [place] [facility]  Office  [phone]  CC: Primary care physician; No PCP Per Patient

## 2015-01-22 NOTE — Progress Notes (Signed)
Subjective:   Patient seen during dialysis treatment. He is dialyzing in a chair. He continues to be 2 person assist. His appetite is poor..   Objective:  Vital signs in last 24 hours:  Temp:  [97.8 F (36.6 C)-98.3 F (36.8 C)] 98.1 F (36.7 C) (08/10 0945) Pulse Rate:  [72-102] 94 (08/10 1100) Resp:  [16-33] 28 (08/10 1130) BP: (71-97)/(47-65) 79/52 mmHg (08/10 1130) SpO2:  [98 %-100 %] 100 % (08/10 1000) Weight:  [69.763 kg (153 lb 12.8 oz)-70.2 kg (154 lb 12.2 oz)] 70.2 kg (154 lb 12.2 oz) (08/10 0945)  Weight change: 0.544 kg (1 lb 3.2 oz) Filed Weights   01/21/15 0500 01/22/15 0500 01/22/15 0945  Weight: 69.219 kg (152 lb 9.6 oz) 69.763 kg (153 lb 12.8 oz) 70.2 kg (154 lb 12.2 oz)    Intake/Output: I/O last 3 completed shifts: In: -  Out: 150 [Urine:150]   Intake/Output this shift:     Physical Exam: General: chronically ill appearing  Head: oral mucosa moist  Eyes: Anicteric  Neck: Supple, trachea midline  Lungs:  Clear to auscultation normal effort  Heart: S1S2 no rubs  Abdomen:  Soft, BS present, distended   Extremities: No LE edema  Neurologic: Awake, alert, oriented today  Skin: No acute rashes  Access: LIJ permcath  01/10/15 Dr. Lucky Cowboy    Basic Metabolic Panel:  Recent Labs Lab 01/16/15 5732 01/17/15 0950 01/20/15 0826 01/21/15 2006 01/22/15 1017 01/22/15 1019  NA 138 136 138 134*  --  134*  K 3.0* 3.5 6.1* 4.2  --  4.3  CL 99* 98* 103 96*  --  96*  CO2 27 26 24 25   --  24  GLUCOSE 113* 90 82 115*  --  125*  BUN 21* 28* 35* 21*  --  25*  CREATININE 3.09* 3.90* 4.35* 3.29*  --  3.70*  CALCIUM 7.8* 7.8* 8.2* 7.6*  --  7.5*  PHOS  --  1.4*  --  <1.0* 1.6* 1.6*    Liver Function Tests:  Recent Labs Lab 01/17/15 0950 01/21/15 2006 01/22/15 1019  ALBUMIN 1.9* 1.9* 1.9*   No results for input(s): LIPASE, AMYLASE in the last 168 hours. No results for input(s): AMMONIA in the last 168 hours.  CBC:  Recent Labs Lab 01/18/15 0543  01/20/15 0826 01/21/15 1232 01/21/15 2006 01/22/15 1017  WBC 13.0* 13.2* 11.3* 12.3* 11.9*  HGB 6.6* 6.5* 8.1* 8.0* 7.5*  HCT 19.3* 19.7* 24.3* 24.5* 23.1*  MCV 89.9 91.7 89.0 88.7 89.7  PLT 40* 31* 26* 24* 18*    Cardiac Enzymes: No results for input(s): CKTOTAL, CKMB, CKMBINDEX, TROPONINI in the last 168 hours.  BNP: Invalid input(s): POCBNP  CBG: No results for input(s): GLUCAP in the last 168 hours.  Microbiology: Results for orders placed or performed during the hospital encounter of 11/29/14  MRSA PCR Screening     Status: None   Collection Time: 11/29/14 12:13 PM  Result Value Ref Range Status   MRSA by PCR NEGATIVE NEGATIVE Final    Comment:        The GeneXpert MRSA Assay (FDA approved for NASAL specimens only), is one component of a comprehensive MRSA colonization surveillance program. It is not intended to diagnose MRSA infection nor to guide or monitor treatment for MRSA infections.   Culture, blood (routine x 2)     Status: None   Collection Time: 11/29/14  1:19 PM  Result Value Ref Range Status   Specimen Description BLOOD  Final  Special Requests Immunocompromised  Final   Culture NO GROWTH 5 DAYS  Final   Report Status 12/04/2014 FINAL  Final  Culture, blood (routine x 2)     Status: None   Collection Time: 11/29/14  3:04 PM  Result Value Ref Range Status   Specimen Description Blood  Final   Special Requests Immunocompromised  Final   Report Status 01/03/2015 FINAL  Final  Urine culture     Status: None   Collection Time: 11/30/14 10:12 AM  Result Value Ref Range Status   Specimen Description URINE, CATHETERIZED  Final   Special Requests Immunocompromised  Final   Culture NO GROWTH 2 DAYS  Final   Report Status 12/02/2014 FINAL  Final  Culture, blood (routine x 2)     Status: None   Collection Time: 12/06/14  8:08 PM  Result Value Ref Range Status   Specimen Description BLOOD  Final   Special Requests NONE  Final   Culture  Setup Time    Final    GRAM POSITIVE COCCI IN BOTH AEROBIC AND ANAEROBIC BOTTLES CRITICAL RESULT CALLED TO, READ BACK BY AND VERIFIED WITH: JENNIFER BEZARD AT 9509 12/08/14.PMH CONFIRMED BY RWW    Culture   Final    STAPHYLOCOCCUS AURICULARIS IN BOTH AEROBIC AND ANAEROBIC BOTTLES    Report Status 12/11/2014 FINAL  Final   Organism ID, Bacteria STAPHYLOCOCCUS AURICULARIS  Final      Susceptibility   Staphylococcus auricularis - MIC*    CIPROFLOXACIN >=8 RESISTANT Resistant     ERYTHROMYCIN >=8 RESISTANT Resistant     GENTAMICIN <=0.5 SENSITIVE Sensitive     OXACILLIN >=4 RESISTANT Resistant     TETRACYCLINE <=1 SENSITIVE Sensitive     VANCOMYCIN 1 SENSITIVE Sensitive     CLINDAMYCIN <=0.25 SENSITIVE Sensitive     TRIMETH/SULFA Value in next row Sensitive      SENSITIVE<=20    LEVOFLOXACIN Value in next row Intermediate      INTERMEDIATE4    * STAPHYLOCOCCUS AURICULARIS  Culture, blood (routine x 2)     Status: None   Collection Time: 12/06/14  8:40 PM  Result Value Ref Range Status   Specimen Description BLOOD  Final   Special Requests NONE  Final   Culture NO GROWTH 5 DAYS  Final   Report Status 12/11/2014 FINAL  Final  Culture, blood (routine x 2)     Status: None   Collection Time: 12/08/14 11:40 AM  Result Value Ref Range Status   Specimen Description BLOOD  Final   Special Requests Normal  Final   Culture NO GROWTH 6 DAYS  Final   Report Status 12/14/2014 FINAL  Final  Culture, blood (routine x 2)     Status: None   Collection Time: 12/08/14 11:49 AM  Result Value Ref Range Status   Specimen Description BLOOD  Final   Special Requests Normal  Final   Culture NO GROWTH 6 DAYS  Final   Report Status 12/14/2014 FINAL  Final  C difficile quick scan w PCR reflex ([facility] only)     Status: None   Collection Time: 12/14/14 10:11 AM  Result Value Ref Range Status   C Diff antigen NEGATIVE  Final   C Diff toxin NEGATIVE  Final   C Diff interpretation Negative for C. difficile  Final   Culture, blood (routine x 2)     Status: None   Collection Time: 12/27/14 11:28 AM  Result Value Ref Range Status   Specimen Description BLOOD  Final   Special Requests Immunocompromised  Final   Culture  Setup Time   Final    YEAST AEROBIC BOTTLE ONLY CRITICAL RESULT CALLED TO, READ BACK BY AND VERIFIED WITH: CATHY SUMMERLIN ON 12/29/14 AT 1000AM BY JEF    Culture CANDIDA PARAPSILOSIS AEROBIC BOTTLE ONLY   Final   Report Status 01/02/2015 FINAL  Final  Culture, blood (routine x 2)     Status: None   Collection Time: 12/27/14 11:36 AM  Result Value Ref Range Status   Specimen Description BLOOD  Final   Special Requests Immunocompromised  Final   Culture  Setup Time   Final    YEAST AEROBIC BOTTLE ONLY CRITICAL RESULT CALLED TO, READ BACK BY AND VERIFIED WITH: LISA ROMERO AT 0111 ON 12/29/14 RWW CONFIRMED BY [place]    Culture CANDIDA PARAPSILOSIS AEROBIC BOTTLE ONLY   Final   Report Status 01/02/2015 FINAL  Final  Cath Tip Culture     Status: None   Collection Time: 12/27/14  4:17 PM  Result Value Ref Range Status   Specimen Description CATH TIP  Final   Special Requests NONE  Final   Culture NO GROWTH 2 DAYS  Final   Report Status 12/30/2014 FINAL  Final  Culture, blood (routine x 2)     Status: None   Collection Time: 12/29/14 12:28 PM  Result Value Ref Range Status   Specimen Description BLOOD RIGHT ARM  Final   Special Requests   Final    BOTTLES DRAWN AEROBIC AND ANAEROBIC  AER 4CC ANA 3CC   Culture  Setup Time   Final    BUDDING YEAST SEEN AEROBIC BOTTLE ONLY CRITICAL RESULT CALLED TO, READ BACK BY AND VERIFIED WITH: BROKE ROBERTSON AT 2230 01/01/15.TSH CONFIRMED BY SDR    Culture CANDIDA PARAPSILOSIS AEROBIC BOTTLE ONLY   Final   Report Status 01/04/2015 FINAL  Final  Culture, blood (routine x 2)     Status: None   Collection Time: 12/29/14 12:28 PM  Result Value Ref Range Status   Specimen Description BLOOD RIGHT FATTY CASTS  Final   Special Requests BOTTLES  DRAWN AEROBIC AND ANAEROBIC  5CC  Final   Culture  Setup Time   Final    YEAST AEROBIC BOTTLE ONLY CRITICAL VALUE NOTED.  VALUE IS CONSISTENT WITH PREVIOUSLY REPORTED AND CALLED VALUE.    Culture CANDIDA PARAPSILOSIS  Final   Report Status 01/04/2015 FINAL  Final  Cath Tip Culture     Status: None   Collection Time: 01/02/15 12:42 PM  Result Value Ref Range Status   Specimen Description CATH TIP  Final   Special Requests Normal  Final   Culture NO GROWTH 3 DAYS  Final   Report Status 01/05/2015 FINAL  Final  Culture, blood (routine x 2)     Status: None   Collection Time: 01/02/15  6:34 PM  Result Value Ref Range Status   Specimen Description BLOOD RIGHT HAND  Final   Special Requests 5 BOTTLES DRAWN AEROBIC AND ANAEROBIC 5ML  Final   Culture  Setup Time   Final    BUDDING YEAST SEEN ANAEROBIC BOTTLE ONLY CRITICAL RESULT CALLED TO, READ BACK BY AND VERIFIED WITH: IRIS GUIDRY AT 2030 01/04/15 SDR  CONFIRMED BY RW    Culture CANDIDA PARAPSILOSIS  Final   Report Status 01/07/2015 FINAL  Final  Culture, blood (routine x 2)     Status: None   Collection Time: 01/05/15  8:59 AM  Result Value Ref Range Status  Specimen Description BLOOD RIGHT ASSIST CONTROL  Final   Special Requests BOTTLES DRAWN AEROBIC AND ANAEROBIC 1ML  Final   Culture NO GROWTH 5 DAYS  Final   Report Status 01/10/2015 FINAL  Final  Culture, blood (routine x 2)     Status: None   Collection Time: 01/05/15  9:10 AM  Result Value Ref Range Status   Specimen Description BLOOD LEFT HAND  Final   Special Requests BOTTLES DRAWN AEROBIC AND ANAEROBIC 1ML  Final   Culture NO GROWTH 5 DAYS  Final   Report Status 01/10/2015 FINAL  Final  Cath Tip Culture     Status: None   Collection Time: 01/05/15 11:30 AM  Result Value Ref Range Status   Specimen Description CATH TIP  Final   Special Requests NONE  Final   Culture PSEUDOMONAS AERUGINOSA KLEBSIELLA OXYTOCA   Final   Report Status 01/08/2015 FINAL  Final    Organism ID, Bacteria PSEUDOMONAS AERUGINOSA  Final   Organism ID, Bacteria KLEBSIELLA OXYTOCA  Final      Susceptibility   Klebsiella oxytoca - MIC*    AMPICILLIN >=32 RESISTANT Resistant     CEFTAZIDIME <=1 SENSITIVE Sensitive     CEFAZOLIN >=64 RESISTANT Resistant     CEFTRIAXONE 8 SENSITIVE Sensitive     CIPROFLOXACIN <=0.25 SENSITIVE Sensitive     GENTAMICIN <=1 SENSITIVE Sensitive     IMIPENEM <=0.25 SENSITIVE Sensitive     TRIMETH/SULFA <=20 SENSITIVE Sensitive     PIP/TAZO Value in next row Resistant      RESISTANT>=128    * KLEBSIELLA OXYTOCA   Pseudomonas aeruginosa - MIC*    CEFTAZIDIME Value in next row Sensitive      RESISTANT>=128    CIPROFLOXACIN Value in next row Resistant      RESISTANT>=128    GENTAMICIN Value in next row Sensitive      RESISTANT>=128    IMIPENEM Value in next row Resistant      RESISTANT>=128    PIP/TAZO Value in next row Sensitive      SENSITIVE8    * PSEUDOMONAS AERUGINOSA    Coagulation Studies:  Recent Labs  01/22/15 1017  LABPROT 21.9*  INR 1.89    Urinalysis: No results for input(s): COLORURINE, LABSPEC, PHURINE, GLUCOSEU, HGBUR, BILIRUBINUR, KETONESUR, PROTEINUR, UROBILINOGEN, NITRITE, LEUKOCYTESUR in the last 72 hours.  Invalid input(s): APPERANCEUR    Imaging: No results found.   Medications:     . amiodarone  200 mg Oral Daily  . antiseptic oral rinse  7 mL Mouth Rinse BID  . dronabinol  2.5 mg Oral BID AC  . epoetin (EPOGEN/PROCRIT) injection  10,000 Units Intravenous Q M,W,F-HD  . feeding supplement (NEPRO CARB STEADY)  237 mL Oral BID BM  . fluconazole  200 mg Oral Once per day on Sun Tue Thu Sat  . fluconazole  400 mg Oral Once per day on Mon Wed Fri  . mometasone-formoterol  2 puff Inhalation BID  . pantoprazole  40 mg Oral BID  . scopolamine  1 patch Transdermal Q72H  . sodium chloride  3 mL Intravenous Q12H   sodium chloride, sodium chloride, acetaminophen **OR** acetaminophen, albuterol, heparin,  HYDROcodone-acetaminophen, lidocaine (PF), lidocaine-prilocaine, magnesium hydroxide, metoprolol, ondansetron (ZOFRAN) IV, promethazine  Assessment/ Plan:  Caleb Francis is a 51 black male with progressive mantle cell lymphoma, RICE chemo in 11/2014, hx left sided hydronephrosis and hydroureter. S/p ureteral stent placement   Hospital course complicated by sepsis with Klebsiella oxytoca, hypernatremia. Now with fungemia.  1. End Stage Renal Failure:  Acute renal failure due to ATN and now without recovery.  - Patient seen during dialysis Tolerating well   2.  Sepsis with fungemia. A41.9, B49. Status post bacteremia with klebsiella oxytoca: completed two weeks for IV ceftazidime. Subsequently growing candida parapsilosis.  Subcutaneous port removed 01/02/15. Catheter tip culture from July 24 is showing Pseudomonas, Klebsiella and Candida - Currently on fluconazole for 4 weeks. End on 8/21.   3.  Anemia of chronic kidney disease/mantle cell lymphoma: not candidate for chemo per Hem/onc. Hospice suggested - status post transfusion on 7/27, 7/30, 8/3, 8/7 - continue epogen  IV with HD, use cleared by hematology/onc.  - Continue to monitor hemoglobin closely. Transfuse as necessary.   - Also noted to have thrombocytopenia - hematology assessment ongoing - No heparin during dialysis. We will pack the catheter with sodium citrate.  4. Secondary Hyperparathyroidism: PTH 71. Phos 1.6 - continue to monitor   LOS: [place] 8/10/201611:40 AM

## 2015-01-22 NOTE — Plan of Care (Signed)
Problem: Discharge Progression Outcomes Goal: Other Discharge Outcomes/Goals Outcome: Progressing Plan of care progress to goal: VSS stable.  BP still runs low as per trend. Diet - poor appetite

## 2015-01-22 NOTE — Progress Notes (Signed)
PT Cancellation Note  Patient Details Name: Caleb Francis MRN: [id number] DOB: 09/17/1945   Cancelled Treatment:    Reason Eval/Treat Not Completed: Patient at procedure or test/unavailable. Pt in hemodialysis a.m and currently. Re attempt treatment tomorrow.    [place] 01/22/2015, 1:58 PM

## 2015-01-22 NOTE — Plan of Care (Signed)
Problem: Discharge Progression Outcomes Goal: Other Discharge Outcomes/Goals Outcome: Progressing Plan of care progress to goal for: 1. Pain-pt c/o pain in back X1, prn meds given with improvement 2. Hemodynamically-             -VSS, pt remains afebrile 3. Complications-BP remains low, MD aware, no new orders this shift 4. Diet-Pt refusing to eat, but did drink mighty shake this morning 5. Activity-pt up in wheelchair to go to dialysis

## 2015-01-22 NOTE — Progress Notes (Signed)
PRE HD   

## 2015-01-23 LAB — CBC
HEMATOCRIT: 23.3 % — AB (ref 40.0–52.0)
Hemoglobin: 7.7 g/dL — ABNORMAL LOW (ref 13.0–18.0)
MCH: 29.2 pg (ref 26.0–34.0)
MCHC: 32.9 g/dL (ref 32.0–36.0)
MCV: 88.8 fL (ref 80.0–100.0)
Platelets: 15 10*3/uL — CL (ref 150–400)
RBC: 2.63 MIL/uL — ABNORMAL LOW (ref 4.40–5.90)
RDW: 18 % — ABNORMAL HIGH (ref 11.5–14.5)
WBC: 12 10*3/uL — AB (ref 3.8–10.6)

## 2015-01-23 LAB — BASIC METABOLIC PANEL
Anion gap: 13 (ref 5–15)
BUN: 15 mg/dL (ref 6–20)
CALCIUM: 7.7 mg/dL — AB (ref 8.9–10.3)
CO2: 28 mmol/L (ref 22–32)
CREATININE: 2.58 mg/dL — AB (ref 0.61–1.24)
Chloride: 97 mmol/L — ABNORMAL LOW (ref 101–111)
GFR calc Af Amer: 28 mL/min — ABNORMAL LOW (ref >60)
GFR, EST NON AFRICAN AMERICAN: 24 mL/min — AB (ref >60)
GLUCOSE: 69 mg/dL (ref 65–99)
Potassium: 3.9 mmol/L (ref 3.5–5.1)
Sodium: 138 mmol/L (ref 135–145)

## 2015-01-23 LAB — PHOSPHORUS: Phosphorus: 1.4 mg/dL — ABNORMAL LOW (ref 2.5–4.6)

## 2015-01-23 LAB — HEPARIN INDUCED PLATELET AB (HIT ANTIBODY): Heparin Induced Plt Ab: 0.156 OD (ref 0.000–0.400)

## 2015-01-23 MED ORDER — SODIUM CHLORIDE 0.9 % IJ SOLN: 10.0000 mL | INTRAMUSCULAR | Status: DC | PRN

## 2015-01-23 MED ORDER — DIPHENHYDRAMINE HCL 25 MG PO CAPS
25.0000 mg | ORAL_CAPSULE | Freq: Once | ORAL | Status: AC
  Administered 2015-01-24: 25 mg via ORAL
  Filled 2015-01-23: qty 1

## 2015-01-23 MED ORDER — MIDODRINE HCL 5 MG PO TABS
10.0000 mg | ORAL_TABLET | Freq: Three times a day (TID) | ORAL | Status: DC
  Administered 2015-01-23 – 2015-01-24 (×4): 10 mg via ORAL
  Filled 2015-01-23 (×4): qty 2

## 2015-01-23 MED ORDER — ACETAMINOPHEN 325 MG PO TABS
650.0000 mg | ORAL_TABLET | Freq: Once | ORAL | Status: DC
  Filled 2015-01-23: qty 2

## 2015-01-23 MED ORDER — SODIUM CHLORIDE 0.9 % IV SOLN: 250.0000 mL | Freq: Once | INTRAVENOUS | Status: DC

## 2015-01-23 MED ORDER — SODIUM CHLORIDE 0.9 % IJ SOLN: 3.0000 mL | INTRAMUSCULAR | Status: DC | PRN

## 2015-01-23 MED ORDER — POTASSIUM PHOSPHATES 15 MMOLE/5ML IV SOLN
30.0000 mmol | Freq: Once | INTRAVENOUS | Status: AC
  Administered 2015-01-23: 17:00:00 30 mmol via INTRAVENOUS
  Filled 2015-01-23: qty 10

## 2015-01-23 MED ORDER — POTASSIUM & SODIUM PHOSPHATES 280-160-250 MG PO PACK
1.0000 | PACK | Freq: Two times a day (BID) | ORAL | Status: DC
  Administered 2015-01-23 – 2015-01-24 (×2): 1 via ORAL
  Filled 2015-01-23 (×3): qty 1

## 2015-01-23 NOTE — Plan of Care (Signed)
Problem: Discharge Progression Outcomes Goal: Other Discharge Outcomes/Goals Outcome: Not Progressing Patient had no c/o pain this shift Midodrine give for BP in the 80's Patient is an assist to bedpan, patient sat on side of bed for about 4 mins today  Patients appetite continues to be poor Marinol given as ordered  Platelets order near end of shift to be given tonight  Patient has been set up to start dialysis on 8/15 as an outpatient if everything goes as scheduled

## 2015-01-23 NOTE — Care Management Note (Addendum)
Patients dialysis is set up and ready to start at outpatient clinic on Tuesday 01/27/15 at 11:30. Dr. Candiss Norse has informed me that a discharge with dialysis starting on Saturday 01/25/15 is not safe due to low staffing and the ongoing problem with patient running a low BP and his need for extra care. I have left completed form for patients family to be given to them prior to discharge when that occurs.  Form list address phone number and requirements for 1st treatment.  It has been placed in patients chart until discharge occurs. [name]ia[facility]  [phone]

## 2015-01-23 NOTE — Progress Notes (Signed)
[place] [facility] [place] NAME: Caleb Francis    MR#:  [id number]  DATE OF BIRTH:  09/23/1945  SUBJECTIVE:  Still poor oral intake.  No complains. Seen during HD   REVIEW OF SYSTEMS:   Review of Systems  Constitutional: Positive for malaise/fatigue. Negative for fever, chills and weight loss.  HENT: Negative for ear discharge, ear pain and nosebleeds.   Eyes: Negative for blurred vision, pain and discharge.  Respiratory: Negative for sputum production, shortness of breath, wheezing and stridor.   Cardiovascular: Negative for chest pain, palpitations, orthopnea and PND.  Gastrointestinal: Negative for nausea, vomiting, abdominal pain and diarrhea.  Genitourinary: Negative for urgency and frequency.  Musculoskeletal: Negative for back pain and joint pain.  Neurological: Positive for weakness. Negative for sensory change, speech change and focal weakness.  Psychiatric/Behavioral: Positive for depression. The patient is not nervous/anxious.      DRUG ALLERGIES:  No Known Allergies VITALS:  Blood pressure 91/54, pulse 104, temperature 98.4 F (36.9 C), temperature source Oral, resp. rate 16, height 6' (1.829 m), weight 71.804 kg (158 lb 4.8 oz), SpO2 96 %. PHYSICAL EXAMINATION:   Physical Exam GENERAL:  69 y.o.-year-old patient lying in the bed with no acute distress. Appears chronically ill and the lethargic. Sitting in the chair during dialysis just now. EYES: Pupils equal, round, reactive to light and accommodation. No scleral icterus. Extraocular muscles intact. Conjunctiva pale. HEENT: Head atraumatic, normocephalic. Oropharynx and nasopharynx clear.  NECK:  Supple, no jugular venous distention. No thyroid enlargement, no tenderness.  LUNGS: Normal breath sounds bilaterally, no wheezing, rales, rhonchi. No use of accessory muscles of respiration.  CARDIOVASCULAR: S1, S2 normal. No murmurs, rubs, or gallops.  ABDOMEN: Soft,  nontender, mild distended. Bowel sounds present. No organomegaly or mass.  EXTREMITIES: No cyanosis, clubbing or edema b/l.  NEUROLOGIC: Cranial nerves II through XII are intact. No focal Motor or sensory deficits b/l.  Weak,severe deconditioning PSYCHIATRIC:  patient is alert and oriented x 3.  SKIN: stage II sacral ulcer.  LABORATORY PANEL:   CBC  Recent Labs Lab 01/23/15 0510  WBC 12.0*  HGB 7.7*  HCT 23.3*  PLT 15*    Chemistries   Recent Labs Lab 01/23/15 0510  NA 138  K 3.9  CL 97*  CO2 28  GLUCOSE 69  BUN 15  CREATININE 2.58*  CALCIUM 7.7*   ASSESSMENT AND PLAN:   * Mantle cell lymphoma Worsening.  Discussed with Dr. Nydia Bouton and she feels patient is not a candidate for chemotherapy or BM transplant.  * sepsis: Fungemia with Candida parapsilosis blood cultures 7/15, and again on repeat culture 7/17 + and 7/21  Appreciate infectious disease consultation.  continue PO fluconazole 400 mg PO for total 4 weeks from first negative( 01/05/15) bcx per Dr. Ola Spurr.  * Leukocytosis. Worse, follow-up CBC.  - Port-A-Cath removed on 7/21 by Dr. Lucky Cowboy. Cath tip grew Klebsiella and Pseudomonas (resulted on 7/27) which is likely not significant per ID so not recommending any abx for same unless clinical condition changes and blood c/s turns +  * acute renal failure due to ATN,  progressed to ESRD s/p permcath on 7/29, continue HD on M/W/F  Waiting for placement arrangement by CSW. He need to be able to sit for hemodialysis.  * Anemia of chronic disease.  no active bleeding,  s/p total 8 unit of PRBC,  continue epogen 14000 units IV with HD.  last transfusion was on 01/20/15- transfused 2  units.    * thrombocytopenia - worsening   Due to mantle cell lymphoma  * Chronic ileus with h/o GI bleeding Improved  * Severe protein calorie malnutrition: Continue Marinol.  Dr. Manuella Ghazi discussed with family and the patient they would not be interested in PEG tube placement.   *  Encephalopathy: Appreciate neurology consultation. This is likely encephalopathy/delirium due to critical illness. Agree with when necessary Geodon for agitation. Has not been agitated  improved to baseline.  * pressure ulcers: Wound care.  * left basilic vein superficial extensive thrombosis Was On eliquis 2.5 mg bid. Stopped due to thrombocytopenia.  CODE STATUS: DO NOT RESUSCITATE  Case discussed with Care Management/Social Worker. Management plans discussed with the patient,   TOTAL TIME TAKING CARE OF THIS PATIENT: 35 minutes.   Abdomen discussed the case with oncology Dr. Mike Gip. She has been seeing the patient in her office and has communicated with patient and son in the past. She will be discussing with family regarding further options including hospice. Further discharge plan as per her discussion.    Hillary Bow R M.D on 01/23/2015 at 12:06 PM  Between 7am to 6pm - Pager - [phone]  After 6pm go to [email or web address] - password [email or web address] [facility]  [facility]  Office  [phone]  CC: Primary care physician; No PCP Per Patient

## 2015-01-23 NOTE — Progress Notes (Signed)
   01/23/15 1335  Clinical Encounter Type  Visited With Patient not available  Attempted to visit with patient, but patient appeared to be sleeping.  Will try to visit again at another time.  [place] [phone]

## 2015-01-23 NOTE — Progress Notes (Signed)
Palliative Care Update  I have been made aware that some of his care team members are still under the impression that a Palliative Care discussion is to soon take place with me being involved.   My involvement in this particular case would only make things worse.  I am unable to be involved in Palliative Care discussions with this patient or family due to issues detailed in my note from 01/10/15. Son had been extremely angry and somewhat threatening toward me on that date.  I cannot be involved due to this.  Family has consistently resisted all discussion of Hospice or withdrawal of care --ever since he rebounded for a few weeks and his initial transfer to [place] was cancelled and he had a couple of good weeks.    When I was talking with family, they were not able to see that his immune system and other organ systems have been affected by the underlying and unseen effects of the malignancy (immune system failure, thrombosis, anorexia ---all related to the malignancy).  Now, I note that he is declining further.   If family changes their mind (son and daughter and estranged wife) and IF they would again wish for patient to come under the care of Hospice, a DIRECT CONSULT TO [place] ADVISED.  They consistently said he could not go 'home' or to any of their homes.   He would be a candidate for Hospice Home, but they have spoken so badly about this consideration in the past, I doubt they would be inclined to have pt go there. Perhaps a facility would take him as a Hospice Patient, but I doubt family will opt for withdrawal of dialysis, etc, as they have stated in the past that they would never take him off dialysis.   Kirby Funk, MD

## 2015-01-23 NOTE — Progress Notes (Signed)
Dr. Mike Gip called and requested that we wait until the am to transfuse the platelets due to pts past reactions.  I am to call her when the CBC results come in before she makes her final decision.

## 2015-01-23 NOTE — Progress Notes (Signed)
Subjective:  Overall patient's condition is about the same. His appetite remains poor. He states he had breakfast this morning. Blood pressure remains borderline low during dialysis. He drops down to 26Z to 12W systolic. Due to his poor health condition, he is not a candidate for further chemotherapy. Platelet count is now critically low at 15. Hemoglobin keeps trending down to 7.7.   Objective:  Vital signs in last 24 hours:  Temp:  [97.7 F (36.5 C)-98.4 F (36.9 C)] 98.4 F (36.9 C) (08/11 0656) Pulse Rate:  [92-104] 104 (08/11 0917) Resp:  [16-28] 16 (08/11 0656) BP: (63-91)/(47-54) 91/54 mmHg (08/11 0917) SpO2:  [96 %-100 %] 96 % (08/11 0917) Weight:  [71.804 kg (158 lb 4.8 oz)] 71.804 kg (158 lb 4.8 oz) (08/11 5809)  Weight change: 0.437 kg (15.4 oz) Filed Weights   01/22/15 0500 01/22/15 0945 01/23/15 0642  Weight: 69.763 kg (153 lb 12.8 oz) 70.2 kg (154 lb 12.2 oz) 71.804 kg (158 lb 4.8 oz)    Intake/Output: I/O last 3 completed shifts: In: 0  Out: -185 [Urine:200]   Intake/Output this shift:     Physical Exam: General: chronically ill appearing  Head: oral mucosa moist  Eyes: Anicteric  Neck: Supple, trachea midline  Lungs:  Clear to auscultation normal effort  Heart: S1S2 no rubs  Abdomen:  Soft, BS present, distended   Extremities: No LE edema  Neurologic: Awake, alert, oriented today  Skin: No acute rashes  Access: [facility] permcath  01/10/15 Dr. Lucky Cowboy    Basic Metabolic Panel:  Recent Labs Lab 01/17/15 0950 01/20/15 0826 01/21/15 2006 01/22/15 1017 01/22/15 1019 01/23/15 0510  NA 136 138 134*  --  134* 138  K 3.5 6.1* 4.2  --  4.3 3.9  CL 98* 103 96*  --  96* 97*  CO2 26 24 25   --  24 28  GLUCOSE 90 82 115*  --  125* 69  BUN 28* 35* 21*  --  25* 15  CREATININE 3.90* 4.35* 3.29*  --  3.70* 2.58*  CALCIUM 7.8* 8.2* 7.6*  --  7.5* 7.7*  PHOS 1.4*  --  <1.0* 1.6* 1.6* 1.4*    Liver Function Tests:  Recent Labs Lab 01/17/15 0950 01/21/15 2006  01/22/15 1019  ALBUMIN 1.9* 1.9* 1.9*   No results for input(s): LIPASE, AMYLASE in the last 168 hours. No results for input(s): AMMONIA in the last 168 hours.  CBC:  Recent Labs Lab 01/20/15 0826 01/21/15 1232 01/21/15 2006 01/22/15 1017 01/23/15 0510  WBC 13.2* 11.3* 12.3* 11.9* 12.0*  HGB 6.5* 8.1* 8.0* 7.5* 7.7*  HCT 19.7* 24.3* 24.5* 23.1* 23.3*  MCV 91.7 89.0 88.7 89.7 88.8  PLT 31* 26* 24* 18* 15*    Cardiac Enzymes: No results for input(s): CKTOTAL, CKMB, CKMBINDEX, TROPONINI in the last 168 hours.  BNP: Invalid input(s): POCBNP  CBG: No results for input(s): GLUCAP in the last 168 hours.  Microbiology: Results for orders placed or performed during the hospital encounter of 11/29/14  MRSA PCR Screening     Status: None   Collection Time: 11/29/14 12:13 PM  Result Value Ref Range Status   MRSA by PCR NEGATIVE NEGATIVE Final    Comment:        The GeneXpert MRSA Assay (FDA approved for NASAL specimens only), is one component of a comprehensive MRSA colonization surveillance program. It is not intended to diagnose MRSA infection nor to guide or monitor treatment for MRSA infections.   Culture, blood (routine x  2)     Status: None   Collection Time: 11/29/14  1:19 PM  Result Value Ref Range Status   Specimen Description BLOOD  Final   Special Requests Immunocompromised  Final   Culture NO GROWTH 5 DAYS  Final   Report Status 12/04/2014 FINAL  Final  Culture, blood (routine x 2)     Status: None   Collection Time: 11/29/14  3:04 PM  Result Value Ref Range Status   Specimen Description Blood  Final   Special Requests Immunocompromised  Final   Report Status 01/03/2015 FINAL  Final  Urine culture     Status: None   Collection Time: 11/30/14 10:12 AM  Result Value Ref Range Status   Specimen Description URINE, CATHETERIZED  Final   Special Requests Immunocompromised  Final   Culture NO GROWTH 2 DAYS  Final   Report Status 12/02/2014 FINAL  Final   Culture, blood (routine x 2)     Status: None   Collection Time: 12/06/14  8:08 PM  Result Value Ref Range Status   Specimen Description BLOOD  Final   Special Requests NONE  Final   Culture  Setup Time   Final    GRAM POSITIVE COCCI IN BOTH AEROBIC AND ANAEROBIC BOTTLES CRITICAL RESULT CALLED TO, READ BACK BY AND VERIFIED WITH: JENNIFER BEZARD AT 4132 12/08/14.[facility] CONFIRMED BY [facility]    Culture   Final    STAPHYLOCOCCUS AURICULARIS IN BOTH AEROBIC AND ANAEROBIC BOTTLES    Report Status 12/11/2014 FINAL  Final   Organism ID, Bacteria STAPHYLOCOCCUS AURICULARIS  Final      Susceptibility   Staphylococcus auricularis - MIC*    CIPROFLOXACIN >=8 RESISTANT Resistant     ERYTHROMYCIN >=8 RESISTANT Resistant     GENTAMICIN <=0.5 SENSITIVE Sensitive     OXACILLIN >=4 RESISTANT Resistant     TETRACYCLINE <=1 SENSITIVE Sensitive     VANCOMYCIN 1 SENSITIVE Sensitive     CLINDAMYCIN <=0.25 SENSITIVE Sensitive     TRIMETH/SULFA Value in next row Sensitive      SENSITIVE<=20    LEVOFLOXACIN Value in next row Intermediate      INTERMEDIATE4    * STAPHYLOCOCCUS AURICULARIS  Culture, blood (routine x 2)     Status: None   Collection Time: 12/06/14  8:40 PM  Result Value Ref Range Status   Specimen Description BLOOD  Final   Special Requests NONE  Final   Culture NO GROWTH 5 DAYS  Final   Report Status 12/11/2014 FINAL  Final  Culture, blood (routine x 2)     Status: None   Collection Time: 12/08/14 11:40 AM  Result Value Ref Range Status   Specimen Description BLOOD  Final   Special Requests Normal  Final   Culture NO GROWTH 6 DAYS  Final   Report Status 12/14/2014 FINAL  Final  Culture, blood (routine x 2)     Status: None   Collection Time: 12/08/14 11:49 AM  Result Value Ref Range Status   Specimen Description BLOOD  Final   Special Requests Normal  Final   Culture NO GROWTH 6 DAYS  Final   Report Status 12/14/2014 FINAL  Final  C difficile quick scan w PCR reflex ([facility] only)      Status: None   Collection Time: 12/14/14 10:11 AM  Result Value Ref Range Status   C Diff antigen NEGATIVE  Final   C Diff toxin NEGATIVE  Final   C Diff interpretation Negative for C. difficile  Final  Culture, blood (routine x 2)     Status: None   Collection Time: 12/27/14 11:28 AM  Result Value Ref Range Status   Specimen Description BLOOD  Final   Special Requests Immunocompromised  Final   Culture  Setup Time   Final    YEAST AEROBIC BOTTLE ONLY CRITICAL RESULT CALLED TO, READ BACK BY AND VERIFIED WITH: CATHY SUMMERLIN ON 12/29/14 AT 1000AM BY JEF    Culture CANDIDA PARAPSILOSIS AEROBIC BOTTLE ONLY   Final   Report Status 01/02/2015 FINAL  Final  Culture, blood (routine x 2)     Status: None   Collection Time: 12/27/14 11:36 AM  Result Value Ref Range Status   Specimen Description BLOOD  Final   Special Requests Immunocompromised  Final   Culture  Setup Time   Final    YEAST AEROBIC BOTTLE ONLY CRITICAL RESULT CALLED TO, READ BACK BY AND VERIFIED WITH: LISA ROMERO AT 0111 ON 12/29/14 [facility] CONFIRMED BY [place]    Culture CANDIDA PARAPSILOSIS AEROBIC BOTTLE ONLY   Final   Report Status 01/02/2015 FINAL  Final  Cath Tip Culture     Status: None   Collection Time: 12/27/14  4:17 PM  Result Value Ref Range Status   Specimen Description CATH TIP  Final   Special Requests NONE  Final   Culture NO GROWTH 2 DAYS  Final   Report Status 12/30/2014 FINAL  Final  Culture, blood (routine x 2)     Status: None   Collection Time: 12/29/14 12:28 PM  Result Value Ref Range Status   Specimen Description BLOOD RIGHT ARM  Final   Special Requests   Final    BOTTLES DRAWN AEROBIC AND ANAEROBIC  AER 4CC ANA 3CC   Culture  Setup Time   Final    BUDDING YEAST SEEN AEROBIC BOTTLE ONLY CRITICAL RESULT CALLED TO, READ BACK BY AND VERIFIED WITH: BROKE ROBERTSON AT 2230 01/01/15.[facility] CONFIRMED BY SDR    Culture CANDIDA PARAPSILOSIS AEROBIC BOTTLE ONLY   Final   Report Status 01/04/2015 FINAL   Final  Culture, blood (routine x 2)     Status: None   Collection Time: 12/29/14 12:28 PM  Result Value Ref Range Status   Specimen Description BLOOD RIGHT FATTY CASTS  Final   Special Requests BOTTLES DRAWN AEROBIC AND ANAEROBIC  5CC  Final   Culture  Setup Time   Final    YEAST AEROBIC BOTTLE ONLY CRITICAL VALUE NOTED.  VALUE IS CONSISTENT WITH PREVIOUSLY REPORTED AND CALLED VALUE.    Culture CANDIDA PARAPSILOSIS  Final   Report Status 01/04/2015 FINAL  Final  Cath Tip Culture     Status: None   Collection Time: 01/02/15 12:42 PM  Result Value Ref Range Status   Specimen Description CATH TIP  Final   Special Requests Normal  Final   Culture NO GROWTH 3 DAYS  Final   Report Status 01/05/2015 FINAL  Final  Culture, blood (routine x 2)     Status: None   Collection Time: 01/02/15  6:34 PM  Result Value Ref Range Status   Specimen Description BLOOD RIGHT HAND  Final   Special Requests 5 BOTTLES DRAWN AEROBIC AND ANAEROBIC 5ML  Final   Culture  Setup Time   Final    BUDDING YEAST SEEN ANAEROBIC BOTTLE ONLY CRITICAL RESULT CALLED TO, READ BACK BY AND VERIFIED WITH: IRIS GUIDRY AT 2030 01/04/15 SDR  CONFIRMED BY RW    Culture CANDIDA PARAPSILOSIS  Final   Report Status  01/07/2015 FINAL  Final  Culture, blood (routine x 2)     Status: None   Collection Time: 01/05/15  8:59 AM  Result Value Ref Range Status   Specimen Description BLOOD RIGHT ASSIST CONTROL  Final   Special Requests BOTTLES DRAWN AEROBIC AND ANAEROBIC 1ML  Final   Culture NO GROWTH 5 DAYS  Final   Report Status 01/10/2015 FINAL  Final  Culture, blood (routine x 2)     Status: None   Collection Time: 01/05/15  9:10 AM  Result Value Ref Range Status   Specimen Description BLOOD LEFT HAND  Final   Special Requests BOTTLES DRAWN AEROBIC AND ANAEROBIC 1ML  Final   Culture NO GROWTH 5 DAYS  Final   Report Status 01/10/2015 FINAL  Final  Cath Tip Culture     Status: None   Collection Time: 01/05/15 11:30 AM  Result  Value Ref Range Status   Specimen Description CATH TIP  Final   Special Requests NONE  Final   Culture PSEUDOMONAS AERUGINOSA KLEBSIELLA OXYTOCA   Final   Report Status 01/08/2015 FINAL  Final   Organism ID, Bacteria PSEUDOMONAS AERUGINOSA  Final   Organism ID, Bacteria KLEBSIELLA OXYTOCA  Final      Susceptibility   Klebsiella oxytoca - MIC*    AMPICILLIN >=32 RESISTANT Resistant     CEFTAZIDIME <=1 SENSITIVE Sensitive     CEFAZOLIN >=64 RESISTANT Resistant     CEFTRIAXONE 8 SENSITIVE Sensitive     CIPROFLOXACIN <=0.25 SENSITIVE Sensitive     GENTAMICIN <=1 SENSITIVE Sensitive     IMIPENEM <=0.25 SENSITIVE Sensitive     TRIMETH/SULFA <=20 SENSITIVE Sensitive     PIP/TAZO Value in next row Resistant      RESISTANT>=128    * KLEBSIELLA OXYTOCA   Pseudomonas aeruginosa - MIC*    CEFTAZIDIME Value in next row Sensitive      RESISTANT>=128    CIPROFLOXACIN Value in next row Resistant      RESISTANT>=128    GENTAMICIN Value in next row Sensitive      RESISTANT>=128    IMIPENEM Value in next row Resistant      RESISTANT>=128    PIP/TAZO Value in next row Sensitive      SENSITIVE8    * PSEUDOMONAS AERUGINOSA    Coagulation Studies:  Recent Labs  01/22/15 1017  LABPROT 21.9*  INR 1.89    Urinalysis: No results for input(s): COLORURINE, LABSPEC, PHURINE, GLUCOSEU, HGBUR, BILIRUBINUR, KETONESUR, PROTEINUR, UROBILINOGEN, NITRITE, LEUKOCYTESUR in the last 72 hours.  Invalid input(s): APPERANCEUR    Imaging: No results found.   Medications:     . amiodarone  200 mg Oral Daily  . antiseptic oral rinse  7 mL Mouth Rinse BID  . dronabinol  2.5 mg Oral BID AC  . epoetin (EPOGEN/PROCRIT) injection  10,000 Units Intravenous Q M,W,F-HD  . feeding supplement (NEPRO CARB STEADY)  237 mL Oral BID BM  . fluconazole  200 mg Oral Once per day on Sun Tue Thu Sat  . fluconazole  400 mg Oral Once per day on Mon Wed Fri  . mometasone-formoterol  2 puff Inhalation BID  .  pantoprazole  40 mg Oral BID  . potassium & sodium phosphates  1 packet Oral TID WC & HS  . scopolamine  1 patch Transdermal Q72H  . sodium chloride  3 mL Intravenous Q12H   sodium chloride, sodium chloride, acetaminophen **OR** acetaminophen, albuterol, anticoagulant sodium citrate, heparin, HYDROcodone-acetaminophen, lidocaine (PF), lidocaine-prilocaine, magnesium hydroxide, metoprolol,  ondansetron (ZOFRAN) IV, promethazine  Assessment/ Plan:  Mr. Antillon is a 89 black male with progressive mantle cell lymphoma, RICE chemo in 11/2014, hx left sided hydronephrosis and hydroureter. S/p ureteral stent placement   Hospital course complicated by sepsis with Klebsiella oxytoca, hypernatremia. Now with fungemia.   1. End Stage Renal Failure:  Acute renal failure due to ATN and now without recovery.  - Palliative care/discussion for hospice are underway. Patient has multitude of medical issues and terminal cancer. He is not a candidate for chemotherapy. As such, his life expectancy is very short. Primary team is going to discuss hospice with patient's family today. We'll make further decisions about dialysis based on outcome of that discussion with family.  2.  Sepsis with fungemia. A41.9, B49. Status post bacteremia with klebsiella oxytoca: completed two weeks for IV ceftazidime. Subsequently growing candida parapsilosis.  Subcutaneous port removed 01/02/15. Catheter tip culture from July 24 is showing Pseudomonas, Klebsiella and Candida - Currently on fluconazole for 4 weeks. End on 8/21.   3.  Anemia of chronic kidney disease/mantle cell lymphoma: not candidate for chemo per Hem/onc. Hospice suggested - status post transfusion on 7/27, 7/30, 8/3, 8/7 - continue epogen  IV with HD, use cleared by hematology/onc.  - Continue to monitor hemoglobin closely. Transfuse as necessary.   - Also noted to have thrombocytopenia - hematology assessment ongoing - No heparin during dialysis. We will pack the  catheter with sodium citrate.  4. Secondary Hyperparathyroidism: PTH 71. Phos 1.6 - continue to monitor   LOS: 55 Angelynn Lemus 8/11/201611:22 AM

## 2015-01-23 NOTE — Plan of Care (Signed)
Problem: Discharge Progression Outcomes Goal: Other Discharge Outcomes/Goals Outcome: Progressing Plan of care progress to goal: VSS with trending low BP.  MD aware, continue to monitor. Diet - poor appetite Activity- pt remains in bed

## 2015-01-24 LAB — BASIC METABOLIC PANEL
ANION GAP: 12 (ref 5–15)
BUN: 23 mg/dL — ABNORMAL HIGH (ref 6–20)
CO2: 27 mmol/L (ref 22–32)
CREATININE: 3.84 mg/dL — AB (ref 0.61–1.24)
Calcium: 7.7 mg/dL — ABNORMAL LOW (ref 8.9–10.3)
Chloride: 99 mmol/L — ABNORMAL LOW (ref 101–111)
GFR calc Af Amer: 17 mL/min — ABNORMAL LOW (ref >60)
GFR calc non Af Amer: 15 mL/min — ABNORMAL LOW (ref >60)
Glucose, Bld: 84 mg/dL (ref 65–99)
Potassium: 4.5 mmol/L (ref 3.5–5.1)
Sodium: 138 mmol/L (ref 135–145)

## 2015-01-24 LAB — CBC
HCT: 22.2 % — ABNORMAL LOW (ref 40.0–52.0)
HEMOGLOBIN: 7.3 g/dL — AB (ref 13.0–18.0)
MCH: 29.2 pg (ref 26.0–34.0)
MCHC: 32.8 g/dL (ref 32.0–36.0)
MCV: 89.2 fL (ref 80.0–100.0)
Platelets: 13 10*3/uL — CL (ref 150–440)
RBC: 2.49 MIL/uL — AB (ref 4.40–5.90)
RDW: 17.6 % — ABNORMAL HIGH (ref 11.5–14.5)
WBC: 12.1 10*3/uL — ABNORMAL HIGH (ref 3.8–10.6)

## 2015-01-24 LAB — PREPARE RBC (CROSSMATCH)

## 2015-01-24 LAB — RENAL FUNCTION PANEL
ANION GAP: 11 (ref 5–15)
Albumin: 1.9 g/dL — ABNORMAL LOW (ref 3.5–5.0)
BUN: 23 mg/dL — AB (ref 6–20)
CALCIUM: 7.5 mg/dL — AB (ref 8.9–10.3)
CHLORIDE: 99 mmol/L — AB (ref 101–111)
CO2: 27 mmol/L (ref 22–32)
CREATININE: 3.78 mg/dL — AB (ref 0.61–1.24)
GFR calc Af Amer: 18 mL/min — ABNORMAL LOW (ref >60)
GFR calc non Af Amer: 15 mL/min — ABNORMAL LOW (ref >60)
GLUCOSE: 83 mg/dL (ref 65–99)
Phosphorus: 4.4 mg/dL (ref 2.5–4.6)
Potassium: 4.5 mmol/L (ref 3.5–5.1)
Sodium: 137 mmol/L (ref 135–145)

## 2015-01-24 LAB — PHOSPHORUS: PHOSPHORUS: 4.1 mg/dL (ref 2.5–4.6)

## 2015-01-24 MED ORDER — FLUCONAZOLE 200 MG PO TABS: 400.0000 mg | ORAL_TABLET | Freq: Every day | ORAL | Status: DC

## 2015-01-24 MED ORDER — SODIUM CHLORIDE 0.9 % IJ SOLN: 10.0000 mL | INTRAMUSCULAR | Status: DC | PRN

## 2015-01-24 MED ORDER — FLUCONAZOLE 200 MG PO TABS: 200.0000 mg | ORAL_TABLET | Freq: Every day | ORAL | Status: DC

## 2015-01-24 MED ORDER — SODIUM CHLORIDE 0.9 % IV SOLN: 250.0000 mL | Freq: Once | INTRAVENOUS | Status: DC

## 2015-01-24 MED ORDER — MOMETASONE FURO-FORMOTEROL FUM 100-5 MCG/ACT IN AERO: 2.0000 | INHALATION_SPRAY | Freq: Two times a day (BID) | RESPIRATORY_TRACT | Status: DC

## 2015-01-24 MED ORDER — AMIODARONE HCL 200 MG PO TABS
200.0000 mg | ORAL_TABLET | Freq: Every day | ORAL | Status: DC
Start: 2015-01-24 — End: 2015-01-24

## 2015-01-24 MED ORDER — LORAZEPAM 0.5 MG PO TABS: 0.5000 mg | ORAL_TABLET | Freq: Four times a day (QID) | ORAL | Status: AC | PRN

## 2015-01-24 MED ORDER — PANTOPRAZOLE SODIUM 40 MG PO TBEC: 40.0000 mg | DELAYED_RELEASE_TABLET | Freq: Two times a day (BID) | ORAL | Status: DC

## 2015-01-24 MED ORDER — ALBUTEROL SULFATE (2.5 MG/3ML) 0.083% IN NEBU: 2.5000 mg | INHALATION_SOLUTION | RESPIRATORY_TRACT | Status: DC | PRN

## 2015-01-24 MED ORDER — ACETAMINOPHEN 325 MG PO TABS
650.0000 mg | ORAL_TABLET | Freq: Once | ORAL | Status: AC
  Administered 2015-01-24: 650 mg via ORAL

## 2015-01-24 MED ORDER — MORPHINE SULFATE (CONCENTRATE) 10 MG /0.5 ML PO SOLN: 5.0000 mg | ORAL | Status: AC | PRN

## 2015-01-24 MED ORDER — SODIUM CHLORIDE 0.9 % IJ SOLN: 3.0000 mL | INTRAMUSCULAR | Status: DC | PRN

## 2015-01-24 MED ORDER — DRONABINOL 2.5 MG PO CAPS: 2.5000 mg | ORAL_CAPSULE | Freq: Two times a day (BID) | ORAL | Status: DC

## 2015-01-24 MED ORDER — DIPHENHYDRAMINE HCL 50 MG/ML IJ SOLN: 25.0000 mg | Freq: Once | INTRAMUSCULAR | Status: DC

## 2015-01-24 MED ORDER — MIDODRINE HCL 10 MG PO TABS: 10.0000 mg | ORAL_TABLET | Freq: Three times a day (TID) | ORAL | Status: DC

## 2015-01-24 NOTE — Progress Notes (Signed)
Called and discussed with Son.  He and his sister will be here at 1 PM for family meeting.

## 2015-01-24 NOTE — Progress Notes (Signed)
Spoke with Dr. Darvin Neighbours about pt to receive platelets but is to d/c.  MD gave verbal order to not give pt platelets. Pt also has a hemodialysis catheter that the MD states to leave in.  Clarise Cruz, RN

## 2015-01-24 NOTE — Clinical Social Work Note (Signed)
Per MD, family is scheduled for today at 1 PM. Per [name], pt has a chair time on Tuesday, 01/28/2015 at 11:30 AM. CSW updated [facility] regarding discharge plan. Facility will be ready to accept pt, once a dispo is determined. CSW will continue to follow.   Darden Dates, MSW, LCSW Clinical Social Worker  [phone]

## 2015-01-24 NOTE — Progress Notes (Signed)
[facility] Hematology/Oncology Progress Note  Date of admission: 11/29/2014  Hospital day:  01/24/2015   Chief Complaint: WEYLYN RICCIUTI is a 69 y.o. 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  Subjective:  Lying in bed after return from dialysis.  Minimally responsive.  Minimally able to follow commands.    Social History: The patient is alone today.  I spoke with his son, daughter, wife, and another gentleman today in a 19 minute family conference.  Allergies: No Known Allergies  Scheduled Medications:  Review of Systems: GENERAL:  Denies any complaint.  No fevers or sweats.  Weight loss of 10 pounds since admission.  Denies pain. PERFORMANCE STATUS (ECOG):  4 HEENT:  No visual changes, runny nose, sore throat, mouth sores or tenderness. Lungs: No shortness of breath or cough.  No hemoptysis. Cardiac:  No chest pain, palpitations, orthopnea, or PND. GI:  Appetite poor.  Minimal intake.  No nausea, vomiting, diarrhea, constipation, melena or hematochezia. No abdominal pain. GU:  Denies any symptoms. On chronic dialysis (Mondays, Wednesdays, and Fridays). Musculoskeletal:  No back or joint pain.  Extremities:  No pain or swelling. Skin:  Denies any rashes or skin changes. Neuro:  General weakness.  No headache, focal weakness or numbness. Pain:  No focal pain. Review of systems:  All other systems reviewed and found to be negative.  Physical Exam: Blood pressure 100/67, pulse 101, temperature 96.8 F (36 C), temperature source Axillary, resp. rate 21, height 6' (1.829 m), weight 156 lb 1.4 oz (70.8 kg), SpO2 99 %.  GENERAL: Chronically ill appearing gentleman lying in bed on the medical oncology unit in no acute distress. MENTAL STATUS:Alert but minimally interactive. HEAD: Temporal wasting.  Normocephalic, atraumatic, face symmetric, no Cushingoid  features. EYES: Brown eyes. Pupils equal round and reactive to light and accomodation. No conjunctivitis or scleral icterus. ENT: Oropharynx clear. Poor dentition.  RESPIRATORY: Decreased breath sounds at the bases.  No wheezes or rhonchi. CARDIOVASCULAR: Regular rate and rhythm without murmur, rub or gallop. ABDOMEN: Soft, minimal bowel sounds, and no hepatosplenomegaly. No tenderness. No guarding or rebound tenderness.  No masses. SKIN: No rashes, ulcers or lesions. EXTREMITIES: No skin discoloration or tenderness. No palpable cords. NEUROLOGICAL:  Follows some commands.  Results for orders placed or performed during the hospital encounter of 11/29/14 (from the past 48 hour(s))  Phosphorus     Status: Abnormal   Collection Time: 01/23/15  5:10 AM  Result Value Ref Range   Phosphorus 1.4 (L) 2.5 - 4.6 mg/dL  CBC     Status: Abnormal   Collection Time: 01/23/15  5:10 AM  Result Value Ref Range   WBC 12.0 (H) 3.8 - 10.6 K/uL   RBC 2.63 (L) 4.40 - 5.90 MIL/uL   Hemoglobin 7.7 (L) 13.0 - 18.0 g/dL   HCT 23.3 (L) 40.0 - 52.0 %   MCV 88.8 80.0 - 100.0 fL   MCH 29.2 26.0 - 34.0 pg   MCHC 32.9 32.0 - 36.0 g/dL   RDW 18.0 (H) 11.5 - 14.5 %   Platelets 15 (LL) 150 - 400 K/uL    Comment: PLATELET COUNT CONFIRMED BY SMEAR CRITICAL VALUE NOTED.  VALUE IS CONSISTENT WITH PREVIOUSLY REPORTED AND CALLED VALUE. RESULT REPEATED AND VERIFIED   Basic metabolic panel     Status: Abnormal   Collection Time: 01/23/15  5:10 AM  Result Value Ref Range  Sodium 138 135 - 145 mmol/L   Potassium 3.9 3.5 - 5.1 mmol/L   Chloride 97 (L) 101 - 111 mmol/L   CO2 28 22 - 32 mmol/L   Glucose, Bld 69 65 - 99 mg/dL   BUN 15 6 - 20 mg/dL   Creatinine, Ser 2.58 (H) 0.61 - 1.24 mg/dL   Calcium 7.7 (L) 8.9 - 10.3 mg/dL   GFR calc non Af Amer 24 (L) >60 mL/min   GFR calc Af Amer 28 (L) >60 mL/min    Comment: (NOTE) The eGFR has been calculated using the CKD EPI equation. This calculation has not  been validated in all clinical situations. eGFR's persistently <60 mL/min signify possible Chronic Kidney Disease.    Anion gap 13 5 - 15  Prepare Pheresed Platelets     Status: None (Preliminary result)   Collection Time: 01/23/15  6:20 PM  Result Value Ref Range   Unit Number [id number]    Blood Component Type PLTP LI1 PAS    Unit division 00    Status of Unit ALLOCATED    Transfusion Status OK TO TRANSFUSE   Type and screen     Status: None (Preliminary result)   Collection Time: 01/24/15  5:46 AM  Result Value Ref Range   ABO/RH(D) O POS    Antibody Screen NEG    Sample Expiration 01/27/2015    Unit Number [id number]    Blood Component Type RBC, LR IRR    Unit division 00    Status of Unit ISSUED    Transfusion Status OK TO TRANSFUSE    Crossmatch Result Compatible    Unit Number [id number]    Blood Component Type RBC, LR IRR    Unit division 00    Status of Unit ISSUED    Transfusion Status OK TO TRANSFUSE    Crossmatch Result Compatible   Phosphorus     Status: None   Collection Time: 01/24/15  5:47 AM  Result Value Ref Range   Phosphorus 4.1 2.5 - 4.6 mg/dL  CBC     Status: Abnormal   Collection Time: 01/24/15  5:47 AM  Result Value Ref Range   WBC 12.1 (H) 3.8 - 10.6 K/uL   RBC 2.49 (L) 4.40 - 5.90 MIL/uL   Hemoglobin 7.3 (L) 13.0 - 18.0 g/dL   HCT 22.2 (L) 40.0 - 52.0 %   MCV 89.2 80.0 - 100.0 fL   MCH 29.2 26.0 - 34.0 pg   MCHC 32.8 32.0 - 36.0 g/dL   RDW 17.6 (H) 11.5 - 14.5 %   Platelets 13 (LL) 150 - 440 K/uL    Comment: CRITICAL VALUE NOTED.  VALUE IS CONSISTENT WITH PREVIOUSLY REPORTED AND CALLED VALUE.  Prepare RBC     Status: None   Collection Time: 01/24/15  7:14 AM  Result Value Ref Range   Order Confirmation ORDER PROCESSED BY BLOOD BANK   Renal function panel     Status: Abnormal   Collection Time: 01/24/15 12:28 PM  Result Value Ref Range   Sodium 137 135 - 145 mmol/L   Potassium 4.5 3.5 - 5.1 mmol/L   Chloride 99 (L) 101 - 111  mmol/L   CO2 27 22 - 32 mmol/L   Glucose, Bld 83 65 - 99 mg/dL   BUN 23 (H) 6 - 20 mg/dL   Creatinine, Ser 3.78 (H) 0.61 - 1.24 mg/dL   Calcium 7.5 (L) 8.9 - 10.3 mg/dL   Phosphorus 4.4 2.5 - 4.6 mg/dL  Albumin 1.9 (L) 3.5 - 5.0 g/dL   GFR calc non Af Amer 15 (L) >60 mL/min   GFR calc Af Amer 18 (L) >60 mL/min    Comment: (NOTE) The eGFR has been calculated using the CKD EPI equation. This calculation has not been validated in all clinical situations. eGFR's persistently <60 mL/min signify possible Chronic Kidney Disease.    Anion gap 11 5 - 15  Basic metabolic panel     Status: Abnormal   Collection Time: 01/24/15 12:29 PM  Result Value Ref Range   Sodium 138 135 - 145 mmol/L   Potassium 4.5 3.5 - 5.1 mmol/L   Chloride 99 (L) 101 - 111 mmol/L   CO2 27 22 - 32 mmol/L   Glucose, Bld 84 65 - 99 mg/dL   BUN 23 (H) 6 - 20 mg/dL   Creatinine, Ser 3.84 (H) 0.61 - 1.24 mg/dL   Calcium 7.7 (L) 8.9 - 10.3 mg/dL   GFR calc non Af Amer 15 (L) >60 mL/min   GFR calc Af Amer 17 (L) >60 mL/min    Comment: (NOTE) The eGFR has been calculated using the CKD EPI equation. This calculation has not been validated in all clinical situations. eGFR's persistently <60 mL/min signify possible Chronic Kidney Disease.    Anion gap 12 5 - 15   No results found.  Assessment:  YOGESH COMINSKY is a 69 y.o. 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 He was admitted with Klebsiella sepsis due to pyelonephritis and associated altered mental status. He subsequently developed renal insufficiency with assciated hypernatremia. He was initially in the ICU then on the telemetry unit.  He has a history of ICU psychosis and altered mental status secondary to hypernatremia.  He developed progressive renal failure.  Dialysis was started on 12/18/2014.  He has remained on chronic dialysis.  Dialysis catheters have been exchanged secondary to fungemia.  His port-a-cath  was removed.  He had a platelet transfusion reaction on 12/06/2014. Blood cultures from 12/06/2014 grew staph auricularis (coag negative staph) . He had an upper GI bleed on 12/07/2014. He has received multiple PRBCs transfusions (last  01/20/2015).  His anemia is multifactorial.  He has been receiving Procrit. Platelet count normalized on 12/23/2014 and has slowly drifted down since 01/12/2015.  He has had no obvious bleeding.  WBC and ANC are adequate.  He is off Neupogen (GCSF).  He had an ileus.  A G tube and rectal tube were placed and removed.  He was temporarily on TPN.  His oral intake is minimal.  He has not gotten out of bed since admission.  Blood pressure has improved today since transfusion of PRBCs.  Plan: 1.  Hematology/Oncology: Day 6 s/p chemotherapy. I spoke with the family today regarding his progressive disease.  He is not a candidate for further chemotherapy given his performance status.  He is not a candidate for bone marrow transplant because he is unable to care for himself (performance status) and has progressive disease.  He continues to decline in health despite aggressive support.  I discussed Hospice as well as skilled care nursing.  They did not want to continue to pursue aggressive care, but to focus on comfort issues.  They did not want patient to continue dialysis.  They thought it would be better for him to go to skilled nursing outlining what interventions would be done in the future.  I discussed that without support, he has a very  limited life expectancy.  They understood.  Many questions were asked and answered.  A coordinated effort with the hospitalist on transfer was discussed.  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  He was transfused with 2 units of PRBCs during dialysis.  Platelet trnasfusion was ordered.  He will not be having labs drawn at the care facility.  2.  Disposition: Prognosis extremely poor.  Hospital  day 56.  Family decision to transfer patient to skilled nursing with supportive care. DNR code status.   Lequita Asal, MD  01/24/2015

## 2015-01-24 NOTE — Discharge Summary (Addendum)
[facility] NAME: Caleb Francis    MR#:  [id number]  DATE OF BIRTH:  12-07-1945  DATE OF ADMISSION:  11/29/2014 ADMITTING PHYSICIAN: Caleb Jewett, MD  DATE OF DISCHARGE: No discharge date for patient encounter.  PRIMARY CARE PHYSICIAN: No PCP Per Patient    ADMISSION DIAGNOSIS:  Metabolic encephalopathy [H37.16] UTI (lower urinary tract infection) [N39.0]  DISCHARGE DIAGNOSIS:  Principal Problem:   Fungemia Active Problems:   Mantle cell lymphoma   Sepsis   Thrombocytopenia   Absolute anemia   Rheumatoid arthritis with rheumatoid factor   Altered mental status   Chest pain   Metabolic encephalopathy   Ileus   Fecal impaction   Protein-calorie malnutrition, severe   Ogilvie's syndrome   Pseudo-obstruction of colon   Pressure ulcer   Acute renal insufficiency   SECONDARY DIAGNOSIS:   Past Medical History  Diagnosis Date  . Arthritis   . Hypertension   . RA (rheumatoid arthritis)   . Anemia   . Mantle cell lymphoma     Dr Caleb Francis  . History of nuclear stress test     a. 12/2013: low risk, no sig ischemia, no EKG changes, no artifact, EF 63%  . Chronic kidney disease   . Sepsis     Dr Caleb Francis     ADMITTING HISTORY  Caleb Francis is a 69 y.o. 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 He was in his normal state of health this morning when he presented for chemotherapy. During his chemotherapy infusion he became acutely confused and also complained of chest pain. He was brought to the emergency room where he is disoriented, agitated and evaluation shows a significant urinary tract infection.  I spoke by phone with his son who reports  that since his last admission he has been doing very well. At baseline he is alert and oriented 4. He has been working with physical therapy at [facility] and making progress. His son spoke to him last night and at that time he was at baseline. According to his oncologist at the beginning of his chemotherapy session this morning he was also at baseline.   HOSPITAL COURSE:   Please refer to detailed consults and progress notes from patients hospital stay.  * Mantle cell lymphoma Worsening.  Discussed with Dr. Nydia Francis and she feels patient is not a candidate for chemotherapy or BM transplant.  * sepsis: Fungemia with Candida parapsilosis blood cultures 7/15, and again on repeat culture 7/17 + and 7/21 Appreciate infectious disease consultation.  Fluconazole stopped at discharge per family discussion.  - Port-A-Cath removed on 7/21 by Dr. Lucky Francis. 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 Stopped at time of discharge  * Anemia of chronic disease.   * thrombocytopenia - worsening  Due to mantle cell lymphoma  * Chronic ileus with h/o GI bleeding Improved  * Severe protein calorie malnutrition: Continue Marinol. Dr. Manuella Francis discussed with family and the patient they would not be interested in PEG tube placement.   *  Encephalopathy:   * pressure ulcers: Wound care.  * left basilic vein superficial extensive thrombosis Was On eliquis 2.5 mg bid. Stopped due to thrombocytopenia.  Family meeting held today by Dr. Derwood Francis and later i discussed with family.  Family was told about his worsening Lymphoma and unable to get chemo or bone marrow transplant by Dr. Derwood Francis. Family has decided to transfer patient to SNF for PT. No HD. Hoping he  would do well. If any worsening transition to comfort measures/hospice home. I also discussed with family regarding stopping all medications except for pain and anxiety and they are in agreement.  Palliative care to follow patient at SNF  Permcath will be left in.   CONSULTS OBTAINED:  Treatment Team:  Caleb Legato, MD Caleb Mango, MD Caleb Sails, MD Caleb Sane, MD Caleb Prows, MD Caleb Huger, MD Caleb Alf, MD  DRUG ALLERGIES:  No Known Allergies  DISCHARGE MEDICATIONS:   Current Discharge Medication List    START taking these medications   Details  LORazepam (ATIVAN) 0.5 MG tablet Take 1 tablet (0.5 mg total) by mouth every 6 (six) hours as needed for anxiety. Qty: 30 tablet, Refills: 0    Morphine Sulfate (MORPHINE CONCENTRATE) 10 mg / 0.5 ml concentrated solution Take 0.25 mLs (5 mg total) by mouth every 4 (four) hours as needed for moderate pain, severe pain or shortness of breath. Qty: 15 mL, Refills: 0      CONTINUE these medications which have NOT CHANGED   Details  acetaminophen (TYLENOL) 325 MG tablet Take 2 tablets (650 mg total) by mouth every 6 (six) hours as needed for mild pain or fever. Qty: 30 tablet, Refills: 0   Associated Diagnoses: Mantle cell lymphoma      STOP taking these medications     amLODipine (NORVASC) 5 MG tablet      docusate sodium (COLACE) 100 MG capsule      folic acid (FOLVITE) 1 MG tablet      hydroxychloroquine (PLAQUENIL) 200 MG tablet      magnesium oxide (MAG-OX) 400 (241.3 MG) MG tablet      meloxicam (MOBIC) 7.5 MG tablet      metoprolol tartrate (LOPRESSOR) 25 MG tablet      oxybutynin (DITROPAN) 5 MG tablet      oxyCODONE-acetaminophen (PERCOCET/ROXICET) 5-325 MG per tablet      potassium chloride (K-DUR) 10 MEQ tablet      predniSONE (DELTASONE) 5 MG tablet      senna-docusate (SENOKOT-S) 8.6-50 MG per tablet      spironolactone (ALDACTONE) 25 MG tablet      allopurinol  (ZYLOPRIM) 300 MG tablet      alum & mag hydroxide-simeth (MAALOX/MYLANTA) 200-200-20 MG/5ML suspension      antiseptic oral rinse (CPC / CETYLPYRIDINIUM CHLORIDE 0.05%) 0.05 % LIQD solution      feeding supplement, ENSURE ENLIVE, (ENSURE ENLIVE) LIQD          Today    VITAL SIGNS:  Blood pressure 84/53, pulse 94, temperature 97.6 F (36.4 C), temperature source Axillary, resp. rate 19, height 6' (1.829 m), weight 70.8 kg (156 lb 1.4 oz), SpO2 100 %.  I/O:   Intake/Output Summary (Last 24 hours) at 01/24/15 1435 Last data filed at 01/24/15 0800  Gross per 24 hour  Intake      0 ml  Output      1 ml  Net     -1 ml    PHYSICAL EXAMINATION:  Physical Exam  GENERAL:  69 y.o.-year-old patient lying in the bed with no acute distress.  LUNGS: Clear CARDIOVASCULAR: S1, S2  ABDOMEN: Soft, non-tender. NEUROLOGIC: Moves all 4 extremities. PSYCHIATRIC: The patient is alert SKIN: No obvious rash, lesion, or ulcer.   DATA REVIEW:   CBC  Recent Labs Lab 01/24/15 0547  WBC 12.1*  HGB 7.3*  HCT 22.2*  PLT 13*    Chemistries   Recent Labs Lab 01/24/15 1228  NA 137  K 4.5  CL 99*  CO2 27  GLUCOSE 83  BUN 23*  CREATININE 3.78*  CALCIUM 7.5*    Cardiac Enzymes No results for input(s): TROPONINI in the last 168 hours.  Microbiology Results  Results for orders placed or performed during the hospital encounter of 11/29/14  MRSA PCR Screening     Status: None   Collection Time: 11/29/14 12:13 PM  Result Value Ref Range Status   MRSA by PCR NEGATIVE NEGATIVE Final    Comment:        The GeneXpert MRSA Assay (FDA approved for NASAL specimens only), is one component of a comprehensive MRSA colonization surveillance program. It is not intended to diagnose MRSA infection nor to guide or monitor treatment for MRSA infections.   Culture, blood (routine x 2)     Status: None   Collection Time: 11/29/14  1:19 PM  Result Value Ref Range Status   Specimen  Description BLOOD  Final   Special Requests Immunocompromised  Final   Culture NO GROWTH 5 DAYS  Final   Report Status 12/04/2014 FINAL  Final  Culture, blood (routine x 2)     Status: None   Collection Time: 11/29/14  3:04 PM  Result Value Ref Range Status   Specimen Description Blood  Final   Special Requests Immunocompromised  Final   Report Status 01/03/2015 FINAL  Final  Urine culture     Status: None   Collection Time: 11/30/14 10:12 AM  Result Value Ref Range Status   Specimen Description URINE, CATHETERIZED  Final   Special Requests Immunocompromised  Final   Culture NO GROWTH 2 DAYS  Final   Report Status 12/02/2014 FINAL  Final  Culture, blood (routine x 2)     Status: None   Collection Time: 12/06/14  8:08 PM  Result Value Ref Range Status   Specimen Description BLOOD  Final   Special Requests NONE  Final   Culture  Setup Time   Final    GRAM POSITIVE COCCI IN BOTH AEROBIC AND ANAEROBIC BOTTLES CRITICAL RESULT CALLED TO, READ BACK BY AND VERIFIED WITH: JENNIFER BEZARD AT 0932 12/08/14.PMH CONFIRMED BY R[facility]    Culture   Final    STAPHYLOCOCCUS AURICULARIS IN BOTH AEROBIC AND ANAEROBIC BOTTLES    Report Status 12/11/2014 FINAL  Final   Organism ID, Bacteria STAPHYLOCOCCUS AURICULARIS  Final      Susceptibility   Staphylococcus auricularis - MIC*    CIPROFLOXACIN >=8 RESISTANT Resistant     ERYTHROMYCIN >=8 RESISTANT Resistant     GENTAMICIN <=0.5 SENSITIVE Sensitive     OXACILLIN >=4 RESISTANT Resistant     TETRACYCLINE <=1 SENSITIVE Sensitive     VANCOMYCIN 1 SENSITIVE Sensitive     CLINDAMYCIN <=0.25 SENSITIVE Sensitive     TRIMETH/SULFA Value in next row Sensitive      SENSITIVE<=20    LEVOFLOXACIN Value in next row Intermediate      INTERMEDIATE4    * STAPHYLOCOCCUS AURICULARIS  Culture, blood (routine x 2)  Status: None   Collection Time: 12/06/14  8:40 PM  Result Value Ref Range Status   Specimen Description BLOOD  Final   Special Requests NONE   Final   Culture NO GROWTH 5 DAYS  Final   Report Status 12/11/2014 FINAL  Final  Culture, blood (routine x 2)     Status: None   Collection Time: 12/08/14 11:40 AM  Result Value Ref Range Status   Specimen Description BLOOD  Final   Special Requests Normal  Final   Culture NO GROWTH 6 DAYS  Final   Report Status 12/14/2014 FINAL  Final  Culture, blood (routine x 2)     Status: None   Collection Time: 12/08/14 11:49 AM  Result Value Ref Range Status   Specimen Description BLOOD  Final   Special Requests Normal  Final   Culture NO GROWTH 6 DAYS  Final   Report Status 12/14/2014 FINAL  Final  C difficile quick scan w PCR reflex ([facility] only)     Status: None   Collection Time: 12/14/14 10:11 AM  Result Value Ref Range Status   C Diff antigen NEGATIVE  Final   C Diff toxin NEGATIVE  Final   C Diff interpretation Negative for C. difficile  Final  Culture, blood (routine x 2)     Status: None   Collection Time: 12/27/14 11:28 AM  Result Value Ref Range Status   Specimen Description BLOOD  Final   Special Requests Immunocompromised  Final   Culture  Setup Time   Final    YEAST AEROBIC BOTTLE ONLY CRITICAL RESULT CALLED TO, READ BACK BY AND VERIFIED WITH: CATHY SUMMERLIN ON 12/29/14 AT 1000AM BY JEF    Culture CANDIDA PARAPSILOSIS AEROBIC BOTTLE ONLY   Final   Report Status 01/02/2015 FINAL  Final  Culture, blood (routine x 2)     Status: None   Collection Time: 12/27/14 11:36 AM  Result Value Ref Range Status   Specimen Description BLOOD  Final   Special Requests Immunocompromised  Final   Culture  Setup Time   Final    YEAST AEROBIC BOTTLE ONLY CRITICAL RESULT CALLED TO, READ BACK BY AND VERIFIED WITH: LISA ROMERO AT 0111 ON 12/29/14 RWW CONFIRMED BY [place]    Culture CANDIDA PARAPSILOSIS AEROBIC BOTTLE ONLY   Final   Report Status 01/02/2015 FINAL  Final  Cath Tip Culture     Status: None   Collection Time: 12/27/14  4:17 PM  Result Value Ref Range Status   Specimen  Description CATH TIP  Final   Special Requests NONE  Final   Culture NO GROWTH 2 DAYS  Final   Report Status 12/30/2014 FINAL  Final  Culture, blood (routine x 2)     Status: None   Collection Time: 12/29/14 12:28 PM  Result Value Ref Range Status   Specimen Description BLOOD RIGHT ARM  Final   Special Requests   Final    BOTTLES DRAWN AEROBIC AND ANAEROBIC  AER 4CC ANA 3CC   Culture  Setup Time   Final    BUDDING YEAST SEEN AEROBIC BOTTLE ONLY CRITICAL RESULT CALLED TO, READ BACK BY AND VERIFIED WITH: BROKE ROBERTSON AT 2230 01/01/15.TSH CONFIRMED BY SDR    Culture CANDIDA PARAPSILOSIS AEROBIC BOTTLE ONLY   Final   Report Status 01/04/2015 FINAL  Final  Culture, blood (routine x 2)     Status: None   Collection Time: 12/29/14 12:28 PM  Result Value Ref Range Status   Specimen Description  BLOOD RIGHT FATTY CASTS  Final   Special Requests BOTTLES DRAWN AEROBIC AND ANAEROBIC  5CC  Final   Culture  Setup Time   Final    YEAST AEROBIC BOTTLE ONLY CRITICAL VALUE NOTED.  VALUE IS CONSISTENT WITH PREVIOUSLY REPORTED AND CALLED VALUE.    Culture CANDIDA PARAPSILOSIS  Final   Report Status 01/04/2015 FINAL  Final  Cath Tip Culture     Status: None   Collection Time: 01/02/15 12:42 PM  Result Value Ref Range Status   Specimen Description CATH TIP  Final   Special Requests Normal  Final   Culture NO GROWTH 3 DAYS  Final   Report Status 01/05/2015 FINAL  Final  Culture, blood (routine x 2)     Status: None   Collection Time: 01/02/15  6:34 PM  Result Value Ref Range Status   Specimen Description BLOOD RIGHT HAND  Final   Special Requests 5 BOTTLES DRAWN AEROBIC AND ANAEROBIC 5ML  Final   Culture  Setup Time   Final    BUDDING YEAST SEEN ANAEROBIC BOTTLE ONLY CRITICAL RESULT CALLED TO, READ BACK BY AND VERIFIED WITH: IRIS GUIDRY AT 2030 01/04/15 SDR  CONFIRMED BY RW    Culture CANDIDA PARAPSILOSIS  Final   Report Status 01/07/2015 FINAL  Final  Culture, blood (routine x 2)      Status: None   Collection Time: 01/05/15  8:59 AM  Result Value Ref Range Status   Specimen Description BLOOD RIGHT ASSIST CONTROL  Final   Special Requests BOTTLES DRAWN AEROBIC AND ANAEROBIC 1ML  Final   Culture NO GROWTH 5 DAYS  Final   Report Status 01/10/2015 FINAL  Final  Culture, blood (routine x 2)     Status: None   Collection Time: 01/05/15  9:10 AM  Result Value Ref Range Status   Specimen Description BLOOD LEFT HAND  Final   Special Requests BOTTLES DRAWN AEROBIC AND ANAEROBIC 1ML  Final   Culture NO GROWTH 5 DAYS  Final   Report Status 01/10/2015 FINAL  Final  Cath Tip Culture     Status: None   Collection Time: 01/05/15 11:30 AM  Result Value Ref Range Status   Specimen Description CATH TIP  Final   Special Requests NONE  Final   Culture PSEUDOMONAS AERUGINOSA KLEBSIELLA OXYTOCA   Final   Report Status 01/08/2015 FINAL  Final   Organism ID, Bacteria PSEUDOMONAS AERUGINOSA  Final   Organism ID, Bacteria KLEBSIELLA OXYTOCA  Final      Susceptibility   Klebsiella oxytoca - MIC*    AMPICILLIN >=32 RESISTANT Resistant     CEFTAZIDIME <=1 SENSITIVE Sensitive     CEFAZOLIN >=64 RESISTANT Resistant     CEFTRIAXONE 8 SENSITIVE Sensitive     CIPROFLOXACIN <=0.25 SENSITIVE Sensitive     GENTAMICIN <=1 SENSITIVE Sensitive     IMIPENEM <=0.25 SENSITIVE Sensitive     TRIMETH/SULFA <=20 SENSITIVE Sensitive     PIP/TAZO Value in next row Resistant      RESISTANT>=128    * KLEBSIELLA OXYTOCA   Pseudomonas aeruginosa - MIC*    CEFTAZIDIME Value in next row Sensitive      RESISTANT>=128    CIPROFLOXACIN Value in next row Resistant      RESISTANT>=128    GENTAMICIN Value in next row Sensitive      RESISTANT>=128    IMIPENEM Value in next row Resistant      RESISTANT>=128    PIP/TAZO Value in next row Sensitive  SENSITIVE8    * PSEUDOMONAS AERUGINOSA    RADIOLOGY:  No results found.    Follow up with PCP in 1 week.  Management plans discussed with the patient,  family and they are in agreement.  CODE STATUS:     Code Status Orders        Start     Ordered   11/30/14 1029  Do not attempt resuscitation (DNR)   Continuous    Question Answer Comment  In the event of cardiac or respiratory ARREST Do not call a "code blue"   In the event of cardiac or respiratory ARREST Do not perform Intubation, CPR, defibrillation or ACLS   In the event of cardiac or respiratory ARREST Use medication by any route, position, wound care, and other measures to relive pain and suffering. May use oxygen, suction and manual treatment of airway obstruction as needed for comfort.      11/30/14 1028      TOTAL TIME TAKING CARE OF THIS PATIENT ON DAY OF DISCHARGE: more than 30 minutes.    Hillary Bow R M.D on 01/24/2015 at 2:35 PM  Between 7am to 6pm - Pager - [phone]  After 6pm go to [email or web address] - password [email or web address] [facility]  [facility]  Office  [phone]  CC: Primary care physician; No PCP Per Patient

## 2015-01-24 NOTE — Plan of Care (Signed)
Problem: Discharge Progression Outcomes Goal: Other Discharge Outcomes/Goals Outcome: Progressing Plan of care progress to goal: Diet - poor appetite, regular diet Activity - pt remains in bed Pain - norco given for pain in left foot

## 2015-01-24 NOTE — Progress Notes (Signed)
Subjective:  Overall patient's condition is about the same. His appetite remains poor. Due to his poor health condition, he is not a candidate for further chemotherapy. Platelet count is now critically low at 13. Hemoglobin keeps trending down to 7.3.   Objective:  Vital signs in last 24 hours:  Temp:  [97.6 F (36.4 C)-98.4 F (36.9 C)] 97.6 F (36.4 C) (08/12 1315) Pulse Rate:  [91-103] 97 (08/12 1400) Resp:  [18-31] 27 (08/12 1400) BP: (81-97)/(49-69) 97/52 mmHg (08/12 1400) SpO2:  [93 %-100 %] 100 % (08/12 1330) Weight:  [68.357 kg (150 lb 11.2 oz)-70.8 kg (156 lb 1.4 oz)] 70.8 kg (156 lb 1.4 oz) (08/12 1210)  Weight change: -1.843 kg (-4 lb 1 oz) Filed Weights   01/23/15 0642 01/24/15 0500 01/24/15 1210  Weight: 71.804 kg (158 lb 4.8 oz) 68.357 kg (150 lb 11.2 oz) 70.8 kg (156 lb 1.4 oz)    Intake/Output: I/O last 3 completed shifts: In: -  Out: 51 [Urine:50; Stool:1]   Intake/Output this shift:     Physical Exam: General: chronically ill appearing  Head: oral mucosa moist  Eyes: Anicteric  Neck: Supple, trachea midline  Lungs:  Clear to auscultation normal effort  Heart: S1S2 no rubs  Abdomen:  Soft, BS present, distended   Extremities: No LE edema  Neurologic: Awake, alert, oriented today  Skin: No acute rashes  Access: [facility] permcath  01/10/15 Dr. Lucky Cowboy    Basic Metabolic Panel:  Recent Labs Lab 01/20/15 0826  01/21/15 2006 01/22/15 1017 01/22/15 1019 01/23/15 0510 01/24/15 0547 01/24/15 1228  NA 138  --  134*  --  134* 138  --  137  K 6.1*  --  4.2  --  4.3 3.9  --  4.5  CL 103  --  96*  --  96* 97*  --  99*  CO2 24  --  25  --  24 28  --  27  GLUCOSE 82  --  115*  --  125* 69  --  83  BUN 35*  --  21*  --  25* 15  --  23*  CREATININE 4.35*  --  3.29*  --  3.70* 2.58*  --  3.78*  CALCIUM 8.2*  --  7.6*  --  7.5* 7.7*  --  7.5*  PHOS  --   < > <1.0* 1.6* 1.6* 1.4* 4.1 4.4  < > = values in this interval not displayed.  Liver Function  Tests:  Recent Labs Lab 01/21/15 2006 01/22/15 1019 01/24/15 1228  ALBUMIN 1.9* 1.9* 1.9*   No results for input(s): LIPASE, AMYLASE in the last 168 hours. No results for input(s): AMMONIA in the last 168 hours.  CBC:  Recent Labs Lab 01/21/15 1232 01/21/15 2006 01/22/15 1017 01/23/15 0510 01/24/15 0547  WBC 11.3* 12.3* 11.9* 12.0* 12.1*  HGB 8.1* 8.0* 7.5* 7.7* 7.3*  HCT 24.3* 24.5* 23.1* 23.3* 22.2*  MCV 89.0 88.7 89.7 88.8 89.2  PLT 26* 24* 18* 15* 13*    Cardiac Enzymes: No results for input(s): CKTOTAL, CKMB, CKMBINDEX, TROPONINI in the last 168 hours.  BNP: Invalid input(s): POCBNP  CBG: No results for input(s): GLUCAP in the last 168 hours.  Microbiology: Results for orders placed or performed during the hospital encounter of 11/29/14  MRSA PCR Screening     Status: None   Collection Time: 11/29/14 12:13 PM  Result Value Ref Range Status   MRSA by PCR NEGATIVE NEGATIVE Final    Comment:  The GeneXpert MRSA Assay (FDA approved for NASAL specimens only), is one component of a comprehensive MRSA colonization surveillance program. It is not intended to diagnose MRSA infection nor to guide or monitor treatment for MRSA infections.   Culture, blood (routine x 2)     Status: None   Collection Time: 11/29/14  1:19 PM  Result Value Ref Range Status   Specimen Description BLOOD  Final   Special Requests Immunocompromised  Final   Culture NO GROWTH 5 DAYS  Final   Report Status 12/04/2014 FINAL  Final  Culture, blood (routine x 2)     Status: None   Collection Time: 11/29/14  3:04 PM  Result Value Ref Range Status   Specimen Description Blood  Final   Special Requests Immunocompromised  Final   Report Status 01/03/2015 FINAL  Final  Urine culture     Status: None   Collection Time: 11/30/14 10:12 AM  Result Value Ref Range Status   Specimen Description URINE, CATHETERIZED  Final   Special Requests Immunocompromised  Final   Culture NO GROWTH 2  DAYS  Final   Report Status 12/02/2014 FINAL  Final  Culture, blood (routine x 2)     Status: None   Collection Time: 12/06/14  8:08 PM  Result Value Ref Range Status   Specimen Description BLOOD  Final   Special Requests NONE  Final   Culture  Setup Time   Final    GRAM POSITIVE COCCI IN BOTH AEROBIC AND ANAEROBIC BOTTLES CRITICAL RESULT CALLED TO, READ BACK BY AND VERIFIED WITH: JENNIFER BEZARD AT 5883 12/08/14.[facility] CONFIRMED BY [facility]    Culture   Final    STAPHYLOCOCCUS AURICULARIS IN BOTH AEROBIC AND ANAEROBIC BOTTLES    Report Status 12/11/2014 FINAL  Final   Organism ID, Bacteria STAPHYLOCOCCUS AURICULARIS  Final      Susceptibility   Staphylococcus auricularis - MIC*    CIPROFLOXACIN >=8 RESISTANT Resistant     ERYTHROMYCIN >=8 RESISTANT Resistant     GENTAMICIN <=0.5 SENSITIVE Sensitive     OXACILLIN >=4 RESISTANT Resistant     TETRACYCLINE <=1 SENSITIVE Sensitive     VANCOMYCIN 1 SENSITIVE Sensitive     CLINDAMYCIN <=0.25 SENSITIVE Sensitive     TRIMETH/SULFA Value in next row Sensitive      SENSITIVE<=20    LEVOFLOXACIN Value in next row Intermediate      INTERMEDIATE4    * STAPHYLOCOCCUS AURICULARIS  Culture, blood (routine x 2)     Status: None   Collection Time: 12/06/14  8:40 PM  Result Value Ref Range Status   Specimen Description BLOOD  Final   Special Requests NONE  Final   Culture NO GROWTH 5 DAYS  Final   Report Status 12/11/2014 FINAL  Final  Culture, blood (routine x 2)     Status: None   Collection Time: 12/08/14 11:40 AM  Result Value Ref Range Status   Specimen Description BLOOD  Final   Special Requests Normal  Final   Culture NO GROWTH 6 DAYS  Final   Report Status 12/14/2014 FINAL  Final  Culture, blood (routine x 2)     Status: None   Collection Time: 12/08/14 11:49 AM  Result Value Ref Range Status   Specimen Description BLOOD  Final   Special Requests Normal  Final   Culture NO GROWTH 6 DAYS  Final   Report Status 12/14/2014 FINAL  Final   C difficile quick scan w PCR reflex ([facility] only)  Status: None   Collection Time: 12/14/14 10:11 AM  Result Value Ref Range Status   C Diff antigen NEGATIVE  Final   C Diff toxin NEGATIVE  Final   C Diff interpretation Negative for C. difficile  Final  Culture, blood (routine x 2)     Status: None   Collection Time: 12/27/14 11:28 AM  Result Value Ref Range Status   Specimen Description BLOOD  Final   Special Requests Immunocompromised  Final   Culture  Setup Time   Final    YEAST AEROBIC BOTTLE ONLY CRITICAL RESULT CALLED TO, READ BACK BY AND VERIFIED WITH: CATHY SUMMERLIN ON 12/29/14 AT 1000AM BY JEF    Culture CANDIDA PARAPSILOSIS AEROBIC BOTTLE ONLY   Final   Report Status 01/02/2015 FINAL  Final  Culture, blood (routine x 2)     Status: None   Collection Time: 12/27/14 11:36 AM  Result Value Ref Range Status   Specimen Description BLOOD  Final   Special Requests Immunocompromised  Final   Culture  Setup Time   Final    YEAST AEROBIC BOTTLE ONLY CRITICAL RESULT CALLED TO, READ BACK BY AND VERIFIED WITH: LISA ROMERO AT 0111 ON 12/29/14 [facility] CONFIRMED BY [place]    Culture CANDIDA PARAPSILOSIS AEROBIC BOTTLE ONLY   Final   Report Status 01/02/2015 FINAL  Final  Cath Tip Culture     Status: None   Collection Time: 12/27/14  4:17 PM  Result Value Ref Range Status   Specimen Description CATH TIP  Final   Special Requests NONE  Final   Culture NO GROWTH 2 DAYS  Final   Report Status 12/30/2014 FINAL  Final  Culture, blood (routine x 2)     Status: None   Collection Time: 12/29/14 12:28 PM  Result Value Ref Range Status   Specimen Description BLOOD RIGHT ARM  Final   Special Requests   Final    BOTTLES DRAWN AEROBIC AND ANAEROBIC  AER 4CC ANA 3CC   Culture  Setup Time   Final    BUDDING YEAST SEEN AEROBIC BOTTLE ONLY CRITICAL RESULT CALLED TO, READ BACK BY AND VERIFIED WITH: BROKE ROBERTSON AT 2230 01/01/15.TSH CONFIRMED BY SDR    Culture CANDIDA PARAPSILOSIS AEROBIC  BOTTLE ONLY   Final   Report Status 01/04/2015 FINAL  Final  Culture, blood (routine x 2)     Status: None   Collection Time: 12/29/14 12:28 PM  Result Value Ref Range Status   Specimen Description BLOOD RIGHT FATTY CASTS  Final   Special Requests BOTTLES DRAWN AEROBIC AND ANAEROBIC  5CC  Final   Culture  Setup Time   Final    YEAST AEROBIC BOTTLE ONLY CRITICAL VALUE NOTED.  VALUE IS CONSISTENT WITH PREVIOUSLY REPORTED AND CALLED VALUE.    Culture CANDIDA PARAPSILOSIS  Final   Report Status 01/04/2015 FINAL  Final  Cath Tip Culture     Status: None   Collection Time: 01/02/15 12:42 PM  Result Value Ref Range Status   Specimen Description CATH TIP  Final   Special Requests Normal  Final   Culture NO GROWTH 3 DAYS  Final   Report Status 01/05/2015 FINAL  Final  Culture, blood (routine x 2)     Status: None   Collection Time: 01/02/15  6:34 PM  Result Value Ref Range Status   Specimen Description BLOOD RIGHT HAND  Final   Special Requests 5 BOTTLES DRAWN AEROBIC AND ANAEROBIC 5ML  Final   Culture  Setup Time  Final    BUDDING YEAST SEEN ANAEROBIC BOTTLE ONLY CRITICAL RESULT CALLED TO, READ BACK BY AND VERIFIED WITH: IRIS GUIDRY AT 2030 01/04/15 SDR  CONFIRMED BY RW    Culture CANDIDA PARAPSILOSIS  Final   Report Status 01/07/2015 FINAL  Final  Culture, blood (routine x 2)     Status: None   Collection Time: 01/05/15  8:59 AM  Result Value Ref Range Status   Specimen Description BLOOD RIGHT ASSIST CONTROL  Final   Special Requests BOTTLES DRAWN AEROBIC AND ANAEROBIC 1ML  Final   Culture NO GROWTH 5 DAYS  Final   Report Status 01/10/2015 FINAL  Final  Culture, blood (routine x 2)     Status: None   Collection Time: 01/05/15  9:10 AM  Result Value Ref Range Status   Specimen Description BLOOD LEFT HAND  Final   Special Requests BOTTLES DRAWN AEROBIC AND ANAEROBIC 1ML  Final   Culture NO GROWTH 5 DAYS  Final   Report Status 01/10/2015 FINAL  Final  Cath Tip Culture      Status: None   Collection Time: 01/05/15 11:30 AM  Result Value Ref Range Status   Specimen Description CATH TIP  Final   Special Requests NONE  Final   Culture PSEUDOMONAS AERUGINOSA KLEBSIELLA OXYTOCA   Final   Report Status 01/08/2015 FINAL  Final   Organism ID, Bacteria PSEUDOMONAS AERUGINOSA  Final   Organism ID, Bacteria KLEBSIELLA OXYTOCA  Final      Susceptibility   Klebsiella oxytoca - MIC*    AMPICILLIN >=32 RESISTANT Resistant     CEFTAZIDIME <=1 SENSITIVE Sensitive     CEFAZOLIN >=64 RESISTANT Resistant     CEFTRIAXONE 8 SENSITIVE Sensitive     CIPROFLOXACIN <=0.25 SENSITIVE Sensitive     GENTAMICIN <=1 SENSITIVE Sensitive     IMIPENEM <=0.25 SENSITIVE Sensitive     TRIMETH/SULFA <=20 SENSITIVE Sensitive     PIP/TAZO Value in next row Resistant      RESISTANT>=128    * KLEBSIELLA OXYTOCA   Pseudomonas aeruginosa - MIC*    CEFTAZIDIME Value in next row Sensitive      RESISTANT>=128    CIPROFLOXACIN Value in next row Resistant      RESISTANT>=128    GENTAMICIN Value in next row Sensitive      RESISTANT>=128    IMIPENEM Value in next row Resistant      RESISTANT>=128    PIP/TAZO Value in next row Sensitive      SENSITIVE8    * PSEUDOMONAS AERUGINOSA    Coagulation Studies:  Recent Labs  01/22/15 1017  LABPROT 21.9*  INR 1.89    Urinalysis: No results for input(s): COLORURINE, LABSPEC, PHURINE, GLUCOSEU, HGBUR, BILIRUBINUR, KETONESUR, PROTEINUR, UROBILINOGEN, NITRITE, LEUKOCYTESUR in the last 72 hours.  Invalid input(s): APPERANCEUR    Imaging: No results found.   Medications:     . sodium chloride  250 mL Intravenous Once  . sodium chloride  250 mL Intravenous Once  . acetaminophen  650 mg Oral Once  . amiodarone  200 mg Oral Daily  . antiseptic oral rinse  7 mL Mouth Rinse BID  . diphenhydrAMINE  25 mg Intravenous Once  . dronabinol  2.5 mg Oral BID AC  . epoetin (EPOGEN/PROCRIT) injection  10,000 Units Intravenous Q M,W,F-HD  . feeding  supplement (NEPRO CARB STEADY)  237 mL Oral BID BM  . fluconazole  200 mg Oral Once per day on Sun Tue Thu Sat  . fluconazole  400 mg  Oral Once per day on Mon Wed Fri  . midodrine  10 mg Oral TID WC  . mometasone-formoterol  2 puff Inhalation BID  . pantoprazole  40 mg Oral BID  . potassium & sodium phosphates  1 packet Oral BID  . scopolamine  1 patch Transdermal Q72H  . sodium chloride  3 mL Intravenous Q12H   sodium chloride, sodium chloride, acetaminophen **OR** acetaminophen, albuterol, anticoagulant sodium citrate, heparin, HYDROcodone-acetaminophen, lidocaine (PF), lidocaine-prilocaine, magnesium hydroxide, metoprolol, ondansetron (ZOFRAN) IV, promethazine, sodium chloride, sodium chloride, sodium chloride, sodium chloride  Assessment/ Plan:  Caleb Francis is a 89 black male with progressive mantle cell lymphoma, RICE chemo in 11/2014, hx left sided hydronephrosis and hydroureter. S/p ureteral stent placement   Hospital course complicated by sepsis with Klebsiella oxytoca, hypernatremia. Now with fungemia.   1. End Stage Renal Failure:  Acute renal failure due to ATN and now without recovery.  - Palliative care/discussion for hospice are underway. Patient has multitude of medical issues and terminal cancer. He is not a candidate for chemotherapy. As such, his life expectancy is very short.  - discharge plans are being made for [facility]  2.  Sepsis with fungemia. A41.9, B49. Status post bacteremia with klebsiella oxytoca: completed two weeks for IV ceftazidime. Subsequently growing candida parapsilosis.  Subcutaneous port removed 01/02/15. Catheter tip culture from July 24 is showing Pseudomonas, Klebsiella and Candida - Currently on fluconazole for 4 weeks. End on 8/21.   3.  Anemia of chronic kidney disease/mantle cell lymphoma: not candidate for chemo per Hem/onc. Hospice suggested - status post transfusion on 7/27, 7/30, 8/3, 8/7 - continue epogen  IV with HD,  use cleared by hematology/onc.  - Continue to monitor hemoglobin closely. Transfuse as necessary.   - Also noted to have thrombocytopenia - hematology assessment ongoing - No heparin during dialysis. We will pack the catheter with sodium citrate.  4. Secondary Hyperparathyroidism: PTH 71. Phos 1.6 - continue to monitor   LOS: 56 Caleb Francis 8/12/20162:09 PM

## 2015-01-24 NOTE — Progress Notes (Addendum)
Spoke with Dr. Darvin Neighbours to check that ok for pt to receive his blood transfusion while in dialysis today after lunch.  MD ok and agrees with this.  Clarise Cruz, RN

## 2015-01-24 NOTE — Discharge Instructions (Addendum)
°  DIET:  Regular diet  DISCHARGE CONDITION:  Fair  ACTIVITY:  Activity as tolerated  OXYGEN:  Home Oxygen: Yes.     Oxygen Delivery: 2 liters/min via Patient connected to nasal cannula oxygen  DISCHARGE LOCATION:  nursing home   If you experience worsening of your admission symptoms, develop shortness of breath, life threatening emergency, suicidal or homicidal thoughts you must seek medical attention immediately by calling 911 or calling your MD immediately  if symptoms less severe.  You Must read complete instructions/literature along with all the possible adverse reactions/side effects for all the Medicines you take and that have been prescribed to you. Take any new Medicines after you have completely understood and accpet all the possible adverse reactions/side effects.   Please note  You were cared for by a hospitalist during your hospital stay. If you have any questions about your discharge medications or the care you received while you were in the hospital after you are discharged, you can call the unit and asked to speak with the hospitalist on call if the hospitalist that took care of you is not available. Once you are discharged, your primary care physician will handle any further medical issues. Please note that NO REFILLS for any discharge medications will be authorized once you are discharged, as it is imperative that you return to your primary care physician (or establish a relationship with a primary care physician if you do not have one) for your aftercare needs so that they can reassess your need for medications and monitor your lab values.  Palliative care to see patient at SNF.  Family requests transition to Comfort [facility] or Hospice home if any worsening. No hospitalization. No Dialysis.

## 2015-01-24 NOTE — Care Management Important Message (Signed)
Important Message  Patient Details  Name: Caleb Francis MRN: [id number] Date of Birth: 01-04-1946   Medicare Important Message Given:  Yes-second notification given    [name] 01/24/2015, 10:49 AM

## 2015-01-24 NOTE — Progress Notes (Addendum)
Report call to [facility].  [name] IV removed and pt transported to [facility] via EMS. Clarise Cruz, RN

## 2015-01-24 NOTE — Progress Notes (Signed)
[facility] Hematology/Oncology Progress Note  Date of admission: 11/29/2014  Hospital day:  01/23/2015  Chief Complaint: Caleb Francis is a 69 y.o. 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  Subjective:  Sitting up in bed watching TV. Conversant.  Denies any complaint.  Notes poor appetite.  Only ate mashed potatoes and string beans today.  Sat up on side of bed for 3-4 minutes today.  Social History: The patient is alone today.  I spoke to the patient's son on the phone.  Discussed need for family conference.  Allergies: No Known Allergies  Scheduled Medications: . sodium chloride  250 mL Intravenous Once  . acetaminophen  650 mg Oral Once  . amiodarone  200 mg Oral Daily  . antiseptic oral rinse  7 mL Mouth Rinse BID  . diphenhydrAMINE  25 mg Oral Once  . dronabinol  2.5 mg Oral BID AC  . epoetin (EPOGEN/PROCRIT) injection  10,000 Units Intravenous Q M,W,F-HD  . feeding supplement (NEPRO CARB STEADY)  237 mL Oral BID BM  . fluconazole  200 mg Oral Once per day on Sun Tue Thu Sat  . fluconazole  400 mg Oral Once per day on Mon Wed Fri  . midodrine  10 mg Oral TID WC  . mometasone-formoterol  2 puff Inhalation BID  . pantoprazole  40 mg Oral BID  . potassium & sodium phosphates  1 packet Oral BID  . scopolamine  1 patch Transdermal Q72H  . sodium chloride  3 mL Intravenous Q12H   Review of Systems: GENERAL:  Denies any complaint.  No fevers or sweats.  Weight loss of 10 pounds since admission.  Denies pain. PERFORMANCE STATUS (ECOG):  4 HEENT:  No visual changes, runny nose, sore throat, mouth sores or tenderness. Lungs: No shortness of breath or cough.  No hemoptysis. Cardiac:  No chest pain, palpitations, orthopnea, or PND. GI:  Appetite poor.  Minimal intake.  No nausea, vomiting, diarrhea, constipation, melena or hematochezia. No abdominal  pain. GU:  Denies any symptoms. On chronic dialysis (Mondays, Wednesdays, and Fridays). Musculoskeletal:  No back or joint pain.  Extremities:  No pain or swelling. Skin:  Denies any rashes or skin changes. Neuro:  General weakness.  No headache, focal weakness or numbness. Pain:  No focal pain. Review of systems:  All other systems reviewed and found to be negative.  Physical Exam: Blood pressure 92/51, pulse 97, temperature 98.4 F (36.9 C), temperature source Oral, resp. rate 18, height 6' (1.829 m), weight 158 lb 4.8 oz (71.804 kg), SpO2 100 %.  GENERAL: Chronically ill appearing gentleman lying in bed on the medical oncology unit in no acute distress. MENTAL STATUS:Alert and oriented to person, place and time. HEAD: Temporal wasting.  Normocephalic, atraumatic, face symmetric, no Cushingoid features. EYES: Brown eyes. Pupils equal round and reactive to light and accomodation. No conjunctivitis or scleral icterus. ENT: Oropharynx clear. Poor dentition.  RESPIRATORY: Decreased breath sounds at the bases.  No wheezes or rhonchi. CARDIOVASCULAR: Regular rate and rhythm without murmur, rub or gallop. ABDOMEN: Soft, minimal bowel sounds, and no hepatosplenomegaly. No tenderness. No guarding or rebound tenderness.  No masses. SKIN: No rashes, ulcers or lesions. EXTREMITIES: Trace ankle edema.  No skin discoloration or tenderness. No palpable cords. NEUROLOGICAL:  Appropriate.  Results for orders placed or performed during the hospital encounter of 11/29/14 (from the past 48 hour(s))  Protime-INR     Status: Abnormal   Collection Time: 01/22/15 10:17 AM  Result Value Ref Range   Prothrombin Time 21.9 (H) 11.4 - 15.0 seconds   INR 1.89   Fibrinogen     Status: None   Collection Time: 01/22/15 10:17 AM  Result Value Ref Range   Fibrinogen 264 210 - 470 mg/dL  Heparin induced platelet Ab (HIT antibody)     Status: None   Collection Time: 01/22/15 10:17 AM  Result Value  Ref Range   Heparin Induced Plt Ab 0.156 0.000 - 0.400 OD    Comment: (NOTE) Performed At: [facility] [place], [place] [id number] Lindon Romp MD GY:[id number]   Lactate dehydrogenase     Status: Abnormal   Collection Time: 01/22/15 10:17 AM  Result Value Ref Range   LDH 506 (H) 98 - 192 U/L  Uric acid     Status: Abnormal   Collection Time: 01/22/15 10:17 AM  Result Value Ref Range   Uric Acid, Serum 4.1 (L) 4.4 - 7.6 mg/dL  CBC     Status: Abnormal   Collection Time: 01/22/15 10:17 AM  Result Value Ref Range   WBC 11.9 (H) 3.8 - 10.6 K/uL   RBC 2.57 (L) 4.40 - 5.90 MIL/uL   Hemoglobin 7.5 (L) 13.0 - 18.0 g/dL   HCT 23.1 (L) 40.0 - 52.0 %   MCV 89.7 80.0 - 100.0 fL   MCH 29.3 26.0 - 34.0 pg   MCHC 32.7 32.0 - 36.0 g/dL   RDW 18.2 (H) 11.5 - 14.5 %   Platelets 18 (LL) 150 - 440 K/uL    Comment: CRITICAL VALUE NOTED.  VALUE IS CONSISTENT WITH PREVIOUSLY REPORTED AND CALLED VALUE.  Phosphorus     Status: Abnormal   Collection Time: 01/22/15 10:17 AM  Result Value Ref Range   Phosphorus 1.6 (L) 2.5 - 4.6 mg/dL  Reticulocytes     Status: Abnormal   Collection Time: 01/22/15 10:17 AM  Result Value Ref Range   Retic Ct Pct 2.7 0.4 - 3.1 %   RBC. 2.57 (L) 4.40 - 5.90 MIL/uL   Retic Count, Manual 69.4 19.0 - 183.0 K/uL  Folate     Status: None   Collection Time: 01/22/15 10:17 AM  Result Value Ref Range   Folate 6.2 >5.9 ng/mL  Renal function panel     Status: Abnormal   Collection Time: 01/22/15 10:19 AM  Result Value Ref Range   Sodium 134 (L) 135 - 145 mmol/L   Potassium 4.3 3.5 - 5.1 mmol/L   Chloride 96 (L) 101 - 111 mmol/L   CO2 24 22 - 32 mmol/L   Glucose, Bld 125 (H) 65 - 99 mg/dL   BUN 25 (H) 6 - 20 mg/dL   Creatinine, Ser 3.70 (H) 0.61 - 1.24 mg/dL   Calcium 7.5 (L) 8.9 - 10.3 mg/dL   Phosphorus 1.6 (L) 2.5 - 4.6 mg/dL   Albumin 1.9 (L) 3.5 - 5.0 g/dL   GFR calc non Af Amer 16 (L) >60 mL/min   GFR calc Af Amer 18 (L) >60 mL/min     Comment: (NOTE) The eGFR has been calculated using the CKD EPI equation. This calculation has not been validated in all clinical situations. eGFR's persistently <60 mL/min signify possible Chronic Kidney Disease.    Anion gap 14 5 - 15  Vitamin B12     Status: Abnormal   Collection Time: 01/22/15 10:20 AM  Result Value Ref Range  Vitamin B-12 969 (H) 180 - 914 pg/mL    Comment: (NOTE) This assay is not validated for testing neonatal or myeloproliferative syndrome specimens for Vitamin B12 levels. Performed at [facility]   Phosphorus     Status: Abnormal   Collection Time: 01/23/15  5:10 AM  Result Value Ref Range   Phosphorus 1.4 (L) 2.5 - 4.6 mg/dL  CBC     Status: Abnormal   Collection Time: 01/23/15  5:10 AM  Result Value Ref Range   WBC 12.0 (H) 3.8 - 10.6 K/uL   RBC 2.63 (L) 4.40 - 5.90 MIL/uL   Hemoglobin 7.7 (L) 13.0 - 18.0 g/dL   HCT 23.3 (L) 40.0 - 52.0 %   MCV 88.8 80.0 - 100.0 fL   MCH 29.2 26.0 - 34.0 pg   MCHC 32.9 32.0 - 36.0 g/dL   RDW 18.0 (H) 11.5 - 14.5 %   Platelets 15 (LL) 150 - 400 K/uL    Comment: PLATELET COUNT CONFIRMED BY SMEAR CRITICAL VALUE NOTED.  VALUE IS CONSISTENT WITH PREVIOUSLY REPORTED AND CALLED VALUE. RESULT REPEATED AND VERIFIED   Basic metabolic panel     Status: Abnormal   Collection Time: 01/23/15  5:10 AM  Result Value Ref Range   Sodium 138 135 - 145 mmol/L   Potassium 3.9 3.5 - 5.1 mmol/L   Chloride 97 (L) 101 - 111 mmol/L   CO2 28 22 - 32 mmol/L   Glucose, Bld 69 65 - 99 mg/dL   BUN 15 6 - 20 mg/dL   Creatinine, Ser 2.58 (H) 0.61 - 1.24 mg/dL   Calcium 7.7 (L) 8.9 - 10.3 mg/dL   GFR calc non Af Amer 24 (L) >60 mL/min   GFR calc Af Amer 28 (L) >60 mL/min    Comment: (NOTE) The eGFR has been calculated using the CKD EPI equation. This calculation has not been validated in all clinical situations. eGFR's persistently <60 mL/min signify possible Chronic Kidney Disease.    Anion gap 13 5 - 15  Prepare Pheresed  Platelets     Status: None (Preliminary result)   Collection Time: 01/23/15  6:20 PM  Result Value Ref Range   Unit Number [id number]    Blood Component Type PLTP LI1 PAS    Unit division 00    Status of Unit ALLOCATED    Transfusion Status OK TO TRANSFUSE    No results found.  Assessment:  KELLAN RAFFIELD is a 69 y.o. male with relapsed cell lymphoma currently day 22 status post cycle #2 RICE chemotherapy. He was admitted with Klebsiella sepsis due to pyelonephritis and associated altered mental status. He subsequently developed renal insufficiency with assciated hypernatremia. He was initially in the ICU then on the telemetry unit.  He has a history of ICU psychosis and altered mental status secondary to hypernatremia.  He developed progressive renal failure.  Dialysis was started on 12/18/2014.  He has remained on chronic dialysis.  Dialysis catheters have been exchanged secondary to fungemia.  His port-a-cath was removed.  He had a platelet transfusion reaction on 12/06/2014. Blood cultures from 12/06/2014 grew staph auricularis (coag negative staph) . He had an upper GI bleed on 12/07/2014. He has received multiple PRBCs transfusions (last  01/20/2015).  His anemia is multifactorial.  He has been receiving Procrit. Platelet count normalized on 12/23/2014 and has slowly drifted down since 01/12/2015.  He has had no obvious bleeding.  WBC and ANC are adequate.  He is off Neupogen (GCSF).  He  had an ileus.  A G tube and rectal tube were placed and removed.  He was temporarily on TPN.  His oral intake is minimal.  He has not gotten out of bed since admission.  He was able to sit on the side of the bed for 3-4 minutes today.  His blood pressure has been low.  Plan: 1.  Hematology/Oncology: Day 53 s/p chemotherapy. Clinically he has progressive disease given the palpable enlarging left axillary lymph node.  He is not a candidate for further chemotherapy at this time given his performance  status.  I doubt that he will ever be a candidate for bone marrow transplant as he is unable to care for himself (performance status) and progressive disease.  I discussed this with his son today.  He is a candidate for Hospice.  He requested that I talk to his sister.  I suggested a family conference around noon on Friday.  He has anemia likely secondary to marrow replacement and anemia of chronic renal disease.  Type and screen ordered as likely will need transfusion again.  He is receiving Procrit 10,000 units with dialysis.  WBC is slightly elevated.  He has known circulating mantle cell lymphoma.  Platelet count has been decreasing steadily since 01/12/2015 likely secondary to progressive marrow disease.  Heparin induced thrombocytopenia (HIT) assay negative.  No evidence of DIC.  PT slightly elevated secondary to poor nutrition.  No current evidence of infection leading to increased consumption.    Patient remains transfusion dependent (last PRBC transfusion 01/20/2015).  Maintain platelets > 10,000-20,000.  Will ensure platelet transfusions given during day secondary to prior reactions.  Patient started on Eliquis on 00/34/9179 for left basilic vein superficial extensive thrombosis.  Eliquis on hold with platelet count < 30-50,000 secondary to increased risk of bleeding.  Premed all blood products with Tylenol and Benadryl.  2.  Infectious disease: Klebsiella oxytoca bacteremia treated.  Vancomycin completed for coag negative staph.  No evidence of endocarditis by echo. Blood culture from 12/27/2014, 12/29/2014, and 01/02/2015 revealed candida parapsilosis.  Port-a-cath and dialysis lines removed.  Patient on fluconazole for a total of 4 weeks from 1st negative culture (01/05/2015).  3.  Nephrology: Chronic renal insufficiency on dialysis on Mondays, Wednesdays, and Fridays.  Permcath placed on 01/10/2015.   4.  Gastroenterology: UGI bleed resolved. Ileus s/p rectal tube, discontinued 12/24/2014.   TPN started 12/12/2014.  Off TPN.  Poor appetite on Marinol.  Family and patient have no interest in PEG tube placement.  Patient has severe protein malnutrition.  Albumen 1.9.  He has lost 10 pounds since admission.  Patient has little desire to eat.  Calorie count stopped. Without adequate nutrition, patient will continue to dwindle.  5.  Cardiovascular:  Patient's blood pressure running low.  May become an issue with ongoing dialysis.  5.  Disposition: Prognosis extremely poor.  Hospital day 55.  He has not been out of bed (debilitated).  He was able to sit on the side of the bed today for 3-4 minutes.  He has participated minimally with physical therapy.  Given progressive adenopathy and declining counts, patient not a candidate for bone marrow transplant.  He is not a candidate for further chemotherapy given his performance status.  I spoke with the patient's son today.  Hopefully he and his sister will be able to meet tomorrow.  Short term plan is to go to a skilled nursing facility this week for rehabilitation. He is a Hospice candidate.  DNR code status.  Lequita Asal, MD  01/23/2015

## 2015-01-24 NOTE — Progress Notes (Signed)
PT Cancellation Note  Patient Details Name: Caleb Francis MRN: [id number] DOB: 01/13/1946   Cancelled Treatment:    Reason Eval/Treat Not Completed: Patient at procedure or test/unavailable  Pt at hemodialysis will re-attempt at later date   Londyn Wotton 01/24/2015, 1:30 PM  Edelmira Gallogly, PTA

## 2015-01-24 NOTE — Clinical Social Work Note (Signed)
Pt is ready for discharge today [employer]. Per MD, pt will return to SNF and will stop HD. Facility is able to accept pt this afternoon. CSW spoke with pt's son. CSW explained that pt is returning under his rehab benefit. Pt will have Palliative Care following. [name] son inquired about LTC and Hospice. [name] son will follow up with Vance Gather regarding Medicaid application. [name] family will also follow up with facility to transitioning pt to LTC once Medicaid is approved.   Facility has received discharge information and is ready to admit pt. RN will call report and EMS will provide transportation. CSW is signing off as no further needs identified.   Caleb Francis, MSW, LCSW Clinical Social Worker  [phone]

## 2015-01-26 LAB — TYPE AND SCREEN
ABO/RH(D): O POS
Antibody Screen: NEGATIVE
Unit division: 0
Unit division: 0

## 2015-01-26 LAB — PREPARE PLATELET PHERESIS: Unit division: 0

## 2015-02-06 LAB — GLUCOSE 6 PHOSPHATE DEHYDROGENASE
G6PDH: 9.8 U/g{Hb} (ref 4.6–13.5)
Hemoglobin: 10.6 g/dL — ABNORMAL LOW (ref 12.6–17.7)

## 2015-02-13 DEATH — deceased

## 2015-03-09 NOTE — Progress Notes (Signed)
[facility] day:  11/05/2014  Chief Complaint: Caleb Francis is an 69 y.o. male with mantle cell lymphoma who is seen for reassessment after interval hospitalization and initiation of cycle #1 RICE chemotherapy.  HPI: The patient was last seen in the medical oncology clinic on 10/24/2014.  At that time, he had significant left sided pain.  Exam revealed increasing adenopathy.  He was noted to have left sided hydronephrosis.  He was admitted to [facility] from 10/24/2014 until 11/01/2014.  He underwent left sided ureteral stent.  He had some electrolyte issues while hospitalized.  He developed sudden tachycardia while ambulating.  He was started on metoprolol for rate control.  Chest CT angiogram revealed no pulmonary embolism, but pneumonia.  He was started on Augmentin.  He states that he missed his appointment at [facility] yesterday. He notes that he feels terrible with abdominal pain in the left lower quadrant (no change). He is voiding well. He is eating "okay". He notes "knots on the side of my neck". He denies any fevers or sweats.  Past Medical History  Diagnosis Date  . Arthritis   . Hypertension   . RA (rheumatoid arthritis)   . Anemia   . Mantle cell lymphoma     Dr Mike Gip  . History of nuclear stress test     a. 12/2013: low risk, no sig ischemia, no EKG changes, no artifact, EF 63%  . Chronic kidney disease   . Sepsis     Dr Ola Spurr    Past Surgical History  Procedure Laterality Date  . Back surgery    . Fracture surgery      ankle  . Portacath placement    . Cystoscopy w/ ureteral stent placement Left 10/24/2014    Procedure: CYSTOSCOPY WITH RETROGRADE PYELOGRAM/URETERAL STENT PLACEMENT;  Surgeon: Irine Seal, MD;  Location: [facility] ORS;  Service: Urology;  Laterality: Left;  . Peripheral vascular catheterization N/A 01/02/2015    Procedure: Porta Cath Removal;  Surgeon: Algernon Huxley, MD;  Location: [facility];  Service:  Cardiovascular;  Laterality: N/A;  . Peripheral vascular catheterization Left 01/10/2015    Procedure: Dialysis/Perma Catheter Insertion;  Surgeon: Algernon Huxley, MD;  Location: [place];  Service: Cardiovascular;  Laterality: Left;    Family History  Problem Relation Age of Onset  . Cancer Mother     breast  . Cancer Father     bone cancer  . Colon cancer Neg Hx   . Liver disease Neg Hx     Social History:  reports that he quit smoking about 4 months ago. His smoking use included Cigarettes. He has a 30 pack-year smoking history. He does not have any smokeless tobacco history on file. He reports that he does not drink alcohol or use illicit drugs.  The patient is alone today.  Allergies: No Known Allergies  Current Medications: Current Outpatient Prescriptions  Medication Sig Dispense Refill  . acetaminophen (TYLENOL) 325 MG tablet Take 2 tablets (650 mg total) by mouth every 6 (six) hours as needed for mild pain or fever. (Patient not taking: Reported on 11/29/2014) 30 tablet 0  . LORazepam (ATIVAN) 0.5 MG tablet Take 1 tablet (0.5 mg total) by mouth every 6 (six) hours as needed for anxiety. 30 tablet 0  . Morphine Sulfate (MORPHINE CONCENTRATE) 10 mg / 0.5 ml concentrated solution Take 0.25 mLs (5 mg total) by mouth every 4 (four) hours as needed for moderate pain,  severe pain or shortness of breath. 15 mL 0   Current Facility-Administered Medications  Medication Dose Route Frequency Provider Last Rate Last Dose  . morphine 2 MG/ML injection 2 mg  2 mg Intravenous Q2H PRN Lequita Asal, MD   2 mg at 11/05/14 1422   Facility-Administered Medications Ordered in Other Visits  Medication Dose Route Frequency Provider Last Rate Last Dose  . 0.9 %  sodium chloride infusion   Intravenous Continuous Lequita Asal, MD 20 mL/hr at 11/06/14 1010 20 mL/hr at 11/06/14 1010  . 0.9 %  sodium chloride infusion   Intravenous Continuous Lequita Asal, MD   Stopped at  11/28/14 1500  . sodium chloride 0.9 % injection 10 mL  10 mL Intracatheter PRN Lequita Asal, MD   10 mL at 11/06/14 1011   Review of Systems:  GENERAL: Feels bad. No fevers, sweats or weight loss. PERFORMANCE STATUS (ECOG): 2 HEENT: No visual changes, runny nose, sore throat, mouth sores or tenderness. Lungs: No shortness of breath or cough. No hemoptysis. Cardiac: No chest pain, palpitations, orthopnea, or PND. GI: Left lower quadrant abdominal pain. No nausea, vomiting, diarrhea, constipation, melena or hematochezia. GU: Voiding well. No urgency, frequency, dysuria, or hematuria. Musculoskeletal: No lower back pain. No joint pain. No muscle tenderness. Extremities: No pain or swelling. Skin: No rashes or skin changes. Neuro: No headache, numbness or weakness, balance or coordination issues. Endocrine: No diabetes, thyroid issues, hot flashes or night sweats. Psych: No mood changes, depression or anxiety. Pain: Abdominal pain (left side). Review of systems: All other systems reviewed and found to be negative.  Physical Exam: Blood pressure 101/65, pulse 73, temperature 97.1 F (36.2 C), temperature source Tympanic, height 5\' 9"  (1.753 m), weight 164 lb 0.4 oz (74.4 kg). GENERAL: Well developed, well nourished, gentleman sitting comfortably in the exam room in no acute distress. MENTAL STATUS: Alert and oriented to person, place and time. HEAD: Short gray hair. Normocephalic, atraumatic, face symmetric, no Cushingoid features. EYES: Brown eyes. Pupils equal round and reactive to light and accomodation. No conjunctivitis or scleral icterus. ENT: Oropharynx clear without lesion. Tongue normal. Mucous membranes moist.  RESPIRATORY: Clear to auscultation without rales, wheezes or rhonchi. CARDIOVASCULAR: Regular rate and rhythm without murmur, rub or gallop. ABDOMEN: Soft, extensive fullness/ mass left lower quadrant extending to bladder. Tender to  palpation, but without guarding or rebound tenderness. Active bowel sounds, and no hepatosplenomegaly.  SKIN: No rashes, ulcers or lesions. EXTREMITIES: Left lower extremity edema improved). No skin discoloration or tenderness. No palpable cords. LYMPH NODES: Bilateral cervical and submandibular nodes 2-3 cm.  Left axillary node 8 cm. Left groin node 5 cm. No palpable supraclavicular adenopathy. NEUROLOGICAL: Unremarkable. PSYCH: Appropriate  Assessment:  Caleb Francis is a 69 year old African-American gentleman with a history of stage IIIB mantle cell lymphoma.  He did not have a baselline bone marrow.  He presented with extensive denopathy.  Right cervical lymph node biopsy on 03/13/2013 confirmed mantle cell lymphoma.   He received 6 cycles of Bendamustine/Rituxan from 03/15/2013 - 07/13/2013.  He received 2 additional infusions of Rituxan (08/10/2013 and 10/01/2013).  He had a partial response (75% decrease in adenopathy).  He then received 6 cycles of RCHOP from 10/22/2013 - 02/11/2014.  He had a dose adjustment in vincristine because of constipation and lower extremity neuropathy.    PET/CT on 03/14/2014 demonstrated a partial response with further significant but incomplete metabolic response to therapy in the neck, chest, abdomen, and  pelvis.  Chest, abdomen, and pelvic CT scan on 05/24/2014 revealed stable mild axillary adenopathy, no mediastinal or pulmonary masses, mild retroperitoneal and iliac pelvic adenopathy (1.2 cm) which had decreased in volume slightly.  He has a history of chronic hypokalemia of unclear etiology.  He has been taking his potassium "off and on".  He has renal insufficiency.  Renal ultrasound on 08/20/2014 revealed no hydronephrosis.  He began Ibrutinib in 02/2014.  PET scan on 09/16/2014 reveals early progressive disease (size, numer and hypermetabolism of lymph nodes).  Abdominal and pelvic CT scan on 10/23/2014 revealed progressive disease with  significant enlargement of diffuse retroperitoneal, bilateral pelvic, and left groin nodes.  There was diffuse edema in the left leg.  There was left hydronephrosis and hydroureter due to extrinsic compression by adenopathy.  He underwent left ureteral stent placement on 10/24/2014.  Symptomatically, he has chronic LLQ pain.  Exam reveals rapidly enlarging nodes.  Plan: 1.  Labs today:  CBC with diff, CMP, LDH, uric acid, hepatitis B core antibody, G6PD assay. 2.  RTC tomorrow to begin RICE chemotherapy.  Side effects reviewed.  Patient consented. 3.  Plan to split Rituxan over 2 days secondary to circulating mantle cell lymphoma and risk of a reaction. 4.  Pain meds:  Toradol and morphine. 5.  RTC on 05/28 for Neulasta. 6.  RTC in 10 days for MD assessment and labs (CBC with diff, BMP, uric acid).   Lequita Asal, MD  11/05/2014

## 2015-03-10 ENCOUNTER — Encounter: Payer: Self-pay | Admitting: Hematology and Oncology

## 2015-06-12 ENCOUNTER — Other Ambulatory Visit: Payer: Self-pay | Admitting: Nurse Practitioner

## 2015-07-11 LAB — TYPE AND SCREEN
ABO/RH(D): O POS
Antibody Screen: NEGATIVE
Unit division: 0
Unit division: 0
Unit division: 0
Unit division: 0

## 2015-09-05 IMAGING — CR DG ABDOMEN 2V
1 series · 2 of 2 positions shown · non-contrast
Comparison: 12/14/2014

CLINICAL DATA: Pseudo obstruction of the colon

EXAM:
ABDOMEN - 2 VIEW

[Series 1: ap · 0.17mm/px · 2 of 2 slices shown]
[im 1/2]
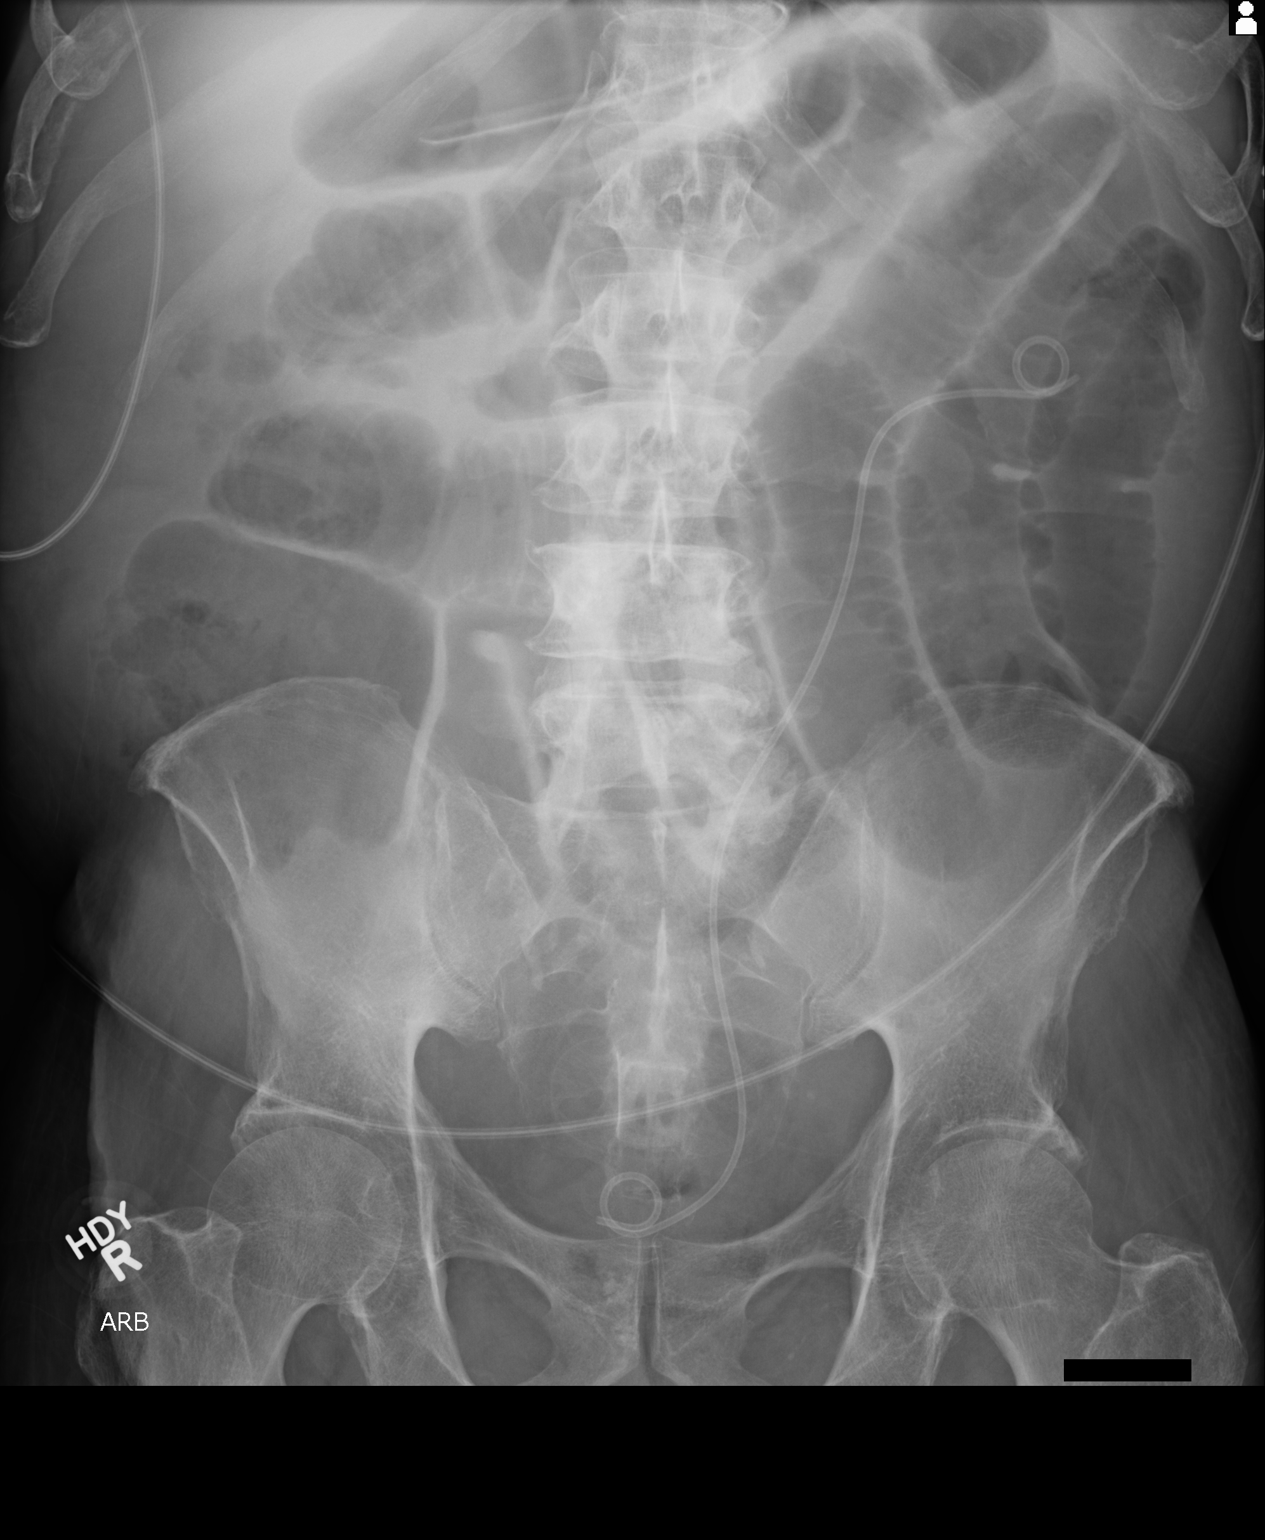
[im 2/2]
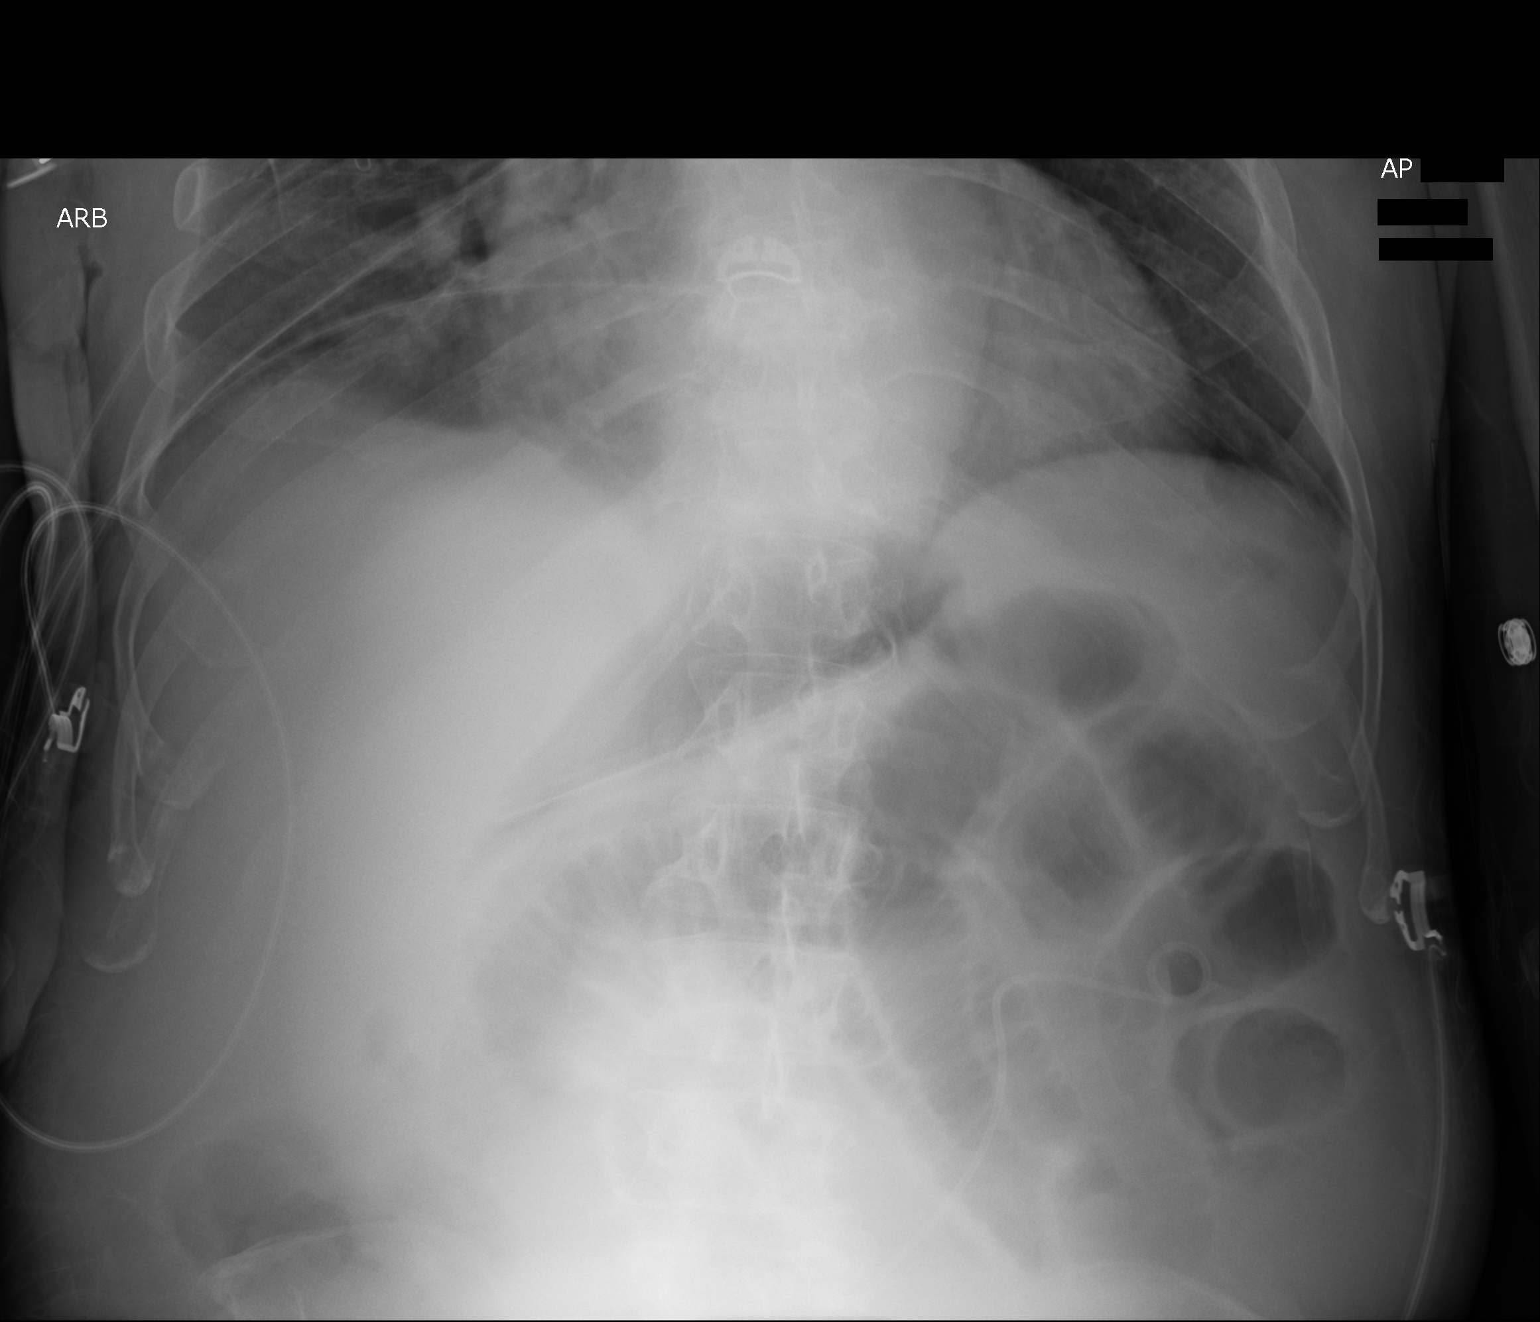

[2 of 2 positions shown; findings below may reference images not displayed]

FINDINGS: NG tube is in the stomach. There is a left ureteral stent remaining
in place. Increasing dilatation of small bowel loops throughout the
abdomen and upper pelvis. Continued mild gaseous distention of the
cecum. No free air organomegaly.
IMPRESSION: Increasing gaseous distention of small bowel loops, now moderately
dilated.

## 2015-09-06 IMAGING — CR DG ABDOMEN 2V
4 series · 4 of 4 positions shown · non-contrast
Comparison: Radiographs 12/15/2014

CLINICAL DATA: Followup abdominal distension.

EXAM:
ABDOMEN - 2 VIEW

[abdomen erect (1 of 2)]
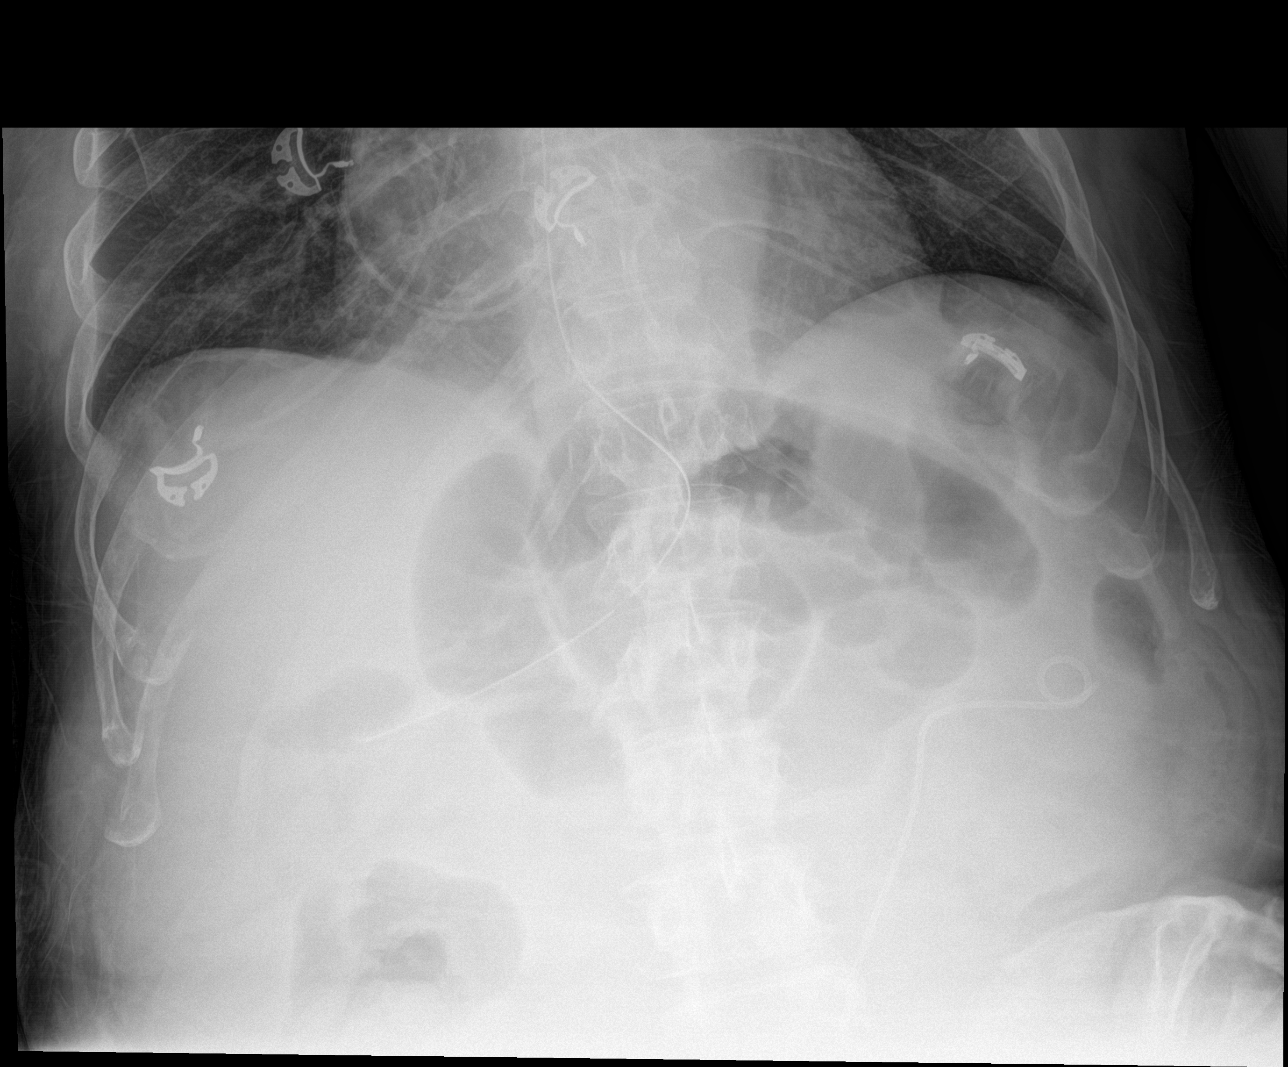

[abdomen erect (2 of 2)]
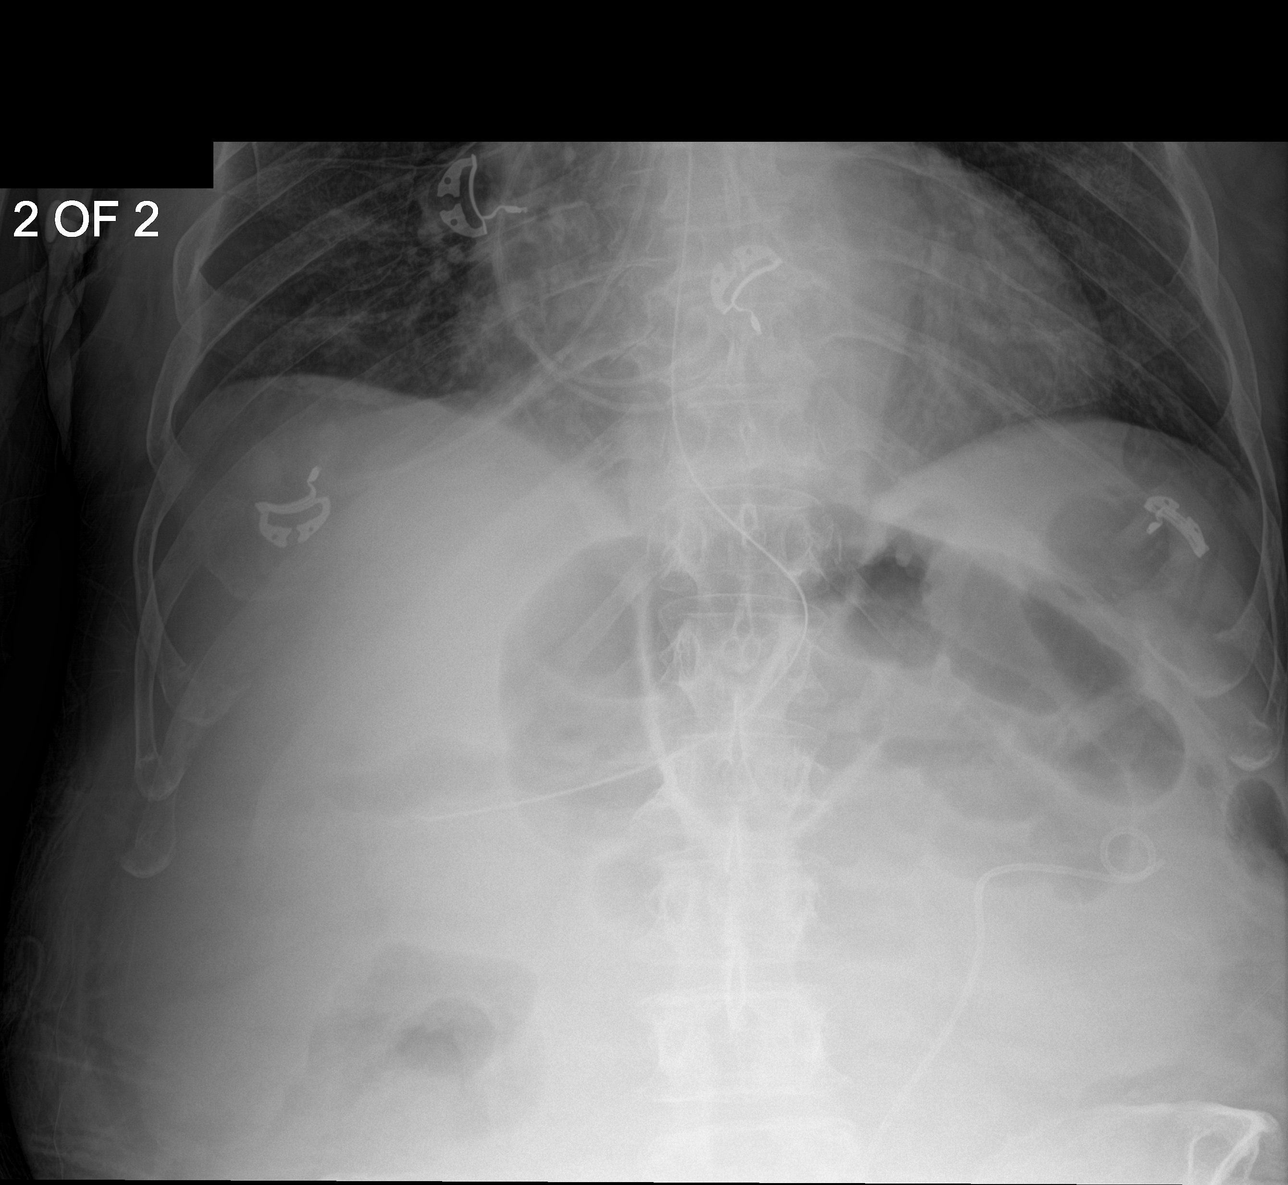

[abdomen supine (1 of 2)]
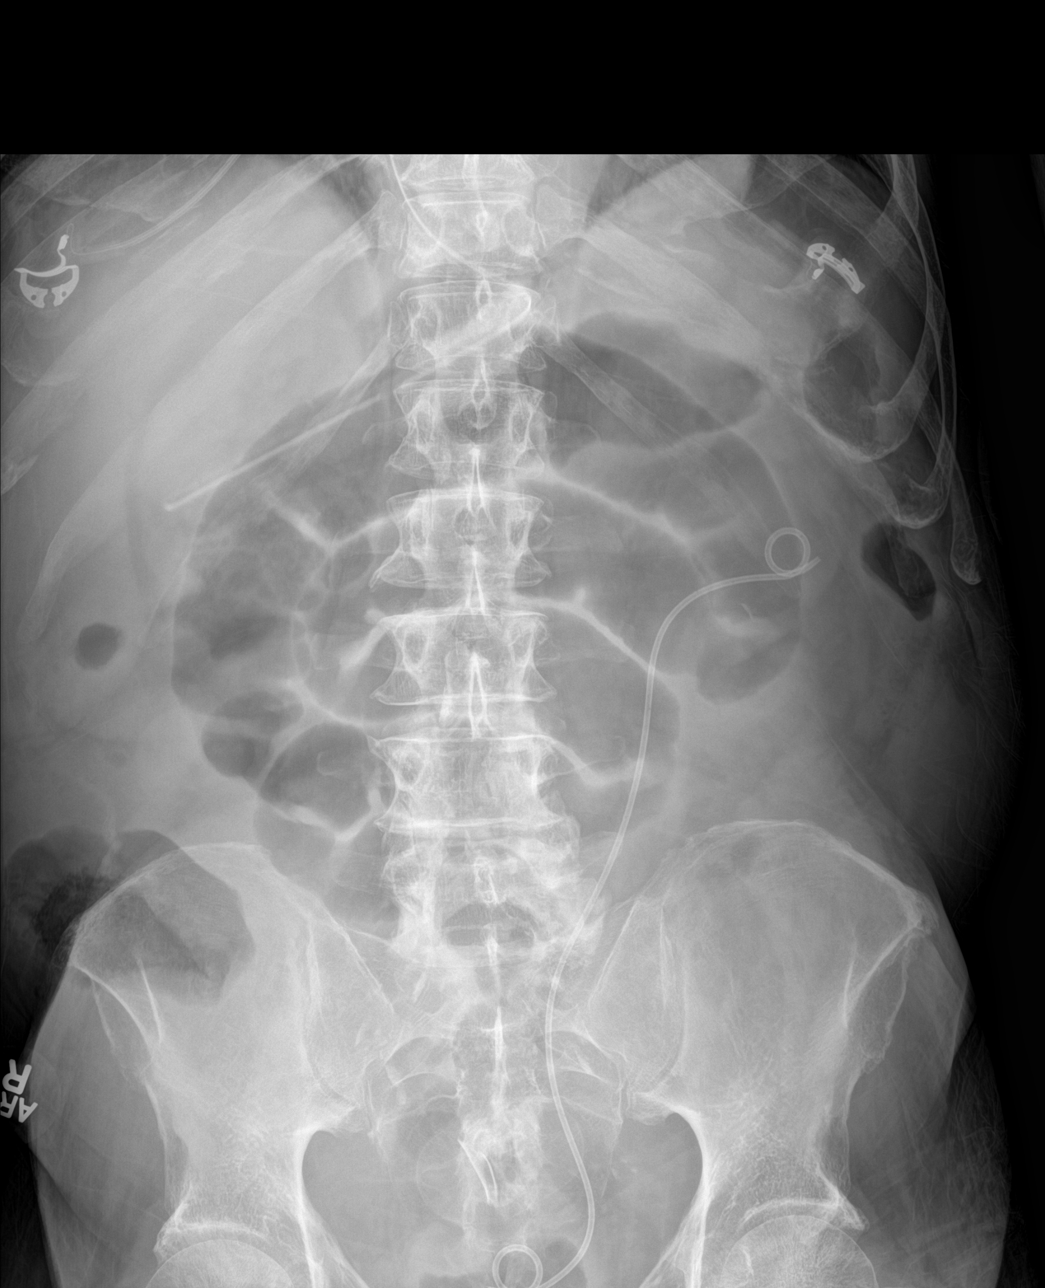

[abdomen supine (2 of 2)]
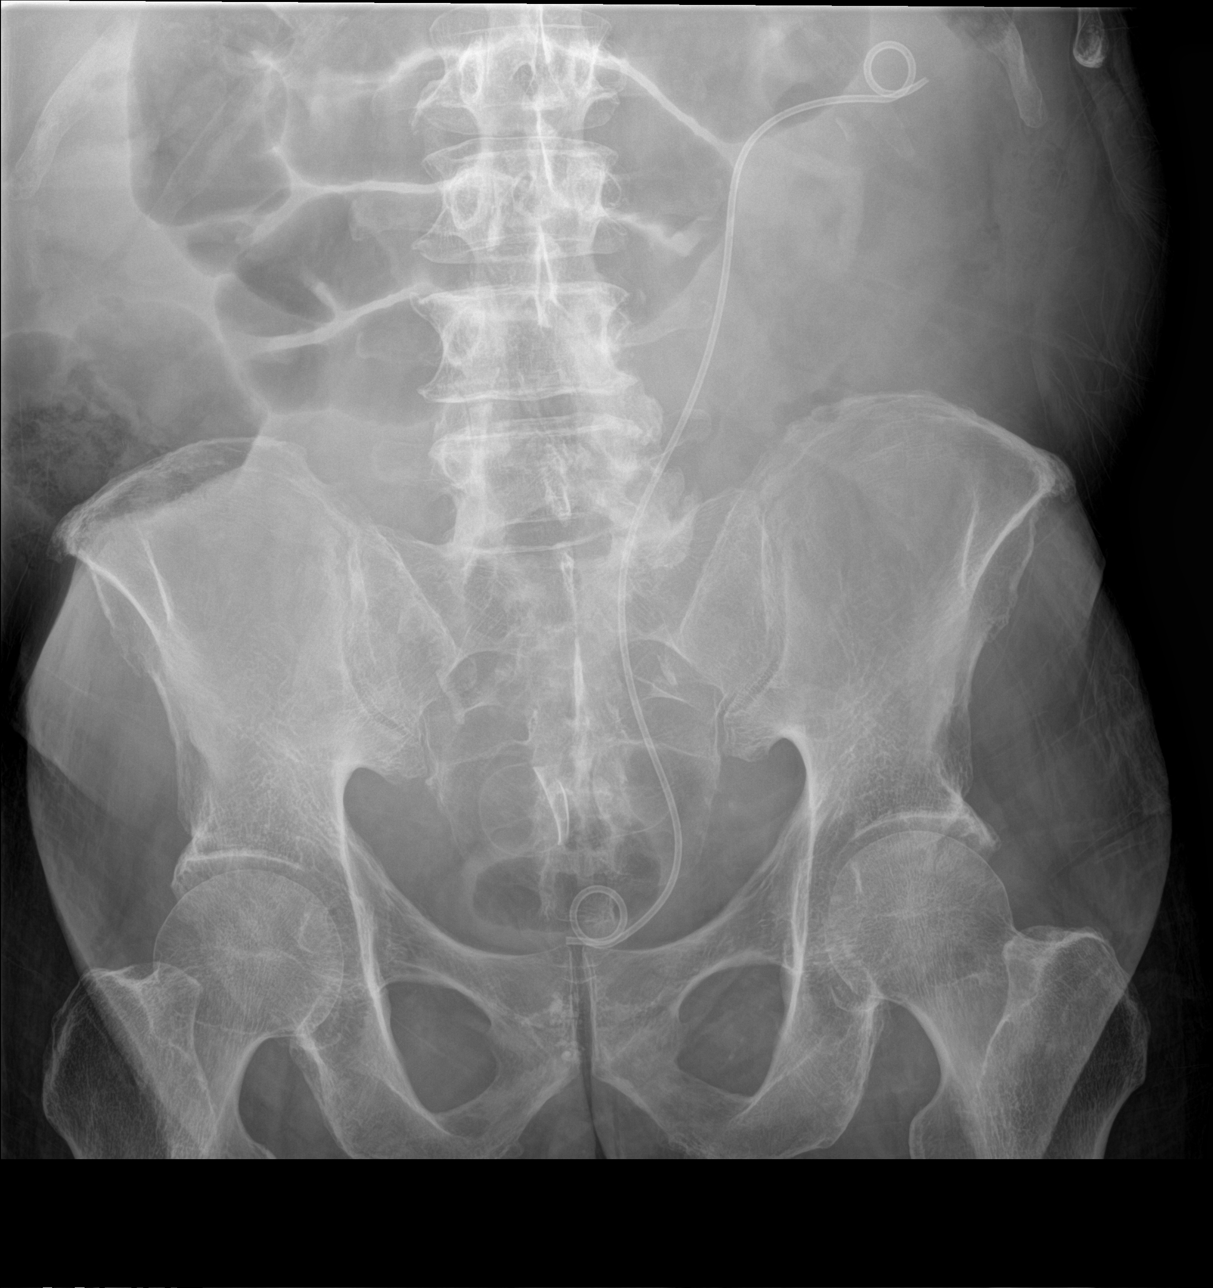

[4 of 4 positions shown; findings below may reference images not displayed]

FINDINGS: Improved bowel gas pattern with less distended bowel. No free air.
The left double-J ureteral stent is stable. The NG tube is stable.
IMPRESSION: Improved bowel gas pattern with less distended air-filled bowel.
